# Patient Record
Sex: Male | Born: 1955 | Race: White | Hispanic: No | Smoking: Never smoker
Health system: Southern US, Community
[De-identification: ages and names within clinical notes are randomized; demographics above are authoritative.]

## PROBLEM LIST (undated history)

## (undated) DIAGNOSIS — E785 Hyperlipidemia, unspecified: Secondary | ICD-10-CM

## (undated) DIAGNOSIS — E113599 Type 2 diabetes mellitus with proliferative diabetic retinopathy without macular edema, unspecified eye: Secondary | ICD-10-CM

## (undated) DIAGNOSIS — R531 Weakness: Secondary | ICD-10-CM

## (undated) DIAGNOSIS — C801 Malignant (primary) neoplasm, unspecified: Secondary | ICD-10-CM

## (undated) DIAGNOSIS — R569 Unspecified convulsions: Secondary | ICD-10-CM

## (undated) DIAGNOSIS — I10 Essential (primary) hypertension: Secondary | ICD-10-CM

## (undated) DIAGNOSIS — M1990 Unspecified osteoarthritis, unspecified site: Secondary | ICD-10-CM

## (undated) DIAGNOSIS — C140 Malignant neoplasm of pharynx, unspecified: Secondary | ICD-10-CM

## (undated) DIAGNOSIS — I639 Cerebral infarction, unspecified: Secondary | ICD-10-CM

## (undated) DIAGNOSIS — I4891 Unspecified atrial fibrillation: Secondary | ICD-10-CM

## (undated) HISTORY — DX: Unspecified atrial fibrillation: I48.91

## (undated) HISTORY — DX: Cerebral infarction, unspecified: I63.9

## (undated) HISTORY — DX: Type 2 diabetes mellitus with proliferative diabetic retinopathy without macular edema, unspecified eye: E11.3599

## (undated) HISTORY — DX: Weakness: R53.1

## (undated) HISTORY — PX: COLONOSCOPY: SHX174

## (undated) HISTORY — DX: Unspecified convulsions: R56.9

## (undated) HISTORY — DX: Malignant (primary) neoplasm, unspecified: C80.1

## (undated) HISTORY — DX: Unspecified osteoarthritis, unspecified site: M19.90

## (undated) HISTORY — DX: Malignant neoplasm of pharynx, unspecified: C14.0

## (undated) HISTORY — DX: Hyperlipidemia, unspecified: E78.5

## (undated) HISTORY — DX: Essential (primary) hypertension: I10

## (undated) HISTORY — PX: TONSILLECTOMY: SUR1361

## (undated) HISTORY — PX: OTHER SURGICAL HISTORY: SHX169

---

## 2003-06-23 ENCOUNTER — Emergency Department (HOSPITAL_COMMUNITY): Admission: EM | Admit: 2003-06-23 | Discharge: 2003-06-23 | Payer: Self-pay | Admitting: Emergency Medicine

## 2003-06-25 ENCOUNTER — Emergency Department (HOSPITAL_COMMUNITY): Admission: EM | Admit: 2003-06-25 | Discharge: 2003-06-25 | Payer: Self-pay | Admitting: Emergency Medicine

## 2008-02-26 ENCOUNTER — Emergency Department (HOSPITAL_COMMUNITY): Admission: EM | Admit: 2008-02-26 | Discharge: 2008-02-26 | Payer: Self-pay | Admitting: Emergency Medicine

## 2009-12-09 ENCOUNTER — Inpatient Hospital Stay (HOSPITAL_COMMUNITY): Admission: EM | Admit: 2009-12-09 | Discharge: 2009-12-20 | Payer: Self-pay | Admitting: Emergency Medicine

## 2009-12-09 ENCOUNTER — Emergency Department (HOSPITAL_COMMUNITY): Admission: EM | Admit: 2009-12-09 | Discharge: 2009-12-09 | Payer: Self-pay | Admitting: Family Medicine

## 2009-12-09 ENCOUNTER — Ambulatory Visit: Payer: Self-pay | Admitting: Cardiology

## 2009-12-09 ENCOUNTER — Ambulatory Visit: Payer: Self-pay | Admitting: Internal Medicine

## 2009-12-10 ENCOUNTER — Encounter (INDEPENDENT_AMBULATORY_CARE_PROVIDER_SITE_OTHER): Payer: Self-pay | Admitting: Neurology

## 2009-12-10 ENCOUNTER — Encounter: Payer: Self-pay | Admitting: Internal Medicine

## 2009-12-11 ENCOUNTER — Encounter: Payer: Self-pay | Admitting: Internal Medicine

## 2009-12-12 ENCOUNTER — Encounter (INDEPENDENT_AMBULATORY_CARE_PROVIDER_SITE_OTHER): Payer: Self-pay | Admitting: Neurology

## 2009-12-12 ENCOUNTER — Ambulatory Visit: Payer: Self-pay | Admitting: Surgery

## 2009-12-20 ENCOUNTER — Encounter: Payer: Self-pay | Admitting: Internal Medicine

## 2009-12-20 DIAGNOSIS — E785 Hyperlipidemia, unspecified: Secondary | ICD-10-CM | POA: Insufficient documentation

## 2009-12-20 DIAGNOSIS — I6350 Cerebral infarction due to unspecified occlusion or stenosis of unspecified cerebral artery: Secondary | ICD-10-CM | POA: Insufficient documentation

## 2009-12-20 DIAGNOSIS — I10 Essential (primary) hypertension: Secondary | ICD-10-CM | POA: Insufficient documentation

## 2009-12-20 DIAGNOSIS — I4891 Unspecified atrial fibrillation: Secondary | ICD-10-CM | POA: Insufficient documentation

## 2009-12-20 DIAGNOSIS — E119 Type 2 diabetes mellitus without complications: Secondary | ICD-10-CM | POA: Insufficient documentation

## 2009-12-20 DIAGNOSIS — I5020 Unspecified systolic (congestive) heart failure: Secondary | ICD-10-CM | POA: Insufficient documentation

## 2009-12-22 ENCOUNTER — Telehealth: Payer: Self-pay | Admitting: Internal Medicine

## 2009-12-22 ENCOUNTER — Encounter: Payer: Self-pay | Admitting: Internal Medicine

## 2009-12-22 ENCOUNTER — Ambulatory Visit: Payer: Self-pay | Admitting: Internal Medicine

## 2009-12-22 ENCOUNTER — Encounter: Payer: Self-pay | Admitting: Pharmacist

## 2009-12-22 LAB — CONVERTED CEMR LAB
Blood Glucose, Fingerstick: 145
INR: 2.3

## 2009-12-26 ENCOUNTER — Ambulatory Visit: Payer: Self-pay | Admitting: Internal Medicine

## 2009-12-26 LAB — CONVERTED CEMR LAB: INR: 2.7

## 2009-12-29 ENCOUNTER — Ambulatory Visit: Payer: Self-pay | Admitting: Cardiology

## 2010-01-02 ENCOUNTER — Ambulatory Visit: Payer: Self-pay | Admitting: Internal Medicine

## 2010-01-02 ENCOUNTER — Telehealth: Payer: Self-pay | Admitting: *Deleted

## 2010-01-02 ENCOUNTER — Telehealth: Payer: Self-pay | Admitting: Cardiology

## 2010-01-02 LAB — CONVERTED CEMR LAB: INR: 3.5

## 2010-01-03 ENCOUNTER — Ambulatory Visit: Payer: Self-pay | Admitting: Internal Medicine

## 2010-01-03 LAB — CONVERTED CEMR LAB
Blood Glucose, Fingerstick: 282
Hgb A1c MFr Bld: 11.1 %

## 2010-01-03 LAB — HM DIABETES FOOT EXAM

## 2010-01-04 ENCOUNTER — Telehealth: Payer: Self-pay | Admitting: *Deleted

## 2010-01-04 LAB — CONVERTED CEMR LAB
ALT: 33 units/L (ref 0–53)
AST: 22 units/L (ref 0–37)
Albumin: 4.5 g/dL (ref 3.5–5.2)
Alkaline Phosphatase: 70 units/L (ref 39–117)
BUN: 66 mg/dL — ABNORMAL HIGH (ref 6–23)
CO2: 22 meq/L (ref 19–32)
Calcium: 9.5 mg/dL (ref 8.4–10.5)
Chloride: 98 meq/L (ref 96–112)
Creatinine, Ser: 2.64 mg/dL — ABNORMAL HIGH (ref 0.40–1.50)
Glucose, Bld: 258 mg/dL — ABNORMAL HIGH (ref 70–99)
Potassium: 5.5 meq/L — ABNORMAL HIGH (ref 3.5–5.3)
Sodium: 135 meq/L (ref 135–145)
Total Bilirubin: 0.5 mg/dL (ref 0.3–1.2)
Total Protein: 6.8 g/dL (ref 6.0–8.3)

## 2010-01-05 ENCOUNTER — Ambulatory Visit: Payer: Self-pay | Admitting: Internal Medicine

## 2010-01-05 ENCOUNTER — Ambulatory Visit (HOSPITAL_COMMUNITY): Admission: RE | Admit: 2010-01-05 | Discharge: 2010-01-05 | Payer: Self-pay | Admitting: Internal Medicine

## 2010-01-05 DIAGNOSIS — E875 Hyperkalemia: Secondary | ICD-10-CM | POA: Insufficient documentation

## 2010-01-05 DIAGNOSIS — N179 Acute kidney failure, unspecified: Secondary | ICD-10-CM | POA: Insufficient documentation

## 2010-01-05 DIAGNOSIS — N189 Chronic kidney disease, unspecified: Secondary | ICD-10-CM | POA: Insufficient documentation

## 2010-01-05 LAB — CONVERTED CEMR LAB
BUN: 54 mg/dL — ABNORMAL HIGH (ref 6–23)
CO2: 27 meq/L (ref 19–32)
Calcium: 9.3 mg/dL (ref 8.4–10.5)
Chloride: 100 meq/L (ref 96–112)
Creatinine, Ser: 2.07 mg/dL — ABNORMAL HIGH (ref 0.40–1.50)
Glucose, Bld: 246 mg/dL — ABNORMAL HIGH (ref 70–99)
INR: 1.9
Potassium: 4.5 meq/L (ref 3.5–5.3)
Sodium: 136 meq/L (ref 135–145)

## 2010-01-09 ENCOUNTER — Ambulatory Visit: Payer: Self-pay | Admitting: Internal Medicine

## 2010-01-09 LAB — CONVERTED CEMR LAB
BUN: 32 mg/dL — ABNORMAL HIGH (ref 6–23)
CO2: 26 meq/L (ref 19–32)
Calcium: 9.6 mg/dL (ref 8.4–10.5)
Chloride: 99 meq/L (ref 96–112)
Creatinine, Ser: 1.74 mg/dL — ABNORMAL HIGH (ref 0.40–1.50)
Glucose, Bld: 476 mg/dL — ABNORMAL HIGH (ref 70–99)
INR: 3
Potassium: 5.4 meq/L — ABNORMAL HIGH (ref 3.5–5.3)
Sodium: 133 meq/L — ABNORMAL LOW (ref 135–145)

## 2010-01-16 ENCOUNTER — Ambulatory Visit: Payer: Self-pay | Admitting: Internal Medicine

## 2010-01-16 ENCOUNTER — Encounter: Payer: Self-pay | Admitting: Internal Medicine

## 2010-01-16 LAB — CONVERTED CEMR LAB: INR: 2.1

## 2010-01-17 ENCOUNTER — Encounter: Payer: Self-pay | Admitting: Internal Medicine

## 2010-01-17 LAB — CONVERTED CEMR LAB
BUN: 22 mg/dL (ref 6–23)
CO2: 28 meq/L (ref 19–32)
Calcium: 9.5 mg/dL (ref 8.4–10.5)
Chloride: 98 meq/L (ref 96–112)
Creatinine, Ser: 1.35 mg/dL (ref 0.40–1.50)
Glucose, Bld: 288 mg/dL — ABNORMAL HIGH (ref 70–99)
Potassium: 4.5 meq/L (ref 3.5–5.3)
Sodium: 136 meq/L (ref 135–145)

## 2010-01-20 ENCOUNTER — Telehealth: Payer: Self-pay | Admitting: Internal Medicine

## 2010-01-23 ENCOUNTER — Ambulatory Visit: Payer: Self-pay

## 2010-01-24 ENCOUNTER — Encounter: Payer: Self-pay | Admitting: Internal Medicine

## 2010-01-30 ENCOUNTER — Ambulatory Visit: Payer: Self-pay

## 2010-02-03 ENCOUNTER — Ambulatory Visit: Payer: Self-pay | Admitting: Cardiology

## 2010-02-06 ENCOUNTER — Ambulatory Visit: Payer: Self-pay

## 2010-02-10 ENCOUNTER — Ambulatory Visit: Payer: Self-pay | Admitting: Cardiology

## 2010-02-13 LAB — CONVERTED CEMR LAB
BUN: 30 mg/dL — ABNORMAL HIGH (ref 6–23)
CO2: 29 meq/L (ref 19–32)
Calcium: 9.3 mg/dL (ref 8.4–10.5)
Chloride: 98 meq/L (ref 96–112)
Creatinine, Ser: 1.5 mg/dL (ref 0.4–1.5)
GFR calc non Af Amer: 50.19 mL/min — ABNORMAL LOW (ref >60.00)
Glucose, Bld: 352 mg/dL — ABNORMAL HIGH (ref 70–99)
Potassium: 4.8 meq/L (ref 3.5–5.1)
Sodium: 135 meq/L (ref 135–145)

## 2010-02-19 DIAGNOSIS — I639 Cerebral infarction, unspecified: Secondary | ICD-10-CM

## 2010-02-19 HISTORY — DX: Cerebral infarction, unspecified: I63.9

## 2010-02-22 ENCOUNTER — Encounter: Payer: Self-pay | Admitting: Physician Assistant

## 2010-02-22 ENCOUNTER — Ambulatory Visit
Admission: RE | Admit: 2010-02-22 | Discharge: 2010-02-22 | Payer: Self-pay | Source: Home / Self Care | Attending: Physician Assistant | Admitting: Physician Assistant

## 2010-02-22 ENCOUNTER — Other Ambulatory Visit: Payer: Self-pay | Admitting: Physician Assistant

## 2010-02-22 LAB — BASIC METABOLIC PANEL
BUN: 28 mg/dL — ABNORMAL HIGH (ref 6–23)
CO2: 25 mEq/L (ref 19–32)
Calcium: 9 mg/dL (ref 8.4–10.5)
Chloride: 102 mEq/L (ref 96–112)
Creatinine, Ser: 1.9 mg/dL — ABNORMAL HIGH (ref 0.4–1.5)
GFR: 39.33 mL/min — ABNORMAL LOW (ref >60.00)
Glucose, Bld: 286 mg/dL — ABNORMAL HIGH (ref 70–99)
Potassium: 4.5 mEq/L (ref 3.5–5.1)
Sodium: 134 mEq/L — ABNORMAL LOW (ref 135–145)

## 2010-02-23 ENCOUNTER — Telehealth: Payer: Self-pay | Admitting: Cardiology

## 2010-02-27 ENCOUNTER — Telehealth (INDEPENDENT_AMBULATORY_CARE_PROVIDER_SITE_OTHER): Payer: Self-pay

## 2010-02-28 ENCOUNTER — Other Ambulatory Visit: Payer: Self-pay | Admitting: Cardiology

## 2010-02-28 ENCOUNTER — Encounter (HOSPITAL_COMMUNITY)
Admission: RE | Admit: 2010-02-28 | Discharge: 2010-03-03 | Payer: Self-pay | Source: Home / Self Care | Attending: Cardiology | Admitting: Cardiology

## 2010-02-28 ENCOUNTER — Encounter: Payer: Self-pay | Admitting: Cardiovascular Disease

## 2010-02-28 ENCOUNTER — Ambulatory Visit: Admission: RE | Admit: 2010-02-28 | Discharge: 2010-02-28 | Payer: Self-pay | Source: Home / Self Care

## 2010-02-28 LAB — BASIC METABOLIC PANEL
BUN: 32 mg/dL — ABNORMAL HIGH (ref 6–23)
CO2: 28 mEq/L (ref 19–32)
Calcium: 9.7 mg/dL (ref 8.4–10.5)
Chloride: 97 mEq/L (ref 96–112)
Creatinine, Ser: 1.4 mg/dL (ref 0.4–1.5)
GFR: 57.49 mL/min — ABNORMAL LOW (ref >60.00)
Glucose, Bld: 129 mg/dL — ABNORMAL HIGH (ref 70–99)
Potassium: 4.9 mEq/L (ref 3.5–5.1)
Sodium: 135 mEq/L (ref 135–145)

## 2010-03-02 ENCOUNTER — Encounter (INDEPENDENT_AMBULATORY_CARE_PROVIDER_SITE_OTHER): Payer: Self-pay | Admitting: *Deleted

## 2010-03-11 DIAGNOSIS — I4891 Unspecified atrial fibrillation: Secondary | ICD-10-CM

## 2010-03-11 DIAGNOSIS — I6350 Cerebral infarction due to unspecified occlusion or stenosis of unspecified cerebral artery: Secondary | ICD-10-CM

## 2010-03-11 DIAGNOSIS — Z7901 Long term (current) use of anticoagulants: Secondary | ICD-10-CM | POA: Insufficient documentation

## 2010-03-13 ENCOUNTER — Ambulatory Visit: Admission: RE | Admit: 2010-03-13 | Discharge: 2010-03-13 | Payer: Self-pay | Source: Home / Self Care

## 2010-03-13 LAB — CONVERTED CEMR LAB: INR: 1

## 2010-03-21 NOTE — Assessment & Plan Note (Signed)
Summary: diabetes/dmr  CBG Result 145 Comments this blood sugar is nearly fasting as he skipped lunch MEDICAL NUTRITION THERAPY start time: 3:30PM end time: 4:37 PM Assessment:patient is new to our practice and recently discharged form hospitalization for a stroke. He has had no diabvetes training, but verbalizes some knowledge of how to care for diabetes. Has friend who is a nurse who helped him learn to use his meter and he also thinks is willing to predraw insulin weekly if needed. Patient reports that his mother cooks his meals- mostly baked or broiled foods, lot's of vegetables, he is able to identify some carbs- starches and sweets.   [id number] per patient Medications:required lantus and novolog in hospital, has tolerated metfomrin in past  Blood sugars:reports 140s this morning, 145 at today's visit. unable to find A1C in echart, presented with blood sugar of 307 initially in hospital   24 hour recall: will do at next visit Exercise: will cover at next future meeting Labs:will reveiw when available  Diagnosis:  NB 1.1 - Food & Nutrition related knowledge deifict as related to diabetes diagnosis and need to control blood sugars as evidenced by patient quesions and report of having no prior knowledge  NB 1.4- self monitoring deifict as related to no prior knowledge  as evidenced by patient report.   Intervention:  1- Educated about insulin today per physician request/ patient unable to use vial and syringe due to stroke. can use insulin pen or have someone predraw insuln and he can do injection 2- Educated patient on basics of self monitoring  3- Educated on basics of meal planning 4- Discussed Medicare B coverage of diabetes supplies  Monitoring: brings meter to visits,  Evaluation:A1C, blood pressure, weight  Follow-up:1 week per patient    Complete Medication List: 1)  Aspirin 81 Mg Tbec (Aspirin) .... Take 1 tablet by mouth once a day 2)  Amiodarone Hcl 200 Mg Tabs  (Amiodarone hcl) .Marland Kitchen.. 1 tab by mouth twice daily for 2 weeks, then continue on 1 tab by mouth once daily 3)  Carvedilol 12.5 Mg Tabs (Carvedilol) .... Take 1 tablet by mouth two times a day 4)  Furosemide 40 Mg Tabs (Furosemide) .... Take 1 tablet by mouth once a day 5)  Lisinopril 40 Mg Tabs (Lisinopril) .... Take 1 tablet by mouth two times a day 6)  Pravastatin Sodium 40 Mg Tabs (Pravastatin sodium) .... Take 1 tablet by mouth once a day 7)  Spironolactone 25 Mg Tabs (Spironolactone) .... Take 1 tablet by mouth once a day 8)  Amlodipine Besylate 5 Mg Tabs (Amlodipine besylate) .... Take 1 tablet by mouth once a day 9)  Warfarin Sodium 5 Mg Tabs (Warfarin sodium) .... Take 2 tablets by mouth once daily on 11/1 and 11/2. then take as directed by dr. Elie Confer, after you are evaluated at the coumadin clinic. 10)  Novofine 32g X 6 Mm Misc (Insulin pen needle) .... Use with lantus and novolog flexpen, as directed. 11)  Truetrack Blood Glucose W/device Kit (Blood glucose monitoring suppl) .... Check blood glucose before each meal and hs 12)  Truetrack Test Strp (Glucose blood) .... Check blood glucose before each meal and hs 13)  Truetrack Glucose Control Low Liqd (Blood glucose calibration) .... Use as directed 14)  Metformin Hcl 500 Mg Tabs (Metformin hcl) .... Take 1 tablet by mouth two times a day  Other Orders: MNT/Initial Visit and Intervention, 15 minutes [id number])   Orders Added: 1)  MNT/Initial Visit and Intervention, 15 minutes [  [id number]] 

## 2010-03-21 NOTE — Assessment & Plan Note (Signed)
Summary: COU/CH  Anticoagulant Therapy Managed by: Dorene Grebe. Brian [name]  PharmD [place] Attending: Lynnae January MD, Benjamine Mola Indication 1: Atrial fibrillation Indication 2: Encounter for therapeutic drug monitoring  V58.83 Start date: 12/16/2009  Patient Assessment Reviewed by: Paulla Dolly PharmD  December 26, 2009 Medication review: verified warfarin dosage & schedule,verified previous prescription medications, verified doses & any changes, verified new medications, reviewed OTC medications, reviewed OTC health products-vitamins supplements etc Complications: none Dietary changes: none   Health status changes: none   Lifestyle changes: none   Recent/future hospitalizations: none   Recent/future procedures: none   Recent/future dental: none Patient Assessment Part 2:  Have you MISSED ANY DOSES or CHANGED TABLETS?  No missed Warfarin doses or changed tablets.  Have you had any BRUISING or BLEEDING ( nose or gum bleeds,blood in urine or stool)?  No reported bruising or bleeding in nose, gums, urine, stool.  Have you STARTED or STOPPED any MEDICATIONS, including OTC meds,herbals or supplements?  No other medications or herbal supplements were started or stopped.  Have you CHANGED your DIET, especially green vegetables,or ALCOHOL intake?  No changes in diet or alcohol intake.  Have you had any ILLNESSES or HOSPITALIZATIONS?  No reported illnesses or hospitalizations  Have you had any signs of CLOTTING?(chest discomfort,dizziness,shortness of breath,arms tingling,slurred speech,swelling or redness in leg)    No chest discomfort, dizziness, shortness of breath, tingling in arm, slurred speech, swelling, or redness in leg.     Treatment  Target INR: 2.0-3.0 INR: 2.7  Date: 12/26/2009 INR reflects regimen in: 2.7  New  Tablet strength: : 5mg  Regimen Out:     Sunday: 2 Tablet     Monday: 1 & 1/2 Tablet     Tuesday: 2 Tablet     Wednesday: 1 & 1/2 Tablet     Thursday: 2 Tablet    Friday: 1 & 1/2 Tablet     Saturday: 2 Tablet Total Weekly: 62.5mg /week mg  Next INR Due: 01/02/2010 Adjusted by: Dorene Grebe. Elie Confer III PharmD CACP   Return to anticoagulation clinic:  01/02/2010 Time of next visit: 1500    Allergies: No Known Drug Allergies

## 2010-03-21 NOTE — Assessment & Plan Note (Signed)
Summary: COU/CH  Anticoagulant Therapy Managed by: Dorene Grebe. Brian [name]  PharmD CACP PCP: [place] Attending: Lynnae January MD, [place] Indication 1: Atrial fibrillation Indication 2: Encounter for therapeutic drug monitoring  V58.83 Start date: 12/16/2009  Patient Assessment Reviewed by: Paulla Dolly PharmD  January 02, 2010 Medication review: verified warfarin dosage & schedule,verified previous prescription medications, verified doses & any changes, verified new medications, reviewed OTC medications, reviewed OTC health products-vitamins supplements etc Complications: none Dietary changes: none   Health status changes: none   Lifestyle changes: none   Recent/future hospitalizations: none   Recent/future procedures: none   Recent/future dental: none Patient Assessment Part 2:  Have you MISSED ANY DOSES or CHANGED TABLETS?  No missed Warfarin doses or changed tablets.  Have you had any BRUISING or BLEEDING ( nose or gum bleeds,blood in urine or stool)?  No reported bruising or bleeding in nose, gums, urine, stool.  Have you STARTED or STOPPED any MEDICATIONS, including OTC meds,herbals or supplements?  No other medications or herbal supplements were started or stopped.  Have you CHANGED your DIET, especially green vegetables,or ALCOHOL intake?  No changes in diet or alcohol intake.  Have you had any ILLNESSES or HOSPITALIZATIONS?  No reported illnesses or hospitalizations  Have you had any signs of CLOTTING?(chest discomfort,dizziness,shortness of breath,arms tingling,slurred speech,swelling or redness in leg)    No chest discomfort, dizziness, shortness of breath, tingling in arm, slurred speech, swelling, or redness in leg.     Treatment  Target INR: 2.0-3.0 INR: 3.5  Date: 01/02/2010 Regimen In:  62.5mg /week INR reflects regimen in: 3.5  New  Tablet strength: : 5mg  Regimen Out:     Sunday: 1 & 1/2 Tablet     Monday: 2 Tablet     Tuesday: 1 & 1/2  Tablet     Wednesday: 1 & 1/2 Tablet     Thursday: 2 Tablet      Friday: 1 & 1/2 Tablet     Saturday: 1 & 1/2 Tablet Total Weekly: 57.5mg /week mg  Next INR Due: 01/23/2010 Adjusted by: Dorene Grebe. Elie Confer III PharmD CACP   Return to anticoagulation clinic:  01/23/2010 Time of next visit: [id number]    Allergies: 1)  ! Codeine

## 2010-03-21 NOTE — Assessment & Plan Note (Signed)
Summary: DM TEACHING/CH   Vital Signs:  Patient profile:   55 year old male Height:      70 inches Weight:      197.2 pounds BMI:     28.40  Allergies: No Known Drug Allergies  MEDICAL NUTRITION THERAPY  Assessment: Patient is now taking metformin as orderd, chekcing blood sugar once daily- reports fasting blood sigar todaywas 103mg /dl. Eats heathy and wants to incorporte more fruits. skipped lunch again today. Does not get regular exercise.   [id number] per patient Medications: tolerating metfomrin with brealfast and lunch, suggested taking it with breakfast and dinner Blood sugars:reports 103s this morning 24 hour recall: will do at next visit Exercise: not doing concistently Labs:will reveiw when available  Diagnosis:  NB 1.1 - Food & Nutrition related knowledge deifict improved and needs reinforced  NB 1.4- self monitoring deifict improving, patient needs A1C and to remember to bring meter to all clinic appoitnments   Intervention:  1- Educated about guidelines for exercise and discussed ways to work that in 2-  Used goal setting with patient to assist with self identifying changes he can make to his food choices  3- Educated about standards of care for eye and foot care.  Monitoring: brings meter to visits,  Evaluation:A1C, blood pressure, weight  Follow-up:1 month    Complete Medication List:   Complete Medication List: 1)  Aspirin 81 Mg Tbec (Aspirin) .... Take 1 tablet by mouth once a day 2)  Amiodarone Hcl 200 Mg Tabs (Amiodarone hcl) .Marland Kitchen.. 1 tab by mouth twice daily for 2 weeks, then continue on 1 tab by mouth once daily 3)  Carvedilol 12.5 Mg Tabs (Carvedilol) .... Take 1 tablet by mouth two times a day 4)  Furosemide 40 Mg Tabs (Furosemide) .... Take 1 tablet by mouth once a day 5)  Lisinopril 40 Mg Tabs (Lisinopril) .... Take 1 tablet by mouth two times a day 6)  Pravastatin Sodium 40 Mg Tabs (Pravastatin sodium) .... Take 1 tablet by mouth once a  day 7)  Spironolactone 25 Mg Tabs (Spironolactone) .... Take 1 tablet by mouth once a day 8)  Amlodipine Besylate 5 Mg Tabs (Amlodipine besylate) .... Take 1 tablet by mouth once a day 9)  Warfarin Sodium 5 Mg Tabs (Warfarin sodium) .... Take 2 tablets by mouth once daily on 11/1 and 11/2. then take as directed by dr. Elie Confer, after you are evaluated at the coumadin clinic. 10)  Novofine 32g X 6 Mm Misc (Insulin pen needle) .... Use with lantus and novolog flexpen, as directed. 11)  Truetrack Blood Glucose W/device Kit (Blood glucose monitoring suppl) .... Check blood glucose before each meal and hs 12)  Truetrack Test Strp (Glucose blood) .... Check blood glucose before each meal and hs 13)  Truetrack Glucose Control Low Liqd (Blood glucose calibration) .... Use as directed 14)  Metformin Hcl 500 Mg Tabs (Metformin hcl) .... Take 1 tablet by mouth two times a day  Other Orders: MNT/Reassessment and Intervention, Individual, 15 Minutes [id number])   Orders Added: 1)  MNT/Reassessment and Intervention, Individual, 15 Minutes [phone]

## 2010-03-21 NOTE — Assessment & Plan Note (Signed)
Summary: PT COMING AT 2 PM/DS AND MD APPT  Anticoagulant Therapy Managed by: Dorene Grebe. Ceasar Lund  PharmD CACP PCP: [place] Attending: Verneda Skill MD Indication 1: Atrial fibrillation Indication 2: Encounter for therapeutic drug monitoring  V58.83 Start date: 12/16/2009  Patient Assessment Reviewed by: Paulla Dolly PharmD  January 05, 2010 Medication review: verified warfarin dosage & schedule,verified previous prescription medications, verified doses & any changes, verified new medications, reviewed OTC medications, reviewed OTC health products-vitamins supplements etc Complications: none Dietary changes: none   Health status changes: none   Lifestyle changes: none   Recent/future hospitalizations: none   Recent/future procedures: none   Recent/future dental: none Patient Assessment Part 2:  Have you MISSED ANY DOSES or CHANGED TABLETS?  No missed Warfarin doses or changed tablets.  Have you had any BRUISING or BLEEDING ( nose or gum bleeds,blood in urine or stool)?  No reported bruising or bleeding in nose, gums, urine, stool.  Have you STARTED or STOPPED any MEDICATIONS, including OTC meds,herbals or supplements?  No other medications or herbal supplements were started or stopped.  Have you CHANGED your DIET, especially green vegetables,or ALCOHOL intake?  No changes in diet or alcohol intake.  Have you had any ILLNESSES or HOSPITALIZATIONS?  No reported illnesses or hospitalizations  Have you had any signs of CLOTTING?(chest discomfort,dizziness,shortness of breath,arms tingling,slurred speech,swelling or redness in leg)    No chest discomfort, dizziness, shortness of breath, tingling in arm, slurred speech, swelling, or redness in leg.     Treatment  Target INR: 2.0-3.0 INR: 1.9  Date: 01/05/2010 Regimen In:  57.5mg /week INR reflects regimen in: 1.9  New  Tablet strength: : 5mg  Regimen Out:     Sunday: 1 & 1/2 Tablet     Monday: 2 Tablet     Tuesday: 1 & 1/2 Tablet     Wednesday: 2 Tablet     Thursday: 1 & 1/2 Tablet      Friday: 2 Tablet     Saturday: 1 & 1/2 Tablet Total Weekly: 60.0mg /week mg  Next INR Due: 01/09/2010 Adjusted by: Dorene Grebe. Elie Confer III PharmD CACP   Return to anticoagulation clinic:  01/09/2010 Time of next visit: 1000    Allergies: 1)  ! Codeine

## 2010-03-21 NOTE — Progress Notes (Signed)
  Phone Note Outgoing Call   Call placed by: [name], D. Jenetta Downer Summary of Call: This afternoon, I spoke with Dr. Elie Confer, who indicated that Mr. Sybrant had missed his [facility] Clinic appt at 11:00AM. Therefore, I placed  a call to Mr. Credit, who states he simply forgot about the appointment. I was able to arrange with Dr. Elie Confer (very kindly) an appointment today at 3:00Pm - which Mr. Legge states he will be able to attend.  During our discussion, Mr. Doehring also mentioned that he has been unable to afford his Lantus and Novolog because not covered by his insurance. I called his [place] on [employer], who indicate that he has medicare part A, and therefore, no drug coverage is available. The patient had already left for his Coumadin Clinic appointment by the time I was able to get this information.  I also spoke with Barnabas Harries, who very kindly agreed to fit Mr. Schoeff in for Diabetes Education, education on syringes and needles (with thoughts of likely need for insulin soon), and diabetic diet. This was explained to the patient. Butch Penny will forward message of statin and metformin to patient. Upon follow-up appt on 01/03/10, we can readdress insulin needs.

## 2010-03-21 NOTE — Miscellaneous (Signed)
Summary: Orders Update   Clinical Lists Changes  Medications: Changed medication from CRESTOR 20 MG TABS (ROSUVASTATIN CALCIUM) Take 1 tablet by mouth once a night to PRAVASTATIN SODIUM 40 MG TABS (PRAVASTATIN SODIUM) Take 1 tablet by mouth once a day - Signed Added new medication of METFORMIN HCL 500 MG TABS (METFORMIN HCL) Take 1 tablet by mouth two times a day - Signed Removed medication of NOVOLOG FLEXPEN 100 UNIT/ML SOLN (INSULIN ASPART) Inject 5 units subcutaneously 30 minutes before each meal - Signed Removed medication of LANTUS SOLOSTAR 100 UNIT/ML SOLN (INSULIN GLARGINE) Inject 14 units subcutaneously each night - Signed Rx of PRAVASTATIN SODIUM 40 MG TABS (PRAVASTATIN SODIUM) Take 1 tablet by mouth once a day;  #30 x 5;  Signed;  Entered by: [name] DO;  Authorized by: [name] DO;  Method used: Electronically to [facility] [phone]*, [address] [place], [place]  [address], Ph: [phone], Fax: [phone] Rx of METFORMIN HCL 500 MG TABS (METFORMIN HCL) Take 1 tablet by mouth two times a day;  #30 x 5;  Signed;  Entered by: [name] DO;  Authorized by: [name] DO;  Method used: Electronically to [facility] [phone]*, [address], [place], [place]  [address], Ph: [phone], Fax: [phone]    Prescriptions: METFORMIN HCL 500 MG TABS (METFORMIN HCL) Take 1 tablet by mouth two times a day  #30 x 5   Entered and Authorized by:   [name] DO   Signed by:   [name] DO on 12/22/2009   Method used:   Electronically to        [facility] [phone]* (retail)       [place], [place]  [address]       Ph: [phone]       Fax: [phone]   RxIDHS:[id number] PRAVASTATIN SODIUM 40 MG TABS (PRAVASTATIN SODIUM) Take 1 tablet by mouth once a day  #30 x 5   Entered and Authorized by:   [name] DO   Signed by:   [name] DO on 12/22/2009   Method used:   Electronically to        [facility] [phone]* (retail)       [address]       [place], [place]  [address]       Ph: [phone]       Fax: [phone]   RxID:   [id number]

## 2010-03-21 NOTE — Assessment & Plan Note (Signed)
Summary: Coumadin Clinic  Anticoagulant Therapy Managed by: Dorene Grebe. Ceasar Lund  PharmD [place] Attending: Verneda Skill MD Indication 1: Atrial fibrillation Indication 2: Encounter for therapeutic drug monitoring  V58.83 Start date: 12/16/2009  Patient Assessment Reviewed by: Paulla Dolly PharmD  December 22, 2009 Medication review: verified warfarin dosage & schedule,verified previous prescription medications, verified doses & any changes, verified new medications, reviewed OTC medications, reviewed OTC health products-vitamins supplements etc Complications: none Dietary changes: none   Health status changes: none   Lifestyle changes: none   Recent/future hospitalizations: yes  Details: Yes. Admitted to hospital on 28-Oct-11---See admission note. Recent/future procedures: none   Recent/future dental: none Patient Assessment Part 2:  Have you MISSED ANY DOSES or CHANGED TABLETS?  No missed Warfarin doses or changed tablets.  Have you had any BRUISING or BLEEDING ( nose or gum bleeds,blood in urine or stool)?  No reported bruising or bleeding in nose, gums, urine, stool.  Have you STARTED or STOPPED any MEDICATIONS, including OTC meds,herbals or supplements?  No other medications or herbal supplements were started or stopped.  Have you CHANGED your DIET, especially green vegetables,or ALCOHOL intake?  No changes in diet or alcohol intake.  Have you had any ILLNESSES or HOSPITALIZATIONS?  No reported illnesses or hospitalizations  Have you had any signs of CLOTTING?(chest discomfort,dizziness,shortness of breath,arms tingling,slurred speech,swelling or redness in leg)    No chest discomfort, dizziness, shortness of breath, tingling in arm, slurred speech, swelling, or redness in leg.     Treatment  Target INR: 2.0-3.0 INR: 2.3  Date: 12/22/2009 INR reflects regimen in: 2.3  New  Tablet strength: : 5mg  Regimen Out:     Sunday: 1 & 1/2 Tablet       Friday: 2 Tablet  Saturday: 2 Tablet  Next INR Due: 12/26/2009 Adjusted by: Dorene Grebe. Elie Confer III PharmD CACP   Return to anticoagulation clinic:  12/26/2009 Time of next visit: 1500

## 2010-03-21 NOTE — Discharge Summary (Signed)
Summary: Hospital Discharge Update    Hospital Discharge Update:  Date of Admission: 12/09/2009 Date of Discharge: 12/20/2009  Brief Summary:  Pt is a 55yo male with a PMHx of uncontrolled HTN, DMII who presented to the [facility] ED via EMS from [facility] where he was found to have a blood pressure of 250/150. Patient had presented to urgent care for evaluation of shortness of breath and palpitations that he had experienced for approximately 1 week. Once transported to [facility], he was found to be in Afib with RVR, and the ED started him on a Diltiazem and Nitro drip, which reduced his blood pressure to SBPs of 130. 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 During the rest of the hospital course, efforts were made towards allowing coumadin levels to become therapeutic, and further blood pressure and diabetes management. Patient discharged on 12/20/09 with Close follow-up with Dr. Elie Confer in [facility] and [place].  Labs needed at follow-up: Comprehensive metabolic panel  Problem list changes:  Added new problem of DIABETES MELLITUS, TYPE II, UNCONTROLLED (ICD-250.02) - Signed Added new problem of ATRIAL FIBRILLATION WITH RAPID VENTRICULAR RESPONSE (ICD-427.31) - Signed Added new problem of ESSENTIAL HYPERTENSION, BENIGN (ICD-401.1) - Signed Added new problem of HYPERLIPIDEMIA (ICD-272.4) - Signed Added new problem of CEREBROVASCULAR ACCIDENT (ICD-434.91) - Signed Added new problem of CONGESTIVE HEART FAILURE, SYSTOLIC DYSFUNCTION (A999333) - Signed  Medication list changes:  Added new medication of ASPIRIN 81 MG TBEC (ASPIRIN) Take 1 tablet by mouth once a day - Signed Added new medication of LANTUS SOLOSTAR 100 UNIT/ML SOLN (INSULIN GLARGINE) Inject 14 units subcutaneously each night - Signed Added new medication of AMIODARONE HCL 200 MG TABS (AMIODARONE HCL) 1 tab  by mouth twice daily for 2 weeks, then continue on 1 tab by mouth once daily - Signed Added new medication of CARVEDILOL 12.5 MG TABS (CARVEDILOL) Take 1 tablet by mouth two times a day - Signed Added new medication of FUROSEMIDE 40 MG TABS (FUROSEMIDE) Take 1 tablet by mouth once a day - Signed Added new medication of LISINOPRIL 40 MG TABS (LISINOPRIL) Take 1 tablet by mouth two times a day - Signed Added new medication of CRESTOR 20 MG TABS (ROSUVASTATIN CALCIUM) Take 1 tablet by mouth once a night - Signed Added new medication of SPIRONOLACTONE 25 MG TABS (SPIRONOLACTONE) Take 1 tablet by mouth once a day - Signed Added new medication of AMLODIPINE BESYLATE 5 MG TABS (AMLODIPINE BESYLATE) Take 1 tablet by mouth once a day - Signed Added new medication of WARFARIN SODIUM 5 MG TABS (WARFARIN SODIUM) take 2 tablets by mouth once daily on 11/1 and 11/2. Then take as directed by Dr. Elie Confer, after you are evaluated at the [facility]. - Signed Added new medication of NOVOLOG FLEXPEN 100 UNIT/ML SOLN (INSULIN ASPART) Inject 5 units subcutaneously 30 minutes before each meal - Signed Added new medication of NOVOFINE 32G X 6 MM MISC (INSULIN PEN NEEDLE) Use with Lantus and Novolog Flexpen, as directed. - Signed Added new medication of TRUETRACK BLOOD GLUCOSE W/DEVICE KIT (BLOOD GLUCOSE MONITORING SUPPL) Check blood glucose before each meal and hs - Signed Added new medication of TRUETRACK TEST  STRP (GLUCOSE BLOOD) Check blood glucose before each meal and hs - Signed Added new medication of TRUETRACK GLUCOSE CONTROL LOW LIQD (BLOOD GLUCOSE CALIBRATION) Use as directed - Signed Rx of ASPIRIN 81 MG TBEC (  ASPIRIN) Take 1 tablet by mouth once a day;  #30 x 5;  Signed;  Entered by: [name] DO;  Authorized by: [name] DO;  Method used: Print then Give to Patient Rx of LANTUS SOLOSTAR 100 UNIT/ML SOLN (INSULIN GLARGINE) Inject 14 units subcutaneously each night;  #1 box x 3;  Signed;   Entered by: [name] DO;  Authorized by: [name] DO;  Method used: Print then Give to Patient Rx of AMIODARONE HCL 200 MG TABS (AMIODARONE HCL) 1 tab by mouth twice daily for 2 weeks, then continue on 1 tab by mouth once daily;  #42 x 0;  Signed;  Entered by: [name] DO;  Authorized by: [name] DO;  Method used: Print then Give to Patient Rx of CARVEDILOL 12.5 MG TABS (CARVEDILOL) Take 1 tablet by mouth two times a day;  #60 x 5;  Signed;  Entered by: [name] DO;  Authorized by: [name] DO;  Method used: Print then Give to Patient Rx of LISINOPRIL 40 MG TABS (LISINOPRIL) Take 1 tablet by mouth two times a day;  #60 x 5;  Signed;  Entered by: [name] DO;  Authorized by: [name] DO;  Method used: Print then Give to Patient Rx of SPIRONOLACTONE 25 MG TABS (SPIRONOLACTONE) Take 1 tablet by mouth once a day;  #30 x 5;  Signed;  Entered by: [name] DO;  Authorized by: [name] DO;  Method used: Print then Give to Patient Rx of FUROSEMIDE 40 MG TABS (FUROSEMIDE) Take 1 tablet by mouth once a day;  #30 x 5;  Signed;  Entered by: [name] DO;  Authorized by: [name] DO;  Method used: Print then Give to Patient Rx of CRESTOR 20 MG TABS (ROSUVASTATIN CALCIUM) Take 1 tablet by mouth once a night;  #30 x 5;  Signed;  Entered by: [name] DO;  Authorized by: [name] DO;  Method used: Print then Give to Patient Rx of AMLODIPINE BESYLATE 5 MG TABS (AMLODIPINE BESYLATE) Take 1 tablet by mouth once a day;  #30 x 5;  Signed;  Entered by: [name] DO;  Authorized by: [name] DO;  Method used: Print then Give to Patient Rx of NOVOFINE 32G X 6 MM MISC (INSULIN PEN NEEDLE) Use with Lantus and Novolog Flexpen, as directed.;  #1 box x 11;  Signed;  Entered by: [name] DO;  Authorized by: [name] DO;  Method used: Print then Give to Patient Rx of TRUETRACK BLOOD GLUCOSE W/DEVICE KIT (BLOOD GLUCOSE MONITORING SUPPL) Check blood glucose before each meal and hs;  #1 box x 11;  Signed;  Entered by: [name] DO;  Authorized by: [name] DO;  Method used: Print then Give to Patient Rx of TRUETRACK TEST  STRP (GLUCOSE BLOOD) Check blood glucose before each meal and hs;  #1 box x 11;  Signed;  Entered by: [name] DO;  Authorized by: [name] DO;  Method used: Print then Give to Patient Rx of TRUETRACK GLUCOSE CONTROL LOW LIQD (BLOOD GLUCOSE CALIBRATION) Use as directed;  #1 x 11;  Signed;  Entered by: [name] DO;  Authorized by: [name] DO;  Method used: Print then Give to Patient Rx of NOVOLOG FLEXPEN 100 UNIT/ML SOLN (INSULIN ASPART) Inject 5 units subcutaneously 30 minutes before each meal;  #1 box x 5;  Signed;  Entered by: [name] DO;  Authorized by: [name] DO;  Method used: Print then [employer]  to Patient Rx of WARFARIN SODIUM 5 MG TABS (WARFARIN SODIUM) take 2 tablets by mouth once daily on 11/1 and 11/2. Then take as directed by Dr. Elie Confer, after you are evaluated at the [facility] Clinic.;  #60 x 1;  Signed;  Entered by: [name] DO;  Authorized by: [name] DO;  Method used: Print then Give to Patient  Discharge medications:  ASPIRIN 81 MG TBEC (ASPIRIN) Take 1 tablet by mouth once a day AMIODARONE HCL 200 MG TABS (AMIODARONE HCL) 1 tab by mouth twice daily for 2 weeks, then continue on 1 tab by mouth once daily CARVEDILOL 12.5 MG TABS (CARVEDILOL) Take 1 tablet by mouth two times a day FUROSEMIDE 40 MG TABS (FUROSEMIDE) Take 1 tablet by mouth once a day LISINOPRIL 40 MG TABS (LISINOPRIL) Take 1 tablet by mouth two times a day PRAVASTATIN SODIUM 40 MG TABS (PRAVASTATIN SODIUM) Take 1 tablet by mouth once a day SPIRONOLACTONE 25 MG TABS (SPIRONOLACTONE)  Take 1 tablet by mouth once a day AMLODIPINE BESYLATE 5 MG TABS (AMLODIPINE BESYLATE) Take 1 tablet by mouth once a day WARFARIN SODIUM 5 MG TABS (WARFARIN SODIUM) take 2 tablets by mouth once daily on 11/1 and 11/2. Then take as directed by Dr. Elie Confer, after you are evaluated at the Coumadin Clinic. NOVOFINE 32G X 6 MM MISC (INSULIN PEN NEEDLE) Use with Lantus and Novolog Flexpen, as directed. TRUETRACK BLOOD GLUCOSE W/DEVICE KIT (BLOOD GLUCOSE MONITORING SUPPL) Check blood glucose before each meal and hs TRUETRACK TEST  STRP (GLUCOSE BLOOD) Check blood glucose before each meal and hs TRUETRACK GLUCOSE CONTROL LOW LIQD (BLOOD GLUCOSE CALIBRATION) Use as directed METFORMIN HCL 500 MG TABS (METFORMIN HCL) Take 1 tablet by mouth two times a day  Other patient instructions:  Do not take alcohol when taking warfarin. If you develop severe bleeding, please call the clinic at [phone] or go to ER.  Note: Hospital Discharge Medications & Other Instructions handout was printed, one copy for patient and a second copy to be placed in hospital chart.

## 2010-03-21 NOTE — Assessment & Plan Note (Signed)
Summary: COUMADIN AND MD APPT/DS   Vital Signs:  Patient profile:   55 year old male Height:      70 inches (177.80 cm) Weight:      197.2 pounds (89.64 kg) BMI:     28.40 Temp:     97.0 degrees F (36.11 degrees C) oral Pulse rate:   62 / minute BP sitting:   139 / 83  (left arm) Cuff size:   regular  Vitals Entered By: Mateo Flow Deborra Medina) (January 05, 2010 1:43 PM) CC: f/u abn labs Is Patient Diabetic? Yes Pain Assessment Patient in pain? no      Nutritional Status BMI of 25 - 29 = overweight  Have you ever been in a relationship where you felt threatened, hurt or afraid?No   Does patient need assistance? Functional Status Self care Ambulation Normal   Primary Care Geovani Tootle:  [name] DO  CC:  f/u abn labs.  History of Present Illness: 55 y/o man with PMH significant for HTN, Type 2 DM, A fib with RVR, R parietal stroke (tPA treated) comes  for a f/u visit secondary to his abnormal labs.  His BMET yesterday(11/16)  showed K of 5.5 and Cr of 2.64, which is way high from his d/c labs K- 4.2 and Cr-1.29. He was called  yesterday and adviced not to take his evening dose of lisinopril and metformin.  Today with the clinic visit, he says that he is feeling great . Denies any C/P, SOB, palpitations, N/V/ fever/abdominal pain.   Preventive Screening-Counseling & Management  Alcohol-Tobacco     Smoking Status: never  Current Medications (verified): 1)  Aspirin 81 Mg Tbec (Aspirin) .... Take 1 Tablet By Mouth Once A Day 2)  Amiodarone Hcl 200 Mg Tabs (Amiodarone Hcl) .Marland Kitchen.. 1 Tab By Mouth Twice Daily For 2 Weeks, Then Continue On 1 Tab By Mouth Once Daily 3)  Carvedilol 25 Mg Tabs (Carvedilol) .... One Twice Day 4)  Furosemide 40 Mg Tabs (Furosemide) .... Take 1 Tablet By Mouth Once A Day 5)  Lisinopril 40 Mg Tabs (Lisinopril) .... Take 1 Tablet By Mouth Two Times A Day 6)  Pravastatin Sodium 40 Mg Tabs (Pravastatin Sodium) .... Take 1 Tablet By Mouth  Once A Day 7)  Amlodipine Besylate 10 Mg Tabs (Amlodipine Besylate) .... Take 1 Tab By Mouth Once Daily. 8)  Warfarin Sodium 5 Mg Tabs (Warfarin Sodium) .... Take 2 Tablets By Mouth Once Daily On 11/1 and 11/2. Then Take As Directed By Dr. Elie Confer, After You Are Evaluated At The [facility]madin Clinic. 9)  Humulin N 100 Unit/ml Susp (Insulin Isophane Human) .... Inject 10 Units [place] 30 Min Before Your Evening Meal. 10)  Glucotrol 5 Mg Tabs (Glipizide) .... Take 1 Tablet By Mouth Once Daily in The Morning.  Allergies (verified): 1)  ! Codeine  Review of Systems      See HPI  The patient denies anorexia, fever, chest pain, syncope, dyspnea on exertion, and peripheral edema.    Physical Exam  General:  alert, well-developed, and well-nourished.   Head:  normocephalic and atraumatic.   Eyes:  vision grossly intact, pupils equal, pupils round, and pupils reactive to light.   Mouth:  pharynx pink and moist.   Neck:  supple and full ROM.   Lungs:  normal respiratory effort, no intercostal retractions, no accessory muscle use, normal breath sounds, no dullness, no fremitus, no crackles, and no wheezes.   Heart:  normal rate, regular rhythm, no murmur, no gallop,  no rub, and no JVD.   Abdomen:  soft, non-tender, normal bowel sounds, no distention, no masses, no guarding, and no rigidity.   Msk:  normal ROM, no joint tenderness, no joint swelling, no joint warmth, and no redness over joints.   Pulses:  2+pulses b/l. Extremities:  no yanosis, clubbing or edema. Neurologic:  alert & oriented X3., residual acial droop on his left side, weak grip on the left side as compared to the right.alert & oriented X3, sensation intact to pinprick, gait normal, and DTRs symmetrical and normal.     Impression & Recommendations:  Problem # 1:  HYPERKALEMIA (ICD-276.7) Assessment New Her labs from yesterday showed K -5.5.  This is most likely from meds ie spirolactone and ACE inhibitors. - recheck his BMET today. -  order an EKG - hold spironolactone and ACE inhibitors until next visit. Orders: 12 Lead EKG (12 Lead EKG)  Problem # 2:  RENAL FAILURE, ACUTE (ICD-584.9) Assessment: New His baseline Cr is 1.2-1.3. The acute rise in creatinine is most likey from his meds ie ACE inhibitors, lasix.  Wiill hold his lasix and ACE inhibitors until his next visit.   Problem # 3:  ESSENTIAL HYPERTENSION, BENIGN (ICD-401.1) For his BP, will increase the dose of amlodipine. Will reasses his BP with the next visit. Will restart  ACE inhibitor at a low dose of 10 mg in the setting of low EF of 25-30%. Currently he is on carvedilol and amlodipine for his BP control. The following medications were removed from the medication list:    Spironolactone 25 Mg Tabs (Spironolactone) .Marland Kitchen... Take 1 tablet by mouth once a day His updated medication list for this problem includes:    Carvedilol 25 Mg Tabs (Carvedilol) ..... One twice day    Furosemide 40 Mg Tabs (Furosemide) .Marland Kitchen... Take 1 tablet by mouth once a day    Lisinopril 40 Mg Tabs (Lisinopril) .Marland Kitchen... Take 1 tablet by mouth two times a day    Amlodipine Besylate 10 Mg Tabs (Amlodipine besylate) .Marland Kitchen... Take 1 tab by mouth once daily.  Problem # 4:  CONGESTIVE HEART FAILURE, SYSTOLIC DYSFUNCTION (A999333) He doesn' t have any signs for CHF excab today. Will hold his  lasix, spironolactone, ACE inhibitors under the setting of acute renal failure and hyperkalemia. Will readjust dosage for all his meds with his next visit on Monday. The following medications were removed from the medication list:    Spironolactone 25 Mg Tabs (Spironolactone) .Marland Kitchen... Take 1 tablet by mouth once a day His updated medication list for this problem includes:    Aspirin 81 Mg Tbec (Aspirin) .Marland Kitchen... Take 1 tablet by mouth once a day    Carvedilol 25 Mg Tabs (Carvedilol) ..... One twice day    Furosemide 40 Mg Tabs (Furosemide) .Marland Kitchen... Take 1 tablet by mouth once a day    Lisinopril 40 Mg Tabs (Lisinopril)  .Marland Kitchen... Take 1 tablet by mouth two times a day    Warfarin Sodium 5 Mg Tabs (Warfarin sodium) .Marland Kitchen... Take 2 tablets by mouth once daily on 11/1 and 11/2. then take as directed by dr. Elie Confer, after you are evaluated at the coumadin clinic.  Problem # 5:  DIABETES MELLITUS, TYPE II, UNCONTROLLED (ICD-250.02) In the setting of Cr-2.64, will switch him from metformin to insulin. Will reasess his blood sugars with subsequent visits to make appropriate changes in our regimen. Will start him on 10 units of insulin today. The following medications were removed from the medication list:  Metformin Hcl 500 Mg Tabs (Metformin hcl) .Marland Kitchen... Take 2 tablets by mouth in the morning and 1 in the evening for first week, then take 2 tablets by mouth two times a day His updated medication list for this problem includes:    Aspirin 81 Mg Tbec (Aspirin) .Marland Kitchen... Take 1 tablet by mouth once a day    Lisinopril 40 Mg Tabs (Lisinopril) .Marland Kitchen... Take 1 tablet by mouth two times a day    Humulin N 100 Unit/ml Susp (Insulin isophane human) ..... Inject 10 units [place] 30 min before your evening meal.    Glucotrol 5 Mg Tabs (Glipizide) .Marland Kitchen... Take 1 tablet by mouth once daily in the morning.  Orders: T-Basic Metabolic Panel (99991111)  Problem # 6:  CEREBROVASCULAR ACCIDENT (ICD-434.91) Assessment: Improved For minor residual weakness in his left hand, PT referral was done. His updated medication list for this problem includes:    Aspirin 81 Mg Tbec (Aspirin) .Marland Kitchen... Take 1 tablet by mouth once a day    Warfarin Sodium 5 Mg Tabs (Warfarin sodium) .Marland Kitchen... Take 2 tablets by mouth once daily on 11/1 and 11/2. then take as directed by dr. Elie Confer, after you are evaluated at the coumadin clinic.  Orders: Physical Therapy Referral (PT)  Problem # 7:  ATRIAL FIBRILLATION WITH RAPID VENTRICULAR RESPONSE (ICD-427.31) Assessment: Comment Only Patient was seen by Dr. Elie Confer today for adjustments in the dose of his coumadin as per the interaction  between coumadin and amiodarone. His INR today is 1.29. His updated medication list for this problem includes:    Aspirin 81 Mg Tbec (Aspirin) .Marland Kitchen... Take 1 tablet by mouth once a day    Amiodarone Hcl 200 Mg Tabs (Amiodarone hcl) .Marland Kitchen... 1 tab by mouth twice daily for 2 weeks, then continue on 1 tab by mouth once daily    Carvedilol 25 Mg Tabs (Carvedilol) ..... One twice day    Amlodipine Besylate 10 Mg Tabs (Amlodipine besylate) .Marland Kitchen... Take 1 tab by mouth once daily.    Warfarin Sodium 5 Mg Tabs (Warfarin sodium) .Marland Kitchen... Take 2 tablets by mouth once daily on 11/1 and 11/2. then take as directed by dr. Elie Confer, after you are evaluated at the coumadin clinic.  Complete Medication List: 1)  Aspirin 81 Mg Tbec (Aspirin) .... Take 1 tablet by mouth once a day 2)  Amiodarone Hcl 200 Mg Tabs (Amiodarone hcl) .Marland Kitchen.. 1 tab by mouth twice daily for 2 weeks, then continue on 1 tab by mouth once daily 3)  Carvedilol 25 Mg Tabs (Carvedilol) .... One twice day 4)  Furosemide 40 Mg Tabs (Furosemide) .... Take 1 tablet by mouth once a day 5)  Lisinopril 40 Mg Tabs (Lisinopril) .... Take 1 tablet by mouth two times a day 6)  Pravastatin Sodium 40 Mg Tabs (Pravastatin sodium) .... Take 1 tablet by mouth once a day 7)  Amlodipine Besylate 10 Mg Tabs (Amlodipine besylate) .... Take 1 tab by mouth once daily. 8)  Warfarin Sodium 5 Mg Tabs (Warfarin sodium) .... Take 2 tablets by mouth once daily on 11/1 and 11/2. then take as directed by dr. Elie Confer, after you are evaluated at the coumadin clinic. 9)  Humulin N 100 Unit/ml Susp (Insulin isophane human) .... Inject 10 units [place] 30 min before your evening meal. 10)  Glucotrol 5 Mg Tabs (Glipizide) .... Take 1 tablet by mouth once daily in the morning.  Patient Instructions: 1)  Please schedule an appointment with Dr. [name] on Monday. 2)  Please schedule a  f/u appointment with Dr. Elie Confer on Monday. 3)  Please take your medicines as prescribed. Prescriptions: GLUCOTROL 5 MG  TABS (GLIPIZIDE) Take 1 tablet by mouth once daily in the morning.  #15 x 0   Entered and Authorized by:   Pedro Earls   Signed by:   Pedro Earls on 01/05/2010   Method used:   Print then Give to Patient   RxID:   [id number] GLUCOTROL 5 MG TABS (GLIPIZIDE) Take 1 tablet by mouth once daily in the morning.  #15 x 0   Entered and Authorized by:   Pedro Earls   Signed by:   Pedro Earls on 01/05/2010   Method used:   Print then Give to Patient   RxID:   [id number] HUMULIN N 100 UNIT/ML SUSP (INSULIN ISOPHANE HUMAN) Inject 10 units [place] 30 min before your evening meal.  #1 x 1   Entered and Authorized by:   Pedro Earls   Signed by:   Pedro Earls on 01/05/2010   Method used:   Print then Give to Patient   RxID:   [id number]    Orders Added: 1)  12 Lead EKG [12 Lead EKG] 2)  Physical Therapy Referral [PT] 3)  T-Basic Metabolic Panel 0000000 4)  Est. Patient Level IV [id number]   Process Orders Check Orders Results:     [facility]: Check successful Tests Sent for requisitioning (January 06, 2010 9:21 AM):     01/05/2010: [facility] -- T-Basic Metabolic Panel 0000000 (signed)     Prevention & Chronic Care Immunizations   Influenza vaccine: Historical  (12/11/2009)    Tetanus booster: Not documented    Pneumococcal vaccine: Historical  (12/11/2009)  Colorectal Screening   Hemoccult: Not documented    Colonoscopy: Not documented  Other Screening   PSA: Not documented   Smoking status: never  (01/05/2010)  Diabetes Mellitus   HgbA1C: 11.1  (01/03/2010)    Eye exam: Not documented    Foot exam: yes  (01/03/2010)   Foot exam action/deferral: Do today   High risk foot: Yes  (01/03/2010)   Foot care education: Done  (01/03/2010)    Urine microalbumin/creatinine ratio: Not documented  Lipids   Total Cholesterol: Not documented   LDL: Not documented   LDL Direct: Not documented   HDL: Not  documented   Triglycerides: Not documented    SGOT (AST): 22  (01/03/2010)   SGPT (ALT): 33  (01/03/2010)   Alkaline phosphatase: 70  (01/03/2010)   Total bilirubin: 0.5  (01/03/2010)  Hypertension   Last Blood Pressure: 139 / 83  (01/05/2010)   Serum creatinine: 2.64  (01/03/2010)   Serum potassium 5.5  (01/03/2010)  Self-Management Support :   Personal Goals (by the next clinic visit) :     Personal A1C goal: 7  (01/03/2010)     Personal blood pressure goal: 130/80  (01/03/2010)     Personal LDL goal: 100  (01/03/2010)    Diabetes self-management support: Copy of home glucose meter record, Written self-care plan, Education handout, Pre-printed educational material, Resources for patients handout  (01/03/2010)    Hypertension self-management support: Written self-care plan, Education handout, Resources for patients handout  (01/03/2010)    Lipid self-management support: Written self-care plan, Education handout, Resources for patients handout  (01/03/2010)

## 2010-03-21 NOTE — Progress Notes (Signed)
Summary: appt./gp  Phone Note Outgoing Call   Summary of Call: Dr. [name] wants pt. to see her and Paulla Dolly tomorrow 01/05/10 @ 2:00PM.  Pt. was called;message left on answeering machine. Initial call taken by: Morrison Old RN,  January 04, 2010 3:48 PM  Follow-up for Phone Call        Patient was called again; he received the mesage; he's awared to be here tomorrow to see Dr. [name] and Paulla Dolly. Follow-up by: Morrison Old RN,  January 04, 2010 4:18 PM

## 2010-03-21 NOTE — Progress Notes (Signed)
Summary: phone/gg  Phone Note Call from Patient   Caller: Daughter Summary of Call: Daughter states pt has not taken any Amiodarone since hospital d/c.  He did not have a Rx and no record of pt being on this med at pharmacy. I talked with Dr Mamie Nick and pt was put on this med short term and was to be followed up with cardoilogy. Dr [name] will see pt tomorrow and was informed of above. Initial call taken by: Gevena Cotton RN,  January 02, 2010 5:04 PM

## 2010-03-21 NOTE — Assessment & Plan Note (Signed)
Summary: NEW HFU-PER DR REYNOLDS/CFB TO ASSIGN TO HER   Vital Signs:  Patient profile:   55 year old male Height:      70 inches (177.80 cm) Weight:      192.3 pounds (87.41 kg) BMI:     27.69 Temp:     97.6 degrees F (36.44 degrees C) oral Pulse rate:   64 / minute BP sitting:   131 / 84  (right arm)  Vitals Entered By: Hilda Blades Ditzler RN (January 03, 2010 9:34 AM) Is Patient Diabetic? Yes Did you bring your meter with you today? Yes Pain Assessment Patient in pain? no      Nutritional Status BMI of 25 - 29 = overweight Nutritional Status Detail appetite good CBG Result 282  Have you ever been in a relationship where you felt threatened, hurt or afraid?denies   Does patient need assistance? Functional Status Self care Ambulation Normal Comments New pt to [facility] HFU - needs referral Barnabas Harries.   Diabetic Foot Exam Foot Inspection  Diabetic Foot Care Education Patient educated on appropriate care of diabetic feet.  Pulse Check          Right Foot          Left Foot Posterior Tibial:        normal            normal Dorsalis Pedis:        normal            normal  High Risk Feet? 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  His blood sugar readings were running high for the past 2 days on glucometer, though he has not recorded the readings for all these days since the time he was d/c from hospital. He also reports that yesterday( a day prior to his visit) he had an episode when he became diaphoretic, pale  as if he was  running out of energy. This episode lasted for 30 mins. He denies any asssociated C/P / palpitations/ SOB/ Fever/N/V/D.  His blood sugar reading at that time was 269.  Apparently the patient was suppose to take amiodarone but he was not, but the prescription was called in for that by Dr. Percival Spanish and he will be starting it today.  He follows Dr. Elie Confer in the coumadin clinic. Last INR was 3.2.  Today he says he is fine. He denies any new complaints. He denies any weakness/numbness/speech difficulties.    Depression History:      The patient denies a depressed mood most of the day and a diminished interest in his usual daily activities.         Preventive Screening-Counseling & Management  Alcohol-Tobacco     Smoking Status: never  Current Medications (verified): 1)  Aspirin 81 Mg Tbec (Aspirin) .... Take 1 Tablet By Mouth Once A Day 2)  Amiodarone Hcl 200 Mg Tabs (  Amiodarone Hcl) .Marland Kitchen.. 1 Tab By Mouth Twice Daily For 2 Weeks, Then Continue On 1 Tab By Mouth Once Daily 3)  Carvedilol 25 Mg Tabs (Carvedilol) .... One Twice Day 4)  Furosemide 40 Mg Tabs (Furosemide) .... Take 1 Tablet By Mouth Once A Day 5)  Lisinopril 40 Mg Tabs (Lisinopril) .... Take 1 Tablet By Mouth Two Times A Day 6)  Pravastatin Sodium 40 Mg Tabs (Pravastatin Sodium) .... Take 1 Tablet By Mouth Once A Day 7)  Spironolactone 25 Mg Tabs (Spironolactone) .... Take 1 Tablet By Mouth Once A Day 8)  Amlodipine Besylate 5 Mg Tabs (Amlodipine Besylate) .... Take 1 Tablet By Mouth Once A Day 9)  Warfarin Sodium 5 Mg Tabs (Warfarin Sodium) .... Take 2 Tablets By Mouth Once Daily On 11/1 and 11/2. Then Take As Directed By Dr. Elie Confer, After You Are Evaluated At The Coumadin Clinic. 10)  Metformin Hcl 500 Mg Tabs (Metformin Hcl) .... Take 2 Tablets By Mouth in The Morning and 1 in The Evening For First Week, Then Take 2 Tablets By Mouth Two Times A Day  Allergies: 1)  ! Codeine  Social History: Smoking Status:  never  Review  of Systems      See HPI  The patient denies anorexia, fever, chest pain, syncope, dyspnea on exertion, peripheral edema, and prolonged cough.    Physical Exam  General:  alert, well-developed, and well-nourished.   Head:  normocephalic and atraumatic.   Eyes:  vision grossly intact, pupils equal, and pupils round.   Mouth:  pharynx pink and moist.   Neck:  supple and full ROM.   Lungs:  normal respiratory effort, no intercostal retractions, no accessory muscle use, normal breath sounds, no dullness, no fremitus, no crackles, and no wheezes.   Heart:  normal rate, regular rhythm, no murmur, no gallop, no rub, and no JVD.   Abdomen:  soft, non-tender, normal bowel sounds, no distention, no masses, no guarding, and no rigidity.   Msk:  normal ROM, no joint tenderness, no joint swelling, and no joint warmth.   Pulses:  2 +pulses b/l. Extremities:  no cyanosis, no clubbing, no pedal edema Neurologic:  alert & oriented X3, cranial nerves II-XII intact, motor:strength normal in all extremities, weak grip on the left side as compared to right,and sensation intact to light touch b/l.   Diabetes Management Exam:    Foot Exam (with socks and/or shoes not present):       Sensory-Monofilament:          Left foot: normal          Right foot: normal   Impression & Recommendations:  Problem # 1:  CEREBROVASCULAR ACCIDENT (ICD-434.91) He denies any weakness,numbness, speech difficulties. On exam he just had weak grip on the left side. Will continue him on same regimen. His updated medication list for this problem includes:    Aspirin 81 Mg Tbec (Aspirin) .Marland Kitchen... Take 1 tablet by mouth once a day    Warfarin Sodium 5 Mg Tabs (Warfarin sodium) .Marland Kitchen... Take 2 tablets by mouth once daily on 11/1 and 11/2. then take as directed by dr. Elie Confer, after you are evaluated at the coumadin clinic.  Problem # 2:  ATRIAL FIBRILLATION WITH RAPID VENTRICULAR RESPONSE (ICD-427.31) He was seen by Dr. Percival Spanish on  12/29/09, but apparently he was not taking his amiodarone. He will start taking it from today. Since warfarin and amiodarone interact with each other, he was scheduled an appointment with Dr. Elie Confer on  Thursday for repeat INR check and adjustment in the dose for his coumadin. His updated medication list for this problem includes:    Aspirin 81 Mg Tbec (Aspirin) .Marland Kitchen... Take 1 tablet by mouth once a day    Amiodarone Hcl 200 Mg Tabs (Amiodarone hcl) .Marland Kitchen... 1 tab by mouth twice daily for 2 weeks, then continue on 1 tab by mouth once daily    Carvedilol 25 Mg Tabs (Carvedilol) ..... One twice day    Amlodipine Besylate 5 Mg Tabs (Amlodipine besylate) .Marland Kitchen... Take 1 tablet by mouth once a day    Warfarin Sodium 5 Mg Tabs (Warfarin sodium) .Marland Kitchen... Take 2 tablets by mouth once daily on 11/1 and 11/2. then take as directed by dr. Elie Confer, after you are evaluated at the coumadin clinic.  Problem # 3:  DIABETES MELLITUS, TYPE II, UNCONTROLLED (ICD-250.02) His blood sugar readings were running high on glucometer. They were ranging from 250-300. His A1C today was 11.1. We decided to go up on his metformin. His last creatinine was 1.29. Will add glipizide after 2 weeks if his blood sugars are not adequately controlled with metformin. Will try orall regimen first but if we are not successful , will start him on NPH. Will check his CMP today. His updated medication list for this problem includes:    Aspirin 81 Mg Tbec (Aspirin) .Marland Kitchen... Take 1 tablet by mouth once a day    Lisinopril 40 Mg Tabs (Lisinopril) .Marland Kitchen... Take 1 tablet by mouth two times a day    Metformin Hcl 500 Mg Tabs (Metformin hcl) .Marland Kitchen... Take 2 tablets by mouth in the morning and 1 in the evening for first week, then take 2 tablets by mouth two times a day  Orders: T- Capillary Blood Glucose [id number]) T-Hgb A1C (in-house) [id number]) T-Comprehensive Metabolic Panel ([id number])  Problem # 4:  ESSENTIAL HYPERTENSION, BENIGN (ICD-401.1) His BP was systolics in  [id number], well controlled. Continue the same regimen. His updated medication list for this problem includes:    Carvedilol 25 Mg Tabs (Carvedilol) ..... One twice day    Furosemide 40 Mg Tabs (Furosemide) .Marland Kitchen... Take 1 tablet by mouth once a day    Lisinopril 40 Mg Tabs (Lisinopril) .Marland Kitchen... Take 1 tablet by mouth two times a day    Spironolactone 25 Mg Tabs (Spironolactone) .Marland Kitchen... Take 1 tablet by mouth once a day    Amlodipine Besylate 5 Mg Tabs (Amlodipine besylate) .Marland Kitchen... Take 1 tablet by mouth once a day  Complete Medication List: 1)  Aspirin 81 Mg Tbec (Aspirin) .... Take 1 tablet by mouth once a day 2)  Amiodarone Hcl 200 Mg Tabs (Amiodarone hcl) .Marland Kitchen.. 1 tab by mouth twice daily for 2 weeks, then continue on 1 tab by mouth once daily 3)  Carvedilol 25 Mg Tabs (Carvedilol) .... One twice day 4)  Furosemide 40 Mg Tabs (Furosemide) .... Take 1 tablet by mouth once a day 5)  Lisinopril 40 Mg Tabs (Lisinopril) .... Take 1 tablet by mouth two times a day 6)  Pravastatin Sodium 40 Mg Tabs (Pravastatin sodium) .... Take 1 tablet by mouth once a day 7)  Spironolactone 25 Mg Tabs (Spironolactone) .... Take 1 tablet by mouth once a day 8)  Amlodipine Besylate 5 Mg Tabs (Amlodipine besylate) .... Take 1 tablet by mouth once a day 9)  Warfarin Sodium 5 Mg Tabs (Warfarin sodium) .... Take 2 tablets by mouth once daily on 11/1 and 11/2. then take as directed by dr. Elie Confer, after you are evaluated  at the coumadin clinic. 10)  Metformin Hcl 500 Mg Tabs (Metformin hcl) .... Take 2 tablets by mouth in the morning and 1 in the evening for first week, then take 2 tablets by mouth two times a day  Patient Instructions: 1)  Please schedule a follow-up appointment in 2 weeks. 2)  Please take your medicines as prescribed. 3)  Please f/u with Dr. Elie Confer for your INR - check up on thursday at 2 pm.   Orders Added: 1)  T- Capillary Blood Glucose [[id number]] 2)  T-Hgb A1C (in-house) [[id number]] 3)  T-Comprehensive Metabolic  Panel [id number] 4)  Est. Patient Level IV [id number]   Immunization History:  Influenza Immunization History:    Influenza:  historical (12/11/2009)  Pneumovax Immunization History:    Pneumovax:  historical (12/11/2009)   Immunization History:  Influenza Immunization History:    Influenza:  Historical (12/11/2009)  Pneumovax Immunization History:    Pneumovax:  Historical (12/11/2009) Process Orders Check Orders Results:     [facility]: Check successful Tests Sent for requisitioning (January 04, 2010 11:42 AM):     01/03/2010: [facility] -- T-Comprehensive Metabolic Panel [id number] (signed)     Prevention & Chronic Care Immunizations   Influenza vaccine: Historical  (12/11/2009)    Tetanus booster: Not documented    Pneumococcal vaccine: Historical  (12/11/2009)  Colorectal Screening   Hemoccult: Not documented    Colonoscopy: Not documented  Other Screening   PSA: Not documented   Smoking status: never  (01/03/2010)  Diabetes Mellitus   HgbA1C: 11.1  (01/03/2010)    Eye exam: Not documented    Foot exam: yes  (01/03/2010)   Foot exam action/deferral: Do today   High risk foot: Yes  (01/03/2010)   Foot care education: Done  (01/03/2010)    Urine microalbumin/creatinine ratio: Not documented    Diabetes flowsheet reviewed?: Yes   Progress toward A1C goal: Deteriorated  Lipids   Total Cholesterol: Not documented   LDL: Not documented   LDL Direct: Not documented   HDL: Not documented   Triglycerides: Not documented    SGOT (AST): Not documented   SGPT (ALT): Not documented CMP ordered    Alkaline phosphatase: Not documented   Total bilirubin: Not documented  Hypertension   Last Blood Pressure: 131 / 84  (01/03/2010)   Serum creatinine: Not documented   Serum potassium Not documented CMP ordered   Self-Management Support :   Personal Goals (by the next clinic visit) :     Personal A1C goal: 7   (01/03/2010)     Personal blood pressure goal: 130/80  (01/03/2010)     Personal LDL goal: 100  (01/03/2010)    Patient will work on the following items until the next clinic visit to reach self-care goals:     Medications and monitoring: take my medicines every day, check my blood sugar, check my blood pressure, bring all of my medications to every visit, examine my feet every day  (01/03/2010)     Eating: drink diet soda or water instead of juice or soda, eat more vegetables, use fresh or frozen vegetables, eat foods that are low in salt, eat baked foods instead of fried foods, eat fruit for snacks and desserts, limit or avoid alcohol  (01/03/2010)     Activity: take a 30 minute walk every day  (01/03/2010)    Diabetes self-management support: Copy of home glucose meter record, Written self-care plan, Education handout, Pre-printed educational material, Resources for patients handout  (  01/03/2010)   Diabetes care plan printed   Diabetes education handout printed    Hypertension self-management support: Written self-care plan, Education handout, Resources for patients handout  (01/03/2010)   Hypertension self-care plan printed.   Hypertension education handout printed    Lipid self-management support: Written self-care plan, Education handout, Resources for patients handout  (01/03/2010)   Lipid self-care plan printed.   Lipid education handout printed      Resource handout printed.   Nursing Instructions: Diabetic foot exam today           Diabetic Foot Exam Foot Inspection Is there a history of a foot ulcer?              No Is there a foot ulcer now?              No Can the patient see the bottom of their feet?          Yes Are the shoes appropriate in style and fit?          Yes Is there swelling or an abnormal foot shape?          No Are the toenails long?                No Are the toenails thick?                Yes Are the toenails ingrown?              No Is there  heavy callous build-up?              No Is there a claw toe deformity?                          No Is there elevated skin temperature?            No Is there limited ankle dorsiflexion?            No Is there foot or ankle muscle weakness?            No Do you have pain in calf while walking?           No      Diabetic Foot Care Education :Patient educated on appropriate care of diabetic feet.  Pulse Check          Right Foot          Left Foot Posterior Tibial:        normal            normal Dorsalis Pedis:        normal            normal  High Risk Feet? Yes   10-g (5.07) Semmes-Weinstein Monofilament Test Performed by: Hilda Blades Ditzler RN          Right Foot          Left Foot Visual Inspection     normal          Test Control      normal         normal Site 1         normal         normal Site 2         normal         normal Site 3         normal         normal Site  4         normal         normal Site 5         normal         normal Site 6         normal         normal Site 7         normal         normal Site 8         normal         normal Site 9         normal         normal Site 10         normal         normal  Impression      normal         normal   Laboratory Results   Blood Tests   Date/Time Received: January 03, 2010 9:46 AM  Date/Time Reported: Lenoria Farrier  January 03, 2010 9:46 AM   HGBA1C: 11.1%   (Normal Range: Non-Diabetic - 3-6%   Control Diabetic - 6-8%) CBG Random:: 282mg /dL

## 2010-03-21 NOTE — Assessment & Plan Note (Signed)
Summary: np6/afb rvr chs stroke/medicare   Visit Type:  Follow-up Primary Provider:  [name] DO  CC:  CHF.  History of Present Illness: The patient presents for followup after being hospitalized with heart failure last month. He was found to have a cardiomyopathy with an ejection fraction of about 25% and global hypokinesis. He was in atrial fibrillation on presentation with a rapid ventricular rate. He also was quite hypertensive.  After hospitalization he suffered an embolic CVA treated with TPA. This was a right hemispheric infarct.  past had a nice recovery with some residual right upper arm weakness and fine motor dysfunction. Since discharge she has had no acute complaints. He has had none of the dyspnea that prompted his evaluation. He has had no PND or orthopnea. He has not felt any palpitations, presyncope or syncope. He denies any chest pressure, neck or arm discomfort. He has been taking Coumadin and otherwise compliant with his medications.  Current Medications (verified): 1)  Aspirin 81 Mg Tbec (Aspirin) .... Take 1 Tablet By Mouth Once A Day 2)  Amiodarone Hcl 200 Mg Tabs (Amiodarone Hcl) .Marland Kitchen.. 1 Tab By Mouth Twice Daily For 2 Weeks, Then Continue On 1 Tab By Mouth Once Daily 3)  Carvedilol 12.5 Mg Tabs (Carvedilol) .... Take 1 Tablet By Mouth Two Times A Day 4)  Furosemide 40 Mg Tabs (Furosemide) .... Take 1 Tablet By Mouth Once A Day 5)  Lisinopril 40 Mg Tabs (Lisinopril) .... Take 1 Tablet By Mouth Two Times A Day 6)  Pravastatin Sodium 40 Mg Tabs (Pravastatin Sodium) .... Take 1 Tablet By Mouth Once A Day 7)  Spironolactone 25 Mg Tabs (Spironolactone) .... Take 1 Tablet By Mouth Once A Day 8)  Amlodipine Besylate 5 Mg Tabs (Amlodipine Besylate) .... Take 1 Tablet By Mouth Once A Day 9)  Warfarin Sodium 5 Mg Tabs (Warfarin Sodium) .... Take 2 Tablets By Mouth Once Daily On 11/1 and 11/2. Then Take As Directed By Dr. Elie Confer, After You Are Evaluated At The Coumadin  Clinic. 10)  Metformin Hcl 500 Mg Tabs (Metformin Hcl) .... Take 1 Tablet By Mouth Two Times A Day  Allergies (verified): 1)  ! Codeine  Past History:  Past Medical History: Diabetes Hypertension Atrial fibrillation Cardiomyopathy Cerebrovascular accident   Past Surgical History: Ear surgery as a child.   Review of Systems       As stated in the HPI and negative for all other systems.   Vital Signs:  Patient profile:   55 year old male Height:      70 inches Weight:      188 pounds BMI:     27.07 Pulse rate:   73 / minute Resp:     16 per minute BP sitting:   158 / 98  (right arm)  Vitals Entered By: Levora Angel, CNA (December 29, 2009 2:33 PM)  Physical Exam  General:  Well developed, well nourished, in no acute distress. Head:  normocephalic and atraumatic Eyes:  PERRLA/EOM intact; conjunctiva and lids normal. Mouth:  Poor dentition. Oral mucosa normal. Neck:  Neck supple, no JVD. No masses, thyromegaly or abnormal cervical nodes. Chest Wall:  no deformities or breast masses noted Lungs:  Clear bilaterally to auscultation and percussion. Abdomen:  Bowel sounds positive; abdomen soft and non-tender without masses, organomegaly, or hernias noted. No hepatosplenomegaly. Msk:  Back normal, normal gait. Muscle strength and tone normal. Extremities:  No clubbing or cyanosis. Neurologic:  Mild left upper arm weakness otherwise unremarkable  Skin:  Intact without lesions or rashes. Cervical Nodes:  no significant adenopathy Inguinal Nodes:  no significant adenopathy Psych:  Normal affect.   Detailed Cardiovascular Exam  Neck    Carotids: Carotids full and equal bilaterally without bruits.      Neck Veins: Normal, no JVD.    Heart    Inspection: no deformities or lifts noted.      Palpation: normal PMI with no thrills palpable.      Auscultation: regular rate and rhythm, S1, S2 without murmurs, rubs, gallops, or clicks.    Vascular    Abdominal Aorta: no  palpable masses, pulsations, or audible bruits.      Femoral Pulses: normal femoral pulses bilaterally.      Pedal Pulses: normal pedal pulses bilaterally.      Radial Pulses: normal radial pulses bilaterally.      Peripheral Circulation: no clubbing, cyanosis, or edema noted with normal capillary refill.     EKG  Procedure date:  12/29/2009  Findings:      Sinus rhythm, rate 73, left axis deviation, LVH, long QT, poor anterior R-wave progression  Impression & Recommendations:  Problem # 1:  CONGESTIVE HEART FAILURE, SYSTOLIC DYSFUNCTION (A999333) Today I will increase his carvedilol to 25 mg b.i.d. We will continue other meds as listed.  I will plan to evaluate this further to identify the etiology most likely with an exercise perfusion study after he has recovered further from his stroke.  Problem # 2:  ESSENTIAL HYPERTENSION, BENIGN (ICD-401.1) This will be managed in the context of treating his cardiomyopathy.  Problem # 3:  ATRIAL FIBRILLATION WITH RAPID VENTRICULAR RESPONSE (ICD-427.31) He remains on Coumadin followed at [facility]  Patient Instructions: 1)  Your physician recommends that you schedule a follow-up appointment in: 1 month with Dr Percival Spanish 2)  Your physician has recommended you make the following change in your medication: Increase Carvedilol to 25 mg one twice a day 3)  You have been diagnosed with Congestive Heart Failure or CHF.  CHF is a condition in which a problem with the structure or function of the heart impairs its ability to supply sufficient blood flow to meet the body's needs.  For further information please visit www.cardiosmart.org for detailed information on CHF. 4)  Your physician recommends that you weigh, daily, at the same time every day, and in the same amount of clothing.  Please record your daily weights on the handout provided and bring it to your next appointment. Prescriptions: CARVEDILOL 25 MG TABS (CARVEDILOL) ONE TWICE DAY  #60 x 6    Entered by:   Sim Boast, RN   Authorized by:   Minus Breeding, MD, [facility]   Signed by:   Sim Boast, RN on 12/29/2009   Method used:   Electronically to        [facility] [phone]* (retail)       [address]       [place], [place]  [address]       Ph: [phone]       Fax: [phone]   RxIDID:[id number]  I have reviewed and approved all prescriptions at the time of this visit. Minus Breeding, MD, [facility]  December 29, 2009 3:24 PM

## 2010-03-21 NOTE — Progress Notes (Signed)
Summary: question regarding meds  Phone Note Call from Patient Call back at Home Phone [phone]   Caller: Daughter-brittany freeman [phone] Reason for Call: Talk to Nurse Summary of Call: per pt dtr, has question regarding all medication. Initial call taken by: Neil Crouch,  January 02, 2010 1:10 PM  Follow-up for Phone Call        pt confirm can speak w/daughter concerning meds and care.  Reviewed meds and pt does not have Amiodarone.  While on the phone the pt became diaphoretic and CBG was 267-pt daughter to call PCP for direction.  Dr. Dyke Brackett pt be on Amiodarone? On discharge list and see a note about it but for some reason the pt does not have it. Please advise. f/u on 12/16 with you. Follow-up by: Joan Flores RN,  January 02, 2010 1:33 PM  Additional Follow-up for Phone Call Additional follow up Details #1::        pt daughter has more question on meds Additional Follow-up by: Regan Lemming,  January 02, 2010 1:48 PM    Additional Follow-up for Phone Call Additional follow up Details #2::    answer pt additional questions on meds. will await MD info.  Follow-up by: Joan Flores RN,  January 02, 2010 2:02 PM  Additional Follow-up for Phone Call Additional follow up Details #3:: Details for Additional Follow-up Action Taken: Yes the patient should be on the amiodarone as described in the recent note.   Appended Document: question regarding meds    Prescriptions: AMIODARONE HCL 200 MG TABS (AMIODARONE HCL) 1 tab by mouth twice daily for 2 weeks, then continue on 1 tab by mouth once daily  #42 x 6   Entered by:   Joan Flores RN   Authorized by:   Minus Breeding, MD, [facility]   Signed by:   Joan Flores RN on 01/03/2010   Method used:   Electronically to        [facility] [phone]* (retail)       [address]       [place], [place]  [address]       Ph: [phone]       Fax: [phone]   RxIDMK:[id number]     Appended Document: question regarding meds adv pt daughter that prescription sent for Amiodarone. Joan Flores, RN, BSN

## 2010-03-21 NOTE — Progress Notes (Signed)
Summary: Questions  Phone Note Call from Patient   Caller: Patient Call For: [facility] Summary of Call: Call from pt asking to speak with Dr. [name] about his medications.  Would not give any further details. Sander Nephew RN  January 20, 2010 11:25 AM  Initial call taken by: Sander Nephew RN,  January 20, 2010 11:25 AM  Follow-up for Phone Call        Patient was called and explained about his medications. Follow-up by: Pedro Earls,  January 20, 2010 4:09 PM

## 2010-03-21 NOTE — Assessment & Plan Note (Signed)
Summary: F/U VISIT PER [facility] SEEING Brian [name] ALSO/[facility]   Vital Signs:  Patient profile:   55 year old male Height:      70 inches Weight:      192.2 pounds BMI:     27.68 Temp:     97.8 degrees F oral Pulse rate:   70 / minute BP sitting:   120 / 70  (right arm)  Vitals Entered By: Silverio Decamp NT II (January 09, 2010 9:39 AM) CC: followup visit Is Patient Diabetic? Yes Did you bring your meter with you today? No Pain Assessment Patient in pain? no      Nutritional Status BMI of 25 - 29 = overweight  Have you ever been in a relationship where you felt threatened, hurt or afraid?No   Does patient need assistance? Functional Status Self care Ambulation Normal   Primary Care Provider:  [name] DO  CC:  followup visit.  History of Present Illness: 55 y/o man presents for a f/u for his abnormal labs during the last clinic visit.  He denies any new complaints. Denies C/P, SOB, palpitations, fever, N/V/D.   Preventive Screening-Counseling & Management  Alcohol-Tobacco     Smoking Status: never  Current Medications (verified): 1)  Aspirin 81 Mg Tbec (Aspirin) .... Take 1 Tablet By Mouth Once A Day 2)  Amiodarone Hcl 200 Mg Tabs (Amiodarone Hcl) .Marland Kitchen.. 1 Tab By Mouth Twice Daily For 2 Weeks, Then Continue On 1 Tab By Mouth Once Daily 3)  Carvedilol 25 Mg Tabs (Carvedilol) .... One Twice Day 4)  Furosemide 40 Mg Tabs (Furosemide) .... Take 1 Tablet By Mouth Once A Day 5)  Pravastatin Sodium 40 Mg Tabs (Pravastatin Sodium) .... Take 1 Tablet By Mouth Once A Day 6)  Amlodipine Besylate 10 Mg Tabs (Amlodipine Besylate) .... Take 1 Tab By Mouth Once Daily. 7)  Warfarin Sodium 5 Mg Tabs (Warfarin Sodium) .... Take 2 Tablets By Mouth Once Daily On 11/1 and 11/2. Then Take As Directed By Dr. Elie Confer, After You Are Evaluated At [facility]. 8)  Humulin N 100 Unit/ml Susp (Insulin Isophane Human) .... Inject 10 Units [place] 30 Min Before Your Evening Meal. 9)  Glipizide  10 Mg Tabs (Glipizide) .... Take 1 Tab By Mouth Once Daily in The Morning. 10)  Insulin Syringe 31g X 5/16" 0.3 Ml Misc (Insulin Syringe-Needle U-100) .... Use Once Daily To Inject Insulin  Allergies (verified): 1)  ! Codeine  Review of Systems      See HPI  The patient denies anorexia, fever, hoarseness, chest pain, syncope, dyspnea on exertion, peripheral edema, and prolonged cough.    Physical Exam  General:  alert, well-developed, well-nourished, and well-hydrated.   Head:  normocephalic and atraumatic.  normocephalic and atraumatic.   Eyes:  vision grossly intact, pupils equal, pupils round, and pupils reactive to light.  vision grossly intact, pupils equal, pupils round, and pupils reactive to light.   Mouth:  pharynx pink and moist and fair dentition.  pharynx pink and moist.   Neck:  supple and full ROM.  supple and full ROM.   Lungs:  normal respiratory effort, no intercostal retractions, no accessory muscle use, normal breath sounds, no dullness, no fremitus, no crackles, and no wheezes.  normal respiratory effort, no intercostal retractions, no accessory muscle use, normal breath sounds, no dullness, no fremitus, no crackles, and no wheezes.   Heart:  normal rate, regular rhythm, no murmur, no gallop, no rub, and no JVD.  normal  rate, regular rhythm, no murmur, no gallop, no rub, and no JVD.   Abdomen:  soft, non-tender, normal bowel sounds, no distention, no masses, no guarding, and no rigidity.  soft, non-tender, normal bowel sounds, no distention, no masses, no guarding, and no rigidity.   Msk:  normal ROM, no joint tenderness, no joint swelling, no joint warmth, and no redness over joints.  normal ROM, no joint tenderness, no joint swelling, no joint warmth, and no redness over joints.   Pulses:  2+pulses b/l. Extremities:  no cyanosi, clubbing or edema. Neurologic:  alert & oriented X3. , weak grip on left side as compared to the right, sensation intact to pin prick and light  touch b/l , gait normal, and DTRs symmetrical and normal.     Impression & Recommendations:  Problem # 1:  RENAL FAILURE, ACUTE (ICD-584.9) Assessment Improved His acute renal failure was thought to be related his meds i.e lisinopril and lasix. He was adviced not to take them in his previous visit but he had some confusion and continued his lasix. Will rechek his stat BMET today and will make appropriate changes based on the result.   Based on the results, Cr -1.74 , which has improved from his previous visit Cr-2.07(11/17). Was reinforced not to take his lisinopril (that was stopped in his last visit). After discussing with Dr. [name], we decided to continue his lasix in the setting of CHF with EF of 25-30%. [name] Metabolic Panel (99991111)  Problem # 2:  HYPERKALEMIA (ICD-276.7) Assessment: Deteriorated His K today is 5.4. His lisinopril and spironolactone was stopped with the previous visit. Was reinforced not to take lisinopril and spironolactone. Will recheck his BMET with his next visit.  Problem # 3:  DIABETES MELLITUS, TYPE II, UNCONTROLLED (ICD-250.02) Assessment: Deteriorated His random blood sugar with the BMET panel was 426. He has not been checking his blood sugars at home. Wil lincrease his glipizide from 5 to 10 today,with no change in his insulin dose. Was adviced to check his fasting blood sugar everday and get his glucometer with the next visit. Will make appropriate changes in his regimen, in the next visit based on the readings. He also spoke to our diabetes educator and councellor today. The following medications were removed from the medication list:    Lisinopril 40 Mg Tabs (Lisinopril) .Marland Kitchen... Take 1 tablet by mouth two times a day His updated medication list for this problem includes:    Aspirin 81 Mg Tbec (Aspirin) .Marland Kitchen... Take 1 tablet by mouth once a day    Humulin N 100 Unit/ml Susp (Insulin isophane human) ..... Inject 10 units [place] 30 min before your evening  meal.    Glipizide 10 Mg Tabs (Glipizide) .Marland Kitchen... Take 1 tab by mouth once daily in the morning.  Orders: Ophthalmology Referral (Ophthalmology)  Problem # 4:  Preventive Health Care (ICD-V70.0) Assessment: Comment Only The patient was referred to the study, and was found to have family h/o positive for polyps in the first degree relatives.We have discussed with the patient that he will need screening colonoscopy at some point given his age and positive family h/o for polyps.. 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 fib with RVR,  we would defer it for sometime. We are not comfortable stopping his warfarin at this time.The patient is also taking aspirin (which was started in the setting of low EF of 25-30%) and we need to stop his  aspirin a week before hemoccult testing.  With today's visit we  are not stopping either aspirin or warfarin.Therefore , will not give  hemoccult cards this week but may consider doing it with his next clinic visit.  Problem # 5:  ESSENTIAL HYPERTENSION, BENIGN (ICD-401.1) Assessment: Improved His BP today is 120/70. Well controlled with the current regimen. The following medications were removed from the medication list:    Lisinopril 40 Mg Tabs (Lisinopril) .Marland Kitchen... Take 1 tablet by mouth two times a day His updated medication list for this problem includes:    Carvedilol 25 Mg Tabs (Carvedilol) ..... One twice day    Furosemide 40 Mg Tabs (Furosemide) .Marland Kitchen... Take 1 tablet by mouth once a day    Amlodipine Besylate 10 Mg Tabs (Amlodipine besylate) .Marland Kitchen... Take 1 tab by mouth once daily.  Problem # 6:  CONGESTIVE HEART FAILURE, SYSTOLIC DYSFUNCTION (A999333) Assessment: Unchanged No signs of acute CHF excacerberation, but was continued on lasix and BB  in the setting of low EF. The following medications were removed from the medication list:    Lisinopril 40 Mg Tabs (Lisinopril) .Marland Kitchen... Take 1 tablet by mouth two times a  day His updated medication list for this problem includes:    Aspirin 81 Mg Tbec (Aspirin) .Marland Kitchen... Take 1 tablet by mouth once a day    Carvedilol 25 Mg Tabs (Carvedilol) ..... One twice day    Furosemide 40 Mg Tabs (Furosemide) .Marland Kitchen... Take 1 tablet by mouth once a day    Warfarin Sodium 5 Mg Tabs (Warfarin sodium) .Marland Kitchen... Take 2 tablets by mouth once daily on 11/1 and 11/2. then take as directed by dr. Elie Confer, after you are evaluated at the coumadin clinic.  Problem # 7:  ATRIAL FIBRILLATION WITH RAPID VENTRICULAR RESPONSE (ICD-427.31) Assessment: Comment Only His INR today is 3.0 He follows up with Dr. Elie Confer for his coumadin dose adjustment. His updated medication list for this problem includes:    Aspirin 81 Mg Tbec (Aspirin) .Marland Kitchen... Take 1 tablet by mouth once a day    Amiodarone Hcl 200 Mg Tabs (Amiodarone hcl) .Marland Kitchen... 1 tab by mouth twice daily for 2 weeks, then continue on 1 tab by mouth once daily    Carvedilol 25 Mg Tabs (Carvedilol) ..... One twice day    Amlodipine Besylate 10 Mg Tabs (Amlodipine besylate) .Marland Kitchen... Take 1 tab by mouth once daily.    Warfarin Sodium 5 Mg Tabs (Warfarin sodium) .Marland Kitchen... Take 2 tablets by mouth once daily on 11/1 and 11/2. then take as directed by dr. Elie Confer, after you are evaluated at the coumadin clinic.  Complete Medication List: 1)  Aspirin 81 Mg Tbec (Aspirin) .... Take 1 tablet by mouth once a day 2)  Amiodarone Hcl 200 Mg Tabs (Amiodarone hcl) .Marland Kitchen.. 1 tab by mouth twice daily for 2 weeks, then continue on 1 tab by mouth once daily 3)  Carvedilol 25 Mg Tabs (Carvedilol) .... One twice day 4)  Furosemide 40 Mg Tabs (Furosemide) .... Take 1 tablet by mouth once a day 5)  Pravastatin Sodium 40 Mg Tabs (Pravastatin sodium) .... Take 1 tablet by mouth once a day 6)  Amlodipine Besylate 10 Mg Tabs (Amlodipine besylate) .... Take 1 tab by mouth once daily. 7)  Warfarin Sodium 5 Mg Tabs (Warfarin sodium) .... Take 2 tablets by mouth once daily on 11/1 and 11/2. then take  as directed by dr. Elie Confer, after you are evaluated at the coumadin clinic. 8)  Humulin N 100 Unit/ml Susp (  Insulin isophane human) .... Inject 10 units [place] 30 min before your evening meal. 9)  Glipizide 10 Mg Tabs (Glipizide) .... Take 1 tab by mouth once daily in the morning. 10)  Insulin Syringe 31g X 5/16" 0.3 Ml Misc (Insulin syringe-needle u-100) .... Use once daily to inject insulin  Patient Instructions: 1)  Please  schedule a follow -up appointment in 1 week. 2)  Please take your medicines as prescribed. 3)  Please continue to hold lisinopril, spironolactone. 4)  Please take your glipizide as 10 mg every day in the morning. Prescriptions: GLIPIZIDE 10 MG TABS (GLIPIZIDE) Take 1 tab by mouth once daily in the morning.  #8 x 0   Entered and Authorized by:   Pedro Earls   Signed by:   Pedro Earls on 01/09/2010   Method used:   Print then Give to Patient   RxID:   [id number]    Orders Added: 1)  T-Basic Metabolic Panel 0000000 2)  Est. Patient Level IV [id number] 3)  Ophthalmology Referral [Ophthalmology]    Process Orders Check Orders Results:     [facility]: Check successful Tests Sent for requisitioning (January 14, 2010 10:26 AM):     01/09/2010: [facility] -- T-Basic Metabolic Panel 0000000 (signed)     Prevention & Chronic Care Immunizations   Influenza vaccine: Historical  (12/11/2009)    Tetanus booster: Not documented    Pneumococcal vaccine: Historical  (12/11/2009)  Colorectal Screening   Hemoccult: Not documented    Colonoscopy: Not documented  Other Screening   PSA: Not documented   Smoking status: never  (01/09/2010)  Diabetes Mellitus   HgbA1C: 11.1  (01/03/2010)    Eye exam: Not documented    Foot exam: yes  (01/03/2010)   Foot exam action/deferral: Do today   High risk foot: Yes  (01/03/2010)   Foot care education: Done  (01/03/2010)    Urine microalbumin/creatinine ratio: Not  documented  Lipids   Total Cholesterol: Not documented   LDL: Not documented   LDL Direct: Not documented   HDL: Not documented   Triglycerides: Not documented    SGOT (AST): 22  (01/03/2010)   SGPT (ALT): 33  (01/03/2010)   Alkaline phosphatase: 70  (01/03/2010)   Total bilirubin: 0.5  (01/03/2010)  Hypertension   Last Blood Pressure: 120 / 70  (01/09/2010)   Serum creatinine: 2.07  (01/05/2010)   Serum potassium 4.5  (01/05/2010)  Self-Management Support :   Personal Goals (by the next clinic visit) :     Personal A1C goal: 7  (01/03/2010)     Personal blood pressure goal: 130/80  (01/03/2010)     Personal LDL goal: 100  (01/03/2010)    Diabetes self-management support: Copy of home glucose meter record, Written self-care plan, Education handout, Pre-printed educational material, Resources for patients handout  (01/03/2010)    Hypertension self-management support: Written self-care plan, Education handout, Resources for patients handout  (01/03/2010)    Lipid self-management support: Written self-care plan, Education handout, Resources for patients handout  (01/03/2010)

## 2010-03-21 NOTE — Assessment & Plan Note (Signed)
Summary: COU/CH  Anticoagulant Therapy Managed by: Dorene Grebe. Elie Confer III  PharmD CACP PCP: [place] Attending: Bertha Stakes MD Indication 1: Atrial fibrillation Indication 2: Encounter for therapeutic drug monitoring  V58.83 Start date: 12/16/2009  Patient Assessment Reviewed by: Paulla Dolly PharmD  January 16, 2010 Medication review: verified warfarin dosage & schedule,verified previous prescription medications, verified doses & any changes, verified new medications, reviewed OTC medications, reviewed OTC health products-vitamins supplements etc Complications: none Dietary changes: none   Health status changes: none   Lifestyle changes: none   Recent/future hospitalizations: none   Recent/future procedures: none   Recent/future dental: none Patient Assessment Part 2:  Have you MISSED ANY DOSES or CHANGED TABLETS?  No missed Warfarin doses or changed tablets.  Have you had any BRUISING or BLEEDING ( nose or gum bleeds,blood in urine or stool)?  No reported bruising or bleeding in nose, gums, urine, stool.  Have you STARTED or STOPPED any MEDICATIONS, including OTC meds,herbals or supplements?  No other medications or herbal supplements were started or stopped.  Have you CHANGED your DIET, especially green vegetables,or ALCOHOL intake?  No changes in diet or alcohol intake.  Have you had any ILLNESSES or HOSPITALIZATIONS?  No reported illnesses or hospitalizations  Have you had any signs of CLOTTING?(chest discomfort,dizziness,shortness of breath,arms tingling,slurred speech,swelling or redness in leg)    No chest discomfort, dizziness, shortness of breath, tingling in arm, slurred speech, swelling, or redness in leg.     Treatment  Target INR: 2.0-3.0 INR: 2.1  Date: 01/16/2010 Regimen In:  52.5mg /week INR reflects regimen in: 2.1  New  Tablet strength: : 5mg  Regimen Out:     Sunday: 2 Tablet     Monday: 1 & 1/2 Tablet     Tuesday: 2 Tablet  Wednesday: 1 & 1/2 Tablet     Thursday: 2 Tablet      Friday: 1 & 1/2 Tablet     Saturday: 2 Tablet Total Weekly: 62.5mg /week mg  Next INR Due: 02/06/2010 Adjusted by: Dorene Grebe. Elie Confer III PharmD CACP   Return to anticoagulation clinic:  02/06/2010 Time of next visit: 1545    Allergies: 1)  ! Codeine

## 2010-03-21 NOTE — Assessment & Plan Note (Signed)
Summary: COU/CH  Anticoagulant Therapy Managed by: Dorene Grebe. Elie Confer III  PharmD CACP PCP: [place] Attending: Bertha Stakes MD Indication 1: Atrial fibrillation Indication 2: Encounter for therapeutic drug monitoring  V58.83 Start date: 12/16/2009  Patient Assessment Reviewed by: Paulla Dolly PharmD  January 09, 2010 Medication review: verified warfarin dosage & schedule,verified previous prescription medications, verified doses & any changes, verified new medications, reviewed OTC medications, reviewed OTC health products-vitamins supplements etc Complications: none Dietary changes: none   Health status changes: none   Lifestyle changes: none   Recent/future hospitalizations: none   Recent/future procedures: none   Recent/future dental: none Patient Assessment Part 2:  Have you MISSED ANY DOSES or CHANGED TABLETS?  No missed Warfarin doses or changed tablets.  Have you had any BRUISING or BLEEDING ( nose or gum bleeds,blood in urine or stool)?  No reported bruising or bleeding in nose, gums, urine, stool.  Have you STARTED or STOPPED any MEDICATIONS, including OTC meds,herbals or supplements?  YES. Has commenced AMIODARONE HCI.  Have you CHANGED your DIET, especially green vegetables,or ALCOHOL intake?  No changes in diet or alcohol intake.  Have you had any ILLNESSES or HOSPITALIZATIONS?  No reported illnesses or hospitalizations  Have you had any signs of CLOTTING?(chest discomfort,dizziness,shortness of breath,arms tingling,slurred speech,swelling or redness in leg)    No chest discomfort, dizziness, shortness of breath, tingling in arm, slurred speech, swelling, or redness in leg.     Treatment  Target INR: 2.0-3.0 INR: 3.0  Date: 01/09/2010 Regimen In:  60.0mg /week INR reflects regimen in: 3.0  New  Tablet strength: : 5mg  Regimen Out:     Sunday: 1 & 1/2 Tablet     Monday: 1 & 1/2 Tablet     Tuesday: 1 & 1/2 Tablet     Wednesday: 1 & 1/2  Tablet     Thursday: 1 & 1/2 Tablet      Friday: 1 & 1/2 Tablet     Saturday: 1 & 1/2 Tablet Total Weekly: 52.5mg /week mg  Next INR Due: 01/30/2010 Adjusted by: Dorene Grebe. Elie Confer III PharmD CACP   Return to anticoagulation clinic:  01/30/2010 Time of next visit: 1000    Allergies: 1)  ! Codeine

## 2010-03-21 NOTE — Letter (Signed)
Summary: EXTENDED LOG BOOK   EXTENDED LOG BOOK   Imported By: Garlan Fillers 01/20/2010 15:52:49  _____________________________________________________________________  External Attachment:    Type:   Image     Comment:   External Document

## 2010-03-21 NOTE — Progress Notes (Signed)
Summary: referral for mnt/dsmt/dmr  ---- Converted from flag ---- ---- 12/22/2009 2:33 PM, [name] DO wrote: [name], ok, I spoke with [name] and he will plan to see you at around 3:30PM after he has his coumadin clinic appt. Thank you for taking the time to see him.  So, bad news is that I called [name] and they said he does not have medicare part D. Therefore, no prescription drug coverage. So, that is why his insulin is so expensive. I have not seen him out of the hospital yet, so I will not start a 70/30 at this time, but when he follows up with Dr. [name] on 11/15, I will ask her to address the appropriate cheaper insulin options for him after reviewing his glucometer readings to see how he has been doing.  I sent him in a script for Metformin 500mg  two times a day, and will slowly escalate.  So, I will get him an appt with [name] for evaluation of help with drug coverage. In the meantime I will continue Metformin, and recommend diet control (which I appreciate your input and education of the patient). If you could please already go and teach him about insulin with vial and syringe in preparation of what we may have to do in the future. As well, the diabetic diet, and general education. He is just starting managing his diabetes, so i don't want to overwhelm him at this time with too much information.  I will flag donna tessitore to get in touch with him regarding possible help with drug coverage and what options are available to him.  Thank you for all of your help in the care of this patient, and for seeing him on such short notice. We will keep working on it, one step at a time. He is an extremely nice person.  Thank you!!  Sincerely, [name], D.O. ------------------------------  Phone Note Other Incoming   Summary of Call: need referral for MNT/DSMT Initial call taken by: Barnabas Harries RD,CDE,  December 22, 2009 4:18 PM       Diabetes Self Management Training Referral Patient Name: Brian [name] Date Of Birth: May 18, 1955 MRN: [id number] Current Diagnosis:  CONGESTIVE HEART FAILURE, SYSTOLIC DYSFUNCTION ([id number]) CEREBROVASCULAR ACCIDENT (ICD-434.91) HYPERLIPIDEMIA (ICD-272.4) ESSENTIAL HYPERTENSION, BENIGN (ICD-401.1) ATRIAL FIBRILLATION WITH RAPID VENTRICULAR RESPONSE (ICD-427.31) DIABETES MELLITUS, TYPE II, UNCONTROLLED (ICD-250.02)     Management Training Needs:   Initial Diabetes Self management Training   Insulin Instruction  Initial Medical Nutrition Therapy(3 hours/1st year)  Barriers:  Impaired dextarity  Other  forgetful and recently had stroke so dexterity issues iwth left hand/side

## 2010-03-21 NOTE — Assessment & Plan Note (Signed)
Summary: EST-1 WEEK RECHECK/CH   Vital Signs:  Patient profile:   55 year old male Height:      70 inches Weight:      194.2 pounds BMI:     27.97 Temp:     98.9 degrees F oral Pulse rate:   68 / minute BP sitting:   151 / 91  (right arm)  Vitals Entered By: Silverio Decamp NT II (January 16, 2010 3:54 PM) CC: followup visit/ need refills Is Patient Diabetic? Yes Did you bring your meter with you today? Yes Pain Assessment Patient in pain? no      Nutritional Status BMI of 25 - 29 = overweight  Have you ever been in a relationship where you felt threatened, hurt or afraid?No   Does patient need assistance? Functional Status Self care Ambulation Normal   Primary Care Provider:  [name] DO  CC:  followup visit/ need refills.  History of Present Illness: 55 y/o man with PMH significant for Type2 DM, HTN, A. fib with RVR, CHF(EF -30 -35%), R CVA comes to the clinic for a f/u sec to abnormal labs(high Cr and K).  He denies any new complaints. Denies c/p, SOB, abdominal pain, N/V/D, tingling or numbness.He reports that his blood sugars are running high.  In the setting of hyperkalemia, his lisinopril and spironolactone were held.    Preventive Screening-Counseling & Management  Alcohol-Tobacco     Smoking Status: never  Current Medications (verified): 1)  Aspirin 81 Mg Tbec (Aspirin) .... Take 1 Tablet By Mouth Once A Day 2)  Amiodarone Hcl 200 Mg Tabs (Amiodarone Hcl) .Marland Kitchen.. 1 Tab By Mouth Twice Daily For 2 Weeks, Then Continue On 1 Tab By Mouth Once Daily 3)  Carvedilol 25 Mg Tabs (Carvedilol) .... One Twice Day 4)  Furosemide 40 Mg Tabs (Furosemide) .... Take 1 Tablet By Mouth Once A Day 5)  Pravastatin Sodium 40 Mg Tabs (Pravastatin Sodium) .... Take 1 Tablet By Mouth Once A Day 6)  Amlodipine Besylate 10 Mg Tabs (Amlodipine Besylate) .... Take 1 Tab By Mouth Once Daily. 7)  Warfarin Sodium 5 Mg Tabs (Warfarin Sodium) .... Take 2 Tablets By Mouth Once  Daily On 11/1 and 11/2. Then Take As Directed By Dr. Elie Confer, After You Are Evaluated At [facility]. 8)  Humulin N 100 Unit/ml Susp (Insulin Isophane Human) .... Inject 15 Units Under The Skin Before Your Dinner and 10 Units Before Breakfast 9)  Insulin Syringe 31g X 5/16" 0.3 Ml Misc (Insulin Syringe-Needle U-100) .... Use Once Daily To Inject Insulin  Allergies (verified): 1)  ! Codeine  Review of Systems      See HPI  The patient denies anorexia, fever, weight loss, weight gain, vision loss, decreased hearing, hoarseness, chest pain, syncope, dyspnea on exertion, peripheral edema, prolonged cough, headaches, abdominal pain, melena, and hematochezia.    Physical Exam  General:  alert, well-developed, well-nourished, and well-hydrated.   Head:  normocephalic, atraumatic, no abnormalities observed, and no abnormalities palpated.   Eyes:  vision grossly intact, pupils equal, pupils round, and pupils reactive to light.   Mouth:  pharynx pink and moist.   Neck:  supple and full ROM.   Lungs:  normal respiratory effort, no intercostal retractions, no accessory muscle use, normal breath sounds, no dullness, no fremitus, no crackles, and no wheezes.   Heart:  normal rate, regular rhythm, no murmur, no gallop, no rub, and no JVD.   Abdomen:  soft, non-tender, normal bowel sounds, no  distention, no masses, no guarding, no rigidity, and no rebound tenderness.   Msk:  normal ROM, no joint tenderness, no joint swelling, no joint warmth, and no redness over joints.   Pulses:  2+pulses b/l. Extremities:  no cyanosis, clubbing or edema. Neurologic:  alert & oriented, very mild facial droop on the left side, weak grip on the left side as compared to right but improving since revious visitsalert & oriented X3, sensation intact to light touch, gait normal, and DTRs symmetrical and normal.     Impression & Recommendations:  Problem # 1:  DIABETES MELLITUS, TYPE II, UNCONTROLLED  (ICD-250.02) Assessment Deteriorated His blood sugars are running high. His glucometer readings show FBS  in 300's and 400's. He has difficulty paying for lantus.  Will incease his humulin N from 10 units at night to---- 15 units at night and 10 units in the morning.  Will d/c his glipizide. Will schedule an appointment with Barnabas Harries with his next clinic visit. He was adviced to check his blood sugars  in the morning and in the evening. The following medications were removed from the medication list:    Glipizide 10 Mg Tabs (Glipizide) .Marland Kitchen... Take 1 tab by mouth once daily in the morning. His updated medication list for this problem includes:    Aspirin 81 Mg Tbec (Aspirin) .Marland Kitchen... Take 1 tablet by mouth once a day    Humulin N 100 Unit/ml Susp (Insulin isophane human) ..... Inject 15 units under the skin before your dinner and 10 units before breakfast  Problem # 2:  HYPERKALEMIA (ICD-276.7) Assessment: Comment Only Will continue to hold lisinopril. Wil recheck his BMET today. Orders: T-Basic Metabolic Panel (99991111)  Problem # 3:  ESSENTIAL HYPERTENSION, BENIGN (ICD-401.1) Assessment: Comment Only His BP is slightly high. Will readjust his regimen after reviewing the results of his BMET. His updated medication list for this problem includes:    Carvedilol 25 Mg Tabs (Carvedilol) ..... One twice day    Furosemide 40 Mg Tabs (Furosemide) .Marland Kitchen... Take 1 tablet by mouth once a day    Amlodipine Besylate 10 Mg Tabs (Amlodipine besylate) .Marland Kitchen... Take 1 tab by mouth once daily.  Problem # 4:  CONGESTIVE HEART FAILURE, SYSTOLIC DYSFUNCTION (A999333) Assessment: Comment Only Will continue to hold lisinopril until the lab results are reviewed. If his hyperkalemia is resolved, may consider restarting him on low dose of lisinopril. But if hyperkalemia persist we may consider adding hydralazine for after load reduction. His updated medication list for this problem includes:    Aspirin 81 Mg  Tbec (Aspirin) .Marland Kitchen... Take 1 tablet by mouth once a day    Carvedilol 25 Mg Tabs (Carvedilol) ..... One twice day    Furosemide 40 Mg Tabs (Furosemide) .Marland Kitchen... Take 1 tablet by mouth once a day    Warfarin Sodium 5 Mg Tabs (Warfarin sodium) .Marland Kitchen... Take 2 tablets by mouth once daily on 11/1 and 11/2. then take as directed by dr. Elie Confer, after you are evaluated at the coumadin clinic.  Problem # 5:  ATRIAL FIBRILLATION WITH RAPID VENTRICULAR RESPONSE (ICD-427.31) Assessment: Comment Only He follows up with Dr. Elie Confer for coumadin dose readjustment. He was seen by Dr. Elie Confer today and his INR was 2.1. His updated medication list for this problem includes:    Aspirin 81 Mg Tbec (Aspirin) .Marland Kitchen... Take 1 tablet by mouth once a day    Amiodarone Hcl 200 Mg Tabs (Amiodarone hcl) .Marland Kitchen... 1 tab by mouth twice daily for 2 weeks, then continue  on 1 tab by mouth once daily    Carvedilol 25 Mg Tabs (Carvedilol) ..... One twice day    Amlodipine Besylate 10 Mg Tabs (Amlodipine besylate) .Marland Kitchen... Take 1 tab by mouth once daily.    Warfarin Sodium 5 Mg Tabs (Warfarin sodium) .Marland Kitchen... Take 2 tablets by mouth once daily on 11/1 and 11/2. then take as directed by dr. Elie Confer, after you are evaluated at the coumadin clinic.  Complete Medication List: 1)  Aspirin 81 Mg Tbec (Aspirin) .... Take 1 tablet by mouth once a day 2)  Amiodarone Hcl 200 Mg Tabs (Amiodarone hcl) .Marland Kitchen.. 1 tab by mouth twice daily for 2 weeks, then continue on 1 tab by mouth once daily 3)  Carvedilol 25 Mg Tabs (Carvedilol) .... One twice day 4)  Furosemide 40 Mg Tabs (Furosemide) .... Take 1 tablet by mouth once a day 5)  Pravastatin Sodium 40 Mg Tabs (Pravastatin sodium) .... Take 1 tablet by mouth once a day 6)  Amlodipine Besylate 10 Mg Tabs (Amlodipine besylate) .... Take 1 tab by mouth once daily. 7)  Warfarin Sodium 5 Mg Tabs (Warfarin sodium) .... Take 2 tablets by mouth once daily on 11/1 and 11/2. then take as directed by dr. Elie Confer, after you are  evaluated at the coumadin clinic. 8)  Humulin N 100 Unit/ml Susp (Insulin isophane human) .... Inject 15 units under the skin before your dinner and 10 units before breakfast 9)  Insulin Syringe 31g X 5/16" 0.3 Ml Misc (Insulin syringe-needle u-100) .... Use once daily to inject insulin  Patient Instructions: 1)  Please follow up in 3 weeks when you have an appointment with Dr. Elie Confer. 2)  Please take your medicines as prescribed.   Orders Added: 1)  T-Basic Metabolic Panel 0000000 2)  Est. Patient Level IV [id number]    Prevention & Chronic Care Immunizations   Influenza vaccine: Historical  (12/11/2009)    Tetanus booster: Not documented    Pneumococcal vaccine: Historical  (12/11/2009)  Colorectal Screening   Hemoccult: Not documented    Colonoscopy: Not documented  Other Screening   PSA: Not documented   Smoking status: never  (01/16/2010)  Diabetes Mellitus   HgbA1C: 11.1  (01/03/2010)    Eye exam: Not documented    Foot exam: yes  (01/03/2010)   Foot exam action/deferral: Do today   High risk foot: Yes  (01/03/2010)   Foot care education: Done  (01/03/2010)    Urine microalbumin/creatinine ratio: Not documented  Lipids   Total Cholesterol: Not documented   LDL: Not documented   LDL Direct: Not documented   HDL: Not documented   Triglycerides: Not documented    SGOT (AST): 22  (01/03/2010)   SGPT (ALT): 33  (01/03/2010)   Alkaline phosphatase: 70  (01/03/2010)   Total bilirubin: 0.5  (01/03/2010)  Hypertension   Last Blood Pressure: 151 / 91  (01/16/2010)   Serum creatinine: 1.74  (01/09/2010)   Serum potassium 5.4  (01/09/2010)  Self-Management Support :   Personal Goals (by the next clinic visit) :     Personal A1C goal: 7  (01/03/2010)     Personal blood pressure goal: 130/80  (01/03/2010)     Personal LDL goal: 100  (01/03/2010)    Diabetes self-management support: Copy of home glucose meter record, Written self-care plan, Education  handout, Pre-printed educational material, Resources for patients handout  (01/03/2010)    Hypertension self-management support: Written self-care plan, Education handout, Resources for patients handout  (01/03/2010)  Lipid self-management support: Written self-care plan, Education handout, Resources for patients handout  (01/03/2010)   Process Orders Check Orders Results:     [facility]: Check successful Tests Sent for requisitioning (January 17, 2010 8:53 AM):     01/16/2010: [facility] -- T-Basic Metabolic Panel 0000000 (signed)

## 2010-03-21 NOTE — Letter (Signed)
Summary: MODAL WEEK REPORT /PLASMA  MODAL WEEK REPORT /PLASMA   Imported By: Garlan Fillers 01/16/2010 10:58:10  _____________________________________________________________________  External Attachment:    Type:   Image     Comment:   External Document

## 2010-03-22 ENCOUNTER — Encounter: Payer: Self-pay | Admitting: Cardiology

## 2010-03-23 ENCOUNTER — Ambulatory Visit (HOSPITAL_COMMUNITY): Payer: Medicare PPO | Attending: Cardiology

## 2010-03-23 ENCOUNTER — Ambulatory Visit: Admit: 2010-03-23 | Payer: Self-pay | Admitting: Physician Assistant

## 2010-03-23 ENCOUNTER — Ambulatory Visit (INDEPENDENT_AMBULATORY_CARE_PROVIDER_SITE_OTHER): Payer: Medicare PPO | Admitting: Cardiology

## 2010-03-23 ENCOUNTER — Ambulatory Visit: Admit: 2010-03-23 | Payer: Self-pay

## 2010-03-23 ENCOUNTER — Encounter: Payer: Self-pay | Admitting: Cardiology

## 2010-03-23 DIAGNOSIS — E785 Hyperlipidemia, unspecified: Secondary | ICD-10-CM | POA: Insufficient documentation

## 2010-03-23 DIAGNOSIS — E119 Type 2 diabetes mellitus without complications: Secondary | ICD-10-CM | POA: Insufficient documentation

## 2010-03-23 DIAGNOSIS — I079 Rheumatic tricuspid valve disease, unspecified: Secondary | ICD-10-CM | POA: Insufficient documentation

## 2010-03-23 DIAGNOSIS — I509 Heart failure, unspecified: Secondary | ICD-10-CM

## 2010-03-23 DIAGNOSIS — I059 Rheumatic mitral valve disease, unspecified: Secondary | ICD-10-CM | POA: Insufficient documentation

## 2010-03-23 DIAGNOSIS — E669 Obesity, unspecified: Secondary | ICD-10-CM | POA: Insufficient documentation

## 2010-03-23 DIAGNOSIS — I5022 Chronic systolic (congestive) heart failure: Secondary | ICD-10-CM

## 2010-03-23 DIAGNOSIS — I10 Essential (primary) hypertension: Secondary | ICD-10-CM

## 2010-03-23 NOTE — Assessment & Plan Note (Signed)
Summary: per check out/sf   Visit Type:  Follow-up Primary Cristela Stalder:  [name] DO  CC:  HTN and Cardiomyopathy.  History of Present Illness: The patient presents for followup of difficulty controlling hypertension and cardiomyopathy. Since I last saw him he has actually been feeling well. He has been having no shortness of breath, PND or orthopnea. He has had no chest pressure, neck or arm discomfort. He has felt no palpitations, presyncope or syncope. He says he has been compliant with his medications. He has had no weight gain or edema.  Current Medications (verified): 1)  Aspirin 81 Mg Tbec (Aspirin) .... Take 1 Tablet By Mouth Once A Day 2)  Amiodarone Hcl 200 Mg Tabs (Amiodarone Hcl) .Marland Kitchen.. 1 By Mouth Daily 3)  Carvedilol 25 Mg Tabs (Carvedilol) .... One Twice Day 4)  Furosemide 40 Mg Tabs (Furosemide) .... Take 1 Tablet By Mouth Once A Day- Hold 5)  Amlodipine Besylate 5 Mg Tabs (Amlodipine Besylate) .Marland Kitchen.. 1 By Mouth Daily 6)  Warfarin Sodium 5 Mg Tabs (Warfarin Sodium) .... Take 2 Tablets By Mouth Once Daily On 11/1 and 11/2. Then Take As Directed By Dr. Elie Confer, After You Are Evaluated At [facility]. 7)  Humulin N 100 Unit/ml Susp (Insulin Isophane Human) .... Inject 15 Units Under The Skin Before Your Dinner and 10 Units Before Breakfast 8)  Insulin Syringe 31g X 5/16" 0.3 Ml Misc (Insulin Syringe-Needle U-100) .... Use Once Daily To Inject Insulin 9)  Lisinopril 40 Mg Tabs (Lisinopril) .Marland Kitchen.. 1 By Mouth Two Times A Day  Allergies (verified): 1)  ! Codeine  Past History:  Past Medical History: Reviewed history from 12/29/2009 and no changes required. Diabetes Hypertension Atrial fibrillation Cardiomyopathy Cerebrovascular accident   Past Surgical History: Reviewed history from 12/29/2009 and no changes required. Ear surgery as a child.   Review of Systems       As stated in the HPI and negative for all other systems.   Vital Signs:  Patient  profile:   55 year old male Height:      70 inches Weight:      202 pounds BMI:     29.09 Pulse rate:   69 / minute Resp:     16 per minute BP sitting:   222 / 112  (right arm)  Vitals Entered By: Levora Angel, CNA (February 03, 2010 2:40 PM)  Serial Vital Signs/Assessments:  Time      Position  BP       Pulse  Resp  Temp     By                     194/112                        Levora Angel, CNA                     146/92                         Sim Boast, RN   Physical Exam  General:  Well developed, well nourished, in no acute distress. Head:  normocephalic and atraumatic Eyes:  PERRLA/EOM intact; conjunctiva and lids normal. Mouth:  Poor dentition. Oral mucosa normal. Neck:  Neck supple, no JVD. No masses, thyromegaly or abnormal cervical nodes. Chest Wall:  no deformities or breast masses noted Lungs:  Clear bilaterally to auscultation and percussion. Heart:  S1 and S2 within normal limits, positive S3, no S4, no clicks, no rubs Abdomen:  Bowel sounds positive; abdomen soft and non-tender without masses, organomegaly, or hernias noted. No hepatosplenomegaly. Msk:  Back normal, normal gait. Muscle strength and tone normal. Extremities:  No clubbing or cyanosis. Neurologic:  Alert and oriented x 3. Skin:  Intact without lesions or rashes. Cervical Nodes:  no significant adenopathy Psych:  Normal affect.   Impression & Recommendations:  Problem # 1:  ESSENTIAL HYPERTENSION, BENIGN (ICD-401.1) at surgery. Accelerated and brought it down today I will increase his amlodipine to 10 mg daily.  I will add Aldactone 25 mg daily and check his basic metabolic profile again in one week  Problem # 2:  HYPERKALEMIA (ICD-276.7) This was an acute issue. Watch this very closely starting spironolactone.  Problem # 3:  CONGESTIVE HEART FAILURE, SYSTOLIC DYSFUNCTION (A999333) I will titrate the meds as listed.  He iis euvolemic today.  Problem # 4:  ATRIAL FIBRILLATION  WITH RAPID VENTRICULAR RESPONSE (ICD-427.31) He is maintaining NSR on amiodarone and he will continue the current dose.  Patient Instructions: 1)  Your physician recommends that you schedule a follow-up appointment in: 2 weeks with Richardson Dopp, PA 2)  Your physician has recommended you make the following change in your medication: Increase Amlodipine to 10 mg a day and start Spironolactone 25 mg a day 3)  Your physician recommends that you return for lab work in:  1 week for BMP  401.1  v58.69 Prescriptions: SPIRONOLACTONE 25 MG TABS (SPIRONOLACTONE) once a day  #30 x 11   Entered by:   Sim Boast, RN   Authorized by:   Minus Breeding, MD, [facility]   Signed by:   Sim Boast, RN on 02/03/2010   Method used:   Electronically to        [facility] [phone]* (retail)       [place], [place]  [address]       Ph: [phone]       Fax: [phone]   RxIDXC:[id number] AMLODIPINE BESYLATE 10 MG TABS (AMLODIPINE BESYLATE) one daily  #30 x 11   Entered by:   Sim Boast, RN   Authorized by:   Minus Breeding, MD, [facility]   Signed by:   Sim Boast, RN on 02/03/2010   Method used:   Electronically to        [facility] [phone]* (retail)       [address]       [place], [place]  [address]       Ph: [phone]       Fax: [phone]   RxIDVA:[id number]

## 2010-03-23 NOTE — Assessment & Plan Note (Addendum)
Summary: COU/SB.  Anticoagulant Therapy Managed by: Dorene Grebe. Ceasar Lund  PharmD CACP Referring MD: J. Hochrein,MD PCP: Simms Attending: Lazarus Salines, [name] Indication 1: Atrial fibrillation Indication 2: Encounter for therapeutic drug monitoring  V58.83 Start date: 12/16/2009  Patient Assessment Reviewed by: Paulla Dolly PharmD  March 13, 2010 Medication review: verified warfarin dosage & schedule,verified previous prescription medications, verified doses & any changes, verified new medications, reviewed OTC medications, reviewed OTC health products-vitamins supplements etc Complications: none Dietary changes: none   Health status changes: none   Lifestyle changes: none   Recent/future hospitalizations: none   Recent/future procedures: none   Recent/future dental: none Patient Assessment Part 2:  Have you MISSED ANY DOSES or CHANGED TABLETS?  YES. States he has missed the entire past week OFF of warfarin.  Have you had any BRUISING or BLEEDING ( nose or gum bleeds,blood in urine or stool)?  No reported bruising or bleeding in nose, gums, urine, stool.  Have you STARTED or STOPPED any MEDICATIONS, including OTC meds,herbals or supplements?  No other medications or herbal supplements were started or stopped.  Have you CHANGED your DIET, especially green vegetables,or ALCOHOL intake?  No changes in diet or alcohol intake.  Have you had any ILLNESSES or HOSPITALIZATIONS?  No reported illnesses or hospitalizations  Have you had any signs of CLOTTING?(chest discomfort,dizziness,shortness of breath,arms tingling,slurred speech,swelling or redness in leg)    No chest discomfort, dizziness, shortness of breath, tingling in arm, slurred speech, swelling, or redness in leg.     Treatment  Target INR: 2.0-3.0 INR: 1.0  Date: 03/13/2010 Regimen In:  62.5mg /week INR reflects regimen in: 1.0  New  Tablet strength: : 5mg  Regimen Out:     Sunday: 2 Tablet  Monday: 1 & 1/2 Tablet     Tuesday: 2 Tablet     Wednesday: 1 & 1/2 Tablet     Thursday: 2 Tablet      Friday: 1 & 1/2 Tablet     Saturday: 2 Tablet Total Weekly: 62.5mg/week mg  Next INR Due: 04/03/2010 Adjusted by: Laurianne Floresca B. Ilhan Debenedetto III PharmD CACP   Return to anticoagulation clinic:  04/03/2010 Time of next visit: 1515    Allergies: 1)  ! Codeine Prescriptions: WARFARIN SODIUM 5 MG TABS (WARFARIN SODIUM) take 2 tablets by mouth once daily on 11/1 and 11/2. Then take as directed by Dr. Haseeb Fiallos, after you are evaluated at the [facility].  #60 x 1   Entered by:   Jay Charmine Bockrath PharmD   Authorized by:   [name] Golding  DO   Signed by:   Jay Elsey Holts PharmD on 03/13/2010   Method used:   Electronically to        [facility] [id number]* (retail)       [address], [place]  [address]       Ph: [phone]       Fax: [phone]   RxID:   [id number] WARFARIN SODIUM 5 MG TABS (WARFARIN SODIUM) take 2 tablets by mouth once daily on 11/1 and 11/2. Then take as directed by Dr. Elie Confer, after you are evaluated at the [facility].  #60 x 1   Entered by:   Paulla Dolly PharmD   Authorized by:   Rhea Pink  DO   Signed by:   Paulla Dolly PharmD on 03/13/2010   Method used:   Electronically to        [facility] [phone]* (retail)       [address]       [place], [place]  [address]       Ph: [phone]       Fax: [phone]   RxID:   [id number]

## 2010-03-23 NOTE — Letter (Signed)
Summary: [place]   Imported By: Garlan Fillers 02/21/2010 10:17:38  _____________________________________________________________________  External Attachment:    Type:   Image     Comment:   External Document

## 2010-03-23 NOTE — Assessment & Plan Note (Signed)
Summary: per check out f/u to med change/saf   Primary Provider:  [name] DO  CC:  no cardiac complaints.  History of Present Illness: Primary Cardiologist:  Dr. Minus Breeding  Brian [name] is a 55 year old male with history of dilated cardiomyopathy with an ejection fraction of 25%, persistent atrial fibrillation maintaining sinus rhythm on amiodarone therapy, diabetes, hypertension and prior history of stroke.  He recently followed up with Dr. Percival Spanish.  He had significantly elevated blood pressure and his medications were adjusted.  Amlodipine was increased and spironolactone was added.  Followup basic metabolic panel on December 21 demonstrates a potassium of 4.8, BUN 30, creatinine 1.5.  He returns for followup.  His blood pressure is much better.  He denies chest pain, shortness of breath, syncope, orthopnea, PND or edema.  He denies palpitations.  He describes NYHA class 2 symptoms.  Current Medications (verified): 1)  Aspirin 81 Mg Tbec (Aspirin) .... Take 1 Tablet By Mouth Once A Day 2)  Amiodarone Hcl 200 Mg Tabs (Amiodarone Hcl) .Marland Kitchen.. 1 By Mouth Daily 3)  Carvedilol 25 Mg Tabs (Carvedilol) .... One Twice Day 4)  Furosemide 40 Mg Tabs (Furosemide) .... Take 1 Tablet By Mouth Once A Day- Hold 5)  Amlodipine Besylate 10 Mg Tabs (Amlodipine Besylate) .... One Daily 6)  Warfarin Sodium 5 Mg Tabs (Warfarin Sodium) .... Take 2 Tablets By Mouth Once Daily On 11/1 and 11/2. Then Take As Directed By Dr. Elie Confer, After You Are Evaluated At The [facility]. 7)  Humulin N 100 Unit/ml Susp (Insulin Isophane Human) .... Inject 15 Units Under The Skin Before Your Dinner and 10 Units Before Breakfast 8)  Insulin Syringe 31g X 5/16" 0.3 Ml Misc (Insulin Syringe-Needle U-100) .... Use Once Daily To Inject Insulin 9)  Lisinopril 40 Mg Tabs (Lisinopril) .Marland Kitchen.. 1 By Mouth Two Times A Day 10)  Spironolactone 25 Mg Tabs (Spironolactone) .... Once A Day  Allergies (verified): 1)  !  Codeine  Past History:  Past Medical History: Last updated: 12/29/2009 Diabetes Hypertension Atrial fibrillation Cardiomyopathy Cerebrovascular accident   Review of Systems       As per  the HPI.  All other systems reviewed and negative.   Vital Signs:  Patient profile:   55 year old male Height:      70 inches Weight:      206.50 pounds BMI:     29.74 Pulse rate:   61 / minute BP sitting:   124 / 82  (right arm) Cuff size:   regular  Vitals Entered By: Arnell Sieving, CMA (February 22, 2010 10:23 AM)  Physical Exam  General:  Well nourished, well developed, in no acute distress HEENT: normal Neck: no JVD Cardiac:  normal S1, S2; RRR; no murmur Lungs:  clear to auscultation bilaterally, no wheezing, rhonchi or rales Abd: soft, nontender, no hepatomegaly Ext: no  edema Vascular: no carotid  bruits Skin: warm and dry Neuro:  CNs 2-12 intact, no focal abnormalities noted    EKG  Procedure date:  02/22/2010  Findings:      Normal Sinus Rhythm Heart rate 61 Normal axis T-wave inversions in leads one, aVL, V5 and V6 LVH Poor R-wave progression No significant change when compared to prior tracings.  Impression & Recommendations:  Problem # 1:  ESSENTIAL HYPERTENSION, BENIGN (ICD-401.1) Assessment Improved  Much better controlled. Continue current meds. Check repeat bmet to ensure K+ and renal fxn stable.  Orders: TLB-BMP (Basic Metabolic Panel-BMET) (99991111)  Problem # 2:  CONGESTIVE HEART FAILURE, SYSTOLIC DYSFUNCTION (A999333) He is on a good medical regimen. Discussed with Dr. Percival Spanish. Will arrange Lexiscan Myoview now that his pressure is better to further evaluate his myopathy.  Other Orders: EKG w/ Interpretation (93000) Nuclear Stress Test (Nuc Stress Test)  Patient Instructions: 1)  Your physician recommends that you schedule a follow-up appointment in:  on 2/2/121 @ 10:45 am to see Dr. Percival Spanish 2)  Your physician recommends  that you return for lab work in: Today for a BMET 401.1 3)  Your physician has requested that you have an [place].  For further information please visit [email or web address].  Please follow instruction sheet, as given.

## 2010-03-23 NOTE — Letter (Signed)
Summary: Generic Letter  Press photographer, [place]  [address] [place]   [place], [place] [address]   Phone: [phone]  Fax: [phone]        March 02, 2010 MRN: [id number]    Brian [name] [address] [place], [name]  [address]    Dear [name],   You are scheduled to see Dr. Percival Spanish on 04/02/10.  Richardson Dopp, Pa has scheduled you to have an Echocardiogram at 11:30 and see Dr. Percival Spanish after the test the same day.   If you have any question please call [phone].    incerely,  [name]  This letter has been electronically signed by your physician.

## 2010-03-23 NOTE — Progress Notes (Signed)
Summary: Nuc. Pre-Procedure  Phone Note Outgoing Call Call back at [facility] Phone [phone]   Call placed by: Irven Baltimore, RN,  February 27, 2010 1:51 PM Summary of Call: Reviewed information on My[employer] Information Sheet (see scanned document for further details).  Spoke with patient.     Nuclear Med Background Indications for Stress Test: Evaluation for Ischemia   History: Abnormal EKG, Echo  History Comments: 10/11 Echo:25-30%, mod.MR. Hx. chronic AFIB,CM/CHF.     Nuclear Pre-Procedure Cardiac Risk Factors: CVA, Hypertension, IDDM Type 2, Lipids Height (in): 70

## 2010-03-23 NOTE — Progress Notes (Signed)
Summary: pt rtn call  Phone Note Call from Patient Call back at Home Phone [phone]   Caller: Patient Reason for Call: Talk to Nurse, Talk to Doctor Summary of Call: pt wanted to let you know he got your message and he will follow your instructions Initial call taken by: Shelda Pal,  February 23, 2010 2:50 PM  Follow-up for Phone Call        noted Follow-up by: Sim Boast, RN,  February 23, 2010 3:24 PM

## 2010-03-23 NOTE — Assessment & Plan Note (Signed)
Summary: Cardiology Nuclear Testing  Nuclear Med Background Indications for Stress Test: Evaluation for Ischemia, Post Hospital  Indications Comments: 12/19/09 Post hospital with CVA and tx with TPA  History: Abnormal EKG, Echo  History Comments: 10/11 Echo:25-30%, mod.MR. Hx. chronic AFIB,CM/CHF.  Symptoms: Fatigue, Rapid HR    Nuclear Pre-Procedure Cardiac Risk Factors: CVA, Hypertension, IDDM Type 2, Lipids, Overweight Caffeine/Decaff Intake: None NPO After: 8:30 AM IV 0.9% NS with Angio Cath: 20g     IV Site: R Antecubital IV Started by: Eliezer Lofts, EMT-P Chest Size (in) 42     Height (in): 70 Weight (lb): 217 BMI: 31.25  Nuclear Med Study 1 or 2 day study:  1 day     Stress Test Type:  Carlton Adam Reading MD:  Mertie Moores, MD     Referring MD:  Lenna Sciara. Hochrein,MD Resting Radionuclide:  Technetium 21m Tetrofosmin     Resting Radionuclide Dose:  11 mCi  Stress Radionuclide:  Technetium 50m Tetrofosmin     Stress Radionuclide Dose:  33 mCi   Stress Protocol  Max Systolic BP: Q000111Q mm Hg Lexiscan: 0.4 mg   Stress Test Technologist:  Matilde Haymaker, RN     Nuclear Technologist:  Charlton Amor, CNMT  Rest Procedure  Myocardial perfusion imaging was performed at rest 45 minutes following the intravenous administration of Technetium 35m Tetrofosmin.  Stress Procedure  The patient received IV Lexiscan 0.4 mg over 15-seconds. Patient was not exercised at low level due BP 163/104 and history of recent CVA. Technetium 32m Tetrofosmin injected at 30-seconds.  There were no significant changes with infusion. Patient had no chest pain and no ectopy with stress. Quantitative spect images were obtained after a 45 minute delay.  QPS Raw Data Images:  Normal; no motion artifact; normal heart/lung ratio. Stress Images:  Normal homogeneous uptake in all areas of the myocardium. Rest Images:  Normal homogeneous uptake in all areas of the myocardium. Subtraction (SDS):  No evidence of  ischemia. Transient Ischemic Dilatation:  .97  (Normal <1.22)  Lung/Heart Ratio:  .29  (Normal <0.45)  Quantitative Gated Spect Images QGS EDV:  143 ml QGS ESV:  61 ml QGS EF:  57 % QGS cine images:  Normal LV function.  Findings Normal nuclear study      Overall Impression  BP Response: Normal blood pressure response. Clinical Symptoms: No chest pain ECG Impression: No significant ST segment change suggestive of ischemia. Overall Impression: Normal stress nuclear study. Overall Impression Comments: No evidence of ischemia. Normal LV function.  Appended Document: Cardiology Nuclear Testing no ischemia or scar EF now normal on nuclear scan appears to be a nonischemic CM . Marland Kitchen . ? tachycardia mediated schedule echo to confirm EF copy to Dr. Percival Spanish  Appended Document: Cardiology Nuclear Testing left msg for c/b Joan Flores, RN, BSN    Appended Document: Orders Update adv pt of results and is agreeable to echocardiogram.   Clinical Lists Changes  Orders: Added new Referral order of Echocardiogram (Echo) - Signed      Appended Document: Cardiology Nuclear Testing EF looks better.  No ischemia.

## 2010-03-29 NOTE — Assessment & Plan Note (Signed)
Summary: f47m.cmf/ as per scott weaver/rs from [facility].gd/sp   Vital Signs:  Patient profile:   55 year old male Height:      70 inches Weight:      208 pounds BMI:     29.95 Pulse rate:   70 / minute Resp:     16 per minute BP sitting:   164 / 98  (right arm)  Vitals Entered By: Levora Angel, CNA (March 23, 2010 12:20 PM)  Visit Type:  Follow-up Primary Provider:  Dr. Dennard Schaumann  CC:  Cardiomyopathy and HTN.  History of Present Illness: The patient presents for evaluation of his cardiomyopathy. Since I last saw him he has done very well. He is walking routinely for exercise. He denies any palpitations, presyncope or syncope. He has had no chest discomfort, neck or arm discomfort. He has had no shortness of breath, PND or orthopnea. He has had no weight gain or edema.  Current Medications (verified): 1)  Aspirin 81 Mg Tbec (Aspirin) .... Take 1 Tablet By Mouth Once A Day 2)  Amiodarone Hcl 200 Mg Tabs (Amiodarone Hcl) .Marland Kitchen.. 1 By Mouth Daily 3)  Carvedilol 25 Mg Tabs (Carvedilol) .... One Twice Day 4)  Furosemide 40 Mg Tabs (Furosemide) .... Take 1 Tablet By Mouth Once A Day- Hold 5)  Amlodipine Besylate 10 Mg Tabs (Amlodipine Besylate) .... One Daily 6)  Warfarin Sodium 5 Mg Tabs (Warfarin Sodium) .... Take 2 Tablets By Mouth Once Daily On 11/1 and 11/2. Then Take As Directed By Dr. Elie Confer, After You Are Evaluated At [facility]. 7)  Humulin N 100 Unit/ml Susp (Insulin Isophane Human) .... Inject 15 Units Under The Skin Before Your Dinner and 10 Units Before Breakfast 8)  Insulin Syringe 31g X 5/16" 0.3 Ml Misc (Insulin Syringe-Needle U-100) .... Use Once Daily To Inject Insulin 9)  Lisinopril 40 Mg Tabs (Lisinopril) .Marland Kitchen.. 1 By Mouth Two Times A Day 10)  Spironolactone 25 Mg Tabs (Spironolactone) .... Once A Day  Allergies (verified): 1)  ! Codeine  Past History:  Past Medical History: Reviewed history from 12/29/2009 and no changes  required. Diabetes Hypertension Atrial fibrillation Cardiomyopathy Cerebrovascular accident   Past Surgical History: Reviewed history from 12/29/2009 and no changes required. Ear surgery as a child.   Review of Systems       As stated in the HPI and negative for all other systems.  Physical Exam  General:  Well developed, well nourished, in no acute distress. Head:  normocephalic and atraumatic Neck:  Neck supple, no JVD. No masses, thyromegaly or abnormal cervical nodes. Chest Wall:  no deformities or breast masses noted Lungs:  Clear bilaterally to auscultation and percussion. Abdomen:  Bowel sounds positive; abdomen soft and non-tender without masses, organomegaly, or hernias noted. No hepatosplenomegaly. Msk:  Back normal, normal gait. Muscle strength and tone normal. Extremities:  No clubbing or cyanosis. Neurologic:  Alert and oriented x 3. Skin:  Intact without lesions or rashes. Cervical Nodes:  no significant adenopathy Inguinal Nodes:  no significant adenopathy Psych:  Normal affect.   Detailed Cardiovascular Exam  Neck    Carotids: Carotids full and equal bilaterally without bruits.      Neck Veins: Normal, no JVD.    Heart    Inspection: no deformities or lifts noted.      Palpation: normal PMI with no thrills palpable.      Auscultation: regular rate and rhythm, S1, S2 without murmurs, rubs, gallops, or clicks.    Vascular  Abdominal Aorta: no palpable masses, pulsations, or audible bruits.      Femoral Pulses: normal femoral pulses bilaterally.      Pedal Pulses: 2+pulses b/l.    Radial Pulses: normal radial pulses bilaterally.      Peripheral Circulation: no clubbing, cyanosis, or edema noted with normal capillary refill.     Impression & Recommendations:  Problem # 1:  CONGESTIVE HEART FAILURE, SYSTOLIC DYSFUNCTION ([id number])  He had an echocardiogram today I will followup on this. He has no symptoms at this point and his meds will remain as  listed.  His updated medication list for this problem includes:    Aspirin 81 Mg Tbec (Aspirin) .Marland Kitchen... Take 1 tablet by mouth once a day    Amiodarone Hcl 200 Mg Tabs (Amiodarone hcl) .Marland Kitchen... 1 by mouth daily    Carvedilol 25 Mg Tabs (Carvedilol) ..... One twice day    Furosemide 40 Mg Tabs (Furosemide) .Marland Kitchen... Take 1 tablet by mouth once a day- hold    Amlodipine Besylate 10 Mg Tabs (Amlodipine besylate) ..... One daily    Warfarin Sodium 5 Mg Tabs (Warfarin sodium) .Marland Kitchen... Take 2 tablets by mouth once daily on 11/1 and 11/2. then take as directed by dr. Elie Confer, after you are evaluated at the coumadin clinic.    Lisinopril 40 Mg Tabs (Lisinopril) .Marland Kitchen... 1 by mouth two times a day    Spironolactone 25 Mg Tabs (Spironolactone) ..... Once a day  Problem # 2:  ESSENTIAL HYPERTENSION, BENIGN (ICD-401.1)  His blood pressure is still not at target. However, he says that his primary provider just gave him a new prescription for diuretic to 80 and blood pressure control and he has not yet started this. I will defer this management and we will otherwise titrate meds as necessary pending future blood pressures.  His updated medication list for this problem includes:    Aspirin 81 Mg Tbec (Aspirin) .Marland Kitchen... Take 1 tablet by mouth once a day    Carvedilol 25 Mg Tabs (Carvedilol) ..... One twice day    Furosemide 40 Mg Tabs (Furosemide) .Marland Kitchen... Take 1 tablet by mouth once a day- hold    Amlodipine Besylate 10 Mg Tabs (Amlodipine besylate) ..... One daily    Lisinopril 40 Mg Tabs (Lisinopril) .Marland Kitchen... 1 by mouth two times a day    Spironolactone 25 Mg Tabs (Spironolactone) ..... Once a day  Problem # 3:  ATRIAL FIBRILLATION WITH RAPID VENTRICULAR RESPONSE (ICD-427.31) He seems to be maintaining sinus rhythm. However, with his past CVA and other risk factors he is at very high risk for thromboembolic events. He will continue meds as listed including Coumadin.  Patient Instructions: 1)  Your physician recommends that you  continue on your current medications as directed. Please refer to the Current Medication list given to you today. 2)  Your physician wants you to follow-up in:3 months with dr Percival Spanish  You will receive a reminder letter in the mail two months in advance. If you don't receive a letter, please call our office to schedule the follow-up appointment.

## 2010-03-29 NOTE — Miscellaneous (Signed)
  Clinical Lists Changes  Observations: Added new observation of NUCLEAR NOS: BP Response: Normal blood pressure response. Clinical Symptoms: No chest pain ECG Impression: No significant ST segment change suggestive of ischemia. Overall Impression: Normal stress nuclear study. Overall Impression Comments: No evidence of ischemia. Normal LV function.  (02/28/2010 15:09)      Nuclear Study  Procedure date:  02/28/2010  Findings:      BP Response: Normal blood pressure response. Clinical Symptoms: No chest pain ECG Impression: No significant ST segment change suggestive of ischemia. Overall Impression: Normal stress nuclear study. Overall Impression Comments: No evidence of ischemia. Normal LV function.

## 2010-04-02 ENCOUNTER — Encounter: Payer: Self-pay | Admitting: Internal Medicine

## 2010-04-03 ENCOUNTER — Ambulatory Visit (INDEPENDENT_AMBULATORY_CARE_PROVIDER_SITE_OTHER): Payer: Medicare PPO | Admitting: Pharmacist

## 2010-04-03 DIAGNOSIS — I6350 Cerebral infarction due to unspecified occlusion or stenosis of unspecified cerebral artery: Secondary | ICD-10-CM

## 2010-04-03 DIAGNOSIS — Z7901 Long term (current) use of anticoagulants: Secondary | ICD-10-CM

## 2010-04-03 DIAGNOSIS — I4891 Unspecified atrial fibrillation: Secondary | ICD-10-CM

## 2010-04-03 LAB — PROTIME-INR: INR: 1.4 — AB (ref <=1.1)

## 2010-04-03 NOTE — Patient Instructions (Signed)
Patient instructed to take medications as defined in the Anti-coagulation Track section of this encounter.  Patient instructed to take today's dose.  Patient verbalized understanding of these instructions.    

## 2010-04-03 NOTE — Progress Notes (Signed)
Anti-Coagulation Progress Note  Brian [name] is a 55 y.o. male who is currently on an anti-coagulation regimen.    RECENT RESULTS: Recent results are below, the most recent result is correlated with a dose of 62.5 mg. per week: Lab Results  Component Value Date   INR 1.4* 04/03/2010   INR 1.0 03/13/2010   INR 2.1 01/16/2010    ANTI-COAG DOSE:   Latest dosing instructions   Total Sun Mon Tue Wed Thu Fri Sat   75 10 mg 12.5 mg 10 mg 10 mg 12.5 mg 10 mg 10 mg    (5 mg2) (5 mg2.5) (5 mg2) (5 mg2) (5 mg2.5) (5 mg2) (5 mg2)         ANTICOAG SUMMARY: Anticoagulation Episode Summary              Current INR goal 2.0-3.0 Next INR check 04/24/2010   INR from last check 1.4! (04/03/2010)     Weekly max dose (mg)  Target end date    Indications ATRIAL FIBRILLATION WITH RAPID VENTRICULAR RESPONSE, CEREBROVASCULAR ACCIDENT, Long term current use of anticoagulant   INR check location [facility] Preferred lab    Send INR reminders to [facility]   Comments        Provider Role Specialty Phone number   Larey Dresser  Internal Medicine [phone]        ANTICOAG TODAY: Anticoagulation Summary as of 04/03/2010              INR goal 2.0-3.0     Selected INR 1.4! (04/03/2010) Next INR check 04/24/2010   Weekly max dose (mg)  Target end date    Indications ATRIAL FIBRILLATION WITH RAPID VENTRICULAR RESPONSE, CEREBROVASCULAR ACCIDENT, Long term current use of anticoagulant    Anticoagulation Episode Summary              INR check location [facility] Preferred lab    Send INR reminders to [facility]   Comments        Provider Role Specialty Phone number   Larey Dresser  Internal Medicine [phone]        PATIENT INSTRUCTIONS: Patient Instructions  Patient instructed to take medications as defined in the Anti-coagulation Track section of this encounter.  Patient instructed to take today's dose.  Patient verbalized understanding of these  instructions.        FOLLOW-UP Return in 3 weeks (on 04/24/2010).  Jorene Guest, III Pharm.D., CACP

## 2010-04-24 ENCOUNTER — Ambulatory Visit (INDEPENDENT_AMBULATORY_CARE_PROVIDER_SITE_OTHER): Payer: Medicare PPO | Admitting: Pharmacist

## 2010-04-24 DIAGNOSIS — I6350 Cerebral infarction due to unspecified occlusion or stenosis of unspecified cerebral artery: Secondary | ICD-10-CM

## 2010-04-24 DIAGNOSIS — I4891 Unspecified atrial fibrillation: Secondary | ICD-10-CM

## 2010-04-24 DIAGNOSIS — Z7901 Long term (current) use of anticoagulants: Secondary | ICD-10-CM

## 2010-04-24 LAB — POCT INR: INR: 1.4

## 2010-04-24 NOTE — Patient Instructions (Signed)
Patient instructed to take medications as defined in the Anti-coagulation Track section of this encounter.  Patient instructed to OMIT today's dose. (He has already taken today's dose) Patient verbalized understanding of these instructions.

## 2010-04-24 NOTE — Progress Notes (Signed)
Anti-Coagulation Progress Note  Brian BUSSIE is a 55 y.o. male who is currently on an anti-coagulation regimen.    RECENT RESULTS: Recent results are below, the most recent result is correlated with a dose of 75 mg. per week: Lab Results  Component Value Date   INR 1.4 04/24/2010   INR 1.4* 04/03/2010   INR 1.0 03/13/2010    ANTI-COAG DOSE:   Latest dosing instructions   Total Sun Mon Tue Wed Thu Fri Sat   87.5 12.5 mg 12.5 mg 12.5 mg 12.5 mg 12.5 mg 12.5 mg 12.5 mg    (5 mg2.5) (5 mg2.5) (5 mg2.5) (5 mg2.5) (5 mg2.5) (5 mg2.5) (5 mg2.5)         ANTICOAG SUMMARY: Anticoagulation Episode Summary              Current INR goal 2.0-3.0 Next INR check 05/08/2010   INR from last check 1.4! (04/24/2010)     Weekly max dose (mg)  Target end date Indefinite   Indications ATRIAL FIBRILLATION WITH RAPID VENTRICULAR RESPONSE, CEREBROVASCULAR ACCIDENT, Long term current use of anticoagulant   INR check location [facility] Preferred lab    Send INR reminders to [facility]   Comments        Provider Role Specialty Phone number   Larey Dresser  Internal Medicine [phone]        ANTICOAG TODAY: Anticoagulation Summary as of 04/24/2010              INR goal 2.0-3.0     Selected INR 1.4! (04/24/2010) Next INR check 05/08/2010   Weekly max dose (mg)  Target end date Indefinite   Indications ATRIAL FIBRILLATION WITH RAPID VENTRICULAR RESPONSE, CEREBROVASCULAR ACCIDENT, Long term current use of anticoagulant    Anticoagulation Episode Summary              INR check location [facility] Preferred lab    Send INR reminders to [facility]   Comments        Provider Role Specialty Phone number   Larey Dresser  Internal Medicine [phone]        PATIENT INSTRUCTIONS: Patient Instructions  Patient instructed to take medications as defined in the Anti-coagulation Track section of this encounter.  Patient instructed to OMIT today's dose. (He has already  taken today's dose) Patient verbalized understanding of these instructions.        FOLLOW-UP Return in 2 weeks (on 05/08/2010) for Follow up INR.  Jorene Guest, III Pharm.D., CACP

## 2010-05-02 LAB — GLUCOSE, CAPILLARY: Glucose-Capillary: 178 mg/dL — ABNORMAL HIGH (ref 70–99)

## 2010-05-02 LAB — BASIC METABOLIC PANEL
BUN: 15 mg/dL (ref 6–23)
CO2: 28 mEq/L (ref 19–32)
Calcium: 9.1 mg/dL (ref 8.4–10.5)
Chloride: 101 mEq/L (ref 96–112)
Creatinine, Ser: 1.29 mg/dL (ref 0.4–1.5)
GFR calc Af Amer: >60 mL/min (ref >60)
GFR calc non Af Amer: 58 mL/min — ABNORMAL LOW (ref >60)
Glucose, Bld: 116 mg/dL — ABNORMAL HIGH (ref 70–99)
Potassium: 4.2 mEq/L (ref 3.5–5.1)
Sodium: 137 mEq/L (ref 135–145)

## 2010-05-02 LAB — PROTIME-INR
INR: 2.03 — ABNORMAL HIGH (ref 0.00–1.49)
Prothrombin Time: 23.1 seconds — ABNORMAL HIGH (ref 11.6–15.2)

## 2010-05-03 LAB — CBC
HCT: 43 % (ref 39.0–52.0)
Hemoglobin: 13.7 g/dL (ref 13.0–17.0)
Hemoglobin: 14.3 g/dL (ref 13.0–17.0)
Hemoglobin: 15.2 g/dL (ref 13.0–17.0)
MCH: 29.5 pg (ref 26.0–34.0)
MCHC: 33.2 g/dL (ref 30.0–36.0)
MCHC: 33.3 g/dL (ref 30.0–36.0)
MCHC: 33.4 g/dL (ref 30.0–36.0)
MCHC: 35 g/dL (ref 30.0–36.0)
MCV: 84.1 fL (ref 78.0–100.0)
MCV: 85.7 fL (ref 78.0–100.0)
Platelets: 256 10*3/uL (ref 150–400)
Platelets: 278 10*3/uL (ref 150–400)
Platelets: 291 10*3/uL (ref 150–400)
Platelets: 317 10*3/uL (ref 150–400)
RBC: 4.95 MIL/uL (ref 4.22–5.81)
RBC: 5.01 MIL/uL (ref 4.22–5.81)
RDW: 13.1 % (ref 11.5–15.5)
RDW: 13.1 % (ref 11.5–15.5)
RDW: 13.3 % (ref 11.5–15.5)
RDW: 13.4 % (ref 11.5–15.5)
RDW: 13.4 % (ref 11.5–15.5)
WBC: 10.8 10*3/uL — ABNORMAL HIGH (ref 4.0–10.5)
WBC: 12.9 10*3/uL — ABNORMAL HIGH (ref 4.0–10.5)
WBC: 7.7 10*3/uL (ref 4.0–10.5)
WBC: 9.2 10*3/uL (ref 4.0–10.5)

## 2010-05-03 LAB — HIV ANTIBODY (ROUTINE TESTING W REFLEX): HIV: NONREACTIVE

## 2010-05-03 LAB — GLUCOSE, CAPILLARY
Glucose-Capillary: 119 mg/dL — ABNORMAL HIGH (ref 70–99)
Glucose-Capillary: 145 mg/dL — ABNORMAL HIGH (ref 70–99)
Glucose-Capillary: 145 mg/dL — ABNORMAL HIGH (ref 70–99)
Glucose-Capillary: 148 mg/dL — ABNORMAL HIGH (ref 70–99)
Glucose-Capillary: 154 mg/dL — ABNORMAL HIGH (ref 70–99)
Glucose-Capillary: 155 mg/dL — ABNORMAL HIGH (ref 70–99)
Glucose-Capillary: 157 mg/dL — ABNORMAL HIGH (ref 70–99)
Glucose-Capillary: 163 mg/dL — ABNORMAL HIGH (ref 70–99)
Glucose-Capillary: 187 mg/dL — ABNORMAL HIGH (ref 70–99)
Glucose-Capillary: 194 mg/dL — ABNORMAL HIGH (ref 70–99)
Glucose-Capillary: 196 mg/dL — ABNORMAL HIGH (ref 70–99)
Glucose-Capillary: 199 mg/dL — ABNORMAL HIGH (ref 70–99)
Glucose-Capillary: 200 mg/dL — ABNORMAL HIGH (ref 70–99)
Glucose-Capillary: 215 mg/dL — ABNORMAL HIGH (ref 70–99)
Glucose-Capillary: 222 mg/dL — ABNORMAL HIGH (ref 70–99)
Glucose-Capillary: 226 mg/dL — ABNORMAL HIGH (ref 70–99)
Glucose-Capillary: 241 mg/dL — ABNORMAL HIGH (ref 70–99)
Glucose-Capillary: 245 mg/dL — ABNORMAL HIGH (ref 70–99)
Glucose-Capillary: 247 mg/dL — ABNORMAL HIGH (ref 70–99)
Glucose-Capillary: 248 mg/dL — ABNORMAL HIGH (ref 70–99)
Glucose-Capillary: 250 mg/dL — ABNORMAL HIGH (ref 70–99)
Glucose-Capillary: 281 mg/dL — ABNORMAL HIGH (ref 70–99)
Glucose-Capillary: 298 mg/dL — ABNORMAL HIGH (ref 70–99)
Glucose-Capillary: 373 mg/dL — ABNORMAL HIGH (ref 70–99)

## 2010-05-03 LAB — COMPREHENSIVE METABOLIC PANEL
ALT: 94 U/L — ABNORMAL HIGH (ref 0–53)
AST: 42 U/L — ABNORMAL HIGH (ref 0–37)
AST: 50 U/L — ABNORMAL HIGH (ref 0–37)
AST: 64 U/L — ABNORMAL HIGH (ref 0–37)
Albumin: 3.1 g/dL — ABNORMAL LOW (ref 3.5–5.2)
Albumin: 3.2 g/dL — ABNORMAL LOW (ref 3.5–5.2)
Albumin: 3.8 g/dL (ref 3.5–5.2)
Alkaline Phosphatase: 135 U/L — ABNORMAL HIGH (ref 39–117)
Alkaline Phosphatase: 142 U/L — ABNORMAL HIGH (ref 39–117)
Alkaline Phosphatase: 161 U/L — ABNORMAL HIGH (ref 39–117)
BUN: 16 mg/dL (ref 6–23)
BUN: 18 mg/dL (ref 6–23)
CO2: 26 mEq/L (ref 19–32)
Calcium: 8.7 mg/dL (ref 8.4–10.5)
Calcium: 9.2 mg/dL (ref 8.4–10.5)
Chloride: 98 mEq/L (ref 96–112)
Chloride: 99 mEq/L (ref 96–112)
Creatinine, Ser: 1.06 mg/dL (ref 0.4–1.5)
Creatinine, Ser: 1.13 mg/dL (ref 0.4–1.5)
Creatinine, Ser: 1.33 mg/dL (ref 0.4–1.5)
GFR calc Af Amer: >60 mL/min (ref >60)
GFR calc Af Amer: >60 mL/min (ref >60)
GFR calc Af Amer: >60 mL/min (ref >60)
GFR calc non Af Amer: >60 mL/min (ref >60)
Glucose, Bld: 185 mg/dL — ABNORMAL HIGH (ref 70–99)
Glucose, Bld: 291 mg/dL — ABNORMAL HIGH (ref 70–99)
Potassium: 3.3 mEq/L — ABNORMAL LOW (ref 3.5–5.1)
Potassium: 4.1 mEq/L (ref 3.5–5.1)
Potassium: 4.6 mEq/L (ref 3.5–5.1)
Sodium: 133 mEq/L — ABNORMAL LOW (ref 135–145)
Total Bilirubin: 1 mg/dL (ref 0.3–1.2)
Total Bilirubin: 1.4 mg/dL — ABNORMAL HIGH (ref 0.3–1.2)
Total Protein: 5.7 g/dL — ABNORMAL LOW (ref 6.0–8.3)
Total Protein: 5.8 g/dL — ABNORMAL LOW (ref 6.0–8.3)
Total Protein: 6.7 g/dL (ref 6.0–8.3)

## 2010-05-03 LAB — PROTIME-INR
INR: 1.02 (ref 0.00–1.49)
INR: 1.07 (ref 0.00–1.49)
INR: 1.1 (ref 0.00–1.49)
INR: 1.11 (ref 0.00–1.49)
INR: 1.14 (ref 0.00–1.49)
Prothrombin Time: 13.6 seconds (ref 11.6–15.2)
Prothrombin Time: 14.3 seconds (ref 11.6–15.2)
Prothrombin Time: 14.4 seconds (ref 11.6–15.2)
Prothrombin Time: 14.5 seconds (ref 11.6–15.2)
Prothrombin Time: 14.8 seconds (ref 11.6–15.2)

## 2010-05-03 LAB — BASIC METABOLIC PANEL
BUN: 12 mg/dL (ref 6–23)
BUN: 13 mg/dL (ref 6–23)
BUN: 18 mg/dL (ref 6–23)
CO2: 28 mEq/L (ref 19–32)
CO2: 28 mEq/L (ref 19–32)
CO2: 30 mEq/L (ref 19–32)
CO2: 30 mEq/L (ref 19–32)
Calcium: 8.7 mg/dL (ref 8.4–10.5)
Calcium: 9 mg/dL (ref 8.4–10.5)
Calcium: 9 mg/dL (ref 8.4–10.5)
Calcium: 9.1 mg/dL (ref 8.4–10.5)
Chloride: 101 mEq/L (ref 96–112)
Creatinine, Ser: 1.48 mg/dL (ref 0.4–1.5)
GFR calc non Af Amer: 49 mL/min — ABNORMAL LOW (ref >60)
GFR calc non Af Amer: >60 mL/min (ref >60)
GFR calc non Af Amer: >60 mL/min (ref >60)
Glucose, Bld: 122 mg/dL — ABNORMAL HIGH (ref 70–99)
Glucose, Bld: 149 mg/dL — ABNORMAL HIGH (ref 70–99)
Glucose, Bld: 178 mg/dL — ABNORMAL HIGH (ref 70–99)
Glucose, Bld: 212 mg/dL — ABNORMAL HIGH (ref 70–99)
Glucose, Bld: 216 mg/dL — ABNORMAL HIGH (ref 70–99)
Glucose, Bld: 227 mg/dL — ABNORMAL HIGH (ref 70–99)
Potassium: 3.1 mEq/L — ABNORMAL LOW (ref 3.5–5.1)
Potassium: 4.4 mEq/L (ref 3.5–5.1)
Potassium: 4.7 mEq/L (ref 3.5–5.1)
Sodium: 134 mEq/L — ABNORMAL LOW (ref 135–145)
Sodium: 134 mEq/L — ABNORMAL LOW (ref 135–145)
Sodium: 135 mEq/L (ref 135–145)
Sodium: 136 mEq/L (ref 135–145)
Sodium: 137 mEq/L (ref 135–145)
Sodium: 137 mEq/L (ref 135–145)

## 2010-05-03 LAB — DIFFERENTIAL
Lymphocytes Relative: 17 % (ref 12–46)
Monocytes Absolute: 1 10*3/uL (ref 0.1–1.0)
Monocytes Relative: 9 % (ref 3–12)
Neutro Abs: 8 10*3/uL — ABNORMAL HIGH (ref 1.7–7.7)
Neutrophils Relative %: 74 % (ref 43–77)

## 2010-05-03 LAB — POCT I-STAT, CHEM 8
BUN: 18 mg/dL (ref 6–23)
Calcium, Ion: 1.09 mmol/L — ABNORMAL LOW (ref 1.12–1.32)
Chloride: 98 mEq/L (ref 96–112)
Glucose, Bld: 300 mg/dL — ABNORMAL HIGH (ref 70–99)
HCT: 47 % (ref 39.0–52.0)
Potassium: 3.5 mEq/L (ref 3.5–5.1)

## 2010-05-03 LAB — CARDIAC PANEL(CRET KIN+CKTOT+MB+TROPI)
Relative Index: INVALID (ref 0.0–2.5)
Total CK: 54 U/L (ref 7–232)
Troponin I: 0.14 ng/mL — ABNORMAL HIGH (ref 0.00–0.06)

## 2010-05-03 LAB — HEPATITIS PANEL, ACUTE
HCV Ab: NEGATIVE
Hepatitis B Surface Ag: NEGATIVE

## 2010-05-03 LAB — POCT CARDIAC MARKERS
CKMB, poc: 1.8 ng/mL (ref 1.0–8.0)
Troponin i, poc: <0.05 ng/mL (ref 0.00–0.09)

## 2010-05-03 LAB — RAPID URINE DRUG SCREEN, HOSP PERFORMED
Amphetamines: NOT DETECTED
Benzodiazepines: NOT DETECTED
Cocaine: NOT DETECTED
Tetrahydrocannabinol: POSITIVE — AB

## 2010-05-03 LAB — CK TOTAL AND CKMB (NOT AT ARMC)
CK, MB: 3.7 ng/mL (ref 0.3–4.0)
Total CK: 79 U/L (ref 7–232)

## 2010-05-03 LAB — URINALYSIS, ROUTINE W REFLEX MICROSCOPIC
Bilirubin Urine: NEGATIVE
Glucose, UA: 250 mg/dL — AB
Ketones, ur: NEGATIVE mg/dL
Protein, ur: NEGATIVE mg/dL
pH: 7 (ref 5.0–8.0)

## 2010-05-03 LAB — LIPID PANEL
Cholesterol: 185 mg/dL (ref 0–200)
LDL Cholesterol: 122 mg/dL — ABNORMAL HIGH (ref 0–99)
Triglycerides: 98 mg/dL (ref <150)
VLDL: 20 mg/dL (ref 0–40)

## 2010-05-03 LAB — HEPATIC FUNCTION PANEL
ALT: 157 U/L — ABNORMAL HIGH (ref 0–53)
AST: 77 U/L — ABNORMAL HIGH (ref 0–37)
Albumin: 3.7 g/dL (ref 3.5–5.2)
Bilirubin, Direct: 0.4 mg/dL — ABNORMAL HIGH (ref 0.0–0.3)
Total Protein: 6.4 g/dL (ref 6.0–8.3)

## 2010-05-03 LAB — URINE CULTURE
Colony Count: NO GROWTH
Culture  Setup Time: 201110241242

## 2010-05-03 LAB — BRAIN NATRIURETIC PEPTIDE: Pro B Natriuretic peptide (BNP): 350 pg/mL — ABNORMAL HIGH (ref 0.0–100.0)

## 2010-05-03 LAB — SODIUM, URINE, RANDOM: Sodium, Ur: <10 mEq/L

## 2010-05-03 LAB — TSH: TSH: 1.133 u[IU]/mL (ref 0.350–4.500)

## 2010-05-03 LAB — HEMOGLOBIN A1C: Mean Plasma Glucose: 263 mg/dL — ABNORMAL HIGH (ref <117)

## 2010-05-08 ENCOUNTER — Ambulatory Visit (INDEPENDENT_AMBULATORY_CARE_PROVIDER_SITE_OTHER): Payer: Medicare PPO | Admitting: Pharmacist

## 2010-05-08 DIAGNOSIS — I4891 Unspecified atrial fibrillation: Secondary | ICD-10-CM

## 2010-05-08 DIAGNOSIS — Z7901 Long term (current) use of anticoagulants: Secondary | ICD-10-CM

## 2010-05-08 DIAGNOSIS — I6350 Cerebral infarction due to unspecified occlusion or stenosis of unspecified cerebral artery: Secondary | ICD-10-CM

## 2010-05-08 LAB — POCT INR: INR: 1.8

## 2010-05-08 NOTE — Progress Notes (Signed)
Anti-Coagulation Progress Note  Brian [name] is a 55 y.o. male who is currently on an anti-coagulation regimen.    RECENT RESULTS: Recent results are below, the most recent result is correlated with a dose of 87.5 mg. per week: Lab Results  Component Value Date   INR 1.80 05/08/2010   INR 1.4 04/24/2010   INR 1.4* 04/03/2010    ANTI-COAG DOSE:   Latest dosing instructions   Total Sun Mon Tue Wed Thu Fri Sat   97.5 15 mg 12.5 mg 15 mg 12.5 mg 15 mg 12.5 mg 15 mg    (5 mg3) (5 mg2.5) (5 mg3) (5 mg2.5) (5 mg3) (5 mg2.5) (5 mg3)         ANTICOAG SUMMARY: Anticoagulation Episode Summary              Current INR goal 2.0-3.0 Next INR check 05/22/2010   INR from last check 1.80! (05/08/2010)     Weekly max dose (mg)  Target end date Indefinite   Indications ATRIAL FIBRILLATION WITH RAPID VENTRICULAR RESPONSE, CEREBROVASCULAR ACCIDENT, Long term current use of anticoagulant   INR check location [facility] Preferred lab    Send INR reminders to [facility]   Comments        Provider Role Specialty Phone number   Larey Dresser  Internal Medicine [phone]        ANTICOAG TODAY: Anticoagulation Summary as of 05/08/2010              INR goal 2.0-3.0     Selected INR 1.80! (05/08/2010) Next INR check 05/22/2010   Weekly max dose (mg)  Target end date Indefinite   Indications ATRIAL FIBRILLATION WITH RAPID VENTRICULAR RESPONSE, CEREBROVASCULAR ACCIDENT, Long term current use of anticoagulant    Anticoagulation Episode Summary              INR check location [facility] Preferred lab    Send INR reminders to [facility]   Comments        Provider Role Specialty Phone number   Larey Dresser  Internal Medicine [phone]        PATIENT INSTRUCTIONS: Patient Instructions  Patient instructed to take medications as defined in the Anti-coagulation Track section of this encounter.  Patient instructed to take today's dose.  Patient verbalized  understanding of these instructions.        FOLLOW-UP Return in 2 weeks (on 05/22/2010) for Follow up INR.  Jorene Guest, III Pharm.D., CACP

## 2010-05-08 NOTE — Patient Instructions (Signed)
Patient instructed to take medications as defined in the Anti-coagulation Track section of this encounter.  Patient instructed to take today's dose.  Patient verbalized understanding of these instructions.    

## 2010-05-12 ENCOUNTER — Other Ambulatory Visit: Payer: Self-pay | Admitting: Internal Medicine

## 2010-05-12 NOTE — Telephone Encounter (Signed)
Pt states he only has 2 pills left

## 2010-05-22 ENCOUNTER — Ambulatory Visit: Payer: Medicare PPO

## 2010-06-05 LAB — POCT I-STAT, CHEM 8
BUN: 14 mg/dL (ref 6–23)
Calcium, Ion: 1.13 mmol/L (ref 1.12–1.32)
Chloride: 102 mEq/L (ref 96–112)
Creatinine, Ser: 1 mg/dL (ref 0.4–1.5)
Glucose, Bld: 186 mg/dL — ABNORMAL HIGH (ref 70–99)
HCT: 46 % (ref 39.0–52.0)
Hemoglobin: 15.6 g/dL (ref 13.0–17.0)
Potassium: 3.4 mEq/L — ABNORMAL LOW (ref 3.5–5.1)
Sodium: 140 mEq/L (ref 135–145)
TCO2: 26 mmol/L (ref 0–100)

## 2010-09-20 ENCOUNTER — Emergency Department (HOSPITAL_COMMUNITY): Payer: Medicare PPO

## 2010-09-20 ENCOUNTER — Emergency Department (HOSPITAL_COMMUNITY)
Admission: EM | Admit: 2010-09-20 | Discharge: 2010-09-21 | Disposition: A | Discharge status: Home / Self Care | Payer: Medicare PPO | Attending: Emergency Medicine | Admitting: Emergency Medicine

## 2010-09-20 DIAGNOSIS — S92919A Unspecified fracture of unspecified toe(s), initial encounter for closed fracture: Secondary | ICD-10-CM | POA: Insufficient documentation

## 2010-09-20 DIAGNOSIS — IMO0002 Reserved for concepts with insufficient information to code with codable children: Secondary | ICD-10-CM | POA: Insufficient documentation

## 2010-09-20 DIAGNOSIS — M79609 Pain in unspecified limb: Secondary | ICD-10-CM | POA: Insufficient documentation

## 2010-09-20 DIAGNOSIS — M25569 Pain in unspecified knee: Secondary | ICD-10-CM | POA: Insufficient documentation

## 2010-09-20 DIAGNOSIS — R404 Transient alteration of awareness: Secondary | ICD-10-CM | POA: Insufficient documentation

## 2010-09-20 DIAGNOSIS — E119 Type 2 diabetes mellitus without complications: Secondary | ICD-10-CM | POA: Insufficient documentation

## 2010-09-20 DIAGNOSIS — I10 Essential (primary) hypertension: Secondary | ICD-10-CM | POA: Insufficient documentation

## 2010-09-20 LAB — PROTIME-INR
INR: 1 (ref 0.00–1.49)
Prothrombin Time: 13.4 seconds (ref 11.6–15.2)

## 2010-09-20 LAB — COMPREHENSIVE METABOLIC PANEL
ALT: 28 U/L (ref 0–53)
Albumin: 4.6 g/dL (ref 3.5–5.2)
Alkaline Phosphatase: 73 U/L (ref 39–117)
BUN: 36 mg/dL — ABNORMAL HIGH (ref 6–23)
Potassium: 4.2 mEq/L (ref 3.5–5.1)
Sodium: 134 mEq/L — ABNORMAL LOW (ref 135–145)
Total Protein: 8.2 g/dL (ref 6.0–8.3)

## 2010-09-20 LAB — LACTIC ACID, PLASMA: Lactic Acid, Venous: 2.3 mmol/L — ABNORMAL HIGH (ref 0.5–2.2)

## 2010-09-20 LAB — CBC
Hemoglobin: 13.2 g/dL (ref 13.0–17.0)
RBC: 4.45 MIL/uL (ref 4.22–5.81)
WBC: 17.7 10*3/uL — ABNORMAL HIGH (ref 4.0–10.5)

## 2010-09-20 MED ORDER — IOHEXOL 300 MG/ML  SOLN: 100.0000 mL | Freq: Once | INTRAMUSCULAR | Status: DC | PRN

## 2010-09-20 MED ORDER — IOHEXOL 300 MG/ML  SOLN
100.0000 mL | Freq: Once | INTRAMUSCULAR | Status: AC | PRN
  Administered 2010-09-20: 100 mL via INTRAVENOUS

## 2010-10-30 ENCOUNTER — Other Ambulatory Visit: Payer: Self-pay | Admitting: Cardiology

## 2010-11-27 ENCOUNTER — Other Ambulatory Visit: Payer: Self-pay | Admitting: Internal Medicine

## 2010-11-30 NOTE — Telephone Encounter (Signed)
Patient has not been seen in the clinic for a while. i would not refill his lisinopril at this time.

## 2011-01-16 NOTE — Telephone Encounter (Signed)
Request to pt's PCP.  Sander Nephew, RN 01/16/2011 11:59 AM.

## 2011-02-08 ENCOUNTER — Other Ambulatory Visit: Payer: Self-pay | Admitting: Cardiology

## 2011-02-09 NOTE — Telephone Encounter (Signed)
.   Requested Prescriptions   Pending Prescriptions Disp Refills  . amLODipine (NORVASC) 10 MG tablet [Pharmacy Med Name: AMLODIPINE 10MG      TAB] 30 tablet 2    Sig: TAKE ONE TABLET BY MOUTH EVERY DAY

## 2011-02-25 ENCOUNTER — Other Ambulatory Visit: Payer: Self-pay | Admitting: Cardiology

## 2011-06-04 ENCOUNTER — Other Ambulatory Visit: Payer: Self-pay | Admitting: Cardiology

## 2011-06-11 ENCOUNTER — Other Ambulatory Visit: Payer: Self-pay | Admitting: Family Medicine

## 2011-06-11 DIAGNOSIS — N19 Unspecified kidney failure: Secondary | ICD-10-CM

## 2011-06-13 ENCOUNTER — Inpatient Hospital Stay: Admission: RE | Admit: 2011-06-13 | Payer: Medicare PPO | Source: Ambulatory Visit

## 2011-06-18 ENCOUNTER — Ambulatory Visit
Admission: RE | Admit: 2011-06-18 | Discharge: 2011-06-18 | Disposition: A | Discharge status: Home / Self Care | Payer: Medicare Other | Source: Ambulatory Visit | Attending: Family Medicine | Admitting: Family Medicine

## 2011-06-18 DIAGNOSIS — N19 Unspecified kidney failure: Secondary | ICD-10-CM

## 2011-11-19 ENCOUNTER — Telehealth: Payer: Self-pay | Admitting: Cardiology

## 2011-11-19 NOTE — Telephone Encounter (Signed)
New Problem:    Patient called in retuning a call that came form this office about scheduling a test.  Please call back.

## 2011-11-19 NOTE — Telephone Encounter (Signed)
Follow-up:    Patient returned your call.  Please call back. 

## 2011-11-19 NOTE — Telephone Encounter (Signed)
Left message to call back  

## 2011-11-19 NOTE — Telephone Encounter (Signed)
Unsure of who called pt but he is overdue to be seen.  appt scheduled

## 2011-11-27 ENCOUNTER — Encounter: Payer: Self-pay | Admitting: Cardiology

## 2011-11-29 ENCOUNTER — Other Ambulatory Visit: Payer: Self-pay | Admitting: Cardiology

## 2011-11-29 DIAGNOSIS — I701 Atherosclerosis of renal artery: Secondary | ICD-10-CM

## 2011-12-03 ENCOUNTER — Encounter: Payer: Self-pay | Admitting: Cardiology

## 2011-12-03 ENCOUNTER — Encounter (INDEPENDENT_AMBULATORY_CARE_PROVIDER_SITE_OTHER): Payer: Medicare Other

## 2011-12-03 DIAGNOSIS — I701 Atherosclerosis of renal artery: Secondary | ICD-10-CM

## 2011-12-03 DIAGNOSIS — N189 Chronic kidney disease, unspecified: Secondary | ICD-10-CM

## 2011-12-04 ENCOUNTER — Ambulatory Visit (INDEPENDENT_AMBULATORY_CARE_PROVIDER_SITE_OTHER): Payer: Medicare Other | Admitting: Cardiology

## 2011-12-04 ENCOUNTER — Encounter: Payer: Self-pay | Admitting: Cardiology

## 2011-12-04 VITALS — BP 134/82 | HR 75 | Ht 70.0 in | Wt 216.8 lb

## 2011-12-04 DIAGNOSIS — I5020 Unspecified systolic (congestive) heart failure: Secondary | ICD-10-CM

## 2011-12-04 DIAGNOSIS — Z7901 Long term (current) use of anticoagulants: Secondary | ICD-10-CM

## 2011-12-04 DIAGNOSIS — I4891 Unspecified atrial fibrillation: Secondary | ICD-10-CM

## 2011-12-04 DIAGNOSIS — N179 Acute kidney failure, unspecified: Secondary | ICD-10-CM

## 2011-12-04 DIAGNOSIS — I10 Essential (primary) hypertension: Secondary | ICD-10-CM

## 2011-12-04 NOTE — Patient Instructions (Addendum)
The current medical regimen is effective;  continue present plan and medications.  Follow up in 18 months with Dr Percival Spanish.  You will receive a letter in the mail 2 months before you are due.  Please call us when you receive this letter to schedule your follow up appointment.

## 2011-12-04 NOTE — Progress Notes (Signed)
HPI The patient presents for followup of previous cardiomyopathy. In the past his ejection fraction was 25%. He had moderate mitral regurgitation.  However, his last ejection fraction last year was back to 60% with trivial mitral regurgitation. From a cardiac standpoint he has been doing well. His blood pressure seems to be well controlled.  He has had some trouble with renal insufficiency and briefly had his ACE inhibitor and diuretics stopped. He is following with nephrology who has restarted these and he apparently has stable renal function. He's going to start working out at [employer]. He's gained some weight and his diabetes has not been as easy to control. However, he denies any chest pressure, neck or arm discomfort. He has had no palpitations, presyncope or syncope. He denies any PND or orthopnea.  Allergies  Allergen Reactions  . Codeine     Current Outpatient Prescriptions  Medication Sig Dispense Refill  . amLODipine (NORVASC) 10 MG tablet TAKE ONE TABLET BY MOUTH EVERY DAY(PLEASE CONTACT DR OFFICE TO SCHEDULE APPOINTMENT FOR FUTURE REFILLS)  30 tablet  4  . aspirin 81 MG tablet Take 81 mg by mouth daily.        . carvedilol (COREG) 25 MG tablet TAKE ONE TABLET BY MOUTH TWICE DAILY  30 tablet  12  . hydrochlorothiazide (HYDRODIURIL) 25 MG tablet       . insulin NPH (HUMULIN N) 100 UNIT/ML injection Inject into the skin. Inject 7 units under the skin before your dinner and 7 units before supper.      . Insulin Syringe-Needle U-100 (INSULIN SYRINGE .3CC/31GX5/16") 31G X 5/16" 0.3 ML MISC Use once daily to inject insulin.       Marland Kitchen LEVEMIR FLEXPEN 100 UNIT/ML injection at bedtime. Take 35 mg and 70 mg at hs      . lisinopril (PRINIVIL,ZESTRIL) 40 MG tablet Take 40 mg by mouth daily. Take 20 mg daily      . ONE TOUCH ULTRA TEST test strip       . rosuvastatin (CRESTOR) 10 MG tablet Take 10 mg by mouth daily.      Marland Kitchen warfarin (COUMADIN) 5 MG tablet TAKE AS DIRECTED  90 tablet  2    Past  Medical History  Diagnosis Date  . Diabetes mellitus   . Hypertension   . Atrial fibrillation   . Cardiomyopathy   . Cerebrovascular accident     Past Surgical History  Procedure Date  . Ear surgery as a child     ROS: As stated in the HPI and negative for all other systems.  PHYSICAL EXAM BP 134/82  Pulse 75  Ht 5\' 10"  (1.778 m)  Wt 216 lb 12.8 oz (98.34 kg)  BMI 31.11 kg/m2 GENERAL:  Well appearing HEENT:  Pupils equal round and reactive, fundi not visualized, oral mucosa unremarkable , poor dentition NECK:  No jugular venous distention, waveform within normal limits, carotid upstroke brisk and symmetric, no bruits, no thyromegaly LYMPHATICS:  No cervical, inguinal adenopathy LUNGS:  Clear to auscultation bilaterally BACK:  No CVA tenderness CHEST:  Unremarkable HEART:  PMI not displaced or sustained,S1 and S2 within normal limits, no S3, no S4, no clicks, no rubs, no murmurs ABD:  Flat, positive bowel sounds normal in frequency in pitch, no bruits, no rebound, no guarding, no midline pulsatile mass, no hepatomegaly, no splenomegaly, mildly obese EXT:  2 plus pulses throughout, no edema, no cyanosis no clubbing SKIN:  No rashes no nodules NEURO:  Cranial nerves II through  XII grossly intact, motor grossly intact throughout PSYCH:  Cognitively intact, oriented to person place and time   EKG:  Sinus rhythm, rate 75, axis within normal limits, intervals within normal limits, no acute ST-T wave changes. Nonspecific inferior ST changes. 12/04/2011   ASSESSMENT AND PLAN  CONGESTIVE HEART FAILURE, SYSTOLIC DYSFUNCTION  His ejection fraction last year is back to 60%. There was trivial mitral regurgitation. He can continue the therapies as listed. No further workup is suggested.  HTN His blood pressure is controlled.  He will continue the medications as listed.  ATRIAL FIBRILLATION WITH RAPID VENTRICULAR RESPONSE He seems to be maintaining sinus rhythm. However, with his  past CVA and other risk factors he is at very high risk for thromboembolic events. He will continue meds as listed including Coumadin. We talked about alternative therapies but he has looked into some of these and they have been too expensive.  OVERWEIGHT - The patient understands the need to lose weight with diet and exercise. We have discussed specific strategies for this.  CKD - Her creatinine is now being followed by nephrology. I appreciate the fact that they are maintaining his ACE inhibitor. Certainly if his renal function deteriorates this may need to be discontinued. I will defer to their management.

## 2012-05-23 ENCOUNTER — Encounter: Payer: Self-pay | Admitting: Family Medicine

## 2012-05-23 ENCOUNTER — Ambulatory Visit (INDEPENDENT_AMBULATORY_CARE_PROVIDER_SITE_OTHER): Payer: Medicare Other | Admitting: Family Medicine

## 2012-05-23 VITALS — BP 140/90 | HR 74 | Temp 98.1°F | Resp 16 | Wt 211.0 lb

## 2012-05-23 DIAGNOSIS — IMO0001 Reserved for inherently not codable concepts without codable children: Secondary | ICD-10-CM

## 2012-05-23 DIAGNOSIS — I4891 Unspecified atrial fibrillation: Secondary | ICD-10-CM

## 2012-05-23 LAB — PT WITH INR/FINGERSTICK: PT, fingerstick: 15.9 seconds — ABNORMAL HIGH (ref 10.4–12.5)

## 2012-05-23 MED ORDER — DABIGATRAN ETEXILATE MESYLATE 150 MG PO CAPS: 150.0000 mg | ORAL_CAPSULE | Freq: Two times a day (BID) | ORAL | Status: DC

## 2012-05-23 NOTE — Patient Instructions (Addendum)
Go get pradaxa at [facility].  If affordable, stop warfarin.  If too expensive, increase warfarin to 15 mg on Monday, Wed, Friday, and 10 mg on all other days.  Increase novolog to 15 units with meals and recheck sugars in 1 week.

## 2012-05-23 NOTE — Progress Notes (Signed)
Subjective:     Patient ID: Brian [name], male   DOB: 07-May-1955, 57 y.o.   MRN: [id number]  HPI Patient is here today for followup of his atrial fibrillation. He is adamant that he is taking his Coumadin 10 mg every day except Mondays and Fridays. On Mondays and Fridays he takes 5 mg. His INR today in the clinic is 1.3.  He denies missing any Coumadin doses. His INR has been highly variable in the past. His compliance with medication has also been questionable. He cannot afford xarelto.  With regards to his insulin-dependent diabetes mellitus, he is on Levemir 70 units in the morning 40 units in the evening. He is also taking NovoLog 10 units with lunch and dinner. His fasting blood sugars are 70 to 120s. However his two-hour postprandial sugars are 200 to 230. Past Medical History  Diagnosis Date  . Diabetes mellitus   . Hypertension   . Atrial fibrillation   . Cardiomyopathy   . Cerebrovascular accident    Current Outpatient Prescriptions on File Prior to Visit  Medication Sig Dispense Refill  . amLODipine (NORVASC) 10 MG tablet TAKE ONE TABLET BY MOUTH EVERY DAY(PLEASE CONTACT DR OFFICE TO SCHEDULE APPOINTMENT FOR FUTURE REFILLS)  30 tablet  4  . aspirin 81 MG tablet Take 81 mg by mouth daily.        . carvedilol (COREG) 25 MG tablet TAKE ONE TABLET BY MOUTH TWICE DAILY  30 tablet  12  . insulin NPH (HUMULIN N) 100 UNIT/ML injection Inject into the skin. Inject 10 units under the skin before your dinner and 10 units before supper.      . Insulin Syringe-Needle U-100 (INSULIN SYRINGE .3CC/31GX5/16") 31G X 5/16" 0.3 ML MISC Use once daily to inject insulin.       Marland Kitchen LEVEMIR FLEXPEN 100 UNIT/ML injection at bedtime. Take 45 mg and 70 mg at hs      . lisinopril (PRINIVIL,ZESTRIL) 40 MG tablet Take 40 mg by mouth daily. Take 20 mg daily      . ONE TOUCH ULTRA TEST test strip       . warfarin (COUMADIN) 5 MG tablet TAKE AS DIRECTED  90 tablet  2   No current facility-administered  medications on file prior to visit.     Review of Systems    review of systems is otherwise negative Objective:   Physical Exam  Constitutional: He appears well-developed and well-nourished.  Cardiovascular: Normal rate, normal heart sounds and normal pulses.  An irregularly irregular rhythm present.  Pulmonary/Chest: Effort normal and breath sounds normal. No respiratory distress. He has no wheezes. He has no rales. He exhibits no tenderness.       Assessment:     Atrial fibrillation Insulin-dependent diabetes mellitus uncontrolled    Plan:     We will try switching the patient to Pradaxa 150 mg by mouth twice a day.  Because of this he is to discontinue warfarin. If he cannot afford this we are going to increase his warfarin to 10 mg every day except for Monday Wednesdays and Fridays and on those days he is to take 15 mg a day. If he continues warfarin, check an INR here next week.  Increase NovoLog 15 units with meals and recheck postprandial sugars in one week.

## 2012-05-30 ENCOUNTER — Ambulatory Visit (INDEPENDENT_AMBULATORY_CARE_PROVIDER_SITE_OTHER): Payer: Medicare Other | Admitting: Family Medicine

## 2012-05-30 ENCOUNTER — Ambulatory Visit: Payer: Medicare Other | Admitting: Family Medicine

## 2012-06-23 ENCOUNTER — Ambulatory Visit (INDEPENDENT_AMBULATORY_CARE_PROVIDER_SITE_OTHER): Payer: Medicare Other | Admitting: Family Medicine

## 2012-06-23 ENCOUNTER — Encounter: Payer: Self-pay | Admitting: Family Medicine

## 2012-06-23 VITALS — BP 140/86 | HR 83 | Temp 98.1°F | Resp 18 | Wt 210.0 lb

## 2012-06-23 DIAGNOSIS — I10 Essential (primary) hypertension: Secondary | ICD-10-CM

## 2012-06-23 DIAGNOSIS — I4891 Unspecified atrial fibrillation: Secondary | ICD-10-CM

## 2012-06-23 DIAGNOSIS — Z1211 Encounter for screening for malignant neoplasm of colon: Secondary | ICD-10-CM

## 2012-06-23 DIAGNOSIS — IMO0001 Reserved for inherently not codable concepts without codable children: Secondary | ICD-10-CM

## 2012-06-23 DIAGNOSIS — E785 Hyperlipidemia, unspecified: Secondary | ICD-10-CM

## 2012-06-23 LAB — HEPATIC FUNCTION PANEL
Alkaline Phosphatase: 94 U/L (ref 39–117)
Indirect Bilirubin: 0.3 mg/dL (ref 0.0–0.9)
Total Bilirubin: 0.4 mg/dL (ref 0.3–1.2)
Total Protein: 7.2 g/dL (ref 6.0–8.3)

## 2012-06-23 LAB — BASIC METABOLIC PANEL
BUN: 33 mg/dL — ABNORMAL HIGH (ref 6–23)
Chloride: 100 mEq/L (ref 96–112)
Potassium: 5 mEq/L (ref 3.5–5.3)

## 2012-06-23 LAB — HEMOGLOBIN A1C: Mean Plasma Glucose: 209 mg/dL — ABNORMAL HIGH (ref <117)

## 2012-06-23 NOTE — Progress Notes (Signed)
Subjective:    Patient ID: Brian [name], male    DOB: 1955-11-13, 57 y.o.   MRN: [id number]  HPI Patient is here for followup of his atrial fibrillation. We were finally able to successfully switch him to Pradaxa 150 mg by mouth twice a day. He is no longer taking Coumadin. However he does have a history of chronic kidney disease. I will check a BMP today because we may need to decrease the doses back to the 75 mg by mouth twice a day.  Insulin-dependent diabetes mellitus-he is currently taking Levemir 40 units in the morning 70 units at night. He is taking Humulin 15 units with lunch and 15 units with dinner. He reports fasting blood sugars in the 90s. He reports two-hour postprandial sugars of 110-120. He is due for an A1c..  Currently he is taking amlodipine 10 mg by mouth daily Coreg 25 mg by mouth twice a day and lisinopril 20 mg by mouth daily for hypertension. His blood pressures borderline. However the nephrologist would like his blood pressures higher due to her concern that low blood pressures could be adding to his worsening creatinine.    Patient has adversely quit taking Crestor. He is also due not fasting today therefore I cannot check a fasting lipid panel. He has a history of stroke although this is mainly due to his atrial fibrillation.  Past Medical History  Diagnosis Date  . Diabetes mellitus   . Hypertension   . Atrial fibrillation   . Cardiomyopathy   . Cerebrovascular accident   .med Allergies  Allergen Reactions  . Codeine    History   Social History  . Marital Status: Divorced    Spouse Name: N/A    Number of Children: N/A  . Years of Education: N/A   Occupational History  . Not on file.   Social History Main Topics  . Smoking status: Never Smoker   . Smokeless tobacco: Current User    Types: Chew  . Alcohol Use:   . Drug Use:   . Sexually Active:    Other Topics Concern  . Not on file   Social History Narrative   He has children.         Review of Systems    review of systems is otherwise negative Objective:   Physical Exam  Constitutional: He appears well-developed and well-nourished.  Eyes: Conjunctivae are normal. Pupils are equal, round, and reactive to light.  Neck: No JVD present. No thyromegaly present.  Cardiovascular: Normal rate, regular rhythm, normal heart sounds and intact distal pulses.  Exam reveals no gallop.   No murmur heard. Pulmonary/Chest: Effort normal and breath sounds normal. No respiratory distress. He has no wheezes. He has no rales. He exhibits no tenderness.  Abdominal: Soft. Bowel sounds are normal. He exhibits no distension. There is no tenderness. There is no rebound and no guarding.  Lymphadenopathy:    He has no cervical adenopathy.  Skin: Skin is warm and dry.   patient has 4/4 sensation to 10 mg monofilament bilaterally He has significant onychomycosis.         Assessment & Plan:  1. A-fib Check renal function, and his creatinine is significantly elevated we will need to decrease the dose of Pradaxa to 75 mg twice a day  2. DIABETES MELLITUS, TYPE II, UNCONTROLLED A1c is less than 7. Check hemoglobin [id number] - Basic Metabolic Panel - Hemoglobin A1c - Hepatic Function Panel  3. HYPERLIPIDEMIA 5 the patient return fasting for a  fasting lipid panel. Goal LDL is less than 100.  4. Essential hypertension, benign Blood pressure is borderline, I advised the patient begin regular aerobic exercise to try to address his hypertension - Basic Metabolic Panel  5. Screen for colon cancer He is due for colon cancer screening. - Ambulatory referral to Gastroenterology

## 2012-06-24 ENCOUNTER — Other Ambulatory Visit: Payer: Self-pay | Admitting: Family Medicine

## 2012-06-24 DIAGNOSIS — IMO0001 Reserved for inherently not codable concepts without codable children: Secondary | ICD-10-CM

## 2012-06-24 MED ORDER — DABIGATRAN ETEXILATE MESYLATE 75 MG PO CAPS: 75.0000 mg | ORAL_CAPSULE | Freq: Two times a day (BID) | ORAL | Status: DC

## 2012-06-25 ENCOUNTER — Other Ambulatory Visit: Payer: Self-pay | Admitting: Family Medicine

## 2012-06-25 NOTE — Telephone Encounter (Signed)
Pt already has pradaxa 75 mg ordered

## 2012-07-29 ENCOUNTER — Telehealth: Payer: Self-pay | Admitting: Family Medicine

## 2012-07-29 NOTE — Telephone Encounter (Signed)
Per Dr. Dennard Schaumann can give samples of Lantus and will have to go back on coumadin starting at old dose and checking an INR/PT 1 week after going back on coumadin. Pt states that he has 1/2 bottle of the Pradaxa and will call when he runs out and will come by tomorrow to pick up samples of Lantus.

## 2012-08-25 ENCOUNTER — Encounter: Payer: Self-pay | Admitting: Family Medicine

## 2012-08-26 NOTE — Telephone Encounter (Signed)
This encounter was created in error - please disregard.

## 2012-08-28 ENCOUNTER — Ambulatory Visit: Payer: Medicare Other | Admitting: *Deleted

## 2012-08-28 ENCOUNTER — Other Ambulatory Visit: Payer: Self-pay

## 2012-09-11 ENCOUNTER — Ambulatory Visit: Payer: Medicare Other | Admitting: *Deleted

## 2012-10-07 ENCOUNTER — Encounter: Payer: Self-pay | Admitting: *Deleted

## 2012-10-07 ENCOUNTER — Encounter: Payer: Medicare Other | Attending: Family Medicine | Admitting: *Deleted

## 2012-10-07 VITALS — Ht 70.0 in | Wt 208.6 lb

## 2012-10-07 DIAGNOSIS — Z713 Dietary counseling and surveillance: Secondary | ICD-10-CM | POA: Insufficient documentation

## 2012-10-07 DIAGNOSIS — IMO0001 Reserved for inherently not codable concepts without codable children: Secondary | ICD-10-CM

## 2012-10-07 DIAGNOSIS — E119 Type 2 diabetes mellitus without complications: Secondary | ICD-10-CM | POA: Insufficient documentation

## 2012-10-07 NOTE — Progress Notes (Signed)
Appt start time: 1630 end time:  1730.   Assessment:  Patient was seen on  10/07/2012 for individual diabetes education. History of diabetes for about 5 years. SMBG several times a week, with reported range of 88-250 mg/dl in AM. States he occasionally forgets to take Levemir if he falls asleep at night. Is concerned about cost of insulin going from $45 for 2 vials to now $330 which he cannot afford. Lives alone, so he shops for groceries, eats out usually for breakfast, home for lunch and dinner.  Current HbA1c: 8.9% in 06/2012  MEDICATIONS: see list, diabtes medication includes Levemir BID and Humulin insulin before lunch and dinner   DIETARY INTAKE:  Usual eating pattern includes 3 meals and 0-1 snacks per day.  Everyday foods include good variety of all food groups.  Avoided foods include mayo, sweet tea.    24-hr recall:  B ( AM): 3 eggs (2 are egg whites), hash browns, sausage, plain whole wheat toast and unsweet tea.  Snk ( AM): none  L ( PM): sandwich with cheese and meat, mustard, occasionally chips, unsweet tea or diet soda Snk ( PM): none unless a cookie or hard candy once in a while D ( PM): meat, starch, vegetables, salad, [place] dressing, unsweet tea Snk ( PM): none Beverages: unsweet tea  Usual physical activity: was walking but is getting ready to join [employer] so he can do water aerobics  Estimated energy needs: 1600 calories 180 g carbohydrates 120 g protein 44 g fat  Progress Towards Goal(s):  In progress.   Nutritional Diagnosis:  NB-1.1 Food and nutrition-related knowledge deficit As related to diabetes.  As evidenced by A1c of 8.9%.    Intervention:  Nutrition counseling provided.  Discussed diabetes disease process and treatment options.  Discussed physiology of diabetes and role of obesity on insulin resistance.  Encouraged moderate weight reduction to improve glucose levels.  Discussed role of medications and diet in glucose control  Provided education on  macronutrients on glucose levels.  Provided education on carb counting, importance of regularly scheduled meals/snacks, and meal planning  Discussed effects of physical activity on glucose levels and long-term glucose control.  Recommended 150 minutes of physical activity/week.  Reviewed patient medications.  Discussed role of medication on blood glucose and possible side effects  Discussed blood glucose monitoring and interpretation.  Discussed recommended target ranges and individual ranges.    Described short-term complications: hyper- and hypo-glycemia.  Discussed causes,symptoms, and treatment options.  At next visit plan to continue education with:   Discussed prevention, detection, and treatment of long-term complications.  Discussed the role of prolonged elevated glucose levels on body systems.  Discussed role of stress on blood glucose levels and discussed strategies to manage psychosocial issues.  Discussed recommendations for long-term diabetes self-care.  Established checklist for medical, dental, and emotional self-care.  Handouts given during visit include: Living Well with Diabetes Carb Counting and Food Label handouts Meal Plan Card  Insulin Action handout  Barriers to learning/adherance to lifestyle change: financial limitations and ability to pay for insulin  Diabetes self-care support plan:   [facility] support group  Continued diabetes education  Monitoring/Evaluation:  Dietary intake, exercise, SMBG, and body weight in 4 week(s).

## 2012-10-21 ENCOUNTER — Encounter: Payer: Self-pay | Admitting: Family Medicine

## 2012-10-21 NOTE — Telephone Encounter (Signed)
This encounter was created in error - please disregard.

## 2012-10-27 ENCOUNTER — Telehealth: Payer: Self-pay | Admitting: Family Medicine

## 2012-10-27 NOTE — Telephone Encounter (Signed)
Pt said that he is out of insulin and wants to talk to you. I asked him if he needed a sample and he said he wants to talk to you.

## 2012-10-28 NOTE — Telephone Encounter (Signed)
Pt was in need of Levemir samples.. Put one up front for him and he is aware

## 2012-10-30 ENCOUNTER — Telehealth: Payer: Self-pay | Admitting: Family Medicine

## 2012-10-30 ENCOUNTER — Ambulatory Visit (INDEPENDENT_AMBULATORY_CARE_PROVIDER_SITE_OTHER): Payer: Medicare Other | Admitting: Family Medicine

## 2012-10-30 ENCOUNTER — Encounter: Payer: Self-pay | Admitting: Family Medicine

## 2012-10-30 VITALS — BP 120/82 | HR 80 | Temp 98.3°F | Resp 18 | Wt 210.0 lb

## 2012-10-30 DIAGNOSIS — IMO0001 Reserved for inherently not codable concepts without codable children: Secondary | ICD-10-CM

## 2012-10-30 DIAGNOSIS — E785 Hyperlipidemia, unspecified: Secondary | ICD-10-CM | POA: Insufficient documentation

## 2012-10-30 LAB — COMPLETE METABOLIC PANEL WITH GFR
BUN: 44 mg/dL — ABNORMAL HIGH (ref 6–23)
CO2: 21 mEq/L (ref 19–32)
Calcium: 9.5 mg/dL (ref 8.4–10.5)
Chloride: 103 mEq/L (ref 96–112)
Creat: 2.5 mg/dL — ABNORMAL HIGH (ref 0.50–1.35)
GFR, Est African American: 31 mL/min — ABNORMAL LOW
GFR, Est Non African American: 27 mL/min — ABNORMAL LOW
Glucose, Bld: 323 mg/dL — ABNORMAL HIGH (ref 70–99)

## 2012-10-30 LAB — LIPID PANEL
Cholesterol: 231 mg/dL — ABNORMAL HIGH (ref 0–200)
Triglycerides: 526 mg/dL — ABNORMAL HIGH (ref <150)

## 2012-10-30 NOTE — Telephone Encounter (Signed)
LMTRC

## 2012-10-30 NOTE — Progress Notes (Signed)
Subjective:    Patient ID: Brian [name], male    DOB: 11-21-1955, 57 y.o.   MRN: [id number]  HPI Patient is a very pleasant 57 year old white male who is here today for followup. He has atrial fibrillation with a history of RPR as well a cerebrovascular accident.  He is currently on Pradaxa for stroke prevention. Certainly he is now in the doughnut hole and will no longer be able to afford the med.  He plans on resuming his previous dose of Coumadin after this month on Pradaxa.  He is a history of chronic kidney disease stage IV. He also has a history of diabetes mellitus type 2 which is poorly controlled. He has not been taking his Levemir for the last 2 weeks due to running out of the medication and not being able to afford it. He states at the beginning of next month he would be able to refer her Levemir. We have given him 3 weeks of samples to help him make it to that point. Unfortunately his sugars have been running high and she has not been taking his insulin. He was supposed to be on Humulin R 15 units before supper. He apparently is on NPH 15 units before supper. We will need to adjust this. Past Medical History  Diagnosis Date  . Diabetes mellitus   . Hypertension   . Atrial fibrillation   . Cardiomyopathy   . Cerebrovascular accident   . Hyperlipidemia    Current Outpatient Prescriptions on File Prior to Visit  Medication Sig Dispense Refill  . amLODipine (NORVASC) 10 MG tablet TAKE ONE TABLET BY MOUTH EVERY DAY(PLEASE CONTACT DR OFFICE TO SCHEDULE APPOINTMENT FOR FUTURE REFILLS)  30 tablet  4  . aspirin 81 MG tablet Take 81 mg by mouth daily.        . carvedilol (COREG) 25 MG tablet TAKE ONE TABLET BY MOUTH TWICE DAILY  30 tablet  12  . dabigatran (PRADAXA) 75 MG CAPS Take 1 capsule (75 mg total) by mouth every 12 (twelve) hours.  60 capsule  11  . insulin NPH (HUMULIN N) 100 UNIT/ML injection Inject 15 Units into the skin. Inject 15 units under the skin before your dinner and 15   units before supper.      . Insulin Syringe-Needle U-100 (INSULIN SYRINGE .3CC/31GX5/16") 31G X 5/16" 0.3 ML MISC Use once daily to inject insulin.       . Insulin Syringes, Disposable, U-100 1 ML MISC by Does not apply route. [employer] insulin syringes  Capacity - 3/69ml - gauge - 30 - 41mm      . LEVEMIR FLEXPEN 100 UNIT/ML injection at bedtime. Take 70 units qam and 40 units at hs      . lisinopril (PRINIVIL,ZESTRIL) 40 MG tablet Take 20 mg by mouth daily. Take 20 mg daily      . ONE TOUCH ULTRA TEST test strip        No current facility-administered medications on file prior to visit.   Allergies  Allergen Reactions  . Codeine    History   Social History  . Marital Status: Divorced    Spouse Name: N/A    Number of Children: N/A  . Years of Education: N/A   Occupational History  . Not on file.   Social History Main Topics  . Smoking status: Never Smoker   . Smokeless tobacco: Never Used  . Alcohol Use: No  . Drug Use: Not on file  . Sexual Activity: Not on  file   Other Topics Concern  . Not on file   Social History Narrative   He has children.   Family History  Problem Relation Age of Onset  . Hyperlipidemia Father   . Hypertension Father   . Coronary artery disease Neg Hx     no early CAD.       Review of Systems  All other systems reviewed and are negative.       Objective:   Physical Exam  Vitals reviewed. Constitutional: He is oriented to person, place, and time. He appears well-developed and well-nourished.  Neck: Neck supple. No JVD present. No thyromegaly present.  Cardiovascular: Normal rate, normal heart sounds and intact distal pulses.  Exam reveals no gallop and no friction rub.   No murmur heard. Pulmonary/Chest: Effort normal and breath sounds normal. No respiratory distress. He has no wheezes. He has no rales.  Abdominal: Soft. Bowel sounds are normal. He exhibits no distension. There is no tenderness. There is no rebound and no guarding.   Musculoskeletal: Normal range of motion. He exhibits no edema.  Lymphadenopathy:    He has no cervical adenopathy.  Neurological: He is alert and oriented to person, place, and time. No cranial nerve deficit. He exhibits normal muscle tone. Coordination normal.  Skin: No rash noted.          Assessment & Plan:  1. Type II or unspecified type diabetes mellitus without mention of complication, uncontrolled Check hemoglobin A1c today. Change NPH to Humulin R 15 units before dinner. Continue Levemir as long as financially he can afford the medication.  If not we will switch him to NPH twice a day.  Blood pressure is well controlled. I instructed the patient to resume Coumadin when he runs out of his current medication. Once he is on his previous dose of Coumadin for 2 weeks he is to come back in for an INR check. - COMPLETE METABOLIC PANEL WITH GFR - Lipid panel - Hemoglobin A1c

## 2012-10-30 NOTE — Telephone Encounter (Signed)
Message copied by Alyson Locket on Thu Oct 30, 2012  2:23 PM ------      Message from: Jenna Luo      Created: Thu Oct 30, 2012  9:36 AM       Patient is on Humulin N 15 units before supper (intermediate acting).  This is wrong.  We need to switch him to Humulin R 15 units before supper.  (short acting) ------

## 2012-11-03 MED ORDER — INSULIN REGULAR HUMAN 100 UNIT/ML IJ SOLN: 15.0000 [IU] | Freq: Three times a day (TID) | INTRAMUSCULAR | Status: DC

## 2012-11-03 NOTE — Telephone Encounter (Signed)
Med sent to pharm. And Patient aware of the change

## 2012-11-11 ENCOUNTER — Telehealth: Payer: Self-pay | Admitting: Family Medicine

## 2012-11-11 ENCOUNTER — Ambulatory Visit: Payer: Medicare Other | Admitting: *Deleted

## 2012-11-11 NOTE — Telephone Encounter (Signed)
Pt has questions about his insulin. He would not tell me the question. He only wants to speak to you.

## 2012-11-12 NOTE — Telephone Encounter (Signed)
LMTRC

## 2012-11-13 NOTE — Telephone Encounter (Signed)
Pts questions answered.  

## 2012-11-21 ENCOUNTER — Ambulatory Visit (INDEPENDENT_AMBULATORY_CARE_PROVIDER_SITE_OTHER): Payer: Medicare Other | Admitting: Family Medicine

## 2012-11-21 ENCOUNTER — Encounter: Payer: Self-pay | Admitting: Family Medicine

## 2012-11-21 VITALS — BP 130/96 | HR 80 | Temp 98.4°F | Resp 20 | Ht 69.0 in | Wt 208.0 lb

## 2012-11-21 DIAGNOSIS — IMO0001 Reserved for inherently not codable concepts without codable children: Secondary | ICD-10-CM

## 2012-11-21 DIAGNOSIS — I4891 Unspecified atrial fibrillation: Secondary | ICD-10-CM

## 2012-11-21 LAB — PT WITH INR/FINGERSTICK
INR, fingerstick: 1.1 (ref 0.80–1.20)
PT, fingerstick: 12.8 seconds — ABNORMAL HIGH (ref 10.4–12.5)

## 2012-11-21 NOTE — Progress Notes (Signed)
Subjective:    Patient ID: Brian [name], male    DOB: [birth year], 57 y.o.   MRN: [id number]  HPI 10/30/12 Patient is a very pleasant 57 year old white male who is here today for followup. He has atrial fibrillation with a history of RPR as well a cerebrovascular accident.  He is currently on Pradaxa for stroke prevention. Certainly he is now in the doughnut hole and will no longer be able to afford the med.  He plans on resuming his previous dose of Coumadin after this month on Pradaxa.  He is a history of chronic kidney disease stage IV. He also has a history of diabetes mellitus type 2 which is poorly controlled. He has not been taking his Levemir for the last 2 weeks due to running out of the medication and not being able to afford it. He states at the beginning of next month he would be able to refer her Levemir. We have given him 3 weeks of samples to help him make it to that point. Unfortunately his sugars have been running high and she has not been taking his insulin. He was supposed to be on Humulin R 15 units before supper. He apparently is on NPH 15 units before supper. We will need to adjust this.  At that time, my plan was: 1. Type II or unspecified type diabetes mellitus without mention of complication, uncontrolled Check hemoglobin A1c today. Change NPH to Humulin R 15 units before dinner. Continue Levemir as long as financially he can afford the medication.  If not we will switch him to NPH twice a day.  Blood pressure is well controlled. I instructed the patient to resume Coumadin when he runs out of his current medication. Once he is on his previous dose of Coumadin for 2 weeks he is to come back in for an INR check. - COMPLETE METABOLIC PANEL WITH GFR - Lipid panel - Hemoglobin A1c  Office Visit on 10/30/2012  Component Date Value Range Status  . Sodium 10/30/2012 134* 135 - 145 mEq/L Final  . Potassium 10/30/2012 4.7  3.5 - 5.3 mEq/L Final  . Chloride 10/30/2012 103  96 - 112  mEq/L Final  . CO2 10/30/2012 21  19 - 32 mEq/L Final  . Glucose, Bld 10/30/2012 323* 70 - 99 mg/dL Final  . BUN 10/30/2012 44* 6 - 23 mg/dL Final  . Creat 10/30/2012 2.50* 0.50 - 1.35 mg/dL Final  . Total Bilirubin 10/30/2012 0.4  0.3 - 1.2 mg/dL Final  . Alkaline Phosphatase 10/30/2012 83  39 - 117 U/L Final  . AST 10/30/2012 18  0 - 37 U/L Final  . ALT 10/30/2012 42  0 - 53 U/L Final  . Total Protein 10/30/2012 6.9  6.0 - 8.3 g/dL Final  . Albumin 10/30/2012 4.2  3.5 - 5.2 g/dL Final  . Calcium 10/30/2012 9.5  8.4 - 10.5 mg/dL Final  . GFR, Est African American 10/30/2012 31*  Final  . GFR, Est Non African American 10/30/2012 27*  Final   Comment:                            The estimated GFR is a calculation valid for adults (>=66 years old)                          that uses the CKD-EPI algorithm to adjust for age and sex. It is  not to be used for children, pregnant women, hospitalized patients,                             patients on dialysis, or with rapidly changing kidney function.                          According to the NKDEP, eGFR >89 is normal, 60-89 shows mild                          impairment, 30-59 shows moderate impairment, 15-29 shows severe                          impairment and <15 is ESRD.                             Marland Kitchen Cholesterol 10/30/2012 231* 0 - 200 mg/dL Final   Comment: ATP III Classification:                                < 200        mg/dL        Desirable                               200 - 239     mg/dL        Borderline High                               >= 240        mg/dL        High                             . Triglycerides 10/30/2012 526* <150 mg/dL Final  . HDL 10/30/2012 32* >39 mg/dL Final  . Total CHOL/HDL Ratio 10/30/2012 7.2   Final  . VLDL 10/30/2012 NOT CALC  0 - 40 mg/dL Final   Comment:                            Not calculated due to Triglyceride >400.                          Suggest ordering Direct  LDL (Unit Code: [phone]).  . LDL Cholesterol 10/30/2012   0 - 99 mg/dL Final   Comment:                            Not calculated due to Triglyceride >400.                          Suggest ordering Direct LDL (Unit Code: [phone]).                                                     Total  Cholesterol/HDL Ratio:CHD Risk                                                 Coronary Heart Disease Risk Table                                                                 Men       Women                                   1/2 Average Risk              3.4        3.3                                       Average Risk              5.0        4.4                                    2X Average Risk              9.6        7.1                                    3X Average Risk             23.4       11.0                          Use the calculated Patient Ratio above and the CHD Risk table                           to determine the patient's CHD Risk.                          ATP III Classification (LDL):                                < 100        mg/dL         Optimal                               100 - 129     mg/dL         Near or Above Optimal                               130 - 159  mg/dL         Borderline High                               160 - 189     mg/dL         High                                > 190        mg/dL         Very High                             . Hemoglobin A1C 10/30/2012 11.2* <5.7 % Final   Comment:                                                                                                 According to the ADA Clinical Practice Recommendations for 2011, when                          HbA1c is used as a screening test:                                                       >=6.5%   Diagnostic of Diabetes Mellitus                                     (if abnormal result is confirmed)                                                     5.7-6.4%   Increased risk of developing  Diabetes Mellitus                                                     References:Diagnosis and Classification of Diabetes Mellitus,Diabetes                          [id number] 1):S62-S69 and Standards of Medical Care in                                  Diabetes - 2011,Diabetes [id number] (Suppl 1):S11-S61.                             Marland Kitchen  Mean Plasma Glucose 10/30/2012 275* <117 mg/dL Final   Since the last office visit, we stopped pradaxa due to cost and renal insufficiency.  He is currently on coumadin.  He has been on Coumadin for over a week. He is currently taking 10 mg on Monday Wednesdays and Fridays and 5 mg every other day. His INR today in clinic is 1.1 with a PT of 12.8. He states he may have missed one or 2 doses. He is also currently taking Levemir 70 units at night and 40 units in the morning. He is taking regular insulin 20 units before lunch and dinner. His fasting blood sugars are 100-120. His two-hour postprandial sugars are 195-240.  He denies any hypoglycemia   Past Medical History  Diagnosis Date  . Diabetes mellitus   . Hypertension   . Atrial fibrillation   . Cardiomyopathy   . Cerebrovascular accident   . Hyperlipidemia    Current Outpatient Prescriptions on File Prior to Visit  Medication Sig Dispense Refill  . amLODipine (NORVASC) 10 MG tablet TAKE ONE TABLET BY MOUTH EVERY DAY(PLEASE CONTACT DR OFFICE TO SCHEDULE APPOINTMENT FOR FUTURE REFILLS)  30 tablet  4  . aspirin 81 MG tablet Take 81 mg by mouth daily.        . carvedilol (COREG) 25 MG tablet TAKE ONE TABLET BY MOUTH TWICE DAILY  30 tablet  12  . dabigatran (PRADAXA) 75 MG CAPS Take 1 capsule (75 mg total) by mouth every 12 (twelve) hours.  60 capsule  11  . insulin regular (HUMULIN R) 100 units/mL injection Inject 0.15 mLs (15 Units total) into the skin 3 (three) times daily before meals.  10 mL  5  . Insulin Syringe-Needle U-100 (INSULIN SYRINGE .3CC/31GX5/16") 31G X 5/16" 0.3 ML MISC Use once  daily to inject insulin.       . Insulin Syringes, Disposable, U-100 1 ML MISC by Does not apply route. ReliOn insulin syringes  Capacity - 3/34ml - gauge - 30 - 78mm      . LEVEMIR FLEXPEN 100 UNIT/ML injection at bedtime. Take 70 units qam and 40 units at hs      . lisinopril (PRINIVIL,ZESTRIL) 40 MG tablet Take 20 mg by mouth daily. Take 20 mg daily      . ONE TOUCH ULTRA TEST test strip        No current facility-administered medications on file prior to visit.   Allergies  Allergen Reactions  . Codeine    History   Social History  . Marital Status: Divorced    Spouse Name: N/A    Number of Children: N/A  . Years of Education: N/A   Occupational History  . Not on file.   Social History Main Topics  . Smoking status: Never Smoker   . Smokeless tobacco: Never Used  . Alcohol Use: No  . Drug Use: Not on file  . Sexual Activity: Not on file   Other Topics Concern  . Not on file   Social History Narrative   He has children.   Family History  Problem Relation Age of Onset  . Hyperlipidemia Father   . Hypertension Father   . Coronary artery disease Neg Hx     no early CAD.       Review of Systems  All other systems reviewed and are negative.       Objective:   Physical Exam  Vitals reviewed. Constitutional: He is oriented to person, place, and time. He appears well-developed  and well-nourished.  Neck: Neck supple. No JVD present. No thyromegaly present.  Cardiovascular: Normal rate, normal heart sounds and intact distal pulses.  Exam reveals no gallop and no friction rub.   No murmur heard. Pulmonary/Chest: Effort normal and breath sounds normal. No respiratory distress. He has no wheezes. He has no rales.  Abdominal: Soft. Bowel sounds are normal. He exhibits no distension. There is no tenderness. There is no rebound and no guarding.  Musculoskeletal: Normal range of motion. He exhibits no edema.  Lymphadenopathy:    He has no cervical adenopathy.   Neurological: He is alert and oriented to person, place, and time. No cranial nerve deficit. He exhibits normal muscle tone. Coordination normal.  Skin: No rash noted.          Assessment & Plan:  1. A-fib Increase Coumadin to 10 mg every day except Monday and Friday. On Monday and Friday take 5 mg. Recheck INR in 2 weeks - PT with INR/Fingerstick; Standing - PT with INR/Fingerstick  2. Type II or unspecified type diabetes mellitus without mention of complication, uncontrolled Continue Levemir 70 units at night and 40 units in the morning.  Increase regular insulin to 30 units with meals. Recheck sugars in 2 weeks

## 2012-11-24 ENCOUNTER — Telehealth: Payer: Self-pay | Admitting: *Deleted

## 2012-11-24 MED ORDER — INSULIN DETEMIR 100 UNIT/ML FLEXPEN: PEN_INJECTOR | SUBCUTANEOUS | Status: DC

## 2012-11-24 NOTE — Telephone Encounter (Signed)
Rx Refilled  

## 2012-11-25 ENCOUNTER — Other Ambulatory Visit: Payer: Self-pay | Admitting: Family Medicine

## 2012-11-25 NOTE — Telephone Encounter (Signed)
Medication refilled per protocol. 

## 2012-11-26 ENCOUNTER — Telehealth: Payer: Self-pay | Admitting: Family Medicine

## 2012-11-26 MED ORDER — INSULIN DETEMIR 100 UNIT/ML FLEXPEN: PEN_INJECTOR | SUBCUTANEOUS | Status: DC

## 2012-11-26 NOTE — Telephone Encounter (Signed)
Levemir is no longer using flexpen it is now flextouch. New rx sent and .Patient aware

## 2012-11-26 NOTE — Telephone Encounter (Signed)
Patient needs to have is Rx changed . [facility] is telling him that they are not carrying his Lever mire.

## 2012-12-05 ENCOUNTER — Ambulatory Visit: Payer: Medicare Other | Admitting: Family Medicine

## 2012-12-09 ENCOUNTER — Encounter: Payer: Self-pay | Admitting: Family Medicine

## 2012-12-09 ENCOUNTER — Ambulatory Visit (INDEPENDENT_AMBULATORY_CARE_PROVIDER_SITE_OTHER): Payer: Medicare Other | Admitting: Family Medicine

## 2012-12-09 VITALS — BP 164/86 | HR 80 | Temp 98.0°F | Resp 18

## 2012-12-09 DIAGNOSIS — IMO0001 Reserved for inherently not codable concepts without codable children: Secondary | ICD-10-CM

## 2012-12-09 DIAGNOSIS — Z7901 Long term (current) use of anticoagulants: Secondary | ICD-10-CM

## 2012-12-09 DIAGNOSIS — I4891 Unspecified atrial fibrillation: Secondary | ICD-10-CM

## 2012-12-09 LAB — PT WITH INR/FINGERSTICK
INR, fingerstick: 1.6 — ABNORMAL HIGH (ref 0.80–1.20)
PT, fingerstick: 18.7 seconds — ABNORMAL HIGH (ref 10.4–12.5)

## 2012-12-09 MED ORDER — WARFARIN SODIUM 10 MG PO TABS: ORAL_TABLET | ORAL | Status: DC

## 2012-12-09 NOTE — Progress Notes (Signed)
Subjective:    Patient ID: SUGAR LULGJURAJ, male    DOB: 12/26/1955, 57 y.o.   MRN: [id number]  HPI 10/30/12 Patient is a very pleasant 57 year old white male who is here today for followup. He has atrial fibrillation with a history of RPR as well a cerebrovascular accident.  He is currently on Pradaxa for stroke prevention. He is now in the doughnut hole and will no longer be able to afford the med.  He plans on resuming his previous dose of Coumadin after this month on Pradaxa.  He is a history of chronic kidney disease stage IV. He also has a history of diabetes mellitus type 2 which is poorly controlled. He has not been taking his Levemir for the last 2 weeks due to running out of the medication and not being able to afford it. He states at the beginning of next month he would be able to refer her Levemir. We have given him 3 weeks of samples to help him make it to that point. Unfortunately his sugars have been running high and she has not been taking his insulin. He was supposed to be on Humulin R 15 units before supper. He apparently is on NPH 15 units before supper. We will need to adjust this.  At that time, my plan was: 1. Type II or unspecified type diabetes mellitus without mention of complication, uncontrolled Check hemoglobin A1c today. Change NPH to Humulin R 15 units before dinner. Continue Levemir as long as financially he can afford the medication.  If not we will switch him to NPH twice a day.  Blood pressure is well controlled. I instructed the patient to resume Coumadin when he runs out of his current medication. Once he is on his previous dose of Coumadin for 2 weeks he is to come back in for an INR check. - COMPLETE METABOLIC PANEL WITH GFR - Lipid panel - Hemoglobin A1c  Office Visit on 11/21/2012  Component Date Value Range Status  . PT, fingerstick 11/21/2012 12.8* 10.4 - 12.5 seconds Final  . INR, fingerstick 11/21/2012 1.1  0.80 - 1.20 Final   Comment: The INR is of  principal utility in following patients on stable doses                          of oral anticoagulants.  The therapeutic range is generally 2.0 to                          3.0, but may be 3.0 to 4.0 in patients with mechanical cardiac valves,                          recurrent embolisms and antiphospholipid antibodies (including lupus                          inhibitors).   11/21/12 Since the last office visit, we stopped pradaxa due to cost and renal insufficiency.  He is currently on coumadin.  He has been on Coumadin for over a week. He is currently taking 10 mg on Monday Wednesdays and Fridays and 5 mg every other day. His INR today in clinic is 1.1 with a PT of 12.8. He states he may have missed one or 2 doses. He is also currently taking Levemir 70 units at night and 40 units in the morning. He is  taking regular insulin 20 units before lunch and dinner. His fasting blood sugars are 100-120. His two-hour postprandial sugars are 195-240.  He denies any hypoglycemia.  At that time, my plan was: 1. A-fib Increase Coumadin to 10 mg every day except Monday and Friday. On Monday and Friday take 5 mg. Recheck INR in 2 weeks - PT with INR/Fingerstick; Standing - PT with INR/Fingerstick  2. Type II or unspecified type diabetes mellitus without mention of complication, uncontrolled Continue Levemir 70 units at night and 40 units in the morning.  Increase regular insulin to 30 units with meals. Recheck sugars in 2 weeks  12/09/12 Patient is here today for a recheck. I reviewed his most recent office visit with Dr. Moshe Cipro.  She discontinued the lisinopril due to the frequent acute on chronic renal insufficiency. She replaced with hydralazine for hypertension.  INR today is 1.6.  Fasting blood sugars are 100 1:30. Postprandial sugars are 160-250 but generally less than 200.   Past Medical History  Diagnosis Date  . Diabetes mellitus   . Hypertension   . Atrial fibrillation   . Cardiomyopathy    . Cerebrovascular accident   . Hyperlipidemia    Current Outpatient Prescriptions on File Prior to Visit  Medication Sig Dispense Refill  . amLODipine (NORVASC) 10 MG tablet TAKE ONE TABLET BY MOUTH EVERY DAY(PLEASE CONTACT DR OFFICE TO SCHEDULE APPOINTMENT FOR FUTURE REFILLS)  30 tablet  4  . aspirin 81 MG tablet Take 81 mg by mouth daily.        . carvedilol (COREG) 25 MG tablet TAKE ONE TABLET BY MOUTH TWICE DAILY  30 tablet  12  . dabigatran (PRADAXA) 75 MG CAPS Take 1 capsule (75 mg total) by mouth every 12 (twelve) hours.  60 capsule  11  . Insulin Detemir (LEVEMIR FLEXTOUCH) 100 UNIT/ML SOPN 70 units qam and 40 units qhs  15 pen  5  . insulin regular (HUMULIN R) 100 units/mL injection Inject 0.15 mLs (15 Units total) into the skin 3 (three) times daily before meals.  10 mL  5  . Insulin Syringe-Needle U-100 (INSULIN SYRINGE .3CC/31GX5/16") 31G X 5/16" 0.3 ML MISC Use once daily to inject insulin.       . Insulin Syringes, Disposable, U-100 1 ML MISC by Does not apply route. ReliOn insulin syringes  Capacity - 3/48ml - gauge - 30 - 2mm      . lisinopril (PRINIVIL,ZESTRIL) 20 MG tablet TAKE ONE TABLET BY MOUTH EVERY DAY  30 tablet  5  . lisinopril (PRINIVIL,ZESTRIL) 40 MG tablet Take 20 mg by mouth daily. Take 20 mg daily      . ONE TOUCH ULTRA TEST test strip        No current facility-administered medications on file prior to visit.   Allergies  Allergen Reactions  . Codeine    History   Social History  . Marital Status: Divorced    Spouse Name: N/A    Number of Children: N/A  . Years of Education: N/A   Occupational History  . Not on file.   Social History Main Topics  . Smoking status: Never Smoker   . Smokeless tobacco: Never Used  . Alcohol Use: No  . Drug Use: Not on file  . Sexual Activity: Not on file   Other Topics Concern  . Not on file   Social History Narrative   He has children.   Family History  Problem Relation Age of Onset  . Hyperlipidemia  Father   .  Hypertension Father   . Coronary artery disease Neg Hx     no early CAD.       Review of Systems  All other systems reviewed and are negative.       Objective:   Physical Exam  Vitals reviewed. Constitutional: He is oriented to person, place, and time. He appears well-developed and well-nourished.  Neck: Neck supple. No JVD present. No thyromegaly present.  Cardiovascular: Normal rate, normal heart sounds and intact distal pulses.  Exam reveals no gallop and no friction rub.   No murmur heard. Pulmonary/Chest: Effort normal and breath sounds normal. No respiratory distress. He has no wheezes. He has no rales.  Abdominal: Soft. Bowel sounds are normal. He exhibits no distension. There is no tenderness. There is no rebound and no guarding.  Musculoskeletal: Normal range of motion. He exhibits no edema.  Lymphadenopathy:    He has no cervical adenopathy.  Neurological: He is alert and oriented to person, place, and time. No cranial nerve deficit. He exhibits normal muscle tone. Coordination normal.  Skin: No rash noted.          Assessment & Plan:   1. Long term (current) use of anticoagulants - PT with INR/Fingerstick  2. A-fib Increase Coumadin to 10 mg every day and recheck INR in 2 weeks.  3. Type II or unspecified type diabetes mellitus without mention of complication, uncontrolled James v 70 units in the morning and 40 units at night. Continue Humulin R 30 units with supper. Recheck fasting blood sugar and two-hour postprandial sugars in 2 weeks.

## 2012-12-16 ENCOUNTER — Other Ambulatory Visit: Payer: Self-pay | Admitting: Family Medicine

## 2012-12-16 NOTE — Telephone Encounter (Signed)
Medication refilled per protocol. 

## 2012-12-23 ENCOUNTER — Encounter: Payer: Self-pay | Admitting: Family Medicine

## 2012-12-23 ENCOUNTER — Ambulatory Visit (INDEPENDENT_AMBULATORY_CARE_PROVIDER_SITE_OTHER): Payer: Medicare Other | Admitting: Family Medicine

## 2012-12-23 VITALS — BP 126/72 | HR 74 | Temp 97.8°F | Resp 16 | Wt 208.0 lb

## 2012-12-23 DIAGNOSIS — IMO0001 Reserved for inherently not codable concepts without codable children: Secondary | ICD-10-CM

## 2012-12-23 DIAGNOSIS — I4891 Unspecified atrial fibrillation: Secondary | ICD-10-CM

## 2012-12-23 LAB — PT WITH INR/FINGERSTICK
INR, fingerstick: 1.7 — ABNORMAL HIGH (ref 0.80–1.20)
PT, fingerstick: 20.5 seconds — ABNORMAL HIGH (ref 10.4–12.5)

## 2012-12-23 NOTE — Progress Notes (Signed)
Subjective:    Patient ID: Brian [name], male    DOB: 08-22-1955, 57 y.o.   MRN: [id number]  HPI 10/30/12 Patient is a very pleasant 57 year old white male who is here today for followup. He has atrial fibrillation with a history of RPR as well a cerebrovascular accident.  He is currently on Pradaxa for stroke prevention. He is now in the doughnut hole and will no longer be able to afford the med.  He plans on resuming his previous dose of Coumadin after this month on Pradaxa.  He is a history of chronic kidney disease stage IV. He also has a history of diabetes mellitus type 2 which is poorly controlled. He has not been taking his Levemir for the last 2 weeks due to running out of the medication and not being able to afford it. He states at the beginning of next month he would be able to refer her Levemir. We have given him 3 weeks of samples to help him make it to that point. Unfortunately his sugars have been running high and she has not been taking his insulin. He was supposed to be on Humulin R 15 units before supper. He apparently is on NPH 15 units before supper. We will need to adjust this.  At that time, my plan was: 1. Type II or unspecified type diabetes mellitus without mention of complication, uncontrolled Check hemoglobin A1c today. Change NPH to Humulin R 15 units before dinner. Continue Levemir as long as financially he can afford the medication.  If not we will switch him to NPH twice a day.  Blood pressure is well controlled. I instructed the patient to resume Coumadin when he runs out of his current medication. Once he is on his previous dose of Coumadin for 2 weeks he is to come back in for an INR check. - COMPLETE METABOLIC PANEL WITH GFR - Lipid panel - Hemoglobin A1c  Office Visit on 12/09/2012  Component Date Value Range Status  . PT, fingerstick 12/09/2012 18.7* 10.4 - 12.5 seconds Final  . INR, fingerstick 12/09/2012 1.6* 0.80 - 1.20 Final   Comment: The INR is of  principal utility in following patients on stable doses                          of oral anticoagulants.  The therapeutic range is generally 2.0 to                          3.0, but may be 3.0 to 4.0 in patients with mechanical cardiac valves,                          recurrent embolisms and antiphospholipid antibodies (including lupus                          inhibitors).   11/21/12 Since the last office visit, we stopped pradaxa due to cost and renal insufficiency.  He is currently on coumadin.  He has been on Coumadin for over a week. He is currently taking 10 mg on Monday Wednesdays and Fridays and 5 mg every other day. His INR today in clinic is 1.1 with a PT of 12.8. He states he may have missed one or 2 doses. He is also currently taking Levemir 70 units at night and 40 units in the morning. He is taking  regular insulin 20 units before lunch and dinner. His fasting blood sugars are 100-120. His two-hour postprandial sugars are 195-240.  He denies any hypoglycemia.  At that time, my plan was: 1. A-fib Increase Coumadin to 10 mg every day except Monday and Friday. On Monday and Friday take 5 mg. Recheck INR in 2 weeks - PT with INR/Fingerstick; Standing - PT with INR/Fingerstick  2. Type II or unspecified type diabetes mellitus without mention of complication, uncontrolled Continue Levemir 70 units at night and 40 units in the morning.  Increase regular insulin to 30 units with meals. Recheck sugars in 2 weeks  12/09/12 Patient is here today for a recheck. I reviewed his most recent office visit with Dr. Moshe Cipro.  She discontinued the lisinopril due to the frequent acute on chronic renal insufficiency. She replaced with hydralazine for hypertension.  INR today is 1.6.  Fasting blood sugars are 100-130. Postprandial sugars are 160-250 but generally less than 200.  At that time, my plan was: 1. Long term (current) use of anticoagulants - PT with INR/Fingerstick  2. A-fib Increase  Coumadin to 10 mg every day and recheck INR in 2 weeks.  3. Type II or unspecified type diabetes mellitus without mention of complication, uncontrolled Levemir 70 units in the morning and 40 units at night. Continue Humulin R 30 units with supper. Recheck fasting blood sugar and two-hour postprandial sugars in 2 weeks.  12/23/12 He is here today for follow up.  His INR is 1.7 although he did miss a dose of coumadin this week.  He is currently taking 10 mg poqday.  His fasting sugars are acceptable at 100-150 and 2 hour PPS are acceptable at 85-181.     Past Medical History  Diagnosis Date  . Diabetes mellitus   . Hypertension   . Atrial fibrillation   . Cardiomyopathy   . Cerebrovascular accident   . Hyperlipidemia    Current Outpatient Prescriptions on File Prior to Visit  Medication Sig Dispense Refill  . amLODipine (NORVASC) 10 MG tablet TAKE ONE TABLET BY MOUTH EVERY DAY(PLEASE CONTACT DR OFFICE TO SCHEDULE APPOINTMENT FOR FUTURE REFILLS)  30 tablet  4  . aspirin 81 MG tablet Take 81 mg by mouth daily.        . carvedilol (COREG) 25 MG tablet TAKE ONE TABLET BY MOUTH TWICE DAILY  60 tablet  5  . hydrALAZINE (APRESOLINE) 50 MG tablet Take 1 tablet by mouth 2 (two) times daily.      . Insulin Detemir (LEVEMIR FLEXTOUCH) 100 UNIT/ML SOPN 70 units qam and 40 units qhs  15 pen  5  . insulin regular (HUMULIN R) 100 units/mL injection Inject 0.15 mLs (15 Units total) into the skin 3 (three) times daily before meals.  10 mL  5  . Insulin Syringe-Needle U-100 (INSULIN SYRINGE .3CC/31GX5/16") 31G X 5/16" 0.3 ML MISC Use once daily to inject insulin.       . Insulin Syringes, Disposable, U-100 1 ML MISC by Does not apply route. ReliOn insulin syringes  Capacity - 3/46ml - gauge - 30 - 57mm      . ONE TOUCH ULTRA TEST test strip       . warfarin (COUMADIN) 10 MG tablet 10 MG everyday  30 tablet  5   No current facility-administered medications on file prior to visit.   Allergies  Allergen  Reactions  . Codeine    History   Social History  . Marital Status: Divorced    Spouse  Name: N/A    Number of Children: N/A  . Years of Education: N/A   Occupational History  . Not on file.   Social History Main Topics  . Smoking status: Never Smoker   . Smokeless tobacco: Never Used  . Alcohol Use: No  . Drug Use: Not on file  . Sexual Activity: Not on file   Other Topics Concern  . Not on file   Social History Narrative   He has children.   Family History  Problem Relation Age of Onset  . Hyperlipidemia Father   . Hypertension Father   . Coronary artery disease Neg Hx     no early CAD.       Review of Systems  All other systems reviewed and are negative.       Objective:   Physical Exam  Vitals reviewed. Constitutional: He is oriented to person, place, and time. He appears well-developed and well-nourished.  Neck: Neck supple. No JVD present. No thyromegaly present.  Cardiovascular: Normal rate, normal heart sounds and intact distal pulses.  Exam reveals no gallop and no friction rub.   No murmur heard. Pulmonary/Chest: Effort normal and breath sounds normal. No respiratory distress. He has no wheezes. He has no rales.  Abdominal: Soft. Bowel sounds are normal. He exhibits no distension. There is no tenderness. There is no rebound and no guarding.  Musculoskeletal: Normal range of motion. He exhibits no edema.  Lymphadenopathy:    He has no cervical adenopathy.  Neurological: He is alert and oriented to person, place, and time. No cranial nerve deficit. He exhibits normal muscle tone. Coordination normal.  Skin: No rash noted.          Assessment & Plan:   1. A-fib Change Coumadin to 15 mg on Mondays and Fridays and 10 mg every other day and recheck INR in 4 weeks - PT with INR/Fingerstick  2. Type II or unspecified type diabetes mellitus without mention of complication, uncontrolled Continue Levemir 70 units in the morning and 40 units in the  evening with regular insulin 30 units at suppertime.

## 2013-01-13 ENCOUNTER — Encounter: Payer: Self-pay | Admitting: Family Medicine

## 2013-01-13 NOTE — Telephone Encounter (Signed)
Needing a insulin not a refill but he just needs insulin [phone]

## 2013-01-14 NOTE — Telephone Encounter (Signed)
This encounter was created in error - please disregard.

## 2013-01-15 ENCOUNTER — Encounter (HOSPITAL_COMMUNITY): Payer: Self-pay | Admitting: Radiology

## 2013-01-15 ENCOUNTER — Emergency Department (HOSPITAL_COMMUNITY): Payer: Medicare Other

## 2013-01-15 ENCOUNTER — Inpatient Hospital Stay (HOSPITAL_COMMUNITY)
Admission: EM | Admit: 2013-01-15 | Discharge: 2013-01-18 | DRG: 101 | Disposition: A | Discharge status: Home Health | Payer: Medicare Other | Attending: Internal Medicine | Admitting: Internal Medicine

## 2013-01-15 DIAGNOSIS — I4891 Unspecified atrial fibrillation: Secondary | ICD-10-CM

## 2013-01-15 DIAGNOSIS — R269 Unspecified abnormalities of gait and mobility: Secondary | ICD-10-CM | POA: Diagnosis present

## 2013-01-15 DIAGNOSIS — Y92009 Unspecified place in unspecified non-institutional (private) residence as the place of occurrence of the external cause: Secondary | ICD-10-CM

## 2013-01-15 DIAGNOSIS — N179 Acute kidney failure, unspecified: Secondary | ICD-10-CM

## 2013-01-15 DIAGNOSIS — E1165 Type 2 diabetes mellitus with hyperglycemia: Secondary | ICD-10-CM | POA: Diagnosis present

## 2013-01-15 DIAGNOSIS — I5020 Unspecified systolic (congestive) heart failure: Secondary | ICD-10-CM | POA: Diagnosis present

## 2013-01-15 DIAGNOSIS — E119 Type 2 diabetes mellitus without complications: Secondary | ICD-10-CM | POA: Diagnosis present

## 2013-01-15 DIAGNOSIS — G40909 Epilepsy, unspecified, not intractable, without status epilepticus: Secondary | ICD-10-CM | POA: Insufficient documentation

## 2013-01-15 DIAGNOSIS — G40109 Localization-related (focal) (partial) symptomatic epilepsy and epileptic syndromes with simple partial seizures, not intractable, without status epilepticus: Principal | ICD-10-CM | POA: Diagnosis present

## 2013-01-15 DIAGNOSIS — IMO0001 Reserved for inherently not codable concepts without codable children: Secondary | ICD-10-CM

## 2013-01-15 DIAGNOSIS — E876 Hypokalemia: Secondary | ICD-10-CM | POA: Diagnosis present

## 2013-01-15 DIAGNOSIS — E785 Hyperlipidemia, unspecified: Secondary | ICD-10-CM | POA: Diagnosis present

## 2013-01-15 DIAGNOSIS — I6350 Cerebral infarction due to unspecified occlusion or stenosis of unspecified cerebral artery: Secondary | ICD-10-CM

## 2013-01-15 DIAGNOSIS — I10 Essential (primary) hypertension: Secondary | ICD-10-CM | POA: Diagnosis present

## 2013-01-15 DIAGNOSIS — G40901 Epilepsy, unspecified, not intractable, with status epilepticus: Secondary | ICD-10-CM | POA: Diagnosis present

## 2013-01-15 DIAGNOSIS — W19XXXA Unspecified fall, initial encounter: Secondary | ICD-10-CM | POA: Diagnosis present

## 2013-01-15 DIAGNOSIS — Z7901 Long term (current) use of anticoagulants: Secondary | ICD-10-CM

## 2013-01-15 DIAGNOSIS — IMO0002 Reserved for concepts with insufficient information to code with codable children: Secondary | ICD-10-CM | POA: Diagnosis present

## 2013-01-15 DIAGNOSIS — R29898 Other symptoms and signs involving the musculoskeletal system: Secondary | ICD-10-CM | POA: Diagnosis present

## 2013-01-15 DIAGNOSIS — Z794 Long term (current) use of insulin: Secondary | ICD-10-CM

## 2013-01-15 DIAGNOSIS — Z8673 Personal history of transient ischemic attack (TIA), and cerebral infarction without residual deficits: Secondary | ICD-10-CM

## 2013-01-15 DIAGNOSIS — E875 Hyperkalemia: Secondary | ICD-10-CM

## 2013-01-15 DIAGNOSIS — R569 Unspecified convulsions: Secondary | ICD-10-CM

## 2013-01-15 DIAGNOSIS — I509 Heart failure, unspecified: Secondary | ICD-10-CM | POA: Diagnosis present

## 2013-01-15 LAB — DIFFERENTIAL
Eosinophils Relative: 2 % (ref 0–5)
Monocytes Absolute: 1 10*3/uL (ref 0.1–1.0)
Monocytes Relative: 8 % (ref 3–12)
Neutro Abs: 6.7 10*3/uL (ref 1.7–7.7)
Neutrophils Relative %: 53 % (ref 43–77)

## 2013-01-15 LAB — PHENYTOIN LEVEL, TOTAL: Phenytoin Lvl: 18.7 ug/mL (ref 10.0–20.0)

## 2013-01-15 LAB — PROTIME-INR
INR: 1.43 (ref 0.00–1.49)
Prothrombin Time: 17.1 seconds — ABNORMAL HIGH (ref 11.6–15.2)

## 2013-01-15 LAB — COMPREHENSIVE METABOLIC PANEL
ALT: 37 U/L (ref 0–53)
AST: 30 U/L (ref 0–37)
CO2: 16 mEq/L — ABNORMAL LOW (ref 19–32)
Chloride: 98 mEq/L (ref 96–112)
GFR calc non Af Amer: 37 mL/min — ABNORMAL LOW (ref >90)
Sodium: 135 mEq/L (ref 135–145)
Total Bilirubin: 0.3 mg/dL (ref 0.3–1.2)

## 2013-01-15 LAB — CBC
HCT: 47.5 % (ref 39.0–52.0)
Hemoglobin: 16.5 g/dL (ref 13.0–17.0)
MCH: 29.2 pg (ref 26.0–34.0)
MCHC: 34.7 g/dL (ref 30.0–36.0)
MCV: 83.9 fL (ref 78.0–100.0)
Platelets: 268 10*3/uL (ref 150–400)
RBC: 5.66 MIL/uL (ref 4.22–5.81)
WBC: 12.7 10*3/uL — ABNORMAL HIGH (ref 4.0–10.5)

## 2013-01-15 LAB — POCT I-STAT, CHEM 8
BUN: 25 mg/dL — ABNORMAL HIGH (ref 6–23)
Calcium, Ion: 1.21 mmol/L (ref 1.12–1.23)
Chloride: 107 mEq/L (ref 96–112)
HCT: 49 % (ref 39.0–52.0)
Potassium: 3.4 mEq/L — ABNORMAL LOW (ref 3.5–5.1)
Sodium: 140 mEq/L (ref 135–145)

## 2013-01-15 LAB — GLUCOSE, CAPILLARY: Glucose-Capillary: 97 mg/dL (ref 70–99)

## 2013-01-15 LAB — TROPONIN I: Troponin I: <0.3 ng/mL (ref <0.30)

## 2013-01-15 LAB — POCT I-STAT TROPONIN I: Troponin i, poc: 0.02 ng/mL (ref 0.00–0.08)

## 2013-01-15 MED ORDER — SODIUM CHLORIDE 0.9 % IJ SOLN
3.0000 mL | Freq: Two times a day (BID) | INTRAMUSCULAR | Status: DC
  Administered 2013-01-16 – 2013-01-18 (×5): 3 mL via INTRAVENOUS

## 2013-01-15 MED ORDER — ONDANSETRON HCL 4 MG/2ML IJ SOLN: 4.0000 mg | Freq: Four times a day (QID) | INTRAMUSCULAR | Status: DC | PRN

## 2013-01-15 MED ORDER — AMLODIPINE BESYLATE 10 MG PO TABS
10.0000 mg | ORAL_TABLET | Freq: Every day | ORAL | Status: DC
  Administered 2013-01-16 – 2013-01-18 (×3): 10 mg via ORAL
  Filled 2013-01-15 (×4): qty 1

## 2013-01-15 MED ORDER — LORAZEPAM 2 MG/ML IJ SOLN
INTRAMUSCULAR | Status: AC
  Filled 2013-01-15: qty 1

## 2013-01-15 MED ORDER — PHENYTOIN SODIUM 50 MG/ML IJ SOLN
100.0000 mg | Freq: Three times a day (TID) | INTRAMUSCULAR | Status: DC
  Administered 2013-01-15 – 2013-01-16 (×2): 100 mg via INTRAVENOUS
  Filled 2013-01-15 (×5): qty 2

## 2013-01-15 MED ORDER — INSULIN ASPART 100 UNIT/ML ~~LOC~~ SOLN
0.0000 [IU] | SUBCUTANEOUS | Status: DC
  Administered 2013-01-16: 22:00:00 via SUBCUTANEOUS
  Administered 2013-01-16: 3 [IU] via SUBCUTANEOUS
  Administered 2013-01-17 (×2): 2 [IU] via SUBCUTANEOUS

## 2013-01-15 MED ORDER — CARVEDILOL 25 MG PO TABS
25.0000 mg | ORAL_TABLET | Freq: Two times a day (BID) | ORAL | Status: DC
  Administered 2013-01-16 – 2013-01-18 (×5): 25 mg via ORAL
  Filled 2013-01-15 (×7): qty 1

## 2013-01-15 MED ORDER — LORAZEPAM 2 MG/ML IJ SOLN
2.0000 mg | Freq: Once | INTRAMUSCULAR | Status: AC
  Administered 2013-01-15: 2 mg via INTRAVENOUS

## 2013-01-15 MED ORDER — LORAZEPAM 2 MG/ML IJ SOLN
INTRAMUSCULAR | Status: AC
  Administered 2013-01-15: 2 mg
  Filled 2013-01-15: qty 1

## 2013-01-15 MED ORDER — ONDANSETRON HCL 4 MG PO TABS: 4.0000 mg | ORAL_TABLET | Freq: Four times a day (QID) | ORAL | Status: DC | PRN

## 2013-01-15 MED ORDER — HYDRALAZINE HCL 50 MG PO TABS
50.0000 mg | ORAL_TABLET | Freq: Two times a day (BID) | ORAL | Status: DC
  Administered 2013-01-16 – 2013-01-18 (×5): 50 mg via ORAL
  Filled 2013-01-15 (×7): qty 1

## 2013-01-15 MED ORDER — WARFARIN SODIUM 10 MG PO TABS
10.0000 mg | ORAL_TABLET | Freq: Once | ORAL | Status: AC
  Administered 2013-01-15: 10 mg via ORAL
  Filled 2013-01-15: qty 1

## 2013-01-15 MED ORDER — DEXTROSE-NACL 5-0.9 % IV SOLN
INTRAVENOUS | Status: DC
  Administered 2013-01-15: 22:00:00 via INTRAVENOUS

## 2013-01-15 MED ORDER — LEVETIRACETAM 500 MG/5ML IV SOLN
1500.0000 mg | Freq: Once | INTRAVENOUS | Status: DC
  Filled 2013-01-15: qty 15

## 2013-01-15 MED ORDER — SODIUM CHLORIDE 0.9 % IV SOLN
1800.0000 mg | Freq: Once | INTRAVENOUS | Status: AC
  Administered 2013-01-15: 1800 mg via INTRAVENOUS
  Filled 2013-01-15: qty 36

## 2013-01-15 MED ORDER — LORAZEPAM 2 MG/ML IJ SOLN
2.0000 mg | Freq: Once | INTRAMUSCULAR | Status: AC
  Administered 2013-01-15: 2 mg via INTRAVENOUS
  Filled 2013-01-15: qty 1

## 2013-01-15 MED ORDER — WARFARIN - PHARMACIST DOSING INPATIENT: Freq: Every day | Status: DC

## 2013-01-15 NOTE — ED Provider Notes (Signed)
I saw and evaluated the patient, reviewed the resident's note and I agree with the findings and plan.  EKG Interpretation    Date/Time:  Thursday January 15 2013 18:01:40 EST Ventricular Rate:  99 PR Interval:  183 QRS Duration: 83 QT Interval:  360 QTC Calculation: 462 R Axis:   52 Text Interpretation:  Sinus rhythm Baseline wander in lead(s) V1 V5 No significant change since last tracing Confirmed by Maryan Rued  MD, Lourene Hoston ([id number]) on 01/15/2013 6:19:11 PM            Patient initially presenting as a code stroke but upon arrival exam patient is in status. The seizure broke after multiple rounds of Ativan and Dilantin infusion patient admitted for further care  Blanchie Dessert, MD 01/15/13 2318

## 2013-01-15 NOTE — H&P (Signed)
[facility] History and Physical  Brian [name] [id number] DOB: 1955/05/25    PCP:   Odette Fraction, MD   Chief Complaint: increased leftsided weakness, and seizing.  HPI: Brian [name] is an 57 y.o. male with hx of DM, HTN, recent CVA, hyperlipidemia, and hx of seizure (still drives and on no ACT) presents to the ER as a code stroke as he was having sudden increase left upper extremity weakness, ictus around 5pm, followed by a tonic clonic seizure seen in the ER.  He was given IV ativan, and seen by Neurology Dr Leonel Ramsay as a code stroke.  He had a period when he was able to converse, but when I saw him, he was well sedated.  Dilantin at 20mg  per kg was given as a bolus.  I was only able to get residual history from his daughter.  He was a stoic man according to her, and had a stroke with left upper extremity weakness residual, but highly functional.  She didn't know he even had a seizure, and he still drives.  He lives with his mother.  Hospitalist was asked to admit him for continued care.   Rewiew of Systems: Unable.  Past Medical History  Diagnosis Date  . Diabetes mellitus   . Hypertension   . Atrial fibrillation   . Cardiomyopathy   . Cerebrovascular accident   . Hyperlipidemia     Past Surgical History  Procedure Laterality Date  . Ear surgery as a child      Medications:  HOME MEDS: Prior to Admission medications   Medication Sig Start Date End Date Taking? Authorizing Provider  amLODipine (NORVASC) 10 MG tablet Take 10 mg by mouth daily.   Yes Historical Provider, MD  carvedilol (COREG) 25 MG tablet Take 25 mg by mouth 2 (two) times daily with a meal.   Yes Historical Provider, MD  hydrALAZINE (APRESOLINE) 50 MG tablet Take 50 mg by mouth 2 (two) times daily.  11/28/12  Yes Historical Provider, MD  Insulin Glargine (LANTUS SOLOSTAR) 100 UNIT/ML SOPN Inject into the skin.   Yes Historical Provider, MD  insulin regular (NOVOLIN R,HUMULIN R) 100  units/mL injection Inject into the skin 3 (three) times daily before meals.   Yes Historical Provider, MD  warfarin (COUMADIN) 10 MG tablet Take 10 mg by mouth daily.   Yes Historical Provider, MD  Insulin Syringe-Needle U-100 (INSULIN SYRINGE .3CC/31GX5/16") 31G X 5/16" 0.3 ML MISC Use once daily to inject insulin.     Historical Provider, MD  Insulin Syringes, Disposable, U-100 1 ML MISC by Does not apply route. ReliOn insulin syringes  Capacity - 3/72ml - gauge - 30 - 60mm    Historical Provider, MD  ONE TOUCH ULTRA TEST test strip  11/26/11   Historical Provider, MD     Allergies:  Allergies  Allergen Reactions  . Codeine Other (See Comments)    Unknown reaction    Social History:   reports that he has never smoked. He has never used smokeless tobacco. He reports that he does not drink alcohol. His drug history is not on file.  Family History: Family History  Problem Relation Age of Onset  . Hyperlipidemia Father   . Hypertension Father   . Coronary artery disease Neg Hx     no early CAD.     Physical Exam: Filed Vitals:   01/15/13 2000 01/15/13 2015 01/15/13 2030 01/15/13 2045  BP: 111/71 126/75 115/72 115/69  Pulse: 74 72 72 70  Temp:  TempSrc:      Resp: 19 26 21 30   SpO2: 98% 99% 98% 98%   Blood pressure 115/69, pulse 70, temperature 97.6 F (36.4 C), temperature source Oral, resp. rate 30, SpO2 98.00%.  GEN:  patient lying in the stretcher in no acute distress; well sedated, maintaining spontaneous respiration. HEENT: Mucous membranes pink and anicteric; pinpoint pupils.  no cervical lymphadenopathy nor thyromegaly or carotid bruit; no JVD; There were no stridor.  Breasts:: Not examined CHEST WALL: No tenderness CHEST: Normal respiration, clear to auscultation bilaterally.  HEART: Regular rate and rhythm.  There are no murmur, rub, or gallops.   BACK: No kyphosis or scoliosis; no CVA tenderness ABDOMEN: soft and non-tender; no masses, no organomegaly, normal  abdominal bowel sounds; no pannus; no intertriginous candida. There is no rebound and no distention. Rectal Exam: Not done EXTREMITIES: No bone or joint deformity; age-appropriate arthropathy of the hands and knees; no edema; no ulcerations.   Genitalia: not examined PULSES: 2+ and symmetric SKIN: Normal hydration no rash or ulceration CNS: Unresponsive.  Spontaneously moves both feet infrequently. Labs on Admission:  Basic Metabolic Panel:  Recent Labs Lab 01/15/13 1743 01/15/13 1759  NA 135 140  K 4.0 3.4*  CL 98 107  CO2 16*  --   GLUCOSE 91 92  BUN 24* 25*  CREATININE 1.92* 2.30*  CALCIUM 9.5  --    Liver Function Tests:  Recent Labs Lab 01/15/13 1743  AST 30  ALT 37  ALKPHOS 100  BILITOT 0.3  PROT 8.0  ALBUMIN 4.2   No results found for this basename: LIPASE, AMYLASE,  in the last 168 hours No results found for this basename: AMMONIA,  in the last 168 hours CBC:  Recent Labs Lab 01/15/13 1743 01/15/13 1759  WBC 12.7*  --   NEUTROABS 6.7  --   HGB 16.5 16.7  HCT 47.5 49.0  MCV 83.9  --   PLT 268  --    Cardiac Enzymes:  Recent Labs Lab 01/15/13 1743  TROPONINI <0.30    CBG:  Recent Labs Lab 01/15/13 1804  GLUCAP 97     Radiological Exams on Admission: Ct Head Wo Contrast  01/15/2013   CLINICAL DATA:  Left leg weakness. Seizure activity. Previous strokes.  EXAM: CT HEAD WITHOUT CONTRAST  TECHNIQUE: Contiguous axial images were obtained from the base of the skull through the vertex without intravenous contrast.  COMPARISON:  09/20/2010  FINDINGS: There is no evidence of intracranial hemorrhage, brain edema, or other signs of acute infarction. There is no evidence of intracranial mass lesion or mass effect. No abnormal extraaxial fluid collections are identified.  Old right parietal lobe infarct is again seen. Moderate chronic small vessel disease is unchanged in appearance. Old left basal ganglia lacune and adjacent deep white matter infarct  also unchanged in appearance. Ventricles are stable in size. No skull abnormality identified.  IMPRESSION: No acute intracranial findings.  Stable old infarcts and chronic small vessel disease, as described above.   Electronically Signed   By: Earle Gell M.D.   On: 01/15/2013 18:09   Assessment/Plan Present on Admission:  . CONGESTIVE HEART FAILURE, SYSTOLIC DYSFUNCTION . DIABETES MELLITUS, TYPE II, UNCONTROLLED . Essential hypertension, benign . HYPERLIPIDEMIA Status Epilepticus +++  PLAN:  Willl admit him to SDU for closer monitoring.  Hopefully, he will become awake to be able to take his meds orally, otherwise, will change to IV.   Dilantin was loaded at 20mg kg, will check a level post bolus  and give 300mg  total daily in devided doses.  For his afib, will continue his anticoagulation.  For his DM, will give Dextrose IVF, and follow his BS.  IF he is able to eat, will give carb modified diet.  He will be monitor closely in the SDU. I will obtain EEG, though would not change treatment.  He is a presumped full code.  I told his daughter he must not drive until deemed safe.  Thank you for allowing me to participate in his care.  Other plans as per orders.  Code Status: FULL Haskel Khan, MD. [facility] Pager [phone] 7pm to 7am.  01/15/2013, 9:02 PM

## 2013-01-15 NOTE — ED Notes (Signed)
Pt fell, no trauma at 1700 and had left sided weakness.  Pt arrives with left sided weakness, some left facial droop and looking toward the left.  Pt has some spastic movement of left arm, no movement of left leg on arrival.  Pt seen by EDP and neurologist.  Pt has left leg twitching movements and left gaze on arrival in CT and notifies MD that he had left sided weakness in the past with a seizure.  No IV by EMS.  IV started by RN in CT and ativan given per verbal order for active seizure

## 2013-01-15 NOTE — Consult Note (Signed)
Neurology Consultation Reason for Consult: Seizures Referring Physician: Maryan Rued, W  CC: Seizures  History is obtained from:Patient, EMS  HPI: Brian [name] is a 57 y.o. male with a history of right MCA territory infarct as well as previous seizure causing left sided weakness who presents with sudden onset left sided weakness and jerking starting at 5pm. En route, EMS reported generalized flexing with cessation of speech, though just prior to arrival these movements stopped.   Shortly after arrival, his left leg again began to twitch.   The patient was able to tell me that he had a previous seizure about 2 years ago and had left sided weakness after that seizure. (though family states later they were not aware of this)   LKW: 5 pm  tpa given?: no, presented in status epilepticus.    ROS: A 14 point ROS was performed and is negative except as noted in the HPI.  Past Medical History  Diagnosis Date  . Diabetes mellitus   . Hypertension   . Atrial fibrillation   . Cardiomyopathy   . Cerebrovascular accident   . Hyperlipidemia     Family History: Father - htn, hpl  Social History: Tob: never smoker  Exam: Current vital signs: BP 150/98 Pulse: 90 RR: 15 Vital signs in last 24 hours:    General: in bed,  CV: irregular Mental Status: Patient is awake, alert, oriented to person, place, month, year, and situation Patient is able to give a clear and coherent history. No signs of aphasia  Cranial Nerves: II: Visual Fields are difficult to test due to gaze deviation, but does blink to threat. Pupils are equal, round, and reactive to light.  Discs are difficult to visualize. III,IV, VI: left forced gaze deviation V: Facial sensation is decreased on left.  VII: Facial movement is slightly decreased on left.  VIII: hearing is intact to voice X: Uvula elevates symmetrically XI: Shoulder shrug is symmetric. XII: tongue is midline without atrophy or fasciculations.   Motor: Tone is normal. Bulk is normal. 5/5 strength was present on the right, on the left he has4/5 strength with dyscoordination in the left arm and unable to move left leg.  Sensory: Sensation is decreased throughout left side.  Deep Tendon Reflexes: 2+ and symmetric in the biceps and unable to elicit in the left patella due to seizure Cerebellar: No clear ataxia on right Gait: Not tested due to seizure.    I have reviewed labs in epic and the results pertinent to this consultation are: INR 1.4  I have reviewed the images obtained:CT head - no acute findings, but does have previous right MCA territory infarct  Impression: 57 yo M with self-reported single seizure in the past who presents in partial status epilepticus. He has received ativan 6mg  and is currently being loaded with IV dilantin.   Recommendations: 1) dilantin 20mg /kg x 1 2) if no response would load with keppra 1500mg  IV x 1 3) given preserved consciousness, would be hesitant to pursue general anesthesia for this focal seizure 4) will continue to follow.    [name], MD [facility] [phone]  If 7pm- 7am, please page neurology on call at [phone].

## 2013-01-15 NOTE — ED Provider Notes (Signed)
CSN: [id number]     Arrival date & time 01/15/13  1738 History   First MD Initiated Contact with Patient 01/15/13 1754     Chief Complaint  Patient presents with  . Code Stroke   (Consider location/radiation/quality/duration/timing/severity/associated sxs/prior Treatment) Patient is a 57 y.o. male presenting with extremity weakness.  Extremity Weakness This is a new problem. The current episode started today (5:10pm). The problem occurs constantly. The problem has been unchanged. Nothing aggravates the symptoms. He has tried nothing for the symptoms. The treatment provided no relief.    Chief complaint left weakness  Patient is a 57 year old male past medical history significant for diabetes, atrial fibrillation, hypertension and previous CVA comes in chief complaint left side weakness. Patient was walking out of his house approximately 5:10 PM when he noted he is to onset of sided weakness including left arm and left leg. He fell to the ground onto a grassy area. Did not strike his head did not have any trauma or fall. Left-sided weakness presents to attend him. Worse on left lower extremity. Says been constant and unchanged. Has some associated mental status change per EMS. Patient also with history of seizures.  Past Medical History  Diagnosis Date  . Diabetes mellitus   . Hypertension   . Atrial fibrillation   . Cardiomyopathy   . Cerebrovascular accident   . Hyperlipidemia    Past Surgical History  Procedure Laterality Date  . Ear surgery as a child     Family History  Problem Relation Age of Onset  . Hyperlipidemia Father   . Hypertension Father   . Coronary artery disease Neg Hx     no early CAD.   History  Substance Use Topics  . Smoking status: Never Smoker   . Smokeless tobacco: Never Used  . Alcohol Use: No    Review of Systems  Unable to perform ROS: Mental status change  Musculoskeletal: Positive for extremity weakness.    Allergies  Codeine  Home  Medications   Current Outpatient Rx  Name  Route  Sig  Dispense  Refill  . amLODipine (NORVASC) 10 MG tablet   Oral   Take 10 mg by mouth daily.         . carvedilol (COREG) 25 MG tablet   Oral   Take 25 mg by mouth 2 (two) times daily with a meal.         . hydrALAZINE (APRESOLINE) 50 MG tablet   Oral   Take 50 mg by mouth 2 (two) times daily.          . Insulin Detemir (LEVEMIR FLEXTOUCH) 100 UNIT/ML SOPN   Subcutaneous   Inject 40-70 Units into the skin 2 (two) times daily. 70 units am, 40 units pm         . insulin regular (HUMULIN R) 100 units/mL injection   Subcutaneous   Inject 0.15 mLs (15 Units total) into the skin 3 (three) times daily before meals.   10 mL   5   . lisinopril (PRINIVIL,ZESTRIL) 20 MG tablet   Oral   Take 20 mg by mouth daily.          Marland Kitchen warfarin (COUMADIN) 10 MG tablet   Oral   Take 10 mg by mouth daily.         Marland Kitchen aspirin 81 MG tablet   Oral   Take 81 mg by mouth daily.           . Insulin Syringe-Needle U-100 (INSULIN  SYRINGE .3CC/31GX5/16") 31G X 5/16" 0.3 ML MISC      Use once daily to inject insulin.          . Insulin Syringes, Disposable, U-100 1 ML MISC   Does not apply   by Does not apply route. ReliOn insulin syringes  Capacity - 3/85ml - gauge - 30 - 26mm         . ONE TOUCH ULTRA TEST test strip                BP 107/67  Pulse 79  Temp(Src) 97.6 F (36.4 C) (Oral)  Resp 25  SpO2 98% Physical Exam  Nursing note and vitals reviewed. Constitutional: He is oriented to person, place, and time. He appears well-developed and well-nourished.  HENT:  Head: Normocephalic and atraumatic.  Eyes: EOM are normal. Pupils are equal, round, and reactive to light.  Neck: Normal range of motion.  Cardiovascular: Normal rate, regular rhythm and intact distal pulses.   Pulmonary/Chest: Effort normal and breath sounds normal. No respiratory distress.  Abdominal: Soft. He exhibits no distension. There is no  tenderness.  Musculoskeletal: Normal range of motion.  Neurological: He is alert and oriented to person, place, and time. No cranial nerve deficit. He exhibits abnormal muscle tone (L sided).  Left lower extremity noted weakness. Left upper extremity when struck in the move patient flails arm. Questionable nystagmus.   Skin: Skin is warm and dry. No rash noted.  Psychiatric: He is slowed.    ED Course  Procedures (including critical care time) Labs Review Labs Reviewed  PROTIME-INR - Abnormal; Notable for the following:    Prothrombin Time 17.1 (*)    All other components within normal limits  CBC - Abnormal; Notable for the following:    WBC 12.7 (*)    All other components within normal limits  DIFFERENTIAL - Abnormal; Notable for the following:    Lymphs Abs 4.7 (*)    All other components within normal limits  COMPREHENSIVE METABOLIC PANEL - Abnormal; Notable for the following:    CO2 16 (*)    BUN 24 (*)    Creatinine, Ser 1.92 (*)    GFR calc non Af Amer 37 (*)    GFR calc Af Amer 42 (*)    All other components within normal limits  POCT I-STAT, CHEM 8 - Abnormal; Notable for the following:    Potassium 3.4 (*)    BUN 25 (*)    Creatinine, Ser 2.30 (*)    All other components within normal limits  ETHANOL  APTT  TROPONIN I  GLUCOSE, CAPILLARY  URINE RAPID DRUG SCREEN (HOSP PERFORMED)  URINALYSIS, ROUTINE W REFLEX MICROSCOPIC  POCT I-STAT TROPONIN I   Imaging Review Ct Head Wo Contrast  01/15/2013   CLINICAL DATA:  Left leg weakness. Seizure activity. Previous strokes.  EXAM: CT HEAD WITHOUT CONTRAST  TECHNIQUE: Contiguous axial images were obtained from the base of the skull through the vertex without intravenous contrast.  COMPARISON:  09/20/2010  FINDINGS: There is no evidence of intracranial hemorrhage, brain edema, or other signs of acute infarction. There is no evidence of intracranial mass lesion or mass effect. No abnormal extraaxial fluid collections are  identified.  Old right parietal lobe infarct is again seen. Moderate chronic small vessel disease is unchanged in appearance. Old left basal ganglia lacune and adjacent deep white matter infarct also unchanged in appearance. Ventricles are stable in size. No skull abnormality identified.  IMPRESSION: No acute intracranial findings.  Stable old infarcts and chronic small vessel disease, as described above.   Electronically Signed   By: Earle Gell M.D.   On: 01/15/2013 18:09    EKG Interpretation    Date/Time:  Thursday January 15 2013 18:01:40 EST Ventricular Rate:  99 PR Interval:  183 QRS Duration: 83 QT Interval:  360 QTC Calculation: 462 R Axis:   52 Text Interpretation:  Sinus rhythm Baseline wander in lead(s) V1 V5 No significant change since last tracing Confirmed by Maryan Rued  MD, WHITNEY ([id number]) on 01/15/2013 6:19:11 PM            MDM   1. Seizure    Initial called to ED as code stroke. On arrival patient noted to have left-sided findings. However they seem to be most consistent with a focal seizure. Patient with history of seizures. Neurology was at bedside on arrival. A green likely seizure. Patient was given Ativan. Initial imaging showed no acute infarcts. After thorough examination with patient in partial status. Given multiple doses of Ativan as well as Dilantin. Seizure broke. Patient maintaining her airway bilateral breath sounds throughout. On reexam patient is resting more comfortably mental status improving. Patient received full Dilantin load tolerated well. Discussed with neurology, believe patient appropriate for admission to hospitalist service with neurology following. Medicine was consulted and has seen the patient and agreed to admit. No further acute issues in the emergency department. Patient transferred to floor.   Patient discussed with attending Dr. Maryan Rued.      Robynn Pane, MD 01/15/13 [phone]

## 2013-01-15 NOTE — ED Notes (Signed)
Unable to perform swallow screen at this time as pt is sedated from seizure medications.  Pt repositioned to reduce snoring with minimal relief.  VSS

## 2013-01-15 NOTE — Progress Notes (Signed)
Patient arrived in ED at 1738 via EMS.  No movement of left leg, spastic movement of left arm.  Left gaze preference, eyes able to cross midline. Able to state age and month.  Labs drawn, IV started by RN.  Left leg with seizure activity.  Patient stated he has had a seizure before and had left side weakness associated with it. 2mg  Ativan given IV.  Head CT done.  Seizure activity continued, additional 2mg  Ativan given x 2 upon arrival back to ED.  BP initally 203/100 post ativan BP 120-140s.  Dilantin started.  BP remaining 124/74.  Patient with snoring respiratory status. RN to call if assistance needed.

## 2013-01-15 NOTE — Progress Notes (Signed)
ANTICOAGULATION CONSULT NOTE - Initial Consult  Pharmacy Consult for Warfarin and Phenytoin Indication: atrial fibrillation, History of siezure and siezure on admission  Allergies  Allergen Reactions  . Codeine Other (See Comments)    Unknown reaction    Patient Measurements:     Vital Signs: Temp: 97.6 F (36.4 C) (11/27 1845) Temp src: Oral (11/27 1845) BP: 130/78 mmHg (11/27 2100) Pulse Rate: 71 (11/27 2100)  Labs:  Recent Labs  01/15/13 1743 01/15/13 1759  HGB 16.5 16.7  HCT 47.5 49.0  PLT 268  --   APTT 33  --   LABPROT 17.1*  --   INR 1.43  --   CREATININE 1.92* 2.30*  TROPONINI <0.30  --     The CrCl is unknown because both a height and weight (above a minimum accepted value) are required for this calculation.   Medical History: Past Medical History  Diagnosis Date  . Diabetes mellitus   . Hypertension   . Atrial fibrillation   . Cardiomyopathy   . Cerebrovascular accident   . Hyperlipidemia     Medications:  Prescriptions prior to admission  Medication Sig Dispense Refill  . amLODipine (NORVASC) 10 MG tablet Take 10 mg by mouth daily.      . carvedilol (COREG) 25 MG tablet Take 25 mg by mouth 2 (two) times daily with a meal.      . hydrALAZINE (APRESOLINE) 50 MG tablet Take 50 mg by mouth 2 (two) times daily.       . Insulin Glargine (LANTUS SOLOSTAR) 100 UNIT/ML SOPN Inject into the skin.      Marland Kitchen insulin regular (NOVOLIN R,HUMULIN R) 100 units/mL injection Inject into the skin 3 (three) times daily before meals.      . warfarin (COUMADIN) 10 MG tablet Take 10 mg by mouth daily.      . Insulin Syringe-Needle U-100 (INSULIN SYRINGE .3CC/31GX5/16") 31G X 5/16" 0.3 ML MISC Use once daily to inject insulin.       . Insulin Syringes, Disposable, U-100 1 ML MISC by Does not apply route. ReliOn insulin syringes  Capacity - 3/64ml - gauge - 30 - 33mm      . ONE TOUCH ULTRA TEST test strip         Assessment: 57 yo F admitted 01/15/2013 as a code STROKE  followed by a tonic clonic seizure in ER.  Pharmacy consulted to continue warfarin for afib and dose phenytoin.  PMH: DM, HTN, Afib, CVA, hyperlipidemia  Home warfarin dose 10 mg daily (admission INR 1.43)  Goal of Therapy:  INR 2-3 Monitor platelets by anticoagulation protocol: Yes   Plan:  Warfarin 10 mg PO tonight Daily INR Phenytoin 100 mg IV q8h, follow up transition to PO once passes swallow eval and plan for long term anticonvulsant therapy. Follow up post bolus dose concentration to assure patient adequately loaded   Thank you for allowing pharmacy to be a part of this patients care team.  Rowe Robert Pharm.D., BCPS Clinical Pharmacist 01/15/2013 10:10 PM Pager: [phone] Phone: [phone]

## 2013-01-16 ENCOUNTER — Ambulatory Visit (HOSPITAL_COMMUNITY): Payer: Medicare Other

## 2013-01-16 DIAGNOSIS — N179 Acute kidney failure, unspecified: Secondary | ICD-10-CM

## 2013-01-16 DIAGNOSIS — IMO0001 Reserved for inherently not codable concepts without codable children: Secondary | ICD-10-CM

## 2013-01-16 DIAGNOSIS — I4891 Unspecified atrial fibrillation: Secondary | ICD-10-CM

## 2013-01-16 LAB — CK: Total CK: 83 U/L (ref 7–232)

## 2013-01-16 LAB — BASIC METABOLIC PANEL
BUN: 18 mg/dL (ref 6–23)
CO2: 23 mEq/L (ref 19–32)
Chloride: 101 mEq/L (ref 96–112)
Glucose, Bld: 103 mg/dL — ABNORMAL HIGH (ref 70–99)
Potassium: 3.4 mEq/L — ABNORMAL LOW (ref 3.5–5.1)
Sodium: 134 mEq/L — ABNORMAL LOW (ref 135–145)

## 2013-01-16 LAB — GLUCOSE, CAPILLARY
Glucose-Capillary: 98 mg/dL (ref 70–99)
Glucose-Capillary: 68 mg/dL — ABNORMAL LOW (ref 70–99)
Glucose-Capillary: 119 mg/dL — ABNORMAL HIGH (ref 70–99)

## 2013-01-16 LAB — TROPONIN I
Troponin I: <0.3 ng/mL (ref <0.30)
Troponin I: <0.3 ng/mL (ref <0.30)

## 2013-01-16 LAB — CBC
HCT: 40.8 % (ref 39.0–52.0)
Hemoglobin: 14.6 g/dL (ref 13.0–17.0)
MCH: 30.1 pg (ref 26.0–34.0)
MCHC: 35.8 g/dL (ref 30.0–36.0)
Platelets: 206 10*3/uL (ref 150–400)
RDW: 13.2 % (ref 11.5–15.5)
WBC: 8.4 10*3/uL (ref 4.0–10.5)

## 2013-01-16 LAB — MRSA PCR SCREENING: MRSA by PCR: NEGATIVE

## 2013-01-16 LAB — PROTIME-INR: INR: 1.72 — ABNORMAL HIGH (ref 0.00–1.49)

## 2013-01-16 MED ORDER — LEVETIRACETAM 250 MG PO TABS
250.0000 mg | ORAL_TABLET | Freq: Two times a day (BID) | ORAL | Status: DC
  Administered 2013-01-16 – 2013-01-18 (×5): 250 mg via ORAL
  Filled 2013-01-16 (×6): qty 1

## 2013-01-16 MED ORDER — POTASSIUM CHLORIDE CRYS ER 20 MEQ PO TBCR
40.0000 meq | EXTENDED_RELEASE_TABLET | Freq: Once | ORAL | Status: AC
  Administered 2013-01-16: 40 meq via ORAL
  Filled 2013-01-16: qty 2

## 2013-01-16 MED ORDER — WARFARIN SODIUM 10 MG PO TABS
10.0000 mg | ORAL_TABLET | Freq: Once | ORAL | Status: AC
  Administered 2013-01-16: 10 mg via ORAL
  Filled 2013-01-16: qty 1

## 2013-01-16 NOTE — Progress Notes (Signed)
ANTICOAGULATION CONSULT NOTE - Follow Up Consult  Pharmacy Consult for Coumadin Indication: atrial fibrillation  Allergies  Allergen Reactions  . Codeine Other (See Comments)    Unknown reaction    Patient Measurements: Height: 5\' 10"  (177.8 cm) Weight: 211 lb 3.2 oz (95.8 kg) IBW/kg (Calculated) : 73  Vital Signs: Temp: 97.9 F (36.6 C) (11/28 0742) Temp src: Oral (11/28 0742) BP: 147/89 mmHg (11/28 0742) Pulse Rate: 77 (11/28 0742)  Labs:  Recent Labs  01/15/13 1743 01/15/13 1759 01/15/13 2145 01/16/13 0500 01/16/13 0905  HGB 16.5 16.7  --   --   --   HCT 47.5 49.0  --   --   --   PLT 268  --   --   --   --   APTT 33  --   --   --   --   LABPROT 17.1*  --   --  19.7*  --   INR 1.43  --   --  1.72*  --   CREATININE 1.92* 2.30*  --   --   --   TROPONINI <0.30  --  <0.30 <0.30 <0.30    Estimated Creatinine Clearance: 40.2 ml/min (by C-G formula based on Cr of 2.3).  Assessment: 57yom continues on coumadin for afib. INR remains subtherapeutic but is trending up. Phenytoin changed to keppra. CBC stable. No bleeding reported.  Home dose: 10mg  daily  Goal of Therapy:  INR 2-3 Monitor platelets by anticoagulation protocol: Yes   Plan:  1) Coumadin 10mg  x 1 tonight 2) INR in AM  Deboraha Sprang 01/16/2013,10:27 AM

## 2013-01-16 NOTE — Progress Notes (Signed)
Pt report called to 3W, meds current to time, VSS. IV out, IV team attempted, unable, will send another IV team member.

## 2013-01-16 NOTE — Evaluation (Signed)
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 He has had good recovery of his strength on the left, but still is very unsteady when stood.   Clinical Impression  Pt admitted with the above. Pt currently with functional limitations due to the deficits listed below (see PT Problem List). Pt with mild left sided weakness and left side motor planning deficits.  Pt would benefit from rehab however pt does not recognize any deficits and pt states "I'm fine.  I'm not sure why I'm still in here."  Therefore pt may refuse CIR due to no recognition of deficits.  Pt is currently impulsive with poor safety awareness as well as sustained attention.   Pt currently needs direct cues for task and simple commands.  Pt will benefit from skilled PT to increase their independence and safety with mobility to allow discharge to the venue listed below.       PT Assessment  Patient needs continued PT services    Follow Up Recommendations  CIR;Other (comment) (If pt refuses will need 24 hour assistance with HHPT)    Equipment Recommendations  Rolling walker with 5" wheels    Recommendations for Other Services Rehab consult   Frequency Min 4X/week    Precautions / Restrictions Precautions Precautions: Fall   Pertinent Vitals/Pain No c/o pain      Mobility  Bed Mobility Bed Mobility: Supine to Sit;Sit to Supine Supine to Sit: 4: Min assist;HOB flat Sit to Supine: 4: Min guard;HOB flat Details for Bed Mobility Assistance: Minguard for safety with max cues for step by step instructions for safety.  Transfers Transfers: Sit to Stand;Stand to Sit Sit to Stand: 4: Min assist;From bed Stand to Sit: 3: Mod assist;To  chair/3-in-1 Details for Transfer Assistance: (A) needed for safety; pt attempts to sit without being in appropriate position Ambulation/Gait Ambulation/Gait Assistance: 3: Mod assist;4: Min assist Ambulation Distance (Feet): 15 Feet Assistive device: None Ambulation/Gait Assistance Details: (A) to maintain balance due to staggered gait and several LOB with increase lateral sway.  Gait Pattern: Step-through pattern;Decreased stride length;Shuffle;Wide base of support Gait velocity: decreased Stairs: No    Exercises     PT Diagnosis: Abnormality of gait;Difficulty walking  PT Problem List: Decreased strength;Decreased activity tolerance;Decreased balance;Decreased mobility;Decreased coordination;Decreased cognition;Decreased knowledge of use of DME;Decreased safety awareness PT Treatment Interventions: DME instruction;Gait training;Functional mobility training;Stair training;Therapeutic activities;Therapeutic exercise;Balance training;Neuromuscular re-education;Cognitive remediation;Patient/family education     PT Goals(Current goals can be found in the care plan section) Acute Rehab PT Goals Patient Stated Goal: To get out of here.  There's nothin wrong with me.  PT Goal Formulation: With patient Time For Goal Achievement: 01/23/13 Potential to Achieve Goals: Good  Visit Information  Last PT Received On: 01/16/13 Assistance Needed: +1 History of Present Illness: 57 yo M with previous R MCA stroke and new onset seizures presenting as status epilepticus. He has had good recovery of his strength on the left, but still is very unsteady when stood.        Prior Corinne expects to be discharged to:: Private residence Living Arrangements: Other (Comment) (mother (75 y/o) in good health) Available Help at Discharge: Family Type of Home: House Home Access: Stairs to enter [employer] of  Steps: 3 Entrance Stairs-Rails: Left Home Layout: One  level Home Equipment: Cane - single point Prior Function Level of Independence: Independent Communication Communication: No difficulties Dominant Hand: Right    Cognition  Cognition Arousal/Alertness: Awake/alert Behavior During Therapy: Impulsive Overall Cognitive Status: Impaired/Different from baseline Area of Impairment: Attention;Following commands;Safety/judgement;Awareness;Problem solving Current Attention Level: Sustained Following Commands: Follows one step commands with increased time Safety/Judgement: Decreased awareness of safety Awareness: Intellectual Problem Solving: Difficulty sequencing;Requires verbal cues General Comments: Pt impulsive and has no recognition of deficits.  Pt stated "I can walk and talk I'm not sure why I'm still here."    Extremity/Trunk Assessment Lower Extremity Assessment Lower Extremity Assessment: LLE deficits/detail LLE Deficits / Details: 4/5 gross LLE Coordination: decreased gross motor   Balance Balance Balance Assessed: Yes Static Standing Balance Static Standing - Balance Support: No upper extremity supported Static Standing - Level of Assistance: 3: Mod assist;5: Stand by assistance Static Standing - Comment/# of Minutes: Initial min/mod (A) to maintian balance due to posterior lean and relying LE against bed to maintain balance.    End of Session PT - End of Session Equipment Utilized During Treatment: Gait belt Activity Tolerance: Patient tolerated treatment well Patient left: in bed;with call bell/phone within reach;with bed alarm set Nurse Communication: Mobility status  GP     Brian [name] 01/16/2013, 11:42 AM  Antoine Poche, PT DPT [phone]

## 2013-01-16 NOTE — Progress Notes (Signed)
Rehab Admissions Coordinator Note:  Patient was screened by Retta Diones for appropriateness for an Inpatient Acute Rehab Consult.  At this time, we are recommending [facility] or SNF if patient prefers.  It is very unlikely that I could get authorization from [facility] given current diagnosis.  Call me for questions.  Retta Diones 01/16/2013, 1:28 PM  I can be reached at [phone].

## 2013-01-16 NOTE — Progress Notes (Signed)
Utilization review completed.  

## 2013-01-16 NOTE — Progress Notes (Addendum)
[facility] Progress Note [place] TEAM 1 - Stepdown/ICU TEAM   Brian [name] [id number] DOB: 1956/01/16 DOA: 01/15/2013 PCP: Odette Fraction, MD  Brief narrative: This is a 57 year old male with a past medical history of insulin requiring diabetes mellitus, hypertension, CVA causing some mild left-sided weakness, hyperlipidemia. The patient presented to the ER with sudden left-sided weakness followed by tonic-clonic seizure which was seen in the ER. This resolved after IV Ativan. A neuro consult was requested and the patient was bolused with Dilantin IV. Apparently initial history obtained mentions that the patient has a seizure disorder but patient and daughter who is at bedside today confirmed that this was the first seizure that he's ever had in his life.  Assessment/Plan: Principal Problem:   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 status epilepticus, not intractable -Patient was initially loaded with Dilantin and has been switched to oral Keppra. He will be discharged on this with outpatient followup with neurology. -CT head reveals stable old infarcts and chronic small vessel disease No further studies are recommended by neurology. -Currently no neurological deficits are present  Gait disturbance -As he is unsteady on his feet this morning will request a physical therapy evaluation -Will also check a B12 level  Atrial fibrillation -Currently sinus rhythm - The patient was previously on Pradaxa the however was not able to afford it and therefore was switched over to Coumadin. I will see if he is able to afford Xarelto with the factors that we have in our hospital. Have consult with social services    DIABETES MELLITUS, TYPE II, UNCONTROLLED -Unknown doses of insulin -Will continue sliding scale until insulin dosages can be confirmed by the pharmacy    Essential hypertension, benign -Continue current antihypertensives    CONGESTIVE HEART FAILURE, SYSTOLIC DYSFUNCTION -Currently  stable-will DC IV fluids as he is eating and drinking well  History of CVA-right parietal -Patient states that he had no neurological deficits from this infarct  ARF - mild and resolved  Hypokalemia -replace  Code Status: Full code Family Communication: With daughter at bedside Disposition Plan: Transfer to telemetry and camera room  Consultants: Neurology  Procedures: None  Antibiotics: None  DVT prophylaxis:  Coumadin  HPI/Subjective: Patient alert and states that he is not having any problems and would like to go home. Daughter is present in the room and when questioned about previous seizures she states that she was confused before when she stated that he had a seizure in the past. In actuality he has had only a stroke and never had seizures.   Objective: Blood pressure 128/96, pulse 77, temperature 97.7 F (36.5 C), temperature source Oral, resp. rate 25, height 5\' 10"  (1.778 m), weight 95.8 kg (211 lb 3.2 oz), SpO2 95.00%.  Intake/Output Summary (Last 24 hours) at 01/16/13 1555 Last data filed at 01/16/13 1531  Gross per 24 hour  Intake    440 ml  Output   2775 ml  Net  -2335 ml     Exam: General: No acute respiratory distress Lungs: Clear to auscultation bilaterally without wheezes or crackles Cardiovascular: regular rate and rhythm without murmur gallop or rub normal S1 and S2 Abdomen: Nontender, nondistended, soft, bowel sounds positive, no rebound, no ascites, no appreciable mass Extremities: No significant cyanosis, clubbing, or edema bilateral lower extremities  Data Reviewed: Basic Metabolic Panel:  Recent Labs Lab 01/15/13 1743 01/15/13 1759 01/16/13 1230  NA 135 140 134*  K 4.0 3.4* 3.4*  CL 98 107 101  CO2 16*  --  23  GLUCOSE 91 92 103*  BUN 24* 25* 18  CREATININE 1.92* 2.30* 1.34  CALCIUM 9.5  --  8.5   Liver Function Tests:  Recent Labs Lab 01/15/13 1743  AST 30  ALT 37  ALKPHOS 100  BILITOT 0.3  PROT 8.0  ALBUMIN 4.2    No results found for this basename: LIPASE, AMYLASE,  in the last 168 hours No results found for this basename: AMMONIA,  in the last 168 hours CBC:  Recent Labs Lab 01/15/13 1743 01/15/13 1759 01/16/13 1230  WBC 12.7*  --  8.4  NEUTROABS 6.7  --   --   HGB 16.5 16.7 14.6  HCT 47.5 49.0 40.8  MCV 83.9  --  84.1  PLT 268  --  206   Cardiac Enzymes:  Recent Labs Lab 01/15/13 1743 01/15/13 2145 01/16/13 0500 01/16/13 0905 01/16/13 1230  CKTOTAL  --   --   --   --  83  TROPONINI <0.30 <0.30 <0.30 <0.30  --    BNP (last 3 results) No results found for this basename: PROBNP,  in the last 8760 hours CBG:  Recent Labs Lab 01/15/13 2302 01/16/13 0450 01/16/13 0534 01/16/13 0741 01/16/13 1301  GLUCAP 98 68* 72 65* 119*    Recent Results (from the past 240 hour(s))  MRSA PCR SCREENING     Status: None   Collection Time    01/16/13  2:44 AM      Result Value Range Status   MRSA by PCR NEGATIVE  NEGATIVE Final   Comment:            The GeneXpert MRSA Assay (FDA     approved for NASAL specimens     only), is one component of a     comprehensive MRSA colonization     surveillance program. It is not     intended to diagnose MRSA     infection nor to guide or     monitor treatment for     MRSA infections.     Studies:  Recent x-ray studies have been reviewed in detail by the Attending Physician  Scheduled Meds:  Scheduled Meds: . amLODipine  10 mg Oral Daily  . carvedilol  25 mg Oral BID WC  . hydrALAZINE  50 mg Oral BID  . insulin aspart  0-15 Units Subcutaneous Q4H  . levETIRAcetam  250 mg Oral BID  . sodium chloride  3 mL Intravenous Q12H  . warfarin  10 mg Oral ONCE-1800  . Warfarin - Pharmacist Dosing Inpatient   Does not apply q1800   Continuous Infusions:   Time spent on care of this patient: 68 min   Debbe Odea, MD  [facility] Office  [phone] Pager - Text Page per Shea Evans as per below:  On-Call/Text Page:      [email or web address]       password [email or web address]  If 7PM-7AM, please contact night-coverage [email or web address] Password [email or web address] 01/16/2013, 3:55 PM   LOS: 1 day

## 2013-01-16 NOTE — Progress Notes (Signed)
Subjective: No further seizures  Exam: Filed Vitals:   01/16/13 0742  BP: 147/89  Pulse: 77  Temp: 97.9 F (36.6 C)  Resp:    Gen: In bed, NAD MS: Awake, Alert, interactive and appropriate oriented to month and year. Does not extinguish to tactile or visual stimuli, howereve when repositioning himself in the bed, has difficulty as with someone who has a mild neglect.  [id number], face appears symmetric.  Motor: 5/5 on right, 4+/5 on left with very mild drift Sensory:intact to LT Gait: very unsteady, unable to wlk.   Impression: 57 yo M with previous R MCA stroke and new onset seizures presenting as status epilepticus. He has had good recovery of his strength on the left, but still is very unsteady when stood. I suspect that this is continuing post-ictal state given that he had such a Prolonged seizure, but would be ebneficial to have PT evaluate for gait safety as well.   I loaded him with dilantin in the ER, however with warfarin use I would favor changing to keppra as the difficult pharmacokinetics of dilantin and warfarin co-administration can be problematic.   Recommendations: 1) No need for EEG as I do not think it would change management.  2) Keppra 250mg  BID x 1 week, then 500mg  BID 3) PT eval 4) will continue to follow  [name], MD [facility] [phone]  If 7pm- 7am, please page neurology on call at [phone].

## 2013-01-16 NOTE — Clinical Social Work Psychosocial (Signed)
Clinical Social Work Department BRIEF PSYCHOSOCIAL ASSESSMENT 01/16/2013  Patient:  Brian [name], Brian [name]     Account Number:  [id number]     Admit date:  01/15/2013  Clinical Social Worker:  Lovey Newcomer  Date/Time:  01/16/2013 03:45 PM  Referred by:  Physician  Date Referred:  01/16/2013 Referred for  SNF Placement   Other Referral:   Interview type:  Patient Other interview type:   Patient alert and oriented at time of assessment.    PSYCHOSOCIAL DATA Living Status:  PARENTS Admitted from facility:   Level of care:   Primary support name:  Brian [name] (daughter) [phone] or Brian [name] (mother) [phone] Primary support relationship to patient:  CHILD, ADULT Degree of support available:   Support is good.    CURRENT CONCERNS Current Concerns  Post-Acute Placement   Other Concerns:    SOCIAL WORK ASSESSMENT / PLAN CSW met with patient at bedside to discuss SNF backup plan to CIR. Patient is agreeable to SNF placement if CIR is not an option. Patient would want SNF in [facility]. Patient plans to return home with mother after rehab. Patient also has a daughter who was at bedside.   Assessment/plan status:  Psychosocial Support/Ongoing Assessment of Needs Other assessment/ plan:   Complete FL2, Fax, PASRR   Information/referral to community resources:   CSW contact information and SNF list given.    PATIENT'S/FAMILY'S RESPONSE TO PLAN OF CARE: Patient is agreeable to SNF placement if CIR is not an option. Patient was pleasant and appreciative of CSW contact. CSW awaits bed offers.       Liz Beach, [place], [place], [id number]

## 2013-01-17 ENCOUNTER — Inpatient Hospital Stay (HOSPITAL_COMMUNITY): Payer: Medicare Other

## 2013-01-17 DIAGNOSIS — Z7901 Long term (current) use of anticoagulants: Secondary | ICD-10-CM

## 2013-01-17 LAB — URINALYSIS, ROUTINE W REFLEX MICROSCOPIC
Bilirubin Urine: NEGATIVE
Glucose, UA: 250 mg/dL — AB
Leukocytes, UA: NEGATIVE
Nitrite: NEGATIVE
Protein, ur: NEGATIVE mg/dL
Specific Gravity, Urine: 1.018 (ref 1.005–1.030)
Urobilinogen, UA: 0.2 mg/dL (ref 0.0–1.0)

## 2013-01-17 LAB — RAPID URINE DRUG SCREEN, HOSP PERFORMED
Amphetamines: NOT DETECTED
Barbiturates: NOT DETECTED
Benzodiazepines: NOT DETECTED
Opiates: NOT DETECTED
Tetrahydrocannabinol: POSITIVE — AB

## 2013-01-17 LAB — BASIC METABOLIC PANEL
BUN: 16 mg/dL (ref 6–23)
CO2: 23 mEq/L (ref 19–32)
Calcium: 8.8 mg/dL (ref 8.4–10.5)
Chloride: 104 mEq/L (ref 96–112)
Creatinine, Ser: 1.4 mg/dL — ABNORMAL HIGH (ref 0.50–1.35)
Glucose, Bld: 110 mg/dL — ABNORMAL HIGH (ref 70–99)
Potassium: 3.8 mEq/L (ref 3.5–5.1)
Sodium: 138 mEq/L (ref 135–145)

## 2013-01-17 LAB — GLUCOSE, CAPILLARY
Glucose-Capillary: 108 mg/dL — ABNORMAL HIGH (ref 70–99)
Glucose-Capillary: 126 mg/dL — ABNORMAL HIGH (ref 70–99)
Glucose-Capillary: 140 mg/dL — ABNORMAL HIGH (ref 70–99)
Glucose-Capillary: 106 mg/dL — ABNORMAL HIGH (ref 70–99)
Glucose-Capillary: 161 mg/dL — ABNORMAL HIGH (ref 70–99)

## 2013-01-17 LAB — PROTIME-INR: INR: 2.76 — ABNORMAL HIGH (ref 0.00–1.49)

## 2013-01-17 MED ORDER — WARFARIN SODIUM 5 MG PO TABS
5.0000 mg | ORAL_TABLET | Freq: Once | ORAL | Status: AC
Start: 2013-01-17 — End: 2013-01-17
  Administered 2013-01-17: 5 mg via ORAL
  Filled 2013-01-17: qty 1

## 2013-01-17 MED ORDER — INSULIN ASPART 100 UNIT/ML ~~LOC~~ SOLN
0.0000 [IU] | Freq: Three times a day (TID) | SUBCUTANEOUS | Status: DC
  Administered 2013-01-18 (×2): 2 [IU] via SUBCUTANEOUS

## 2013-01-17 NOTE — Progress Notes (Signed)
ANTICOAGULATION CONSULT NOTE - Follow Up Consult  Pharmacy Consult for Coumadin Indication: atrial fibrillation  Allergies  Allergen Reactions  . Codeine Other (See Comments)    Unknown reaction    Patient Measurements: Height: 5\' 10"  (177.8 cm) Weight: 211 lb 3.2 oz (95.8 kg) IBW/kg (Calculated) : 73  Vital Signs: Temp: 99.1 F (37.3 C) (11/29 0358) Temp src: Oral (11/29 0358) BP: 139/97 mmHg (11/29 0358) Pulse Rate: 75 (11/29 0358)  Labs:  Recent Labs  01/15/13 1743 01/15/13 1759 01/15/13 2145 01/16/13 0500 01/16/13 0905 01/16/13 1230 01/17/13 0625  HGB 16.5 16.7  --   --   --  14.6  --   HCT 47.5 49.0  --   --   --  40.8  --   PLT 268  --   --   --   --  206  --   APTT 33  --   --   --   --   --   --   LABPROT 17.1*  --   --  19.7*  --   --  28.2*  INR 1.43  --   --  1.72*  --   --  2.76*  CREATININE 1.92* 2.30*  --   --   --  1.34 1.40*  CKTOTAL  --   --   --   --   --  83  --   TROPONINI <0.30  --  <0.30 <0.30 <0.30  --   --     Estimated Creatinine Clearance: 66 ml/min (by C-G formula based on Cr of 1.4).  Assessment: 57yom continues on coumadin for afib. INR therapeutic today with large increase.  CBC stable. Renal function improving. No bleeding reported.  Will give 5 mg today since large increase in INR and home dose of 5 mg on Friday given as 10 mg since subtherapeutic.   Home dose: 10mg  daily except 5 mg on Monday and Friday.   Goal of Therapy:  INR 2-3 Monitor platelets by anticoagulation protocol: Yes   Plan:  1) Coumadin 5mg  x 1 tonight 2) INR in AM  Thank you, Vivia Ewing, PharmD Clinical Pharmacist - Resident Pager: [phone] Pharmacy: [phone] 01/17/2013 11:09 AM

## 2013-01-17 NOTE — Progress Notes (Signed)
Physical Therapy Treatment Patient Details Name: Brian [name] MRN: [id number] DOB: 1956-02-01 Today's Date: 01/17/2013 Time: [id number] PT Time Calculation (min): 23 min  PT Assessment / Plan / Recommendation  History of Present Illness 57 yo M with previous R MCA stroke and new onset seizures presenting as status epilepticus. He has had good recovery of his strength on the left, but still is very unsteady when stood.    PT Comments   Pt showing much improvement however continues to have problem solving difficulties as well as impulsive.  Pt need cues for safety and cues to complete task.  Pt denied by inpatient rehab therefore highly recommend HHPT with 24 hour supervision.  Pt's functional mobility has improved however continues to have cognition deficits.    Follow Up Recommendations  Home health PT;Supervision/Assistance - 24 hour (Rehab denied)     Equipment Recommendations  Rolling walker with 5" wheels    Frequency Min 4X/week   Plan Discharge plan needs to be updated    Precautions / Restrictions Precautions Precautions: Fall   Pertinent Vitals/Pain No c/o pain    Mobility  Bed Mobility Bed Mobility: Supine to Sit;Sit to Supine Supine to Sit: 4: Min assist;HOB flat Sit to Supine: 4: Min guard;HOB flat Details for Bed Mobility Assistance: Minguard for safety with max cues for step by step instructions for safety.  Transfers Transfers: Sit to Stand;Stand to Sit Sit to Stand: From bed;5: Supervision Stand to Sit: To chair/3-in-1;5: Supervision Details for Transfer Assistance: Minguard for safety due to impulsive Ambulation/Gait Ambulation/Gait Assistance: 4: Min assist;4: Min guard Ambulation Distance (Feet): 200 Feet Assistive device: None Ambulation/Gait Assistance Details: Occasional min (A) to maintain balance; pt continues to veer to right side during ambulation.   Gait Pattern: Step-through pattern;Decreased stride length;Shuffle;Wide base of support Gait  velocity: decreased General Gait Details: lateral sway Stairs: Yes Stairs Assistance: 4: Min assist Stairs Assistance Details (indicate cue type and reason): Pt having difficulty following commands with stair negotiation and ambulated backwards downstairs.  Stair Management Technique: One rail Right;Step to pattern;Forwards;Backwards Number of Stairs: 3    Exercises     PT Diagnosis:    PT Problem List:   PT Treatment Interventions:     PT Goals (current goals can now be found in the care plan section) Acute Rehab PT Goals Patient Stated Goal: To go home PT Goal Formulation: With patient Time For Goal Achievement: 01/23/13 Potential to Achieve Goals: Good  Visit Information  Assistance Needed: +1 History of Present Illness: 57 yo M with previous R MCA stroke and new onset seizures presenting as status epilepticus. He has had good recovery of his strength on the left, but still is very unsteady when stood.     Subjective Data  Subjective: "I don't remember therapy yesterday." Patient Stated Goal: To go home   Cognition  Cognition Arousal/Alertness: Awake/alert Behavior During Therapy: Impulsive Overall Cognitive Status: Impaired/Different from baseline Area of Impairment: Attention;Following commands;Safety/judgement;Awareness;Problem solving;Orientation Orientation Level: Time (pt reports Feb but recognizes Thanksgiving was thrus) Current Attention Level: Selective Following Commands: Follows multi-step commands with increased time Safety/Judgement: Decreased awareness of safety Awareness: Emergent Problem Solving: Requires verbal cues;Slow processing;Difficulty sequencing General Comments: Pt with improvement on overall safety awareness however continues to be impulsive.  Pt with short term memory deficits.     Balance  Balance Balance Assessed: Yes Static Standing Balance Static Standing - Balance Support: No upper extremity supported Static Standing - Level of  Assistance: 5: Stand by assistance Standardized Balance  Assessment Standardized Balance Assessment: Dynamic Gait Index Dynamic Gait Index Level Surface: Normal Change in Gait Speed: Mild Impairment Gait with Horizontal Head Turns: Mild Impairment Gait with Vertical Head Turns: Mild Impairment Gait and Pivot Turn: Normal Step Over Obstacle: Mild Impairment Step Around Obstacles: Normal Steps: Moderate Impairment Total Score: 18  End of Session PT - End of Session Equipment Utilized During Treatment: Gait belt Activity Tolerance: Patient tolerated treatment well Patient left: in bed;with call bell/phone within reach;with bed alarm set Nurse Communication: Mobility status   GP     Ahkeem Goede 01/17/2013, 12:27 PM  Antoine Poche, [place] DPT [phone]

## 2013-01-17 NOTE — Plan of Care (Signed)
Problem: Phase I Progression Outcomes Goal: OOB as tolerated unless otherwise ordered Outcome: Completed/Met Date Met:  01/17/13 Unsteady gait

## 2013-01-17 NOTE — Progress Notes (Addendum)
[facility] Progress Note [place] TEAM 1 - Stepdown/ICU TEAM   Brian [name] [id number] DOB: 01/13/1956 DOA: 01/15/2013 PCP: Odette Fraction, MD  Brief narrative: This is a 57 year old male with a past medical history of insulin requiring diabetes mellitus, hypertension, CVA causing some mild left-sided weakness, hyperlipidemia. The patient presented to the ER with sudden left-sided weakness followed by tonic-clonic seizure which was seen in the ER. This resolved after IV Ativan. A neuro consult was requested and the patient was bolused with Dilantin IV. Apparently initial history obtained mentions that the patient has a seizure disorder but patient and daughter who is at bedside today confirmed that this was the first seizure that he's ever had in his life.  Assessment/Plan: Principal Problem:   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 status epilepticus, not intractable -Patient was initially loaded with Dilantin and has been switched to oral Keppra. He will be discharged on this with outpatient followup with neurology. -CT head reveals stable old infarcts and chronic small vessel disease   Gait disturbance -continues to be unsteady on his feet - MRI ordered by neuro today - physical therapy evaluation- recommended 24 hr care- friend states he will take pt home with him until more stable -Will also check a B12 level  Atrial fibrillation -Currently sinus rhythm - The patient was previously on Pradaxa but was not able to afford it and therefore was switched over to Coumadin. I will see if he is able to afford Xarelto with the coupons that we have in our hospital. - Have consulted social services    DIABETES MELLITUS, TYPE II, UNCONTROLLED -pharmacy has completed med rec and it appears he is on exteremly high doses of insulin  - interestingly we have had him on a moderate dose sliding scale and sugars have been quite stable -Per his concerned friend, pt does not check his sugars prior to  administering insulin as outpt - will need home health f/u    Essential hypertension, benign -Continue current antihypertensives    CONGESTIVE HEART FAILURE, SYSTOLIC DYSFUNCTION -Currently stable-will DC IV fluids as he is eating and drinking well  History of CVA-right parietal -Patient states that he had no neurological deficits from this infarct  ARF vs CKD 3 - follow  Hypokalemia -replace  Code Status: Full code Family Communication: With mother and close friend at bedside Disposition Plan: Transfer to telemetry and camera room  Consultants: Neurology  Procedures: None  Antibiotics: None  DVT prophylaxis:  Coumadin  HPI/Subjective: Patient alert and sitting in a chair- agrees today that he is having balance trouble which started on Thursday after the seizure. Friend states he will take him home to live with him if balance dose not improve. Friend takes me outside of the room to tell me that he thinks pt takes too much insulin and did not check his sugars on thanksgiving day.  He is concerned that hypoglycemia may have caused the seizure.   Objective: Blood pressure 139/97, pulse 75, temperature 99.1 F (37.3 C), temperature source Oral, resp. rate 18, height 5\' 10"  (1.778 m), weight 95.8 kg (211 lb 3.2 oz), SpO2 92.00%.  Intake/Output Summary (Last 24 hours) at 01/17/13 1429 Last data filed at 01/17/13 0900  Gross per 24 hour  Intake    243 ml  Output   1075 ml  Net   -832 ml     Exam: General: No acute respiratory distress Lungs: Clear to auscultation bilaterally without wheezes or crackles Cardiovascular: regular rate and rhythm without murmur gallop or  rub normal S1 and S2 Abdomen: Nontender, nondistended, soft, bowel sounds positive, no rebound, no ascites, no appreciable mass Extremities: No significant cyanosis, clubbing, or edema bilateral lower extremities  Data Reviewed: Basic Metabolic Panel:  Recent Labs Lab 01/15/13 1743 01/15/13 1759  01/16/13 1230 01/17/13 0625  NA 135 140 134* 138  K 4.0 3.4* 3.4* 3.8  CL 98 107 101 104  CO2 16*  --  23 23  GLUCOSE 91 92 103* 110*  BUN 24* 25* 18 16  CREATININE 1.92* 2.30* 1.34 1.40*  CALCIUM 9.5  --  8.5 8.8   Liver Function Tests:  Recent Labs Lab 01/15/13 1743  AST 30  ALT 37  ALKPHOS 100  BILITOT 0.3  PROT 8.0  ALBUMIN 4.2   No results found for this basename: LIPASE, AMYLASE,  in the last 168 hours No results found for this basename: AMMONIA,  in the last 168 hours CBC:  Recent Labs Lab 01/15/13 1743 01/15/13 1759 01/16/13 1230  WBC 12.7*  --  8.4  NEUTROABS 6.7  --   --   HGB 16.5 16.7 14.6  HCT 47.5 49.0 40.8  MCV 83.9  --  84.1  PLT 268  --  206   Cardiac Enzymes:  Recent Labs Lab 01/15/13 1743 01/15/13 2145 01/16/13 0500 01/16/13 0905 01/16/13 1230  CKTOTAL  --   --   --   --  83  TROPONINI <0.30 <0.30 <0.30 <0.30  --    BNP (last 3 results) No results found for this basename: PROBNP,  in the last 8760 hours CBG:  Recent Labs Lab 01/16/13 2035 01/17/13 0027 01/17/13 0358 01/17/13 0816 01/17/13 1211  GLUCAP 171* 84 108* 126* 140*    Recent Results (from the past 240 hour(s))  MRSA PCR SCREENING     Status: None   Collection Time    01/16/13  2:44 AM      Result Value Range Status   MRSA by PCR NEGATIVE  NEGATIVE Final   Comment:            The GeneXpert MRSA Assay (FDA     approved for NASAL specimens     only), is one component of a     comprehensive MRSA colonization     surveillance program. It is not     intended to diagnose MRSA     infection nor to guide or     monitor treatment for     MRSA infections.     Studies:  Recent x-ray studies have been reviewed in detail by the Attending Physician  Scheduled Meds:  Scheduled Meds: . amLODipine  10 mg Oral Daily  . carvedilol  25 mg Oral BID WC  . hydrALAZINE  50 mg Oral BID  . insulin aspart  0-15 Units Subcutaneous Q4H  . levETIRAcetam  250 mg Oral BID  .  sodium chloride  3 mL Intravenous Q12H  . warfarin  5 mg Oral ONCE-1800  . Warfarin - Pharmacist Dosing Inpatient   Does not apply q1800   Continuous Infusions:   Time spent on care of this patient: 68 min   Debbe Odea, MD  [facility] Office  [phone] Pager - Text Page per Shea Evans as per below:  On-Call/Text Page:      [email or web address]      password [email or web address]  If 7PM-7AM, please contact night-coverage [email or web address] Password [email or web address] 01/17/2013, 2:29 PM   LOS: 2 days

## 2013-01-17 NOTE — Progress Notes (Addendum)
Subjective: No further seizures, but continues to be a little unsteady  Exam: Filed Vitals:   01/17/13 0358  BP: 139/97  Pulse: 75  Temp: 99.1 F (37.3 C)  Resp: 18   Gen: In bed, NAD MS: Awake, Alert, interactive and appropriate oriented to month and year. Does not extinguish to tactile or visual stimuli.  [id number], face appears symmetric.  Motor: 5/5 on right, 4+/5 on left with very mild drift Sensory:intact to LT Gait: contineus to have some unsteadiness.   Impression: 57 yo M with previous R MCA stroke and new onset seizures presenting as status epilepticus. He has had good recovery of his strength on the left, but still is very unsteady when stood. Though conitnued post-ictal state is possible, he was subtheraputic on coumadin on arrival and could have had a small stroke.   I loaded him with dilantin in the ER, however with warfarin use I would favor changing to keppra as the difficult pharmacokinetics of dilantin and warfarin co-administration can be problematic.   Recommendations: 1) MRI brain given persistent unsteadiness.  2) Keppra 250mg  BID x 1 week, then 500mg  BID 3) will continue to follow  [name], MD [facility] [phone]  If 7pm- 7am, please page neurology on call at [phone].

## 2013-01-18 DIAGNOSIS — G40901 Epilepsy, unspecified, not intractable, with status epilepticus: Secondary | ICD-10-CM | POA: Diagnosis present

## 2013-01-18 DIAGNOSIS — I5020 Unspecified systolic (congestive) heart failure: Secondary | ICD-10-CM

## 2013-01-18 DIAGNOSIS — I6350 Cerebral infarction due to unspecified occlusion or stenosis of unspecified cerebral artery: Secondary | ICD-10-CM

## 2013-01-18 LAB — CBC
Hemoglobin: 14.9 g/dL (ref 13.0–17.0)
MCH: 28.7 pg (ref 26.0–34.0)
MCHC: 34.2 g/dL (ref 30.0–36.0)
Platelets: 205 10*3/uL (ref 150–400)
RDW: 13.2 % (ref 11.5–15.5)
WBC: 6.6 10*3/uL (ref 4.0–10.5)

## 2013-01-18 LAB — PROTIME-INR
INR: 2.89 — ABNORMAL HIGH (ref 0.00–1.49)
Prothrombin Time: 29.2 seconds — ABNORMAL HIGH (ref 11.6–15.2)

## 2013-01-18 LAB — VITAMIN B12: Vitamin B-12: 362 pg/mL (ref 211–911)

## 2013-01-18 LAB — GLUCOSE, CAPILLARY
Glucose-Capillary: 125 mg/dL — ABNORMAL HIGH (ref 70–99)
Glucose-Capillary: 135 mg/dL — ABNORMAL HIGH (ref 70–99)

## 2013-01-18 MED ORDER — INSULIN REGULAR HUMAN 100 UNIT/ML IJ SOLN: 30.0000 [IU] | Freq: Every day | INTRAMUSCULAR | Status: DC

## 2013-01-18 MED ORDER — HYDRALAZINE HCL 50 MG PO TABS: 50.0000 mg | ORAL_TABLET | Freq: Three times a day (TID) | ORAL | Status: DC

## 2013-01-18 MED ORDER — INSULIN GLARGINE 100 UNIT/ML SOLOSTAR PEN: 45.0000 [IU] | PEN_INJECTOR | Freq: Two times a day (BID) | SUBCUTANEOUS | Status: DC

## 2013-01-18 MED ORDER — LEVETIRACETAM 250 MG PO TABS: 250.0000 mg | ORAL_TABLET | Freq: Two times a day (BID) | ORAL | Status: DC

## 2013-01-18 MED ORDER — WARFARIN SODIUM 7.5 MG PO TABS
7.5000 mg | ORAL_TABLET | Freq: Once | ORAL | Status: DC
  Filled 2013-01-18: qty 1

## 2013-01-18 NOTE — Progress Notes (Signed)
Subjective: Doing better today.   Exam: Filed Vitals:   01/18/13 0825  BP: 151/92  Pulse: 74  Temp:   Resp:    Gen: In bed, NAD MS: Awake, Alert, oriented PA:[id number], VFF Motor: 5/5 on right, very mild weakness on left.  Sensory:intact to LT   Impression: 57 yo M with previous R MCA stroke and new onset seizures presenting as status epilepticus. He has had good recovery of his strength on the left, but still is very unsteady when stood. Though conitnued post-ictal state is possible, he was subtheraputic on coumadin on arrival and could have had a small stroke.   Recommendations: 1) Continue Keppra 500mg  BID 2) Patient is unable to drive, operate heavy machinery, perform activities at heights or participate in water activities until release by outpatient physician. This was discussed with the patient who expressed understanding.  3) Patient can call to schedule follow up with [place], [place], [name] [address]--[phone] or  [facility] neurology [address] [place], [place] [address] [phone] 4) No further recommendations from neurology at this time. Please call with any further questions.     [name], MD [facility] [phone]  If 7pm- 7am, please page neurology on call at [phone].

## 2013-01-18 NOTE — Progress Notes (Signed)
CSW met with patient today to provide SNF bed offers. Patient has been referred for CIR vs SNF.  He states that he feels he is doing better and has decided to go home with his friend [name] and will be able to assist him as needed. He states that [facility]ona Huh can provide 24 hour care.  He agrees to [facility] if indicated by MD.  Does not have a preference for agency- just requests agency that accepts his insurance. Patient states that he has a cane and denies need for further DME.Marland Kitchen  Discussed with Judson Roch- weekend RNCM. She was already aware of patient and has arranged for [facility] with [facility]. Patient denied any further needs. CSW signing off. Lorie Phenix. [place], [place]

## 2013-01-18 NOTE — Progress Notes (Signed)
ANTICOAGULATION CONSULT NOTE - Follow Up Consult  Pharmacy Consult for Coumadin Indication: atrial fibrillation  Allergies  Allergen Reactions  . Codeine Other (See Comments)    Unknown reaction    Patient Measurements: Height: 5\' 10"  (177.8 cm) Weight: 202 lb 4.8 oz (91.763 kg) IBW/kg (Calculated) : 73  Vital Signs: Temp: 98 F (36.7 C) (11/30 0948) BP: 153/92 mmHg (11/30 0948) Pulse Rate: 71 (11/30 0948)  Labs:  Recent Labs  01/15/13 1743 01/15/13 1759 01/15/13 2145 01/16/13 0500 01/16/13 0905 01/16/13 1230 01/17/13 0625 01/18/13 0516  HGB 16.5 16.7  --   --   --  14.6  --  14.9  HCT 47.5 49.0  --   --   --  40.8  --  43.6  PLT 268  --   --   --   --  206  --  205  APTT 33  --   --   --   --   --   --   --   LABPROT 17.1*  --   --  19.7*  --   --  28.2* 29.2*  INR 1.43  --   --  1.72*  --   --  2.76* 2.89*  CREATININE 1.92* 2.30*  --   --   --  1.34 1.40*  --   CKTOTAL  --   --   --   --   --  83  --   --   TROPONINI <0.30  --  <0.30 <0.30 <0.30  --   --   --     Estimated Creatinine Clearance: 64.7 ml/min (by C-G formula based on Cr of 1.4).  Assessment: 57yom continues on coumadin for afib. INR therapeutic today. CBC stable. No bleeding reported.  Will give 7.5 mg today (normal home dose 10mg , but on the high end of therapeutic range).   Home dose: 10mg  daily except 5 mg on Monday and Friday.   Goal of Therapy:  INR 2-3 Monitor platelets by anticoagulation protocol: Yes   Plan:  1) Coumadin 7.5mg  x 1 tonight 2) INR in AM  Thank you, Vivia Ewing, PharmD Clinical Pharmacist - Resident Pager: [phone] Pharmacy: [phone] 01/18/2013 11:16 AM

## 2013-01-18 NOTE — Discharge Summary (Signed)
Physician Discharge Summary  Patient ID: Brian [name] MRN: [id number] DOB/AGE: Mar 18, 1955 57 y.o.  Admit date: 01/15/2013 Discharge date: 01/18/2013  Primary Care Physician:  Odette Fraction, MD  Final Discharge Diagnoses:   . Status epilepticus  Secondary discharge diagnosis . CONGESTIVE HEART FAILURE, SYSTOLIC DYSFUNCTION . DIABETES MELLITUS, TYPE II, UNCONTROLLED . Essential hypertension, benign . HYPERLIPIDEMIA . Status epilepticus Atrial fibrillation Gait disturbance  Consults: Neurology   Recommendations for Outpatient Follow-up:  1. patient was recommended to call neurology outpatient office number to make an appointment. 2.  Patient is unable to drive, operate heavy machinery, perform activities at heights or participate in water activities until release by outpatient physician or neurology 3 please follow his insulin regimen, patient appears to have some confusion regarding the insulin.   Allergies:   Allergies  Allergen Reactions  . Codeine Other (See Comments)    Unknown reaction     Discharge Medications:   Medication List         amLODipine 10 MG tablet  Commonly known as:  NORVASC  Take 10 mg by mouth daily.     carvedilol 25 MG tablet  Commonly known as:  COREG  Take 25 mg by mouth 2 (two) times daily with a meal.     hydrALAZINE 50 MG tablet  Commonly known as:  APRESOLINE  Take 1 tablet (50 mg total) by mouth 3 (three) times daily.     Insulin Glargine 100 UNIT/ML Sopn  Commonly known as:  LANTUS SOLOSTAR  Inject 45-70 Units into the skin 2 (two) times daily. Inject 70 units daily with breakfast and 40 units daily at bedtime.     insulin regular 100 units/mL injection  Commonly known as:  HUMULIN R  Inject 0.3 mLs (30 Units total) into the skin daily with supper. Inject 30 units at supper time.     INSULIN SYRINGE .3CC/31GX5/16" 31G X 5/16" 0.3 ML Misc  Use once daily to inject insulin.     Insulin Syringes (Disposable) U-100 1  ML Misc  by Does not apply route. ReliOn insulin syringes  Capacity - 3/63ml - gauge - 30 - 67mm     levETIRAcetam 250 MG tablet  Commonly known as:  KEPPRA  Take 1 tablet (250 mg total) by mouth 2 (two) times daily.     ONE TOUCH ULTRA TEST test strip  Generic drug:  glucose blood     warfarin 10 MG tablet  Commonly known as:  COUMADIN  Take 10 mg by mouth daily.         Brief H and P: For complete details please refer to admission H and P, but in brief Brian [name] is an 57 y.o. male with hx of DM, HTN, recent CVA, hyperlipidemia, and hx of seizure (still drives and on no ACT) presents to the ER as a code stroke as he was having sudden increase left upper extremity weakness tics around 5pm, followed by a tonic clonic seizure seen in the ER. He was given IV ativan, and seen by Neurology Dr Leonel Ramsay as a code stroke. He had a period when he was able to converse, however per the admitting doctor he was sedated at the time. Dilantin at 20mg  per kg was given as a bolus. Per daughter, he is a stoic man, had a stroke with left upper extremity weakness residual, but highly functional. He lives with his mother.  Hospital Course:  This is a 57 year old male with a past medical history of insulin requiring  diabetes mellitus, hypertension, CVA causing some mild left-sided weakness, hyperlipidemia. The patient presented to the ER with sudden left-sided weakness followed by tonic-clonic seizure which was seen in the ER. This resolved after IV Ativan. A neuro consult was requested and the patient was bolused with Dilantin IV. Marland Kitchen   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 status epilepticus, not intractable: Patient was initially loaded with Dilantin and has been switched to oral Keppra. He will be discharged on this with outpatient followup with neurology. CT head revealed stable old infarcts and chronic small vessel disease.  MRI of the brain was done which showed no acute intracranial abnormality, advanced but stable  chronic ischemic disease, moderate sized 2011 history right MCA infarct with some associated chronic hemosiderin.   Gait disturbance patient was seen by physical therapy, noticed some gait disturbance however impulsive. He was recommended home health PT, he was also recommended 24-hour supervision. Home health physical therapy, on and followup were arranged at his friend's house as patient was going to stay at his friend's house for some time. MRI was negative for any acute stroke.  Atrial fibrillation  -Currently sinus rhythm  - The patient was previously on Pradaxa but was not able to afford it and therefore was switched over to Coumadin. He will continue Coumadin for now. INR is therapeutic at 2.89.  DIABETES MELLITUS, TYPE II, UNCONTROLLED  There was significant confusion at the time of admission regarding his insulin. I was able to confirm that the patient, and his daughter, Brian [name] and reviewing his primary care physician's notes. He is supposed to be on levemir (or lantus) 70 units in the morning and 40 units at bedtime. Plus he is also on Humulin R 30 units at supper. I have corrected it on the dc paperwork. Patient has an appointment with primary care physician on 01/22/2013, I explained to the patient and his daughter to discuss the insulin regimen at appointment.  Essential hypertension, benign  -Continue current antihypertensives   CONGESTIVE HEART FAILURE, SYSTOLIC DYSFUNCTION currently stable and compensated-  History of CVA-right parietal : May have been the likely cause of the seizures at the time of admission  ARF vs CKD 3 : Currently stable his baseline creatinine is around 1.9-2.3 . Currently 1.4 at the discharge.      Day of Discharge BP 153/92  Pulse 71  Temp(Src) 98 F (36.7 C) (Oral)  Resp 18  Ht 5\' 10"  (1.778 m)  Wt 91.763 kg (202 lb 4.8 oz)  BMI 29.03 kg/m2  SpO2 94%  Physical Exam: General: Alert and awake oriented x3 not in any acute distress. CVS:  S1-S2 clear no murmur rubs or gallops Chest: clear to auscultation bilaterally, no wheezing rales or rhonchi Abdomen: soft nontender, nondistended, normal bowel sounds Extremities: no cyanosis, clubbing or edema noted bilaterally Neuro: Cranial nerves II-XII intact, very mild weakness on the left    The results of significant diagnostics from this hospitalization (including imaging, microbiology, ancillary and laboratory) are listed below for reference.    LAB RESULTS: Basic Metabolic Panel:  Recent Labs Lab 01/16/13 1230 01/17/13 0625  NA 134* 138  K 3.4* 3.8  CL 101 104  CO2 23 23  GLUCOSE 103* 110*  BUN 18 16  CREATININE 1.34 1.40*  CALCIUM 8.5 8.8   Liver Function Tests:  Recent Labs Lab 01/15/13 1743  AST 30  ALT 37  ALKPHOS 100  BILITOT 0.3  PROT 8.0  ALBUMIN 4.2   No results found for this basename: LIPASE, AMYLASE,  in the  last 168 hours No results found for this basename: AMMONIA,  in the last 168 hours CBC:  Recent Labs Lab 01/15/13 1743  01/16/13 1230 01/18/13 0516  WBC 12.7*  --  8.4 6.6  NEUTROABS 6.7  --   --   --   HGB 16.5  < > 14.6 14.9  HCT 47.5  < > 40.8 43.6  MCV 83.9  --  84.1 83.8  PLT 268  --  206 205  < > = values in this interval not displayed. Cardiac Enzymes:  Recent Labs Lab 01/16/13 0500 01/16/13 0905 01/16/13 1230  CKTOTAL  --   --  54  TROPONINI <0.30 <0.30  --    BNP: No components found with this basename: POCBNP,  CBG:  Recent Labs Lab 01/18/13 0726 01/18/13 1117  GLUCAP 125* 135*    Significant Diagnostic Studies:  Ct Head Wo Contrast  01/15/2013   CLINICAL DATA:  Left leg weakness. Seizure activity. Previous strokes.  EXAM: CT HEAD WITHOUT CONTRAST  TECHNIQUE: Contiguous axial images were obtained from the base of the skull through the vertex without intravenous contrast.  COMPARISON:  09/20/2010  FINDINGS: There is no evidence of intracranial hemorrhage, brain edema, or other signs of acute infarction.  There is no evidence of intracranial mass lesion or mass effect. No abnormal extraaxial fluid collections are identified.  Old right parietal lobe infarct is again seen. Moderate chronic small vessel disease is unchanged in appearance. Old left basal ganglia lacune and adjacent deep white matter infarct also unchanged in appearance. Ventricles are stable in size. No skull abnormality identified.  IMPRESSION: No acute intracranial findings.  Stable old infarcts and chronic small vessel disease, as described above.   Electronically Signed   By: Earle Gell M.D.   On: 01/15/2013 18:09      Disposition and Follow-up: Discharge Orders   Future Appointments Provider Department Dept Phone   01/22/2013 10:15 AM [name], MD [facility] [phone]   Future Orders Complete By Expires   Diet Carb Modified  As directed    Increase activity slowly  As directed        DISPOSITION:Home   DIET: Carb modified  ACTIVITY: As tolerated  TESTS THAT NEED FOLLOW-UP BMET, follow renal function and PT/INR  DISCHARGE FOLLOW-UP Follow-up Information   Follow up with [facility]. (home health Physical and occupational therapy and nurse)    Contact information:   [address] [place] [address] [phone]       Follow up with Odette Fraction, MD On 01/22/2013. (at 10:15AM. Please discuss about your insulin regimen. )    Specialty:  Family Medicine   Contact information:   [address] [place] [address] [phone]       Follow up with Bond[place]ant, Donna, DO. Schedule an appointment as soon as possible for a visit in 2 weeks. (please call their office to make appointment )    Specialty:  Neurology   Contact information:   [place] [address] [phone]       Time spent on Discharge: 45 mins  Signed:   RAI,RIPUDEEP M.D. [facility] 01/18/2013, 1:15 PM Pager: [phone]

## 2013-01-18 NOTE — Progress Notes (Signed)
   CARE MANAGEMENT NOTE 01/18/2013  Patient:  Brian [name], Brian [name]   Account Number:  [id number]  Date Initiated:  01/16/2013  Documentation initiated by:  Marvetta Gibbons  Subjective/Objective Assessment:   Pt admitted with AMS, ?stroke, seizures     Action/Plan:   PTA pt lived at home with mother- NCM to follow for d/c needs- PT eval ordered   Anticipated DC Date:  01/18/2013   Anticipated DC Plan:        DC Planning Services  CM consult      [facility] Choice  HOME HEALTH   Choice offered to / List presented to:  C-1 Patient        [place] arranged  HH-1 RN  HH-2 PT  HH-3 OT      Status of service:  Completed, signed off Medicare Important Message given?   (If response is "NO", the following Medicare IM given date fields will be blank) Date Medicare IM given:   Date Additional Medicare IM given:    Discharge Disposition:  [place]  Per UR Regulation:  Reviewed for med. necessity/level of care/duration of stay  If discussed at [place] of Stay Meetings, dates discussed:    Comments:  01/18/13 11:15 MD called to arrange [name] for pt to be discharged today.  CM met with pt in room to offer choice and to verify address and contact numbers.  Pt will be staying with Brian [name] and the address for [facility] services will be [address] [place], [place] [address] and Brian [name] phone is [phone].  Pt chooses [facility] for HHPT/OT/RN (drawing for INR) and to call results to PCP at [phone] on 01/22/13.  Referral faxed to [facility] with address of where pt will receive services.  No other CM needs were communicated.  Mariane Masters, BSN, CM [phone].

## 2013-01-18 NOTE — Care Management Note (Signed)
    Page 1 of 2   01/18/2013     1:38:58 PM   CARE MANAGEMENT NOTE 01/18/2013  Patient:  Brian [name], Brian [name]   Account Number:  [id number]  Date Initiated:  01/16/2013  Documentation initiated by:  Marvetta Gibbons  Subjective/Objective Assessment:   Pt admitted with AMS, ?stroke, seizures     Action/Plan:   PTA pt lived at home with mother- NCM to follow for d/c needs- PT eval ordered   Anticipated DC Date:  01/18/2013   Anticipated DC Plan:        [place]  CM consult      [facility] Choice  HOME HEALTH   Choice offered to / List presented to:  C-1 Patient   DME arranged  Vassie Moselle      DME agency  [place] arranged  HH-1 RN  HH-2 PT  HH-3 OT      Status of service:  Completed, signed off Medicare Important Message given?   (If response is "NO", the following Medicare IM given date fields will be blank) Date Medicare IM given:   Date Additional Medicare IM given:    Discharge Disposition:  [place]  Per UR Regulation:  Reviewed for med. necessity/level of care/duration of stay  If discussed at [place] of Stay Meetings, dates discussed:    Comments:  01/18/13 11:15 CM notes add-on order of rolling walker.  CM called DME to deliver to room prior to discharge.  No other CM needs communicated.  Mariane Masters, BSN, CM [phone].   MD called to arrange [facility] for pt to be discharged today.  CM met with pt in room to offer choice and to verify address and contact numbers.  Pt will be staying with [name] and the address for [facility] services will be [address] [place],  [address] and Simona Huh phone is [phone].  Pt chooses [facility] for HHPT/OT/RN (drawing for INR) and to call results to PCP at [phone] on 01/22/13. Referral faxed to [facility] with address of where pt will receive services.  No other CM needs were communicated.  Mariane Masters, BSN, CM [phone].

## 2013-01-22 ENCOUNTER — Ambulatory Visit (INDEPENDENT_AMBULATORY_CARE_PROVIDER_SITE_OTHER): Payer: Medicare Other | Admitting: Family Medicine

## 2013-01-22 ENCOUNTER — Encounter: Payer: Self-pay | Admitting: Family Medicine

## 2013-01-22 VITALS — BP 132/80 | HR 76 | Temp 97.4°F | Resp 18 | Ht 69.0 in | Wt 212.0 lb

## 2013-01-22 DIAGNOSIS — Z09 Encounter for follow-up examination after completed treatment for conditions other than malignant neoplasm: Secondary | ICD-10-CM

## 2013-01-22 DIAGNOSIS — I4891 Unspecified atrial fibrillation: Secondary | ICD-10-CM

## 2013-01-22 LAB — PT WITH INR/FINGERSTICK: INR, fingerstick: 3 — ABNORMAL HIGH (ref 0.80–1.20)

## 2013-01-22 NOTE — Progress Notes (Signed)
Subjective:    Patient ID: Brian [name], male    DOB: [birth year], 57 y.o.   MRN: [id number]  HPI Patient was admitted to the hospital recently.  I have copied relevant portions of the discharge summary below: Brief H and P:  For complete details please refer to admission H and P, but in brief Brian [name] is an 57 y.o. male with hx of DM, HTN, recent CVA, hyperlipidemia, and hx of seizure (still drives and on no ACT) presents to the ER as a code stroke as he was having sudden increase left upper extremity weakness tics around 5pm, followed by a tonic clonic seizure seen in the ER. He was given IV ativan, and seen by Neurology Dr Leonel Ramsay as a code stroke. He had a period when he was able to converse, however per the admitting doctor he was sedated at the time. Dilantin at 20mg  per kg was given as a bolus. Per daughter, he is a stoic man, had a stroke with left upper extremity weakness residual, but highly functional. He lives with his mother.  Hospital Course:  This is a 57 year old male with a past medical history of insulin requiring diabetes mellitus, hypertension, CVA causing some mild left-sided weakness, hyperlipidemia. The patient presented to the ER with sudden left-sided weakness followed by tonic-clonic seizure which was seen in the ER. This resolved after IV Ativan. A neuro consult was requested and the patient was bolused with Dilantin IV. Marland Kitchen   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 status epilepticus, not intractable: Patient was initially loaded with Dilantin and has been switched to oral Keppra. He will be discharged on this with outpatient followup with neurology. CT head revealed stable old infarcts and chronic small vessel disease. MRI of the brain was done which showed no acute intracranial abnormality, advanced but stable chronic ischemic disease, moderate sized 2011 history right MCA infarct with some associated chronic hemosiderin.  Gait disturbance patient was seen by physical therapy,  noticed some gait disturbance however impulsive. He was recommended home health PT, he was also recommended 24-hour supervision. Home health physical therapy, on and followup were arranged at his friend's house as patient was going to stay at his friend's house for some time. MRI was negative for any acute stroke.  Atrial fibrillation  -Currently sinus rhythm  - The patient was previously on Pradaxa but was not able to afford it and therefore was switched over to Coumadin. He will continue Coumadin for now. INR is therapeutic at 2.89.  DIABETES MELLITUS, TYPE II, UNCONTROLLED  There was significant confusion at the time of admission regarding his insulin. I was able to confirm that the patient, and his daughter, [name] and reviewing his primary care physician's notes. He is supposed to be on levemir (or lantus) 70 units in the morning and 40 units at bedtime. Plus he is also on Humulin R 30 units at supper. I have corrected it on the dc paperwork. Patient has an appointment with primary care physician on 01/22/2013, I explained to the patient and his daughter to discuss the insulin regimen at appointment.  Essential hypertension, benign  -Continue current antihypertensives  CONGESTIVE HEART FAILURE, SYSTOLIC DYSFUNCTION currently stable and compensated-  History of CVA-right parietal : May have been the likely cause of the seizures at the time of admission  ARF vs CKD 3 : Currently stable his baseline creatinine is around 1.9-2.3 . Currently 1.4 at the discharge.  He is here today for follow up.  To my knowledge, the patient  had never had a seizure prior to his recent hospitalization. His INR today in clinic is 3.0 and is therapeutic. Is some confusion about his insulin dose. He is currently supposed to be on Lantus 70 units in the morning and 40 units at night. He is post take regular insulin 30 units with supper. His last INR was 11.2 in September showing poor control. However apparently he is now  moved in with a friend and is eating much more healthfully. He recently had an episode of hypoglycemia to 32. He is eating low carbohydrate meals. He is concerned that he may now have hypoglycemia from much insulin. Past Medical History  Diagnosis Date  . Diabetes mellitus   . Hypertension   . Atrial fibrillation   . Cardiomyopathy   . Cerebrovascular accident   . Hyperlipidemia   . Seizures    Past Surgical History  Procedure Laterality Date  . Ear surgery as a child     Current Outpatient Prescriptions on File Prior to Visit  Medication Sig Dispense Refill  . amLODipine (NORVASC) 10 MG tablet Take 10 mg by mouth daily.      . carvedilol (COREG) 25 MG tablet Take 25 mg by mouth 2 (two) times daily with a meal.      . hydrALAZINE (APRESOLINE) 50 MG tablet Take 1 tablet (50 mg total) by mouth 3 (three) times daily.  90 tablet  3  . Insulin Glargine (LANTUS SOLOSTAR) 100 UNIT/ML SOPN Inject 45-70 Units into the skin 2 (two) times daily. Inject 70 units daily with breakfast and 40 units daily at bedtime.      . insulin regular (HUMULIN R) 100 units/mL injection Inject 0.3 mLs (30 Units total) into the skin daily with supper. Inject 30 units at supper time.  10 mL  12  . Insulin Syringe-Needle U-100 (INSULIN SYRINGE .3CC/31GX5/16") 31G X 5/16" 0.3 ML MISC Use once daily to inject insulin.       . Insulin Syringes, Disposable, U-100 1 ML MISC by Does not apply route. ReliOn insulin syringes  Capacity - 3/53ml - gauge - 30 - 2mm      . levETIRAcetam (KEPPRA) 250 MG tablet Take 1 tablet (250 mg total) by mouth 2 (two) times daily.  60 tablet  4  . ONE TOUCH ULTRA TEST test strip       . warfarin (COUMADIN) 10 MG tablet 10 mg qd except 15mg  on f/m       No current facility-administered medications on file prior to visit.   Allergies  Allergen Reactions  . Codeine Other (See Comments)    Unknown reaction   History   Social History  . Marital Status: Divorced    Spouse Name: N/A     Number of Children: N/A  . Years of Education: N/A   Occupational History  . Not on file.   Social History Main Topics  . Smoking status: Never Smoker   . Smokeless tobacco: Never Used  . Alcohol Use: No  . Drug Use: Not on file  . Sexual Activity: Not on file   Other Topics Concern  . Not on file   Social History Narrative   He has children.      Review of Systems  All other systems reviewed and are negative.       Objective:   Physical Exam  Vitals reviewed. Constitutional: He is oriented to person, place, and time.  Cardiovascular: Normal rate and normal heart sounds.   No murmur heard.  Pulmonary/Chest: Effort normal and breath sounds normal.  Abdominal: Soft. Bowel sounds are normal.  Neurological: He is alert and oriented to person, place, and time. He has normal reflexes. No cranial nerve deficit. He exhibits normal muscle tone. Coordination normal.          Assessment & Plan:  1. A-fib Continue Coumadin at its present dose and recheck INR in 4 weeks. - PT with INR/Fingerstick  2. Hospital discharge follow-up I encouraged the patient to make his followup appoint with the neurologist which he has not done yet. This is the first seizure that he can remember with no. I do not have any past medical history of a seizure before. I believe the patient was confused when he told this to the hospital. However I did recommend the patient not drive for the next 6 months until he has been seizure-free for 6 months. Have also recommended he continue Lantus 70 units in the morning and 40 units at night discontinue regular insulin with meals. I recommended he check his fasting blood sugar every morning and his two-hour postprandial sugars after every meal. I will see the patient back in one week and review his two-hour postprandial sugars with him. If they're greater than 200 add regular insulin at meals. That way we can tailor his insulin dose to his new diet.

## 2013-01-23 ENCOUNTER — Telehealth: Payer: Self-pay | Admitting: Family Medicine

## 2013-01-23 NOTE — Telephone Encounter (Signed)
Brian [name] is calling from [facility] and she has tried several times this morning to get in touch with you  Call back number is [phone]

## 2013-01-23 NOTE — Telephone Encounter (Signed)
LMTRC

## 2013-01-29 ENCOUNTER — Ambulatory Visit (INDEPENDENT_AMBULATORY_CARE_PROVIDER_SITE_OTHER): Payer: Medicare Other | Admitting: Family Medicine

## 2013-01-29 ENCOUNTER — Encounter: Payer: Self-pay | Admitting: Family Medicine

## 2013-01-29 VITALS — BP 140/80 | HR 78 | Temp 97.4°F | Resp 18 | Ht 69.0 in | Wt 214.0 lb

## 2013-01-29 DIAGNOSIS — I10 Essential (primary) hypertension: Secondary | ICD-10-CM

## 2013-01-29 DIAGNOSIS — IMO0001 Reserved for inherently not codable concepts without codable children: Secondary | ICD-10-CM

## 2013-01-29 DIAGNOSIS — Z23 Encounter for immunization: Secondary | ICD-10-CM

## 2013-01-29 MED ORDER — GLUCOSE BLOOD VI STRP: ORAL_STRIP | Status: DC

## 2013-01-29 NOTE — Addendum Note (Signed)
Addended by: Shary Decamp B on: 01/29/2013 09:12 AM   Modules accepted: Orders

## 2013-01-29 NOTE — Progress Notes (Signed)
Subjective:    Patient ID: Brian [name], male    DOB: Jun 09, 1955, 57 y.o.   MRN: [id number]  HPI 01/22/13 Patient was admitted to the hospital recently.  I have copied relevant portions of the discharge summary below: Brief H and P:  For complete details please refer to admission H and P, but in brief Brian [name] is an 57 y.o. male with hx of DM, HTN, recent CVA, hyperlipidemia, and hx of seizure (still drives and on no ACT) presents to the ER as a code stroke as he was having sudden increase left upper extremity weakness tics around 5pm, followed by a tonic clonic seizure seen in the ER. He was given IV ativan, and seen by Neurology Dr Brian [name] as a code stroke. He had a period when he was able to converse, however per the admitting doctor he was sedated at the time. Dilantin at 20mg  per kg was given as a bolus. Per daughter, he is a stoic man, had a stroke with left upper extremity weakness residual, but highly functional. He lives with his mother.  Hospital Course:  This is a 57 year old male with a past medical history of insulin requiring diabetes mellitus, hypertension, CVA causing some mild left-sided weakness, hyperlipidemia. The patient presented to the ER with sudden left-sided weakness followed by tonic-clonic seizure which was seen in the ER. This resolved after IV Ativan. A neuro consult was requested and the patient was bolused with Dilantin IV. Marland Kitchen   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 status epilepticus, not intractable: Patient was initially loaded with Dilantin and has been switched to oral Keppra. He will be discharged on this with outpatient followup with neurology. CT head revealed stable old infarcts and chronic small vessel disease. MRI of the brain was done which showed no acute intracranial abnormality, advanced but stable chronic ischemic disease, moderate sized 2011 history right MCA infarct with some associated chronic hemosiderin.  Gait disturbance patient was seen by physical  therapy, noticed some gait disturbance however impulsive. He was recommended home health PT, he was also recommended 24-hour supervision. Home health physical therapy, on and followup were arranged at his friend's house as patient was going to stay at his friend's house for some time. MRI was negative for any acute stroke.  Atrial fibrillation  -Currently sinus rhythm  - The patient was previously on Pradaxa but was not able to afford it and therefore was switched over to Coumadin. He will continue Coumadin for now. INR is therapeutic at 2.89.  DIABETES MELLITUS, TYPE II, UNCONTROLLED  There was significant confusion at the time of admission regarding his insulin. I was able to confirm that the patient, and his daughter, Brian [name] and reviewing his primary care physician's notes. He is supposed to be on levemir (or lantus) 70 units in the morning and 40 units at bedtime. Plus he is also on Humulin R 30 units at supper. I have corrected it on the dc paperwork. Patient has an appointment with primary care physician on 01/22/2013, I explained to the patient and his daughter to discuss the insulin regimen at appointment.  Essential hypertension, benign  -Continue current antihypertensives  CONGESTIVE HEART FAILURE, SYSTOLIC DYSFUNCTION currently stable and compensated-  History of CVA-right parietal : May have been the likely cause of the seizures at the time of admission  ARF vs CKD 3 : Currently stable his baseline creatinine is around 1.9-2.3 . Currently 1.4 at the discharge.  He is here today for follow up.  To my knowledge, the  patient had never had a seizure prior to his recent hospitalization. His INR today in clinic is 3.0 and is therapeutic. Is some confusion about his insulin dose. He is currently supposed to be on Lantus 70 units in the morning and 40 units at night. He is post take regular insulin 30 units with supper. His last INR was 11.2 in September showing poor control. However apparently he  is now moved in with a friend and is eating much more healthfully. He recently had an episode of hypoglycemia to 32. He is eating low carbohydrate meals. He is concerned that he may now have hypoglycemia from much insulin.  At that time, my plan was: 1. A-fib Continue Coumadin at its present dose and recheck INR in 4 weeks. - PT with INR/Fingerstick  2. Hospital discharge follow-up I encouraged the patient to make his followup appoint with the neurologist which he has not done yet. This is the first seizure that he can remember.. I do not have any past medical history of a seizure before. I believe the patient was confused when he told this to the hospital. However I did recommend the patient not drive for the next 6 months until he has been seizure-free for 6 months. Have also recommended he continue Lantus 70 units in the morning and 40 units at night discontinue regular insulin with meals. I recommended he check his fasting blood sugar every morning and his two-hour postprandial sugars after every meal. I will see the patient back in one week and review his two-hour postprandial sugars with him. If they're greater than 200 add regular insulin at meals. That way we can tailor his insulin dose to his new diet.  01/29/13 Patient is here today for followup.  Blood sugars are actually doing very well. His fasting blood sugars are 70-100. Postprandial sugars are 100 to 1:30. He has had occasional episodes of hypoglycemia in the mornings. He had Pneumovax 23 in 2011 had his flu shot. He is due for Prevnar 13 given his significant past medical history of facial fibrillation, stroke, cardiomyopathy, diabetes, and chronic kidney disease.  He is not due for recheck his INR for 3 weeks.  Past Medical History  Diagnosis Date  . Diabetes mellitus   . Hypertension   . Atrial fibrillation   . Cardiomyopathy   . Cerebrovascular accident   . Hyperlipidemia   . Seizures    Past Surgical History  Procedure  Laterality Date  . Ear surgery as a child     Current Outpatient Prescriptions on File Prior to Visit  Medication Sig Dispense Refill  . amLODipine (NORVASC) 10 MG tablet Take 10 mg by mouth daily.      . carvedilol (COREG) 25 MG tablet Take 25 mg by mouth 2 (two) times daily with a meal.      . hydrALAZINE (APRESOLINE) 50 MG tablet Take 1 tablet (50 mg total) by mouth 3 (three) times daily.  90 tablet  3  . insulin regular (HUMULIN R) 100 units/mL injection Inject 0.3 mLs (30 Units total) into the skin daily with supper. Inject 30 units at supper time.  10 mL  12  . Insulin Syringe-Needle U-100 (INSULIN SYRINGE .3CC/31GX5/16") 31G X 5/16" 0.3 ML MISC Use once daily to inject insulin.       . Insulin Syringes, Disposable, U-100 1 ML MISC by Does not apply route. ReliOn insulin syringes  Capacity - 3/49ml - gauge - 30 - 46mm      . levETIRAcetam (  KEPPRA) 250 MG tablet Take 1 tablet (250 mg total) by mouth 2 (two) times daily.  60 tablet  4  . ONE TOUCH ULTRA TEST test strip       . warfarin (COUMADIN) 10 MG tablet 10 mg qd except 15mg  on f/m       No current facility-administered medications on file prior to visit.   Allergies  Allergen Reactions  . Codeine Other (See Comments)    Unknown reaction   History   Social History  . Marital Status: Divorced    Spouse Name: N/A    Number of Children: N/A  . Years of Education: N/A   Occupational History  . Not on file.   Social History Main Topics  . Smoking status: Never Smoker   . Smokeless tobacco: Never Used  . Alcohol Use: No  . Drug Use: Not on file  . Sexual Activity: Not on file   Other Topics Concern  . Not on file   Social History Narrative   He has children.      Review of Systems  All other systems reviewed and are negative.       Objective:   Physical Exam  Vitals reviewed. Constitutional: He is oriented to person, place, and time.  Cardiovascular: Normal rate and normal heart sounds.   No murmur  heard. Pulmonary/Chest: Effort normal and breath sounds normal.  Abdominal: Soft. Bowel sounds are normal.  Neurological: He is alert and oriented to person, place, and time. He has normal reflexes. No cranial nerve deficit. He exhibits normal muscle tone. Coordination normal.          Assessment & Plan:  1. Type II or unspecified type diabetes mellitus without mention of complication, uncontrolled Blood sugars are excellent. He has been very diligent about checking his blood sugar and recording them. There has not been a single episode of hypoglycemia since discontinuing the regular insulin. Today I will continue 70 units of Lantus in the morning, but I will decrease Lantus in the evening and 30 units hypoglycemia in the morning. Also recommended he take his nighttime insulin with a snack. Also given his significant past medical history I will give the patient Prevnar 13.  2. Essential hypertension, benign Well controlled. Continue same

## 2013-02-11 ENCOUNTER — Encounter: Payer: Self-pay | Admitting: Cardiovascular Disease

## 2013-02-20 ENCOUNTER — Ambulatory Visit (INDEPENDENT_AMBULATORY_CARE_PROVIDER_SITE_OTHER): Payer: Managed Care, Other (non HMO) | Admitting: Family Medicine

## 2013-02-20 ENCOUNTER — Encounter: Payer: Self-pay | Admitting: Family Medicine

## 2013-02-20 VITALS — BP 122/68 | HR 78 | Temp 98.3°F | Resp 18 | Ht 69.0 in | Wt 214.0 lb

## 2013-02-20 DIAGNOSIS — I4891 Unspecified atrial fibrillation: Secondary | ICD-10-CM

## 2013-02-20 LAB — PT WITH INR/FINGERSTICK
INR FINGERSTICK: 3.6 — AB (ref 0.80–1.20)
PT FINGERSTICK: 42.8 s — AB (ref 10.4–12.5)

## 2013-02-20 NOTE — Progress Notes (Signed)
Subjective:    Patient ID: Brian [name], male    DOB: Jun 09, 1955, 58 y.o.   MRN: [id number]  HPI 01/22/13 Patient was admitted to the hospital recently.  I have copied relevant portions of the discharge summary below: Brief H and P:  For complete details please refer to admission H and P, but in brief Brian [name] is an 58 y.o. male with hx of DM, HTN, recent CVA, hyperlipidemia, and hx of seizure (still drives and on no ACT) presents to the ER as a code stroke as he was having sudden increase left upper extremity weakness tics around 5pm, followed by a tonic clonic seizure seen in the ER. He was given IV ativan, and seen by Neurology Dr Brian [name] as a code stroke. He had a period when he was able to converse, however per the admitting doctor he was sedated at the time. Dilantin at 20mg  per kg was given as a bolus. Per daughter, he is a stoic man, had a stroke with left upper extremity weakness residual, but highly functional. He lives with his mother.  Hospital Course:  This is a 58 year old male with a past medical history of insulin requiring diabetes mellitus, hypertension, CVA causing some mild left-sided weakness, hyperlipidemia. The patient presented to the ER with sudden left-sided weakness followed by tonic-clonic seizure which was seen in the ER. This resolved after IV Ativan. A neuro consult was requested and the patient was bolused with Dilantin IV. Marland Kitchen   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 status epilepticus, not intractable: Patient was initially loaded with Dilantin and has been switched to oral Keppra. He will be discharged on this with outpatient followup with neurology. CT head revealed stable old infarcts and chronic small vessel disease. MRI of the brain was done which showed no acute intracranial abnormality, advanced but stable chronic ischemic disease, moderate sized 2011 history right MCA infarct with some associated chronic hemosiderin.  Gait disturbance patient was seen by physical  therapy, noticed some gait disturbance however impulsive. He was recommended home health PT, he was also recommended 24-hour supervision. Home health physical therapy, on and followup were arranged at his friend's house as patient was going to stay at his friend's house for some time. MRI was negative for any acute stroke.  Atrial fibrillation  -Currently sinus rhythm  - The patient was previously on Pradaxa but was not able to afford it and therefore was switched over to Coumadin. He will continue Coumadin for now. INR is therapeutic at 2.89.  DIABETES MELLITUS, TYPE II, UNCONTROLLED  There was significant confusion at the time of admission regarding his insulin. I was able to confirm that the patient, and his daughter, Brian [name] and reviewing his primary care physician's notes. He is supposed to be on levemir (or lantus) 70 units in the morning and 40 units at bedtime. Plus he is also on Humulin R 30 units at supper. I have corrected it on the dc paperwork. Patient has an appointment with primary care physician on 01/22/2013, I explained to the patient and his daughter to discuss the insulin regimen at appointment.  Essential hypertension, benign  -Continue current antihypertensives  CONGESTIVE HEART FAILURE, SYSTOLIC DYSFUNCTION currently stable and compensated-  History of CVA-right parietal : May have been the likely cause of the seizures at the time of admission  ARF vs CKD 3 : Currently stable his baseline creatinine is around 1.9-2.3 . Currently 1.4 at the discharge.  He is here today for follow up.  To my knowledge, the  patient had never had a seizure prior to his recent hospitalization. His INR today in clinic is 3.0 and is therapeutic. Is some confusion about his insulin dose. He is currently supposed to be on Lantus 70 units in the morning and 40 units at night. He is post take regular insulin 30 units with supper. His last INR was 11.2 in September showing poor control. However apparently he  is now moved in with a friend and is eating much more healthfully. He recently had an episode of hypoglycemia to 32. He is eating low carbohydrate meals. He is concerned that he may now have hypoglycemia from much insulin.  At that time, my plan was: 1. A-fib Continue Coumadin at its present dose and recheck INR in 4 weeks. - PT with INR/Fingerstick  2. Hospital discharge follow-up I encouraged the patient to make his followup appoint with the neurologist which he has not done yet. This is the first seizure that he can remember.. I do not have any past medical history of a seizure before. I believe the patient was confused when he told this to the hospital. However I did recommend the patient not drive for the next 6 months until he has been seizure-free for 6 months. Have also recommended he continue Lantus 70 units in the morning and 40 units at night discontinue regular insulin with meals. I recommended he check his fasting blood sugar every morning and his two-hour postprandial sugars after every meal. I will see the patient back in one week and review his two-hour postprandial sugars with him. If they're greater than 200 add regular insulin at meals. That way we can tailor his insulin dose to his new diet.  01/29/13 Patient is here today for followup.  Blood sugars are actually doing very well. His fasting blood sugars are 70-100. Postprandial sugars are 100 to 130. He has had occasional episodes of hypoglycemia in the mornings. He had Pneumovax 23 in 2011 had his flu shot. He is due for Prevnar 13 given his significant past medical history of facial fibrillation, stroke, cardiomyopathy, diabetes, and chronic kidney disease.  He is not due for recheck his INR for 3 weeks.  At that time, my plan was: 1. Type II or unspecified type diabetes mellitus without mention of complication, uncontrolled Blood sugars are excellent. He has been very diligent about checking his blood sugar and recording them.  There has not been a single episode of hypoglycemia since discontinuing the regular insulin. Today I will continue 70 units of Lantus in the morning, but I will decrease Lantus in the evening to 30 units due to hypoglycemia in the morning. Also recommended he take his nighttime insulin with a snack. Also given his significant past medical history I will give the patient Prevnar 13.  2. Essential hypertension, benign Well controlled. Continue same  02/20/13 He is here today for a recheck. He is also to check his INR level for his atrial fibrillation/Coumadin dosing.  He brings in 2 weeks with a fasting blood sugars all of which are in the range of 70-130. He has 2 weeks of two-hour postprandial sugars scattered randomly throughout the day. The majority rate and 102 100. He has an occasional 300. He has no evidence of hypoglycemia. He is doing well. He has no concerns today. I am very pleased with his sugar values.  Unfortunately his INR is 3.6 and supratherapeutic.  Patient is currently taking Coumadin 15 mg on Mondays and Fridays, 10 mg every other day  Past Medical History  Diagnosis Date  . Diabetes mellitus   . Hypertension   . Atrial fibrillation   . Cardiomyopathy   . Cerebrovascular accident   . Hyperlipidemia   . Seizures    Past Surgical History  Procedure Laterality Date  . Ear surgery as a child     Current Outpatient Prescriptions on File Prior to Visit  Medication Sig Dispense Refill  . amLODipine (NORVASC) 10 MG tablet Take 10 mg by mouth daily.      . carvedilol (COREG) 25 MG tablet Take 25 mg by mouth 2 (two) times daily with a meal.      . glucose blood (ONE TOUCH ULTRA TEST) test strip Pt checks BS qid Dx 250.00  200 each  5  . hydrALAZINE (APRESOLINE) 50 MG tablet Take 1 tablet (50 mg total) by mouth 3 (three) times daily.  90 tablet  3  . Insulin Glargine 100 UNIT/ML SOPN Inject 40 Units into the skin 2 (two) times daily. Inject 70 units daily with breakfast and 40 units  daily at bedtime.      . Insulin Syringe-Needle U-100 (INSULIN SYRINGE .3CC/31GX5/16") 31G X 5/16" 0.3 ML MISC Use once daily to inject insulin.       . Insulin Syringes, Disposable, U-100 1 ML MISC by Does not apply route. ReliOn insulin syringes  Capacity - 3/63ml - gauge - 30 - 81mm      . levETIRAcetam (KEPPRA) 250 MG tablet Take 1 tablet (250 mg total) by mouth 2 (two) times daily.  60 tablet  4  . warfarin (COUMADIN) 10 MG tablet 10 mg qd except 15mg  on f/m      . insulin regular (HUMULIN R) 100 units/mL injection Inject 0.3 mLs (30 Units total) into the skin daily with supper. Inject 30 units at supper time.  10 mL  12   No current facility-administered medications on file prior to visit.   Allergies  Allergen Reactions  . Codeine Other (See Comments)    Unknown reaction   History   Social History  . Marital Status: Divorced    Spouse Name: N/A    Number of Children: N/A  . Years of Education: N/A   Occupational History  . Not on file.   Social History Main Topics  . Smoking status: Never Smoker   . Smokeless tobacco: Never Used  . Alcohol Use: No  . Drug Use: Not on file  . Sexual Activity: Not on file   Other Topics Concern  . Not on file   Social History Narrative   He has children.      Review of Systems  All other systems reviewed and are negative.       Objective:   Physical Exam  Vitals reviewed. Constitutional: He is oriented to person, place, and time.  Cardiovascular: Normal rate and normal heart sounds.   No murmur heard. Pulmonary/Chest: Effort normal and breath sounds normal.  Abdominal: Soft. Bowel sounds are normal.  Neurological: He is alert and oriented to person, place, and time. He has normal reflexes. No cranial nerve deficit. He exhibits normal muscle tone. Coordination normal.          Assessment & Plan:  1. A-fib Decrease Coumadin to 15 mg on Fridays only and 10 mg on every other day and recheck in 4 weeks. - PT with  INR/Fingerstick 2.  Diabetes-  continue Lantus 70 units subcutaneous every morning, and 30 units subcutaneous every afternoon. Continued checking fasting blood sugars  and 2 hour postprandial sugars. At the present time his blood sugars are well controlled along with his blood pressure.  No changes in medication at this point. Recheck in one month.

## 2013-02-25 ENCOUNTER — Encounter: Payer: Self-pay | Admitting: Family Medicine

## 2013-02-25 NOTE — Telephone Encounter (Signed)
Mobile # no longer in service - home phone is busy

## 2013-02-25 NOTE — Telephone Encounter (Signed)
Pt is wanting to speak to you about his insulin Call back number is [phone]

## 2013-03-05 NOTE — Telephone Encounter (Signed)
This encounter was created in error - please disregard.

## 2013-03-23 ENCOUNTER — Ambulatory Visit: Payer: Self-pay | Admitting: Family Medicine

## 2013-03-27 ENCOUNTER — Encounter: Payer: Self-pay | Admitting: Family Medicine

## 2013-03-27 ENCOUNTER — Ambulatory Visit (INDEPENDENT_AMBULATORY_CARE_PROVIDER_SITE_OTHER): Payer: Managed Care, Other (non HMO) | Admitting: Family Medicine

## 2013-03-27 VITALS — BP 140/90 | HR 80 | Temp 97.2°F | Resp 18 | Ht 69.0 in | Wt 214.0 lb

## 2013-03-27 DIAGNOSIS — I4891 Unspecified atrial fibrillation: Secondary | ICD-10-CM

## 2013-03-27 DIAGNOSIS — IMO0001 Reserved for inherently not codable concepts without codable children: Secondary | ICD-10-CM

## 2013-03-27 DIAGNOSIS — E1165 Type 2 diabetes mellitus with hyperglycemia: Secondary | ICD-10-CM

## 2013-03-27 LAB — COMPLETE METABOLIC PANEL WITH GFR
ALT: 18 U/L (ref 0–53)
AST: 16 U/L (ref 0–37)
Albumin: 4 g/dL (ref 3.5–5.2)
Alkaline Phosphatase: 63 U/L (ref 39–117)
BUN: 18 mg/dL (ref 6–23)
CALCIUM: 9.2 mg/dL (ref 8.4–10.5)
CHLORIDE: 105 meq/L (ref 96–112)
CO2: 22 mEq/L (ref 19–32)
CREATININE: 1.43 mg/dL — AB (ref 0.50–1.35)
GFR, Est African American: 62 mL/min
GFR, Est Non African American: 53 mL/min — ABNORMAL LOW
Glucose, Bld: 211 mg/dL — ABNORMAL HIGH (ref 70–99)
Potassium: 4.1 mEq/L (ref 3.5–5.3)
Sodium: 138 mEq/L (ref 135–145)
Total Bilirubin: 0.4 mg/dL (ref 0.2–1.2)
Total Protein: 6.7 g/dL (ref 6.0–8.3)

## 2013-03-27 LAB — PT WITH INR/FINGERSTICK
INR, fingerstick: 2.5 — ABNORMAL HIGH (ref 0.80–1.20)
PT, fingerstick: 29.8 seconds — ABNORMAL HIGH (ref 10.4–12.5)

## 2013-03-27 LAB — HEMOGLOBIN A1C
Hgb A1c MFr Bld: 6.9 % — ABNORMAL HIGH (ref <5.7)
Mean Plasma Glucose: 151 mg/dL — ABNORMAL HIGH (ref <117)

## 2013-03-27 NOTE — Progress Notes (Signed)
Subjective:    Patient ID: Brian [name], male    DOB: 27-Oct-1955, 58 y.o.   MRN: [id number]  HPI 01/22/13 Patient was admitted to the hospital recently.  I have copied relevant portions of the discharge summary below: Brief H and P:  For complete details please refer to admission H and P, but in brief Brian [name] is an 58 y.o. male with hx of DM, HTN, recent CVA, hyperlipidemia, and hx of seizure (still drives and on no ACT) presents to the ER as a code stroke as he was having sudden increase left upper extremity weakness tics around 5pm, followed by a tonic clonic seizure seen in the ER. He was given IV ativan, and seen by Neurology Dr Leonel Ramsay as a code stroke. He had a period when he was able to converse, however per the admitting doctor he was sedated at the time. Dilantin at 20mg  per kg was given as a bolus. Per daughter, he is a stoic man, had a stroke with left upper extremity weakness residual, but highly functional. He lives with his mother.  Hospital Course:  This is a 58 year old male with a past medical history of insulin requiring diabetes mellitus, hypertension, CVA causing some mild left-sided weakness, hyperlipidemia. The patient presented to the ER with sudden left-sided weakness followed by tonic-clonic seizure which was seen in the ER. This resolved after IV Ativan. A neuro consult was requested and the patient was bolused with Dilantin IV. Marland Kitchen   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 status epilepticus, not intractable: Patient was initially loaded with Dilantin and has been switched to oral Keppra. He will be discharged on this with outpatient followup with neurology. CT head revealed stable old infarcts and chronic small vessel disease. MRI of the brain was done which showed no acute intracranial abnormality, advanced but stable chronic ischemic disease, moderate sized 2011 history right MCA infarct with some associated chronic hemosiderin.  Gait disturbance patient was seen by physical  therapy, noticed some gait disturbance however impulsive. He was recommended home health PT, he was also recommended 24-hour supervision. Home health physical therapy, on and followup were arranged at his friend's house as patient was going to stay at his friend's house for some time. MRI was negative for any acute stroke.  Atrial fibrillation  -Currently sinus rhythm  - The patient was previously on Pradaxa but was not able to afford it and therefore was switched over to Coumadin. He will continue Coumadin for now. INR is therapeutic at 2.89.  DIABETES MELLITUS, TYPE II, UNCONTROLLED  There was significant confusion at the time of admission regarding his insulin. I was able to confirm that the patient, and his daughter, Brian [name] and reviewing his primary care physician's notes. He is supposed to be on levemir (or lantus) 70 units in the morning and 40 units at bedtime. Plus he is also on Humulin R 30 units at supper. I have corrected it on the dc paperwork. Patient has an appointment with primary care physician on 01/22/2013, I explained to the patient and his daughter to discuss the insulin regimen at appointment.  Essential hypertension, benign  -Continue current antihypertensives  CONGESTIVE HEART FAILURE, SYSTOLIC DYSFUNCTION currently stable and compensated-  History of CVA-right parietal : May have been the likely cause of the seizures at the time of admission  ARF vs CKD 3 : Currently stable his baseline creatinine is around 1.9-2.3 . Currently 1.4 at the discharge.  He is here today for follow up.  To my knowledge, the  patient had never had a seizure prior to his recent hospitalization. His INR today in clinic is 3.0 and is therapeutic. Is some confusion about his insulin dose. He is currently supposed to be on Lantus 70 units in the morning and 40 units at night. He is post take regular insulin 30 units with supper. His last INR was 11.2 in September showing poor control. However apparently he  is now moved in with a friend and is eating much more healthfully. He recently had an episode of hypoglycemia to 32. He is eating low carbohydrate meals. He is concerned that he may now have hypoglycemia from much insulin.  At that time, my plan was: 1. A-fib Continue Coumadin at its present dose and recheck INR in 4 weeks. - PT with INR/Fingerstick  2. Hospital discharge follow-up I encouraged the patient to make his followup appoint with the neurologist which he has not done yet. This is the first seizure that he can remember.. I do not have any past medical history of a seizure before. I believe the patient was confused when he told this to the hospital. However I did recommend the patient not drive for the next 6 months until he has been seizure-free for 6 months. Have also recommended he continue Lantus 70 units in the morning and 40 units at night discontinue regular insulin with meals. I recommended he check his fasting blood sugar every morning and his two-hour postprandial sugars after every meal. I will see the patient back in one week and review his two-hour postprandial sugars with him. If they're greater than 200 add regular insulin at meals. That way we can tailor his insulin dose to his new diet.  01/29/13 Patient is here today for followup.  Blood sugars are actually doing very well. His fasting blood sugars are 70-100. Postprandial sugars are 100 to 130. He has had occasional episodes of hypoglycemia in the mornings. He had Pneumovax 23 in 2011 had his flu shot. He is due for Prevnar 13 given his significant past medical history of facial fibrillation, stroke, cardiomyopathy, diabetes, and chronic kidney disease.  He is not due for recheck his INR for 3 weeks.  At that time, my plan was: 1. Type II or unspecified type diabetes mellitus without mention of complication, uncontrolled Blood sugars are excellent. He has been very diligent about checking his blood sugar and recording them.  There has not been a single episode of hypoglycemia since discontinuing the regular insulin. Today I will continue 70 units of Lantus in the morning, but I will decrease Lantus in the evening to 30 units due to hypoglycemia in the morning. Also recommended he take his nighttime insulin with a snack. Also given his significant past medical history I will give the patient Prevnar 13.  2. Essential hypertension, benign Well controlled. Continue same  02/20/13 He is here today for a recheck. He is also to check his INR level for his atrial fibrillation/Coumadin dosing.  He brings in 2 weeks with a fasting blood sugars all of which are in the range of 70-130. He has 2 weeks of two-hour postprandial sugars scattered randomly throughout the day. The majority rate and 102 100. He has an occasional 300. He has no evidence of hypoglycemia. He is doing well. He has no concerns today. I am very pleased with his sugar values.  Unfortunately his INR is 3.6 and supratherapeutic.  Patient is currently taking Coumadin 15 mg on Mondays and Fridays, 10 mg every other day.  At that time, my plan was: 1. A-fib Decrease Coumadin to 15 mg on Fridays only and 10 mg on every other day and recheck in 4 weeks. - PT with INR/Fingerstick 2.  Diabetes-  continue Lantus 70 units subcutaneous every morning, and 30 units subcutaneous every afternoon. Continued checking fasting blood sugars and 2 hour postprandial sugars. At the present time his blood sugars are well controlled along with his blood pressure.  No changes in medication at this point. Recheck in one month.  03/27/13 Patient is here today for followup. He is still taking Lantus 70 units in the morning 30 units in the evening. His fasting blood sugars range 100 131. His two-hour postprandial sugars however slightly high at 160-180. He is very resistant to adding regular insulin with his meals due to the hypoglycemic episode he had in the fall.  He is also here today to recheck  an INR. He is currently using Coumadin 2 mg every day except Fridays. On Fridays he takes 15 mg. His INR is therapeutic today. Office Visit on 03/27/2013  Component Date Value Range Status  . PT, fingerstick 03/27/2013 29.8* 10.4 - 12.5 seconds Final  . INR, fingerstick 03/27/2013 2.5* 0.80 - 1.20 Final   Comment: The INR is of principal utility in following patients on stable doses                          of oral anticoagulants.  The therapeutic range is generally 2.0 to                          3.0, but may be 3.0 to 4.0 in patients with mechanical cardiac valves,                          recurrent embolisms and antiphospholipid antibodies (including lupus                          inhibitors).     Past Medical History  Diagnosis Date  . Diabetes mellitus   . Hypertension   . Atrial fibrillation   . Cardiomyopathy   . Cerebrovascular accident   . Hyperlipidemia   . Seizures    Past Surgical History  Procedure Laterality Date  . Ear surgery as a child     Current Outpatient Prescriptions on File Prior to Visit  Medication Sig Dispense Refill  . amLODipine (NORVASC) 10 MG tablet Take 10 mg by mouth daily.      . carvedilol (COREG) 25 MG tablet Take 25 mg by mouth 2 (two) times daily with a meal.      . glucose blood (ONE TOUCH ULTRA TEST) test strip Pt checks BS qid Dx 250.00  200 each  5  . hydrALAZINE (APRESOLINE) 50 MG tablet Take 1 tablet (50 mg total) by mouth 3 (three) times daily.  90 tablet  3  . Insulin Glargine 100 UNIT/ML SOPN Inject 40 Units into the skin 2 (two) times daily. Inject 70 units daily with breakfast and 40 units daily at bedtime. (Levemir)      . Insulin Syringe-Needle U-100 (INSULIN SYRINGE .3CC/31GX5/16") 31G X 5/16" 0.3 ML MISC Use once daily to inject insulin.       . Insulin Syringes, Disposable, U-100 1 ML MISC by Does not apply route. ReliOn insulin syringes  Capacity - 3/59ml - gauge - 30 -  32mm      . levETIRAcetam (KEPPRA) 250 MG tablet Take 1  tablet (250 mg total) by mouth 2 (two) times daily.  60 tablet  4  . warfarin (COUMADIN) 10 MG tablet 10 mg qd except 15mg  on f/m       No current facility-administered medications on file prior to visit.   Allergies  Allergen Reactions  . Codeine Other (See Comments)    Unknown reaction   History   Social History  . Marital Status: Divorced    Spouse Name: N/A    Number of Children: N/A  . Years of Education: N/A   Occupational History  . Not on file.   Social History Main Topics  . Smoking status: Never Smoker   . Smokeless tobacco: Never Used  . Alcohol Use: No  . Drug Use: Not on file  . Sexual Activity: Not on file   Other Topics Concern  . Not on file   Social History Narrative   He has children.      Review of Systems  All other systems reviewed and are negative.       Objective:   Physical Exam  Vitals reviewed. Constitutional: He is oriented to person, place, and time.  Cardiovascular: Normal rate and normal heart sounds.   No murmur heard. Pulmonary/Chest: Effort normal and breath sounds normal.  Abdominal: Soft. Bowel sounds are normal.  Neurological: He is alert and oriented to person, place, and time. He has normal reflexes. No cranial nerve deficit. He exhibits normal muscle tone. Coordination normal.          Assessment & Plan:  1. A-fib Continue Coumadin 10 mg every day except Friday. On Friday take 15 mg. Recheck INR in one month the - PT with INR/Fingerstick  2. Type II or unspecified type diabetes mellitus without mention of complication, uncontrolled Patient's last hemoglobin A1c was greater than 11. His reported blood sugars today are excellent. I will check a hemoglobin A1c today to get a true evaluation of how sugars have been running the last 3 months. If he continues to have high post prandial sugars, the patient may benefit from tradjenta. - Hemoglobin A1c - COMPLETE METABOLIC PANEL WITH GFR

## 2013-04-21 ENCOUNTER — Other Ambulatory Visit: Payer: Self-pay | Admitting: Family Medicine

## 2013-04-21 NOTE — Telephone Encounter (Signed)
Refill appropriate and filled per protocol. 

## 2013-04-24 ENCOUNTER — Ambulatory Visit (INDEPENDENT_AMBULATORY_CARE_PROVIDER_SITE_OTHER): Payer: Managed Care, Other (non HMO) | Admitting: Family Medicine

## 2013-04-24 ENCOUNTER — Encounter: Payer: Self-pay | Admitting: Family Medicine

## 2013-04-24 VITALS — BP 120/78 | HR 18 | Temp 97.3°F | Resp 18 | Ht 69.0 in | Wt 217.0 lb

## 2013-04-24 DIAGNOSIS — I4891 Unspecified atrial fibrillation: Secondary | ICD-10-CM

## 2013-04-24 LAB — PT WITH INR/FINGERSTICK
INR FINGERSTICK: 3.1 — AB (ref 0.80–1.20)
PT FINGERSTICK: 37.8 s — AB (ref 10.4–12.5)

## 2013-04-24 MED ORDER — EFINACONAZOLE 10 % EX SOLN: 1.0000 "application " | Freq: Every day | CUTANEOUS | Status: DC

## 2013-04-24 NOTE — Progress Notes (Signed)
Subjective:    Patient ID: Brian [name], male    DOB: Jan 15, 1956, 58 y.o.   MRN: [id number]  HPI Patient is currently on Coumadin 15 mg today, 10 mg every other day. He takes this for atrial fibrillation. His INR today in clinic is 3.1. However it is well controlled and I expect it to be slightly elevated today as today is his high dose.  Otherwise he is doing well. His blood pressures well controlled. His most recent hemoglobin A1c was 6.9 which is outstanding for this patient. His lab work was also normal. He is interested in a topical antifungal for his toenail fungus. Past Medical History  Diagnosis Date  . Diabetes mellitus   . Hypertension   . Atrial fibrillation   . Cardiomyopathy   . Cerebrovascular accident   . Hyperlipidemia   . Seizures    Current Outpatient Prescriptions on File Prior to Visit  Medication Sig Dispense Refill  . amLODipine (NORVASC) 10 MG tablet Take 10 mg by mouth daily.      . carvedilol (COREG) 25 MG tablet Take 25 mg by mouth 2 (two) times daily with a meal.      . glucose blood (ONE TOUCH ULTRA TEST) test strip Pt checks BS qid Dx 250.00  200 each  5  . hydrALAZINE (APRESOLINE) 50 MG tablet Take 1 tablet (50 mg total) by mouth 3 (three) times daily.  90 tablet  3  . Insulin Glargine 100 UNIT/ML SOPN Inject 40 Units into the skin 2 (two) times daily. Inject 70 units daily with breakfast and 40 units daily at bedtime. (Levemir)      . Insulin Syringe-Needle U-100 (INSULIN SYRINGE .3CC/31GX5/16") 31G X 5/16" 0.3 ML MISC Use once daily to inject insulin.       . Insulin Syringes, Disposable, U-100 1 ML MISC by Does not apply route. ReliOn insulin syringes  Capacity - 3/88ml - gauge - 30 - 33mm      . levETIRAcetam (KEPPRA) 250 MG tablet Take 1 tablet (250 mg total) by mouth 2 (two) times daily.  60 tablet  4  . RELION MINI PEN NEEDLES 31G X 6 MM MISC USE AS DIRECTED FOR INJECTING INSULIN  50 each  3  . warfarin (COUMADIN) 10 MG tablet 10 mg qd except 15mg   on friday       No current facility-administered medications on file prior to visit.   Allergies  Allergen Reactions  . Codeine Other (See Comments)    Unknown reaction   History   Social History  . Marital Status: Divorced    Spouse Name: N/A    Number of Children: N/A  . Years of Education: N/A   Occupational History  . Not on file.   Social History Main Topics  . Smoking status: Never Smoker   . Smokeless tobacco: Never Used  . Alcohol Use: No  . Drug Use: Not on file  . Sexual Activity: Not on file   Other Topics Concern  . Not on file   Social History Narrative   He has children.      Review of Systems  All other systems reviewed and are negative.       Objective:   Physical Exam  Vitals reviewed. Cardiovascular: Normal rate, regular rhythm and normal heart sounds.   No murmur heard. Pulmonary/Chest: Effort normal and breath sounds normal. No respiratory distress. He has no wheezes. He has no rales.  Abdominal: Soft. Bowel sounds are normal.  patient has toenail fungus in both big toes        Assessment & Plan:  1. A-fib Continue current dose of Coumadin and recheck INR in one month. I also gave patient a prescription for jublia once daily.  If this is too expensive we could try Lamisil although he would need monthly CMP due to potential liver toxicity. - PT with INR/Fingerstick

## 2013-05-08 ENCOUNTER — Other Ambulatory Visit: Payer: Self-pay | Admitting: Family Medicine

## 2013-05-11 ENCOUNTER — Other Ambulatory Visit: Payer: Self-pay | Admitting: Family Medicine

## 2013-05-11 DIAGNOSIS — I10 Essential (primary) hypertension: Secondary | ICD-10-CM

## 2013-05-11 NOTE — Telephone Encounter (Signed)
Medication refilled per protocol. 

## 2013-05-12 NOTE — Telephone Encounter (Signed)
Medication refilled per protocol. 

## 2013-05-25 ENCOUNTER — Encounter: Payer: Self-pay | Admitting: Family Medicine

## 2013-05-25 ENCOUNTER — Ambulatory Visit (INDEPENDENT_AMBULATORY_CARE_PROVIDER_SITE_OTHER): Payer: Managed Care, Other (non HMO) | Admitting: Family Medicine

## 2013-05-25 VITALS — BP 130/86 | HR 74 | Temp 97.0°F | Resp 18 | Ht 69.0 in | Wt 219.0 lb

## 2013-05-25 DIAGNOSIS — I4891 Unspecified atrial fibrillation: Secondary | ICD-10-CM

## 2013-05-25 LAB — PT WITH INR/FINGERSTICK
INR, fingerstick: 3.9 — ABNORMAL HIGH (ref 0.80–1.20)
PT, fingerstick: 46.3 seconds — ABNORMAL HIGH (ref 10.4–12.5)

## 2013-05-25 MED ORDER — TERBINAFINE HCL 250 MG PO TABS: 250.0000 mg | ORAL_TABLET | Freq: Every day | ORAL | Status: DC

## 2013-05-25 NOTE — Progress Notes (Signed)
Subjective:    Patient ID: Brian [name], male    DOB: September 08, 1955, 58 y.o.   MRN: [id number]  HPI  Patient has atrial fibrillation. He is currently on Coumadin 10 mg every day except Fridays. On Friday he takes 15 mg a day. He may have inadvertently taken an extra 5 mg on Saturday and. Therefore his INR is super therapeutic today at 2.9. He denies any bleeding or bruising. He would like to try Lamisil for onychomycosis. Past Medical History  Diagnosis Date  . Diabetes mellitus   . Hypertension   . Atrial fibrillation   . Cardiomyopathy   . Cerebrovascular accident   . Hyperlipidemia   . Seizures    Current Outpatient Prescriptions on File Prior to Visit  Medication Sig Dispense Refill  . amLODipine (NORVASC) 10 MG tablet TAKE ONE TABLET BY MOUTH ONCE DAILY  30 tablet  0  . carvedilol (COREG) 25 MG tablet Take 25 mg by mouth 2 (two) times daily with a meal.      . Efinaconazole 10 % SOLN Apply 1 application topically daily.  1 Bottle  2  . glucose blood (ONE TOUCH ULTRA TEST) test strip Pt checks BS qid Dx 250.00  200 each  5  . hydrALAZINE (APRESOLINE) 50 MG tablet Take 1 tablet (50 mg total) by mouth 3 (three) times daily.  90 tablet  3  . Insulin Glargine 100 UNIT/ML SOPN Inject 40 Units into the skin 2 (two) times daily. Inject 70 units daily with breakfast and 40 units daily at bedtime. (Levemir)      . Insulin Syringe-Needle U-100 (INSULIN SYRINGE .3CC/31GX5/16") 31G X 5/16" 0.3 ML MISC Use once daily to inject insulin.       . Insulin Syringes, Disposable, U-100 1 ML MISC by Does not apply route. ReliOn insulin syringes  Capacity - 3/53ml - gauge - 30 - 29mm      . LEVEMIR FLEXTOUCH 100 UNIT/ML Pen INJECT 70 UNITS SUBCUTANEOUSLY EVERY DAY IN THE MORNING AND INJECT 40 UNITS EVERY DAY AT BEDTIME  45 mL  2  . levETIRAcetam (KEPPRA) 250 MG tablet Take 1 tablet (250 mg total) by mouth 2 (two) times daily.  60 tablet  4  . RELION MINI PEN NEEDLES 31G X 6 MM MISC USE AS DIRECTED FOR  INJECTING INSULIN  50 each  3  . warfarin (COUMADIN) 10 MG tablet 10 mg qd except 15mg  on friday       No current facility-administered medications on file prior to visit.   Allergies  Allergen Reactions  . Codeine Other (See Comments)    Unknown reaction   History   Social History  . Marital Status: Divorced    Spouse Name: N/A    Number of Children: N/A  . Years of Education: N/A   Occupational History  . Not on file.   Social History Main Topics  . Smoking status: Never Smoker   . Smokeless tobacco: Never Used  . Alcohol Use: No  . Drug Use: Not on file  . Sexual Activity: Not on file   Other Topics Concern  . Not on file   Social History Narrative   He has children.     Review of Systems  All other systems reviewed and are negative.       Objective:   Physical Exam  Vitals reviewed. Cardiovascular: Normal rate and regular rhythm.   Pulmonary/Chest: Effort normal and breath sounds normal.  Assessment & Plan:  1. A-fib I instructed patient not to take Coumadin today. Recheck INR in 2 weeks. He will continue the same dose. I believe it is because he accidentally took Coumadin on Saturday but he is super therapeutic. I also gave patient prescription for Lamisil 250 mg by mouth daily. Gave him 30 tablets with 2 refills. I warned the patient about possible liver injury on the medication. I will check LFTs every monthly takes medication. - PT with INR/Fingerstick

## 2013-06-07 ENCOUNTER — Other Ambulatory Visit: Payer: Self-pay | Admitting: Family Medicine

## 2013-06-08 ENCOUNTER — Ambulatory Visit (INDEPENDENT_AMBULATORY_CARE_PROVIDER_SITE_OTHER): Payer: Managed Care, Other (non HMO) | Admitting: Family Medicine

## 2013-06-08 ENCOUNTER — Encounter: Payer: Self-pay | Admitting: Family Medicine

## 2013-06-08 VITALS — BP 160/92 | HR 80 | Temp 98.0°F | Resp 16 | Ht 69.0 in | Wt 215.0 lb

## 2013-06-08 DIAGNOSIS — I4891 Unspecified atrial fibrillation: Secondary | ICD-10-CM

## 2013-06-08 LAB — PT WITH INR/FINGERSTICK
INR, fingerstick: 1.8 — ABNORMAL HIGH (ref 0.80–1.20)
PT, fingerstick: 21.7 seconds — ABNORMAL HIGH (ref 10.4–12.5)

## 2013-06-08 MED ORDER — WARFARIN SODIUM 10 MG PO TABS: 10.0000 mg | ORAL_TABLET | Freq: Two times a day (BID) | ORAL | Status: DC

## 2013-06-08 MED ORDER — WARFARIN SODIUM 10 MG PO TABS: ORAL_TABLET | ORAL | Status: DC

## 2013-06-08 MED ORDER — INSULIN GLARGINE 100 UNIT/ML SOLOSTAR PEN: 40.0000 [IU] | PEN_INJECTOR | Freq: Two times a day (BID) | SUBCUTANEOUS | Status: DC

## 2013-06-08 NOTE — Progress Notes (Signed)
Subjective:    Patient ID: Brian [name], male    DOB: 05/30/1955, 58 y.o.   MRN: [id number]  HPI  05/25/13 Patient has atrial fibrillation. He is currently on Coumadin 10 mg every day except Fridays. On Friday he takes 15 mg a day. He may have inadvertently taken an extra 5 mg on Saturday and. Therefore his INR is super therapeutic today at 2.9. He denies any bleeding or bruising. He would like to try Lamisil for onychomycosis. The plan at that time: 1. A-fib I instructed patient not to take Coumadin today. Recheck INR in 2 weeks. He will continue the same dose. I believe it is because he accidentally took Coumadin on Saturday but he is super therapeutic. I also gave patient prescription for Lamisil 250 mg by mouth daily. Gave him 30 tablets with 2 refills. I warned the patient about possible liver injury on the medication. I will check LFTs every monthly takes medication. - PT with INR/Fingerstick  06/08/13 Here for follow up.  He missed his last 2 doses of Coumadin. His INR is subtherapeutic today at 1.8. However missing 2 doses would explain that. Otherwise he is doing well without concerns. Past Medical History  Diagnosis Date  . Diabetes mellitus   . Hypertension   . Atrial fibrillation   . Cardiomyopathy   . Cerebrovascular accident   . Hyperlipidemia   . Seizures    Current Outpatient Prescriptions on File Prior to Visit  Medication Sig Dispense Refill  . amLODipine (NORVASC) 10 MG tablet TAKE ONE TABLET BY MOUTH ONCE DAILY  30 tablet  0  . carvedilol (COREG) 25 MG tablet Take 25 mg by mouth 2 (two) times daily with a meal.      . Efinaconazole 10 % SOLN Apply 1 application topically daily.  1 Bottle  2  . glucose blood (ONE TOUCH ULTRA TEST) test strip Pt checks BS qid Dx 250.00  200 each  5  . hydrALAZINE (APRESOLINE) 50 MG tablet Take 1 tablet (50 mg total) by mouth 3 (three) times daily.  90 tablet  3  . Insulin Glargine 100 UNIT/ML SOPN Inject 40 Units into the skin 2  (two) times daily. Inject 70 units daily with breakfast and 40 units daily at bedtime. (Levemir)      . Insulin Syringe-Needle U-100 (INSULIN SYRINGE .3CC/31GX5/16") 31G X 5/16" 0.3 ML MISC Use once daily to inject insulin.       . Insulin Syringes, Disposable, U-100 1 ML MISC by Does not apply route. ReliOn insulin syringes  Capacity - 3/20ml - gauge - 30 - 61mm      . LEVEMIR FLEXTOUCH 100 UNIT/ML Pen INJECT 70 UNITS SUBCUTANEOUSLY EVERY DAY IN THE MORNING AND INJECT 40 UNITS EVERY DAY AT BEDTIME  45 mL  2  . levETIRAcetam (KEPPRA) 250 MG tablet Take 1 tablet (250 mg total) by mouth 2 (two) times daily.  60 tablet  4  . RELION MINI PEN NEEDLES 31G X 6 MM MISC USE AS DIRECTED FOR INJECTING INSULIN  50 each  3  . terbinafine (LAMISIL) 250 MG tablet Take 1 tablet (250 mg total) by mouth daily.  30 tablet  2  . warfarin (COUMADIN) 10 MG tablet 10 mg qd except 15mg  on friday       No current facility-administered medications on file prior to visit.   Allergies  Allergen Reactions  . Codeine Other (See Comments)    Unknown reaction   History   Social History  .  Marital Status: Divorced    Spouse Name: N/A    Number of Children: N/A  . Years of Education: N/A   Occupational History  . Not on file.   Social History Main Topics  . Smoking status: Never Smoker   . Smokeless tobacco: Never Used  . Alcohol Use: No  . Drug Use: Not on file  . Sexual Activity: Not on file   Other Topics Concern  . Not on file   Social History Narrative   He has children.     Review of Systems  All other systems reviewed and are negative.      Objective:   Physical Exam  Vitals reviewed. Cardiovascular: Normal rate and regular rhythm.   Pulmonary/Chest: Effort normal and breath sounds normal.          Assessment & Plan:   1. A-fib Resume his previous dose of Coumadin and recheck INR in 1 month. - PT with INR/Fingerstick

## 2013-06-08 NOTE — Addendum Note (Signed)
Addended by: Shary Decamp B on: 06/08/2013 11:41 AM   Modules accepted: Orders

## 2013-06-19 ENCOUNTER — Other Ambulatory Visit: Payer: Self-pay | Admitting: Family Medicine

## 2013-06-19 DIAGNOSIS — I10 Essential (primary) hypertension: Secondary | ICD-10-CM

## 2013-06-19 NOTE — Telephone Encounter (Signed)
Medication refilled per protocol. 

## 2013-06-29 ENCOUNTER — Other Ambulatory Visit: Payer: Managed Care, Other (non HMO)

## 2013-06-29 DIAGNOSIS — R748 Abnormal levels of other serum enzymes: Secondary | ICD-10-CM

## 2013-06-29 LAB — HEPATIC FUNCTION PANEL
ALT: 20 U/L (ref 0–53)
AST: 16 U/L (ref 0–37)
Albumin: 4.1 g/dL (ref 3.5–5.2)
Alkaline Phosphatase: 62 U/L (ref 39–117)
BILIRUBIN DIRECT: 0.1 mg/dL (ref 0.0–0.3)
BILIRUBIN INDIRECT: 0.3 mg/dL (ref 0.2–1.2)
Total Bilirubin: 0.4 mg/dL (ref 0.2–1.2)
Total Protein: 6.6 g/dL (ref 6.0–8.3)

## 2013-07-07 ENCOUNTER — Ambulatory Visit (INDEPENDENT_AMBULATORY_CARE_PROVIDER_SITE_OTHER): Payer: Managed Care, Other (non HMO) | Admitting: Family Medicine

## 2013-07-07 ENCOUNTER — Encounter: Payer: Self-pay | Admitting: Family Medicine

## 2013-07-07 VITALS — BP 104/68 | HR 68 | Temp 97.2°F | Resp 16 | Ht 69.0 in | Wt 214.0 lb

## 2013-07-07 DIAGNOSIS — I4891 Unspecified atrial fibrillation: Secondary | ICD-10-CM

## 2013-07-07 LAB — PT WITH INR/FINGERSTICK
INR, fingerstick: 3 — ABNORMAL HIGH (ref 0.80–1.20)
PT, fingerstick: 35.5 seconds — ABNORMAL HIGH (ref 10.4–12.5)

## 2013-07-07 NOTE — Progress Notes (Signed)
Subjective:    Patient ID: Brian [name], male    DOB: [birth year], 58 y.o.   MRN: [id number]  HPI  05/25/13 Patient has atrial fibrillation. He is currently on Coumadin 10 mg every day except Fridays. On Friday he takes 15 mg a day. He may have inadvertently taken an extra 5 mg on Saturday and. Therefore his INR is super therapeutic today at 2.9. He denies any bleeding or bruising. He would like to try Lamisil for onychomycosis. The plan at that time: 1. A-fib I instructed patient not to take Coumadin today. Recheck INR in 2 weeks. He will continue the same dose. I believe it is because he accidentally took Coumadin on Saturday but he is super therapeutic. I also gave patient prescription for Lamisil 250 mg by mouth daily. Gave him 30 tablets with 2 refills. I warned the patient about possible liver injury on the medication. I will check LFTs every monthly takes medication. - PT with INR/Fingerstick  06/08/13 Here for follow up.  He missed his last 2 doses of Coumadin. His INR is subtherapeutic today at 1.8. However missing 2 doses would explain that. Otherwise he is doing well without concerns.  At that time, my plan was: 1. A-fib Resume his previous dose of Coumadin and recheck INR in 1 month. - PT with INR/Fingerstick  07/07/13 Patient is here today for recheck of his INR he is currently taking Coumadin 10 mg every day except Friday. On Friday he takes 15 mg. His INR today is therapeutic at 3.0. His blood pressures well controlled at 104/68. His blood sugars ranging between 102 100 on Levemir 70 units in the morning 30 units in the evening. He denies any hypoglycemia. Past Medical History  Diagnosis Date  . Diabetes mellitus   . Hypertension   . Atrial fibrillation   . Cardiomyopathy   . Cerebrovascular accident   . Hyperlipidemia   . Seizures    Current Outpatient Prescriptions on File Prior to Visit  Medication Sig Dispense Refill  . amLODipine (NORVASC) 10 MG tablet TAKE ONE  TABLET BY MOUTH ONCE DAILY  30 tablet  2  . carvedilol (COREG) 25 MG tablet Take 25 mg by mouth 2 (two) times daily with a meal.      . glucose blood (ONE TOUCH ULTRA TEST) test strip Pt checks BS qid Dx 250.00  200 each  5  . hydrALAZINE (APRESOLINE) 50 MG tablet Take 1 tablet (50 mg total) by mouth 3 (three) times daily.  90 tablet  3  . Insulin Syringe-Needle U-100 (INSULIN SYRINGE .3CC/31GX5/16") 31G X 5/16" 0.3 ML MISC Use once daily to inject insulin.       . Insulin Syringes, Disposable, U-100 1 ML MISC by Does not apply route. ReliOn insulin syringes  Capacity - 3/63ml - gauge - 30 - 68mm      . LEVEMIR FLEXTOUCH 100 UNIT/ML Pen INJECT 70 UNITS SUBCUTANEOUSLY EVERY DAY IN THE MORNING AND INJECT 40 UNITS EVERY DAY AT BEDTIME  45 mL  2  . levETIRAcetam (KEPPRA) 250 MG tablet Take 1 tablet (250 mg total) by mouth 2 (two) times daily.  60 tablet  4  . RELION MINI PEN NEEDLES 31G X 6 MM MISC USE AS DIRECTED FOR INJECTING INSULIN  50 each  3  . terbinafine (LAMISIL) 250 MG tablet Take 1 tablet (250 mg total) by mouth daily.  30 tablet  2  . warfarin (COUMADIN) 10 MG tablet 10 mg qd except 15mg  on friday  45 tablet  5   No current facility-administered medications on file prior to visit.   Allergies  Allergen Reactions  . Codeine Other (See Comments)    Unknown reaction   History   Social History  . Marital Status: Divorced    Spouse Name: N/A    Number of Children: N/A  . Years of Education: N/A   Occupational History  . Not on file.   Social History Main Topics  . Smoking status: Never Smoker   . Smokeless tobacco: Never Used  . Alcohol Use: No  . Drug Use: Not on file  . Sexual Activity: Not on file   Other Topics Concern  . Not on file   Social History Narrative   He has children.     Review of Systems  All other systems reviewed and are negative.      Objective:   Physical Exam  Vitals reviewed. Cardiovascular: Normal rate and regular rhythm.     Pulmonary/Chest: Effort normal and breath sounds normal.          Assessment & Plan:  1. A-fib INR is therapeutic. No change in Coumadin dose. Recheck in one month. - PT with INR/Fingerstick

## 2013-07-29 ENCOUNTER — Other Ambulatory Visit: Payer: Self-pay | Admitting: Family Medicine

## 2013-07-29 ENCOUNTER — Other Ambulatory Visit: Payer: Managed Care, Other (non HMO)

## 2013-07-29 DIAGNOSIS — Z79899 Other long term (current) drug therapy: Secondary | ICD-10-CM

## 2013-07-29 LAB — HEPATIC FUNCTION PANEL
ALBUMIN: 4.3 g/dL (ref 3.5–5.2)
ALK PHOS: 69 U/L (ref 39–117)
ALT: 18 U/L (ref 0–53)
AST: 18 U/L (ref 0–37)
BILIRUBIN INDIRECT: 0.4 mg/dL (ref 0.2–1.2)
BILIRUBIN TOTAL: 0.5 mg/dL (ref 0.2–1.2)
Bilirubin, Direct: 0.1 mg/dL (ref 0.0–0.3)
TOTAL PROTEIN: 6.9 g/dL (ref 6.0–8.3)

## 2013-07-29 MED ORDER — HYDRALAZINE HCL 50 MG PO TABS: 50.0000 mg | ORAL_TABLET | Freq: Three times a day (TID) | ORAL | Status: DC

## 2013-07-31 ENCOUNTER — Encounter: Payer: Self-pay | Admitting: Family Medicine

## 2013-07-31 ENCOUNTER — Ambulatory Visit (INDEPENDENT_AMBULATORY_CARE_PROVIDER_SITE_OTHER): Payer: Managed Care, Other (non HMO) | Admitting: Family Medicine

## 2013-07-31 VITALS — BP 118/84 | HR 76 | Temp 97.4°F | Resp 18 | Ht 69.0 in | Wt 213.0 lb

## 2013-07-31 DIAGNOSIS — I4891 Unspecified atrial fibrillation: Secondary | ICD-10-CM

## 2013-07-31 LAB — BASIC METABOLIC PANEL
BUN: 18 mg/dL (ref 6–23)
CALCIUM: 9.1 mg/dL (ref 8.4–10.5)
CO2: 20 meq/L (ref 19–32)
Chloride: 101 mEq/L (ref 96–112)
Creat: 1.36 mg/dL — ABNORMAL HIGH (ref 0.50–1.35)
GLUCOSE: 189 mg/dL — AB (ref 70–99)
POTASSIUM: 3.6 meq/L (ref 3.5–5.3)
Sodium: 136 mEq/L (ref 135–145)

## 2013-07-31 LAB — PT WITH INR/FINGERSTICK
INR FINGERSTICK: 3.6 — AB (ref 0.80–1.20)
PT, fingerstick: 43.8 seconds — ABNORMAL HIGH (ref 10.4–12.5)

## 2013-07-31 NOTE — Progress Notes (Signed)
Subjective:    Patient ID: Brian [name], male    DOB: 1955/10/04, 58 y.o.   MRN: [id number]  HPI  05/25/13 Patient has atrial fibrillation. He is currently on Coumadin 10 mg every day except Fridays. On Friday he takes 15 mg a day. He may have inadvertently taken an extra 5 mg on Saturday and. Therefore his INR is super therapeutic today at 2.9. He denies any bleeding or bruising. He would like to try Lamisil for onychomycosis. The plan at that time: 1. A-fib I instructed patient not to take Coumadin today. Recheck INR in 2 weeks. He will continue the same dose. I believe it is because he accidentally took Coumadin on Saturday but he is super therapeutic. I also gave patient prescription for Lamisil 250 mg by mouth daily. Gave him 30 tablets with 2 refills. I warned the patient about possible liver injury on the medication. I will check LFTs every monthly takes medication. - PT with INR/Fingerstick  06/08/13 Here for follow up.  He missed his last 2 doses of Coumadin. His INR is subtherapeutic today at 1.8. However missing 2 doses would explain that. Otherwise he is doing well without concerns.  At that time, my plan was: 1. A-fib Resume his previous dose of Coumadin and recheck INR in 1 month. - PT with INR/Fingerstick  07/07/13 Patient is here today for recheck of his INR he is currently taking Coumadin 10 mg every day except Friday. On Friday he takes 15 mg. His INR today is therapeutic at 3.0. His blood pressures well controlled at 104/68. His blood sugars ranging between 102 100 on Levemir 70 units in the morning 30 units in the evening. He denies any hypoglycemia.  At that time, my plan was: 1. A-fib INR is therapeutic. No change in Coumadin dose. Recheck in one month. - PT with INR/Fingerstick  07/31/13 Patient is here for a recheck of his INR/PT for his atrial fibrillation.  INR today is 3.6 and supratherapeutic. He is currently taking 10 mg of Coumadin everyday except Fridays. On  Friday he takes 15 mg. He denies any bleeding or bruising. His blood sugars are ranging 03/11/1940 in the morning and 140-160 in the evening. He is current taking 70 units of insulin in the morning and 40 units of insulin in the evening and seems to be working well. He is overdue for hemoglobin A1c Past Medical History  Diagnosis Date  . Diabetes mellitus   . Hypertension   . Atrial fibrillation   . Cardiomyopathy   . Cerebrovascular accident   . Hyperlipidemia   . Seizures    Current Outpatient Prescriptions on File Prior to Visit  Medication Sig Dispense Refill  . amLODipine (NORVASC) 10 MG tablet TAKE ONE TABLET BY MOUTH ONCE DAILY  30 tablet  2  . carvedilol (COREG) 25 MG tablet Take 25 mg by mouth 2 (two) times daily with a meal.      . glucose blood (ONE TOUCH ULTRA TEST) test strip Pt checks BS qid Dx 250.00  200 each  5  . hydrALAZINE (APRESOLINE) 50 MG tablet Take 1 tablet (50 mg total) by mouth 3 (three) times daily.  90 tablet  11  . Insulin Syringe-Needle U-100 (INSULIN SYRINGE .3CC/31GX5/16") 31G X 5/16" 0.3 ML MISC Use once daily to inject insulin.       . Insulin Syringes, Disposable, U-100 1 ML MISC by Does not apply route. ReliOn insulin syringes  Capacity - 3/22ml - gauge - 30 -  39mm      . LEVEMIR FLEXTOUCH 100 UNIT/ML Pen INJECT 70 UNITS SUBCUTANEOUSLY EVERY DAY IN THE MORNING AND INJECT 40 UNITS EVERY DAY AT BEDTIME  45 mL  2  . levETIRAcetam (KEPPRA) 250 MG tablet Take 1 tablet (250 mg total) by mouth 2 (two) times daily.  60 tablet  4  . RELION MINI PEN NEEDLES 31G X 6 MM MISC USE AS DIRECTED FOR INJECTING INSULIN  50 each  3  . terbinafine (LAMISIL) 250 MG tablet Take 1 tablet (250 mg total) by mouth daily.  30 tablet  2  . warfarin (COUMADIN) 10 MG tablet 10 mg qd except 15mg  on friday  45 tablet  5   No current facility-administered medications on file prior to visit.   Allergies  Allergen Reactions  . Codeine Other (See Comments)    Unknown reaction   History    Social History  . Marital Status: Divorced    Spouse Name: N/A    Number of Children: N/A  . Years of Education: N/A   Occupational History  . Not on file.   Social History Main Topics  . Smoking status: Never Smoker   . Smokeless tobacco: Never Used  . Alcohol Use: No  . Drug Use: Not on file  . Sexual Activity: Not on file   Other Topics Concern  . Not on file   Social History Narrative   He has children.     Review of Systems  All other systems reviewed and are negative.      Objective:   Physical Exam  Vitals reviewed. Cardiovascular: Normal rate and regular rhythm.   Pulmonary/Chest: Effort normal and breath sounds normal.          Assessment & Plan:  1. A-fib Change Coumadin to 10 mg every day and recheck INR in 4 weeks.  His next appointment I like to get a CMP, fasting lipid panel, and hemoglobin A1c. - PT with INR/Fingerstick

## 2013-08-04 ENCOUNTER — Encounter: Payer: Self-pay | Admitting: Family Medicine

## 2013-08-21 ENCOUNTER — Other Ambulatory Visit: Payer: Self-pay | Admitting: Family Medicine

## 2013-08-26 ENCOUNTER — Other Ambulatory Visit: Payer: Self-pay | Admitting: Family Medicine

## 2013-08-26 ENCOUNTER — Other Ambulatory Visit: Payer: Managed Care, Other (non HMO)

## 2013-08-26 DIAGNOSIS — E1165 Type 2 diabetes mellitus with hyperglycemia: Principal | ICD-10-CM

## 2013-08-26 DIAGNOSIS — IMO0001 Reserved for inherently not codable concepts without codable children: Secondary | ICD-10-CM

## 2013-08-26 DIAGNOSIS — E785 Hyperlipidemia, unspecified: Secondary | ICD-10-CM

## 2013-08-26 LAB — LIPID PANEL
CHOLESTEROL: 234 mg/dL — AB (ref 0–200)
HDL: 28 mg/dL — AB (ref >39)
TRIGLYCERIDES: 680 mg/dL — AB (ref <150)
Total CHOL/HDL Ratio: 8.4 Ratio

## 2013-08-26 NOTE — Telephone Encounter (Signed)
?   OK to Refill - has been on x 3 months

## 2013-08-27 NOTE — Telephone Encounter (Signed)
[place] for 1 more month.

## 2013-08-29 ENCOUNTER — Other Ambulatory Visit: Payer: Self-pay | Admitting: *Deleted

## 2013-08-29 DIAGNOSIS — E1165 Type 2 diabetes mellitus with hyperglycemia: Principal | ICD-10-CM

## 2013-08-29 DIAGNOSIS — IMO0001 Reserved for inherently not codable concepts without codable children: Secondary | ICD-10-CM

## 2013-08-29 DIAGNOSIS — E785 Hyperlipidemia, unspecified: Secondary | ICD-10-CM

## 2013-08-29 DIAGNOSIS — I10 Essential (primary) hypertension: Secondary | ICD-10-CM

## 2013-08-31 ENCOUNTER — Encounter: Payer: Self-pay | Admitting: Family Medicine

## 2013-08-31 ENCOUNTER — Ambulatory Visit (INDEPENDENT_AMBULATORY_CARE_PROVIDER_SITE_OTHER): Payer: Managed Care, Other (non HMO) | Admitting: Family Medicine

## 2013-08-31 VITALS — BP 98/60 | HR 78 | Temp 97.1°F | Resp 16 | Ht 69.0 in | Wt 220.0 lb

## 2013-08-31 DIAGNOSIS — I4891 Unspecified atrial fibrillation: Secondary | ICD-10-CM

## 2013-08-31 LAB — PT WITH INR/FINGERSTICK
INR, fingerstick: 2.8 — ABNORMAL HIGH (ref 0.80–1.20)
PT, fingerstick: 33.9 seconds — ABNORMAL HIGH (ref 10.4–12.5)

## 2013-08-31 MED ORDER — FENOFIBRATE 160 MG PO TABS: 160.0000 mg | ORAL_TABLET | Freq: Every day | ORAL | Status: DC

## 2013-08-31 NOTE — Progress Notes (Signed)
   Subjective:    Patient ID: Brian [name], male    DOB: Sep 03, 1955, 58 y.o.   MRN: [id number]  HPI Patient's INR is 2.8 and therapeutic. He denies any bleeding or bruising. He does complain of fatigue and his blood pressure is low today at 98/60. He denies any dizziness or syncope. He denies any chest pain or shortness of breath. He also has a history of hypogonadism. We have unsuccessfully treated this time L. fundus. Furthermore his cholesterol is extremely elevated. Therefore I have recommended that the patient discontinue Lamisil and begin fenofibrate 160 mg by mouth daily. Past Medical History  Diagnosis Date  . Diabetes mellitus   . Hypertension   . Atrial fibrillation   . Cardiomyopathy   . Cerebrovascular accident   . Hyperlipidemia   . Seizures    Current Outpatient Prescriptions on File Prior to Visit  Medication Sig Dispense Refill  . amLODipine (NORVASC) 10 MG tablet TAKE ONE TABLET BY MOUTH ONCE DAILY  30 tablet  2  . carvedilol (COREG) 25 MG tablet Take 25 mg by mouth 2 (two) times daily with a meal.      . glucose blood (ONE TOUCH ULTRA TEST) test strip Pt checks BS qid Dx 250.00  200 each  5  . hydrALAZINE (APRESOLINE) 50 MG tablet Take 1 tablet (50 mg total) by mouth 3 (three) times daily.  90 tablet  11  . Insulin Detemir (LEVEMIR FLEXTOUCH) 100 UNIT/ML Pen INJECT 70 UNITS SUBCUTANEOUSLY EVERY DAY IN THE MORNING AND INJECT 40 UNITS EVERY DAY AT BEDTIME      . Insulin Syringe-Needle U-100 (INSULIN SYRINGE .3CC/31GX5/16") 31G X 5/16" 0.3 ML MISC Use once daily to inject insulin.       . Insulin Syringes, Disposable, U-100 1 ML MISC by Does not apply route. ReliOn insulin syringes  Capacity - 3/53ml - gauge - 30 - 39mm      . levETIRAcetam (KEPPRA) 250 MG tablet Take 1 tablet (250 mg total) by mouth 2 (two) times daily.  60 tablet  4  . RELION MINI PEN NEEDLES 31G X 6 MM MISC USE AS DIRECTED FOR INJECTING INSULIN  50 each  3   No current facility-administered medications  on file prior to visit.   Allergies  Allergen Reactions  . Codeine Other (See Comments)    Unknown reaction   History   Social History  . Marital Status: Divorced    Spouse Name: N/A    Number of Children: N/A  . Years of Education: N/A   Occupational History  . Not on file.   Social History Main Topics  . Smoking status: Never Smoker   . Smokeless tobacco: Never Used  . Alcohol Use: No  . Drug Use: Not on file  . Sexual Activity: Not on file   Other Topics Concern  . Not on file   Social History Narrative   He has children.      Review of Systems  All other systems reviewed and are negative.      Objective:   Physical Exam  Cardiovascular: Normal rate and normal heart sounds.  An irregularly irregular rhythm present.  No murmur heard. Pulmonary/Chest: Effort normal and breath sounds normal. No respiratory distress. He has no wheezes. He has no rales.  Abdominal: Soft. Bowel sounds are normal.  Musculoskeletal: He exhibits no edema.          Assessment & Plan:

## 2013-10-02 ENCOUNTER — Ambulatory Visit (INDEPENDENT_AMBULATORY_CARE_PROVIDER_SITE_OTHER): Payer: Managed Care, Other (non HMO) | Admitting: Family Medicine

## 2013-10-02 ENCOUNTER — Encounter: Payer: Self-pay | Admitting: Family Medicine

## 2013-10-02 VITALS — BP 126/74 | HR 64 | Temp 97.6°F | Resp 16 | Ht 69.0 in | Wt 217.0 lb

## 2013-10-02 DIAGNOSIS — I4891 Unspecified atrial fibrillation: Secondary | ICD-10-CM

## 2013-10-02 LAB — PT WITH INR/FINGERSTICK
INR FINGERSTICK: 2 — AB (ref 0.80–1.20)
PT, fingerstick: 23.8 seconds — ABNORMAL HIGH (ref 10.4–12.5)

## 2013-10-02 NOTE — Progress Notes (Signed)
Subjective:    Patient ID: Brian [name], male    DOB: 1955/08/10, 58 y.o.   MRN: [id number]  HPI 08/31/13 Patient's INR is 2.8 and therapeutic. He denies any bleeding or bruising. He does complain of fatigue and his blood pressure is low today at 98/60. He denies any dizziness or syncope. He denies any chest pain or shortness of breath. He also has a history of hypogonadism. We have unsuccessfully treated his toenail fungus. Furthermore his cholesterol is extremely elevated. Therefore I have recommended that the patient discontinue Lamisil and begin fenofibrate 160 mg by mouth daily.  I advised him to continue his current dose of coumadin and recheck here in 1 month.  10/02/13 He is here today for INR check.  INR is therapeutic at 2.0 although the patient has missed her doses of Coumadin over the last 2 weeks. Past Medical History  Diagnosis Date  . Diabetes mellitus   . Hypertension   . Atrial fibrillation   . Cardiomyopathy   . Cerebrovascular accident   . Hyperlipidemia   . Seizures    Current Outpatient Prescriptions on File Prior to Visit  Medication Sig Dispense Refill  . amLODipine (NORVASC) 10 MG tablet TAKE ONE TABLET BY MOUTH ONCE DAILY  30 tablet  2  . aspirin 81 MG tablet Take 81 mg by mouth daily.      . carvedilol (COREG) 25 MG tablet Take 25 mg by mouth 2 (two) times daily with a meal.      . fenofibrate 160 MG tablet Take 1 tablet (160 mg total) by mouth daily.  30 tablet  5  . glucose blood (ONE TOUCH ULTRA TEST) test strip Pt checks BS qid Dx 250.00  200 each  5  . hydrALAZINE (APRESOLINE) 50 MG tablet Take 1 tablet (50 mg total) by mouth 3 (three) times daily.  90 tablet  11  . Insulin Detemir (LEVEMIR FLEXTOUCH) 100 UNIT/ML Pen INJECT 70 UNITS SUBCUTANEOUSLY EVERY DAY IN THE MORNING AND INJECT 40 UNITS EVERY DAY AT BEDTIME      . Insulin Syringe-Needle U-100 (INSULIN SYRINGE .3CC/31GX5/16") 31G X 5/16" 0.3 ML MISC Use once daily to inject insulin.       . Insulin  Syringes, Disposable, U-100 1 ML MISC by Does not apply route. ReliOn insulin syringes  Capacity - 3/71ml - gauge - 30 - 29mm      . levETIRAcetam (KEPPRA) 250 MG tablet Take 1 tablet (250 mg total) by mouth 2 (two) times daily.  60 tablet  4  . RELION MINI PEN NEEDLES 31G X 6 MM MISC USE AS DIRECTED FOR INJECTING INSULIN  50 each  3  . warfarin (COUMADIN) 10 MG tablet 10 mg qd       No current facility-administered medications on file prior to visit.   Allergies  Allergen Reactions  . Codeine Other (See Comments)    Unknown reaction   History   Social History  . Marital Status: Divorced    Spouse Name: N/A    Number of Children: N/A  . Years of Education: N/A   Occupational History  . Not on file.   Social History Main Topics  . Smoking status: Never Smoker   . Smokeless tobacco: Never Used  . Alcohol Use: No  . Drug Use: Not on file  . Sexual Activity: Not on file   Other Topics Concern  . Not on file   Social History Narrative   He has children.  Review of Systems  All other systems reviewed and are negative.      Objective:   Physical Exam  Cardiovascular: Normal rate and normal heart sounds.  An irregularly irregular rhythm present.  No murmur heard. Pulmonary/Chest: Effort normal and breath sounds normal. No respiratory distress. He has no wheezes. He has no rales.  Abdominal: Soft. Bowel sounds are normal.  Musculoskeletal: He exhibits no edema.          Assessment & Plan:  1. A-fib Continue current dose of Coumadin. I recommended the patient be compliant with his medication and not miss doses of Coumadin. Recheck INR in 4 weeks. - PT with INR/Fingerstick

## 2013-10-06 ENCOUNTER — Other Ambulatory Visit: Payer: Self-pay | Admitting: Family Medicine

## 2013-11-02 ENCOUNTER — Ambulatory Visit (INDEPENDENT_AMBULATORY_CARE_PROVIDER_SITE_OTHER): Payer: Managed Care, Other (non HMO) | Admitting: Family Medicine

## 2013-11-02 ENCOUNTER — Encounter: Payer: Self-pay | Admitting: Family Medicine

## 2013-11-02 VITALS — BP 122/68 | HR 68 | Temp 97.9°F | Resp 18 | Ht 69.0 in | Wt 217.0 lb

## 2013-11-02 DIAGNOSIS — I4891 Unspecified atrial fibrillation: Secondary | ICD-10-CM

## 2013-11-02 LAB — PT WITH INR/FINGERSTICK
INR, fingerstick: 2.2 — ABNORMAL HIGH (ref 0.80–1.20)
PT, fingerstick: 26.5 seconds — ABNORMAL HIGH (ref 10.4–12.5)

## 2013-11-02 NOTE — Progress Notes (Signed)
Subjective:    Patient ID: Brian [name], male    DOB: [birth year], 58 y.o.   MRN: [id number]  HPI 08/31/13 Patient's INR is 2.8 and therapeutic. He denies any bleeding or bruising. He does complain of fatigue and his blood pressure is low today at 98/60. He denies any dizziness or syncope. He denies any chest pain or shortness of breath. He also has a history of hypogonadism. We have unsuccessfully treated his toenail fungus. Furthermore his cholesterol is extremely elevated. Therefore I have recommended that the patient discontinue Lamisil and begin fenofibrate 160 mg by mouth daily.  I advised him to continue his current dose of coumadin and recheck here in 1 month.  10/02/13 He is here today for INR check.  INR is therapeutic at 2.0 although the patient has missed 2-3 doses of Coumadin over the last 2 weeks.  At that time, my plan was: 1. A-fib Continue current dose of Coumadin. I recommended the patient be compliant with his medication and not miss doses of Coumadin. Recheck INR in 4 weeks. - PT with INR/Fingerstick  11/02/13 He is here today to recheck his INR.  He denies any bleeding or bruising.  He is taking 10 mg everyday and has not missed any doses.   His INR is therapeutic at 2.2.  His blood sugars range between 101 120. He denies any hypoglycemia. His blood pressure is well controlled at 122/68. He denies any chest pain shortness of breath or dyspnea on exertion.  Patient is due for fasting lab work in October including a hemoglobin A1c and fasting lipid panel.  He is due for his flu shot today. Past Medical History  Diagnosis Date  . Diabetes mellitus   . Hypertension   . Atrial fibrillation   . Cardiomyopathy   . Cerebrovascular accident   . Hyperlipidemia   . Seizures    Current Outpatient Prescriptions on File Prior to Visit  Medication Sig Dispense Refill  . amLODipine (NORVASC) 10 MG tablet TAKE ONE TABLET BY MOUTH ONCE DAILY  30 tablet  11  . aspirin 81 MG tablet  Take 81 mg by mouth daily.      . carvedilol (COREG) 25 MG tablet Take 25 mg by mouth 2 (two) times daily with a meal.      . fenofibrate 160 MG tablet Take 1 tablet (160 mg total) by mouth daily.  30 tablet  5  . glucose blood (ONE TOUCH ULTRA TEST) test strip Pt checks BS qid Dx 250.00  200 each  5  . hydrALAZINE (APRESOLINE) 50 MG tablet Take 1 tablet (50 mg total) by mouth 3 (three) times daily.  90 tablet  11  . Insulin Detemir (LEVEMIR FLEXTOUCH) 100 UNIT/ML Pen INJECT 70 UNITS SUBCUTANEOUSLY EVERY DAY IN THE MORNING AND INJECT 40 UNITS EVERY DAY AT BEDTIME      . Insulin Syringe-Needle U-100 (INSULIN SYRINGE .3CC/31GX5/16") 31G X 5/16" 0.3 ML MISC Use once daily to inject insulin.       . Insulin Syringes, Disposable, U-100 1 ML MISC by Does not apply route. ReliOn insulin syringes  Capacity - 3/28ml - gauge - 30 - 58mm      . levETIRAcetam (KEPPRA) 250 MG tablet Take 1 tablet (250 mg total) by mouth 2 (two) times daily.  60 tablet  4  . RELION MINI PEN NEEDLES 31G X 6 MM MISC USE AS DIRECTED FOR INJECTING INSULIN  50 each  3  . warfarin (COUMADIN) 10 MG tablet 10  mg qd       No current facility-administered medications on file prior to visit.   Allergies  Allergen Reactions  . Codeine Other (See Comments)    Unknown reaction   History   Social History  . Marital Status: Divorced    Spouse Name: N/A    Number of Children: N/A  . Years of Education: N/A   Occupational History  . Not on file.   Social History Main Topics  . Smoking status: Never Smoker   . Smokeless tobacco: Never Used  . Alcohol Use: No  . Drug Use: Not on file  . Sexual Activity: Not on file   Other Topics Concern  . Not on file   Social History Narrative   He has children.      Review of Systems  All other systems reviewed and are negative.      Objective:   Physical Exam  Cardiovascular: Normal rate and normal heart sounds.  An irregularly irregular rhythm present.  No murmur  heard. Pulmonary/Chest: Effort normal and breath sounds normal. No respiratory distress. He has no wheezes. He has no rales.  Abdominal: Soft. Bowel sounds are normal.  Musculoskeletal: He exhibits no edema.          Assessment & Plan:   A-fib - Plan: PT with INR/Fingerstick   Patient's Coumadin is therapeutic.  Continue coumadin 10 mg every day. Recheck in one month. In one month he will be due for fasting lab work to assess his diabetes as well as hyperlipidemia.

## 2013-11-25 ENCOUNTER — Other Ambulatory Visit: Payer: Self-pay | Admitting: Family Medicine

## 2013-11-26 ENCOUNTER — Other Ambulatory Visit: Payer: Self-pay | Admitting: Family Medicine

## 2013-11-26 MED ORDER — INSULIN DETEMIR 100 UNIT/ML FLEXPEN: PEN_INJECTOR | SUBCUTANEOUS | Status: DC

## 2013-11-26 NOTE — Telephone Encounter (Signed)
Med sent to pharm 

## 2013-12-03 ENCOUNTER — Ambulatory Visit: Payer: Managed Care, Other (non HMO) | Admitting: Family Medicine

## 2013-12-10 ENCOUNTER — Encounter: Payer: Self-pay | Admitting: Family Medicine

## 2013-12-10 ENCOUNTER — Ambulatory Visit (INDEPENDENT_AMBULATORY_CARE_PROVIDER_SITE_OTHER): Payer: Managed Care, Other (non HMO) | Admitting: Family Medicine

## 2013-12-10 VITALS — BP 110/70 | HR 80 | Temp 97.6°F | Resp 20 | Ht 69.0 in | Wt 220.0 lb

## 2013-12-10 DIAGNOSIS — E785 Hyperlipidemia, unspecified: Secondary | ICD-10-CM

## 2013-12-10 DIAGNOSIS — E119 Type 2 diabetes mellitus without complications: Secondary | ICD-10-CM

## 2013-12-10 DIAGNOSIS — I4891 Unspecified atrial fibrillation: Secondary | ICD-10-CM

## 2013-12-10 DIAGNOSIS — Z794 Long term (current) use of insulin: Secondary | ICD-10-CM

## 2013-12-10 LAB — PT WITH INR/FINGERSTICK
INR FINGERSTICK: 2.1 — AB (ref 0.80–1.20)
PT FINGERSTICK: 24.6 s — AB (ref 10.4–12.5)

## 2013-12-10 NOTE — Progress Notes (Signed)
Subjective:    Patient ID: Brian [name], male    DOB: 1955-05-11, 58 y.o.   MRN: [id number]  HPI  He is here today to recheck his INR.  He denies any bleeding or bruising.  He is taking 10 mg everyday and he missed Monday's dose.   His INR is therapeutic at 2.1.  His blood sugars range between 120-135. He denies any hypoglycemia. His blood pressure is well controlled at 110/70. He denies any chest pain shortness of breath or dyspnea on exertion.  Patient is due for fasting lab work including a hemoglobin A1c and fasting lipid panel.  He is due for his flu shot today. Past Medical History  Diagnosis Date  . Diabetes mellitus   . Hypertension   . Atrial fibrillation   . Cardiomyopathy   . Cerebrovascular accident   . Hyperlipidemia   . Seizures    Current Outpatient Prescriptions on File Prior to Visit  Medication Sig Dispense Refill  . amLODipine (NORVASC) 10 MG tablet TAKE ONE TABLET BY MOUTH ONCE DAILY  30 tablet  11  . aspirin 81 MG tablet Take 81 mg by mouth daily.      . carvedilol (COREG) 25 MG tablet Take 25 mg by mouth 2 (two) times daily with a meal.      . fenofibrate 160 MG tablet Take 1 tablet (160 mg total) by mouth daily.  30 tablet  5  . glucose blood (ONE TOUCH ULTRA TEST) test strip Pt checks BS qid Dx 250.00  200 each  5  . hydrALAZINE (APRESOLINE) 50 MG tablet Take 1 tablet (50 mg total) by mouth 3 (three) times daily.  90 tablet  11  . Insulin Detemir (LEVEMIR FLEXTOUCH) 100 UNIT/ML Pen INJECT 70 UNITS SUBCUTANEOUSLY EVERY DAY IN THE MORNING AND INJECT 40 UNITS EVERY DAY AT BEDTIME  45 pen  5  . Insulin Syringe-Needle U-100 (INSULIN SYRINGE .3CC/31GX5/16") 31G X 5/16" 0.3 ML MISC Use once daily to inject insulin.       . Insulin Syringes, Disposable, U-100 1 ML MISC by Does not apply route. ReliOn insulin syringes  Capacity - 3/65ml - gauge - 30 - 81mm      . levETIRAcetam (KEPPRA) 250 MG tablet Take 1 tablet (250 mg total) by mouth 2 (two) times daily.  60 tablet   4  . RELION MINI PEN NEEDLES 31G X 6 MM MISC USE AS DIRECTED FOR INJECTING INSULIN  50 each  3  . warfarin (COUMADIN) 10 MG tablet 10 mg qd       No current facility-administered medications on file prior to visit.   Allergies  Allergen Reactions  . Codeine Other (See Comments)    Unknown reaction   History   Social History  . Marital Status: Divorced    Spouse Name: N/A    Number of Children: N/A  . Years of Education: N/A   Occupational History  . Not on file.   Social History Main Topics  . Smoking status: Never Smoker   . Smokeless tobacco: Never Used  . Alcohol Use: No  . Drug Use: Not on file  . Sexual Activity: Not on file   Other Topics Concern  . Not on file   Social History Narrative   He has children.      Review of Systems  All other systems reviewed and are negative.      Objective:   Physical Exam  Cardiovascular: Normal rate and normal heart sounds.  An irregularly irregular rhythm present.  No murmur heard. Pulmonary/Chest: Effort normal and breath sounds normal. No respiratory distress. He has no wheezes. He has no rales.  Abdominal: Soft. Bowel sounds are normal.  Musculoskeletal: He exhibits no edema.          Assessment & Plan:   Atrial fibrillation, unspecified - Plan: PT with INR/Fingerstick, PT with INR/Fingerstick  Diabetes mellitus, type II, insulin dependent - Plan: COMPLETE METABOLIC PANEL WITH GFR, Lipid panel, Hemoglobin A1c  HLD (hyperlipidemia) - Plan: Lipid panel   Patient's Coumadin is therapeutic.  Continue coumadin 10 mg every day. Recheck in one month. I have also asked the patient to return fasting for a fasting lipid panel as well as a hemoglobin A1c. His blood pressure is well controlled

## 2013-12-17 ENCOUNTER — Other Ambulatory Visit: Payer: Managed Care, Other (non HMO)

## 2013-12-17 DIAGNOSIS — E785 Hyperlipidemia, unspecified: Secondary | ICD-10-CM

## 2013-12-17 DIAGNOSIS — IMO0002 Reserved for concepts with insufficient information to code with codable children: Secondary | ICD-10-CM

## 2013-12-17 DIAGNOSIS — I10 Essential (primary) hypertension: Secondary | ICD-10-CM

## 2013-12-17 DIAGNOSIS — E1165 Type 2 diabetes mellitus with hyperglycemia: Secondary | ICD-10-CM

## 2013-12-17 LAB — COMPLETE METABOLIC PANEL WITH GFR
ALBUMIN: 4.1 g/dL (ref 3.5–5.2)
ALT: 24 U/L (ref 0–53)
AST: 18 U/L (ref 0–37)
Alkaline Phosphatase: 61 U/L (ref 39–117)
BUN: 20 mg/dL (ref 6–23)
CALCIUM: 8.9 mg/dL (ref 8.4–10.5)
CO2: 24 meq/L (ref 19–32)
Chloride: 102 mEq/L (ref 96–112)
Creat: 1.6 mg/dL — ABNORMAL HIGH (ref 0.50–1.35)
GFR, EST AFRICAN AMERICAN: 53 mL/min — AB
GFR, Est Non African American: 46 mL/min — ABNORMAL LOW
Glucose, Bld: 154 mg/dL — ABNORMAL HIGH (ref 70–99)
POTASSIUM: 4.2 meq/L (ref 3.5–5.3)
Sodium: 135 mEq/L (ref 135–145)
TOTAL PROTEIN: 6.7 g/dL (ref 6.0–8.3)
Total Bilirubin: 0.5 mg/dL (ref 0.2–1.2)

## 2013-12-17 LAB — CBC WITH DIFFERENTIAL/PLATELET
Basophils Absolute: 0 10*3/uL (ref 0.0–0.1)
Basophils Relative: 0 % (ref 0–1)
EOS ABS: 0.2 10*3/uL (ref 0.0–0.7)
EOS PCT: 2 % (ref 0–5)
HCT: 44.2 % (ref 39.0–52.0)
Hemoglobin: 15.5 g/dL (ref 13.0–17.0)
LYMPHS ABS: 2.3 10*3/uL (ref 0.7–4.0)
Lymphocytes Relative: 29 % (ref 12–46)
MCH: 29.2 pg (ref 26.0–34.0)
MCHC: 35.1 g/dL (ref 30.0–36.0)
MCV: 83.4 fL (ref 78.0–100.0)
MONO ABS: 0.7 10*3/uL (ref 0.1–1.0)
Monocytes Relative: 9 % (ref 3–12)
Neutro Abs: 4.9 10*3/uL (ref 1.7–7.7)
Neutrophils Relative %: 60 % (ref 43–77)
Platelets: 261 10*3/uL (ref 150–400)
RBC: 5.3 MIL/uL (ref 4.22–5.81)
RDW: 14.2 % (ref 11.5–15.5)
WBC: 8.1 10*3/uL (ref 4.0–10.5)

## 2013-12-17 LAB — LIPID PANEL
CHOL/HDL RATIO: 7.3 ratio
Cholesterol: 227 mg/dL — ABNORMAL HIGH (ref 0–200)
HDL: 31 mg/dL — AB (ref >39)
TRIGLYCERIDES: 480 mg/dL — AB (ref <150)

## 2013-12-17 LAB — HEMOGLOBIN A1C
Hgb A1c MFr Bld: 9 % — ABNORMAL HIGH (ref <5.7)
Mean Plasma Glucose: 212 mg/dL — ABNORMAL HIGH (ref <117)

## 2014-01-22 ENCOUNTER — Ambulatory Visit: Payer: Managed Care, Other (non HMO) | Admitting: Family Medicine

## 2014-01-26 ENCOUNTER — Ambulatory Visit (INDEPENDENT_AMBULATORY_CARE_PROVIDER_SITE_OTHER): Payer: Managed Care, Other (non HMO) | Admitting: Family Medicine

## 2014-01-26 ENCOUNTER — Encounter: Payer: Self-pay | Admitting: Family Medicine

## 2014-01-26 VITALS — BP 132/84 | HR 76 | Temp 98.2°F | Resp 18 | Wt 220.0 lb

## 2014-01-26 DIAGNOSIS — Z7901 Long term (current) use of anticoagulants: Secondary | ICD-10-CM

## 2014-01-26 LAB — PT WITH INR/FINGERSTICK
INR, fingerstick: 2.3 — ABNORMAL HIGH (ref 0.80–1.20)
PT, fingerstick: 27.6 seconds — ABNORMAL HIGH (ref 10.4–12.5)

## 2014-01-26 NOTE — Progress Notes (Signed)
Subjective:    Patient ID: Brian [name], male    DOB: September 04, 1955, 58 y.o.   MRN: [id number]  HPI  Patient has history of a true fibrillation and CVA. He is currently on Coumadin 10 mg by mouth daily. His INR is therapeutic at 2.3. He denies any bleeding or bruising. When I last saw the patient October 29, his hemoglobin A1c was elevated at 9.0. The patient admits that he ran out of insulin and stopped taking his insulin for several weeks. Since resuming his insulin, he states his fasting blood sugars ranging 80-120 his two-hour postprandial sugars less than 140. Past Medical History  Diagnosis Date  . Diabetes mellitus   . Hypertension   . Atrial fibrillation   . Cardiomyopathy   . Cerebrovascular accident   . Hyperlipidemia   . Seizures    Past Surgical History  Procedure Laterality Date  . Ear surgery as a child     Current Outpatient Prescriptions on File Prior to Visit  Medication Sig Dispense Refill  . amLODipine (NORVASC) 10 MG tablet TAKE ONE TABLET BY MOUTH ONCE DAILY 30 tablet 11  . aspirin 81 MG tablet Take 81 mg by mouth daily.    . carvedilol (COREG) 25 MG tablet Take 25 mg by mouth 2 (two) times daily with a meal.    . glucose blood (ONE TOUCH ULTRA TEST) test strip Pt checks BS qid Dx 250.00 200 each 5  . hydrALAZINE (APRESOLINE) 50 MG tablet Take 1 tablet (50 mg total) by mouth 3 (three) times daily. 90 tablet 11  . Insulin Detemir (LEVEMIR FLEXTOUCH) 100 UNIT/ML Pen INJECT 70 UNITS SUBCUTANEOUSLY EVERY DAY IN THE MORNING AND INJECT 40 UNITS EVERY DAY AT BEDTIME 45 pen 5  . Insulin Syringe-Needle U-100 (INSULIN SYRINGE .3CC/31GX5/16") 31G X 5/16" 0.3 ML MISC Use once daily to inject insulin.     . Insulin Syringes, Disposable, U-100 1 ML MISC by Does not apply route. ReliOn insulin syringes  Capacity - 3/8ml - gauge - 30 - 53mm    . RELION MINI PEN NEEDLES 31G X 6 MM MISC USE AS DIRECTED FOR INJECTING INSULIN 50 each 3  . warfarin (COUMADIN) 10 MG tablet 10 mg qd     . fenofibrate 160 MG tablet Take 1 tablet (160 mg total) by mouth daily. 30 tablet 5  . levETIRAcetam (KEPPRA) 250 MG tablet Take 1 tablet (250 mg total) by mouth 2 (two) times daily. (Patient not taking: Reported on 01/26/2014) 60 tablet 4   No current facility-administered medications on file prior to visit.   Allergies  Allergen Reactions  . Codeine Other (See Comments)    Unknown reaction   History   Social History  . Marital Status: Divorced    Spouse Name: N/A    Number of Children: N/A  . Years of Education: N/A   Occupational History  . Not on file.   Social History Main Topics  . Smoking status: Never Smoker   . Smokeless tobacco: Never Used  . Alcohol Use: No  . Drug Use: Not on file  . Sexual Activity: Not on file   Other Topics Concern  . Not on file   Social History Narrative   He has children.     Review of Systems  All other systems reviewed and are negative.      Objective:   Physical Exam  Cardiovascular: Normal rate, regular rhythm and normal heart sounds.   Pulmonary/Chest: Effort normal and breath sounds normal.  No respiratory distress. He has no wheezes. He has no rales.  Abdominal: Soft. Bowel sounds are normal. He exhibits no distension. There is no tenderness. There is no rebound.  Musculoskeletal: He exhibits no edema.  Vitals reviewed.         Assessment & Plan:  Long term current use of anticoagulant therapy - Plan: PT with INR/Fingerstick  Patient's blood pressure is excellent. His blood sugars sound like they're well controlled. I would recheck a hemoglobin A1c in February. His INR is therapeutic. I made no changes to his Coumadin dose. Recheck his Coumadin in the middle of January.

## 2014-03-09 ENCOUNTER — Encounter: Payer: Self-pay | Admitting: Family Medicine

## 2014-03-09 ENCOUNTER — Ambulatory Visit (INDEPENDENT_AMBULATORY_CARE_PROVIDER_SITE_OTHER): Payer: Medicare Other | Admitting: Family Medicine

## 2014-03-09 DIAGNOSIS — I4891 Unspecified atrial fibrillation: Secondary | ICD-10-CM

## 2014-03-09 LAB — PT WITH INR/FINGERSTICK
INR, fingerstick: 2.8 — ABNORMAL HIGH (ref 0.80–1.20)
PT, fingerstick: 34.2 seconds — ABNORMAL HIGH (ref 10.4–12.5)

## 2014-03-09 NOTE — Progress Notes (Signed)
Subjective:    Patient ID: Brian [name], male    DOB: [birth year], 59 y.o.   MRN: [id number]  HPI Patient is a 59 year old white male with a history of atrial fibrillation. Currently his heart rate is well controlled at 80 bpm. He is here today to check his INR. His INR is therapeutic at 2.8. He denies any changes in his medications. He denies any recent antibiotic use. He denies any change in his diet. Unfortunately his son-in-law was just killed in a motor vehicle accident and obviously he is going through a tremendous amount of stress. His daughter has recently moved back home. Past Medical History  Diagnosis Date  . Diabetes mellitus   . Hypertension   . Atrial fibrillation   . Cardiomyopathy   . Cerebrovascular accident   . Hyperlipidemia   . Seizures    Past Surgical History  Procedure Laterality Date  . Ear surgery as a child     Current Outpatient Prescriptions on File Prior to Visit  Medication Sig Dispense Refill  . amLODipine (NORVASC) 10 MG tablet TAKE ONE TABLET BY MOUTH ONCE DAILY 30 tablet 11  . aspirin 81 MG tablet Take 81 mg by mouth daily.    . carvedilol (COREG) 25 MG tablet Take 25 mg by mouth 2 (two) times daily with a meal.    . glucose blood (ONE TOUCH ULTRA TEST) test strip Pt checks BS qid Dx 250.00 200 each 5  . hydrALAZINE (APRESOLINE) 50 MG tablet Take 1 tablet (50 mg total) by mouth 3 (three) times daily. 90 tablet 11  . Insulin Detemir (LEVEMIR FLEXTOUCH) 100 UNIT/ML Pen INJECT 70 UNITS SUBCUTANEOUSLY EVERY DAY IN THE MORNING AND INJECT 40 UNITS EVERY DAY AT BEDTIME 45 pen 5  . Insulin Syringe-Needle U-100 (INSULIN SYRINGE .3CC/31GX5/16") 31G X 5/16" 0.3 ML MISC Use once daily to inject insulin.     . Insulin Syringes, Disposable, U-100 1 ML MISC by Does not apply route. ReliOn insulin syringes  Capacity - 3/38ml - gauge - 30 - 82mm    . levETIRAcetam (KEPPRA) 250 MG tablet Take 1 tablet (250 mg total) by mouth 2 (two) times daily. 60 tablet 4  .  RELION MINI PEN NEEDLES 31G X 6 MM MISC USE AS DIRECTED FOR INJECTING INSULIN 50 each 3  . warfarin (COUMADIN) 10 MG tablet 10 mg qd    . fenofibrate 160 MG tablet Take 1 tablet (160 mg total) by mouth daily. (Patient not taking: Reported on 03/09/2014) 30 tablet 5   No current facility-administered medications on file prior to visit.   Allergies  Allergen Reactions  . Codeine Other (See Comments)    Unknown reaction   History   Social History  . Marital Status: Divorced    Spouse Name: N/A    Number of Children: N/A  . Years of Education: N/A   Occupational History  . Not on file.   Social History Main Topics  . Smoking status: Never Smoker   . Smokeless tobacco: Never Used  . Alcohol Use: No  . Drug Use: Not on file  . Sexual Activity: Not on file   Other Topics Concern  . Not on file   Social History Narrative   He has children.      Review of Systems  All other systems reviewed and are negative.      Objective:   Physical Exam  Cardiovascular: Normal rate.  An irregularly irregular rhythm present.  Pulmonary/Chest: Effort normal and breath  sounds normal.  Vitals reviewed.         Assessment & Plan:  Atrial fibrillation, unspecified - Plan: PT with INR/Fingerstick  INR is therapeutic. Continue Coumadin at his current dose.  Recheck INR in 6 weeks.

## 2014-04-09 ENCOUNTER — Other Ambulatory Visit: Payer: Self-pay | Admitting: Family Medicine

## 2014-04-09 NOTE — Telephone Encounter (Signed)
Medication refilled per protocol. 

## 2014-04-20 ENCOUNTER — Ambulatory Visit (INDEPENDENT_AMBULATORY_CARE_PROVIDER_SITE_OTHER): Payer: Medicare Other | Admitting: Family Medicine

## 2014-04-20 ENCOUNTER — Encounter: Payer: Self-pay | Admitting: Family Medicine

## 2014-04-20 VITALS — BP 120/80 | HR 64 | Temp 98.1°F | Resp 18 | Ht 69.0 in | Wt 219.0 lb

## 2014-04-20 DIAGNOSIS — IMO0002 Reserved for concepts with insufficient information to code with codable children: Secondary | ICD-10-CM

## 2014-04-20 DIAGNOSIS — I4891 Unspecified atrial fibrillation: Secondary | ICD-10-CM

## 2014-04-20 DIAGNOSIS — E1165 Type 2 diabetes mellitus with hyperglycemia: Secondary | ICD-10-CM

## 2014-04-20 LAB — COMPLETE METABOLIC PANEL WITH GFR
ALBUMIN: 4.3 g/dL (ref 3.5–5.2)
ALT: 17 U/L (ref 0–53)
AST: 16 U/L (ref 0–37)
Alkaline Phosphatase: 66 U/L (ref 39–117)
BUN: 22 mg/dL (ref 6–23)
CO2: 21 mEq/L (ref 19–32)
Calcium: 9 mg/dL (ref 8.4–10.5)
Chloride: 103 mEq/L (ref 96–112)
Creat: 1.38 mg/dL — ABNORMAL HIGH (ref 0.50–1.35)
GFR, Est African American: 64 mL/min
GFR, Est Non African American: 55 mL/min — ABNORMAL LOW
Glucose, Bld: 154 mg/dL — ABNORMAL HIGH (ref 70–99)
POTASSIUM: 4.1 meq/L (ref 3.5–5.3)
SODIUM: 138 meq/L (ref 135–145)
TOTAL PROTEIN: 6.9 g/dL (ref 6.0–8.3)
Total Bilirubin: 0.6 mg/dL (ref 0.2–1.2)

## 2014-04-20 LAB — LIPID PANEL
Cholesterol: 212 mg/dL — ABNORMAL HIGH (ref 0–200)
HDL: 28 mg/dL — AB (ref >=40)
TRIGLYCERIDES: 430 mg/dL — AB (ref <150)
Total CHOL/HDL Ratio: 7.6 Ratio

## 2014-04-20 LAB — PT WITH INR/FINGERSTICK
INR FINGERSTICK: 2.9 — AB (ref 0.80–1.20)
PT FINGERSTICK: 34.4 s — AB (ref 10.4–12.5)

## 2014-04-20 LAB — HEMOGLOBIN A1C
Hgb A1c MFr Bld: 8.3 % — ABNORMAL HIGH (ref <5.7)
Mean Plasma Glucose: 192 mg/dL — ABNORMAL HIGH (ref <117)

## 2014-04-20 NOTE — Progress Notes (Signed)
Subjective:    Patient ID: Brian [name], male    DOB: Jul 06, 1955, 59 y.o.   MRN: [id number]  HPI  Patient is a 59 year old white male with a history of atrial fibrillation. Currently his heart rate is well controlled at 80 bpm. He is here today to check his INR. His INR is therapeutic at 2.9. He denies any changes in his medications. He denies any recent antibiotic use. He denies any change in his diet.  He is overdue to recheck his hemoglobin A1c. We last checked his A1c in October it was 9.0.   He has changed his diet and has been compliant with his insulin. He would like to recheck this today along with his fasting lipid panel Past Medical History  Diagnosis Date  . Diabetes mellitus   . Hypertension   . Atrial fibrillation   . Cardiomyopathy   . Cerebrovascular accident   . Hyperlipidemia   . Seizures    Past Surgical History  Procedure Laterality Date  . Ear surgery as a child     Current Outpatient Prescriptions on File Prior to Visit  Medication Sig Dispense Refill  . amLODipine (NORVASC) 10 MG tablet TAKE ONE TABLET BY MOUTH ONCE DAILY 30 tablet 11  . aspirin 81 MG tablet Take 81 mg by mouth daily.    . carvedilol (COREG) 25 MG tablet Take 25 mg by mouth 2 (two) times daily with a meal.    . glucose blood (ONE TOUCH ULTRA TEST) test strip Pt checks BS qid Dx 250.00 200 each 5  . hydrALAZINE (APRESOLINE) 50 MG tablet Take 1 tablet (50 mg total) by mouth 3 (three) times daily. 90 tablet 11  . Insulin Detemir (LEVEMIR FLEXTOUCH) 100 UNIT/ML Pen INJECT 70 UNITS SUBCUTANEOUSLY EVERY DAY IN THE MORNING AND INJECT 40 UNITS EVERY DAY AT BEDTIME 45 pen 5  . Insulin Syringe-Needle U-100 (INSULIN SYRINGE .3CC/31GX5/16") 31G X 5/16" 0.3 ML MISC Use once daily to inject insulin.     . Insulin Syringes, Disposable, U-100 1 ML MISC by Does not apply route. ReliOn insulin syringes  Capacity - 3/25ml - gauge - 30 - 40mm    . levETIRAcetam (KEPPRA) 250 MG tablet Take 1 tablet (250 mg total)  by mouth 2 (two) times daily. 60 tablet 4  . RELION MINI PEN NEEDLES 31G X 6 MM MISC USE AS DIRECTED FOR INJECTING INSULIN 50 each 3  . warfarin (COUMADIN) 10 MG tablet TAKE ONE TABLET BY MOUTH ONCE DAILY EXCEPT  15MG   ON  FRIDAY (Patient taking differently: TAKE ONE TABLET BY MOUTH ONCE DAILY) 45 tablet 2  . fenofibrate 160 MG tablet Take 1 tablet (160 mg total) by mouth daily. (Patient not taking: Reported on 03/09/2014) 30 tablet 5   No current facility-administered medications on file prior to visit.   Allergies  Allergen Reactions  . Codeine Other (See Comments)    Unknown reaction   History   Social History  . Marital Status: Divorced    Spouse Name: N/A  . Number of Children: N/A  . Years of Education: N/A   Occupational History  . Not on file.   Social History Main Topics  . Smoking status: Never Smoker   . Smokeless tobacco: Never Used  . Alcohol Use: No  . Drug Use: Not on file  . Sexual Activity: Not on file   Other Topics Concern  . Not on file   Social History Narrative   He has children.  Review of Systems  All other systems reviewed and are negative.      Objective:   Physical Exam  Cardiovascular: Normal rate.  An irregularly irregular rhythm present.  Pulmonary/Chest: Effort normal and breath sounds normal.  Vitals reviewed.         Assessment & Plan:  Atrial fibrillation, unspecified - Plan: PT with INR/Fingerstick  Diabetes mellitus type II, uncontrolled - Plan: Hemoglobin A1c, COMPLETE METABOLIC PANEL WITH GFR, Lipid panel   INR is therapeutic. Continue Coumadin at his current dose.  Recheck INR in 6 weeks.   Blood pressure is excellent. I will check a hemoglobin A1c. Goal hemoglobin A1c is less than 7.0.  Goal LDL is <70

## 2014-06-01 ENCOUNTER — Encounter: Payer: Self-pay | Admitting: Family Medicine

## 2014-06-01 ENCOUNTER — Ambulatory Visit (INDEPENDENT_AMBULATORY_CARE_PROVIDER_SITE_OTHER): Payer: Medicare Other | Admitting: Family Medicine

## 2014-06-01 DIAGNOSIS — I4891 Unspecified atrial fibrillation: Secondary | ICD-10-CM | POA: Diagnosis not present

## 2014-06-01 LAB — PT WITH INR/FINGERSTICK
INR FINGERSTICK: 1.4 — AB (ref 0.80–1.20)
PT, fingerstick: 17.3 seconds — ABNORMAL HIGH (ref 10.4–12.5)

## 2014-06-01 NOTE — Progress Notes (Signed)
Subjective:    Patient ID: Brian [name], male    DOB: 19-May-1955, 59 y.o.   MRN: [id number]  HPI 04/20/14 Patient is a 59 year old white male with a history of atrial fibrillation. Currently his heart rate is well controlled at 80 bpm. He is here today to check his INR. His INR is therapeutic at 2.9. He denies any changes in his medications. He denies any recent antibiotic use. He denies any change in his diet.  He is overdue to recheck his hemoglobin A1c. We last checked his A1c in October it was 9.0.   He has changed his diet and has been compliant with his insulin. He would like to recheck this today along with his fasting lipid panel.  At that time, my plan was:  INR is therapeutic. Continue Coumadin at his current dose.  Recheck INR in 6 weeks.   Blood pressure is excellent. I will check a hemoglobin A1c. Goal hemoglobin A1c is less than 7.0.  Goal LDL is <70  Office Visit on 04/20/2014  Component Date Value Ref Range Status  . PT, fingerstick 04/20/2014 34.4* 10.4 - 12.5 seconds Final  . INR, fingerstick 04/20/2014 2.9* 0.80 - 1.20 Final   Comment: The INR is of principal utility in following patients on stable doses of oral anticoagulants. The therapeutic range is generally 2.0 to 3.0, but may be 3.0 to 4.0 in patients with mechanical cardiac valves, recurrent embolisms and antiphospholipid antibodies (including lupus inhibitors).   INRs >2.9 may be falsely elevated in patients receiving either unfractionated Heparin or Low Molecular Weight Heparin. Follow up testing in a hospital laboratory may be helpful if clinically indicated.    . Hgb A1c MFr Bld 04/20/2014 8.3* <5.7 % Final   Comment:                                                                        According to the ADA Clinical Practice Recommendations for 2011, when HbA1c is used as a screening test:     >=6.5%   Diagnostic of Diabetes Mellitus            (if abnormal result is confirmed)   5.7-6.4%    Increased risk of developing Diabetes Mellitus   References:Diagnosis and Classification of Diabetes Mellitus,Diabetes FVOH,6067,70(HEKBT 1):S62-S69 and Standards of Medical Care in         Diabetes - 2011,Diabetes CYEL,8590,93 (Suppl 1):S11-S61.     . Mean Plasma Glucose 04/20/2014 192* <117 mg/dL Final  . Sodium 04/20/2014 138  135 - 145 mEq/L Final  . Potassium 04/20/2014 4.1  3.5 - 5.3 mEq/L Final  . Chloride 04/20/2014 103  96 - 112 mEq/L Final  . CO2 04/20/2014 21  19 - 32 mEq/L Final  . Glucose, Bld 04/20/2014 154* 70 - 99 mg/dL Final  . BUN 04/20/2014 22  6 - 23 mg/dL Final  . Creat 04/20/2014 1.38* 0.50 - 1.35 mg/dL Final  . Total Bilirubin 04/20/2014 0.6  0.2 - 1.2 mg/dL Final  . Alkaline Phosphatase 04/20/2014 66  39 - 117 U/L Final  . AST 04/20/2014 16  0 - 37 U/L Final  . ALT 04/20/2014 17  0 - 53 U/L Final  . Total Protein 04/20/2014 6.9  6.0 - 8.3 g/dL Final  . Albumin 04/20/2014 4.3  3.5 - 5.2 g/dL Final  . Calcium 04/20/2014 9.0  8.4 - 10.5 mg/dL Final  . GFR, Est African American 04/20/2014 64   Final  . GFR, Est Non African American 04/20/2014 55*  Final   Comment:   The estimated GFR is a calculation valid for adults (>=60 years old) that uses the CKD-EPI algorithm to adjust for age and sex. It is   not to be used for children, pregnant women, hospitalized patients,    patients on dialysis, or with rapidly changing kidney function. According to the NKDEP, eGFR >89 is normal, 60-89 shows mild impairment, 30-59 shows moderate impairment, 15-29 shows severe impairment and <15 is ESRD.     Marland Kitchen Cholesterol 04/20/2014 212* 0 - 200 mg/dL Final   Comment: ATP III Classification:       < 200        mg/dL        Desirable      200 - 239     mg/dL        Borderline High      >= 240        mg/dL        High     . Triglycerides 04/20/2014 430* <150 mg/dL Final  . HDL 04/20/2014 28* >=40 mg/dL Final   ** Please note change in reference range(s). **  . Total CHOL/HDL  Ratio 04/20/2014 7.6   Final  . VLDL 04/20/2014 NOT CALC  0 - 40 mg/dL Final   Comment:   Not calculated due to Triglyceride >400. Suggest ordering Direct LDL (Unit Code: [phone]).   . LDL Cholesterol 04/20/2014 NOT CALC  0 - 99 mg/dL Final   Comment:   Not calculated due to Triglyceride >400. Suggest ordering Direct LDL (Unit Code: [phone]).   Total Cholesterol/HDL Ratio:CHD Risk                        Coronary Heart Disease Risk Table                                        Men       Women          1/2 Average Risk              3.4        3.3              Average Risk              5.0        4.4           2X Average Risk              9.6        7.1           3X Average Risk             23.4       11.0 Use the calculated Patient Ratio above and the CHD Risk table  to determine the patient's CHD Risk. ATP III Classification (LDL):       < 100        mg/dL         Optimal      100 - 129     mg/dL  Near or Above Optimal      130 - 159     mg/dL         Borderline High      160 - 189     mg/dL         High       > 190        mg/dL         Very High      06/01/14 Labs revealed elevated trigs and a less than ideal HgA1c.  Patient had stopped the fenofibrate.  Therefore, we asked him to resume the fenofibrate and recheck a FLP in 3 months.  Unfortunately, he has not checked any sugars yet for me to review.  He is currently on levemir 70 units in the morning and 40 units in the evening. INR is subtherapeutic at 1.4. He denies missing any doses of his Coumadin. He denies any change in his vitamin K intake. He denies any changes in his medications.  Past Medical History  Diagnosis Date  . Diabetes mellitus   . Hypertension   . Atrial fibrillation   . Cardiomyopathy   . Cerebrovascular accident   . Hyperlipidemia   . Seizures    Past Surgical History  Procedure Laterality Date  . Ear surgery as a child     Current Outpatient Prescriptions on File Prior to Visit  Medication Sig  Dispense Refill  . amLODipine (NORVASC) 10 MG tablet TAKE ONE TABLET BY MOUTH ONCE DAILY 30 tablet 11  . aspirin 81 MG tablet Take 81 mg by mouth daily.    . carvedilol (COREG) 25 MG tablet Take 25 mg by mouth 2 (two) times daily with a meal.    . fenofibrate 160 MG tablet Take 1 tablet (160 mg total) by mouth daily. (Patient not taking: Reported on 03/09/2014) 30 tablet 5  . glucose blood (ONE TOUCH ULTRA TEST) test strip Pt checks BS qid Dx 250.00 200 each 5  . hydrALAZINE (APRESOLINE) 50 MG tablet Take 1 tablet (50 mg total) by mouth 3 (three) times daily. 90 tablet 11  . Insulin Detemir (LEVEMIR FLEXTOUCH) 100 UNIT/ML Pen INJECT 70 UNITS SUBCUTANEOUSLY EVERY DAY IN THE MORNING AND INJECT 40 UNITS EVERY DAY AT BEDTIME 45 pen 5  . Insulin Syringe-Needle U-100 (INSULIN SYRINGE .3CC/31GX5/16") 31G X 5/16" 0.3 ML MISC Use once daily to inject insulin.     . Insulin Syringes, Disposable, U-100 1 ML MISC by Does not apply route. ReliOn insulin syringes  Capacity - 3/58m - gauge - 30 - 837m   . levETIRAcetam (KEPPRA) 250 MG tablet Take 1 tablet (250 mg total) by mouth 2 (two) times daily. 60 tablet 4  . RELION MINI PEN NEEDLES 31G X 6 MM MISC USE AS DIRECTED FOR INJECTING INSULIN 50 each 3  . warfarin (COUMADIN) 10 MG tablet TAKE ONE TABLET BY MOUTH ONCE DAILY EXCEPT  15MG  ON  FRIDAY (Patient taking differently: TAKE ONE TABLET BY MOUTH ONCE DAILY) 45 tablet 2   No current facility-administered medications on file prior to visit.   Allergies  Allergen Reactions  . Codeine Other (See Comments)    Unknown reaction   History   Social History  . Marital Status: Divorced    Spouse Name: N/A  . Number of Children: N/A  . Years of Education: N/A   Occupational History  . Not on file.   Social History Main Topics  . Smoking status: Never Smoker   .  Smokeless tobacco: Never Used  . Alcohol Use: No  . Drug Use: Not on file  . Sexual Activity: Not on file   Other Topics Concern  . Not on  file   Social History Narrative   He has children.      Review of Systems  All other systems reviewed and are negative.      Objective:   Physical Exam  Cardiovascular: Normal rate.  An irregularly irregular rhythm present.  Pulmonary/Chest: Effort normal and breath sounds normal.  Vitals reviewed.         Assessment & Plan:  Atrial fibrillation, unspecified - Plan: PT with INR/Fingerstick  change Coumadin to 15 mg on Wednesday and Sunday, 10 mg all other days. Recheck INR in 2 weeks. Also bring in his fasting blood sugars and two-hour postprandial sugars in 2 weeks for me to review.

## 2014-06-15 ENCOUNTER — Encounter: Payer: Self-pay | Admitting: Family Medicine

## 2014-06-15 ENCOUNTER — Ambulatory Visit (INDEPENDENT_AMBULATORY_CARE_PROVIDER_SITE_OTHER): Payer: Medicare Other | Admitting: Family Medicine

## 2014-06-15 DIAGNOSIS — I4891 Unspecified atrial fibrillation: Secondary | ICD-10-CM | POA: Diagnosis not present

## 2014-06-15 LAB — PT WITH INR/FINGERSTICK
INR, fingerstick: 3.4 — ABNORMAL HIGH (ref 0.80–1.20)
PT FINGERSTICK: 41.1 s — AB (ref 10.4–12.5)

## 2014-06-15 NOTE — Progress Notes (Signed)
Subjective:    Patient ID: Brian [name], male    DOB: [birth year], 59 y.o.   MRN: [id number]  HPI Patient has atrial fibrillation. Today he is in normal sinus rhythm. His INR today is supratherapeutic at 3.4. He is currently taking 15 mg on Sundays and Wednesdays and 10 mg on all other days. Past Medical History  Diagnosis Date  . Diabetes mellitus   . Hypertension   . Atrial fibrillation   . Cardiomyopathy   . Cerebrovascular accident   . Hyperlipidemia   . Seizures    Past Surgical History  Procedure Laterality Date  . Ear surgery as a child     Current Outpatient Prescriptions on File Prior to Visit  Medication Sig Dispense Refill  . amLODipine (NORVASC) 10 MG tablet TAKE ONE TABLET BY MOUTH ONCE DAILY 30 tablet 11  . aspirin 81 MG tablet Take 81 mg by mouth daily.    . carvedilol (COREG) 25 MG tablet Take 25 mg by mouth 2 (two) times daily with a meal.    . fenofibrate 160 MG tablet Take 1 tablet (160 mg total) by mouth daily. (Patient not taking: Reported on 03/09/2014) 30 tablet 5  . glucose blood (ONE TOUCH ULTRA TEST) test strip Pt checks BS qid Dx 250.00 200 each 5  . hydrALAZINE (APRESOLINE) 50 MG tablet Take 1 tablet (50 mg total) by mouth 3 (three) times daily. 90 tablet 11  . Insulin Detemir (LEVEMIR FLEXTOUCH) 100 UNIT/ML Pen INJECT 70 UNITS SUBCUTANEOUSLY EVERY DAY IN THE MORNING AND INJECT 40 UNITS EVERY DAY AT BEDTIME 45 pen 5  . Insulin Syringe-Needle U-100 (INSULIN SYRINGE .3CC/31GX5/16") 31G X 5/16" 0.3 ML MISC Use once daily to inject insulin.     . Insulin Syringes, Disposable, U-100 1 ML MISC by Does not apply route. ReliOn insulin syringes  Capacity - 3/24m - gauge - 30 - 871m   . levETIRAcetam (KEPPRA) 250 MG tablet Take 1 tablet (250 mg total) by mouth 2 (two) times daily. 60 tablet 4  . RELION MINI PEN NEEDLES 31G X 6 MM MISC USE AS DIRECTED FOR INJECTING INSULIN 50 each 3  . warfarin (COUMADIN) 10 MG tablet TAKE ONE TABLET BY MOUTH ONCE DAILY EXCEPT   '15MG'$   ON  FRIDAY (Patient taking differently: TAKE ONE TABLET BY MOUTH ONCE DAILY) 45 tablet 2   No current facility-administered medications on file prior to visit.   Allergies  Allergen Reactions  . Codeine Other (See Comments)    Unknown reaction   History   Social History  . Marital Status: Divorced    Spouse Name: N/A  . Number of Children: N/A  . Years of Education: N/A   Occupational History  . Not on file.   Social History Main Topics  . Smoking status: Never Smoker   . Smokeless tobacco: Never Used  . Alcohol Use: No  . Drug Use: Not on file  . Sexual Activity: Not on file   Other Topics Concern  . Not on file   Social History Narrative   He has children.      Review of Systems  All other systems reviewed and are negative.      Objective:   Physical Exam  Cardiovascular: Normal rate, regular rhythm and normal heart sounds.   Pulmonary/Chest: Effort normal and breath sounds normal. No respiratory distress. He has no wheezes. He has no rales. He exhibits no tenderness.  Vitals reviewed.  Assessment & Plan:  Atrial fibrillation, unspecified - Plan: PT with INR/Fingerstick  Decrease Coumadin dose to 10 mg every day except Sunday. On Sunday take 15 mg. Recheck INR in 4 weeks

## 2014-06-25 ENCOUNTER — Telehealth: Payer: Self-pay | Admitting: Family Medicine

## 2014-06-25 MED ORDER — INSULIN PEN NEEDLE 31G X 6 MM MISC: Status: DC

## 2014-06-25 NOTE — Telephone Encounter (Signed)
Patient is calling to get refill on his needles for his insulin pen if possible [facility] [employer]  [phone]

## 2014-06-25 NOTE — Telephone Encounter (Signed)
Pen Needles sent to pharm

## 2014-06-28 ENCOUNTER — Other Ambulatory Visit: Payer: Self-pay | Admitting: Family Medicine

## 2014-06-28 MED ORDER — INSULIN PEN NEEDLE 31G X 6 MM MISC: Status: DC

## 2014-06-29 ENCOUNTER — Other Ambulatory Visit: Payer: Self-pay | Admitting: Family Medicine

## 2014-06-29 MED ORDER — INSULIN PEN NEEDLE 31G X 6 MM MISC: Status: DC

## 2014-07-16 ENCOUNTER — Ambulatory Visit (INDEPENDENT_AMBULATORY_CARE_PROVIDER_SITE_OTHER): Payer: Medicare Other | Admitting: Family Medicine

## 2014-07-16 ENCOUNTER — Encounter: Payer: Self-pay | Admitting: Family Medicine

## 2014-07-16 DIAGNOSIS — I4891 Unspecified atrial fibrillation: Secondary | ICD-10-CM | POA: Diagnosis not present

## 2014-07-16 LAB — PT WITH INR/FINGERSTICK
INR FINGERSTICK: 3.1 — AB (ref 0.80–1.20)
PT, fingerstick: 37.2 seconds — ABNORMAL HIGH (ref 10.4–12.5)

## 2014-07-16 NOTE — Progress Notes (Signed)
Subjective:    Patient ID: Brian [name], male    DOB: [birth year], 59 y.o.   MRN: [id number]  HPI 06/15/14 Patient has atrial fibrillation. Today he is in normal sinus rhythm. His INR today is supratherapeutic at 3.4. He is currently taking 15 mg on Sundays and Wednesdays and 10 mg on all other days.  At that time, my plan was: Decrease Coumadin dose to 10 mg every day except Sunday. On Sunday take 15 mg. Recheck INR in 4 weeks  07/16/14 Patient is here today for follow-up. His INR is still elevated at 3.1. He is currently taking 10 mg of Coumadin every day except Sunday. On Sunday he takes 15 mg. He has not missed any doses of his Coumadin.  Patient's blood pressure is very good.  Fasting blood sugars are under 130. He is not checking 2 hour postprandial sugars regularly. He is due for hemoglobin A1c and fasting lipid panel in June Past Medical History  Diagnosis Date  . Diabetes mellitus   . Hypertension   . Atrial fibrillation   . Cardiomyopathy   . Cerebrovascular accident   . Hyperlipidemia   . Seizures    Past Surgical History  Procedure Laterality Date  . Ear surgery as a child     Current Outpatient Prescriptions on File Prior to Visit  Medication Sig Dispense Refill  . amLODipine (NORVASC) 10 MG tablet TAKE ONE TABLET BY MOUTH ONCE DAILY 30 tablet 11  . aspirin 81 MG tablet Take 81 mg by mouth daily.    . carvedilol (COREG) 25 MG tablet Take 25 mg by mouth 2 (two) times daily with a meal.    . fenofibrate 160 MG tablet Take 1 tablet (160 mg total) by mouth daily. 30 tablet 5  . glucose blood (ONE TOUCH ULTRA TEST) test strip Pt checks BS qid Dx 250.00 200 each 5  . hydrALAZINE (APRESOLINE) 50 MG tablet Take 1 tablet (50 mg total) by mouth 3 (three) times daily. 90 tablet 11  . Insulin Detemir (LEVEMIR FLEXTOUCH) 100 UNIT/ML Pen INJECT 70 UNITS SUBCUTANEOUSLY EVERY DAY IN THE MORNING AND INJECT 40 UNITS EVERY DAY AT BEDTIME 45 pen 5  . Insulin Syringe-Needle U-100  (INSULIN SYRINGE .3CC/31GX5/16") 31G X 5/16" 0.3 ML MISC Use once daily to inject insulin.     . Insulin Syringes, Disposable, U-100 1 ML MISC by Does not apply route. ReliOn insulin syringes  Capacity - 3/64m - gauge - 30 - 835m   . levETIRAcetam (KEPPRA) 250 MG tablet Take 1 tablet (250 mg total) by mouth 2 (two) times daily. 60 tablet 4  . warfarin (COUMADIN) 10 MG tablet TAKE ONE TABLET BY MOUTH ONCE DAILY EXCEPT  '15MG'$   ON  FRIDAY (Patient taking differently: TAKE ONE TABLET BY MOUTH ONCE DAILY) 45 tablet 2   No current facility-administered medications on file prior to visit.   Allergies  Allergen Reactions  . Codeine Other (See Comments)    Unknown reaction   History   Social History  . Marital Status: Divorced    Spouse Name: N/A  . Number of Children: N/A  . Years of Education: N/A   Occupational History  . Not on file.   Social History Main Topics  . Smoking status: Never Smoker   . Smokeless tobacco: Never Used  . Alcohol Use: No  . Drug Use: Not on file  . Sexual Activity: Not on file   Other Topics Concern  . Not on file  Social History Narrative   He has children.      Review of Systems  All other systems reviewed and are negative.      Objective:   Physical Exam  Cardiovascular: Normal rate, regular rhythm and normal heart sounds.   Pulmonary/Chest: Effort normal and breath sounds normal. No respiratory distress. He has no wheezes. He has no rales. He exhibits no tenderness.  Vitals reviewed.         Assessment & Plan:  Atrial fibrillation, unspecified - Plan: PT with INR/Fingerstick  decrease Coumadin to 10 mg every day and recheck INR in one month. I have asked the patient to check fasting blood sugars and two-hour postprandial sugars and bring the values to me in one week. If he is having postprandial hyperglycemia, I would start the patient on Januvia. Recheck a hemoglobin A1c and fasting lipid panel at his next office visit in one  month.

## 2014-08-09 ENCOUNTER — Other Ambulatory Visit: Payer: Self-pay | Admitting: Family Medicine

## 2014-08-24 ENCOUNTER — Encounter: Payer: Self-pay | Admitting: Family Medicine

## 2014-08-24 ENCOUNTER — Ambulatory Visit (INDEPENDENT_AMBULATORY_CARE_PROVIDER_SITE_OTHER): Payer: Medicare Other | Admitting: Family Medicine

## 2014-08-24 VITALS — BP 140/88 | HR 78 | Temp 97.8°F | Resp 18 | Ht 69.0 in | Wt 223.0 lb

## 2014-08-24 DIAGNOSIS — E1165 Type 2 diabetes mellitus with hyperglycemia: Secondary | ICD-10-CM | POA: Diagnosis not present

## 2014-08-24 DIAGNOSIS — I4891 Unspecified atrial fibrillation: Secondary | ICD-10-CM

## 2014-08-24 DIAGNOSIS — IMO0002 Reserved for concepts with insufficient information to code with codable children: Secondary | ICD-10-CM

## 2014-08-24 DIAGNOSIS — Z1211 Encounter for screening for malignant neoplasm of colon: Secondary | ICD-10-CM | POA: Diagnosis not present

## 2014-08-24 DIAGNOSIS — Z125 Encounter for screening for malignant neoplasm of prostate: Secondary | ICD-10-CM | POA: Diagnosis not present

## 2014-08-24 LAB — COMPLETE METABOLIC PANEL WITH GFR
ALT: 19 U/L (ref 0–53)
AST: 15 U/L (ref 0–37)
Albumin: 4.2 g/dL (ref 3.5–5.2)
Alkaline Phosphatase: 64 U/L (ref 39–117)
BUN: 20 mg/dL (ref 6–23)
CO2: 23 mEq/L (ref 19–32)
CREATININE: 1.3 mg/dL (ref 0.50–1.35)
Calcium: 9.1 mg/dL (ref 8.4–10.5)
Chloride: 104 mEq/L (ref 96–112)
GFR, Est African American: 68 mL/min
GFR, Est Non African American: 59 mL/min — ABNORMAL LOW
Glucose, Bld: 142 mg/dL — ABNORMAL HIGH (ref 70–99)
Potassium: 3.9 mEq/L (ref 3.5–5.3)
Sodium: 138 mEq/L (ref 135–145)
TOTAL PROTEIN: 6.8 g/dL (ref 6.0–8.3)
Total Bilirubin: 0.5 mg/dL (ref 0.2–1.2)

## 2014-08-24 LAB — LIPID PANEL
CHOL/HDL RATIO: 6.7 ratio
Cholesterol: 221 mg/dL — ABNORMAL HIGH (ref 0–200)
HDL: 33 mg/dL — ABNORMAL LOW (ref >=40)
LDL Cholesterol: 113 mg/dL — ABNORMAL HIGH (ref 0–99)
TRIGLYCERIDES: 373 mg/dL — AB (ref <150)
VLDL: 75 mg/dL — ABNORMAL HIGH (ref 0–40)

## 2014-08-24 LAB — HEMOGLOBIN A1C
Hgb A1c MFr Bld: 7.2 % — ABNORMAL HIGH (ref <5.7)
MEAN PLASMA GLUCOSE: 160 mg/dL — AB (ref <117)

## 2014-08-24 LAB — PT WITH INR/FINGERSTICK
INR, fingerstick: 2.7 — ABNORMAL HIGH (ref 0.80–1.20)
PT FINGERSTICK: 31.8 s — AB (ref 10.4–12.5)

## 2014-08-24 NOTE — Progress Notes (Signed)
Subjective:    Patient ID: Brian [name], male    DOB: 05-21-1955, 59 y.o.   MRN: [id number]  HPI 06/15/14 Patient has atrial fibrillation. Today he is in normal sinus rhythm. His INR today is supratherapeutic at 3.4. He is currently taking 15 mg on Sundays and Wednesdays and 10 mg on all other days.  At that time, my plan was: Decrease Coumadin dose to 10 mg every day except Sunday. On Sunday take 15 mg. Recheck INR in 4 weeks  07/16/14 Patient is here today for follow-up. His INR is still elevated at 3.1. He is currently taking 10 mg of Coumadin every day except Sunday. On Sunday he takes 15 mg. He has not missed any doses of his Coumadin.  Patient's blood pressure is very good.  Fasting blood sugars are under 130. He is not checking 2 hour postprandial sugars regularly. He is due for hemoglobin A1c and fasting lipid panel in June.  At that time, my plan was:  decrease Coumadin to 10 mg every day and recheck INR in one month. I have asked the patient to check fasting blood sugars and two-hour postprandial sugars and bring the values to me in one week. If he is having postprandial hyperglycemia, I would start the patient on Januvia. Recheck a hemoglobin A1c and fasting lipid panel at his next office visit in one month.  08/24/14 He is here today for recheck.  Patient is overdue for colonoscopy as well as a PSA. I will schedule these today. Patient's immunizations are up-to-date. Fasting blood sugars range 1:30 to 170. Two-hour postprandial sugars range 200-250. He is currently on 70 units of Levemir in the morning and 40 units of Levemir in the evening. His INR today is therapeutic at 2.7. Otherwise he is doing well without any specific complaints. He denies any chest pain shortness of breath or dyspnea on exertion. He denies any myalgias or right upper quadrant pain. He has been taking Crestor to try to reduce his cholesterol. He is also trying to watch his diet and lose weight Past Medical History   Diagnosis Date  . Diabetes mellitus   . Hypertension   . Atrial fibrillation   . Cardiomyopathy   . Cerebrovascular accident   . Hyperlipidemia   . Seizures    Past Surgical History  Procedure Laterality Date  . Ear surgery as a child     Current Outpatient Prescriptions on File Prior to Visit  Medication Sig Dispense Refill  . amLODipine (NORVASC) 10 MG tablet TAKE ONE TABLET BY MOUTH ONCE DAILY 30 tablet 11  . aspirin 81 MG tablet Take 81 mg by mouth daily.    . B-D UF III MINI PEN NEEDLES 31G X 5 MM MISC   3  . carvedilol (COREG) 25 MG tablet Take 25 mg by mouth 2 (two) times daily with a meal.    . fenofibrate 160 MG tablet Take 1 tablet (160 mg total) by mouth daily. 30 tablet 5  . glucose blood (ONE TOUCH ULTRA TEST) test strip Pt checks BS qid Dx 250.00 200 each 5  . hydrALAZINE (APRESOLINE) 50 MG tablet TAKE 1 TABLET BY MOUTH THREE TIMES DAILY 270 tablet 2  . Insulin Detemir (LEVEMIR FLEXTOUCH) 100 UNIT/ML Pen INJECT 70 UNITS SUBCUTANEOUSLY EVERY DAY IN THE MORNING AND INJECT 40 UNITS EVERY DAY AT BEDTIME 45 pen 5  . Insulin Syringe-Needle U-100 (INSULIN SYRINGE .3CC/31GX5/16") 31G X 5/16" 0.3 ML MISC Use once daily to inject insulin.     Marland Kitchen  Insulin Syringes, Disposable, U-100 1 ML MISC by Does not apply route. ReliOn insulin syringes  Capacity - 3/20m - gauge - 30 - 864m   . levETIRAcetam (KEPPRA) 250 MG tablet Take 1 tablet (250 mg total) by mouth 2 (two) times daily. 60 tablet 4  . warfarin (COUMADIN) 10 MG tablet TAKE ONE TABLET BY MOUTH ONCE DAILY EXCEPT  '15MG'$   ON  FRIDAY (Patient taking differently: TAKE ONE TABLET BY MOUTH ONCE DAILY) 45 tablet 2   No current facility-administered medications on file prior to visit.   Allergies  Allergen Reactions  . Codeine Other (See Comments)    Unknown reaction   History   Social History  . Marital Status: Divorced    Spouse Name: N/A  . Number of Children: N/A  . Years of Education: N/A   Occupational History  . Not  on file.   Social History Main Topics  . Smoking status: Never Smoker   . Smokeless tobacco: Never Used  . Alcohol Use: No  . Drug Use: Not on file  . Sexual Activity: Not on file   Other Topics Concern  . Not on file   Social History Narrative   He has children.      Review of Systems  All other systems reviewed and are negative.      Objective:   Physical Exam  Cardiovascular: Normal rate, regular rhythm and normal heart sounds.   Pulmonary/Chest: Effort normal and breath sounds normal. No respiratory distress. He has no wheezes. He has no rales. He exhibits no tenderness.  Abdominal: Soft. Bowel sounds are normal. He exhibits no distension. There is no tenderness. There is no rebound.  Musculoskeletal: He exhibits no edema.  Vitals reviewed.         Assessment & Plan:  Diabetes mellitus type II, uncontrolled - Plan: COMPLETE METABOLIC PANEL WITH GFR, Lipid panel, Hemoglobin A1c, Microalbumin, urine  Atrial fibrillation, unspecified - Plan: PT with INR/Fingerstick  Colon cancer screening - Plan: Ambulatory referral to Gastroenterology  Prostate cancer screening - Plan: PSA  Patient's INR is therapeutic today. We will make no changes in his Coumadin dose. His blood pressure is well controlled today.  I will increase his Levemir to 60 units twice daily. Recheck fasting blood sugars and two-hour postprandial sugars in one week. Recheck Coumadin in one month. I will also check a PSA to screen for prostate cancer and will refer the patient to a gastroenterologist to screen for colon cancer.

## 2014-08-25 LAB — PSA: PSA: 0.99 ng/mL (ref <=4.00)

## 2014-09-06 ENCOUNTER — Other Ambulatory Visit: Payer: Self-pay | Admitting: Family Medicine

## 2014-09-06 MED ORDER — WARFARIN SODIUM 10 MG PO TABS: 10.0000 mg | ORAL_TABLET | Freq: Every day | ORAL | Status: DC

## 2014-09-06 NOTE — Telephone Encounter (Signed)
Medication refilled per protocol. 

## 2014-10-05 ENCOUNTER — Encounter: Payer: Self-pay | Admitting: Family Medicine

## 2014-10-05 ENCOUNTER — Ambulatory Visit (INDEPENDENT_AMBULATORY_CARE_PROVIDER_SITE_OTHER): Payer: Medicare Other | Admitting: Family Medicine

## 2014-10-05 VITALS — BP 140/78 | HR 76 | Temp 98.2°F | Resp 18 | Ht 69.0 in | Wt 221.0 lb

## 2014-10-05 DIAGNOSIS — I4891 Unspecified atrial fibrillation: Secondary | ICD-10-CM | POA: Diagnosis not present

## 2014-10-05 DIAGNOSIS — E785 Hyperlipidemia, unspecified: Secondary | ICD-10-CM

## 2014-10-05 LAB — PT WITH INR/FINGERSTICK
INR, fingerstick: 2.7 — ABNORMAL HIGH (ref 0.80–1.20)
PT, fingerstick: 32.6 seconds — ABNORMAL HIGH (ref 10.4–12.5)

## 2014-10-05 MED ORDER — ATORVASTATIN CALCIUM 20 MG PO TABS: 20.0000 mg | ORAL_TABLET | Freq: Every day | ORAL | Status: DC

## 2014-10-05 NOTE — Progress Notes (Signed)
Subjective:    Patient ID: Brian [name], male    DOB: April 01, 1955, 59 y.o.   MRN: [id number]  HPI 06/15/14 Patient has atrial fibrillation. Today he is in normal sinus rhythm. His INR today is supratherapeutic at 3.4. He is currently taking 15 mg on Sundays and Wednesdays and 10 mg on all other days.  At that time, my plan was: Decrease Coumadin dose to 10 mg every day except Sunday. On Sunday take 15 mg. Recheck INR in 4 weeks  07/16/14 Patient is here today for follow-up. His INR is still elevated at 3.1. He is currently taking 10 mg of Coumadin every day except Sunday. On Sunday he takes 15 mg. He has not missed any doses of his Coumadin.  Patient's blood pressure is very good.  Fasting blood sugars are under 130. He is not checking 2 hour postprandial sugars regularly. He is due for hemoglobin A1c and fasting lipid panel in June.  At that time, my plan was:  decrease Coumadin to 10 mg every day and recheck INR in one month. I have asked the patient to check fasting blood sugars and two-hour postprandial sugars and bring the values to me in one week. If he is having postprandial hyperglycemia, I would start the patient on Januvia. Recheck a hemoglobin A1c and fasting lipid panel at his next office visit in one month.  08/24/14 He is here today for recheck.  Patient is overdue for colonoscopy as well as a PSA. I will schedule these today. Patient's immunizations are up-to-date. Fasting blood sugars range 1:30 to 170. Two-hour postprandial sugars range 200-250. He is currently on 70 units of Levemir in the morning and 40 units of Levemir in the evening. His INR today is therapeutic at 2.7. Otherwise he is doing well without any specific complaints. He denies any chest pain shortness of breath or dyspnea on exertion. He denies any myalgias or right upper quadrant pain. He has been taking Crestor to try to reduce his cholesterol. He is also trying to watch his diet and lose weight.  At that time, my  plan was: Patient's INR is therapeutic today. We will make no changes in his Coumadin dose. His blood pressure is well controlled today.  I will increase his Levemir to 60 units twice daily. Recheck fasting blood sugars and two-hour postprandial sugars in one week. Recheck Coumadin in one month. I will also check a PSA to screen for prostate cancer and will refer the patient to a gastroenterologist to screen for colon cancer.  10/05/14 Patient's hemoglobin A1c was relatively good at 7.2. His creatinine was stable at 1.3. However his cholesterol is significant for an LDL cholesterol greater than 100, triglycerides greater than 250, and HDL cholesterol less than 40. I suggested Lipitor and has yet to start the medication because he is afraid of it. He is here today to check an INR. His INR is therapeutic at 2.7 Past Medical History  Diagnosis Date  . Diabetes mellitus   . Hypertension   . Atrial fibrillation   . Cardiomyopathy   . Cerebrovascular accident   . Hyperlipidemia   . Seizures    Past Surgical History  Procedure Laterality Date  . Ear surgery as a child     Current Outpatient Prescriptions on File Prior to Visit  Medication Sig Dispense Refill  . amLODipine (NORVASC) 10 MG tablet TAKE ONE TABLET BY MOUTH ONCE DAILY 30 tablet 11  . aspirin 81 MG tablet Take 81 mg by  mouth daily.    . B-D UF III MINI PEN NEEDLES 31G X 5 MM MISC   3  . carvedilol (COREG) 25 MG tablet Take 25 mg by mouth 2 (two) times daily with a meal.    . fenofibrate 160 MG tablet Take 1 tablet (160 mg total) by mouth daily. 30 tablet 5  . glucose blood (ONE TOUCH ULTRA TEST) test strip Pt checks BS qid Dx 250.00 200 each 5  . hydrALAZINE (APRESOLINE) 50 MG tablet TAKE 1 TABLET BY MOUTH THREE TIMES DAILY 270 tablet 2  . Insulin Detemir (LEVEMIR FLEXTOUCH) 100 UNIT/ML Pen INJECT 70 UNITS SUBCUTANEOUSLY EVERY DAY IN THE MORNING AND INJECT 40 UNITS EVERY DAY AT BEDTIME 45 pen 5  . Insulin Syringe-Needle U-100 (INSULIN  SYRINGE .3CC/31GX5/16") 31G X 5/16" 0.3 ML MISC Use once daily to inject insulin.     . Insulin Syringes, Disposable, U-100 1 ML MISC by Does not apply route. ReliOn insulin syringes  Capacity - 3/67m - gauge - 30 - 854m   . warfarin (COUMADIN) 10 MG tablet Take 1 tablet (10 mg total) by mouth daily. 30 tablet 2   No current facility-administered medications on file prior to visit.   Allergies  Allergen Reactions  . Codeine Other (See Comments)    Unknown reaction   Social History   Social History  . Marital Status: Divorced    Spouse Name: N/A  . Number of Children: N/A  . Years of Education: N/A   Occupational History  . Not on file.   Social History Main Topics  . Smoking status: Never Smoker   . Smokeless tobacco: Never Used  . Alcohol Use: No  . Drug Use: Not on file  . Sexual Activity: Not on file   Other Topics Concern  . Not on file   Social History Narrative   He has children.      Review of Systems  All other systems reviewed and are negative.      Objective:   Physical Exam  Cardiovascular: Normal rate, regular rhythm and normal heart sounds.   Pulmonary/Chest: Effort normal and breath sounds normal. No respiratory distress. He has no wheezes. He has no rales. He exhibits no tenderness.  Abdominal: Soft. Bowel sounds are normal. He exhibits no distension. There is no tenderness. There is no rebound.  Musculoskeletal: He exhibits no edema.  Vitals reviewed.         Assessment & Plan:  HLD (hyperlipidemia) - Plan: atorvastatin (LIPITOR) 20 MG tablet  Atrial fibrillation, unspecified - Plan: PT with INR/Fingerstick  INR is therapeutic. Make no change in his Coumadin and recheck INR in 6 weeks. I convinced the patient to try Lipitor 20 mg by mouth daily in addition to his fenofibrate. His goal LDL cholesterol is less than 100. His goal HDL cholesterol greater than 40. I encouraged the patient try to begin exercising aerobically ultimately for 30  minutes a day 5 days a week. I would like to see his triglycerides closer to 250. Recheck a fasting lipid panel in 3 months

## 2014-11-11 ENCOUNTER — Other Ambulatory Visit: Payer: Self-pay | Admitting: Family Medicine

## 2014-11-11 MED ORDER — CARVEDILOL 25 MG PO TABS: 25.0000 mg | ORAL_TABLET | Freq: Two times a day (BID) | ORAL | Status: DC

## 2014-11-11 MED ORDER — AMLODIPINE BESYLATE 10 MG PO TABS: 10.0000 mg | ORAL_TABLET | Freq: Every day | ORAL | Status: DC

## 2014-11-11 NOTE — Telephone Encounter (Signed)
Medication called/sent to requested pharmacy  

## 2014-11-16 ENCOUNTER — Encounter: Payer: Self-pay | Admitting: Family Medicine

## 2014-11-16 ENCOUNTER — Ambulatory Visit (INDEPENDENT_AMBULATORY_CARE_PROVIDER_SITE_OTHER): Payer: Medicare Other | Admitting: Family Medicine

## 2014-11-16 VITALS — BP 122/72 | HR 78 | Temp 98.0°F | Resp 16 | Ht 69.0 in | Wt 218.0 lb

## 2014-11-16 DIAGNOSIS — I4891 Unspecified atrial fibrillation: Secondary | ICD-10-CM | POA: Diagnosis not present

## 2014-11-16 DIAGNOSIS — E785 Hyperlipidemia, unspecified: Secondary | ICD-10-CM | POA: Diagnosis not present

## 2014-11-16 LAB — PT WITH INR/FINGERSTICK
INR FINGERSTICK: 3.3 — AB (ref 0.80–1.20)
PT FINGERSTICK: 39.5 s — AB (ref 10.4–12.5)

## 2014-11-16 MED ORDER — ATORVASTATIN CALCIUM 20 MG PO TABS: 20.0000 mg | ORAL_TABLET | Freq: Every day | ORAL | Status: DC

## 2014-11-16 NOTE — Progress Notes (Signed)
Subjective:    Patient ID: Brian [name], male    DOB: May 18, 1955, 59 y.o.   MRN: [id number]  HPI 06/15/14 Patient has atrial fibrillation. Today he is in normal sinus rhythm. His INR today is supratherapeutic at 3.4. He is currently taking 15 mg on Sundays and Wednesdays and 10 mg on all other days.  At that time, my plan was: Decrease Coumadin dose to 10 mg every day except Sunday. On Sunday take 15 mg. Recheck INR in 4 weeks  07/16/14 Patient is here today for follow-up. His INR is still elevated at 3.1. He is currently taking 10 mg of Coumadin every day except Sunday. On Sunday he takes 15 mg. He has not missed any doses of his Coumadin.  Patient's blood pressure is very good.  Fasting blood sugars are under 130. He is not checking 2 hour postprandial sugars regularly. He is due for hemoglobin A1c and fasting lipid panel in June.  At that time, my plan was:  decrease Coumadin to 10 mg every day and recheck INR in one month. I have asked the patient to check fasting blood sugars and two-hour postprandial sugars and bring the values to me in one week. If he is having postprandial hyperglycemia, I would start the patient on Januvia. Recheck a hemoglobin A1c and fasting lipid panel at his next office visit in one month.  08/24/14 He is here today for recheck.  Patient is overdue for colonoscopy as well as a PSA. I will schedule these today. Patient's immunizations are up-to-date. Fasting blood sugars range 1:30 to 170. Two-hour postprandial sugars range 200-250. He is currently on 70 units of Levemir in the morning and 40 units of Levemir in the evening. His INR today is therapeutic at 2.7. Otherwise he is doing well without any specific complaints. He denies any chest pain shortness of breath or dyspnea on exertion. He denies any myalgias or right upper quadrant pain. He has been taking Crestor to try to reduce his cholesterol. He is also trying to watch his diet and lose weight.  At that time, my  plan was: Patient's INR is therapeutic today. We will make no changes in his Coumadin dose. His blood pressure is well controlled today.  I will increase his Levemir to 60 units twice daily. Recheck fasting blood sugars and two-hour postprandial sugars in one week. Recheck Coumadin in one month. I will also check a PSA to screen for prostate cancer and will refer the patient to a gastroenterologist to screen for colon cancer.  10/05/14 Patient's hemoglobin A1c was relatively good at 7.2. His creatinine was stable at 1.3. However his cholesterol is significant for an LDL cholesterol greater than 100, triglycerides greater than 250, and HDL cholesterol less than 40. I suggested Lipitor and has yet to start the medication because he is afraid of it. He is here today to check an INR. His INR is therapeutic at 2.7.  At that time, my plan was:  INR is therapeutic. Make no change in his Coumadin and recheck INR in 6 weeks. I convinced the patient to try Lipitor 20 mg by mouth daily in addition to his fenofibrate. His goal LDL cholesterol is less than 100. His goal HDL cholesterol greater than 40. I encouraged the patient try to begin exercising aerobically ultimately for 30 minutes a day 5 days a week. I would like to see his triglycerides closer to 250. Recheck a fasting lipid panel in 3 months  11/16/14 INR today is  3.3 and supratherapeutic. He is taking 10 mg of Coumadin every day. He is also requesting samples of Viagra Past Medical History  Diagnosis Date  . Diabetes mellitus   . Hypertension   . Atrial fibrillation   . Cardiomyopathy   . Cerebrovascular accident   . Hyperlipidemia   . Seizures    Past Surgical History  Procedure Laterality Date  . Ear surgery as a child     Current Outpatient Prescriptions on File Prior to Visit  Medication Sig Dispense Refill  . amLODipine (NORVASC) 10 MG tablet Take 1 tablet (10 mg total) by mouth daily. 30 tablet 11  . aspirin 81 MG tablet Take 81 mg by  mouth daily.    . B-D UF III MINI PEN NEEDLES 31G X 5 MM MISC   3  . carvedilol (COREG) 25 MG tablet Take 1 tablet (25 mg total) by mouth 2 (two) times daily with a meal. 60 tablet 11  . fenofibrate 160 MG tablet Take 1 tablet (160 mg total) by mouth daily. 30 tablet 5  . glucose blood (ONE TOUCH ULTRA TEST) test strip Pt checks BS qid Dx 250.00 200 each 5  . hydrALAZINE (APRESOLINE) 50 MG tablet TAKE 1 TABLET BY MOUTH THREE TIMES DAILY 270 tablet 2  . Insulin Detemir (LEVEMIR FLEXTOUCH) 100 UNIT/ML Pen INJECT 70 UNITS SUBCUTANEOUSLY EVERY DAY IN THE MORNING AND INJECT 40 UNITS EVERY DAY AT BEDTIME 45 pen 5  . Insulin Syringe-Needle U-100 (INSULIN SYRINGE .3CC/31GX5/16") 31G X 5/16" 0.3 ML MISC Use once daily to inject insulin.     . Insulin Syringes, Disposable, U-100 1 ML MISC by Does not apply route. ReliOn insulin syringes  Capacity - 3/22m - gauge - 30 - 843m   . warfarin (COUMADIN) 10 MG tablet Take 1 tablet (10 mg total) by mouth daily. 30 tablet 2   No current facility-administered medications on file prior to visit.   Allergies  Allergen Reactions  . Codeine Other (See Comments)    Unknown reaction   Social History   Social History  . Marital Status: Divorced    Spouse Name: N/A  . Number of Children: N/A  . Years of Education: N/A   Occupational History  . Not on file.   Social History Main Topics  . Smoking status: Never Smoker   . Smokeless tobacco: Never Used  . Alcohol Use: No  . Drug Use: Not on file  . Sexual Activity: Not on file   Other Topics Concern  . Not on file   Social History Narrative   He has children.      Review of Systems  All other systems reviewed and are negative.      Objective:   Physical Exam  Cardiovascular: Normal rate, regular rhythm and normal heart sounds.   Pulmonary/Chest: Effort normal and breath sounds normal. No respiratory distress. He has no wheezes. He has no rales. He exhibits no tenderness.  Abdominal: Soft.  Bowel sounds are normal. He exhibits no distension. There is no tenderness. There is no rebound.  Musculoskeletal: He exhibits no edema.  Vitals reviewed.         Assessment & Plan:  HLD (hyperlipidemia) - Plan: atorvastatin (LIPITOR) 20 MG tablet  Atrial fibrillation, unspecified - Plan: PT with INR/Fingerstick  hold Coumadin for 24 hours. Then resume present dose of Coumadin. Recheck in 4 weeks. Slightly increase the vitamin K in his diet. I did give the patient samples of 100 mg Viagra tablets with  explanation on how to take it.

## 2014-12-06 ENCOUNTER — Other Ambulatory Visit: Payer: Self-pay | Admitting: Family Medicine

## 2014-12-06 NOTE — Telephone Encounter (Signed)
Refill appropriate and filled per protocol. 

## 2014-12-28 ENCOUNTER — Ambulatory Visit (INDEPENDENT_AMBULATORY_CARE_PROVIDER_SITE_OTHER): Payer: Medicare Other | Admitting: Family Medicine

## 2014-12-28 ENCOUNTER — Other Ambulatory Visit: Payer: Self-pay | Admitting: Family Medicine

## 2014-12-28 ENCOUNTER — Encounter: Payer: Self-pay | Admitting: Family Medicine

## 2014-12-28 VITALS — BP 132/80 | HR 68 | Temp 98.2°F | Resp 20 | Wt 213.0 lb

## 2014-12-28 DIAGNOSIS — I4891 Unspecified atrial fibrillation: Secondary | ICD-10-CM

## 2014-12-28 DIAGNOSIS — Z1211 Encounter for screening for malignant neoplasm of colon: Secondary | ICD-10-CM

## 2014-12-28 LAB — PT WITH INR/FINGERSTICK
INR FINGERSTICK: 3.1 — AB (ref 0.80–1.20)
PT FINGERSTICK: 36.8 s — AB (ref 10.4–12.5)

## 2014-12-28 MED ORDER — INSULIN DETEMIR 100 UNIT/ML FLEXPEN: PEN_INJECTOR | SUBCUTANEOUS | Status: DC

## 2014-12-28 NOTE — Progress Notes (Signed)
Subjective:    Patient ID: Brian [name], male    DOB: [birth year], 59 y.o.   MRN: [id number]  HPI 06/15/14 Patient has atrial fibrillation. Today he is in normal sinus rhythm. His INR today is supratherapeutic at 3.4. He is currently taking 15 mg on Sundays and Wednesdays and 10 mg on all other days.  At that time, my plan was: Decrease Coumadin dose to 10 mg every day except Sunday. On Sunday take 15 mg. Recheck INR in 4 weeks  07/16/14 Patient is here today for follow-up. His INR is still elevated at 3.1. He is currently taking 10 mg of Coumadin every day except Sunday. On Sunday he takes 15 mg. He has not missed any doses of his Coumadin.  Patient's blood pressure is very good.  Fasting blood sugars are under 130. He is not checking 2 hour postprandial sugars regularly. He is due for hemoglobin A1c and fasting lipid panel in June.  At that time, my plan was:  decrease Coumadin to 10 mg every day and recheck INR in one month. I have asked the patient to check fasting blood sugars and two-hour postprandial sugars and bring the values to me in one week. If he is having postprandial hyperglycemia, I would start the patient on Januvia. Recheck a hemoglobin A1c and fasting lipid panel at his next office visit in one month.  08/24/14 He is here today for recheck.  Patient is overdue for colonoscopy as well as a PSA. I will schedule these today. Patient's immunizations are up-to-date. Fasting blood sugars range 1:30 to 170. Two-hour postprandial sugars range 200-250. He is currently on 70 units of Levemir in the morning and 40 units of Levemir in the evening. His INR today is therapeutic at 2.7. Otherwise he is doing well without any specific complaints. He denies any chest pain shortness of breath or dyspnea on exertion. He denies any myalgias or right upper quadrant pain. He has been taking Crestor to try to reduce his cholesterol. He is also trying to watch his diet and lose weight.  At that time, my  plan was: Patient's INR is therapeutic today. We will make no changes in his Coumadin dose. His blood pressure is well controlled today.  I will increase his Levemir to 60 units twice daily. Recheck fasting blood sugars and two-hour postprandial sugars in one week. Recheck Coumadin in one month. I will also check a PSA to screen for prostate cancer and will refer the patient to a gastroenterologist to screen for colon cancer.  10/05/14 Patient's hemoglobin A1c was relatively good at 7.2. His creatinine was stable at 1.3. However his cholesterol is significant for an LDL cholesterol greater than 100, triglycerides greater than 250, and HDL cholesterol less than 40. I suggested Lipitor and has yet to start the medication because he is afraid of it. He is here today to check an INR. His INR is therapeutic at 2.7.  At that time, my plan was:  INR is therapeutic. Make no change in his Coumadin and recheck INR in 6 weeks. I convinced the patient to try Lipitor 20 mg by mouth daily in addition to his fenofibrate. His goal LDL cholesterol is less than 100. His goal HDL cholesterol greater than 40. I encouraged the patient try to begin exercising aerobically ultimately for 30 minutes a day 5 days a week. I would like to see his triglycerides closer to 250. Recheck a fasting lipid panel in 3 months  11/16/14 INR today is  3.3 and supratherapeutic. He is taking 10 mg of Coumadin every day. He is also requesting samples of Viagra.  AT that time, my plan was: hold Coumadin for 24 hours. Then resume present dose of Coumadin. Recheck in 4 weeks. Slightly increase the vitamin K in his diet. I did give the patient samples of 100 mg Viagra tablets with explanation on how to take it.  12/28/14 He is here to recheck his INR.  His INR is supratherapeutic at 3.1. He continues to take 10 mg of Coumadin every day. His blood pressure today is excellent. He is only been on Lipitor for one month and therefore he is not due yet to  recheck a fasting lipid panel. I scheduled patient for colonoscopy back in July. However apparently that referral was canceled. He still needs a colonoscopy  Past Medical History  Diagnosis Date  . Diabetes mellitus   . Hypertension   . Atrial fibrillation   . Cardiomyopathy   . Cerebrovascular accident   . Hyperlipidemia   . Seizures    Past Surgical History  Procedure Laterality Date  . Ear surgery as a child     Current Outpatient Prescriptions on File Prior to Visit  Medication Sig Dispense Refill  . amLODipine (NORVASC) 10 MG tablet Take 1 tablet (10 mg total) by mouth daily. 30 tablet 11  . aspirin 81 MG tablet Take 81 mg by mouth daily.    Marland Kitchen atorvastatin (LIPITOR) 20 MG tablet Take 1 tablet (20 mg total) by mouth daily. 90 tablet 3  . B-D UF III MINI PEN NEEDLES 31G X 5 MM MISC   3  . carvedilol (COREG) 25 MG tablet Take 1 tablet (25 mg total) by mouth 2 (two) times daily with a meal. 60 tablet 11  . fenofibrate 160 MG tablet Take 1 tablet (160 mg total) by mouth daily. 30 tablet 5  . glucose blood (ONE TOUCH ULTRA TEST) test strip Pt checks BS qid Dx 250.00 200 each 5  . hydrALAZINE (APRESOLINE) 50 MG tablet TAKE 1 TABLET BY MOUTH THREE TIMES DAILY 270 tablet 2  . Insulin Detemir (LEVEMIR FLEXTOUCH) 100 UNIT/ML Pen INJECT 70 UNITS SUBCUTANEOUSLY EVERY DAY IN THE MORNING AND INJECT 40 UNITS EVERY DAY AT BEDTIME 45 pen 5  . Insulin Syringe-Needle U-100 (INSULIN SYRINGE .3CC/31GX5/16") 31G X 5/16" 0.3 ML MISC Use once daily to inject insulin.     . Insulin Syringes, Disposable, U-100 1 ML MISC by Does not apply route. ReliOn insulin syringes  Capacity - 3/81m - gauge - 30 - 835m   . warfarin (COUMADIN) 10 MG tablet TAKE 1 TABLET(10 MG) BY MOUTH DAILY 30 tablet 0   No current facility-administered medications on file prior to visit.   Allergies  Allergen Reactions  . Codeine Other (See Comments)    Unknown reaction   Social History   Social History  . Marital Status:  Divorced    Spouse Name: N/A  . Number of Children: N/A  . Years of Education: N/A   Occupational History  . Not on file.   Social History Main Topics  . Smoking status: Never Smoker   . Smokeless tobacco: Never Used  . Alcohol Use: No  . Drug Use: Not on file  . Sexual Activity: Not on file   Other Topics Concern  . Not on file   Social History Narrative   He has children.      Review of Systems  All other systems reviewed and are  negative.      Objective:   Physical Exam  Cardiovascular: Normal rate, regular rhythm and normal heart sounds.   Pulmonary/Chest: Effort normal and breath sounds normal. No respiratory distress. He has no wheezes. He has no rales. He exhibits no tenderness.  Abdominal: Soft. Bowel sounds are normal. He exhibits no distension. There is no tenderness. There is no rebound.  Musculoskeletal: He exhibits no edema.  Vitals reviewed.         Assessment & Plan:  Atrial fibrillation, unspecified - Plan: PT with INR/Fingerstick  Colon cancer screening - Plan: Ambulatory referral to Gastroenterology  Decrease Coumadin to 10 mg every day and 5 mg only on Wednesday. I will schedule the patient again for colonoscopy. I would like him to come in fasting in January so that we can check a fasting lipid panel along with a CMP and hemoglobin A1c. I will refill his insulin at the present time. His blood pressures acceptable.

## 2014-12-31 ENCOUNTER — Encounter: Payer: Self-pay | Admitting: Gastroenterology

## 2015-01-07 ENCOUNTER — Other Ambulatory Visit: Payer: Self-pay | Admitting: Family Medicine

## 2015-01-07 MED ORDER — INSULIN DETEMIR 100 UNIT/ML ~~LOC~~ SOLN: SUBCUTANEOUS | Status: DC

## 2015-01-07 MED ORDER — "INSULIN SYRINGE 31G X 5/16"" 0.3 ML MISC": Status: DC

## 2015-01-07 NOTE — Telephone Encounter (Signed)
Medication refilled per protocol. 

## 2015-01-10 ENCOUNTER — Telehealth: Payer: Self-pay | Admitting: Family Medicine

## 2015-01-10 MED ORDER — "INSULIN SYRINGE 31G X 5/16"" 0.3 ML MISC": Status: DC

## 2015-01-10 NOTE — Telephone Encounter (Signed)
Medication called/sent to requested pharmacy  

## 2015-01-10 NOTE — Telephone Encounter (Signed)
Pt called requesting needles for his insulin syringes.  He uses the [facility] on [address]. [phone]

## 2015-02-08 ENCOUNTER — Ambulatory Visit (INDEPENDENT_AMBULATORY_CARE_PROVIDER_SITE_OTHER): Payer: Medicare Other | Admitting: Family Medicine

## 2015-02-08 ENCOUNTER — Encounter: Payer: Self-pay | Admitting: Family Medicine

## 2015-02-08 VITALS — BP 136/80 | HR 70 | Temp 98.3°F | Resp 18 | Ht 69.0 in | Wt 213.0 lb

## 2015-02-08 DIAGNOSIS — I4891 Unspecified atrial fibrillation: Secondary | ICD-10-CM

## 2015-02-08 DIAGNOSIS — E119 Type 2 diabetes mellitus without complications: Secondary | ICD-10-CM

## 2015-02-08 DIAGNOSIS — Z794 Long term (current) use of insulin: Secondary | ICD-10-CM | POA: Diagnosis not present

## 2015-02-08 LAB — COMPLETE METABOLIC PANEL WITH GFR
ALBUMIN: 4 g/dL (ref 3.6–5.1)
ALK PHOS: 76 U/L (ref 40–115)
ALT: 34 U/L (ref 9–46)
AST: 22 U/L (ref 10–35)
BUN: 18 mg/dL (ref 7–25)
CALCIUM: 8.9 mg/dL (ref 8.6–10.3)
CO2: 24 mmol/L (ref 20–31)
Chloride: 103 mmol/L (ref 98–110)
Creat: 1.43 mg/dL — ABNORMAL HIGH (ref 0.70–1.25)
GFR, EST AFRICAN AMERICAN: 61 mL/min (ref >=60)
GFR, Est Non African American: 52 mL/min — ABNORMAL LOW (ref >=60)
Glucose, Bld: 201 mg/dL — ABNORMAL HIGH (ref 70–99)
POTASSIUM: 4.4 mmol/L (ref 3.5–5.3)
Sodium: 138 mmol/L (ref 135–146)
Total Bilirubin: 0.6 mg/dL (ref 0.2–1.2)
Total Protein: 6.6 g/dL (ref 6.1–8.1)

## 2015-02-08 LAB — LIPID PANEL
Cholesterol: 163 mg/dL (ref 125–200)
HDL: 29 mg/dL — ABNORMAL LOW (ref >=40)
LDL CALC: 81 mg/dL (ref <130)
TRIGLYCERIDES: 267 mg/dL — AB (ref <150)
Total CHOL/HDL Ratio: 5.6 Ratio — ABNORMAL HIGH (ref <=5.0)
VLDL: 53 mg/dL — ABNORMAL HIGH (ref <30)

## 2015-02-08 LAB — PT WITH INR/FINGERSTICK
INR, fingerstick: 1 (ref 0.80–1.20)
PT, fingerstick: 12.4 seconds (ref 10.4–12.5)

## 2015-02-08 NOTE — Progress Notes (Signed)
Subjective:    Patient ID: Brian [name], male    DOB: 1955-10-16, 59 y.o.   MRN: [id number]  HPI 06/15/14 Patient has atrial fibrillation. Today he is in normal sinus rhythm. His INR today is supratherapeutic at 3.4. He is currently taking 15 mg on Sundays and Wednesdays and 10 mg on all other days. At that time, my plan was: Decrease Coumadin dose to 10 mg every day except Sunday. On Sunday take 15 mg. Recheck INR in 4 weeks  07/16/14 Patient is here today for follow-up. His INR is still elevated at 3.1. He is currently taking 10 mg of Coumadin every day except Sunday. On Sunday he takes 15 mg. He has not missed any doses of his Coumadin.  Patient's blood pressure is very good.  Fasting blood sugars are under 130. He is not checking 2 hour postprandial sugars regularly. He is due for hemoglobin A1c and fasting lipid panel in June.  At that time, my plan was:  decrease Coumadin to 10 mg every day and recheck INR in one month. I have asked the patient to check fasting blood sugars and two-hour postprandial sugars and bring the values to me in one week. If he is having postprandial hyperglycemia, I would start the patient on Januvia. Recheck a hemoglobin A1c and fasting lipid panel at his next office visit in one month.  08/24/14 He is here today for recheck.  Patient is overdue for colonoscopy as well as a PSA. I will schedule these today. Patient's immunizations are up-to-date. Fasting blood sugars range 1:30 to 170. Two-hour postprandial sugars range 200-250. He is currently on 70 units of Levemir in the morning and 40 units of Levemir in the evening. His INR today is therapeutic at 2.7. Otherwise he is doing well without any specific complaints. He denies any chest pain shortness of breath or dyspnea on exertion. He denies any myalgias or right upper quadrant pain. He has been taking Crestor to try to reduce his cholesterol. He is also trying to watch his diet and lose weight.  At that time, my  plan was: Patient's INR is therapeutic today. We will make no changes in his Coumadin dose. His blood pressure is well controlled today.  I will increase his Levemir to 60 units twice daily. Recheck fasting blood sugars and two-hour postprandial sugars in one week. Recheck Coumadin in one month. I will also check a PSA to screen for prostate cancer and will refer the patient to a gastroenterologist to screen for colon cancer.  10/05/14 Patient's hemoglobin A1c was relatively good at 7.2. His creatinine was stable at 1.3. However his cholesterol is significant for an LDL cholesterol greater than 100, triglycerides greater than 250, and HDL cholesterol less than 40. I suggested Lipitor and has yet to start the medication because he is afraid of it. He is here today to check an INR. His INR is therapeutic at 2.7.  At that time, my plan was:  INR is therapeutic. Make no change in his Coumadin and recheck INR in 6 weeks. I convinced the patient to try Lipitor 20 mg by mouth daily in addition to his fenofibrate. His goal LDL cholesterol is less than 100. His goal HDL cholesterol greater than 40. I encouraged the patient try to begin exercising aerobically ultimately for 30 minutes a day 5 days a week. I would like to see his triglycerides closer to 250. Recheck a fasting lipid panel in 3 months  11/16/14 INR today is 3.3  and supratherapeutic. He is taking 10 mg of Coumadin every day. He is also requesting samples of Viagra.  AT that time, my plan was: hold Coumadin for 24 hours. Then resume present dose of Coumadin. Recheck in 4 weeks. Slightly increase the vitamin K in his diet. I did give the patient samples of 100 mg Viagra tablets with explanation on how to take it.  12/28/14 He is here to recheck his INR.  His INR is supratherapeutic at 3.1. He continues to take 10 mg of Coumadin every day. His blood pressure today is excellent. He is only been on Lipitor for one month and therefore he is not due yet to  recheck a fasting lipid panel. I scheduled patient for colonoscopy back in July. However apparently that referral was canceled. He still needs a colonoscopy.  At that time, my plan was: Decrease Coumadin to 10 mg every day and 5 mg only on Wednesday. I will schedule the patient again for colonoscopy. I would like him to come in fasting in January so that we can check a fasting lipid panel along with a CMP and hemoglobin A1c. I will refill his insulin at the present time. His blood pressures acceptable.  02/08/15 Patient's INR today is subtherapeutic at 1.0.  He admits that he quit coumadin 3 weeks ago because he felt cold.  I explained that coumadin does not cause that and the necessity to take coumadin to prevent a stroke due to his paroxysmal a fib.  He is overdue for fasting lab work to monitor his diabetes and his cholesterol.  He reports fbs 120-140 and 2 hour post prandial sugars less than 160.  He denies hypoglycemia.  He denies cp, sob, doe.  Overdue for eye exam.  Colonoscopy scheduled for January.    Past Medical History  Diagnosis Date  . Diabetes mellitus   . Hypertension   . Atrial fibrillation ([place])   . Cardiomyopathy   . Cerebrovascular accident ([place])   . Hyperlipidemia   . Seizures [facility])    Past Surgical History  Procedure Laterality Date  . Ear surgery as a child     Current Outpatient Prescriptions on File Prior to Visit  Medication Sig Dispense Refill  . amLODipine (NORVASC) 10 MG tablet Take 1 tablet (10 mg total) by mouth daily. 30 tablet 11  . aspirin 81 MG tablet Take 81 mg by mouth daily.    Marland Kitchen atorvastatin (LIPITOR) 20 MG tablet Take 1 tablet (20 mg total) by mouth daily. 90 tablet 3  . carvedilol (COREG) 25 MG tablet Take 1 tablet (25 mg total) by mouth 2 (two) times daily with a meal. 60 tablet 11  . fenofibrate 160 MG tablet Take 1 tablet (160 mg total) by mouth daily. 30 tablet 5  . glucose blood (ONE TOUCH ULTRA TEST) test strip Pt checks BS qid Dx 250.00 200  each 5  . hydrALAZINE (APRESOLINE) 50 MG tablet TAKE 1 TABLET BY MOUTH THREE TIMES DAILY 270 tablet 2  . insulin detemir (LEVEMIR) 100 UNIT/ML injection 70 units in the AM, 40 units at bedtime 30 mL 11  . Insulin Syringe-Needle U-100 (INSULIN SYRINGE .3CC/31GX5/16") 31G X 5/16" 0.3 ML MISC Use twice daily to inject insulin. 100 each 10  . warfarin (COUMADIN) 10 MG tablet TAKE 1 TABLET(10 MG) BY MOUTH DAILY 30 tablet 0   No current facility-administered medications on file prior to visit.   Allergies  Allergen Reactions  . Codeine Other (See Comments)    Unknown  reaction   Social History   Social History  . Marital Status: Divorced    Spouse Name: N/A  . Number of Children: N/A  . Years of Education: N/A   Occupational History  . Not on file.   Social History Main Topics  . Smoking status: Never Smoker   . Smokeless tobacco: Never Used  . Alcohol Use: No  . Drug Use: Not on file  . Sexual Activity: Not on file   Other Topics Concern  . Not on file   Social History Narrative   He has children.      Review of Systems  All other systems reviewed and are negative.      Objective:   Physical Exam  Cardiovascular: Normal rate, regular rhythm and normal heart sounds.   Pulmonary/Chest: Effort normal and breath sounds normal. No respiratory distress. He has no wheezes. He has no rales. He exhibits no tenderness.  Abdominal: Soft. Bowel sounds are normal. He exhibits no distension. There is no tenderness. There is no rebound.  Musculoskeletal: He exhibits no edema.  Vitals reviewed.         Assessment & Plan:  Controlled type 2 diabetes mellitus without complication, with long-term current use of insulin ([place]) - Plan: COMPLETE METABOLIC PANEL WITH GFR, Hemoglobin A1c, Lipid panel, Microalbumin, urine, Ambulatory referral to Ophthalmology  Atrial fibrillation, unspecified - Plan: PT with INR/Fingerstick  Resume coumadin 10 mg poqday and recheck in 4 weeks.  Check  Hga1c, cmp, flp, and urine microalbumin.  Schedule diabetic eye exam.  Colonoscopy pending.  Flu shot and pneumovax/prevnar are up to date.

## 2015-02-09 ENCOUNTER — Telehealth: Payer: Self-pay | Admitting: *Deleted

## 2015-02-09 LAB — HEMOGLOBIN A1C
HEMOGLOBIN A1C: 8.3 % — AB (ref <5.7)
MEAN PLASMA GLUCOSE: 192 mg/dL — AB (ref <117)

## 2015-02-09 NOTE — Telephone Encounter (Signed)
Pt has appt scheduled at [place] on Jan 10 at 8am with Dr. Zigmund Daniel, lmtrc to pt for appt informatin

## 2015-02-10 ENCOUNTER — Other Ambulatory Visit: Payer: Self-pay | Admitting: Family Medicine

## 2015-02-10 NOTE — Telephone Encounter (Signed)
Refill appropriate and filled per protocol. 

## 2015-02-22 NOTE — Telephone Encounter (Signed)
Pt mom Stanton Kidney aware of appt information and will let pt know of appt

## 2015-02-28 ENCOUNTER — Encounter (INDEPENDENT_AMBULATORY_CARE_PROVIDER_SITE_OTHER): Payer: Medicare Other | Admitting: Ophthalmology

## 2015-02-28 DIAGNOSIS — E113392 Type 2 diabetes mellitus with moderate nonproliferative diabetic retinopathy without macular edema, left eye: Secondary | ICD-10-CM

## 2015-02-28 DIAGNOSIS — E11319 Type 2 diabetes mellitus with unspecified diabetic retinopathy without macular edema: Secondary | ICD-10-CM | POA: Diagnosis not present

## 2015-02-28 DIAGNOSIS — H35033 Hypertensive retinopathy, bilateral: Secondary | ICD-10-CM

## 2015-02-28 DIAGNOSIS — I10 Essential (primary) hypertension: Secondary | ICD-10-CM

## 2015-02-28 DIAGNOSIS — H43813 Vitreous degeneration, bilateral: Secondary | ICD-10-CM | POA: Diagnosis not present

## 2015-02-28 DIAGNOSIS — E113591 Type 2 diabetes mellitus with proliferative diabetic retinopathy without macular edema, right eye: Secondary | ICD-10-CM | POA: Diagnosis not present

## 2015-02-28 DIAGNOSIS — H2513 Age-related nuclear cataract, bilateral: Secondary | ICD-10-CM

## 2015-02-28 LAB — HM DIABETES EYE EXAM

## 2015-03-01 ENCOUNTER — Encounter (INDEPENDENT_AMBULATORY_CARE_PROVIDER_SITE_OTHER): Payer: Medicare Other | Admitting: Ophthalmology

## 2015-03-02 ENCOUNTER — Ambulatory Visit (INDEPENDENT_AMBULATORY_CARE_PROVIDER_SITE_OTHER): Payer: Medicare Other | Admitting: Gastroenterology

## 2015-03-02 ENCOUNTER — Encounter: Payer: Self-pay | Admitting: Gastroenterology

## 2015-03-02 VITALS — BP 170/100 | HR 88 | Ht 69.0 in | Wt 214.4 lb

## 2015-03-02 DIAGNOSIS — Z7901 Long term (current) use of anticoagulants: Secondary | ICD-10-CM | POA: Diagnosis not present

## 2015-03-02 DIAGNOSIS — Z1211 Encounter for screening for malignant neoplasm of colon: Secondary | ICD-10-CM | POA: Diagnosis not present

## 2015-03-02 NOTE — Progress Notes (Signed)
HPI: This is a   very pleasant 60 year old man    who was referred to me by [name], MD  to evaluate  colon cancer screening .    Chief complaint is routine risk for colon cancer, chronic anticoagulation  Never had colon cancer screening.  No GI symptoms  No FH of colon cancer.  He had a stroke 4-5 years and has been on coumadin since then. 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  His primary care physician manages his blood thinner.    Review of systems: Pertinent positive and negative review of systems were noted in the above HPI section. Complete review of systems was performed and was otherwise normal.   Past Medical History  Diagnosis Date  . Diabetes mellitus   . Hypertension   . Atrial fibrillation ([place])   . Cardiomyopathy   . Cerebrovascular accident ([place])   . Hyperlipidemia   . Seizures [facility])     Past Surgical History  Procedure Laterality Date  . Ear surgery as a child      Current Outpatient Prescriptions  Medication Sig Dispense Refill  . amLODipine (NORVASC) 10 MG tablet Take 1 tablet (10 mg total) by mouth daily. 30 tablet 11  . aspirin 81 MG tablet Take 81 mg by mouth daily.    Marland Kitchen atorvastatin (LIPITOR) 20 MG tablet Take 1 tablet (20 mg total) by mouth daily. 90 tablet 3  . carvedilol (COREG) 25 MG tablet Take 1 tablet (25 mg total) by mouth 2 (two) times daily with a meal. 60 tablet 11  . fenofibrate 160 MG tablet Take 1 tablet (160 mg total) by mouth daily. 30 tablet 5  . glucose blood (ONE TOUCH ULTRA TEST) test strip Pt checks BS qid Dx 250.00 200 each 5  . hydrALAZINE (APRESOLINE) 50 MG tablet TAKE 1 TABLET BY MOUTH THREE TIMES DAILY 270 tablet 2  . insulin detemir (LEVEMIR) 100 UNIT/ML injection 70 units in the AM, 40 units at bedtime 30 mL 11  . Insulin Syringe-Needle U-100 (INSULIN SYRINGE .3CC/31GX5/16") 31G X 5/16" 0.3 ML MISC Use twice daily to inject insulin. 100 each 10  . warfarin (COUMADIN) 10 MG tablet TAKE 1  TABLET(10 MG) BY MOUTH DAILY 30 tablet 0  . warfarin (COUMADIN) 10 MG tablet TAKE 1 TABLET(10 MG) BY MOUTH DAILY 30 tablet 0   No current facility-administered medications for this visit.    Allergies as of 03/02/2015 - Review Complete 03/02/2015  Allergen Reaction Noted  . Codeine Other (See Comments)     Family History  Problem Relation Age of Onset  . Hyperlipidemia Father   . Hypertension Father   . Coronary artery disease Neg Hx     no early CAD.    Social History   Social History  . Marital Status: Divorced    Spouse Name: N/A  . Number of Children: 2  . Years of Education: N/A   Occupational History  . Disabled    Social History Main Topics  . Smoking status: Never Smoker   . Smokeless tobacco: Never Used  . Alcohol Use: No  . Drug Use: Not on file  . Sexual Activity: Not on file   Other Topics Concern  . Not on file   Social History Narrative   He has children.     Physical Exam: BP 170/100 mmHg  Pulse 88  Ht '5\' 9"'$  (1.753 m)  Wt 214 lb 6.4 oz (97.251 kg)  BMI 31.65 kg/m2 Constitutional:  generally well-appearing Psychiatric: alert and oriented x3 Eyes: extraocular movements intact Mouth: oral pharynx moist, no lesions Neck: supple no lymphadenopathy Cardiovascular: heart regular rate and rhythm Lungs: clear to auscultation bilaterally Abdomen: soft, nontender, nondistended, no obvious ascites, no peritoneal signs, normal bowel sounds Extremities: no lower extremity edema bilaterally Skin: no lesions on visible extremities   Assessment and plan: 60 y.o. male with  routine risk for colon cancer, chronic blood thinner use for atrial fibrillation and status post stroke  He understands that colonoscopy while on blood thinner will increase the risk for procedural related complication of bleeding. Generally the risk of holding Coumadin for atrial fibrillation is quite low however he has had a stroke in the past and so I think his risk is probably a  bit higher than usual. We will communicate with his primary care physician who manages his blood thinner about the safety of holding the Coumadin for 5 days prior to colonoscopy as is the usual protocol versus holding the Coumadin and starting a bridge with Lovenox-type medicine. He wants to wait until he hears from Dr. Dennard Schaumann before committing to the colonoscopy which I understand.   Owens Loffler, MD [facility] 03/02/2015, 11:21 AM  Cc: [name], MD

## 2015-03-02 NOTE — Patient Instructions (Addendum)
You will be set up for a colonoscopy for colon cancer screening. We will communicate with Dr. Dennard Schaumann about the safety of holding your coumadin for 5 days prior to the colonoscopy (?bridge with lovenox or other since you have had a stroke in the past).

## 2015-03-08 ENCOUNTER — Encounter: Payer: Self-pay | Admitting: Family Medicine

## 2015-03-08 ENCOUNTER — Encounter: Payer: Self-pay | Admitting: *Deleted

## 2015-03-08 ENCOUNTER — Telehealth: Payer: Self-pay

## 2015-03-08 DIAGNOSIS — E113599 Type 2 diabetes mellitus with proliferative diabetic retinopathy without macular edema, unspecified eye: Secondary | ICD-10-CM | POA: Insufficient documentation

## 2015-03-08 NOTE — Telephone Encounter (Signed)
communicate with Dr. Dennard Schaumann about the safety of holding your coumadin for 5 days prior to the colonoscopy (?bridge with lovenox or other since you have had a stroke in the past).

## 2015-03-10 NOTE — Telephone Encounter (Signed)
Letter faxed to Dr Dennard Schaumann to inquire about coumadin

## 2015-03-15 NOTE — Telephone Encounter (Signed)
I would suggest him stopping coumadin at least 5 days prior to procedure. I can cover him with lovenox prior to procedure and stop that 24 hours prior to procedure and resume coumadin after procedure. I can have my nurse contact him with this.    Thanks.    Tom    ----- Message -----     From: Barron Alvine, CMA     Sent: 03/15/2015  2:19 PM      To: [name], MD        Please respond ASAP

## 2015-03-15 NOTE — Telephone Encounter (Signed)
Letter resent to Dr Dennard Schaumann

## 2015-03-16 ENCOUNTER — Telehealth: Payer: Self-pay | Admitting: Family Medicine

## 2015-03-16 NOTE — Telephone Encounter (Signed)
Called and left message with pt's mother to have pt return my call.

## 2015-03-16 NOTE — Telephone Encounter (Signed)
Pt has been scheduled for previsit and colon.  Dr Dennard Schaumann response is below regarding blood thinner.

## 2015-03-16 NOTE — Telephone Encounter (Signed)
Pt called back and informed of below and when he gets his colonoscopy scheduled he will call and let me know so that we can cover him with the Lovenox and show him how to give it to himself.

## 2015-03-16 NOTE — Telephone Encounter (Signed)
-----   Message from [name], MD sent at 03/15/2015  4:15 PM EST ----- Have him hold coumadin at least 5 days before colonoscopy.  Cover him with lovenox 120 mg injections daily while off coumadin and then no lovenox 24 hours prior to colonoscopy.  Resume coumadin after colonoscopy. Please call patient and tell him this

## 2015-03-22 ENCOUNTER — Ambulatory Visit (INDEPENDENT_AMBULATORY_CARE_PROVIDER_SITE_OTHER): Payer: Medicare Other | Admitting: Family Medicine

## 2015-03-22 ENCOUNTER — Encounter: Payer: Self-pay | Admitting: Family Medicine

## 2015-03-22 VITALS — BP 128/78 | HR 76 | Temp 98.0°F | Resp 18 | Wt 209.0 lb

## 2015-03-22 DIAGNOSIS — Z7901 Long term (current) use of anticoagulants: Secondary | ICD-10-CM | POA: Diagnosis not present

## 2015-03-22 LAB — PT WITH INR/FINGERSTICK
INR FINGERSTICK: 2.5 — AB (ref 0.80–1.20)
PT FINGERSTICK: 30.4 s — AB (ref 10.4–12.5)

## 2015-03-22 NOTE — Progress Notes (Signed)
Subjective:    Patient ID: Brian [name], male    DOB: 04/26/1955, 60 y.o.   MRN: [id number]  HPI 06/15/14 Patient has atrial fibrillation. Today he is in normal sinus rhythm. His INR today is supratherapeutic at 3.4. He is currently taking 15 mg on Sundays and Wednesdays and 10 mg on all other days. At that time, my plan was: Decrease Coumadin dose to 10 mg every day except Sunday. On Sunday take 15 mg. Recheck INR in 4 weeks  07/16/14 Patient is here today for follow-up. His INR is still elevated at 3.1. He is currently taking 10 mg of Coumadin every day except Sunday. On Sunday he takes 15 mg. He has not missed any doses of his Coumadin.  Patient's blood pressure is very good.  Fasting blood sugars are under 130. He is not checking 2 hour postprandial sugars regularly. He is due for hemoglobin A1c and fasting lipid panel in June.  At that time, my plan was:  decrease Coumadin to 10 mg every day and recheck INR in one month. I have asked the patient to check fasting blood sugars and two-hour postprandial sugars and bring the values to me in one week. If he is having postprandial hyperglycemia, I would start the patient on Januvia. Recheck a hemoglobin A1c and fasting lipid panel at his next office visit in one month.  08/24/14 He is here today for recheck.  Patient is overdue for colonoscopy as well as a PSA. I will schedule these today. Patient's immunizations are up-to-date. Fasting blood sugars range 1:30 to 170. Two-hour postprandial sugars range 200-250. He is currently on 70 units of Levemir in the morning and 40 units of Levemir in the evening. His INR today is therapeutic at 2.7. Otherwise he is doing well without any specific complaints. He denies any chest pain shortness of breath or dyspnea on exertion. He denies any myalgias or right upper quadrant pain. He has been taking Crestor to try to reduce his cholesterol. He is also trying to watch his diet and lose weight.  At that time, my  plan was: Patient's INR is therapeutic today. We will make no changes in his Coumadin dose. His blood pressure is well controlled today.  I will increase his Levemir to 60 units twice daily. Recheck fasting blood sugars and two-hour postprandial sugars in one week. Recheck Coumadin in one month. I will also check a PSA to screen for prostate cancer and will refer the patient to a gastroenterologist to screen for colon cancer.  10/05/14 Patient's hemoglobin A1c was relatively good at 7.2. His creatinine was stable at 1.3. However his cholesterol is significant for an LDL cholesterol greater than 100, triglycerides greater than 250, and HDL cholesterol less than 40. I suggested Lipitor and has yet to start the medication because he is afraid of it. He is here today to check an INR. His INR is therapeutic at 2.7.  At that time, my plan was:  INR is therapeutic. Make no change in his Coumadin and recheck INR in 6 weeks. I convinced the patient to try Lipitor 20 mg by mouth daily in addition to his fenofibrate. His goal LDL cholesterol is less than 100. His goal HDL cholesterol greater than 40. I encouraged the patient try to begin exercising aerobically ultimately for 30 minutes a day 5 days a week. I would like to see his triglycerides closer to 250. Recheck a fasting lipid panel in 3 months  11/16/14 INR today is 3.3  and supratherapeutic. He is taking 10 mg of Coumadin every day. He is also requesting samples of Viagra.  AT that time, my plan was: hold Coumadin for 24 hours. Then resume present dose of Coumadin. Recheck in 4 weeks. Slightly increase the vitamin K in his diet. I did give the patient samples of 100 mg Viagra tablets with explanation on how to take it.  12/28/14 He is here to recheck his INR.  His INR is supratherapeutic at 3.1. He continues to take 10 mg of Coumadin every day. His blood pressure today is excellent. He is only been on Lipitor for one month and therefore he is not due yet to  recheck a fasting lipid panel. I scheduled patient for colonoscopy back in July. However apparently that referral was canceled. He still needs a colonoscopy.  At that time, my plan was: Decrease Coumadin to 10 mg every day and 5 mg only on Wednesday. I will schedule the patient again for colonoscopy. I would like him to come in fasting in January so that we can check a fasting lipid panel along with a CMP and hemoglobin A1c. I will refill his insulin at the present time. His blood pressures acceptable.  02/08/15 Patient's INR today is subtherapeutic at 1.0.  He admits that he quit coumadin 3 weeks ago because he felt cold.  I explained that coumadin does not cause that and the necessity to take coumadin to prevent a stroke due to his paroxysmal a fib.  He is overdue for fasting lab work to monitor his diabetes and his cholesterol.  He reports fbs 120-140 and 2 hour post prandial sugars less than 160.  He denies hypoglycemia.  He denies cp, sob, doe.  Overdue for eye exam.  Colonoscopy scheduled for January.  At that time, my plan was: Resume coumadin 10 mg poqday and recheck in 4 weeks.  Check Hga1c, cmp, flp, and urine microalbumin.  Schedule diabetic eye exam.  Colonoscopy pending.  Flu shot and pneumovax/prevnar are up to date.    03/22/15  He resumed his Coumadin immediately after I saw him last time. He is here today to recheck his INR. He denies any complaints at this time  Past Medical History  Diagnosis Date  . Diabetes mellitus   . Hypertension   . Atrial fibrillation ([place])   . Cardiomyopathy   . Cerebrovascular accident ([place])   . Hyperlipidemia   . Seizures ([place])   . 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 Zigmund Daniel, laser therapy OD   Past Surgical History  Procedure Laterality Date  . Ear surgery as a child     Current Outpatient Prescriptions on File Prior to Visit  Medication Sig Dispense Refill  . amLODipine (NORVASC) 10 MG tablet Take 1 tablet (10 mg total) by  mouth daily. 30 tablet 11  . aspirin 81 MG tablet Take 81 mg by mouth daily.    Marland Kitchen atorvastatin (LIPITOR) 20 MG tablet Take 1 tablet (20 mg total) by mouth daily. 90 tablet 3  . carvedilol (COREG) 25 MG tablet Take 1 tablet (25 mg total) by mouth 2 (two) times daily with a meal. 60 tablet 11  . glucose blood (ONE TOUCH ULTRA TEST) test strip Pt checks BS qid Dx 250.00 200 each 5  . hydrALAZINE (APRESOLINE) 50 MG tablet TAKE 1 TABLET BY MOUTH THREE TIMES DAILY 270 tablet 2  . insulin detemir (LEVEMIR) 100 UNIT/ML injection 70 units in the AM, 40 units at bedtime  30 mL 11  . Insulin Syringe-Needle U-100 (INSULIN SYRINGE .3CC/31GX5/16") 31G X 5/16" 0.3 ML MISC Use twice daily to inject insulin. 100 each 10  . warfarin (COUMADIN) 10 MG tablet TAKE 1 TABLET(10 MG) BY MOUTH DAILY 30 tablet 0  . fenofibrate 160 MG tablet Take 1 tablet (160 mg total) by mouth daily. (Patient not taking: Reported on 03/22/2015) 30 tablet 5   No current facility-administered medications on file prior to visit.   Allergies  Allergen Reactions  . Codeine Other (See Comments)    Unknown reaction   Social History   Social History  . Marital Status: Divorced    Spouse Name: N/A  . Number of Children: 2  . Years of Education: N/A   Occupational History  . Disabled    Social History Main Topics  . Smoking status: Never Smoker   . Smokeless tobacco: Never Used  . Alcohol Use: No  . Drug Use: Not on file  . Sexual Activity: Not on file   Other Topics Concern  . Not on file   Social History Narrative   He has children.      Review of Systems  All other systems reviewed and are negative.      Objective:   Physical Exam  Cardiovascular: Normal rate, regular rhythm and normal heart sounds.   Pulmonary/Chest: Effort normal and breath sounds normal. No respiratory distress. He has no wheezes. He has no rales. He exhibits no tenderness.  Abdominal: Soft. Bowel sounds are normal. He exhibits no distension.  There is no tenderness. There is no rebound.  Musculoskeletal: He exhibits no edema.  Vitals reviewed.         Assessment & Plan:  Long term current use of anticoagulant therapy - Plan: PT with INR/Fingerstick   INR 2.5 is therapeutic.  Make no changes in coumadin at this time and recheck in 6 weeks.

## 2015-03-23 ENCOUNTER — Other Ambulatory Visit (INDEPENDENT_AMBULATORY_CARE_PROVIDER_SITE_OTHER): Payer: Medicare Other | Admitting: Ophthalmology

## 2015-03-23 DIAGNOSIS — E11311 Type 2 diabetes mellitus with unspecified diabetic retinopathy with macular edema: Secondary | ICD-10-CM | POA: Diagnosis not present

## 2015-03-23 DIAGNOSIS — E113591 Type 2 diabetes mellitus with proliferative diabetic retinopathy without macular edema, right eye: Secondary | ICD-10-CM | POA: Diagnosis not present

## 2015-04-21 ENCOUNTER — Ambulatory Visit (AMBULATORY_SURGERY_CENTER): Payer: Self-pay | Admitting: *Deleted

## 2015-04-21 ENCOUNTER — Telehealth: Payer: Self-pay | Admitting: Family Medicine

## 2015-04-21 VITALS — Ht 70.0 in | Wt 212.0 lb

## 2015-04-21 DIAGNOSIS — Z1211 Encounter for screening for malignant neoplasm of colon: Secondary | ICD-10-CM

## 2015-04-21 MED ORDER — NA SULFATE-K SULFATE-MG SULF 17.5-3.13-1.6 GM/177ML PO SOLN: 1.0000 | Freq: Once | ORAL | Status: DC

## 2015-04-21 MED ORDER — ENOXAPARIN SODIUM 120 MG/0.8ML ~~LOC~~ SOLN: 120.0000 mg | SUBCUTANEOUS | Status: DC

## 2015-04-21 NOTE — Telephone Encounter (Signed)
Med sent to pharm - pt aware to stop coumadin 04/22/15 replace with lovenox injection 04/22/15-05/02/15 then nothing on 05/03/15 then colonoscopy on 05/04/15 then can restart coumadin on 05/05/15.

## 2015-04-21 NOTE — Telephone Encounter (Signed)
Tried to call pt and no answer and mailbox is full unable to leave message

## 2015-04-21 NOTE — Telephone Encounter (Signed)
Patient is calling to speak to you about his upcoming colonoscopy and medication for this [phone]

## 2015-04-21 NOTE — Progress Notes (Signed)
No egg or soy allergy. No anesthesia problems.  No home O2.  No diet meds.  No emmi given, pt refused.

## 2015-04-22 ENCOUNTER — Ambulatory Visit: Payer: Medicare Other | Admitting: Family Medicine

## 2015-04-22 DIAGNOSIS — Z7901 Long term (current) use of anticoagulants: Secondary | ICD-10-CM

## 2015-04-22 NOTE — Progress Notes (Signed)
Patient ID: Brian [name], male   DOB: March 24, 1955, 59 y.o.   MRN: [id number] Pt came today with Lovenox injections ordered by GI.  Pt having Colonoscopy next week and needs to stop Oral Coumadin.  Has four syringes to be given today and the next three days.  Pt shown how to open and prepare syringe.  Shown anatomical location to give injections.  Explained how to prep site and give injection.  Pt was then handed first syringe.  Pt properly prepared syringe and prepped site on abdomin for injection.  He then gave himself the first injection without any difficulty.  He acknowledged understanding of the process and feels confident he can do this for the next few days.

## 2015-04-27 ENCOUNTER — Encounter: Payer: Self-pay | Admitting: Gastroenterology

## 2015-04-27 ENCOUNTER — Ambulatory Visit (AMBULATORY_SURGERY_CENTER): Payer: Medicare Other | Admitting: Gastroenterology

## 2015-04-27 VITALS — BP 141/79 | HR 70 | Temp 98.6°F | Resp 19 | Ht 70.0 in | Wt 212.0 lb

## 2015-04-27 DIAGNOSIS — D125 Benign neoplasm of sigmoid colon: Secondary | ICD-10-CM | POA: Diagnosis not present

## 2015-04-27 DIAGNOSIS — D122 Benign neoplasm of ascending colon: Secondary | ICD-10-CM

## 2015-04-27 DIAGNOSIS — Z1211 Encounter for screening for malignant neoplasm of colon: Secondary | ICD-10-CM

## 2015-04-27 DIAGNOSIS — D124 Benign neoplasm of descending colon: Secondary | ICD-10-CM

## 2015-04-27 LAB — GLUCOSE, CAPILLARY
Glucose-Capillary: 229 mg/dL — ABNORMAL HIGH (ref 65–99)
Glucose-Capillary: 200 mg/dL — ABNORMAL HIGH (ref 65–99)

## 2015-04-27 MED ORDER — SODIUM CHLORIDE 0.9 % IV SOLN: 500.0000 mL | INTRAVENOUS | Status: DC

## 2015-04-27 NOTE — Progress Notes (Signed)
Called to room to assist during endoscopic procedure.  Patient ID and intended procedure confirmed with present staff. Received instructions for my participation in the procedure from the performing physician.  

## 2015-04-27 NOTE — Op Note (Signed)
Patient Name: Brian [name] Procedure Date: 04/27/2015 10:44 AM MRN: [id number] Endoscopist: Milus Banister , MD Age: 60 Referring MD:  Date of Birth: 05/25/1955 Gender: Male Procedure:            Colonoscopy Indications:          Screening for colorectal malignant neoplasm Medicines:            Monitored Anesthesia Care Procedure:            Pre-Anesthesia Assessment:                       - Prior to the procedure, a History and Physical was                        performed, and patient medications and allergies were                        reviewed. The patient's tolerance of previous                        anesthesia was also reviewed. The risks and benefits of                        the procedure and the sedation options and risks were                        discussed with the patient. All questions were                        answered, and informed consent was obtained. Prior                        Anticoagulants: The patient has taken Coumadin                        (warfarin), last dose was 5 days prior to procedure.                        ASA Grade Assessment: III - A patient with severe                        systemic disease. After reviewing the risks and                        benefits, the patient was deemed in satisfactory                        condition to undergo the procedure.                       After obtaining informed consent, the colonoscope was                        passed under direct vision. Throughout the procedure,                        the patient's blood pressure, pulse, and oxygen                        saturations were monitored continuously. The Model  CF-[id number] ([id number]) scope was introduced through the                        anus and advanced to the the cecum, identified by                        appendiceal orifice and ileocecal valve. The quality of                        the bowel preparation was good. The ileocecal valve,                         appendiceal orifice, and rectum were photographed. Scope In: 10:55:25 AM Scope Out: 11:05:22 AM Scope Withdrawal Time: 0 hours 7 minutes 41 seconds  Total Procedure Duration: 0 hours 9 minutes 57 seconds  Findings:      A 15 mm polyp was found in the mid ascending colon. The polyp was       semi-pedunculated. The polyp was removed with a hot snare. Resection and       retrieval were complete.      A 6 mm polyp was found in the sigmoid colon. The polyp was sessile. The       polyp was removed with a cold snare. Resection and retrieval were       complete.      The exam was otherwise without abnormality. Complications:        No immediate complications. Estimated Blood Loss: Estimated blood loss: none. Impression:           - One 15 mm polyp in the mid ascending colon, removed                        with a hot snare. Resected and retrieved.                       - One 6 mm polyp in the sigmoid colon, removed with a                        cold snare. Resected and retrieved.                       - The examination was otherwise normal. Recommendation:       - Patient has a contact number available for                        emergencies. The signs and symptoms of potential                        delayed complications were discussed with the patient.                        Return to normal activities tomorrow. Written discharge                        instructions were provided to the patient.                       - Resume previous diet.                       -  Resume Coumadin (warfarin) at prior dose. Refer to                        Coumadin Clinic for further adjustment of therapy.                       - If the polyp(s) is proven to be 'pre-cancerous' on                        pathology, you will likely need repeat colonoscopy in                        3-5 years. You will not need colon cancer screening by                        any method (including stool testing)  prior to then.                       - Repeat colonoscopy is recommended. The colonoscopy                        date will be determined after pathology results from                        today's exam become available for review. Procedure Code(s):    --- Professional ---                       [phone], Colonoscopy, flexible; with removal of tumor(s),                        polyp(s), or other lesion(s) by snare technique CPT copyright 2016 [employer]. All rights reserved. Milus Banister, MD Milus Banister, MD 04/27/2015 11:13:34 AM Number of Addenda: 0

## 2015-04-27 NOTE — Patient Instructions (Signed)
YOU HAD AN ENDOSCOPIC PROCEDURE TODAY AT [facility]:   Refer to the procedure report that was given to you for any specific questions about what was found during the examination.  If the procedure report does not answer your questions, please call your gastroenterologist to clarify.  If you requested that your care partner not be given the details of your procedure findings, then the procedure report has been included in a sealed envelope for you to review at your convenience later.  YOU SHOULD EXPECT: Some feelings of bloating in the abdomen. Passage of more gas than usual.  Walking can help get rid of the air that was put into your GI tract during the procedure and reduce the bloating. If you had a lower endoscopy (such as a colonoscopy or flexible sigmoidoscopy) you may notice spotting of blood in your stool or on the toilet paper. If you underwent a bowel prep for your procedure, you may not have a normal bowel movement for a few days.  Please Note:  You might notice some irritation and congestion in your nose or some drainage.  This is from the oxygen used during your procedure.  There is no need for concern and it should clear up in a day or so.  SYMPTOMS TO REPORT IMMEDIATELY:   Following lower endoscopy (colonoscopy or flexible sigmoidoscopy):  Excessive amounts of blood in the stool  Significant tenderness or worsening of abdominal pains  Swelling of the abdomen that is new, acute  Fever of 100F or higher   For urgent or emergent issues, a gastroenterologist can be reached at any hour by calling [phone].   DIET: Your first meal following the procedure should be a small meal and then it is ok to progress to your normal diet. Heavy or fried foods are harder to digest and may make you feel nauseous or bloated.  Likewise, meals heavy in dairy and vegetables can increase bloating.  Drink plenty of fluids but you should avoid alcoholic beverages for 24  hours.  ACTIVITY:  You should plan to take it easy for the rest of today and you should NOT DRIVE or use heavy machinery until tomorrow (because of the sedation medicines used during the test).    FOLLOW UP: Our staff will call the number listed on your records the next business day following your procedure to check on you and address any questions or concerns that you may have regarding the information given to you following your procedure. If we do not reach you, we will leave a message.  However, if you are feeling well and you are not experiencing any problems, there is no need to return our call.  We will assume that you have returned to your regular daily activities without incident.  If any biopsies were taken you will be contacted by phone or by letter within the next 1-3 weeks.  Please call us at [phone] if you have not heard about the biopsies in 3 weeks.    SIGNATURES/CONFIDENTIALITY: You and/or your care partner have signed paperwork which will be entered into your electronic medical record.  These signatures attest to the fact that that the information above on your After Visit Summary has been reviewed and is understood.  Full responsibility of the confidentiality of this discharge information lies with you and/or your care-partner.  Polyp handout given Hold NSAIDS for 2 weeks Await pathology results Resume all medications and diet Repeat in 3 years

## 2015-04-27 NOTE — Progress Notes (Signed)
Stable to RR 

## 2015-04-28 ENCOUNTER — Telehealth: Payer: Self-pay | Admitting: *Deleted

## 2015-04-28 NOTE — Telephone Encounter (Signed)
  Follow up Call-  Call back number 04/27/2015  Post procedure Call Back phone  # [phone]  Permission to leave phone message Yes     Patient questions:  Do you have a fever, pain , or abdominal swelling? No. Pain Score  0 *  Have you tolerated food without any problems? Yes.    Have you been able to return to your normal activities? Yes.    Do you have any questions about your discharge instructions: Diet   No. Medications  No. Follow up visit  No.  Do you have questions or concerns about your Care? No.  Actions: * If pain score is 4 or above: No action needed, pain <4.

## 2015-05-03 ENCOUNTER — Ambulatory Visit (INDEPENDENT_AMBULATORY_CARE_PROVIDER_SITE_OTHER): Payer: Medicare Other | Admitting: Family Medicine

## 2015-05-03 ENCOUNTER — Encounter: Payer: Self-pay | Admitting: Family Medicine

## 2015-05-03 DIAGNOSIS — I4891 Unspecified atrial fibrillation: Secondary | ICD-10-CM

## 2015-05-03 LAB — PT WITH INR/FINGERSTICK
INR FINGERSTICK: 1.7 — AB (ref 0.80–1.20)
PT, fingerstick: 20.6 seconds — ABNORMAL HIGH (ref 10.4–12.5)

## 2015-05-03 NOTE — Progress Notes (Signed)
Subjective:    Patient ID: Brian [name], male    DOB: 1955/09/22, 60 y.o.   MRN: [id number]  HPI  02/08/15 Patient's INR today is subtherapeutic at 1.0.  He admits that he quit coumadin 3 weeks ago because he felt cold.  I explained that coumadin does not cause that and the necessity to take coumadin to prevent a stroke due to his paroxysmal a fib.  He is overdue for fasting lab work to monitor his diabetes and his cholesterol.  He reports fbs 120-140 and 2 hour post prandial sugars less than 160.  He denies hypoglycemia.  He denies cp, sob, doe.  Overdue for eye exam.  Colonoscopy scheduled for January.  At that time, my plan was: Resume coumadin 10 mg poqday and recheck in 4 weeks.  Check Hga1c, cmp, flp, and urine microalbumin.  Schedule diabetic eye exam.  Colonoscopy pending.  Flu shot and pneumovax/prevnar are up to date.    03/22/15 He resumed his Coumadin immediately after I saw him last time. He is here today to recheck his INR. He denies any complaints at this time.  At that time, my plan was: INR 2.5 is therapeutic.  Make no changes in coumadin at this time and recheck in 6 weeks.   05/03/15 Last HgA1c was 8.3 in December. Recently had a colonoscopy on 04/27/15.  They did find polyps. However the gastroenterologist believe they were benign. His INR today is subtherapeutic at 1.7 but he is only been back on his Coumadin for 5 days. However it should continue to rise over the next 2-3 days. He is currently on Levemir 70 units in the morning 40 units in the evening. His fasting blood sugars are all less than 130 however he is not checking his two-hour postprandial sugars. He is not quite due for hemoglobin A1c. He will be due for his next office visit  Past Medical History  Diagnosis Date  . Diabetes mellitus   . Hypertension   . Atrial fibrillation ([place])   . Cardiomyopathy   . Hyperlipidemia   . 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 Zigmund Daniel, laser therapy OD  .  Arthritis   . Cerebrovascular accident ([place])     2012  . Left-sided weakness   . Seizures ([place])     pt reports this is from low blood sugar   Past Surgical History  Procedure Laterality Date  . Ear surgery as a child    . Tonsillectomy     Current Outpatient Prescriptions on File Prior to Visit  Medication Sig Dispense Refill  . amLODipine (NORVASC) 10 MG tablet Take 1 tablet (10 mg total) by mouth daily. 30 tablet 11  . atorvastatin (LIPITOR) 20 MG tablet Take 1 tablet (20 mg total) by mouth daily. 90 tablet 3  . carvedilol (COREG) 25 MG tablet Take 1 tablet (25 mg total) by mouth 2 (two) times daily with a meal. 60 tablet 11  . DUREZOL 0.05 % EMUL INT 1 GTT IN OD BID FOR 2 WKS  2  . enoxaparin (LOVENOX) 120 MG/0.8ML injection Inject 0.8 mLs (120 mg total) into the skin daily. (Fir, Sat, Sun, Mon) 4 Syringe 0  . glucose blood (ONE TOUCH ULTRA TEST) test strip Pt checks BS qid Dx 250.00 200 each 5  . hydrALAZINE (APRESOLINE) 50 MG tablet TAKE 1 TABLET BY MOUTH THREE TIMES DAILY 270 tablet 2  . insulin detemir (LEVEMIR) 100 UNIT/ML injection 70 units in the AM,  40 units at bedtime 30 mL 11  . Insulin Syringe-Needle U-100 (INSULIN SYRINGE .3CC/31GX5/16") 31G X 5/16" 0.3 ML MISC Use twice daily to inject insulin. 100 each 10  . warfarin (COUMADIN) 10 MG tablet TAKE 1 TABLET(10 MG) BY MOUTH DAILY 30 tablet 0   No current facility-administered medications on file prior to visit.   Allergies  Allergen Reactions  . Codeine Other (See Comments)    Unknown reaction, severe headach   Social History   Social History  . Marital Status: Divorced    Spouse Name: N/A  . Number of Children: 2  . Years of Education: N/A   Occupational History  . Disabled    Social History Main Topics  . Smoking status: Never Smoker   . Smokeless tobacco: Never Used  . Alcohol Use: 0.0 oz/week    0 Standard drinks or equivalent per week     Comment: seldom  . Drug Use: 7.00 per week     Comment:  marijauna for arthritis  . Sexual Activity: Not on file   Other Topics Concern  . Not on file   Social History Narrative   He has children.      Review of Systems  All other systems reviewed and are negative.      Objective:   Physical Exam  Cardiovascular: Normal rate, regular rhythm and normal heart sounds.   Pulmonary/Chest: Effort normal and breath sounds normal. No respiratory distress. He has no wheezes. He has no rales. He exhibits no tenderness.  Abdominal: Soft. Bowel sounds are normal. He exhibits no distension. There is no tenderness. There is no rebound.  Musculoskeletal: He exhibits no edema.  Vitals reviewed.         Assessment & Plan:  Atrial fibrillation, unspecified - Plan: PT with INR/Fingerstick  I do not believe that the patient has reached the full therapeutic effect of the Coumadin yet. Therefore I recommended no changes and recheck his Coumadin in 4 weeks. I believe that his INR will continue to rise over the next 48 hours. His fasting blood sugars sound excellent but I'm concerned that his two-hour postprandial sugars may be high. Therefore I've asked the patient to check his two-hour postprandial sugars and notify me in one week. He is very hesitant to use rapid insulin with meals. Therefore his option would be Actos, Januvia, or trulicity.  Due to ease of use Januvia would be his best option  Also because of his congestive heart failure.

## 2015-05-06 ENCOUNTER — Encounter: Payer: Self-pay | Admitting: Gastroenterology

## 2015-05-27 ENCOUNTER — Other Ambulatory Visit: Payer: Self-pay | Admitting: Family Medicine

## 2015-05-27 ENCOUNTER — Telehealth: Payer: Self-pay | Admitting: Family Medicine

## 2015-05-27 MED ORDER — GLUCOSE BLOOD VI STRP: ORAL_STRIP | Status: DC

## 2015-05-27 NOTE — Telephone Encounter (Signed)
Strips sent to pharm

## 2015-05-27 NOTE — Telephone Encounter (Signed)
Test strips refilled 90 day + refills

## 2015-05-27 NOTE — Telephone Encounter (Signed)
[facility]  Needs rx for 1 touch ultra strips to go to this pharmacy if possible  [phone]

## 2015-06-06 ENCOUNTER — Encounter: Payer: Self-pay | Admitting: Family Medicine

## 2015-06-06 ENCOUNTER — Ambulatory Visit (INDEPENDENT_AMBULATORY_CARE_PROVIDER_SITE_OTHER): Payer: Medicare Other | Admitting: Family Medicine

## 2015-06-06 VITALS — BP 128/78 | HR 72 | Temp 97.5°F | Resp 18 | Ht 69.0 in | Wt 216.0 lb

## 2015-06-06 DIAGNOSIS — E1165 Type 2 diabetes mellitus with hyperglycemia: Secondary | ICD-10-CM

## 2015-06-06 DIAGNOSIS — Z794 Long term (current) use of insulin: Secondary | ICD-10-CM

## 2015-06-06 DIAGNOSIS — I4891 Unspecified atrial fibrillation: Secondary | ICD-10-CM | POA: Diagnosis not present

## 2015-06-06 LAB — PROTIME-INR
INR: 1.96 — AB (ref <1.50)
PROTHROMBIN TIME: 22.6 s — AB (ref 11.6–15.2)

## 2015-06-06 LAB — HEMOGLOBIN A1C
Hgb A1c MFr Bld: 8.1 % — ABNORMAL HIGH (ref <5.7)
MEAN PLASMA GLUCOSE: 186 mg/dL

## 2015-06-06 NOTE — Progress Notes (Signed)
Subjective:    Patient ID: Brian [name], male    DOB: February 17, 1956, 60 y.o.   MRN: [id number]  HPI  02/08/15 Patient's INR today is subtherapeutic at 1.0.  He admits that he quit coumadin 3 weeks ago because he felt cold.  I explained that coumadin does not cause that and the necessity to take coumadin to prevent a stroke due to his paroxysmal a fib.  He is overdue for fasting lab work to monitor his diabetes and his cholesterol.  He reports fbs 120-140 and 2 hour post prandial sugars less than 160.  He denies hypoglycemia.  He denies cp, sob, doe.  Overdue for eye exam.  Colonoscopy scheduled for January.  At that time, my plan was: Resume coumadin 10 mg poqday and recheck in 4 weeks.  Check Hga1c, cmp, flp, and urine microalbumin.  Schedule diabetic eye exam.  Colonoscopy pending.  Flu shot and pneumovax/prevnar are up to date.    03/22/15 He resumed his Coumadin immediately after I saw him last time. He is here today to recheck his INR. He denies any complaints at this time.  At that time, my plan was: INR 2.5 is therapeutic.  Make no changes in coumadin at this time and recheck in 6 weeks.   05/03/15 Last HgA1c was 8.3 in December. Recently had a colonoscopy on 04/27/15.  They did find polyps. However the gastroenterologist believe they were benign. His INR today is subtherapeutic at 1.7 but he is only been back on his Coumadin for 5 days. However it should continue to rise over the next 2-3 days. He is currently on Levemir 70 units in the morning 40 units in the evening. His fasting blood sugars are all less than 130 however he is not checking his two-hour postprandial sugars. He is not quite due for hemoglobin A1c. He will be due for his next office visit.  AT that time, my plan was: I do not believe that the patient has reached the full therapeutic effect of the Coumadin yet. Therefore I recommended no changes and recheck his Coumadin in 4 weeks. I believe that his INR will continue to rise  over the next 48 hours. His fasting blood sugars sound excellent but I'm concerned that his two-hour postprandial sugars may be high. Therefore I've asked the patient to check his two-hour postprandial sugars and notify me in one week. He is very hesitant to use rapid insulin with meals. Therefore his option would be Actos, Januvia, or trulicity.  Due to ease of use Januvia would be his best option  Also because of his congestive heart failure.  06/06/15 Currently on Levemir 70 units in the morning and 40-50 units in the evening. Fasting blood sugar in the morning is between 160 and 200. Two-hour postprandial sugars in the evening is usually between 150 and 190. There are no episodes of hypoglycemia. He refuses quick acting insulin. He also does not want to take another medication because his next pill option would be name brand medication that would be too expensive. Here today also for an INR check  Past Medical History  Diagnosis Date  . Diabetes mellitus   . Hypertension   . Atrial fibrillation ([place])   . Cardiomyopathy   . Hyperlipidemia   . 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 Zigmund Daniel, laser therapy OD  . Arthritis   . Cerebrovascular accident ([place])     2012  . Left-sided weakness   . Seizures ([place])  pt reports this is from low blood sugar   Past Surgical History  Procedure Laterality Date  . Ear surgery as a child    . Tonsillectomy     Current Outpatient Prescriptions on File Prior to Visit  Medication Sig Dispense Refill  . amLODipine (NORVASC) 10 MG tablet Take 1 tablet (10 mg total) by mouth daily. 30 tablet 11  . atorvastatin (LIPITOR) 20 MG tablet Take 1 tablet (20 mg total) by mouth daily. 90 tablet 3  . carvedilol (COREG) 25 MG tablet Take 1 tablet (25 mg total) by mouth 2 (two) times daily with a meal. 60 tablet 11  . DUREZOL 0.05 % EMUL INT 1 GTT IN OD BID FOR 2 WKS  2  . hydrALAZINE (APRESOLINE) 50 MG tablet TAKE 1 TABLET BY MOUTH THREE TIMES DAILY 270  tablet 2  . insulin detemir (LEVEMIR) 100 UNIT/ML injection 70 units in the AM, 40 units at bedtime 30 mL 11  . Insulin Syringe-Needle U-100 (INSULIN SYRINGE .3CC/31GX5/16") 31G X 5/16" 0.3 ML MISC Use twice daily to inject insulin. 100 each 10  . ONE TOUCH ULTRA TEST test strip CHECK BLOOD SUGAR FOUR TIMES DAILY 400 each 3  . warfarin (COUMADIN) 10 MG tablet TAKE 1 TABLET(10 MG) BY MOUTH DAILY 30 tablet 0   No current facility-administered medications on file prior to visit.   Allergies  Allergen Reactions  . Codeine Other (See Comments)    Unknown reaction, severe headach   Social History   Social History  . Marital Status: Divorced    Spouse Name: N/A  . Number of Children: 2  . Years of Education: N/A   Occupational History  . Disabled    Social History Main Topics  . Smoking status: Never Smoker   . Smokeless tobacco: Never Used  . Alcohol Use: 0.0 oz/week    0 Standard drinks or equivalent per week     Comment: seldom  . Drug Use: 7.00 per week     Comment: marijauna for arthritis  . Sexual Activity: Not on file   Other Topics Concern  . Not on file   Social History Narrative   He has children.      Review of Systems  All other systems reviewed and are negative.      Objective:   Physical Exam  Cardiovascular: Normal rate, regular rhythm and normal heart sounds.   Pulmonary/Chest: Effort normal and breath sounds normal. No respiratory distress. He has no wheezes. He has no rales. He exhibits no tenderness.  Abdominal: Soft. Bowel sounds are normal. He exhibits no distension. There is no tenderness. There is no rebound.  Musculoskeletal: He exhibits no edema.  Vitals reviewed.         Assessment & Plan:  Uncontrolled type 2 diabetes mellitus with hyperglycemia, with long-term current use of insulin (HCC) - Plan: COMPLETE METABOLIC PANEL WITH GFR, Hemoglobin A1c, Lipid panel, Microalbumin, urine  Atrial fibrillation, unspecified - Plan: PT with  INR/Fingerstick  Increase Levemir to 70 units in the morning and 70 units in the evening. Recheck fasting blood sugars and two-hour postprandial sugars in 2 weeks. INR will have to be performed from a venous stick and results will be called to patient.

## 2015-06-07 LAB — COMPLETE METABOLIC PANEL WITH GFR
ALT: 23 U/L (ref 9–46)
AST: 18 U/L (ref 10–35)
Albumin: 4.2 g/dL (ref 3.6–5.1)
Alkaline Phosphatase: 73 U/L (ref 40–115)
BUN: 18 mg/dL (ref 7–25)
CHLORIDE: 103 mmol/L (ref 98–110)
CO2: 20 mmol/L (ref 20–31)
CREATININE: 1.33 mg/dL — AB (ref 0.70–1.25)
Calcium: 9.2 mg/dL (ref 8.6–10.3)
GFR, EST AFRICAN AMERICAN: 66 mL/min (ref >=60)
GFR, Est Non African American: 57 mL/min — ABNORMAL LOW (ref >=60)
GLUCOSE: 213 mg/dL — AB (ref 70–99)
Potassium: 4.1 mmol/L (ref 3.5–5.3)
SODIUM: 140 mmol/L (ref 135–146)
Total Bilirubin: 0.4 mg/dL (ref 0.2–1.2)
Total Protein: 7 g/dL (ref 6.1–8.1)

## 2015-06-07 LAB — LIPID PANEL
CHOL/HDL RATIO: 11.4 ratio — AB (ref <=5.0)
Cholesterol: 206 mg/dL — ABNORMAL HIGH (ref 125–200)
HDL: 18 mg/dL — AB (ref >=40)
Triglycerides: 1239 mg/dL — ABNORMAL HIGH (ref <150)

## 2015-06-07 LAB — MICROALBUMIN, URINE: MICROALB UR: 3.2 mg/dL

## 2015-07-19 ENCOUNTER — Ambulatory Visit (INDEPENDENT_AMBULATORY_CARE_PROVIDER_SITE_OTHER): Payer: Medicare Other | Admitting: Family Medicine

## 2015-07-19 ENCOUNTER — Encounter: Payer: Self-pay | Admitting: Family Medicine

## 2015-07-19 DIAGNOSIS — I4891 Unspecified atrial fibrillation: Secondary | ICD-10-CM | POA: Diagnosis not present

## 2015-07-19 LAB — PT WITH INR/FINGERSTICK
INR FINGERSTICK: 2.7 — AB (ref 0.80–1.20)
PT, fingerstick: 32.4 seconds — ABNORMAL HIGH (ref 10.4–12.5)

## 2015-07-19 NOTE — Progress Notes (Signed)
Subjective:    Patient ID: Brian [name], male    DOB: 1955-03-01, 60 y.o.   MRN: [id number]  HPI  02/08/15 Patient's INR today is subtherapeutic at 1.0.  He admits that he quit coumadin 3 weeks ago because he felt cold.  I explained that coumadin does not cause that and the necessity to take coumadin to prevent a stroke due to his paroxysmal a fib.  He is overdue for fasting lab work to monitor his diabetes and his cholesterol.  He reports fbs 120-140 and 2 hour post prandial sugars less than 160.  He denies hypoglycemia.  He denies cp, sob, doe.  Overdue for eye exam.  Colonoscopy scheduled for January.  At that time, my plan was: Resume coumadin 10 mg poqday and recheck in 4 weeks.  Check Hga1c, cmp, flp, and urine microalbumin.  Schedule diabetic eye exam.  Colonoscopy pending.  Flu shot and pneumovax/prevnar are up to date.    03/22/15 He resumed his Coumadin immediately after I saw him last time. He is here today to recheck his INR. He denies any complaints at this time.  At that time, my plan was: INR 2.5 is therapeutic.  Make no changes in coumadin at this time and recheck in 6 weeks.   05/03/15 Last HgA1c was 8.3 in December. Recently had a colonoscopy on 04/27/15.  They did find polyps. However the gastroenterologist believe they were benign. His INR today is subtherapeutic at 1.7 but he is only been back on his Coumadin for 5 days. However it should continue to rise over the next 2-3 days. He is currently on Levemir 70 units in the morning 40 units in the evening. His fasting blood sugars are all less than 130 however he is not checking his two-hour postprandial sugars. He is not quite due for hemoglobin A1c. He will be due for his next office visit.  AT that time, my plan was: I do not believe that the patient has reached the full therapeutic effect of the Coumadin yet. Therefore I recommended no changes and recheck his Coumadin in 4 weeks. I believe that his INR will continue to rise  over the next 48 hours. His fasting blood sugars sound excellent but I'm concerned that his two-hour postprandial sugars may be high. Therefore I've asked the patient to check his two-hour postprandial sugars and notify me in one week. He is very hesitant to use rapid insulin with meals. Therefore his option would be Actos, Januvia, or trulicity.  Due to ease of use Januvia would be his best option  Also because of his congestive heart failure.  06/06/15 Currently on Levemir 70 units in the morning and 40-50 units in the evening. Fasting blood sugar in the morning is between 160 and 200. Two-hour postprandial sugars in the evening is usually between 150 and 190. There are no episodes of hypoglycemia. He refuses quick acting insulin. He also does not want to take another medication because his next pill option would be name brand medication that would be too expensive. Here today also for an INR check. AT that time, my plan was: Increase Levemir to 70 units in the morning and 70 units in the evening. Recheck fasting blood sugars and two-hour postprandial sugars in 2 weeks. INR will have to be performed from a venous stick and results will be called to patient.    07/19/15 Patient has drastically changed his diet. His triglycerides have risen from the mid 200s to more than 1200.  He is adamant that this was fasting ear however he has changed his diet like to recheck that in August. His hemoglobin A1c was also elevated. He is still on insulin, Levemir, 70 units twice daily. He states his fasting blood sugars are between 90 and 130 and his two-hour postprandial sugars are between 130 and 180. He states that with lifestyle changes his sugars have improved. He is also trying to lose 10 pounds and he wants to recheck his A1c in 3 months. His INR is therapeutic today.  Past Medical History  Diagnosis Date  . Diabetes mellitus   . Hypertension   . Atrial fibrillation ([place])   . Cardiomyopathy   . Hyperlipidemia     . 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 Zigmund Daniel, laser therapy OD  . Arthritis   . Cerebrovascular accident ([place])     2012  . Left-sided weakness   . Seizures ([place])     pt reports this is from low blood sugar   Past Surgical History  Procedure Laterality Date  . Ear surgery as a child    . Tonsillectomy     Current Outpatient Prescriptions on File Prior to Visit  Medication Sig Dispense Refill  . amLODipine (NORVASC) 10 MG tablet Take 1 tablet (10 mg total) by mouth daily. 30 tablet 11  . atorvastatin (LIPITOR) 20 MG tablet Take 1 tablet (20 mg total) by mouth daily. 90 tablet 3  . carvedilol (COREG) 25 MG tablet Take 1 tablet (25 mg total) by mouth 2 (two) times daily with a meal. 60 tablet 11  . DUREZOL 0.05 % EMUL INT 1 GTT IN OD BID FOR 2 WKS  2  . hydrALAZINE (APRESOLINE) 50 MG tablet TAKE 1 TABLET BY MOUTH THREE TIMES DAILY 270 tablet 2  . insulin detemir (LEVEMIR) 100 UNIT/ML injection 70 units in the AM, 40 units at bedtime 30 mL 11  . Insulin Syringe-Needle U-100 (INSULIN SYRINGE .3CC/31GX5/16") 31G X 5/16" 0.3 ML MISC Use twice daily to inject insulin. 100 each 10  . ONE TOUCH ULTRA TEST test strip CHECK BLOOD SUGAR FOUR TIMES DAILY 400 each 3  . warfarin (COUMADIN) 10 MG tablet TAKE 1 TABLET(10 MG) BY MOUTH DAILY 30 tablet 0   No current facility-administered medications on file prior to visit.   Allergies  Allergen Reactions  . Codeine Other (See Comments)    Unknown reaction, severe headach   Social History   Social History  . Marital Status: Divorced    Spouse Name: N/A  . Number of Children: 2  . Years of Education: N/A   Occupational History  . Disabled    Social History Main Topics  . Smoking status: Never Smoker   . Smokeless tobacco: Never Used  . Alcohol Use: 0.0 oz/week    0 Standard drinks or equivalent per week     Comment: seldom  . Drug Use: 7.00 per week     Comment: marijauna for arthritis  . Sexual Activity: Not on file    Other Topics Concern  . Not on file   Social History Narrative   He has children.      Review of Systems  All other systems reviewed and are negative.      Objective:   Physical Exam  Cardiovascular: Normal rate, regular rhythm and normal heart sounds.   Pulmonary/Chest: Effort normal and breath sounds normal. No respiratory distress. He has no wheezes. He has no rales. He exhibits no tenderness.  Abdominal: Soft. Bowel sounds are normal. He exhibits no distension. There is no tenderness. There is no rebound.  Musculoskeletal: He exhibits no edema.  Vitals reviewed.         Assessment & Plan:  Atrial fibrillation, unspecified - Plan: PT with INR/Fingerstick  INR is therapeutic. Do not change his Coumadin dose and recheck in 4-6 weeks. I will recheck a hemoglobin A1c and a fasting lipid panel in August to confirm improvement after his therapeutic lifestyle changes.

## 2015-07-21 ENCOUNTER — Ambulatory Visit (INDEPENDENT_AMBULATORY_CARE_PROVIDER_SITE_OTHER): Payer: Medicare Other | Admitting: Ophthalmology

## 2015-07-21 DIAGNOSIS — E11319 Type 2 diabetes mellitus with unspecified diabetic retinopathy without macular edema: Secondary | ICD-10-CM

## 2015-07-21 DIAGNOSIS — I10 Essential (primary) hypertension: Secondary | ICD-10-CM

## 2015-07-21 DIAGNOSIS — H35033 Hypertensive retinopathy, bilateral: Secondary | ICD-10-CM | POA: Diagnosis not present

## 2015-07-21 DIAGNOSIS — H43813 Vitreous degeneration, bilateral: Secondary | ICD-10-CM

## 2015-07-21 DIAGNOSIS — E113392 Type 2 diabetes mellitus with moderate nonproliferative diabetic retinopathy without macular edema, left eye: Secondary | ICD-10-CM

## 2015-07-21 DIAGNOSIS — E113591 Type 2 diabetes mellitus with proliferative diabetic retinopathy without macular edema, right eye: Secondary | ICD-10-CM

## 2015-07-21 DIAGNOSIS — H2513 Age-related nuclear cataract, bilateral: Secondary | ICD-10-CM

## 2015-07-28 ENCOUNTER — Other Ambulatory Visit: Payer: Self-pay | Admitting: Family Medicine

## 2015-08-30 ENCOUNTER — Ambulatory Visit (INDEPENDENT_AMBULATORY_CARE_PROVIDER_SITE_OTHER): Payer: Medicare Other | Admitting: Family Medicine

## 2015-08-30 ENCOUNTER — Encounter: Payer: Self-pay | Admitting: Family Medicine

## 2015-08-30 DIAGNOSIS — I4891 Unspecified atrial fibrillation: Secondary | ICD-10-CM | POA: Diagnosis not present

## 2015-08-30 LAB — PT WITH INR/FINGERSTICK
INR FINGERSTICK: 2.5 — AB (ref 0.80–1.20)
PT FINGERSTICK: 30.2 s — AB (ref 10.4–12.5)

## 2015-08-30 NOTE — Progress Notes (Signed)
Subjective:    Patient ID: Brian [name], male    DOB: 11-Jun-1955, 60 y.o.   MRN: [id number]  HPI  05/03/15 Last HgA1c was 8.3 in December. Recently had a colonoscopy on 04/27/15.  They did find polyps. However the gastroenterologist believe they were benign. His INR today is subtherapeutic at 1.7 but he is only been back on his Coumadin for 5 days. However it should continue to rise over the next 2-3 days. He is currently on Levemir 70 units in the morning 40 units in the evening. His fasting blood sugars are all less than 130 however he is not checking his two-hour postprandial sugars. He is not quite due for hemoglobin A1c. He will be due for his next office visit.  At that time, my plan was: I do not believe that the patient has reached the full therapeutic effect of the Coumadin yet. Therefore I recommended no changes and recheck his Coumadin in 4 weeks. I believe that his INR will continue to rise over the next 48 hours. His fasting blood sugars sound excellent but I'm concerned that his two-hour postprandial sugars may be high. Therefore I've asked the patient to check his two-hour postprandial sugars and notify me in one week. He is very hesitant to use rapid insulin with meals. Therefore his option would be Actos, Januvia, or trulicity.  Due to ease of use Januvia would be his best option  Also because of his congestive heart failure.  06/06/15 Currently on Levemir 70 units in the morning and 40-50 units in the evening. Fasting blood sugar in the morning is between 160 and 200. Two-hour postprandial sugars in the evening is usually between 150 and 190. There are no episodes of hypoglycemia. He refuses quick acting insulin. He also does not want to take another medication because his next pill option would be name brand medication that would be too expensive. Here today also for an INR check. AT that time, my plan was: Increase Levemir to 70 units in the morning and 70 units in the evening.  Recheck fasting blood sugars and two-hour postprandial sugars in 2 weeks. INR will have to be performed from a venous stick and results will be called to patient.    07/19/15 Patient has drastically changed his diet. His triglycerides have risen from the mid 200s to more than 1200. He is adamant that this was fasting ear however he has changed his diet like to recheck that in August. His hemoglobin A1c was also elevated. He is still on insulin, Levemir, 70 units twice daily. He states his fasting blood sugars are between 90 and 130 and his two-hour postprandial sugars are between 130 and 180. He states that with lifestyle changes his sugars have improved. He is also trying to lose 10 pounds and he wants to recheck his A1c in 3 months. His INR is therapeutic today.  At that time, my plan was: INR is therapeutic. Do not change his Coumadin dose and recheck in 4-6 weeks. I will recheck a hemoglobin A1c and a fasting lipid panel in August to confirm improvement after his therapeutic lifestyle changes.  08/30/15 INR 2.5 on coumadin 10 mg poqday. Past Medical History  Diagnosis Date  . Diabetes mellitus   . Hypertension   . Atrial fibrillation ([place])   . Cardiomyopathy   . Hyperlipidemia   . 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 Zigmund Daniel, laser therapy OD  . Arthritis   . Cerebrovascular accident (  [place])     2012  . Left-sided weakness   . Seizures ([place])     pt reports this is from low blood sugar   Past Surgical History  Procedure Laterality Date  . Ear surgery as a child    . Tonsillectomy     Current Outpatient Prescriptions on File Prior to Visit  Medication Sig Dispense Refill  . amLODipine (NORVASC) 10 MG tablet Take 1 tablet (10 mg total) by mouth daily. 30 tablet 11  . atorvastatin (LIPITOR) 20 MG tablet Take 1 tablet (20 mg total) by mouth daily. 90 tablet 3  . carvedilol (COREG) 25 MG tablet Take 1 tablet (25 mg total) by mouth 2 (two) times daily with a meal. 60 tablet 11   . DUREZOL 0.05 % EMUL INT 1 GTT IN OD BID FOR 2 WKS  2  . hydrALAZINE (APRESOLINE) 50 MG tablet TAKE 1 TABLET BY MOUTH THREE TIMES DAILY. 270 tablet 0  . insulin detemir (LEVEMIR) 100 UNIT/ML injection 70 units in the AM, 40 units at bedtime 30 mL 11  . Insulin Syringe-Needle U-100 (INSULIN SYRINGE .3CC/31GX5/16") 31G X 5/16" 0.3 ML MISC Use twice daily to inject insulin. 100 each 10  . ONE TOUCH ULTRA TEST test strip CHECK BLOOD SUGAR FOUR TIMES DAILY 400 each 3  . warfarin (COUMADIN) 10 MG tablet TAKE 1 TABLET(10 MG) BY MOUTH DAILY 30 tablet 0   No current facility-administered medications on file prior to visit.   Allergies  Allergen Reactions  . Codeine Other (See Comments)    Unknown reaction, severe headach   Social History   Social History  . Marital Status: Divorced    Spouse Name: N/A  . Number of Children: 2  . Years of Education: N/A   Occupational History  . Disabled    Social History Main Topics  . Smoking status: Never Smoker   . Smokeless tobacco: Never Used  . Alcohol Use: 0.0 oz/week    0 Standard drinks or equivalent per week     Comment: seldom  . Drug Use: 7.00 per week     Comment: marijauna for arthritis  . Sexual Activity: Not on file   Other Topics Concern  . Not on file   Social History Narrative   He has children.      Review of Systems  All other systems reviewed and are negative.      Objective:   Physical Exam  Cardiovascular: Normal rate, regular rhythm and normal heart sounds.   Pulmonary/Chest: Effort normal and breath sounds normal. No respiratory distress. He has no wheezes. He has no rales. He exhibits no tenderness.  Abdominal: Soft. Bowel sounds are normal. He exhibits no distension. There is no tenderness. There is no rebound.  Musculoskeletal: He exhibits no edema.  Vitals reviewed.         Assessment & Plan:  Atrial fibrillation, unspecified - Plan: PT with INR/Fingerstick Therapeutic INR.  REcheck in 6  weeks.

## 2015-10-11 ENCOUNTER — Ambulatory Visit (INDEPENDENT_AMBULATORY_CARE_PROVIDER_SITE_OTHER): Payer: Medicare Other | Admitting: Family Medicine

## 2015-10-11 ENCOUNTER — Encounter: Payer: Self-pay | Admitting: Family Medicine

## 2015-10-11 VITALS — BP 146/90 | HR 68 | Temp 97.9°F | Resp 18 | Ht 69.0 in | Wt 209.0 lb

## 2015-10-11 DIAGNOSIS — E119 Type 2 diabetes mellitus without complications: Secondary | ICD-10-CM | POA: Diagnosis not present

## 2015-10-11 DIAGNOSIS — Z794 Long term (current) use of insulin: Secondary | ICD-10-CM

## 2015-10-11 DIAGNOSIS — Z1159 Encounter for screening for other viral diseases: Secondary | ICD-10-CM

## 2015-10-11 DIAGNOSIS — I4891 Unspecified atrial fibrillation: Secondary | ICD-10-CM

## 2015-10-11 LAB — CBC WITH DIFFERENTIAL/PLATELET
BASOS ABS: 0 {cells}/uL (ref 0–200)
Basophils Relative: 0 %
Eosinophils Absolute: 158 cells/uL (ref 15–500)
Eosinophils Relative: 2 %
HEMATOCRIT: 44.3 % (ref 38.5–50.0)
HEMOGLOBIN: 14.8 g/dL (ref 13.0–17.0)
LYMPHS ABS: 1896 {cells}/uL (ref 850–3900)
Lymphocytes Relative: 24 %
MCH: 28.6 pg (ref 27.0–33.0)
MCHC: 33.4 g/dL (ref 32.0–36.0)
MCV: 85.7 fL (ref 80.0–100.0)
MONO ABS: 632 {cells}/uL (ref 200–950)
MPV: 9.5 fL (ref 7.5–12.5)
Monocytes Relative: 8 %
NEUTROS PCT: 66 %
Neutro Abs: 5214 cells/uL (ref 1500–7800)
Platelets: 213 10*3/uL (ref 140–400)
RBC: 5.17 MIL/uL (ref 4.20–5.80)
RDW: 14.3 % (ref 11.0–15.0)
WBC: 7.9 10*3/uL (ref 3.8–10.8)

## 2015-10-11 LAB — LIPID PANEL
Cholesterol: 149 mg/dL (ref 125–200)
HDL: 31 mg/dL — ABNORMAL LOW (ref >=40)
LDL Cholesterol: 73 mg/dL (ref <130)
Total CHOL/HDL Ratio: 4.8 Ratio (ref <=5.0)
Triglycerides: 224 mg/dL — ABNORMAL HIGH (ref <150)
VLDL: 45 mg/dL — ABNORMAL HIGH (ref <30)

## 2015-10-11 LAB — PT WITH INR/FINGERSTICK
INR FINGERSTICK: 3.4 — AB (ref 0.80–1.20)
PT, fingerstick: 40.5 seconds — ABNORMAL HIGH (ref 10.4–12.5)

## 2015-10-11 NOTE — Progress Notes (Signed)
Subjective:    Patient ID: Brian [name], male    DOB: [birth year], 60 y.o.   MRN: [id number]  HPI 05/03/15 Last HgA1c was 8.3 in December. Recently had a colonoscopy on 04/27/15.  They did find polyps. However the gastroenterologist believe they were benign. His INR today is subtherapeutic at 1.7 but he is only been back on his Coumadin for 5 days. However it should continue to rise over the next 2-3 days. He is currently on Levemir 70 units in the morning 40 units in the evening. His fasting blood sugars are all less than 130 however he is not checking his two-hour postprandial sugars. He is not quite due for hemoglobin A1c. He will be due for his next office visit.  At that time, my plan was: I do not believe that the patient has reached the full therapeutic effect of the Coumadin yet. Therefore I recommended no changes and recheck his Coumadin in 4 weeks. I believe that his INR will continue to rise over the next 48 hours. His fasting blood sugars sound excellent but I'm concerned that his two-hour postprandial sugars may be high. Therefore I've asked the patient to check his two-hour postprandial sugars and notify me in one week. He is very hesitant to use rapid insulin with meals. Therefore his option would be Actos, Januvia, or trulicity.  Due to ease of use Januvia would be his best option  Also because of his congestive heart failure.  06/06/15 Currently on Levemir 70 units in the morning and 40-50 units in the evening. Fasting blood sugar in the morning is between 160 and 200. Two-hour postprandial sugars in the evening is usually between 150 and 190. There are no episodes of hypoglycemia. He refuses quick acting insulin. He also does not want to take another medication because his next pill option would be name brand medication that would be too expensive. Here today also for an INR check. AT that time, my plan was: Increase Levemir to 70 units in the morning and 70 units in the evening.  Recheck fasting blood sugars and two-hour postprandial sugars in 2 weeks. INR will have to be performed from a venous stick and results will be called to patient.    07/19/15 Patient has drastically changed his diet. His triglycerides have risen from the mid 200s to more than 1200. He is adamant that this was fasting ear however he has changed his diet like to recheck that in August. His hemoglobin A1c was also elevated. He is still on insulin, Levemir, 70 units twice daily. He states his fasting blood sugars are between 90 and 130 and his two-hour postprandial sugars are between 130 and 180. He states that with lifestyle changes his sugars have improved. He is also trying to lose 10 pounds and he wants to recheck his A1c in 3 months. His INR is therapeutic today.  At that time, my plan was: INR is therapeutic. Do not change his Coumadin dose and recheck in 4-6 weeks. I will recheck a hemoglobin A1c and a fasting lipid panel in August to confirm improvement after his therapeutic lifestyle changes.  08/30/15 INR 2.5 on coumadin 10 mg poqday. At that time, my plan was: Therapeutic INR.  REcheck in 6 weeks  10/11/15 He is here today for follow-up. His INR is supratherapeutic at 3.4. He denies any changes in his medication. He denies taking any antibiotics or other medications that may affect his Coumadin dosage. He denies any bleeding or bruising. He  is currently on Levemir 70 units in the morning, and 40 units in the evening. He reports fasting blood sugars typically between 140 and 160 in the morning. He reports 12 postprandial sugars less than 160 in the afternoon. He refuses rapid acting insulin. He admits that he has not been exercising at all. Diabetic eye exam is up-to-date from January. Diabetic foot exam is performed today and is normal aside from thick discolored toenails. He denies any myalgias or right upper quadrant pain on his statin. Past Medical History:  Diagnosis Date  . Arthritis   .  Atrial fibrillation ([place])   . Cardiomyopathy   . Cerebrovascular accident ([place])    2012  . Diabetes mellitus   . Hyperlipidemia   . Hypertension   . Left-sided weakness   . 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 Zigmund Daniel, laser therapy OD  . Seizures ([place])    pt reports this is from low blood sugar   Past Surgical History:  Procedure Laterality Date  . Ear surgery as a child    . TONSILLECTOMY     Current Outpatient Prescriptions on File Prior to Visit  Medication Sig Dispense Refill  . amLODipine (NORVASC) 10 MG tablet Take 1 tablet (10 mg total) by mouth daily. 30 tablet 11  . atorvastatin (LIPITOR) 20 MG tablet Take 1 tablet (20 mg total) by mouth daily. 90 tablet 3  . carvedilol (COREG) 25 MG tablet Take 1 tablet (25 mg total) by mouth 2 (two) times daily with a meal. 60 tablet 11  . DUREZOL 0.05 % EMUL INT 1 GTT IN OD BID FOR 2 WKS  2  . hydrALAZINE (APRESOLINE) 50 MG tablet TAKE 1 TABLET BY MOUTH THREE TIMES DAILY. 270 tablet 0  . insulin detemir (LEVEMIR) 100 UNIT/ML injection 70 units in the AM, 40 units at bedtime 30 mL 11  . Insulin Syringe-Needle U-100 (INSULIN SYRINGE .3CC/31GX5/16") 31G X 5/16" 0.3 ML MISC Use twice daily to inject insulin. 100 each 10  . ONE TOUCH ULTRA TEST test strip CHECK BLOOD SUGAR FOUR TIMES DAILY 400 each 3  . warfarin (COUMADIN) 10 MG tablet TAKE 1 TABLET(10 MG) BY MOUTH DAILY 30 tablet 0   No current facility-administered medications on file prior to visit.    Allergies  Allergen Reactions  . Codeine Other (See Comments)    Unknown reaction, severe headach   Social History   Social History  . Marital status: Divorced    Spouse name: N/A  . Number of children: 2  . Years of education: N/A   Occupational History  . Disabled    Social History Main Topics  . Smoking status: Never Smoker  . Smokeless tobacco: Never Used  . Alcohol use 0.0 oz/week     Comment: seldom  . Drug use:     Frequency: 7.0 times per week      Comment: marijauna for arthritis  . Sexual activity: Not on file   Other Topics Concern  . Not on file   Social History Narrative   He has children.      Review of Systems  All other systems reviewed and are negative.      Objective:   Physical Exam  Cardiovascular: Normal rate, regular rhythm and normal heart sounds.   Pulmonary/Chest: Effort normal and breath sounds normal. No respiratory distress. He has no wheezes. He has no rales. He exhibits no tenderness.  Abdominal: Soft. Bowel sounds are normal. He exhibits no distension. There  is no tenderness. There is no rebound.  Musculoskeletal: He exhibits no edema.  Vitals reviewed.         Assessment & Plan:  Need for hepatitis C screening test - Plan: Hepatitis C Antibody  Atrial fibrillation, unspecified - Plan: PT with INR/Fingerstick  Diabetes mellitus, type II, insulin dependent ([place]) - Plan: COMPLETE METABOLIC PANEL WITH GFR, Lipid panel, Hemoglobin A1c  Blood pressure today is borderline. I will have the patient check his blood pressure frequently at home. Typically his blood pressures been controlled recently. He will notify me if greater than 140/90. I will check a fasting lipid panel today. Given his history of stroke, his goal LDL cholesterol is less than 70. I will also check a hemoglobin A1c. His goal hemoglobin A1c is less than 7. I have recommended increasing aerobic exercise. INR is supratherapeutic today. I have recommended holding his Coumadin dose this afternoon and resuming his previous Coumadin dose and rechecking in 6 weeks. The previous 2 times we have checked his Coumadin he has been therapeutic and therefore I hesitate to make any changes based on one aberrant reading.

## 2015-10-12 LAB — HEMOGLOBIN A1C
HEMOGLOBIN A1C: 7.2 % — AB (ref <5.7)
MEAN PLASMA GLUCOSE: 160 mg/dL

## 2015-10-12 LAB — HEPATITIS C ANTIBODY: HCV Ab: NEGATIVE

## 2015-10-14 ENCOUNTER — Encounter: Payer: Self-pay | Admitting: Family Medicine

## 2015-11-07 ENCOUNTER — Other Ambulatory Visit: Payer: Self-pay | Admitting: Family Medicine

## 2015-11-22 ENCOUNTER — Ambulatory Visit (INDEPENDENT_AMBULATORY_CARE_PROVIDER_SITE_OTHER): Payer: Medicare Other | Admitting: Family Medicine

## 2015-11-22 ENCOUNTER — Encounter: Payer: Self-pay | Admitting: Family Medicine

## 2015-11-22 VITALS — BP 130/70 | HR 74 | Temp 98.4°F | Resp 18 | Ht 69.0 in | Wt 214.0 lb

## 2015-11-22 DIAGNOSIS — I4891 Unspecified atrial fibrillation: Secondary | ICD-10-CM | POA: Diagnosis not present

## 2015-11-22 DIAGNOSIS — Z23 Encounter for immunization: Secondary | ICD-10-CM

## 2015-11-22 LAB — PT WITH INR/FINGERSTICK
INR, fingerstick: 3.5 — ABNORMAL HIGH (ref 0.80–1.20)
PT, fingerstick: 41.8 seconds — ABNORMAL HIGH (ref 10.4–12.5)

## 2015-11-22 NOTE — Progress Notes (Signed)
Subjective:    Patient ID: Brian [name], male    DOB: Jun 08, 1955, 60 y.o.   MRN: [id number]  Medication Refill    05/03/15 Last HgA1c was 8.3 in December. Recently had a colonoscopy on 04/27/15.  They did find polyps. However the gastroenterologist believe they were benign. His INR today is subtherapeutic at 1.7 but he is only been back on his Coumadin for 5 days. However it should continue to rise over the next 2-3 days. He is currently on Levemir 70 units in the morning 40 units in the evening. His fasting blood sugars are all less than 130 however he is not checking his two-hour postprandial sugars. He is not quite due for hemoglobin A1c. He will be due for his next office visit.  At that time, my plan was: I do not believe that the patient has reached the full therapeutic effect of the Coumadin yet. Therefore I recommended no changes and recheck his Coumadin in 4 weeks. I believe that his INR will continue to rise over the next 48 hours. His fasting blood sugars sound excellent but I'm concerned that his two-hour postprandial sugars may be high. Therefore I've asked the patient to check his two-hour postprandial sugars and notify me in one week. He is very hesitant to use rapid insulin with meals. Therefore his option would be Actos, Januvia, or trulicity.  Due to ease of use Januvia would be his best option  Also because of his congestive heart failure.  06/06/15 Currently on Levemir 70 units in the morning and 40-50 units in the evening. Fasting blood sugar in the morning is between 160 and 200. Two-hour postprandial sugars in the evening is usually between 150 and 190. There are no episodes of hypoglycemia. He refuses quick acting insulin. He also does not want to take another medication because his next pill option would be name brand medication that would be too expensive. Here today also for an INR check. AT that time, my plan was: Increase Levemir to 70 units in the morning and 70 units in  the evening. Recheck fasting blood sugars and two-hour postprandial sugars in 2 weeks. INR will have to be performed from a venous stick and results will be called to patient.    07/19/15 Patient has drastically changed his diet. His triglycerides have risen from the mid 200s to more than 1200. He is adamant that this was fasting ear however he has changed his diet like to recheck that in August. His hemoglobin A1c was also elevated. He is still on insulin, Levemir, 70 units twice daily. He states his fasting blood sugars are between 90 and 130 and his two-hour postprandial sugars are between 130 and 180. He states that with lifestyle changes his sugars have improved. He is also trying to lose 10 pounds and he wants to recheck his A1c in 3 months. His INR is therapeutic today.  At that time, my plan was: INR is therapeutic. Do not change his Coumadin dose and recheck in 4-6 weeks. I will recheck a hemoglobin A1c and a fasting lipid panel in August to confirm improvement after his therapeutic lifestyle changes.  08/30/15 INR 2.5 on coumadin 10 mg poqday. At that time, my plan was: Therapeutic INR.  REcheck in 6 weeks  10/11/15 He is here today for follow-up. His INR is supratherapeutic at 3.4. He denies any changes in his medication. He denies taking any antibiotics or other medications that may affect his Coumadin dosage. He denies any  bleeding or bruising. He is currently on Levemir 70 units in the morning, and 40 units in the evening. He reports fasting blood sugars typically between 140 and 160 in the morning. He reports 12 postprandial sugars less than 160 in the afternoon. He refuses rapid acting insulin. He admits that he has not been exercising at all. Diabetic eye exam is up-to-date from January. Diabetic foot exam is performed today and is normal aside from thick discolored toenails. He denies any myalgias or right upper quadrant pain on his statin.  At that time, my plan was: Blood pressure today  is borderline. I will have the patient check his blood pressure frequently at home. Typically his blood pressures been controlled recently. He will notify me if greater than 140/90. I will check a fasting lipid panel today. Given his history of stroke, his goal LDL cholesterol is less than 70. I will also check a hemoglobin A1c. His goal hemoglobin A1c is less than 7. I have recommended increasing aerobic exercise. INR is supratherapeutic today. I have recommended holding his Coumadin dose this afternoon and resuming his previous Coumadin dose and rechecking in 6 weeks. The previous 2 times we have checked his Coumadin he has been therapeutic and therefore I hesitate to make any changes based on one aberrant reading.  11/22/15 INR is supratherapeutic at 3.5.  He is taking 10 mg poqday. Past Medical History:  Diagnosis Date  . Arthritis   . Atrial fibrillation ([place])   . Cardiomyopathy   . Cerebrovascular accident ([place])    2012  . Diabetes mellitus   . Hyperlipidemia   . Hypertension   . Left-sided weakness   . 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 Zigmund Daniel, laser therapy OD  . Seizures ([place])    pt reports this is from low blood sugar   Past Surgical History:  Procedure Laterality Date  . Ear surgery as a child    . TONSILLECTOMY     Current Outpatient Prescriptions on File Prior to Visit  Medication Sig Dispense Refill  . amLODipine (NORVASC) 10 MG tablet TAKE 1 TABLET(10 MG) BY MOUTH DAILY 30 tablet 3  . atorvastatin (LIPITOR) 20 MG tablet Take 1 tablet (20 mg total) by mouth daily. 90 tablet 3  . carvedilol (COREG) 25 MG tablet Take 1 tablet (25 mg total) by mouth 2 (two) times daily with a meal. 60 tablet 11  . DUREZOL 0.05 % EMUL INT 1 GTT IN OD BID FOR 2 WKS  2  . hydrALAZINE (APRESOLINE) 50 MG tablet TAKE 1 TABLET BY MOUTH THREE TIMES DAILY. 270 tablet 0  . insulin detemir (LEVEMIR) 100 UNIT/ML injection 70 units in the AM, 40 units at bedtime 30 mL 11  . Insulin  Syringe-Needle U-100 (INSULIN SYRINGE .3CC/31GX5/16") 31G X 5/16" 0.3 ML MISC Use twice daily to inject insulin. 100 each 10  . ONE TOUCH ULTRA TEST test strip CHECK BLOOD SUGAR FOUR TIMES DAILY 400 each 3  . warfarin (COUMADIN) 10 MG tablet TAKE 1 TABLET(10 MG) BY MOUTH DAILY 30 tablet 0   No current facility-administered medications on file prior to visit.    Allergies  Allergen Reactions  . Codeine Other (See Comments)    Unknown reaction, severe headach   Social History   Social History  . Marital status: Divorced    Spouse name: N/A  . Number of children: 2  . Years of education: N/A   Occupational History  . Disabled    Social  History Main Topics  . Smoking status: Never Smoker  . Smokeless tobacco: Never Used  . Alcohol use 0.0 oz/week     Comment: seldom  . Drug use:     Frequency: 7.0 times per week     Comment: marijauna for arthritis  . Sexual activity: Not on file   Other Topics Concern  . Not on file   Social History Narrative   He has children.      Review of Systems  All other systems reviewed and are negative.      Objective:   Physical Exam  Cardiovascular: Normal rate, regular rhythm and normal heart sounds.   Pulmonary/Chest: Effort normal and breath sounds normal. No respiratory distress. He has no wheezes. He has no rales. He exhibits no tenderness.  Abdominal: Soft. Bowel sounds are normal. He exhibits no distension. There is no tenderness. There is no rebound.  Musculoskeletal: He exhibits no edema.  Vitals reviewed.         Assessment & Plan:  Atrial fibrillation (HCC)  Coumadin 10 mg poqday except Monday and Friday when he takes 5 mg poqday.  Recheck in 4 weeks.

## 2015-11-22 NOTE — Addendum Note (Signed)
Addended by: Shary Decamp B on: 11/22/2015 10:46 AM   Modules accepted: Orders

## 2015-11-26 ENCOUNTER — Other Ambulatory Visit: Payer: Self-pay | Admitting: Family Medicine

## 2015-12-30 ENCOUNTER — Ambulatory Visit (INDEPENDENT_AMBULATORY_CARE_PROVIDER_SITE_OTHER): Payer: Medicare Other | Admitting: Family Medicine

## 2015-12-30 ENCOUNTER — Encounter: Payer: Self-pay | Admitting: Family Medicine

## 2015-12-30 DIAGNOSIS — I4891 Unspecified atrial fibrillation: Secondary | ICD-10-CM | POA: Diagnosis not present

## 2015-12-30 LAB — PT WITH INR/FINGERSTICK
INR, fingerstick: 1.9 — ABNORMAL HIGH (ref 0.80–1.20)
PT, fingerstick: 23 seconds — ABNORMAL HIGH (ref 10.4–12.5)

## 2015-12-30 NOTE — Progress Notes (Signed)
Subjective:    Patient ID: Brian [name], male    DOB: 02-03-1956, 60 y.o.   MRN: [id number]  Medication Refill    05/03/15 Last HgA1c was 8.3 in December. Recently had a colonoscopy on 04/27/15.  They did find polyps. However the gastroenterologist believe they were benign. His INR today is subtherapeutic at 1.7 but he is only been back on his Coumadin for 5 days. However it should continue to rise over the next 2-3 days. He is currently on Levemir 70 units in the morning 40 units in the evening. His fasting blood sugars are all less than 130 however he is not checking his two-hour postprandial sugars. He is not quite due for hemoglobin A1c. He will be due for his next office visit.  At that time, my plan was: I do not believe that the patient has reached the full therapeutic effect of the Coumadin yet. Therefore I recommended no changes and recheck his Coumadin in 4 weeks. I believe that his INR will continue to rise over the next 48 hours. His fasting blood sugars sound excellent but I'm concerned that his two-hour postprandial sugars may be high. Therefore I've asked the patient to check his two-hour postprandial sugars and notify me in one week. He is very hesitant to use rapid insulin with meals. Therefore his option would be Actos, Januvia, or trulicity.  Due to ease of use Januvia would be his best option  Also because of his congestive heart failure.  06/06/15 Currently on Levemir 70 units in the morning and 40-50 units in the evening. Fasting blood sugar in the morning is between 160 and 200. Two-hour postprandial sugars in the evening is usually between 150 and 190. There are no episodes of hypoglycemia. He refuses quick acting insulin. He also does not want to take another medication because his next pill option would be name brand medication that would be too expensive. Here today also for an INR check. AT that time, my plan was: Increase Levemir to 70 units in the morning and 70 units  in the evening. Recheck fasting blood sugars and two-hour postprandial sugars in 2 weeks. INR will have to be performed from a venous stick and results will be called to patient.    07/19/15 Patient has drastically changed his diet. His triglycerides have risen from the mid 200s to more than 1200. He is adamant that this was fasting ear however he has changed his diet like to recheck that in August. His hemoglobin A1c was also elevated. He is still on insulin, Levemir, 70 units twice daily. He states his fasting blood sugars are between 90 and 130 and his two-hour postprandial sugars are between 130 and 180. He states that with lifestyle changes his sugars have improved. He is also trying to lose 10 pounds and he wants to recheck his A1c in 3 months. His INR is therapeutic today.  At that time, my plan was: INR is therapeutic. Do not change his Coumadin dose and recheck in 4-6 weeks. I will recheck a hemoglobin A1c and a fasting lipid panel in August to confirm improvement after his therapeutic lifestyle changes.  08/30/15 INR 2.5 on coumadin 10 mg poqday. At that time, my plan was: Therapeutic INR.  REcheck in 6 weeks  10/11/15 He is here today for follow-up. His INR is supratherapeutic at 3.4. He denies any changes in his medication. He denies taking any antibiotics or other medications that may affect his Coumadin dosage. He denies  any bleeding or bruising. He is currently on Levemir 70 units in the morning, and 40 units in the evening. He reports fasting blood sugars typically between 140 and 160 in the morning. He reports 12 postprandial sugars less than 160 in the afternoon. He refuses rapid acting insulin. He admits that he has not been exercising at all. Diabetic eye exam is up-to-date from January. Diabetic foot exam is performed today and is normal aside from thick discolored toenails. He denies any myalgias or right upper quadrant pain on his statin.  At that time, my plan was: Blood pressure  today is borderline. I will have the patient check his blood pressure frequently at home. Typically his blood pressures been controlled recently. He will notify me if greater than 140/90. I will check a fasting lipid panel today. Given his history of stroke, his goal LDL cholesterol is less than 70. I will also check a hemoglobin A1c. His goal hemoglobin A1c is less than 7. I have recommended increasing aerobic exercise. INR is supratherapeutic today. I have recommended holding his Coumadin dose this afternoon and resuming his previous Coumadin dose and rechecking in 6 weeks. The previous 2 times we have checked his Coumadin he has been therapeutic and therefore I hesitate to make any changes based on one aberrant reading.  11/22/15 INR is supratherapeutic at 3.5.  He is taking 10 mg poqday.  At that time, my plan was:  Coumadin 10 mg poqday except Monday and Friday when he takes 5 mg poqday.  Recheck in 4 weeks.    12/30/15 Day his INR is borderline at 1.9. However today he also took 5 mg of Coumadin rather than the 10 he takes the majority of the time. Past Medical History:  Diagnosis Date  . Arthritis   . Atrial fibrillation ([place])   . Cardiomyopathy   . Cerebrovascular accident ([place])    2012  . Diabetes mellitus   . Hyperlipidemia   . Hypertension   . Left-sided weakness   . 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 Zigmund Daniel, laser therapy OD  . Seizures ([place])    pt reports this is from low blood sugar   Past Surgical History:  Procedure Laterality Date  . Ear surgery as a child    . TONSILLECTOMY     Current Outpatient Prescriptions on File Prior to Visit  Medication Sig Dispense Refill  . amLODipine (NORVASC) 10 MG tablet TAKE 1 TABLET(10 MG) BY MOUTH DAILY 30 tablet 3  . atorvastatin (LIPITOR) 20 MG tablet Take 1 tablet (20 mg total) by mouth daily. 90 tablet 3  . carvedilol (COREG) 25 MG tablet Take 1 tablet (25 mg total) by mouth 2 (two) times daily with a meal. 60  tablet 11  . DUREZOL 0.05 % EMUL INT 1 GTT IN OD BID FOR 2 WKS  2  . hydrALAZINE (APRESOLINE) 50 MG tablet TAKE 1 TABLET BY MOUTH THREE TIMES DAILY. 270 tablet 3  . insulin detemir (LEVEMIR) 100 UNIT/ML injection 70 units in the AM, 40 units at bedtime 30 mL 11  . Insulin Syringe-Needle U-100 (INSULIN SYRINGE .3CC/31GX5/16") 31G X 5/16" 0.3 ML MISC Use twice daily to inject insulin. 100 each 10  . ONE TOUCH ULTRA TEST test strip CHECK BLOOD SUGAR FOUR TIMES DAILY 400 each 3  . warfarin (COUMADIN) 10 MG tablet TAKE 1 TABLET(10 MG) BY MOUTH DAILY 30 tablet 0   No current facility-administered medications on file prior to visit.  Allergies  Allergen Reactions  . Codeine Other (See Comments)    Unknown reaction, severe headach   Social History   Social History  . Marital status: Divorced    Spouse name: N/A  . Number of children: 2  . Years of education: N/A   Occupational History  . Disabled    Social History Main Topics  . Smoking status: Never Smoker  . Smokeless tobacco: Never Used  . Alcohol use 0.0 oz/week     Comment: seldom  . Drug use:     Frequency: 7.0 times per week     Comment: marijauna for arthritis  . Sexual activity: Not on file   Other Topics Concern  . Not on file   Social History Narrative   He has children.                           Review of Systems  All other systems reviewed and are negative.      Objective:   Physical Exam  Cardiovascular: Normal rate, regular rhythm and normal heart sounds.   Pulmonary/Chest: Effort normal and breath sounds normal. No respiratory distress. He has no wheezes. He has no rales. He exhibits no tenderness.  Abdominal: Soft. Bowel sounds are normal. He exhibits no distension. There is no tenderness. There is no rebound.  Musculoskeletal: He exhibits no edema.  Vitals reviewed.         Assessment & Plan:  Atrial fibrillation ([place]) - Plan: PT with INR/Fingerstick  Continue 10 mg of  Coumadin every day except Monday and Friday. On Mondays and Fridays 5 mg of Coumadin. Recheck INR in 4 weeks. If borderline in 4 weeks, the patient would need to switch to 10 mg every day and 7.5 on Mondays and Fridays. This would necessitate a different prescription

## 2016-01-02 ENCOUNTER — Other Ambulatory Visit: Payer: Self-pay | Admitting: Family Medicine

## 2016-01-23 ENCOUNTER — Ambulatory Visit (INDEPENDENT_AMBULATORY_CARE_PROVIDER_SITE_OTHER): Payer: Medicare Other | Admitting: Ophthalmology

## 2016-01-23 DIAGNOSIS — E113392 Type 2 diabetes mellitus with moderate nonproliferative diabetic retinopathy without macular edema, left eye: Secondary | ICD-10-CM

## 2016-01-23 DIAGNOSIS — I10 Essential (primary) hypertension: Secondary | ICD-10-CM | POA: Diagnosis not present

## 2016-01-23 DIAGNOSIS — E113591 Type 2 diabetes mellitus with proliferative diabetic retinopathy without macular edema, right eye: Secondary | ICD-10-CM

## 2016-01-23 DIAGNOSIS — H43813 Vitreous degeneration, bilateral: Secondary | ICD-10-CM | POA: Diagnosis not present

## 2016-01-23 DIAGNOSIS — H35033 Hypertensive retinopathy, bilateral: Secondary | ICD-10-CM

## 2016-01-23 DIAGNOSIS — E11319 Type 2 diabetes mellitus with unspecified diabetic retinopathy without macular edema: Secondary | ICD-10-CM | POA: Diagnosis not present

## 2016-02-03 ENCOUNTER — Other Ambulatory Visit: Payer: Self-pay | Admitting: Family Medicine

## 2016-02-03 MED ORDER — INSULIN PEN NEEDLE 31G X 5 MM MISC: 5 refills | Status: DC

## 2016-02-10 ENCOUNTER — Encounter: Payer: Self-pay | Admitting: Family Medicine

## 2016-02-10 ENCOUNTER — Ambulatory Visit (INDEPENDENT_AMBULATORY_CARE_PROVIDER_SITE_OTHER): Payer: Medicare Other | Admitting: Family Medicine

## 2016-02-10 VITALS — BP 132/80 | HR 78 | Temp 98.4°F | Resp 18 | Ht 69.0 in | Wt 216.0 lb

## 2016-02-10 DIAGNOSIS — I4891 Unspecified atrial fibrillation: Secondary | ICD-10-CM | POA: Diagnosis not present

## 2016-02-10 DIAGNOSIS — Z794 Long term (current) use of insulin: Secondary | ICD-10-CM

## 2016-02-10 DIAGNOSIS — E119 Type 2 diabetes mellitus without complications: Secondary | ICD-10-CM | POA: Diagnosis not present

## 2016-02-10 LAB — HEMOGLOBIN A1C
HEMOGLOBIN A1C: 7.7 % — AB (ref <5.7)
MEAN PLASMA GLUCOSE: 174 mg/dL

## 2016-02-10 LAB — COMPLETE METABOLIC PANEL WITH GFR
ALBUMIN: 3.9 g/dL (ref 3.6–5.1)
ALK PHOS: 62 U/L (ref 40–115)
ALT: 24 U/L (ref 9–46)
AST: 17 U/L (ref 10–35)
BILIRUBIN TOTAL: 0.6 mg/dL (ref 0.2–1.2)
BUN: 15 mg/dL (ref 7–25)
CALCIUM: 8.8 mg/dL (ref 8.6–10.3)
CO2: 22 mmol/L (ref 20–31)
Chloride: 101 mmol/L (ref 98–110)
Creat: 1.19 mg/dL (ref 0.70–1.25)
GFR, EST AFRICAN AMERICAN: 75 mL/min (ref >=60)
GFR, EST NON AFRICAN AMERICAN: 65 mL/min (ref >=60)
GLUCOSE: 241 mg/dL — AB (ref 70–99)
Potassium: 4.1 mmol/L (ref 3.5–5.3)
SODIUM: 135 mmol/L (ref 135–146)
TOTAL PROTEIN: 6.8 g/dL (ref 6.1–8.1)

## 2016-02-10 LAB — PT WITH INR/FINGERSTICK
INR, fingerstick: 2.9 — ABNORMAL HIGH (ref 0.80–1.20)
PT, fingerstick: 35 seconds — ABNORMAL HIGH (ref 10.4–12.5)

## 2016-02-10 LAB — LIPID PANEL
CHOLESTEROL: 250 mg/dL — AB (ref <200)
HDL: 27 mg/dL — AB (ref >40)
Total CHOL/HDL Ratio: 9.3 Ratio — ABNORMAL HIGH (ref <5.0)
Triglycerides: 445 mg/dL — ABNORMAL HIGH (ref <150)

## 2016-02-10 NOTE — Progress Notes (Signed)
Subjective:    Patient ID: Brian [name], male    DOB: Nov 07, 1955, 60 y.o.   MRN: [id number]  Medication Refill     Today his INR is borderline at 2.9. He denies any bleeding or bruising. He denies any diet changes. He is also due to recheck his hemoglobin A1c. His last hemoglobin A1c was 7.2 in August. His goal hemoglobin A1c is less than 7. He denies any hypoglycemic episodes. He denies any chest pain shortness of breath or dyspnea on exertion. His blood pressure today is well controlled at 132/80. He is also due for fasting lipid panel Past Medical History:  Diagnosis Date  . Arthritis   . Atrial fibrillation ([place])   . Cardiomyopathy   . Cerebrovascular accident ([place])    2012  . Diabetes mellitus   . Hyperlipidemia   . Hypertension   . Left-sided weakness   . 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)    Dr. Zigmund Daniel, laser therapy OD  . Seizures ([place])    pt reports this is from low blood sugar   Past Surgical History:  Procedure Laterality Date  . Ear surgery as a child    . TONSILLECTOMY     Current Outpatient Prescriptions on File Prior to Visit  Medication Sig Dispense Refill  . amLODipine (NORVASC) 10 MG tablet TAKE 1 TABLET(10 MG) BY MOUTH DAILY 30 tablet 3  . atorvastatin (LIPITOR) 20 MG tablet Take 1 tablet (20 mg total) by mouth daily. 90 tablet 3  . carvedilol (COREG) 25 MG tablet Take 1 tablet (25 mg total) by mouth 2 (two) times daily with a meal. 60 tablet 11  . DUREZOL 0.05 % EMUL INT 1 GTT IN OD BID FOR 2 WKS  2  . hydrALAZINE (APRESOLINE) 50 MG tablet TAKE 1 TABLET BY MOUTH THREE TIMES DAILY. 270 tablet 3  . Insulin Pen Needle 31G X 5 MM MISC Use daily with insulin 100 each 5  . Insulin Syringe-Needle U-100 (INSULIN SYRINGE .3CC/31GX5/16") 31G X 5/16" 0.3 ML MISC Use twice daily to inject insulin. 100 each 10  . LEVEMIR FLEXTOUCH 100 UNIT/ML Pen INJECT 70 UNITS UNDER THE SKIN EVERY MORNING AND 40 UNITS AT BEDTIME 135 mL 0  . ONE TOUCH ULTRA TEST test  strip CHECK BLOOD SUGAR FOUR TIMES DAILY 400 each 3  . warfarin (COUMADIN) 10 MG tablet TAKE 1 TABLET(10 MG) BY MOUTH DAILY (Patient taking differently: 10 mg qd except 5 mg F&M) 30 tablet 0   No current facility-administered medications on file prior to visit.    Allergies  Allergen Reactions  . Codeine Other (See Comments)    Unknown reaction, severe headach   Social History   Social History  . Marital status: Divorced    Spouse name: N/A  . Number of children: 2  . Years of education: N/A   Occupational History  . Disabled    Social History Main Topics  . Smoking status: Never Smoker  . Smokeless tobacco: Never Used  . Alcohol use 0.0 oz/week     Comment: seldom  . Drug use:     Frequency: 7.0 times per week     Comment: marijauna for arthritis  . Sexual activity: Not on file   Other Topics Concern  . Not on file   Social History Narrative   He has children.  Review of Systems  All other systems reviewed and are negative.      Objective:   Physical Exam  Cardiovascular: Normal rate, regular rhythm and normal heart sounds.   Pulmonary/Chest: Effort normal and breath sounds normal. No respiratory distress. He has no wheezes. He has no rales. He exhibits no tenderness.  Abdominal: Soft. Bowel sounds are normal. He exhibits no distension. There is no tenderness. There is no rebound.  Musculoskeletal: He exhibits no edema.  Vitals reviewed.         Assessment & Plan:  Controlled type 2 diabetes mellitus without complication, with long-term current use of insulin ([place]) - Plan: COMPLETE METABOLIC PANEL WITH GFR, Lipid panel, Hemoglobin A1c  Atrial fibrillation ([place]) - Plan: PT with INR/Fingerstick  Continue 10 mg of Coumadin every day except Monday and Friday. On Mondays and Fridays 5 mg of Coumadin. Recheck INR in 4 weeks.

## 2016-02-27 ENCOUNTER — Other Ambulatory Visit: Payer: Self-pay | Admitting: Family Medicine

## 2016-02-27 MED ORDER — CARVEDILOL 25 MG PO TABS: 25.0000 mg | ORAL_TABLET | Freq: Two times a day (BID) | ORAL | 3 refills | Status: DC

## 2016-02-27 MED ORDER — ATORVASTATIN CALCIUM 20 MG PO TABS: 20.0000 mg | ORAL_TABLET | Freq: Every day | ORAL | 3 refills | Status: DC

## 2016-03-14 ENCOUNTER — Other Ambulatory Visit: Payer: Self-pay | Admitting: Family Medicine

## 2016-03-14 MED ORDER — WARFARIN SODIUM 10 MG PO TABS: ORAL_TABLET | ORAL | 2 refills | Status: DC

## 2016-03-14 NOTE — Telephone Encounter (Signed)
Needs Warfarin to go to his new [facility] on [address].  Rx sent  Medication refilled per protocol.

## 2016-03-23 ENCOUNTER — Ambulatory Visit: Payer: Medicare HMO | Admitting: Family Medicine

## 2016-04-01 ENCOUNTER — Other Ambulatory Visit: Payer: Self-pay | Admitting: Family Medicine

## 2016-04-02 ENCOUNTER — Encounter: Payer: Self-pay | Admitting: Family Medicine

## 2016-04-02 ENCOUNTER — Ambulatory Visit (INDEPENDENT_AMBULATORY_CARE_PROVIDER_SITE_OTHER): Payer: Medicare HMO | Admitting: Family Medicine

## 2016-04-02 DIAGNOSIS — I4891 Unspecified atrial fibrillation: Secondary | ICD-10-CM

## 2016-04-02 LAB — PT WITH INR/FINGERSTICK
INR, fingerstick: 2.1 — ABNORMAL HIGH (ref 0.80–1.20)
PT, fingerstick: 25.3 seconds — ABNORMAL HIGH (ref 10.4–12.5)

## 2016-04-02 NOTE — Progress Notes (Signed)
Subjective:    Patient ID: Brian [name], male    DOB: 04/18/1955, 61 y.o.   MRN: [id number]  Medication Refill   INR today is therapeutic at 2.1. He denies any bleeding or bruising. He denies any medical issues at the present time Past Medical History:  Diagnosis Date  . Arthritis   . Atrial fibrillation ([place])   . Cardiomyopathy   . Cerebrovascular accident ([place])    2012  . Diabetes mellitus   . Hyperlipidemia   . Hypertension   . Left-sided weakness   . 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 Zigmund Daniel, laser therapy OD  . Seizures ([place])    pt reports this is from low blood sugar   Past Surgical History:  Procedure Laterality Date  . Ear surgery as a child    . TONSILLECTOMY     Current Outpatient Prescriptions on File Prior to Visit  Medication Sig Dispense Refill  . amLODipine (NORVASC) 10 MG tablet TAKE 1 TABLET BY MOUTH EVERY DAY 30 tablet 0  . atorvastatin (LIPITOR) 20 MG tablet Take 1 tablet (20 mg total) by mouth daily. 90 tablet 3  . carvedilol (COREG) 25 MG tablet Take 1 tablet (25 mg total) by mouth 2 (two) times daily with a meal. 180 tablet 3  . DUREZOL 0.05 % EMUL INT 1 GTT IN OD BID FOR 2 WKS  2  . hydrALAZINE (APRESOLINE) 50 MG tablet TAKE 1 TABLET BY MOUTH THREE TIMES DAILY. 270 tablet 3  . Insulin Pen Needle 31G X 5 MM MISC Use daily with insulin 100 each 5  . Insulin Syringe-Needle U-100 (INSULIN SYRINGE .3CC/31GX5/16") 31G X 5/16" 0.3 ML MISC Use twice daily to inject insulin. 100 each 10  . LEVEMIR FLEXTOUCH 100 UNIT/ML Pen INJECT 70 UNITS UNDER THE SKIN EVERY MORNING AND 40 UNITS AT BEDTIME 135 mL 0  . ONE TOUCH ULTRA TEST test strip CHECK BLOOD SUGAR FOUR TIMES DAILY 400 each 3  . warfarin (COUMADIN) 10 MG tablet 10 mg qd except 5 mg F&M 30 tablet 2   No current facility-administered medications on file prior to visit.    Allergies  Allergen Reactions  . Codeine Other (See Comments)    Unknown reaction, severe headach   Social  History   Social History  . Marital status: Divorced    Spouse name: N/A  . Number of children: 2  . Years of education: N/A   Occupational History  . Disabled    Social History Main Topics  . Smoking status: Never Smoker  . Smokeless tobacco: Never Used  . Alcohol use 0.0 oz/week     Comment: seldom  . Drug use: Yes    Frequency: 7.0 times per week     Comment: marijauna for arthritis  . Sexual activity: Not on file   Other Topics Concern  . Not on file   Social History Narrative   He has children.                           Review of Systems  All other systems reviewed and are negative.      Objective:   Physical Exam  Cardiovascular: Normal rate, regular rhythm and normal heart sounds.   Pulmonary/Chest: Effort normal and breath sounds normal. No respiratory distress. He has no wheezes. He has no rales. He exhibits no tenderness.  Abdominal: Soft. Bowel sounds are normal. He exhibits no distension. There  is no tenderness. There is no rebound.  Musculoskeletal: He exhibits no edema.  Vitals reviewed.         Assessment & Plan:  Atrial fibrillation (HCC)  Continue 10 mg of Coumadin every day except Monday and Friday. On Mondays and Fridays 5 mg of Coumadin. Recheck INR in 4 weeks.

## 2016-05-14 ENCOUNTER — Ambulatory Visit (INDEPENDENT_AMBULATORY_CARE_PROVIDER_SITE_OTHER): Payer: Medicare HMO | Admitting: Family Medicine

## 2016-05-14 ENCOUNTER — Encounter: Payer: Self-pay | Admitting: Family Medicine

## 2016-05-14 VITALS — BP 150/88 | HR 72 | Temp 97.8°F | Resp 18 | Ht 69.0 in | Wt 212.0 lb

## 2016-05-14 DIAGNOSIS — Z125 Encounter for screening for malignant neoplasm of prostate: Secondary | ICD-10-CM | POA: Diagnosis not present

## 2016-05-14 DIAGNOSIS — E7800 Pure hypercholesterolemia, unspecified: Secondary | ICD-10-CM

## 2016-05-14 DIAGNOSIS — I4891 Unspecified atrial fibrillation: Secondary | ICD-10-CM | POA: Diagnosis not present

## 2016-05-14 DIAGNOSIS — Z794 Long term (current) use of insulin: Secondary | ICD-10-CM

## 2016-05-14 DIAGNOSIS — E1165 Type 2 diabetes mellitus with hyperglycemia: Secondary | ICD-10-CM | POA: Diagnosis not present

## 2016-05-14 LAB — LIPID PANEL
CHOLESTEROL: 180 mg/dL (ref <200)
HDL: 27 mg/dL — ABNORMAL LOW (ref >40)
LDL CALC: 94 mg/dL (ref <100)
Total CHOL/HDL Ratio: 6.7 Ratio — ABNORMAL HIGH (ref <5.0)
Triglycerides: 297 mg/dL — ABNORMAL HIGH (ref <150)
VLDL: 59 mg/dL — AB (ref <30)

## 2016-05-14 LAB — COMPLETE METABOLIC PANEL WITH GFR
ALT: 26 U/L (ref 9–46)
AST: 18 U/L (ref 10–35)
Albumin: 4.1 g/dL (ref 3.6–5.1)
Alkaline Phosphatase: 76 U/L (ref 40–115)
BILIRUBIN TOTAL: 0.5 mg/dL (ref 0.2–1.2)
BUN: 18 mg/dL (ref 7–25)
CALCIUM: 9.3 mg/dL (ref 8.6–10.3)
CHLORIDE: 103 mmol/L (ref 98–110)
CO2: 22 mmol/L (ref 20–31)
CREATININE: 1.34 mg/dL — AB (ref 0.70–1.25)
GFR, Est African American: 65 mL/min (ref >=60)
GFR, Est Non African American: 56 mL/min — ABNORMAL LOW (ref >=60)
Glucose, Bld: 223 mg/dL — ABNORMAL HIGH (ref 70–99)
Potassium: 4.7 mmol/L (ref 3.5–5.3)
Sodium: 137 mmol/L (ref 135–146)
TOTAL PROTEIN: 6.9 g/dL (ref 6.1–8.1)

## 2016-05-14 LAB — PT WITH INR/FINGERSTICK
INR, fingerstick: 1.8 — ABNORMAL HIGH (ref 0.80–1.20)
PT FINGERSTICK: 21.1 s — AB (ref 10.4–12.5)

## 2016-05-14 LAB — PSA: PSA: 0.7 ng/mL (ref <=4.0)

## 2016-05-14 MED ORDER — METFORMIN HCL 850 MG PO TABS: 850.0000 mg | ORAL_TABLET | Freq: Two times a day (BID) | ORAL | 5 refills | Status: DC

## 2016-05-14 NOTE — Progress Notes (Signed)
Subjective:    Patient ID: Brian [name], male    DOB: 1955/03/08, 61 y.o.   MRN: [id number]  Medication Refill     Takes coumadin 5 mg on Monday and Friday, and 10 mg every other day. Today his INR is 1.8. He denies any bleeding or bruising. He denies any diet changes. He is also due to recheck his hemoglobin A1c. His last hemoglobin A1c was 7.7 in December. His goal hemoglobin A1c is less than 7. He denies any hypoglycemic episodes. However, sugars are 150-220 an dusually 150-170. He denies any chest pain shortness of breath or dyspnea on exertion. His blood pressure today is elevated at 150/88. He is also due for fasting lipid panel.  He denies any myalgias, RUQ pain.  He denies polyuria, polydypsia, or blurry vision.   Past Medical History:  Diagnosis Date  . Arthritis   . Atrial fibrillation ([place])   . Cardiomyopathy   . Cerebrovascular accident ([place])    2012  . Diabetes mellitus   . Hyperlipidemia   . Hypertension   . Left-sided weakness   . 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 Zigmund Daniel, laser therapy OD  . Seizures ([place])    pt reports this is from low blood sugar   Past Surgical History:  Procedure Laterality Date  . Ear surgery as a child    . TONSILLECTOMY     Current Outpatient Prescriptions on File Prior to Visit  Medication Sig Dispense Refill  . amLODipine (NORVASC) 10 MG tablet TAKE 1 TABLET BY MOUTH EVERY DAY 30 tablet 0  . atorvastatin (LIPITOR) 20 MG tablet Take 1 tablet (20 mg total) by mouth daily. 90 tablet 3  . carvedilol (COREG) 25 MG tablet Take 1 tablet (25 mg total) by mouth 2 (two) times daily with a meal. 180 tablet 3  . DUREZOL 0.05 % EMUL INT 1 GTT IN OD BID FOR 2 WKS  2  . hydrALAZINE (APRESOLINE) 50 MG tablet TAKE 1 TABLET BY MOUTH THREE TIMES DAILY. 270 tablet 3  . Insulin Pen Needle 31G X 5 MM MISC Use daily with insulin 100 each 5  . Insulin Syringe-Needle U-100 (INSULIN SYRINGE .3CC/31GX5/16") 31G X 5/16" 0.3 ML MISC Use twice  daily to inject insulin. 100 each 10  . LEVEMIR FLEXTOUCH 100 UNIT/ML Pen INJECT 70 UNITS UNDER THE SKIN EVERY MORNING AND 40 UNITS AT BEDTIME 135 mL 0  . ONE TOUCH ULTRA TEST test strip CHECK BLOOD SUGAR FOUR TIMES DAILY 400 each 3  . warfarin (COUMADIN) 10 MG tablet 10 mg qd except 5 mg F&M 30 tablet 2   No current facility-administered medications on file prior to visit.    Allergies  Allergen Reactions  . Codeine Other (See Comments)    Unknown reaction, severe headach   Social History   Social History  . Marital status: Divorced    Spouse name: N/A  . Number of children: 2  . Years of education: N/A   Occupational History  . Disabled    Social History Main Topics  . Smoking status: Never Smoker  . Smokeless tobacco: Never Used  . Alcohol use 0.0 oz/week     Comment: seldom  . Drug use: Yes    Frequency: 7.0 times per week     Comment: marijauna for arthritis  . Sexual activity: Not on file   Other Topics Concern  . Not on file   Social History Narrative   He has children.  Review of Systems  All other systems reviewed and are negative.      Objective:   Physical Exam  Cardiovascular: Normal rate, regular rhythm and normal heart sounds.   Pulmonary/Chest: Effort normal and breath sounds normal. No respiratory distress. He has no wheezes. He has no rales. He exhibits no tenderness.  Abdominal: Soft. Bowel sounds are normal. He exhibits no distension. There is no tenderness. There is no rebound.  Musculoskeletal: He exhibits no edema.  Vitals reviewed.         Assessment & Plan:  Atrial fibrillation ([place])  Uncontrolled type 2 diabetes mellitus with hyperglycemia, with long-term current use of insulin (HCC)  Pure hypercholesterolemia  Continue 10 mg of Coumadin every day except Monday and Friday. On Mondays and Fridays 5 mg of Coumadin. Recheck INR in 4 weeks.  Check hemoglobin A1c. However sugars sound elevated. I would add metformin 850 mg by  mouth twice a day. His blood pressure today is elevated but he insists that his blood pressure home is 047 to 9:98 systolic over 80. I've asked the patient check his blood pressure frequently at home and notify me if his systolic blood pressure greater than 140. Also check a fasting lipid panel. Goal LDL cholesterol is well below 100. Continue to encourage aerobic exercise

## 2016-05-15 LAB — HEMOGLOBIN A1C
Hgb A1c MFr Bld: 8 % — ABNORMAL HIGH (ref <5.7)
MEAN PLASMA GLUCOSE: 183 mg/dL

## 2016-05-16 ENCOUNTER — Other Ambulatory Visit: Payer: Medicare HMO

## 2016-05-16 DIAGNOSIS — E1165 Type 2 diabetes mellitus with hyperglycemia: Secondary | ICD-10-CM | POA: Diagnosis not present

## 2016-05-16 DIAGNOSIS — Z794 Long term (current) use of insulin: Secondary | ICD-10-CM | POA: Diagnosis not present

## 2016-05-17 LAB — MICROALBUMIN, URINE: MICROALB UR: 2.3 mg/dL

## 2016-05-25 ENCOUNTER — Other Ambulatory Visit: Payer: Self-pay | Admitting: Family Medicine

## 2016-05-25 NOTE — Telephone Encounter (Signed)
Medication refilled per protocol. 

## 2016-06-25 ENCOUNTER — Encounter: Payer: Self-pay | Admitting: Family Medicine

## 2016-06-25 ENCOUNTER — Ambulatory Visit (INDEPENDENT_AMBULATORY_CARE_PROVIDER_SITE_OTHER): Payer: Medicare HMO | Admitting: Family Medicine

## 2016-06-25 VITALS — BP 120/74 | HR 74 | Temp 98.0°F | Resp 18 | Ht 69.0 in | Wt 210.0 lb

## 2016-06-25 DIAGNOSIS — I4891 Unspecified atrial fibrillation: Secondary | ICD-10-CM

## 2016-06-25 DIAGNOSIS — E1165 Type 2 diabetes mellitus with hyperglycemia: Secondary | ICD-10-CM | POA: Diagnosis not present

## 2016-06-25 DIAGNOSIS — Z794 Long term (current) use of insulin: Secondary | ICD-10-CM | POA: Diagnosis not present

## 2016-06-25 LAB — PT WITH INR/FINGERSTICK
INR, fingerstick: 2.7 — ABNORMAL HIGH (ref 0.9–1.1)
PT, fingerstick: 32 seconds — ABNORMAL HIGH (ref 10.5–13.1)

## 2016-06-25 NOTE — Progress Notes (Signed)
Subjective:    Patient ID: Brian [name], male    DOB: 10-20-1955, 61 y.o.   MRN: [id number]  Medication Refill    05/14/16 Takes coumadin 5 mg on Monday and Friday, and 10 mg every other day. Today his INR is 1.8. He denies any bleeding or bruising. He denies any diet changes. He is also due to recheck his hemoglobin A1c. His last hemoglobin A1c was 7.7 in December. His goal hemoglobin A1c is less than 7. He denies any hypoglycemic episodes. However, sugars are 150-220 and usually 150-170. He denies any chest pain shortness of breath or dyspnea on exertion. His blood pressure today is elevated at 150/88. He is also due for fasting lipid panel.  He denies any myalgias, RUQ pain.  He denies polyuria, polydypsia, or blurry vision.  At that time, my plan was: Continue 10 mg of Coumadin every day except Monday and Friday. On Mondays and Fridays 5 mg of Coumadin. Recheck INR in 4 weeks.  Check hemoglobin A1c. However sugars sound elevated. I would add metformin 850 mg by mouth twice a day. His blood pressure today is elevated but he insists that his blood pressure home is 893 to 810 systolic over 80. I've asked the patient check his blood pressure frequently at home and notify me if his systolic blood pressure greater than 140. Also check a fasting lipid panel. Goal LDL cholesterol is well below 100. Continue to encourage aerobic exercise  06/25/16 Recently returned from [place] where he was visiting his son and his new grandson. Did begin metformin prior to leaving. Did experience some diarrhea in [place] but that has improved. He is not checking his fasting or 2 hour postprandial sugars but he denies any hypoglycemic episodes. His blood pressure today is much better than his last visit. His weight is stable and actually down slightly. He is taking Coumadin 10 mg every day and 5 mg on Mondays and Fridays. His INR today is 2.7 and therapeutic. He denies any bleeding or bruising Past Medical History:    Diagnosis Date  . Arthritis   . Atrial fibrillation ([place])   . Cardiomyopathy   . Cerebrovascular accident ([place])    2012  . Diabetes mellitus   . Hyperlipidemia   . Hypertension   . Left-sided weakness   . 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 Zigmund Daniel, laser therapy OD  . Seizures ([place])    pt reports this is from low blood sugar   Past Surgical History:  Procedure Laterality Date  . Ear surgery as a child    . TONSILLECTOMY     Current Outpatient Prescriptions on File Prior to Visit  Medication Sig Dispense Refill  . amLODipine (NORVASC) 10 MG tablet TAKE 1 TABLET BY MOUTH EVERY DAY 90 tablet 1  . atorvastatin (LIPITOR) 20 MG tablet Take 1 tablet (20 mg total) by mouth daily. 90 tablet 3  . carvedilol (COREG) 25 MG tablet Take 1 tablet (25 mg total) by mouth 2 (two) times daily with a meal. 180 tablet 3  . DUREZOL 0.05 % EMUL INT 1 GTT IN OD BID FOR 2 WKS  2  . hydrALAZINE (APRESOLINE) 50 MG tablet TAKE 1 TABLET BY MOUTH THREE TIMES DAILY. 270 tablet 3  . Insulin Pen Needle 31G X 5 MM MISC Use daily with insulin 100 each 5  . Insulin Syringe-Needle U-100 (INSULIN SYRINGE .3CC/31GX5/16") 31G X 5/16" 0.3 ML MISC Use twice daily to inject insulin. 100 each  10  . LEVEMIR FLEXTOUCH 100 UNIT/ML Pen INJECT 70 UNITS UNDER THE SKIN EVERY MORNING AND 40 UNITS AT BEDTIME 135 mL 0  . metFORMIN (GLUCOPHAGE) 850 MG tablet Take 1 tablet (850 mg total) by mouth 2 (two) times daily with a meal. 60 tablet 5  . ONE TOUCH ULTRA TEST test strip CHECK BLOOD SUGAR FOUR TIMES DAILY 400 each 3  . warfarin (COUMADIN) 10 MG tablet 10 mg qd except 5 mg F&M 30 tablet 2   No current facility-administered medications on file prior to visit.    Allergies  Allergen Reactions  . Codeine Other (See Comments)    Unknown reaction, severe headach   Social History   Social History  . Marital status: Divorced    Spouse name: N/A  . Number of children: 2  . Years of education: N/A   Occupational  History  . Disabled    Social History Main Topics  . Smoking status: Never Smoker  . Smokeless tobacco: Never Used  . Alcohol use 0.0 oz/week     Comment: seldom  . Drug use: Yes    Frequency: 7.0 times per week     Comment: marijauna for arthritis  . Sexual activity: Not on file   Other Topics Concern  . Not on file   Social History Narrative   He has children.    Review of Systems  All other systems reviewed and are negative.      Objective:   Physical Exam  Cardiovascular: Normal rate, regular rhythm and normal heart sounds.   Pulmonary/Chest: Effort normal and breath sounds normal. No respiratory distress. He has no wheezes. He has no rales. He exhibits no tenderness.  Abdominal: Soft. Bowel sounds are normal. He exhibits no distension. There is no tenderness. There is no rebound.  Musculoskeletal: He exhibits no edema.  Vitals reviewed.         Assessment & Plan:  Atrial fibrillation, unspecified type ([place]) - Plan: PT with INR/Fingerstick  Uncontrolled type 2 diabetes mellitus with hyperglycemia, with long-term current use of insulin (HCC) - Plan: CBC with Differential/Platelet, COMPLETE METABOLIC PANEL WITH GFR, Lipid panel, Microalbumin, urine, Hemoglobin A1c  Pure hypercholesterolemia - Plan: COMPLETE METABOLIC PANEL WITH GFR, Lipid panel  INR today is therapeutic. He is actually a normal rhythm today. Continue Coumadin at its present dosage. Recheck INR in 6 weeks. Blood pressure today is much better. I have asked the patient to check fasting blood sugars and two-hour postprandial sugars over the next 1-2 weeks and record the values and bring them by for my review. His goal fasting blood sugar is less than 130. His goal two-hour postprandial sugar is under 160. He refuses mealtime insulin

## 2016-06-27 ENCOUNTER — Other Ambulatory Visit: Payer: Self-pay | Admitting: Family Medicine

## 2016-07-18 ENCOUNTER — Other Ambulatory Visit: Payer: Self-pay | Admitting: Family Medicine

## 2016-07-18 MED ORDER — INSULIN DETEMIR 100 UNIT/ML FLEXPEN: PEN_INJECTOR | SUBCUTANEOUS | 2 refills | Status: DC

## 2016-07-18 NOTE — Telephone Encounter (Signed)
[facility] requesting refill on Levemir as he has not gotten from them before - med sent to [facility]

## 2016-07-23 ENCOUNTER — Ambulatory Visit (INDEPENDENT_AMBULATORY_CARE_PROVIDER_SITE_OTHER): Payer: Medicare HMO | Admitting: Ophthalmology

## 2016-07-23 DIAGNOSIS — E11319 Type 2 diabetes mellitus with unspecified diabetic retinopathy without macular edema: Secondary | ICD-10-CM | POA: Diagnosis not present

## 2016-07-23 DIAGNOSIS — H35033 Hypertensive retinopathy, bilateral: Secondary | ICD-10-CM

## 2016-07-23 DIAGNOSIS — H2513 Age-related nuclear cataract, bilateral: Secondary | ICD-10-CM | POA: Diagnosis not present

## 2016-07-23 DIAGNOSIS — E113292 Type 2 diabetes mellitus with mild nonproliferative diabetic retinopathy without macular edema, left eye: Secondary | ICD-10-CM | POA: Diagnosis not present

## 2016-07-23 DIAGNOSIS — H43813 Vitreous degeneration, bilateral: Secondary | ICD-10-CM

## 2016-07-23 DIAGNOSIS — I10 Essential (primary) hypertension: Secondary | ICD-10-CM | POA: Diagnosis not present

## 2016-07-23 DIAGNOSIS — E113591 Type 2 diabetes mellitus with proliferative diabetic retinopathy without macular edema, right eye: Secondary | ICD-10-CM

## 2016-07-23 LAB — HM DIABETES EYE EXAM

## 2016-07-24 ENCOUNTER — Encounter: Payer: Self-pay | Admitting: Family Medicine

## 2016-08-07 ENCOUNTER — Encounter: Payer: Self-pay | Admitting: Family Medicine

## 2016-08-07 ENCOUNTER — Ambulatory Visit (INDEPENDENT_AMBULATORY_CARE_PROVIDER_SITE_OTHER): Payer: Medicare HMO | Admitting: Family Medicine

## 2016-08-07 DIAGNOSIS — I4891 Unspecified atrial fibrillation: Secondary | ICD-10-CM

## 2016-08-07 LAB — PT WITH INR/FINGERSTICK
INR, fingerstick: 1.3 — ABNORMAL HIGH (ref 0.9–1.1)
PT FINGERSTICK: 15.3 s — AB (ref 10.5–13.1)

## 2016-08-07 NOTE — Progress Notes (Signed)
Subjective:    Patient ID: Brian [name], male    DOB: 1956-02-17, 61 y.o.   MRN: [id number]  Medication Refill    05/14/16 Takes coumadin 5 mg on Monday and Friday, and 10 mg every other day. Today his INR is 1.8. He denies any bleeding or bruising. He denies any diet changes. He is also due to recheck his hemoglobin A1c. His last hemoglobin A1c was 7.7 in December. His goal hemoglobin A1c is less than 7. He denies any hypoglycemic episodes. However, sugars are 150-220 and usually 150-170. He denies any chest pain shortness of breath or dyspnea on exertion. His blood pressure today is elevated at 150/88. He is also due for fasting lipid panel.  He denies any myalgias, RUQ pain.  He denies polyuria, polydypsia, or blurry vision.  At that time, my plan was: Continue 10 mg of Coumadin every day except Monday and Friday. On Mondays and Fridays 5 mg of Coumadin. Recheck INR in 4 weeks.  Check hemoglobin A1c. However sugars sound elevated. I would add metformin 850 mg by mouth twice a day. His blood pressure today is elevated but he insists that his blood pressure home is 622 to 633 systolic over 80. I've asked the patient check his blood pressure frequently at home and notify me if his systolic blood pressure greater than 140. Also check a fasting lipid panel. Goal LDL cholesterol is well below 100. Continue to encourage aerobic exercise  06/25/16 Recently returned from [place] where he was visiting his son and his new grandson. Did begin metformin prior to leaving. Did experience some diarrhea in [place] but that has improved. He is not checking his fasting or 2 hour postprandial sugars but he denies any hypoglycemic episodes. His blood pressure today is much better than his last visit. His weight is stable and actually down slightly. He is taking Coumadin 10 mg every day and 5 mg on Mondays and Fridays. His INR today is 2.7 and therapeutic. He denies any bleeding or bruising.  At that time, my plan  was: INR today is therapeutic. He is actually a normal rhythm today. Continue Coumadin at its present dosage. Recheck INR in 6 weeks. Blood pressure today is much better. I have asked the patient to check fasting blood sugars and two-hour postprandial sugars over the next 1-2 weeks and record the values and bring them by for my review. His goal fasting blood sugar is less than 130. His goal two-hour postprandial sugar is under 160. He refuses mealtime insulin   08/07/16 INR today is subtherapeutic at 1.3. Patient is taking 10 mg every day except Monday when he takes 5 mg and Friday when he takes 5 mg. He is adamant that he has not missed any doses of Coumadin. He has lost 12 pounds intentionally through diet and exercise and caloric restriction which I congratulated the patient on. He is doing this try to get his sugars under better control Past Medical History:  Diagnosis Date  . Arthritis   . Atrial fibrillation ([place])   . Cardiomyopathy   . Cerebrovascular accident ([place])    2012  . Diabetes mellitus   . Hyperlipidemia   . Hypertension   . Left-sided weakness   . 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 Zigmund Daniel, laser therapy OD  . Seizures ([place])    pt reports this is from low blood sugar   Past Surgical History:  Procedure Laterality Date  . Ear surgery as a  child    . TONSILLECTOMY     Current Outpatient Prescriptions on File Prior to Visit  Medication Sig Dispense Refill  . amLODipine (NORVASC) 10 MG tablet TAKE 1 TABLET BY MOUTH EVERY DAY 90 tablet 1  . atorvastatin (LIPITOR) 20 MG tablet Take 1 tablet (20 mg total) by mouth daily. 90 tablet 3  . carvedilol (COREG) 25 MG tablet Take 1 tablet (25 mg total) by mouth 2 (two) times daily with a meal. 180 tablet 3  . hydrALAZINE (APRESOLINE) 50 MG tablet TAKE 1 TABLET BY MOUTH THREE TIMES DAILY. 270 tablet 3  . Insulin Detemir (LEVEMIR FLEXTOUCH) 100 UNIT/ML Pen INJECT 70 UNITS UNDER THE SKIN EVERY MORNING AND 40 UNITS AT  BEDTIME 135 mL 2  . Insulin Pen Needle 31G X 5 MM MISC Use daily with insulin 100 each 5  . Insulin Syringe-Needle U-100 (INSULIN SYRINGE .3CC/31GX5/16") 31G X 5/16" 0.3 ML MISC Use twice daily to inject insulin. 100 each 10  . metFORMIN (GLUCOPHAGE) 850 MG tablet Take 1 tablet (850 mg total) by mouth 2 (two) times daily with a meal. 60 tablet 5  . ONE TOUCH ULTRA TEST test strip CHECK BLOOD SUGAR FOUR TIMES DAILY 400 each 3  . warfarin (COUMADIN) 10 MG tablet TAKE 1 TABLET BY MOUTH EVERY DAY EXCEPT 1/2 TAB ON FRIDAYS AND MONDAYS 30 tablet 2   No current facility-administered medications on file prior to visit.    Allergies  Allergen Reactions  . Codeine Other (See Comments)    Unknown reaction, severe headach   Social History   Social History  . Marital status: Divorced    Spouse name: N/A  . Number of children: 2  . Years of education: N/A   Occupational History  . Disabled    Social History Main Topics  . Smoking status: Never Smoker  . Smokeless tobacco: Never Used  . Alcohol use 0.0 oz/week     Comment: seldom  . Drug use: Yes    Frequency: 7.0 times per week     Comment: marijauna for arthritis  . Sexual activity: Not on file   Other Topics Concern  . Not on file   Social History Narrative   He has children.    Review of Systems  All other systems reviewed and are negative.      Objective:   Physical Exam  Cardiovascular: Normal rate, regular rhythm and normal heart sounds.   Pulmonary/Chest: Effort normal and breath sounds normal. No respiratory distress. He has no wheezes. He has no rales. He exhibits no tenderness.  Abdominal: Soft. Bowel sounds are normal. He exhibits no distension. There is no tenderness. There is no rebound.  Musculoskeletal: He exhibits no edema.  Vitals reviewed.         Assessment & Plan:  Atrial fibrillation ([place])  Today's INR was confirmed by the lab with the second draw. He is subtherapeutic. Increase Coumadin to 10 mg  every day and recheck INR in 2 weeks. Previous INR on the exact same dose was 2.7. Therefore I hesitate to make drastic changes until after his next visit

## 2016-08-21 ENCOUNTER — Ambulatory Visit (INDEPENDENT_AMBULATORY_CARE_PROVIDER_SITE_OTHER): Payer: Medicare HMO | Admitting: Family Medicine

## 2016-08-21 ENCOUNTER — Encounter: Payer: Self-pay | Admitting: Family Medicine

## 2016-08-21 DIAGNOSIS — I4891 Unspecified atrial fibrillation: Secondary | ICD-10-CM | POA: Diagnosis not present

## 2016-08-21 LAB — PT WITH INR/FINGERSTICK
INR FINGERSTICK: 2.4 — AB (ref 0.9–1.1)
PT, fingerstick: 28.8 seconds — ABNORMAL HIGH (ref 10.5–13.1)

## 2016-08-21 NOTE — Progress Notes (Signed)
Subjective:    Patient ID: Brian [name], male    DOB: 1955/02/26, 61 y.o.   MRN: [id number]  Medication Refill    05/14/16 Takes coumadin 5 mg on Monday and Friday, and 10 mg every other day. Today his INR is 1.8. He denies any bleeding or bruising. He denies any diet changes. He is also due to recheck his hemoglobin A1c. His last hemoglobin A1c was 7.7 in December. His goal hemoglobin A1c is less than 7. He denies any hypoglycemic episodes. However, sugars are 150-220 and usually 150-170. He denies any chest pain shortness of breath or dyspnea on exertion. His blood pressure today is elevated at 150/88. He is also due for fasting lipid panel.  He denies any myalgias, RUQ pain.  He denies polyuria, polydypsia, or blurry vision.  At that time, my plan was: Continue 10 mg of Coumadin every day except Monday and Friday. On Mondays and Fridays 5 mg of Coumadin. Recheck INR in 4 weeks.  Check hemoglobin A1c. However sugars sound elevated. I would add metformin 850 mg by mouth twice a day. His blood pressure today is elevated but he insists that his blood pressure home is 233 to 007 systolic over 80. I've asked the patient check his blood pressure frequently at home and notify me if his systolic blood pressure greater than 140. Also check a fasting lipid panel. Goal LDL cholesterol is well below 100. Continue to encourage aerobic exercise  06/25/16 Recently returned from [place] where he was visiting his son and his new grandson. Did begin metformin prior to leaving. Did experience some diarrhea in [place] but that has improved. He is not checking his fasting or 2 hour postprandial sugars but he denies any hypoglycemic episodes. His blood pressure today is much better than his last visit. His weight is stable and actually down slightly. He is taking Coumadin 10 mg every day and 5 mg on Mondays and Fridays. His INR today is 2.7 and therapeutic. He denies any bleeding or bruising.  At that time, my plan  was: INR today is therapeutic. He is actually a normal rhythm today. Continue Coumadin at its present dosage. Recheck INR in 6 weeks. Blood pressure today is much better. I have asked the patient to check fasting blood sugars and two-hour postprandial sugars over the next 1-2 weeks and record the values and bring them by for my review. His goal fasting blood sugar is less than 130. His goal two-hour postprandial sugar is under 160. He refuses mealtime insulin   08/07/16 INR today is subtherapeutic at 1.3. Patient is taking 10 mg every day except Monday when he takes 5 mg and Friday when he takes 5 mg. He is adamant that he has not missed any doses of Coumadin. He has lost 12 pounds intentionally through diet and exercise and caloric restriction which I congratulated the patient on. He is doing this try to get his sugars under better control. AT that time, my plan was: Today's INR was confirmed by the lab with the second draw. He is subtherapeutic. Increase Coumadin to 10 mg every day and recheck INR in 2 weeks. Previous INR on the exact same dose was 2.7. Therefore I hesitate to make drastic changes until after his next visit  08/21/16 The patient's INR today is therapeutic at 2.4. He is taking 10 mg every day. He denies any bleeding or bruising. He denies any complications. Past Medical History:  Diagnosis Date  . Arthritis   .  Atrial fibrillation ([place])   . Cardiomyopathy   . Cerebrovascular accident ([place])    2012  . Diabetes mellitus   . Hyperlipidemia   . Hypertension   . Left-sided weakness   . 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 Zigmund Daniel, laser therapy OD  . Seizures ([place])    pt reports this is from low blood sugar   Past Surgical History:  Procedure Laterality Date  . Ear surgery as a child    . TONSILLECTOMY     Current Outpatient Prescriptions on File Prior to Visit  Medication Sig Dispense Refill  . amLODipine (NORVASC) 10 MG tablet TAKE 1 TABLET BY MOUTH EVERY DAY  90 tablet 1  . atorvastatin (LIPITOR) 20 MG tablet Take 1 tablet (20 mg total) by mouth daily. 90 tablet 3  . carvedilol (COREG) 25 MG tablet Take 1 tablet (25 mg total) by mouth 2 (two) times daily with a meal. 180 tablet 3  . hydrALAZINE (APRESOLINE) 50 MG tablet TAKE 1 TABLET BY MOUTH THREE TIMES DAILY. 270 tablet 3  . Insulin Detemir (LEVEMIR FLEXTOUCH) 100 UNIT/ML Pen INJECT 70 UNITS UNDER THE SKIN EVERY MORNING AND 40 UNITS AT BEDTIME 135 mL 2  . Insulin Pen Needle 31G X 5 MM MISC Use daily with insulin 100 each 5  . Insulin Syringe-Needle U-100 (INSULIN SYRINGE .3CC/31GX5/16") 31G X 5/16" 0.3 ML MISC Use twice daily to inject insulin. 100 each 10  . metFORMIN (GLUCOPHAGE) 850 MG tablet Take 1 tablet (850 mg total) by mouth 2 (two) times daily with a meal. 60 tablet 5  . ONE TOUCH ULTRA TEST test strip CHECK BLOOD SUGAR FOUR TIMES DAILY 400 each 3  . warfarin (COUMADIN) 10 MG tablet TAKE 1 TABLET BY MOUTH EVERY DAY EXCEPT 1/2 TAB ON FRIDAYS AND MONDAYS (Patient taking differently: TAKE 1 TABLET BY MOUTH EVERY DAY) 30 tablet 2   No current facility-administered medications on file prior to visit.    Allergies  Allergen Reactions  . Codeine Other (See Comments)    Unknown reaction, severe headach   Social History   Social History  . Marital status: Divorced    Spouse name: N/A  . Number of children: 2  . Years of education: N/A   Occupational History  . Disabled    Social History Main Topics  . Smoking status: Never Smoker  . Smokeless tobacco: Never Used  . Alcohol use 0.0 oz/week     Comment: seldom  . Drug use: Yes    Frequency: 7.0 times per week     Comment: marijauna for arthritis  . Sexual activity: Not on file   Other Topics Concern  . Not on file   Social History Narrative   He has children.    Review of Systems  All other systems reviewed and are negative.      Objective:   Physical Exam  Cardiovascular: Normal rate, regular rhythm and normal heart  sounds.   Pulmonary/Chest: Effort normal and breath sounds normal. No respiratory distress. He has no wheezes. He has no rales. He exhibits no tenderness.  Abdominal: Soft. Bowel sounds are normal. He exhibits no distension. There is no tenderness. There is no rebound.  Musculoskeletal: He exhibits no edema.  Vitals reviewed.         Assessment & Plan:  Atrial fibrillation (HCC)  INR is therapeutic. Recheck INR in 4 weeks. Continue Coumadin 10 mg a day

## 2016-09-25 ENCOUNTER — Ambulatory Visit (INDEPENDENT_AMBULATORY_CARE_PROVIDER_SITE_OTHER): Payer: Medicare HMO | Admitting: Family Medicine

## 2016-09-25 ENCOUNTER — Encounter: Payer: Self-pay | Admitting: Family Medicine

## 2016-09-25 VITALS — BP 118/74 | HR 78 | Temp 98.7°F | Resp 14 | Ht 69.0 in | Wt 203.0 lb

## 2016-09-25 DIAGNOSIS — E119 Type 2 diabetes mellitus without complications: Secondary | ICD-10-CM

## 2016-09-25 DIAGNOSIS — Z794 Long term (current) use of insulin: Secondary | ICD-10-CM

## 2016-09-25 DIAGNOSIS — E782 Mixed hyperlipidemia: Secondary | ICD-10-CM | POA: Diagnosis not present

## 2016-09-25 DIAGNOSIS — I10 Essential (primary) hypertension: Secondary | ICD-10-CM | POA: Diagnosis not present

## 2016-09-25 DIAGNOSIS — I4891 Unspecified atrial fibrillation: Secondary | ICD-10-CM | POA: Diagnosis not present

## 2016-09-25 LAB — LIPID PANEL
CHOL/HDL RATIO: 4.5 ratio (ref <5.0)
CHOLESTEROL: 154 mg/dL (ref <200)
HDL: 34 mg/dL — ABNORMAL LOW (ref >40)
LDL Cholesterol: 83 mg/dL (ref <100)
TRIGLYCERIDES: 183 mg/dL — AB (ref <150)
VLDL: 37 mg/dL — AB (ref <30)

## 2016-09-25 LAB — COMPREHENSIVE METABOLIC PANEL
ALBUMIN: 4.2 g/dL (ref 3.6–5.1)
ALT: 22 U/L (ref 9–46)
AST: 16 U/L (ref 10–35)
Alkaline Phosphatase: 62 U/L (ref 40–115)
BILIRUBIN TOTAL: 0.5 mg/dL (ref 0.2–1.2)
BUN: 20 mg/dL (ref 7–25)
CO2: 21 mmol/L (ref 20–32)
CREATININE: 1.29 mg/dL — AB (ref 0.70–1.25)
Calcium: 9.2 mg/dL (ref 8.6–10.3)
Chloride: 103 mmol/L (ref 98–110)
Glucose, Bld: 163 mg/dL — ABNORMAL HIGH (ref 70–99)
Potassium: 4 mmol/L (ref 3.5–5.3)
SODIUM: 137 mmol/L (ref 135–146)
TOTAL PROTEIN: 6.7 g/dL (ref 6.1–8.1)

## 2016-09-25 LAB — CBC WITH DIFFERENTIAL/PLATELET
BASOS ABS: 0 {cells}/uL (ref 0–200)
Basophils Relative: 0 %
EOS ABS: 142 {cells}/uL (ref 15–500)
Eosinophils Relative: 2 %
HCT: 43.7 % (ref 38.5–50.0)
Hemoglobin: 14.5 g/dL (ref 13.0–17.0)
LYMPHS PCT: 24 %
Lymphs Abs: 1704 cells/uL (ref 850–3900)
MCH: 28.5 pg (ref 27.0–33.0)
MCHC: 33.2 g/dL (ref 32.0–36.0)
MCV: 86 fL (ref 80.0–100.0)
MONOS PCT: 9 %
MPV: 8.9 fL (ref 7.5–12.5)
Monocytes Absolute: 639 cells/uL (ref 200–950)
Neutro Abs: 4615 cells/uL (ref 1500–7800)
Neutrophils Relative %: 65 %
PLATELETS: 213 10*3/uL (ref 140–400)
RBC: 5.08 MIL/uL (ref 4.20–5.80)
RDW: 14.6 % (ref 11.0–15.0)
WBC: 7.1 10*3/uL (ref 3.8–10.8)

## 2016-09-25 LAB — PT WITH INR/FINGERSTICK
INR, fingerstick: 2.2 — ABNORMAL HIGH (ref 0.9–1.1)
PT, fingerstick: 27 seconds — ABNORMAL HIGH (ref 10.5–13.1)

## 2016-09-25 NOTE — Assessment & Plan Note (Signed)
He is improving his weight his goals to lose another 25 pounds he would like to come off of this insulin. Will discuss with his PCP that he does not take the 40 units at bedtime regularly so seems like his regimen is actually just Levemir 70 units in the morning along with metformin. Of note he does follow with a nephrologist there for a few minutes the reason why he is not on an ACE inhibitor.  Goal his A1c less than 7%

## 2016-09-25 NOTE — Assessment & Plan Note (Signed)
Blood pressure is controlled no change in medications.

## 2016-09-25 NOTE — Patient Instructions (Addendum)
F/U 1 month Dr. Dennard Schaumann Coumadin Check

## 2016-09-25 NOTE — Assessment & Plan Note (Signed)
Sinus rhythm INR is at goal at 2.2 today and he Coumadin 10 mg daily follow-up 1 month with PCP

## 2016-09-25 NOTE — Progress Notes (Signed)
   Subjective:    Patient ID: Brian [name], male    DOB: 16-Oct-1955, 61 y.o.   MRN: [id number]  Patient presents for Follow-up (is fasting)  Pt here for coumadin check , currently on Coumadin 10mg  daily. Last INR was 2.4 , no abnormal bleeding noted    DM- last A1C  8%, currently on Metformin 850mg  BID, Levemir 70 units in AM and 40 units at bedtime  ( If needed )  he has been working on dietary changes and weight loss   CBG has been down to 90, highest 140's, no hypoglycemia recdntly, keeps glucose tablets   Hypertriglyceridemia- TG were 297  Which was significant improvement from 1239  1 year ago,   currently on lipitor   HTN in setting of sytolic CHF and A fib history- taking norvasc and Coreg, hydralzaine, not on ACEI       Review Of Systems:  GEN- denies fatigue, fever, weight loss,weakness, recent illness HEENT- denies eye drainage, change in vision, nasal discharge, CVS- denies chest pain, palpitations RESP- denies SOB, cough, wheeze ABD- denies N/V, change in stools, abd pain GU- denies dysuria, hematuria, dribbling, incontinence MSK- denies joint pain, muscle aches, injury Neuro- denies headache, dizziness, syncope, seizure activity       Objective:    BP 118/74   Pulse 78   Temp 98.7 F (37.1 C) (Oral)   Resp 14   Ht 5\' 9"  (1.753 m)   Wt 203 lb (92.1 kg)   SpO2 99%   BMI 29.98 kg/m  GEN- NAD, alert and oriented x3 HEENT- PERRL, EOMI, non injected sclera, pink conjunctiva, MMM, oropharynx clear CVS- RRR, no murmur RESP-CTAB EXT- No edema Pulses- Radial, DP- 2+        Assessment & Plan:      Problem List Items Addressed This Visit    Hyperlipidemia - Primary   Relevant Orders   Lipid panel   Essential hypertension, benign    Blood pressure is controlled no change in medications.      Relevant Orders   CBC with Differential/Platelet   Diabetes mellitus, type II ([place])    He is improving his weight his goals to lose another 25 pounds he  would like to come off of this insulin. Will discuss with his PCP that he does not take the 40 units at bedtime regularly so seems like his regimen is actually just Levemir 70 units in the morning along with metformin. Of note he does follow with a nephrologist there for a few minutes the reason why he is not on an ACE inhibitor.  Goal his A1c less than 7%      Relevant Orders   Comprehensive metabolic panel   Hemoglobin A1c   Lipid panel   CBC with Differential/Platelet   Atrial fibrillation (HCC)    Sinus rhythm INR is at goal at 2.2 today and he Coumadin 10 mg daily follow-up 1 month with PCP         Note: This dictation was prepared with Dragon dictation along with smaller phrase technology. Any transcriptional errors that result from this process are unintentional.

## 2016-09-26 LAB — HEMOGLOBIN A1C
Hgb A1c MFr Bld: 6.6 % — ABNORMAL HIGH (ref <5.7)
MEAN PLASMA GLUCOSE: 143 mg/dL

## 2016-09-27 ENCOUNTER — Encounter: Payer: Self-pay | Admitting: *Deleted

## 2016-10-06 ENCOUNTER — Other Ambulatory Visit: Payer: Self-pay | Admitting: Family Medicine

## 2016-11-06 ENCOUNTER — Ambulatory Visit (INDEPENDENT_AMBULATORY_CARE_PROVIDER_SITE_OTHER): Payer: Medicare HMO | Admitting: Family Medicine

## 2016-11-06 ENCOUNTER — Encounter: Payer: Self-pay | Admitting: Family Medicine

## 2016-11-06 VITALS — BP 150/90 | HR 72 | Temp 98.2°F | Resp 16 | Ht 69.0 in | Wt 200.0 lb

## 2016-11-06 DIAGNOSIS — E119 Type 2 diabetes mellitus without complications: Secondary | ICD-10-CM | POA: Diagnosis not present

## 2016-11-06 DIAGNOSIS — I4891 Unspecified atrial fibrillation: Secondary | ICD-10-CM | POA: Diagnosis not present

## 2016-11-06 DIAGNOSIS — E782 Mixed hyperlipidemia: Secondary | ICD-10-CM | POA: Diagnosis not present

## 2016-11-06 DIAGNOSIS — Z23 Encounter for immunization: Secondary | ICD-10-CM | POA: Diagnosis not present

## 2016-11-06 DIAGNOSIS — I10 Essential (primary) hypertension: Secondary | ICD-10-CM

## 2016-11-06 DIAGNOSIS — Z794 Long term (current) use of insulin: Secondary | ICD-10-CM | POA: Diagnosis not present

## 2016-11-06 LAB — PT WITH INR/FINGERSTICK
INR, fingerstick: 2.4 ratio — ABNORMAL HIGH
PT, fingerstick: 28.5 s — ABNORMAL HIGH (ref 10.5–13.1)

## 2016-11-06 NOTE — Progress Notes (Signed)
Subjective:    Patient ID: Brian [name], male    DOB: 1955/05/09, 61 y.o.   MRN: [id number]  Medication Refill    The patient's INR today is therapeutic at 2.4. He is taking 10 mg every day. He denies any bleeding or bruising. He denies any complications.  At his last office visit, the patient had his hemoglobin A1c tested. His hemoglobin A1c was outstanding at 6.6. I congratulated the patient. He denies any episodes of hypoglycemia.  He states he is checking his blood pressure at home regularly. It is consistently between 130 and 140/80. He is due today for a flu shot. He denies any chest pain shortness of breath or dyspnea on exertion Past Medical History:  Diagnosis Date  . Arthritis   . Atrial fibrillation ([place])   . Cardiomyopathy   . Cerebrovascular accident ([place])    2012  . Diabetes mellitus   . Hyperlipidemia   . Hypertension   . Left-sided weakness   . 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 Zigmund Daniel, laser therapy OD  . Seizures ([place])    pt reports this is from low blood sugar   Past Surgical History:  Procedure Laterality Date  . Ear surgery as a child    . TONSILLECTOMY     Current Outpatient Prescriptions on File Prior to Visit  Medication Sig Dispense Refill  . amLODipine (NORVASC) 10 MG tablet TAKE 1 TABLET BY MOUTH EVERY DAY 90 tablet 1  . atorvastatin (LIPITOR) 20 MG tablet Take 1 tablet (20 mg total) by mouth daily. 90 tablet 3  . carvedilol (COREG) 25 MG tablet Take 1 tablet (25 mg total) by mouth 2 (two) times daily with a meal. 180 tablet 3  . hydrALAZINE (APRESOLINE) 50 MG tablet TAKE 1 TABLET BY MOUTH THREE TIMES DAILY. 270 tablet 3  . Insulin Detemir (LEVEMIR FLEXTOUCH) 100 UNIT/ML Pen INJECT 70 UNITS UNDER THE SKIN EVERY MORNING AND 40 UNITS AT BEDTIME 135 mL 2  . Insulin Pen Needle 31G X 5 MM MISC Use daily with insulin 100 each 5  . Insulin Syringe-Needle U-100 (INSULIN SYRINGE .3CC/31GX5/16") 31G X 5/16" 0.3 ML MISC Use twice daily to  inject insulin. 100 each 10  . metFORMIN (GLUCOPHAGE) 850 MG tablet Take 1 tablet (850 mg total) by mouth 2 (two) times daily with a meal. 60 tablet 5  . ONE TOUCH ULTRA TEST test strip CHECK BLOOD SUGAR FOUR TIMES DAILY 400 each 3  . warfarin (COUMADIN) 10 MG tablet TAKE 1 TABLET BY MOUTH EVERY DAY EXCEPT 1/2 TAB ON FRIDAYS AND MONDAYS (Patient taking differently: TAKE 1 TABLET BY MOUTH EVERY DAY) 30 tablet 11   No current facility-administered medications on file prior to visit.    Allergies  Allergen Reactions  . Codeine Other (See Comments)    Unknown reaction, severe headach   Social History   Social History  . Marital status: Divorced    Spouse name: N/A  . Number of children: 2  . Years of education: N/A   Occupational History  . Disabled    Social History Main Topics  . Smoking status: Never Smoker  . Smokeless tobacco: Never Used  . Alcohol use 0.0 oz/week     Comment: seldom  . Drug use: Yes    Frequency: 7.0 times per week     Comment: marijauna for arthritis  . Sexual activity: Not on file   Other Topics Concern  . Not on file  Social History Narrative   He has children.    Review of Systems  All other systems reviewed and are negative.      Objective:   Physical Exam  Cardiovascular: Normal rate, regular rhythm and normal heart sounds.   Pulmonary/Chest: Effort normal and breath sounds normal. No respiratory distress. He has no wheezes. He has no rales. He exhibits no tenderness.  Abdominal: Soft. Bowel sounds are normal. He exhibits no distension. There is no tenderness. There is no rebound.  Musculoskeletal: He exhibits no edema.  Vitals reviewed.         Assessment & Plan:  Atrial fibrillation, unspecified type ([place]) - Plan: PT with INR/Fingerstick IDDM HTN HLD  INR is therapeutic. Recheck INR in 4 weeks. Continue Coumadin 10 mg a day.  Hemoglobin A1c is outstanding. Continue his current insulin dose. If he continues to lose weight,  we will need to reduce his basal insulin. His LDL cholesterol was less than 183. I believe this is acceptable. Continue to encourage exercise and weight loss. Patient received his flu shot today

## 2016-11-21 ENCOUNTER — Other Ambulatory Visit: Payer: Self-pay

## 2016-11-21 MED ORDER — METFORMIN HCL 850 MG PO TABS: 850.0000 mg | ORAL_TABLET | Freq: Two times a day (BID) | ORAL | 5 refills | Status: DC

## 2016-11-21 NOTE — Telephone Encounter (Signed)
rx filled per protocol  

## 2016-11-23 ENCOUNTER — Other Ambulatory Visit: Payer: Self-pay

## 2016-11-23 MED ORDER — METFORMIN HCL 850 MG PO TABS: 850.0000 mg | ORAL_TABLET | Freq: Two times a day (BID) | ORAL | 5 refills | Status: DC

## 2016-12-05 ENCOUNTER — Other Ambulatory Visit: Payer: Self-pay | Admitting: Family Medicine

## 2016-12-05 NOTE — Telephone Encounter (Signed)
Medication refilled per protocol. 

## 2016-12-18 ENCOUNTER — Ambulatory Visit (INDEPENDENT_AMBULATORY_CARE_PROVIDER_SITE_OTHER): Payer: Medicare HMO | Admitting: Family Medicine

## 2016-12-18 ENCOUNTER — Encounter: Payer: Self-pay | Admitting: Family Medicine

## 2016-12-18 VITALS — BP 150/80 | HR 70 | Temp 97.8°F | Resp 16 | Ht 69.0 in | Wt 204.0 lb

## 2016-12-18 DIAGNOSIS — I4891 Unspecified atrial fibrillation: Secondary | ICD-10-CM

## 2016-12-18 LAB — PT WITH INR/FINGERSTICK
INR, fingerstick: 2.7 ratio — ABNORMAL HIGH
PT, fingerstick: 32.6 s — ABNORMAL HIGH (ref 10.5–13.1)

## 2016-12-18 MED ORDER — KETOCONAZOLE 2 % EX SHAM: 1.0000 "application " | MEDICATED_SHAMPOO | CUTANEOUS | 11 refills | Status: DC

## 2016-12-18 NOTE — Progress Notes (Signed)
Subjective:    Patient ID: Brian [name], male    DOB: 12/18/1955, 61 y.o.   MRN: [id number]  Medication Refill    The patient's INR today is therapeutic at 2.7. He is taking 10 mg every day. He denies any bleeding or bruising. He denies any complications.   Past Medical History:  Diagnosis Date  . Arthritis   . Atrial fibrillation ([place])   . Cardiomyopathy   . Cerebrovascular accident ([place])    2012  . Diabetes mellitus   . Hyperlipidemia   . Hypertension   . Left-sided weakness   . 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 Zigmund Daniel, laser therapy OD  . Seizures ([place])    pt reports this is from low blood sugar   Past Surgical History:  Procedure Laterality Date  . Ear surgery as a child    . TONSILLECTOMY     Current Outpatient Prescriptions on File Prior to Visit  Medication Sig Dispense Refill  . amLODipine (NORVASC) 10 MG tablet TAKE 1 TABLET BY MOUTH EVERY DAY 90 tablet 1  . atorvastatin (LIPITOR) 20 MG tablet Take 1 tablet (20 mg total) by mouth daily. 90 tablet 3  . carvedilol (COREG) 25 MG tablet Take 1 tablet (25 mg total) by mouth 2 (two) times daily with a meal. 180 tablet 3  . hydrALAZINE (APRESOLINE) 50 MG tablet TAKE 1 TABLET BY MOUTH THREE TIMES DAILY. 270 tablet 3  . Insulin Detemir (LEVEMIR FLEXTOUCH) 100 UNIT/ML Pen INJECT 70 UNITS UNDER THE SKIN EVERY MORNING AND 40 UNITS AT BEDTIME 135 mL 2  . Insulin Pen Needle 31G X 5 MM MISC Use daily with insulin 100 each 5  . Insulin Syringe-Needle U-100 (INSULIN SYRINGE .3CC/31GX5/16") 31G X 5/16" 0.3 ML MISC Use twice daily to inject insulin. 100 each 10  . metFORMIN (GLUCOPHAGE) 850 MG tablet Take 1 tablet (850 mg total) by mouth 2 (two) times daily with a meal. 180 tablet 5  . ONE TOUCH ULTRA TEST test strip CHECK BLOOD SUGAR FOUR TIMES DAILY 400 each 3  . warfarin (COUMADIN) 10 MG tablet TAKE 1 TABLET BY MOUTH EVERY DAY EXCEPT 1/2 TAB ON FRIDAYS AND MONDAYS (Patient taking differently: TAKE 1 TABLET BY  MOUTH EVERY DAY) 30 tablet 11   No current facility-administered medications on file prior to visit.    Allergies  Allergen Reactions  . Codeine Other (See Comments)    Unknown reaction, severe headach   Social History   Social History  . Marital status: Divorced    Spouse name: N/A  . Number of children: 2  . Years of education: N/A   Occupational History  . Disabled    Social History Main Topics  . Smoking status: Never Smoker  . Smokeless tobacco: Never Used  . Alcohol use 0.0 oz/week     Comment: seldom  . Drug use: Yes    Frequency: 7.0 times per week     Comment: marijauna for arthritis  . Sexual activity: Not on file   Other Topics Concern  . Not on file   Social History Narrative   He has children.    Review of Systems  All other systems reviewed and are negative.      Objective:   Physical Exam  Cardiovascular: Normal rate, regular rhythm and normal heart sounds.   Pulmonary/Chest: Effort normal and breath sounds normal. No respiratory distress. He has no wheezes. He has no rales. He exhibits no tenderness.  Abdominal: Soft. Bowel sounds are normal. He exhibits no distension. There is no tenderness. There is no rebound.  Musculoskeletal: He exhibits no edema.  Vitals reviewed.         Assessment & Plan:  Atrial fibrillation, unspecified type ([place]) - Plan: PT with INR/Fingerstick, PT with INR/Fingerstick  INR is therapeutic. Recheck INR in 4 weeks. Continue Coumadin 10 mg a day.  Also gave the patient a prescription for Nizoral shampoo twice a week to treat seborrheic dermatitis to treat seborrheic derm on nasolabial folds, eyebrows, and mustache borders.

## 2016-12-27 ENCOUNTER — Other Ambulatory Visit: Payer: Self-pay | Admitting: Family Medicine

## 2016-12-27 MED ORDER — ATORVASTATIN CALCIUM 20 MG PO TABS: 20.0000 mg | ORAL_TABLET | Freq: Every day | ORAL | 3 refills | Status: DC

## 2017-01-01 ENCOUNTER — Other Ambulatory Visit: Payer: Self-pay | Admitting: Family Medicine

## 2017-01-01 MED ORDER — ATORVASTATIN CALCIUM 20 MG PO TABS: 20.0000 mg | ORAL_TABLET | Freq: Every day | ORAL | 3 refills | Status: DC

## 2017-01-28 ENCOUNTER — Ambulatory Visit (INDEPENDENT_AMBULATORY_CARE_PROVIDER_SITE_OTHER): Payer: Medicare HMO | Admitting: Ophthalmology

## 2017-01-29 ENCOUNTER — Ambulatory Visit: Payer: Medicare HMO | Admitting: Family Medicine

## 2017-02-26 ENCOUNTER — Ambulatory Visit: Payer: Medicare HMO | Admitting: Family Medicine

## 2017-03-05 ENCOUNTER — Other Ambulatory Visit: Payer: Self-pay | Admitting: Family Medicine

## 2017-03-08 ENCOUNTER — Ambulatory Visit (INDEPENDENT_AMBULATORY_CARE_PROVIDER_SITE_OTHER): Payer: Medicare HMO | Admitting: Family Medicine

## 2017-03-08 ENCOUNTER — Encounter: Payer: Self-pay | Admitting: Family Medicine

## 2017-03-08 VITALS — BP 130/80 | HR 84 | Temp 98.3°F | Resp 18 | Ht 69.0 in | Wt 191.0 lb

## 2017-03-08 DIAGNOSIS — R634 Abnormal weight loss: Secondary | ICD-10-CM

## 2017-03-08 DIAGNOSIS — D239 Other benign neoplasm of skin, unspecified: Secondary | ICD-10-CM | POA: Diagnosis not present

## 2017-03-08 DIAGNOSIS — I4891 Unspecified atrial fibrillation: Secondary | ICD-10-CM

## 2017-03-08 LAB — PT WITH INR/FINGERSTICK
INR, fingerstick: 2.2 ratio — ABNORMAL HIGH
PT, fingerstick: 26.4 s — ABNORMAL HIGH (ref 10.5–13.1)

## 2017-03-08 NOTE — Progress Notes (Signed)
Subjective:    Patient ID: Brian [name], male    DOB: Mar 01, 1955, 62 y.o.   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 He would like this removed. Papilloma is approximately 12 mm in diameter. It has a pedunculated base. It is fleshy and skin colored.  He is also due to recheck his INR for his anticoagulation for atrial fibrillation. He has been out of the state for the last month visiting his son in [place]. While there, he contracted a viral illness with diarrhea that lasted 2 weeks. He is unintentionally lost 10 pounds since I last saw him.  Patient appears tired today. He also appears slightly disheveled. He states that he feels much better than he did a month ago. However he is also concerned by the weight loss. He denies any fevers or chills. Review of systems is only positive for diarrhea Past Medical History:  Diagnosis Date  . Arthritis   . Atrial fibrillation ([place])   . Cardiomyopathy   . Cerebrovascular accident ([place])    2012  . Diabetes mellitus   . Hyperlipidemia   . Hypertension   . Left-sided weakness   . 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 Zigmund Daniel, laser therapy OD  . Seizures ([place])    pt reports this is from low blood sugar   Past Surgical History:  Procedure Laterality Date  . Ear surgery as a child    . TONSILLECTOMY     Current Outpatient Medications on File Prior to Visit  Medication Sig Dispense Refill  . amLODipine (NORVASC) 10 MG tablet TAKE 1 TABLET BY MOUTH EVERY DAY 90 tablet 1  . atorvastatin (LIPITOR) 20 MG tablet Take 1 tablet (20 mg total) daily by mouth. 90 tablet 3  . carvedilol (COREG) 25 MG tablet TAKE 1 TABLET BY MOUTH TWICE A DAY WITH A MEAL 180 tablet 1  . hydrALAZINE (APRESOLINE) 50 MG tablet TAKE 1 TABLET BY MOUTH THREE TIMES A DAY 270 tablet 1  . Insulin Detemir (LEVEMIR FLEXTOUCH) 100 UNIT/ML Pen INJECT 70 UNITS UNDER THE  SKIN EVERY MORNING AND 40 UNITS AT BEDTIME 135 mL 2  . Insulin Pen Needle 31G X 5 MM MISC Use daily with insulin 100 each 5  . Insulin Syringe-Needle U-100 (INSULIN SYRINGE .3CC/31GX5/16") 31G X 5/16" 0.3 ML MISC Use twice daily to inject insulin. 100 each 10  . ketoconazole (NIZORAL) 2 % shampoo Apply 1 application topically 2 (two) times a week. 120 mL 11  . metFORMIN (GLUCOPHAGE) 850 MG tablet Take 1 tablet (850 mg total) by mouth 2 (two) times daily with a meal. 180 tablet 5  . ONE TOUCH ULTRA TEST test strip CHECK BLOOD SUGAR FOUR TIMES DAILY 400 each 3  . warfarin (COUMADIN) 10 MG tablet TAKE 1 TABLET BY MOUTH EVERY DAY EXCEPT 1/2 TAB ON FRIDAYS AND MONDAYS (Patient taking differently: TAKE 1 TABLET BY MOUTH EVERY DAY) 30 tablet 11   No current facility-administered medications on file prior to visit.    Allergies  Allergen Reactions  . Codeine Other (See Comments)    Unknown reaction, severe headach   Social History   Socioeconomic History  . Marital status: Divorced    Spouse name: Not on file  . Number of children: 2  . Years of education: Not on file  . Highest education level: Not on file  Social Needs  .  Financial resource strain: Not on file  . Food insecurity - worry: Not on file  . Food insecurity - inability: Not on file  . Transportation needs - medical: Not on file  . Transportation needs - non-medical: Not on file  Occupational History  . Occupation: Disabled  Tobacco Use  . Smoking status: Never Smoker  . Smokeless tobacco: Never Used  Substance and Sexual Activity  . Alcohol use: Yes    Alcohol/week: 0.0 oz    Comment: seldom  . Drug use: Yes    Frequency: 7.0 times per week    Comment: marijauna for arthritis  . Sexual activity: Not on file  Other Topics Concern  . Not on file  Social History Narrative   He has children.      Review of Systems  Constitutional: Positive for activity change, appetite change and fatigue.  HENT: Negative.     Respiratory: Negative.   Cardiovascular: Negative.   Gastrointestinal: Positive for diarrhea. Negative for abdominal distention, abdominal pain, anal bleeding, blood in stool and constipation.  Endocrine: Negative.        Objective:   Physical Exam  Cardiovascular: Normal rate, regular rhythm and normal heart sounds.  Pulmonary/Chest: Effort normal and breath sounds normal.  Abdominal: Soft. Bowel sounds are normal. He exhibits no distension. There is no tenderness. There is no rebound and no guarding.  Musculoskeletal:       Legs:         Assessment & Plan:  Atrial fibrillation, unspecified type ([place]) - Plan: PT with INR/Fingerstick  Weight loss, unintentional - Plan: CBC with Differential/Platelet, COMPLETE METABOLIC PANEL WITH GFR, Hemoglobin A1c, TSH, Sedimentation rate  Papilloma  Papilloma was anesthetized with 0.1% lidocaine with epinephrine. Patient was prepped and draped in sterile fashion.  It was excised using a scalpel.  Skin edges were then approximated with 3 simple interrupted 3-0 Ethilon sutures.  Patient tolerated the procedure well with no complications. Recheck in one week to remove sutures. Because of the weight loss, will check a CBC, CMP, TSH, A1c, and sedimentation rate. Recheck his weight in one week. INR today is therapeutic at 2.2. I will make no changes in his Coumadin dose and recheck his Coumadin in 6 weeks

## 2017-03-09 LAB — COMPLETE METABOLIC PANEL WITH GFR
AG RATIO: 1.6 (calc) (ref 1.0–2.5)
ALKALINE PHOSPHATASE (APISO): 57 U/L (ref 40–115)
ALT: 21 U/L (ref 9–46)
AST: 17 U/L (ref 10–35)
Albumin: 4.1 g/dL (ref 3.6–5.1)
BILIRUBIN TOTAL: 0.5 mg/dL (ref 0.2–1.2)
BUN/Creatinine Ratio: 15 (calc) (ref 6–22)
BUN: 21 mg/dL (ref 7–25)
CALCIUM: 9.5 mg/dL (ref 8.6–10.3)
CHLORIDE: 98 mmol/L (ref 98–110)
CO2: 28 mmol/L (ref 20–32)
Creat: 1.39 mg/dL — ABNORMAL HIGH (ref 0.70–1.25)
GFR, EST NON AFRICAN AMERICAN: 54 mL/min/{1.73_m2} — AB (ref >=60)
GFR, Est African American: 62 mL/min/{1.73_m2} (ref >=60)
Globulin: 2.6 g/dL (calc) (ref 1.9–3.7)
Glucose, Bld: 162 mg/dL — ABNORMAL HIGH (ref 65–99)
POTASSIUM: 4.2 mmol/L (ref 3.5–5.3)
Sodium: 138 mmol/L (ref 135–146)
Total Protein: 6.7 g/dL (ref 6.1–8.1)

## 2017-03-09 LAB — CBC WITH DIFFERENTIAL/PLATELET
Basophils Absolute: 30 cells/uL (ref 0–200)
Basophils Relative: 0.4 %
Eosinophils Absolute: 163 cells/uL (ref 15–500)
Eosinophils Relative: 2.2 %
HCT: 41.1 % (ref 38.5–50.0)
Hemoglobin: 13.7 g/dL (ref 13.2–17.1)
Lymphs Abs: 1961 cells/uL (ref 850–3900)
MCH: 28.4 pg (ref 27.0–33.0)
MCHC: 33.3 g/dL (ref 32.0–36.0)
MCV: 85.1 fL (ref 80.0–100.0)
MPV: 9.6 fL (ref 7.5–12.5)
Monocytes Relative: 10.2 %
NEUTROS PCT: 60.7 %
Neutro Abs: 4492 cells/uL (ref 1500–7800)
PLATELETS: 239 10*3/uL (ref 140–400)
RBC: 4.83 10*6/uL (ref 4.20–5.80)
RDW: 13.3 % (ref 11.0–15.0)
TOTAL LYMPHOCYTE: 26.5 %
WBC: 7.4 10*3/uL (ref 3.8–10.8)
WBCMIX: 755 {cells}/uL (ref 200–950)

## 2017-03-09 LAB — HEMOGLOBIN A1C
HEMOGLOBIN A1C: 7.8 %{Hb} — AB (ref <5.7)
MEAN PLASMA GLUCOSE: 177 (calc)
eAG (mmol/L): 9.8 (calc)

## 2017-03-09 LAB — TSH: TSH: 0.97 mIU/L (ref 0.40–4.50)

## 2017-03-09 LAB — SEDIMENTATION RATE: Sed Rate: 34 mm/h — ABNORMAL HIGH (ref 0–20)

## 2017-03-15 ENCOUNTER — Encounter: Payer: Self-pay | Admitting: Family Medicine

## 2017-03-15 ENCOUNTER — Ambulatory Visit (INDEPENDENT_AMBULATORY_CARE_PROVIDER_SITE_OTHER): Payer: Medicare HMO | Admitting: Family Medicine

## 2017-03-15 VITALS — BP 120/76 | HR 78 | Temp 98.0°F | Resp 16 | Ht 69.0 in | Wt 192.0 lb

## 2017-03-15 DIAGNOSIS — Z4802 Encounter for removal of sutures: Secondary | ICD-10-CM

## 2017-03-15 NOTE — Progress Notes (Signed)
Subjective:    Patient ID: Brian [name], male    DOB: [birth year], 62 y.o.   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 He would like this removed. Papilloma is approximately 12 mm in diameter. It has a pedunculated base. It is fleshy and skin colored.  He is also due to recheck his INR for his anticoagulation for atrial fibrillation. He has been out of the state for the last month visiting his son in [place]. While there, he contracted a viral illness with diarrhea that lasted 2 weeks. He is unintentionally lost 10 pounds since I last saw him.  Patient appears tired today. He also appears slightly disheveled. He states that he feels much better than he did a month ago. However he is also concerned by the weight loss. He denies any fevers or chills. Review of systems is only positive for diarrhea.  At that time, my plan was: Papilloma was anesthetized with 0.1% lidocaine with epinephrine. Patient was prepped and draped in sterile fashion.  It was excised using a scalpel.  Skin edges were then approximated with 3 simple interrupted 3-0 Ethilon sutures.  Patient tolerated the procedure well with no complications. Recheck in one week to remove sutures. Because of the weight loss, will check a CBC, CMP, TSH, A1c, and sedimentation rate. Recheck his weight in one week. INR today is therapeutic at 2.2. I will make no changes in his Coumadin dose and recheck his Coumadin in 6 weeks  03/15/17 Here for suture removal. Operative site is well healed without evidence of cellulitis.  All 3 sutures were removed without difficulty. Lab work was significant for a mild elevation in creatinine and a mild elevation in sedimentation rate. However patient feels much better today. His color looks better. His weight has stabilized and in fact he has gained 1 pound since his last visit Past Medical History:    Diagnosis Date  . Arthritis   . Atrial fibrillation ([place])   . Cardiomyopathy   . Cerebrovascular accident ([place])    2012  . Diabetes mellitus   . Hyperlipidemia   . Hypertension   . Left-sided weakness   . 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 Zigmund Daniel, laser therapy OD  . Seizures ([place])    pt reports this is from low blood sugar   Past Surgical History:  Procedure Laterality Date  . Ear surgery as a child    . TONSILLECTOMY     Current Outpatient Medications on File Prior to Visit  Medication Sig Dispense Refill  . amLODipine (NORVASC) 10 MG tablet TAKE 1 TABLET BY MOUTH EVERY DAY 90 tablet 1  . atorvastatin (LIPITOR) 20 MG tablet Take 1 tablet (20 mg total) daily by mouth. 90 tablet 3  . carvedilol (COREG) 25 MG tablet TAKE 1 TABLET BY MOUTH TWICE A DAY WITH A MEAL 180 tablet 1  . hydrALAZINE (APRESOLINE) 50 MG tablet TAKE 1 TABLET BY MOUTH THREE TIMES A DAY 270 tablet 1  . Insulin Detemir (LEVEMIR FLEXTOUCH) 100 UNIT/ML Pen INJECT 70 UNITS UNDER THE SKIN EVERY MORNING AND 40 UNITS AT BEDTIME 135 mL 2  . Insulin Pen Needle 31G X 5 MM MISC Use daily with insulin 100 each 5  . Insulin Syringe-Needle U-100 (INSULIN SYRINGE .3CC/31GX5/16") 31G X 5/16" 0.3 ML MISC Use twice daily to inject insulin. 100 each 10  .  ketoconazole (NIZORAL) 2 % shampoo Apply 1 application topically 2 (two) times a week. 120 mL 11  . metFORMIN (GLUCOPHAGE) 850 MG tablet Take 1 tablet (850 mg total) by mouth 2 (two) times daily with a meal. 180 tablet 5  . ONE TOUCH ULTRA TEST test strip CHECK BLOOD SUGAR FOUR TIMES DAILY 400 each 3  . warfarin (COUMADIN) 10 MG tablet TAKE 1 TABLET BY MOUTH EVERY DAY EXCEPT 1/2 TAB ON FRIDAYS AND MONDAYS (Patient taking differently: TAKE 1 TABLET BY MOUTH EVERY DAY) 30 tablet 11   No current facility-administered medications on file prior to visit.    Allergies  Allergen Reactions  . Codeine Other (See Comments)    Unknown reaction, severe headach   Social  History   Socioeconomic History  . Marital status: Divorced    Spouse name: Not on file  . Number of children: 2  . Years of education: Not on file  . Highest education level: Not on file  Social Needs  . Financial resource strain: Not on file  . Food insecurity - worry: Not on file  . Food insecurity - inability: Not on file  . Transportation needs - medical: Not on file  . Transportation needs - non-medical: Not on file  Occupational History  . Occupation: Disabled  Tobacco Use  . Smoking status: Never Smoker  . Smokeless tobacco: Never Used  Substance and Sexual Activity  . Alcohol use: Yes    Alcohol/week: 0.0 oz    Comment: seldom  . Drug use: Yes    Frequency: 7.0 times per week    Comment: marijauna for arthritis  . Sexual activity: Not on file  Other Topics Concern  . Not on file  Social History Narrative   He has children.      Review of Systems  Constitutional: Positive for activity change, appetite change and fatigue.  HENT: Negative.   Respiratory: Negative.   Cardiovascular: Negative.   Gastrointestinal: Positive for diarrhea. Negative for abdominal distention, abdominal pain, anal bleeding, blood in stool and constipation.  Endocrine: Negative.        Objective:   Physical Exam  Cardiovascular: Normal rate, regular rhythm and normal heart sounds.  Pulmonary/Chest: Effort normal and breath sounds normal.  Abdominal: Soft. Bowel sounds are normal. He exhibits no distension. There is no tenderness. There is no rebound and no guarding.  Musculoskeletal:       Legs:         Assessment & Plan:  Visit for suture removal  Sutures removed without difficulty. I believe that the patient was weak at his last visit due to his severe diarrhea which has since resolved. Clinically now he appears much better and appears to be approaching his baseline. Follow-up in 6 weeks for regular checkup. We did discuss his mild elevation in hemoglobin A1c to 7.8. He  would like to be more fastidious about his diet and recheck in 3 months

## 2017-04-26 ENCOUNTER — Ambulatory Visit (INDEPENDENT_AMBULATORY_CARE_PROVIDER_SITE_OTHER): Payer: Medicare HMO | Admitting: Family Medicine

## 2017-04-26 ENCOUNTER — Encounter: Payer: Self-pay | Admitting: Family Medicine

## 2017-04-26 DIAGNOSIS — I4891 Unspecified atrial fibrillation: Secondary | ICD-10-CM

## 2017-04-26 LAB — PT WITH INR/FINGERSTICK
INR, fingerstick: 1.8 ratio — ABNORMAL HIGH
PT, fingerstick: 21.3 s — ABNORMAL HIGH (ref 10.5–13.1)

## 2017-04-26 MED ORDER — CYCLOBENZAPRINE HCL 10 MG PO TABS: 10.0000 mg | ORAL_TABLET | Freq: Three times a day (TID) | ORAL | 0 refills | Status: DC | PRN

## 2017-04-26 NOTE — Progress Notes (Signed)
Subjective:    Patient ID: Brian [name], male    DOB: [birth year], 62 y.o.   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 He would like this removed. Papilloma is approximately 12 mm in diameter. It has a pedunculated base. It is fleshy and skin colored.  He is also due to recheck his INR for his anticoagulation for atrial fibrillation. He has been out of the state for the last month visiting his son in [place]. While there, he contracted a viral illness with diarrhea that lasted 2 weeks. He is unintentionally lost 10 pounds since I last saw him.  Patient appears tired today. He also appears slightly disheveled. He states that he feels much better than he did a month ago. However he is also concerned by the weight loss. He denies any fevers or chills. Review of systems is only positive for diarrhea.  At that time, my plan was: Papilloma was anesthetized with 0.1% lidocaine with epinephrine. Patient was prepped and draped in sterile fashion.  It was excised using a scalpel.  Skin edges were then approximated with 3 simple interrupted 3-0 Ethilon sutures.  Patient tolerated the procedure well with no complications. Recheck in one week to remove sutures. Because of the weight loss, will check a CBC, CMP, TSH, A1c, and sedimentation rate. Recheck his weight in one week. INR today is therapeutic at 2.2. I will make no changes in his Coumadin dose and recheck his Coumadin in 6 weeks  03/15/17 Here for suture removal. Operative site is well healed without evidence of cellulitis.  All 3 sutures were removed without difficulty. Lab work was significant for a mild elevation in creatinine and a mild elevation in sedimentation rate. However patient feels much better today. His color looks better. His weight has stabilized and in fact he has gained 1 pound since his last visit.  At that time, my plan  was: Sutures removed without difficulty. I believe that the patient was weak at his last visit due to his severe diarrhea which has since resolved. Clinically now he appears much better and appears to be approaching his baseline. Follow-up in 6 weeks for regular checkup. We did discuss his mild elevation in hemoglobin A1c to 7.8. He would like to be more fastidious about his diet and recheck in 3 months   04/26/17 Patient is here today to recheck his INR. He is due to repeat a hemoglobin A1c in late April or early May.  INR today is subtherapeutic at 1.8. Patient takes Coumadin 10 mg every day however he missed a dose 3 days ago. He denies any bleeding or bruising. He did recently strained his lower back. He is having muscle stiffness and muscle soreness in his right flank. It hurts to stand and walk at times. He denies any falls or injuries. He is not sure how he pulled his back Past Medical History:  Diagnosis Date  . Arthritis   . Atrial fibrillation ([place])   . Cardiomyopathy   . Cerebrovascular accident ([place])    2012  . Diabetes mellitus   . Hyperlipidemia   . Hypertension   . Left-sided weakness   . 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 Zigmund Daniel, laser therapy OD  . Seizures ([place])    pt reports this is from low blood sugar   Past Surgical History:  Procedure Laterality Date  .  Ear surgery as a child    . TONSILLECTOMY     Current Outpatient Medications on File Prior to Visit  Medication Sig Dispense Refill  . amLODipine (NORVASC) 10 MG tablet TAKE 1 TABLET BY MOUTH EVERY DAY 90 tablet 1  . atorvastatin (LIPITOR) 20 MG tablet Take 1 tablet (20 mg total) daily by mouth. 90 tablet 3  . carvedilol (COREG) 25 MG tablet TAKE 1 TABLET BY MOUTH TWICE A DAY WITH A MEAL 180 tablet 1  . hydrALAZINE (APRESOLINE) 50 MG tablet TAKE 1 TABLET BY MOUTH THREE TIMES A DAY 270 tablet 1  . Insulin Detemir (LEVEMIR FLEXTOUCH) 100 UNIT/ML Pen INJECT 70 UNITS UNDER THE SKIN EVERY MORNING AND 40  UNITS AT BEDTIME 135 mL 2  . Insulin Pen Needle 31G X 5 MM MISC Use daily with insulin 100 each 5  . Insulin Syringe-Needle U-100 (INSULIN SYRINGE .3CC/31GX5/16") 31G X 5/16" 0.3 ML MISC Use twice daily to inject insulin. 100 each 10  . ketoconazole (NIZORAL) 2 % shampoo Apply 1 application topically 2 (two) times a week. 120 mL 11  . metFORMIN (GLUCOPHAGE) 850 MG tablet Take 1 tablet (850 mg total) by mouth 2 (two) times daily with a meal. 180 tablet 5  . ONE TOUCH ULTRA TEST test strip CHECK BLOOD SUGAR FOUR TIMES DAILY 400 each 3  . warfarin (COUMADIN) 10 MG tablet TAKE 1 TABLET BY MOUTH EVERY DAY EXCEPT 1/2 TAB ON FRIDAYS AND MONDAYS (Patient taking differently: TAKE 1 TABLET BY MOUTH EVERY DAY) 30 tablet 11   No current facility-administered medications on file prior to visit.    Allergies  Allergen Reactions  . Codeine Other (See Comments)    Unknown reaction, severe headach   Social History   Socioeconomic History  . Marital status: Divorced    Spouse name: Not on file  . Number of children: 2  . Years of education: Not on file  . Highest education level: Not on file  Social Needs  . Financial resource strain: Not on file  . Food insecurity - worry: Not on file  . Food insecurity - inability: Not on file  . Transportation needs - medical: Not on file  . Transportation needs - non-medical: Not on file  Occupational History  . Occupation: Disabled  Tobacco Use  . Smoking status: Never Smoker  . Smokeless tobacco: Never Used  Substance and Sexual Activity  . Alcohol use: Yes    Alcohol/week: 0.0 oz    Comment: seldom  . Drug use: Yes    Frequency: 7.0 times per week    Comment: marijauna for arthritis  . Sexual activity: Not on file  Other Topics Concern  . Not on file  Social History Narrative   He has children.      Review of Systems   Otherwise Negative.         Objective:   Physical Exam  Cardiovascular: Normal rate, regular rhythm and normal heart  sounds.  Pulmonary/Chest: Effort normal and breath sounds normal.  Abdominal: Soft. Bowel sounds are normal. He exhibits no distension. There is no tenderness. There is no rebound and no guarding.          Assessment & Plan:  Atrial fibrillation, unspecified type ([place]) - Plan: PT with INR/Fingerstick INR is subtherapeutic. Take an extra half tablet of Coumadin today, 5 mg. Then resume his previous dose of 10 mg a day and recheck INR in 6 weeks. In 6 weeks he will be due for fasting  lab work including his hemoglobin A1c. I will give the patient Flexeril 5 mg to 10 mg every 8 hours as needed for muscle stiffness.

## 2017-05-01 ENCOUNTER — Other Ambulatory Visit: Payer: Self-pay | Admitting: Family Medicine

## 2017-05-01 MED ORDER — INSULIN PEN NEEDLE 31G X 5 MM MISC: 5 refills | Status: DC

## 2017-05-03 ENCOUNTER — Telehealth: Payer: Self-pay | Admitting: Family Medicine

## 2017-05-03 ENCOUNTER — Other Ambulatory Visit: Payer: Self-pay | Admitting: Family Medicine

## 2017-05-03 MED ORDER — HYDROCODONE-ACETAMINOPHEN 5-325 MG PO TABS: 1.0000 | ORAL_TABLET | Freq: Four times a day (QID) | ORAL | 0 refills | Status: DC | PRN

## 2017-05-03 NOTE — Telephone Encounter (Signed)
Patient called [facility] stating that the muscle relaxer is not helping and was wanting to know if you could send in some pain medication. Please advise.

## 2017-05-03 NOTE — Telephone Encounter (Signed)
I sent 20 norco to his pharmacy.

## 2017-05-06 NOTE — Telephone Encounter (Signed)
Pt aware via vm 

## 2017-06-10 ENCOUNTER — Ambulatory Visit (INDEPENDENT_AMBULATORY_CARE_PROVIDER_SITE_OTHER): Payer: Medicare HMO | Admitting: Family Medicine

## 2017-06-10 ENCOUNTER — Other Ambulatory Visit: Payer: Self-pay | Admitting: Family Medicine

## 2017-06-10 ENCOUNTER — Other Ambulatory Visit: Payer: Medicare HMO

## 2017-06-10 ENCOUNTER — Encounter: Payer: Self-pay | Admitting: Family Medicine

## 2017-06-10 VITALS — BP 124/80 | HR 62 | Temp 97.8°F | Resp 16 | Ht 69.0 in | Wt 197.0 lb

## 2017-06-10 DIAGNOSIS — E119 Type 2 diabetes mellitus without complications: Secondary | ICD-10-CM | POA: Diagnosis not present

## 2017-06-10 DIAGNOSIS — Z794 Long term (current) use of insulin: Secondary | ICD-10-CM | POA: Diagnosis not present

## 2017-06-10 DIAGNOSIS — E7800 Pure hypercholesterolemia, unspecified: Secondary | ICD-10-CM

## 2017-06-10 DIAGNOSIS — I4891 Unspecified atrial fibrillation: Secondary | ICD-10-CM

## 2017-06-10 DIAGNOSIS — I10 Essential (primary) hypertension: Secondary | ICD-10-CM

## 2017-06-10 LAB — PT WITH INR/FINGERSTICK
INR, fingerstick: 2.4 ratio — ABNORMAL HIGH
PT, fingerstick: 29.2 s — ABNORMAL HIGH (ref 10.5–13.1)

## 2017-06-10 NOTE — Progress Notes (Signed)
Subjective:    Patient ID: Brian [name], male    DOB: [birth year], 62 y.o.   MRN: [id number]  Medication Refill    The patient's INR today is therapeutic at 2.4. He is taking 10 mg every day. He denies any bleeding or bruising. He denies any complications.  He is also here to discuss management of his diabetes.  He is currently on a combination of metformin in addition to Levemir 70 units in the morning and 40 units in the afternoon.  He has been off his insulin for the last 3 days because he had no money to afford it.  However he states that his sugars have been doing "good" when he is taking his insulin.  He states that his fasting sugars are typically between 101 130 and his 2-hour postprandial sugars are under 200.  He denies any episodes of hypoglycemia.  He denies any polyuria, polydipsia, or blurry vision.  He denies any neuropathy in his feet.  Diabetic foot exam is performed today.  Blood pressure is excellent at 124/80.  He denies any chest pain shortness of breath or dyspnea on exertion. Past Medical History:  Diagnosis Date  . Arthritis   . Atrial fibrillation ([place])   . Cardiomyopathy   . Cerebrovascular accident ([place])    2012  . Diabetes mellitus   . Hyperlipidemia   . Hypertension   . Left-sided weakness   . 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 Zigmund Daniel, laser therapy OD  . Seizures ([place])    pt reports this is from low blood sugar   Past Surgical History:  Procedure Laterality Date  . Ear surgery as a child    . TONSILLECTOMY     Current Outpatient Medications on File Prior to Visit  Medication Sig Dispense Refill  . amLODipine (NORVASC) 10 MG tablet TAKE 1 TABLET BY MOUTH EVERY DAY 90 tablet 1  . atorvastatin (LIPITOR) 20 MG tablet Take 1 tablet (20 mg total) daily by mouth. 90 tablet 3  . carvedilol (COREG) 25 MG tablet TAKE 1 TABLET BY MOUTH TWICE A DAY WITH A MEAL 180 tablet 1  . cyclobenzaprine (FLEXERIL) 10 MG tablet Take 1 tablet (10 mg total)  by mouth 3 (three) times daily as needed for muscle spasms. 30 tablet 0  . hydrALAZINE (APRESOLINE) 50 MG tablet TAKE 1 TABLET BY MOUTH THREE TIMES A DAY 270 tablet 1  . HYDROcodone-acetaminophen (NORCO) 5-325 MG tablet Take 1 tablet by mouth every 6 (six) hours as needed for moderate pain. 20 tablet 0  . Insulin Detemir (LEVEMIR FLEXTOUCH) 100 UNIT/ML Pen INJECT 70 UNITS UNDER THE SKIN EVERY MORNING AND 40 UNITS AT BEDTIME 135 mL 2  . Insulin Pen Needle 31G X 5 MM MISC Use daily with insulin 100 each 5  . Insulin Syringe-Needle U-100 (INSULIN SYRINGE .3CC/31GX5/16") 31G X 5/16" 0.3 ML MISC Use twice daily to inject insulin. 100 each 10  . ketoconazole (NIZORAL) 2 % shampoo Apply 1 application topically 2 (two) times a week. 120 mL 11  . metFORMIN (GLUCOPHAGE) 850 MG tablet Take 1 tablet (850 mg total) by mouth 2 (two) times daily with a meal. 180 tablet 5  . ONE TOUCH ULTRA TEST test strip CHECK BLOOD SUGAR FOUR TIMES DAILY 400 each 3  . warfarin (COUMADIN) 10 MG tablet TAKE 1 TABLET BY MOUTH EVERY DAY EXCEPT 1/2 TAB ON FRIDAYS AND MONDAYS (Patient taking differently: TAKE 1 TABLET BY MOUTH EVERY DAY) 30 tablet  11   No current facility-administered medications on file prior to visit.    Allergies  Allergen Reactions  . Codeine Other (See Comments)    Unknown reaction, severe headach   Social History   Socioeconomic History  . Marital status: Divorced    Spouse name: Not on file  . Number of children: 2  . Years of education: Not on file  . Highest education level: Not on file  Occupational History  . Occupation: Disabled  Social Needs  . Financial resource strain: Not on file  . Food insecurity:    Worry: Not on file    Inability: Not on file  . Transportation needs:    Medical: Not on file    Non-medical: Not on file  Tobacco Use  . Smoking status: Never Smoker  . Smokeless tobacco: Never Used  Substance and Sexual Activity  . Alcohol use: Yes    Alcohol/week: 0.0 oz     Comment: seldom  . Drug use: Yes    Frequency: 7.0 times per week    Comment: marijauna for arthritis  . Sexual activity: Not on file  Lifestyle  . Physical activity:    Days per week: Not on file    Minutes per session: Not on file  . Stress: Not on file  Relationships  . Social connections:    Talks on phone: Not on file    Gets together: Not on file    Attends religious service: Not on file    Active member of club or organization: Not on file    Attends meetings of clubs or organizations: Not on file    Relationship status: Not on file  . Intimate partner violence:    Fear of current or ex partner: Not on file    Emotionally abused: Not on file    Physically abused: Not on file    Forced sexual activity: Not on file  Other Topics Concern  . Not on file  Social History Narrative   He has children.    Review of Systems  All other systems reviewed and are negative.      Objective:   Physical Exam  Cardiovascular: Normal rate, regular rhythm and normal heart sounds.  Pulmonary/Chest: Effort normal and breath sounds normal. No respiratory distress. He has no wheezes. He has no rales. He exhibits no tenderness.  Abdominal: Soft. Bowel sounds are normal. He exhibits no distension. There is no tenderness. There is no rebound.  Musculoskeletal: He exhibits no edema.  Vitals reviewed.         Assessment & Plan:  Type 2 diabetes mellitus without complication, with long-term current use of insulin ([facility]) - Plan: CBC with Differential/Platelet, COMPLETE METABOLIC PANEL WITH GFR, Lipid panel, Microalbumin, urine, Hemoglobin A1c  Atrial fibrillation, unspecified type ([place]) - Plan: PT with INR/Fingerstick  Essential hypertension, benign  Pure hypercholesterolemia  INR is therapeutic. Recheck INR in 4 weeks. Continue Coumadin 10 mg a day.  Patient's blood pressure is excellent.  His heart rate is well controlled.  I will make no changes in his medication.  I will check a  fasting lipid panel.  Goal LDL cholesterol is less than 100.  I will also check a urine microalbumin and hemoglobin A1c.  Goal hemoglobin A1c is less than 7.0.  I gave patient samples of Lantus until he can afford his Levemir.

## 2017-06-11 LAB — CBC WITH DIFFERENTIAL/PLATELET
BASOS PCT: 0.4 %
Basophils Absolute: 33 cells/uL (ref 0–200)
EOS ABS: 100 {cells}/uL (ref 15–500)
Eosinophils Relative: 1.2 %
HCT: 42.1 % (ref 38.5–50.0)
HEMOGLOBIN: 14.4 g/dL (ref 13.2–17.1)
LYMPHS ABS: 1843 {cells}/uL (ref 850–3900)
MCH: 29 pg (ref 27.0–33.0)
MCHC: 34.2 g/dL (ref 32.0–36.0)
MCV: 84.9 fL (ref 80.0–100.0)
MPV: 9.6 fL (ref 7.5–12.5)
Monocytes Relative: 9.5 %
NEUTROS ABS: 5536 {cells}/uL (ref 1500–7800)
Neutrophils Relative %: 66.7 %
Platelets: 229 10*3/uL (ref 140–400)
RBC: 4.96 10*6/uL (ref 4.20–5.80)
RDW: 12.9 % (ref 11.0–15.0)
Total Lymphocyte: 22.2 %
WBC: 8.3 10*3/uL (ref 3.8–10.8)
WBCMIX: 789 {cells}/uL (ref 200–950)

## 2017-06-11 LAB — COMPLETE METABOLIC PANEL WITH GFR
AG RATIO: 2 (calc) (ref 1.0–2.5)
ALBUMIN MSPROF: 4.5 g/dL (ref 3.6–5.1)
ALT: 13 U/L (ref 9–46)
AST: 11 U/L (ref 10–35)
Alkaline phosphatase (APISO): 54 U/L (ref 40–115)
BILIRUBIN TOTAL: 0.5 mg/dL (ref 0.2–1.2)
BUN / CREAT RATIO: 17 (calc) (ref 6–22)
BUN: 21 mg/dL (ref 7–25)
CALCIUM: 9.2 mg/dL (ref 8.6–10.3)
CO2: 26 mmol/L (ref 20–32)
Chloride: 100 mmol/L (ref 98–110)
Creat: 1.26 mg/dL — ABNORMAL HIGH (ref 0.70–1.25)
GFR, EST AFRICAN AMERICAN: 70 mL/min/{1.73_m2} (ref >=60)
GFR, EST NON AFRICAN AMERICAN: 60 mL/min/{1.73_m2} (ref >=60)
Globulin: 2.3 g/dL (calc) (ref 1.9–3.7)
Glucose, Bld: 155 mg/dL — ABNORMAL HIGH (ref 65–99)
POTASSIUM: 4.2 mmol/L (ref 3.5–5.3)
Sodium: 134 mmol/L — ABNORMAL LOW (ref 135–146)
TOTAL PROTEIN: 6.8 g/dL (ref 6.1–8.1)

## 2017-06-11 LAB — MICROALBUMIN, URINE: Microalb, Ur: 1.9 mg/dL

## 2017-06-11 LAB — LIPID PANEL
Cholesterol: 139 mg/dL (ref <200)
HDL: 31 mg/dL — ABNORMAL LOW (ref >40)
LDL CHOLESTEROL (CALC): 81 mg/dL
NON-HDL CHOLESTEROL (CALC): 108 mg/dL (ref <130)
Total CHOL/HDL Ratio: 4.5 (calc) (ref <5.0)
Triglycerides: 161 mg/dL — ABNORMAL HIGH (ref <150)

## 2017-06-11 LAB — HEMOGLOBIN A1C
EAG (MMOL/L): 9.2 (calc)
HEMOGLOBIN A1C: 7.4 %{Hb} — AB (ref <5.7)
MEAN PLASMA GLUCOSE: 166 (calc)

## 2017-07-26 ENCOUNTER — Encounter: Payer: Self-pay | Admitting: Family Medicine

## 2017-07-26 ENCOUNTER — Ambulatory Visit (INDEPENDENT_AMBULATORY_CARE_PROVIDER_SITE_OTHER): Payer: Medicare HMO | Admitting: Family Medicine

## 2017-07-26 VITALS — BP 146/78 | HR 74 | Temp 98.0°F | Resp 16 | Ht 69.0 in | Wt 190.0 lb

## 2017-07-26 DIAGNOSIS — I10 Essential (primary) hypertension: Secondary | ICD-10-CM

## 2017-07-26 DIAGNOSIS — E119 Type 2 diabetes mellitus without complications: Secondary | ICD-10-CM | POA: Diagnosis not present

## 2017-07-26 DIAGNOSIS — Z794 Long term (current) use of insulin: Secondary | ICD-10-CM

## 2017-07-26 DIAGNOSIS — E7800 Pure hypercholesterolemia, unspecified: Secondary | ICD-10-CM

## 2017-07-26 DIAGNOSIS — I4891 Unspecified atrial fibrillation: Secondary | ICD-10-CM | POA: Diagnosis not present

## 2017-07-26 LAB — PT WITH INR/FINGERSTICK
INR, fingerstick: 2.3 ratio — ABNORMAL HIGH
PT, fingerstick: 27.9 s — ABNORMAL HIGH (ref 10.5–13.1)

## 2017-07-26 MED ORDER — LOSARTAN POTASSIUM 25 MG PO TABS: 25.0000 mg | ORAL_TABLET | Freq: Every day | ORAL | 3 refills | Status: DC

## 2017-07-26 NOTE — Progress Notes (Signed)
Subjective:    Patient ID: Brian [name], male    DOB: Aug 12, 1955, 62 y.o.   MRN: [id number]  Medication Refill   06/10/17 The patient's INR today is therapeutic at 2.4. He is taking 10 mg every day. He denies any bleeding or bruising. He denies any complications.  He is also here to discuss management of his diabetes.  He is currently on a combination of metformin in addition to Levemir 70 units in the morning and 40 units in the afternoon.  He has been off his insulin for the last 3 days because he had no money to afford it.  However he states that his sugars have been doing "good" when he is taking his insulin.  He states that his fasting sugars are typically between 101 130 and his 2-hour postprandial sugars are under 200.  He denies any episodes of hypoglycemia.  He denies any polyuria, polydipsia, or blurry vision.  He denies any neuropathy in his feet.  Diabetic foot exam is performed today.  Blood pressure is excellent at 124/80.  He denies any chest pain shortness of breath or dyspnea on exertion.  At that time, my plan was: INR is therapeutic. Recheck INR in 4 weeks. Continue Coumadin 10 mg a day.  Patient's blood pressure is excellent.  His heart rate is well controlled.  I will make no changes in his medication.  I will check a fasting lipid panel.  Goal LDL cholesterol is less than 100.  I will also check a urine microalbumin and hemoglobin A1c.  Goal hemoglobin A1c is less than 7.0.  I gave patient samples of Lantus until he can afford his Levemir.  07/26/17 Patient is here today to recheck his INR.  His most recent lab work obtained at his last visit as shown below: No visits with results within 1 Month(s) from this visit.  Latest known visit with results is:  Office Visit on 06/10/2017  Component Date Value Ref Range Status  . INR, fingerstick 06/10/2017 2.4* ratio Final   Comment: INRs >2.9 may be falsely elevated in patients receiving either unfractionated Heparin or Low  Molecular Weight Heparin. Follow up testing in a hospital laboratory may be helpful if clinically indicated.  . INR results of >5.0 should be verified using the  standard venipuncture procedure. Reference Range                     0.9-1.1 Moderate-intensity Warfarin Therapy 2.0-3.0 Higher-intensity Warfarin Therapy   3.0-4.0  .   . PT, fingerstick 06/10/2017 29.2* 10.5 - 13.1 sec Final   Comment: . Point of care fingerstick Prothrombin Time/INR  results may vary from venous Prothrombin Time/INR  methodologies. Any results exhibiting inconsistency  with the patient's clinical status should be repeated using a venous Prothrombin Time/INR method. .   . WBC 06/10/2017 8.3  3.8 - 10.8 Thousand/uL Final  . RBC 06/10/2017 4.96  4.20 - 5.80 Million/uL Final  . Hemoglobin 06/10/2017 14.4  13.2 - 17.1 g/dL Final  . HCT 06/10/2017 42.1  38.5 - 50.0 % Final  . MCV 06/10/2017 84.9  80.0 - 100.0 fL Final  . MCH 06/10/2017 29.0  27.0 - 33.0 pg Final  . MCHC 06/10/2017 34.2  32.0 - 36.0 g/dL Final  . RDW 06/10/2017 12.9  11.0 - 15.0 % Final  . Platelets 06/10/2017 229  140 - 400 Thousand/uL Final  . MPV 06/10/2017 9.6  7.5 - 12.5 fL Final  . Neutro Abs  06/10/2017 5,536  1,500 - 7,800 cells/uL Final  . Lymphs Abs 06/10/2017 1,843  850 - 3,900 cells/uL Final  . WBC mixed population 06/10/2017 789  200 - 950 cells/uL Final  . Eosinophils Absolute 06/10/2017 100  15 - 500 cells/uL Final  . Basophils Absolute 06/10/2017 33  0 - 200 cells/uL Final  . Neutrophils Relative % 06/10/2017 66.7  % Final  . Total Lymphocyte 06/10/2017 22.2  % Final  . Monocytes Relative 06/10/2017 9.5  % Final  . Eosinophils Relative 06/10/2017 1.2  % Final  . Basophils Relative 06/10/2017 0.4  % Final  . Glucose, Bld 06/10/2017 155* 65 - 99 mg/dL Final   Comment: .            Fasting reference interval . For someone without known diabetes, a glucose value >125 mg/dL indicates that they may have diabetes and this  should be confirmed with a follow-up test. .   . BUN 06/10/2017 21  7 - 25 mg/dL Final  . Creat 06/10/2017 1.26* 0.70 - 1.25 mg/dL Final   Comment: For patients >79 years of age, the reference limit for Creatinine is approximately 13% higher for people identified as African-American. .   . GFR, Est Non African American 06/10/2017 60  > OR = 60 mL/min/1.74m2 Final  . GFR, Est African American 06/10/2017 70  > OR = 60 mL/min/1.34m2 Final  . BUN/Creatinine Ratio 06/10/2017 17  6 - 22 (calc) Final  . Sodium 06/10/2017 134* 135 - 146 mmol/L Final  . Potassium 06/10/2017 4.2  3.5 - 5.3 mmol/L Final  . Chloride 06/10/2017 100  98 - 110 mmol/L Final  . CO2 06/10/2017 26  20 - 32 mmol/L Final  . Calcium 06/10/2017 9.2  8.6 - 10.3 mg/dL Final  . Total Protein 06/10/2017 6.8  6.1 - 8.1 g/dL Final  . Albumin 06/10/2017 4.5  3.6 - 5.1 g/dL Final  . Globulin 06/10/2017 2.3  1.9 - 3.7 g/dL (calc) Final  . AG Ratio 06/10/2017 2.0  1.0 - 2.5 (calc) Final  . Total Bilirubin 06/10/2017 0.5  0.2 - 1.2 mg/dL Final  . Alkaline phosphatase (APISO) 06/10/2017 54  40 - 115 U/L Final  . AST 06/10/2017 11  10 - 35 U/L Final  . ALT 06/10/2017 13  9 - 46 U/L Final  . Cholesterol 06/10/2017 139  <200 mg/dL Final  . HDL 06/10/2017 31* >40 mg/dL Final  . Triglycerides 06/10/2017 161* <150 mg/dL Final  . LDL Cholesterol (Calc) 06/10/2017 81  mg/dL (calc) Final   Comment: Reference range: <100 . Desirable range <100 mg/dL for primary prevention;   <70 mg/dL for patients with CHD or diabetic patients  with > or = 2 CHD risk factors. Marland Kitchen LDL-C is now calculated using the Martin-Hopkins  calculation, which is a validated novel method providing  better accuracy than the Friedewald equation in the  estimation of LDL-C.  Cresenciano Genre et al. Annamaria Helling. 2542;706(23): 2061-2068  (http://education.[facility].com/faq/FAQ164)   . Total CHOL/HDL Ratio 06/10/2017 4.5  <5.0 (calc) Final  . Non-HDL Cholesterol (Calc)  06/10/2017 108  <130 mg/dL (calc) Final   Comment: For patients with diabetes plus 1 major ASCVD risk  factor, treating to a non-HDL-C goal of <100 mg/dL  (LDL-C of <70 mg/dL) is considered a therapeutic  option.   Jacquelyne Balint, Ur 06/10/2017 1.9  mg/dL Final   Comment: Reference Range Not established   . RAM 06/10/2017    Final   Comment: . The ADA defines abnormalities  in albumin excretion as follows: Marland Kitchen Category         Result (mcg/mg creatinine) . Normal                    <30 Microalbuminuria         30-299  Clinical albuminuria   > OR = 300 . The ADA recommends that at least two of three specimens collected within a 3-6 month period be abnormal before considering a patient to be within a diagnostic category.   . Hgb A1c MFr Bld 06/10/2017 7.4* <5.7 % of total Hgb Final   Comment: For someone without known diabetes, a hemoglobin A1c value of 6.5% or greater indicates that they may have  diabetes and this should be confirmed with a follow-up  test. . For someone with known diabetes, a value <7% indicates  that their diabetes is well controlled and a value  greater than or equal to 7% indicates suboptimal  control. A1c targets should be individualized based on  duration of diabetes, age, comorbid conditions, and  other considerations. . Currently, no consensus exists regarding use of hemoglobin A1c for diagnosis of diabetes for children. .   . Mean Plasma Glucose 06/10/2017 166  (calc) Final  . eAG (mmol/L) 06/10/2017 9.2  (calc) Final   For this patient, his hemoglobin A1c was acceptable at 7.4.  I was also very pleased with his LDL cholesterol below 181.  He denies any chest pain shortness of breath or dyspnea on exertion.  He denies any myalgias or right upper quadrant pain.  His blood pressure today is slightly elevated at 146/78.  Patient has chronic kidney disease stage III.  He is seen nephrology.  I reviewed her old note from his nephrologist from 2016 which  mentioned not using ACE inhibitor or angiotensin receptor blocker due to history of acute on chronic kidney disease.  Therefore the patient is on amlodipine and hydralazine for renal protection.  I had a discussion with the patient today about trying an angiotensin receptor blocker at a very low dose to try to prevent worsening of his kidney function as well as for renal protection from the diabetes.  Patient is willing to do this.  His INR today is therapeutic at 2.3 Past Medical History:  Diagnosis Date  . Arthritis   . Atrial fibrillation ([place])   . Cardiomyopathy   . Cerebrovascular accident ([place])    2012  . Diabetes mellitus   . Hyperlipidemia   . Hypertension   . Left-sided weakness   . 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 Zigmund Daniel, laser therapy OD  . Seizures ([place])    pt reports this is from low blood sugar   Past Surgical History:  Procedure Laterality Date  . Ear surgery as a child    . TONSILLECTOMY     Current Outpatient Medications on File Prior to Visit  Medication Sig Dispense Refill  . amLODipine (NORVASC) 10 MG tablet TAKE 1 TABLET BY MOUTH EVERY DAY 90 tablet 1  . atorvastatin (LIPITOR) 20 MG tablet Take 1 tablet (20 mg total) daily by mouth. 90 tablet 3  . carvedilol (COREG) 25 MG tablet TAKE 1 TABLET BY MOUTH TWICE A DAY WITH A MEAL 180 tablet 1  . cyclobenzaprine (FLEXERIL) 10 MG tablet Take 1 tablet (10 mg total) by mouth 3 (three) times daily as needed for muscle spasms. 30 tablet 0  . hydrALAZINE (APRESOLINE) 50 MG tablet TAKE 1 TABLET BY MOUTH  THREE TIMES A DAY 270 tablet 1  . HYDROcodone-acetaminophen (NORCO) 5-325 MG tablet Take 1 tablet by mouth every 6 (six) hours as needed for moderate pain. 20 tablet 0  . Insulin Detemir (LEVEMIR FLEXTOUCH) 100 UNIT/ML Pen INJECT 70 UNITS UNDER THE SKIN EVERY MORNING AND 40 UNITS AT BEDTIME 135 mL 2  . Insulin Pen Needle 31G X 5 MM MISC Use daily with insulin 100 each 5  . Insulin Syringe-Needle U-100 (INSULIN  SYRINGE .3CC/31GX5/16") 31G X 5/16" 0.3 ML MISC Use twice daily to inject insulin. 100 each 10  . ketoconazole (NIZORAL) 2 % shampoo Apply 1 application topically 2 (two) times a week. 120 mL 11  . metFORMIN (GLUCOPHAGE) 850 MG tablet Take 1 tablet (850 mg total) by mouth 2 (two) times daily with a meal. 180 tablet 5  . ONE TOUCH ULTRA TEST test strip CHECK BLOOD SUGAR FOUR TIMES DAILY 400 each 3  . warfarin (COUMADIN) 10 MG tablet TAKE 1 TABLET BY MOUTH EVERY DAY EXCEPT 1/2 TAB ON FRIDAYS AND MONDAYS (Patient taking differently: TAKE 1 TABLET BY MOUTH EVERY DAY) 30 tablet 11   No current facility-administered medications on file prior to visit.    Allergies  Allergen Reactions  . Codeine Other (See Comments)    Unknown reaction, severe headach   Social History   Socioeconomic History  . Marital status: Divorced    Spouse name: Not on file  . Number of children: 2  . Years of education: Not on file  . Highest education level: Not on file  Occupational History  . Occupation: Disabled  Social Needs  . Financial resource strain: Not on file  . Food insecurity:    Worry: Not on file    Inability: Not on file  . Transportation needs:    Medical: Not on file    Non-medical: Not on file  Tobacco Use  . Smoking status: Never Smoker  . Smokeless tobacco: Never Used  Substance and Sexual Activity  . Alcohol use: Yes    Alcohol/week: 0.0 oz    Comment: seldom  . Drug use: Yes    Frequency: 7.0 times per week    Comment: marijauna for arthritis  . Sexual activity: Not on file  Lifestyle  . Physical activity:    Days per week: Not on file    Minutes per session: Not on file  . Stress: Not on file  Relationships  . Social connections:    Talks on phone: Not on file    Gets together: Not on file    Attends religious service: Not on file    Active member of club or organization: Not on file    Attends meetings of clubs or organizations: Not on file    Relationship status: Not on  file  . Intimate partner violence:    Fear of current or ex partner: Not on file    Emotionally abused: Not on file    Physically abused: Not on file    Forced sexual activity: Not on file  Other Topics Concern  . Not on file  Social History Narrative   He has children.    Review of Systems  All other systems reviewed and are negative.      Objective:   Physical Exam  Cardiovascular: Normal rate, regular rhythm and normal heart sounds.  Pulmonary/Chest: Effort normal and breath sounds normal. No respiratory distress. He has no wheezes. He has no rales. He exhibits no tenderness.  Abdominal: Soft. Bowel sounds  are normal. He exhibits no distension. There is no tenderness. There is no rebound.  Musculoskeletal: He exhibits no edema.  Vitals reviewed.         Assessment & Plan:  Atrial fibrillation, unspecified type ([place]) - Plan: PT with INR/Fingerstick  Type 2 diabetes mellitus without complication, with long-term current use of insulin (HCC)  Essential hypertension, benign  Pure hypercholesterolemia  INR is therapeutic.  Given his slightly elevated blood pressure, his history of stage III chronic kidney disease, and his history of diabetes, I would like to try the patient on losartan 25 mg a day and recheck the patient's blood pressure and renal function at his next INR check in 4 to 6 weeks.  Will monitor his renal function and potassium at that time.  Hopefully the patient can tolerate a low-dose angiotensin receptor blocker which I think would be very beneficial in preventing his kidney function from deteriorating further.  I will make no changes in his Coumadin dosage and recheck INR in 4 to 6 weeks

## 2017-08-08 ENCOUNTER — Other Ambulatory Visit: Payer: Self-pay | Admitting: Family Medicine

## 2017-08-16 DIAGNOSIS — I10 Essential (primary) hypertension: Secondary | ICD-10-CM | POA: Diagnosis not present

## 2017-08-16 DIAGNOSIS — Z7901 Long term (current) use of anticoagulants: Secondary | ICD-10-CM | POA: Diagnosis not present

## 2017-08-16 DIAGNOSIS — E1165 Type 2 diabetes mellitus with hyperglycemia: Secondary | ICD-10-CM | POA: Diagnosis not present

## 2017-08-16 DIAGNOSIS — I4891 Unspecified atrial fibrillation: Secondary | ICD-10-CM | POA: Diagnosis not present

## 2017-08-16 DIAGNOSIS — Z803 Family history of malignant neoplasm of breast: Secondary | ICD-10-CM | POA: Diagnosis not present

## 2017-08-16 DIAGNOSIS — R32 Unspecified urinary incontinence: Secondary | ICD-10-CM | POA: Diagnosis not present

## 2017-08-16 DIAGNOSIS — N529 Male erectile dysfunction, unspecified: Secondary | ICD-10-CM | POA: Diagnosis not present

## 2017-08-16 DIAGNOSIS — E785 Hyperlipidemia, unspecified: Secondary | ICD-10-CM | POA: Diagnosis not present

## 2017-08-16 DIAGNOSIS — Z7984 Long term (current) use of oral hypoglycemic drugs: Secondary | ICD-10-CM | POA: Diagnosis not present

## 2017-08-16 DIAGNOSIS — I69334 Monoplegia of upper limb following cerebral infarction affecting left non-dominant side: Secondary | ICD-10-CM | POA: Diagnosis not present

## 2017-09-06 ENCOUNTER — Ambulatory Visit (INDEPENDENT_AMBULATORY_CARE_PROVIDER_SITE_OTHER): Payer: Medicare HMO | Admitting: Family Medicine

## 2017-09-06 ENCOUNTER — Encounter: Payer: Self-pay | Admitting: Family Medicine

## 2017-09-06 VITALS — BP 150/80 | HR 76 | Temp 98.0°F | Wt 192.0 lb

## 2017-09-06 DIAGNOSIS — I10 Essential (primary) hypertension: Secondary | ICD-10-CM | POA: Diagnosis not present

## 2017-09-06 DIAGNOSIS — Z794 Long term (current) use of insulin: Secondary | ICD-10-CM | POA: Diagnosis not present

## 2017-09-06 DIAGNOSIS — I4891 Unspecified atrial fibrillation: Secondary | ICD-10-CM | POA: Diagnosis not present

## 2017-09-06 DIAGNOSIS — E118 Type 2 diabetes mellitus with unspecified complications: Secondary | ICD-10-CM | POA: Diagnosis not present

## 2017-09-06 LAB — PT WITH INR/FINGERSTICK
INR, fingerstick: 2.4 ratio — ABNORMAL HIGH
PT, fingerstick: 29 s — ABNORMAL HIGH (ref 10.5–13.1)

## 2017-09-06 NOTE — Progress Notes (Signed)
Subjective:    Patient ID: Brian [name], male    DOB: 05/18/1955, 62 y.o.   MRN: [id number]  Medication Refill   06/10/17 The patient's INR today is therapeutic at 2.4. He is taking 10 mg every day. He denies any bleeding or bruising. He denies any complications.  He is also here to discuss management of his diabetes.  He is currently on a combination of metformin in addition to Levemir 70 units in the morning and 40 units in the afternoon.  He has been off his insulin for the last 3 days because he had no money to afford it.  However he states that his sugars have been doing "good" when he is taking his insulin.  He states that his fasting sugars are typically between 101 130 and his 2-hour postprandial sugars are under 200.  He denies any episodes of hypoglycemia.  He denies any polyuria, polydipsia, or blurry vision.  He denies any neuropathy in his feet.  Diabetic foot exam is performed today.  Blood pressure is excellent at 124/80.  He denies any chest pain shortness of breath or dyspnea on exertion.  At that time, my plan was: INR is therapeutic. Recheck INR in 4 weeks. Continue Coumadin 10 mg a day.  Patient's blood pressure is excellent.  His heart rate is well controlled.  I will make no changes in his medication.  I will check a fasting lipid panel.  Goal LDL cholesterol is less than 100.  I will also check a urine microalbumin and hemoglobin A1c.  Goal hemoglobin A1c is less than 7.0.  I gave patient samples of Lantus until he can afford his Levemir.  07/26/17 Patient is here today to recheck his INR.  For this patient, his hemoglobin A1c was acceptable at 7.4.  I was also very pleased with his LDL cholesterol below 181.  He denies any chest pain shortness of breath or dyspnea on exertion.  He denies any myalgias or right upper quadrant pain.  His blood pressure today is slightly elevated at 146/78.  Patient has chronic kidney disease stage III.  He is seen nephrology.  I reviewed her  old note from his nephrologist from 2016 which mentioned not using ACE inhibitor or angiotensin receptor blocker due to history of acute on chronic kidney disease.  Therefore the patient is on amlodipine and hydralazine for renal protection.  I had a discussion with the patient today about trying an angiotensin receptor blocker at a very low dose to try to prevent worsening of his kidney function as well as for renal protection from the diabetes.  Patient is willing to do this.  His INR today is therapeutic at 2.3.  At that time, my plan was: INR is therapeutic.  Given his slightly elevated blood pressure, his history of stage III chronic kidney disease, and his history of diabetes, I would like to try the patient on losartan 25 mg a day and recheck the patient's blood pressure and renal function at his next INR check in 4 to 6 weeks.  Will monitor his renal function and potassium at that time.  Hopefully the patient can tolerate a low-dose angiotensin receptor blocker which I think would be very beneficial in preventing his kidney function from deteriorating further.  I will make no changes in his Coumadin dosage and recheck INR in 4 to 6 weeks.  09/06/17 Patient is here today to recheck his Coumadin.  His INR is therapeutic at 2.4.  He denies  any bleeding or bruising.  At his last visit, we started him on losartan 25 mg a day due to elevated blood pressure and for renal protection from diabetes mellitus.  He is tolerating the medication without difficulty.  He states his blood pressure at home is typically in the 130s over 80s.  Here today is elevated at 150/80.  He has been out of his insulin for approximately 1 month.  He is not checking his sugars.  He denies any polyuria, polydipsia, or blurry vision.  He denies any chest pain shortness of breath or dyspnea on exertion. Past Medical History:  Diagnosis Date  . Arthritis   . Atrial fibrillation ([place])   . Cardiomyopathy   . Cerebrovascular accident  ([place])    2012  . Diabetes mellitus   . Hyperlipidemia   . Hypertension   . Left-sided weakness   . 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 Zigmund Daniel, laser therapy OD  . Seizures ([place])    pt reports this is from low blood sugar   Past Surgical History:  Procedure Laterality Date  . Ear surgery as a child    . TONSILLECTOMY     Current Outpatient Medications on File Prior to Visit  Medication Sig Dispense Refill  . amLODipine (NORVASC) 10 MG tablet TAKE 1 TABLET BY MOUTH EVERY DAY 90 tablet 1  . atorvastatin (LIPITOR) 20 MG tablet Take 1 tablet (20 mg total) daily by mouth. 90 tablet 3  . carvedilol (COREG) 25 MG tablet TAKE 1 TABLET BY MOUTH TWICE A DAY WITH A MEAL 180 tablet 1  . cyclobenzaprine (FLEXERIL) 10 MG tablet Take 1 tablet (10 mg total) by mouth 3 (three) times daily as needed for muscle spasms. 30 tablet 0  . hydrALAZINE (APRESOLINE) 50 MG tablet TAKE 1 TABLET BY MOUTH THREE TIMES A DAY 270 tablet 1  . HYDROcodone-acetaminophen (NORCO) 5-325 MG tablet Take 1 tablet by mouth every 6 (six) hours as needed for moderate pain. 20 tablet 0  . Insulin Detemir (LEVEMIR FLEXTOUCH) 100 UNIT/ML Pen INJECT 70 UNITS UNDER THE SKIN EVERY MORNING AND 40 UNITS AT BEDTIME 135 mL 2  . Insulin Pen Needle 31G X 5 MM MISC Use daily with insulin 100 each 5  . Insulin Syringe-Needle U-100 (INSULIN SYRINGE .3CC/31GX5/16") 31G X 5/16" 0.3 ML MISC Use twice daily to inject insulin. 100 each 10  . ketoconazole (NIZORAL) 2 % shampoo Apply 1 application topically 2 (two) times a week. 120 mL 11  . losartan (COZAAR) 25 MG tablet Take 1 tablet (25 mg total) by mouth daily. 90 tablet 3  . metFORMIN (GLUCOPHAGE) 850 MG tablet Take 1 tablet (850 mg total) by mouth 2 (two) times daily with a meal. 180 tablet 5  . ONE TOUCH ULTRA TEST test strip CHECK BLOOD SUGAR FOUR TIMES DAILY 400 each 3  . warfarin (COUMADIN) 10 MG tablet TAKE 1 TABLET BY MOUTH EVERY DAY EXCEPT 1/2 TAB ON FRIDAYS AND MONDAYS 90  tablet 3   No current facility-administered medications on file prior to visit.    Allergies  Allergen Reactions  . Codeine Other (See Comments)    Unknown reaction, severe headach   Social History   Socioeconomic History  . Marital status: Divorced    Spouse name: Not on file  . Number of children: 2  . Years of education: Not on file  . Highest education level: Not on file  Occupational History  . Occupation: Disabled  Social Needs  .  Financial resource strain: Not on file  . Food insecurity:    Worry: Not on file    Inability: Not on file  . Transportation needs:    Medical: Not on file    Non-medical: Not on file  Tobacco Use  . Smoking status: Never Smoker  . Smokeless tobacco: Never Used  Substance and Sexual Activity  . Alcohol use: Yes    Alcohol/week: 0.0 oz    Comment: seldom  . Drug use: Yes    Frequency: 7.0 times per week    Comment: marijauna for arthritis  . Sexual activity: Not on file  Lifestyle  . Physical activity:    Days per week: Not on file    Minutes per session: Not on file  . Stress: Not on file  Relationships  . Social connections:    Talks on phone: Not on file    Gets together: Not on file    Attends religious service: Not on file    Active member of club or organization: Not on file    Attends meetings of clubs or organizations: Not on file    Relationship status: Not on file  . Intimate partner violence:    Fear of current or ex partner: Not on file    Emotionally abused: Not on file    Physically abused: Not on file    Forced sexual activity: Not on file  Other Topics Concern  . Not on file  Social History Narrative   He has children.    Review of Systems  All other systems reviewed and are negative.      Objective:   Physical Exam  Cardiovascular: Normal rate, regular rhythm and normal heart sounds.  Pulmonary/Chest: Effort normal and breath sounds normal. No respiratory distress. He has no wheezes. He has no  rales. He exhibits no tenderness.  Abdominal: Soft. Bowel sounds are normal. He exhibits no distension. There is no tenderness. There is no rebound.  Musculoskeletal: He exhibits no edema.  Vitals reviewed.         Assessment & Plan:  Atrial fibrillation, unspecified type ([place]) - Plan: PT with INR/Fingerstick  Essential hypertension, benign - Plan: BASIC METABOLIC PANEL WITH GFR  Controlled type 2 diabetes mellitus with complication, with long-term current use of insulin (HCC) - Plan: Hemoglobin A1c, CBC with Differential/Platelet, Lipid panel, Microalbumin, urine  Patient's INR is therapeutic.  I will make no changes in his Coumadin dose and I would recheck his INR in 6 weeks.  There is room for improvement in his blood pressure.  I will check a BMP.  If his renal function and potassium are tolerating the losartan recommend increasing losartan to 50 mg a day.  I will also check a hemoglobin A1c as long with a fasting lipid panel.  I suspect that his hemoglobin A1c is going to be elevated greater than 7.4 where it was previously due to the fact he has been out of insulin for 1 month.  I gave the patient all the samples of basaglar that I had.  He can continue to use 70 units a day of this.

## 2017-09-09 LAB — CBC WITH DIFFERENTIAL/PLATELET
BASOS PCT: 0.4 %
Basophils Absolute: 31 cells/uL (ref 0–200)
Eosinophils Absolute: 77 cells/uL (ref 15–500)
Eosinophils Relative: 1 %
HEMATOCRIT: 38.4 % — AB (ref 38.5–50.0)
Hemoglobin: 13.2 g/dL (ref 13.2–17.1)
LYMPHS ABS: 1833 {cells}/uL (ref 850–3900)
MCH: 29.2 pg (ref 27.0–33.0)
MCHC: 34.4 g/dL (ref 32.0–36.0)
MCV: 85 fL (ref 80.0–100.0)
MPV: 9.9 fL (ref 7.5–12.5)
Monocytes Relative: 7.8 %
Neutro Abs: 5159 cells/uL (ref 1500–7800)
Neutrophils Relative %: 67 %
Platelets: 195 10*3/uL (ref 140–400)
RBC: 4.52 10*6/uL (ref 4.20–5.80)
RDW: 13.3 % (ref 11.0–15.0)
Total Lymphocyte: 23.8 %
WBC: 7.7 10*3/uL (ref 3.8–10.8)
WBCMIX: 601 {cells}/uL (ref 200–950)

## 2017-09-09 LAB — HEMOGLOBIN A1C
Hgb A1c MFr Bld: 7.7 % of total Hgb — ABNORMAL HIGH (ref <5.7)
Mean Plasma Glucose: 174 (calc)
eAG (mmol/L): 9.7 (calc)

## 2017-09-09 LAB — MICROALBUMIN, URINE

## 2017-09-09 LAB — BASIC METABOLIC PANEL WITH GFR
BUN / CREAT RATIO: 18 (calc) (ref 6–22)
BUN: 24 mg/dL (ref 7–25)
CO2: 25 mmol/L (ref 20–32)
CREATININE: 1.3 mg/dL — AB (ref 0.70–1.25)
Calcium: 9.1 mg/dL (ref 8.6–10.3)
Chloride: 106 mmol/L (ref 98–110)
GFR, Est African American: 67 mL/min/{1.73_m2} (ref >=60)
GFR, Est Non African American: 58 mL/min/{1.73_m2} — ABNORMAL LOW (ref >=60)
GLUCOSE: 168 mg/dL — AB (ref 65–99)
Potassium: 4.6 mmol/L (ref 3.5–5.3)
Sodium: 138 mmol/L (ref 135–146)

## 2017-09-09 LAB — LIPID PANEL
CHOL/HDL RATIO: 4.5 (calc) (ref <5.0)
CHOLESTEROL: 140 mg/dL (ref <200)
HDL: 31 mg/dL — AB (ref >40)
LDL CHOLESTEROL (CALC): 79 mg/dL
NON-HDL CHOLESTEROL (CALC): 109 mg/dL (ref <130)
Triglycerides: 208 mg/dL — ABNORMAL HIGH (ref <150)

## 2017-09-10 ENCOUNTER — Other Ambulatory Visit: Payer: Self-pay | Admitting: Family Medicine

## 2017-09-10 DIAGNOSIS — I4891 Unspecified atrial fibrillation: Secondary | ICD-10-CM

## 2017-09-10 DIAGNOSIS — Z79899 Other long term (current) drug therapy: Secondary | ICD-10-CM

## 2017-09-10 DIAGNOSIS — E119 Type 2 diabetes mellitus without complications: Secondary | ICD-10-CM

## 2017-09-10 DIAGNOSIS — I10 Essential (primary) hypertension: Secondary | ICD-10-CM

## 2017-09-10 DIAGNOSIS — Z794 Long term (current) use of insulin: Secondary | ICD-10-CM

## 2017-09-10 DIAGNOSIS — E7800 Pure hypercholesterolemia, unspecified: Secondary | ICD-10-CM

## 2017-09-18 ENCOUNTER — Other Ambulatory Visit: Payer: Self-pay | Admitting: Family Medicine

## 2017-09-18 DIAGNOSIS — R69 Illness, unspecified: Secondary | ICD-10-CM | POA: Diagnosis not present

## 2017-09-18 MED ORDER — GLUCOSE BLOOD VI STRP: ORAL_STRIP | 3 refills | Status: DC

## 2017-10-18 ENCOUNTER — Ambulatory Visit: Payer: Medicare HMO | Admitting: Family Medicine

## 2017-10-29 ENCOUNTER — Ambulatory Visit (INDEPENDENT_AMBULATORY_CARE_PROVIDER_SITE_OTHER): Payer: Medicare HMO | Admitting: Family Medicine

## 2017-10-29 ENCOUNTER — Encounter: Payer: Self-pay | Admitting: Family Medicine

## 2017-10-29 VITALS — BP 122/74 | HR 78 | Temp 98.2°F | Resp 16 | Ht 69.0 in | Wt 195.0 lb

## 2017-10-29 DIAGNOSIS — I4891 Unspecified atrial fibrillation: Secondary | ICD-10-CM

## 2017-10-29 DIAGNOSIS — Z23 Encounter for immunization: Secondary | ICD-10-CM | POA: Diagnosis not present

## 2017-10-29 LAB — PT WITH INR/FINGERSTICK
INR, fingerstick: 2.1 ratio — ABNORMAL HIGH
PT, fingerstick: 25.5 s — ABNORMAL HIGH (ref 10.5–13.1)

## 2017-10-29 MED ORDER — KETOCONAZOLE 2 % EX SHAM: 1.0000 "application " | MEDICATED_SHAMPOO | CUTANEOUS | 11 refills | Status: DC

## 2017-10-29 NOTE — Addendum Note (Signed)
Addended by: Shary Decamp B on: 10/29/2017 09:27 AM   Modules accepted: Orders

## 2017-10-29 NOTE — Progress Notes (Signed)
Subjective:    Patient ID: Brian [name], male    DOB: Nov 26, 1955, 62 y.o.   MRN: [id number]   Patient is here today to recheck his Coumadin.  His INR is therapeutic at 2.1.  He denies any bleeding or bruising.  He is currently taking Coumadin 10 mg a day and seems to be tolerating it well without difficulty.  His blood pressure is better controlled on losartan.  He is asking for refill on ketoconazole/Nizoral shampoo which he uses for seborrheic dermatitis in his beard, and his eyebrows, and in his naso-labial folds. Past Medical History:  Diagnosis Date  . Arthritis   . Atrial fibrillation ([place])   . Cardiomyopathy   . Cerebrovascular accident ([place])    2012  . Diabetes mellitus   . Hyperlipidemia   . Hypertension   . Left-sided weakness   . 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 Zigmund Daniel, laser therapy OD  . Seizures ([place])    pt reports this is from low blood sugar   Past Surgical History:  Procedure Laterality Date  . Ear surgery as a child    . TONSILLECTOMY     Current Outpatient Medications on File Prior to Visit  Medication Sig Dispense Refill  . amLODipine (NORVASC) 10 MG tablet TAKE 1 TABLET BY MOUTH EVERY DAY 90 tablet 1  . atorvastatin (LIPITOR) 20 MG tablet Take 1 tablet (20 mg total) daily by mouth. 90 tablet 3  . carvedilol (COREG) 25 MG tablet TAKE 1 TABLET BY MOUTH TWICE A DAY WITH A MEAL 180 tablet 1  . glucose blood (ONE TOUCH ULTRA TEST) test strip CHECK BLOOD SUGAR FOUR TIMES DAILY (e11.9) 400 each 3  . hydrALAZINE (APRESOLINE) 50 MG tablet TAKE 1 TABLET BY MOUTH THREE TIMES A DAY 270 tablet 1  . HYDROcodone-acetaminophen (NORCO) 5-325 MG tablet Take 1 tablet by mouth every 6 (six) hours as needed for moderate pain. 20 tablet 0  . Insulin Detemir (LEVEMIR FLEXTOUCH) 100 UNIT/ML Pen INJECT 70 UNITS UNDER THE SKIN EVERY MORNING AND 40 UNITS AT BEDTIME 135 mL 2  . Insulin Pen Needle 31G X 5 MM MISC Use daily with insulin 100 each 5  . Insulin  Syringe-Needle U-100 (INSULIN SYRINGE .3CC/31GX5/16") 31G X 5/16" 0.3 ML MISC Use twice daily to inject insulin. 100 each 10  . ketoconazole (NIZORAL) 2 % shampoo Apply 1 application topically 2 (two) times a week. 120 mL 11  . losartan (COZAAR) 25 MG tablet Take 1 tablet (25 mg total) by mouth daily. 90 tablet 3  . metFORMIN (GLUCOPHAGE) 850 MG tablet Take 1 tablet (850 mg total) by mouth 2 (two) times daily with a meal. 180 tablet 5  . warfarin (COUMADIN) 10 MG tablet TAKE 1 TABLET BY MOUTH EVERY DAY EXCEPT 1/2 TAB ON FRIDAYS AND MONDAYS (Patient taking differently: Take 10 mg by mouth daily. ) 90 tablet 3  . cyclobenzaprine (FLEXERIL) 10 MG tablet Take 1 tablet (10 mg total) by mouth 3 (three) times daily as needed for muscle spasms. (Patient not taking: Reported on 09/06/2017) 30 tablet 0   No current facility-administered medications on file prior to visit.    Allergies  Allergen Reactions  . Codeine Other (See Comments)    Unknown reaction, severe headach   Social History   Socioeconomic History  . Marital status: Divorced    Spouse name: Not on file  . Number of children: 2  . Years of education: Not on file  .  Highest education level: Not on file  Occupational History  . Occupation: Disabled  Social Needs  . Financial resource strain: Not on file  . Food insecurity:    Worry: Not on file    Inability: Not on file  . Transportation needs:    Medical: Not on file    Non-medical: Not on file  Tobacco Use  . Smoking status: Never Smoker  . Smokeless tobacco: Never Used  Substance and Sexual Activity  . Alcohol use: Yes    Alcohol/week: 0.0 standard drinks    Comment: seldom  . Drug use: Yes    Frequency: 7.0 times per week    Comment: marijauna for arthritis  . Sexual activity: Not on file  Lifestyle  . Physical activity:    Days per week: Not on file    Minutes per session: Not on file  . Stress: Not on file  Relationships  . Social connections:    Talks on  phone: Not on file    Gets together: Not on file    Attends religious service: Not on file    Active member of club or organization: Not on file    Attends meetings of clubs or organizations: Not on file    Relationship status: Not on file  . Intimate partner violence:    Fear of current or ex partner: Not on file    Emotionally abused: Not on file    Physically abused: Not on file    Forced sexual activity: Not on file  Other Topics Concern  . Not on file  Social History Narrative   He has children.    Review of Systems  All other systems reviewed and are negative.      Objective:   Physical Exam  Cardiovascular: Normal rate, regular rhythm and normal heart sounds.  Pulmonary/Chest: Effort normal and breath sounds normal. No respiratory distress. He has no wheezes. He has no rales. He exhibits no tenderness.  Abdominal: Soft. Bowel sounds are normal. He exhibits no distension. There is no tenderness. There is no rebound.  Musculoskeletal: He exhibits no edema.  Vitals reviewed.         Assessment & Plan:  Atrial fibrillation, unspecified type ([place]) - Plan: PT with INR/Fingerstick  Patient's INR is therapeutic.  I will make no changes in his Coumadin dose and I would recheck his INR in 6 weeks.  He received his flu shot today

## 2017-11-01 ENCOUNTER — Other Ambulatory Visit: Payer: Self-pay | Admitting: Family Medicine

## 2017-11-16 ENCOUNTER — Other Ambulatory Visit: Payer: Self-pay | Admitting: Family Medicine

## 2017-11-21 ENCOUNTER — Other Ambulatory Visit: Payer: Self-pay | Admitting: Family Medicine

## 2017-12-10 ENCOUNTER — Encounter: Payer: Self-pay | Admitting: Family Medicine

## 2017-12-10 ENCOUNTER — Ambulatory Visit (INDEPENDENT_AMBULATORY_CARE_PROVIDER_SITE_OTHER): Payer: Medicare HMO | Admitting: Family Medicine

## 2017-12-10 VITALS — BP 140/80 | HR 68 | Temp 98.3°F | Resp 18 | Ht 69.0 in | Wt 200.0 lb

## 2017-12-10 DIAGNOSIS — Z794 Long term (current) use of insulin: Secondary | ICD-10-CM

## 2017-12-10 DIAGNOSIS — E118 Type 2 diabetes mellitus with unspecified complications: Secondary | ICD-10-CM

## 2017-12-10 DIAGNOSIS — I10 Essential (primary) hypertension: Secondary | ICD-10-CM

## 2017-12-10 DIAGNOSIS — I4891 Unspecified atrial fibrillation: Secondary | ICD-10-CM

## 2017-12-10 DIAGNOSIS — E7800 Pure hypercholesterolemia, unspecified: Secondary | ICD-10-CM | POA: Diagnosis not present

## 2017-12-10 LAB — PT WITH INR/FINGERSTICK
INR FINGERSTICK: 2.3 ratio — AB
PT, fingerstick: 27.9 s — ABNORMAL HIGH (ref 10.5–13.1)

## 2017-12-10 NOTE — Progress Notes (Signed)
Subjective:    Patient ID: Brian [name], male    DOB: 30-Mar-1955, 62 y.o.   MRN: [id number]   Patient is here today to recheck his Coumadin.  His INR today is therapeutic.  He denies any bleeding or bruising.  However he is overdue to recheck his diabetes.  His blood pressure today is excellent at 140/80.  He denies any chest pain shortness of breath or dyspnea on exertion.  He denies any polyuria, polydipsia, or blurry vision.  He denies any neuropathy in his feet.  Diabetic foot exam is performed today and is normal.  He is currently taking Levemir 70 units every morning in addition to metformin.  His medicine list list 40 units in the evening of Levemir however he states that he does this seldomly.  He estimates that he takes the additional 40 units less than once a week.  He states that he only does this when he eats terribly throughout the day and his sugars are running high.  He denies any hypoglycemic episodes.  He states for the most part 70 units of Levemir is managing his sugars however he has no visible sugars for me to review.  He denies any blood sugar greater than 200. Past Medical History:  Diagnosis Date  . Arthritis   . Atrial fibrillation ([place])   . Cardiomyopathy   . Cerebrovascular accident ([place])    2012  . Diabetes mellitus   . Hyperlipidemia   . Hypertension   . Left-sided weakness   . 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 Zigmund Daniel, laser therapy OD  . Seizures ([place])    pt reports this is from low blood sugar   Past Surgical History:  Procedure Laterality Date  . Ear surgery as a child    . TONSILLECTOMY     Current Outpatient Medications on File Prior to Visit  Medication Sig Dispense Refill  . amLODipine (NORVASC) 10 MG tablet TAKE 1 TABLET BY MOUTH EVERY DAY 90 tablet 1  . atorvastatin (LIPITOR) 20 MG tablet Take 1 tablet (20 mg total) daily by mouth. 90 tablet 3  . carvedilol (COREG) 25 MG tablet TAKE 1 TABLET BY MOUTH TWICE A DAY WITH A MEAL  180 tablet 3  . glucose blood (ONE TOUCH ULTRA TEST) test strip CHECK BLOOD SUGAR FOUR TIMES DAILY (e11.9) 400 each 3  . hydrALAZINE (APRESOLINE) 50 MG tablet TAKE 1 TABLET BY MOUTH THREE TIMES A DAY 270 tablet 1  . HYDROcodone-acetaminophen (NORCO) 5-325 MG tablet Take 1 tablet by mouth every 6 (six) hours as needed for moderate pain. 20 tablet 0  . Insulin Pen Needle 31G X 5 MM MISC Use daily with insulin 100 each 5  . Insulin Syringe-Needle U-100 (INSULIN SYRINGE .3CC/31GX5/16") 31G X 5/16" 0.3 ML MISC Use twice daily to inject insulin. 100 each 10  . ketoconazole (NIZORAL) 2 % shampoo Apply 1 application topically 2 (two) times a week. 120 mL 11  . LEVEMIR FLEXTOUCH 100 UNIT/ML Pen INJECT 70 UNITS UNDER THE SKIN EVERY MORNING AND 40 UNITS AT BEDTIME 15 mL 3  . losartan (COZAAR) 25 MG tablet Take 1 tablet (25 mg total) by mouth daily. 90 tablet 3  . metFORMIN (GLUCOPHAGE) 850 MG tablet Take 1 tablet (850 mg total) by mouth 2 (two) times daily with a meal. 180 tablet 5  . warfarin (COUMADIN) 10 MG tablet TAKE 1 TABLET BY MOUTH EVERY DAY EXCEPT 1/2 TAB ON FRIDAYS AND MONDAYS (Patient taking  differently: Take 10 mg by mouth daily. ) 90 tablet 3   No current facility-administered medications on file prior to visit.    Allergies  Allergen Reactions  . Codeine Other (See Comments)    Unknown reaction, severe headach   Social History   Socioeconomic History  . Marital status: Divorced    Spouse name: Not on file  . Number of children: 2  . Years of education: Not on file  . Highest education level: Not on file  Occupational History  . Occupation: Disabled  Social Needs  . Financial resource strain: Not on file  . Food insecurity:    Worry: Not on file    Inability: Not on file  . Transportation needs:    Medical: Not on file    Non-medical: Not on file  Tobacco Use  . Smoking status: Never Smoker  . Smokeless tobacco: Never Used  Substance and Sexual Activity  . Alcohol use: Yes      Alcohol/week: 0.0 standard drinks    Comment: seldom  . Drug use: Yes    Frequency: 7.0 times per week    Comment: marijauna for arthritis  . Sexual activity: Not on file  Lifestyle  . Physical activity:    Days per week: Not on file    Minutes per session: Not on file  . Stress: Not on file  Relationships  . Social connections:    Talks on phone: Not on file    Gets together: Not on file    Attends religious service: Not on file    Active member of club or organization: Not on file    Attends meetings of clubs or organizations: Not on file    Relationship status: Not on file  . Intimate partner violence:    Fear of current or ex partner: Not on file    Emotionally abused: Not on file    Physically abused: Not on file    Forced sexual activity: Not on file  Other Topics Concern  . Not on file  Social History Narrative   He has children.    Review of Systems  All other systems reviewed and are negative.      Objective:   Physical Exam  Cardiovascular: Normal rate, regular rhythm and normal heart sounds.  Pulmonary/Chest: Effort normal and breath sounds normal. No respiratory distress. He has no wheezes. He has no rales. He exhibits no tenderness.  Abdominal: Soft. Bowel sounds are normal. He exhibits no distension. There is no tenderness. There is no rebound.  Musculoskeletal: He exhibits no edema.  Vitals reviewed.         Assessment & Plan:  Essential hypertension, benign  Atrial fibrillation, unspecified type ([place]) - Plan: PT with INR/Fingerstick  Controlled type 2 diabetes mellitus with complication, with long-term current use of insulin ([place]) - Plan: CBC with Differential/Platelet, COMPLETE METABOLIC PANEL WITH GFR, Lipid panel, Hemoglobin A1c  Pure hypercholesterolemia  Patient's INR is therapeutic.  I will make no changes in his Coumadin dose and I would recheck his INR in 6 weeks.  His flu shot is up-to-date.  His blood pressure is well controlled  at 140/80.  I will check a fasting lipid panel.  Ideally I like his LDL cholesterol well below 100.  I will also check a hemoglobin A1c.  Ideally I like his hemoglobin A1c less than 7.  I believe the reason the patient is not taking his extra 40 units of Levemir is due to cost.  Quite  often he cannot afford his insulin.  Therefore I believe he is attempting to ration his insulin as much as possible.  I will await the results of his A1c.  If less than 7, I will take the patient at his word that his sugars are under good control on just 70 units.  If greater than 7, we may need to consider switching him to 70/30 in an attempt to make his insulin more for affordable so that he can take it properly

## 2017-12-11 LAB — CBC WITH DIFFERENTIAL/PLATELET
BASOS PCT: 0.5 %
Basophils Absolute: 37 cells/uL (ref 0–200)
EOS ABS: 96 {cells}/uL (ref 15–500)
Eosinophils Relative: 1.3 %
HCT: 40.7 % (ref 38.5–50.0)
HEMOGLOBIN: 13.7 g/dL (ref 13.2–17.1)
Lymphs Abs: 1761 cells/uL (ref 850–3900)
MCH: 29.5 pg (ref 27.0–33.0)
MCHC: 33.7 g/dL (ref 32.0–36.0)
MCV: 87.5 fL (ref 80.0–100.0)
MONOS PCT: 10 %
MPV: 9.7 fL (ref 7.5–12.5)
NEUTROS ABS: 4766 {cells}/uL (ref 1500–7800)
Neutrophils Relative %: 64.4 %
PLATELETS: 208 10*3/uL (ref 140–400)
RBC: 4.65 10*6/uL (ref 4.20–5.80)
RDW: 13 % (ref 11.0–15.0)
TOTAL LYMPHOCYTE: 23.8 %
WBC mixed population: 740 cells/uL (ref 200–950)
WBC: 7.4 10*3/uL (ref 3.8–10.8)

## 2017-12-11 LAB — COMPLETE METABOLIC PANEL WITH GFR
AG RATIO: 2 (calc) (ref 1.0–2.5)
ALBUMIN MSPROF: 4.5 g/dL (ref 3.6–5.1)
ALT: 19 U/L (ref 9–46)
AST: 14 U/L (ref 10–35)
Alkaline phosphatase (APISO): 52 U/L (ref 40–115)
BILIRUBIN TOTAL: 0.5 mg/dL (ref 0.2–1.2)
BUN / CREAT RATIO: 14 (calc) (ref 6–22)
BUN: 19 mg/dL (ref 7–25)
CHLORIDE: 106 mmol/L (ref 98–110)
CO2: 26 mmol/L (ref 20–32)
Calcium: 9.1 mg/dL (ref 8.6–10.3)
Creat: 1.35 mg/dL — ABNORMAL HIGH (ref 0.70–1.25)
GFR, EST AFRICAN AMERICAN: 64 mL/min/{1.73_m2} (ref >=60)
GFR, Est Non African American: 55 mL/min/{1.73_m2} — ABNORMAL LOW (ref >=60)
GLOBULIN: 2.2 g/dL (ref 1.9–3.7)
Glucose, Bld: 85 mg/dL (ref 65–99)
POTASSIUM: 4.5 mmol/L (ref 3.5–5.3)
SODIUM: 140 mmol/L (ref 135–146)
TOTAL PROTEIN: 6.7 g/dL (ref 6.1–8.1)

## 2017-12-11 LAB — LIPID PANEL
CHOL/HDL RATIO: 4.8 (calc) (ref <5.0)
Cholesterol: 159 mg/dL (ref <200)
HDL: 33 mg/dL — ABNORMAL LOW (ref >40)
LDL Cholesterol (Calc): 101 mg/dL (calc) — ABNORMAL HIGH
NON-HDL CHOLESTEROL (CALC): 126 mg/dL (ref <130)
Triglycerides: 149 mg/dL (ref <150)

## 2017-12-11 LAB — HEMOGLOBIN A1C
EAG (MMOL/L): 9.8 (calc)
Hgb A1c MFr Bld: 7.8 % of total Hgb — ABNORMAL HIGH (ref <5.7)
MEAN PLASMA GLUCOSE: 177 (calc)

## 2017-12-18 ENCOUNTER — Other Ambulatory Visit: Payer: Self-pay | Admitting: Family Medicine

## 2017-12-20 ENCOUNTER — Encounter: Payer: Self-pay | Admitting: *Deleted

## 2018-01-06 ENCOUNTER — Other Ambulatory Visit: Payer: Self-pay | Admitting: Family Medicine

## 2018-01-09 ENCOUNTER — Other Ambulatory Visit: Payer: Self-pay | Admitting: Family Medicine

## 2018-01-21 ENCOUNTER — Encounter: Payer: Self-pay | Admitting: Family Medicine

## 2018-01-21 ENCOUNTER — Ambulatory Visit (INDEPENDENT_AMBULATORY_CARE_PROVIDER_SITE_OTHER): Payer: Medicare HMO | Admitting: Family Medicine

## 2018-01-21 ENCOUNTER — Other Ambulatory Visit: Payer: Self-pay

## 2018-01-21 VITALS — BP 150/88 | HR 76 | Temp 98.0°F | Resp 16 | Ht 69.0 in | Wt 203.0 lb

## 2018-01-21 DIAGNOSIS — I4891 Unspecified atrial fibrillation: Secondary | ICD-10-CM

## 2018-01-21 LAB — PT WITH INR/FINGERSTICK
INR, fingerstick: 2.1 ratio — ABNORMAL HIGH
PT FINGERSTICK: 25.2 s — AB (ref 10.5–13.1)

## 2018-01-21 NOTE — Progress Notes (Signed)
Subjective:    Patient ID: Brian [name], male    DOB: [birth year], 62 y.o.   MRN: [id number]   Patient is here today to recheck his Coumadin.  Currently on 10 mg a day.  His INR today is 2.1.  He denies any bleeding or bruising.  On his last lab work, his hemoglobin A1c was 7.8.  He is taking metformin twice a day every day.  His compliance with insulin has been less than 100% primarily due to cost. Past Surgical History:  Procedure Laterality Date  . Ear surgery as a child    . TONSILLECTOMY     Current Outpatient Medications on File Prior to Visit  Medication Sig Dispense Refill  . amLODipine (NORVASC) 10 MG tablet TAKE 1 TABLET BY MOUTH EVERY DAY 90 tablet 1  . atorvastatin (LIPITOR) 20 MG tablet Take 1 tablet (20 mg total) daily by mouth. 90 tablet 3  . atorvastatin (LIPITOR) 20 MG tablet TAKE 1 TABLET (20 MG TOTAL) DAILY BY MOUTH. 90 tablet 3  . carvedilol (COREG) 25 MG tablet TAKE 1 TABLET BY MOUTH TWICE A DAY WITH A MEAL 180 tablet 3  . glucose blood (ONE TOUCH ULTRA TEST) test strip CHECK BLOOD SUGAR FOUR TIMES DAILY (e11.9) 400 each 3  . hydrALAZINE (APRESOLINE) 50 MG tablet TAKE 1 TABLET BY MOUTH THREE TIMES A DAY 270 tablet 1  . HYDROcodone-acetaminophen (NORCO) 5-325 MG tablet Take 1 tablet by mouth every 6 (six) hours as needed for moderate pain. 20 tablet 0  . Insulin Pen Needle 31G X 5 MM MISC Use daily with insulin 100 each 5  . Insulin Syringe-Needle U-100 (INSULIN SYRINGE .3CC/31GX5/16") 31G X 5/16" 0.3 ML MISC Use twice daily to inject insulin. 100 each 10  . ketoconazole (NIZORAL) 2 % shampoo Apply 1 application topically 2 (two) times a week. 120 mL 11  . LEVEMIR FLEXTOUCH 100 UNIT/ML Pen INJECT 70 UNITS UNDER THE SKIN EVERY MORNING AND 40 UNITS AT BEDTIME 15 mL 3  . losartan (COZAAR) 25 MG tablet Take 1 tablet (25 mg total) by mouth daily. 90 tablet 3  . metFORMIN (GLUCOPHAGE) 850 MG tablet TAKE 1 TABLET BY MOUTH TWICE A DAY WITH MEALS 180 tablet 5  . warfarin  (COUMADIN) 10 MG tablet TAKE 1 TABLET BY MOUTH EVERY DAY EXCEPT 1/2 TAB ON FRIDAYS AND MONDAYS (Patient taking differently: Take 10 mg by mouth daily. ) 90 tablet 3   No current facility-administered medications on file prior to visit.    Allergies  Allergen Reactions  . Codeine Other (See Comments)    Unknown reaction, severe headach   Social History   Socioeconomic History  . Marital status: Divorced    Spouse name: Not on file  . Number of children: 2  . Years of education: Not on file  . Highest education level: Not on file  Occupational History  . Occupation: Disabled  Social Needs  . Financial resource strain: Not on file  . Food insecurity:    Worry: Not on file    Inability: Not on file  . Transportation needs:    Medical: Not on file    Non-medical: Not on file  Tobacco Use  . Smoking status: Never Smoker  . Smokeless tobacco: Never Used  Substance and Sexual Activity  . Alcohol use: Yes    Alcohol/week: 0.0 standard drinks    Comment: seldom  . Drug use: Yes    Frequency: 7.0 times per week  Comment: marijauna for arthritis  . Sexual activity: Not on file  Lifestyle  . Physical activity:    Days per week: Not on file    Minutes per session: Not on file  . Stress: Not on file  Relationships  . Social connections:    Talks on phone: Not on file    Gets together: Not on file    Attends religious service: Not on file    Active member of club or organization: Not on file    Attends meetings of clubs or organizations: Not on file    Relationship status: Not on file  . Intimate partner violence:    Fear of current or ex partner: Not on file    Emotionally abused: Not on file    Physically abused: Not on file    Forced sexual activity: Not on file  Other Topics Concern  . Not on file  Social History Narrative   He has children.    Review of Systems  All other systems reviewed and are negative.      Objective:   Physical Exam  Cardiovascular:  Normal rate, regular rhythm and normal heart sounds.  Pulmonary/Chest: Effort normal and breath sounds normal. No respiratory distress. He has no wheezes. He has no rales. He exhibits no tenderness.  Abdominal: Soft. Bowel sounds are normal. He exhibits no distension. There is no tenderness. There is no rebound.  Musculoskeletal: He exhibits no edema.  Vitals reviewed.         Assessment & Plan:  Atrial fibrillation, unspecified type ([place]) - Plan: PT with INR/Fingerstick  Patient's INR is therapeutic.  I will make no changes in his Coumadin dose and I would recheck his INR in 6 weeks.  I had a discussion today with the patient about possibly adding Invokana or Jardiance given her cardiovascular data to what he is taking.  I explained to the patient the both of these medications have indications to reduce all cause death due to cardiovascular problems in the knee recent indication Invokana has received to prevent progression of diabetic kidney disease and reduce mortality.  Patient would like to check on the price first prior to starting any of these medications.

## 2018-02-10 ENCOUNTER — Other Ambulatory Visit: Payer: Self-pay | Admitting: Family Medicine

## 2018-03-07 ENCOUNTER — Ambulatory Visit (INDEPENDENT_AMBULATORY_CARE_PROVIDER_SITE_OTHER): Payer: Medicare HMO | Admitting: Family Medicine

## 2018-03-07 ENCOUNTER — Encounter: Payer: Self-pay | Admitting: Family Medicine

## 2018-03-07 VITALS — BP 170/100 | HR 76 | Temp 98.2°F | Resp 18 | Ht 69.0 in | Wt 203.0 lb

## 2018-03-07 DIAGNOSIS — I4891 Unspecified atrial fibrillation: Secondary | ICD-10-CM | POA: Diagnosis not present

## 2018-03-07 DIAGNOSIS — I10 Essential (primary) hypertension: Secondary | ICD-10-CM | POA: Diagnosis not present

## 2018-03-07 LAB — PT WITH INR/FINGERSTICK
INR FINGERSTICK: 1.6 ratio — AB
PT FINGERSTICK: 19 s — AB (ref 10.5–13.1)

## 2018-03-07 NOTE — Progress Notes (Signed)
Subjective:    Patient ID: Brian [name], male    DOB: 12/07/1955, 62 y.o.   MRN: [id number]   Patient is here today to recheck his Coumadin.  Currently on 10 mg a day.  His INR today is 1.6.  He missed his blood pressure medication yesterday along with his Coumadin.  He has not yet taken his blood pressure medicine today.  His blood pressure is elevated at 170/100 but he denies any chest pain shortness of breath or dyspnea on exertion Past Surgical History:  Procedure Laterality Date  . Ear surgery as a child    . TONSILLECTOMY     Current Outpatient Medications on File Prior to Visit  Medication Sig Dispense Refill  . amLODipine (NORVASC) 10 MG tablet TAKE 1 TABLET BY MOUTH EVERY DAY 90 tablet 1  . atorvastatin (LIPITOR) 20 MG tablet Take 1 tablet (20 mg total) daily by mouth. 90 tablet 3  . carvedilol (COREG) 25 MG tablet TAKE 1 TABLET BY MOUTH TWICE A DAY WITH A MEAL 180 tablet 3  . glucose blood (ONE TOUCH ULTRA TEST) test strip CHECK BLOOD SUGAR FOUR TIMES DAILY (e11.9) 400 each 3  . hydrALAZINE (APRESOLINE) 50 MG tablet TAKE 1 TABLET BY MOUTH THREE TIMES A DAY 270 tablet 1  . HYDROcodone-acetaminophen (NORCO) 5-325 MG tablet Take 1 tablet by mouth every 6 (six) hours as needed for moderate pain. 20 tablet 0  . Insulin Pen Needle 31G X 5 MM MISC Use daily with insulin 100 each 5  . Insulin Syringe-Needle U-100 (INSULIN SYRINGE .3CC/31GX5/16") 31G X 5/16" 0.3 ML MISC Use twice daily to inject insulin. 100 each 10  . ketoconazole (NIZORAL) 2 % shampoo Apply 1 application topically 2 (two) times a week. 120 mL 11  . LEVEMIR FLEXTOUCH 100 UNIT/ML Pen INJECT 70 UNITS UNDER THE SKIN EVERY MORNING AND 40 UNITS AT BEDTIME 30 mL 1  . losartan (COZAAR) 25 MG tablet Take 1 tablet (25 mg total) by mouth daily. 90 tablet 3  . metFORMIN (GLUCOPHAGE) 850 MG tablet TAKE 1 TABLET BY MOUTH TWICE A DAY WITH MEALS 180 tablet 5  . warfarin (COUMADIN) 10 MG tablet TAKE 1 TABLET BY MOUTH EVERY DAY  EXCEPT 1/2 TAB ON FRIDAYS AND MONDAYS (Patient taking differently: Take 10 mg by mouth daily. ) 90 tablet 3   No current facility-administered medications on file prior to visit.    Allergies  Allergen Reactions  . Codeine Other (See Comments)    Unknown reaction, severe headach   Social History   Socioeconomic History  . Marital status: Divorced    Spouse name: Not on file  . Number of children: 2  . Years of education: Not on file  . Highest education level: Not on file  Occupational History  . Occupation: Disabled  Social Needs  . Financial resource strain: Not on file  . Food insecurity:    Worry: Not on file    Inability: Not on file  . Transportation needs:    Medical: Not on file    Non-medical: Not on file  Tobacco Use  . Smoking status: Never Smoker  . Smokeless tobacco: Never Used  Substance and Sexual Activity  . Alcohol use: Yes    Alcohol/week: 0.0 standard drinks    Comment: seldom  . Drug use: Yes    Frequency: 7.0 times per week    Comment: marijauna for arthritis  . Sexual activity: Not on file  Lifestyle  . Physical activity:  Days per week: Not on file    Minutes per session: Not on file  . Stress: Not on file  Relationships  . Social connections:    Talks on phone: Not on file    Gets together: Not on file    Attends religious service: Not on file    Active member of club or organization: Not on file    Attends meetings of clubs or organizations: Not on file    Relationship status: Not on file  . Intimate partner violence:    Fear of current or ex partner: Not on file    Emotionally abused: Not on file    Physically abused: Not on file    Forced sexual activity: Not on file  Other Topics Concern  . Not on file  Social History Narrative   He has children.    Review of Systems  All other systems reviewed and are negative.      Objective:   Physical Exam  Cardiovascular: Normal rate, regular rhythm and normal heart sounds.    Pulmonary/Chest: Effort normal and breath sounds normal. No respiratory distress. He has no wheezes. He has no rales. He exhibits no tenderness.  Abdominal: Soft. Bowel sounds are normal. He exhibits no distension. There is no abdominal tenderness. There is no rebound.  Musculoskeletal:        General: No edema.  Vitals reviewed.         Assessment & Plan:  Essential hypertension, benign  Atrial fibrillation, unspecified type ([place]) - Plan: PT with INR/Fingerstick  INR today is subtherapeutic at 1.6.  However the patient forgot his medicine yesterday including his blood pressure pills which also explains evaded blood pressure this morning.  He also has not taken his blood pressure pills yet this morning.  Of asked the patient to go home and take his blood pressure medicine immediately and then recheck his blood pressure later in the day.  Than 140/90, we may need to adjust his medication dose however I believe this is likely due to the fact he has not taken his medication.  Regarding his Coumadin I want him to take 15 mg today and then resume his previous dose of 10 mg a day and recheck an INR in 4 weeks

## 2018-04-18 ENCOUNTER — Ambulatory Visit (INDEPENDENT_AMBULATORY_CARE_PROVIDER_SITE_OTHER): Payer: Medicare HMO | Admitting: Family Medicine

## 2018-04-18 ENCOUNTER — Encounter: Payer: Self-pay | Admitting: Family Medicine

## 2018-04-18 VITALS — BP 142/74 | HR 72 | Temp 98.0°F | Resp 18 | Ht 69.0 in | Wt 201.0 lb

## 2018-04-18 DIAGNOSIS — Z794 Long term (current) use of insulin: Secondary | ICD-10-CM | POA: Diagnosis not present

## 2018-04-18 DIAGNOSIS — E118 Type 2 diabetes mellitus with unspecified complications: Secondary | ICD-10-CM | POA: Diagnosis not present

## 2018-04-18 DIAGNOSIS — I4891 Unspecified atrial fibrillation: Secondary | ICD-10-CM | POA: Diagnosis not present

## 2018-04-18 LAB — PT WITH INR/FINGERSTICK
INR, fingerstick: 2 ratio — ABNORMAL HIGH
PT, fingerstick: 24.5 s — ABNORMAL HIGH (ref 10.5–13.1)

## 2018-04-18 NOTE — Progress Notes (Signed)
Subjective:    Patient ID: Brian [name], male    DOB: [birth year], 63 y.o.   MRN: [id number]   Patient is here today to recheck his Coumadin.  Currently on 10 mg a day.  His INR today is 2.0.  Patient's INR is therapeutic.  Unfortunately, the patient has been out of medication/insulin for approximately 1 month.  He is unable to afford the co-pay which is over $300.  He has a high deductible plan apparently.  He is originally on Levemir 70 units in the morning 40 units in the evening which would be 110 units total Past Surgical History:  Procedure Laterality Date  . Ear surgery as a child    . TONSILLECTOMY     Current Outpatient Medications on File Prior to Visit  Medication Sig Dispense Refill  . amLODipine (NORVASC) 10 MG tablet TAKE 1 TABLET BY MOUTH EVERY DAY 90 tablet 1  . atorvastatin (LIPITOR) 20 MG tablet Take 1 tablet (20 mg total) daily by mouth. 90 tablet 3  . carvedilol (COREG) 25 MG tablet TAKE 1 TABLET BY MOUTH TWICE A DAY WITH A MEAL 180 tablet 3  . glucose blood (ONE TOUCH ULTRA TEST) test strip CHECK BLOOD SUGAR FOUR TIMES DAILY (e11.9) 400 each 3  . hydrALAZINE (APRESOLINE) 50 MG tablet TAKE 1 TABLET BY MOUTH THREE TIMES A DAY 270 tablet 1  . HYDROcodone-acetaminophen (NORCO) 5-325 MG tablet Take 1 tablet by mouth every 6 (six) hours as needed for moderate pain. 20 tablet 0  . Insulin Pen Needle 31G X 5 MM MISC Use daily with insulin 100 each 5  . Insulin Syringe-Needle U-100 (INSULIN SYRINGE .3CC/31GX5/16") 31G X 5/16" 0.3 ML MISC Use twice daily to inject insulin. 100 each 10  . ketoconazole (NIZORAL) 2 % shampoo Apply 1 application topically 2 (two) times a week. 120 mL 11  . LEVEMIR FLEXTOUCH 100 UNIT/ML Pen INJECT 70 UNITS UNDER THE SKIN EVERY MORNING AND 40 UNITS AT BEDTIME 30 mL 1  . losartan (COZAAR) 25 MG tablet Take 1 tablet (25 mg total) by mouth daily. 90 tablet 3  . metFORMIN (GLUCOPHAGE) 850 MG tablet TAKE 1 TABLET BY MOUTH TWICE A DAY WITH MEALS 180  tablet 5  . warfarin (COUMADIN) 10 MG tablet TAKE 1 TABLET BY MOUTH EVERY DAY EXCEPT 1/2 TAB ON FRIDAYS AND MONDAYS (Patient taking differently: Take 10 mg by mouth daily. ) 90 tablet 3   No current facility-administered medications on file prior to visit.    Allergies  Allergen Reactions  . Codeine Other (See Comments)    Unknown reaction, severe headach   Social History   Socioeconomic History  . Marital status: Divorced    Spouse name: Not on file  . Number of children: 2  . Years of education: Not on file  . Highest education level: Not on file  Occupational History  . Occupation: Disabled  Social Needs  . Financial resource strain: Not on file  . Food insecurity:    Worry: Not on file    Inability: Not on file  . Transportation needs:    Medical: Not on file    Non-medical: Not on file  Tobacco Use  . Smoking status: Never Smoker  . Smokeless tobacco: Never Used  Substance and Sexual Activity  . Alcohol use: Yes    Alcohol/week: 0.0 standard drinks    Comment: seldom  . Drug use: Yes    Frequency: 7.0 times per week    Comment:  marijauna for arthritis  . Sexual activity: Not on file  Lifestyle  . Physical activity:    Days per week: Not on file    Minutes per session: Not on file  . Stress: Not on file  Relationships  . Social connections:    Talks on phone: Not on file    Gets together: Not on file    Attends religious service: Not on file    Active member of club or organization: Not on file    Attends meetings of clubs or organizations: Not on file    Relationship status: Not on file  . Intimate partner violence:    Fear of current or ex partner: Not on file    Emotionally abused: Not on file    Physically abused: Not on file    Forced sexual activity: Not on file  Other Topics Concern  . Not on file  Social History Narrative   He has children.    Review of Systems  All other systems reviewed and are negative.      Objective:   Physical  Exam  Cardiovascular: Normal rate, regular rhythm and normal heart sounds.  Pulmonary/Chest: Effort normal and breath sounds normal. No respiratory distress. He has no wheezes. He has no rales. He exhibits no tenderness.  Abdominal: Soft. Bowel sounds are normal. He exhibits no distension. There is no abdominal tenderness. There is no rebound.  Musculoskeletal:        General: No edema.  Vitals reviewed.         Assessment & Plan:  Controlled type 2 diabetes mellitus with complication, with long-term current use of insulin ([place]) - Plan: Hemoglobin A1c, COMPLETE METABOLIC PANEL WITH GFR, CBC with Differential/Platelet, Lipid panel  Atrial fibrillation, unspecified type ([place]) - Plan: PT with INR/Fingerstick  INR is therapeutic.  Continue Coumadin 10 mg a day and recheck INR in 6 weeks.  I gave the patient samples of Toujeo.  I recommended he use 80 units once a day and monitor his blood sugar and gradually increase to 110 units once a day depending upon how he tolerates the insulin.  Check CBC, CMP, fasting lipid panel, and hemoglobin A1c.  Recommended the patient check with his insurance to see if there is any cheaper versions of insulin while he is on the samples.  Likely can switch the patient to 70/30 insulin which would be much more affordable for him

## 2018-04-19 LAB — CBC WITH DIFFERENTIAL/PLATELET
Absolute Monocytes: 564 cells/uL (ref 200–950)
BASOS ABS: 41 {cells}/uL (ref 0–200)
Basophils Relative: 0.6 %
EOS ABS: 68 {cells}/uL (ref 15–500)
EOS PCT: 1 %
HCT: 43.6 % (ref 38.5–50.0)
Hemoglobin: 14.4 g/dL (ref 13.2–17.1)
Lymphs Abs: 1442 cells/uL (ref 850–3900)
MCH: 28.2 pg (ref 27.0–33.0)
MCHC: 33 g/dL (ref 32.0–36.0)
MCV: 85.3 fL (ref 80.0–100.0)
MPV: 9.8 fL (ref 7.5–12.5)
Monocytes Relative: 8.3 %
Neutro Abs: 4685 cells/uL (ref 1500–7800)
Neutrophils Relative %: 68.9 %
PLATELETS: 218 10*3/uL (ref 140–400)
RBC: 5.11 10*6/uL (ref 4.20–5.80)
RDW: 13.1 % (ref 11.0–15.0)
TOTAL LYMPHOCYTE: 21.2 %
WBC: 6.8 10*3/uL (ref 3.8–10.8)

## 2018-04-19 LAB — COMPLETE METABOLIC PANEL WITH GFR
AG RATIO: 1.7 (calc) (ref 1.0–2.5)
ALBUMIN MSPROF: 4.4 g/dL (ref 3.6–5.1)
ALT: 16 U/L (ref 9–46)
AST: 12 U/L (ref 10–35)
Alkaline phosphatase (APISO): 50 U/L (ref 35–144)
BUN: 20 mg/dL (ref 7–25)
CO2: 22 mmol/L (ref 20–32)
CREATININE: 1.09 mg/dL (ref 0.70–1.25)
Calcium: 9.6 mg/dL (ref 8.6–10.3)
Chloride: 105 mmol/L (ref 98–110)
GFR, EST AFRICAN AMERICAN: 83 mL/min/{1.73_m2} (ref >=60)
GFR, EST NON AFRICAN AMERICAN: 71 mL/min/{1.73_m2} (ref >=60)
GLOBULIN: 2.6 g/dL (ref 1.9–3.7)
Glucose, Bld: 173 mg/dL — ABNORMAL HIGH (ref 65–99)
POTASSIUM: 4.5 mmol/L (ref 3.5–5.3)
SODIUM: 138 mmol/L (ref 135–146)
TOTAL PROTEIN: 7 g/dL (ref 6.1–8.1)
Total Bilirubin: 0.5 mg/dL (ref 0.2–1.2)

## 2018-04-19 LAB — LIPID PANEL
Cholesterol: 157 mg/dL (ref <200)
HDL: 33 mg/dL — ABNORMAL LOW (ref >=40)
LDL Cholesterol (Calc): 92 mg/dL (calc)
Non-HDL Cholesterol (Calc): 124 mg/dL (calc) (ref <130)
TRIGLYCERIDES: 234 mg/dL — AB (ref <150)
Total CHOL/HDL Ratio: 4.8 (calc) (ref <5.0)

## 2018-04-19 LAB — HEMOGLOBIN A1C
Hgb A1c MFr Bld: 8 % of total Hgb — ABNORMAL HIGH (ref <5.7)
MEAN PLASMA GLUCOSE: 183 (calc)
eAG (mmol/L): 10.1 (calc)

## 2018-04-23 ENCOUNTER — Other Ambulatory Visit: Payer: Self-pay | Admitting: Family Medicine

## 2018-04-23 MED ORDER — INSULIN ISOPHANE & REGULAR (HUMAN 70-30)100 UNIT/ML KWIKPEN: PEN_INJECTOR | SUBCUTANEOUS | 3 refills | Status: DC

## 2018-05-08 ENCOUNTER — Encounter: Payer: Self-pay | Admitting: Gastroenterology

## 2018-05-26 ENCOUNTER — Other Ambulatory Visit: Payer: Self-pay

## 2018-05-26 ENCOUNTER — Ambulatory Visit (INDEPENDENT_AMBULATORY_CARE_PROVIDER_SITE_OTHER): Payer: Medicare HMO | Admitting: Family Medicine

## 2018-05-26 ENCOUNTER — Encounter: Payer: Self-pay | Admitting: Family Medicine

## 2018-05-26 VITALS — BP 120/68 | HR 64 | Temp 98.5°F | Resp 16 | Ht 69.0 in | Wt 206.0 lb

## 2018-05-26 DIAGNOSIS — E118 Type 2 diabetes mellitus with unspecified complications: Secondary | ICD-10-CM | POA: Diagnosis not present

## 2018-05-26 DIAGNOSIS — I4891 Unspecified atrial fibrillation: Secondary | ICD-10-CM | POA: Diagnosis not present

## 2018-05-26 DIAGNOSIS — Z794 Long term (current) use of insulin: Secondary | ICD-10-CM | POA: Diagnosis not present

## 2018-05-26 LAB — PT WITH INR/FINGERSTICK
INR, fingerstick: 1.9 ratio — ABNORMAL HIGH
PT, fingerstick: 23 s — ABNORMAL HIGH (ref 10.5–13.1)

## 2018-05-26 NOTE — Progress Notes (Signed)
Subjective:    Patient ID: Brian [name], male    DOB: 06-Feb-1956, 63 y.o.   MRN: [id number]  04/18/18 Patient is here today to recheck his Coumadin.  Currently on 10 mg a day.  His INR today is 2.0.  Patient's INR is therapeutic.  Unfortunately, the patient has been out of medication/insulin for approximately 1 month.  He is unable to afford the co-pay which is over $300.  He has a high deductible plan apparently.  He is originally on Levemir 70 units in the morning 40 units in the evening which would be 110 units total.  At that time, my plan was: INR is therapeutic.  Continue Coumadin 10 mg a day and recheck INR in 6 weeks.  I gave the patient samples of Toujeo.  I recommended he use 80 units once a day and monitor his blood sugar and gradually increase to 110 units once a day depending upon how he tolerates the insulin.  Check CBC, CMP, fasting lipid panel, and hemoglobin A1c.  Recommended the patient check with his insurance to see if there is any cheaper versions of insulin while he is on the samples.  Likely can switch the patient to 70/30 insulin which would be much more affordable for him.  05/26/18 Here for follow up.  INR is 1.9 today.  Has yet to take his coumadin this morning.  Usually takes it between 9-10.  Patient is scheduled to leave to go to [place] to stay with his family for the next 2 months.  He ultimately decided to resume Levemir.  He is afraid to take 70/30 insulin due to the risk of hypoglycemia with rapid acting insulin.  He was able to afford the Levemir when he recently received his check.  He has enough insulin to last at least until he gets back from [place].  He is still taking 70 units in the morning and 40 units in the afternoon.  He states that his sugars are typically between 102 100 without any hypoglycemic episodes. Past Surgical History:  Procedure Laterality Date  . Ear surgery as a child    . TONSILLECTOMY     Current Outpatient Medications on File Prior  to Visit  Medication Sig Dispense Refill  . amLODipine (NORVASC) 10 MG tablet TAKE 1 TABLET BY MOUTH EVERY DAY 90 tablet 1  . atorvastatin (LIPITOR) 20 MG tablet Take 1 tablet (20 mg total) daily by mouth. 90 tablet 3  . carvedilol (COREG) 25 MG tablet TAKE 1 TABLET BY MOUTH TWICE A DAY WITH A MEAL 180 tablet 3  . glucose blood (ONE TOUCH ULTRA TEST) test strip CHECK BLOOD SUGAR FOUR TIMES DAILY (e11.9) 400 each 3  . hydrALAZINE (APRESOLINE) 50 MG tablet TAKE 1 TABLET BY MOUTH THREE TIMES A DAY 270 tablet 1  . HYDROcodone-acetaminophen (NORCO) 5-325 MG tablet Take 1 tablet by mouth every 6 (six) hours as needed for moderate pain. 20 tablet 0  . Insulin Isophane & Regular Human (NOVOLIN 70/30 FLEXPEN) (70-30) 100 UNIT/ML PEN Inject 50 units in the am and 30 units in pm 5 pen 3  . Insulin Pen Needle 31G X 5 MM MISC Use daily with insulin 100 each 5  . Insulin Syringe-Needle U-100 (INSULIN SYRINGE .3CC/31GX5/16") 31G X 5/16" 0.3 ML MISC Use twice daily to inject insulin. 100 each 10  . ketoconazole (NIZORAL) 2 % shampoo Apply 1 application topically 2 (two) times a week. 120 mL 11  . losartan (COZAAR) 25 MG  tablet Take 1 tablet (25 mg total) by mouth daily. 90 tablet 3  . metFORMIN (GLUCOPHAGE) 850 MG tablet TAKE 1 TABLET BY MOUTH TWICE A DAY WITH MEALS 180 tablet 5  . warfarin (COUMADIN) 10 MG tablet TAKE 1 TABLET BY MOUTH EVERY DAY EXCEPT 1/2 TAB ON FRIDAYS AND MONDAYS (Patient taking differently: Take 10 mg by mouth daily. ) 90 tablet 3   No current facility-administered medications on file prior to visit.    Allergies  Allergen Reactions  . Codeine Other (See Comments)    Unknown reaction, severe headach   Social History   Socioeconomic History  . Marital status: Divorced    Spouse name: Not on file  . Number of children: 2  . Years of education: Not on file  . Highest education level: Not on file  Occupational History  . Occupation: Disabled  Social Needs  . Financial resource  strain: Not on file  . Food insecurity:    Worry: Not on file    Inability: Not on file  . Transportation needs:    Medical: Not on file    Non-medical: Not on file  Tobacco Use  . Smoking status: Never Smoker  . Smokeless tobacco: Never Used  Substance and Sexual Activity  . Alcohol use: Yes    Alcohol/week: 0.0 standard drinks    Comment: seldom  . Drug use: Yes    Frequency: 7.0 times per week    Comment: marijauna for arthritis  . Sexual activity: Not on file  Lifestyle  . Physical activity:    Days per week: Not on file    Minutes per session: Not on file  . Stress: Not on file  Relationships  . Social connections:    Talks on phone: Not on file    Gets together: Not on file    Attends religious service: Not on file    Active member of club or organization: Not on file    Attends meetings of clubs or organizations: Not on file    Relationship status: Not on file  . Intimate partner violence:    Fear of current or ex partner: Not on file    Emotionally abused: Not on file    Physically abused: Not on file    Forced sexual activity: Not on file  Other Topics Concern  . Not on file  Social History Narrative   He has children.    Review of Systems  All other systems reviewed and are negative.      Objective:   Physical Exam  Cardiovascular: Normal rate, regular rhythm and normal heart sounds.  Pulmonary/Chest: Effort normal and breath sounds normal. No respiratory distress. He has no wheezes. He has no rales. He exhibits no tenderness.  Abdominal: Soft. Bowel sounds are normal. He exhibits no distension. There is no abdominal tenderness. There is no rebound.  Musculoskeletal:        General: No edema.  Vitals reviewed.         Assessment & Plan:  Atrial fibrillation, unspecified type ([place]) - Plan: PT with INR/Fingerstick, PT with INR/Fingerstick  Controlled type 2 diabetes mellitus with complication, with long-term current use of insulin ([place])  I  have recommended that the patient go take his Coumadin as soon as he gets home.  He is consistently around 2 and therefore I will not change his Coumadin dose.  His blood sugar sound relatively well controlled as long as he stays on his Levemir.  In the past his  A1c has been elevated when he discontinued the Levemir for several months due to cost.  At present, he has enough money to be able to afford the Levemir and does not want to try 70/30.  Therefore we will recheck his Coumadin when he gets back from [place].  Make no changes in his insulin dose at the present time.

## 2018-06-25 ENCOUNTER — Other Ambulatory Visit: Payer: Self-pay | Admitting: Family Medicine

## 2018-07-02 ENCOUNTER — Other Ambulatory Visit: Payer: Self-pay | Admitting: Family Medicine

## 2018-07-23 ENCOUNTER — Other Ambulatory Visit: Payer: Self-pay | Admitting: Family Medicine

## 2018-07-28 ENCOUNTER — Ambulatory Visit (INDEPENDENT_AMBULATORY_CARE_PROVIDER_SITE_OTHER): Payer: Medicare HMO | Admitting: Gastroenterology

## 2018-07-28 ENCOUNTER — Telehealth: Payer: Self-pay

## 2018-07-28 ENCOUNTER — Other Ambulatory Visit: Payer: Self-pay

## 2018-07-28 ENCOUNTER — Encounter: Payer: Self-pay | Admitting: Gastroenterology

## 2018-07-28 VITALS — Ht 69.0 in | Wt 206.0 lb

## 2018-07-28 DIAGNOSIS — Z86010 Personal history of colonic polyps: Secondary | ICD-10-CM

## 2018-07-28 MED ORDER — PEG 3350-KCL-NA BICARB-NACL 420 G PO SOLR: 4000.0000 mL | ORAL | 0 refills | Status: DC

## 2018-07-28 NOTE — Patient Instructions (Addendum)
We will arrange a colonoscopy to be performed at his soonest convenience for personal history of adenomatous colon polyps.  He knows that he will need to stop taking his Coumadin 5 days prior to the colonoscopy date.  We will contact his primary care physician Dr. Dennard Schaumann, to make sure he agrees with that recommendation.  Thank you for entrusting me with your care and choosing [facility].  Dr Ardis Hughs

## 2018-07-28 NOTE — Telephone Encounter (Signed)
[facility] Pre-operative Risk Assessment     Request for surgical clearance:     Endoscopy Procedure  What type of surgery is being performed?     colonoscopy  When is this surgery scheduled?     08/12/18  What type of clearance is required ?   Pharmacy  Are there any medications that need to be held prior to surgery and how long? Coumadin 10 mg  Practice name and name of physician performing surgery?      [facility]  What is your office phone and fax number?      Phone- [phone]  [phone]  Anesthesia type (None, local, MAC, general) ?       MAC

## 2018-07-28 NOTE — Progress Notes (Signed)
Review of pertinent gastrointestinal problems: 1.  Adenomatous colon polyps.  Colonoscopy March 2017 for routine risk screening found two tubular adenomas.  The largest was 15 mm.  These were both completely removed.  He was recommended to have repeat colonoscopy at 3-year interval.   This service was provided via virtual visit. Only audio was used.  The patient was located at home.  I was located in my office.  The patient did consent to this virtual visit and is aware of possible charges through their insurance for this visit.  The patient is an established patient.  My certified medical assistant, Grace Bushy, contributed to this visit by contacting the patient by phone 1 or 2 business days prior to the appointment and also followed up on the recommendations I made after the visit.  Time spent on virtual visit: 21 minutes   HPI: This is a very pleasant 63 year old man whom I last saw the time of a colonoscopy about 3 years ago.  It sounds like he was having some loose stools and even incontinence several months ago but this completely stopped when he stopped taking metformin.  He has had no bleeding.  He has had no serious abdominal pains.  His weight has been overall stable.  Blood work February 2020 shows a normal CBC.  He continues to take Coumadin for atrial fibrillation.  I believe it is prescribed by his primary care physician.  Chief complaint is history of adenomatous polyps, chronic atrial fibrillation and anticoagulation  ROS: complete GI ROS as described in HPI, all other review negative.  Constitutional:  No unintentional weight loss   Past Medical History:  Diagnosis Date  . Arthritis   . Atrial fibrillation ([place])   . Cardiomyopathy   . Cerebrovascular accident ([place])    2012  . Diabetes mellitus   . Hyperlipidemia   . Hypertension   . Left-sided weakness   . 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 Zigmund Daniel, laser therapy OD  . Seizures ([place])    pt  reports this is from low blood sugar    Past Surgical History:  Procedure Laterality Date  . Ear surgery as a child    . TONSILLECTOMY      Current Outpatient Medications  Medication Sig Dispense Refill  . amLODipine (NORVASC) 10 MG tablet TAKE 1 TABLET BY MOUTH EVERY DAY 90 tablet 1  . atorvastatin (LIPITOR) 20 MG tablet Take 1 tablet (20 mg total) daily by mouth. 90 tablet 3  . carvedilol (COREG) 25 MG tablet TAKE 1 TABLET BY MOUTH TWICE A DAY WITH A MEAL 180 tablet 3  . glucose blood (ONE TOUCH ULTRA TEST) test strip CHECK BLOOD SUGAR FOUR TIMES DAILY (e11.9) 400 each 3  . hydrALAZINE (APRESOLINE) 50 MG tablet TAKE 1 TABLET BY MOUTH THREE TIMES A DAY 270 tablet 1  . HYDROcodone-acetaminophen (NORCO) 5-325 MG tablet Take 1 tablet by mouth every 6 (six) hours as needed for moderate pain. 20 tablet 0  . Insulin Isophane & Regular Human (NOVOLIN 70/30 FLEXPEN) (70-30) 100 UNIT/ML PEN Inject 50 units in the am and 30 units in pm 5 pen 3  . Insulin Pen Needle 31G X 5 MM MISC Use daily with insulin 100 each 5  . Insulin Syringe-Needle U-100 (INSULIN SYRINGE .3CC/31GX5/16") 31G X 5/16" 0.3 ML MISC Use twice daily to inject insulin. 100 each 10  . ketoconazole (NIZORAL) 2 % shampoo Apply 1 application topically 2 (two) times a week. 120 mL 11  .  LEVEMIR FLEXTOUCH 100 UNIT/ML Pen INJECT 70 UNITS UNDER THE SKIN EVERY MORNING AND 40 UNITS AT BEDTIME 15 mL 1  . losartan (COZAAR) 25 MG tablet Take 1 tablet (25 mg total) by mouth daily. 90 tablet 3  . metFORMIN (GLUCOPHAGE) 850 MG tablet TAKE 1 TABLET BY MOUTH TWICE A DAY WITH MEALS 180 tablet 5  . warfarin (COUMADIN) 10 MG tablet TAKE 1 TABLET BY MOUTH EVERY DAY EXCEPT 1/2 TAB ON FRIDAYS AND MONDAYS (Patient taking differently: Take 10 mg by mouth daily. ) 90 tablet 3   No current facility-administered medications for this visit.     Allergies as of 07/28/2018 - Review Complete 07/28/2018  Allergen Reaction Noted  . Codeine Other (See Comments)      Family History  Problem Relation Age of Onset  . Hyperlipidemia Father   . Hypertension Father   . Coronary artery disease Neg Hx        no early CAD.  Marland Kitchen Colon cancer Neg Hx     Social History   Socioeconomic History  . Marital status: Divorced    Spouse name: Not on file  . Number of children: 2  . Years of education: Not on file  . Highest education level: Not on file  Occupational History  . Occupation: Disabled  Social Needs  . Financial resource strain: Not on file  . Food insecurity:    Worry: Not on file    Inability: Not on file  . Transportation needs:    Medical: Not on file    Non-medical: Not on file  Tobacco Use  . Smoking status: Never Smoker  . Smokeless tobacco: Never Used  Substance and Sexual Activity  . Alcohol use: Yes    Alcohol/week: 0.0 standard drinks    Comment: seldom  . Drug use: Yes    Frequency: 7.0 times per week    Comment: marijauna for arthritis  . Sexual activity: Not on file  Lifestyle  . Physical activity:    Days per week: Not on file    Minutes per session: Not on file  . Stress: Not on file  Relationships  . Social connections:    Talks on phone: Not on file    Gets together: Not on file    Attends religious service: Not on file    Active member of club or organization: Not on file    Attends meetings of clubs or organizations: Not on file    Relationship status: Not on file  . Intimate partner violence:    Fear of current or ex partner: Not on file    Emotionally abused: Not on file    Physically abused: Not on file    Forced sexual activity: Not on file  Other Topics Concern  . Not on file  Social History Narrative   He has children.     Physical Exam: Unable to perform because this was a "telemed visit" due to current Covid-19 pandemic  Assessment and plan: 63 y.o. male with history of adenomatous colon polyps, chronic atrial fibrillation with need for anticoagulation  We will arrange for a  colonoscopy to be performed at his soonest convenience.  He understands that being on a blood thinner chronically puts him at increased risk for procedure related bleeding and so I recommended that he stop the Coumadin for 5 days prior to the colonoscopy.  We will contact his primary care physician to make sure that he agrees with that recommendation.  I see no reason  for any further blood tests or imaging studies prior to then.  Please see the "Patient Instructions" section for addition details about the plan.  Owens Loffler, MD [facility] 07/28/2018, 8:42 AM

## 2018-08-01 ENCOUNTER — Telehealth: Payer: Self-pay

## 2018-08-01 NOTE — Telephone Encounter (Signed)
Patient unavailable and Mom said she would pass the information on to Wheeler. Informed her that he can hold his coumadin for 5 days prior to the procedure on 08/12/2018.

## 2018-08-11 ENCOUNTER — Telehealth: Payer: Self-pay | Admitting: Gastroenterology

## 2018-08-11 NOTE — Telephone Encounter (Signed)
Covid-19 Screening Questions ° °  °Do you now or have you had a fever in the last 14 days? No ° °Do you have any respiratory symptoms of shortness of breath or cough now or in the last 14 days?  No ° °Do you have any family members or close contacts with diagnosed or suspected Covid-19 in the past 14 days?  No ° °Have you been tested for Covid-19 and found to be positive? No ° °Pt made aware of that care partner may come to the lobby during the procedure but will need to provide their own mask and was asked to bring one if available. ° °  °

## 2018-08-12 ENCOUNTER — Encounter: Payer: Medicare Other | Admitting: Gastroenterology

## 2018-08-12 ENCOUNTER — Ambulatory Visit (AMBULATORY_SURGERY_CENTER): Payer: Medicare HMO | Admitting: Gastroenterology

## 2018-08-12 ENCOUNTER — Other Ambulatory Visit: Payer: Self-pay

## 2018-08-12 ENCOUNTER — Encounter: Payer: Self-pay | Admitting: Gastroenterology

## 2018-08-12 VITALS — BP 132/76 | HR 70 | Temp 98.6°F | Resp 15 | Ht 69.0 in | Wt 206.0 lb

## 2018-08-12 DIAGNOSIS — Z86010 Personal history of colonic polyps: Secondary | ICD-10-CM | POA: Diagnosis not present

## 2018-08-12 DIAGNOSIS — K635 Polyp of colon: Secondary | ICD-10-CM | POA: Diagnosis not present

## 2018-08-12 DIAGNOSIS — D123 Benign neoplasm of transverse colon: Secondary | ICD-10-CM

## 2018-08-12 MED ORDER — SODIUM CHLORIDE 0.9 % IV SOLN: 500.0000 mL | Freq: Once | INTRAVENOUS | Status: DC

## 2018-08-12 NOTE — Progress Notes (Signed)
Called to room to assist during endoscopic procedure.  Patient ID and intended procedure confirmed with present staff. Received instructions for my participation in the procedure from the performing physician.  

## 2018-08-12 NOTE — Op Note (Addendum)
[name] Patient Name: Brian [name] Procedure Date: 08/12/2018 8:23 AM MRN: [id number] Endoscopist: Milus Banister , MD Age: 63 Referring MD:  Date of Birth: [birth year] Gender: Male Account #: [id number] Procedure:                Colonoscopy Indications:              High risk colon cancer surveillance: Personal                            history of colonic polyps; Colonoscopy March 2017                            for routine risk screening found two tubular                            adenomas. The largest was 15 mm. These were both                            completely removed. He was recommended to have                            repeat colonoscopy at 3-year interval Medicines:                Monitored Anesthesia Care Procedure:                Pre-Anesthesia Assessment:                           - Prior to the procedure, a History and Physical                            was performed, and patient medications and                            allergies were reviewed. The patient's tolerance of                            previous anesthesia was also reviewed. The risks                            and benefits of the procedure and the sedation                            options and risks were discussed with the patient.                            All questions were answered, and informed consent                            was obtained. Prior Anticoagulants: The patient has                            taken Coumadin (warfarin), last dose was 5 days  prior to procedure. ASA Grade Assessment: III - A                            patient with severe systemic disease. After                            reviewing the risks and benefits, the patient was                            deemed in satisfactory condition to undergo the                            procedure.                           After obtaining informed consent, the colonoscope                             was passed under direct vision. Throughout the                            procedure, the patient's blood pressure, pulse, and                            oxygen saturations were monitored continuously. The                            Colonoscope was introduced through the anus and                            advanced to the the cecum, identified by                            appendiceal orifice and ileocecal valve. The                            colonoscopy was performed without difficulty. The                            patient tolerated the procedure well. The quality                            of the bowel preparation was good. The ileocecal                            valve, appendiceal orifice, and rectum were                            photographed. Scope In: 8:48:52 AM Scope Out: 9:59:49 AM Scope Withdrawal Time: 1 hour 5 minutes 55 seconds  Total Procedure Duration: 1 hour 10 minutes 57 seconds  Findings:                 A 3 mm polyp was found in the transverse colon. The  polyp was sessile. The polyp was removed with a                            cold snare. Resection and retrieval were complete.                           External and internal hemorrhoids were found. The                            hemorrhoids were small.                           The exam was otherwise without abnormality on                            direct and retroflexion views. Complications:            No immediate complications. Estimated blood loss:                            None. Estimated Blood Loss:     Estimated blood loss: none. Impression:               - One 3 mm polyp in the transverse colon, removed                            with a cold snare. Resected and retrieved.                           - External and internal hemorrhoids.                           - The examination was otherwise normal on direct                            and retroflexion  views. Recommendation:           - Patient has a contact number available for                            emergencies. The signs and symptoms of potential                            delayed complications were discussed with the                            patient. Return to normal activities tomorrow.                            Written discharge instructions were provided to the                            patient.                           - Resume previous diet.                           -  Continue present medications. Ok to resume                            coumadin today.                           You will receive a letter within 2-3 weeks with the                            pathology results and my final recommendations.                           If the polyp(s) is proven to be 'pre-cancerous' on                            pathology, you will need repeat colonoscopy in 5                            years. Milus Banister, MD 08/12/2018 9:01:58 AM This report has been signed electronically.

## 2018-08-12 NOTE — Progress Notes (Signed)
PT taken to PACU. Monitors in place. VSS. Report given to RN. 

## 2018-08-12 NOTE — Patient Instructions (Signed)
Discharge instructions given. Handouts on polyps and hemorrhoids. Resume previous medications. Resume Coumadin today. YOU HAD AN ENDOSCOPIC PROCEDURE TODAY AT [facility]:   Refer to the procedure report that was given to you for any specific questions about what was found during the examination.  If the procedure report does not answer your questions, please call your gastroenterologist to clarify.  If you requested that your care partner not be given the details of your procedure findings, then the procedure report has been included in a sealed envelope for you to review at your convenience later.  YOU SHOULD EXPECT: Some feelings of bloating in the abdomen. Passage of more gas than usual.  Walking can help get rid of the air that was put into your GI tract during the procedure and reduce the bloating. If you had a lower endoscopy (such as a colonoscopy or flexible sigmoidoscopy) you may notice spotting of blood in your stool or on the toilet paper. If you underwent a bowel prep for your procedure, you may not have a normal bowel movement for a few days.  Please Note:  You might notice some irritation and congestion in your nose or some drainage.  This is from the oxygen used during your procedure.  There is no need for concern and it should clear up in a day or so.  SYMPTOMS TO REPORT IMMEDIATELY:   Following lower endoscopy (colonoscopy or flexible sigmoidoscopy):  Excessive amounts of blood in the stool  Significant tenderness or worsening of abdominal pains  Swelling of the abdomen that is new, acute  Fever of 100F or higher   For urgent or emergent issues, a gastroenterologist can be reached at any hour by calling [phone].   DIET:  We do recommend a small meal at first, but then you may proceed to your regular diet.  Drink plenty of fluids but you should avoid alcoholic beverages for 24 hours.  ACTIVITY:  You should plan to take it easy for the rest of today  and you should NOT DRIVE or use heavy machinery until tomorrow (because of the sedation medicines used during the test).    FOLLOW UP: Our staff will call the number listed on your records 48-72 hours following your procedure to check on you and address any questions or concerns that you may have regarding the information given to you following your procedure. If we do not reach you, we will leave a message.  We will attempt to reach you two times.  During this call, we will ask if you have developed any symptoms of COVID 19. If you develop any symptoms (ie: fever, flu-like symptoms, shortness of breath, cough etc.) before then, please call [phone].  If you test positive for Covid 19 in the 2 weeks post procedure, please call and report this information to [name].    If any biopsies were taken you will be contacted by phone or by letter within the next 1-3 weeks.  Please call us at [phone] if you have not heard about the biopsies in 3 weeks.    SIGNATURES/CONFIDENTIALITY: You and/or your care partner have signed paperwork which will be entered into your electronic medical record.  These signatures attest to the fact that that the information above on your After Visit Summary has been reviewed and is understood.  Full responsibility of the confidentiality of this discharge information lies with you and/or your care-partner.

## 2018-08-14 ENCOUNTER — Telehealth: Payer: Self-pay | Admitting: *Deleted

## 2018-08-14 ENCOUNTER — Telehealth: Payer: Self-pay

## 2018-08-14 NOTE — Telephone Encounter (Signed)
Unable to reach pt on callback ,phone would ring x2 then hang up on both attempts

## 2018-08-14 NOTE — Telephone Encounter (Signed)
NO ANSWER, unable to leave message.

## 2018-08-19 ENCOUNTER — Encounter: Payer: Self-pay | Admitting: Gastroenterology

## 2018-08-30 ENCOUNTER — Other Ambulatory Visit: Payer: Self-pay | Admitting: Family Medicine

## 2018-09-13 DIAGNOSIS — H524 Presbyopia: Secondary | ICD-10-CM | POA: Diagnosis not present

## 2018-09-13 DIAGNOSIS — Z0100 Encounter for examination of eyes and vision without abnormal findings: Secondary | ICD-10-CM | POA: Diagnosis not present

## 2018-09-13 DIAGNOSIS — E119 Type 2 diabetes mellitus without complications: Secondary | ICD-10-CM | POA: Diagnosis not present

## 2018-09-13 LAB — HM DIABETES EYE EXAM

## 2018-09-23 ENCOUNTER — Other Ambulatory Visit: Payer: Self-pay

## 2018-09-23 MED ORDER — WARFARIN SODIUM 10 MG PO TABS: 10.0000 mg | ORAL_TABLET | Freq: Every day | ORAL | 0 refills | Status: DC

## 2018-09-24 ENCOUNTER — Other Ambulatory Visit: Payer: Self-pay | Admitting: Family Medicine

## 2018-09-25 ENCOUNTER — Other Ambulatory Visit: Payer: Self-pay

## 2018-09-26 ENCOUNTER — Ambulatory Visit (INDEPENDENT_AMBULATORY_CARE_PROVIDER_SITE_OTHER): Payer: Medicare HMO | Admitting: Family Medicine

## 2018-09-26 ENCOUNTER — Encounter: Payer: Self-pay | Admitting: Family Medicine

## 2018-09-26 VITALS — BP 152/90 | HR 70 | Temp 98.3°F | Resp 16 | Ht 69.0 in | Wt 209.0 lb

## 2018-09-26 DIAGNOSIS — I4891 Unspecified atrial fibrillation: Secondary | ICD-10-CM | POA: Diagnosis not present

## 2018-09-26 DIAGNOSIS — E119 Type 2 diabetes mellitus without complications: Secondary | ICD-10-CM | POA: Diagnosis not present

## 2018-09-26 DIAGNOSIS — E7800 Pure hypercholesterolemia, unspecified: Secondary | ICD-10-CM

## 2018-09-26 DIAGNOSIS — R5382 Chronic fatigue, unspecified: Secondary | ICD-10-CM | POA: Diagnosis not present

## 2018-09-26 DIAGNOSIS — I10 Essential (primary) hypertension: Secondary | ICD-10-CM | POA: Diagnosis not present

## 2018-09-26 DIAGNOSIS — Z794 Long term (current) use of insulin: Secondary | ICD-10-CM

## 2018-09-26 NOTE — Progress Notes (Signed)
Subjective:    Patient ID: Brian [name], male    DOB: 08-22-1955, 63 y.o.   MRN: [id number]  04/18/18 Patient is here today to recheck his Coumadin.  Currently on 10 mg a day.  His INR today is 2.0.  Patient's INR is therapeutic.  Unfortunately, the patient has been out of medication/insulin for approximately 1 month.  He is unable to afford the co-pay which is over $300.  He has a high deductible plan apparently.  He is originally on Levemir 70 units in the morning 40 units in the evening which would be 110 units total.  At that time, my plan was: INR is therapeutic.  Continue Coumadin 10 mg a day and recheck INR in 6 weeks.  I gave the patient samples of Toujeo.  I recommended he use 80 units once a day and monitor his blood sugar and gradually increase to 110 units once a day depending upon how he tolerates the insulin.  Check CBC, CMP, fasting lipid panel, and hemoglobin A1c.  Recommended the patient check with his insurance to see if there is any cheaper versions of insulin while he is on the samples.  Likely can switch the patient to 70/30 insulin which would be much more affordable for him.  05/26/18 Here for follow up.  INR is 1.9 today.  Has yet to take his coumadin this morning.  Usually takes it between 9-10.  Patient is scheduled to leave to go to [place] to stay with his family for the next 2 months.  He ultimately decided to resume Levemir.  He is afraid to take 70/30 insulin due to the risk of hypoglycemia with rapid acting insulin.  He was able to afford the Levemir when he recently received his check.  He has enough insulin to last at least until he gets back from [place].  He is still taking 70 units in the morning and 40 units in the afternoon.  He states that his sugars are typically between 100-200 without any hypoglycemic episodes.  At that time, my plan was: I have recommended that the patient go take his Coumadin as soon as he gets home.  He is consistently around 2 and  therefore I will not change his Coumadin dose.  His blood sugar sound relatively well controlled as long as he stays on his Levemir.  In the past his A1c has been elevated when he discontinued the Levemir for several months due to cost.  At present, he has enough money to be able to afford the Levemir and does not want to try 70/30.  Therefore we will recheck his Coumadin when he gets back from [place].  Make no changes in his insulin dose at the present time.  09/26/18 Patient has not been taking or checking his sugars.  He denies any symptoms of hypoglycemia.  He denies any polyuria, polydipsia, or blurry vision.  He seldom checks his blood sugars.  He is still taking Levemir 70 units in the morning 40 units in the evening.  He denies any neuropathy in his feet.  Diabetic foot exam was performed today and is normal.  He recently had his eyes checked and an updated prescription of his glasses.  He denies any history of diabetic retinopathy.  His blood pressure is elevated today however he is upset because he has been out of his warfarin for several days due to a mixup at the pharmacy.  Therefore there is no reason to check his INR today  as he is only been back on his warfarin for 24 hours.  He is overdue for fasting lab work to monitor his diabetes as well as his cholesterol.  He states that when he checks his blood pressure at home his systolic blood pressures less than 630 and his diastolic blood pressure is less than 90.  He denies any chest pain.  He denies any shortness of breath.  He denies any dyspnea on exertion.  He is currently in sinus rhythm today.  He denies any palpitations or syncope. Past Surgical History:  Procedure Laterality Date  . Ear surgery as a child    . TONSILLECTOMY     Current Outpatient Medications on File Prior to Visit  Medication Sig Dispense Refill  . amLODipine (NORVASC) 10 MG tablet TAKE 1 TABLET BY MOUTH EVERY DAY 90 tablet 1  . atorvastatin (LIPITOR) 20 MG tablet Take  1 tablet (20 mg total) daily by mouth. 90 tablet 3  . carvedilol (COREG) 25 MG tablet TAKE 1 TABLET BY MOUTH TWICE A DAY WITH A MEAL 180 tablet 3  . glucose blood (ONE TOUCH ULTRA TEST) test strip CHECK BLOOD SUGAR FOUR TIMES DAILY (e11.9) 400 each 3  . hydrALAZINE (APRESOLINE) 50 MG tablet TAKE 1 TABLET BY MOUTH THREE TIMES A DAY 270 tablet 1  . Insulin Pen Needle 31G X 5 MM MISC Use daily with insulin 100 each 5  . Insulin Syringe-Needle U-100 (INSULIN SYRINGE .3CC/31GX5/16") 31G X 5/16" 0.3 ML MISC Use twice daily to inject insulin. 100 each 10  . LEVEMIR FLEXTOUCH 100 UNIT/ML Pen INJECT 70 UNITS UNDER THE SKIN EVERY MORNING AND 40 UNITS AT BEDTIME 15 mL 2  . losartan (COZAAR) 25 MG tablet TAKE 1 TABLET BY MOUTH EVERY DAY 90 tablet 3  . metFORMIN (GLUCOPHAGE) 850 MG tablet TAKE 1 TABLET BY MOUTH TWICE A DAY WITH MEALS 180 tablet 5  . warfarin (COUMADIN) 10 MG tablet Take 1 tablet (10 mg total) by mouth daily. 90 tablet 0  . Insulin Isophane & Regular Human (NOVOLIN 70/30 FLEXPEN) (70-30) 100 UNIT/ML PEN Inject 50 units in the am and 30 units in pm (Patient not taking: Reported on 09/26/2018) 5 pen 3   No current facility-administered medications on file prior to visit.    Allergies  Allergen Reactions  . Codeine Other (See Comments)    Unknown reaction, severe headach   Social History   Socioeconomic History  . Marital status: Divorced    Spouse name: Not on file  . Number of children: 2  . Years of education: Not on file  . Highest education level: Not on file  Occupational History  . Occupation: Disabled  Social Needs  . Financial resource strain: Not on file  . Food insecurity    Worry: Not on file    Inability: Not on file  . Transportation needs    Medical: Not on file    Non-medical: Not on file  Tobacco Use  . Smoking status: Smoker, Current Status Unknown  . Smokeless tobacco: Never Used  Substance and Sexual Activity  . Alcohol use: Yes    Alcohol/week: 0.0  standard drinks    Comment: seldom  . Drug use: Yes    Frequency: 7.0 times per week    Comment: marijauna for arthritis  . Sexual activity: Not on file  Lifestyle  . Physical activity    Days per week: Not on file    Minutes per session: Not on file  . Stress:  Not on file  Relationships  . Social Herbalist on phone: Not on file    Gets together: Not on file    Attends religious service: Not on file    Active member of club or organization: Not on file    Attends meetings of clubs or organizations: Not on file    Relationship status: Not on file  . Intimate partner violence    Fear of current or ex partner: Not on file    Emotionally abused: Not on file    Physically abused: Not on file    Forced sexual activity: Not on file  Other Topics Concern  . Not on file  Social History Narrative   He has children.    Review of Systems  All other systems reviewed and are negative.      Objective:   Physical Exam  Cardiovascular: Normal rate, regular rhythm and normal heart sounds.  Pulmonary/Chest: Effort normal and breath sounds normal. No respiratory distress. He has no wheezes. He has no rales. He exhibits no tenderness.  Abdominal: Soft. Bowel sounds are normal. He exhibits no distension. There is no abdominal tenderness. There is no rebound.  Musculoskeletal:        General: No edema.  Vitals reviewed.         Assessment & Plan:  1. Atrial fibrillation, unspecified type ([place]) Resume warfarin and recheck INR in 2 weeks 2. Type 2 diabetes mellitus without complication, with long-term current use of insulin ([place]) Encourage patient to check fasting blood sugar every day.  Goal fasting blood sugar is less than 130.  Also recommended that he check 2-hour postprandial sugars every day.  Goal 2-hour postprandial sugars less than 160.  I will check hemoglobin A1c.  I will be very happy if his hemoglobin A1c is less than 7 however I suspect that is going to be greater  than 8. - Hemoglobin A1c - CBC with Differential/Platelet - COMPLETE METABOLIC PANEL WITH GFR - Lipid panel - Microalbumin, urine  3. Essential hypertension, benign Blood pressure today is elevated however the patient is upset.  Recommended he check his blood pressure daily at home and notify me if the blood pressures greater than 140/90  4. Pure hypercholesterolemia Check fasting lipid panel.  Goal LDL cholesterol is less than 100.  5. Chronic fatigue As we discussed ordering lab work, the patient admits that he is been feeling extremely tired.  He has no energy.  He reports erectile dysfunction.  He reports decreasing muscle mass.  He requests check his testosterone. - Testosterone Total,Free,Bio, Males

## 2018-09-27 LAB — COMPLETE METABOLIC PANEL WITH GFR
AG Ratio: 1.9 (calc) (ref 1.0–2.5)
ALT: 15 U/L (ref 9–46)
AST: 13 U/L (ref 10–35)
Albumin: 4.5 g/dL (ref 3.6–5.1)
Alkaline phosphatase (APISO): 60 U/L (ref 35–144)
BUN/Creatinine Ratio: 14 (calc) (ref 6–22)
BUN: 21 mg/dL (ref 7–25)
CO2: 26 mmol/L (ref 20–32)
Calcium: 9.7 mg/dL (ref 8.6–10.3)
Chloride: 106 mmol/L (ref 98–110)
Creat: 1.54 mg/dL — ABNORMAL HIGH (ref 0.70–1.25)
GFR, Est African American: 54 mL/min/{1.73_m2} — ABNORMAL LOW (ref >=60)
GFR, Est Non African American: 47 mL/min/{1.73_m2} — ABNORMAL LOW (ref >=60)
Globulin: 2.4 g/dL (calc) (ref 1.9–3.7)
Glucose, Bld: 113 mg/dL — ABNORMAL HIGH (ref 65–99)
Potassium: 4.8 mmol/L (ref 3.5–5.3)
Sodium: 141 mmol/L (ref 135–146)
Total Bilirubin: 0.5 mg/dL (ref 0.2–1.2)
Total Protein: 6.9 g/dL (ref 6.1–8.1)

## 2018-09-27 LAB — HEMOGLOBIN A1C
Hgb A1c MFr Bld: 7.8 % of total Hgb — ABNORMAL HIGH (ref <5.7)
Mean Plasma Glucose: 177 (calc)
eAG (mmol/L): 9.8 (calc)

## 2018-09-27 LAB — LIPID PANEL
Cholesterol: 135 mg/dL (ref <200)
HDL: 32 mg/dL — ABNORMAL LOW (ref >=40)
LDL Cholesterol (Calc): 80 mg/dL (calc)
Non-HDL Cholesterol (Calc): 103 mg/dL (calc) (ref <130)
Total CHOL/HDL Ratio: 4.2 (calc) (ref <5.0)
Triglycerides: 129 mg/dL (ref <150)

## 2018-09-27 LAB — CBC WITH DIFFERENTIAL/PLATELET
Absolute Monocytes: 706 cells/uL (ref 200–950)
Basophils Absolute: 33 cells/uL (ref 0–200)
Basophils Relative: 0.4 %
Eosinophils Absolute: 83 cells/uL (ref 15–500)
Eosinophils Relative: 1 %
HCT: 41.8 % (ref 38.5–50.0)
Hemoglobin: 14.2 g/dL (ref 13.2–17.1)
Lymphs Abs: 1818 cells/uL (ref 850–3900)
MCH: 28.5 pg (ref 27.0–33.0)
MCHC: 34 g/dL (ref 32.0–36.0)
MCV: 83.8 fL (ref 80.0–100.0)
MPV: 9.8 fL (ref 7.5–12.5)
Monocytes Relative: 8.5 %
Neutro Abs: 5661 cells/uL (ref 1500–7800)
Neutrophils Relative %: 68.2 %
Platelets: 228 10*3/uL (ref 140–400)
RBC: 4.99 10*6/uL (ref 4.20–5.80)
RDW: 13.7 % (ref 11.0–15.0)
Total Lymphocyte: 21.9 %
WBC: 8.3 10*3/uL (ref 3.8–10.8)

## 2018-09-29 LAB — TESTOSTERONE TOTAL,FREE,BIO, MALES
Albumin: 4.5 g/dL (ref 3.6–5.1)
Sex Hormone Binding: 21 nmol/L — ABNORMAL LOW (ref 22–77)
Testosterone, Bioavailable: 121.3 ng/dL (ref >=110.0)
Testosterone, Free: 59 pg/mL (ref 46.0–224.0)
Testosterone: 327 ng/dL (ref 250–827)

## 2018-10-10 ENCOUNTER — Other Ambulatory Visit: Payer: Self-pay

## 2018-10-10 ENCOUNTER — Ambulatory Visit (INDEPENDENT_AMBULATORY_CARE_PROVIDER_SITE_OTHER): Payer: Medicare HMO | Admitting: Family Medicine

## 2018-10-10 ENCOUNTER — Encounter: Payer: Self-pay | Admitting: Family Medicine

## 2018-10-10 VITALS — BP 162/92 | HR 66 | Temp 98.0°F | Resp 18 | Ht 69.0 in | Wt 213.0 lb

## 2018-10-10 DIAGNOSIS — I4891 Unspecified atrial fibrillation: Secondary | ICD-10-CM

## 2018-10-10 DIAGNOSIS — M7989 Other specified soft tissue disorders: Secondary | ICD-10-CM

## 2018-10-10 LAB — PT WITH INR/FINGERSTICK
INR, fingerstick: 2.1 ratio — ABNORMAL HIGH
PT, fingerstick: 25.6 s — ABNORMAL HIGH (ref 10.5–13.1)

## 2018-10-10 MED ORDER — FUROSEMIDE 20 MG PO TABS: 20.0000 mg | ORAL_TABLET | Freq: Every day | ORAL | 0 refills | Status: DC | PRN

## 2018-10-10 MED ORDER — TADALAFIL 20 MG PO TABS: 10.0000 mg | ORAL_TABLET | ORAL | 11 refills | Status: DC | PRN

## 2018-10-10 NOTE — Addendum Note (Signed)
Addended by: Jenna Luo T on: 10/10/2018 09:18 AM   Modules accepted: Orders

## 2018-10-10 NOTE — Progress Notes (Addendum)
Subjective:    Patient ID: Brian [name], male    DOB: 1955-03-03, 63 y.o.   MRN: [id number]  Patient is here today to recheck his Coumadin.  He is on 10 mg a day for atrial fibrillation.  His INR today is therapeutic.  He denies any bleeding or bruising.  He does have some mild swelling in both legs.  He has trace bipedal edema.  He states his been like that for a week.  He denies any chest pain shortness of breath dyspnea on exertion orthopnea Past Surgical History:  Procedure Laterality Date  . Ear surgery as a child    . TONSILLECTOMY     Current Outpatient Medications on File Prior to Visit  Medication Sig Dispense Refill  . amLODipine (NORVASC) 10 MG tablet TAKE 1 TABLET BY MOUTH EVERY DAY 90 tablet 1  . atorvastatin (LIPITOR) 20 MG tablet Take 1 tablet (20 mg total) daily by mouth. 90 tablet 3  . carvedilol (COREG) 25 MG tablet TAKE 1 TABLET BY MOUTH TWICE A DAY WITH A MEAL 180 tablet 3  . glucose blood (ONE TOUCH ULTRA TEST) test strip CHECK BLOOD SUGAR FOUR TIMES DAILY (e11.9) 400 each 3  . hydrALAZINE (APRESOLINE) 50 MG tablet TAKE 1 TABLET BY MOUTH THREE TIMES A DAY 270 tablet 1  . Insulin Isophane & Regular Human (NOVOLIN 70/30 FLEXPEN) (70-30) 100 UNIT/ML PEN Inject 50 units in the am and 30 units in pm (Patient not taking: Reported on 09/26/2018) 5 pen 3  . Insulin Pen Needle 31G X 5 MM MISC Use daily with insulin 100 each 5  . Insulin Syringe-Needle U-100 (INSULIN SYRINGE .3CC/31GX5/16") 31G X 5/16" 0.3 ML MISC Use twice daily to inject insulin. 100 each 10  . LEVEMIR FLEXTOUCH 100 UNIT/ML Pen INJECT 70 UNITS UNDER THE SKIN EVERY MORNING AND 40 UNITS AT BEDTIME 15 mL 2  . losartan (COZAAR) 25 MG tablet TAKE 1 TABLET BY MOUTH EVERY DAY 90 tablet 3  . metFORMIN (GLUCOPHAGE) 850 MG tablet TAKE 1 TABLET BY MOUTH TWICE A DAY WITH MEALS 180 tablet 5  . warfarin (COUMADIN) 10 MG tablet Take 1 tablet (10 mg total) by mouth daily. 90 tablet 0   No current facility-administered  medications on file prior to visit.    Allergies  Allergen Reactions  . Codeine Other (See Comments)    Unknown reaction, severe headach   Social History   Socioeconomic History  . Marital status: Divorced    Spouse name: Not on file  . Number of children: 2  . Years of education: Not on file  . Highest education level: Not on file  Occupational History  . Occupation: Disabled  Social Needs  . Financial resource strain: Not on file  . Food insecurity    Worry: Not on file    Inability: Not on file  . Transportation needs    Medical: Not on file    Non-medical: Not on file  Tobacco Use  . Smoking status: Smoker, Current Status Unknown  . Smokeless tobacco: Never Used  Substance and Sexual Activity  . Alcohol use: Yes    Alcohol/week: 0.0 standard drinks    Comment: seldom  . Drug use: Yes    Frequency: 7.0 times per week    Comment: marijauna for arthritis  . Sexual activity: Not on file  Lifestyle  . Physical activity    Days per week: Not on file    Minutes per session: Not on file  .  Stress: Not on file  Relationships  . Social Herbalist on phone: Not on file    Gets together: Not on file    Attends religious service: Not on file    Active member of club or organization: Not on file    Attends meetings of clubs or organizations: Not on file    Relationship status: Not on file  . Intimate partner violence    Fear of current or ex partner: Not on file    Emotionally abused: Not on file    Physically abused: Not on file    Forced sexual activity: Not on file  Other Topics Concern  . Not on file  Social History Narrative   He has children.    Review of Systems  All other systems reviewed and are negative.      Objective:   Physical Exam  Cardiovascular: Normal rate, regular rhythm and normal heart sounds.  Pulmonary/Chest: Effort normal and breath sounds normal. No respiratory distress. He has no wheezes. He has no rales. He exhibits no  tenderness.  Abdominal: Soft. Bowel sounds are normal. He exhibits no distension. There is no abdominal tenderness. There is no rebound.  Musculoskeletal:        General: Edema present.  Vitals reviewed.         Assessment & Plan:   1. Atrial fibrillation, unspecified type ([place]) INR is therapeutic.  Recheck INR in 6 weeks.  Continue Coumadin 10 mg a day - PT with INR/Fingerstick  2. Leg swelling I do not believe that this is CHF.  I believe is most likely swelling due to the amlodipine.  Begin Lasix 20 mg p.o. daily as needed leg swelling.  Recheck next week if no better.   Patient's testosterone level was normal.  He would like to try Cialis 10 to 20 mg p.o. daily as needed erectile dysfunction.

## 2018-11-02 ENCOUNTER — Other Ambulatory Visit: Payer: Self-pay | Admitting: Family Medicine

## 2018-11-24 ENCOUNTER — Other Ambulatory Visit: Payer: Self-pay | Admitting: Family Medicine

## 2018-11-24 ENCOUNTER — Ambulatory Visit (INDEPENDENT_AMBULATORY_CARE_PROVIDER_SITE_OTHER): Payer: Medicare HMO | Admitting: Family Medicine

## 2018-11-24 ENCOUNTER — Encounter: Payer: Self-pay | Admitting: Family Medicine

## 2018-11-24 ENCOUNTER — Other Ambulatory Visit: Payer: Self-pay

## 2018-11-24 DIAGNOSIS — I4891 Unspecified atrial fibrillation: Secondary | ICD-10-CM

## 2018-11-24 LAB — PT WITH INR/FINGERSTICK
INR, fingerstick: 2.4 ratio — ABNORMAL HIGH
PT, fingerstick: 29.1 s — ABNORMAL HIGH (ref 10.5–13.1)

## 2018-11-24 MED ORDER — TADALAFIL 20 MG PO TABS: 10.0000 mg | ORAL_TABLET | ORAL | 11 refills | Status: DC | PRN

## 2018-11-24 NOTE — Progress Notes (Signed)
Subjective:    Patient ID: Brian [name], male    DOB: 1955-12-09, 63 y.o.   MRN: [id number] Patient is here today to recheck his INR.  He is on Coumadin for atrial fibrillation.  INR today is therapeutic at 2.4.  He denies any bleeding or bruising or missed doses of Coumadin Past Surgical History:  Procedure Laterality Date  . Ear surgery as a child    . TONSILLECTOMY     Current Outpatient Medications on File Prior to Visit  Medication Sig Dispense Refill  . amLODipine (NORVASC) 10 MG tablet TAKE 1 TABLET BY MOUTH EVERY DAY 90 tablet 1  . atorvastatin (LIPITOR) 20 MG tablet Take 1 tablet (20 mg total) daily by mouth. 90 tablet 3  . carvedilol (COREG) 25 MG tablet TAKE 1 TABLET BY MOUTH TWICE A DAY WITH A MEAL 180 tablet 3  . furosemide (LASIX) 20 MG tablet TAKE 1 TABLET (20 MG TOTAL) BY MOUTH DAILY AS NEEDED FOR FLUID. 30 tablet 0  . glucose blood (ONE TOUCH ULTRA TEST) test strip CHECK BLOOD SUGAR FOUR TIMES DAILY (e11.9) 400 each 3  . hydrALAZINE (APRESOLINE) 50 MG tablet TAKE 1 TABLET BY MOUTH THREE TIMES A DAY 270 tablet 1  . Insulin Pen Needle 31G X 5 MM MISC Use daily with insulin 100 each 5  . Insulin Syringe-Needle U-100 (INSULIN SYRINGE .3CC/31GX5/16") 31G X 5/16" 0.3 ML MISC Use twice daily to inject insulin. 100 each 10  . LEVEMIR FLEXTOUCH 100 UNIT/ML Pen INJECT 70 UNITS UNDER THE SKIN EVERY MORNING AND 40 UNITS AT BEDTIME 15 mL 2  . losartan (COZAAR) 25 MG tablet TAKE 1 TABLET BY MOUTH EVERY DAY 90 tablet 3  . metFORMIN (GLUCOPHAGE) 850 MG tablet TAKE 1 TABLET BY MOUTH TWICE A DAY WITH MEALS 180 tablet 5  . tadalafil (CIALIS) 20 MG tablet Take 0.5-1 tablets (10-20 mg total) by mouth every other day as needed for erectile dysfunction. 5 tablet 11  . warfarin (COUMADIN) 10 MG tablet Take 1 tablet (10 mg total) by mouth daily. 90 tablet 0   No current facility-administered medications on file prior to visit.    Allergies  Allergen Reactions  . Codeine Other (See  Comments)    Unknown reaction, severe headach   Social History   Socioeconomic History  . Marital status: Divorced    Spouse name: Not on file  . Number of children: 2  . Years of education: Not on file  . Highest education level: Not on file  Occupational History  . Occupation: Disabled  Social Needs  . Financial resource strain: Not on file  . Food insecurity    Worry: Not on file    Inability: Not on file  . Transportation needs    Medical: Not on file    Non-medical: Not on file  Tobacco Use  . Smoking status: Smoker, Current Status Unknown  . Smokeless tobacco: Never Used  Substance and Sexual Activity  . Alcohol use: Yes    Alcohol/week: 0.0 standard drinks    Comment: seldom  . Drug use: Yes    Frequency: 7.0 times per week    Comment: marijauna for arthritis  . Sexual activity: Not on file  Lifestyle  . Physical activity    Days per week: Not on file    Minutes per session: Not on file  . Stress: Not on file  Relationships  . Social connections    Talks on phone: Not on file  Gets together: Not on file    Attends religious service: Not on file    Active member of club or organization: Not on file    Attends meetings of clubs or organizations: Not on file    Relationship status: Not on file  . Intimate partner violence    Fear of current or ex partner: Not on file    Emotionally abused: Not on file    Physically abused: Not on file    Forced sexual activity: Not on file  Other Topics Concern  . Not on file  Social History Narrative   He has children.    Review of Systems  All other systems reviewed and are negative.      Objective:   Physical Exam  Cardiovascular: Normal rate, regular rhythm and normal heart sounds.  Pulmonary/Chest: Effort normal and breath sounds normal. No respiratory distress. He has no wheezes. He has no rales. He exhibits no tenderness.  Abdominal: Soft. Bowel sounds are normal. He exhibits no distension. There is no  abdominal tenderness. There is no rebound.  Musculoskeletal:        General: No edema.  Vitals reviewed.         Assessment & Plan:  1. Atrial fibrillation, unspecified type ([place]) INR is therapeutic.  Continue current Coumadin dose and recheck INR in 6 weeks

## 2018-12-06 ENCOUNTER — Other Ambulatory Visit: Payer: Self-pay | Admitting: Family Medicine

## 2018-12-07 ENCOUNTER — Other Ambulatory Visit: Payer: Self-pay | Admitting: Family Medicine

## 2018-12-16 ENCOUNTER — Other Ambulatory Visit: Payer: Self-pay | Admitting: Family Medicine

## 2019-01-05 ENCOUNTER — Encounter: Payer: Self-pay | Admitting: Family Medicine

## 2019-01-05 ENCOUNTER — Other Ambulatory Visit: Payer: Self-pay

## 2019-01-05 ENCOUNTER — Ambulatory Visit (INDEPENDENT_AMBULATORY_CARE_PROVIDER_SITE_OTHER): Payer: Medicare HMO | Admitting: Family Medicine

## 2019-01-05 VITALS — BP 138/82 | HR 66 | Temp 97.8°F | Resp 16 | Ht 69.0 in | Wt 213.0 lb

## 2019-01-05 DIAGNOSIS — Z23 Encounter for immunization: Secondary | ICD-10-CM | POA: Diagnosis not present

## 2019-01-05 DIAGNOSIS — I4891 Unspecified atrial fibrillation: Secondary | ICD-10-CM | POA: Diagnosis not present

## 2019-01-05 LAB — PT WITH INR/FINGERSTICK
INR, fingerstick: 1.9 ratio — ABNORMAL HIGH
PT, fingerstick: 22.7 s — ABNORMAL HIGH (ref 10.5–13.1)

## 2019-01-05 MED ORDER — TADALAFIL 20 MG PO TABS: 10.0000 mg | ORAL_TABLET | ORAL | 11 refills | Status: DC | PRN

## 2019-01-05 NOTE — Addendum Note (Signed)
Addended by: Shary Decamp B on: 01/05/2019 09:57 AM   Modules accepted: Orders

## 2019-01-05 NOTE — Progress Notes (Signed)
Subjective:    Patient ID: Brian [name], male    DOB: [birth year], 63 y.o.   MRN: [id number] Patient is here today to recheck his INR.  He is on Coumadin for atrial fibrillation.  INR today is borderline at 1.9 but he hasn't taken coumadin 10 mg in over 24 hours.  Usually takes 10 mg poqday.  He denies any bleeding or bruising or missed doses of Coumadin Past Surgical History:  Procedure Laterality Date  . Ear surgery as a child    . TONSILLECTOMY     Current Outpatient Medications on File Prior to Visit  Medication Sig Dispense Refill  . amLODipine (NORVASC) 10 MG tablet TAKE 1 TABLET BY MOUTH EVERY DAY 90 tablet 1  . atorvastatin (LIPITOR) 20 MG tablet Take 1 tablet (20 mg total) daily by mouth. 90 tablet 3  . carvedilol (COREG) 25 MG tablet TAKE 1 TABLET BY MOUTH TWICE A DAY WITH A MEAL 180 tablet 3  . furosemide (LASIX) 20 MG tablet TAKE 1 TABLET (20 MG TOTAL) BY MOUTH DAILY AS NEEDED FOR FLUID. 30 tablet 3  . glucose blood (ONE TOUCH ULTRA TEST) test strip CHECK BLOOD SUGAR FOUR TIMES DAILY (e11.9) 400 each 3  . hydrALAZINE (APRESOLINE) 50 MG tablet TAKE 1 TABLET BY MOUTH THREE TIMES A DAY 270 tablet 1  . Insulin Pen Needle 31G X 5 MM MISC Use daily with insulin 100 each 5  . Insulin Syringe-Needle U-100 (INSULIN SYRINGE .3CC/31GX5/16") 31G X 5/16" 0.3 ML MISC Use twice daily to inject insulin. 100 each 10  . LEVEMIR FLEXTOUCH 100 UNIT/ML Pen INJECT 70 UNITS UNDER THE SKIN EVERY MORNING AND 40 UNITS AT BEDTIME 15 mL 2  . losartan (COZAAR) 25 MG tablet TAKE 1 TABLET BY MOUTH EVERY DAY 90 tablet 3  . metFORMIN (GLUCOPHAGE) 850 MG tablet TAKE 1 TABLET BY MOUTH TWICE A DAY WITH MEALS 180 tablet 5  . tadalafil (CIALIS) 20 MG tablet Take 0.5-1 tablets (10-20 mg total) by mouth every other day as needed for erectile dysfunction. 5 tablet 11  . warfarin (COUMADIN) 10 MG tablet TAKE 1 TABLET BY MOUTH EVERY DAY 90 tablet 0   No current facility-administered medications on file prior to  visit.    Allergies  Allergen Reactions  . Codeine Other (See Comments)    Unknown reaction, severe headach   Social History   Socioeconomic History  . Marital status: Divorced    Spouse name: Not on file  . Number of children: 2  . Years of education: Not on file  . Highest education level: Not on file  Occupational History  . Occupation: Disabled  Social Needs  . Financial resource strain: Not on file  . Food insecurity    Worry: Not on file    Inability: Not on file  . Transportation needs    Medical: Not on file    Non-medical: Not on file  Tobacco Use  . Smoking status: Smoker, Current Status Unknown  . Smokeless tobacco: Never Used  Substance and Sexual Activity  . Alcohol use: Yes    Alcohol/week: 0.0 standard drinks    Comment: seldom  . Drug use: Yes    Frequency: 7.0 times per week    Comment: marijauna for arthritis  . Sexual activity: Not on file  Lifestyle  . Physical activity    Days per week: Not on file    Minutes per session: Not on file  . Stress: Not on file  Relationships  .  Social Herbalist on phone: Not on file    Gets together: Not on file    Attends religious service: Not on file    Active member of club or organization: Not on file    Attends meetings of clubs or organizations: Not on file    Relationship status: Not on file  . Intimate partner violence    Fear of current or ex partner: Not on file    Emotionally abused: Not on file    Physically abused: Not on file    Forced sexual activity: Not on file  Other Topics Concern  . Not on file  Social History Narrative   He has children.    Review of Systems  All other systems reviewed and are negative.      Objective:   Physical Exam  Cardiovascular: Normal rate, regular rhythm and normal heart sounds.  Pulmonary/Chest: Effort normal and breath sounds normal. No respiratory distress. He has no wheezes. He has no rales. He exhibits no tenderness.  Abdominal: Soft.  Bowel sounds are normal. He exhibits no distension. There is no abdominal tenderness. There is no rebound.  Musculoskeletal:        General: No edema.  Vitals reviewed.         Assessment & Plan:  1. Atrial fibrillation, unspecified type ([place]) INR is therapeutic.  Continue current Coumadin dose and recheck INR in 6 weeks

## 2019-01-07 ENCOUNTER — Other Ambulatory Visit: Payer: Self-pay | Admitting: Family Medicine

## 2019-01-19 ENCOUNTER — Other Ambulatory Visit: Payer: Self-pay | Admitting: Family Medicine

## 2019-01-20 DIAGNOSIS — S43005A Unspecified dislocation of left shoulder joint, initial encounter: Secondary | ICD-10-CM | POA: Diagnosis not present

## 2019-01-20 DIAGNOSIS — Y999 Unspecified external cause status: Secondary | ICD-10-CM | POA: Diagnosis not present

## 2019-01-20 DIAGNOSIS — S299XXA Unspecified injury of thorax, initial encounter: Secondary | ICD-10-CM | POA: Diagnosis not present

## 2019-01-20 DIAGNOSIS — S43015A Anterior dislocation of left humerus, initial encounter: Secondary | ICD-10-CM | POA: Diagnosis not present

## 2019-01-20 DIAGNOSIS — W010XXA Fall on same level from slipping, tripping and stumbling without subsequent striking against object, initial encounter: Secondary | ICD-10-CM | POA: Diagnosis not present

## 2019-01-21 ENCOUNTER — Telehealth: Payer: Self-pay | Admitting: Family Medicine

## 2019-01-21 DIAGNOSIS — S43004A Unspecified dislocation of right shoulder joint, initial encounter: Secondary | ICD-10-CM

## 2019-01-21 DIAGNOSIS — S43006A Unspecified dislocation of unspecified shoulder joint, initial encounter: Secondary | ICD-10-CM

## 2019-01-21 NOTE — Telephone Encounter (Signed)
Pt called and states that he fell and dislocated his shoulder, went to hospital and they put it back and needs a referral to ortho. Does he need an apt or ok to refer?

## 2019-01-22 NOTE — Telephone Encounter (Signed)
Referral placed.

## 2019-01-22 NOTE — Telephone Encounter (Signed)
Ok to refer, no appointment needed here

## 2019-01-31 ENCOUNTER — Other Ambulatory Visit: Payer: Self-pay | Admitting: Family Medicine

## 2019-02-04 ENCOUNTER — Ambulatory Visit (INDEPENDENT_AMBULATORY_CARE_PROVIDER_SITE_OTHER): Payer: Medicare HMO

## 2019-02-04 ENCOUNTER — Encounter: Payer: Self-pay | Admitting: Orthopaedic Surgery

## 2019-02-04 ENCOUNTER — Other Ambulatory Visit: Payer: Self-pay

## 2019-02-04 ENCOUNTER — Ambulatory Visit (INDEPENDENT_AMBULATORY_CARE_PROVIDER_SITE_OTHER): Payer: Medicare HMO | Admitting: Orthopaedic Surgery

## 2019-02-04 DIAGNOSIS — S4292XA Fracture of left shoulder girdle, part unspecified, initial encounter for closed fracture: Secondary | ICD-10-CM

## 2019-02-04 DIAGNOSIS — M25512 Pain in left shoulder: Secondary | ICD-10-CM | POA: Diagnosis not present

## 2019-02-04 NOTE — Progress Notes (Signed)
Office Visit Note   Patient: Brian [name]           Date of Birth: 1956-02-17           MRN: [id number] Visit Date: 02/04/2019              Requested by: [name], MD [address] [place],   [id number] PCP: [name], MD   Assessment & Plan: Visit Diagnoses:  1. Left shoulder pain, unspecified chronicity   2. Traumatic closed displaced fracture of left shoulder with anterior dislocation, initial encounter     Plan: At this point he can come out of the sling and work on gentle shoulder motion with avoiding overhead activities and reaching behind him.  I would like to see him back in 4 weeks for repeat exam and to determine whether or not we feel he would benefit from outpatient physical therapy for his shoulder.  He agrees with the treatment plan.  All question concerns were answered and addressed.  Follow-Up Instructions: Return in about 4 weeks (around 03/04/2019).   Orders:  Orders Placed This Encounter  Procedures  . XR Shoulder Left   No orders of the defined types were placed in this encounter.     Procedures: No procedures performed   Clinical Data: No additional findings.   Subjective: Chief Complaint  Patient presents with  . Left Shoulder - Injury  The patient is a 63 year old gentleman who about 2 weeks ago tripped over an air hose while working on a car.  He sustained a left shoulder dislocation and went to [facility] hospital.  They were able to reduce his shoulder and placed in a sling.  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  He says he is wearing the sling and working on just some gentle motion of the shoulder but is not been try to reach overhead or behind.  He does report shoulder weakness.  He denies any numbness and tingling and said is not really painful to him.  HPI  Review of Systems Today he denies any chest pain, shortness of breath, fever, chills, nausea,  vomiting  Objective: Vital Signs: There were no vitals taken for this visit.  Physical Exam He is alert and orient x3 and in no acute distress Ortho Exam Examination of the left shoulder shows it is clinically well located.  He does have weakness of the rotator cuff with limited abduction.  From the elbow down he is got gross motor function and good sensation.  His hand is well-perfused. Specialty Comments:  No specialty comments available.  Imaging: XR Shoulder Left  Result Date: 02/04/2019 3 views of the left shoulder show the shoulder is well located and reduced from the previous dislocation.  There is chronic changes around the [facility] joint and between the coracoid and clavicle but the glenohumeral joint is well-maintained.    PMFS History: Patient Active Problem List   Diagnosis Date Noted  . 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 Status epilepticus ([place]) 01/18/2013  . 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 status epilepticus, not intractable ([place]) 01/15/2013  . Hyperlipidemia   . Long term current use of anticoagulant 03/11/2010  . Diabetes mellitus, type II ([place]) 12/20/2009  . Essential hypertension, benign 12/20/2009  . Atrial fibrillation ([place]) 12/20/2009  . CONGESTIVE HEART FAILURE, SYSTOLIC DYSFUNCTION 83/15/1761  . CEREBROVASCULAR ACCIDENT 12/20/2009   Past Medical History:  Diagnosis Date  .  Arthritis   . Atrial fibrillation ([place])   . Cardiomyopathy   . Cerebrovascular accident ([place])    2012  . Diabetes mellitus   . Hyperlipidemia   . Hypertension   . Left-sided weakness   . 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 Zigmund Daniel, laser therapy OD  . Seizures ([place])    pt reports this is from low blood sugar    Family History  Problem Relation Age of Onset  . Hyperlipidemia Father   . Hypertension Father   . Coronary artery disease Neg Hx        no early CAD.  Marland Kitchen Colon cancer Neg Hx   . Stomach cancer Neg Hx   . Rectal cancer Neg Hx   . Esophageal cancer Neg Hx       Past Surgical History:  Procedure Laterality Date  . Ear surgery as a child    . TONSILLECTOMY     Social History   Occupational History  . Occupation: Disabled  Tobacco Use  . Smoking status: Smoker, Current Status Unknown  . Smokeless tobacco: Never Used  Substance and Sexual Activity  . Alcohol use: Yes    Alcohol/week: 0.0 standard drinks    Comment: seldom  . Drug use: Yes    Frequency: 7.0 times per week    Comment: marijauna for arthritis  . Sexual activity: Not on file

## 2019-02-17 ENCOUNTER — Other Ambulatory Visit: Payer: Self-pay

## 2019-02-17 ENCOUNTER — Ambulatory Visit (INDEPENDENT_AMBULATORY_CARE_PROVIDER_SITE_OTHER): Payer: Medicare HMO | Admitting: Family Medicine

## 2019-02-17 VITALS — BP 140/72 | HR 71 | Temp 97.5°F | Resp 18 | Ht 69.0 in | Wt 207.0 lb

## 2019-02-17 DIAGNOSIS — R5382 Chronic fatigue, unspecified: Secondary | ICD-10-CM | POA: Diagnosis not present

## 2019-02-17 DIAGNOSIS — I4891 Unspecified atrial fibrillation: Secondary | ICD-10-CM | POA: Diagnosis not present

## 2019-02-17 DIAGNOSIS — E1122 Type 2 diabetes mellitus with diabetic chronic kidney disease: Secondary | ICD-10-CM | POA: Diagnosis not present

## 2019-02-17 DIAGNOSIS — Z7901 Long term (current) use of anticoagulants: Secondary | ICD-10-CM | POA: Diagnosis not present

## 2019-02-17 LAB — PT WITH INR/FINGERSTICK
INR, fingerstick: 2.1 ratio — ABNORMAL HIGH
PT, fingerstick: 25.2 s — ABNORMAL HIGH (ref 10.5–13.1)

## 2019-02-17 NOTE — Progress Notes (Signed)
Subjective:    Patient ID: Brian [name], male    DOB: 29-Jun-1955, 63 y.o.   MRN: [id number] Patient is here today to recheck his INR.  He is on Coumadin for atrial fibrillation.  INR today is 2.1.  Usually takes 10 mg poqday.  He denies any bleeding or bruising or missed doses of Coumadin.  Since I last saw the patient, he fell at work and dislocated his left shoulder.  He had to go the emergency room where they reduced his shoulder into position.  He is still having significant pain in his left shoulder particular with abduction greater than 90 degrees although he is following up with the orthopedist.  At the present time he is taking nothing for pain.  He is here today to recheck his diabetes as well.  His last A1c was in August.  He is currently on 70 units of insulin in the morning and 40 units of insulin in the evening.  He states that his blood sugar is typically in the 130s when he checks it.  On average his blood sugar remains between 102 100 irregardless of what time of day he checks his blood sugar.  He denies any hypoglycemia.  He denies any neuropathy in his feet.  He denies any chest pain or shortness of breath or dyspnea on exertion.  Blood pressure today is well controlled. Past Surgical History:  Procedure Laterality Date  . Ear surgery as a child    . TONSILLECTOMY     Current Outpatient Medications on File Prior to Visit  Medication Sig Dispense Refill  . amLODipine (NORVASC) 10 MG tablet TAKE 1 TABLET BY MOUTH EVERY DAY 90 tablet 1  . atorvastatin (LIPITOR) 20 MG tablet TAKE 1 TABLET (20 MG TOTAL) DAILY BY MOUTH. 90 tablet 3  . carvedilol (COREG) 25 MG tablet TAKE 1 TABLET BY MOUTH TWICE A DAY WITH A MEAL 180 tablet 3  . furosemide (LASIX) 20 MG tablet TAKE 1 TABLET (20 MG TOTAL) BY MOUTH DAILY AS NEEDED FOR FLUID. 30 tablet 3  . glucose blood (ONE TOUCH ULTRA TEST) test strip CHECK BLOOD SUGAR FOUR TIMES DAILY (e11.9) 400 each 3  . hydrALAZINE (APRESOLINE) 50 MG tablet  TAKE 1 TABLET BY MOUTH THREE TIMES A DAY 270 tablet 1  . Insulin Pen Needle 31G X 5 MM MISC Use daily with insulin 100 each 5  . Insulin Syringe-Needle U-100 (INSULIN SYRINGE .3CC/31GX5/16") 31G X 5/16" 0.3 ML MISC Use twice daily to inject insulin. 100 each 10  . LEVEMIR FLEXTOUCH 100 UNIT/ML Pen INJECT 70 UNITS UNDER THE SKIN EVERY MORNING AND 40 UNITS AT BEDTIME 15 mL 3  . losartan (COZAAR) 25 MG tablet TAKE 1 TABLET BY MOUTH EVERY DAY 90 tablet 3  . metFORMIN (GLUCOPHAGE) 850 MG tablet TAKE 1 TABLET BY MOUTH TWICE A DAY WITH MEALS 180 tablet 5  . tadalafil (CIALIS) 20 MG tablet Take 0.5-1 tablets (10-20 mg total) by mouth every other day as needed for erectile dysfunction. 5 tablet 11  . tadalafil (CIALIS) 20 MG tablet Take 0.5-1 tablets (10-20 mg total) by mouth every other day as needed for erectile dysfunction. 5 tablet 11  . warfarin (COUMADIN) 10 MG tablet TAKE 1 TABLET BY MOUTH EVERY DAY 90 tablet 0   No current facility-administered medications on file prior to visit.   Allergies  Allergen Reactions  . Codeine Other (See Comments)    Unknown reaction, severe headach   Social History  Socioeconomic History  . Marital status: Divorced    Spouse name: Not on file  . Number of children: 2  . Years of education: Not on file  . Highest education level: Not on file  Occupational History  . Occupation: Disabled  Tobacco Use  . Smoking status: Smoker, Current Status Unknown  . Smokeless tobacco: Never Used  Substance and Sexual Activity  . Alcohol use: Yes    Alcohol/week: 0.0 standard drinks    Comment: seldom  . Drug use: Yes    Frequency: 7.0 times per week    Comment: marijauna for arthritis  . Sexual activity: Not on file  Other Topics Concern  . Not on file  Social History Narrative   He has children.   Social Determinants of Health   Financial Resource Strain:   . Difficulty of Paying Living Expenses: Not on file  Food Insecurity:   . Worried About Paediatric nurse in the Last Year: Not on file  . Ran Out of Food in the Last Year: Not on file  Transportation Needs:   . Lack of Transportation (Medical): Not on file  . Lack of Transportation (Non-Medical): Not on file  Physical Activity:   . Days of Exercise per Week: Not on file  . Minutes of Exercise per Session: Not on file  Stress:   . Feeling of Stress : Not on file  Social Connections:   . Frequency of Communication with Friends and Family: Not on file  . Frequency of Social Gatherings with Friends and Family: Not on file  . Attends Religious Services: Not on file  . Active Member of Clubs or Organizations: Not on file  . Attends Archivist Meetings: Not on file  . Marital Status: Not on file  Intimate Partner Violence:   . Fear of Current or Ex-Partner: Not on file  . Emotionally Abused: Not on file  . Physically Abused: Not on file  . Sexually Abused: Not on file    Review of Systems  All other systems reviewed and are negative.      Objective:   Physical Exam  Cardiovascular: Normal rate, regular rhythm and normal heart sounds.  Pulmonary/Chest: Effort normal and breath sounds normal. No respiratory distress. He has no wheezes. He has no rales. He exhibits no tenderness.  Abdominal: Soft. Bowel sounds are normal. He exhibits no distension. There is no abdominal tenderness. There is no rebound.  Musculoskeletal:        General: No edema.  Vitals reviewed.         Assessment & Plan:  Atrial fibrillation, unspecified type ([place]) - Plan: PT with INR/Fingerstick  Controlled type 2 diabetes mellitus with chronic kidney disease, unspecified CKD stage, unspecified whether long term insulin use ([place]) - Plan: Hemoglobin A1c, CBC with Differential, COMPLETE METABOLIC PANEL WITH GFR, Lipid Panel, Microalbumin, urine  I am very happy with his reported blood sugars.  Blood pressure today is well controlled.  Check hemoglobin A1c.  Ideally I like his hemoglobin A1c  to be less than 7.  Also check fasting lipid panel.  Goal LDL cholesterol is less than 100.  Check urine microalbumin.  Recommended smoking cessation. INR is therapeutic.  Continue current Coumadin dose and recheck INR in 6 weeks

## 2019-02-18 LAB — CBC WITH DIFFERENTIAL/PLATELET
Absolute Monocytes: 692 cells/uL (ref 200–950)
Basophils Absolute: 23 cells/uL (ref 0–200)
Basophils Relative: 0.3 %
Eosinophils Absolute: 91 cells/uL (ref 15–500)
Eosinophils Relative: 1.2 %
HCT: 43.2 % (ref 38.5–50.0)
Hemoglobin: 14.4 g/dL (ref 13.2–17.1)
Lymphs Abs: 1680 cells/uL (ref 850–3900)
MCH: 28.5 pg (ref 27.0–33.0)
MCHC: 33.3 g/dL (ref 32.0–36.0)
MCV: 85.5 fL (ref 80.0–100.0)
MPV: 9.5 fL (ref 7.5–12.5)
Monocytes Relative: 9.1 %
Neutro Abs: 5115 cells/uL (ref 1500–7800)
Neutrophils Relative %: 67.3 %
Platelets: 242 10*3/uL (ref 140–400)
RBC: 5.05 10*6/uL (ref 4.20–5.80)
RDW: 13.5 % (ref 11.0–15.0)
Total Lymphocyte: 22.1 %
WBC: 7.6 10*3/uL (ref 3.8–10.8)

## 2019-02-18 LAB — COMPLETE METABOLIC PANEL WITH GFR
AG Ratio: 1.6 (calc) (ref 1.0–2.5)
ALT: 16 U/L (ref 9–46)
AST: 15 U/L (ref 10–35)
Albumin: 4.3 g/dL (ref 3.6–5.1)
Alkaline phosphatase (APISO): 67 U/L (ref 35–144)
BUN/Creatinine Ratio: 13 (calc) (ref 6–22)
BUN: 17 mg/dL (ref 7–25)
CO2: 24 mmol/L (ref 20–32)
Calcium: 9.5 mg/dL (ref 8.6–10.3)
Chloride: 105 mmol/L (ref 98–110)
Creat: 1.27 mg/dL — ABNORMAL HIGH (ref 0.70–1.25)
GFR, Est African American: 68 mL/min/{1.73_m2} (ref >=60)
GFR, Est Non African American: 59 mL/min/{1.73_m2} — ABNORMAL LOW (ref >=60)
Globulin: 2.7 g/dL (calc) (ref 1.9–3.7)
Glucose, Bld: 124 mg/dL — ABNORMAL HIGH (ref 65–99)
Potassium: 4.5 mmol/L (ref 3.5–5.3)
Sodium: 138 mmol/L (ref 135–146)
Total Bilirubin: 0.4 mg/dL (ref 0.2–1.2)
Total Protein: 7 g/dL (ref 6.1–8.1)

## 2019-02-18 LAB — MICROALBUMIN, URINE: Microalb, Ur: 6.7 mg/dL

## 2019-02-18 LAB — LIPID PANEL
Cholesterol: 180 mg/dL (ref <200)
HDL: 31 mg/dL — ABNORMAL LOW (ref >=40)
LDL Cholesterol (Calc): 114 mg/dL (calc) — ABNORMAL HIGH
Non-HDL Cholesterol (Calc): 149 mg/dL (calc) — ABNORMAL HIGH (ref <130)
Total CHOL/HDL Ratio: 5.8 (calc) — ABNORMAL HIGH (ref <5.0)
Triglycerides: 228 mg/dL — ABNORMAL HIGH (ref <150)

## 2019-02-18 LAB — HEMOGLOBIN A1C
Hgb A1c MFr Bld: 8.9 % of total Hgb — ABNORMAL HIGH (ref <5.7)
Mean Plasma Glucose: 209 (calc)
eAG (mmol/L): 11.6 (calc)

## 2019-02-20 DIAGNOSIS — C801 Malignant (primary) neoplasm, unspecified: Secondary | ICD-10-CM

## 2019-02-20 HISTORY — DX: Malignant (primary) neoplasm, unspecified: C80.1

## 2019-03-03 ENCOUNTER — Other Ambulatory Visit: Payer: Self-pay

## 2019-03-03 MED ORDER — DULAGLUTIDE 0.75 MG/0.5ML ~~LOC~~ SOAJ: 0.7500 mg | Freq: Every day | SUBCUTANEOUS | 0 refills | Status: DC

## 2019-03-04 ENCOUNTER — Other Ambulatory Visit: Payer: Self-pay | Admitting: Family Medicine

## 2019-03-04 MED ORDER — TRULICITY 0.75 MG/0.5ML ~~LOC~~ SOAJ: 0.7500 mg | SUBCUTANEOUS | 0 refills | Status: DC

## 2019-03-24 ENCOUNTER — Other Ambulatory Visit: Payer: Self-pay | Admitting: Family Medicine

## 2019-04-06 ENCOUNTER — Other Ambulatory Visit: Payer: Self-pay | Admitting: Family Medicine

## 2019-04-09 ENCOUNTER — Other Ambulatory Visit: Payer: Self-pay | Admitting: Family Medicine

## 2019-04-21 ENCOUNTER — Other Ambulatory Visit: Payer: Self-pay

## 2019-04-21 ENCOUNTER — Ambulatory Visit: Payer: Medicare HMO | Admitting: Family Medicine

## 2019-04-21 ENCOUNTER — Encounter: Payer: Self-pay | Admitting: Family Medicine

## 2019-04-21 ENCOUNTER — Ambulatory Visit (INDEPENDENT_AMBULATORY_CARE_PROVIDER_SITE_OTHER): Payer: Medicare Other | Admitting: Family Medicine

## 2019-04-21 VITALS — BP 160/98 | HR 76 | Temp 97.8°F | Resp 18 | Ht 69.0 in | Wt 206.0 lb

## 2019-04-21 DIAGNOSIS — I4891 Unspecified atrial fibrillation: Secondary | ICD-10-CM | POA: Diagnosis not present

## 2019-04-21 DIAGNOSIS — E119 Type 2 diabetes mellitus without complications: Secondary | ICD-10-CM | POA: Diagnosis not present

## 2019-04-21 DIAGNOSIS — Z794 Long term (current) use of insulin: Secondary | ICD-10-CM

## 2019-04-21 LAB — PT WITH INR/FINGERSTICK
INR, fingerstick: 2 ratio — ABNORMAL HIGH
PT, fingerstick: 23.7 s — ABNORMAL HIGH (ref 10.5–13.1)

## 2019-04-21 MED ORDER — LOSARTAN POTASSIUM 25 MG PO TABS: 25.0000 mg | ORAL_TABLET | Freq: Every day | ORAL | 3 refills | Status: DC

## 2019-04-21 NOTE — Progress Notes (Signed)
Subjective:    Patient ID: Brian [name], male    DOB: 1955/09/11, 64 y.o.   MRN: [id number]  Patient is here today to recheck his INR.  He is on Coumadin for atrial fibrillation.  INR today is 2.0.  Usually takes 10 mg poqday.  He denies any bleeding or bruising or missed doses of Coumadin.  At his last visit, HgA1c was 8.9 and I recommended adding trulicity 1.5 mg sq weekly.  Patient's INR today is therapeutic however his blood pressure is elevated at 160/98.  He admits that he has not been taking his losartan and he has not even taken his other blood pressure medicines today.  He denies any chest pain or shortness of breath or dyspnea on exertion.  At his last visit his hemoglobin A1c was elevated.  However he never started the Trulicity.  He states that it is too expensive however he never even price the medication.  He states that he is going to try to lower his blood sugars by decreasing his consumption of hashbrowns.  I explained to him that with an A1c of 9, I do not think that simply reducing hashbrowns with breakfast would be sufficient.  I believe that we are going to have to do something more aggressive than this.  Patient refuses to check his blood sugar on a daily basis and therefore adding insulin is potentially dangerous. Past Surgical History:  Procedure Laterality Date  . Ear surgery as a child    . TONSILLECTOMY     Current Outpatient Medications on File Prior to Visit  Medication Sig Dispense Refill  . amLODipine (NORVASC) 10 MG tablet TAKE 1 TABLET BY MOUTH EVERY DAY 90 tablet 1  . atorvastatin (LIPITOR) 20 MG tablet TAKE 1 TABLET (20 MG TOTAL) DAILY BY MOUTH. 90 tablet 3  . carvedilol (COREG) 25 MG tablet TAKE 1 TABLET BY MOUTH TWICE A DAY WITH A MEAL 180 tablet 3  . Dulaglutide (TRULICITY) 4.33 IR/5.1OA SOPN Inject 0.75 mg into the skin once a week. 4 pen 0  . furosemide (LASIX) 20 MG tablet TAKE 1 TABLET (20 MG TOTAL) BY MOUTH DAILY AS NEEDED FOR FLUID. 90 tablet 1  .  glucose blood (ONE TOUCH ULTRA TEST) test strip CHECK BLOOD SUGAR FOUR TIMES DAILY (e11.9) 400 each 3  . hydrALAZINE (APRESOLINE) 50 MG tablet TAKE 1 TABLET BY MOUTH THREE TIMES A DAY 270 tablet 1  . Insulin Pen Needle (B-D UF III MINI PEN NEEDLES) 31G X 5 MM MISC USE DAILY WITH INSULIN 100 each 5  . Insulin Syringe-Needle U-100 (INSULIN SYRINGE .3CC/31GX5/16") 31G X 5/16" 0.3 ML MISC Use twice daily to inject insulin. 100 each 10  . LEVEMIR FLEXTOUCH 100 UNIT/ML Pen INJECT 70 UNITS UNDER THE SKIN EVERY MORNING AND 40 UNITS AT BEDTIME 15 mL 1  . losartan (COZAAR) 25 MG tablet TAKE 1 TABLET BY MOUTH EVERY DAY 90 tablet 3  . metFORMIN (GLUCOPHAGE) 850 MG tablet TAKE 1 TABLET BY MOUTH TWICE A DAY WITH MEALS 180 tablet 5  . tadalafil (CIALIS) 20 MG tablet Take 0.5-1 tablets (10-20 mg total) by mouth every other day as needed for erectile dysfunction. 5 tablet 11  . warfarin (COUMADIN) 10 MG tablet TAKE 1 TABLET BY MOUTH EVERY DAY 90 tablet 0   No current facility-administered medications on file prior to visit.   Allergies  Allergen Reactions  . Codeine Other (See Comments)    Unknown reaction, severe headach   Social History  Socioeconomic History  . Marital status: Divorced    Spouse name: Not on file  . Number of children: 2  . Years of education: Not on file  . Highest education level: Not on file  Occupational History  . Occupation: Disabled  Tobacco Use  . Smoking status: Smoker, Current Status Unknown  . Smokeless tobacco: Never Used  Substance and Sexual Activity  . Alcohol use: Yes    Alcohol/week: 0.0 standard drinks    Comment: seldom  . Drug use: Yes    Frequency: 7.0 times per week    Comment: marijauna for arthritis  . Sexual activity: Not on file  Other Topics Concern  . Not on file  Social History Narrative   He has children.   Social Determinants of Health   Financial Resource Strain:   . Difficulty of Paying Living Expenses: Not on file  Food Insecurity:    . Worried About Charity fundraiser in the Last Year: Not on file  . Ran Out of Food in the Last Year: Not on file  Transportation Needs:   . Lack of Transportation (Medical): Not on file  . Lack of Transportation (Non-Medical): Not on file  Physical Activity:   . Days of Exercise per Week: Not on file  . Minutes of Exercise per Session: Not on file  Stress:   . Feeling of Stress : Not on file  Social Connections:   . Frequency of Communication with Friends and Family: Not on file  . Frequency of Social Gatherings with Friends and Family: Not on file  . Attends Religious Services: Not on file  . Active Member of Clubs or Organizations: Not on file  . Attends Archivist Meetings: Not on file  . Marital Status: Not on file  Intimate Partner Violence:   . Fear of Current or Ex-Partner: Not on file  . Emotionally Abused: Not on file  . Physically Abused: Not on file  . Sexually Abused: Not on file    Review of Systems  All other systems reviewed and are negative.      Objective:   Physical Exam  Cardiovascular: Normal rate, regular rhythm and normal heart sounds.  Pulmonary/Chest: Effort normal and breath sounds normal. No respiratory distress. He has no wheezes. He has no rales. He exhibits no tenderness.  Abdominal: Soft. Bowel sounds are normal. He exhibits no distension. There is no abdominal tenderness. There is no rebound.  Musculoskeletal:        General: No edema.  Vitals reviewed.         Assessment & Plan:  Type 2 diabetes mellitus without complication, with long-term current use of insulin (HCC)  Atrial fibrillation, unspecified type ([place]) - Plan: PT with INR/Fingerstick  Blood pressure is not well controlled.  Take his medication as soon as he gets home and resume losartan 25 mg a day and recheck blood pressure in 2 weeks.  INR is therapeutic.  Recheck INR in 6 weeks.  Diabetes is poorly controlled.  I strongly recommended either the Trulicity or  using Actos 30 mg a day as an insulin sensitizer.  We discussed the risk and benefits of both options including weight gain, swelling, nausea, cost.  Patient would like to price the Trulicity first and then he will let me know his decision.

## 2019-05-14 ENCOUNTER — Other Ambulatory Visit: Payer: Self-pay | Admitting: Family Medicine

## 2019-05-24 ENCOUNTER — Other Ambulatory Visit: Payer: Self-pay | Admitting: Family Medicine

## 2019-05-25 ENCOUNTER — Other Ambulatory Visit: Payer: Self-pay | Admitting: Family Medicine

## 2019-06-02 ENCOUNTER — Ambulatory Visit (INDEPENDENT_AMBULATORY_CARE_PROVIDER_SITE_OTHER): Payer: Medicare Other | Admitting: Family Medicine

## 2019-06-02 ENCOUNTER — Other Ambulatory Visit: Payer: Self-pay

## 2019-06-02 ENCOUNTER — Encounter: Payer: Self-pay | Admitting: Family Medicine

## 2019-06-02 VITALS — BP 110/70 | HR 68 | Temp 97.9°F | Resp 18 | Ht 69.0 in | Wt 210.0 lb

## 2019-06-02 DIAGNOSIS — I4891 Unspecified atrial fibrillation: Secondary | ICD-10-CM | POA: Diagnosis not present

## 2019-06-02 DIAGNOSIS — Z794 Long term (current) use of insulin: Secondary | ICD-10-CM

## 2019-06-02 DIAGNOSIS — E119 Type 2 diabetes mellitus without complications: Secondary | ICD-10-CM

## 2019-06-02 LAB — PT WITH INR/FINGERSTICK
INR, fingerstick: 2.4 ratio — ABNORMAL HIGH
PT, fingerstick: 28.9 s — ABNORMAL HIGH (ref 10.5–13.1)

## 2019-06-02 NOTE — Progress Notes (Signed)
Subjective:    Patient ID: Brian [name], male    DOB: [birth year], 64 y.o.   MRN: [id number] 04/21/19 Patient is here today to recheck his INR.  He is on Coumadin for atrial fibrillation.  INR today is 2.0.  Usually takes 10 mg poqday.  He denies any bleeding or bruising or missed doses of Coumadin.  At his last visit, HgA1c was 8.9 and I recommended adding trulicity 1.5 mg sq weekly.  Patient's INR today is therapeutic however his blood pressure is elevated at 160/98.  He admits that he has not been taking his losartan and he has not even taken his other blood pressure medicines today.  He denies any chest pain or shortness of breath or dyspnea on exertion.  At his last visit his hemoglobin A1c was elevated.  However he never started the Trulicity.  He states that it is too expensive however he never even price the medication.  He states that he is going to try to lower his blood sugars by decreasing his consumption of hashbrowns.  I explained to him that with an A1c of 9, I do not think that simply reducing hashbrowns with breakfast would be sufficient.  I believe that we are going to have to do something more aggressive than this.  Patient refuses to check his blood sugar on a daily basis and therefore adding insulin is potentially dangerous.  At that time, my plan was: Blood pressure is not well controlled.  Take his medication as soon as he gets home and resume losartan 25 mg a day and recheck blood pressure in 2 weeks.  INR is therapeutic.  Recheck INR in 6 weeks.  Diabetes is poorly controlled.  I strongly recommended either the Trulicity or using Actos 30 mg a day as an insulin sensitizer.  We discussed the risk and benefits of both options including weight gain, swelling, nausea, cost.  Patient would like to price the Trulicity first and then he will let me know his decision.  06/02/19 Patient presents today to recheck his INR.  His INR is therapeutic at 2.4.  Thankfully his blood pressure is  much improved today at 110/70.  He has not had a Covid vaccination.  I spent 10 minutes today discussing the Covid vaccination with the patient and explaining my rationale for him receiving the vaccine.  I also gave him contact information and explained how to schedule a vaccine.  He is also due for a booster on Pneumovax 23 however I recommended deferring that until after he gets the Covid vaccination.  He has yet to start Trulicity because he did not understand how to use the pen.  My nurse spent 5 minutes with the patient today educating him on proper pin technique and how to administer the medication.  Otherwise he is doing well.  He denies any complaints. Past Surgical History:  Procedure Laterality Date  . Ear surgery as a child    . TONSILLECTOMY     Current Outpatient Medications on File Prior to Visit  Medication Sig Dispense Refill  . amLODipine (NORVASC) 10 MG tablet TAKE 1 TABLET BY MOUTH EVERY DAY 90 tablet 1  . atorvastatin (LIPITOR) 20 MG tablet TAKE 1 TABLET (20 MG TOTAL) DAILY BY MOUTH. 90 tablet 3  . carvedilol (COREG) 25 MG tablet TAKE 1 TABLET BY MOUTH TWICE A DAY WITH A MEAL 180 tablet 3  . furosemide (LASIX) 20 MG tablet TAKE 1 TABLET (20 MG TOTAL) BY MOUTH DAILY  AS NEEDED FOR FLUID. 90 tablet 1  . glucose blood (ONE TOUCH ULTRA TEST) test strip CHECK BLOOD SUGAR FOUR TIMES DAILY (e11.9) 400 each 3  . hydrALAZINE (APRESOLINE) 50 MG tablet TAKE 1 TABLET BY MOUTH THREE TIMES A DAY 270 tablet 1  . Insulin Pen Needle (B-D UF III MINI PEN NEEDLES) 31G X 5 MM MISC USE DAILY WITH INSULIN 100 each 5  . Insulin Syringe-Needle U-100 (INSULIN SYRINGE .3CC/31GX5/16") 31G X 5/16" 0.3 ML MISC Use twice daily to inject insulin. 100 each 10  . LEVEMIR FLEXTOUCH 100 UNIT/ML FlexPen INJECT 70 UNITS UNDER THE SKIN EVERY MORNING AND 40 UNITS AT BEDTIME 15 mL 1  . losartan (COZAAR) 25 MG tablet Take 1 tablet (25 mg total) by mouth daily. 90 tablet 3  . tadalafil (CIALIS) 20 MG tablet Take 0.5-1  tablets (10-20 mg total) by mouth every other day as needed for erectile dysfunction. 5 tablet 11  . TRULICITY 3.15 VV/6.1YW SOPN INJECT 0.75 MG INTO THE SKIN ONCE A WEEK. 4 pen 11  . warfarin (COUMADIN) 10 MG tablet TAKE 1 TABLET BY MOUTH EVERY DAY 90 tablet 0   No current facility-administered medications on file prior to visit.   Allergies  Allergen Reactions  . Codeine Other (See Comments)    Unknown reaction, severe headach   Social History   Socioeconomic History  . Marital status: Divorced    Spouse name: Not on file  . Number of children: 2  . Years of education: Not on file  . Highest education level: Not on file  Occupational History  . Occupation: Disabled  Tobacco Use  . Smoking status: Smoker, Current Status Unknown  . Smokeless tobacco: Never Used  Substance and Sexual Activity  . Alcohol use: Yes    Alcohol/week: 0.0 standard drinks    Comment: seldom  . Drug use: Yes    Frequency: 7.0 times per week    Comment: marijauna for arthritis  . Sexual activity: Not on file  Other Topics Concern  . Not on file  Social History Narrative   He has children.   Social Determinants of Health   Financial Resource Strain:   . Difficulty of Paying Living Expenses:   Food Insecurity:   . Worried About Charity fundraiser in the Last Year:   . Arboriculturist in the Last Year:   Transportation Needs:   . Film/video editor (Medical):   Marland Kitchen Lack of Transportation (Non-Medical):   Physical Activity:   . Days of Exercise per Week:   . Minutes of Exercise per Session:   Stress:   . Feeling of Stress :   Social Connections:   . Frequency of Communication with Friends and Family:   . Frequency of Social Gatherings with Friends and Family:   . Attends Religious Services:   . Active Member of Clubs or Organizations:   . Attends Archivist Meetings:   Marland Kitchen Marital Status:   Intimate Partner Violence:   . Fear of Current or Ex-Partner:   . Emotionally Abused:    Marland Kitchen Physically Abused:   . Sexually Abused:     Review of Systems  All other systems reviewed and are negative.      Objective:   Physical Exam  Cardiovascular: Normal rate, regular rhythm and normal heart sounds.  Pulmonary/Chest: Effort normal and breath sounds normal. No respiratory distress. He has no wheezes. He has no rales. He exhibits no tenderness.  Abdominal: Soft. Bowel sounds  are normal. He exhibits no distension. There is no abdominal tenderness. There is no rebound.  Musculoskeletal:        General: No edema.  Vitals reviewed.         Assessment & Plan:  Type 2 diabetes mellitus without complication, with long-term current use of insulin (HCC)  Atrial fibrillation, unspecified type ([place]) - Plan: PT with INR/Fingerstick  INR is therapeutic.  Recommended the patient start Trulicity and start checking his fasting blood sugars and 2-hour postprandial sugars.  May need to decrease his insulin dose if he starts to see hypoglycemia.  Blood pressure today is well controlled.  Recommended the Covid vaccination.  Recheck INR in 6 weeks.  Educated the patient on how to use the Trulicity pen.

## 2019-07-01 ENCOUNTER — Other Ambulatory Visit: Payer: Self-pay | Admitting: Family Medicine

## 2019-07-01 MED ORDER — WARFARIN SODIUM 10 MG PO TABS: 10.0000 mg | ORAL_TABLET | Freq: Every day | ORAL | 3 refills | Status: DC

## 2019-07-13 ENCOUNTER — Other Ambulatory Visit: Payer: Self-pay | Admitting: Family Medicine

## 2019-07-13 ENCOUNTER — Other Ambulatory Visit: Payer: Self-pay

## 2019-07-13 ENCOUNTER — Telehealth: Payer: Self-pay | Admitting: Family Medicine

## 2019-07-13 MED ORDER — LEVEMIR FLEXTOUCH 100 UNIT/ML ~~LOC~~ SOPN: PEN_INJECTOR | SUBCUTANEOUS | 1 refills | Status: DC

## 2019-07-13 NOTE — Telephone Encounter (Signed)
Patient called requesting a refill on his levemir.  CB# [phone]

## 2019-07-14 ENCOUNTER — Ambulatory Visit: Payer: Medicare Other | Admitting: Family Medicine

## 2019-07-16 MED ORDER — LEVEMIR FLEXTOUCH 100 UNIT/ML ~~LOC~~ SOPN: PEN_INJECTOR | SUBCUTANEOUS | 1 refills | Status: DC

## 2019-07-16 NOTE — Telephone Encounter (Signed)
Prescription sent to pharmacy.

## 2019-07-21 ENCOUNTER — Other Ambulatory Visit: Payer: Self-pay

## 2019-07-21 MED ORDER — AMLODIPINE BESYLATE 10 MG PO TABS: 10.0000 mg | ORAL_TABLET | Freq: Every day | ORAL | 1 refills | Status: DC

## 2019-08-03 ENCOUNTER — Encounter: Payer: Self-pay | Admitting: Family Medicine

## 2019-08-03 ENCOUNTER — Other Ambulatory Visit: Payer: Self-pay

## 2019-08-03 ENCOUNTER — Ambulatory Visit (INDEPENDENT_AMBULATORY_CARE_PROVIDER_SITE_OTHER): Payer: Medicare Other | Admitting: Family Medicine

## 2019-08-03 VITALS — BP 150/88 | HR 75 | Temp 98.4°F | Ht 69.0 in | Wt 207.0 lb

## 2019-08-03 DIAGNOSIS — I4891 Unspecified atrial fibrillation: Secondary | ICD-10-CM | POA: Diagnosis not present

## 2019-08-03 DIAGNOSIS — E119 Type 2 diabetes mellitus without complications: Secondary | ICD-10-CM | POA: Diagnosis not present

## 2019-08-03 DIAGNOSIS — Z794 Long term (current) use of insulin: Secondary | ICD-10-CM

## 2019-08-03 LAB — PT WITH INR/FINGERSTICK
INR, fingerstick: 1.8 ratio — ABNORMAL HIGH
PT, fingerstick: 21.5 s — ABNORMAL HIGH (ref 10.5–13.1)

## 2019-08-03 NOTE — Progress Notes (Signed)
Subjective:    Patient ID: Brian [name], male    DOB: [birth year], 64 y.o.   MRN: [id number] Patient is a very pleasant 64 year old white male here today to recheck his INR.  INR is subtherapeutic at 1.8.  He is taking 10 mg a day of Coumadin and has not missed any doses.  His blood pressure is elevated today at 150/88 however he states that he has not taken his blood pressure medicine yet this morning.  He states that he is checking his blood pressure at home and is consistently 1 66-6 40 systolic.  He is not taking Trulicity and he is overdue to check his A1c.  Diabetic foot exam was performed today and shows normal pulses 2/4 at the dorsalis pedis and posterior tibialis however he has normal sensation to 10 g monofilament but does have long dystrophic toenails which are curling under his toes and growing into the plantar surfaces of the toes.  He states that he is able to cut them at home and the does not require a podiatry consult.  I strongly encouraged him to do this to avoid ulcer formation. Past Surgical History:  Procedure Laterality Date  . Ear surgery as a child    . TONSILLECTOMY     Current Outpatient Medications on File Prior to Visit  Medication Sig Dispense Refill  . amLODipine (NORVASC) 10 MG tablet Take 1 tablet (10 mg total) by mouth daily. 90 tablet 1  . atorvastatin (LIPITOR) 20 MG tablet TAKE 1 TABLET (20 MG TOTAL) DAILY BY MOUTH. 90 tablet 3  . carvedilol (COREG) 25 MG tablet TAKE 1 TABLET BY MOUTH TWICE A DAY WITH A MEAL 180 tablet 3  . glucose blood (ONE TOUCH ULTRA TEST) test strip CHECK BLOOD SUGAR FOUR TIMES DAILY (e11.9) 400 each 3  . hydrALAZINE (APRESOLINE) 50 MG tablet TAKE 1 TABLET BY MOUTH THREE TIMES A DAY 270 tablet 1  . insulin detemir (LEVEMIR FLEXTOUCH) 100 UNIT/ML FlexPen INJECT 70 UNITS UNDER THE SKIN EVERY MORNING AND 40 UNITS AT BEDTIME 15 mL 1  . Insulin Pen Needle (B-D UF III MINI PEN NEEDLES) 31G X 5 MM MISC USE DAILY WITH INSULIN 100 each 5  .  Insulin Syringe-Needle U-100 (INSULIN SYRINGE .3CC/31GX5/16") 31G X 5/16" 0.3 ML MISC Use twice daily to inject insulin. 100 each 10  . losartan (COZAAR) 25 MG tablet Take 1 tablet (25 mg total) by mouth daily. 90 tablet 3  . warfarin (COUMADIN) 10 MG tablet Take 1 tablet (10 mg total) by mouth daily. 90 tablet 3  . furosemide (LASIX) 20 MG tablet TAKE 1 TABLET (20 MG TOTAL) BY MOUTH DAILY AS NEEDED FOR FLUID. (Patient not taking: Reported on 08/03/2019) 90 tablet 1  . tadalafil (CIALIS) 20 MG tablet Take 0.5-1 tablets (10-20 mg total) by mouth every other day as needed for erectile dysfunction. (Patient not taking: Reported on 08/03/2019) 5 tablet 11  . TRULICITY 0.73 XT/0.6YI SOPN INJECT 0.75 MG INTO THE SKIN ONCE A WEEK. (Patient not taking: Reported on 08/03/2019) 4 pen 11   No current facility-administered medications on file prior to visit.   Allergies  Allergen Reactions  . Codeine Other (See Comments)    Unknown reaction, severe headach   Social History   Socioeconomic History  . Marital status: Divorced    Spouse name: Not on file  . Number of children: 2  . Years of education: Not on file  . Highest education level: Not on file  Occupational History  . Occupation: Disabled  Tobacco Use  . Smoking status: Smoker, Current Status Unknown  . Smokeless tobacco: Never Used  Substance and Sexual Activity  . Alcohol use: Yes    Alcohol/week: 0.0 standard drinks    Comment: seldom  . Drug use: Yes    Frequency: 7.0 times per week    Comment: marijauna for arthritis  . Sexual activity: Not on file  Other Topics Concern  . Not on file  Social History Narrative   He has children.   Social Determinants of Health   Financial Resource Strain:   . Difficulty of Paying Living Expenses:   Food Insecurity:   . Worried About Charity fundraiser in the Last Year:   . Arboriculturist in the Last Year:   Transportation Needs:   . Film/video editor (Medical):   Marland Kitchen Lack of  Transportation (Non-Medical):   Physical Activity:   . Days of Exercise per Week:   . Minutes of Exercise per Session:   Stress:   . Feeling of Stress :   Social Connections:   . Frequency of Communication with Friends and Family:   . Frequency of Social Gatherings with Friends and Family:   . Attends Religious Services:   . Active Member of Clubs or Organizations:   . Attends Archivist Meetings:   Marland Kitchen Marital Status:   Intimate Partner Violence:   . Fear of Current or Ex-Partner:   . Emotionally Abused:   Marland Kitchen Physically Abused:   . Sexually Abused:     Review of Systems  All other systems reviewed and are negative.      Objective:   Physical Exam Vitals reviewed.  Cardiovascular:     Rate and Rhythm: Normal rate and regular rhythm.     Heart sounds: Normal heart sounds.  Pulmonary:     Effort: Pulmonary effort is normal. No respiratory distress.     Breath sounds: Normal breath sounds. No wheezing or rales.  Chest:     Chest wall: No tenderness.  Abdominal:     General: Bowel sounds are normal. There is no distension.     Palpations: Abdomen is soft.     Tenderness: There is no abdominal tenderness. There is no rebound.  Musculoskeletal:     Right lower leg: Edema present.     Left lower leg: Edema present.           Assessment & Plan:  Atrial fibrillation, unspecified type ([place]) - Plan: PT with INR/Fingerstick  Type 2 diabetes mellitus without complication, with long-term current use of insulin (HCC) - Plan: Hemoglobin A1c, COMPLETE METABOLIC PANEL WITH GFR  INR is subtherapeutic.  I recommended that he take an extra 5 mg of Coumadin today and then resume 10 mg a day as he has been doing as he is state therapeutic for the last 6 months.  Recheck INR in 6 weeks.  Blood pressure is elevated.  I encouraged the patient to check his blood pressure every day and report the values to me.  If consistently greater than 962 systolic I would increase his losartan  to 50 mg a day.  Check a hemoglobin A1c along with a CMP.  He appears to be retaining fluid so I recommended that he resume his Lasix which he has not been taking 20 mg a day.  I also strongly encouraged the patient to trim his toenails to avoid ulcer formation.  If he is unable to do  this I will be glad to consult podiatrist however the patient states that he can accomplish this at home and declines a podiatry consult.

## 2019-08-04 LAB — COMPLETE METABOLIC PANEL WITH GFR
AG Ratio: 1.5 (calc) (ref 1.0–2.5)
ALT: 12 U/L (ref 9–46)
AST: 10 U/L (ref 10–35)
Albumin: 4.1 g/dL (ref 3.6–5.1)
Alkaline phosphatase (APISO): 71 U/L (ref 35–144)
BUN/Creatinine Ratio: 16 (calc) (ref 6–22)
BUN: 21 mg/dL (ref 7–25)
CO2: 24 mmol/L (ref 20–32)
Calcium: 9.1 mg/dL (ref 8.6–10.3)
Chloride: 105 mmol/L (ref 98–110)
Creat: 1.32 mg/dL — ABNORMAL HIGH (ref 0.70–1.25)
GFR, Est African American: 65 mL/min/{1.73_m2} (ref >=60)
GFR, Est Non African American: 56 mL/min/{1.73_m2} — ABNORMAL LOW (ref >=60)
Globulin: 2.7 g/dL (calc) (ref 1.9–3.7)
Glucose, Bld: 217 mg/dL — ABNORMAL HIGH (ref 65–99)
Potassium: 4.3 mmol/L (ref 3.5–5.3)
Sodium: 138 mmol/L (ref 135–146)
Total Bilirubin: 0.4 mg/dL (ref 0.2–1.2)
Total Protein: 6.8 g/dL (ref 6.1–8.1)

## 2019-08-04 LAB — HEMOGLOBIN A1C
Hgb A1c MFr Bld: 8.1 % of total Hgb — ABNORMAL HIGH (ref <5.7)
Mean Plasma Glucose: 186 (calc)
eAG (mmol/L): 10.3 (calc)

## 2019-09-14 ENCOUNTER — Ambulatory Visit (INDEPENDENT_AMBULATORY_CARE_PROVIDER_SITE_OTHER): Payer: Medicare Other | Admitting: Family Medicine

## 2019-09-14 ENCOUNTER — Encounter: Payer: Self-pay | Admitting: Family Medicine

## 2019-09-14 ENCOUNTER — Other Ambulatory Visit: Payer: Self-pay

## 2019-09-14 VITALS — BP 150/90 | HR 62 | Temp 98.7°F | Ht 69.0 in | Wt 207.0 lb

## 2019-09-14 DIAGNOSIS — I10 Essential (primary) hypertension: Secondary | ICD-10-CM | POA: Diagnosis not present

## 2019-09-14 DIAGNOSIS — E119 Type 2 diabetes mellitus without complications: Secondary | ICD-10-CM | POA: Diagnosis not present

## 2019-09-14 DIAGNOSIS — Z794 Long term (current) use of insulin: Secondary | ICD-10-CM

## 2019-09-14 DIAGNOSIS — I4891 Unspecified atrial fibrillation: Secondary | ICD-10-CM

## 2019-09-14 LAB — PT WITH INR/FINGERSTICK
INR, fingerstick: 2.4 ratio — ABNORMAL HIGH
PT, fingerstick: 29.4 s — ABNORMAL HIGH (ref 10.5–13.1)

## 2019-09-14 MED ORDER — LOSARTAN POTASSIUM 50 MG PO TABS
50.0000 mg | ORAL_TABLET | Freq: Every day | ORAL | 3 refills | Status: DC
Start: 2019-09-14 — End: 2020-09-22

## 2019-09-14 NOTE — Progress Notes (Signed)
Subjective:    Patient ID: Brian [name], male    DOB: [birth year], 64 y.o.   MRN: [id number]   08/03/19 Patient is a very pleasant 64 year old white male here today to recheck his INR.  INR is subtherapeutic at 1.8.  He is taking 10 mg a day of Coumadin and has not missed any doses.  His blood pressure is elevated today at 150/88 however he states that he has not taken his blood pressure medicine yet this morning.  He states that he is checking his blood pressure at home and is consistently 1 57-0 40 systolic.  He is not taking Trulicity and he is overdue to check his A1c.  Diabetic foot exam was performed today and shows normal pulses 2/4 at the dorsalis pedis and posterior tibialis however he has normal sensation to 10 g monofilament but does have long dystrophic toenails which are curling under his toes and growing into the plantar surfaces of the toes.  He states that he is able to cut them at home and the does not require a podiatry consult.  I strongly encouraged him to do this to avoid ulcer formation.  At that time, my plan was: INR is subtherapeutic.  I recommended that he take an extra 5 mg of Coumadin today and then resume 10 mg a day as he has been doing as he is state therapeutic for the last 6 months.  Recheck INR in 6 weeks.  Blood pressure is elevated.  I encouraged the patient to check his blood pressure every day and report the values to me.  If consistently greater than 177 systolic I would increase his losartan to 50 mg a day.  Check a hemoglobin A1c along with a CMP.  He appears to be retaining fluid so I recommended that he resume his Lasix which he has not been taking 20 mg a day.  I also strongly encouraged the patient to trim his toenails to avoid ulcer formation.  If he is unable to do this I will be glad to consult podiatrist however the patient states that he can accomplish this at home and declines a podiatry consult.  09/14/19 A1c was 8.1.  I recommended actos if he could  not afford trulicity.  However, patient is hesitant to take any pills.  Therefore he started taking his Trulicity again.  He is taking 0.75 mg weekly.  He is taking 70 units of Levemir in the morning and 40 units of Levemir in the evening.  He would like to try this for 3 months prior to making any additional changes.  His blood pressure remains elevated at 150/90.  He is taking losartan 25 mg a day. Past Surgical History:  Procedure Laterality Date  . Ear surgery as a child    . TONSILLECTOMY     Current Outpatient Medications on File Prior to Visit  Medication Sig Dispense Refill  . amLODipine (NORVASC) 10 MG tablet Take 1 tablet (10 mg total) by mouth daily. 90 tablet 1  . atorvastatin (LIPITOR) 20 MG tablet TAKE 1 TABLET (20 MG TOTAL) DAILY BY MOUTH. 90 tablet 3  . carvedilol (COREG) 25 MG tablet TAKE 1 TABLET BY MOUTH TWICE A DAY WITH A MEAL 180 tablet 3  . furosemide (LASIX) 20 MG tablet TAKE 1 TABLET (20 MG TOTAL) BY MOUTH DAILY AS NEEDED FOR FLUID. (Patient not taking: Reported on 08/03/2019) 90 tablet 1  . glucose blood (ONE TOUCH ULTRA TEST) test strip CHECK BLOOD SUGAR  FOUR TIMES DAILY (e11.9) 400 each 3  . hydrALAZINE (APRESOLINE) 50 MG tablet TAKE 1 TABLET BY MOUTH THREE TIMES A DAY 270 tablet 1  . insulin detemir (LEVEMIR FLEXTOUCH) 100 UNIT/ML FlexPen INJECT 70 UNITS UNDER THE SKIN EVERY MORNING AND 40 UNITS AT BEDTIME 15 mL 1  . Insulin Pen Needle (B-D UF III MINI PEN NEEDLES) 31G X 5 MM MISC USE DAILY WITH INSULIN 100 each 5  . Insulin Syringe-Needle U-100 (INSULIN SYRINGE .3CC/31GX5/16") 31G X 5/16" 0.3 ML MISC Use twice daily to inject insulin. 100 each 10  . losartan (COZAAR) 25 MG tablet Take 1 tablet (25 mg total) by mouth daily. 90 tablet 3  . tadalafil (CIALIS) 20 MG tablet Take 0.5-1 tablets (10-20 mg total) by mouth every other day as needed for erectile dysfunction. (Patient not taking: Reported on 08/03/2019) 5 tablet 11  . TRULICITY 7.90 WI/0.9BD SOPN INJECT 0.75 MG INTO  THE SKIN ONCE A WEEK. (Patient not taking: Reported on 08/03/2019) 4 pen 11  . warfarin (COUMADIN) 10 MG tablet Take 1 tablet (10 mg total) by mouth daily. 90 tablet 3   No current facility-administered medications on file prior to visit.   Allergies  Allergen Reactions  . Codeine Other (See Comments)    Unknown reaction, severe headach   Social History   Socioeconomic History  . Marital status: Divorced    Spouse name: Not on file  . Number of children: 2  . Years of education: Not on file  . Highest education level: Not on file  Occupational History  . Occupation: Disabled  Tobacco Use  . Smoking status: Smoker, Current Status Unknown  . Smokeless tobacco: Never Used  Substance and Sexual Activity  . Alcohol use: Yes    Alcohol/week: 0.0 standard drinks    Comment: seldom  . Drug use: Yes    Frequency: 7.0 times per week    Comment: marijauna for arthritis  . Sexual activity: Not on file  Other Topics Concern  . Not on file  Social History Narrative   He has children.   Social Determinants of Health   Financial Resource Strain:   . Difficulty of Paying Living Expenses:   Food Insecurity:   . Worried About Charity fundraiser in the Last Year:   . Arboriculturist in the Last Year:   Transportation Needs:   . Film/video editor (Medical):   Marland Kitchen Lack of Transportation (Non-Medical):   Physical Activity:   . Days of Exercise per Week:   . Minutes of Exercise per Session:   Stress:   . Feeling of Stress :   Social Connections:   . Frequency of Communication with Friends and Family:   . Frequency of Social Gatherings with Friends and Family:   . Attends Religious Services:   . Active Member of Clubs or Organizations:   . Attends Archivist Meetings:   Marland Kitchen Marital Status:   Intimate Partner Violence:   . Fear of Current or Ex-Partner:   . Emotionally Abused:   Marland Kitchen Physically Abused:   . Sexually Abused:     Review of Systems  All other systems  reviewed and are negative.      Objective:   Physical Exam Vitals reviewed.  Cardiovascular:     Rate and Rhythm: Normal rate and regular rhythm.     Heart sounds: Normal heart sounds.  Pulmonary:     Effort: Pulmonary effort is normal. No respiratory distress.  Breath sounds: Normal breath sounds. No wheezing or rales.  Chest:     Chest wall: No tenderness.  Abdominal:     General: Bowel sounds are normal. There is no distension.     Palpations: Abdomen is soft.     Tenderness: There is no abdominal tenderness. There is no rebound.  Musculoskeletal:     Right lower leg: No edema.     Left lower leg: No edema.           Assessment & Plan:  Type 2 diabetes mellitus without complication, with long-term current use of insulin (HCC)  Atrial fibrillation, unspecified type ([place]) - Plan: PT with INR/Fingerstick  Essential hypertension, benign  Blood pressure is elevated and therefore of asked the patient to increase losartan to 50 mg a day.  A1c was elevated at his last visit however he is now taking Trulicity weekly as he was prescribed in addition to his insulin.  I would like to recheck his INR in 3 months.  INR today is 2.4.  No changes in coumadin (10 mg poqday)  Recheck INR in 6 weeks.

## 2019-10-08 ENCOUNTER — Other Ambulatory Visit: Payer: Self-pay | Admitting: Family Medicine

## 2019-10-19 ENCOUNTER — Other Ambulatory Visit: Payer: Self-pay

## 2019-10-19 ENCOUNTER — Ambulatory Visit (INDEPENDENT_AMBULATORY_CARE_PROVIDER_SITE_OTHER): Payer: Medicare Other | Admitting: Family Medicine

## 2019-10-19 VITALS — BP 140/70 | HR 72 | Temp 96.2°F | Ht 69.0 in | Wt 207.0 lb

## 2019-10-19 DIAGNOSIS — I10 Essential (primary) hypertension: Secondary | ICD-10-CM

## 2019-10-19 DIAGNOSIS — I4891 Unspecified atrial fibrillation: Secondary | ICD-10-CM | POA: Diagnosis not present

## 2019-10-19 LAB — PT WITH INR/FINGERSTICK
INR, fingerstick: 2 ratio — ABNORMAL HIGH
PT, fingerstick: 23.7 s — ABNORMAL HIGH (ref 10.5–13.1)

## 2019-10-19 NOTE — Progress Notes (Signed)
Subjective:    Patient ID: Brian [name], male    DOB: 01/19/1956, 64 y.o.   MRN: [id number]   Please see his last office visit.  He is due to recheck his INR next month.  However increasing his losartan has helped his blood pressure.  Blood pressure today is well controlled.  He denies any chest pain shortness of breath or dyspnea on exertion.  He is here today primarily to check his INR.  INR is therapeutic at 2.0.  He denies any bleeding or bruising.  However the patient has still not had his Covid vaccination. Past Surgical History:  Procedure Laterality Date  . Ear surgery as a child    . TONSILLECTOMY     Current Outpatient Medications on File Prior to Visit  Medication Sig Dispense Refill  . amLODipine (NORVASC) 10 MG tablet Take 1 tablet (10 mg total) by mouth daily. 90 tablet 1  . atorvastatin (LIPITOR) 20 MG tablet TAKE 1 TABLET (20 MG TOTAL) DAILY BY MOUTH. 90 tablet 3  . carvedilol (COREG) 25 MG tablet TAKE 1 TABLET BY MOUTH TWICE A DAY WITH A MEAL 180 tablet 3  . furosemide (LASIX) 20 MG tablet TAKE 1 TABLET (20 MG TOTAL) BY MOUTH DAILY AS NEEDED FOR FLUID. 90 tablet 1  . glucose blood (ONE TOUCH ULTRA TEST) test strip CHECK BLOOD SUGAR FOUR TIMES DAILY (e11.9) 400 each 3  . hydrALAZINE (APRESOLINE) 50 MG tablet TAKE 1 TABLET BY MOUTH THREE TIMES A DAY 270 tablet 1  . Insulin Pen Needle (B-D UF III MINI PEN NEEDLES) 31G X 5 MM MISC USE DAILY WITH INSULIN 100 each 5  . Insulin Syringe-Needle U-100 (INSULIN SYRINGE .3CC/31GX5/16") 31G X 5/16" 0.3 ML MISC Use twice daily to inject insulin. 100 each 10  . LEVEMIR FLEXTOUCH 100 UNIT/ML FlexPen INJECT 70 UNITS UNDER THE SKIN EVERY MORNING AND 40 UNITS AT BEDTIME 15 mL 1  . losartan (COZAAR) 25 MG tablet Take 1 tablet (25 mg total) by mouth daily. 90 tablet 3  . losartan (COZAAR) 50 MG tablet Take 1 tablet (50 mg total) by mouth daily. 90 tablet 3  . tadalafil (CIALIS) 20 MG tablet Take 0.5-1 tablets (10-20 mg total) by mouth  every other day as needed for erectile dysfunction. 5 tablet 11  . TRULICITY 1.66 AY/3.0ZS SOPN INJECT 0.75 MG INTO THE SKIN ONCE A WEEK. 4 pen 11  . warfarin (COUMADIN) 10 MG tablet Take 1 tablet (10 mg total) by mouth daily. 90 tablet 3   No current facility-administered medications on file prior to visit.   Allergies  Allergen Reactions  . Codeine Other (See Comments)    Unknown reaction, severe headach   Social History   Socioeconomic History  . Marital status: Divorced    Spouse name: Not on file  . Number of children: 2  . Years of education: Not on file  . Highest education level: Not on file  Occupational History  . Occupation: Disabled  Tobacco Use  . Smoking status: Smoker, Current Status Unknown  . Smokeless tobacco: Never Used  Substance and Sexual Activity  . Alcohol use: Yes    Alcohol/week: 0.0 standard drinks    Comment: seldom  . Drug use: Yes    Frequency: 7.0 times per week    Comment: marijauna for arthritis  . Sexual activity: Not on file  Other Topics Concern  . Not on file  Social History Narrative   He has children.   Social  Determinants of Health   Financial Resource Strain:   . Difficulty of Paying Living Expenses: Not on file  Food Insecurity:   . Worried About Charity fundraiser in the Last Year: Not on file  . Ran Out of Food in the Last Year: Not on file  Transportation Needs:   . Lack of Transportation (Medical): Not on file  . Lack of Transportation (Non-Medical): Not on file  Physical Activity:   . Days of Exercise per Week: Not on file  . Minutes of Exercise per Session: Not on file  Stress:   . Feeling of Stress : Not on file  Social Connections:   . Frequency of Communication with Friends and Family: Not on file  . Frequency of Social Gatherings with Friends and Family: Not on file  . Attends Religious Services: Not on file  . Active Member of Clubs or Organizations: Not on file  . Attends Archivist Meetings: Not  on file  . Marital Status: Not on file  Intimate Partner Violence:   . Fear of Current or Ex-Partner: Not on file  . Emotionally Abused: Not on file  . Physically Abused: Not on file  . Sexually Abused: Not on file    Review of Systems  All other systems reviewed and are negative.      Objective:   Physical Exam Vitals reviewed.  Cardiovascular:     Rate and Rhythm: Normal rate and regular rhythm.     Heart sounds: Normal heart sounds.  Pulmonary:     Effort: Pulmonary effort is normal. No respiratory distress.     Breath sounds: Normal breath sounds. No wheezing or rales.  Chest:     Chest wall: No tenderness.  Abdominal:     General: Bowel sounds are normal. There is no distension.     Palpations: Abdomen is soft.     Tenderness: There is no abdominal tenderness. There is no rebound.  Musculoskeletal:     Right lower leg: No edema.     Left lower leg: No edema.           Assessment & Plan:  Atrial fibrillation, unspecified type ([place]) - Plan: PT with INR/Fingerstick  Essential hypertension, benign   INR is therapeutic.  Continue Coumadin 10 mg a day.  Blood pressure on Cozaar 50 mg a day is much better.  Continue this dose.  Recheck INR in 6 weeks and at that time I will check a fasting lipid panel and a hemoglobin A1c.  Spent 15 minutes today with the patient discussing the Covid vaccine and strongly recommended that he receive it as he would be at high risk for complications.  Patient declines the Covid vaccination despite my urging.

## 2019-11-30 ENCOUNTER — Other Ambulatory Visit: Payer: Self-pay

## 2019-11-30 ENCOUNTER — Ambulatory Visit (INDEPENDENT_AMBULATORY_CARE_PROVIDER_SITE_OTHER): Payer: Medicare Other | Admitting: Family Medicine

## 2019-11-30 VITALS — BP 140/90 | HR 79 | Temp 98.1°F | Ht 69.0 in | Wt 209.0 lb

## 2019-11-30 DIAGNOSIS — E782 Mixed hyperlipidemia: Secondary | ICD-10-CM | POA: Diagnosis not present

## 2019-11-30 DIAGNOSIS — I10 Essential (primary) hypertension: Secondary | ICD-10-CM

## 2019-11-30 DIAGNOSIS — I4891 Unspecified atrial fibrillation: Secondary | ICD-10-CM

## 2019-11-30 DIAGNOSIS — E1169 Type 2 diabetes mellitus with other specified complication: Secondary | ICD-10-CM | POA: Diagnosis not present

## 2019-11-30 DIAGNOSIS — E669 Obesity, unspecified: Secondary | ICD-10-CM

## 2019-11-30 DIAGNOSIS — Z794 Long term (current) use of insulin: Secondary | ICD-10-CM | POA: Diagnosis not present

## 2019-11-30 DIAGNOSIS — E119 Type 2 diabetes mellitus without complications: Secondary | ICD-10-CM | POA: Diagnosis not present

## 2019-11-30 LAB — PT WITH INR/FINGERSTICK
INR, fingerstick: 3 ratio — ABNORMAL HIGH
PT, fingerstick: 36.5 s — ABNORMAL HIGH (ref 10.5–13.1)

## 2019-11-30 NOTE — Progress Notes (Signed)
Subjective:    Patient ID: Brian [name], male    DOB: [birth year], 64 y.o.   MRN: [id number]   Patient is a very pleasant 64 year old Caucasian male here today to recheck his INR.  INR is therapeutic at 3.0.  He is currently on Coumadin for atrial fibrillation and stroke prevention.  However he is here today also to discuss management of his diabetes.  In June, his A1c was 8.1.  Due to a dysfunctional glucometer, I have been hesitant to increase his insulin out of the risk of hypoglycemia.  He is currently on Levemir 70 units in the morning and 40 units in the evening.  This is what the patient feels comfortable taking rather than taking 55 units twice a day.  At his last visit I tried to discuss adding Trulicity to better manage his sugars.  However the medication was too expensive and he stopped it as he could not afford it.  He is not checking his blood sugars although he denies any polyuria, polydipsia or blurry vision.  Blood pressure today is acceptable at 140/90.  He denies any chest pain shortness of breath or dyspnea on exertion.  He denies any hypoglycemic episodes.  He is due for a flu shot today but he declines at Past Surgical History:  Procedure Laterality Date  . Ear surgery as a child    . TONSILLECTOMY     Current Outpatient Medications on File Prior to Visit  Medication Sig Dispense Refill  . amLODipine (NORVASC) 10 MG tablet Take 1 tablet (10 mg total) by mouth daily. 90 tablet 1  . atorvastatin (LIPITOR) 20 MG tablet TAKE 1 TABLET (20 MG TOTAL) DAILY BY MOUTH. 90 tablet 3  . carvedilol (COREG) 25 MG tablet TAKE 1 TABLET BY MOUTH TWICE A DAY WITH A MEAL 180 tablet 3  . furosemide (LASIX) 20 MG tablet TAKE 1 TABLET (20 MG TOTAL) BY MOUTH DAILY AS NEEDED FOR FLUID. 90 tablet 1  . glucose blood (ONE TOUCH ULTRA TEST) test strip CHECK BLOOD SUGAR FOUR TIMES DAILY (e11.9) 400 each 3  . hydrALAZINE (APRESOLINE) 50 MG tablet TAKE 1 TABLET BY MOUTH THREE TIMES A DAY 270 tablet 1    . Insulin Pen Needle (B-D UF III MINI PEN NEEDLES) 31G X 5 MM MISC USE DAILY WITH INSULIN 100 each 5  . Insulin Syringe-Needle U-100 (INSULIN SYRINGE .3CC/31GX5/16") 31G X 5/16" 0.3 ML MISC Use twice daily to inject insulin. 100 each 10  . LEVEMIR FLEXTOUCH 100 UNIT/ML FlexPen INJECT 70 UNITS UNDER THE SKIN EVERY MORNING AND 40 UNITS AT BEDTIME 15 mL 1  . losartan (COZAAR) 25 MG tablet Take 1 tablet (25 mg total) by mouth daily. 90 tablet 3  . losartan (COZAAR) 50 MG tablet Take 1 tablet (50 mg total) by mouth daily. 90 tablet 3  . tadalafil (CIALIS) 20 MG tablet Take 0.5-1 tablets (10-20 mg total) by mouth every other day as needed for erectile dysfunction. 5 tablet 11  . TRULICITY 6.21 HY/8.6VH SOPN INJECT 0.75 MG INTO THE SKIN ONCE A WEEK. 4 pen 11  . warfarin (COUMADIN) 10 MG tablet Take 1 tablet (10 mg total) by mouth daily. 90 tablet 3   No current facility-administered medications on file prior to visit.   Allergies  Allergen Reactions  . Codeine Other (See Comments)    Unknown reaction, severe headach   Social History   Socioeconomic History  . Marital status: Divorced    Spouse name: Not on  file  . Number of children: 2  . Years of education: Not on file  . Highest education level: Not on file  Occupational History  . Occupation: Disabled  Tobacco Use  . Smoking status: Smoker, Current Status Unknown  . Smokeless tobacco: Never Used  Substance and Sexual Activity  . Alcohol use: Yes    Alcohol/week: 0.0 standard drinks    Comment: seldom  . Drug use: Yes    Frequency: 7.0 times per week    Comment: marijauna for arthritis  . Sexual activity: Not on file  Other Topics Concern  . Not on file  Social History Narrative   He has children.   Social Determinants of Health   Financial Resource Strain:   . Difficulty of Paying Living Expenses: Not on file  Food Insecurity:   . Worried About Charity fundraiser in the Last Year: Not on file  . Ran Out of Food in the  Last Year: Not on file  Transportation Needs:   . Lack of Transportation (Medical): Not on file  . Lack of Transportation (Non-Medical): Not on file  Physical Activity:   . Days of Exercise per Week: Not on file  . Minutes of Exercise per Session: Not on file  Stress:   . Feeling of Stress : Not on file  Social Connections:   . Frequency of Communication with Friends and Family: Not on file  . Frequency of Social Gatherings with Friends and Family: Not on file  . Attends Religious Services: Not on file  . Active Member of Clubs or Organizations: Not on file  . Attends Archivist Meetings: Not on file  . Marital Status: Not on file  Intimate Partner Violence:   . Fear of Current or Ex-Partner: Not on file  . Emotionally Abused: Not on file  . Physically Abused: Not on file  . Sexually Abused: Not on file    Review of Systems  All other systems reviewed and are negative.      Objective:   Physical Exam Vitals reviewed.  Cardiovascular:     Rate and Rhythm: Normal rate and regular rhythm.     Heart sounds: Normal heart sounds.  Pulmonary:     Effort: Pulmonary effort is normal. No respiratory distress.     Breath sounds: Normal breath sounds. No wheezing or rales.  Chest:     Chest wall: No tenderness.  Abdominal:     General: Bowel sounds are normal. There is no distension.     Palpations: Abdomen is soft.     Tenderness: There is no abdominal tenderness. There is no rebound.  Musculoskeletal:     Right lower leg: No edema.     Left lower leg: No edema.           Assessment & Plan:  Type 2 diabetes mellitus without complication, with long-term current use of insulin ([place]) - Plan: Hemoglobin A1c, CBC with Differential/Platelet, COMPLETE METABOLIC PANEL WITH GFR, Lipid panel  Atrial fibrillation, unspecified type ([place]) - Plan: PT with INR/Fingerstick  Blood pressure is elevated and therefore of asked the patient to increase losartan to 50 mg a day.  A1c  was elevated at his last visit however he is now taking Trulicity weekly as he was prescribed in addition to his insulin.  I would like to recheck his INR in 3 months.  INR today is 2.4.  No changes in coumadin (10 mg poqday)  Recheck INR in 6 weeks.

## 2019-12-01 ENCOUNTER — Other Ambulatory Visit: Payer: Self-pay

## 2019-12-01 DIAGNOSIS — E669 Obesity, unspecified: Secondary | ICD-10-CM

## 2019-12-01 DIAGNOSIS — E1169 Type 2 diabetes mellitus with other specified complication: Secondary | ICD-10-CM

## 2019-12-01 DIAGNOSIS — E119 Type 2 diabetes mellitus without complications: Secondary | ICD-10-CM

## 2019-12-01 LAB — COMPLETE METABOLIC PANEL WITH GFR
AG Ratio: 1.5 (calc) (ref 1.0–2.5)
ALT: 14 U/L (ref 9–46)
AST: 11 U/L (ref 10–35)
Albumin: 3.8 g/dL (ref 3.6–5.1)
Alkaline phosphatase (APISO): 65 U/L (ref 35–144)
BUN: 20 mg/dL (ref 7–25)
CO2: 26 mmol/L (ref 20–32)
Calcium: 8.9 mg/dL (ref 8.6–10.3)
Chloride: 104 mmol/L (ref 98–110)
Creat: 1.23 mg/dL (ref 0.70–1.25)
GFR, Est African American: 70 mL/min/{1.73_m2} (ref >=60)
GFR, Est Non African American: 61 mL/min/{1.73_m2} (ref >=60)
Globulin: 2.6 g/dL (calc) (ref 1.9–3.7)
Glucose, Bld: 196 mg/dL — ABNORMAL HIGH (ref 65–99)
Potassium: 4.3 mmol/L (ref 3.5–5.3)
Sodium: 138 mmol/L (ref 135–146)
Total Bilirubin: 0.5 mg/dL (ref 0.2–1.2)
Total Protein: 6.4 g/dL (ref 6.1–8.1)

## 2019-12-01 LAB — CBC WITH DIFFERENTIAL/PLATELET
Absolute Monocytes: 591 cells/uL (ref 200–950)
Basophils Absolute: 22 cells/uL (ref 0–200)
Basophils Relative: 0.3 %
Eosinophils Absolute: 88 cells/uL (ref 15–500)
Eosinophils Relative: 1.2 %
HCT: 43.4 % (ref 38.5–50.0)
Hemoglobin: 13.9 g/dL (ref 13.2–17.1)
Lymphs Abs: 1139 cells/uL (ref 850–3900)
MCH: 27.3 pg (ref 27.0–33.0)
MCHC: 32 g/dL (ref 32.0–36.0)
MCV: 85.1 fL (ref 80.0–100.0)
MPV: 9.6 fL (ref 7.5–12.5)
Monocytes Relative: 8.1 %
Neutro Abs: 5460 cells/uL (ref 1500–7800)
Neutrophils Relative %: 74.8 %
Platelets: 206 10*3/uL (ref 140–400)
RBC: 5.1 10*6/uL (ref 4.20–5.80)
RDW: 13.8 % (ref 11.0–15.0)
Total Lymphocyte: 15.6 %
WBC: 7.3 10*3/uL (ref 3.8–10.8)

## 2019-12-01 LAB — LIPID PANEL
Cholesterol: 135 mg/dL (ref <200)
HDL: 29 mg/dL — ABNORMAL LOW (ref >=40)
LDL Cholesterol (Calc): 79 mg/dL (calc)
Non-HDL Cholesterol (Calc): 106 mg/dL (calc) (ref <130)
Total CHOL/HDL Ratio: 4.7 (calc) (ref <5.0)
Triglycerides: 170 mg/dL — ABNORMAL HIGH (ref <150)

## 2019-12-01 LAB — HEMOGLOBIN A1C
Hgb A1c MFr Bld: 8 % of total Hgb — ABNORMAL HIGH (ref <5.7)
Mean Plasma Glucose: 183 (calc)
eAG (mmol/L): 10.1 (calc)

## 2019-12-01 MED ORDER — PIOGLITAZONE HCL 15 MG PO TABS: 15.0000 mg | ORAL_TABLET | Freq: Every day | ORAL | 2 refills | Status: DC

## 2020-01-04 ENCOUNTER — Other Ambulatory Visit: Payer: Self-pay | Admitting: Family Medicine

## 2020-01-18 ENCOUNTER — Ambulatory Visit (INDEPENDENT_AMBULATORY_CARE_PROVIDER_SITE_OTHER): Payer: Medicare Other | Admitting: Family Medicine

## 2020-01-18 ENCOUNTER — Other Ambulatory Visit: Payer: Self-pay

## 2020-01-18 VITALS — BP 150/90 | HR 73 | Temp 98.3°F | Ht 69.0 in | Wt 212.0 lb

## 2020-01-18 DIAGNOSIS — I4891 Unspecified atrial fibrillation: Secondary | ICD-10-CM | POA: Diagnosis not present

## 2020-01-18 DIAGNOSIS — R221 Localized swelling, mass and lump, neck: Secondary | ICD-10-CM | POA: Diagnosis not present

## 2020-01-18 LAB — PT WITH INR/FINGERSTICK
INR, fingerstick: 2.2 ratio — ABNORMAL HIGH
PT, fingerstick: 26.3 s — ABNORMAL HIGH (ref 10.5–13.1)

## 2020-01-18 MED ORDER — AMOXICILLIN-POT CLAVULANATE 875-125 MG PO TABS: 1.0000 | ORAL_TABLET | Freq: Two times a day (BID) | ORAL | 0 refills | Status: DC

## 2020-01-18 NOTE — Addendum Note (Signed)
Addended by: Saundra Shelling on: 01/18/2020 11:58 AM   Modules accepted: Orders

## 2020-01-18 NOTE — Progress Notes (Signed)
Subjective:    Patient ID: Brian [name], male    DOB: [birth year], 64 y.o.   MRN: [id number]   Patient presents today with a rapidly enlarging mass on the left side of his face just below the angle of the mandible.  He states that has been growing rapidly over the last week.  He reports that it is tender to palpation.  He has no tenderness to palpation in the left jaw or along the gumline although he does have poor dentition in this area.  He has no pain or tenderness in his left cheekbone.  The mass seems to be in the parotid gland.  At 7 cm x 5 cm and it is firm to the touch    Past Surgical History:  Procedure Laterality Date  . Ear surgery as a child    . TONSILLECTOMY     Current Outpatient Medications on File Prior to Visit  Medication Sig Dispense Refill  . amLODipine (NORVASC) 10 MG tablet Take 1 tablet (10 mg total) by mouth daily. 90 tablet 1  . atorvastatin (LIPITOR) 20 MG tablet TAKE 1 TABLET (20 MG TOTAL) DAILY BY MOUTH. 90 tablet 3  . carvedilol (COREG) 25 MG tablet TAKE 1 TABLET BY MOUTH TWICE A DAY WITH A MEAL 180 tablet 3  . furosemide (LASIX) 20 MG tablet TAKE 1 TABLET (20 MG TOTAL) BY MOUTH DAILY AS NEEDED FOR FLUID. 90 tablet 1  . glucose blood (ONE TOUCH ULTRA TEST) test strip CHECK BLOOD SUGAR FOUR TIMES DAILY (e11.9) 400 each 3  . hydrALAZINE (APRESOLINE) 50 MG tablet TAKE 1 TABLET BY MOUTH THREE TIMES A DAY 270 tablet 1  . Insulin Pen Needle (B-D UF III MINI PEN NEEDLES) 31G X 5 MM MISC USE DAILY WITH INSULIN 100 each 5  . Insulin Syringe-Needle U-100 (INSULIN SYRINGE .3CC/31GX5/16") 31G X 5/16" 0.3 ML MISC Use twice daily to inject insulin. 100 each 10  . LEVEMIR FLEXTOUCH 100 UNIT/ML FlexPen INJECT 70 UNITS UNDER THE SKIN EVERY MORNING AND 40 UNITS AT BEDTIME 15 mL 1  . losartan (COZAAR) 25 MG tablet Take 1 tablet (25 mg total) by mouth daily. 90 tablet 3  . losartan (COZAAR) 50 MG tablet Take 1 tablet (50 mg total) by mouth daily. 90 tablet 3  .  pioglitazone (ACTOS) 15 MG tablet Take 1 tablet (15 mg total) by mouth daily. 90 tablet 2  . tadalafil (CIALIS) 20 MG tablet Take 0.5-1 tablets (10-20 mg total) by mouth every other day as needed for erectile dysfunction. 5 tablet 11  . TRULICITY 5.57 DU/2.0UR SOPN INJECT 0.75 MG INTO THE SKIN ONCE A WEEK. 4 pen 11  . warfarin (COUMADIN) 10 MG tablet Take 1 tablet (10 mg total) by mouth daily. 90 tablet 3   No current facility-administered medications on file prior to visit.   Allergies  Allergen Reactions  . Codeine Other (See Comments)    Unknown reaction, severe headach   Social History   Socioeconomic History  . Marital status: Divorced    Spouse name: Not on file  . Number of children: 2  . Years of education: Not on file  . Highest education level: Not on file  Occupational History  . Occupation: Disabled  Tobacco Use  . Smoking status: Smoker, Current Status Unknown  . Smokeless tobacco: Never Used  Substance and Sexual Activity  . Alcohol use: Yes    Alcohol/week: 0.0 standard drinks    Comment: seldom  . Drug use:  Yes    Frequency: 7.0 times per week    Comment: marijauna for arthritis  . Sexual activity: Not on file  Other Topics Concern  . Not on file  Social History Narrative   He has children.   Social Determinants of Health   Financial Resource Strain:   . Difficulty of Paying Living Expenses: Not on file  Food Insecurity:   . Worried About Charity fundraiser in the Last Year: Not on file  . Ran Out of Food in the Last Year: Not on file  Transportation Needs:   . Lack of Transportation (Medical): Not on file  . Lack of Transportation (Non-Medical): Not on file  Physical Activity:   . Days of Exercise per Week: Not on file  . Minutes of Exercise per Session: Not on file  Stress:   . Feeling of Stress : Not on file  Social Connections:   . Frequency of Communication with Friends and Family: Not on file  . Frequency of Social Gatherings with Friends  and Family: Not on file  . Attends Religious Services: Not on file  . Active Member of Clubs or Organizations: Not on file  . Attends Archivist Meetings: Not on file  . Marital Status: Not on file  Intimate Partner Violence:   . Fear of Current or Ex-Partner: Not on file  . Emotionally Abused: Not on file  . Physically Abused: Not on file  . Sexually Abused: Not on file    Review of Systems  All other systems reviewed and are negative.      Objective:   Physical Exam Vitals reviewed.  HENT:     Head:     Salivary Glands: Left salivary gland is diffusely enlarged and tender.   Cardiovascular:     Rate and Rhythm: Normal rate and regular rhythm.     Heart sounds: Normal heart sounds.  Pulmonary:     Effort: Pulmonary effort is normal. No respiratory distress.     Breath sounds: Normal breath sounds. No wheezing or rales.  Chest:     Chest wall: No tenderness.  Abdominal:     General: Bowel sounds are normal. There is no distension.     Palpations: Abdomen is soft.     Tenderness: There is no abdominal tenderness. There is no rebound.  Musculoskeletal:     Right lower leg: No edema.     Left lower leg: No edema.           Assessment & Plan:  Neck mass - Plan: CT Soft Tissue Neck W Contrast  Differential diagnosis includes parotitis, salivary duct stone, or neoplasm within the parotid gland.  I will treat parotitis with Augmentin 875 mg twice daily for 10 days.  Recheck INR next week.  Is therapeutic today at 2.2.  Recheck swelling next week.  Meantime obtain CT scan of the neck to rule out parotid gland mass.

## 2020-01-27 ENCOUNTER — Other Ambulatory Visit: Payer: Self-pay | Admitting: Family Medicine

## 2020-01-28 ENCOUNTER — Other Ambulatory Visit: Payer: Self-pay | Admitting: Family Medicine

## 2020-01-28 ENCOUNTER — Telehealth: Payer: Self-pay | Admitting: *Deleted

## 2020-01-28 ENCOUNTER — Other Ambulatory Visit: Payer: Self-pay

## 2020-01-28 ENCOUNTER — Ambulatory Visit
Admission: RE | Admit: 2020-01-28 | Discharge: 2020-01-28 | Disposition: A | Discharge status: Home / Self Care | Payer: Medicare HMO | Source: Ambulatory Visit | Attending: Family Medicine | Admitting: Family Medicine

## 2020-01-28 ENCOUNTER — Ambulatory Visit (INDEPENDENT_AMBULATORY_CARE_PROVIDER_SITE_OTHER): Payer: Medicare Other | Admitting: Family Medicine

## 2020-01-28 VITALS — BP 150/100 | HR 78 | Temp 97.5°F | Ht 69.0 in | Wt 211.0 lb

## 2020-01-28 DIAGNOSIS — K118 Other diseases of salivary glands: Secondary | ICD-10-CM | POA: Diagnosis not present

## 2020-01-28 DIAGNOSIS — R221 Localized swelling, mass and lump, neck: Secondary | ICD-10-CM

## 2020-01-28 DIAGNOSIS — C099 Malignant neoplasm of tonsil, unspecified: Secondary | ICD-10-CM | POA: Diagnosis not present

## 2020-01-28 DIAGNOSIS — G9520 Unspecified cord compression: Secondary | ICD-10-CM | POA: Diagnosis not present

## 2020-01-28 DIAGNOSIS — I82C13 Acute embolism and thrombosis of internal jugular vein, bilateral: Secondary | ICD-10-CM | POA: Diagnosis not present

## 2020-01-28 MED ORDER — IOPAMIDOL (ISOVUE-300) INJECTION 61%
75.0000 mL | Freq: Once | INTRAVENOUS | Status: AC | PRN
  Administered 2020-01-28: 75 mL via INTRAVENOUS

## 2020-01-28 NOTE — Progress Notes (Signed)
Subjective:    Patient ID: Brian [name], male    DOB: 06-03-1955, 64 y.o.   MRN: [id number]  01/18/20 Patient presents today with a rapidly enlarging mass on the left side of his face just below the angle of the mandible.  He states that has been growing rapidly over the last week.  He reports that it is tender to palpation.  He has no tenderness to palpation in the left jaw or along the gumline although he does have poor dentition in this area.  He has no pain or tenderness in his left cheekbone.  The mass seems to be in the parotid gland.  At 7 cm x 5 cm and it is firm to the touch   At that time, my plan was: Differential diagnosis includes parotitis, salivary duct stone, or neoplasm within the parotid gland.  I will treat parotitis with Augmentin 875 mg twice daily for 10 days.  Recheck INR next week.  Is therapeutic today at 2.2.  Recheck swelling next week.  Meantime obtain CT scan of the neck to rule out parotid gland mass.  01/28/20 Situation has worsened.  The mass has enlarged over the angle of the left mandible.  It is firm and hard.  It is extending down his neck now.  Also there is now a firm mass developing over the angle of the mandible on the right side extending down the right lateral neck as diagrammed.  It is starting to affect his voice.  Given the sudden onset of the swelling and the bilateral nature, I am now very concerned about lymphadenopathy and obviously lymphoma.  Past Surgical History:  Procedure Laterality Date  . Ear surgery as a child    . TONSILLECTOMY     Current Outpatient Medications on File Prior to Visit  Medication Sig Dispense Refill  . amLODipine (NORVASC) 10 MG tablet Take 1 tablet (10 mg total) by mouth daily. 90 tablet 1  . atorvastatin (LIPITOR) 20 MG tablet TAKE 1 TABLET (20 MG TOTAL) DAILY BY MOUTH. 90 tablet 3  . carvedilol (COREG) 25 MG tablet TAKE 1 TABLET BY MOUTH TWICE A DAY WITH A MEAL 180 tablet 3  . furosemide (LASIX) 20 MG tablet  TAKE 1 TABLET (20 MG TOTAL) BY MOUTH DAILY AS NEEDED FOR FLUID. 90 tablet 1  . glucose blood (ONE TOUCH ULTRA TEST) test strip CHECK BLOOD SUGAR FOUR TIMES DAILY (e11.9) 400 each 3  . hydrALAZINE (APRESOLINE) 50 MG tablet TAKE 1 TABLET BY MOUTH THREE TIMES A DAY 270 tablet 1  . Insulin Pen Needle (B-D UF III MINI PEN NEEDLES) 31G X 5 MM MISC USE DAILY WITH INSULIN 100 each 5  . Insulin Syringe-Needle U-100 (INSULIN SYRINGE .3CC/31GX5/16") 31G X 5/16" 0.3 ML MISC Use twice daily to inject insulin. 100 each 10  . LEVEMIR FLEXTOUCH 100 UNIT/ML FlexPen INJECT 70 UNITS UNDER THE SKIN EVERY MORNING AND 40 UNITS AT BEDTIME 15 mL 1  . losartan (COZAAR) 25 MG tablet Take 1 tablet (25 mg total) by mouth daily. 90 tablet 3  . losartan (COZAAR) 50 MG tablet Take 1 tablet (50 mg total) by mouth daily. 90 tablet 3  . pioglitazone (ACTOS) 15 MG tablet Take 1 tablet (15 mg total) by mouth daily. 90 tablet 2  . tadalafil (CIALIS) 20 MG tablet Take 0.5-1 tablets (10-20 mg total) by mouth every other day as needed for erectile dysfunction. 5 tablet 11  . TRULICITY 3.78 HY/8.5OY SOPN INJECT 0.75 MG  INTO THE SKIN ONCE A WEEK. 4 pen 11  . warfarin (COUMADIN) 10 MG tablet Take 1 tablet (10 mg total) by mouth daily. 90 tablet 3   No current facility-administered medications on file prior to visit.   Allergies  Allergen Reactions  . Codeine Other (See Comments)    Unknown reaction, severe headach   Social History   Socioeconomic History  . Marital status: Divorced    Spouse name: Not on file  . Number of children: 2  . Years of education: Not on file  . Highest education level: Not on file  Occupational History  . Occupation: Disabled  Tobacco Use  . Smoking status: Smoker, Current Status Unknown  . Smokeless tobacco: Never Used  Substance and Sexual Activity  . Alcohol use: Yes    Alcohol/week: 0.0 standard drinks    Comment: seldom  . Drug use: Yes    Frequency: 7.0 times per week    Comment:  marijauna for arthritis  . Sexual activity: Not on file  Other Topics Concern  . Not on file  Social History Narrative   He has children.   Social Determinants of Health   Financial Resource Strain: Not on file  Food Insecurity: Not on file  Transportation Needs: Not on file  Physical Activity: Not on file  Stress: Not on file  Social Connections: Not on file  Intimate Partner Violence: Not on file    Review of Systems  All other systems reviewed and are negative.      Objective:   Physical Exam Vitals reviewed.  HENT:     Head:     Salivary Glands: Left salivary gland is diffusely enlarged and tender.   Neck:   Cardiovascular:     Rate and Rhythm: Normal rate and regular rhythm.     Heart sounds: Normal heart sounds.  Pulmonary:     Effort: Pulmonary effort is normal. No respiratory distress.     Breath sounds: Normal breath sounds. No wheezing or rales.  Chest:     Chest wall: No tenderness.  Abdominal:     General: Bowel sounds are normal. There is no distension.     Palpations: Abdomen is soft.     Tenderness: There is no abdominal tenderness. There is no rebound.  Musculoskeletal:     Cervical back: Muscular tenderness present.     Right lower leg: No edema.     Left lower leg: No edema.  Lymphadenopathy:     Cervical: Cervical adenopathy present.     Right cervical: Superficial cervical adenopathy present.     Left cervical: Superficial cervical adenopathy present.           Assessment & Plan:  Neck mass  Given the gradual worsening in the bilateral nature, I believe this is most likely lymphadenopathy and I am very concerned about lymphoma.  I will expedite the patient CT scan to today at 2:00 and based on the results, likely schedule the patient for a biopsy of the lymph nodes.  If he does have lymphoma, he will need urgent consultation with oncology.  Therefore I had the patient stop his Coumadin today as he will likely need biopsy as soon as  possible.  We may need to even consider vitamin K reversal in order to expedite the biopsy based on the CT scan findings.

## 2020-01-28 NOTE — Telephone Encounter (Signed)
Call placed to [facility] to have CT of neck re-scheduled.   Appointment scheduled for 01/28/2020 @ 2:40pm. Advised that patient should fast x4 hours prior to imaging, but he can have fluids.   Call placed to patient and patient made aware.

## 2020-01-29 ENCOUNTER — Other Ambulatory Visit: Payer: Self-pay | Admitting: Family Medicine

## 2020-01-29 DIAGNOSIS — C109 Malignant neoplasm of oropharynx, unspecified: Secondary | ICD-10-CM

## 2020-02-01 ENCOUNTER — Telehealth: Payer: Self-pay

## 2020-02-01 DIAGNOSIS — D487 Neoplasm of uncertain behavior of other specified sites: Secondary | ICD-10-CM | POA: Diagnosis not present

## 2020-02-01 DIAGNOSIS — C140 Malignant neoplasm of pharynx, unspecified: Secondary | ICD-10-CM | POA: Diagnosis not present

## 2020-02-01 NOTE — Telephone Encounter (Signed)
Call made to [facility], spoke with Beverlee Nims, for Call report on mutual patient, Provider has received report.

## 2020-02-02 ENCOUNTER — Other Ambulatory Visit: Payer: Self-pay | Admitting: Otolaryngology

## 2020-02-04 ENCOUNTER — Other Ambulatory Visit: Payer: Self-pay | Admitting: Otolaryngology

## 2020-02-08 ENCOUNTER — Other Ambulatory Visit (HOSPITAL_COMMUNITY)
Admission: RE | Admit: 2020-02-08 | Discharge: 2020-02-08 | Disposition: A | Discharge status: Home / Self Care | Payer: Medicare Other | Source: Ambulatory Visit | Attending: Otolaryngology | Admitting: Otolaryngology

## 2020-02-08 ENCOUNTER — Other Ambulatory Visit: Payer: Medicare HMO

## 2020-02-08 ENCOUNTER — Encounter (HOSPITAL_COMMUNITY)
Admission: RE | Admit: 2020-02-08 | Discharge: 2020-02-08 | Disposition: A | Discharge status: Home / Self Care | Payer: Medicare Other | Source: Ambulatory Visit | Attending: Otolaryngology | Admitting: Otolaryngology

## 2020-02-08 ENCOUNTER — Encounter (HOSPITAL_COMMUNITY): Payer: Self-pay

## 2020-02-08 ENCOUNTER — Other Ambulatory Visit: Payer: Self-pay

## 2020-02-08 DIAGNOSIS — Z20822 Contact with and (suspected) exposure to covid-19: Secondary | ICD-10-CM | POA: Insufficient documentation

## 2020-02-08 DIAGNOSIS — Z01812 Encounter for preprocedural laboratory examination: Secondary | ICD-10-CM | POA: Insufficient documentation

## 2020-02-08 LAB — CBC
HCT: 39.2 % (ref 39.0–52.0)
Hemoglobin: 12.7 g/dL — ABNORMAL LOW (ref 13.0–17.0)
MCH: 28.1 pg (ref 26.0–34.0)
MCHC: 32.4 g/dL (ref 30.0–36.0)
MCV: 86.7 fL (ref 80.0–100.0)
Platelets: 220 10*3/uL (ref 150–400)
RBC: 4.52 MIL/uL (ref 4.22–5.81)
RDW: 14.2 % (ref 11.5–15.5)
WBC: 6.7 10*3/uL (ref 4.0–10.5)
nRBC: 0 % (ref 0.0–0.2)

## 2020-02-08 LAB — BASIC METABOLIC PANEL
Anion gap: 11 (ref 5–15)
BUN: 19 mg/dL (ref 8–23)
CO2: 24 mmol/L (ref 22–32)
Calcium: 9 mg/dL (ref 8.9–10.3)
Chloride: 101 mmol/L (ref 98–111)
Creatinine, Ser: 1.26 mg/dL — ABNORMAL HIGH (ref 0.61–1.24)
GFR, Estimated: >60 mL/min (ref >60)
Glucose, Bld: 95 mg/dL (ref 70–99)
Potassium: 3.9 mmol/L (ref 3.5–5.1)
Sodium: 136 mmol/L (ref 135–145)

## 2020-02-08 LAB — SARS CORONAVIRUS 2 (TAT 6-24 HRS): SARS Coronavirus 2: NEGATIVE

## 2020-02-08 LAB — PROTIME-INR
INR: 1 (ref 0.8–1.2)
Prothrombin Time: 13.1 seconds (ref 11.4–15.2)

## 2020-02-08 LAB — GLUCOSE, CAPILLARY: Glucose-Capillary: 89 mg/dL (ref 70–99)

## 2020-02-08 NOTE — Progress Notes (Signed)
PCP:  DR. Jenna Luo Cardiologist:  Denies  EKG:  02/08/20 CXR:  N/A ECHO:  Denies Stress Test:  denies Cardiac Cath:  Denies  Fasting Blood Sugar-  70's to 150's Checks Blood Sugar_0-1__ times a day  OSA:  Yes CPAP:  No  ASA:  No Blood Thinners:  Yes, last dose Coumadin approximately 2 weeks ago   Covid test 02/08/20.  Anesthesia Review:  No  Patient denies shortness of breath, fever, cough, and chest pain at PAT appointment.  Patient verbalized understanding of instructions provided today at the PAT appointment.  Patient asked to review instructions at home and day of surgery.

## 2020-02-08 NOTE — Progress Notes (Signed)
[id number] [place], [name] - [address] [name] [address] Phone: [phone] Fax: [phone]      Your procedure is scheduled on 02/10/20.  Report to [facility] Main Entrance "A" at 7:15 A.M., and check in at the Admitting office.  Call this number if you have problems the morning of surgery:  [phone]  Call [phone] if you have any questions prior to your surgery date Monday-Friday 8am-4pm    Remember:  Do not eat or drink after midnight the night before your surgery    Take these medicines the morning of surgery with A SIP OF WATER: atorvastatin (LIPITOR) carvedilol (COREG)  hydrALAZINE (APRESOLINE)   Follow your surgeon's instructions on when to stop Coumadin.  If no instructions were given by your surgeon then you will need to call the office to get those instructions.    As of today, STOP taking any Aspirin (unless otherwise instructed by your surgeon) Aleve, Naproxen, Ibuprofen, Motrin, Advil, Goody's, BC's, all herbal medications, fish oil, and all vitamins.  WHAT DO I DO ABOUT MY DIABETES MEDICATION?   Marland Kitchen Do not take oral diabetes medicines (pills) the morning of surgery.  . THE NIGHT BEFORE SURGERY, take _50% or 20_ units of__Levimer__insulin.       . THE MORNING OF SURGERY, take _50% or 35 units of __Levemir_insulin.  . The day of surgery, do not take other diabetes injectables, including Byetta (exenatide), Bydureon (exenatide ER), Victoza (liraglutide), or Trulicity (dulaglutide).   HOW TO MANAGE YOUR DIABETES BEFORE AND AFTER SURGERY  Why is it important to control my blood sugar before and after surgery? . Improving blood sugar levels before and after surgery helps healing and can limit problems. . A way of improving blood sugar control is eating a healthy diet by: o  Eating less sugar and carbohydrates o  Increasing activity/exercise o  Talking with your doctor about  reaching your blood sugar goals . High blood sugars (greater than 180 mg/dL) can raise your risk of infections and slow your recovery, so you will need to focus on controlling your diabetes during the weeks before surgery. . Make sure that the doctor who takes care of your diabetes knows about your planned surgery including the date and location.  How do I manage my blood sugar before surgery? . Check your blood sugar at least 4 times a day, starting 2 days before surgery, to make sure that the level is not too high or low. . Check your blood sugar the morning of your surgery when you wake up and every 2 hours until you get to the Short Stay unit. o If your blood sugar is less than 70 mg/dL, you will need to treat for low blood sugar: - Do not take insulin. - Treat a low blood sugar (less than 70 mg/dL) with  cup of clear juice (cranberry or apple), 4 glucose tablets, OR glucose gel. - Recheck blood sugar in 15 minutes after treatment (to make sure it is greater than 70 mg/dL). If your blood sugar is not greater than 70 mg/dL on recheck, call [phone] for further instructions. . Report your blood sugar to the short stay nurse when you get to Short Stay.  . If you are admitted to the hospital after surgery: o Your blood sugar will be checked by the staff and you will probably be given insulin after surgery (instead of oral diabetes medicines) to make sure you have good  blood sugar levels. o The goal for blood sugar control after surgery is 80-180 mg/dL.                  Do not wear jewelry, make up, or nail polish            Do not wear lotions, powders, colognes, or deodorant.            Do not shave 48 hours prior to surgery.  Men may shave face and neck.            Do not bring valuables to the hospital.            [facility] is not responsible for any belongings or valuables.  Do NOT Smoke (Tobacco/Vaping) or drink Alcohol 24 hours prior to your procedure If you use a CPAP at night,  you may bring all equipment for your overnight stay.   Contacts, glasses, dentures or bridgework may not be worn into surgery.      For patients admitted to the hospital, discharge time will be determined by your treatment team.   Patients discharged the day of surgery will not be allowed to drive home, and someone needs to stay with them for 24 hours.    Special instructions:   [facility]- Preparing For Surgery  Before surgery, you can play an important role. Because skin is not sterile, your skin needs to be as free of germs as possible. You can reduce the number of germs on your skin by washing with CHG (chlorahexidine gluconate) Soap before surgery.  CHG is an antiseptic cleaner which kills germs and bonds with the skin to continue killing germs even after washing.    Oral Hygiene is also important to reduce your risk of infection.  Remember - BRUSH YOUR TEETH THE MORNING OF SURGERY WITH YOUR REGULAR TOOTHPASTE  Please do not use if you have an allergy to CHG or antibacterial soaps. If your skin becomes reddened/irritated stop using the CHG.  Do not shave (including legs and underarms) for at least 48 hours prior to first CHG shower. It is OK to shave your face.  Please follow these instructions carefully.   1. Shower the NIGHT BEFORE SURGERY and the MORNING OF SURGERY with CHG Soap.   2. If you chose to wash your hair, wash your hair first as usual with your normal shampoo.  3. After you shampoo, rinse your hair and body thoroughly to remove the shampoo.  4. Use CHG as you would any other liquid soap. You can apply CHG directly to the skin and wash gently with a scrungie or a clean washcloth.   5. Apply the CHG Soap to your body ONLY FROM THE NECK DOWN.  Do not use on open wounds or open sores. Avoid contact with your eyes, ears, mouth and genitals (private parts). Wash Face and genitals (private parts)  with your normal soap.   6. Wash thoroughly, paying special attention to  the area where your surgery will be performed.  7. Thoroughly rinse your body with warm water from the neck down.  8. DO NOT shower/wash with your normal soap after using and rinsing off the CHG Soap.  9. Pat yourself dry with a CLEAN TOWEL.  10. Wear CLEAN PAJAMAS to bed the night before surgery  11. Place CLEAN SHEETS on your bed the night of your first shower and DO NOT SLEEP WITH PETS.   Day of Surgery: Wear Clean/Comfortable clothing the morning of  surgery Do not apply any deodorants/lotions.   Remember to brush your teeth WITH YOUR REGULAR TOOTHPASTE.   Please read over the following fact sheets that you were given.

## 2020-02-09 NOTE — Anesthesia Preprocedure Evaluation (Addendum)
Anesthesia Evaluation  Patient identified by MRN, date of birth, ID band Patient awake    Reviewed: Allergy & Precautions, NPO status , Patient's Chart, lab work & pertinent test results  Airway Mallampati: III  TM Distance: >3 FB Neck ROM: Full    Dental no notable dental hx. (+) Poor Dentition, Missing,    Pulmonary neg pulmonary ROS,    Pulmonary exam normal breath sounds clear to auscultation       Cardiovascular hypertension, Pt. on medications Normal cardiovascular examAtrial Fibrillation  Rhythm:Regular Rate:Normal     Neuro/Psych l sided weakness CVA, Residual Symptoms negative psych ROS   GI/Hepatic negative GI ROS, Neg liver ROS,   Endo/Other  diabetes, Well Controlled, Type 2, Oral Hypoglycemic Agents  Renal/GU negative Renal ROSK+ 3.9 Cr 1.26  negative genitourinary   Musculoskeletal  (+) Arthritis ,   Abdominal (+) + obese,   Peds  Hematology negative hematology ROS (+)   Anesthesia Other Findings ALL: codeine  Reproductive/Obstetrics                          Anesthesia Physical Anesthesia Plan  ASA: III  Anesthesia Plan: General   Post-op Pain Management:    Induction: Intravenous  PONV Risk Score and Plan: 3 and Treatment may vary due to age or medical condition, Ondansetron and Midazolam  Airway Management Planned: Video Laryngoscope Planned  Additional Equipment: None  Intra-op Plan:   Post-operative Plan: Extubation in OR  Informed Consent: I have reviewed the patients History and Physical, chart, labs and discussed the procedure including the risks, benefits and alternatives for the proposed anesthesia with the patient or authorized representative who has indicated his/her understanding and acceptance.     Dental advisory given  Plan Discussed with: CRNA and Anesthesiologist  Anesthesia Plan Comments:        Anesthesia Quick Evaluation

## 2020-02-10 ENCOUNTER — Ambulatory Visit (HOSPITAL_COMMUNITY): Payer: Medicare Other | Admitting: Certified Registered Nurse Anesthetist

## 2020-02-10 ENCOUNTER — Other Ambulatory Visit: Payer: Self-pay

## 2020-02-10 ENCOUNTER — Ambulatory Visit (HOSPITAL_COMMUNITY)
Admission: RE | Admit: 2020-02-10 | Discharge: 2020-02-10 | Disposition: A | Discharge status: Home / Self Care | Payer: Medicare Other | Attending: Otolaryngology | Admitting: Otolaryngology

## 2020-02-10 ENCOUNTER — Encounter (HOSPITAL_COMMUNITY)
Admission: RE | Disposition: A | Discharge status: Home / Self Care | Payer: Self-pay | Source: Home / Self Care | Attending: Otolaryngology

## 2020-02-10 ENCOUNTER — Encounter (HOSPITAL_COMMUNITY): Payer: Self-pay | Admitting: Otolaryngology

## 2020-02-10 DIAGNOSIS — E785 Hyperlipidemia, unspecified: Secondary | ICD-10-CM | POA: Diagnosis not present

## 2020-02-10 DIAGNOSIS — J359 Chronic disease of tonsils and adenoids, unspecified: Secondary | ICD-10-CM | POA: Diagnosis present

## 2020-02-10 DIAGNOSIS — C140 Malignant neoplasm of pharynx, unspecified: Secondary | ICD-10-CM | POA: Diagnosis not present

## 2020-02-10 DIAGNOSIS — C109 Malignant neoplasm of oropharynx, unspecified: Secondary | ICD-10-CM | POA: Diagnosis not present

## 2020-02-10 DIAGNOSIS — C099 Malignant neoplasm of tonsil, unspecified: Secondary | ICD-10-CM | POA: Insufficient documentation

## 2020-02-10 DIAGNOSIS — I4891 Unspecified atrial fibrillation: Secondary | ICD-10-CM | POA: Diagnosis not present

## 2020-02-10 HISTORY — PX: DIRECT LARYNGOSCOPY: SHX5326

## 2020-02-10 LAB — GLUCOSE, CAPILLARY
Glucose-Capillary: 78 mg/dL (ref 70–99)
Glucose-Capillary: 56 mg/dL — ABNORMAL LOW (ref 70–99)
Glucose-Capillary: 61 mg/dL — ABNORMAL LOW (ref 70–99)
Glucose-Capillary: 90 mg/dL (ref 70–99)

## 2020-02-10 SURGERY — LARYNGOSCOPY, DIRECT
Anesthesia: General | Site: Throat

## 2020-02-10 MED ORDER — ACETAMINOPHEN 10 MG/ML IV SOLN
INTRAVENOUS | Status: AC
  Filled 2020-02-10: qty 100

## 2020-02-10 MED ORDER — PROPOFOL 10 MG/ML IV BOLUS
INTRAVENOUS | Status: AC
  Filled 2020-02-10: qty 40

## 2020-02-10 MED ORDER — CHLORHEXIDINE GLUCONATE 0.12 % MT SOLN
15.0000 mL | Freq: Once | OROMUCOSAL | Status: AC
  Administered 2020-02-10: 15 mL via OROMUCOSAL
  Filled 2020-02-10: qty 15

## 2020-02-10 MED ORDER — ACETAMINOPHEN 10 MG/ML IV SOLN
1000.0000 mg | Freq: Once | INTRAVENOUS | Status: DC | PRN
  Administered 2020-02-10: 1000 mg via INTRAVENOUS

## 2020-02-10 MED ORDER — 0.9 % SODIUM CHLORIDE (POUR BTL) OPTIME
TOPICAL | Status: DC | PRN
  Administered 2020-02-10: 1000 mL

## 2020-02-10 MED ORDER — ONDANSETRON HCL 4 MG/2ML IJ SOLN: 4.0000 mg | Freq: Once | INTRAMUSCULAR | Status: DC | PRN

## 2020-02-10 MED ORDER — MEPERIDINE HCL 25 MG/ML IJ SOLN: 6.2500 mg | INTRAMUSCULAR | Status: DC | PRN

## 2020-02-10 MED ORDER — AMISULPRIDE (ANTIEMETIC) 5 MG/2ML IV SOLN: 10.0000 mg | Freq: Once | INTRAVENOUS | Status: DC | PRN

## 2020-02-10 MED ORDER — SUCCINYLCHOLINE CHLORIDE 200 MG/10ML IV SOSY
PREFILLED_SYRINGE | INTRAVENOUS | Status: DC | PRN
  Administered 2020-02-10: 100 mg via INTRAVENOUS

## 2020-02-10 MED ORDER — MIDAZOLAM HCL 2 MG/2ML IJ SOLN
INTRAMUSCULAR | Status: AC
  Filled 2020-02-10: qty 2

## 2020-02-10 MED ORDER — EPINEPHRINE HCL (NASAL) 0.1 % NA SOLN
NASAL | Status: AC
  Filled 2020-02-10: qty 30

## 2020-02-10 MED ORDER — FENTANYL CITRATE (PF) 250 MCG/5ML IJ SOLN
INTRAMUSCULAR | Status: DC | PRN
  Administered 2020-02-10: 50 ug via INTRAVENOUS

## 2020-02-10 MED ORDER — MIDAZOLAM HCL 5 MG/5ML IJ SOLN
INTRAMUSCULAR | Status: DC | PRN
  Administered 2020-02-10: 2 mg via INTRAVENOUS

## 2020-02-10 MED ORDER — CEFAZOLIN SODIUM-DEXTROSE 2-4 GM/100ML-% IV SOLN
INTRAVENOUS | Status: AC
  Filled 2020-02-10: qty 100

## 2020-02-10 MED ORDER — LACTATED RINGERS IV SOLN: INTRAVENOUS | Status: DC

## 2020-02-10 MED ORDER — ONDANSETRON HCL 4 MG/2ML IJ SOLN
INTRAMUSCULAR | Status: DC | PRN
  Administered 2020-02-10: 4 mg via INTRAVENOUS

## 2020-02-10 MED ORDER — ORAL CARE MOUTH RINSE: 15.0000 mL | Freq: Once | OROMUCOSAL | Status: AC

## 2020-02-10 MED ORDER — EPINEPHRINE HCL (NASAL) 0.1 % NA SOLN
NASAL | Status: DC | PRN
  Administered 2020-02-10: 30 mL via TOPICAL

## 2020-02-10 MED ORDER — OXYCODONE HCL 5 MG PO TABS: 5.0000 mg | ORAL_TABLET | Freq: Once | ORAL | Status: DC | PRN

## 2020-02-10 MED ORDER — OXYCODONE HCL 5 MG/5ML PO SOLN: 5.0000 mg | Freq: Once | ORAL | Status: DC | PRN

## 2020-02-10 MED ORDER — DEXTROSE 50 % IV SOLN
12.5000 g | INTRAVENOUS | Status: AC
  Administered 2020-02-10: 12.5 g via INTRAVENOUS

## 2020-02-10 MED ORDER — PHENYLEPHRINE HCL (PRESSORS) 10 MG/ML IV SOLN
INTRAVENOUS | Status: DC | PRN
  Administered 2020-02-10: 80 ug via INTRAVENOUS
  Administered 2020-02-10: 120 ug via INTRAVENOUS

## 2020-02-10 MED ORDER — PROPOFOL 10 MG/ML IV BOLUS
INTRAVENOUS | Status: DC | PRN
  Administered 2020-02-10: 160 mg via INTRAVENOUS

## 2020-02-10 MED ORDER — LIDOCAINE 2% (20 MG/ML) 5 ML SYRINGE
INTRAMUSCULAR | Status: DC | PRN
  Administered 2020-02-10: 100 mg via INTRAVENOUS

## 2020-02-10 MED ORDER — DEXTROSE 50 % IV SOLN
INTRAVENOUS | Status: AC
  Filled 2020-02-10: qty 50

## 2020-02-10 MED ORDER — OXYMETAZOLINE HCL 0.05 % NA SOLN
NASAL | Status: AC
  Filled 2020-02-10: qty 30

## 2020-02-10 MED ORDER — FENTANYL CITRATE (PF) 100 MCG/2ML IJ SOLN
25.0000 ug | INTRAMUSCULAR | Status: DC | PRN
Start: 2020-02-10 — End: 2020-02-10

## 2020-02-10 SURGICAL SUPPLY — 18 items
CANISTER SUCT 3000ML PPV (MISCELLANEOUS) ×2 IMPLANT
COVER BACK TABLE 60X90IN (DRAPES) ×2 IMPLANT
COVER WAND RF STERILE (DRAPES) ×2 IMPLANT
DRAPE HALF SHEET 40X57 (DRAPES) ×2 IMPLANT
GAUZE SPONGE 4X4 12PLY STRL LF (GAUZE/BANDAGES/DRESSINGS) ×2 IMPLANT
GLOVE BIO SURGEON STRL SZ7.5 (GLOVE) ×2 IMPLANT
GOWN STRL REUS W/ TWL LRG LVL3 (GOWN DISPOSABLE) ×2 IMPLANT
GOWN STRL REUS W/TWL LRG LVL3 (GOWN DISPOSABLE) ×2
GUARD TEETH (MISCELLANEOUS) ×2 IMPLANT
KIT BASIN OR (CUSTOM PROCEDURE TRAY) ×2 IMPLANT
KIT TURNOVER KIT B (KITS) ×2 IMPLANT
NEEDLE HYPO 25GX1X1/2 BEV (NEEDLE) IMPLANT
NS IRRIG 1000ML POUR BTL (IV SOLUTION) ×2 IMPLANT
PAD ARMBOARD 7.5X6 YLW CONV (MISCELLANEOUS) ×4 IMPLANT
PATTIES SURGICAL .5 X1 (DISPOSABLE) IMPLANT
SOL ANTI FOG 6CC (MISCELLANEOUS) IMPLANT
SOLUTION ANTI FOG 6CC (MISCELLANEOUS)
TUBE CONNECTING 12X1/4 (SUCTIONS) ×2 IMPLANT

## 2020-02-10 NOTE — Transfer of Care (Signed)
Immediate Anesthesia Transfer of Care Note  Patient: Brian [name]  Procedure(s) Performed: DIRECT LARYNGOSCOPY WITH BIOPSY (N/A Throat)  Patient Location: PACU  Anesthesia Type:General  Level of Consciousness: alert , patient cooperative and responds to stimulation  Airway & Oxygen Therapy: Patient Spontanous Breathing and Patient connected to face mask oxygen  Post-op Assessment: Report given to RN and Post -op Vital signs reviewed and stable  Post vital signs: Reviewed and stable  Last Vitals:  Vitals Value Taken Time  BP 152/79 02/10/20 0935  Temp    Pulse 77 02/10/20 0936  Resp 20 02/10/20 0936  SpO2 100 % 02/10/20 0936  Vitals shown include unvalidated device data.  Last Pain:  Vitals:   02/10/20 0729  TempSrc:   PainSc: 0-No pain         Complications: No complications documented.

## 2020-02-10 NOTE — H&P (Signed)
Cc: Bilateral neck masses  HPI: The patient is a 64 year old male who presents today for evaluation of his bilateral neck masses.  According to the patient, he first noted swelling in his left neck approximately 1 month ago.  The size of the mass has gradually increased.  He was seen by his primary care physician, and was noted to have bilateral neck masses.  His subsequent neck CT scan showed bulky bilateral upper jugular chain lymphadenopathy.  Both IJ's were compressed and occluded. There also was an invasion of bilateral sternocleidomastoid muscles.  The neck masses measured 5.1 cm on the left and 6.8 cm on the right.  The CT scan also showed possible soft tissue mass at the right posterior tongue/glossal tonsillar sulcus.  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  The patient currently denies any dysphagia, odynophagia, dyspnea or hoarseness.  He denies the use of cigarettes.  He smokes marijuana regularly.  The patient has a history of recurrent childhood otitis media.  He underwent multiple bilateral ear surgeries.  He denies any other ENT surgery.   The patient's review of systems (constitutional, eyes, ENT, cardiovascular, respiratory, GI, musculoskeletal, skin, neurologic, psychiatric, endocrine, hematologic, allergic) is noted in the ROS questionnaire.  It is reviewed with the patient.  Major events: Tonsillectomy.  Ongoing medical problems: Diabetes, Stroke, hypertension, arthritis.  Family health history: No HTN, DM, CAD, hearing loss or bleeding disorder.  Social history: The patient is divorced. He denies the use of tobacco. He drinks alcohol and smokes marijuana on occasions.    Exam: General: Communicates without difficulty, well nourished, no acute distress. Head: Normocephalic, no evidence injury, no tenderness, facial buttresses intact without stepoff. Face/sinus: No tenderness to palpation and percussion. Facial movement is normal  and symmetric. Eyes: PERRL, EOMI. No scleral icterus, conjunctivae clear. Neuro: CN II exam reveals vision grossly intact.  No nystagmus at any point of gaze. Ears: Auricles well formed without lesions.  Ear canals are intact without mass or lesion.  No erythema or edema is appreciated.  The TMs are intact without fluid. Nose: External evaluation reveals normal support and skin without lesions.  Dorsum is intact.  Anterior rhinoscopy reveals pink mucosa over anterior aspect of inferior turbinates and intact septum.  No purulence noted. Oral:  Oral cavity and oropharynx are intact, symmetric, without erythema or edema.  Mucosa is moist. His right tonsillar tissue is slightly larger than the left. Neck: Bilateral large neck masses. They are fixed and firm to touch. Trachea is midline. Neuro:  CN 2-12 grossly intact. Gait normal.   (NOTE: The laryngoscopy was done previously in my clinic, not at [facility]) Procedure:  Flexible Fiberoptic Laryngoscopy Risks, benefits, and alternatives of flexible endoscopy were explained to the patient.  Specific mention was made of the risk of throat numbness with difficulty swallowing, possible bleeding from the nose and mouth, and pain from the procedure.  The patient gave oral consent to proceed.  The flexible scope was inserted into the right nasal cavity and advanced towards the nasopharynx.  Visualized mucosa over the turbinates and septum were as described above.  The nasopharynx was clear.  Oropharyngeal walls with slightly enlarged right tonsillar tissue.  Hypopharynx was without  lesion or edema.  Larynx was mobile without lesions. Supraglottic structures were free of edema, mass, and asymmetry.  True vocal folds were white without mass or lesion.  Base of tongue was within normal limits.   Assessment  1.  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  2.  The patient's CT scan suggests a possible primary of the right oropharynx.  However, no obvious ulcerative mass is noted on today's physical exam and laryngoscopy evaluation.  His right tonsillar tissue is slightly larger than the left.  3.  The patient's airway is widely patent.  His vocal cords are mobile bilaterally.   Plan  1.  The physical exam and laryngoscopy findings are reviewed with the patient.  The CT results are also discussed.  2.  Based on the above findings, the patient will benefit from undergoing direct laryngoscopy and biopsy of his right oropharyngeal areas.  The risks, benefits, alternatives and details of the procedures are discussed.   3.  The patient would like to proceed with the procedure.

## 2020-02-10 NOTE — Anesthesia Procedure Notes (Signed)
Procedure Name: Intubation Date/Time: 02/10/2020 9:06 AM Performed by: Glynda Jaeger, CRNA Pre-anesthesia Checklist: Patient identified, Patient being monitored, Timeout performed, Emergency Drugs available and Suction available Patient Re-evaluated:Patient Re-evaluated prior to induction Oxygen Delivery Method: Circle System Utilized Preoxygenation: Pre-oxygenation with 100% oxygen Induction Type: IV induction Laryngoscope Size: Glidescope and 4 Grade View: Grade I Tube type: Oral Tube size: 7.5 mm Number of attempts: 1 Airway Equipment and Method: Video-laryngoscopy Placement Confirmation: ETT inserted through vocal cords under direct vision,  positive ETCO2 and breath sounds checked- equal and bilateral Secured at: 21 cm Tube secured with: Tape Dental Injury: Teeth and Oropharynx as per pre-operative assessment  Future Recommendations: Recommend- induction with short-acting agent, and alternative techniques readily available

## 2020-02-10 NOTE — Anesthesia Postprocedure Evaluation (Signed)
Anesthesia Post Note  Patient: Brian [name]  Procedure(s) Performed: DIRECT LARYNGOSCOPY WITH BIOPSY (N/A Throat)     Patient location during evaluation: PACU Anesthesia Type: General Level of consciousness: awake and alert Pain management: pain level controlled Vital Signs Assessment: post-procedure vital signs reviewed and stable Respiratory status: spontaneous breathing, nonlabored ventilation, respiratory function stable and patient connected to nasal cannula oxygen Cardiovascular status: blood pressure returned to baseline and stable Postop Assessment: no apparent nausea or vomiting Anesthetic complications: no   No complications documented.  Last Vitals:  Vitals:   02/10/20 1020 02/10/20 1035  BP: (!) 148/80 138/74  Pulse: 67 72  Resp: 20 20  Temp:  36.8 C  SpO2: 94% 96%    Last Pain:  Vitals:   02/10/20 1035  TempSrc:   PainSc: 0-No pain                 Barnet Glasgow

## 2020-02-10 NOTE — Op Note (Signed)
DATE OF PROCEDURE:  02/10/2020                              OPERATIVE REPORT  SURGEON:  [name], MD  PREOPERATIVE DIAGNOSES: 1. Right tonsillar mass  POSTOPERATIVE DIAGNOSES: 1. Right tonsillar mass  PROCEDURE PERFORMED:  Direct laryngoscopy with biopsy  ANESTHESIA:  General endotracheal tube anesthesia.  COMPLICATIONS:  None.  ESTIMATED BLOOD LOSS:  Minimal.  INDICATION FOR PROCEDURE:  Brian [name] is a 64 y.o. male who noted increasing left neck swelling 1 month ago.  He subsequently underwent a neck CT scan.  The CT showed bulky bilateral upper jugular chain lymphadenopathy.  Both IJ's were compressed and occluded. There also was an invasion of bilateral sternocleidomastoid muscles.  The neck masses measured 5.1 cm on the left and 6.8 cm on the right.  The CT scan also showed possible soft tissue mass at the right posterior tongue/glossal tonsillar sulcus.  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 Based on the above findings, the decision was made for the patient to undergo the above-stated procedure.  Surgical the risks, benefits, alternatives, and details of the procedure were discussed with the patient.  Questions were invited and answered.  Informed consent was obtained.  DESCRIPTION:  The patient was taken to the operating room and placed supine on the operating table.  General endotracheal tube anesthesia was administered by the anesthesiologist.  The patient was positioned and prepped and draped in a standard fashion for direct laryngoscopy.  A Dedo laryngoscope was used for examination.  The laryngoscope was inserted via the oral cavity into the pharynx.  Examination of the oropharynx revealed an ulcerative mass at the inferior portion of the right tonsillar sulcus.  The epiglottis, aryepiglottic folds, and the vocal cords were otherwise normal.  3 large biopsy specimens were obtained using a cup forceps.  The specimens were  sent to the pathology department for permanent histologic identification.  Hemostasis was achieved with pledgets soaked with epinephrine.  The Dedo laryngoscope was withdrawn.  The care of the patient was turned over to the anesthesiologist.  The patient was awakened from anesthesia without difficulty.  The patient was extubated and transferred to the recovery room in good condition.  OPERATIVE FINDINGS:  An ulcerated soft tissue mass was noted on the inferior aspect of the right tonsillar sulcus.  SPECIMEN: Right tonsillar biopsy specimens  FOLLOWUP CARE:  The patient will be discharged home once awake and alert. The patient will follow up in my office in 1 week.  Brian [name] 02/10/2020 9:27 AM

## 2020-02-10 NOTE — Progress Notes (Signed)
Hypoglycemic Event  CBG: 61  Treatment: 25 g of D50  Symptoms:none  Follow-up CBG: Time:1015 CBG Result:54  Possible Reasons for Event: NPO  Comments/MD notified:Dr. Valma Cava  Treatment: 120 mL of cranberry juice  Symptoms: none  Follow-up CBG: Time: 1030  CBG Results: [place]

## 2020-02-10 NOTE — Discharge Instructions (Addendum)
The patient may resume all his previous activities and diet.  He may use Tylenol or ibuprofen as needed for pain.  The patient is scheduled to return to my office in 1 week for follow-up.

## 2020-02-11 ENCOUNTER — Encounter (HOSPITAL_COMMUNITY): Payer: Self-pay | Admitting: Otolaryngology

## 2020-02-11 ENCOUNTER — Other Ambulatory Visit: Payer: Self-pay | Admitting: Family Medicine

## 2020-02-11 DIAGNOSIS — C140 Malignant neoplasm of pharynx, unspecified: Secondary | ICD-10-CM

## 2020-02-15 LAB — SURGICAL PATHOLOGY

## 2020-02-16 ENCOUNTER — Other Ambulatory Visit (HOSPITAL_COMMUNITY): Payer: Self-pay | Admitting: Otolaryngology

## 2020-02-16 DIAGNOSIS — C109 Malignant neoplasm of oropharynx, unspecified: Secondary | ICD-10-CM

## 2020-02-17 DIAGNOSIS — C140 Malignant neoplasm of pharynx, unspecified: Secondary | ICD-10-CM | POA: Diagnosis not present

## 2020-02-19 ENCOUNTER — Other Ambulatory Visit: Payer: Self-pay | Admitting: Family Medicine

## 2020-02-26 ENCOUNTER — Inpatient Hospital Stay: Payer: Medicare Other | Attending: Hematology and Oncology | Admitting: Hematology and Oncology

## 2020-02-26 ENCOUNTER — Inpatient Hospital Stay: Payer: Medicare Other

## 2020-02-26 ENCOUNTER — Ambulatory Visit (HOSPITAL_COMMUNITY)
Admission: RE | Admit: 2020-02-26 | Discharge: 2020-02-26 | Disposition: A | Discharge status: Home / Self Care | Payer: Medicare Other | Source: Ambulatory Visit | Attending: Otolaryngology | Admitting: Otolaryngology

## 2020-02-26 ENCOUNTER — Other Ambulatory Visit: Payer: Self-pay

## 2020-02-26 DIAGNOSIS — N179 Acute kidney failure, unspecified: Secondary | ICD-10-CM | POA: Insufficient documentation

## 2020-02-26 DIAGNOSIS — R918 Other nonspecific abnormal finding of lung field: Secondary | ICD-10-CM | POA: Insufficient documentation

## 2020-02-26 DIAGNOSIS — I6389 Other cerebral infarction: Secondary | ICD-10-CM | POA: Diagnosis not present

## 2020-02-26 DIAGNOSIS — C109 Malignant neoplasm of oropharynx, unspecified: Secondary | ICD-10-CM | POA: Diagnosis not present

## 2020-02-26 DIAGNOSIS — Z5111 Encounter for antineoplastic chemotherapy: Secondary | ICD-10-CM | POA: Insufficient documentation

## 2020-02-26 DIAGNOSIS — G4700 Insomnia, unspecified: Secondary | ICD-10-CM | POA: Insufficient documentation

## 2020-02-26 DIAGNOSIS — I700 Atherosclerosis of aorta: Secondary | ICD-10-CM | POA: Insufficient documentation

## 2020-02-26 DIAGNOSIS — Z23 Encounter for immunization: Secondary | ICD-10-CM | POA: Insufficient documentation

## 2020-02-26 DIAGNOSIS — I10 Essential (primary) hypertension: Secondary | ICD-10-CM | POA: Insufficient documentation

## 2020-02-26 DIAGNOSIS — I2510 Atherosclerotic heart disease of native coronary artery without angina pectoris: Secondary | ICD-10-CM | POA: Diagnosis not present

## 2020-02-26 DIAGNOSIS — M2578 Osteophyte, vertebrae: Secondary | ICD-10-CM | POA: Diagnosis not present

## 2020-02-26 DIAGNOSIS — E119 Type 2 diabetes mellitus without complications: Secondary | ICD-10-CM | POA: Insufficient documentation

## 2020-02-26 DIAGNOSIS — I4891 Unspecified atrial fibrillation: Secondary | ICD-10-CM | POA: Insufficient documentation

## 2020-02-26 LAB — GLUCOSE, CAPILLARY: Glucose-Capillary: 175 mg/dL — ABNORMAL HIGH (ref 70–99)

## 2020-02-26 MED ORDER — FLUDEOXYGLUCOSE F - 18 (FDG) INJECTION
10.4000 | Freq: Once | INTRAVENOUS | Status: AC | PRN
  Administered 2020-02-26: 10.4 via INTRAVENOUS

## 2020-02-29 NOTE — Progress Notes (Addendum)
Radiation Oncology         [phone] ________________________________  Initial Outpatient Consultation  Name: Brian [name] MRN: [id number]  Date: 03/01/2020  DOB: 09-May-1955  DX:AJOINOM, Cammie Mcgee, MD  [name], MD   REFERRING PHYSICIAN: [name], MD  DIAGNOSIS: C01    ICD-10-CM   1. 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 Malignant neoplasm of base of tongue ([place])  C01    Cancer Staging Malignant neoplasm of base of tongue ([place]) Staging form: Pharynx - HPV-Mediated Oropharynx, AJCC 8th Edition - Clinical stage from 03/01/2020: Stage III (cT2, cN3, cM0, p16+) - Signed by [name], MD on 03/02/2020   CHIEF COMPLAINT: Here to discuss management of right oropharyngeal cancer  HISTORY OF PRESENT ILLNESS::Brian [name] is a 65 y.o. male who presented two-week history of facial swelling and upper neck swelling in late November 2021.  The patient did not realize he had these symptoms but it was noted by a friend around Thanksgiving.  He underwent CT scan of the neck, results below.  Subsequently, the patient saw Dr. Benjamine Mola, who performed a direct laryngoscopy with biopsy on the date of 02/10/2020. Final pathology revealed: squamous cell carcinoma.  p16 positive.  Pertinent imaging thus far includes: 1. Soft tissue neck CT scan performed on 01/28/2020 revealing right glossotonsillar malignancy with bulky bilateral nodal disease. Nodal masses sowed definite extracapsular spread. There was also noted to be incidental advanced cervical spine degeneration with cord compression. 2. PET scan performed on 02/26/2020 revealing a hypermetabolic lesion at the right base of the tongue and right tonsil, consistent with oropharyngeal carcinoma. Carcinoma appeared to be confined to the mucosal surface. There were also noted to bulky bilateral hypermetabolic level II metastatic lymph nodes and smaller inferior  hypermetabolic level II metastatic lymph nodes. There was no evidence of metastatic disease in the chest, abdomen, or pelvis. The two small right middle lobe pulmonary nodules present on CT from 2012 were consistent with benign etiology.  Swallowing issues, if any: He denies  Weight Changes:  He denies.     Tobacco history, if any: He smokes a significant amount of marijuana; denies tobacco abuse  ETOH abuse, if any: Denies -he does drink alcohol on occasion  The patient also has a history of significant diabetes.  He is here with his son today who reports that he has come close to entering diabetic comas due to poorly controlled glucose levels   He has not received his COVID vaccination; he reports concern about losing a limb secondary to the vaccination though he does not know of any specific cases where that has occurred.     PREVIOUS RADIATION THERAPY: No  PAST MEDICAL HISTORY:  has a past medical history of Arthritis, Atrial fibrillation ([place]), Cardiomyopathy, Cerebrovascular accident ([place]), Diabetes mellitus, Hyperlipidemia, Hypertension, Left-sided weakness, 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 ([place]), and Seizures ([place]).    PAST SURGICAL HISTORY: Past Surgical History:  Procedure Laterality Date  . DIRECT LARYNGOSCOPY N/A 02/10/2020   Procedure: DIRECT LARYNGOSCOPY WITH BIOPSY;  Surgeon: [name], MD;  Location: [place];  Service: ENT;  Laterality: N/A;  . Ear surgery as a child    . TONSILLECTOMY      FAMILY HISTORY: family history includes Hyperlipidemia in his father; Hypertension in his father.  SOCIAL HISTORY:  reports that he has never smoked. He has never used smokeless tobacco. He reports current alcohol use. He reports current drug use.  Frequency: 7.00 times per week.  ALLERGIES: Codeine  MEDICATIONS:  Current Outpatient Medications  Medication Sig Dispense Refill  . amLODipine (NORVASC) 10 MG tablet Take 1 tablet (10 mg total) by mouth daily. 90 tablet 1  .  atorvastatin (LIPITOR) 20 MG tablet TAKE 1 TABLET (20 MG TOTAL) DAILY BY MOUTH. 90 tablet 3  . carvedilol (COREG) 25 MG tablet TAKE 1 TABLET BY MOUTH TWICE A DAY WITH A MEAL (Patient taking differently: Take 25 mg by mouth 2 (two) times daily with a meal.) 180 tablet 3  . glucose blood (ONE TOUCH ULTRA TEST) test strip CHECK BLOOD SUGAR FOUR TIMES DAILY (e11.9) 400 each 3  . hydrALAZINE (APRESOLINE) 50 MG tablet TAKE 1 TABLET BY MOUTH THREE TIMES A DAY (Patient taking differently: Take 50 mg by mouth 3 (three) times daily.) 270 tablet 1  . Insulin Pen Needle (B-D UF III MINI PEN NEEDLES) 31G X 5 MM MISC USE DAILY WITH INSULIN 100 each 5  . Insulin Syringe-Needle U-100 (INSULIN SYRINGE .3CC/31GX5/16") 31G X 5/16" 0.3 ML MISC Use twice daily to inject insulin. 100 each 10  . LEVEMIR FLEXTOUCH 100 UNIT/ML FlexPen INJECT 70 UNITS UNDER THE SKIN EVERY MORNING AND 40 UNITS AT BEDTIME 15 mL 3  . losartan (COZAAR) 50 MG tablet Take 1 tablet (50 mg total) by mouth daily. 90 tablet 3  . pioglitazone (ACTOS) 15 MG tablet Take 1 tablet (15 mg total) by mouth daily. 90 tablet 2  . warfarin (COUMADIN) 10 MG tablet Take 1 tablet (10 mg total) by mouth daily. 90 tablet 3  . furosemide (LASIX) 20 MG tablet TAKE 1 TABLET (20 MG TOTAL) BY MOUTH DAILY AS NEEDED FOR FLUID. (Patient not taking: No sig reported) 90 tablet 1  . tadalafil (CIALIS) 20 MG tablet Take 0.5-1 tablets (10-20 mg total) by mouth every other day as needed for erectile dysfunction. (Patient not taking: No sig reported) 5 tablet 11  . TRULICITY 0.07 MA/2.6JF SOPN INJECT 0.75 MG INTO THE SKIN ONCE A WEEK. (Patient not taking: No sig reported) 4 pen 11   No current facility-administered medications for this encounter.    REVIEW OF SYSTEMS:  Notable for that above.   PHYSICAL EXAM:  height is 5\' 9"  (1.753 m) and weight is 213 lb 4 oz (96.7 kg). His temporal temperature is 97.3 F (36.3 C) (abnormal). His blood pressure is 203/108 (abnormal) and his  pulse is 85. His respiration is 18 and oxygen saturation is 99%.   General: Alert and oriented, in no acute distress HEENT:  Extraocular movements are intact. Oropharynx is notable for tongue at midline.  No visible lesions in the upper throat.  Patient has a brisk gag reflex which makes exam difficult. Neck: Neck is notable for bulky adenopathy in the bilateral neck, level 2 (nodal masses are 5 to 6 cm bilaterally).  Lymphedema in the upper neck. Heart: Regular in rate and rhythm with no murmurs, rubs, or gallops. Chest: Clear to auscultation bilaterally, with no rhonchi, wheezes, or rales. Abdomen: Soft, nontender, nondistended, with no rigidity or guarding. Extremities: Pitting edema in lower extremities bilaterally  lymphatics: see Neck Exam Skin: No evidence of tumor ulceration over neck Musculoskeletal: Ambulatory Neurologic: Cranial nerves II through XII are grossly intact. No obvious focalities. Speech is fluent. Coordination is intact. Psychiatric: Judgment and insight are intact. Affect is appropriate.   ECOG = 1  0 - Asymptomatic (Fully active, able to carry on all predisease activities without restriction)  1 - Symptomatic  but completely ambulatory (Restricted in physically strenuous activity but ambulatory and able to carry out work of a light or sedentary nature. For example, light housework, office work)  2 - Symptomatic, <50% in bed during the day (Ambulatory and capable of all self care but unable to carry out any work activities. Up and about more than 50% of waking hours)  3 - Symptomatic, >50% in bed, but not bedbound (Capable of only limited self-care, confined to bed or chair 50% or more of waking hours)  4 - Bedbound (Completely disabled. Cannot carry on any self-care. Totally confined to bed or chair)  5 - Death   Eustace Pen MM, Creech RH, Tormey DC, et al. [phone]). "Toxicity and response criteria of the [facility]". [place] Oncol. 5 (6):  649-55   LABORATORY DATA:  Lab Results  Component Value Date   WBC 6.7 02/08/2020   HGB 12.7 (L) 02/08/2020   HCT 39.2 02/08/2020   MCV 86.7 02/08/2020   PLT 220 02/08/2020   CMP     Component Value Date/Time   NA 136 02/08/2020 1430   K 3.9 02/08/2020 1430   CL 101 02/08/2020 1430   CO2 24 02/08/2020 1430   GLUCOSE 95 02/08/2020 1430   BUN 19 02/08/2020 1430   CREATININE 1.26 (H) 02/08/2020 1430   CREATININE 1.23 11/30/2019 0915   CALCIUM 9.0 02/08/2020 1430   PROT 6.4 11/30/2019 0915   ALBUMIN 4.2 09/25/2016 0914   AST 11 11/30/2019 0915   ALT 14 11/30/2019 0915   ALKPHOS 62 09/25/2016 0914   BILITOT 0.5 11/30/2019 0915   GFRNONAA >60 02/08/2020 1430   GFRNONAA 61 11/30/2019 0915   GFRAA 70 11/30/2019 0915      Lab Results  Component Value Date   TSH 0.97 03/08/2017     RADIOGRAPHY: NM PET Image Initial (PI) Skull Base To Thigh  Result Date: 02/26/2020 CLINICAL DATA:  Initial treatment strategy for oropharyngeal carcinoma. EXAM: NUCLEAR MEDICINE PET SKULL BASE TO THIGH TECHNIQUE: 10.4 mCi F-18 FDG was injected intravenously. Full-ring PET imaging was performed from the skull base to thigh after the radiotracer. CT data was obtained and used for attenuation correction and anatomic localization. Fasting blood glucose: 4 170 mg/dl COMPARISON:  neck CT 01/28/2020 CT 09/20/2010 FINDINGS: Mediastinal blood pool activity: SUV max 3.1 Liver activity: SUV max 4.2 NECK: Intense hypermetabolic activity localizes to the RIGHT base of tongue and extends into the RIGHT lingual tonsil. Activity is confined to the mucosal layer. Activity is intense with SUV max equal 10.3 (image 48). Bilateral large bulky intensely hypermetabolic level II lymph nodes (SUV max equal 14). Nodal mass on the RIGHT measures 4.4 cm and on the LEFT 4.8 cm. Inferior to the dominant masses are there several additional small hypermetabolic level II lymph nodes on the LEFT and RIGHT at the level the hyoid. For  example 8 mm LEFT inferior level II node (image 51) with SUV max equal 8.9. No more inferior hypermetabolic nodes are identified. No supraclavicular hypermetabolic nodes. Incidental CT findings: Remote infarction noted in the RIGHT parietal lobe. CHEST: No hypermetabolic mediastinal or hilar nodes. Two small pulmonary nodules in the inferior RIGHT middle lobe measure 3 mm and 4 mm on image 102 and 104 series 4. The small pulmonary nodules do not have radiotracer activity. Additionally nodules are present on CT from 09/20/2010. Incidental CT findings: Coronary artery calcification and aortic atherosclerotic calcification. ABDOMEN/PELVIS: No abnormal hypermetabolic activity within the liver, pancreas, adrenal glands, or  spleen. No hypermetabolic lymph nodes in the abdomen or pelvis. Incidental CT findings: none SKELETON: No focal hypermetabolic activity to suggest skeletal metastasis. Incidental CT findings: Degenerative osteophytosis of the spine. IMPRESSION: 1. Hypermetabolic lesion at the RIGHT base of tongue and RIGHT tonsil consistent with oropharyngeal carcinoma. Carcinoma appears confined to the mucosal surface. 2. Bulky bilateral hypermetabolic level II metastatic lymph nodes. Smaller inferior hypermetabolic level II metastatic lymph nodes. 3. No evidence of metastatic disease in the chest, abdomen, or pelvis. 4. Two small RIGHT middle lobe pulmonary nodules are present on CT from 2012 consistent with benign etiology Electronically Signed   By: Suzy Bouchard M.D.   On: 02/26/2020 10:36      IMPRESSION/PLAN: Right oropharyngeal cancer /base of tongue squamous cell carcinoma, p16 positive, with bulky adenopathy in the bilateral neck  This is a delightful patient with head and neck cancer. I highly recommend radiotherapy over a 7-week regimen for this patient.  We discussed the potential risks, benefits, and side effects of radiotherapy. We talked in detail about acute and late effects. We discussed  that some of the most bothersome acute effects may be mucositis, dysgeusia, salivary changes, skin irritation, hair loss, dehydration, weight loss and fatigue. We talked about late effects which include but are not necessarily limited to dysphagia, hypothyroidism, nerve injury, vascular injury, spinal cord injury, xerostomia, trismus, neck edema, and potential injury to any of the tissues in the head and neck region. No guarantees of treatment were given. A consent form was signed and placed in the patient's medical record. The patient is enthusiastic about proceeding with treatment. I look forward to participating in the patient's care.    Simulation (treatment planning) will take place once he is cleared by dentistry  We also discussed that the treatment of head and neck cancer is a multidisciplinary process to maximize treatment outcomes and quality of life. For this reason the following referrals have been or will be made:   Medical oncology to discuss chemotherapy (due to the advanced nature of his disease his prognosis would improve with concurrent chemotherapy.  He understands that he needs to be evaluated to determine if he can tolerate concurrent chemotherapy given his other medical issues)   Dentistry for dental evaluation, possible extractions in the radiation fields, and /or advice on reducing risk of cavities, osteoradionecrosis, or other oral issues. (I have sent as she estimating isodose curves to dentistry)   Nutritionist for nutrition support during and after treatment.   Speech language pathology for swallowing and/or speech therapy.   Social work for social support.    Physical therapy due to risk of lymphedema in neck and deconditioning.   Baseline labs including TSH.  We talked about the likelihood of needing a feeding tube (PEG tube) if he undergoes concurrent chemotherapy.  He has elevated blood pressure but denies any acute symptoms related to this.  He has a history of  poorly controlled diabetes.  We will contact his primary doctor (Dr. Dennard Schaumann) to see if this can be managed closely while he goes through his oncologic treatments.  We discussed measures to reduce the risk of infection during the COVID-19 pandemic.  I had a lengthy discussion with him about why I highly recommend he get vaccinated.  He had some concerns about the vaccine that are not based in medical data.  I provided reassurance to him and we talked about realistic side effects from the vaccine.  He understands that getting vaccinated is not necessarily going to prevent  him from getting infected with COVID but it would substantially reduce his risk of hospitalization and death.  He understands that he will need to be fully vaccinated to procure these benefits.  He is willing to get vaccinated now.  We will arrange for him to get vaccinated today or tomorrow depending on vaccine availability at the cancer center.  He understands the importance of wearing high-quality masks and reducing exposures to other people to mitigate risk of infection.  On date of service, in total, I spent 65 minutes on this encounter. Patient was seen in person.  __________________________________________   [name], MD  This document serves as a record of services personally performed by [name], MD. It was created on his behalf by Clerance Lav, a trained medical scribe. The creation of this record is based on the scribe's personal observations and the provider's statements to them. This document has been checked and approved by the attending provider.

## 2020-03-01 ENCOUNTER — Ambulatory Visit
Admission: RE | Admit: 2020-03-01 | Discharge: 2020-03-01 | Disposition: A | Discharge status: Home / Self Care | Payer: Medicare Other | Source: Ambulatory Visit | Attending: Radiation Oncology | Admitting: Radiation Oncology

## 2020-03-01 ENCOUNTER — Encounter: Payer: Self-pay | Admitting: Radiation Oncology

## 2020-03-01 ENCOUNTER — Other Ambulatory Visit: Payer: Self-pay

## 2020-03-01 ENCOUNTER — Telehealth: Payer: Self-pay | Admitting: Hematology and Oncology

## 2020-03-01 VITALS — BP 203/108 | HR 85 | Temp 97.3°F | Resp 18 | Ht 69.0 in | Wt 213.2 lb

## 2020-03-01 DIAGNOSIS — I10 Essential (primary) hypertension: Secondary | ICD-10-CM | POA: Diagnosis not present

## 2020-03-01 DIAGNOSIS — Z7901 Long term (current) use of anticoagulants: Secondary | ICD-10-CM | POA: Insufficient documentation

## 2020-03-01 DIAGNOSIS — Z79899 Other long term (current) drug therapy: Secondary | ICD-10-CM | POA: Insufficient documentation

## 2020-03-01 DIAGNOSIS — C01 Malignant neoplasm of base of tongue: Secondary | ICD-10-CM

## 2020-03-01 DIAGNOSIS — E785 Hyperlipidemia, unspecified: Secondary | ICD-10-CM | POA: Diagnosis not present

## 2020-03-01 DIAGNOSIS — I4891 Unspecified atrial fibrillation: Secondary | ICD-10-CM | POA: Insufficient documentation

## 2020-03-01 DIAGNOSIS — C108 Malignant neoplasm of overlapping sites of oropharynx: Secondary | ICD-10-CM | POA: Diagnosis not present

## 2020-03-01 DIAGNOSIS — G40909 Epilepsy, unspecified, not intractable, without status epilepticus: Secondary | ICD-10-CM | POA: Diagnosis not present

## 2020-03-01 DIAGNOSIS — Z8673 Personal history of transient ischemic attack (TIA), and cerebral infarction without residual deficits: Secondary | ICD-10-CM | POA: Insufficient documentation

## 2020-03-01 DIAGNOSIS — Z794 Long term (current) use of insulin: Secondary | ICD-10-CM | POA: Diagnosis not present

## 2020-03-01 DIAGNOSIS — I429 Cardiomyopathy, unspecified: Secondary | ICD-10-CM | POA: Diagnosis not present

## 2020-03-01 DIAGNOSIS — M129 Arthropathy, unspecified: Secondary | ICD-10-CM | POA: Insufficient documentation

## 2020-03-01 DIAGNOSIS — R599 Enlarged lymph nodes, unspecified: Secondary | ICD-10-CM | POA: Diagnosis not present

## 2020-03-01 DIAGNOSIS — E119 Type 2 diabetes mellitus without complications: Secondary | ICD-10-CM | POA: Diagnosis not present

## 2020-03-01 DIAGNOSIS — E11319 Type 2 diabetes mellitus with unspecified diabetic retinopathy without macular edema: Secondary | ICD-10-CM | POA: Diagnosis not present

## 2020-03-01 DIAGNOSIS — E1165 Type 2 diabetes mellitus with hyperglycemia: Secondary | ICD-10-CM | POA: Diagnosis not present

## 2020-03-01 DIAGNOSIS — Z923 Personal history of irradiation: Secondary | ICD-10-CM | POA: Insufficient documentation

## 2020-03-01 NOTE — Telephone Encounter (Signed)
Rescheduled appointments per 1/11 inbasket msg. Spoke to patient who is aware of appointments date and time.

## 2020-03-01 NOTE — Progress Notes (Signed)
Oncology Nurse Navigator Documentation  Met with patient before initial consult with Dr. Isidore Moos. He was accompanied by his son. . Further introduced myself as his/their Navigator, explained my role as a member of the Care Team. . Provided New Patient Information packet: o Contact information for physician, this navigator, other members of the Care Team o Advance Directive information ([place] blue pamphlet with [name] insert); provided [facility] AD booklet at his request,  o Fall Prevention Patient [place] sheet o Symptom Management Clinic information o [facility] map with highlight of [place] o SLP Information sheet . Provided and discussed educational handouts for PEG and PAC. Marland Kitchen Assisted with post-consult appt scheduling. . They verbalized understanding of information provided. . I encouraged them to call with questions/concerns moving forward.  Harlow Asa, RN, BSN, OCN Head & Neck Oncology Nurse [place] at [place] [phone]

## 2020-03-01 NOTE — Progress Notes (Signed)
Oncology Nurse Navigator Documentation  Placed introductory call to new referral patient Brian [name]  Introduced myself as the H&N oncology nurse navigator that works with Dr. Isidore Moos and Dr. Chryl Heck to whom he has been referred by Dr. Benjamine Mola. He confirmed understanding of referral.  Briefly explained my role as his navigator, provided my contact information.   Confirmed understanding of upcoming appts and [place] location, explained arrival and registration process.  I explained the purpose of a dental evaluation prior to starting RT, and he is scheduled to see [name] tomorrow.   I encouraged him to call with questions/concerns as he moves forward with appts and procedures.    He verbalized understanding of information provided, expressed appreciation for my call.   Navigator Initial Assessment . Employment Status: he is working part time . Currently on FMLA / STD: no . Living Situation: He lives with his mother . Support System: mother . PCP: Jenna Luo MD . PCD: no . Financial Concerns:no . Transportation Needs: no . Sensory Deficits:no . Language Barriers/Interpreter Needed:  no . Ambulation Needs: no . DME Used in Home: no . Psychosocial Needs:  no . Concerns/Needs Understanding Cancer:  addressed/answered by navigator to best of ability . Self-Expressed Needs: no   Harlow Asa RN, BSN, OCN Head & Neck Oncology Nurse [place] at [facility] Phone # [phone]  Fax # [phone]

## 2020-03-01 NOTE — Progress Notes (Signed)
Head and Neck Cancer Location of Tumor / Histology: Squamous cell carcinoma of the oropharynx (p16+)  Patient presented with symptoms of: noted increasing left neck swelling ~2 months ago.  He subsequently underwent a neck CT scan: 01/28/2020 IMPRESSION: 1. Findings of right glossotonsillar malignancy with bulky bilateral nodal disease. Nodal masses show definite extracapsular spread. 2. Incidental advanced cervical spine degeneration with cord compression.  PET Scan --02/26/2020 IMPRESSION: 1. Hypermetabolic lesion at the RIGHT base of tongue and RIGHT tonsil consistent with oropharyngeal carcinoma. Carcinoma appears confined to the mucosal surface. 2. Bulky bilateral hypermetabolic level II metastatic lymph nodes. Smaller inferior hypermetabolic level II metastatic lymph nodes. 3. No evidence of metastatic disease in the chest, abdomen, or pelvis. 4. Two small RIGHT middle lobe pulmonary nodules are present on CT from 2012 consistent with benign etiology  Biopsies revealed:  02/09/2021 FINAL MICROSCOPIC DIAGNOSIS:  A. OROPHARYNGEAL, RIGHT, BIOPSY:  -  Squamous cell carcinoma ADDENDUM:  P16 immunohistochemistry is POSITIVE.   Nutrition Status Yes No Comments  Weight changes? []  [x]    Swallowing concerns? []  [x]    PEG? []  [x]     Referrals Yes No Comments  Social Work? [x]  []    Dentistry? [x]  []    Swallowing therapy? [x]  []    Nutrition? [x]  []    Med/Onc? [x]  []  Dr. Arletha Pili Iruku   Safety Issues Yes No Comments  Prior radiation? []  [x]    Pacemaker/ICD? []  [x]    Possible current pregnancy? []  [x]  N/A  Is the patient on methotrexate? []  [x]     Tobacco/Marijuana/Snuff/ETOH use:  He denies the use of tobacco. Does alcohol and smokes marijuana on occasions.  Past/Anticipated interventions by otolaryngology, if any:  02/10/2020 Dr. [name] Direct laryngoscopy with biopsy  Past/Anticipated interventions by medical oncology, if any:  Scheduled to see Dr. Arletha Pili Iruku on  03/04/2019  Current Complaints / other details:   Has not received COVID vaccine

## 2020-03-02 ENCOUNTER — Encounter: Payer: Self-pay | Admitting: General Practice

## 2020-03-02 ENCOUNTER — Ambulatory Visit: Payer: Medicare Other

## 2020-03-02 ENCOUNTER — Ambulatory Visit (HOSPITAL_COMMUNITY): Payer: Self-pay | Admitting: Dentistry

## 2020-03-02 ENCOUNTER — Inpatient Hospital Stay: Payer: Medicare Other

## 2020-03-02 DIAGNOSIS — K0530 Chronic periodontitis, unspecified: Secondary | ICD-10-CM

## 2020-03-02 DIAGNOSIS — Z23 Encounter for immunization: Secondary | ICD-10-CM | POA: Diagnosis not present

## 2020-03-02 DIAGNOSIS — K045 Chronic apical periodontitis: Secondary | ICD-10-CM

## 2020-03-02 DIAGNOSIS — C01 Malignant neoplasm of base of tongue: Secondary | ICD-10-CM | POA: Insufficient documentation

## 2020-03-02 DIAGNOSIS — K06010 Localized gingival recession, unspecified: Secondary | ICD-10-CM

## 2020-03-02 DIAGNOSIS — I4891 Unspecified atrial fibrillation: Secondary | ICD-10-CM | POA: Diagnosis not present

## 2020-03-02 DIAGNOSIS — K036 Deposits [accretions] on teeth: Secondary | ICD-10-CM

## 2020-03-02 DIAGNOSIS — I2510 Atherosclerotic heart disease of native coronary artery without angina pectoris: Secondary | ICD-10-CM | POA: Diagnosis not present

## 2020-03-02 DIAGNOSIS — M264 Malocclusion, unspecified: Secondary | ICD-10-CM

## 2020-03-02 DIAGNOSIS — M2630 Unspecified anomaly of tooth position of fully erupted tooth or teeth: Secondary | ICD-10-CM

## 2020-03-02 DIAGNOSIS — M2632 Excessive spacing of fully erupted teeth: Secondary | ICD-10-CM

## 2020-03-02 DIAGNOSIS — K029 Dental caries, unspecified: Secondary | ICD-10-CM

## 2020-03-02 DIAGNOSIS — Z5111 Encounter for antineoplastic chemotherapy: Secondary | ICD-10-CM | POA: Diagnosis not present

## 2020-03-02 DIAGNOSIS — I10 Essential (primary) hypertension: Secondary | ICD-10-CM | POA: Diagnosis not present

## 2020-03-02 DIAGNOSIS — G4700 Insomnia, unspecified: Secondary | ICD-10-CM | POA: Diagnosis not present

## 2020-03-02 DIAGNOSIS — E119 Type 2 diabetes mellitus without complications: Secondary | ICD-10-CM | POA: Diagnosis not present

## 2020-03-02 DIAGNOSIS — C109 Malignant neoplasm of oropharynx, unspecified: Secondary | ICD-10-CM | POA: Diagnosis not present

## 2020-03-02 DIAGNOSIS — N179 Acute kidney failure, unspecified: Secondary | ICD-10-CM | POA: Diagnosis not present

## 2020-03-02 DIAGNOSIS — M270 Developmental disorders of jaws: Secondary | ICD-10-CM

## 2020-03-02 NOTE — Patient Instructions (Signed)
[facility] Dr. Debe Coder B. Dannisha Eckmann Phone: [phone] Fax: [phone]   It was a pleasure seeing you today!  Please refer to the information below regarding your dental visit with [name], and call us should you have any questions or concerns that may come up after you leave.   Thank you for giving [name] the opportunity to provide care for you.  If there is anything we can do for you, please let us know.      RADIATION THERAPY AND INFORMATION REGARDING YOUR TEETH   [x] Xerostomia (Dry Mouth) Your salivary glands may be in the field of radiation.  Radiation may include all or only part of your salivary glands.  This will cause your saliva to dry up, and you will have a dry mouth.  The dry mouth will be for the rest of your life unless your radiation oncologist tells you otherwise.  Your saliva has many functions:  It wets your tongue for speaking.  It coats your teeth and the inside of your mouth for easier movement.  It helps with chewing and swallowing food.  It helps clean away harmful acid and toxic products made by the germs in your mouth, therefore it helps prevent cavities.  It kills some germs in your mouth and helps to prevent gum disease.  It helps to carry flavor to your taste buds.  Once you have lost your saliva you will be at higher risk for tooth decay and gum disease.    What can be done to help improve your mouth when there's not enough saliva: . Your dentist may give a prescription for Salagen.  It will not bring back all of your saliva but may bring back some of it.  Also, your saliva may be thick and ropy or white and foamy. It will not feel like it use to feel. . You will need to swish with water every time your mouth feels dry.  YOU CANNOT suck on any cough drops, mints, lemon drops, candy, vitamin C or any other products.  You cannot use anything other than water to make your mouth feel less dry.  If you want to drink anything  else, you have to drink it all at once and brush afterwards.  Be sure to discuss the details of your diet habits with your dentist or hygienist.  [x] Radiation caries: . This is decay (cavities) that happens very quickly once your mouth is very dry due to radiation therapy.  Normally, cavities take six months to two years to become a problem.  When you have dry mouth, cavities may take as little as eight weeks to cause you a problem.   . Dental check-ups every two months are necessary as long as you have a dry mouth. Radiation caries typically, but not always, start at your gum line where it is hard to see the cavity.  It is therefore also hard to fill these cavities adequately.  This high rate of cavities happens because your mouth no longer has saliva and therefore the acid made by the germs starts the decay process.  Whenever you eat anything the germs in your mouth change the food into acid.  The acid then burns a small hole in your tooth.  This small hole is the beginning of a cavity.  If this is not treated then it will grow bigger and become a cavity.  The way to avoid this hole getting bigger is to use fluoride every evening as prescribed by  your dentist following your radiation.   NOTE:  You have to make sure that your teeth are very clean before you use the fluoride.  This fluoride in turn will strengthen your teeth and prepare them for another day of fighting acid.  If you develop radiation caries many times, the damage is so large that you will have to have all your teeth removed.  This could be a big problem if some of these teeth are in the field of radiation.  Further details of why this could be a big problem will follow.  (See Osteoradionecrosis).  [x] Dysgeusia (Loss of Taste) This happens to varying degrees once you've had radiation therapy to your jaw region.  Many times taste is not completely lost, but becomes limited.  The loss of taste is mostly due to radiation affecting your taste  buds.  However, if you have no saliva in your mouth to carry the flavor to your taste buds, it would be difficult for your taste buds to taste anything.  That is why using water or a prescription for Salagen prior to meals and during meal times may help with some of the taste.  Keep in mind that taste generally returns very slowly over the course of several months or several years after radiation therapy.  Don't give up hope.  [x] Trismus (Limited Jaw Opening) According to your Radiation Oncologist, your TMJ or jaw joints are going to be partially or fully in the field of radiation.  This means that over time the muscles that help you open and close your mouth may get stiff.  This will potentially result in your not being able to open your mouth wide enough or as wide as you can open it now.    Let me give you an example of how slowly this happens and how unaware people are of it:   A gentlemen that had radiation therapy two years ago came back to me complaining that bananas are just too large for him to be able to fit them in between his teeth.  He was not able to open wide enough to bite into a banana.  This happens slowly and over a period of time.  What we do to try and prevent this:   . Your dentist will probably give you a stack of sticks called a trismus exercise device.  This stack will help remind your muscles and your jaw joints to open up to the same distance every day.  Use these sticks every morning when you wake up, or according to the instructions given by your dentist.    . You must use these sticks for at least one to two years after radiation therapy.  The reason for that is because it happens so slowly and keeps going on for about two years after radiation therapy.  Your hospital dentist will help you monitor your mouth opening and make sure that it's not getting smaller after radiation.  [x] Osteoradionecrosis (ORN) . This is a condition where your jaw bone after radiation therapy  becomes very dry.  It has very little blood supply to keep it alive.  If you develop a cavity that turns into an abscess or an infection, then the jaw bone does not have enough blood supply to help fight the infection.  At this point it is very likely that the infection could cause the death of your jaw bone.  When you have dead bone it has to be removed.  Therefore, you might end up  having to have surgery to remove part of your jaw bone, the part of the jaw bone that has been affected.    Marland Kitchen Healing is also a problem if you are to have surgery (like a tooth extraction) in the areas where the bone has had radiation therapy.  If you have surgery, you need more blood supply to heal which is not available.  When blood supply and oxygen are not available, there is a chance for the bone to die. . Occasionally, ORN happens on its own with no obvious reason, but this is quite rare.  We believe that patients who continue to smoke and/or drink alcohol have a higher chance of having this problem. . Once your jaw bone has had radiation therapy, if there are any remaining teeth in that area, it is not recommended to have them pulled unless your dentist or oral surgeon is aware of your history of radiation and believes it is safe.  . The risks for ORN either from infection or spontaneously occurring (with no reason) are life long.

## 2020-03-02 NOTE — Progress Notes (Signed)
DENTAL VISIT OUTPATIENT CONSULTATION  Service Date:   03/02/2020 Referring Provider:                  [name], MD  Patient Name:   Brian [name] Date of Birth:   1955/06/29 Medical Record Number: [id number]    PLAN & RECOMMENDATIONS   >> The patient has multiple teeth that are grossly decayed with chronic periapical infection and periodontal disease.  >> Recommend extractions of all indicated teeth in the operating room under general anesthesia due to patient's dental phobia and extent of treatment that needs to be completed.  Ideally, we would like to complete extractions prior to radiation to decrease the risk of perioperative and postoperative systemic infection and complications.  >> We can schedule the patient for the operating room pending his port drawings and medical team's recommendations.    >> Recommend the patient establish care at a dental office of his choice for routine dental care including replacement of missing teeth, cleanings and periodic exams. >> Discussed in detail all treatment options with the patient and he is agreeable to the plan.   Thank you for consulting with Hospital Dentistry and for the opportunity to participate in this patient's treatment.  Should you have any questions or concerns, please contact the [facility] at [phone].    Consult Note:  03/02/2020    COVID 19 SCREENING: The patient denies symptoms concerning for COVID-19 infection including fever, chills, cough, or newly developed shortness of breath.   HPI: Brian [name] is a very pleasant 65 y.o. 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  Patient presents today for a dental evaluation as part of their Pre-Radiation Therapy work-up.   Dental History: The patient reports last seeing a dentist a few years ago.  He  says that he has one of two teeth that he knows are broken off and one tooth that is loose.  He states he does not have any problems chewing or eating with his teeth, and he denies wearing any partials or dentures.  He currently denies any dental/oral pain or sensitivity.   Patient able to manage oral secretions.  Patient denies dysphagia, odynophagia, dysphonia, and SOB.  Patient denies fever, rigors and malaise.   CHIEF COMPLAINT: "I have a tooth that is broken off on the bottom that is loose," points to tooth #27.   Patient Active Problem List   Diagnosis Date Noted  . 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 Status epilepticus ([place]) 01/18/2013  . 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 status epilepticus, not intractable ([employer]) 01/15/2013  . Hyperlipidemia   . Long term current use of anticoagulant 03/11/2010  . Diabetes mellitus, type II ([place]) 12/20/2009  . Essential hypertension, benign 12/20/2009  . Atrial fibrillation ([place]) 12/20/2009  . CONGESTIVE HEART FAILURE, SYSTOLIC DYSFUNCTION 28/41/3244  . CEREBROVASCULAR ACCIDENT 12/20/2009   Past Medical History:  Diagnosis Date  . Arthritis   . Atrial fibrillation ([place])   . Cardiomyopathy   . Cerebrovascular accident ([place])    2012  . Diabetes mellitus   . Hyperlipidemia   . Hypertension   . Left-sided weakness   . 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 Zigmund Daniel, laser therapy OD  . Seizures ([place])    pt reports this is from low blood sugar   Past Surgical History:  Procedure Laterality Date  .  DIRECT LARYNGOSCOPY N/A 02/10/2020   Procedure: DIRECT LARYNGOSCOPY WITH BIOPSY;  Surgeon: [name], MD;  Location: [facility];  Service: ENT;  Laterality: N/A;  . Ear surgery as a child    . TONSILLECTOMY     Allergies  Allergen Reactions  . Codeine Other (See Comments)    Unknown reaction, severe headach   Current Outpatient Medications  Medication Sig Dispense Refill  . amLODipine (NORVASC) 10 MG tablet Take 1 tablet (10 mg total) by  mouth daily. 90 tablet 1  . atorvastatin (LIPITOR) 20 MG tablet TAKE 1 TABLET (20 MG TOTAL) DAILY BY MOUTH. 90 tablet 3  . carvedilol (COREG) 25 MG tablet TAKE 1 TABLET BY MOUTH TWICE A DAY WITH A MEAL (Patient taking differently: Take 25 mg by mouth 2 (two) times daily with a meal.) 180 tablet 3  . furosemide (LASIX) 20 MG tablet TAKE 1 TABLET (20 MG TOTAL) BY MOUTH DAILY AS NEEDED FOR FLUID. (Patient not taking: No sig reported) 90 tablet 1  . glucose blood (ONE TOUCH ULTRA TEST) test strip CHECK BLOOD SUGAR FOUR TIMES DAILY (e11.9) 400 each 3  . hydrALAZINE (APRESOLINE) 50 MG tablet TAKE 1 TABLET BY MOUTH THREE TIMES A DAY (Patient taking differently: Take 50 mg by mouth 3 (three) times daily.) 270 tablet 1  . Insulin Pen Needle (B-D UF III MINI PEN NEEDLES) 31G X 5 MM MISC USE DAILY WITH INSULIN 100 each 5  . Insulin Syringe-Needle U-100 (INSULIN SYRINGE .3CC/31GX5/16") 31G X 5/16" 0.3 ML MISC Use twice daily to inject insulin. 100 each 10  . LEVEMIR FLEXTOUCH 100 UNIT/ML FlexPen INJECT 70 UNITS UNDER THE SKIN EVERY MORNING AND 40 UNITS AT BEDTIME 15 mL 3  . losartan (COZAAR) 50 MG tablet Take 1 tablet (50 mg total) by mouth daily. 90 tablet 3  . pioglitazone (ACTOS) 15 MG tablet Take 1 tablet (15 mg total) by mouth daily. 90 tablet 2  . tadalafil (CIALIS) 20 MG tablet Take 0.5-1 tablets (10-20 mg total) by mouth every other day as needed for erectile dysfunction. (Patient not taking: No sig reported) 5 tablet 11  . TRULICITY 8.10 FB/5.1WC SOPN INJECT 0.75 MG INTO THE SKIN ONCE A WEEK. (Patient not taking: No sig reported) 4 pen 11  . warfarin (COUMADIN) 10 MG tablet Take 1 tablet (10 mg total) by mouth daily. 90 tablet 3   No current facility-administered medications for this visit.    LABS: Lab Results  Component Value Date   WBC 6.7 02/08/2020   HGB 12.7 (L) 02/08/2020   HCT 39.2 02/08/2020   MCV 86.7 02/08/2020   PLT 220 02/08/2020      Component Value Date/Time   NA 136  02/08/2020 1430   K 3.9 02/08/2020 1430   CL 101 02/08/2020 1430   CO2 24 02/08/2020 1430   GLUCOSE 95 02/08/2020 1430   BUN 19 02/08/2020 1430   CREATININE 1.26 (H) 02/08/2020 1430   CREATININE 1.23 11/30/2019 0915   CALCIUM 9.0 02/08/2020 1430   GFRNONAA >60 02/08/2020 1430   GFRNONAA 61 11/30/2019 0915   GFRAA 70 11/30/2019 0915   Lab Results  Component Value Date   INR 1.0 02/08/2020   INR 2.2 (H) 01/18/2020   INR 3.0 (H) 11/30/2019   No results found for: PTT  Social History   Socioeconomic History  . Marital status: Divorced    Spouse name: Not on file  . Number of children: 2  . Years of education: Not on file  .  Highest education level: Not on file  Occupational History  . Occupation: Disabled  Tobacco Use  . Smoking status: Never Smoker  . Smokeless tobacco: Never Used  Vaping Use  . Vaping Use: Never used  Substance and Sexual Activity  . Alcohol use: Yes    Alcohol/week: 0.0 standard drinks    Comment: seldom  . Drug use: Yes    Frequency: 7.0 times per week    Comment: marijauna for arthritis  . Sexual activity: Not Currently  Other Topics Concern  . Not on file  Social History Narrative   He has children.   Social Determinants of Health   Financial Resource Strain: Not on file  Food Insecurity: Not on file  Transportation Needs: Not on file  Physical Activity: Not on file  Stress: Not on file  Social Connections: Not on file  Intimate Partner Violence: Not on file   Family History  Problem Relation Age of Onset  . Hyperlipidemia Father   . Hypertension Father   . Coronary artery disease Neg Hx        no early CAD.  Marland Kitchen Colon cancer Neg Hx   . Stomach cancer Neg Hx   . Rectal cancer Neg Hx   . Esophageal cancer Neg Hx     REVIEW OF SYSTEMS: Reviewed with the patient as per HPI. PSYCH: (+) Dental phobia   VITAL SIGNS: BP (!) 212/99 (BP Location: Right Arm)   Pulse 75   Temp 98.4 F (36.9 C)    PHYSICAL EXAMINATION: GENERAL:  Well-developed, comfortable and in no apparent distress. NEUROLOGICAL: Alert and oriented to person, place and time. EXTRAORAL:  Facial symmetry present without any edema or erythema.  No swelling.  TMJ asymptomatic without clicks or crepitations. Bilateral neck enlargement, (+) Lymphadenopathy. Maximum interincisal opening: 45 mm INTRAORAL: Soft tissues appear well-perfused and mucous membranes moist.  FOM and vestibules soft and not raised. Oral cavity without mass or lesion. No signs of acute infection, parulis, sinus tract, edema or erythema evident upon exam. High palatal vault, small mandibular tori on the Left side.  DENTAL EXAMINATION: DENTITION: Overall poor remaining dentition.  Missing teeth, few existing restorations, several retained root tips, generalized decay in all 4 quadrants.  #8 and #10 rotated mesially, #18 & #31 mesially drifted and non-functional.  Large diastema between #8 and #10 d/t rotation of teeth and missing #9.  The patient is maintaining poor oral hygiene. PERIODONTAL: Inflamed, erythematous gingival tissue. Generalized moderate calculus accumulation (sub- and supra-gingival). Moderate plaque accumulation.  #23- #26 Class II mobility. Generalized gingival recession likely due to attachment loss from chronic periodontal disease. DENTAL CARIES/DEFECTIVE RESTORATIONS: Rampant decay. #7, #12, #19, #26 and #27 retained root tips. PROSTHODONTIC: Patient denies wearing partials or dentures. OCCLUSION: Unable to assess molar occlusion. Slightly collapsed VDO. Occlusion shifted primarily to the anterior teeth, unstable.  RADIOGRAPHIC EXAMINATION PAN Full Mouth Series exposed and interpreted: >> Condyles seated bilaterally in fossas.  No evidence of abnormal pathology.  All visualized osseous structures appear WNL. >> Generalized moderate to severe horizontal bone loss consistent with moderate to severe periodontitis.  Radiographic calculus accumulation evident. Missing teeth,  generalized decay in all 4 quadrants, existing restorations. #7, #12, #19, #26 and #27 retained root tips with periapical radiolucencies.  #18, #19 and #31 mesially drifted and non-functional teeth. #8 periapical radiolucency. #8 and #10 deep decay approximating the pulp.     ASSESSMENT 1. SCC of the Oropharynx 2. Pre-Radiation Therapy 3. CHF 4. Atrial fibrillation 5. HTN 6.  Type 2 Diabetes Mellitus 10. Hyperlipidemia 11. Long-term Anticoagulation on Coumadin 12. Missing teeth 13. Caries 14. Chronic apical periodontitis 15. Chronic periodontitis 16. Accretions on teeth 17. Gingival recession 18. Retained dental root 19. Loose teeth 20. Anomalies of tooth position 21. Diastema 22. 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 Postoperative bleeding risk   PLAN/RECOMMENDATIONS  . I discussed the risks, benefits, and complications of various treatment options with the patient in relationship to his medical and dental conditions.  We discussed the risk for developing osteoradionecrosis of the jaw following radiation therapy if infected teeth or teeth that are in poor condition are not extracted before radiation.  We discussed his overall oral health, and how most to all of his teeth are severely infected by gum disease and/or large cavities and multiple extractions (likely full mouth extractions) would likely be the best option for him, along with having complete upper and lower dentures made down the line.  I also explained to the patient the multiple side effects of radiation therapy to the head and neck including dry mouth, limited opening, loss of taste, ORN and radiation caries. . We discussed various treatment options to include no treatment, multiple extractions with alveoloplasty, pre-prosthetic surgery as indicated, periodontal therapy, implant therapy, and replacement of missing teeth as indicated.  . The patient verbalized understanding of all options, and currently  wishes to proceed with extractions of all indicated teeth (what is recommended prior to starting radiation if necessary) in the operating room under general anesthesia.  I explained to him that I will need to speak with Dr. Isidore Moos about his port drawings and when he is planning on moving forward with treatment prior to scheduling him for extractions.  He is on Coumadin, so we will need to hold this prior for 5 days and I will need to speak with his primary care doctor about bridging with Lovenox or any other recommendations as well.  . Discussion of findings with medical team and coordination of future medical and dental care as needed.   The patient tolerated today's visit well.  All questions and concerns were addressed and the patient departed in stable condition.   I spent in excess of 120 minutes during the conduct of this consultation and >50% of this time involved direct face-to-face encounter for counseling and/or coordination of the patient's care.   Northwood Benson [place], DMD

## 2020-03-02 NOTE — Progress Notes (Signed)
   Covid-19 Vaccination Clinic  Name:  Brian [name]    MRN: [id number] DOB: Jun 24, 1955  03/02/2020  Brian [name] was observed post Covid-19 immunization for 15 minutes without incident. He was provided with Vaccine Information Sheet and instruction to access the V-Safe system.   Brian [name] was instructed to call 911 with any severe reactions post vaccine: Marland Kitchen Difficulty breathing  . Swelling of face and throat  . A fast heartbeat  . A bad rash all over body  . Dizziness and weakness

## 2020-03-02 NOTE — Progress Notes (Signed)
[place] Psychosocial Distress Screening Clinical Social Work  Clinical Social Work was referred by distress screening protocol.  The patient scored a 5 on the Psychosocial Distress Thermometer which indicates moderate distress. Clinical Social Worker contacted patient by phone to assess for distress and other psychosocial needs. Called patient at home and mobile numbers, left generic VM on both as VM was not personally identified.  Will try again to reach him tomorrow.    ON[facility] DISTRESS SCREENING 03/01/2020  Screening Type Initial Screening  Distress experienced in past week (1-10) 5  Emotional problem type Depression;Adjusting to illness  Information Concerns Type Lack of info about diagnosis;Lack of info about treatment;Lack of info about complementary therapy choices  Physical Problem type Sexual problems  Physician notified of physical symptoms No  Referral to clinical psychology No  Referral to clinical social work Yes  Referral to dietition Yes  Referral to financial advocate Yes  Referral to support programs No  Referral to palliative care No    Clinical Social Worker follow up needed: Yes.    If yes, follow up plan:  Call again  [name], [name]

## 2020-03-02 NOTE — Progress Notes (Signed)
Dental Form with Estimates of Radiation Dose      Diagnosis:    ICD-10-CM   1. 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

## 2020-03-03 ENCOUNTER — Encounter: Payer: Self-pay | Admitting: Hematology and Oncology

## 2020-03-03 ENCOUNTER — Encounter: Payer: Self-pay | Admitting: General Practice

## 2020-03-03 ENCOUNTER — Other Ambulatory Visit: Payer: Self-pay

## 2020-03-03 ENCOUNTER — Telehealth: Payer: Self-pay | Admitting: *Deleted

## 2020-03-03 ENCOUNTER — Ambulatory Visit (INDEPENDENT_AMBULATORY_CARE_PROVIDER_SITE_OTHER): Payer: Medicare Other | Admitting: Family Medicine

## 2020-03-03 ENCOUNTER — Inpatient Hospital Stay: Payer: Medicare Other

## 2020-03-03 ENCOUNTER — Inpatient Hospital Stay: Payer: Medicare Other | Admitting: Hematology and Oncology

## 2020-03-03 VITALS — BP 184/84 | HR 76 | Temp 97.6°F | Resp 18 | Ht 69.0 in | Wt 214.0 lb

## 2020-03-03 VITALS — BP 168/98 | HR 86 | Temp 98.2°F | Resp 14 | Ht 69.0 in | Wt 212.0 lb

## 2020-03-03 DIAGNOSIS — G4700 Insomnia, unspecified: Secondary | ICD-10-CM | POA: Diagnosis not present

## 2020-03-03 DIAGNOSIS — I10 Essential (primary) hypertension: Secondary | ICD-10-CM | POA: Diagnosis not present

## 2020-03-03 DIAGNOSIS — R5381 Other malaise: Secondary | ICD-10-CM

## 2020-03-03 DIAGNOSIS — C01 Malignant neoplasm of base of tongue: Secondary | ICD-10-CM

## 2020-03-03 DIAGNOSIS — C109 Malignant neoplasm of oropharynx, unspecified: Secondary | ICD-10-CM

## 2020-03-03 DIAGNOSIS — N179 Acute kidney failure, unspecified: Secondary | ICD-10-CM | POA: Diagnosis not present

## 2020-03-03 DIAGNOSIS — I2510 Atherosclerotic heart disease of native coronary artery without angina pectoris: Secondary | ICD-10-CM | POA: Diagnosis not present

## 2020-03-03 DIAGNOSIS — E119 Type 2 diabetes mellitus without complications: Secondary | ICD-10-CM | POA: Diagnosis not present

## 2020-03-03 DIAGNOSIS — R5383 Other fatigue: Secondary | ICD-10-CM

## 2020-03-03 DIAGNOSIS — I4891 Unspecified atrial fibrillation: Secondary | ICD-10-CM | POA: Diagnosis not present

## 2020-03-03 DIAGNOSIS — Z5111 Encounter for antineoplastic chemotherapy: Secondary | ICD-10-CM | POA: Diagnosis not present

## 2020-03-03 DIAGNOSIS — Z23 Encounter for immunization: Secondary | ICD-10-CM | POA: Diagnosis not present

## 2020-03-03 MED ORDER — CHLORTHALIDONE 25 MG PO TABS: 25.0000 mg | ORAL_TABLET | Freq: Every day | ORAL | 1 refills | Status: DC

## 2020-03-03 NOTE — Progress Notes (Signed)
Subjective:    Patient ID: Brian [name], male    DOB: 1955-06-22, 65 y.o.   MRN: [id number]  Since I last saw the patient, he was diagnosed with squamous cell carcinoma.  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  He met with radiation oncologist recently.  At their office his systolic blood pressure was greater than 200.  Therefore I had him come in today urgently to be reassessed.  In his left arm his blood pressure was 190/100.  In his right arm it was 170/98.  He is consistently taking losartan 50 mg a day, amlodipine 10 mg a day, carvedilol 25 mg twice a day, hydralazine 50 mg 3 times a day.  He states that he is not missing any of his medication.  He has not been taking losartan.  He is also not taking Trulicity because it was too expensive. Past Surgical History:  Procedure Laterality Date  . DIRECT LARYNGOSCOPY N/A 02/10/2020   Procedure: DIRECT LARYNGOSCOPY WITH BIOPSY;  Surgeon: [name], MD;  Location: [facility];  Service: ENT;  Laterality: N/A;  . Ear surgery as a child    . TONSILLECTOMY     Current Outpatient Medications on File Prior to Visit  Medication Sig Dispense Refill  . amLODipine (NORVASC) 10 MG tablet Take 1 tablet (10 mg total) by mouth daily. 90 tablet 1  . atorvastatin (LIPITOR) 20 MG tablet TAKE 1 TABLET (20 MG TOTAL) DAILY BY MOUTH. 90 tablet 3  . carvedilol (COREG) 25 MG tablet TAKE 1 TABLET BY MOUTH TWICE A DAY WITH A MEAL (Patient taking differently: Take 25 mg by mouth 2 (two) times daily with a meal.) 180 tablet 3  . glucose blood (ONE TOUCH ULTRA TEST) test strip CHECK BLOOD SUGAR FOUR TIMES DAILY (e11.9) 400 each 3  . hydrALAZINE (APRESOLINE) 50 MG tablet TAKE 1 TABLET BY MOUTH THREE TIMES A DAY (Patient taking differently: Take 50 mg by mouth 3 (three) times daily.) 270 tablet 1  . Insulin Pen Needle (B-D UF III MINI PEN NEEDLES) 31G X 5 MM MISC USE DAILY WITH INSULIN 100 each 5  . Insulin  Syringe-Needle U-100 (INSULIN SYRINGE .3CC/31GX5/16") 31G X 5/16" 0.3 ML MISC Use twice daily to inject insulin. 100 each 10  . LEVEMIR FLEXTOUCH 100 UNIT/ML FlexPen INJECT 70 UNITS UNDER THE SKIN EVERY MORNING AND 40 UNITS AT BEDTIME 15 mL 3  . losartan (COZAAR) 50 MG tablet Take 1 tablet (50 mg total) by mouth daily. 90 tablet 3  . pioglitazone (ACTOS) 15 MG tablet Take 1 tablet (15 mg total) by mouth daily. 90 tablet 2  . warfarin (COUMADIN) 10 MG tablet Take 1 tablet (10 mg total) by mouth daily. 90 tablet 3  . furosemide (LASIX) 20 MG tablet TAKE 1 TABLET (20 MG TOTAL) BY MOUTH DAILY AS NEEDED FOR FLUID. (Patient not taking: Reported on 03/03/2020) 90 tablet 1   No current facility-administered medications on file prior to visit.   Allergies  Allergen Reactions  . Codeine Other (See Comments)    Unknown reaction, severe headach   Social History   Socioeconomic History  . Marital status: Divorced    Spouse name: Not on file  . Number of children: 2  . Years of education: Not on file  . Highest education level: Not on file  Occupational History  . Occupation: Disabled  Tobacco Use  . Smoking status:  Never Smoker  . Smokeless tobacco: Never Used  Vaping Use  . Vaping Use: Never used  Substance and Sexual Activity  . Alcohol use: Yes    Alcohol/week: 0.0 standard drinks    Comment: seldom  . Drug use: Yes    Frequency: 7.0 times per week    Comment: marijauna for arthritis  . Sexual activity: Not Currently  Other Topics Concern  . Not on file  Social History Narrative   He has children.   Social Determinants of Health   Financial Resource Strain: Not on file  Food Insecurity: Not on file  Transportation Needs: Not on file  Physical Activity: Not on file  Stress: Not on file  Social Connections: Not on file  Intimate Partner Violence: Not on file    Review of Systems  All other systems reviewed and are negative.      Objective:   Physical Exam Vitals  reviewed.  HENT:     Head:     Salivary Glands: Left salivary gland is diffusely enlarged and tender.   Neck:   Cardiovascular:     Rate and Rhythm: Normal rate and regular rhythm.     Heart sounds: Normal heart sounds.  Pulmonary:     Effort: Pulmonary effort is normal. No respiratory distress.     Breath sounds: Normal breath sounds. No wheezing or rales.  Chest:     Chest wall: No tenderness.  Abdominal:     General: Bowel sounds are normal. There is no distension.     Palpations: Abdomen is soft.     Tenderness: There is no abdominal tenderness. There is no rebound.  Musculoskeletal:     Cervical back: Muscular tenderness present.     Right lower leg: No edema.     Left lower leg: No edema.  Lymphadenopathy:     Cervical: Cervical adenopathy present.     Right cervical: Superficial cervical adenopathy present.     Left cervical: Superficial cervical adenopathy present.           Assessment & Plan:  Essential hypertension, benign  Blood pressure is dangerously high.  There is no evidence of endorgan damage.  He denies any chest pain shortness of breath or dyspnea on exertion.  He denies any strokelike symptoms.  He denies any oliguria or hematuria.  Our lab person is out sick today with COVID therefore I cannot check renal function or lab work.  I will start the patient on hydrochlorothiazide 25 mg a day and recheck blood pressure next week.  He is overdue to recheck an INR at that visit.  We will need to adjust his diabetic medication based on his sugars as he starts radiation therapy for the cancer in his neck.  I anticipate that his oral intake will decrease and this will likely affect his sugars and may cause him to start to drop.

## 2020-03-03 NOTE — Progress Notes (Signed)
[facility] NOTE  Patient Care Team: Brian Frizzle, [name] as PCP - General (Family Medicine)  CHIEF COMPLAINTS/PURPOSE OF CONSULTATION:  SCC of oropharynx.  ASSESSMENT & PLAN:  No problem-specific Assessment & Plan notes found for this encounter.  No orders of the defined types were placed in this encounter.  This is a 65 year old male patient with past medical history significant for diabetes, hypertension, coronary artery disease, stroke, atrial fibrillation on anticoagulation, obesity referred to medical oncology for evaluation and recommendations regarding new diagnosis of squamous cell carcinoma of oropharynx. 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 I have reviewed the current recommendations for stage III HPV positive oropharyngeal cancer with the patient today which is primarily concurrent chemoradiation. Given his comorbidities, age, I think he is a good candidate for weekly cisplatin along with radiation.  We have discussed about adverse side effects of cisplatin including but not limited to fatigue, nausea, vomiting, cytopenias, increased risk of infections, permanent hearing loss, nephrotoxicity etc. We will conduct a baseline audiology evaluation given he has hearing loss. Anticipated start date for treatment is January 24.  He will return for weekly appointments while on concurrent chemoradiation. We have discussed the intent of treatment which is curative. We have discussed about port placement, sent a message to Dr. Jenna Luo if it is safe to discontinue his anticoagulation for the purpose of port placement.  We also discussed about PEG tube but he would like to wait on it. Chemo education will be scheduled. Thank you for consulting [name] in the care of this patient.  Please not hesitate to contact us with any additional questions or concerns.  I have discussed the small risk of  thrombo embolic events when he is off of anticoagulation for port placement. I will also await his PCP's recommendations.  Brian [name]   HISTORY OF PRESENTING ILLNESS:   Brian [name] is a 65 y.o. male who presented two-week history of facial swelling in late November/early December 2021. Subsequently, the patient saw Dr. Benjamine Mola, who performed a direct laryngoscopy with biopsy on the date of 02/10/2020. Final pathology revealed: squamous cell carcinoma, P16 positive  He had the following imaging performed  1. Soft tissue neck CT scan performed on 01/28/2020 revealing right glossotonsillar malignancy with bulky bilateral nodal disease. Nodal masses sowed definite extracapsular spread. There was also noted to be incidental advanced cervical spine degeneration with cord compression. 2. PET scan performed on 02/26/2020 revealing a hypermetabolic lesion at the right base of the tongue and right tonsil, consistent with oropharyngeal carcinoma. Carcinoma appeared to be confined to the mucosal surface. There were also noted to bulky bilateral hypermetabolic level II metastatic lymph nodes and smaller inferior hypermetabolic level II metastatic lymph nodes. There was no evidence of metastatic disease in the chest, abdomen, or pelvis. The two small right middle lobe pulmonary nodules present on CT from 2012 were consistent with benign etiology.  He is here to discuss role of chemoradiation. He denies any complaints except for face swelling. No difficulty swallowing, pain, difficulty with speech. He has noted some ear pain started recently on the left. He has a fib at baseline and has used anticoagulation, continues on coumadin He had a stroke about 9 yrs ago.  Review of systems otherwise quite unremarkable, he overall feels well although he is worried about the treatment.  REVIEW OF SYSTEMS:   Constitutional: Denies fevers, chills or abnormal night sweats Eyes: Denies  blurriness of vision,  double vision or watery eyes Ears, nose, mouth, throat, and face: Denies mucositis or sore throat.  He is able to swallow everything Respiratory: Denies cough, dyspnea or wheezes Cardiovascular: Denies palpitation, chest discomfort or lower extremity swelling Gastrointestinal:  Denies nausea, heartburn or change in bowel habits Skin: Denies abnormal skin rashes Lymphatics: Complains of masses in the neck  neurological:Denies numbness, tingling or new weaknesses Behavioral/Psych: Mood is stable, no new changes  All other systems were reviewed with the patient and are negative.  MEDICAL HISTORY:  Past Medical History:  Diagnosis Date  . Arthritis   . Atrial fibrillation ([place])   . Cardiomyopathy   . Cerebrovascular accident ([place])    2012  . Diabetes mellitus   . Hyperlipidemia   . Hypertension   . Left-sided weakness   . 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 Zigmund Daniel, laser therapy OD  . Seizures ([place])    pt reports this is from low blood sugar    SURGICAL HISTORY: Past Surgical History:  Procedure Laterality Date  . DIRECT LARYNGOSCOPY N/A 02/10/2020   Procedure: DIRECT LARYNGOSCOPY WITH BIOPSY;  Surgeon: [name], [name];  Location: [facility];  Service: ENT;  Laterality: N/A;  . Ear surgery as a child    . TONSILLECTOMY      SOCIAL HISTORY: Social History   Socioeconomic History  . Marital status: Divorced    Spouse name: Not on file  . Number of children: 2  . Years of education: Not on file  . Highest education level: Not on file  Occupational History  . Occupation: Disabled  Tobacco Use  . Smoking status: Never Smoker  . Smokeless tobacco: Never Used  Vaping Use  . Vaping Use: Never used  Substance and Sexual Activity  . Alcohol use: Yes    Alcohol/week: 0.0 standard drinks    Comment: seldom  . Drug use: Yes    Frequency: 7.0 times per week    Comment: marijauna for arthritis  . Sexual activity: Not Currently  Other Topics Concern  . Not on file   Social History Narrative   He has children.   Social Determinants of Health   Financial Resource Strain: Not on file  Food Insecurity: Not on file  Transportation Needs: Not on file  Physical Activity: Not on file  Stress: Not on file  Social Connections: Not on file  Intimate Partner Violence: Not on file    FAMILY HISTORY: Family History  Problem Relation Age of Onset  . Hyperlipidemia Father   . Hypertension Father   . Coronary artery disease Neg Hx        no early CAD.  Marland Kitchen Colon cancer Neg Hx   . Stomach cancer Neg Hx   . Rectal cancer Neg Hx   . Esophageal cancer Neg Hx     ALLERGIES:  is allergic to codeine.  MEDICATIONS:  Current Outpatient Medications  Medication Sig Dispense Refill  . amLODipine (NORVASC) 10 MG tablet Take 1 tablet (10 mg total) by mouth daily. 90 tablet 1  . atorvastatin (LIPITOR) 20 MG tablet TAKE 1 TABLET (20 MG TOTAL) DAILY BY MOUTH. 90 tablet 3  . carvedilol (COREG) 25 MG tablet TAKE 1 TABLET BY MOUTH TWICE A DAY WITH A MEAL (Patient taking differently: Take 25 mg by mouth 2 (two) times daily with a meal.) 180 tablet 3  . chlorthalidone (HYGROTON) 25 MG tablet Take 1 tablet (25 mg total) by mouth daily. 30 tablet  1  . glucose blood (ONE TOUCH ULTRA TEST) test strip CHECK BLOOD SUGAR FOUR TIMES DAILY (e11.9) 400 each 3  . hydrALAZINE (APRESOLINE) 50 MG tablet TAKE 1 TABLET BY MOUTH THREE TIMES A DAY (Patient taking differently: Take 50 mg by mouth 3 (three) times daily.) 270 tablet 1  . Insulin Pen Needle (B-D UF III MINI PEN NEEDLES) 31G X 5 MM MISC USE DAILY WITH INSULIN 100 each 5  . Insulin Syringe-Needle U-100 (INSULIN SYRINGE .3CC/31GX5/16") 31G X 5/16" 0.3 ML MISC Use twice daily to inject insulin. 100 each 10  . LEVEMIR FLEXTOUCH 100 UNIT/ML FlexPen INJECT 70 UNITS UNDER THE SKIN EVERY MORNING AND 40 UNITS AT BEDTIME 15 mL 3  . losartan (COZAAR) 50 MG tablet Take 1 tablet (50 mg total) by mouth daily. 90 tablet 3  . pioglitazone (ACTOS)  15 MG tablet Take 1 tablet (15 mg total) by mouth daily. 90 tablet 2  . warfarin (COUMADIN) 10 MG tablet Take 1 tablet (10 mg total) by mouth daily. 90 tablet 3  . furosemide (LASIX) 20 MG tablet TAKE 1 TABLET (20 MG TOTAL) BY MOUTH DAILY AS NEEDED FOR FLUID. (Patient not taking: Reported on 03/03/2020) 90 tablet 1   No current facility-administered medications for this visit.     PHYSICAL EXAMINATION: ECOG PERFORMANCE STATUS: 0 - Asymptomatic  Vitals:   03/03/20 1253  BP: (!) 184/84  Pulse: 76  Resp: 18  Temp: 97.6 F (36.4 C)  SpO2: 99%   Filed Weights   03/03/20 1253  Weight: 214 lb (97.1 kg)    GENERAL:alert, no distress and comfortable SKIN: skin color, texture, turgor are normal, no rashes or significant lesions EYES: normal, conjunctiva are pink and non-injected, sclera clear OROPHARYNX: Hard to visualize tonsillar mass, he had a strong gag NECK: supple, thyroid normal size, non-tender, without nodularity LYMPH: Large palpable lymph nodes bilateral cervical region.  No supraclavicular lymphadenopathy LUNGS: clear to auscultation and percussion with normal breathing effort HEART: regular rate & rhythm and no murmurs and no lower extremity edema ABDOMEN:abdomen soft, non-tender and normal bowel sounds Musculoskeletal:no cyanosis of digits and no clubbing  PSYCH: alert & oriented x 3 with fluent speech NEURO: no focal motor/sensory deficits  LABORATORY DATA:  I have reviewed the data as listed Lab Results  Component Value Date   WBC 6.7 02/08/2020   HGB 12.7 (L) 02/08/2020   HCT 39.2 02/08/2020   MCV 86.7 02/08/2020   PLT 220 02/08/2020     Chemistry      Component Value Date/Time   NA 136 02/08/2020 1430   K 3.9 02/08/2020 1430   CL 101 02/08/2020 1430   CO2 24 02/08/2020 1430   BUN 19 02/08/2020 1430   CREATININE 1.26 (H) 02/08/2020 1430   CREATININE 1.23 11/30/2019 0915      Component Value Date/Time   CALCIUM 9.0 02/08/2020 1430   ALKPHOS 62  09/25/2016 0914   AST 11 11/30/2019 0915   ALT 14 11/30/2019 0915   BILITOT 0.5 11/30/2019 0915       RADIOGRAPHIC STUDIES: I have personally reviewed the radiological images as listed and agreed with the findings in the report. NM PET Image Initial (PI) Skull Base To Thigh  Result Date: 02/26/2020 CLINICAL DATA:  Initial treatment strategy for oropharyngeal carcinoma. EXAM: NUCLEAR MEDICINE PET SKULL BASE TO THIGH TECHNIQUE: 10.4 mCi F-18 FDG was injected intravenously. Full-ring PET imaging was performed from the skull base to thigh after the radiotracer. CT data was obtained and  used for attenuation correction and anatomic localization. Fasting blood glucose: 4 170 mg/dl COMPARISON:  neck CT 01/28/2020 CT 09/20/2010 FINDINGS: Mediastinal blood pool activity: SUV max 3.1 Liver activity: SUV max 4.2 NECK: Intense hypermetabolic activity localizes to the RIGHT base of tongue and extends into the RIGHT lingual tonsil. Activity is confined to the mucosal layer. Activity is intense with SUV max equal 10.3 (image 48). Bilateral large bulky intensely hypermetabolic level II lymph nodes (SUV max equal 14). Nodal mass on the RIGHT measures 4.4 cm and on the LEFT 4.8 cm. Inferior to the dominant masses are there several additional small hypermetabolic level II lymph nodes on the LEFT and RIGHT at the level the hyoid. For example 8 mm LEFT inferior level II node (image 51) with SUV max equal 8.9. No more inferior hypermetabolic nodes are identified. No supraclavicular hypermetabolic nodes. Incidental CT findings: Remote infarction noted in the RIGHT parietal lobe. CHEST: No hypermetabolic mediastinal or hilar nodes. Two small pulmonary nodules in the inferior RIGHT middle lobe measure 3 mm and 4 mm on image 102 and 104 series 4. The small pulmonary nodules do not have radiotracer activity. Additionally nodules are present on CT from 09/20/2010. Incidental CT findings: Coronary artery calcification and aortic  atherosclerotic calcification. ABDOMEN/PELVIS: No abnormal hypermetabolic activity within the liver, pancreas, adrenal glands, or spleen. No hypermetabolic lymph nodes in the abdomen or pelvis. Incidental CT findings: none SKELETON: No focal hypermetabolic activity to suggest skeletal metastasis. Incidental CT findings: Degenerative osteophytosis of the spine. IMPRESSION: 1. Hypermetabolic lesion at the RIGHT base of tongue and RIGHT tonsil consistent with oropharyngeal carcinoma. Carcinoma appears confined to the mucosal surface. 2. Bulky bilateral hypermetabolic level II metastatic lymph nodes. Smaller inferior hypermetabolic level II metastatic lymph nodes. 3. No evidence of metastatic disease in the chest, abdomen, or pelvis. 4. Two small RIGHT middle lobe pulmonary nodules are present on CT from 2012 consistent with benign etiology Electronically Signed   By: Suzy Bouchard M.D.   On: 02/26/2020 10:36   I reviewed the image myself.  Noted hypermetabolic lesions at the base of tongue on the right side and right tonsil as well as the bilateral bulky lymphadenopathy.  02/10/2020  SURGICAL PATHOLOGY    Reason for Addendum #1: Immunohistochemistry results   Clinical History: throat cancer (cm)    FINAL MICROSCOPIC DIAGNOSIS:   A. OROPHARYNGEAL, RIGHT, BIOPSY:  - Squamous cell carcinoma  - See comment   COMMENT:   P16 immunohistochemistry is pending and will be reported in an addendum.  Dr. Tresa Moore reviewed the case and agrees with the above diagnosis. Dr.  Benjamine Mola was notified of these results on February 11, 2020.   GROSS DESCRIPTION:   The specimen is received in saline and consists of a 1.6 x 1.5 x 0.3 cm  aggregate of tan-pink soft tissue. The specimen is entirely submitted  in 1 cassette. Craig Staggers 02/10/2020)    Final Diagnosis performed by Thressa Sheller, [name].  Electronically signed  02/11/2020  Technical and / or Professional components performed at [facility]. [facility], [place] [address] [place], [place] [address].  Immunohistochemistry Technical component (if applicable) was performed  at [facility]. [address] [place],  [place], [place] [address].  IMMUNOHISTOCHEMISTRY DISCLAIMER (if applicable):  Some of these immunohistochemical stains may have been developed and the  performance characteristics determine by [facility]. Some  may not have been cleared or approved by the U.S. Food and Drug  Administration. The FDA  has determined that such clearance or approval  is not necessary. This test is used for clinical purposes. It should not  be regarded as investigational or for research. This laboratory is  certified under the [place]  (CLIA-88) as qualified to perform high complexity clinical laboratory  testing. The controls stained appropriately.   ADDENDUM:   P16 immunohistochemistry is POSITIVE.   All questions were answered. The patient knows to call the clinic with any problems, questions or concerns. I spent 60 minutes in the care of this patient including H and P, review of records, counseling and coordination of care.     Brian Pike, [name] 03/03/2020 1:31 PM

## 2020-03-03 NOTE — Progress Notes (Signed)
Oncology Nurse Navigator Documentation  Met with patient during initial consult with Dr. Chryl Heck.  . Further introduced myself as his Navigator, explained my role as a member of the Care Team. . Assisted with post-consult appt scheduling. . I discussed that [name] would not be performing teeth extractions after discussing his situation with Dr. Isidore Moos.  . I informed him of his appointment for CT simulation/radiation planning at 8:45 tomorrow morning. Marland Kitchen He verbalized understanding of information provided. . I encouraged them to call with questions/concerns moving forward.  Harlow Asa, RN, BSN, OCN Head & Neck Oncology Nurse [place] [facility] [place] [phone]

## 2020-03-03 NOTE — Telephone Encounter (Signed)
Appointment scheduled.

## 2020-03-03 NOTE — Progress Notes (Signed)
START ON PATHWAY REGIMEN - Head and Neck     A cycle is every 7 days:     Cisplatin   **Always confirm dose/schedule in your pharmacy ordering system**  Patient Characteristics: Oropharynx, HPV Positive, Preoperative or Nonsurgical Candidate (Clinical Staging), cT0-4, cN1-3 or cT3-4, cN0 Disease Classification: Oropharynx HPV Status: Positive (+) Therapeutic Status: Preoperative or Nonsurgical Candidate (Clinical Staging) AJCC T Category: cT2 AJCC 8 Stage Grouping: III AJCC N Category: cN3 AJCC M Category: cM0 Intent of Therapy: Curative Intent, Discussed with Patient

## 2020-03-03 NOTE — Progress Notes (Signed)
Brian [name]  Initial Assessment   Brian [name] is a 65 y.o. year old male contacted by phone.. Clinical Social [name] was referred by Distress screen and referral for assessment of psychosocial needs.   SDOH (Social Determinants of Health) assessments performed: Yes   Distress Screen completed: Yes ONCBCN DISTRESS SCREENING 03/01/2020  Screening Type Initial Screening  Distress experienced in past week (1-10) 5  Emotional problem type Depression;Adjusting to illness  Information Concerns Type Lack of info about diagnosis;Lack of info about treatment;Lack of info about complementary therapy choices  Physical Problem type Sexual problems  Physician notified of physical symptoms No  Referral to clinical psychology No  Referral to clinical social [name] Yes  Referral to dietition Yes  Referral to financial advocate Yes  Referral to support programs No  Referral to palliative care No      Family/Social Information:  . Housing Arrangement: patient lives with mother, he helps care for her . Family members/support persons in your life? 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  He lives w mother and helps care for her . Transportation concerns: Plans to drive himself as much as possible and has people who will help drive him if he cannot . 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   Income source: retirement funds and part time job . Financial concerns: Yes, due to illness and/or loss of [name] during treatment o Type of concern: Medical bills . Food access concerns: No . Religious or spiritual practice: None . Medication Concerns: Has [employer] Ad[employer] plan, also aware that he can apply for Medicaid w eligibility depending on his income and medical bills . Services Currently in place:  none  Coping/ Adjustment to diagnosis: . Patient understands treatment  plan and what happens next? Newly diagnosed wi oropharynx cancer - friend noticed lump on his jaw/neck in Nov 2021.  "Ive been through 3 scans now and all that stuff."  Starts radiation on 1/24.  "I feel good as long as it clears me up, want to get rid of all this stuff."   . Concerns about diagnosis and/or treatment: "I know its curable, so Ill do exactly what they tell me to do" . Patient reported stressors: Finances, Adjusting to my illness and "money" how will I pay for everything when he may not be able to [name] his part time job . Hopes and priorities: "get rid of this" . Patient enjoys camping, working on hot rod cars, outdoors . Current coping skills/ strengths: Active sense of humor, Capable of independent living, Communication skills, Special hobby/interest and Supportive family/friends    SUMMARY: Current SDOH Barriers:  . none   Interventions: . Discussed common feeling and emotions when being diagnosed with cancer, and the importance of support during treatment . Informed patient of the support team roles and support services at [facility] . Provided CSW contact information and encouraged patient to call with any questions or concerns . Provided patient with information about Patient and [facility], will also see in [facility] if scheduled   Follow Up Plan: Patient will contact CSW with any support or resource needs Patient verbalizes understanding of plan: Yes    [name] , [place], [place] Worker Phone:  [phone]

## 2020-03-03 NOTE — Telephone Encounter (Signed)
-----   Message from Brian Frizzle, MD sent at 03/03/2020  6:43 AM EST ----- Regarding: RE: Elevated BP [name], please ask patient to swing by clinic so we can check his bp and adjust his medications if needed. Tom ----- Message ----- From: Zola Button, RN Sent: 03/02/2020  12:05 PM EST To: [name], MD, Ernst Spell, RN, # Subject: Elevated BP                                    Dr. Dennard Schaumann,   [name] is a new patient here at the [facility]. He came yesterday for his radiation consult and we noticed his BP was significantly elevated. He stated that he's taking all his BP medications as directed, and said he just felt anxious and thought it was because he had walked far away from where he parked in the parking lot. But when I rechecked his BP again after his visit with Dr. Isidore Moos it was still very elevated. He had no other symptoms (headaches, dizziness, shortness of breath) and was very resistant to going to ED because he feels like it's just him being anxious having to come to so many medical appointments (and to be honest the last few times we've sent patients to the ED they've waited over 17 hours and just left without their issues being addressed--so it didn't feel appropriate to subject him to that with Omicron surges and everything else he's dealing with).   He came back today for his first COVID vaccine dose and I asked him if he checked his BP at home. He said he checked it last night and it was "normal" but it was again elevated while he was here in clinic. Just wanted to bring to your attention in case you wanted to bring patient in for a follow-up or adjust any of his BP medications again. Let me know if there is anything our team can do to help. He is seeing Dr. Chryl Heck (medical oncologist) tomorrow afternoon for consultation and lab work  Thank you for your help,  Elie Confer (O: [phone])

## 2020-03-04 ENCOUNTER — Ambulatory Visit
Admission: RE | Admit: 2020-03-04 | Discharge: 2020-03-04 | Disposition: A | Discharge status: Home / Self Care | Payer: Medicare Other | Source: Ambulatory Visit | Attending: Radiation Oncology | Admitting: Radiation Oncology

## 2020-03-04 ENCOUNTER — Encounter: Payer: Self-pay | Admitting: Radiation Oncology

## 2020-03-04 ENCOUNTER — Telehealth: Payer: Self-pay | Admitting: *Deleted

## 2020-03-04 VITALS — BP 196/92 | HR 75 | Temp 97.4°F | Resp 20 | Ht 69.0 in | Wt 212.4 lb

## 2020-03-04 DIAGNOSIS — Z510 Encounter for antineoplastic radiation therapy: Secondary | ICD-10-CM | POA: Diagnosis not present

## 2020-03-04 DIAGNOSIS — C01 Malignant neoplasm of base of tongue: Secondary | ICD-10-CM

## 2020-03-04 DIAGNOSIS — C108 Malignant neoplasm of overlapping sites of oropharynx: Secondary | ICD-10-CM | POA: Diagnosis not present

## 2020-03-04 DIAGNOSIS — Z452 Encounter for adjustment and management of vascular access device: Secondary | ICD-10-CM | POA: Diagnosis not present

## 2020-03-04 MED ORDER — SODIUM CHLORIDE 0.9% FLUSH
10.0000 mL | Freq: Once | INTRAVENOUS | Status: AC
  Administered 2020-03-04: 10 mL via INTRAVENOUS

## 2020-03-04 NOTE — Progress Notes (Addendum)
Oncology Nurse Navigator Documentation  To provide support, encouragement and care continuity, met with [name] during his CT SIM.  He tolerated procedure without difficulty, denied questions/concerns.   I toured him to [facility] 2  treatment area, explained procedures for lobby registration, arrival to Radiation Waiting, and arrival to treatment area.  He voiced understanding.  I have notified him of his hearing test on 03/11/20 at 10:20 at Dr. Deeann Saint office.  I encouraged him to call me prior to 03/14/20 New Start with any questions or concerns.   Harlow Asa RN, BSN, OCN Head & Neck Oncology Nurse [place] at [facility] Phone # [phone]  Fax # [phone]

## 2020-03-04 NOTE — Telephone Encounter (Signed)
----- Message from [name], MD sent at 03/04/2020  8:16 AM EST ----- Regarding: RE: Elevated BP Yes, I would be glad to.    Ronald Londo, please tell the patient: Recommend he check sugars TID before meals.  Continue basal insulin (levemir) and we can cover him with humalog sliding scale for the three sugar readings he gets at meal time.   150-200-2 units, 201-250- 4 units, 251-300-6 units, 301-350-8 units, 351-400 10 units, 401+ 12 units.   I would like to see him in one week with his recorded sugars to make adjustments in levemir and recheck bp on Chlorthalidone. Tom ----- Message ----- From: [name], MD Sent: 03/04/2020   8:08 AM EST To: [name], MD, [name] [name], RN, # Subject: RE: Elevated BP                                Oh reading this message a bit late I guess.  I dont feel l can do a great job managing the diabetes since I haven't been in touch with the diabetes management at least for the past several yrs.  Dr Dennard Schaumann, would you be able to help [name] with this. Appreciate your assistance very much.  ----- Message ----- From: [name], MD Sent: 03/02/2020   3:43 PM EST To: Ernst Spell, RN, [name], MD, # Subject: RE: Elevated BP                                Hi everyone,   Also, while we are discussing this patient, I wanted to mention that his son alluded to his complex diabetes history.  Apparently he has a history of getting close to diabetic comas?  They are wondering if his diabetes will be actively managed by medical oncology or by his PCP.  He meets medical oncology tomorrow.  Something to keep in mind and establish as a team moving forward...  Thanks! [name] MD Radiation oncology  ----- Message ----- From: Zola Button, RN Sent: 03/02/2020  12:05 PM EST To: [name], MD, Ernst Spell, RN, # Subject: Elevated BP                                    Dr. Dennard Schaumann,   Mr. Molina is a new patient  here at the [facility]. He came yesterday for his radiation consult and we noticed his BP was significantly elevated. He stated that he's taking all his BP medications as directed, and said he just felt anxious and thought it was because he had walked far away from where he parked in the parking lot. But when I rechecked his BP again after his visit with Dr. Isidore Moos it was still very elevated. He had no other symptoms (headaches, dizziness, shortness of breath) and was very resistant to going to ED because he feels like it's just him being anxious having to come to so many medical appointments (and to be honest the last few times we've sent patients to the ED they've waited over 17 hours and just left without their issues being addressed--so it didn't feel appropriate to subject him to that with Omicron surges and everything else he's dealing with).   He came back today for his first COVID vaccine dose and  I asked him if he checked his BP at home. He said he checked it last night and it was "normal" but it was again elevated while he was here in clinic. Just wanted to bring to your attention in case you wanted to bring patient in for a follow-up or adjust any of his BP medications again. Let me know if there is anything our team can do to help. He is seeing Dr. Chryl Heck (medical oncologist) tomorrow afternoon for consultation and lab work  Thank you for your help,  Elie Confer (O: [phone])

## 2020-03-04 NOTE — Progress Notes (Signed)
Has armband been applied?  Yes.    Does patient have an allergy to IV contrast dye?: No.   Has patient ever received premedication for IV contrast dye?: No.   Does patient take metformin?: No.  Date of lab work: March 10, 2020 BUN: 19 CR: 1.26  IV site: wrist right, condition patent and no redness  Has IV site been added to flowsheet?  Yes.    Vitals:   03/04/20 0848  BP: (!) 196/92  Pulse: 75  Resp: 20  Temp: (!) 97.4 F (36.3 C)  SpO2: 98%

## 2020-03-04 NOTE — Telephone Encounter (Signed)
Call placed to patient. LMTRC.  

## 2020-03-04 NOTE — Telephone Encounter (Signed)
----- Message from [name], MD sent at 03/04/2020  8:16 AM EST ----- Regarding: RE: Elevated BP Yes, I would be glad to.    Cobin Cadavid, please tell the patient: Recommend he check sugars TID before meals.  Continue basal insulin (levemir) and we can cover him with humalog sliding scale for the three sugar readings he gets at meal time.   150-200-2 units, 201-250- 4 units, 251-300-6 units, 301-350-8 units, 351-400 10 units, 401+ 12 units.   I would like to see him in one week with his recorded sugars to make adjustments in levemir and recheck bp on Chlorthalidone. Tom ----- Message ----- From: [name], MD Sent: 03/04/2020   8:08 AM EST To: [name], MD, [name] [name], RN, # Subject: RE: Elevated BP                                Oh reading this message a bit late I guess.  I dont feel l can do a great job managing the diabetes since I haven't been in touch with the diabetes management at least for the past several yrs.  Dr Dennard Schaumann, would you be able to help [name] with this. Appreciate your assistance very much.  ----- Message ----- From: [name], MD Sent: 03/02/2020   3:43 PM EST To: Ernst Spell, RN, [name], MD, # Subject: RE: Elevated BP                                Hi everyone,   Also, while we are discussing this patient, I wanted to mention that his son alluded to his complex diabetes history.  Apparently he has a history of getting close to diabetic comas?  They are wondering if his diabetes will be actively managed by medical oncology or by his PCP.  He meets medical oncology tomorrow.  Something to keep in mind and establish as a team moving forward...  Thanks! [name] MD Radiation oncology  ----- Message ----- From: Zola Button, RN Sent: 03/02/2020  12:05 PM EST To: [name], MD, Ernst Spell, RN, # Subject: Elevated BP                                    Dr. Dennard Schaumann,   Mr. Teale is a new patient  here at the [facility]. He came yesterday for his radiation consult and we noticed his BP was significantly elevated. He stated that he's taking all his BP medications as directed, and said he just felt anxious and thought it was because he had walked far away from where he parked in the parking lot. But when I rechecked his BP again after his visit with Dr. Isidore Moos it was still very elevated. He had no other symptoms (headaches, dizziness, shortness of breath) and was very resistant to going to ED because he feels like it's just him being anxious having to come to so many medical appointments (and to be honest the last few times we've sent patients to the ED they've waited over 17 hours and just left without their issues being addressed--so it didn't feel appropriate to subject him to that with Omicron surges and everything else he's dealing with).   He came back today for his first COVID vaccine dose and  I asked him if he checked his BP at home. He said he checked it last night and it was "normal" but it was again elevated while he was here in clinic. Just wanted to bring to your attention in case you wanted to bring patient in for a follow-up or adjust any of his BP medications again. Let me know if there is anything our team can do to help. He is seeing Dr. Chryl Heck (medical oncologist) tomorrow afternoon for consultation and lab work  Thank you for your help,  Elie Confer (O: [phone])

## 2020-03-05 ENCOUNTER — Other Ambulatory Visit: Payer: Self-pay | Admitting: Hematology and Oncology

## 2020-03-05 DIAGNOSIS — C01 Malignant neoplasm of base of tongue: Secondary | ICD-10-CM

## 2020-03-05 MED ORDER — PROCHLORPERAZINE MALEATE 10 MG PO TABS: 10.0000 mg | ORAL_TABLET | Freq: Four times a day (QID) | ORAL | 1 refills | Status: DC | PRN

## 2020-03-05 MED ORDER — DEXAMETHASONE 4 MG PO TABS: 8.0000 mg | ORAL_TABLET | Freq: Every day | ORAL | 1 refills | Status: DC

## 2020-03-05 MED ORDER — LIDOCAINE-PRILOCAINE 2.5-2.5 % EX CREA: TOPICAL_CREAM | CUTANEOUS | 3 refills | Status: DC

## 2020-03-05 MED ORDER — LORAZEPAM 0.5 MG PO TABS: 0.5000 mg | ORAL_TABLET | Freq: Four times a day (QID) | ORAL | 0 refills | Status: DC | PRN

## 2020-03-05 MED ORDER — ONDANSETRON HCL 8 MG PO TABS: 8.0000 mg | ORAL_TABLET | Freq: Two times a day (BID) | ORAL | 1 refills | Status: DC | PRN

## 2020-03-07 ENCOUNTER — Inpatient Hospital Stay: Payer: Medicare Other

## 2020-03-07 ENCOUNTER — Other Ambulatory Visit: Payer: Self-pay

## 2020-03-07 ENCOUNTER — Telehealth: Payer: Self-pay | Admitting: Hematology and Oncology

## 2020-03-07 DIAGNOSIS — C01 Malignant neoplasm of base of tongue: Secondary | ICD-10-CM

## 2020-03-07 NOTE — Telephone Encounter (Signed)
Rescheduled appointment time due to weather. Called patient, no answer. Left message with  Updated appointment time.

## 2020-03-07 NOTE — Progress Notes (Signed)
Pharmacist Chemotherapy Monitoring - Initial Assessment    Anticipated start date: 03/14/20  Regimen:  . Are orders appropriate based on the patient's diagnosis, regimen, and cycle? Yes . Does the plan date match the patient's scheduled date? Yes . Is the sequencing of drugs appropriate? Yes . Are the premedications appropriate for the patient's regimen? Yes . Prior Authorization for treatment is: Approved o If applicable, is the correct biosimilar selected based on the patient's insurance? not applicable  Organ Function and Labs: Marland Kitchen Are dose adjustments needed based on the patient's renal function, hepatic function, or hematologic function? No . Are appropriate labs ordered prior to the start of patient's treatment? Yes . Other organ system assessment, if indicated: cisplatin: baseline audiogram . The following baseline labs, if indicated, have been ordered: cisplatin: K, Mg  Dose Assessment: . Are the drug doses appropriate? Yes . Are the following correct: o Drug concentrations Yes o IV fluid compatible with drug Yes o Administration routes Yes o Timing of therapy Yes . If applicable, does the patient have documented access for treatment and/or plans for port-a-cath placement? no . If applicable, have lifetime cumulative doses been properly documented and assessed? not applicable Lifetime Dose Tracking  No doses have been documented on this patient for the following tracked chemicals: Doxorubicin, Epirubicin, Idarubicin, Daunorubicin, Mitoxantrone, Bleomycin, Oxaliplatin, Carboplatin, Liposomal Doxorubicin  o   Toxicity Monitoring/Prevention: . The patient has the following take home antiemetics prescribed: Ondansetron and Prochlorperazine . The patient has the following take home medications prescribed: N/A . Medication allergies and previous infusion related reactions, if applicable, have been reviewed and addressed. Yes . The patient's current medication list has been assessed  for drug-drug interactions with their chemotherapy regimen. no significant drug-drug interactions were identified on review.  Order Review: . Are the treatment plan orders signed? Yes . Is the patient scheduled to see a provider prior to their treatment? Yes  I verify that I have reviewed each item in the above checklist and answered each question accordingly.  Romualdo Bolk [facility] 03/07/2020 12:06 PM

## 2020-03-08 ENCOUNTER — Other Ambulatory Visit: Payer: Self-pay | Admitting: Student

## 2020-03-09 ENCOUNTER — Ambulatory Visit (HOSPITAL_COMMUNITY)
Admission: RE | Admit: 2020-03-09 | Discharge: 2020-03-09 | Disposition: A | Discharge status: Home / Self Care | Payer: Medicare Other | Source: Ambulatory Visit | Attending: Hematology and Oncology | Admitting: Hematology and Oncology

## 2020-03-09 ENCOUNTER — Encounter (HOSPITAL_COMMUNITY): Payer: Self-pay

## 2020-03-09 ENCOUNTER — Other Ambulatory Visit: Payer: Self-pay

## 2020-03-09 DIAGNOSIS — C109 Malignant neoplasm of oropharynx, unspecified: Secondary | ICD-10-CM | POA: Diagnosis not present

## 2020-03-09 DIAGNOSIS — E119 Type 2 diabetes mellitus without complications: Secondary | ICD-10-CM | POA: Diagnosis not present

## 2020-03-09 DIAGNOSIS — I4891 Unspecified atrial fibrillation: Secondary | ICD-10-CM | POA: Diagnosis not present

## 2020-03-09 DIAGNOSIS — I10 Essential (primary) hypertension: Secondary | ICD-10-CM | POA: Diagnosis not present

## 2020-03-09 DIAGNOSIS — Z794 Long term (current) use of insulin: Secondary | ICD-10-CM | POA: Diagnosis not present

## 2020-03-09 DIAGNOSIS — I428 Other cardiomyopathies: Secondary | ICD-10-CM | POA: Insufficient documentation

## 2020-03-09 DIAGNOSIS — C01 Malignant neoplasm of base of tongue: Secondary | ICD-10-CM | POA: Insufficient documentation

## 2020-03-09 DIAGNOSIS — Z452 Encounter for adjustment and management of vascular access device: Secondary | ICD-10-CM | POA: Diagnosis not present

## 2020-03-09 DIAGNOSIS — Z7901 Long term (current) use of anticoagulants: Secondary | ICD-10-CM | POA: Insufficient documentation

## 2020-03-09 DIAGNOSIS — E785 Hyperlipidemia, unspecified: Secondary | ICD-10-CM | POA: Insufficient documentation

## 2020-03-09 DIAGNOSIS — C108 Malignant neoplasm of overlapping sites of oropharynx: Secondary | ICD-10-CM | POA: Diagnosis not present

## 2020-03-09 DIAGNOSIS — Z79899 Other long term (current) drug therapy: Secondary | ICD-10-CM | POA: Insufficient documentation

## 2020-03-09 DIAGNOSIS — Z510 Encounter for antineoplastic radiation therapy: Secondary | ICD-10-CM | POA: Diagnosis not present

## 2020-03-09 HISTORY — PX: IR IMAGING GUIDED PORT INSERTION: IMG5740

## 2020-03-09 LAB — CBC
HCT: 41.7 % (ref 39.0–52.0)
Hemoglobin: 13.7 g/dL (ref 13.0–17.0)
MCH: 28.1 pg (ref 26.0–34.0)
MCHC: 32.9 g/dL (ref 30.0–36.0)
MCV: 85.6 fL (ref 80.0–100.0)
Platelets: 244 10*3/uL (ref 150–400)
RBC: 4.87 MIL/uL (ref 4.22–5.81)
RDW: 14.2 % (ref 11.5–15.5)
WBC: 8.5 10*3/uL (ref 4.0–10.5)
nRBC: 0 % (ref 0.0–0.2)

## 2020-03-09 LAB — PROTIME-INR
INR: 1.1 (ref 0.8–1.2)
Prothrombin Time: 13.5 seconds (ref 11.4–15.2)

## 2020-03-09 LAB — GLUCOSE, CAPILLARY: Glucose-Capillary: 182 mg/dL — ABNORMAL HIGH (ref 70–99)

## 2020-03-09 MED ORDER — HEPARIN SOD (PORK) LOCK FLUSH 100 UNIT/ML IV SOLN
INTRAVENOUS | Status: AC | PRN
  Administered 2020-03-09: 500 [IU] via INTRAVENOUS

## 2020-03-09 MED ORDER — HEPARIN SOD (PORK) LOCK FLUSH 100 UNIT/ML IV SOLN
INTRAVENOUS | Status: AC
  Filled 2020-03-09: qty 5

## 2020-03-09 MED ORDER — FENTANYL CITRATE (PF) 100 MCG/2ML IJ SOLN
INTRAMUSCULAR | Status: AC
  Filled 2020-03-09: qty 2

## 2020-03-09 MED ORDER — LIDOCAINE HCL 1 % IJ SOLN
INTRAMUSCULAR | Status: AC
  Filled 2020-03-09: qty 20

## 2020-03-09 MED ORDER — MIDAZOLAM HCL 2 MG/2ML IJ SOLN
INTRAMUSCULAR | Status: AC | PRN
  Administered 2020-03-09 (×3): 1 mg via INTRAVENOUS

## 2020-03-09 MED ORDER — CEFAZOLIN SODIUM-DEXTROSE 2-4 GM/100ML-% IV SOLN: 2.0000 g | INTRAVENOUS | Status: AC

## 2020-03-09 MED ORDER — LIDOCAINE HCL (PF) 1 % IJ SOLN
INTRAMUSCULAR | Status: AC | PRN
  Administered 2020-03-09 (×2): 10 mL via INTRADERMAL

## 2020-03-09 MED ORDER — MIDAZOLAM HCL 2 MG/2ML IJ SOLN
INTRAMUSCULAR | Status: AC
  Filled 2020-03-09: qty 4

## 2020-03-09 MED ORDER — CEFAZOLIN SODIUM-DEXTROSE 2-4 GM/100ML-% IV SOLN
INTRAVENOUS | Status: AC
  Administered 2020-03-09: 2 g via INTRAVENOUS
  Filled 2020-03-09: qty 100

## 2020-03-09 MED ORDER — SODIUM CHLORIDE 0.9 % IV SOLN: INTRAVENOUS | Status: DC

## 2020-03-09 MED ORDER — FENTANYL CITRATE (PF) 100 MCG/2ML IJ SOLN
INTRAMUSCULAR | Status: AC | PRN
  Administered 2020-03-09 (×2): 50 ug via INTRAVENOUS

## 2020-03-09 MED ORDER — POTASSIUM CHLORIDE IN NACL 40-0.9 MEQ/L-% IV SOLN: INTRAVENOUS | Status: DC

## 2020-03-09 NOTE — Discharge Instructions (Signed)
Please call Interventional Radiology clinic [phone] with any questions or concerns.  You may remove your dressing and shower tomorrow.  DO NOT use EMLA cream for 2 weeks after port placement as this cream will remove surgical glue on your incision.   Implanted Port Insertion, Care After This sheet gives you information about how to care for yourself after your procedure. Your health care provider may also give you more specific instructions. If you have problems or questions, contact your health care provider. What can I expect after the procedure? After the procedure, it is common to have:  Discomfort at the port insertion site.  Bruising on the skin over the port. This should improve over 3-4 days. Follow these instructions at home: [facility] care  After your port is placed, you will get a manufacturer's information card. The card has information about your port. Keep this card with you at all times.  Take care of the port as told by your health care provider. Ask your health care provider if you or a family member can get training for taking care of the port at home. A home health care nurse may also take care of the port.  Make sure to remember what type of port you have. Incision care  Follow instructions from your health care provider about how to take care of your port insertion site. Make sure you: ? Wash your hands with soap and water before and after you change your bandage (dressing). If soap and water are not available, use hand sanitizer. ? Change your dressing as told by your health care provider. ? Leave stitches (sutures), skin glue, or adhesive strips in place. These skin closures may need to stay in place for 2 weeks or longer. If adhesive strip edges start to loosen and curl up, you may trim the loose edges. Do not remove adhesive strips completely unless your health care provider tells you to do that.  Check your port insertion site every day for signs of infection.  Check for: ? Redness, swelling, or pain. ? Fluid or blood. ? Warmth. ? Pus or a bad smell.      Activity  Return to your normal activities as told by your health care provider. Ask your health care provider what activities are safe for you.  Do not lift anything that is heavier than 10 lb (4.5 kg), or the limit that you are told, until your health care provider says that it is safe. General instructions  Take over-the-counter and prescription medicines only as told by your health care provider.  Do not take baths, swim, or use a hot tub until your health care provider approves. Ask your health care provider if you may take showers. You may only be allowed to take sponge baths.  Do not drive for 24 hours if you were given a sedative during your procedure.  Wear a medical alert bracelet in case of an emergency. This will tell any health care providers that you have a port.  Keep all follow-up visits as told by your health care provider. This is important. Contact a health care provider if:  You cannot flush your port with saline as directed, or you cannot draw blood from the port.  You have a fever or chills.  You have redness, swelling, or pain around your port insertion site.  You have fluid or blood coming from your port insertion site.  Your port insertion site feels warm to the touch.  You have pus or a  bad smell coming from the port insertion site. Get help right away if:  You have chest pain or shortness of breath.  You have bleeding from your port that you cannot control. Summary  Take care of the port as told by your health care provider. Keep the manufacturer's information card with you at all times.  Change your dressing as told by your health care provider.  Contact a health care provider if you have a fever or chills or if you have redness, swelling, or pain around your port insertion site.  Keep all follow-up visits as told by your health care  provider. This information is not intended to replace advice given to you by your health care provider. Make sure you discuss any questions you have with your health care provider. Document Revised: 09/03/2017 Document Reviewed: 09/03/2017 Elsevier Patient Education  2021 [place].   Moderate Conscious Sedation, Adult, Care After This sheet gives you information about how to care for yourself after your procedure. Your health care provider may also give you more specific instructions. If you have problems or questions, contact your health care provider. What can I expect after the procedure? After the procedure, it is common to have:  Sleepiness for several hours.  Impaired judgment for several hours.  Difficulty with balance.  Vomiting if you eat too soon. Follow these instructions at home: For the time period you were told by your health care provider:  Rest.  Do not participate in activities where you could fall or become injured.  Do not drive or use machinery.  Do not drink alcohol.  Do not take sleeping pills or medicines that cause drowsiness.  Do not make important decisions or sign legal documents.  Do not take care of children on your own.      Eating and drinking  Follow the diet recommended by your health care provider.  Drink enough fluid to keep your urine pale yellow.  If you vomit: ? Drink water, juice, or soup when you can drink without vomiting. ? Make sure you have little or no nausea before eating solid foods.   General instructions  Take over-the-counter and prescription medicines only as told by your health care provider.  Have a responsible adult stay with you for the time you are told. It is important to have someone help care for you until you are awake and alert.  Do not smoke.  Keep all follow-up visits as told by your health care provider. This is important. Contact a health care provider if:  You are still sleepy or having  trouble with balance after 24 hours.  You feel light-headed.  You keep feeling nauseous or you keep vomiting.  You develop a rash.  You have a fever.  You have redness or swelling around the IV site. Get help right away if:  You have trouble breathing.  You have new-onset confusion at home. Summary  After the procedure, it is common to feel sleepy, have impaired judgment, or feel nauseous if you eat too soon.  Rest after you get home. Know the things you should not do after the procedure.  Follow the diet recommended by your health care provider and drink enough fluid to keep your urine pale yellow.  Get help right away if you have trouble breathing or new-onset confusion at home. This information is not intended to replace advice given to you by your health care provider. Make sure you discuss any questions you have with your health care  provider. Document Revised: 06/05/2019 Document Reviewed: 01/01/2019 Elsevier Patient Education  2021 [employer].

## 2020-03-09 NOTE — Consult Note (Signed)
Chief Complaint: Patient was seen in consultation today for Port-A-Cath placement  Referring Physician(s): Iruku,Praveena  Supervising Physician: Corrie Mckusick  Patient Status: [facility] - Out-pt  History of Present Illness: Brian [name] is a 65 y.o. male with past medical history significant for atrial fibrillation, cardiomyopathy, prior CVA in 2012, diabetes, hyperlipidemia, hypertension, and now with newly diagnosed oropharyngeal cancer.  He presents today for Port-A-Cath placement for chemotherapy.  Past Medical History:  Diagnosis Date  . Arthritis   . Atrial fibrillation ([place])   . Cardiomyopathy   . Cerebrovascular accident ([place])    2012  . Diabetes mellitus   . Hyperlipidemia   . Hypertension   . Left-sided weakness   . 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 Zigmund Daniel, laser therapy OD  . Seizures ([place])    pt reports this is from low blood sugar    Past Surgical History:  Procedure Laterality Date  . DIRECT LARYNGOSCOPY N/A 02/10/2020   Procedure: DIRECT LARYNGOSCOPY WITH BIOPSY;  Surgeon: [name], MD;  Location: [place];  Service: ENT;  Laterality: N/A;  . Ear surgery as a child    . TONSILLECTOMY      Allergies: Codeine  Medications: Prior to Admission medications   Medication Sig Start Date End Date Taking? Authorizing Provider  amLODipine (NORVASC) 10 MG tablet Take 1 tablet (10 mg total) by mouth daily. 07/21/19  Yes [name], MD  carvedilol (COREG) 25 MG tablet TAKE 1 TABLET BY MOUTH TWICE A DAY WITH A MEAL Patient taking differently: Take 25 mg by mouth 2 (two) times daily with a meal. 01/28/20  Yes [name], MD  chlorthalidone (HYGROTON) 25 MG tablet Take 1 tablet (25 mg total) by mouth daily. 03/03/20  Yes [name], MD  glucose blood (ONE TOUCH ULTRA TEST) test strip CHECK BLOOD SUGAR FOUR TIMES DAILY (e11.9) 09/18/17  Yes [name], MD  hydrALAZINE (APRESOLINE) 50 MG tablet TAKE 1 TABLET BY MOUTH THREE TIMES  A DAY Patient taking differently: Take 50 mg by mouth 3 (three) times daily. 05/25/19  Yes [name], MD  Insulin Pen Needle (B-D UF III MINI PEN NEEDLES) 31G X 5 MM MISC USE DAILY WITH INSULIN 04/10/19  Yes [name], MD  Insulin Syringe-Needle U-100 (INSULIN SYRINGE .3CC/31GX5/16") 31G X 5/16" 0.3 ML MISC Use twice daily to inject insulin. 01/10/15  Yes [name], MD  LEVEMIR FLEXTOUCH 100 UNIT/ML FlexPen INJECT 70 UNITS UNDER THE SKIN EVERY MORNING AND 40 UNITS AT BEDTIME 02/22/20  Yes [name], MD  losartan (COZAAR) 50 MG tablet Take 1 tablet (50 mg total) by mouth daily. 09/14/19  Yes [name], MD  pioglitazone (ACTOS) 15 MG tablet Take 1 tablet (15 mg total) by mouth daily. 12/01/19  Yes [name], MD  atorvastatin (LIPITOR) 20 MG tablet TAKE 1 TABLET (20 MG TOTAL) DAILY BY MOUTH. 02/22/20   [name], MD  dexamethasone (DECADRON) 4 MG tablet Take 2 tablets (8 mg total) by mouth daily. Take daily x 3 days starting the day after cisplatin chemotherapy. Take with food. 03/05/20   [name], MD  furosemide (LASIX) 20 MG tablet TAKE 1 TABLET (20 MG TOTAL) BY MOUTH DAILY AS NEEDED FOR FLUID. Patient not taking: Reported on 03/03/2020 03/24/19   [name], MD  lidocaine-prilocaine (EMLA) cream Apply to affected area once 03/05/20   [name], MD  LORazepam (ATIVAN) 0.5 MG tablet Take 1  tablet (0.5 mg total) by mouth every 6 (six) hours as needed (Nausea or vomiting). 03/05/20   [name], MD  ondansetron (ZOFRAN) 8 MG tablet Take 1 tablet (8 mg total) by mouth 2 (two) times daily as needed. Start on the third day after cisplatin chemotherapy. 03/05/20   [name], MD  prochlorperazine (COMPAZINE) 10 MG tablet Take 1 tablet (10 mg total) by mouth every 6 (six) hours as needed (Nausea or vomiting). 03/05/20   [name], MD  warfarin (COUMADIN) 10 MG tablet Take 1 tablet (10 mg total) by mouth daily. 07/01/19   [name], MD     Family History  Problem Relation Age of Onset  . Hyperlipidemia Father   . Hypertension Father   . Coronary artery disease Neg Hx        no early CAD.  Marland Kitchen Colon cancer Neg Hx   . Stomach cancer Neg Hx   . Rectal cancer Neg Hx   . Esophageal cancer Neg Hx     Social History   Socioeconomic History  . Marital status: Divorced    Spouse name: Not on file  . Number of children: 2  . Years of education: Not on file  . Highest education level: Not on file  Occupational History  . Occupation: Disabled  Tobacco Use  . Smoking status: Never Smoker  . Smokeless tobacco: Never Used  Vaping Use  . Vaping Use: Never used  Substance and Sexual Activity  . Alcohol use: Yes    Alcohol/week: 0.0 standard drinks    Comment: seldom  . Drug use: Yes    Frequency: 7.0 times per week    Comment: marijauna for arthritis  . Sexual activity: Not Currently  Other Topics Concern  . Not on file  Social History Narrative   He has children.   Social Determinants of Health   Financial Resource Strain: Low Risk   . Difficulty of Paying Living Expenses: Not very hard  Food Insecurity: No Food Insecurity  . Worried About Charity fundraiser in the Last Year: Never true  . Ran Out of Food in the Last Year: Never true  Transportation Needs: No Transportation Needs  . Lack of Transportation (Medical): No  . Lack of Transportation (Non-Medical): No  Physical Activity: Not on file  Stress: Stress Concern Present  . Feeling of Stress : To some extent  Social Connections: Socially Isolated  . Frequency of Communication with Friends and Family: More than three times a week  . Frequency of Social Gatherings with Friends and Family: More than three times a week  . Attends Religious Services: Never  . Active Member of Clubs or Organizations: No  . Attends Archivist Meetings: Never  . Marital Status: Divorced      Review of Systems currently denies fever, headache, chest  pain, dyspnea, cough, abdominal/back pain, nausea, vomiting, dysphagia or bleeding.  Vital Signs: BP (!) 173/93 (BP Location: Right Arm)   Temp 98.3 F (36.8 C)   Resp 18   Ht 5\' 9"  (1.753 m)   Wt 213 lb (96.6 kg)   BMI 31.45 kg/m   Physical Exam awake, alert.  Chest clear to auscultation bilaterally.  Heart with regular rate and rhythm.  Abdomen soft, positive bowel sounds, nontender.  No significant lower extremity edema.  Bilateral cervical lymphadenopathy.  Imaging: NM PET Image Initial (PI) Skull Base To Thigh  Result Date: 02/26/2020 CLINICAL DATA:  Initial treatment strategy for oropharyngeal carcinoma. EXAM:  NUCLEAR MEDICINE PET SKULL BASE TO THIGH TECHNIQUE: 10.4 mCi F-18 FDG was injected intravenously. Full-ring PET imaging was performed from the skull base to thigh after the radiotracer. CT data was obtained and used for attenuation correction and anatomic localization. Fasting blood glucose: 4 170 mg/dl COMPARISON:  neck CT 01/28/2020 CT 09/20/2010 FINDINGS: Mediastinal blood pool activity: SUV max 3.1 Liver activity: SUV max 4.2 NECK: Intense hypermetabolic activity localizes to the RIGHT base of tongue and extends into the RIGHT lingual tonsil. Activity is confined to the mucosal layer. Activity is intense with SUV max equal 10.3 (image 48). Bilateral large bulky intensely hypermetabolic level II lymph nodes (SUV max equal 14). Nodal mass on the RIGHT measures 4.4 cm and on the LEFT 4.8 cm. Inferior to the dominant masses are there several additional small hypermetabolic level II lymph nodes on the LEFT and RIGHT at the level the hyoid. For example 8 mm LEFT inferior level II node (image 51) with SUV max equal 8.9. No more inferior hypermetabolic nodes are identified. No supraclavicular hypermetabolic nodes. Incidental CT findings: Remote infarction noted in the RIGHT parietal lobe. CHEST: No hypermetabolic mediastinal or hilar nodes. Two small pulmonary nodules in the inferior RIGHT  middle lobe measure 3 mm and 4 mm on image 102 and 104 series 4. The small pulmonary nodules do not have radiotracer activity. Additionally nodules are present on CT from 09/20/2010. Incidental CT findings: Coronary artery calcification and aortic atherosclerotic calcification. ABDOMEN/PELVIS: No abnormal hypermetabolic activity within the liver, pancreas, adrenal glands, or spleen. No hypermetabolic lymph nodes in the abdomen or pelvis. Incidental CT findings: none SKELETON: No focal hypermetabolic activity to suggest skeletal metastasis. Incidental CT findings: Degenerative osteophytosis of the spine. IMPRESSION: 1. Hypermetabolic lesion at the RIGHT base of tongue and RIGHT tonsil consistent with oropharyngeal carcinoma. Carcinoma appears confined to the mucosal surface. 2. Bulky bilateral hypermetabolic level II metastatic lymph nodes. Smaller inferior hypermetabolic level II metastatic lymph nodes. 3. No evidence of metastatic disease in the chest, abdomen, or pelvis. 4. Two small RIGHT middle lobe pulmonary nodules are present on CT from 2012 consistent with benign etiology Electronically Signed   By: Suzy Bouchard M.D.   On: 02/26/2020 10:36    Labs:  CBC: Recent Labs    11/30/19 0915 02/08/20 1430 03/09/20 0808  WBC 7.3 6.7 8.5  HGB 13.9 12.7* 13.7  HCT 43.4 39.2 41.7  PLT 206 220 244    COAGS: Recent Labs    11/30/19 0915 01/18/20 1159 02/08/20 1430 03/09/20 0808  INR 3.0* 2.2* 1.0 1.1    BMP: Recent Labs    08/03/19 0911 11/30/19 0915 02/08/20 1430  NA 138 138 136  K 4.3 4.3 3.9  CL 105 104 101  CO2 24 26 24   GLUCOSE 217* 196* 95  BUN 21 20 19   CALCIUM 9.1 8.9 9.0  CREATININE 1.32* 1.23 1.26*  GFRNONAA 56* 61 >60  GFRAA 65 70  --     LIVER FUNCTION TESTS: Recent Labs    08/03/19 0911 11/30/19 0915  BILITOT 0.4 0.5  AST 10 11  ALT 12 14  PROT 6.8 6.4    TUMOR MARKERS: No results for input(s): AFPTM, CEA, CA199, CHROMGRNA in the last 8760  hours.  Assessment and Plan:  65 y.o. male with past medical history significant for atrial fibrillation, cardiomyopathy, prior CVA in 2012, diabetes, hyperlipidemia, hypertension, and now with newly diagnosed oropharyngeal cancer.  He presents today for Port-A-Cath placement for chemotherapy.Risks and benefits of image guided  port-a-catheter placement was discussed with the patient including, but not limited to bleeding, infection, pneumothorax, or fibrin sheath development and need for additional procedures.  All of the patient's questions were answered, patient is agreeable to proceed. Consent signed and in chart.      Thank you for this interesting consult.  I greatly enjoyed meeting DRELYN PISTILLI and look forward to participating in their care.  A copy of this report was sent to the requesting provider on this date.  Electronically Signed: D. Rowe Robert, PA-C 03/09/2020, 8:48 AM   I spent a total of  25 minutes   in face to face in clinical consultation, greater than 50% of which was counseling/coordinating care for Port-A-Cath placement

## 2020-03-09 NOTE — Telephone Encounter (Signed)
Call placed to patient. LMTRC.  

## 2020-03-09 NOTE — Procedures (Signed)
Interventional Radiology Procedure Note  Procedure: Placement of a right IJ approach single lumen PowerPort.  Tip is positioned at the superior cavoatrial junction and catheter is ready for immediate use.  Complications: None Recommendations:  - Ok to shower tomorrow - Do not submerge for 7 days - Routine line care   Signed,  Ezekial Arns S. Mishael Krysiak, DO   

## 2020-03-10 ENCOUNTER — Other Ambulatory Visit: Payer: Self-pay

## 2020-03-10 ENCOUNTER — Encounter: Payer: Self-pay | Admitting: Family Medicine

## 2020-03-10 ENCOUNTER — Ambulatory Visit (INDEPENDENT_AMBULATORY_CARE_PROVIDER_SITE_OTHER): Payer: Medicare Other | Admitting: Family Medicine

## 2020-03-10 ENCOUNTER — Inpatient Hospital Stay: Payer: Medicare Other | Admitting: Nutrition

## 2020-03-10 ENCOUNTER — Encounter: Payer: Self-pay | Admitting: Hematology and Oncology

## 2020-03-10 VITALS — BP 182/100 | HR 91 | Temp 98.0°F | Wt 206.0 lb

## 2020-03-10 DIAGNOSIS — I10 Essential (primary) hypertension: Secondary | ICD-10-CM

## 2020-03-10 DIAGNOSIS — E669 Obesity, unspecified: Secondary | ICD-10-CM

## 2020-03-10 DIAGNOSIS — E1169 Type 2 diabetes mellitus with other specified complication: Secondary | ICD-10-CM

## 2020-03-10 MED ORDER — ONETOUCH DELICA LANCETS 33G MISC: 1.0000 | Freq: Two times a day (BID) | 11 refills | Status: DC

## 2020-03-10 MED ORDER — GLUCOSE BLOOD VI STRP: ORAL_STRIP | 3 refills | Status: DC

## 2020-03-10 MED ORDER — ONETOUCH VERIO IQ SYSTEM W/DEVICE KIT: 1.0000 | PACK | Freq: Two times a day (BID) | 0 refills | Status: DC

## 2020-03-10 MED ORDER — GLUCOSE BLOOD VI STRP: 1.0000 | ORAL_STRIP | 11 refills | Status: DC | PRN

## 2020-03-10 MED ORDER — CLONIDINE HCL 0.1 MG PO TABS: 0.1000 mg | ORAL_TABLET | Freq: Two times a day (BID) | ORAL | 3 refills | Status: DC

## 2020-03-10 NOTE — Progress Notes (Signed)
Subjective:    Patient ID: Brian [name], male    DOB: 12/06/1955, 65 y.o.   MRN: [id number]  03/03/20 Since I last saw the patient, he was diagnosed with squamous cell carcinoma.  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  He met with radiation oncologist recently.  At their office his systolic blood pressure was greater than 200.  Therefore I had him come in today urgently to be reassessed.  In his left arm his blood pressure was 190/100.  In his right arm it was 170/98.  He is consistently taking losartan 50 mg a day, amlodipine 10 mg a day, carvedilol 25 mg twice a day, hydralazine 50 mg 3 times a day.  He states that he is not missing any of his medication.  He has not been taking losartan.  He is also not taking Trulicity because it was too expensive.  At that time, my plan was: Blood pressure is dangerously high.  There is no evidence of endorgan damage.  He denies any chest pain shortness of breath or dyspnea on exertion.  He denies any strokelike symptoms.  He denies any oliguria or hematuria.  Our lab person is out sick today with COVID therefore I cannot check renal function or lab work.  I will start the patient on hydrochlorothiazide 25 mg a day and recheck blood pressure next week.  He is overdue to recheck an INR at that visit.  We will need to adjust his diabetic medication based on his sugars as he starts radiation therapy for the cancer in his neck.  I anticipate that his oral intake will decrease and this will likely affect his sugars and may cause him to start to drop.  03/10/20 Patient's blood pressure today is still extremely high at 182/100.  I confirmed this myself.  He denies any chest pain shortness of breath or dyspnea on exertion.  He has been taking the chlorthalidone.  He is not checking his blood sugars.  Oncology is concerned about hyperglycemia due to the Decadron if they are going to give him with  chemotherapy.  I am concerned that he is going to experience rapidly fluctuating sugars not only due to the steroids but also due to his intake which may be affected by the radiation to treat his neck as I anticipate that his oral intake will drop. Past Surgical History:  Procedure Laterality Date  . DIRECT LARYNGOSCOPY N/A 02/10/2020   Procedure: DIRECT LARYNGOSCOPY WITH BIOPSY;  Surgeon: [name], MD;  Location: [place];  Service: ENT;  Laterality: N/A;  . Ear surgery as a child    . IR IMAGING GUIDED PORT INSERTION  03/09/2020  . TONSILLECTOMY     Current Outpatient Medications on File Prior to Visit  Medication Sig Dispense Refill  . amLODipine (NORVASC) 10 MG tablet Take 1 tablet (10 mg total) by mouth daily. 90 tablet 1  . atorvastatin (LIPITOR) 20 MG tablet TAKE 1 TABLET (20 MG TOTAL) DAILY BY MOUTH. 90 tablet 3  . carvedilol (COREG) 25 MG tablet TAKE 1 TABLET BY MOUTH TWICE A DAY WITH A MEAL (Patient taking differently: Take 25 mg by mouth 2 (two) times daily with a meal.) 180 tablet 3  . chlorthalidone (HYGROTON) 25 MG tablet Take 1 tablet (25 mg total) by mouth daily. 30 tablet 1  . furosemide (LASIX) 20 MG tablet TAKE 1 TABLET (20 MG TOTAL)  BY MOUTH DAILY AS NEEDED FOR FLUID. 90 tablet 1  . hydrALAZINE (APRESOLINE) 50 MG tablet TAKE 1 TABLET BY MOUTH THREE TIMES A DAY (Patient taking differently: Take 50 mg by mouth 3 (three) times daily.) 270 tablet 1  . Insulin Pen Needle (B-D UF III MINI PEN NEEDLES) 31G X 5 MM MISC USE DAILY WITH INSULIN 100 each 5  . Insulin Syringe-Needle U-100 (INSULIN SYRINGE .3CC/31GX5/16") 31G X 5/16" 0.3 ML MISC Use twice daily to inject insulin. 100 each 10  . LEVEMIR FLEXTOUCH 100 UNIT/ML FlexPen INJECT 70 UNITS UNDER THE SKIN EVERY MORNING AND 40 UNITS AT BEDTIME 15 mL 3  . losartan (COZAAR) 50 MG tablet Take 1 tablet (50 mg total) by mouth daily. 90 tablet 3  . pioglitazone (ACTOS) 15 MG tablet Take 1 tablet (15 mg total) by mouth daily. 90 tablet 2  .  warfarin (COUMADIN) 10 MG tablet Take 1 tablet (10 mg total) by mouth daily. 90 tablet 3  . dexamethasone (DECADRON) 4 MG tablet Take 2 tablets (8 mg total) by mouth daily. Take daily x 3 days starting the day after cisplatin chemotherapy. Take with food. (Patient not taking: Reported on 03/10/2020) 30 tablet 1  . glucose blood (ONE TOUCH ULTRA TEST) test strip CHECK BLOOD SUGAR FOUR TIMES DAILY (e11.9) (Patient not taking: Reported on 03/10/2020) 400 each 3  . lidocaine-prilocaine (EMLA) cream Apply to affected area once (Patient not taking: Reported on 03/10/2020) 30 g 3  . LORazepam (ATIVAN) 0.5 MG tablet Take 1 tablet (0.5 mg total) by mouth every 6 (six) hours as needed (Nausea or vomiting). (Patient not taking: Reported on 03/10/2020) 30 tablet 0  . ondansetron (ZOFRAN) 8 MG tablet Take 1 tablet (8 mg total) by mouth 2 (two) times daily as needed. Start on the third day after cisplatin chemotherapy. (Patient not taking: Reported on 03/10/2020) 30 tablet 1  . prochlorperazine (COMPAZINE) 10 MG tablet Take 1 tablet (10 mg total) by mouth every 6 (six) hours as needed (Nausea or vomiting). (Patient not taking: Reported on 03/10/2020) 30 tablet 1   No current facility-administered medications on file prior to visit.   Allergies  Allergen Reactions  . Codeine Other (See Comments)    Unknown reaction, severe headach   Social History   Socioeconomic History  . Marital status: Divorced    Spouse name: Not on file  . Number of children: 2  . Years of education: Not on file  . Highest education level: Not on file  Occupational History  . Occupation: Disabled  Tobacco Use  . Smoking status: Never Smoker  . Smokeless tobacco: Never Used  Vaping Use  . Vaping Use: Never used  Substance and Sexual Activity  . Alcohol use: Yes    Alcohol/week: 0.0 standard drinks    Comment: seldom  . Drug use: Yes    Frequency: 7.0 times per week    Comment: marijauna for arthritis  . Sexual activity: Not  Currently  Other Topics Concern  . Not on file  Social History Narrative   He has children.   Social Determinants of Health   Financial Resource Strain: Low Risk   . Difficulty of Paying Living Expenses: Not very hard  Food Insecurity: No Food Insecurity  . Worried About Charity fundraiser in the Last Year: Never true  . Ran Out of Food in the Last Year: Never true  Transportation Needs: No Transportation Needs  . Lack of Transportation (Medical): No  . Lack of  Transportation (Non-Medical): No  Physical Activity: Not on file  Stress: Stress Concern Present  . Feeling of Stress : To some extent  Social Connections: Socially Isolated  . Frequency of Communication with Friends and Family: More than three times a week  . Frequency of Social Gatherings with Friends and Family: More than three times a week  . Attends Religious Services: Never  . Active Member of Clubs or Organizations: No  . Attends Archivist Meetings: Never  . Marital Status: Divorced  Human resources officer Violence: Not on file    Review of Systems  All other systems reviewed and are negative.      Objective:   Physical Exam Vitals reviewed.  HENT:     Head:     Salivary Glands: Left salivary gland is diffusely enlarged and tender.   Neck:   Cardiovascular:     Rate and Rhythm: Normal rate and regular rhythm.     Heart sounds: Normal heart sounds.  Pulmonary:     Effort: Pulmonary effort is normal. No respiratory distress.     Breath sounds: Normal breath sounds. No wheezing or rales.  Chest:     Chest wall: No tenderness.  Abdominal:     General: Bowel sounds are normal. There is no distension.     Palpations: Abdomen is soft.     Tenderness: There is no abdominal tenderness. There is no rebound.  Musculoskeletal:     Cervical back: Muscular tenderness present.     Right lower leg: No edema.     Left lower leg: No edema.  Lymphadenopathy:     Cervical: Cervical adenopathy present.      Right cervical: Superficial cervical adenopathy present.     Left cervical: Superficial cervical adenopathy present.           Assessment & Plan:  Essential hypertension, benign  Type 2 diabetes mellitus with obesity (HCC)  Add clonidine 0.1 mg twice daily and recheck blood pressure in 10 days.  He is off his Coumadin right now because he had a port placed yesterday.  He just started his Coumadin back this morning.  He will need to recheck an INR in 10 days.  Continue Levemir at his current dose however we will start the patient on sliding scale regular insulin.  He is to take 2 units of insulin for every 50 points blood sugar is above 150.  For instance between 150 and 200 he will take 2 units, between 201 and 250 he will take 4 units, between 251 and 300 he will take 6 units, etc.  Also established the patient with our clinical pharmacist for diabetic education on how to use the sliding scale and which will be the most affordable rapid acting insulin.

## 2020-03-10 NOTE — Progress Notes (Signed)
65 year old male diagnosed with tongue cancer to receive concurrent chemoradiation therapy.  Past medical history includes diabetes type 2, hypertension, atrial fib, CHF, CVA, epilepsy, hyperlipidemia, and marijuana usage.  Medications include Lipitor, Lasix, insulin, Actos, and Coumadin.  Labs include glucose 182 on January 19.  Height: 5 feet 9 inches. Weight: 206 pounds. Usual body weight: 210-215 pounds per patient. BMI: 30.42.  Noted patient's MD has added sliding scale insulin for improved blood sugar control during treatment. Currently patient does not have difficulty eating. Reports he typically eats eggs with cheese, crisp bacon and hashbrowns or breakfast sandwich with fruit for breakfast.  He consumes a sandwich with some type of potato and sugar-free tea at lunch.  Dinner is either [employer] or a meat and vegetable his wife prepares.  He will snack on chips occasionally. Reports he is knowledgeable about potential need for feeding tube however this is on hold for now.  Nutrition diagnosis:  Predicted suboptimal energy intake related to tongue cancer and associated treatments as evidenced by a condition for which research shows suboptimal energy intake.  Intervention: Educated to consume small, frequent meals and snacks with high-calorie, high-protein foods for weight maintenance. Reviewed diet for better glycemic control. Encouraged patient to sample oral nutrition supplements so he is able to try a variety and stock up as needed. Brief education provided on feeding tube usage. Provided nutrition facts sheets.  Questions were answered.  Teach back method used.  Contact information was given.  Monitoring, evaluation, goals: Patient will tolerate adequate calories and protein for adequate healing and minimal weight loss.  Next visit: Monday, January 24.  **Disclaimer: This note was dictated with voice recognition software. Similar sounding words can inadvertently be  transcribed and this note may contain transcription errors which may not have been corrected upon publication of note.**

## 2020-03-10 NOTE — Progress Notes (Signed)
Met with patient in lobby to introduce myself as Arboriculturist and to offer available resources.  Discussed one-time $1000 Radio broadcast assistant to assist with personal expenses while going through treatment.  Advised what is needed to apply and he will bring at next visit.  He has my card for any additional questions or concerns.

## 2020-03-10 NOTE — Telephone Encounter (Signed)
Multiple calls placed to patient with no answer and no return call.   Message to be closed.  

## 2020-03-11 DIAGNOSIS — H906 Mixed conductive and sensorineural hearing loss, bilateral: Secondary | ICD-10-CM | POA: Diagnosis not present

## 2020-03-11 DIAGNOSIS — C140 Malignant neoplasm of pharynx, unspecified: Secondary | ICD-10-CM | POA: Diagnosis not present

## 2020-03-14 ENCOUNTER — Inpatient Hospital Stay: Payer: Medicare Other

## 2020-03-14 ENCOUNTER — Ambulatory Visit
Admission: RE | Admit: 2020-03-14 | Discharge: 2020-03-14 | Disposition: A | Discharge status: Home / Self Care | Payer: Medicare Other | Source: Ambulatory Visit | Attending: Radiation Oncology | Admitting: Radiation Oncology

## 2020-03-14 ENCOUNTER — Encounter: Payer: Self-pay | Admitting: Hematology and Oncology

## 2020-03-14 ENCOUNTER — Other Ambulatory Visit: Payer: Self-pay

## 2020-03-14 ENCOUNTER — Inpatient Hospital Stay: Payer: Medicare Other | Admitting: Nutrition

## 2020-03-14 ENCOUNTER — Inpatient Hospital Stay (HOSPITAL_BASED_OUTPATIENT_CLINIC_OR_DEPARTMENT_OTHER): Payer: Medicare Other | Admitting: Hematology and Oncology

## 2020-03-14 VITALS — BP 128/65 | HR 81 | Temp 98.1°F | Resp 18 | Ht 69.0 in | Wt 204.0 lb

## 2020-03-14 DIAGNOSIS — N179 Acute kidney failure, unspecified: Secondary | ICD-10-CM | POA: Diagnosis not present

## 2020-03-14 DIAGNOSIS — C109 Malignant neoplasm of oropharynx, unspecified: Secondary | ICD-10-CM | POA: Diagnosis not present

## 2020-03-14 DIAGNOSIS — C108 Malignant neoplasm of overlapping sites of oropharynx: Secondary | ICD-10-CM | POA: Diagnosis not present

## 2020-03-14 DIAGNOSIS — C01 Malignant neoplasm of base of tongue: Secondary | ICD-10-CM | POA: Diagnosis not present

## 2020-03-14 DIAGNOSIS — Z510 Encounter for antineoplastic radiation therapy: Secondary | ICD-10-CM | POA: Diagnosis not present

## 2020-03-14 DIAGNOSIS — Z23 Encounter for immunization: Secondary | ICD-10-CM | POA: Diagnosis not present

## 2020-03-14 DIAGNOSIS — G4700 Insomnia, unspecified: Secondary | ICD-10-CM | POA: Diagnosis not present

## 2020-03-14 DIAGNOSIS — I10 Essential (primary) hypertension: Secondary | ICD-10-CM | POA: Diagnosis not present

## 2020-03-14 DIAGNOSIS — Z452 Encounter for adjustment and management of vascular access device: Secondary | ICD-10-CM | POA: Diagnosis not present

## 2020-03-14 DIAGNOSIS — Z95828 Presence of other vascular implants and grafts: Secondary | ICD-10-CM

## 2020-03-14 DIAGNOSIS — Z5111 Encounter for antineoplastic chemotherapy: Secondary | ICD-10-CM | POA: Diagnosis not present

## 2020-03-14 DIAGNOSIS — I4891 Unspecified atrial fibrillation: Secondary | ICD-10-CM | POA: Diagnosis not present

## 2020-03-14 DIAGNOSIS — I2510 Atherosclerotic heart disease of native coronary artery without angina pectoris: Secondary | ICD-10-CM | POA: Diagnosis not present

## 2020-03-14 DIAGNOSIS — E119 Type 2 diabetes mellitus without complications: Secondary | ICD-10-CM | POA: Diagnosis not present

## 2020-03-14 LAB — CBC WITH DIFFERENTIAL/PLATELET
Abs Immature Granulocytes: 0.03 10*3/uL (ref 0.00–0.07)
Basophils Absolute: 0 10*3/uL (ref 0.0–0.1)
Basophils Relative: 0 %
Eosinophils Absolute: 0.1 10*3/uL (ref 0.0–0.5)
Eosinophils Relative: 1 %
HCT: 37.6 % — ABNORMAL LOW (ref 39.0–52.0)
Hemoglobin: 12.1 g/dL — ABNORMAL LOW (ref 13.0–17.0)
Immature Granulocytes: 0 %
Lymphocytes Relative: 14 %
Lymphs Abs: 1.1 10*3/uL (ref 0.7–4.0)
MCH: 27.4 pg (ref 26.0–34.0)
MCHC: 32.2 g/dL (ref 30.0–36.0)
MCV: 85.3 fL (ref 80.0–100.0)
Monocytes Absolute: 0.6 10*3/uL (ref 0.1–1.0)
Monocytes Relative: 8 %
Neutro Abs: 6.1 10*3/uL (ref 1.7–7.7)
Neutrophils Relative %: 77 %
Platelets: 222 10*3/uL (ref 150–400)
RBC: 4.41 MIL/uL (ref 4.22–5.81)
RDW: 14 % (ref 11.5–15.5)
WBC: 7.9 10*3/uL (ref 4.0–10.5)
nRBC: 0 % (ref 0.0–0.2)

## 2020-03-14 LAB — BASIC METABOLIC PANEL
Anion gap: 11 (ref 5–15)
BUN: 42 mg/dL — ABNORMAL HIGH (ref 8–23)
CO2: 24 mmol/L (ref 22–32)
Calcium: 9.2 mg/dL (ref 8.9–10.3)
Chloride: 101 mmol/L (ref 98–111)
Creatinine, Ser: 1.97 mg/dL — ABNORMAL HIGH (ref 0.61–1.24)
GFR, Estimated: 37 mL/min — ABNORMAL LOW (ref >60)
Glucose, Bld: 223 mg/dL — ABNORMAL HIGH (ref 70–99)
Potassium: 4.4 mmol/L (ref 3.5–5.1)
Sodium: 136 mmol/L (ref 135–145)

## 2020-03-14 LAB — MAGNESIUM: Magnesium: 1.9 mg/dL (ref 1.7–2.4)

## 2020-03-14 MED ORDER — SODIUM CHLORIDE 0.9 % IV SOLN
Freq: Once | INTRAVENOUS | Status: DC
  Filled 2020-03-14: qty 250

## 2020-03-14 MED ORDER — SODIUM CHLORIDE 0.9 % IV SOLN
Freq: Once | INTRAVENOUS | Status: AC
  Filled 2020-03-14: qty 250

## 2020-03-14 MED ORDER — HEPARIN SOD (PORK) LOCK FLUSH 100 UNIT/ML IV SOLN
500.0000 [IU] | Freq: Once | INTRAVENOUS | Status: AC
  Administered 2020-03-14: 500 [IU] via INTRAVENOUS
  Filled 2020-03-14: qty 5

## 2020-03-14 MED ORDER — SODIUM CHLORIDE 0.9% FLUSH
10.0000 mL | Freq: Once | INTRAVENOUS | Status: AC
  Administered 2020-03-14: 10 mL via INTRAVENOUS
  Filled 2020-03-14: qty 10

## 2020-03-14 NOTE — Progress Notes (Signed)
Pharmacist Chemotherapy Monitoring - Initial Assessment    Anticipated start date: 03/21/20  Regimen:  . Are orders appropriate based on the patient's diagnosis, regimen, and cycle? Yes . Does the plan date match the patient's scheduled date? Yes . Is the sequencing of drugs appropriate? Yes . Are the premedications appropriate for the patient's regimen? Yes . Prior Authorization for treatment is: Approved o If applicable, is the correct biosimilar selected based on the patient's insurance? not applicable  Organ Function and Labs: Marland Kitchen Are dose adjustments needed based on the patient's renal function, hepatic function, or hematologic function? No . Are appropriate labs ordered prior to the start of patient's treatment? Yes . Other organ system assessment, if indicated: cisplatin: baseline audiogram . The following baseline labs, if indicated, have been ordered: cisplatin: K, Mg  Dose Assessment: . Are the drug doses appropriate? Yes . Are the following correct: o Drug concentrations Yes o IV fluid compatible with drug Yes o Administration routes Yes o Timing of therapy Yes . If applicable, does the patient have documented access for treatment and/or plans for port-a-cath placement? yes . If applicable, have lifetime cumulative doses been properly documented and assessed? not applicable Lifetime Dose Tracking  No doses have been documented on this patient for the following tracked chemicals: Doxorubicin, Epirubicin, Idarubicin, Daunorubicin, Mitoxantrone, Bleomycin, Oxaliplatin, Carboplatin, Liposomal Doxorubicin  o   Toxicity Monitoring/Prevention: . The patient has the following take home antiemetics prescribed: Ondansetron and Prochlorperazine . The patient has the following take home medications prescribed: N/A . Medication allergies and previous infusion related reactions, if applicable, have been reviewed and addressed. Yes . The patient's current medication list has been assessed  for drug-drug interactions with their chemotherapy regimen. no significant drug-drug interactions were identified on review.  Order Review: . Are the treatment plan orders signed? Yes . Is the patient scheduled to see a provider prior to their treatment? Yes  I verify that I have reviewed each item in the above checklist and answered each question accordingly.   Romualdo Bolk Tracy , [facility] 03/14/2020 3:39 PM

## 2020-03-14 NOTE — Progress Notes (Signed)
[place] NOTE  Patient Care Team: [name], MD as PCP - General (Family Medicine) [name], [name], RN as Oncology Nurse Navigator [name], MD as Consulting Physician (Radiation Oncology) [name], MD as Consulting Physician (Hematology and Oncology) [name], RD as Dietitian (Nutrition) [name], [name], [name] as Speech Language Pathologist (Speech Pathology) [name], [name], PT as Physical Therapist (Physical Therapy) Justice Britain as Social Worker (General Practice)  CHIEF COMPLAINTS/PURPOSE OF CONSULTATION:  SCC of oropharynx.  ASSESSMENT & PLAN:  No problem-specific Assessment & Plan notes found for this encounter.  No orders of the defined types were placed in this encounter.  This is a 65 year old male patient with past medical history significant for diabetes, hypertension, coronary artery disease, stroke, atrial fibrillation on anticoagulation, obesity referred to medical oncology for evaluation and recommendations regarding new diagnosis of squamous cell carcinoma of oropharynx. 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 I have reviewed the current recommendations for stage III HPV positive oropharyngeal cancer with the patient today which is primarily concurrent chemoradiation. Given his comorbidities, age, I think he is a good candidate for weekly cisplatin along with radiation.  We have discussed about adverse side effects of cisplatin including but not limited to fatigue, nausea, vomiting, cytopenias, increased risk of infections, permanent hearing loss, nephrotoxicity etc. Baseline audiology referral done by Dr Benjamine Mola, waiting for report. He understands the risk of permanent hearing loss with cisplatin. No concerning symptoms except referred otalgia,  PE stable from last week. He is due to get cisplatin today but ended up having a  creatinine of 1.9 and BUN of 42 hence we will have to hydrate him, repeat BMP and re assess. Will try to reschedule his chemotherapy based on  If his creatinine doesn't show improvement, will have to change to carboplatin. He is now amenable to PEG placement, again has to hold blood thinners for about 4 days prior to procedure. He understands the risk of blood clots off of blood thinners.   [name] MD   HISTORY OF PRESENTING ILLNESS:   Brian [name] is a 65 y.o. male who presented two-week history of facial swelling in late November/early December 2021. Subsequently, the patient saw Dr. Benjamine Mola, who performed a direct laryngoscopy with biopsy on the date of 02/10/2020. Final pathology revealed: squamous cell carcinoma, P16 positive  He had the following imaging performed  1. Soft tissue neck CT scan performed on 01/28/2020 revealing right glossotonsillar malignancy with bulky bilateral nodal disease. Nodal masses sowed definite extracapsular spread. There was also noted to be incidental advanced cervical spine degeneration with cord compression. 2. PET scan performed on 02/26/2020 revealing a hypermetabolic lesion at the right base of the tongue and right tonsil, consistent with oropharyngeal carcinoma. Carcinoma appeared to be confined to the mucosal surface. There were also noted to bulky bilateral hypermetabolic level II metastatic lymph nodes and smaller inferior hypermetabolic level II metastatic lymph nodes. There was no evidence of metastatic disease in the chest, abdomen, or pelvis. The two small right middle lobe pulmonary nodules present on CT from 2012 were consistent with benign etiology.  He is here to discuss role of chemoradiation. He denies any complaints except for face swelling. No difficulty swallowing, pain, difficulty with speech. He has noted some ear pain started recently on the left. He has a fib at baseline and has used anticoagulation, continues on  coumadin He had a  stroke about 9 yrs ago.    Interim History  He is here for follow up today No new complaints, has some referred otalgia in the left ear. No trouble swallowing or with speech  Review of systems otherwise quite unremarkable, he overall feels well although he is worried about the treatment.  REVIEW OF SYSTEMS:   Constitutional: Denies fevers, chills or abnormal night sweats Eyes: Denies blurriness of vision, double vision or watery eyes Ears, nose, mouth, throat, and face: Denies mucositis or sore throat.  He is able to swallow everything Respiratory: Denies cough, dyspnea or wheezes Cardiovascular: Denies palpitation, chest discomfort or lower extremity swelling Gastrointestinal:  Denies nausea, heartburn or change in bowel habits Skin: Denies abnormal skin rashes Lymphatics: Complains of masses in the neck  neurological:Denies numbness, tingling or new weaknesses Behavioral/Psych: Mood is stable, no new changes  All other systems were reviewed with the patient and are negative.  MEDICAL HISTORY:  Past Medical History:  Diagnosis Date  . Arthritis   . Atrial fibrillation ([place])   . Cardiomyopathy   . Cerebrovascular accident ([place])    2012  . Diabetes mellitus   . Hyperlipidemia   . Hypertension   . Left-sided weakness   . 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 Zigmund Daniel, laser therapy OD  . Seizures ([place])    pt reports this is from low blood sugar    SURGICAL HISTORY: Past Surgical History:  Procedure Laterality Date  . DIRECT LARYNGOSCOPY N/A 02/10/2020   Procedure: DIRECT LARYNGOSCOPY WITH BIOPSY;  Surgeon: [name], MD;  Location: [facility];  Service: ENT;  Laterality: N/A;  . Ear surgery as a child    . IR IMAGING GUIDED PORT INSERTION  03/09/2020  . TONSILLECTOMY      SOCIAL HISTORY: Social History   Socioeconomic History  . Marital status: Divorced    Spouse name: Not on file  . Number of children: 2  . Years of education: Not on  file  . Highest education level: Not on file  Occupational History  . Occupation: Disabled  Tobacco Use  . Smoking status: Never Smoker  . Smokeless tobacco: Never Used  Vaping Use  . Vaping Use: Never used  Substance and Sexual Activity  . Alcohol use: Yes    Alcohol/week: 0.0 standard drinks    Comment: seldom  . Drug use: Yes    Frequency: 7.0 times per week    Comment: marijauna for arthritis  . Sexual activity: Not Currently  Other Topics Concern  . Not on file  Social History Narrative   He has children.   Social Determinants of Health   Financial Resource Strain: Low Risk   . Difficulty of Paying Living Expenses: Not very hard  Food Insecurity: No Food Insecurity  . Worried About Charity fundraiser in the Last Year: Never true  . Ran Out of Food in the Last Year: Never true  Transportation Needs: No Transportation Needs  . Lack of Transportation (Medical): No  . Lack of Transportation (Non-Medical): No  Physical Activity: Not on file  Stress: Stress Concern Present  . Feeling of Stress : To some extent  Social Connections: Socially Isolated  . Frequency of Communication with Friends and Family: More than three times a week  . Frequency of Social Gatherings with Friends and Family: More than three times a week  . Attends Religious Services: Never  . Active Member of Clubs or Organizations: No  . Attends Archivist  Meetings: Never  . Marital Status: Divorced  Human resources officer Violence: Not on file    FAMILY HISTORY: Family History  Problem Relation Age of Onset  . Hyperlipidemia Father   . Hypertension Father   . Coronary artery disease Neg Hx        no early CAD.  Marland Kitchen Colon cancer Neg Hx   . Stomach cancer Neg Hx   . Rectal cancer Neg Hx   . Esophageal cancer Neg Hx     ALLERGIES:  is allergic to codeine.  MEDICATIONS:  Current Outpatient Medications  Medication Sig Dispense Refill  . amLODipine (NORVASC) 10 MG tablet Take 1 tablet (10 mg  total) by mouth daily. 90 tablet 1  . atorvastatin (LIPITOR) 20 MG tablet TAKE 1 TABLET (20 MG TOTAL) DAILY BY MOUTH. 90 tablet 3  . Blood Glucose Monitoring Suppl (ONETOUCH VERIO IQ SYSTEM) w/Device KIT 1 each by Does not apply route 2 (two) times daily. 1 kit 0  . carvedilol (COREG) 25 MG tablet TAKE 1 TABLET BY MOUTH TWICE A DAY WITH A MEAL (Patient taking differently: Take 25 mg by mouth 2 (two) times daily with a meal.) 180 tablet 3  . chlorthalidone (HYGROTON) 25 MG tablet Take 1 tablet (25 mg total) by mouth daily. 30 tablet 1  . cloNIDine (CATAPRES) 0.1 MG tablet Take 1 tablet (0.1 mg total) by mouth 2 (two) times daily. 60 tablet 3  . dexamethasone (DECADRON) 4 MG tablet Take 2 tablets (8 mg total) by mouth daily. Take daily x 3 days starting the day after cisplatin chemotherapy. Take with food. 30 tablet 1  . furosemide (LASIX) 20 MG tablet TAKE 1 TABLET (20 MG TOTAL) BY MOUTH DAILY AS NEEDED FOR FLUID. 90 tablet 1  . glucose blood test strip 1 each by Other route as needed for other. Use as instructed 100 each 11  . hydrALAZINE (APRESOLINE) 50 MG tablet TAKE 1 TABLET BY MOUTH THREE TIMES A DAY (Patient taking differently: Take 50 mg by mouth 3 (three) times daily.) 270 tablet 1  . Insulin Pen Needle (B-D UF III MINI PEN NEEDLES) 31G X 5 MM MISC USE DAILY WITH INSULIN 100 each 5  . Insulin Syringe-Needle U-100 (INSULIN SYRINGE .3CC/31GX5/16") 31G X 5/16" 0.3 ML MISC Use twice daily to inject insulin. 100 each 10  . LEVEMIR FLEXTOUCH 100 UNIT/ML FlexPen INJECT 70 UNITS UNDER THE SKIN EVERY MORNING AND 40 UNITS AT BEDTIME 15 mL 3  . lidocaine-prilocaine (EMLA) cream Apply to affected area once 30 g 3  . LORazepam (ATIVAN) 0.5 MG tablet Take 1 tablet (0.5 mg total) by mouth every 6 (six) hours as needed (Nausea or vomiting). 30 tablet 0  . losartan (COZAAR) 50 MG tablet Take 1 tablet (50 mg total) by mouth daily. 90 tablet 3  . ondansetron (ZOFRAN) 8 MG tablet Take 1 tablet (8 mg total) by  mouth 2 (two) times daily as needed. Start on the third day after cisplatin chemotherapy. 30 tablet 1  . OneTouch Delica Lancets 01U MISC 1 each by Does not apply route 2 (two) times daily. 200 each 11  . pioglitazone (ACTOS) 15 MG tablet Take 1 tablet (15 mg total) by mouth daily. 90 tablet 2  . prochlorperazine (COMPAZINE) 10 MG tablet Take 1 tablet (10 mg total) by mouth every 6 (six) hours as needed (Nausea or vomiting). 30 tablet 1  . warfarin (COUMADIN) 10 MG tablet Take 1 tablet (10 mg total) by mouth daily. 90 tablet 3  No current facility-administered medications for this visit.     PHYSICAL EXAMINATION: ECOG PERFORMANCE STATUS: 0 - Asymptomatic  Vitals:   03/14/20 0908  BP: 128/65  Pulse: 81  Resp: 18  Temp: 98.1 F (36.7 C)  SpO2: 99%   Filed Weights   03/14/20 0908  Weight: 204 lb (92.5 kg)    GENERAL:alert, no distress and comfortable SKIN: skin color, texture, turgor are normal, no rashes or significant lesions EYES: normal, conjunctiva are pink and non-injected, sclera clear OROPHARYNX: Hard to visualize tonsillar mass, he had a strong gag NECK: supple, thyroid normal size, non-tender, without nodularity LYMPH: Large palpable lymph nodes bilateral cervical region.  No supraclavicular lymphadenopathy LUNGS: clear to auscultation and percussion with normal breathing effort HEART: regular rate & rhythm and no murmurs and no lower extremity edema ABDOMEN:abdomen soft, non-tender and normal bowel sounds Musculoskeletal:no cyanosis of digits and no clubbing  PSYCH: alert & oriented x 3 with fluent speech NEURO: no focal motor/sensory deficits  LABORATORY DATA:  I have reviewed the data as listed Lab Results  Component Value Date   WBC 7.9 03/14/2020   HGB 12.1 (L) 03/14/2020   HCT 37.6 (L) 03/14/2020   MCV 85.3 03/14/2020   PLT 222 03/14/2020     Chemistry      Component Value Date/Time   NA 136 02/08/2020 1430   K 3.9 02/08/2020 1430   CL 101  02/08/2020 1430   CO2 24 02/08/2020 1430   BUN 19 02/08/2020 1430   CREATININE 1.26 (H) 02/08/2020 1430   CREATININE 1.23 11/30/2019 0915      Component Value Date/Time   CALCIUM 9.0 02/08/2020 1430   ALKPHOS 62 09/25/2016 0914   AST 11 11/30/2019 0915   ALT 14 11/30/2019 0915   BILITOT 0.5 11/30/2019 0915       RADIOGRAPHIC STUDIES: I have personally reviewed the radiological images as listed and agreed with the findings in the report. NM PET Image Initial (PI) Skull Base To Thigh  Result Date: 02/26/2020 CLINICAL DATA:  Initial treatment strategy for oropharyngeal carcinoma. EXAM: NUCLEAR MEDICINE PET SKULL BASE TO THIGH TECHNIQUE: 10.4 mCi F-18 FDG was injected intravenously. Full-ring PET imaging was performed from the skull base to thigh after the radiotracer. CT data was obtained and used for attenuation correction and anatomic localization. Fasting blood glucose: 4 170 mg/dl COMPARISON:  neck CT 01/28/2020 CT 09/20/2010 FINDINGS: Mediastinal blood pool activity: SUV max 3.1 Liver activity: SUV max 4.2 NECK: Intense hypermetabolic activity localizes to the RIGHT base of tongue and extends into the RIGHT lingual tonsil. Activity is confined to the mucosal layer. Activity is intense with SUV max equal 10.3 (image 48). Bilateral large bulky intensely hypermetabolic level II lymph nodes (SUV max equal 14). Nodal mass on the RIGHT measures 4.4 cm and on the LEFT 4.8 cm. Inferior to the dominant masses are there several additional small hypermetabolic level II lymph nodes on the LEFT and RIGHT at the level the hyoid. For example 8 mm LEFT inferior level II node (image 51) with SUV max equal 8.9. No more inferior hypermetabolic nodes are identified. No supraclavicular hypermetabolic nodes. Incidental CT findings: Remote infarction noted in the RIGHT parietal lobe. CHEST: No hypermetabolic mediastinal or hilar nodes. Two small pulmonary nodules in the inferior RIGHT middle lobe measure 3 mm and 4 mm  on image 102 and 104 series 4. The small pulmonary nodules do not have radiotracer activity. Additionally nodules are present on CT from 09/20/2010. Incidental CT findings: Coronary  artery calcification and aortic atherosclerotic calcification. ABDOMEN/PELVIS: No abnormal hypermetabolic activity within the liver, pancreas, adrenal glands, or spleen. No hypermetabolic lymph nodes in the abdomen or pelvis. Incidental CT findings: none SKELETON: No focal hypermetabolic activity to suggest skeletal metastasis. Incidental CT findings: Degenerative osteophytosis of the spine. IMPRESSION: 1. Hypermetabolic lesion at the RIGHT base of tongue and RIGHT tonsil consistent with oropharyngeal carcinoma. Carcinoma appears confined to the mucosal surface. 2. Bulky bilateral hypermetabolic level II metastatic lymph nodes. Smaller inferior hypermetabolic level II metastatic lymph nodes. 3. No evidence of metastatic disease in the chest, abdomen, or pelvis. 4. Two small RIGHT middle lobe pulmonary nodules are present on CT from 2012 consistent with benign etiology Electronically Signed   By: Suzy Bouchard M.D.   On: 02/26/2020 10:36   IR IMAGING GUIDED PORT INSERTION  Result Date: 03/09/2020 INDICATION: 65 year old male referred for port catheter EXAM: IMAGE GUIDED PORT CATHETER MEDICATIONS: 2 g Ancef; The antibiotic was administered within an appropriate time interval prior to skin puncture. ANESTHESIA/SEDATION: Moderate (conscious) sedation was employed during this procedure. A total of Versed 3.0 mg and Fentanyl 100 mcg was administered intravenously. Moderate Sedation Time: 20 minutes. The patient's level of consciousness and vital signs were monitored continuously by radiology nursing throughout the procedure under my direct supervision. FLUOROSCOPY TIME:  Fluoroscopy Time: 0 minutes 6 seconds (2 mGy). COMPLICATIONS: None PROCEDURE: The procedure, risks, benefits, and alternatives were explained to the patient. Questions  regarding the procedure were encouraged and answered. The patient understands and consents to the procedure. Ultrasound survey was performed with images stored and sent to PACs. The right neck and chest was prepped with chlorhexidine, and draped in the usual sterile fashion using maximum barrier technique (cap and mask, sterile gown, sterile gloves, large sterile sheet, hand hygiene and cutaneous antiseptic). Antibiotic prophylaxis was provided with 2.0g Ancef administered IV one hour prior to skin incision. Local anesthesia was attained by infiltration with 1% lidocaine without epinephrine. Ultrasound demonstrated patency of the right internal jugular vein, and this was documented with an image. Under real-time ultrasound guidance, this vein was accessed with a 21 gauge micropuncture needle and image documentation was performed. A small dermatotomy was made at the access site with an 11 scalpel. A 0.018" wire was advanced into the SVC and used to estimate the length of the internal catheter. The access needle exchanged for a 45F micropuncture vascular sheath. The 0.018" wire was then removed and a 0.035" wire advanced into the IVC. An appropriate location for the subcutaneous reservoir was selected below the clavicle and an incision was made through the skin and underlying soft tissues. The subcutaneous tissues were then dissected using a combination of blunt and sharp surgical technique and a pocket was formed. A single lumen power injectable portacatheter was then tunneled through the subcutaneous tissues from the pocket to the dermatotomy and the port reservoir placed within the subcutaneous pocket. The venous access site was then serially dilated and a peel away vascular sheath placed over the wire. The wire was removed and the port catheter advanced into position under fluoroscopic guidance. The catheter tip is positioned in the cavoatrial junction. This was documented with a spot image. The portacatheter was  then tested and found to flush and aspirate well. The port was flushed with saline followed by 100 units/mL heparinized saline. The pocket was then closed in two layers using first subdermal inverted interrupted absorbable sutures followed by a running subcuticular suture. The epidermis was then sealed with Dermabond. The  dermatotomy at the venous access site was also seal with Dermabond. Patient tolerated the procedure well and remained hemodynamically stable throughout. No complications encountered and no significant blood loss encountered IMPRESSION: Status post right IJ port catheter placement. Catheter ready for use. Signed, Dulcy Fanny. Dellia Nims, RPVI [facility] Electronically Signed   By: Corrie Mckusick D.O.   On: 03/09/2020 12:45   I reviewed the image myself.  Noted hypermetabolic lesions at the base of tongue on the right side and right tonsil as well as the bilateral bulky lymphadenopathy.  02/10/2020  SURGICAL PATHOLOGY    Reason for Addendum #1: Immunohistochemistry results   Clinical History: throat cancer (cm)    FINAL MICROSCOPIC DIAGNOSIS:   A. OROPHARYNGEAL, RIGHT, BIOPSY:  - Squamous cell carcinoma  - See comment   COMMENT:   P16 immunohistochemistry is pending and will be reported in an addendum.  Dr. Tresa Moore reviewed the case and agrees with the above diagnosis. Dr.  Benjamine Mola was notified of these results on February 11, 2020.   GROSS DESCRIPTION:   The specimen is received in saline and consists of a 1.6 x 1.5 x 0.3 cm  aggregate of tan-pink soft tissue. The specimen is entirely submitted  in 1 cassette. Craig Staggers 02/10/2020)    Final Diagnosis performed by Thressa Sheller, MD.  Electronically signed  02/11/2020  Technical and / or Professional components performed at [facility]. [facility], [place] [address] [place], [place] [address].  Immunohistochemistry Technical component (if applicable) was  performed  at [facility] [address] [place],  [place], [place] [address].  IMMUNOHISTOCHEMISTRY DISCLAIMER (if applicable):  Some of these immunohistochemical stains may have been developed and the  performance characteristics determine by [facility]. Some  may not have been cleared or approved by the U.S. Food and Drug  Administration. The FDA has determined that such clearance or approval  is not necessary. This test is used for clinical purposes. It should not  be regarded as investigational or for research. This laboratory is  certified under the [place]  (CLIA-88) as qualified to perform high complexity clinical laboratory  testing. The controls stained appropriately.   ADDENDUM:   P16 immunohistochemistry is POSITIVE.   All questions were answered. The patient knows to call the clinic with any problems, questions or concerns. I spent 30 minutes in the care of this patient including H and P, review of records, counseling, documentation and coordination of care.     [name], MD 03/14/2020 9:12 AM

## 2020-03-14 NOTE — Patient Instructions (Signed)
Rehydration, Adult Rehydration is the replacement of body fluids, salts, and minerals (electrolytes) that are lost during dehydration. Dehydration is when there is not enough water or other fluids in the body. This happens when you lose more fluids than you take in. Common causes of dehydration include:  Not drinking enough fluids. This can occur when you are ill or doing activities that require a lot of energy, especially in hot weather.  Conditions that cause loss of water or other fluids, such as diarrhea, vomiting, sweating, or urinating a lot.  Other illnesses, such as fever or infection.  Certain medicines, such as those that remove excess fluid from the body (diuretics). Symptoms of mild or moderate dehydration may include thirst, dry lips and mouth, and dizziness. Symptoms of severe dehydration may include increased heart rate, confusion, fainting, and not urinating. For severe dehydration, you may need to get fluids through an IV at the hospital. For mild or moderate dehydration, you can usually rehydrate at home by drinking certain fluids as told by your health care provider. What are the risks? Generally, rehydration is safe. However, taking in too much fluid (overhydration) can be a problem. This is rare. Overhydration can cause an electrolyte imbalance, kidney failure, or a decrease in salt (sodium) levels in the body. Supplies needed You will need an oral rehydration solution (ORS) if your health care provider tells you to use one. This is a drink to treat dehydration. It can be found in pharmacies and retail stores. How to rehydrate Fluids Follow instructions from your health care provider for rehydration. The kind of fluid and the amount you should drink depend on your condition. In general, you should choose drinks that you prefer.  If told by your health care provider, drink an ORS. ? Make an ORS by following instructions on the package. ? Start by drinking small amounts,  about  cup (120 mL) every 5-10 minutes. ? Slowly increase how much you drink until you have taken the amount recommended by your health care provider.  Drink enough clear fluids to keep your urine pale yellow. If you were told to drink an ORS, finish it first, then start slowly drinking other clear fluids. Drink fluids such as: ? Water. This includes sparkling water and flavored water. Drinking only water can lead to having too little sodium in your body (hyponatremia). Follow the advice of your health care provider. ? Water from ice chips you suck on. ? Fruit juice with water you add to it (diluted). ? Sports drinks. ? Hot or cold herbal teas. ? Broth-based soups. ? Milk or milk products. Food Follow instructions from your health care provider about what to eat while you rehydrate. Your health care provider may recommend that you slowly begin eating regular foods in small amounts.  Eat foods that contain a healthy balance of electrolytes, such as bananas, oranges, potatoes, tomatoes, and spinach.  Avoid foods that are greasy or contain a lot of sugar. In some cases, you may get nutrition through a feeding tube that is passed through your nose and into your stomach (nasogastric tube, or NG tube). This may be done if you have uncontrolled vomiting or diarrhea.   Beverages to avoid Certain beverages may make dehydration worse. While you rehydrate, avoid drinking alcohol.   How to tell if you are recovering from dehydration You may be recovering from dehydration if:  You are urinating more often than before you started rehydrating.  Your urine is pale yellow.  Your energy level   improves.  You vomit less frequently.  You have diarrhea less frequently.  Your appetite improves or returns to normal.  You feel less dizzy or less light-headed.  Your skin tone and color start to look more normal. Follow these instructions at home:  Take over-the-counter and prescription medicines only  as told by your health care provider.  Do not take sodium tablets. Doing this can lead to having too much sodium in your body (hypernatremia). Contact a health care provider if:  You continue to have symptoms of mild or moderate dehydration, such as: ? Thirst. ? Dry lips. ? Slightly dry mouth. ? Dizziness. ? Dark urine or less urine than normal. ? Muscle cramps.  You continue to vomit or have diarrhea. Get help right away if you:  Have symptoms of dehydration that get worse.  Have a fever.  Have a severe headache.  Have been vomiting and the following happens: ? Your vomiting gets worse or does not go away. ? Your vomit includes blood or green matter (bile). ? You cannot eat or drink without vomiting.  Have problems with urination or bowel movements, such as: ? Diarrhea that gets worse or does not go away. ? Blood in your stool (feces). This may cause stool to look black and tarry. ? Not urinating, or urinating only a small amount of very dark urine, within 6-8 hours.  Have trouble breathing.  Have symptoms that get worse with treatment. These symptoms may represent a serious problem that is an emergency. Do not wait to see if the symptoms will go away. Get medical help right away. Call your local emergency services (911 in the [place] Do not drive yourself to the hospital. Summary  Rehydration is the replacement of body fluids and minerals (electrolytes) that are lost during dehydration.  Follow instructions from your health care provider for rehydration. The kind of fluid and amount you should drink depend on your condition.  Slowly increase how much you drink until you have taken the amount recommended by your health care provider.  Contact your health care provider if you continue to show signs of mild or moderate dehydration. This information is not intended to replace advice given to you by your health care provider. Make sure you discuss any questions you have with  your health care provider. Document Revised: 04/08/2019 Document Reviewed: 02/16/2019 Elsevier Patient Education  2021 [employer]  

## 2020-03-14 NOTE — Progress Notes (Signed)
Per Dr. Chryl Heck: Hold Cisplatin treatment today due to elevated Scr. level of 1.97. Administer 1 L NS fluid over 2 hours and discharge home. Patient will return for repeat BMP this week and possible treatment.  Patient agreeable with treatment plan.

## 2020-03-14 NOTE — Progress Notes (Signed)
Oncology Nurse Navigator Documentation  To provide support, encouragement and care continuity, met with Mr. Stthomas for his initial RT.    I reviewed the 2-step treatment process, answered questions.   Mr. Walkins completed treatment without difficulty, denied questions/concerns.  I reviewed the registration/arrival procedure for subsequent treatments.  I assisted upstairs to be arrived for his appointments in medical oncology today.  I encouraged them to call me with questions/concerns as tmts proceed.   Harlow Asa RN, BSN, OCN Head [facility] Neck Oncology Nurse [facility] Phone # [phone]  Fax # [phone]

## 2020-03-14 NOTE — Progress Notes (Signed)
Nutrition follow-up completed with patient during infusion for tongue cancer. Patient receiving concurrent chemoradiation therapy. Weight decreased and was documented as 204 pounds on January 24 down from 206 pounds on January 20. Patient reports he still is eating within normal limits for him. He denies nutrition impact symptoms. He has not yet tried oral nutrition supplements.  Nutrition diagnosis:  Predicted suboptimal energy intake has evolved into inadequate oral intake related to tongue cancer and associated treatments as evidenced by 6-11 pound loss from usual body weight.  (Weight is trending down)  Estimated nutrition needs: 2200-2500 cal, 120-135 g protein, 2.4 L fluid.  Intervention: Educated patient to begin 1-2 cartons of Ensure Plus or equivalent daily between meals.  I provided a sample which patient is agreeable to consuming. Explained importance of increasing calories to minimize further weight loss. Provided samples and coupons. Noted patient is agreeable to feeding tube placement.  Will monitor placement and continue weekly follow-ups.  Monitoring, evaluation, goals: Patient will tolerate increased calories and protein to minimize further weight loss.  Next visit: Monday, January 31 during infusion.  **Disclaimer: This note was dictated with voice recognition software. Similar sounding words can inadvertently be transcribed and this note may contain transcription errors which may not have been corrected upon publication of note.**

## 2020-03-14 NOTE — Patient Instructions (Signed)
Implanted Port Insertion, Care After This sheet gives you information about how to care for yourself after your procedure. Your health care provider may also give you more specific instructions. If you have problems or questions, contact your health care provider. What can I expect after the procedure? After the procedure, it is common to have:  Discomfort at the port insertion site.  Bruising on the skin over the port. This should improve over 3-4 days. Follow these instructions at home: Port care  After your port is placed, you will get a manufacturer's information card. The card has information about your port. Keep this card with you at all times.  Take care of the port as told by your health care provider. Ask your health care provider if you or a family member can get training for taking care of the port at home. A home health care nurse may also take care of the port.  Make sure to remember what type of port you have. Incision care  Follow instructions from your health care provider about how to take care of your port insertion site. Make sure you: ? Wash your hands with soap and water before and after you change your bandage (dressing). If soap and water are not available, use hand sanitizer. ? Change your dressing as told by your health care provider. ? Leave stitches (sutures), skin glue, or adhesive strips in place. These skin closures may need to stay in place for 2 weeks or longer. If adhesive strip edges start to loosen and curl up, you may trim the loose edges. Do not remove adhesive strips completely unless your health care provider tells you to do that.  Check your port insertion site every day for signs of infection. Check for: ? Redness, swelling, or pain. ? Fluid or blood. ? Warmth. ? Pus or a bad smell.      Activity  Return to your normal activities as told by your health care provider. Ask your health care provider what activities are safe for you.  Do not  lift anything that is heavier than 10 lb (4.5 kg), or the limit that you are told, until your health care provider says that it is safe. General instructions  Take over-the-counter and prescription medicines only as told by your health care provider.  Do not take baths, swim, or use a hot tub until your health care provider approves. Ask your health care provider if you may take showers. You may only be allowed to take sponge baths.  Do not drive for 24 hours if you were given a sedative during your procedure.  Wear a medical alert bracelet in case of an emergency. This will tell any health care providers that you have a port.  Keep all follow-up visits as told by your health care provider. This is important. Contact a health care provider if:  You cannot flush your port with saline as directed, or you cannot draw blood from the port.  You have a fever or chills.  You have redness, swelling, or pain around your port insertion site.  You have fluid or blood coming from your port insertion site.  Your port insertion site feels warm to the touch.  You have pus or a bad smell coming from the port insertion site. Get help right away if:  You have chest pain or shortness of breath.  You have bleeding from your port that you cannot control. Summary  Take care of the port as told by your   health care provider. Keep the manufacturer's information card with you at all times.  Change your dressing as told by your health care provider.  Contact a health care provider if you have a fever or chills or if you have redness, swelling, or pain around your port insertion site.  Keep all follow-up visits as told by your health care provider. This information is not intended to replace advice given to you by your health care provider. Make sure you discuss any questions you have with your health care provider. Document Revised: 09/03/2017 Document Reviewed: 09/03/2017 Elsevier Patient Education   2021 [employer].  

## 2020-03-15 ENCOUNTER — Ambulatory Visit
Admission: RE | Admit: 2020-03-15 | Discharge: 2020-03-15 | Disposition: A | Discharge status: Home / Self Care | Payer: Medicare Other | Source: Ambulatory Visit | Attending: Radiation Oncology | Admitting: Radiation Oncology

## 2020-03-15 ENCOUNTER — Inpatient Hospital Stay: Payer: Medicare Other

## 2020-03-15 ENCOUNTER — Other Ambulatory Visit: Payer: Self-pay

## 2020-03-15 DIAGNOSIS — C108 Malignant neoplasm of overlapping sites of oropharynx: Secondary | ICD-10-CM | POA: Diagnosis not present

## 2020-03-15 DIAGNOSIS — I2510 Atherosclerotic heart disease of native coronary artery without angina pectoris: Secondary | ICD-10-CM | POA: Diagnosis not present

## 2020-03-15 DIAGNOSIS — G4700 Insomnia, unspecified: Secondary | ICD-10-CM | POA: Diagnosis not present

## 2020-03-15 DIAGNOSIS — Z452 Encounter for adjustment and management of vascular access device: Secondary | ICD-10-CM | POA: Diagnosis not present

## 2020-03-15 DIAGNOSIS — C01 Malignant neoplasm of base of tongue: Secondary | ICD-10-CM

## 2020-03-15 DIAGNOSIS — I10 Essential (primary) hypertension: Secondary | ICD-10-CM | POA: Diagnosis not present

## 2020-03-15 DIAGNOSIS — Z23 Encounter for immunization: Secondary | ICD-10-CM | POA: Diagnosis not present

## 2020-03-15 DIAGNOSIS — C109 Malignant neoplasm of oropharynx, unspecified: Secondary | ICD-10-CM | POA: Diagnosis not present

## 2020-03-15 DIAGNOSIS — I4891 Unspecified atrial fibrillation: Secondary | ICD-10-CM | POA: Diagnosis not present

## 2020-03-15 DIAGNOSIS — Z510 Encounter for antineoplastic radiation therapy: Secondary | ICD-10-CM | POA: Diagnosis not present

## 2020-03-15 DIAGNOSIS — E119 Type 2 diabetes mellitus without complications: Secondary | ICD-10-CM | POA: Diagnosis not present

## 2020-03-15 DIAGNOSIS — Z5111 Encounter for antineoplastic chemotherapy: Secondary | ICD-10-CM | POA: Diagnosis not present

## 2020-03-15 DIAGNOSIS — R5381 Other malaise: Secondary | ICD-10-CM

## 2020-03-15 DIAGNOSIS — N179 Acute kidney failure, unspecified: Secondary | ICD-10-CM | POA: Diagnosis not present

## 2020-03-15 LAB — BASIC METABOLIC PANEL - CANCER CENTER ONLY
Anion gap: 9 (ref 5–15)
BUN: 30 mg/dL — ABNORMAL HIGH (ref 8–23)
CO2: 25 mmol/L (ref 22–32)
Calcium: 9.6 mg/dL (ref 8.9–10.3)
Chloride: 101 mmol/L (ref 98–111)
Creatinine: 1.41 mg/dL — ABNORMAL HIGH (ref 0.61–1.24)
GFR, Estimated: 55 mL/min — ABNORMAL LOW (ref >60)
Glucose, Bld: 192 mg/dL — ABNORMAL HIGH (ref 70–99)
Potassium: 4.1 mmol/L (ref 3.5–5.1)
Sodium: 135 mmol/L (ref 135–145)

## 2020-03-15 LAB — TSH: TSH: 1.293 u[IU]/mL (ref 0.320–4.118)

## 2020-03-16 ENCOUNTER — Telehealth: Payer: Self-pay | Admitting: Hematology and Oncology

## 2020-03-16 ENCOUNTER — Other Ambulatory Visit: Payer: Self-pay

## 2020-03-16 ENCOUNTER — Ambulatory Visit
Admission: RE | Admit: 2020-03-16 | Discharge: 2020-03-16 | Disposition: A | Discharge status: Home / Self Care | Payer: Medicare Other | Source: Ambulatory Visit | Attending: Radiation Oncology | Admitting: Radiation Oncology

## 2020-03-16 ENCOUNTER — Other Ambulatory Visit: Payer: Self-pay | Admitting: Hematology and Oncology

## 2020-03-16 DIAGNOSIS — C108 Malignant neoplasm of overlapping sites of oropharynx: Secondary | ICD-10-CM | POA: Diagnosis not present

## 2020-03-16 DIAGNOSIS — C01 Malignant neoplasm of base of tongue: Secondary | ICD-10-CM | POA: Diagnosis not present

## 2020-03-16 DIAGNOSIS — Z510 Encounter for antineoplastic radiation therapy: Secondary | ICD-10-CM | POA: Diagnosis not present

## 2020-03-16 DIAGNOSIS — Z452 Encounter for adjustment and management of vascular access device: Secondary | ICD-10-CM | POA: Diagnosis not present

## 2020-03-16 NOTE — Telephone Encounter (Signed)
Scheduled appointments per 1/25 secure chat and inbasket msg from Dr. Chryl Heck. I spoke to patient who is aware of all upcoming appointments. Will have updated calendar printed for patient at Friday's visit.

## 2020-03-17 ENCOUNTER — Ambulatory Visit
Admission: RE | Admit: 2020-03-17 | Discharge: 2020-03-17 | Disposition: A | Discharge status: Home / Self Care | Payer: Medicare Other | Source: Ambulatory Visit | Attending: Radiation Oncology | Admitting: Radiation Oncology

## 2020-03-17 ENCOUNTER — Inpatient Hospital Stay: Payer: Medicare Other

## 2020-03-17 ENCOUNTER — Other Ambulatory Visit: Payer: Self-pay

## 2020-03-17 DIAGNOSIS — C01 Malignant neoplasm of base of tongue: Secondary | ICD-10-CM

## 2020-03-17 DIAGNOSIS — Z510 Encounter for antineoplastic radiation therapy: Secondary | ICD-10-CM | POA: Diagnosis not present

## 2020-03-17 DIAGNOSIS — Z452 Encounter for adjustment and management of vascular access device: Secondary | ICD-10-CM | POA: Diagnosis not present

## 2020-03-17 DIAGNOSIS — I2510 Atherosclerotic heart disease of native coronary artery without angina pectoris: Secondary | ICD-10-CM | POA: Diagnosis not present

## 2020-03-17 DIAGNOSIS — C109 Malignant neoplasm of oropharynx, unspecified: Secondary | ICD-10-CM | POA: Diagnosis not present

## 2020-03-17 DIAGNOSIS — I10 Essential (primary) hypertension: Secondary | ICD-10-CM | POA: Diagnosis not present

## 2020-03-17 DIAGNOSIS — Z23 Encounter for immunization: Secondary | ICD-10-CM | POA: Diagnosis not present

## 2020-03-17 DIAGNOSIS — I4891 Unspecified atrial fibrillation: Secondary | ICD-10-CM | POA: Diagnosis not present

## 2020-03-17 DIAGNOSIS — N179 Acute kidney failure, unspecified: Secondary | ICD-10-CM | POA: Diagnosis not present

## 2020-03-17 DIAGNOSIS — C108 Malignant neoplasm of overlapping sites of oropharynx: Secondary | ICD-10-CM | POA: Diagnosis not present

## 2020-03-17 DIAGNOSIS — Z5111 Encounter for antineoplastic chemotherapy: Secondary | ICD-10-CM | POA: Diagnosis not present

## 2020-03-17 DIAGNOSIS — G4700 Insomnia, unspecified: Secondary | ICD-10-CM | POA: Diagnosis not present

## 2020-03-17 DIAGNOSIS — E119 Type 2 diabetes mellitus without complications: Secondary | ICD-10-CM | POA: Diagnosis not present

## 2020-03-17 MED ORDER — SODIUM CHLORIDE 0.9 % IV SOLN
INTRAVENOUS | Status: DC
  Filled 2020-03-17 (×2): qty 250

## 2020-03-17 NOTE — Progress Notes (Signed)
Pt discharged in no apparent distress. Pt left ambulatory without assistance. Pt aware of discharge instructions and verbalized understanding and had no further questions.  

## 2020-03-17 NOTE — Patient Instructions (Signed)
Rehydration, Adult Rehydration is the replacement of body fluids, salts, and minerals (electrolytes) that are lost during dehydration. Dehydration is when there is not enough water or other fluids in the body. This happens when you lose more fluids than you take in. Common causes of dehydration include:  Not drinking enough fluids. This can occur when you are ill or doing activities that require a lot of energy, especially in hot weather.  Conditions that cause loss of water or other fluids, such as diarrhea, vomiting, sweating, or urinating a lot.  Other illnesses, such as fever or infection.  Certain medicines, such as those that remove excess fluid from the body (diuretics). Symptoms of mild or moderate dehydration may include thirst, dry lips and mouth, and dizziness. Symptoms of severe dehydration may include increased heart rate, confusion, fainting, and not urinating. For severe dehydration, you may need to get fluids through an IV at the hospital. For mild or moderate dehydration, you can usually rehydrate at home by drinking certain fluids as told by your health care provider. What are the risks? Generally, rehydration is safe. However, taking in too much fluid (overhydration) can be a problem. This is rare. Overhydration can cause an electrolyte imbalance, kidney failure, or a decrease in salt (sodium) levels in the body. Supplies needed You will need an oral rehydration solution (ORS) if your health care provider tells you to use one. This is a drink to treat dehydration. It can be found in pharmacies and retail stores. How to rehydrate Fluids Follow instructions from your health care provider for rehydration. The kind of fluid and the amount you should drink depend on your condition. In general, you should choose drinks that you prefer.  If told by your health care provider, drink an ORS. ? Make an ORS by following instructions on the package. ? Start by drinking small amounts,  about  cup (120 mL) every 5-10 minutes. ? Slowly increase how much you drink until you have taken the amount recommended by your health care provider.  Drink enough clear fluids to keep your urine pale yellow. If you were told to drink an ORS, finish it first, then start slowly drinking other clear fluids. Drink fluids such as: ? Water. This includes sparkling water and flavored water. Drinking only water can lead to having too little sodium in your body (hyponatremia). Follow the advice of your health care provider. ? Water from ice chips you suck on. ? Fruit juice with water you add to it (diluted). ? Sports drinks. ? Hot or cold herbal teas. ? Broth-based soups. ? Milk or milk products. Food Follow instructions from your health care provider about what to eat while you rehydrate. Your health care provider may recommend that you slowly begin eating regular foods in small amounts.  Eat foods that contain a healthy balance of electrolytes, such as bananas, oranges, potatoes, tomatoes, and spinach.  Avoid foods that are greasy or contain a lot of sugar. In some cases, you may get nutrition through a feeding tube that is passed through your nose and into your stomach (nasogastric tube, or NG tube). This may be done if you have uncontrolled vomiting or diarrhea.   Beverages to avoid Certain beverages may make dehydration worse. While you rehydrate, avoid drinking alcohol.   How to tell if you are recovering from dehydration You may be recovering from dehydration if:  You are urinating more often than before you started rehydrating.  Your urine is pale yellow.  Your energy level   improves.  You vomit less frequently.  You have diarrhea less frequently.  Your appetite improves or returns to normal.  You feel less dizzy or less light-headed.  Your skin tone and color start to look more normal. Follow these instructions at home:  Take over-the-counter and prescription medicines only  as told by your health care provider.  Do not take sodium tablets. Doing this can lead to having too much sodium in your body (hypernatremia). Contact a health care provider if:  You continue to have symptoms of mild or moderate dehydration, such as: ? Thirst. ? Dry lips. ? Slightly dry mouth. ? Dizziness. ? Dark urine or less urine than normal. ? Muscle cramps.  You continue to vomit or have diarrhea. Get help right away if you:  Have symptoms of dehydration that get worse.  Have a fever.  Have a severe headache.  Have been vomiting and the following happens: ? Your vomiting gets worse or does not go away. ? Your vomit includes blood or green matter (bile). ? You cannot eat or drink without vomiting.  Have problems with urination or bowel movements, such as: ? Diarrhea that gets worse or does not go away. ? Blood in your stool (feces). This may cause stool to look black and tarry. ? Not urinating, or urinating only a small amount of very dark urine, within 6-8 hours.  Have trouble breathing.  Have symptoms that get worse with treatment. These symptoms may represent a serious problem that is an emergency. Do not wait to see if the symptoms will go away. Get medical help right away. Call your local emergency services (911 in the [place] Do not drive yourself to the hospital. Summary  Rehydration is the replacement of body fluids and minerals (electrolytes) that are lost during dehydration.  Follow instructions from your health care provider for rehydration. The kind of fluid and amount you should drink depend on your condition.  Slowly increase how much you drink until you have taken the amount recommended by your health care provider.  Contact your health care provider if you continue to show signs of mild or moderate dehydration. This information is not intended to replace advice given to you by your health care provider. Make sure you discuss any questions you have with  your health care provider. Document Revised: 04/08/2019 Document Reviewed: 02/16/2019 Elsevier Patient Education  2021 [employer]  

## 2020-03-18 ENCOUNTER — Inpatient Hospital Stay: Payer: Medicare Other

## 2020-03-18 ENCOUNTER — Inpatient Hospital Stay (HOSPITAL_BASED_OUTPATIENT_CLINIC_OR_DEPARTMENT_OTHER): Payer: Medicare Other | Admitting: Hematology and Oncology

## 2020-03-18 ENCOUNTER — Encounter: Payer: Self-pay | Admitting: Hematology and Oncology

## 2020-03-18 ENCOUNTER — Other Ambulatory Visit: Payer: Self-pay

## 2020-03-18 ENCOUNTER — Ambulatory Visit
Admission: RE | Admit: 2020-03-18 | Discharge: 2020-03-18 | Disposition: A | Discharge status: Home / Self Care | Payer: Medicare Other | Source: Ambulatory Visit | Attending: Radiation Oncology | Admitting: Radiation Oncology

## 2020-03-18 VITALS — BP 115/62 | HR 69 | Temp 97.8°F | Resp 18 | Ht 69.0 in | Wt 202.1 lb

## 2020-03-18 DIAGNOSIS — Z452 Encounter for adjustment and management of vascular access device: Secondary | ICD-10-CM | POA: Diagnosis not present

## 2020-03-18 DIAGNOSIS — I10 Essential (primary) hypertension: Secondary | ICD-10-CM | POA: Diagnosis not present

## 2020-03-18 DIAGNOSIS — Z5111 Encounter for antineoplastic chemotherapy: Secondary | ICD-10-CM | POA: Diagnosis not present

## 2020-03-18 DIAGNOSIS — C01 Malignant neoplasm of base of tongue: Secondary | ICD-10-CM | POA: Diagnosis not present

## 2020-03-18 DIAGNOSIS — Z23 Encounter for immunization: Secondary | ICD-10-CM | POA: Diagnosis not present

## 2020-03-18 DIAGNOSIS — C108 Malignant neoplasm of overlapping sites of oropharynx: Secondary | ICD-10-CM | POA: Diagnosis not present

## 2020-03-18 DIAGNOSIS — G4700 Insomnia, unspecified: Secondary | ICD-10-CM | POA: Diagnosis not present

## 2020-03-18 DIAGNOSIS — Z95828 Presence of other vascular implants and grafts: Secondary | ICD-10-CM

## 2020-03-18 DIAGNOSIS — E119 Type 2 diabetes mellitus without complications: Secondary | ICD-10-CM | POA: Diagnosis not present

## 2020-03-18 DIAGNOSIS — N179 Acute kidney failure, unspecified: Secondary | ICD-10-CM | POA: Diagnosis not present

## 2020-03-18 DIAGNOSIS — I2510 Atherosclerotic heart disease of native coronary artery without angina pectoris: Secondary | ICD-10-CM | POA: Diagnosis not present

## 2020-03-18 DIAGNOSIS — I4891 Unspecified atrial fibrillation: Secondary | ICD-10-CM | POA: Diagnosis not present

## 2020-03-18 DIAGNOSIS — Z510 Encounter for antineoplastic radiation therapy: Secondary | ICD-10-CM | POA: Diagnosis not present

## 2020-03-18 DIAGNOSIS — C109 Malignant neoplasm of oropharynx, unspecified: Secondary | ICD-10-CM | POA: Diagnosis not present

## 2020-03-18 LAB — CBC WITH DIFFERENTIAL/PLATELET
Abs Immature Granulocytes: 0.02 10*3/uL (ref 0.00–0.07)
Basophils Absolute: 0 10*3/uL (ref 0.0–0.1)
Basophils Relative: 0 %
Eosinophils Absolute: 0.1 10*3/uL (ref 0.0–0.5)
Eosinophils Relative: 1 %
HCT: 38.9 % — ABNORMAL LOW (ref 39.0–52.0)
Hemoglobin: 12.7 g/dL — ABNORMAL LOW (ref 13.0–17.0)
Immature Granulocytes: 0 %
Lymphocytes Relative: 11 %
Lymphs Abs: 0.8 10*3/uL (ref 0.7–4.0)
MCH: 27.5 pg (ref 26.0–34.0)
MCHC: 32.6 g/dL (ref 30.0–36.0)
MCV: 84.4 fL (ref 80.0–100.0)
Monocytes Absolute: 0.5 10*3/uL (ref 0.1–1.0)
Monocytes Relative: 7 %
Neutro Abs: 5.7 10*3/uL (ref 1.7–7.7)
Neutrophils Relative %: 81 %
Platelets: 183 10*3/uL (ref 150–400)
RBC: 4.61 MIL/uL (ref 4.22–5.81)
RDW: 13.8 % (ref 11.5–15.5)
WBC: 7.1 10*3/uL (ref 4.0–10.5)
nRBC: 0 % (ref 0.0–0.2)

## 2020-03-18 LAB — BASIC METABOLIC PANEL
Anion gap: 10 (ref 5–15)
BUN: 29 mg/dL — ABNORMAL HIGH (ref 8–23)
CO2: 23 mmol/L (ref 22–32)
Calcium: 9 mg/dL (ref 8.9–10.3)
Chloride: 101 mmol/L (ref 98–111)
Creatinine, Ser: 1.44 mg/dL — ABNORMAL HIGH (ref 0.61–1.24)
GFR, Estimated: 54 mL/min — ABNORMAL LOW (ref >60)
Glucose, Bld: 242 mg/dL — ABNORMAL HIGH (ref 70–99)
Potassium: 3.8 mmol/L (ref 3.5–5.1)
Sodium: 134 mmol/L — ABNORMAL LOW (ref 135–145)

## 2020-03-18 LAB — PROTIME-INR
INR: 2.3 — ABNORMAL HIGH (ref 0.8–1.2)
Prothrombin Time: 24.7 seconds — ABNORMAL HIGH (ref 11.4–15.2)

## 2020-03-18 LAB — MAGNESIUM: Magnesium: 1.7 mg/dL (ref 1.7–2.4)

## 2020-03-18 MED ORDER — SODIUM CHLORIDE 0.9 % IV SOLN
Freq: Once | INTRAVENOUS | Status: DC
  Filled 2020-03-18: qty 250

## 2020-03-18 MED ORDER — HEPARIN SOD (PORK) LOCK FLUSH 100 UNIT/ML IV SOLN
500.0000 [IU] | INTRAVENOUS | Status: AC | PRN
  Administered 2020-03-18: 500 [IU]
  Filled 2020-03-18: qty 5

## 2020-03-18 MED ORDER — SODIUM CHLORIDE 0.9% FLUSH
10.0000 mL | INTRAVENOUS | Status: AC | PRN
  Administered 2020-03-18: 10 mL
  Filled 2020-03-18: qty 10

## 2020-03-18 NOTE — Progress Notes (Signed)
[place] NOTE  Patient Care Team: [name], MD as PCP - General (Family Medicine) [name], [name], RN as Oncology Nurse Navigator [name], MD as Consulting Physician (Radiation Oncology) [name], MD as Consulting Physician (Hematology and Oncology) [name], RD as Dietitian (Nutrition) [name], [name], [name] as Speech Language Pathologist (Speech Pathology) [name], [name], PT as Physical Therapist (Physical Therapy) Justice Britain as Social Worker (General Practice)  CHIEF COMPLAINTS/PURPOSE OF CONSULTATION:  SCC of oropharynx.  ASSESSMENT & PLAN:  No problem-specific Assessment & Plan notes found for this encounter.  No orders of the defined types were placed in this encounter.  This is a 65 year old male patient with past medical history significant for diabetes, hypertension, coronary artery disease, stroke, atrial fibrillation on anticoagulation, obesity referred to medical oncology for evaluation and recommendations regarding new diagnosis of squamous cell carcinoma of oropharynx. 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 I have reviewed the current recommendations for stage III HPV positive oropharyngeal cancer with the patient today which is primarily concurrent chemoradiation. Given his comorbidities, age, I think he is a good candidate for weekly cisplatin along with radiation.  We have discussed about adverse side effects of cisplatin including but not limited to fatigue, nausea, vomiting, cytopenias, increased risk of infections, permanent hearing loss, nephrotoxicity etc. Baseline audiology referral done by Dr Benjamine Mola, no severe hearing loss noted. Chemo delayed due to AKI and infusion scheduling. We tried to re arrange it to a sooner date. His creatinine today is still 1.4 with GFR of 54, so will reduce the dose of cisplatin by 25 % to  30 mg.m2. If creatinine and GFR continue to be an issue, will change weekly [place], I dont think he can tolerate a doublet.  2. Insomnia, will give him ambien 5 mg PO QHS  3. AKI, fluids today, encouraged 1.5 to 2 L of water intake a day  Arletha Pili Eufelia Veno MD   HISTORY OF PRESENTING ILLNESS:   Brian [name] is a 65 y.o. male who presented two-week history of facial swelling in late November/early December 2021. Subsequently, the patient saw Dr. Benjamine Mola, who performed a direct laryngoscopy with biopsy on the date of 02/10/2020. Final pathology revealed: squamous cell carcinoma, P16 positive  He had the following imaging performed  1. Soft tissue neck CT scan performed on 01/28/2020 revealing right glossotonsillar malignancy with bulky bilateral nodal disease. Nodal masses sowed definite extracapsular spread. There was also noted to be incidental advanced cervical spine degeneration with cord compression. 2. PET scan performed on 02/26/2020 revealing a hypermetabolic lesion at the right base of the tongue and right tonsil, consistent with oropharyngeal carcinoma. Carcinoma appeared to be confined to the mucosal surface. There were also noted to bulky bilateral hypermetabolic level II metastatic lymph nodes and smaller inferior hypermetabolic level II metastatic lymph nodes. There was no evidence of metastatic disease in the chest, abdomen, or pelvis. The two small right middle lobe pulmonary nodules present on CT from 2012 were consistent with benign etiology.  Started radiation on 03/14/2020 Chemo delayed by a week due to GFR and infusion issues.  Interim History  He is here for follow up today. He complains of insomnia. He thinks the otalgia has resolved. No cough, chest pain, SOB No trouble swallowing or with speech  He feels tired He drinks three bottles of water a week, drinks unsweetened green tea otherwise.  Review of  systems otherwise quite unremarkable, he overall feels well  although he is worried about the treatment.  REVIEW OF SYSTEMS:   Constitutional: Denies fevers, chills or abnormal night sweats Eyes: Denies blurriness of vision, double vision or watery eyes Ears, nose, mouth, throat, and face: Denies mucositis or sore throat.  He is able to swallow everything Respiratory: Denies cough, dyspnea or wheezes Cardiovascular: Denies palpitation, chest discomfort or lower extremity swelling Gastrointestinal:  Denies nausea, heartburn or change in bowel habits Skin: Denies abnormal skin rashes Lymphatics: Complains of masses in the neck  neurological:Denies numbness, tingling or new weaknesses Behavioral/Psych: Mood is stable, no new changes  All other systems were reviewed with the patient and are negative.  MEDICAL HISTORY:  Past Medical History:  Diagnosis Date  . Arthritis   . Atrial fibrillation ([place])   . Cardiomyopathy   . Cerebrovascular accident ([place])    2012  . Diabetes mellitus   . Hyperlipidemia   . Hypertension   . Left-sided weakness   . 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 Zigmund Daniel, laser therapy OD  . Seizures ([place])    pt reports this is from low blood sugar    SURGICAL HISTORY: Past Surgical History:  Procedure Laterality Date  . DIRECT LARYNGOSCOPY N/A 02/10/2020   Procedure: DIRECT LARYNGOSCOPY WITH BIOPSY;  Surgeon: [name], MD;  Location: [place];  Service: ENT;  Laterality: N/A;  . Ear surgery as a child    . IR IMAGING GUIDED PORT INSERTION  03/09/2020  . TONSILLECTOMY      SOCIAL HISTORY: Social History   Socioeconomic History  . Marital status: Divorced    Spouse name: Not on file  . Number of children: 2  . Years of education: Not on file  . Highest education level: Not on file  Occupational History  . Occupation: Disabled  Tobacco Use  . Smoking status: Never Smoker  . Smokeless tobacco: Never Used  Vaping Use  . Vaping Use: Never used  Substance and Sexual Activity  . Alcohol use: Yes     Alcohol/week: 0.0 standard drinks    Comment: seldom  . Drug use: Yes    Frequency: 7.0 times per week    Comment: marijauna for arthritis  . Sexual activity: Not Currently  Other Topics Concern  . Not on file  Social History Narrative   He has children.   Social Determinants of Health   Financial Resource Strain: Low Risk   . Difficulty of Paying Living Expenses: Not very hard  Food Insecurity: No Food Insecurity  . Worried About Charity fundraiser in the Last Year: Never true  . Ran Out of Food in the Last Year: Never true  Transportation Needs: No Transportation Needs  . Lack of Transportation (Medical): No  . Lack of Transportation (Non-Medical): No  Physical Activity: Not on file  Stress: Stress Concern Present  . Feeling of Stress : To some extent  Social Connections: Socially Isolated  . Frequency of Communication with Friends and Family: More than three times a week  . Frequency of Social Gatherings with Friends and Family: More than three times a week  . Attends Religious Services: Never  . Active Member of Clubs or Organizations: No  . Attends Archivist Meetings: Never  . Marital Status: Divorced  Human resources officer Violence: Not on file    FAMILY HISTORY: Family History  Problem Relation Age of Onset  . Hyperlipidemia Father   . Hypertension Father   .  Coronary artery disease Neg Hx        no early CAD.  Marland Kitchen Colon cancer Neg Hx   . Stomach cancer Neg Hx   . Rectal cancer Neg Hx   . Esophageal cancer Neg Hx     ALLERGIES:  is allergic to codeine.  MEDICATIONS:  Current Outpatient Medications  Medication Sig Dispense Refill  . amLODipine (NORVASC) 10 MG tablet Take 1 tablet (10 mg total) by mouth daily. 90 tablet 1  . atorvastatin (LIPITOR) 20 MG tablet TAKE 1 TABLET (20 MG TOTAL) DAILY BY MOUTH. 90 tablet 3  . Blood Glucose Monitoring Suppl (ONETOUCH VERIO IQ SYSTEM) w/Device KIT 1 each by Does not apply route 2 (two) times daily. 1 kit 0  .  carvedilol (COREG) 25 MG tablet TAKE 1 TABLET BY MOUTH TWICE A DAY WITH A MEAL (Patient taking differently: Take 25 mg by mouth 2 (two) times daily with a meal.) 180 tablet 3  . chlorthalidone (HYGROTON) 25 MG tablet Take 1 tablet (25 mg total) by mouth daily. 30 tablet 1  . cloNIDine (CATAPRES) 0.1 MG tablet Take 1 tablet (0.1 mg total) by mouth 2 (two) times daily. 60 tablet 3  . dexamethasone (DECADRON) 4 MG tablet Take 2 tablets (8 mg total) by mouth daily. Take daily x 3 days starting the day after cisplatin chemotherapy. Take with food. 30 tablet 1  . furosemide (LASIX) 20 MG tablet TAKE 1 TABLET (20 MG TOTAL) BY MOUTH DAILY AS NEEDED FOR FLUID. 90 tablet 1  . glucose blood test strip 1 each by Other route as needed for other. Use as instructed 100 each 11  . hydrALAZINE (APRESOLINE) 50 MG tablet TAKE 1 TABLET BY MOUTH THREE TIMES A DAY (Patient taking differently: Take 50 mg by mouth 3 (three) times daily.) 270 tablet 1  . Insulin Pen Needle (B-D UF III MINI PEN NEEDLES) 31G X 5 MM MISC USE DAILY WITH INSULIN 100 each 5  . Insulin Syringe-Needle U-100 (INSULIN SYRINGE .3CC/31GX5/16") 31G X 5/16" 0.3 ML MISC Use twice daily to inject insulin. 100 each 10  . LEVEMIR FLEXTOUCH 100 UNIT/ML FlexPen INJECT 70 UNITS UNDER THE SKIN EVERY MORNING AND 40 UNITS AT BEDTIME 15 mL 3  . lidocaine-prilocaine (EMLA) cream Apply to affected area once 30 g 3  . LORazepam (ATIVAN) 0.5 MG tablet Take 1 tablet (0.5 mg total) by mouth every 6 (six) hours as needed (Nausea or vomiting). 30 tablet 0  . losartan (COZAAR) 50 MG tablet Take 1 tablet (50 mg total) by mouth daily. 90 tablet 3  . ondansetron (ZOFRAN) 8 MG tablet Take 1 tablet (8 mg total) by mouth 2 (two) times daily as needed. Start on the third day after cisplatin chemotherapy. 30 tablet 1  . OneTouch Delica Lancets 87F MISC 1 each by Does not apply route 2 (two) times daily. 200 each 11  . pioglitazone (ACTOS) 15 MG tablet Take 1 tablet (15 mg total) by  mouth daily. 90 tablet 2  . prochlorperazine (COMPAZINE) 10 MG tablet Take 1 tablet (10 mg total) by mouth every 6 (six) hours as needed (Nausea or vomiting). 30 tablet 1  . warfarin (COUMADIN) 10 MG tablet Take 1 tablet (10 mg total) by mouth daily. 90 tablet 3   Current Facility-Administered Medications  Medication Dose Route Frequency Provider Last Rate Last Admin  . 0.9 %  sodium chloride infusion   Intravenous Once [name], MD         PHYSICAL EXAMINATION:  ECOG PERFORMANCE STATUS: 0 - Asymptomatic  Vitals:   03/18/20 0919  BP: 115/62  Pulse: 69  Resp: 18  Temp: 97.8 F (36.6 C)  SpO2: 100%   Filed Weights   03/18/20 0919  Weight: 202 lb 1.6 oz (91.7 kg)    GENERAL:alert, no distress and comfortable SKIN: skin color, texture, turgor are normal, no rashes or significant lesions EYES: normal, conjunctiva are pink and non-injected, sclera clear OROPHARYNX: Hard to visualize tonsillar mass, he had a strong gag NECK: supple, thyroid normal size, non-tender, without nodularity LYMPH: Large palpable lymph nodes bilateral cervical region.  No supraclavicular lymphadenopathy LUNGS: clear to auscultation and percussion with normal breathing effort HEART: regular rate & rhythm and no murmurs and no lower extremity edema ABDOMEN:abdomen soft, non-tender and normal bowel sounds Musculoskeletal:no cyanosis of digits and no clubbing  PSYCH: alert & oriented x 3 with fluent speech NEURO: no focal motor/sensory deficits  LABORATORY DATA:  I have reviewed the data as listed Lab Results  Component Value Date   WBC 7.1 03/18/2020   HGB 12.7 (L) 03/18/2020   HCT 38.9 (L) 03/18/2020   MCV 84.4 03/18/2020   PLT 183 03/18/2020     Chemistry      Component Value Date/Time   NA 134 (L) 03/18/2020 0832   K 3.8 03/18/2020 0832   CL 101 03/18/2020 0832   CO2 23 03/18/2020 0832   BUN 29 (H) 03/18/2020 0832   CREATININE 1.44 (H) 03/18/2020 0832   CREATININE 1.41 (H) 03/15/2020  0822   CREATININE 1.23 11/30/2019 0915      Component Value Date/Time   CALCIUM 9.0 03/18/2020 0832   ALKPHOS 62 09/25/2016 0914   AST 11 11/30/2019 0915   ALT 14 11/30/2019 0915   BILITOT 0.5 11/30/2019 0915       RADIOGRAPHIC STUDIES: I have personally reviewed the radiological images as listed and agreed with the findings in the report. NM PET Image Initial (PI) Skull Base To Thigh  Result Date: 02/26/2020 CLINICAL DATA:  Initial treatment strategy for oropharyngeal carcinoma. EXAM: NUCLEAR MEDICINE PET SKULL BASE TO THIGH TECHNIQUE: 10.4 mCi F-18 FDG was injected intravenously. Full-ring PET imaging was performed from the skull base to thigh after the radiotracer. CT data was obtained and used for attenuation correction and anatomic localization. Fasting blood glucose: 4 170 mg/dl COMPARISON:  neck CT 01/28/2020 CT 09/20/2010 FINDINGS: Mediastinal blood pool activity: SUV max 3.1 Liver activity: SUV max 4.2 NECK: Intense hypermetabolic activity localizes to the RIGHT base of tongue and extends into the RIGHT lingual tonsil. Activity is confined to the mucosal layer. Activity is intense with SUV max equal 10.3 (image 48). Bilateral large bulky intensely hypermetabolic level II lymph nodes (SUV max equal 14). Nodal mass on the RIGHT measures 4.4 cm and on the LEFT 4.8 cm. Inferior to the dominant masses are there several additional small hypermetabolic level II lymph nodes on the LEFT and RIGHT at the level the hyoid. For example 8 mm LEFT inferior level II node (image 51) with SUV max equal 8.9. No more inferior hypermetabolic nodes are identified. No supraclavicular hypermetabolic nodes. Incidental CT findings: Remote infarction noted in the RIGHT parietal lobe. CHEST: No hypermetabolic mediastinal or hilar nodes. Two small pulmonary nodules in the inferior RIGHT middle lobe measure 3 mm and 4 mm on image 102 and 104 series 4. The small pulmonary nodules do not have radiotracer activity.  Additionally nodules are present on CT from 09/20/2010. Incidental CT findings: Coronary artery calcification  and aortic atherosclerotic calcification. ABDOMEN/PELVIS: No abnormal hypermetabolic activity within the liver, pancreas, adrenal glands, or spleen. No hypermetabolic lymph nodes in the abdomen or pelvis. Incidental CT findings: none SKELETON: No focal hypermetabolic activity to suggest skeletal metastasis. Incidental CT findings: Degenerative osteophytosis of the spine. IMPRESSION: 1. Hypermetabolic lesion at the RIGHT base of tongue and RIGHT tonsil consistent with oropharyngeal carcinoma. Carcinoma appears confined to the mucosal surface. 2. Bulky bilateral hypermetabolic level II metastatic lymph nodes. Smaller inferior hypermetabolic level II metastatic lymph nodes. 3. No evidence of metastatic disease in the chest, abdomen, or pelvis. 4. Two small RIGHT middle lobe pulmonary nodules are present on CT from 2012 consistent with benign etiology Electronically Signed   By: Suzy Bouchard M.D.   On: 02/26/2020 10:36   IR IMAGING GUIDED PORT INSERTION  Result Date: 03/09/2020 INDICATION: 65 year old male referred for port catheter EXAM: IMAGE GUIDED PORT CATHETER MEDICATIONS: 2 g Ancef; The antibiotic was administered within an appropriate time interval prior to skin puncture. ANESTHESIA/SEDATION: Moderate (conscious) sedation was employed during this procedure. A total of Versed 3.0 mg and Fentanyl 100 mcg was administered intravenously. Moderate Sedation Time: 20 minutes. The patient's level of consciousness and vital signs were monitored continuously by radiology nursing throughout the procedure under my direct supervision. FLUOROSCOPY TIME:  Fluoroscopy Time: 0 minutes 6 seconds (2 mGy). COMPLICATIONS: None PROCEDURE: The procedure, risks, benefits, and alternatives were explained to the patient. Questions regarding the procedure were encouraged and answered. The patient understands and consents to  the procedure. Ultrasound survey was performed with images stored and sent to PACs. The right neck and chest was prepped with chlorhexidine, and draped in the usual sterile fashion using maximum barrier technique (cap and mask, sterile gown, sterile gloves, large sterile sheet, hand hygiene and cutaneous antiseptic). Antibiotic prophylaxis was provided with 2.0g Ancef administered IV one hour prior to skin incision. Local anesthesia was attained by infiltration with 1% lidocaine without epinephrine. Ultrasound demonstrated patency of the right internal jugular vein, and this was documented with an image. Under real-time ultrasound guidance, this vein was accessed with a 21 gauge micropuncture needle and image documentation was performed. A small dermatotomy was made at the access site with an 11 scalpel. A 0.018" wire was advanced into the SVC and used to estimate the length of the internal catheter. The access needle exchanged for a 60F micropuncture vascular sheath. The 0.018" wire was then removed and a 0.035" wire advanced into the IVC. An appropriate location for the subcutaneous reservoir was selected below the clavicle and an incision was made through the skin and underlying soft tissues. The subcutaneous tissues were then dissected using a combination of blunt and sharp surgical technique and a pocket was formed. A single lumen power injectable portacatheter was then tunneled through the subcutaneous tissues from the pocket to the dermatotomy and the port reservoir placed within the subcutaneous pocket. The venous access site was then serially dilated and a peel away vascular sheath placed over the wire. The wire was removed and the port catheter advanced into position under fluoroscopic guidance. The catheter tip is positioned in the cavoatrial junction. This was documented with a spot image. The portacatheter was then tested and found to flush and aspirate well. The port was flushed with saline followed by  100 units/mL heparinized saline. The pocket was then closed in two layers using first subdermal inverted interrupted absorbable sutures followed by a running subcuticular suture. The epidermis was then sealed with Dermabond. The dermatotomy at  the venous access site was also seal with Dermabond. Patient tolerated the procedure well and remained hemodynamically stable throughout. No complications encountered and no significant blood loss encountered IMPRESSION: Status post right IJ port catheter placement. Catheter ready for use. Signed, Dulcy Fanny. Dellia Nims, RPVI [facility] Radiology Electronically Signed   By: Corrie Mckusick D.O.   On: 03/09/2020 12:45   I reviewed the image myself.  Noted hypermetabolic lesions at the base of tongue on the right side and right tonsil as well as the bilateral bulky lymphadenopathy.  02/10/2020  SURGICAL PATHOLOGY    Reason for Addendum #1: Immunohistochemistry results   Clinical History: throat cancer ([place])    FINAL MICROSCOPIC DIAGNOSIS:   A. OROPHARYNGEAL, RIGHT, BIOPSY:  - Squamous cell carcinoma  - See comment   COMMENT:   P16 immunohistochemistry is pending and will be reported in an addendum.  Dr. Tresa Moore reviewed the case and agrees with the above diagnosis. Dr.  Benjamine Mola was notified of these results on February 11, 2020.   GROSS DESCRIPTION:   The specimen is received in saline and consists of a 1.6 x 1.5 x 0.3 cm  aggregate of tan-pink soft tissue. The specimen is entirely submitted  in 1 cassette. Craig Staggers 02/10/2020)    Final Diagnosis performed by Thressa Sheller, MD.  Electronically signed  02/11/2020  Technical and / or Professional components performed at [facility]. [facility], [place] [address] [place], [place] [address].  Immunohistochemistry Technical component (if applicable) was performed  at [facility]. [address], [place],  [place], [place] [address].   IMMUNOHISTOCHEMISTRY DISCLAIMER (if applicable):  Some of these immunohistochemical stains may have been developed and the  performance characteristics determine by [facility]. Some  may not have been cleared or approved by the U.S. Food and Drug  Administration. The FDA has determined that such clearance or approval  is not necessary. This test is used for clinical purposes. It should not  be regarded as investigational or for research. This laboratory is  certified under the [place]  (CLIA-88) as qualified to perform high complexity clinical laboratory  testing. The controls stained appropriately.   ADDENDUM:   P16 immunohistochemistry is POSITIVE.   Reviewed labs. AKI  All questions were answered. The patient knows to call the clinic with any problems, questions or concerns. I spent 30 minutes in the care of this patient including H and P, review of records, counseling, documentation and coordination of care.     [name], MD 03/18/2020 10:08 AM

## 2020-03-18 NOTE — Patient Instructions (Signed)

## 2020-03-19 ENCOUNTER — Other Ambulatory Visit (HOSPITAL_COMMUNITY)
Admission: RE | Admit: 2020-03-19 | Discharge: 2020-03-19 | Disposition: A | Discharge status: Home / Self Care | Payer: Medicare Other | Source: Ambulatory Visit | Attending: Radiation Oncology | Admitting: Radiation Oncology

## 2020-03-19 DIAGNOSIS — Z20822 Contact with and (suspected) exposure to covid-19: Secondary | ICD-10-CM | POA: Insufficient documentation

## 2020-03-19 DIAGNOSIS — Z01812 Encounter for preprocedural laboratory examination: Secondary | ICD-10-CM | POA: Diagnosis not present

## 2020-03-19 LAB — SARS CORONAVIRUS 2 (TAT 6-24 HRS): SARS Coronavirus 2: NEGATIVE

## 2020-03-21 ENCOUNTER — Other Ambulatory Visit: Payer: Self-pay

## 2020-03-21 ENCOUNTER — Inpatient Hospital Stay: Payer: Medicare Other

## 2020-03-21 ENCOUNTER — Other Ambulatory Visit: Payer: Medicare Other

## 2020-03-21 ENCOUNTER — Inpatient Hospital Stay: Payer: Medicare Other | Admitting: Nutrition

## 2020-03-21 ENCOUNTER — Other Ambulatory Visit: Payer: Self-pay | Admitting: Radiation Oncology

## 2020-03-21 ENCOUNTER — Ambulatory Visit: Payer: Medicare Other | Admitting: Hematology and Oncology

## 2020-03-21 ENCOUNTER — Other Ambulatory Visit: Payer: Self-pay | Admitting: Hematology and Oncology

## 2020-03-21 ENCOUNTER — Ambulatory Visit
Admission: RE | Admit: 2020-03-21 | Discharge: 2020-03-21 | Disposition: A | Discharge status: Home / Self Care | Payer: Medicare Other | Source: Ambulatory Visit | Attending: Radiation Oncology | Admitting: Radiation Oncology

## 2020-03-21 VITALS — BP 150/80 | HR 85 | Temp 98.1°F | Resp 16

## 2020-03-21 DIAGNOSIS — C109 Malignant neoplasm of oropharynx, unspecified: Secondary | ICD-10-CM | POA: Diagnosis not present

## 2020-03-21 DIAGNOSIS — G4700 Insomnia, unspecified: Secondary | ICD-10-CM | POA: Diagnosis not present

## 2020-03-21 DIAGNOSIS — I4891 Unspecified atrial fibrillation: Secondary | ICD-10-CM | POA: Diagnosis not present

## 2020-03-21 DIAGNOSIS — E119 Type 2 diabetes mellitus without complications: Secondary | ICD-10-CM | POA: Diagnosis not present

## 2020-03-21 DIAGNOSIS — C01 Malignant neoplasm of base of tongue: Secondary | ICD-10-CM

## 2020-03-21 DIAGNOSIS — Z510 Encounter for antineoplastic radiation therapy: Secondary | ICD-10-CM | POA: Diagnosis not present

## 2020-03-21 DIAGNOSIS — N179 Acute kidney failure, unspecified: Secondary | ICD-10-CM | POA: Diagnosis not present

## 2020-03-21 DIAGNOSIS — I10 Essential (primary) hypertension: Secondary | ICD-10-CM | POA: Diagnosis not present

## 2020-03-21 DIAGNOSIS — Z5111 Encounter for antineoplastic chemotherapy: Secondary | ICD-10-CM | POA: Diagnosis not present

## 2020-03-21 DIAGNOSIS — C108 Malignant neoplasm of overlapping sites of oropharynx: Secondary | ICD-10-CM | POA: Diagnosis not present

## 2020-03-21 DIAGNOSIS — Z23 Encounter for immunization: Secondary | ICD-10-CM | POA: Diagnosis not present

## 2020-03-21 DIAGNOSIS — I2510 Atherosclerotic heart disease of native coronary artery without angina pectoris: Secondary | ICD-10-CM | POA: Diagnosis not present

## 2020-03-21 DIAGNOSIS — Z452 Encounter for adjustment and management of vascular access device: Secondary | ICD-10-CM | POA: Diagnosis not present

## 2020-03-21 LAB — BASIC METABOLIC PANEL
Anion gap: 11 (ref 5–15)
BUN: 24 mg/dL — ABNORMAL HIGH (ref 8–23)
CO2: 25 mmol/L (ref 22–32)
Calcium: 9.3 mg/dL (ref 8.9–10.3)
Chloride: 97 mmol/L — ABNORMAL LOW (ref 98–111)
Creatinine, Ser: 1.37 mg/dL — ABNORMAL HIGH (ref 0.61–1.24)
GFR, Estimated: 57 mL/min — ABNORMAL LOW (ref >60)
Glucose, Bld: 256 mg/dL — ABNORMAL HIGH (ref 70–99)
Potassium: 4 mmol/L (ref 3.5–5.1)
Sodium: 133 mmol/L — ABNORMAL LOW (ref 135–145)

## 2020-03-21 LAB — CBC WITH DIFFERENTIAL/PLATELET
Abs Immature Granulocytes: 0.03 10*3/uL (ref 0.00–0.07)
Basophils Absolute: 0 10*3/uL (ref 0.0–0.1)
Basophils Relative: 0 %
Eosinophils Absolute: 0.1 10*3/uL (ref 0.0–0.5)
Eosinophils Relative: 1 %
HCT: 36.7 % — ABNORMAL LOW (ref 39.0–52.0)
Hemoglobin: 12.3 g/dL — ABNORMAL LOW (ref 13.0–17.0)
Immature Granulocytes: 0 %
Lymphocytes Relative: 8 %
Lymphs Abs: 0.7 10*3/uL (ref 0.7–4.0)
MCH: 27.6 pg (ref 26.0–34.0)
MCHC: 33.5 g/dL (ref 30.0–36.0)
MCV: 82.3 fL (ref 80.0–100.0)
Monocytes Absolute: 0.7 10*3/uL (ref 0.1–1.0)
Monocytes Relative: 8 %
Neutro Abs: 7.4 10*3/uL (ref 1.7–7.7)
Neutrophils Relative %: 83 %
Platelets: 214 10*3/uL (ref 150–400)
RBC: 4.46 MIL/uL (ref 4.22–5.81)
RDW: 13.5 % (ref 11.5–15.5)
WBC: 8.9 10*3/uL (ref 4.0–10.5)
nRBC: 0 % (ref 0.0–0.2)

## 2020-03-21 LAB — MAGNESIUM: Magnesium: 1.6 mg/dL — ABNORMAL LOW (ref 1.7–2.4)

## 2020-03-21 MED ORDER — SODIUM CHLORIDE 0.9 % IV SOLN
10.0000 mg | Freq: Once | INTRAVENOUS | Status: AC
  Administered 2020-03-21: 10 mg via INTRAVENOUS
  Filled 2020-03-21: qty 1
  Filled 2020-03-21: qty 10

## 2020-03-21 MED ORDER — PALONOSETRON HCL INJECTION 0.25 MG/5ML
0.2500 mg | Freq: Once | INTRAVENOUS | Status: AC
  Administered 2020-03-21: 0.25 mg via INTRAVENOUS

## 2020-03-21 MED ORDER — MAGNESIUM SULFATE 2 GM/50ML IV SOLN
INTRAVENOUS | Status: AC
  Filled 2020-03-21: qty 50

## 2020-03-21 MED ORDER — SODIUM CHLORIDE 0.9 % IV SOLN
Freq: Once | INTRAVENOUS | Status: AC
  Filled 2020-03-21: qty 250

## 2020-03-21 MED ORDER — LIDOCAINE VISCOUS HCL 2 % MT SOLN: OROMUCOSAL | 4 refills | Status: DC

## 2020-03-21 MED ORDER — MAGNESIUM SULFATE 2 GM/50ML IV SOLN
2.0000 g | Freq: Once | INTRAVENOUS | Status: AC
  Administered 2020-03-21: 2 g via INTRAVENOUS

## 2020-03-21 MED ORDER — PALONOSETRON HCL INJECTION 0.25 MG/5ML
INTRAVENOUS | Status: AC
  Filled 2020-03-21: qty 5

## 2020-03-21 MED ORDER — POTASSIUM CHLORIDE IN NACL 20-0.9 MEQ/L-% IV SOLN
Freq: Once | INTRAVENOUS | Status: AC
  Filled 2020-03-21: qty 1000

## 2020-03-21 MED ORDER — ZOLPIDEM TARTRATE 5 MG PO TABS: 5.0000 mg | ORAL_TABLET | Freq: Every evening | ORAL | 0 refills | Status: DC | PRN

## 2020-03-21 MED ORDER — SONAFINE EX EMUL: 1.0000 "application " | Freq: Two times a day (BID) | CUTANEOUS | Status: DC

## 2020-03-21 MED ORDER — SODIUM CHLORIDE 0.9 % IV SOLN
30.0000 mg/m2 | Freq: Once | INTRAVENOUS | Status: AC
  Administered 2020-03-21: 65 mg via INTRAVENOUS
  Filled 2020-03-21: qty 65

## 2020-03-21 MED ORDER — SODIUM CHLORIDE 0.9% FLUSH
10.0000 mL | INTRAVENOUS | Status: DC | PRN
  Administered 2020-03-21: 10 mL
  Filled 2020-03-21: qty 10

## 2020-03-21 MED ORDER — HEPARIN SOD (PORK) LOCK FLUSH 100 UNIT/ML IV SOLN
500.0000 [IU] | Freq: Once | INTRAVENOUS | Status: AC | PRN
  Administered 2020-03-21: 500 [IU]
  Filled 2020-03-21: qty 5

## 2020-03-21 MED ORDER — SODIUM CHLORIDE 0.9 % IV SOLN
150.0000 mg | Freq: Once | INTRAVENOUS | Status: AC
  Administered 2020-03-21: 150 mg via INTRAVENOUS
  Filled 2020-03-21: qty 5
  Filled 2020-03-21: qty 150

## 2020-03-21 NOTE — Progress Notes (Signed)
Brief nutrition follow-up completed with patient during infusion for tongue cancer. Patient receiving education on feeding tube care. He reports his appetite is good and he is eating well. He continues to deny nutrition impact symptoms. Weight is decreased and documented as 202.1 pounds on January 28 down from 204 pounds January 24. Noted labs: Sodium 133, glucose 256, BUN 24, creatinine 1.37. Plan is for feeding tube placement on Wednesday this week.  Nutrition diagnosis: Inadequate oral intake continues.  Estimated nutrition needs: 2200-2500 cal, 120-135 g protein, 2.4 L fluid.  Intervention: Patient encouraged to continue small frequent meals and snacks with adequate calories and protein to minimize further weight loss. Provided 1 complementary case of Ensure plus. Encourage patient to flush feeding tube with water on a daily basis. Work to increase calories and protein but will not start tube feeding yet.  Monitoring, evaluation, goals: Patient will increase calories and protein to minimize further weight loss.  Next visit: Monday, February 7 during infusion.  **Disclaimer: This note was dictated with voice recognition software. Similar sounding words can inadvertently be transcribed and this note may contain transcription errors which may not have been corrected upon publication of note.**

## 2020-03-21 NOTE — Progress Notes (Signed)
Oncology Nurse Navigator Documentation  Met with Mr. Eshbach to provide PEG education prior to 03/23/20 placement.  Provided port educational handout, showed example, provided guidance for post-surgical dsg removal, site care.  . Using  PEG teaching device   and Teach Back, provided education for PEG use and care, including: hand hygiene, gravity bolus administration of daily water flushes and nutritional supplement, fluids and medications; care of tube insertion site including daily dressing change and cleaning; S&S of infection.   . Mr. Spiewak correctly verbalized procedures for and provided correct return demonstration of gravity administration of water, dressing change and site care.  . I provided written instructions for PEG flushing/dressing change in support of verbal instruction.   . I provided/described contents of Start of Care Bolus Feeding Kit (3 60 cc syringes, 2 boxes 4x4 drainage sponges, 1 package mesh briefs, 1 roll paper tape, 1 case Osmolite 1.5).  He voiced understanding he is to start using Osmolite per guidance of Nutrition. Marland Kitchen He understands I will be available for ongoing PEG support. I also provided barium sulfate prep which I obtained from [facility] IR and reviewed instructions.   Harlow Asa RN, BSN, OCN Head & Neck Oncology Nurse [place] at [facility] Phone # [phone]  Fax # [phone]

## 2020-03-21 NOTE — Progress Notes (Signed)
Gave him a prescription for ambien given his insomnia This is unrelated to chemotherapy since he hasnt even started it before insomnia set in. Likely situational, doesn't need to be a long term medication.

## 2020-03-21 NOTE — Progress Notes (Signed)
Pt here for patient teaching.  Pt given Radiation and You booklet, Managing Acute Radiation Side Effects for Head and Neck Cancer handout, skin care instructions, and Sonafine.  Reviewed areas of pertinence such as fatigue, hair loss, mouth changes, skin changes, throat changes, and taste changes . Pt able to give teach back of to pat skin, use unscented/gentle soap, and drink plenty of water,apply Sonafine bid, avoid applying anything to skin within 4 hours of treatment, and to use an electric razor if they must shave. Pt demonstrated understanding of information given and will contact nursing with any questions or concerns.     [email or web address]

## 2020-03-21 NOTE — Patient Instructions (Addendum)
[facility] Discharge Instructions for Patients Receiving Chemotherapy  Today you received the following chemotherapy agents: cisplatin.  To help prevent nausea and vomiting after your treatment, we encourage you to take your nausea medication as directed.   If you develop nausea and vomiting that is not controlled by your nausea medication, call the clinic.   BELOW ARE SYMPTOMS THAT SHOULD BE REPORTED IMMEDIATELY:  *FEVER GREATER THAN 100.5 F  *CHILLS WITH OR WITHOUT FEVER  NAUSEA AND VOMITING THAT IS NOT CONTROLLED WITH YOUR NAUSEA MEDICATION  *UNUSUAL SHORTNESS OF BREATH  *UNUSUAL BRUISING OR BLEEDING  TENDERNESS IN MOUTH AND THROAT WITH OR WITHOUT PRESENCE OF ULCERS  *URINARY PROBLEMS  *BOWEL PROBLEMS  UNUSUAL RASH Items with * indicate a potential emergency and should be followed up as soon as possible.  Feel free to call the clinic should you have any questions or concerns. The clinic phone number is [phone].  Please show the [facility] at check-in to the Emergency Department and triage nurse.  Cisplatin injection What is this medicine? CISPLATIN (SIS pla tin) is a chemotherapy drug. It targets fast dividing cells, like cancer cells, and causes these cells to die. This medicine is used to treat many types of cancer like bladder, ovarian, and testicular cancers. This medicine may be used for other purposes; ask your health care provider or pharmacist if you have questions. COMMON BRAND NAME(S): Platinol, Platinol -AQ What should I tell my health care provider before I take this medicine? They need to know if you have any of these conditions:  eye disease, vision problems  hearing problems  kidney disease  low blood counts, like white cells, platelets, or red blood cells  tingling of the fingers or toes, or other nerve disorder  an unusual or allergic reaction to cisplatin, carboplatin, oxaliplatin, other medicines, foods, dyes, or  preservatives  pregnant or trying to get pregnant  breast-feeding How should I use this medicine? This drug is given as an infusion into a vein. It is administered in a hospital or clinic by a specially trained health care professional. Talk to your pediatrician regarding the use of this medicine in children. Special care may be needed. Overdosage: If you think you have taken too much of this medicine contact a poison control center or emergency room at once. NOTE: This medicine is only for you. Do not share this medicine with others. What if I miss a dose? It is important not to miss a dose. Call your doctor or health care professional if you are unable to keep an appointment. What may interact with this medicine? This medicine may interact with the following medications:  foscarnet  certain antibiotics like amikacin, gentamicin, neomycin, polymyxin B, streptomycin, tobramycin, vancomycin This list may not describe all possible interactions. Give your health care provider a list of all the medicines, herbs, non-prescription drugs, or dietary supplements you use. Also tell them if you smoke, drink alcohol, or use illegal drugs. Some items may interact with your medicine. What should I watch for while using this medicine? Your condition will be monitored carefully while you are receiving this medicine. You will need important blood work done while you are taking this medicine. This drug may make you feel generally unwell. This is not uncommon, as chemotherapy can affect healthy cells as well as cancer cells. Report any side effects. Continue your course of treatment even though you feel ill unless your doctor tells you to stop. This medicine may increase your  risk of getting an infection. Call your healthcare professional for advice if you get a fever, chills, or sore throat, or other symptoms of a cold or flu. Do not treat yourself. Try to avoid being around people who are sick. Avoid taking  medicines that contain aspirin, acetaminophen, ibuprofen, naproxen, or ketoprofen unless instructed by your healthcare professional. These medicines may hide a fever. This medicine may increase your risk to bruise or bleed. Call your doctor or health care professional if you notice any unusual bleeding. Be careful brushing and flossing your teeth or using a toothpick because you may get an infection or bleed more easily. If you have any dental work done, tell your dentist you are receiving this medicine. Do not become pregnant while taking this medicine or for 14 months after stopping it. Women should inform their healthcare professional if they wish to become pregnant or think they might be pregnant. Men should not father a child while taking this medicine and for 11 months after stopping it. There is potential for serious side effects to an unborn child. Talk to your healthcare professional for more information. Do not breast-feed an infant while taking this medicine. This medicine has caused ovarian failure in some women. This medicine may make it more difficult to get pregnant. Talk to your healthcare professional if you are concerned about your fertility. This medicine has caused decreased sperm counts in some men. This may make it more difficult to father a child. Talk to your healthcare professional if you are concerned about your fertility. Drink fluids as directed while you are taking this medicine. This will help protect your kidneys. Call your doctor or health care professional if you get diarrhea. Do not treat yourself. What side effects may I notice from receiving this medicine? Side effects that you should report to your doctor or health care professional as soon as possible:  allergic reactions like skin rash, itching or hives, swelling of the face, lips, or tongue  blurred vision  changes in vision  decreased hearing or ringing of the ears  nausea, vomiting  pain, redness, or  irritation at site where injected  pain, tingling, numbness in the hands or feet  signs and symptoms of bleeding such as bloody or black, tarry stools; red or dark brown urine; spitting up blood or brown material that looks like coffee grounds; red spots on the skin; unusual bruising or bleeding from the eyes, gums, or nose  signs and symptoms of infection like fever; chills; cough; sore throat; pain or trouble passing urine  signs and symptoms of kidney injury like trouble passing urine or change in the amount of urine  signs and symptoms of low red blood cells or anemia such as unusually weak or tired; feeling faint or lightheaded; falls; breathing problems Side effects that usually do not require medical attention (report to your doctor or health care professional if they continue or are bothersome):  loss of appetite  mouth sores  muscle cramps This list may not describe all possible side effects. Call your doctor for medical advice about side effects. You may report side effects to FDA at 1-800-FDA-1088. Where should I keep my medicine? This drug is given in a hospital or clinic and will not be stored at home. NOTE: This sheet is a summary. It may not cover all possible information. If you have questions about this medicine, talk to your doctor, pharmacist, or health care provider.  2021 Elsevier/Gold Standard (2018-01-31 15:59:17)

## 2020-03-22 ENCOUNTER — Other Ambulatory Visit: Payer: Self-pay

## 2020-03-22 ENCOUNTER — Ambulatory Visit
Admission: RE | Admit: 2020-03-22 | Discharge: 2020-03-22 | Disposition: A | Discharge status: Home / Self Care | Payer: Medicare Other | Source: Ambulatory Visit | Attending: Radiation Oncology | Admitting: Radiation Oncology

## 2020-03-22 ENCOUNTER — Ambulatory Visit (INDEPENDENT_AMBULATORY_CARE_PROVIDER_SITE_OTHER): Payer: Medicare Other | Admitting: Family Medicine

## 2020-03-22 ENCOUNTER — Other Ambulatory Visit: Payer: Self-pay | Admitting: Radiology

## 2020-03-22 ENCOUNTER — Encounter: Payer: Self-pay | Admitting: Family Medicine

## 2020-03-22 VITALS — BP 150/80 | HR 76 | Temp 98.1°F | Ht 69.0 in | Wt 206.0 lb

## 2020-03-22 DIAGNOSIS — I4891 Unspecified atrial fibrillation: Secondary | ICD-10-CM | POA: Diagnosis not present

## 2020-03-22 DIAGNOSIS — E1169 Type 2 diabetes mellitus with other specified complication: Secondary | ICD-10-CM | POA: Diagnosis not present

## 2020-03-22 DIAGNOSIS — C01 Malignant neoplasm of base of tongue: Secondary | ICD-10-CM | POA: Insufficient documentation

## 2020-03-22 DIAGNOSIS — I10 Essential (primary) hypertension: Secondary | ICD-10-CM | POA: Diagnosis not present

## 2020-03-22 DIAGNOSIS — E669 Obesity, unspecified: Secondary | ICD-10-CM

## 2020-03-22 NOTE — Progress Notes (Signed)
Subjective:    Patient ID: Brian [name], male    DOB: Sep 14, 1955, 65 y.o.   MRN: [id number]  03/03/20 Since I last saw the patient, he was diagnosed with squamous cell carcinoma.  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  He met with radiation oncologist recently.  At their office his systolic blood pressure was greater than 200.  Therefore I had him come in today urgently to be reassessed.  In his left arm his blood pressure was 190/100.  In his right arm it was 170/98.  He is consistently taking losartan 50 mg a day, amlodipine 10 mg a day, carvedilol 25 mg twice a day, hydralazine 50 mg 3 times a day.  He states that he is not missing any of his medication.  He has not been taking losartan.  He is also not taking Trulicity because it was too expensive.  At that time, my plan was: Blood pressure is dangerously high.  There is no evidence of endorgan damage.  He denies any chest pain shortness of breath or dyspnea on exertion.  He denies any strokelike symptoms.  He denies any oliguria or hematuria.  Our lab person is out sick today with COVID therefore I cannot check renal function or lab work.  I will start the patient on hydrochlorothiazide 25 mg a day and recheck blood pressure next week.  He is overdue to recheck an INR at that visit.  We will need to adjust his diabetic medication based on his sugars as he starts radiation therapy for the cancer in his neck.  I anticipate that his oral intake will decrease and this will likely affect his sugars and may cause him to start to drop.  03/10/20 Patient's blood pressure today is still extremely high at 182/100.  I confirmed this myself.  He denies any chest pain shortness of breath or dyspnea on exertion.  He has been taking the chlorthalidone.  He is not checking his blood sugars.  Oncology is concerned about hyperglycemia due to the Decadron if they are going to give him with  chemotherapy.  I am concerned that he is going to experience rapidly fluctuating sugars not only due to the steroids but also due to his intake which may be affected by the radiation to treat his neck as I anticipate that his oral intake will drop.  At that time, my plan was: Add clonidine 0.1 mg twice daily and recheck blood pressure in 10 days.  He is off his Coumadin right now because he had a port placed yesterday.  He just started his Coumadin back this morning.  He will need to recheck an INR in 10 days.  Continue Levemir at his current dose however we will start the patient on sliding scale regular insulin.  He is to take 2 units of insulin for every 50 points blood sugar is above 150.  For instance between 150 and 200 he will take 2 units, between 201 and 250 he will take 4 units, between 251 and 300 he will take 6 units, etc.  Also established the patient with our clinical pharmacist for diabetic education on how to use the sliding scale and which will be the most affordable rapid acting insulin.  03/22/20 Patient was supposed to recheck his Coumadin today however he is actually been off his Coumadin for the last 4 days because they are placing a  feeding tube tomorrow.  Therefore there is no reason to check his Coumadin today.  Instead we will check his Coumadin in 2 weeks after he is resumed at.  He has been taking the clonidine twice a day.  His blood pressure today is 150/80.  I checked this personally.  He states that it was low at home and low the other day at his doctor's appointment.  Therefore his blood pressure certainly out of the dangerous range.  He has not been checking his sugars yet due to confusion about how to use the meter.  He is still taking his Levemir as prescribed but he has not been using the rapid acting insulin sliding scale Past Surgical History:  Procedure Laterality Date  . DIRECT LARYNGOSCOPY N/A 02/10/2020   Procedure: DIRECT LARYNGOSCOPY WITH BIOPSY;  Surgeon: [name],  MD;  Location: [facility];  Service: ENT;  Laterality: N/A;  . Ear surgery as a child    . IR IMAGING GUIDED PORT INSERTION  03/09/2020  . TONSILLECTOMY     Current Outpatient Medications on File Prior to Visit  Medication Sig Dispense Refill  . amLODipine (NORVASC) 10 MG tablet Take 1 tablet (10 mg total) by mouth daily. 90 tablet 1  . atorvastatin (LIPITOR) 20 MG tablet TAKE 1 TABLET (20 MG TOTAL) DAILY BY MOUTH. 90 tablet 3  . Blood Glucose Monitoring Suppl (ONETOUCH VERIO IQ SYSTEM) w/Device KIT 1 each by Does not apply route 2 (two) times daily. 1 kit 0  . carvedilol (COREG) 25 MG tablet TAKE 1 TABLET BY MOUTH TWICE A DAY WITH A MEAL (Patient taking differently: Take 25 mg by mouth 2 (two) times daily with a meal.) 180 tablet 3  . chlorthalidone (HYGROTON) 25 MG tablet Take 1 tablet (25 mg total) by mouth daily. 30 tablet 1  . cloNIDine (CATAPRES) 0.1 MG tablet Take 1 tablet (0.1 mg total) by mouth 2 (two) times daily. 60 tablet 3  . dexamethasone (DECADRON) 4 MG tablet Take 2 tablets (8 mg total) by mouth daily. Take daily x 3 days starting the day after cisplatin chemotherapy. Take with food. 30 tablet 1  . furosemide (LASIX) 20 MG tablet TAKE 1 TABLET (20 MG TOTAL) BY MOUTH DAILY AS NEEDED FOR FLUID. 90 tablet 1  . glucose blood test strip 1 each by Other route as needed for other. Use as instructed 100 each 11  . hydrALAZINE (APRESOLINE) 50 MG tablet TAKE 1 TABLET BY MOUTH THREE TIMES A DAY (Patient taking differently: Take 50 mg by mouth 3 (three) times daily.) 270 tablet 1  . Insulin Pen Needle (B-D UF III MINI PEN NEEDLES) 31G X 5 MM MISC USE DAILY WITH INSULIN 100 each 5  . Insulin Syringe-Needle U-100 (INSULIN SYRINGE .3CC/31GX5/16") 31G X 5/16" 0.3 ML MISC Use twice daily to inject insulin. 100 each 10  . LEVEMIR FLEXTOUCH 100 UNIT/ML FlexPen INJECT 70 UNITS UNDER THE SKIN EVERY MORNING AND 40 UNITS AT BEDTIME 15 mL 3  . lidocaine (XYLOCAINE) 2 % solution Patient: Mix 1part 2% viscous  lidocaine, 1part H20. Swish & swallow 69m of diluted mixture, 386m before meals and at bedtime, up to QID 200 mL 4  . lidocaine-prilocaine (EMLA) cream Apply to affected area once 30 g 3  . LORazepam (ATIVAN) 0.5 MG tablet Take 1 tablet (0.5 mg total) by mouth every 6 (six) hours as needed (Nausea or vomiting). 30 tablet 0  . losartan (COZAAR) 50 MG tablet Take 1 tablet (50 mg total) by  mouth daily. 90 tablet 3  . ondansetron (ZOFRAN) 8 MG tablet Take 1 tablet (8 mg total) by mouth 2 (two) times daily as needed. Start on the third day after cisplatin chemotherapy. 30 tablet 1  . OneTouch Delica Lancets 74M MISC 1 each by Does not apply route 2 (two) times daily. 200 each 11  . pioglitazone (ACTOS) 15 MG tablet Take 1 tablet (15 mg total) by mouth daily. 90 tablet 2  . prochlorperazine (COMPAZINE) 10 MG tablet Take 1 tablet (10 mg total) by mouth every 6 (six) hours as needed (Nausea or vomiting). 30 tablet 1  . warfarin (COUMADIN) 10 MG tablet Take 1 tablet (10 mg total) by mouth daily. 90 tablet 3  . zolpidem (AMBIEN) 5 MG tablet Take 1 tablet (5 mg total) by mouth at bedtime as needed for sleep. 30 tablet 0   No current facility-administered medications on file prior to visit.   Allergies  Allergen Reactions  . Codeine Other (See Comments)    Unknown reaction, severe headach   Social History   Socioeconomic History  . Marital status: Divorced    Spouse name: Not on file  . Number of children: 2  . Years of education: Not on file  . Highest education level: Not on file  Occupational History  . Occupation: Disabled  Tobacco Use  . Smoking status: Never Smoker  . Smokeless tobacco: Never Used  Vaping Use  . Vaping Use: Never used  Substance and Sexual Activity  . Alcohol use: Yes    Alcohol/week: 0.0 standard drinks    Comment: seldom  . Drug use: Yes    Frequency: 7.0 times per week    Comment: marijauna for arthritis  . Sexual activity: Not Currently  Other Topics Concern   . Not on file  Social History Narrative   He has children.   Social Determinants of Health   Financial Resource Strain: Low Risk   . Difficulty of Paying Living Expenses: Not very hard  Food Insecurity: No Food Insecurity  . Worried About Charity fundraiser in the Last Year: Never true  . Ran Out of Food in the Last Year: Never true  Transportation Needs: No Transportation Needs  . [name] of Transportation (Medical): No  . [name] of Transportation (Non-Medical): No  Physical Activity: Not on file  Stress: Stress Concern Present  . Feeling of Stress : To some extent  Social Connections: Socially Isolated  . Frequency of Communication with Friends and Family: More than three times a week  . Frequency of Social Gatherings with Friends and Family: More than three times a week  . Attends Religious Services: Never  . Active Member of Clubs or Organizations: No  . Attends Archivist Meetings: Never  . Marital Status: Divorced  Human resources officer Violence: Not on file    Review of Systems  All other systems reviewed and are negative.      Objective:   Physical Exam Vitals reviewed.  HENT:     Head:     Salivary Glands: Left salivary gland is diffusely enlarged and tender.   Neck:   Cardiovascular:     Rate and Rhythm: Normal rate and regular rhythm.     Heart sounds: Normal heart sounds.  Pulmonary:     Effort: Pulmonary effort is normal. No respiratory distress.     Breath sounds: Normal breath sounds. No wheezing or rales.  Chest:     Chest wall: No tenderness.  Abdominal:  General: Bowel sounds are normal. There is no distension.     Palpations: Abdomen is soft.     Tenderness: There is no abdominal tenderness. There is no rebound.  Musculoskeletal:     Cervical back: Muscular tenderness present.     Right lower leg: No edema.     Left lower leg: No edema.  Lymphadenopathy:     Cervical: Cervical adenopathy present.     Right cervical: Superficial  cervical adenopathy present.     Left cervical: Superficial cervical adenopathy present.           Assessment & Plan:  Atrial fibrillation, unspecified type ([place]) - Plan: PT with INR/Fingerstick  Essential hypertension, benign  Type 2 diabetes mellitus with obesity ([place])  Blood pressure is better but not at goal.  However it sounds like his blood pressure may be lower at home.  Therefore of asked the patient to check his blood pressure every day and notify me of the results later this week.  We can increase the clonidine to 0.2 twice a day if necessary.  Patient had diabetic education today with my nurse until he felt comfortable using his meter.  He will take the Levemir twice a day as prescribed and he will start using sliding scale insulin correction with rapid insulin based on his blood sugars before meals and bedtime.  Recheck INR in 2 weeks.

## 2020-03-23 ENCOUNTER — Ambulatory Visit: Payer: Medicare Other

## 2020-03-23 ENCOUNTER — Ambulatory Visit
Admission: RE | Admit: 2020-03-23 | Discharge: 2020-03-23 | Disposition: A | Discharge status: Home / Self Care | Payer: Medicare Other | Source: Ambulatory Visit | Attending: Radiation Oncology | Admitting: Radiation Oncology

## 2020-03-23 ENCOUNTER — Encounter (HOSPITAL_COMMUNITY): Payer: Self-pay

## 2020-03-23 ENCOUNTER — Ambulatory Visit (HOSPITAL_COMMUNITY)
Admission: RE | Admit: 2020-03-23 | Discharge: 2020-03-23 | Disposition: A | Discharge status: Home / Self Care | Payer: Medicare Other | Source: Ambulatory Visit | Attending: Hematology and Oncology | Admitting: Hematology and Oncology

## 2020-03-23 DIAGNOSIS — C01 Malignant neoplasm of base of tongue: Secondary | ICD-10-CM | POA: Diagnosis not present

## 2020-03-23 DIAGNOSIS — Z885 Allergy status to narcotic agent status: Secondary | ICD-10-CM | POA: Diagnosis not present

## 2020-03-23 DIAGNOSIS — Z79899 Other long term (current) drug therapy: Secondary | ICD-10-CM | POA: Insufficient documentation

## 2020-03-23 DIAGNOSIS — C109 Malignant neoplasm of oropharynx, unspecified: Secondary | ICD-10-CM | POA: Insufficient documentation

## 2020-03-23 HISTORY — PX: IR GASTROSTOMY TUBE MOD SED: IMG625

## 2020-03-23 LAB — GLUCOSE, CAPILLARY
Glucose-Capillary: 84 mg/dL (ref 70–99)
Glucose-Capillary: 59 mg/dL — ABNORMAL LOW (ref 70–99)
Glucose-Capillary: 120 mg/dL — ABNORMAL HIGH (ref 70–99)
Glucose-Capillary: 83 mg/dL (ref 70–99)
Glucose-Capillary: 121 mg/dL — ABNORMAL HIGH (ref 70–99)
Glucose-Capillary: 103 mg/dL — ABNORMAL HIGH (ref 70–99)
Glucose-Capillary: 134 mg/dL — ABNORMAL HIGH (ref 70–99)

## 2020-03-23 LAB — PROTIME-INR
INR: 1.2 (ref 0.8–1.2)
Prothrombin Time: 15.1 seconds (ref 11.4–15.2)

## 2020-03-23 MED ORDER — CEFAZOLIN SODIUM-DEXTROSE 2-4 GM/100ML-% IV SOLN: 2.0000 g | INTRAVENOUS | Status: AC

## 2020-03-23 MED ORDER — DEXTROSE 50 % IV SOLN
INTRAVENOUS | Status: AC
  Filled 2020-03-23: qty 50

## 2020-03-23 MED ORDER — FENTANYL CITRATE (PF) 100 MCG/2ML IJ SOLN
INTRAMUSCULAR | Status: AC
  Filled 2020-03-23: qty 2

## 2020-03-23 MED ORDER — IOHEXOL 300 MG/ML  SOLN
50.0000 mL | Freq: Once | INTRAMUSCULAR | Status: AC | PRN
  Administered 2020-03-23: 15 mL

## 2020-03-23 MED ORDER — DEXTROSE 50 % IV SOLN
1.0000 | Freq: Once | INTRAVENOUS | Status: AC
  Administered 2020-03-23 (×2): 25 mL via INTRAVENOUS

## 2020-03-23 MED ORDER — LIDOCAINE HCL 1 % IJ SOLN
INTRAMUSCULAR | Status: AC
  Filled 2020-03-23: qty 20

## 2020-03-23 MED ORDER — DEXTROSE 50 % IV SOLN
25.0000 mL | Freq: Once | INTRAVENOUS | Status: AC
  Administered 2020-03-23: 25 mL via INTRAVENOUS
  Filled 2020-03-23: qty 50

## 2020-03-23 MED ORDER — MIDAZOLAM HCL 2 MG/2ML IJ SOLN
INTRAMUSCULAR | Status: AC | PRN
  Administered 2020-03-23: 2 mg via INTRAVENOUS

## 2020-03-23 MED ORDER — CEFAZOLIN SODIUM-DEXTROSE 2-4 GM/100ML-% IV SOLN
INTRAVENOUS | Status: AC
  Administered 2020-03-23: 2 g via INTRAVENOUS
  Filled 2020-03-23: qty 100

## 2020-03-23 MED ORDER — FENTANYL CITRATE (PF) 100 MCG/2ML IJ SOLN
INTRAMUSCULAR | Status: AC | PRN
  Administered 2020-03-23: 50 ug via INTRAVENOUS

## 2020-03-23 MED ORDER — DEXTROSE 5 % IV SOLN: INTRAVENOUS | Status: DC

## 2020-03-23 MED ORDER — MIDAZOLAM HCL 2 MG/2ML IJ SOLN
INTRAMUSCULAR | Status: AC
  Filled 2020-03-23: qty 4

## 2020-03-23 MED ORDER — LIDOCAINE HCL (PF) 1 % IJ SOLN
INTRAMUSCULAR | Status: AC | PRN
  Administered 2020-03-23: 10 mL via INTRADERMAL

## 2020-03-23 MED ORDER — SODIUM CHLORIDE 0.9 % IV SOLN: INTRAVENOUS | Status: DC

## 2020-03-23 NOTE — H&P (Signed)
Referring Physician(s): [name]  Supervising Physician: Ruthann Cancer  Patient Status:  [facility]  Chief Complaint:  "I'm getting a stomach tube"  Subjective: Patient familiar to IR service from Port-A-Cath placement on 03/09/2020. He is a 65 y.o. male with past medical history significant for atrial fibrillation, cardiomyopathy, prior CVA in 2012, diabetes, hyperlipidemia, hypertension, and now with newly diagnosed oropharyngeal cancer.  He presents today for  gastrostomy tube placement while undergoing chemoradiation.  He currently denies fever, headache, chest pain, dyspnea, cough, abdominal/back pain, nausea, vomiting or bleeding.  Past Medical History:  Diagnosis Date  . Arthritis   . Atrial fibrillation ([place])   . Cardiomyopathy   . Cerebrovascular accident ([place])    2012  . Diabetes mellitus   . Hyperlipidemia   . Hypertension   . Left-sided weakness   . 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 Zigmund Daniel, laser therapy OD  . Seizures ([place])    pt reports this is from low blood sugar   Past Surgical History:  Procedure Laterality Date  . DIRECT LARYNGOSCOPY N/A 02/10/2020   Procedure: DIRECT LARYNGOSCOPY WITH BIOPSY;  Surgeon: [name], MD;  Location: [facility];  Service: ENT;  Laterality: N/A;  . Ear surgery as a child    . IR IMAGING GUIDED PORT INSERTION  03/09/2020  . TONSILLECTOMY         Allergies: Codeine  Medications: Prior to Admission medications   Medication Sig Start Date End Date Taking? Authorizing Provider  amLODipine (NORVASC) 10 MG tablet Take 1 tablet (10 mg total) by mouth daily. 07/21/19  Yes [name], MD  carvedilol (COREG) 25 MG tablet TAKE 1 TABLET BY MOUTH TWICE A DAY WITH A MEAL Patient taking differently: Take 25 mg by mouth 2 (two) times daily with a meal. 01/28/20  Yes [name], MD  chlorthalidone (HYGROTON) 25 MG tablet Take 1 tablet (25 mg total) by mouth daily. 03/03/20  Yes [name], MD  cloNIDine  (CATAPRES) 0.1 MG tablet Take 1 tablet (0.1 mg total) by mouth 2 (two) times daily. 03/10/20  Yes [name], MD  dexamethasone (DECADRON) 4 MG tablet Take 2 tablets (8 mg total) by mouth daily. Take daily x 3 days starting the day after cisplatin chemotherapy. Take with food. 03/05/20  Yes Iruku, Arletha Pili, MD  furosemide (LASIX) 20 MG tablet TAKE 1 TABLET (20 MG TOTAL) BY MOUTH DAILY AS NEEDED FOR FLUID. 03/24/19  Yes [name], MD  hydrALAZINE (APRESOLINE) 50 MG tablet TAKE 1 TABLET BY MOUTH THREE TIMES A DAY Patient taking differently: Take 50 mg by mouth 3 (three) times daily. 05/25/19  Yes [name], MD  LEVEMIR FLEXTOUCH 100 UNIT/ML FlexPen INJECT 70 UNITS UNDER THE SKIN EVERY MORNING AND 40 UNITS AT BEDTIME 02/22/20  Yes [name], MD  losartan (COZAAR) 50 MG tablet Take 1 tablet (50 mg total) by mouth daily. 09/14/19  Yes [name], MD  pioglitazone (ACTOS) 15 MG tablet Take 1 tablet (15 mg total) by mouth daily. 12/01/19  Yes [name], MD  zolpidem (AMBIEN) 5 MG tablet Take 1 tablet (5 mg total) by mouth at bedtime as needed for sleep. 03/21/20  Yes Iruku, Arletha Pili, MD  atorvastatin (LIPITOR) 20 MG tablet TAKE 1 TABLET (20 MG TOTAL) DAILY BY MOUTH. 02/22/20   [name], MD  Blood Glucose Monitoring Suppl (ONETOUCH VERIO IQ SYSTEM) w/Device KIT 1 each by Does not apply route 2 (two)  times daily. 03/10/20   [name], MD  glucose blood test strip 1 each by Other route as needed for other. Use as instructed 03/10/20   [name], MD  Insulin Pen Needle (B-D UF III MINI PEN NEEDLES) 31G X 5 MM MISC USE DAILY WITH INSULIN 04/10/19   [name], MD  Insulin Syringe-Needle U-100 (INSULIN SYRINGE .3CC/31GX5/16") 31G X 5/16" 0.3 ML MISC Use twice daily to inject insulin. 01/10/15   [name], MD  lidocaine (XYLOCAINE) 2 % solution Patient: Mix 1part 2% viscous lidocaine, 1part H20. Swish & swallow 12m of diluted mixture, 367m  before meals and at bedtime, up to QID 03/21/20   SqEppie GibsonMD  lidocaine-prilocaine (EMLA) cream Apply to affected area once 03/05/20   IrBenay PikeMD  LORazepam (ATIVAN) 0.5 MG tablet Take 1 tablet (0.5 mg total) by mouth every 6 (six) hours as needed (Nausea or vomiting). 03/05/20   IrBenay PikeMD  ondansetron (ZOFRAN) 8 MG tablet Take 1 tablet (8 mg total) by mouth 2 (two) times daily as needed. Start on the third day after cisplatin chemotherapy. 03/05/20   IrBenay PikeMD  OneTouch Delica Lancets 3367EISC 1 each by Does not apply route 2 (two) times daily. 03/10/20   PiSusy FrizzleMD  prochlorperazine (COMPAZINE) 10 MG tablet Take 1 tablet (10 mg total) by mouth every 6 (six) hours as needed (Nausea or vomiting). 03/05/20   IrBenay PikeMD  warfarin (COUMADIN) 10 MG tablet Take 1 tablet (10 mg total) by mouth daily. 07/01/19   PiSusy FrizzleMD     Vital Signs: BP 135/79   Pulse 61   Temp 97.7 F (36.5 C)   Resp 20   SpO2 99%   Physical Exam awake, alert.  Chest clear to auscultation bilaterally.  Clean, intact right chest wall Port-A-Cath. Heart with regular rate and rhythm.  Abdomen soft, positive bowel sounds, nontender.  No significant lower extremity edema.  Bilateral cervical lymphadenopathy.  Imaging: No results found.  Labs:  CBC: Recent Labs    03/09/20 0808 03/14/20 0900 03/18/20 0832 03/21/20 0806  WBC 8.5 7.9 7.1 8.9  HGB 13.7 12.1* 12.7* 12.3*  HCT 41.7 37.6* 38.9* 36.7*  PLT 244 222 183 214    COAGS: Recent Labs    01/18/20 1159 02/08/20 1430 03/09/20 0808 03/18/20 0832  INR 2.2* 1.0 1.1 2.3*    BMP: Recent Labs    08/03/19 0911 11/30/19 0915 02/08/20 1430 03/14/20 0900 03/15/20 0822 03/18/20 0832 03/21/20 0830  NA 138 138   < > 136 135 134* 133*  K 4.3 4.3   < > 4.4 4.1 3.8 4.0  CL 105 104   < > 101 101 101 97*  CO2 24 26   < > 24 25 23 25   GLUCOSE 217* 196*   < > 223* 192* 242* 256*  BUN 21 20   < > 42* 30*  29* 24*  CALCIUM 9.1 8.9   < > 9.2 9.6 9.0 9.3  CREATININE 1.32* 1.23   < > 1.97* 1.41* 1.44* 1.37*  GFRNONAA 56* 61   < > 37* 55* 54* 57*  GFRAA 65 70  --   --   --   --   --    < > = values in this interval not displayed.    LIVER FUNCTION TESTS: Recent Labs    08/03/19 0911 11/30/19 0915  BILITOT 0.4 0.5  AST 10 11  ALT  12 14  PROT 6.8 6.4    Assessment and Plan: Patient familiar to IR service from Port-A-Cath placement on 03/09/2020. He is a 65 y.o. male with past medical history significant for atrial fibrillation, cardiomyopathy, prior CVA in 2012, diabetes, hyperlipidemia, hypertension, and now with newly diagnosed oropharyngeal cancer.  He presents today for  gastrostomy tube placement while undergoing chemoradiation.Risks and benefits of image guided port-a-catheter placement was discussed with the patient including, but not limited to bleeding, infection, pneumothorax, or fibrin sheath development and need for additional procedures.  All of the patient's questions were answered, patient is agreeable to proceed. Consent signed and in chart.     Electronically Signed: D. Rowe Robert, PA-C 03/23/2020, 10:28 AM   I spent a total of 25 minutes at the the patient's bedside AND on the patient's hospital floor or unit, greater than 50% of which was counseling/coordinating care for gastrostomy tube placement

## 2020-03-23 NOTE — Discharge Instructions (Addendum)
Per Dr Serafina Royals - At 5pm procedure day, May start clear liquids by mouth and use port.   Urgent needs or inability to keep liquids and foods down  - contact  Interventional Radiology on call MD [phone]  Wound - as instructed, starting Thurs 03/24/20 Cleanse site with soap and water, dress with triple-antibiotic ointment, and cover with gauze for 7 days. Do not use scissors near the catheter. Do not submerge in water.   Gastrostomy Tube Home Guide, Adult A gastrostomy tube, or G-tube, is a tube that is inserted through the abdomen into the stomach. The tube is used to give feedings and medicines when a person cannot eat and drink enough on his or her own or take medicines by mouth. How to care for the insertion site Supplies needed:  Saline solution or clean, warm water and soap. Saline solution is made of salt and water.  Cotton swab or gauze.  Pre-cut gauze bandage (dressing) and tape, if needed. Instructions Follow these steps daily to clean the insertion site: 1. Wash your hands with soap and water for at least 20 seconds. 2. Remove the dressing (if there is one) that is between the person's skin and the tube. 3. Check the area where the tube enters the skin. Check daily for problems such as: ? Redness, rash, or irritation. ? Swelling. ? Pus-like drainage. ? Extra skin growth. 4. Moisten the cotton swab or gauze with the saline solution or with a soap-and-water mixture. Gently clean around the insertion site. Remove any drainage or crusted material. ? When the G-tube is first put in, a normal saline solution or water can be used to clean the skin. ? After the skin around the tube has healed, mild soap and water may be used. 5. Apply a dressing (if there should be one) between the person's skin and the tube.   How to flush a G-tube Flush the G-tube regularly to keep it from clogging. Flush it before and after feedings and as often as told by the health care provider. Supplies  needed:  Purified or germ-free (sterile) water, warmed.  Container with lid for boiling water, if needed.  60 cc G-tube syringe. Instructions Before you begin, decide whether to use sterile water or purified drinking water.  Use only sterile water if: ? The person has a weak disease-fighting (immune) system. ? The person has trouble fighting off infections (is immunocompromised). ? You are unsure about the amount of chemical contaminants in purified or drinking water.  Use purified drinking water in all other cases. To purify drinking water by boiling: ? Boil water for at least 1 minute. Keep lid over water while it boils. ? Let water cool to room temperature before using. Follow these steps to flush the G-tube: 1. Wash your hands with soap and water for at least 20 seconds. 2. Bring out (draw up) 30 mL of warm water in a syringe. 3. Connect the syringe to the tube. 4. Slowly and gently push the water into the tube. General tips  If the tube comes out: ? Cover the opening with a clean dressing and tape. ? Get help right away.  If there is skin or scar tissue growing where the tube enters the skin: ? Keep the area clean and dry. ? Secure the tube with tape so that the tube does not move around too much.  If the tube gets clogged: ? Slowly push warm water into the tube with a large syringe. ? Do not force  the fluid into the tube or push an object into the tube. ? Get help right away if you cannot unclog the tube. Follow these instructions at home: Feedings  Give feedings at room temperature.  If feedings are continuous: ? Do not put more than 4 hours' worth of feedings in the feeding bag. ? Stop the feedings when you need to give medicine or flush the tube. Be sure to restart the feedings. ? Make sure the person's head is above his or her stomach (upright position). This will prevent choking and discomfort.  Make sure the person is in the right position during and after  feedings. ? During feedings, have the person in the upright position. ? After a non-continuous feeding (bolus feeding), have the person stay in the upright position for 1 hour.  Cover and place unused feedings in the refrigerator.  Replace feeding bags and syringes as told. Good hygiene  Make sure the person takes good care of his or her mouth and teeth (oral hygiene), such as by brushing his or her teeth.  Keep the area where the tube enters the skin clean and dry. General instructions  Use syringes made only for G-tubes.  Do not pull or put tension on the tube.  Before you remove the tube cap or disconnect a syringe, close the tube by using a clamp (clamping) or bending (kinking) the tube.  Measure the length of the G-tube every day from the insertion site to the end of the tube.  If the person's G-tube has a balloon, check the fluid in the balloon every week. Check the manufacturer's specifications to find the amount of fluid that should be in the balloon.  Remove excess air from the G-tube as told. This is called venting.  Do not push feedings, medicines, or flushes fast. Contact a health care provider if:  The person with the tube has constipation or a fever.  A large amount of fluid or mucus-like liquid is leaking from the tube.  Skin or scar tissue appears to be growing where the tube enters the skin.  The length of tube from the insertion site to the G-tube gets longer. Get help right away if:  The person with the tube has any of these problems: ? Severe pain, tenderness, or bloating in the abdomen. ? Nausea or vomiting. ? Trouble breathing or shortness of breath.  Any of these problems happen in the area where the tube enters the skin: ? Redness, irritation, swelling, or soreness. ? Pus-like discharge. ? A bad smell.  The tube is clogged and cannot be flushed.  The tube comes out. The tube will need to put back in within 4 hours. Summary  A gastrostomy  tube, or G-tube, is a tube that is inserted through the abdomen into the stomach. The tube is used to give feedings and medicines when a person cannot eat and drink enough on his or her own or cannot take medicine by mouth.  Check and clean the insertion site daily as told by the person's health care provider.  Flush the G-tube regularly to keep it from clogging. Flush it before and after feedings and as often as told.  Keep the area where the tube enters the skin clean and dry. This information is not intended to replace advice given to you by your health care provider. Make sure you discuss any questions you have with your health care provider. Document Revised: 06/22/2019 Document Reviewed: 06/25/2019 Elsevier Patient Education  [place].  Moderate Conscious Sedation, Adult, Care After This sheet gives you information about how to care for yourself after your procedure. Your health care provider may also give you more specific instructions. If you have problems or questions, contact your health care provider. What can I expect after the procedure? After the procedure, it is common to have:  Sleepiness for several hours.  Impaired judgment for several hours.  Difficulty with balance.  Vomiting if you eat too soon. Follow these instructions at home: For the time period you were told by your health care provider:  Rest.  Do not participate in activities where you could fall or become injured.  Do not drive or use machinery.  Do not drink alcohol.  Do not take sleeping pills or medicines that cause drowsiness.  Do not make important decisions or sign legal documents.  Do not take care of children on your own.      Eating and drinking  Follow the diet recommended by your health care provider.  Drink enough fluid to keep your urine pale yellow.  If you vomit: ? Drink water, juice, or soup when you can drink without vomiting. ? Make sure you have little or no  nausea before eating solid foods.   General instructions  Take over-the-counter and prescription medicines only as told by your health care provider.  Have a responsible adult stay with you for the time you are told. It is important to have someone help care for you until you are awake and alert.  Do not smoke.  Keep all follow-up visits as told by your health care provider. This is important. Contact a health care provider if:  You are still sleepy or having trouble with balance after 24 hours.  You feel light-headed.  You keep feeling nauseous or you keep vomiting.  You develop a rash.  You have a fever.  You have redness or swelling around the IV site. Get help right away if:  You have trouble breathing.  You have new-onset confusion at home. Summary  After the procedure, it is common to feel sleepy, have impaired judgment, or feel nauseous if you eat too soon.  Rest after you get home. Know the things you should not do after the procedure.  Follow the diet recommended by your health care provider and drink enough fluid to keep your urine pale yellow.  Get help right away if you have trouble breathing or new-onset confusion at home. This information is not intended to replace advice given to you by your health care provider. Make sure you discuss any questions you have with your health care provider. Document Revised: 06/05/2019 Document Reviewed: 01/01/2019 Elsevier Patient Education  2021 [employer].

## 2020-03-23 NOTE — Procedures (Signed)
Interventional Radiology Procedure Note  Procedure: Placement of percutaneous 88F gastrostomy tube.  Complications: None  Recommendations: - NPO except for sips and chips remainder of today and overnight - May advance diet as tolerated and begin using tube tomorrow morning   Ruthann Cancer, MD Pager: [phone]

## 2020-03-23 NOTE — Progress Notes (Signed)
Per Dr Serafina Royals, discharge instructions modified to address Brian [name]'s blood sugar trends due to taking all of am insulin dose. Additional 1/2 amp Dextrose 50% given prior to discharge and Brian [name] and his driver [name] verbalize understanding to check CBG upon arrival to home and frequently thereafter. He states he has glucose tablets at home to take as necessary and understands instructions that he may consume clear liquids PO or per tube at 5pm as tolerated and eat tomorrow.  Brian [name] and Mr Selinda Eon deny further questions prior to discharge and acknowledge on call MD number to use if needed.

## 2020-03-24 ENCOUNTER — Inpatient Hospital Stay (HOSPITAL_BASED_OUTPATIENT_CLINIC_OR_DEPARTMENT_OTHER): Payer: Medicare Other | Admitting: Hematology and Oncology

## 2020-03-24 ENCOUNTER — Inpatient Hospital Stay: Payer: Medicare Other

## 2020-03-24 ENCOUNTER — Ambulatory Visit: Payer: Medicare Other | Attending: Radiation Oncology

## 2020-03-24 ENCOUNTER — Other Ambulatory Visit: Payer: Self-pay

## 2020-03-24 ENCOUNTER — Ambulatory Visit
Admission: RE | Admit: 2020-03-24 | Discharge: 2020-03-24 | Disposition: A | Discharge status: Home / Self Care | Payer: Medicare Other | Source: Ambulatory Visit | Attending: Radiation Oncology | Admitting: Radiation Oncology

## 2020-03-24 ENCOUNTER — Inpatient Hospital Stay: Payer: Medicare Other | Attending: Hematology and Oncology

## 2020-03-24 ENCOUNTER — Ambulatory Visit: Payer: Medicare Other

## 2020-03-24 ENCOUNTER — Encounter: Payer: Self-pay | Admitting: Hematology and Oncology

## 2020-03-24 ENCOUNTER — Ambulatory Visit: Payer: Medicare Other | Admitting: Physical Therapy

## 2020-03-24 ENCOUNTER — Encounter: Payer: Self-pay | Admitting: Physical Therapy

## 2020-03-24 VITALS — BP 131/76 | HR 70 | Temp 97.0°F | Resp 19 | Ht 69.0 in | Wt 204.1 lb

## 2020-03-24 DIAGNOSIS — R1310 Dysphagia, unspecified: Secondary | ICD-10-CM | POA: Diagnosis not present

## 2020-03-24 DIAGNOSIS — N179 Acute kidney failure, unspecified: Secondary | ICD-10-CM | POA: Diagnosis not present

## 2020-03-24 DIAGNOSIS — Z5111 Encounter for antineoplastic chemotherapy: Secondary | ICD-10-CM | POA: Insufficient documentation

## 2020-03-24 DIAGNOSIS — C109 Malignant neoplasm of oropharynx, unspecified: Secondary | ICD-10-CM | POA: Insufficient documentation

## 2020-03-24 DIAGNOSIS — C01 Malignant neoplasm of base of tongue: Secondary | ICD-10-CM | POA: Diagnosis not present

## 2020-03-24 DIAGNOSIS — Z23 Encounter for immunization: Secondary | ICD-10-CM | POA: Diagnosis not present

## 2020-03-24 DIAGNOSIS — R293 Abnormal posture: Secondary | ICD-10-CM | POA: Insufficient documentation

## 2020-03-24 DIAGNOSIS — G4700 Insomnia, unspecified: Secondary | ICD-10-CM | POA: Diagnosis not present

## 2020-03-24 DIAGNOSIS — I4891 Unspecified atrial fibrillation: Secondary | ICD-10-CM | POA: Insufficient documentation

## 2020-03-24 DIAGNOSIS — I2510 Atherosclerotic heart disease of native coronary artery without angina pectoris: Secondary | ICD-10-CM | POA: Diagnosis not present

## 2020-03-24 DIAGNOSIS — I10 Essential (primary) hypertension: Secondary | ICD-10-CM | POA: Diagnosis not present

## 2020-03-24 DIAGNOSIS — E119 Type 2 diabetes mellitus without complications: Secondary | ICD-10-CM | POA: Diagnosis not present

## 2020-03-24 NOTE — Progress Notes (Signed)
Oncology Nurse Navigator Documentation  I met with Brian [name] during [facility] today. I assisted him to change the dressing and flush his PEG tube for the first time. I explained the process again and watched as patient demonstrated the process. I then assisted back upstairs for his Medical Oncology appointments.   Harlow Asa RN, BSN, OCN Head & Neck Oncology Nurse [place] [facility] Phone # [phone]  Fax # [phone]

## 2020-03-24 NOTE — Therapy (Signed)
[place], [name], [address] Phone: [phone]   Fax:  [phone]  Physical Therapy Evaluation  Patient Details  Name: Brian [name] MRN: [id number] Date of Birth: 27-Dec-1955 Referring Provider (PT): Dr. Isidore Moos   Encounter Date: 03/24/2020   PT End of Session - 03/24/20 0835    Visit Number 1    Number of Visits 2    Date for PT Re-Evaluation 05/19/20    PT Start Time 1006    PT Stop Time 1884   pt in bathroom for 10 min   PT Time Calculation (min) 38 min    Activity Tolerance Patient tolerated treatment well    Behavior During Therapy [facility] for tasks assessed/performed           Past Medical History:  Diagnosis Date  . Arthritis   . Atrial fibrillation ([place])   . Cardiomyopathy   . Cerebrovascular accident ([place])    2012  . Diabetes mellitus   . Hyperlipidemia   . Hypertension   . Left-sided weakness   . 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 Zigmund Daniel, laser therapy OD  . Seizures ([place])    pt reports this is from low blood sugar    Past Surgical History:  Procedure Laterality Date  . DIRECT LARYNGOSCOPY N/A 02/10/2020   Procedure: DIRECT LARYNGOSCOPY WITH BIOPSY;  Surgeon: [name], MD;  Location: [place];  Service: ENT;  Laterality: N/A;  . Ear surgery as a child    . IR GASTROSTOMY TUBE MOD SED  03/23/2020  . IR IMAGING GUIDED PORT INSERTION  03/09/2020  . TONSILLECTOMY      There were no vitals filed for this visit.    Subjective Assessment - 03/24/20 0820    Subjective I have been better.    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 No/denies    Pain Score 0-No pain              [facility] PT Assessment - 03/24/20 0001      Assessment   Medical Diagnosis R oropharynx cancer    Referring Provider (PT) Dr. Isidore Moos    Onset Date/Surgical Date 01/28/20    Hand Dominance Right    Prior Therapy none      Precautions   Precautions Other (comment)    Precaution Comments active cancer      Restrictions   Weight Bearing Restrictions No      Balance Screen   Has the patient fallen in the past 6 months No    Has the patient had a decrease in activity level because of a fear of falling?  No    Is the patient reluctant to leave their home because of a fear of falling?  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    Leisure pt does not currently exercise      Cognition   Overall Cognitive Status Within Functional Limits for tasks assessed      Functional Tests   Functional tests Sit to Stand      Sit to Stand   Comments 10 sit to stands in 30 seconds with some fatigue which is below average for his age      Posture/Postural Control   Posture/Postural Control Postural limitations    Postural Limitations Rounded Shoulders;Forward head      ROM / Strength   AROM / PROM / Strength AROM      AROM   Overall AROM  Deficits    Overall AROM Comments L shoulder ROM very limited - pt fell and  dislocated his shoulder, had it put back in place and never had rehab 75% limited, R shoulder [facility]    AROM Assessment Site Cervical    Cervical Flexion WFL    Cervical Extension WFL    Cervical - Right Side Bend WFL    Cervical - Left Side Bend WFL    Cervical - Right Rotation WFL    Cervical - Left Rotation [facility]      Ambulation/Gait   Ambulation/Gait Yes    Ambulation Distance (Feet) 10 Feet    Gait Pattern Decreased arm swing - left;Decreased dorsiflexion - right;Decreased dorsiflexion - left;Poor foot clearance - left;Poor foot clearance - right    Ambulation Surface Level             LYMPHEDEMA/ONCOLOGY QUESTIONNAIRE - 03/24/20 0001      Lymphedema Assessments   Lymphedema Assessments Head and Neck      Head and Neck   4 cm superior to sternal notch around neck 43.5 cm    6 cm superior to sternal notch around neck 44 cm    8 cm superior to sternal notch around neck 46.1 cm    Other 50.3   10 cm up from strenal notch                  Objective measurements completed on examination: See above findings.               PT Education - 03/24/20 0927    Education Details Educated pt about signs and symptoms of lymphedema as well as anatomy and physiology of lymphatic system. Educated pt in importance of staying as active as possible throughout treatment to decrease fatigue as well as head and neck ROM exercises to decrease loss of ROM. Will see pt after completion of radiation to reassess ROM and assess for lymphedema to determine therapy needs at that time.    Person(s) Educated Patient    Methods Explanation;Handout    Comprehension Verbalized understanding               PT Long Term Goals - 03/24/20 0836      PT LONG TERM GOAL #1   Title Pt will return to baseline ROM measurements and not demonstrate any signs or symptoms of lymphedema.    Time 8    Period Weeks    Status New  Target Date 05/19/20              Head and Neck Clinic  Goals - 03/24/20 0836      Patient will be able to verbalize understanding of a home exercise program for cervical range of motion, posture, and walking.    Time 1    Period Days    Status Achieved      Patient will be able to verbalize understanding of proper sitting and standing posture.    Time 1    Period Days    Status Achieved      Patient will be able to verbalize understanding of lymphedema risk and availability of treatment for this condition.    Time 1    Period Days    Status Achieved              Plan - 03/24/20 2706    Clinical Impression Statement Pt presents to head and neck clinic with recently diagnosed cancer of oropharynx. His neck ROM is WFL but his L shoulder ROM is 75% limited. He reports he fell on it about 9 months ago and went to the hospital where they put it back in place but he has had limited ROM since that time. He also reports a previous stroke that affected the L side but says his ROM was [facility] after the stroke. Pt would benefit from PT on his L shoulder and overall strengthening after he completes his radiation and chemo due to number of current appointments. Educated pt about signs and symptoms of lymphedema as well as anatomy and physiology of lymphatic system. Educated pt in importance of staying as active as possible throughout treatment to decrease fatigue as well as head and neck ROM exercises to decrease loss of ROM. Will see pt after completion of radiation to reassess ROM and assess for lymphedema to determine therapy needs at that time.    Personal Factors and Comorbidities Fitness;Comorbidity 3+    Comorbidities diabetes, hx of stroke, hx of fall on L side with dislocation of L shoulder    Examination-Activity Limitations Lift;Carry;Reach Overhead    Examination-Participation Restrictions Occupation;Yard Work;Community Activity    Stability/Clinical Decision Making Evolving/Moderate complexity    Clinical Decision Making  Moderate    Rehab Potential Good    PT Frequency --   eval and 1 f/u visit   PT Duration 8 weeks    PT Treatment/Interventions ADLs/Self Care Home Management;Patient/family education;Therapeutic exercise    PT Next Visit Plan reassess baselines    PT Home Exercise Plan head and neck ROM    Consulted and Agree with Plan of Care Patient           Patient will benefit from skilled therapeutic intervention in order to improve the following deficits and impairments:  Postural dysfunction,Decreased range of motion,Decreased knowledge of precautions  Visit Diagnosis: Abnormal posture  Malignant neoplasm of base of tongue ([place])     Problem List Patient Active Problem List   Diagnosis Date Noted  . Malignant neoplasm of base of tongue ([place]) 03/02/2020  . 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 Status epilepticus ([place]) 01/18/2013  . 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 status epilepticus, not intractable ([place]) 01/15/2013  . Hyperlipidemia   . Long term current use of anticoagulant 03/11/2010  . Diabetes mellitus, type II ([place]) 12/20/2009  . Essential hypertension, benign 12/20/2009  . Atrial fibrillation ([place]) 12/20/2009  . CONGESTIVE HEART FAILURE, SYSTOLIC DYSFUNCTION 23/76/2831  . CEREBROVASCULAR ACCIDENT 12/20/2009    Hilaria Ota  Hollie Salk [facility] 03/24/2020, 10:45 AM  [place], [name], [address] Phone: [phone]   Fax:  [phone]  Name: Brian [name] MRN: [id number] Date of Birth: 1955/03/16  Manus Gunning, PT 03/24/20 10:45 AM

## 2020-03-24 NOTE — Progress Notes (Signed)
[name] NOTE  Patient Care Team: [name], MD as PCP - General (Family Medicine) [name], [name], RN as Oncology Nurse Navigator [name], MD as Consulting Physician (Radiation Oncology) [name], MD as Consulting Physician (Hematology and Oncology) [name], RD as Dietitian (Nutrition) [name], [name], [name] as Speech Language Pathologist (Speech Pathology) [name], [name], PT as Physical Therapist (Physical Therapy) Justice Britain as Social Worker (General Practice)  CHIEF COMPLAINTS/PURPOSE OF CONSULTATION:  SCC of oropharynx.  ASSESSMENT & PLAN:  No problem-specific Assessment & Plan notes found for this encounter.  No orders of the defined types were placed in this encounter.  This is a 65 year old male patient with past medical history significant for diabetes, hypertension, coronary artery disease, stroke, atrial fibrillation on anticoagulation, obesity referred to medical oncology for evaluation and recommendations regarding new diagnosis of squamous cell carcinoma of oropharynx. 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 I have reviewed the current recommendations for stage III HPV positive oropharyngeal cancer with the patient today which is primarily concurrent chemoradiation. Given his comorbidities, age, I think he is a good candidate for weekly cisplatin along with radiation.  We have discussed about adverse side effects of cisplatin including but not limited to fatigue, nausea, vomiting, cytopenias, increased risk of infections, permanent hearing loss, nephrotoxicity etc. Baseline audiology referral done by Dr Benjamine Mola, no severe hearing loss noted. Given his creatinine and GFR, we started off with 30 mg/m2 weekly cisplatin. If creatinine and GFR continue to be an issue, will change weekly [place], I dont think he can tolerate a  doublet. He is doing well after his chemotherapy. No nausea, vomiting, hearing loss, worsening neuropathy.  2. Insomnia, he tried ambien 5 mg QHS, no change, he will try 10 mg QHS and let us know.  He understands that this medication can cause day time drowsiness and will increase risk of falls.  3. AKI, encouraged 1.5 to 2 L of water intake a day, repeat labs on Monday and if labs within normal limits, ok to continue cisplatin.  [name] MD   HISTORY OF PRESENTING ILLNESS:   Brian [name] is a 65 y.o. male who presented two-week history of facial swelling in late November/early December 2021. Subsequently, the patient saw Dr. Benjamine Mola, who performed a direct laryngoscopy with biopsy on the date of 02/10/2020. Final pathology revealed: squamous cell carcinoma, P16 positive  He had the following imaging performed  1. Soft tissue neck CT scan performed on 01/28/2020 revealing right glossotonsillar malignancy with bulky bilateral nodal disease. Nodal masses sowed definite extracapsular spread. There was also noted to be incidental advanced cervical spine degeneration with cord compression.  2. PET scan performed on 02/26/2020 revealing a hypermetabolic lesion at the right base of the tongue and right tonsil, consistent with oropharyngeal carcinoma. Carcinoma appeared to be confined to the mucosal surface. There were also noted to bulky bilateral hypermetabolic level II metastatic lymph nodes and smaller inferior hypermetabolic level II metastatic lymph nodes. There was no evidence of metastatic disease in the chest, abdomen, or pelvis. The two small right middle lobe pulmonary nodules present on CT from 2012 were consistent with benign etiology.  Started radiation on 03/14/2020 Chemo delayed by a week due to GFR and infusion issues. Started chemotherapy on 03/21/2020  Interim History  He is here for follow up today. He complains of insomnia. Ambien 5 mg hasnt helped. He thinks  the  otalgia has resolved. No cough, chest pain, SOB No trouble swallowing or with speech  He started drinking more water lately,  No fevers or chills Constipation, using laxatives No change in urinary habits. Review of systems otherwise quite unremarkable, he overall feels well although he is worried about the treatment.  REVIEW OF SYSTEMS:   Constitutional: Denies fevers, chills or abnormal night sweats Eyes: Denies blurriness of vision, double vision or watery eyes Ears, nose, mouth, throat, and face: Denies mucositis or sore throat.  He is able to swallow everything Respiratory: Denies cough, dyspnea or wheezes Cardiovascular: Denies palpitation, chest discomfort or lower extremity swelling Gastrointestinal:  Denies nausea, heartburn or change in bowel habits Skin: Denies abnormal skin rashes Lymphatics: Complains of masses in the neck  neurological:Denies numbness, tingling or new weaknesses Behavioral/Psych: Mood is stable, no new changes  All other systems were reviewed with the patient and are negative.  MEDICAL HISTORY:  Past Medical History:  Diagnosis Date  . Arthritis   . Atrial fibrillation ([place])   . Cardiomyopathy   . Cerebrovascular accident ([place])    2012  . Diabetes mellitus   . Hyperlipidemia   . Hypertension   . Left-sided weakness   . 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 Zigmund Daniel, laser therapy OD  . Seizures ([place])    pt reports this is from low blood sugar    SURGICAL HISTORY: Past Surgical History:  Procedure Laterality Date  . DIRECT LARYNGOSCOPY N/A 02/10/2020   Procedure: DIRECT LARYNGOSCOPY WITH BIOPSY;  Surgeon: [name], MD;  Location: [facility];  Service: ENT;  Laterality: N/A;  . Ear surgery as a child    . IR GASTROSTOMY TUBE MOD SED  03/23/2020  . IR IMAGING GUIDED PORT INSERTION  03/09/2020  . TONSILLECTOMY      SOCIAL HISTORY: Social History   Socioeconomic History  . Marital status: Divorced    Spouse name: Not on file  .  Number of children: 2  . Years of education: Not on file  . Highest education level: Not on file  Occupational History  . Occupation: Disabled  Tobacco Use  . Smoking status: Never Smoker  . Smokeless tobacco: Never Used  Vaping Use  . Vaping Use: Never used  Substance and Sexual Activity  . Alcohol use: Yes    Alcohol/week: 0.0 standard drinks    Comment: seldom  . Drug use: Yes    Frequency: 7.0 times per week    Comment: marijauna for arthritis, last 03-13-20  . Sexual activity: Not Currently  Other Topics Concern  . Not on file  Social History Narrative   He has children.   Social Determinants of Health   Financial Resource Strain: Low Risk   . Difficulty of Paying Living Expenses: Not very hard  Food Insecurity: No Food Insecurity  . Worried About Charity fundraiser in the Last Year: Never true  . Ran Out of Food in the Last Year: Never true  Transportation Needs: No Transportation Needs  . Lack of Transportation (Medical): No  . Lack of Transportation (Non-Medical): No  Physical Activity: Not on file  Stress: Stress Concern Present  . Feeling of Stress : To some extent  Social Connections: Socially Isolated  . Frequency of Communication with Friends and Family: More than three times a week  . Frequency of Social Gatherings with Friends and Family: More than three times a week  . Attends Religious Services: Never  . Active Member  of Clubs or Organizations: No  . Attends Archivist Meetings: Never  . Marital Status: Divorced  Human resources officer Violence: Not on file    FAMILY HISTORY: Family History  Problem Relation Age of Onset  . Hyperlipidemia Father   . Hypertension Father   . Coronary artery disease Neg Hx        no early CAD.  Marland Kitchen Colon cancer Neg Hx   . Stomach cancer Neg Hx   . Rectal cancer Neg Hx   . Esophageal cancer Neg Hx     ALLERGIES:  is allergic to codeine.  MEDICATIONS:  Current Outpatient Medications  Medication Sig Dispense  Refill  . amLODipine (NORVASC) 10 MG tablet Take 1 tablet (10 mg total) by mouth daily. 90 tablet 1  . atorvastatin (LIPITOR) 20 MG tablet TAKE 1 TABLET (20 MG TOTAL) DAILY BY MOUTH. 90 tablet 3  . Blood Glucose Monitoring Suppl (ONETOUCH VERIO IQ SYSTEM) w/Device KIT 1 each by Does not apply route 2 (two) times daily. 1 kit 0  . carvedilol (COREG) 25 MG tablet TAKE 1 TABLET BY MOUTH TWICE A DAY WITH A MEAL (Patient taking differently: Take 25 mg by mouth 2 (two) times daily with a meal.) 180 tablet 3  . chlorthalidone (HYGROTON) 25 MG tablet Take 1 tablet (25 mg total) by mouth daily. 30 tablet 1  . cloNIDine (CATAPRES) 0.1 MG tablet Take 1 tablet (0.1 mg total) by mouth 2 (two) times daily. 60 tablet 3  . dexamethasone (DECADRON) 4 MG tablet Take 2 tablets (8 mg total) by mouth daily. Take daily x 3 days starting the day after cisplatin chemotherapy. Take with food. 30 tablet 1  . furosemide (LASIX) 20 MG tablet TAKE 1 TABLET (20 MG TOTAL) BY MOUTH DAILY AS NEEDED FOR FLUID. 90 tablet 1  . glucose blood test strip 1 each by Other route as needed for other. Use as instructed 100 each 11  . hydrALAZINE (APRESOLINE) 50 MG tablet TAKE 1 TABLET BY MOUTH THREE TIMES A DAY (Patient taking differently: Take 50 mg by mouth 3 (three) times daily.) 270 tablet 1  . Insulin Pen Needle (B-D UF III MINI PEN NEEDLES) 31G X 5 MM MISC USE DAILY WITH INSULIN 100 each 5  . Insulin Syringe-Needle U-100 (INSULIN SYRINGE .3CC/31GX5/16") 31G X 5/16" 0.3 ML MISC Use twice daily to inject insulin. 100 each 10  . LEVEMIR FLEXTOUCH 100 UNIT/ML FlexPen INJECT 70 UNITS UNDER THE SKIN EVERY MORNING AND 40 UNITS AT BEDTIME 15 mL 3  . lidocaine (XYLOCAINE) 2 % solution Patient: Mix 1part 2% viscous lidocaine, 1part H20. Swish & swallow 5m of diluted mixture, 329m before meals and at bedtime, up to QID 200 mL 4  . lidocaine-prilocaine (EMLA) cream Apply to affected area once 30 g 3  . LORazepam (ATIVAN) 0.5 MG tablet Take 1  tablet (0.5 mg total) by mouth every 6 (six) hours as needed (Nausea or vomiting). 30 tablet 0  . losartan (COZAAR) 50 MG tablet Take 1 tablet (50 mg total) by mouth daily. 90 tablet 3  . ondansetron (ZOFRAN) 8 MG tablet Take 1 tablet (8 mg total) by mouth 2 (two) times daily as needed. Start on the third day after cisplatin chemotherapy. 30 tablet 1  . OneTouch Delica Lancets [id number] 1 each by Does not apply route 2 (two) times daily. 200 each 11  . pioglitazone (ACTOS) 15 MG tablet Take 1 tablet (15 mg total) by mouth daily. 90 tablet 2  .  prochlorperazine (COMPAZINE) 10 MG tablet Take 1 tablet (10 mg total) by mouth every 6 (six) hours as needed (Nausea or vomiting). 30 tablet 1  . warfarin (COUMADIN) 10 MG tablet Take 1 tablet (10 mg total) by mouth daily. 90 tablet 3  . zolpidem (AMBIEN) 5 MG tablet Take 1 tablet (5 mg total) by mouth at bedtime as needed for sleep. 30 tablet 0   No current facility-administered medications for this visit.     PHYSICAL EXAMINATION: ECOG PERFORMANCE STATUS: 0 - Asymptomatic  Vitals:   03/24/20 1200  BP: 131/76  Pulse: 70  Resp: 19  Temp: (!) 97 F (36.1 C)  SpO2: 96%   Filed Weights   03/24/20 1200  Weight: 204 lb 1.6 oz (92.6 kg)    GENERAL:alert, no distress and comfortable SKIN: skin color, texture, turgor are normal, no rashes or significant lesions EYES: normal, conjunctiva are pink and non-injected, sclera clear OROPHARYNX: mucositis,  NECK: supple, thyroid normal size, non-tender, without nodularity. LYMPH: Large palpable lymph nodes bilateral cervical region, less prominent today.  No supraclavicular lymphadenopathy LUNGS: clear to auscultation and percussion with normal breathing effort HEART: regular rate & rhythm and no murmurs and no lower extremity edema ABDOMEN:abdomen soft, non-tender and normal bowel sounds Musculoskeletal:no cyanosis of digits and no clubbing  PSYCH: alert & oriented x 3 with fluent speech NEURO: no  focal motor/sensory deficits  LABORATORY DATA:  I have reviewed the data as listed Lab Results  Component Value Date   WBC 8.9 03/21/2020   HGB 12.3 (L) 03/21/2020   HCT 36.7 (L) 03/21/2020   MCV 82.3 03/21/2020   PLT 214 03/21/2020     Chemistry      Component Value Date/Time   NA 133 (L) 03/21/2020 0830   K 4.0 03/21/2020 0830   CL 97 (L) 03/21/2020 0830   CO2 25 03/21/2020 0830   BUN 24 (H) 03/21/2020 0830   CREATININE 1.37 (H) 03/21/2020 0830   CREATININE 1.41 (H) 03/15/2020 0822   CREATININE 1.23 11/30/2019 0915      Component Value Date/Time   CALCIUM 9.3 03/21/2020 0830   ALKPHOS 62 09/25/2016 0914   AST 11 11/30/2019 0915   ALT 14 11/30/2019 0915   BILITOT 0.5 11/30/2019 0915       RADIOGRAPHIC STUDIES: I have personally reviewed the radiological images as listed and agreed with the findings in the report. IR Gastrostomy Tube  Result Date: 03/23/2020 INDICATION: 65 year old male with oropharyngeal cancer requiring percutaneous enteric access. EXAM: PERC PLACEMENT GASTROSTOMY MEDICATIONS: Ancef 2 gm IV; Antibiotics were administered within 1 hour of the procedure. ANESTHESIA/SEDATION: Versed 2 mg IV; Fentanyl 50 mcg IV Moderate Sedation Time:  15 The patient was continuously monitored during the procedure by the interventional radiology nurse under my direct supervision. CONTRAST:  24m OMNIPAQUE IOHEXOL 300 MG/ML SOLN - administered into the gastric lumen. FLUOROSCOPY TIME:  Fluoroscopy Time: 1 minutes 18 seconds (31 mGy). COMPLICATIONS: None immediate. PROCEDURE: Informed written consent was obtained from the patient after a thorough discussion of the procedural risks, benefits and alternatives. All questions were addressed. Maximal Sterile Barrier Technique was utilized including caps, mask, sterile gowns, sterile gloves, sterile drape, hand hygiene and skin antiseptic. A timeout was performed prior to the initiation of the procedure. The patient was placed on the  procedure table in the supine position. Pre-procedure abdominal film confirmed visualization of the transverse colon. An angled 5-French catheter was passed through the nares into the stomach. The patient was prepped  and draped in usual sterile fashion. The stomach was insufflated with air via the indwelling nasogastric tube. Under fluoroscopy, a puncture site was selected and local analgesia achieved with 1% lidocaine infiltrated subcutaneously. Under fluoroscopic guidance, a gastropexy needle was passed into the stomach and the T-bar suture was released. Entry into the stomach was confirmed with fluoroscopy, aspiration of air, and injection of contrast material. This was repeated with an additional gastropexy suture (for a total of 2 fasteners). At the center of these gastropexy sutures, a dermatotomy was performed. An 18 gauge needle was passed into the stomach at the site of this dermatotomy, and position within the gastric lumen again confirmed under fluoroscopy using aspiration of air and contrast injection. An Amplatz guidewire was passed through this needle and intraluminal placement within the stomach was confirmed by fluoroscopy. The needle was removed. Over the guidewire, the percutaneous tract was dilated using a 10 mm non-compliant balloon. The balloon was deflated, then pushed into the gastric lumen followed in concert by the 20 Fr gastrostomy tube. The retention balloon of the percutaneous gastrostomy tube was inflated with 20 mL of sterile water. The tube was withdrawn until the retention balloon was at the edge of the gastric lumen. The external bumper was brought to the abdominal wall. Contrast was injected through the gastrostomy tube, confirming intraluminal positioning. The patient tolerated the procedure well without any immediate post-procedural complications. IMPRESSION: Technically successful placement of 20 Fr gastrostomy tube. Ruthann Cancer, MD Vascular and Interventional Radiology  [facility] Radiology Electronically Signed   By: Ruthann Cancer MD   On: 03/23/2020 13:19   NM PET Image Initial (PI) Skull Base To Thigh  Result Date: 02/26/2020 CLINICAL DATA:  Initial treatment strategy for oropharyngeal carcinoma. EXAM: NUCLEAR MEDICINE PET SKULL BASE TO THIGH TECHNIQUE: 10.4 mCi F-18 FDG was injected intravenously. Full-ring PET imaging was performed from the skull base to thigh after the radiotracer. CT data was obtained and used for attenuation correction and anatomic localization. Fasting blood glucose: 4 170 mg/dl COMPARISON:  neck CT 01/28/2020 CT 09/20/2010 FINDINGS: Mediastinal blood pool activity: SUV max 3.1 Liver activity: SUV max 4.2 NECK: Intense hypermetabolic activity localizes to the RIGHT base of tongue and extends into the RIGHT lingual tonsil. Activity is confined to the mucosal layer. Activity is intense with SUV max equal 10.3 (image 48). Bilateral large bulky intensely hypermetabolic level II lymph nodes (SUV max equal 14). Nodal mass on the RIGHT measures 4.4 cm and on the LEFT 4.8 cm. Inferior to the dominant masses are there several additional small hypermetabolic level II lymph nodes on the LEFT and RIGHT at the level the hyoid. For example 8 mm LEFT inferior level II node (image 51) with SUV max equal 8.9. No more inferior hypermetabolic nodes are identified. No supraclavicular hypermetabolic nodes. Incidental CT findings: Remote infarction noted in the RIGHT parietal lobe. CHEST: No hypermetabolic mediastinal or hilar nodes. Two small pulmonary nodules in the inferior RIGHT middle lobe measure 3 mm and 4 mm on image 102 and 104 series 4. The small pulmonary nodules do not have radiotracer activity. Additionally nodules are present on CT from 09/20/2010. Incidental CT findings: Coronary artery calcification and aortic atherosclerotic calcification. ABDOMEN/PELVIS: No abnormal hypermetabolic activity within the liver, pancreas, adrenal glands, or spleen.  No hypermetabolic lymph nodes in the abdomen or pelvis. Incidental CT findings: none SKELETON: No focal hypermetabolic activity to suggest skeletal metastasis. Incidental CT findings: Degenerative osteophytosis of the spine. IMPRESSION: 1. Hypermetabolic lesion at the RIGHT base  of tongue and RIGHT tonsil consistent with oropharyngeal carcinoma. Carcinoma appears confined to the mucosal surface. 2. Bulky bilateral hypermetabolic level II metastatic lymph nodes. Smaller inferior hypermetabolic level II metastatic lymph nodes. 3. No evidence of metastatic disease in the chest, abdomen, or pelvis. 4. Two small RIGHT middle lobe pulmonary nodules are present on CT from 2012 consistent with benign etiology Electronically Signed   By: Suzy Bouchard M.D.   On: 02/26/2020 10:36   IR IMAGING GUIDED PORT INSERTION  Result Date: 03/09/2020 INDICATION: 65 year old male referred for port catheter EXAM: IMAGE GUIDED PORT CATHETER MEDICATIONS: 2 g Ancef; The antibiotic was administered within an appropriate time interval prior to skin puncture. ANESTHESIA/SEDATION: Moderate (conscious) sedation was employed during this procedure. A total of Versed 3.0 mg and Fentanyl 100 mcg was administered intravenously. Moderate Sedation Time: 20 minutes. The patient's level of consciousness and vital signs were monitored continuously by radiology nursing throughout the procedure under my direct supervision. FLUOROSCOPY TIME:  Fluoroscopy Time: 0 minutes 6 seconds (2 mGy). COMPLICATIONS: None PROCEDURE: The procedure, risks, benefits, and alternatives were explained to the patient. Questions regarding the procedure were encouraged and answered. The patient understands and consents to the procedure. Ultrasound survey was performed with images stored and sent to PACs. The right neck and chest was prepped with chlorhexidine, and draped in the usual sterile fashion using maximum barrier technique (cap and mask, sterile gown, sterile gloves,  large sterile sheet, hand hygiene and cutaneous antiseptic). Antibiotic prophylaxis was provided with 2.0g Ancef administered IV one hour prior to skin incision. Local anesthesia was attained by infiltration with 1% lidocaine without epinephrine. Ultrasound demonstrated patency of the right internal jugular vein, and this was documented with an image. Under real-time ultrasound guidance, this vein was accessed with a 21 gauge micropuncture needle and image documentation was performed. A small dermatotomy was made at the access site with an 11 scalpel. A 0.018" wire was advanced into the SVC and used to estimate the length of the internal catheter. The access needle exchanged for a 72F micropuncture vascular sheath. The 0.018" wire was then removed and a 0.035" wire advanced into the IVC. An appropriate location for the subcutaneous reservoir was selected below the clavicle and an incision was made through the skin and underlying soft tissues. The subcutaneous tissues were then dissected using a combination of blunt and sharp surgical technique and a pocket was formed. A single lumen power injectable portacatheter was then tunneled through the subcutaneous tissues from the pocket to the dermatotomy and the port reservoir placed within the subcutaneous pocket. The venous access site was then serially dilated and a peel away vascular sheath placed over the wire. The wire was removed and the port catheter advanced into position under fluoroscopic guidance. The catheter tip is positioned in the cavoatrial junction. This was documented with a spot image. The portacatheter was then tested and found to flush and aspirate well. The port was flushed with saline followed by 100 units/mL heparinized saline. The pocket was then closed in two layers using first subdermal inverted interrupted absorbable sutures followed by a running subcuticular suture. The epidermis was then sealed with Dermabond. The dermatotomy at the venous  access site was also seal with Dermabond. Patient tolerated the procedure well and remained hemodynamically stable throughout. No complications encountered and no significant blood loss encountered IMPRESSION: Status post right IJ port catheter placement. Catheter ready for use. Signed, Dulcy Fanny. Dellia Nims, [place] [facility] and Interventional Radiology [facility] Radiology Electronically Signed  By: Corrie Mckusick D.O.   On: 03/09/2020 12:45   I reviewed the image myself.  Noted hypermetabolic lesions at the base of tongue on the right side and right tonsil as well as the bilateral bulky lymphadenopathy.  02/10/2020  SURGICAL PATHOLOGY    Reason for Addendum #1: Immunohistochemistry results   Clinical History: throat cancer (cm)    FINAL MICROSCOPIC DIAGNOSIS:   A. OROPHARYNGEAL, RIGHT, BIOPSY:  - Squamous cell carcinoma  - See comment   COMMENT:   P16 immunohistochemistry is pending and will be reported in an addendum.  Dr. Tresa Moore reviewed the case and agrees with the above diagnosis. Dr.  Benjamine Mola was notified of these results on February 11, 2020.   GROSS DESCRIPTION:   The specimen is received in saline and consists of a 1.6 x 1.5 x 0.3 cm  aggregate of tan-pink soft tissue. The specimen is entirely submitted  in 1 cassette. Craig Staggers 02/10/2020)    Final Diagnosis performed by Thressa Sheller, MD.  Electronically signed  02/11/2020  Technical and / or Professional components performed at [facility]. [facility], [place] [address] [place], [place] [address].  Immunohistochemistry Technical component (if applicable) was performed  at [facility]. [address] [place],  [place], [place] [address].  IMMUNOHISTOCHEMISTRY DISCLAIMER (if applicable):  Some of these immunohistochemical stains may have been developed and the  performance characteristics determine by [facility]. Some  may not have been cleared or approved by the U.S.  Food and Drug  Administration. The FDA has determined that such clearance or approval  is not necessary. This test is used for clinical purposes. It should not  be regarded as investigational or for research. This laboratory is  certified under the [place]  (CLIA-88) as qualified to perform high complexity clinical laboratory  testing. The controls stained appropriately.   ADDENDUM:   P16 immunohistochemistry is POSITIVE.   Reviewed labs. AKI  All questions were answered. The patient knows to call the clinic with any problems, questions or concerns. I spent 30 minutes in the care of this patient including H and P, review of records, counseling, documentation and coordination of care.     [name], MD 03/24/2020 2:33 PM

## 2020-03-24 NOTE — Progress Notes (Signed)
   Covid-19 Vaccination Clinic  Name:  Brian [name]    MRN: [id number] DOB: Sep 18, 1955  03/24/2020  [name] was observed post Covid-19 immunization for 15 minutes without incident. He was provided with Vaccine Information Sheet and instruction to access the V-Safe system.   [name] was instructed to call 911 with any severe reactions post vaccine: Marland Kitchen Difficulty breathing  . Swelling of face and throat  . A fast heartbeat  . A bad rash all over body  . Dizziness and weakness   Immunizations Administered    Name Date Dose VIS Date Route   Pfizer COVID-19 Vaccine 03/24/2020 12:26 PM 0.3 mL 12/09/2019 Intramuscular   Manufacturer: [place]   Lot: [id number]   NDC: [address]-3

## 2020-03-25 ENCOUNTER — Other Ambulatory Visit: Payer: Self-pay | Admitting: Family Medicine

## 2020-03-25 ENCOUNTER — Ambulatory Visit
Admission: RE | Admit: 2020-03-25 | Discharge: 2020-03-25 | Disposition: A | Discharge status: Home / Self Care | Payer: Medicare Other | Source: Ambulatory Visit | Attending: Radiation Oncology | Admitting: Radiation Oncology

## 2020-03-25 DIAGNOSIS — C01 Malignant neoplasm of base of tongue: Secondary | ICD-10-CM | POA: Diagnosis not present

## 2020-03-25 NOTE — Patient Instructions (Signed)
SWALLOWING EXERCISES Do these until 6 months after your last day of radiation, then 2-3 times per week afterwards  1. Effortful Swallows - Press your tongue against the roof of your mouth for 3 seconds, then squeeze the muscles in your neck while you swallow your saliva or a sip of water - Repeat 10-15 times, 2-3 times a day, and use whenever you eat or drink  2. Masako Swallow - swallow with your tongue sticking out - Stick tongue out past your lips and gently bite tongue with your teeth - Swallow, while holding your tongue with your teeth - Repeat 10-15 times, 2-3 times a day *use a wet spoon if your mouth gets dry*  3. Pitch Raise - Repeat "he", once per second in as high of a pitch as you can - Repeat 20 times, 2-3 times a day  4. Mendelsohn Maneuver - "half swallow" exercise - Start to swallow, and keep your Adam's apple up by squeezing hard with the muscles of the throat - Hold the squeeze for 5-7 seconds and then relax - Repeat 10-15 times, 2-3 times a day *use a wet spoon if your mouth gets dry*        5. "Super Swallow"  - Take a breath and hold it  - Bear down (like pushing your bowels)  - Swallow then IMMEDIATELY cough  - Repeat 10 times, 2-3 times a day

## 2020-03-25 NOTE — Therapy (Signed)
[facility] [address] [place], [name], [address] Phone: [phone]   Fax:  [phone]  Speech Language Pathology Evaluation  Patient Details  Name: Brian [name] MRN: [id number] Date of Birth: 1955-09-14 Referring Provider (SLP): [name], MD   Encounter Date: 03/24/2020   End of Session - 03/25/20 0038    Visit Number 1    Number of Visits 7    Date for SLP Re-Evaluation 06/22/20    SLP Start Time 1110    SLP Stop Time  1145    SLP Time Calculation (min) 35 min    Activity Tolerance Patient tolerated treatment well           Past Medical History:  Diagnosis Date  . Arthritis   . Atrial fibrillation ([place])   . Cardiomyopathy   . Cerebrovascular accident ([place])    2012  . Diabetes mellitus   . Hyperlipidemia   . Hypertension   . Left-sided weakness   . 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 Zigmund Daniel, laser therapy OD  . Seizures ([place])    pt reports this is from low blood sugar    Past Surgical History:  Procedure Laterality Date  . DIRECT LARYNGOSCOPY N/A 02/10/2020   Procedure: DIRECT LARYNGOSCOPY WITH BIOPSY;  Surgeon: [name], MD;  Location: [facility];  Service: ENT;  Laterality: N/A;  . Ear surgery as a child    . IR GASTROSTOMY TUBE MOD SED  03/23/2020  . IR IMAGING GUIDED PORT INSERTION  03/09/2020  . TONSILLECTOMY      There were no vitals filed for this visit.   Subjective Assessment - 03/25/20 0027    Subjective Scrambled eggs, bacon, mixed fruit this morning for breakfast.    Currently in Pain? No/denies              SLP Evaluation [facility] - 03/25/20 5176      SLP Visit Information   SLP Received On 03/24/20    Referring Provider (SLP) [name], MD    Medical Diagnosis Rt or pharyngeal SCCA      Subjective   Patient/Family Stated Goal "keep my swallowing"      General Information   HPI Initially facial swelling so presented with two week hx of this.  01-28-20 CT revealed rt glossotonsillar malignancy with bil bulky nodal involvement. 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 02-26-20 PET found lesion at rt base of tonge and rt tonsil. 35 fx to base of tongue and bil neck with weekly cisplatin. PEG placed 03-23-20. Pt with hx of rt CVA "8-9 years ago."      Prior Functional Status   Cognitive/Linguistic Baseline Within functional limits      Cognition   Overall Cognitive Status Within Functional Limits for tasks assessed      Auditory Comprehension   Overall Auditory Comprehension Appears within functional limits for tasks assessed      Oral Motor/Sensory Function   Overall Oral Motor/Sensory Function Impaired at baseline    Lingual ROM Reduced left    Lingual Symmetry Abnormal symmetry left    Lingual Strength Reduced Left    Facial Symmetry Left droop    Overall Oral Motor/Sensory Function CVA "8-9 years ago"      Motor Speech   Overall Motor Speech Impaired    Articulation Impaired    Level of Impairment Phrase    Intelligibility Intelligible           Pt currently tolerates dys III  items ([place] sandwich) and water.  POs: Pt ate and drank these items without overt s/s oral or pharyngeal dysphagia. Thyroid elevation appeared adequate, and swallows appeared timely.   Because data states the risk for dysphagia during and after radiation treatment is high due to undergoing radiation tx, SLP taught pt about the possibility of reduced/limited ability for PO intake during rad tx. SLP encouraged pt to continue swallowing POs as far into rad tx as possible, even ingesting POs and/or completing HEP shortly after administration of pain meds. Among other modifications for days when pt cannot functionally swallow, SLP talked about performing only non-swallowing tasks on the handout/HEP, and then adding swallowing tasks back in when it becomes possible to do so.  SLP educated pt re: changes to swallowing musculature after rad tx, and why adherence to  dysphagia HEP provided today and PO consumption was necessary to inhibit muscular disuse atrophy and to reduce muscle fibrosis following rad tx. Pt demonstrated understanding of these things to SLP.    SLP then developed a HEP for pt and pt was instructed how to perform exercises involving lingual, vocal, and pharyngeal strengthening. SLP performed each exercise and pt return demonstrated each exercise. SLP ensured pt performance was correct prior to moving on to next exercise. Pt was instructed to complete this program 2-3 times a day, 6-7 days/week until 6 months after his last rad tx, then x2 a week after that.                  SLP Education - 03/25/20 0036    Education Details late effects of head/neck radiation on swallowing skills, HEP procedure    Person(s) Educated Patient    Methods Explanation;Demonstration;Verbal cues;Handout    Comprehension Verbalized understanding;Returned demonstration;Verbal cues required;Need further instruction            SLP Short Term Goals - 03/25/20 0041      SLP SHORT TERM GOAL #1   Title Pt will complete HEP with rare min A over two sessions    Time 3    Period --   visits, for all STGs   Status New      SLP SHORT TERM GOAL #2   Title Pt will tell SLP rationale for HEP completion    Time 2    Status New      SLP SHORT TERM GOAL #3   Title Pt will tell SLP how a food journal can hasten/facilitate return to more normalized diet    Time 3    Status New            SLP Long Term Goals - 03/25/20 0042      SLP LONG TERM GOAL #1   Title Pt will complete HEP with modified independence over 3 visits    Time 5    Period --   visits, for all LTGs   Status New      SLP LONG TERM GOAL #2   Title Pt will tell SLP 3 overt s/s aspiration PNA with modified independence in 2 sessions    Time 4    Status New      SLP LONG TERM GOAL #3   Title Pt will tell SLP when to decr frequency of HEP    Time 6    Status New             Plan - 03/25/20 0040    Clinical Impression Statement Pt presents today with WNL/WFL swallowing ability with [place] sandwich  and water .  No overt s/sx aspiration PNA reported or observed today. Data suggests that as pts progress through rad or chemorad therapy that their swallowing ability will decrease. Also, WNL swallowing is threatened by muscle fibrosis that will likely develop after rad/chemorad is completed. Therefore, SLP developed an indivdualized exercise program to mitigate/eliminate pt's muscle fibrosis following and as a result of, pt's rad tx. Skilled ST would be beneficial to the pt in order to regularly assess pt's safety with POs and/or need for instrumental swallow assessment, as well as to assess accurate completion of swallowing HEP.    Speech Therapy Frequency --   once approx every 4 weeks   Duration --   7 total visits   Treatment/Interventions Aspiration precaution training;Pharyngeal strengthening exercises;Diet toleration management by SLP;Trials of upgraded texture/liquids;Cueing hierarchy;SLP instruction and feedback;Patient/family education;Functional tasks;Other (comment)    Potential to Achieve Goals Good    SLP Home Exercise Plan provided today    Consulted and Agree with Plan of Care Patient           Patient will benefit from skilled therapeutic intervention in order to improve the following deficits and impairments:   Dysphagia, unspecified type    Problem List Patient Active Problem List   Diagnosis Date Noted  . Malignant neoplasm of base of tongue ([place]) 03/02/2020  . 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 Status epilepticus ([place]) 01/18/2013  . 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 status epilepticus, not intractable ([place]) 01/15/2013  . Hyperlipidemia   . Long term current use of anticoagulant 03/11/2010  . Diabetes mellitus, type II ([place]) 12/20/2009  . Essential hypertension, benign 12/20/2009  . Atrial fibrillation ([place]) 12/20/2009  . CONGESTIVE HEART FAILURE,  SYSTOLIC DYSFUNCTION 26/83/4196  . CEREBROVASCULAR ACCIDENT 12/20/2009    [facility] ,[place], [place]  03/25/2020, 12:44 AM  [facility] [address] [place], [name], [address] Phone: [phone]   Fax:  [phone]  Name: Brian [name] MRN: [id number] Date of Birth: 07-13-1955

## 2020-03-28 ENCOUNTER — Ambulatory Visit: Payer: Medicare Other | Admitting: Hematology and Oncology

## 2020-03-28 ENCOUNTER — Other Ambulatory Visit: Payer: Medicare Other

## 2020-03-28 ENCOUNTER — Inpatient Hospital Stay: Payer: Medicare Other

## 2020-03-28 ENCOUNTER — Inpatient Hospital Stay: Payer: Medicare Other | Admitting: Nutrition

## 2020-03-28 ENCOUNTER — Ambulatory Visit
Admission: RE | Admit: 2020-03-28 | Discharge: 2020-03-28 | Disposition: A | Discharge status: Home / Self Care | Payer: Medicare Other | Source: Ambulatory Visit | Attending: Radiation Oncology | Admitting: Radiation Oncology

## 2020-03-28 ENCOUNTER — Other Ambulatory Visit: Payer: Self-pay | Admitting: Oncology

## 2020-03-28 ENCOUNTER — Other Ambulatory Visit: Payer: Self-pay

## 2020-03-28 ENCOUNTER — Other Ambulatory Visit: Payer: Self-pay | Admitting: Hematology & Oncology

## 2020-03-28 ENCOUNTER — Other Ambulatory Visit: Payer: Self-pay | Admitting: Hematology and Oncology

## 2020-03-28 DIAGNOSIS — C01 Malignant neoplasm of base of tongue: Secondary | ICD-10-CM

## 2020-03-28 DIAGNOSIS — I10 Essential (primary) hypertension: Secondary | ICD-10-CM | POA: Diagnosis not present

## 2020-03-28 DIAGNOSIS — N179 Acute kidney failure, unspecified: Secondary | ICD-10-CM | POA: Diagnosis not present

## 2020-03-28 DIAGNOSIS — G4700 Insomnia, unspecified: Secondary | ICD-10-CM | POA: Diagnosis not present

## 2020-03-28 DIAGNOSIS — Z23 Encounter for immunization: Secondary | ICD-10-CM | POA: Diagnosis not present

## 2020-03-28 DIAGNOSIS — C109 Malignant neoplasm of oropharynx, unspecified: Secondary | ICD-10-CM | POA: Diagnosis not present

## 2020-03-28 DIAGNOSIS — Z5111 Encounter for antineoplastic chemotherapy: Secondary | ICD-10-CM | POA: Diagnosis not present

## 2020-03-28 DIAGNOSIS — E119 Type 2 diabetes mellitus without complications: Secondary | ICD-10-CM | POA: Diagnosis not present

## 2020-03-28 DIAGNOSIS — I2510 Atherosclerotic heart disease of native coronary artery without angina pectoris: Secondary | ICD-10-CM | POA: Diagnosis not present

## 2020-03-28 DIAGNOSIS — I4891 Unspecified atrial fibrillation: Secondary | ICD-10-CM | POA: Diagnosis not present

## 2020-03-28 LAB — BASIC METABOLIC PANEL
Anion gap: 12 (ref 5–15)
BUN: 59 mg/dL — ABNORMAL HIGH (ref 8–23)
CO2: 26 mmol/L (ref 22–32)
Calcium: 8.2 mg/dL — ABNORMAL LOW (ref 8.9–10.3)
Chloride: 94 mmol/L — ABNORMAL LOW (ref 98–111)
Creatinine, Ser: 2.37 mg/dL — ABNORMAL HIGH (ref 0.61–1.24)
GFR, Estimated: 29 mL/min — ABNORMAL LOW (ref >60)
Glucose, Bld: 259 mg/dL — ABNORMAL HIGH (ref 70–99)
Potassium: 4 mmol/L (ref 3.5–5.1)
Sodium: 132 mmol/L — ABNORMAL LOW (ref 135–145)

## 2020-03-28 LAB — CBC WITH DIFFERENTIAL/PLATELET
Abs Immature Granulocytes: 0.03 10*3/uL (ref 0.00–0.07)
Basophils Absolute: 0 10*3/uL (ref 0.0–0.1)
Basophils Relative: 0 %
Eosinophils Absolute: 0 10*3/uL (ref 0.0–0.5)
Eosinophils Relative: 1 %
HCT: 32.4 % — ABNORMAL LOW (ref 39.0–52.0)
Hemoglobin: 10.8 g/dL — ABNORMAL LOW (ref 13.0–17.0)
Immature Granulocytes: 1 %
Lymphocytes Relative: 8 %
Lymphs Abs: 0.5 10*3/uL — ABNORMAL LOW (ref 0.7–4.0)
MCH: 27.6 pg (ref 26.0–34.0)
MCHC: 33.3 g/dL (ref 30.0–36.0)
MCV: 82.7 fL (ref 80.0–100.0)
Monocytes Absolute: 0.5 10*3/uL (ref 0.1–1.0)
Monocytes Relative: 9 %
Neutro Abs: 5.1 10*3/uL (ref 1.7–7.7)
Neutrophils Relative %: 81 %
Platelets: 158 10*3/uL (ref 150–400)
RBC: 3.92 MIL/uL — ABNORMAL LOW (ref 4.22–5.81)
RDW: 13.6 % (ref 11.5–15.5)
WBC: 6.2 10*3/uL (ref 4.0–10.5)
nRBC: 0 % (ref 0.0–0.2)

## 2020-03-28 LAB — MAGNESIUM: Magnesium: 1.4 mg/dL — ABNORMAL LOW (ref 1.7–2.4)

## 2020-03-28 MED ORDER — MAGNESIUM SULFATE 2 GM/50ML IV SOLN
2.0000 g | Freq: Once | INTRAVENOUS | Status: AC
  Administered 2020-03-28: 2 g via INTRAVENOUS

## 2020-03-28 MED ORDER — SODIUM CHLORIDE 0.9 % IV SOLN
INTRAVENOUS | Status: AC
  Filled 2020-03-28: qty 250

## 2020-03-28 MED ORDER — PALONOSETRON HCL INJECTION 0.25 MG/5ML
INTRAVENOUS | Status: AC
  Filled 2020-03-28: qty 5

## 2020-03-28 MED ORDER — SODIUM CHLORIDE 0.9% FLUSH
10.0000 mL | INTRAVENOUS | Status: DC | PRN
  Administered 2020-03-28: 10 mL
  Filled 2020-03-28: qty 10

## 2020-03-28 MED ORDER — POTASSIUM CHLORIDE IN NACL 20-0.9 MEQ/L-% IV SOLN
Freq: Once | INTRAVENOUS | Status: AC
  Filled 2020-03-28: qty 1000

## 2020-03-28 MED ORDER — HEPARIN SOD (PORK) LOCK FLUSH 100 UNIT/ML IV SOLN
500.0000 [IU] | Freq: Once | INTRAVENOUS | Status: AC | PRN
  Administered 2020-03-28: 500 [IU]
  Filled 2020-03-28: qty 5

## 2020-03-28 MED ORDER — MAGNESIUM SULFATE 2 GM/50ML IV SOLN
INTRAVENOUS | Status: AC
  Filled 2020-03-28: qty 50

## 2020-03-28 MED ORDER — SODIUM CHLORIDE 0.9% IV SOLUTION
Freq: Once | INTRAVENOUS | Status: AC
  Filled 2020-03-28: qty 1000

## 2020-03-28 MED ORDER — SODIUM CHLORIDE 0.9 % IV SOLN
Freq: Once | INTRAVENOUS | Status: DC
  Filled 2020-03-28: qty 250

## 2020-03-28 NOTE — Progress Notes (Signed)
Pt lab results with Creat of 2.37. After conversations with MD on call  Dr. [place] Olp states to hold treatment for today but to still administer 1LNS over 2 hours.

## 2020-03-28 NOTE — Patient Instructions (Addendum)
Treatment held today due to renal function labs.

## 2020-03-28 NOTE — Progress Notes (Signed)
Nutrition follow-up completed during infusion for tongue cancer. Weight improved slightly to 204.1 pounds on February 3. Patient is status post PEG placement.  He has received his education. Patient reports his appetite remains good and he is eating well. He is plenty of Ensure at home but has not started to drink it yet. He denies questions or concerns regarding feeding tube placement. Labs noted: Sodium 132, Glucose 259, BUN 59, creatinine 2.37, magnesium 1.4.  Nutrition diagnosis: Inadequate oral intake is stable.  Estimated nutrition needs: 2200-2500 cal, 120-135 g protein, 2.4 L fluid.  Intervention: Patient educated to continue strategies for adequate calorie and protein intake. Begin Ensure plus by mouth as tolerated. Will begin tube feeding once oral intake decreases and weight declines.  Monitoring, evaluation, goals: Patient will tolerate adequate calories and protein to minimize weight loss.  Next visit: Monday, February 14 during infusion.  **Disclaimer: This note was dictated with voice recognition software. Similar sounding words can inadvertently be transcribed and this note may contain transcription errors which may not have been corrected upon publication of note.**

## 2020-03-29 ENCOUNTER — Ambulatory Visit: Payer: Medicare Other

## 2020-03-29 DIAGNOSIS — E162 Hypoglycemia, unspecified: Secondary | ICD-10-CM | POA: Diagnosis not present

## 2020-03-29 DIAGNOSIS — R001 Bradycardia, unspecified: Secondary | ICD-10-CM | POA: Diagnosis not present

## 2020-03-29 DIAGNOSIS — E161 Other hypoglycemia: Secondary | ICD-10-CM | POA: Diagnosis not present

## 2020-03-29 DIAGNOSIS — R404 Transient alteration of awareness: Secondary | ICD-10-CM | POA: Diagnosis not present

## 2020-03-30 ENCOUNTER — Inpatient Hospital Stay (HOSPITAL_BASED_OUTPATIENT_CLINIC_OR_DEPARTMENT_OTHER): Payer: Medicare Other | Admitting: Hematology and Oncology

## 2020-03-30 ENCOUNTER — Other Ambulatory Visit: Payer: Self-pay | Admitting: Hematology and Oncology

## 2020-03-30 ENCOUNTER — Other Ambulatory Visit: Payer: Self-pay

## 2020-03-30 ENCOUNTER — Telehealth: Payer: Self-pay | Admitting: Hematology and Oncology

## 2020-03-30 ENCOUNTER — Ambulatory Visit
Admission: RE | Admit: 2020-03-30 | Discharge: 2020-03-30 | Disposition: A | Discharge status: Home / Self Care | Payer: Medicare Other | Source: Ambulatory Visit | Attending: Radiation Oncology | Admitting: Radiation Oncology

## 2020-03-30 ENCOUNTER — Encounter: Payer: Self-pay | Admitting: Hematology and Oncology

## 2020-03-30 DIAGNOSIS — I10 Essential (primary) hypertension: Secondary | ICD-10-CM | POA: Diagnosis not present

## 2020-03-30 DIAGNOSIS — C01 Malignant neoplasm of base of tongue: Secondary | ICD-10-CM

## 2020-03-30 DIAGNOSIS — Z23 Encounter for immunization: Secondary | ICD-10-CM | POA: Diagnosis not present

## 2020-03-30 DIAGNOSIS — Z5111 Encounter for antineoplastic chemotherapy: Secondary | ICD-10-CM | POA: Diagnosis not present

## 2020-03-30 DIAGNOSIS — I2510 Atherosclerotic heart disease of native coronary artery without angina pectoris: Secondary | ICD-10-CM | POA: Diagnosis not present

## 2020-03-30 DIAGNOSIS — E119 Type 2 diabetes mellitus without complications: Secondary | ICD-10-CM | POA: Diagnosis not present

## 2020-03-30 DIAGNOSIS — G4700 Insomnia, unspecified: Secondary | ICD-10-CM | POA: Diagnosis not present

## 2020-03-30 DIAGNOSIS — C109 Malignant neoplasm of oropharynx, unspecified: Secondary | ICD-10-CM | POA: Diagnosis not present

## 2020-03-30 DIAGNOSIS — N179 Acute kidney failure, unspecified: Secondary | ICD-10-CM | POA: Diagnosis not present

## 2020-03-30 DIAGNOSIS — I4891 Unspecified atrial fibrillation: Secondary | ICD-10-CM | POA: Diagnosis not present

## 2020-03-30 NOTE — Progress Notes (Signed)
DISCONTINUE ON PATHWAY REGIMEN - Head and Neck     A cycle is every 7 days:     Cisplatin   **Always confirm dose/schedule in your pharmacy ordering system**  REASON: Toxicities / Adverse Event PRIOR TREATMENT: [id number]: Cisplatin 40 mg/m2 q7 Days with Concurrent Radiation TREATMENT RESPONSE: Unable to Evaluate  START ON PATHWAY REGIMEN - Head and Neck     A cycle is every 7 days:     Carboplatin   **Always confirm dose/schedule in your pharmacy ordering system**  Patient Characteristics: Oropharynx, HPV Positive, Preoperative or Nonsurgical Candidate (Clinical Staging), cT0-4, cN1-3 or cT3-4, cN0 Disease Classification: Oropharynx HPV Status: Positive (+) Therapeutic Status: Preoperative or Nonsurgical Candidate (Clinical Staging) AJCC T Category: cT2 AJCC 8 Stage Grouping: III AJCC N Category: cN3 AJCC M Category: cM0 Intent of Therapy: Curative Intent, Discussed with Patient

## 2020-03-30 NOTE — Telephone Encounter (Signed)
Scheduled phone visit per 2/9 secure chat with Dr. Chryl Heck. Attempted to call patient using both numbers provided in the chart. Spoke to patient's mother who said that he was out running errands and she was unsure when he would be phone. I left my contact information for patient to call me back today to confirm appt. I called patient's cell number as well which went to voicemail. I left a message for the patient with appointment date and time as well as instructions for patient to call me back to confirm appointment.

## 2020-03-30 NOTE — Progress Notes (Unsigned)
Given worsening creatinine, changed treatment plan to carboplatin weekly.

## 2020-03-30 NOTE — Progress Notes (Signed)
[place] NOTE  Patient Care Team: [name], MD as PCP - General (Family Medicine) [name], [name], RN as Oncology Nurse Navigator [name], MD as Consulting Physician (Radiation Oncology) [name], MD as Consulting Physician (Hematology and Oncology) [name], RD as Dietitian (Nutrition) [name], [name], [name] as Speech Language Pathologist (Speech Pathology) [name], [name], PT as Physical Therapist (Physical Therapy) Justice Britain as Social Worker (General Practice)  CHIEF COMPLAINTS/PURPOSE OF CONSULTATION:  SCC of oropharynx.  ASSESSMENT & PLAN:  No problem-specific Assessment & Plan notes found for this encounter.  No orders of the defined types were placed in this encounter.  This is a 65 year old male patient with past medical history significant for diabetes, hypertension, coronary artery disease, stroke, atrial fibrillation on anticoagulation, obesity referred to medical oncology for evaluation and recommendations regarding new diagnosis of squamous cell carcinoma of oropharynx. 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 I have reviewed the current recommendations for stage III HPV positive oropharyngeal cancer with the patient today which is primarily concurrent chemoradiation. Given his comorbidities, age, I think he is a good candidate for weekly cisplatin along with radiation.  We have discussed about adverse side effects of cisplatin including but not limited to fatigue, nausea, vomiting, cytopenias, increased risk of infections, permanent hearing loss, nephrotoxicity etc. Baseline audiology referral done by Dr Benjamine Mola, no severe hearing loss noted. Given his creatinine and GFR, we started off with 30 mg/m2 weekly cisplatin. Creatinine and GFR continue to be sub optimal, so we will change chemotherapy to weekly carboplatin. Discussed  adverse effects of carboplatin with the patient including but not limited to fatigue, nausea, vomiting, diarrhea, increased risk of infections and very rarely fatal events. Discussed this with Dr Isidore Moos as well. Informed consent obtained over the phone. Treatment plan updated.  2. Insomnia, he is using 10 mg QHS of ambien. He understands that this medication can cause day time drowsiness and will increase risk of falls.  3. AKI, encouraged hydration. He also received IV hydration 2 days ago Will repeat labs at his next visit.  FU scheduled 2/14. [name] MD   HISTORY OF PRESENTING ILLNESS:   Brian [name] is a 65 y.o. male who presented two-week history of facial swelling in late November/early December 2021. Subsequently, the patient saw Dr. Benjamine Mola, who performed a direct laryngoscopy with biopsy on the date of 02/10/2020. Final pathology revealed: squamous cell carcinoma, P16 positive  He had the following imaging performed  1. Soft tissue neck CT scan performed on 01/28/2020 revealing right glossotonsillar malignancy with bulky bilateral nodal disease. Nodal masses sowed definite extracapsular spread. There was also noted to be incidental advanced cervical spine degeneration with cord compression.  2. PET scan performed on 02/26/2020 revealing a hypermetabolic lesion at the right base of the tongue and right tonsil, consistent with oropharyngeal carcinoma. Carcinoma appeared to be confined to the mucosal surface. There were also noted to bulky bilateral hypermetabolic level II metastatic lymph nodes and smaller inferior hypermetabolic level II metastatic lymph nodes. There was no evidence of metastatic disease in the chest, abdomen, or pelvis. The two small right middle lobe pulmonary nodules present on CT from 2012 were consistent with benign etiology.  Started radiation on 03/14/2020 Chemo delayed by a week due to GFR and infusion issues. Started chemotherapy on 03/21/2020 C2  delayed because of AKI   Interim History  He is here  for telephone FU. He says he is drinking lot of water but the kidney numbers were still up last time. No trouble swallowing or with speech  No fevers or chills Mild diarrhea, doesn't think he needs any medication. No change in urinary habits. Review of systems otherwise quite unremarkable,   REVIEW OF SYSTEMS:   Constitutional: Denies fevers, chills or abnormal night sweats Eyes: Denies blurriness of vision, double vision or watery eyes Ears, nose, mouth, throat, and face: Denies mucositis or sore throat.  He is able to swallow everything Respiratory: Denies cough, dyspnea or wheezes Cardiovascular: Denies palpitation, chest discomfort or lower extremity swelling Gastrointestinal:  Denies nausea, heartburn or change in bowel habits Skin: Denies abnormal skin rashes Lymphatics: Complains of masses in the neck  neurological:Denies numbness, tingling or new weaknesses Behavioral/Psych: Mood is stable, no new changes  All other systems were reviewed with the patient and are negative.  MEDICAL HISTORY:  Past Medical History:  Diagnosis Date  . Arthritis   . Atrial fibrillation ([place])   . Cardiomyopathy   . Cerebrovascular accident ([place])    2012  . Diabetes mellitus   . Hyperlipidemia   . Hypertension   . Left-sided weakness   . 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 Zigmund Daniel, laser therapy OD  . Seizures ([place])    pt reports this is from low blood sugar    SURGICAL HISTORY: Past Surgical History:  Procedure Laterality Date  . DIRECT LARYNGOSCOPY N/A 02/10/2020   Procedure: DIRECT LARYNGOSCOPY WITH BIOPSY;  Surgeon: [name], MD;  Location: [place];  Service: ENT;  Laterality: N/A;  . Ear surgery as a child    . IR GASTROSTOMY TUBE MOD SED  03/23/2020  . IR IMAGING GUIDED PORT INSERTION  03/09/2020  . TONSILLECTOMY      SOCIAL HISTORY: Social History   Socioeconomic History  . Marital status: Divorced     Spouse name: Not on file  . Number of children: 2  . Years of education: Not on file  . Highest education level: Not on file  Occupational History  . Occupation: Disabled  Tobacco Use  . Smoking status: Never Smoker  . Smokeless tobacco: Never Used  Vaping Use  . Vaping Use: Never used  Substance and Sexual Activity  . Alcohol use: Yes    Alcohol/week: 0.0 standard drinks    Comment: seldom  . Drug use: Yes    Frequency: 7.0 times per week    Comment: marijauna for arthritis, last 03-13-20  . Sexual activity: Not Currently  Other Topics Concern  . Not on file  Social History Narrative   He has children.   Social Determinants of Health   Financial Resource Strain: Low Risk   . Difficulty of Paying Living Expenses: Not very hard  Food Insecurity: No Food Insecurity  . Worried About Charity fundraiser in the Last Year: Never true  . Ran Out of Food in the Last Year: Never true  Transportation Needs: No Transportation Needs  . Lack of Transportation (Medical): No  . Lack of Transportation (Non-Medical): No  Physical Activity: Not on file  Stress: Stress Concern Present  . Feeling of Stress : To some extent  Social Connections: Socially Isolated  . Frequency of Communication with Friends and Family: More than three times a week  . Frequency of Social Gatherings with Friends and Family: More than three times a week  . Attends Religious Services: Never  . Active Member of Clubs  or Organizations: No  . Attends Archivist Meetings: Never  . Marital Status: Divorced  Human resources officer Violence: Not on file    FAMILY HISTORY: Family History  Problem Relation Age of Onset  . Hyperlipidemia Father   . Hypertension Father   . Coronary artery disease Neg Hx        no early CAD.  Marland Kitchen Colon cancer Neg Hx   . Stomach cancer Neg Hx   . Rectal cancer Neg Hx   . Esophageal cancer Neg Hx     ALLERGIES:  is allergic to codeine.  MEDICATIONS:  Current Outpatient  Medications  Medication Sig Dispense Refill  . amLODipine (NORVASC) 10 MG tablet Take 1 tablet (10 mg total) by mouth daily. 90 tablet 1  . atorvastatin (LIPITOR) 20 MG tablet TAKE 1 TABLET (20 MG TOTAL) DAILY BY MOUTH. 90 tablet 3  . Blood Glucose Monitoring Suppl (ONETOUCH VERIO IQ SYSTEM) w/Device KIT 1 each by Does not apply route 2 (two) times daily. 1 kit 0  . carvedilol (COREG) 25 MG tablet TAKE 1 TABLET BY MOUTH TWICE A DAY WITH A MEAL (Patient taking differently: Take 25 mg by mouth 2 (two) times daily with a meal.) 180 tablet 3  . chlorthalidone (HYGROTON) 25 MG tablet TAKE 1 TABLET BY MOUTH EVERY DAY 90 tablet 1  . cloNIDine (CATAPRES) 0.1 MG tablet Take 1 tablet (0.1 mg total) by mouth 2 (two) times daily. 60 tablet 3  . dexamethasone (DECADRON) 4 MG tablet Take 2 tablets (8 mg total) by mouth daily. Take daily x 3 days starting the day after cisplatin chemotherapy. Take with food. 30 tablet 1  . furosemide (LASIX) 20 MG tablet TAKE 1 TABLET (20 MG TOTAL) BY MOUTH DAILY AS NEEDED FOR FLUID. 90 tablet 1  . glucose blood test strip 1 each by Other route as needed for other. Use as instructed 100 each 11  . hydrALAZINE (APRESOLINE) 50 MG tablet TAKE 1 TABLET BY MOUTH THREE TIMES A DAY (Patient taking differently: Take 50 mg by mouth 3 (three) times daily.) 270 tablet 1  . Insulin Pen Needle (B-D UF III MINI PEN NEEDLES) 31G X 5 MM MISC USE DAILY WITH INSULIN 100 each 5  . Insulin Syringe-Needle U-100 (INSULIN SYRINGE .3CC/31GX5/16") 31G X 5/16" 0.3 ML MISC Use twice daily to inject insulin. 100 each 10  . LEVEMIR FLEXTOUCH 100 UNIT/ML FlexPen INJECT 70 UNITS UNDER THE SKIN EVERY MORNING AND 40 UNITS AT BEDTIME 15 mL 3  . lidocaine (XYLOCAINE) 2 % solution Patient: Mix 1part 2% viscous lidocaine, 1part H20. Swish & swallow 31m of diluted mixture, 336m before meals and at bedtime, up to QID 200 mL 4  . lidocaine-prilocaine (EMLA) cream Apply to affected area once 30 g 3  . LORazepam  (ATIVAN) 0.5 MG tablet Take 1 tablet (0.5 mg total) by mouth every 6 (six) hours as needed (Nausea or vomiting). 30 tablet 0  . losartan (COZAAR) 50 MG tablet Take 1 tablet (50 mg total) by mouth daily. 90 tablet 3  . ondansetron (ZOFRAN) 8 MG tablet Take 1 tablet (8 mg total) by mouth 2 (two) times daily as needed. Start on the third day after cisplatin chemotherapy. 30 tablet 1  . OneTouch Delica Lancets 3349SISC 1 each by Does not apply route 2 (two) times daily. 200 each 11  . pioglitazone (ACTOS) 15 MG tablet Take 1 tablet (15 mg total) by mouth daily. 90 tablet 2  . prochlorperazine (COMPAZINE) 10 MG  tablet Take 1 tablet (10 mg total) by mouth every 6 (six) hours as needed (Nausea or vomiting). 30 tablet 1  . warfarin (COUMADIN) 10 MG tablet Take 1 tablet (10 mg total) by mouth daily. 90 tablet 3  . zolpidem (AMBIEN) 5 MG tablet Take 1 tablet (5 mg total) by mouth at bedtime as needed for sleep. 30 tablet 0   No current facility-administered medications for this visit.     PHYSICAL EXAMINATION: ECOG PERFORMANCE STATUS: 0 - Asymptomatic  There were no vitals filed for this visit. There were no vitals filed for this visit.  PE not done, telephone visit.  LABORATORY DATA:  I have reviewed the data as listed Lab Results  Component Value Date   WBC 6.2 03/28/2020   HGB 10.8 (L) 03/28/2020   HCT 32.4 (L) 03/28/2020   MCV 82.7 03/28/2020   PLT 158 03/28/2020     Chemistry      Component Value Date/Time   NA 132 (L) 03/28/2020 0930   K 4.0 03/28/2020 0930   CL 94 (L) 03/28/2020 0930   CO2 26 03/28/2020 0930   BUN 59 (H) 03/28/2020 0930   CREATININE 2.37 (H) 03/28/2020 0930   CREATININE 1.41 (H) 03/15/2020 0822   CREATININE 1.23 11/30/2019 0915      Component Value Date/Time   CALCIUM 8.2 (L) 03/28/2020 0930   ALKPHOS 62 09/25/2016 0914   AST 11 11/30/2019 0915   ALT 14 11/30/2019 0915   BILITOT 0.5 11/30/2019 0915       RADIOGRAPHIC STUDIES: I have personally  reviewed the radiological images as listed and agreed with the findings in the report. IR Gastrostomy Tube  Result Date: 03/23/2020 INDICATION: 65 year old male with oropharyngeal cancer requiring percutaneous enteric access. EXAM: PERC PLACEMENT GASTROSTOMY MEDICATIONS: Ancef 2 gm IV; Antibiotics were administered within 1 hour of the procedure. ANESTHESIA/SEDATION: Versed 2 mg IV; Fentanyl 50 mcg IV Moderate Sedation Time:  15 The patient was continuously monitored during the procedure by the interventional radiology nurse under my direct supervision. CONTRAST:  62m OMNIPAQUE IOHEXOL 300 MG/ML SOLN - administered into the gastric lumen. FLUOROSCOPY TIME:  Fluoroscopy Time: 1 minutes 18 seconds (31 mGy). COMPLICATIONS: None immediate. PROCEDURE: Informed written consent was obtained from the patient after a thorough discussion of the procedural risks, benefits and alternatives. All questions were addressed. Maximal Sterile Barrier Technique was utilized including caps, mask, sterile gowns, sterile gloves, sterile drape, hand hygiene and skin antiseptic. A timeout was performed prior to the initiation of the procedure. The patient was placed on the procedure table in the supine position. Pre-procedure abdominal film confirmed visualization of the transverse colon. An angled 5-French catheter was passed through the nares into the stomach. The patient was prepped and draped in usual sterile fashion. The stomach was insufflated with air via the indwelling nasogastric tube. Under fluoroscopy, a puncture site was selected and local analgesia achieved with 1% lidocaine infiltrated subcutaneously. Under fluoroscopic guidance, a gastropexy needle was passed into the stomach and the T-bar suture was released. Entry into the stomach was confirmed with fluoroscopy, aspiration of air, and injection of contrast material. This was repeated with an additional gastropexy suture (for a total of 2 fasteners). At the center of  these gastropexy sutures, a dermatotomy was performed. An 18 gauge needle was passed into the stomach at the site of this dermatotomy, and position within the gastric lumen again confirmed under fluoroscopy using aspiration of air and contrast injection. An Amplatz guidewire was passed  through this needle and intraluminal placement within the stomach was confirmed by fluoroscopy. The needle was removed. Over the guidewire, the percutaneous tract was dilated using a 10 mm non-compliant balloon. The balloon was deflated, then pushed into the gastric lumen followed in concert by the 20 Fr gastrostomy tube. The retention balloon of the percutaneous gastrostomy tube was inflated with 20 mL of sterile water. The tube was withdrawn until the retention balloon was at the edge of the gastric lumen. The external bumper was brought to the abdominal wall. Contrast was injected through the gastrostomy tube, confirming intraluminal positioning. The patient tolerated the procedure well without any immediate post-procedural complications. IMPRESSION: Technically successful placement of 20 Fr gastrostomy tube. Ruthann Cancer, MD [facility] Radiology Electronically Signed   By: Ruthann Cancer MD   On: 03/23/2020 13:19   IR IMAGING GUIDED PORT INSERTION  Result Date: 03/09/2020 INDICATION: 65 year old male referred for port catheter EXAM: IMAGE GUIDED PORT CATHETER MEDICATIONS: 2 g Ancef; The antibiotic was administered within an appropriate time interval prior to skin puncture. ANESTHESIA/SEDATION: Moderate (conscious) sedation was employed during this procedure. A total of Versed 3.0 mg and Fentanyl 100 mcg was administered intravenously. Moderate Sedation Time: 20 minutes. The patient's level of consciousness and vital signs were monitored continuously by radiology nursing throughout the procedure under my direct supervision. FLUOROSCOPY TIME:  Fluoroscopy Time: 0 minutes 6 seconds  (2 mGy). COMPLICATIONS: None PROCEDURE: The procedure, risks, benefits, and alternatives were explained to the patient. Questions regarding the procedure were encouraged and answered. The patient understands and consents to the procedure. Ultrasound survey was performed with images stored and sent to PACs. The right neck and chest was prepped with chlorhexidine, and draped in the usual sterile fashion using maximum barrier technique (cap and mask, sterile gown, sterile gloves, large sterile sheet, hand hygiene and cutaneous antiseptic). Antibiotic prophylaxis was provided with 2.0g Ancef administered IV one hour prior to skin incision. Local anesthesia was attained by infiltration with 1% lidocaine without epinephrine. Ultrasound demonstrated patency of the right internal jugular vein, and this was documented with an image. Under real-time ultrasound guidance, this vein was accessed with a 21 gauge micropuncture needle and image documentation was performed. A small dermatotomy was made at the access site with an 11 scalpel. A 0.018" wire was advanced into the SVC and used to estimate the length of the internal catheter. The access needle exchanged for a 11F micropuncture vascular sheath. The 0.018" wire was then removed and a 0.035" wire advanced into the IVC. An appropriate location for the subcutaneous reservoir was selected below the clavicle and an incision was made through the skin and underlying soft tissues. The subcutaneous tissues were then dissected using a combination of blunt and sharp surgical technique and a pocket was formed. A single lumen power injectable portacatheter was then tunneled through the subcutaneous tissues from the pocket to the dermatotomy and the port reservoir placed within the subcutaneous pocket. The venous access site was then serially dilated and a peel away vascular sheath placed over the wire. The wire was removed and the port catheter advanced into position under fluoroscopic  guidance. The catheter tip is positioned in the cavoatrial junction. This was documented with a spot image. The portacatheter was then tested and found to flush and aspirate well. The port was flushed with saline followed by 100 units/mL heparinized saline. The pocket was then closed in two layers using first subdermal inverted interrupted absorbable sutures followed by a  running subcuticular suture. The epidermis was then sealed with Dermabond. The dermatotomy at the venous access site was also seal with Dermabond. Patient tolerated the procedure well and remained hemodynamically stable throughout. No complications encountered and no significant blood loss encountered IMPRESSION: Status post right IJ port catheter placement. Catheter ready for use. Signed, Dulcy Fanny. Dellia Nims, RPVI [facility] Radiology Electronically Signed   By: Corrie Mckusick D.O.   On: 03/09/2020 12:45   I reviewed the image myself.  Noted hypermetabolic lesions at the base of tongue on the right side and right tonsil as well as the bilateral bulky lymphadenopathy.  02/10/2020  SURGICAL PATHOLOGY    Reason for Addendum #1: Immunohistochemistry results   Clinical History: throat cancer (cm)    FINAL MICROSCOPIC DIAGNOSIS:   A. OROPHARYNGEAL, RIGHT, BIOPSY:  - Squamous cell carcinoma  - See comment   COMMENT:   P16 immunohistochemistry is pending and will be reported in an addendum.  Dr. Tresa Moore reviewed the case and agrees with the above diagnosis. Dr.  Benjamine Mola was notified of these results on February 11, 2020.   GROSS DESCRIPTION:   The specimen is received in saline and consists of a 1.6 x 1.5 x 0.3 cm  aggregate of tan-pink soft tissue. The specimen is entirely submitted  in 1 cassette. Craig Staggers 02/10/2020)    Final Diagnosis performed by Thressa Sheller, MD.  Electronically signed  02/11/2020  Technical and / or Professional components performed at [facility]. [facility], [place] [address] [place], [place] [address].  Immunohistochemistry Technical component (if applicable) was performed  at [facility]. [address] [place],  [place], [place] [address].  IMMUNOHISTOCHEMISTRY DISCLAIMER (if applicable):  Some of these immunohistochemical stains may have been developed and the  performance characteristics determine by [facility]. Some  may not have been cleared or approved by the U.S. Food and Drug  Administration. The FDA has determined that such clearance or approval  is not necessary. This test is used for clinical purposes. It should not  be regarded as investigational or for research. This laboratory is  certified under the [employer]  (CLIA-88) as qualified to perform high complexity clinical laboratory  testing. The controls stained appropriately.   ADDENDUM:   P16 immunohistochemistry is POSITIVE.   Reviewed labs. AKI  All questions were answered. The patient knows to call the clinic with any problems, questions or concerns. I spent 20 minutes in the care of this patient including H and P, review of records, counseling, documentation and coordination of care.     [name], MD 03/30/2020 3:41 PM

## 2020-03-30 NOTE — Progress Notes (Signed)
Oncology Nurse Navigator Documentation  I met with Brian [name] after his radiation treatment today. I accompanied to the radiation nursing area to be seen for his weekly visit with Dr. Isidore Moos. I helped him change his PEG dressing and flushed it without difficulty. I then accompanied him to IR to have a Brian [name] valve placed on the end of his PEG to assist him with clamping the PEG during flushing and future nutritional feeds. He voiced his appreciation and will let me know if he has any further needs as his treatment progresses.   Harlow Asa RN, BSN, OCN Head & Neck Oncology Nurse [place] [facility] Phone # [phone]  Fax # [phone]

## 2020-03-31 ENCOUNTER — Ambulatory Visit
Admission: RE | Admit: 2020-03-31 | Discharge: 2020-03-31 | Disposition: A | Discharge status: Home / Self Care | Payer: Medicare Other | Source: Ambulatory Visit | Attending: Radiation Oncology | Admitting: Radiation Oncology

## 2020-03-31 DIAGNOSIS — C01 Malignant neoplasm of base of tongue: Secondary | ICD-10-CM | POA: Diagnosis not present

## 2020-04-01 ENCOUNTER — Other Ambulatory Visit: Payer: Self-pay

## 2020-04-01 ENCOUNTER — Telehealth: Payer: Self-pay

## 2020-04-01 ENCOUNTER — Ambulatory Visit
Admission: RE | Admit: 2020-04-01 | Discharge: 2020-04-01 | Disposition: A | Discharge status: Home / Self Care | Payer: Medicare Other | Source: Ambulatory Visit | Attending: Radiation Oncology | Admitting: Radiation Oncology

## 2020-04-01 DIAGNOSIS — E1169 Type 2 diabetes mellitus with other specified complication: Secondary | ICD-10-CM

## 2020-04-01 DIAGNOSIS — E669 Obesity, unspecified: Secondary | ICD-10-CM

## 2020-04-01 DIAGNOSIS — C01 Malignant neoplasm of base of tongue: Secondary | ICD-10-CM | POA: Diagnosis not present

## 2020-04-01 MED ORDER — FREESTYLE LIBRE 2 SENSOR MISC: 2 refills | Status: DC

## 2020-04-01 MED ORDER — FREESTYLE LIBRE 2 READER DEVI: 1 refills | Status: DC

## 2020-04-01 NOTE — Telephone Encounter (Signed)
Pt called asking for [facility] sensor. It is very hard for him to chk his sugar due to being 1 handed, hurt left hand. Sent meter to pharmacy

## 2020-04-04 ENCOUNTER — Encounter: Payer: Self-pay | Admitting: Hematology and Oncology

## 2020-04-04 ENCOUNTER — Inpatient Hospital Stay: Payer: Medicare Other | Admitting: Nutrition

## 2020-04-04 ENCOUNTER — Ambulatory Visit
Admission: RE | Admit: 2020-04-04 | Discharge: 2020-04-04 | Disposition: A | Discharge status: Home / Self Care | Payer: Medicare Other | Source: Ambulatory Visit | Attending: Radiation Oncology | Admitting: Radiation Oncology

## 2020-04-04 ENCOUNTER — Inpatient Hospital Stay: Payer: Medicare Other

## 2020-04-04 ENCOUNTER — Other Ambulatory Visit: Payer: Self-pay

## 2020-04-04 ENCOUNTER — Inpatient Hospital Stay: Payer: Medicare Other | Admitting: Hematology and Oncology

## 2020-04-04 VITALS — BP 128/72 | HR 70 | Temp 97.4°F | Resp 20 | Ht 69.0 in | Wt 201.4 lb

## 2020-04-04 DIAGNOSIS — N179 Acute kidney failure, unspecified: Secondary | ICD-10-CM | POA: Diagnosis not present

## 2020-04-04 DIAGNOSIS — C01 Malignant neoplasm of base of tongue: Secondary | ICD-10-CM | POA: Diagnosis not present

## 2020-04-04 DIAGNOSIS — I2510 Atherosclerotic heart disease of native coronary artery without angina pectoris: Secondary | ICD-10-CM | POA: Diagnosis not present

## 2020-04-04 DIAGNOSIS — C109 Malignant neoplasm of oropharynx, unspecified: Secondary | ICD-10-CM | POA: Diagnosis not present

## 2020-04-04 DIAGNOSIS — E119 Type 2 diabetes mellitus without complications: Secondary | ICD-10-CM | POA: Diagnosis not present

## 2020-04-04 DIAGNOSIS — G4700 Insomnia, unspecified: Secondary | ICD-10-CM | POA: Diagnosis not present

## 2020-04-04 DIAGNOSIS — Z5111 Encounter for antineoplastic chemotherapy: Secondary | ICD-10-CM | POA: Diagnosis not present

## 2020-04-04 DIAGNOSIS — I10 Essential (primary) hypertension: Secondary | ICD-10-CM | POA: Diagnosis not present

## 2020-04-04 DIAGNOSIS — I4891 Unspecified atrial fibrillation: Secondary | ICD-10-CM | POA: Diagnosis not present

## 2020-04-04 DIAGNOSIS — Z23 Encounter for immunization: Secondary | ICD-10-CM | POA: Diagnosis not present

## 2020-04-04 LAB — CBC WITH DIFFERENTIAL/PLATELET
Abs Immature Granulocytes: 0.02 10*3/uL (ref 0.00–0.07)
Basophils Absolute: 0 10*3/uL (ref 0.0–0.1)
Basophils Relative: 0 %
Eosinophils Absolute: 0 10*3/uL (ref 0.0–0.5)
Eosinophils Relative: 1 %
HCT: 33.3 % — ABNORMAL LOW (ref 39.0–52.0)
Hemoglobin: 11.3 g/dL — ABNORMAL LOW (ref 13.0–17.0)
Immature Granulocytes: 0 %
Lymphocytes Relative: 8 %
Lymphs Abs: 0.5 10*3/uL — ABNORMAL LOW (ref 0.7–4.0)
MCH: 28 pg (ref 26.0–34.0)
MCHC: 33.9 g/dL (ref 30.0–36.0)
MCV: 82.4 fL (ref 80.0–100.0)
Monocytes Absolute: 0.4 10*3/uL (ref 0.1–1.0)
Monocytes Relative: 8 %
Neutro Abs: 4.9 10*3/uL (ref 1.7–7.7)
Neutrophils Relative %: 83 %
Platelets: 148 10*3/uL — ABNORMAL LOW (ref 150–400)
RBC: 4.04 MIL/uL — ABNORMAL LOW (ref 4.22–5.81)
RDW: 13.7 % (ref 11.5–15.5)
WBC: 5.9 10*3/uL (ref 4.0–10.5)
nRBC: 0 % (ref 0.0–0.2)

## 2020-04-04 LAB — BASIC METABOLIC PANEL
Anion gap: 10 (ref 5–15)
BUN: 49 mg/dL — ABNORMAL HIGH (ref 8–23)
CO2: 24 mmol/L (ref 22–32)
Calcium: 8.7 mg/dL — ABNORMAL LOW (ref 8.9–10.3)
Chloride: 97 mmol/L — ABNORMAL LOW (ref 98–111)
Creatinine, Ser: 2.13 mg/dL — ABNORMAL HIGH (ref 0.61–1.24)
GFR, Estimated: 34 mL/min — ABNORMAL LOW (ref >60)
Glucose, Bld: 182 mg/dL — ABNORMAL HIGH (ref 70–99)
Potassium: 4.4 mmol/L (ref 3.5–5.1)
Sodium: 131 mmol/L — ABNORMAL LOW (ref 135–145)

## 2020-04-04 LAB — CMP (CANCER CENTER ONLY)
ALT: 16 U/L (ref 0–44)
AST: 15 U/L (ref 15–41)
Albumin: 2.9 g/dL — ABNORMAL LOW (ref 3.5–5.0)
Alkaline Phosphatase: 58 U/L (ref 38–126)
Anion gap: 9 (ref 5–15)
BUN: 49 mg/dL — ABNORMAL HIGH (ref 8–23)
CO2: 23 mmol/L (ref 22–32)
Calcium: 8.7 mg/dL — ABNORMAL LOW (ref 8.9–10.3)
Chloride: 97 mmol/L — ABNORMAL LOW (ref 98–111)
Creatinine: 2.18 mg/dL — ABNORMAL HIGH (ref 0.61–1.24)
GFR, Estimated: 33 mL/min — ABNORMAL LOW (ref >60)
Glucose, Bld: 183 mg/dL — ABNORMAL HIGH (ref 70–99)
Potassium: 4.3 mmol/L (ref 3.5–5.1)
Sodium: 129 mmol/L — ABNORMAL LOW (ref 135–145)
Total Bilirubin: 0.4 mg/dL (ref 0.3–1.2)
Total Protein: 6.5 g/dL (ref 6.5–8.1)

## 2020-04-04 LAB — MAGNESIUM: Magnesium: 1.3 mg/dL — ABNORMAL LOW (ref 1.7–2.4)

## 2020-04-04 MED ORDER — SODIUM CHLORIDE 0.9 % IV SOLN
Freq: Once | INTRAVENOUS | Status: AC
  Filled 2020-04-04: qty 250

## 2020-04-04 MED ORDER — SODIUM CHLORIDE 0.9 % IV SOLN
10.0000 mg | Freq: Once | INTRAVENOUS | Status: AC
  Administered 2020-04-04: 10 mg via INTRAVENOUS
  Filled 2020-04-04: qty 10

## 2020-04-04 MED ORDER — SODIUM CHLORIDE 0.9 % IV SOLN
139.4000 mg | Freq: Once | INTRAVENOUS | Status: AC
  Administered 2020-04-04: 140 mg via INTRAVENOUS
  Filled 2020-04-04: qty 14

## 2020-04-04 MED ORDER — HEPARIN SOD (PORK) LOCK FLUSH 100 UNIT/ML IV SOLN
500.0000 [IU] | Freq: Once | INTRAVENOUS | Status: AC | PRN
  Administered 2020-04-04: 500 [IU]
  Filled 2020-04-04: qty 5

## 2020-04-04 MED ORDER — PALONOSETRON HCL INJECTION 0.25 MG/5ML
0.2500 mg | Freq: Once | INTRAVENOUS | Status: AC
  Administered 2020-04-04: 0.25 mg via INTRAVENOUS

## 2020-04-04 MED ORDER — PALONOSETRON HCL INJECTION 0.25 MG/5ML
INTRAVENOUS | Status: AC
  Filled 2020-04-04: qty 5

## 2020-04-04 NOTE — Progress Notes (Signed)
Okay to treat with elevated creat per Dr. Chryl Heck

## 2020-04-04 NOTE — Progress Notes (Signed)
Brian [name]  Patient Care Team: [name], MD as PCP - General (Family Medicine) [name], [name], RN as Oncology Nurse Navigator [name], MD as Consulting Physician (Radiation Oncology) [name], MD as Consulting Physician (Hematology and Oncology) [name], RD as Dietitian (Nutrition) [name], [name], [name] as Speech Language Pathologist (Speech Pathology) [name], [name], PT as Physical Therapist (Physical Therapy) Justice Britain as Social Worker (General Practice)  CHIEF COMPLAINTS/PURPOSE OF CONSULTATION:  SCC of oropharynx.  ASSESSMENT & PLAN:  No problem-specific Assessment & Plan notes found for this encounter.  No orders of the defined types were placed in this encounter.  This is a 65 year old male patient with past medical history significant for diabetes, hypertension, coronary artery disease, stroke, atrial fibrillation on anticoagulation, obesity referred to medical oncology for evaluation and recommendations regarding new diagnosis of squamous cell carcinoma of oropharynx.  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 I have reviewed the current recommendations for stage III HPV positive oropharyngeal cancer with the patient today which is primarily concurrent chemoradiation. Given his comorbidities, age, I think he is a good candidate for weekly cisplatin along with radiation.  We have discussed about adverse side effects of cisplatin including but not limited to fatigue, nausea, vomiting, cytopenias, increased risk of infections, permanent hearing loss, nephrotoxicity etc. Baseline audiology referral done by Dr Benjamine Mola, no severe hearing loss noted. Given his creatinine and GFR, we started off with 30 mg/m2 weekly cisplatin. Creatinine and GFR continue to be sub optimal, so we will change chemotherapy to weekly  carboplatin. Discussed adverse effects of carboplatin with the patient including but not limited to fatigue, nausea, vomiting, diarrhea, increased risk of infections and very rarely fatal events. He will start his carboplatin today.  Reviewed his labs, will try to add IVF and Mag today. Ok to proceed with Carboplatin today, will discuss with pharmacy team about role of CMP instead of BMP in future.  2. Insomnia, he is using 10 mg QHS of ambien. He understands that this medication can cause day time drowsiness and will increase risk of falls. Ok to continue during chemoradiation Likely situational.  3. AKI, encouraged hydration. IVF hopefully today or will schedule for tomorrow.  FU scheduled 2/14.  [name] MD   HISTORY OF PRESENTING ILLNESS:   Brian [name] is a 65 y.o. male who presented two-week history of facial swelling in late November/early December 2021. Subsequently, the patient saw Dr. Benjamine Mola, who performed a direct laryngoscopy with biopsy on the date of 02/10/2020. Final pathology revealed: squamous cell carcinoma, P16 positive  He had the following imaging performed  1. Soft tissue neck CT scan performed on 01/28/2020 revealing right glossotonsillar malignancy with bulky bilateral nodal disease. Nodal masses sowed definite extracapsular spread. There was also noted to be incidental advanced cervical spine degeneration with cord compression.  2. PET scan performed on 02/26/2020 revealing a hypermetabolic lesion at the right base of the tongue and right tonsil, consistent with oropharyngeal carcinoma. Carcinoma appeared to be confined to the mucosal surface. There were also noted to bulky bilateral hypermetabolic level II metastatic lymph nodes and smaller inferior hypermetabolic level II metastatic lymph nodes. There was no evidence of metastatic disease in the chest, abdomen, or pelvis. The two small right middle lobe pulmonary nodules present on CT from 2012 were  consistent with benign etiology.  Started radiation on 03/14/2020 Chemo delayed by a  week due to GFR and infusion issues. Started chemotherapy on 03/21/2020 C2 delayed because of AKI Now switched to weekly carboplatin given ongoing AKI.  Interim History    REVIEW OF SYSTEMS:   Constitutional: Denies fevers, chills or abnormal night sweats Eyes: Denies blurriness of vision, double vision or watery eyes Ears, nose, mouth, throat, and face: Denies mucositis or sore throat.  He is able to swallow everything Respiratory: Denies cough, dyspnea or wheezes Cardiovascular: Denies palpitation, chest discomfort or lower extremity swelling Gastrointestinal:  Denies nausea, heartburn or change in bowel habits Skin: Denies abnormal skin rashes Lymphatics: Complains of masses in the neck  neurological:Denies numbness, tingling or new weaknesses Behavioral/Psych: Mood is stable, no new changes  All other systems were reviewed with the patient and are negative.  MEDICAL HISTORY:  Past Medical History:  Diagnosis Date  . Arthritis   . Atrial fibrillation ([place])   . Cardiomyopathy   . Cerebrovascular accident ([place])    2012  . Diabetes mellitus   . Hyperlipidemia   . Hypertension   . Left-sided weakness   . 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 Zigmund Daniel, laser therapy OD  . Seizures ([place])    pt reports this is from low blood sugar    SURGICAL HISTORY: Past Surgical History:  Procedure Laterality Date  . DIRECT LARYNGOSCOPY N/A 02/10/2020   Procedure: DIRECT LARYNGOSCOPY WITH BIOPSY;  Surgeon: [name], MD;  Location: [place];  Service: ENT;  Laterality: N/A;  . Ear surgery as a child    . IR GASTROSTOMY TUBE MOD SED  03/23/2020  . IR IMAGING GUIDED PORT INSERTION  03/09/2020  . TONSILLECTOMY      SOCIAL HISTORY: Social History   Socioeconomic History  . Marital status: Divorced    Spouse name: Not on file  . Number of children: 2  . Years of education: Not on file  .  Highest education level: Not on file  Occupational History  . Occupation: Disabled  Tobacco Use  . Smoking status: Never Smoker  . Smokeless tobacco: Never Used  Vaping Use  . Vaping Use: Never used  Substance and Sexual Activity  . Alcohol use: Yes    Alcohol/week: 0.0 standard drinks    Comment: seldom  . Drug use: Yes    Frequency: 7.0 times per week    Comment: marijauna for arthritis, last 03-13-20  . Sexual activity: Not Currently  Other Topics Concern  . Not on file  Social History Narrative   He has children.   Social Determinants of Health   Financial Resource Strain: Low Risk   . Difficulty of Paying Living Expenses: Not very hard  Food Insecurity: No Food Insecurity  . Worried About Charity fundraiser in the Last Year: Never true  . Ran Out of Food in the Last Year: Never true  Transportation Needs: No Transportation Needs  . Lack of Transportation (Medical): No  . Lack of Transportation (Non-Medical): No  Physical Activity: Not on file  Stress: Stress Concern Present  . Feeling of Stress : To some extent  Social Connections: Socially Isolated  . Frequency of Communication with Friends and Family: More than three times a week  . Frequency of Social Gatherings with Friends and Family: More than three times a week  . Attends Religious Services: Never  . Active Member of Clubs or Organizations: No  . Attends Archivist Meetings: Never  . Marital Status: Divorced  Human resources officer Violence: Not on  file    FAMILY HISTORY: Family History  Problem Relation Age of Onset  . Hyperlipidemia Father   . Hypertension Father   . Coronary artery disease Neg Hx        no early CAD.  Marland Kitchen Colon cancer Neg Hx   . Stomach cancer Neg Hx   . Rectal cancer Neg Hx   . Esophageal cancer Neg Hx     ALLERGIES:  is allergic to codeine.  MEDICATIONS:  Current Outpatient Medications  Medication Sig Dispense Refill  . amLODipine (NORVASC) 10 MG tablet Take 1 tablet  (10 mg total) by mouth daily. 90 tablet 1  . atorvastatin (LIPITOR) 20 MG tablet TAKE 1 TABLET (20 MG TOTAL) DAILY BY MOUTH. 90 tablet 3  . Blood Glucose Monitoring Suppl (ONETOUCH VERIO IQ SYSTEM) w/Device KIT 1 each by Does not apply route 2 (two) times daily. 1 kit 0  . carvedilol (COREG) 25 MG tablet TAKE 1 TABLET BY MOUTH TWICE A DAY WITH A MEAL (Patient taking differently: Take 25 mg by mouth 2 (two) times daily with a meal.) 180 tablet 3  . chlorthalidone (HYGROTON) 25 MG tablet TAKE 1 TABLET BY MOUTH EVERY DAY 90 tablet 1  . cloNIDine (CATAPRES) 0.1 MG tablet Take 1 tablet (0.1 mg total) by mouth 2 (two) times daily. 60 tablet 3  . Continuous Blood Gluc Receiver (FREESTYLE LIBRE 2 READER) DEVI Use as directed to monitor blood glucose continuously DX E11.65 1 each 1  . Continuous Blood Gluc Sensor (FREESTYLE LIBRE 2 SENSOR) MISC Use as directed to monitor blood glucose continuously DX E11.65 2 each 2  . dexamethasone (DECADRON) 4 MG tablet Take 2 tablets (8 mg total) by mouth daily. Take daily x 3 days starting the day after cisplatin chemotherapy. Take with food. 30 tablet 1  . furosemide (LASIX) 20 MG tablet TAKE 1 TABLET (20 MG TOTAL) BY MOUTH DAILY AS NEEDED FOR FLUID. 90 tablet 1  . glucose blood test strip 1 each by Other route as needed for other. Use as instructed 100 each 11  . hydrALAZINE (APRESOLINE) 50 MG tablet TAKE 1 TABLET BY MOUTH THREE TIMES A DAY (Patient taking differently: Take 50 mg by mouth 3 (three) times daily.) 270 tablet 1  . Insulin Pen Needle (B-D UF III MINI PEN NEEDLES) 31G X 5 MM MISC USE DAILY WITH INSULIN 100 each 5  . Insulin Syringe-Needle U-100 (INSULIN SYRINGE .3CC/31GX5/16") 31G X 5/16" 0.3 ML MISC Use twice daily to inject insulin. 100 each 10  . LEVEMIR FLEXTOUCH 100 UNIT/ML FlexPen INJECT 70 UNITS UNDER THE SKIN EVERY MORNING AND 40 UNITS AT BEDTIME 15 mL 3  . lidocaine (XYLOCAINE) 2 % solution Patient: Mix 1part 2% viscous lidocaine, 1part H20. Swish &  swallow 31m of diluted mixture, 352m before meals and at bedtime, up to QID 200 mL 4  . lidocaine-prilocaine (EMLA) cream Apply to affected area once 30 g 3  . LORazepam (ATIVAN) 0.5 MG tablet Take 1 tablet (0.5 mg total) by mouth every 6 (six) hours as needed (Nausea or vomiting). 30 tablet 0  . losartan (COZAAR) 50 MG tablet Take 1 tablet (50 mg total) by mouth daily. 90 tablet 3  . ondansetron (ZOFRAN) 8 MG tablet Take 1 tablet (8 mg total) by mouth 2 (two) times daily as needed. Start on the third day after cisplatin chemotherapy. 30 tablet 1  . OneTouch Delica Lancets 3370WISC 1 each by Does not apply route 2 (two) times daily. 200 each  11  . pioglitazone (ACTOS) 15 MG tablet Take 1 tablet (15 mg total) by mouth daily. 90 tablet 2  . prochlorperazine (COMPAZINE) 10 MG tablet Take 1 tablet (10 mg total) by mouth every 6 (six) hours as needed (Nausea or vomiting). 30 tablet 1  . warfarin (COUMADIN) 10 MG tablet Take 1 tablet (10 mg total) by mouth daily. 90 tablet 3  . zolpidem (AMBIEN) 5 MG tablet Take 1 tablet (5 mg total) by mouth at bedtime as needed for sleep. 30 tablet 0   No current facility-administered medications for this visit.     PHYSICAL EXAMINATION: ECOG PERFORMANCE STATUS: 0 - Asymptomatic  Vitals:   04/04/20 0846  BP: 128/72  Pulse: 70  Resp: 20  Temp: (!) 97.4 F (36.3 C)  SpO2: 100%   Filed Weights   04/04/20 0846  Weight: 201 lb 6.4 oz (91.4 kg)   Physical Exam Constitutional:      Appearance: Normal appearance.  HENT:     Head: Normocephalic and atraumatic.     Nose: Nose normal.     Mouth/Throat:     Mouth: Mucous membranes are moist.  Cardiovascular:     Rate and Rhythm: Normal rate and regular rhythm.     Pulses: Normal pulses.     Heart sounds: Normal heart sounds.  Pulmonary:     Effort: Pulmonary effort is normal.     Breath sounds: Normal breath sounds.  Abdominal:     General: Abdomen is flat.     Palpations: Abdomen is soft.   Musculoskeletal:        General: No swelling. Normal range of motion.     Cervical back: Normal range of motion and neck supple.  Lymphadenopathy:     Cervical: Cervical adenopathy (Decreasing in size.) present.  Skin:    General: Skin is warm and dry.  Neurological:     General: No focal deficit present.     Mental Status: He is alert and oriented to person, place, and time.  Psychiatric:        Mood and Affect: Mood normal.        Behavior: Behavior normal.      LABORATORY DATA:  I have reviewed the data as listed Lab Results  Component Value Date   WBC 5.9 04/04/2020   HGB 11.3 (L) 04/04/2020   HCT 33.3 (L) 04/04/2020   MCV 82.4 04/04/2020   PLT 148 (L) 04/04/2020     Chemistry      Component Value Date/Time   NA 131 (L) 04/04/2020 0814   K 4.4 04/04/2020 0814   CL 97 (L) 04/04/2020 0814   CO2 24 04/04/2020 0814   BUN 49 (H) 04/04/2020 0814   CREATININE 2.13 (H) 04/04/2020 0814   CREATININE 1.41 (H) 03/15/2020 0822   CREATININE 1.23 11/30/2019 0915      Component Value Date/Time   CALCIUM 8.7 (L) 04/04/2020 0814   ALKPHOS 62 09/25/2016 0914   AST 11 11/30/2019 0915   ALT 14 11/30/2019 0915   BILITOT 0.5 11/30/2019 0915       RADIOGRAPHIC STUDIES: I have personally reviewed the radiological images as listed and agreed with the findings in the report. IR Gastrostomy Tube  Result Date: 03/23/2020 INDICATION: 65 year old male with oropharyngeal cancer requiring percutaneous enteric access. EXAM: PERC PLACEMENT GASTROSTOMY MEDICATIONS: Ancef 2 gm IV; Antibiotics were administered within 1 hour of the procedure. ANESTHESIA/SEDATION: Versed 2 mg IV; Fentanyl 50 mcg IV Moderate Sedation Time:  15 The  patient was continuously monitored during the procedure by the interventional radiology nurse under my direct supervision. CONTRAST:  57m OMNIPAQUE IOHEXOL 300 MG/ML SOLN - administered into the gastric lumen. FLUOROSCOPY TIME:  Fluoroscopy Time: 1 minutes 18 seconds (31  mGy). COMPLICATIONS: None immediate. PROCEDURE: Informed written consent was obtained from the patient after a thorough discussion of the procedural risks, benefits and alternatives. All questions were addressed. Maximal Sterile Barrier Technique was utilized including caps, mask, sterile gowns, sterile gloves, sterile drape, hand hygiene and skin antiseptic. A timeout was performed prior to the initiation of the procedure. The patient was placed on the procedure table in the supine position. Pre-procedure abdominal film confirmed visualization of the transverse colon. An angled 5-French catheter was passed through the nares into the stomach. The patient was prepped and draped in usual sterile fashion. The stomach was insufflated with air via the indwelling nasogastric tube. Under fluoroscopy, a puncture site was selected and local analgesia achieved with 1% lidocaine infiltrated subcutaneously. Under fluoroscopic guidance, a gastropexy needle was passed into the stomach and the T-bar suture was released. Entry into the stomach was confirmed with fluoroscopy, aspiration of air, and injection of contrast material. This was repeated with an additional gastropexy suture (for a total of 2 fasteners). At the center of these gastropexy sutures, a dermatotomy was performed. An 18 gauge needle was passed into the stomach at the site of this dermatotomy, and position within the gastric lumen again confirmed under fluoroscopy using aspiration of air and contrast injection. An Amplatz guidewire was passed through this needle and intraluminal placement within the stomach was confirmed by fluoroscopy. The needle was removed. Over the guidewire, the percutaneous tract was dilated using a 10 mm non-compliant balloon. The balloon was deflated, then pushed into the gastric lumen followed in concert by the 20 Fr gastrostomy tube. The retention balloon of the percutaneous gastrostomy tube was inflated with 20 mL of sterile water. The  tube was withdrawn until the retention balloon was at the edge of the gastric lumen. The external bumper was brought to the abdominal wall. Contrast was injected through the gastrostomy tube, confirming intraluminal positioning. The patient tolerated the procedure well without any immediate post-procedural complications. IMPRESSION: Technically successful placement of 20 Fr gastrostomy tube. DRuthann Cancer MD [facility] Hospital[facility] Electronically Signed   By: DRuthann CancerMD   On: 03/23/2020 13:19   IR IMAGING GUIDED PORT INSERTION  Result Date: 03/09/2020 INDICATION: 65year old male referred for port catheter EXAM: IMAGE GUIDED PORT CATHETER MEDICATIONS: 2 g Ancef; The antibiotic was administered within an appropriate time interval prior to skin puncture. ANESTHESIA/SEDATION: Moderate (conscious) sedation was employed during this procedure. A total of Versed 3.0 mg and Fentanyl 100 mcg was administered intravenously. Moderate Sedation Time: 20 minutes. The patient's level of consciousness and vital signs were monitored continuously by radiology nursing throughout the procedure under my direct supervision. FLUOROSCOPY TIME:  Fluoroscopy Time: 0 minutes 6 seconds (2 mGy). COMPLICATIONS: None PROCEDURE: The procedure, risks, benefits, and alternatives were explained to the patient. Questions regarding the procedure were encouraged and answered. The patient understands and consents to the procedure. Ultrasound survey was performed with images stored and sent to PACs. The right neck and chest was prepped with chlorhexidine, and draped in the usual sterile fashion using maximum barrier technique (cap and mask, sterile gown, sterile gloves, large sterile sheet, hand hygiene and cutaneous antiseptic). Antibiotic prophylaxis was provided with 2.0g Ancef administered IV one hour prior to skin  incision. Local anesthesia was attained by infiltration with 1% lidocaine without  epinephrine. Ultrasound demonstrated patency of the right internal jugular vein, and this was documented with an image. Under real-time ultrasound guidance, this vein was accessed with a 21 gauge micropuncture needle and image documentation was performed. A small dermatotomy was made at the access site with an 11 scalpel. A 0.018" wire was advanced into the SVC and used to estimate the length of the internal catheter. The access needle exchanged for a 80F micropuncture vascular sheath. The 0.018" wire was then removed and a 0.035" wire advanced into the IVC. An appropriate location for the subcutaneous reservoir was selected below the clavicle and an incision was made through the skin and underlying soft tissues. The subcutaneous tissues were then dissected using a combination of blunt and sharp surgical technique and a pocket was formed. A single lumen power injectable portacatheter was then tunneled through the subcutaneous tissues from the pocket to the dermatotomy and the port reservoir placed within the subcutaneous pocket. The venous access site was then serially dilated and a peel away vascular sheath placed over the wire. The wire was removed and the port catheter advanced into position under fluoroscopic guidance. The catheter tip is positioned in the cavoatrial junction. This was documented with a spot image. The portacatheter was then tested and found to flush and aspirate well. The port was flushed with saline followed by 100 units/mL heparinized saline. The pocket was then closed in two layers using first subdermal inverted interrupted absorbable sutures followed by a running subcuticular suture. The epidermis was then sealed with Dermabond. The dermatotomy at the venous access site was also seal with Dermabond. Patient tolerated the procedure well and remained hemodynamically stable throughout. No complications encountered and no significant blood loss encountered IMPRESSION: Status post right IJ port  catheter placement. Catheter ready for use. Signed, Dulcy Fanny. Dellia Nims, RPVI [facility] Radiology Electronically Signed   By: Corrie Mckusick D.O.   On: 03/09/2020 12:45   I reviewed the image myself.  Noted hypermetabolic lesions at the base of tongue on the right side and right tonsil as well as the bilateral bulky lymphadenopathy.  02/10/2020  SURGICAL PATHOLOGY    Reason for Addendum #1: Immunohistochemistry results   Clinical History: throat cancer (cm)    FINAL MICROSCOPIC DIAGNOSIS:   A. OROPHARYNGEAL, RIGHT, BIOPSY:  - Squamous cell carcinoma  - See comment   COMMENT:   P16 immunohistochemistry is pending and will be reported in an addendum.  Dr. Tresa Moore reviewed the case and agrees with the above diagnosis. Dr.  Benjamine Mola was notified of these results on February 11, 2020.   GROSS DESCRIPTION:   The specimen is received in saline and consists of a 1.6 x 1.5 x 0.3 cm  aggregate of tan-pink soft tissue. The specimen is entirely submitted  in 1 cassette. Craig Staggers 02/10/2020)    Final Diagnosis performed by Thressa Sheller, MD.  Electronically signed  02/11/2020  Technical and / or Professional components performed at [facility]. [facility], [place] [address], [place], [place] [address].  Immunohistochemistry Technical component (if applicable) was performed  at [facility]. [address] [place],  [place], [place] [address].  IMMUNOHISTOCHEMISTRY DISCLAIMER (if applicable):  Some of these immunohistochemical stains may have been developed and the  performance characteristics determine by [facility]. Some  may not have been cleared or approved by the U.S. Food and Drug  Administration. The  FDA has determined that such clearance or approval  is not necessary. This test is used for clinical purposes. It should not  be regarded as investigational or for research. This laboratory is   certified under the [place]  (CLIA-88) as qualified to perform high complexity clinical laboratory  testing. The controls stained appropriately.   ADDENDUM:   P16 immunohistochemistry is POSITIVE.   Reviewed labs. AKI and low Mg. Will hydrate him and provide Mag replacement.  All questions were answered. The patient knows to call the clinic with any problems, questions or concerns. I spent 30 minutes in the care of this patient including H and P, review of records, counseling, documentation and coordination of care.     [name], MD 04/04/2020 10:08 AM

## 2020-04-04 NOTE — Progress Notes (Signed)
RN spoke with Andrea-Dr. Rob Hickman nurse, states okay to treat while LFT's result and with Creat 2.13

## 2020-04-04 NOTE — Progress Notes (Signed)
Received verbal order for Mag sulfate from Dr. Chryl Heck. 2 grams IV order placed.

## 2020-04-04 NOTE — Progress Notes (Signed)
Nutrition follow-up completed with patient during infusion for tongue cancer. Weight decreased slightly to 201.4 pounds February 14. Noted labs: Sodium 131, glucose 182, BUN 49, creatinine 2.13, and magnesium 1.3. Patient reports he has been flushing his feeding tube with water and having no difficulty. He still is not drinking Ensure at home.  Reports he has plenty of supplies. States he continues to eat a lot and notices no difference in his ability to chew and swallow.  Estimated nutrition needs: 2200-2500 cal, 120-135 g protein, 2.4 L fluid.  Nutrition diagnosis: Inadequate oral intake stable.  Intervention: Educated patient on the importance of increasing calories and protein to minimize further weight loss. Recommended patient drink 1-2 cartons of Ensure Plus daily between meals. Increase free water flushes via PEG to 8 ounces daily to provide additional hydration. Patient verbalizes understanding and agreement.  Monitoring, evaluation, goals: Patient will tolerate increased calories and protein to minimize weight loss.  Next visit: Monday, February 21 during infusion.  **Disclaimer: This note was dictated with voice recognition software. Similar sounding words can inadvertently be transcribed and this note may contain transcription errors which may not have been corrected upon publication of note.**

## 2020-04-04 NOTE — Progress Notes (Signed)
Order placed for CMP and okay to treat without LFT's for today per Dr. Chryl Heck.

## 2020-04-04 NOTE — Progress Notes (Signed)
Received verbal order for 1L NS which will be added on to infusion appt today and Teryn RN aware.

## 2020-04-04 NOTE — Patient Instructions (Signed)
[facility] Discharge Instructions for Patients Receiving Chemotherapy  Today you received the following chemotherapy agents Carboplatinin  To help prevent nausea and vomiting after your treatment, we encourage you to take your nausea medication as directed.  If you develop nausea and vomiting that is not controlled by your nausea medication, call the clinic.   BELOW ARE SYMPTOMS THAT SHOULD BE REPORTED IMMEDIATELY:  *FEVER GREATER THAN 100.5 F  *CHILLS WITH OR WITHOUT FEVER  NAUSEA AND VOMITING THAT IS NOT CONTROLLED WITH YOUR NAUSEA MEDICATION  *UNUSUAL SHORTNESS OF BREATH  *UNUSUAL BRUISING OR BLEEDING  TENDERNESS IN MOUTH AND THROAT WITH OR WITHOUT PRESENCE OF ULCERS  *URINARY PROBLEMS  *BOWEL PROBLEMS  UNUSUAL RASH Items with * indicate a potential emergency and should be followed up as soon as possible.  Feel free to call the clinic should you have any questions or concerns. The clinic phone number is [phone].  Please show the [facility] at check-in to the Emergency Department and triage nurse.

## 2020-04-05 ENCOUNTER — Ambulatory Visit
Admission: RE | Admit: 2020-04-05 | Discharge: 2020-04-05 | Disposition: A | Discharge status: Home / Self Care | Payer: Medicare Other | Source: Ambulatory Visit | Attending: Radiation Oncology | Admitting: Radiation Oncology

## 2020-04-05 ENCOUNTER — Other Ambulatory Visit: Payer: Self-pay

## 2020-04-05 ENCOUNTER — Inpatient Hospital Stay: Payer: Medicare Other

## 2020-04-05 ENCOUNTER — Ambulatory Visit: Payer: Medicare Other | Admitting: Family Medicine

## 2020-04-05 ENCOUNTER — Telehealth: Payer: Self-pay | Admitting: *Deleted

## 2020-04-05 DIAGNOSIS — I10 Essential (primary) hypertension: Secondary | ICD-10-CM | POA: Diagnosis not present

## 2020-04-05 DIAGNOSIS — C01 Malignant neoplasm of base of tongue: Secondary | ICD-10-CM | POA: Diagnosis not present

## 2020-04-05 DIAGNOSIS — Z23 Encounter for immunization: Secondary | ICD-10-CM | POA: Diagnosis not present

## 2020-04-05 DIAGNOSIS — N179 Acute kidney failure, unspecified: Secondary | ICD-10-CM | POA: Diagnosis not present

## 2020-04-05 DIAGNOSIS — I2510 Atherosclerotic heart disease of native coronary artery without angina pectoris: Secondary | ICD-10-CM | POA: Diagnosis not present

## 2020-04-05 DIAGNOSIS — I4891 Unspecified atrial fibrillation: Secondary | ICD-10-CM | POA: Diagnosis not present

## 2020-04-05 DIAGNOSIS — E119 Type 2 diabetes mellitus without complications: Secondary | ICD-10-CM | POA: Diagnosis not present

## 2020-04-05 DIAGNOSIS — G4700 Insomnia, unspecified: Secondary | ICD-10-CM | POA: Diagnosis not present

## 2020-04-05 DIAGNOSIS — Z5111 Encounter for antineoplastic chemotherapy: Secondary | ICD-10-CM | POA: Diagnosis not present

## 2020-04-05 DIAGNOSIS — C109 Malignant neoplasm of oropharynx, unspecified: Secondary | ICD-10-CM | POA: Diagnosis not present

## 2020-04-05 MED ORDER — SODIUM CHLORIDE 0.9% FLUSH
10.0000 mL | INTRAVENOUS | Status: AC | PRN
  Administered 2020-04-05: 10 mL
  Filled 2020-04-05: qty 10

## 2020-04-05 MED ORDER — SONAFINE EX EMUL
1.0000 "application " | Freq: Two times a day (BID) | CUTANEOUS | Status: DC
  Administered 2020-04-05: 1 via TOPICAL

## 2020-04-05 MED ORDER — HEPARIN SOD (PORK) LOCK FLUSH 100 UNIT/ML IV SOLN
500.0000 [IU] | INTRAVENOUS | Status: AC | PRN
  Administered 2020-04-05: 500 [IU]
  Filled 2020-04-05: qty 5

## 2020-04-05 MED ORDER — MAGNESIUM SULFATE 2 GM/50ML IV SOLN
INTRAVENOUS | Status: AC
  Filled 2020-04-05: qty 50

## 2020-04-05 MED ORDER — MAGNESIUM SULFATE 2 GM/50ML IV SOLN
2.0000 g | Freq: Once | INTRAVENOUS | Status: AC
  Administered 2020-04-05: 2 g via INTRAVENOUS

## 2020-04-05 NOTE — Patient Instructions (Signed)
Magnesium Sulfate injection What is this medicine? MAGNESIUM SULFATE (mag NEE zee um SUL fate) is an electrolyte injection commonly used to treat low magnesium levels in your blood. It is also used to prevent or control seizures in women with preeclampsia or eclampsia. This medicine may be used for other purposes; ask your health care provider or pharmacist if you have questions. What should I tell my health care provider before I take this medicine? They need to know if you have any of these conditions:  heart disease  history of irregular heart beat  kidney disease  an unusual or allergic reaction to magnesium sulfate, medicines, foods, dyes, or preservatives  pregnant or trying to get pregnant  breast-feeding How should I use this medicine? This medicine is for infusion into a vein. It is given by a health care professional in a hospital or clinic setting. Talk to your pediatrician regarding the use of this medicine in children. While this drug may be prescribed for selected conditions, precautions do apply. Overdosage: If you think you have taken too much of this medicine contact a poison control center or emergency room at once. NOTE: This medicine is only for you. Do not share this medicine with others. What if I miss a dose? This does not apply. What may interact with this medicine? This medicine may interact with the following medications:  certain medicines for anxiety or sleep  certain medicines for seizures like phenobarbital  digoxin  medicines that relax muscles for surgery  narcotic medicines for pain This list may not describe all possible interactions. Give your health care provider a list of all the medicines, herbs, non-prescription drugs, or dietary supplements you use. Also tell them if you smoke, drink alcohol, or use illegal drugs. Some items may interact with your medicine. What should I watch for while using this medicine? Your condition will be  monitored carefully while you are receiving this medicine. You may need blood work done while you are receiving this medicine. What side effects may I notice from receiving this medicine? Side effects that you should report to your doctor or health care professional as soon as possible:  allergic reactions like skin rash, itching or hives, swelling of the face, lips, or tongue  facial flushing  muscle weakness  signs and symptoms of low blood pressure like dizziness; feeling faint or lightheaded, falls; unusually weak or tired  signs and symptoms of a dangerous change in heartbeat or heart rhythm like chest pain; dizziness; fast or irregular heartbeat; palpitations; breathing problems  sweating This list may not describe all possible side effects. Call your doctor for medical advice about side effects. You may report side effects to FDA at 1-800-FDA-1088. Where should I keep my medicine? This drug is given in a hospital or clinic and will not be stored at home. NOTE: This sheet is a summary. It may not cover all possible information. If you have questions about this medicine, talk to your doctor, pharmacist, or health care provider.  2021 Elsevier/Gold Standard (2015-08-24 12:31:42)

## 2020-04-05 NOTE — Telephone Encounter (Signed)
CALLED PATIENT TO ASK ABOUT COMING IN TOMORROW (04/06/20)- ARRIVAL TIME- 7:45 AM @ [facility], LVM FOR A RETURN CALL

## 2020-04-06 ENCOUNTER — Ambulatory Visit
Admission: RE | Admit: 2020-04-06 | Discharge: 2020-04-06 | Disposition: A | Discharge status: Home / Self Care | Payer: Medicare Other | Source: Ambulatory Visit | Attending: Radiation Oncology | Admitting: Radiation Oncology

## 2020-04-06 ENCOUNTER — Other Ambulatory Visit: Payer: Self-pay

## 2020-04-06 VITALS — BP 131/73 | HR 72 | Temp 97.0°F | Resp 18 | Wt 205.4 lb

## 2020-04-06 DIAGNOSIS — C01 Malignant neoplasm of base of tongue: Secondary | ICD-10-CM

## 2020-04-06 LAB — BUN & CREATININE (CHCC)
BUN: 48 mg/dL — ABNORMAL HIGH (ref 8–23)
Creatinine: 2.21 mg/dL — ABNORMAL HIGH (ref 0.61–1.24)
GFR, Estimated: 32 mL/min — ABNORMAL LOW (ref >60)

## 2020-04-07 ENCOUNTER — Other Ambulatory Visit: Payer: Self-pay

## 2020-04-07 ENCOUNTER — Inpatient Hospital Stay: Payer: Medicare Other

## 2020-04-07 ENCOUNTER — Ambulatory Visit (INDEPENDENT_AMBULATORY_CARE_PROVIDER_SITE_OTHER): Payer: Medicare Other | Admitting: Family Medicine

## 2020-04-07 ENCOUNTER — Ambulatory Visit
Admission: RE | Admit: 2020-04-07 | Discharge: 2020-04-07 | Disposition: A | Discharge status: Home / Self Care | Payer: Medicare Other | Source: Ambulatory Visit | Attending: Radiation Oncology | Admitting: Radiation Oncology

## 2020-04-07 ENCOUNTER — Inpatient Hospital Stay (HOSPITAL_BASED_OUTPATIENT_CLINIC_OR_DEPARTMENT_OTHER): Payer: Medicare Other | Admitting: Hematology and Oncology

## 2020-04-07 ENCOUNTER — Encounter: Payer: Self-pay | Admitting: Family Medicine

## 2020-04-07 ENCOUNTER — Encounter: Payer: Self-pay | Admitting: Hematology and Oncology

## 2020-04-07 VITALS — BP 141/70 | HR 74 | Temp 99.0°F | Resp 19 | Ht 69.0 in | Wt 204.7 lb

## 2020-04-07 VITALS — BP 140/88 | HR 84 | Temp 98.1°F | Resp 16 | Ht 69.0 in | Wt 206.0 lb

## 2020-04-07 DIAGNOSIS — C109 Malignant neoplasm of oropharynx, unspecified: Secondary | ICD-10-CM | POA: Diagnosis not present

## 2020-04-07 DIAGNOSIS — Z5111 Encounter for antineoplastic chemotherapy: Secondary | ICD-10-CM | POA: Diagnosis not present

## 2020-04-07 DIAGNOSIS — C01 Malignant neoplasm of base of tongue: Secondary | ICD-10-CM

## 2020-04-07 DIAGNOSIS — I10 Essential (primary) hypertension: Secondary | ICD-10-CM | POA: Diagnosis not present

## 2020-04-07 DIAGNOSIS — N179 Acute kidney failure, unspecified: Secondary | ICD-10-CM | POA: Diagnosis not present

## 2020-04-07 DIAGNOSIS — I4891 Unspecified atrial fibrillation: Secondary | ICD-10-CM

## 2020-04-07 DIAGNOSIS — F5109 Other insomnia not due to a substance or known physiological condition: Secondary | ICD-10-CM

## 2020-04-07 DIAGNOSIS — Z23 Encounter for immunization: Secondary | ICD-10-CM | POA: Diagnosis not present

## 2020-04-07 DIAGNOSIS — E669 Obesity, unspecified: Secondary | ICD-10-CM | POA: Diagnosis not present

## 2020-04-07 DIAGNOSIS — G4700 Insomnia, unspecified: Secondary | ICD-10-CM | POA: Diagnosis not present

## 2020-04-07 DIAGNOSIS — I2510 Atherosclerotic heart disease of native coronary artery without angina pectoris: Secondary | ICD-10-CM | POA: Diagnosis not present

## 2020-04-07 DIAGNOSIS — E1169 Type 2 diabetes mellitus with other specified complication: Secondary | ICD-10-CM | POA: Diagnosis not present

## 2020-04-07 DIAGNOSIS — E119 Type 2 diabetes mellitus without complications: Secondary | ICD-10-CM | POA: Diagnosis not present

## 2020-04-07 LAB — PT WITH INR/FINGERSTICK
INR, fingerstick: 3.4 ratio — ABNORMAL HIGH
PT, fingerstick: 41 s — ABNORMAL HIGH (ref 10.5–13.1)

## 2020-04-07 LAB — CBC WITH DIFFERENTIAL/PLATELET
Abs Immature Granulocytes: 0.01 10*3/uL (ref 0.00–0.07)
Basophils Absolute: 0 10*3/uL (ref 0.0–0.1)
Basophils Relative: 0 %
Eosinophils Absolute: 0 10*3/uL (ref 0.0–0.5)
Eosinophils Relative: 1 %
HCT: 29.7 % — ABNORMAL LOW (ref 39.0–52.0)
Hemoglobin: 10.1 g/dL — ABNORMAL LOW (ref 13.0–17.0)
Immature Granulocytes: 0 %
Lymphocytes Relative: 9 %
Lymphs Abs: 0.4 10*3/uL — ABNORMAL LOW (ref 0.7–4.0)
MCH: 27.7 pg (ref 26.0–34.0)
MCHC: 34 g/dL (ref 30.0–36.0)
MCV: 81.6 fL (ref 80.0–100.0)
Monocytes Absolute: 0.4 10*3/uL (ref 0.1–1.0)
Monocytes Relative: 11 %
Neutro Abs: 3.3 10*3/uL (ref 1.7–7.7)
Neutrophils Relative %: 79 %
Platelets: 127 10*3/uL — ABNORMAL LOW (ref 150–400)
RBC: 3.64 MIL/uL — ABNORMAL LOW (ref 4.22–5.81)
RDW: 13.8 % (ref 11.5–15.5)
WBC: 4.2 10*3/uL (ref 4.0–10.5)
nRBC: 0 % (ref 0.0–0.2)

## 2020-04-07 LAB — BASIC METABOLIC PANEL
Anion gap: 6 (ref 5–15)
BUN: 50 mg/dL — ABNORMAL HIGH (ref 8–23)
CO2: 25 mmol/L (ref 22–32)
Calcium: 8.5 mg/dL — ABNORMAL LOW (ref 8.9–10.3)
Chloride: 97 mmol/L — ABNORMAL LOW (ref 98–111)
Creatinine, Ser: 2.13 mg/dL — ABNORMAL HIGH (ref 0.61–1.24)
GFR, Estimated: 34 mL/min — ABNORMAL LOW (ref >60)
Glucose, Bld: 138 mg/dL — ABNORMAL HIGH (ref 70–99)
Potassium: 4.4 mmol/L (ref 3.5–5.1)
Sodium: 128 mmol/L — ABNORMAL LOW (ref 135–145)

## 2020-04-07 LAB — MAGNESIUM: Magnesium: 1.4 mg/dL — ABNORMAL LOW (ref 1.7–2.4)

## 2020-04-07 MED ORDER — MAGNESIUM SULFATE 2 GM/50ML IV SOLN
2.0000 g | Freq: Once | INTRAVENOUS | Status: AC
  Administered 2020-04-07: 2 g via INTRAVENOUS

## 2020-04-07 MED ORDER — MAGNESIUM SULFATE 50 % IJ SOLN
2.0000 g | Freq: Once | INTRAMUSCULAR | Status: DC
Start: 2020-04-07 — End: 2020-04-07

## 2020-04-07 MED ORDER — SODIUM CHLORIDE 0.9 % IV SOLN
Freq: Once | INTRAVENOUS | Status: AC
  Filled 2020-04-07: qty 250

## 2020-04-07 MED ORDER — SODIUM CHLORIDE 0.9% FLUSH
10.0000 mL | Freq: Once | INTRAVENOUS | Status: AC
  Administered 2020-04-07: 10 mL via INTRAVENOUS
  Filled 2020-04-07: qty 10

## 2020-04-07 MED ORDER — HEPARIN SOD (PORK) LOCK FLUSH 100 UNIT/ML IV SOLN
500.0000 [IU] | Freq: Once | INTRAVENOUS | Status: AC
  Administered 2020-04-07: 500 [IU] via INTRAVENOUS
  Filled 2020-04-07: qty 5

## 2020-04-07 MED ORDER — MAGNESIUM SULFATE 2 GM/50ML IV SOLN
INTRAVENOUS | Status: AC
  Filled 2020-04-07: qty 50

## 2020-04-07 NOTE — Progress Notes (Signed)
Subjective:    Patient ID: Brian [name], male    DOB: Sep 14, 1955, 65 y.o.   MRN: [id number]  03/03/20 Since I last saw the patient, he was diagnosed with squamous cell carcinoma.  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  He met with radiation oncologist recently.  At their office his systolic blood pressure was greater than 200.  Therefore I had him come in today urgently to be reassessed.  In his left arm his blood pressure was 190/100.  In his right arm it was 170/98.  He is consistently taking losartan 50 mg a day, amlodipine 10 mg a day, carvedilol 25 mg twice a day, hydralazine 50 mg 3 times a day.  He states that he is not missing any of his medication.  He has not been taking losartan.  He is also not taking Trulicity because it was too expensive.  At that time, my plan was: Blood pressure is dangerously high.  There is no evidence of endorgan damage.  He denies any chest pain shortness of breath or dyspnea on exertion.  He denies any strokelike symptoms.  He denies any oliguria or hematuria.  Our lab person is out sick today with COVID therefore I cannot check renal function or lab work.  I will start the patient on hydrochlorothiazide 25 mg a day and recheck blood pressure next week.  He is overdue to recheck an INR at that visit.  We will need to adjust his diabetic medication based on his sugars as he starts radiation therapy for the cancer in his neck.  I anticipate that his oral intake will decrease and this will likely affect his sugars and may cause him to start to drop.  03/10/20 Patient's blood pressure today is still extremely high at 182/100.  I confirmed this myself.  He denies any chest pain shortness of breath or dyspnea on exertion.  He has been taking the chlorthalidone.  He is not checking his blood sugars.  Oncology is concerned about hyperglycemia due to the Decadron if they are going to give him with  chemotherapy.  I am concerned that he is going to experience rapidly fluctuating sugars not only due to the steroids but also due to his intake which may be affected by the radiation to treat his neck as I anticipate that his oral intake will drop.  At that time, my plan was: Add clonidine 0.1 mg twice daily and recheck blood pressure in 10 days.  He is off his Coumadin right now because he had a port placed yesterday.  He just started his Coumadin back this morning.  He will need to recheck an INR in 10 days.  Continue Levemir at his current dose however we will start the patient on sliding scale regular insulin.  He is to take 2 units of insulin for every 50 points blood sugar is above 150.  For instance between 150 and 200 he will take 2 units, between 201 and 250 he will take 4 units, between 251 and 300 he will take 6 units, etc.  Also established the patient with our clinical pharmacist for diabetic education on how to use the sliding scale and which will be the most affordable rapid acting insulin.  03/22/20 Patient was supposed to recheck his Coumadin today however he is actually been off his Coumadin for the last 4 days because they are placing a  feeding tube tomorrow.  Therefore there is no reason to check his Coumadin today.  Instead we will check his Coumadin in 2 weeks after he is resumed at.  He has been taking the clonidine twice a day.  His blood pressure today is 150/80.  I checked this personally.  He states that it was low at home and low the other day at his doctor's appointment.  Therefore his blood pressure certainly out of the dangerous range.  He has not been checking his sugars yet due to confusion about how to use the meter.  He is still taking his Levemir as prescribed but he has not been using the rapid acting insulin sliding scale.  At that time, my plan was: Blood pressure is better but not at goal.  However it sounds like his blood pressure may be lower at home.  Therefore of asked  the patient to check his blood pressure every day and notify me of the results later this week.  We can increase the clonidine to 0.2 twice a day if necessary.  Patient had diabetic education today with my nurse until he felt comfortable using his meter.  He will take the Levemir twice a day as prescribed and he will start using sliding scale insulin correction with rapid insulin based on his blood sugars before meals and bedtime.  Recheck INR in 2 weeks.  04/07/20 Patient is here today to recheck his Coumadin.  INR is 3.4.  He is taking 10 mg of Coumadin a day.  Recently had a hypoglycemic episode where his blood sugar was in the 40s.  Unfortunately has not been checking his blood sugar.  He states that he is unable to prick his fingers and get blood from his fingers for some reason.  He states that he is fed up with using the fingerstick blood sugars and is not going to do it anymore.  I explained to the patient that it is impossible to manage his sugars if he is not checking his blood sugar and knowing when to hold his insulin if too low.  Furthermore, I am unable to titrate his insulin if I do not know where his sugars are running.  Patient is interested in the freestyle libre to.  I am uncertain if his insurance will cover it however we can potentially petition on his behalf due to the fact that he has chemotherapy-induced hyperglycemia secondary to Decadron that causes his blood sugars to fluctuate wildly and the fact that he is on insulin and checks his sugars more than 4 times a day if done appropriately.  His blood pressures today are better at 140/88.  The adenopathy in his neck is noticeably decreased.  Is quite impressive how much is improved on chemotherapy and with radiation therapy Past Surgical History:  Procedure Laterality Date  . DIRECT LARYNGOSCOPY N/A 02/10/2020   Procedure: DIRECT LARYNGOSCOPY WITH BIOPSY;  Surgeon: [name], MD;  Location: [facility];  Service: ENT;  Laterality: N/A;  . Ear  surgery as a child    . IR GASTROSTOMY TUBE MOD SED  03/23/2020  . IR IMAGING GUIDED PORT INSERTION  03/09/2020  . TONSILLECTOMY     Current Outpatient Medications on File Prior to Visit  Medication Sig Dispense Refill  . amLODipine (NORVASC) 10 MG tablet Take 1 tablet (10 mg total) by mouth daily. 90 tablet 1  . atorvastatin (LIPITOR) 20 MG tablet TAKE 1 TABLET (20 MG TOTAL) DAILY BY MOUTH. 90 tablet 3  .  Blood Glucose Monitoring Suppl (ONETOUCH VERIO IQ SYSTEM) w/Device KIT 1 each by Does not apply route 2 (two) times daily. 1 kit 0  . carvedilol (COREG) 25 MG tablet TAKE 1 TABLET BY MOUTH TWICE A DAY WITH A MEAL (Patient taking differently: Take 25 mg by mouth 2 (two) times daily with a meal.) 180 tablet 3  . chlorthalidone (HYGROTON) 25 MG tablet TAKE 1 TABLET BY MOUTH EVERY DAY 90 tablet 1  . cloNIDine (CATAPRES) 0.1 MG tablet Take 1 tablet (0.1 mg total) by mouth 2 (two) times daily. 60 tablet 3  . Continuous Blood Gluc Receiver (FREESTYLE LIBRE 2 READER) DEVI Use as directed to monitor blood glucose continuously DX E11.65 1 each 1  . Continuous Blood Gluc Sensor (FREESTYLE LIBRE 2 SENSOR) MISC Use as directed to monitor blood glucose continuously DX E11.65 2 each 2  . dexamethasone (DECADRON) 4 MG tablet Take 2 tablets (8 mg total) by mouth daily. Take daily x 3 days starting the day after cisplatin chemotherapy. Take with food. 30 tablet 1  . furosemide (LASIX) 20 MG tablet TAKE 1 TABLET (20 MG TOTAL) BY MOUTH DAILY AS NEEDED FOR FLUID. 90 tablet 1  . glucose blood test strip 1 each by Other route as needed for other. Use as instructed 100 each 11  . hydrALAZINE (APRESOLINE) 50 MG tablet TAKE 1 TABLET BY MOUTH THREE TIMES A DAY (Patient taking differently: Take 50 mg by mouth 3 (three) times daily.) 270 tablet 1  . Insulin Pen Needle (B-D UF III MINI PEN NEEDLES) 31G X 5 MM MISC USE DAILY WITH INSULIN 100 each 5  . Insulin Syringe-Needle U-100 (INSULIN SYRINGE .3CC/31GX5/16") 31G X 5/16" 0.3  ML MISC Use twice daily to inject insulin. 100 each 10  . LEVEMIR FLEXTOUCH 100 UNIT/ML FlexPen INJECT 70 UNITS UNDER THE SKIN EVERY MORNING AND 40 UNITS AT BEDTIME 15 mL 3  . lidocaine (XYLOCAINE) 2 % solution Patient: Mix 1part 2% viscous lidocaine, 1part H20. Swish & swallow 59mL of diluted mixture, 60min before meals and at bedtime, up to QID 200 mL 4  . lidocaine-prilocaine (EMLA) cream Apply to affected area once 30 g 3  . LORazepam (ATIVAN) 0.5 MG tablet Take 1 tablet (0.5 mg total) by mouth every 6 (six) hours as needed (Nausea or vomiting). 30 tablet 0  . losartan (COZAAR) 50 MG tablet Take 1 tablet (50 mg total) by mouth daily. 90 tablet 3  . ondansetron (ZOFRAN) 8 MG tablet Take 1 tablet (8 mg total) by mouth 2 (two) times daily as needed. Start on the third day after cisplatin chemotherapy. 30 tablet 1  . OneTouch Delica Lancets 32D MISC 1 each by Does not apply route 2 (two) times daily. 200 each 11  . pioglitazone (ACTOS) 15 MG tablet Take 1 tablet (15 mg total) by mouth daily. 90 tablet 2  . prochlorperazine (COMPAZINE) 10 MG tablet Take 1 tablet (10 mg total) by mouth every 6 (six) hours as needed (Nausea or vomiting). 30 tablet 1  . warfarin (COUMADIN) 10 MG tablet Take 1 tablet (10 mg total) by mouth daily. 90 tablet 3  . zolpidem (AMBIEN) 5 MG tablet Take 1 tablet (5 mg total) by mouth at bedtime as needed for sleep. 30 tablet 0   No current facility-administered medications on file prior to visit.   Allergies  Allergen Reactions  . Codeine Other (See Comments)    Unknown reaction, severe headach   Social History   Socioeconomic History  .  Marital status: Divorced    Spouse name: Not on file  . Number of children: 2  . Years of education: Not on file  . Highest education level: Not on file  Occupational History  . Occupation: Disabled  Tobacco Use  . Smoking status: Never Smoker  . Smokeless tobacco: Never Used  Vaping Use  . Vaping Use: Never used  Substance and  Sexual Activity  . Alcohol use: Yes    Alcohol/week: 0.0 standard drinks    Comment: seldom  . Drug use: Yes    Frequency: 7.0 times per week    Comment: marijauna for arthritis, last 03-13-20  . Sexual activity: Not Currently  Other Topics Concern  . Not on file  Social History Narrative   He has children.   Social Determinants of Health   Financial Resource Strain: Low Risk   . Difficulty of Paying Living Expenses: Not very hard  Food Insecurity: No Food Insecurity  . Worried About Charity fundraiser in the Last Year: Never true  . Ran Out of Food in the Last Year: Never true  Transportation Needs: No Transportation Needs  . [name] of Transportation (Medical): No  . [name] of Transportation (Non-Medical): No  Physical Activity: Not on file  Stress: Stress Concern Present  . Feeling of Stress : To some extent  Social Connections: Socially Isolated  . Frequency of Communication with Friends and Family: More than three times a week  . Frequency of Social Gatherings with Friends and Family: More than three times a week  . Attends Religious Services: Never  . Active Member of Clubs or Organizations: No  . Attends Archivist Meetings: Never  . Marital Status: Divorced  Human resources officer Violence: Not on file    Review of Systems  All other systems reviewed and are negative.      Objective:   Physical Exam Vitals reviewed.  HENT:     Head:     Salivary Glands: Left salivary gland is diffusely enlarged and tender.   Cardiovascular:     Rate and Rhythm: Normal rate and regular rhythm.     Heart sounds: Normal heart sounds.  Pulmonary:     Effort: Pulmonary effort is normal. No respiratory distress.     Breath sounds: Normal breath sounds. No wheezing or rales.  Chest:     Chest wall: No tenderness.  Abdominal:     General: Bowel sounds are normal. There is no distension.     Palpations: Abdomen is soft.     Tenderness: There is no abdominal tenderness. There  is no rebound.  Musculoskeletal:     Right lower leg: No edema.     Left lower leg: No edema.  Lymphadenopathy:     Cervical: Cervical adenopathy present.     Right cervical: Superficial cervical adenopathy present.     Left cervical: Superficial cervical adenopathy present.           Assessment & Plan:  Type 2 diabetes mellitus with obesity (HCC)  Atrial fibrillation, unspecified type ([facility]) - Plan: PT with INR/Fingerstick  Essential hypertension, benign  Blood pressure is now adequately controlled.  Recommended the patient switch to the Freestyle libre to system and start checking his blood sugar before every meal and bedtime and when symptomatic so that we can assess whether to decrease his basal insulin and also so that he can implement the sliding scale as directed.  It is impossible to control his sugars with the Decadron and the  insulin without him being able to check his blood sugar so I will petition his insurance on his behalf if my prescription is declined at the pharmacy.  INR is supratherapeutic.  Decrease Coumadin to 5 mg on Monday and Friday and 10 mg on all other days and recheck INR in 4 weeks

## 2020-04-07 NOTE — Patient Instructions (Signed)
Rehydration, Adult Rehydration is the replacement of body fluids, salts, and minerals (electrolytes) that are lost during dehydration. Dehydration is when there is not enough water or other fluids in the body. This happens when you lose more fluids than you take in. Common causes of dehydration include:  Not drinking enough fluids. This can occur when you are ill or doing activities that require a lot of energy, especially in hot weather.  Conditions that cause loss of water or other fluids, such as diarrhea, vomiting, sweating, or urinating a lot.  Other illnesses, such as fever or infection.  Certain medicines, such as those that remove excess fluid from the body (diuretics). Symptoms of mild or moderate dehydration may include thirst, dry lips and mouth, and dizziness. Symptoms of severe dehydration may include increased heart rate, confusion, fainting, and not urinating. For severe dehydration, you may need to get fluids through an IV at the hospital. For mild or moderate dehydration, you can usually rehydrate at home by drinking certain fluids as told by your health care provider. What are the risks? Generally, rehydration is safe. However, taking in too much fluid (overhydration) can be a problem. This is rare. Overhydration can cause an electrolyte imbalance, kidney failure, or a decrease in salt (sodium) levels in the body. Supplies needed You will need an oral rehydration solution (ORS) if your health care provider tells you to use one. This is a drink to treat dehydration. It can be found in pharmacies and retail stores. How to rehydrate Fluids Follow instructions from your health care provider for rehydration. The kind of fluid and the amount you should drink depend on your condition. In general, you should choose drinks that you prefer.  If told by your health care provider, drink an ORS. ? Make an ORS by following instructions on the package. ? Start by drinking small amounts,  about  cup (120 mL) every 5-10 minutes. ? Slowly increase how much you drink until you have taken the amount recommended by your health care provider.  Drink enough clear fluids to keep your urine pale yellow. If you were told to drink an ORS, finish it first, then start slowly drinking other clear fluids. Drink fluids such as: ? Water. This includes sparkling water and flavored water. Drinking only water can lead to having too little sodium in your body (hyponatremia). Follow the advice of your health care provider. ? Water from ice chips you suck on. ? Fruit juice with water you add to it (diluted). ? Sports drinks. ? Hot or cold herbal teas. ? Broth-based soups. ? Milk or milk products. Food Follow instructions from your health care provider about what to eat while you rehydrate. Your health care provider may recommend that you slowly begin eating regular foods in small amounts.  Eat foods that contain a healthy balance of electrolytes, such as bananas, oranges, potatoes, tomatoes, and spinach.  Avoid foods that are greasy or contain a lot of sugar. In some cases, you may get nutrition through a feeding tube that is passed through your nose and into your stomach (nasogastric tube, or NG tube). This may be done if you have uncontrolled vomiting or diarrhea.   Beverages to avoid Certain beverages may make dehydration worse. While you rehydrate, avoid drinking alcohol.   How to tell if you are recovering from dehydration You may be recovering from dehydration if:  You are urinating more often than before you started rehydrating.  Your urine is pale yellow.  Your energy level  improves.  You vomit less frequently.  You have diarrhea less frequently.  Your appetite improves or returns to normal.  You feel less dizzy or less light-headed.  Your skin tone and color start to look more normal. Follow these instructions at home:  Take over-the-counter and prescription medicines only  as told by your health care provider.  Do not take sodium tablets. Doing this can lead to having too much sodium in your body (hypernatremia). Contact a health care provider if:  You continue to have symptoms of mild or moderate dehydration, such as: ? Thirst. ? Dry lips. ? Slightly dry mouth. ? Dizziness. ? Dark urine or less urine than normal. ? Muscle cramps.  You continue to vomit or have diarrhea. Get help right away if you:  Have symptoms of dehydration that get worse.  Have a fever.  Have a severe headache.  Have been vomiting and the following happens: ? Your vomiting gets worse or does not go away. ? Your vomit includes blood or green matter (bile). ? You cannot eat or drink without vomiting.  Have problems with urination or bowel movements, such as: ? Diarrhea that gets worse or does not go away. ? Blood in your stool (feces). This may cause stool to look black and tarry. ? Not urinating, or urinating only a small amount of very dark urine, within 6-8 hours.  Have trouble breathing.  Have symptoms that get worse with treatment. These symptoms may represent a serious problem that is an emergency. Do not wait to see if the symptoms will go away. Get medical help right away. Call your local emergency services (911 in the [place] Do not drive yourself to the hospital. Summary  Rehydration is the replacement of body fluids and minerals (electrolytes) that are lost during dehydration.  Follow instructions from your health care provider for rehydration. The kind of fluid and amount you should drink depend on your condition.  Slowly increase how much you drink until you have taken the amount recommended by your health care provider.  Contact your health care provider if you continue to show signs of mild or moderate dehydration. This information is not intended to replace advice given to you by your health care provider. Make sure you discuss any questions you have with  your health care provider. Document Revised: 04/08/2019 Document Reviewed: 02/16/2019 Elsevier Patient Education  2021 [place].   Hypomagnesemia Hypomagnesemia is a condition in which the level of magnesium in the blood is low. Magnesium is a mineral that is found in many foods. It is used in many different processes in the body. Hypomagnesemia can affect every organ in the body. In severe cases, it can cause life-threatening problems. What are the causes? This condition may be caused by:  Not getting enough magnesium in your diet.  Malnutrition.  Problems with absorbing magnesium from the intestines.  Dehydration.  Alcohol abuse.  Vomiting.  Severe or chronic diarrhea.  Some medicines, including medicines that make you urinate more (diuretics).  Certain diseases, such as kidney disease, diabetes, celiac disease, and overactive thyroid. What are the signs or symptoms? Symptoms of this condition include:  Loss of appetite.  Nausea and vomiting.  Involuntary shaking or trembling of a body part (tremor).  Muscle weakness.  Tingling in the arms and legs.  Sudden tightening of muscles (muscle spasms).  Confusion.  Psychiatric issues, such as depression, irritability, or psychosis.  A feeling of fluttering of the heart.  Seizures. These symptoms are more severe  if magnesium levels drop suddenly. How is this diagnosed? This condition may be diagnosed based on:  Your symptoms and medical history.  A physical exam.  Blood and urine tests. How is this treated? Treatment depends on the cause and the severity of the condition. It may be treated with:  A magnesium supplement. This can be taken in pill form. If the condition is severe, magnesium is usually given through an IV.  Changes to your diet. You may be directed to eat foods that have a lot of magnesium, such as green leafy vegetables, peas, beans, and nuts.  Stopping any intake of alcohol.   Follow  these instructions at home:  Make sure that your diet includes foods with magnesium. Foods that have a lot of magnesium in them include: ? Green leafy vegetables, such as spinach and broccoli. ? Beans and peas. ? Nuts and seeds, such as almonds and sunflower seeds. ? Whole grains, such as whole grain bread and fortified cereals.  Take magnesium supplements if your health care provider tells you to do that. Take them as directed.  Take over-the-counter and prescription medicines only as told by your health care provider.  Have your magnesium levels monitored as told by your health care provider.  When you are active, drink fluids that contain electrolytes.  Avoid drinking alcohol.  Keep all follow-up visits as told by your health care provider. This is important.      Contact a health care provider if:  You get worse instead of better.  Your symptoms return. Get help right away if you:  Develop severe muscle weakness.  Have trouble breathing.  Feel that your heart is racing. Summary  Hypomagnesemia is a condition in which the level of magnesium in the blood is low.  Hypomagnesemia can affect every organ in the body.  Treatment may include eating more foods that contain magnesium, taking magnesium supplements, and not drinking alcohol.  Have your magnesium levels monitored as told by your health care provider. This information is not intended to replace advice given to you by your health care provider. Make sure you discuss any questions you have with your health care provider. Document Revised: 07/09/2019 Document Reviewed: 07/09/2019 Elsevier Patient Education  [place].

## 2020-04-07 NOTE — Progress Notes (Signed)
[place] NOTE  Patient Care Team: [name], MD as PCP - General (Family Medicine) [name], [name], RN as Oncology Nurse Navigator [name], MD as Consulting Physician (Radiation Oncology) [name], MD as Consulting Physician (Hematology and Oncology) [name], RD as Dietitian (Nutrition) [name], [name], [name] as Speech Language Pathologist (Speech Pathology) [name], [name], PT as Physical Therapist (Physical Therapy) Justice Britain as Social Worker (General Practice)  CHIEF COMPLAINTS/PURPOSE OF CONSULTATION:  SCC of oropharynx.  ASSESSMENT & PLAN:  No problem-specific Assessment & Plan notes found for this encounter.  Orders Placed This Encounter  Procedures  . CBC with Differential/Platelet    Standing Status:   Standing    Number of Occurrences:   22    Standing Expiration Date:   04/07/2021  . CMP (Cancer Center only)    Standing Status:   Standing    Number of Occurrences:   20    Standing Expiration Date:   04/07/2021   This is a 65 year old male patient with past medical history significant for diabetes, hypertension, coronary artery disease, stroke, atrial fibrillation on anticoagulation, obesity referred to medical oncology for evaluation and recommendations regarding new diagnosis of squamous cell carcinoma of oropharynx.  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 I have reviewed the current recommendations for stage III HPV positive oropharyngeal cancer with the patient today which is primarily concurrent chemoradiation. Given his comorbidities, age, I think he is a good candidate for weekly cisplatin along with radiation.  We have discussed about adverse side effects of cisplatin including but not limited to fatigue, nausea, vomiting, cytopenias, increased risk of infections, permanent hearing loss, nephrotoxicity  etc. Baseline audiology referral done by Dr Benjamine Mola, no severe hearing loss noted. Given his creatinine and GFR, we started off with 30 mg/m2 weekly cisplatin. Creatinine and GFR continue to be sub optimal, so we will change chemotherapy to weekly carboplatin. Discussed adverse effects of carboplatin with the patient including but not limited to fatigue, nausea, vomiting, diarrhea, increased risk of infections and very rarely fatal events. He did very well with his first cycle of carboplatin, 04/04/2020, Clinically responding with decreased lymphadenopathy Ok to proceed with carboplatin on Monday if labs are within parameters. Standing orders in chart.  2. Insomnia, he is using 10 mg QHS of ambien. He understands that this medication can cause day time drowsiness and will increase risk of falls. Ok to continue during chemoradiation Likely situational. Improving  3. AKI, likely induced due to cisplatin Will also refer him to nephrology for any additional recommendations. IVF hopefully today  4. Drug induced hypomagnesemia. Likely from renal wasting of magnesium. Mg replacement today  FU scheduled.  [name] MD   HISTORY OF PRESENTING ILLNESS:   Brian [name] is a 65 y.o. male who presented two-week history of facial swelling in late November/early December 2021. Subsequently, the patient saw Dr. Benjamine Mola, who performed a direct laryngoscopy with biopsy on the date of 02/10/2020. Final pathology revealed: squamous cell carcinoma, P16 positive  He had the following imaging performed  1. Soft tissue neck CT scan performed on 01/28/2020 revealing right glossotonsillar malignancy with bulky bilateral nodal disease. Nodal masses sowed definite extracapsular spread. There was also noted to be incidental advanced cervical spine degeneration with cord compression.  2. PET scan performed on 02/26/2020 revealing a hypermetabolic lesion at the right base of the tongue and right tonsil,  consistent with oropharyngeal carcinoma. Carcinoma appeared to be confined to the mucosal surface. There were also noted to bulky bilateral hypermetabolic level II metastatic lymph nodes and smaller inferior hypermetabolic level II metastatic lymph nodes. There was no evidence of metastatic disease in the chest, abdomen, or pelvis. The two small right middle lobe pulmonary nodules present on CT from 2012 were consistent with benign etiology.  Started radiation on 03/14/2020 Chemo delayed by a week due to GFR and infusion issues. Started chemotherapy on 03/21/2020 C2 delayed because of AKI Now switched to weekly carboplatin given ongoing AKI.  Interim History  He is doing well. He denies any nausea, vomiting, fatigue He has good appetite and eating very well No change in breathing No change in bowel habits or urinary habits No new neurological complaints. Hearing well. No neuropathy No pain, maintaining weight well  REVIEW OF SYSTEMS:   Constitutional: Denies fevers, chills or abnormal night sweats Eyes: Denies blurriness of vision, double vision or watery eyes Ears, nose, mouth, throat, and face: Denies mucositis or sore throat.  He is able to swallow everything Respiratory: Denies cough, dyspnea or wheezes Cardiovascular: Denies palpitation, chest discomfort or lower extremity swelling Gastrointestinal:  Denies nausea, heartburn or change in bowel habits Skin: Denies abnormal skin rashes Lymphatics: Complains of masses in the neck  neurological:Denies numbness, tingling or new weaknesses Behavioral/Psych: Mood is stable, no new changes  All other systems were reviewed with the patient and are negative.  MEDICAL HISTORY:  Past Medical History:  Diagnosis Date  . Arthritis   . Atrial fibrillation ([place])   . Cardiomyopathy   . Cerebrovascular accident ([place])    2012  . Diabetes mellitus   . Hyperlipidemia   . Hypertension   . Left-sided weakness   . 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 Zigmund Daniel, laser therapy OD  . Seizures ([place])    pt reports this is from low blood sugar    SURGICAL HISTORY: Past Surgical History:  Procedure Laterality Date  . DIRECT LARYNGOSCOPY N/A 02/10/2020   Procedure: DIRECT LARYNGOSCOPY WITH BIOPSY;  Surgeon: [name], MD;  Location: [facility];  Service: ENT;  Laterality: N/A;  . Ear surgery as a child    . IR GASTROSTOMY TUBE MOD SED  03/23/2020  . IR IMAGING GUIDED PORT INSERTION  03/09/2020  . TONSILLECTOMY      SOCIAL HISTORY: Social History   Socioeconomic History  . Marital status: Divorced    Spouse name: Not on file  . Number of children: 2  . Years of education: Not on file  . Highest education level: Not on file  Occupational History  . Occupation: Disabled  Tobacco Use  . Smoking status: Never Smoker  . Smokeless tobacco: Never Used  Vaping Use  . Vaping Use: Never used  Substance and Sexual Activity  . Alcohol use: Yes    Alcohol/week: 0.0 standard drinks    Comment: seldom  . Drug use: Yes    Frequency: 7.0 times per week    Comment: marijauna for arthritis, last 03-13-20  . Sexual activity: Not Currently  Other Topics Concern  . Not on file  Social History Narrative   He has children.   Social Determinants of Health   Financial Resource Strain: Low Risk   . Difficulty of Paying Living Expenses: Not very hard  Food Insecurity: No Food Insecurity  . Worried About Charity fundraiser in the Last Year: Never true  . Ran Out of  Food in the Last Year: Never true  Transportation Needs: No Transportation Needs  . Lack of Transportation (Medical): No  . Lack of Transportation (Non-Medical): No  Physical Activity: Not on file  Stress: Stress Concern Present  . Feeling of Stress : To some extent  Social Connections: Socially Isolated  . Frequency of Communication with Friends and Family: More than three times a week  . Frequency of Social Gatherings with Friends and Family: More than three times a  week  . Attends Religious Services: Never  . Active Member of Clubs or Organizations: No  . Attends Archivist Meetings: Never  . Marital Status: Divorced  Human resources officer Violence: Not on file    FAMILY HISTORY: Family History  Problem Relation Age of Onset  . Hyperlipidemia Father   . Hypertension Father   . Coronary artery disease Neg Hx        no early CAD.  Marland Kitchen Colon cancer Neg Hx   . Stomach cancer Neg Hx   . Rectal cancer Neg Hx   . Esophageal cancer Neg Hx     ALLERGIES:  is allergic to codeine.  MEDICATIONS:  Current Outpatient Medications  Medication Sig Dispense Refill  . amLODipine (NORVASC) 10 MG tablet Take 1 tablet (10 mg total) by mouth daily. 90 tablet 1  . atorvastatin (LIPITOR) 20 MG tablet TAKE 1 TABLET (20 MG TOTAL) DAILY BY MOUTH. 90 tablet 3  . Blood Glucose Monitoring Suppl (ONETOUCH VERIO IQ SYSTEM) w/Device KIT 1 each by Does not apply route 2 (two) times daily. 1 kit 0  . carvedilol (COREG) 25 MG tablet TAKE 1 TABLET BY MOUTH TWICE A DAY WITH A MEAL (Patient taking differently: Take 25 mg by mouth 2 (two) times daily with a meal.) 180 tablet 3  . chlorthalidone (HYGROTON) 25 MG tablet TAKE 1 TABLET BY MOUTH EVERY DAY 90 tablet 1  . cloNIDine (CATAPRES) 0.1 MG tablet Take 1 tablet (0.1 mg total) by mouth 2 (two) times daily. 60 tablet 3  . Continuous Blood Gluc Receiver (FREESTYLE LIBRE 2 READER) DEVI Use as directed to monitor blood glucose continuously DX E11.65 1 each 1  . Continuous Blood Gluc Sensor (FREESTYLE LIBRE 2 SENSOR) MISC Use as directed to monitor blood glucose continuously DX E11.65 2 each 2  . dexamethasone (DECADRON) 4 MG tablet Take 2 tablets (8 mg total) by mouth daily. Take daily x 3 days starting the day after cisplatin chemotherapy. Take with food. 30 tablet 1  . furosemide (LASIX) 20 MG tablet TAKE 1 TABLET (20 MG TOTAL) BY MOUTH DAILY AS NEEDED FOR FLUID. 90 tablet 1  . glucose blood test strip 1 each by Other route as  needed for other. Use as instructed 100 each 11  . hydrALAZINE (APRESOLINE) 50 MG tablet TAKE 1 TABLET BY MOUTH THREE TIMES A DAY (Patient taking differently: Take 50 mg by mouth 3 (three) times daily.) 270 tablet 1  . Insulin Pen Needle (B-D UF III MINI PEN NEEDLES) 31G X 5 MM MISC USE DAILY WITH INSULIN 100 each 5  . Insulin Syringe-Needle U-100 (INSULIN SYRINGE .3CC/31GX5/16") 31G X 5/16" 0.3 ML MISC Use twice daily to inject insulin. 100 each 10  . LEVEMIR FLEXTOUCH 100 UNIT/ML FlexPen INJECT 70 UNITS UNDER THE SKIN EVERY MORNING AND 40 UNITS AT BEDTIME 15 mL 3  . lidocaine (XYLOCAINE) 2 % solution Patient: Mix 1part 2% viscous lidocaine, 1part H20. Swish & swallow 32m of diluted mixture, 369m before meals and at bedtime,  up to QID 200 mL 4  . lidocaine-prilocaine (EMLA) cream Apply to affected area once 30 g 3  . LORazepam (ATIVAN) 0.5 MG tablet Take 1 tablet (0.5 mg total) by mouth every 6 (six) hours as needed (Nausea or vomiting). 30 tablet 0  . losartan (COZAAR) 50 MG tablet Take 1 tablet (50 mg total) by mouth daily. 90 tablet 3  . ondansetron (ZOFRAN) 8 MG tablet Take 1 tablet (8 mg total) by mouth 2 (two) times daily as needed. Start on the third day after cisplatin chemotherapy. 30 tablet 1  . OneTouch Delica Lancets 51W MISC 1 each by Does not apply route 2 (two) times daily. 200 each 11  . pioglitazone (ACTOS) 15 MG tablet Take 1 tablet (15 mg total) by mouth daily. 90 tablet 2  . prochlorperazine (COMPAZINE) 10 MG tablet Take 1 tablet (10 mg total) by mouth every 6 (six) hours as needed (Nausea or vomiting). 30 tablet 1  . warfarin (COUMADIN) 10 MG tablet Take 1 tablet (10 mg total) by mouth daily. 90 tablet 3  . zolpidem (AMBIEN) 5 MG tablet Take 1 tablet (5 mg total) by mouth at bedtime as needed for sleep. 30 tablet 0   No current facility-administered medications for this visit.   Facility-Administered Medications Ordered in Other Visits  Medication Dose Route Frequency  Provider Last Rate Last Admin  . heparin lock flush 100 unit/mL  500 Units Intravenous Once Christyne Mccain, MD      . sodium chloride flush (NS) 0.9 % injection 10 mL  10 mL Intravenous Once Evella Kasal, MD         PHYSICAL EXAMINATION: ECOG PERFORMANCE STATUS: 0 - Asymptomatic  Vitals:   04/07/20 0857  BP: (!) 141/70  Pulse: 74  Resp: 19  Temp: 99 F (37.2 C)  SpO2: 99%   Filed Weights   04/07/20 0857  Weight: 204 lb 11.2 oz (92.9 kg)   Physical Exam Constitutional:      Appearance: Normal appearance.  HENT:     Head: Normocephalic and atraumatic.     Nose: Nose normal.     Mouth/Throat:     Mouth: Mucous membranes are moist.  Cardiovascular:     Rate and Rhythm: Normal rate and regular rhythm.     Pulses: Normal pulses.     Heart sounds: Normal heart sounds.  Pulmonary:     Effort: Pulmonary effort is normal.     Breath sounds: Normal breath sounds.  Abdominal:     General: Abdomen is flat.     Palpations: Abdomen is soft.  Musculoskeletal:        General: No swelling. Normal range of motion.     Cervical back: Normal range of motion and neck supple.  Lymphadenopathy:     Cervical: Cervical adenopathy (Decreasing in size.) present.  Skin:    General: Skin is warm and dry.  Neurological:     General: No focal deficit present.     Mental Status: He is alert and oriented to person, place, and time.  Psychiatric:        Mood and Affect: Mood normal.        Behavior: Behavior normal.      LABORATORY DATA:  I have reviewed the data as listed Lab Results  Component Value Date   WBC 4.2 04/07/2020   HGB 10.1 (L) 04/07/2020   HCT 29.7 (L) 04/07/2020   MCV 81.6 04/07/2020   PLT 127 (L) 04/07/2020     Chemistry  Component Value Date/Time   NA 128 (L) 04/07/2020 0843   K 4.4 04/07/2020 0843   CL 97 (L) 04/07/2020 0843   CO2 25 04/07/2020 0843   BUN 50 (H) 04/07/2020 0843   CREATININE 2.13 (H) 04/07/2020 0843   CREATININE 2.21 (H) 04/06/2020  0811   CREATININE 1.23 11/30/2019 0915      Component Value Date/Time   CALCIUM 8.5 (L) 04/07/2020 0843   ALKPHOS 58 04/04/2020 0814   AST 15 04/04/2020 0814   ALT 16 04/04/2020 0814   BILITOT 0.4 04/04/2020 0814       RADIOGRAPHIC STUDIES: I have personally reviewed the radiological images as listed and agreed with the findings in the report. IR Gastrostomy Tube  Result Date: 03/23/2020 INDICATION: 65 year old male with oropharyngeal cancer requiring percutaneous enteric access. EXAM: PERC PLACEMENT GASTROSTOMY MEDICATIONS: Ancef 2 gm IV; Antibiotics were administered within 1 hour of the procedure. ANESTHESIA/SEDATION: Versed 2 mg IV; Fentanyl 50 mcg IV Moderate Sedation Time:  15 The patient was continuously monitored during the procedure by the interventional radiology nurse under my direct supervision. CONTRAST:  107m OMNIPAQUE IOHEXOL 300 MG/ML SOLN - administered into the gastric lumen. FLUOROSCOPY TIME:  Fluoroscopy Time: 1 minutes 18 seconds (31 mGy). COMPLICATIONS: None immediate. PROCEDURE: Informed written consent was obtained from the patient after a thorough discussion of the procedural risks, benefits and alternatives. All questions were addressed. Maximal Sterile Barrier Technique was utilized including caps, mask, sterile gowns, sterile gloves, sterile drape, hand hygiene and skin antiseptic. A timeout was performed prior to the initiation of the procedure. The patient was placed on the procedure table in the supine position. Pre-procedure abdominal film confirmed visualization of the transverse colon. An angled 5-French catheter was passed through the nares into the stomach. The patient was prepped and draped in usual sterile fashion. The stomach was insufflated with air via the indwelling nasogastric tube. Under fluoroscopy, a puncture site was selected and local analgesia achieved with 1% lidocaine infiltrated subcutaneously. Under fluoroscopic guidance, a gastropexy needle was  passed into the stomach and the T-bar suture was released. Entry into the stomach was confirmed with fluoroscopy, aspiration of air, and injection of contrast material. This was repeated with an additional gastropexy suture (for a total of 2 fasteners). At the center of these gastropexy sutures, a dermatotomy was performed. An 18 gauge needle was passed into the stomach at the site of this dermatotomy, and position within the gastric lumen again confirmed under fluoroscopy using aspiration of air and contrast injection. An Amplatz guidewire was passed through this needle and intraluminal placement within the stomach was confirmed by fluoroscopy. The needle was removed. Over the guidewire, the percutaneous tract was dilated using a 10 mm non-compliant balloon. The balloon was deflated, then pushed into the gastric lumen followed in concert by the 20 Fr gastrostomy tube. The retention balloon of the percutaneous gastrostomy tube was inflated with 20 mL of sterile water. The tube was withdrawn until the retention balloon was at the edge of the gastric lumen. The external bumper was brought to the abdominal wall. Contrast was injected through the gastrostomy tube, confirming intraluminal positioning. The patient tolerated the procedure well without any immediate post-procedural complications. IMPRESSION: Technically successful placement of 20 Fr gastrostomy tube. DRuthann Cancer MD [facility] Electronically Signed   By: DRuthann CancerMD   On: 03/23/2020 13:19   IR IMAGING GUIDED PORT INSERTION  Result Date: 03/09/2020 INDICATION: 65year old male referred for port  catheter EXAM: IMAGE GUIDED PORT CATHETER MEDICATIONS: 2 g Ancef; The antibiotic was administered within an appropriate time interval prior to skin puncture. ANESTHESIA/SEDATION: Moderate (conscious) sedation was employed during this procedure. A total of Versed 3.0 mg and Fentanyl 100 mcg was  administered intravenously. Moderate Sedation Time: 20 minutes. The patient's level of consciousness and vital signs were monitored continuously by radiology nursing throughout the procedure under my direct supervision. FLUOROSCOPY TIME:  Fluoroscopy Time: 0 minutes 6 seconds (2 mGy). COMPLICATIONS: None PROCEDURE: The procedure, risks, benefits, and alternatives were explained to the patient. Questions regarding the procedure were encouraged and answered. The patient understands and consents to the procedure. Ultrasound survey was performed with images stored and sent to PACs. The right neck and chest was prepped with chlorhexidine, and draped in the usual sterile fashion using maximum barrier technique (cap and mask, sterile gown, sterile gloves, large sterile sheet, hand hygiene and cutaneous antiseptic). Antibiotic prophylaxis was provided with 2.0g Ancef administered IV one hour prior to skin incision. Local anesthesia was attained by infiltration with 1% lidocaine without epinephrine. Ultrasound demonstrated patency of the right internal jugular vein, and this was documented with an image. Under real-time ultrasound guidance, this vein was accessed with a 21 gauge micropuncture needle and image documentation was performed. A small dermatotomy was made at the access site with an 11 scalpel. A 0.018" wire was advanced into the SVC and used to estimate the length of the internal catheter. The access needle exchanged for a 64F micropuncture vascular sheath. The 0.018" wire was then removed and a 0.035" wire advanced into the IVC. An appropriate location for the subcutaneous reservoir was selected below the clavicle and an incision was made through the skin and underlying soft tissues. The subcutaneous tissues were then dissected using a combination of blunt and sharp surgical technique and a pocket was formed. A single lumen power injectable portacatheter was then tunneled through the subcutaneous tissues from the  pocket to the dermatotomy and the port reservoir placed within the subcutaneous pocket. The venous access site was then serially dilated and a peel away vascular sheath placed over the wire. The wire was removed and the port catheter advanced into position under fluoroscopic guidance. The catheter tip is positioned in the cavoatrial junction. This was documented with a spot image. The portacatheter was then tested and found to flush and aspirate well. The port was flushed with saline followed by 100 units/mL heparinized saline. The pocket was then closed in two layers using first subdermal inverted interrupted absorbable sutures followed by a running subcuticular suture. The epidermis was then sealed with Dermabond. The dermatotomy at the venous access site was also seal with Dermabond. Patient tolerated the procedure well and remained hemodynamically stable throughout. No complications encountered and no significant blood loss encountered IMPRESSION: Status post right IJ port catheter placement. Catheter ready for use. Signed, Dulcy Fanny. Dellia Nims, RPVI [facility] Electronically Signed   By: Corrie Mckusick D.O.   On: 03/09/2020 12:45   I reviewed the image myself.  Noted hypermetabolic lesions at the base of tongue on the right side and right tonsil as well as the bilateral bulky lymphadenopathy.  02/10/2020  SURGICAL PATHOLOGY    Reason for Addendum #1: Immunohistochemistry results   Clinical History: throat cancer (cm)    FINAL MICROSCOPIC DIAGNOSIS:   A. OROPHARYNGEAL, RIGHT, BIOPSY:  - Squamous cell carcinoma  - See comment   COMMENT:   P16 immunohistochemistry is pending and  will be reported in an addendum.  Dr. Tresa Moore reviewed the case and agrees with the above diagnosis. Dr.  Benjamine Mola was notified of these results on February 11, 2020.   GROSS DESCRIPTION:   The specimen is received in saline and consists of a 1.6 x 1.5 x  0.3 cm  aggregate of tan-pink soft tissue. The specimen is entirely submitted  in 1 cassette. Craig Staggers 02/10/2020)    Final Diagnosis performed by Thressa Sheller, MD.  Electronically signed  02/11/2020  Technical and / or Professional components performed at [facility], [place] [address], [place], [place] [address].  Immunohistochemistry Technical component (if applicable) was performed  at [facility]. [address] [place],  [place],  [address].  IMMUNOHISTOCHEMISTRY DISCLAIMER (if applicable):  Some of these immunohistochemical stains may have been developed and the  performance characteristics determine by [facility]. Some  may not have been cleared or approved by the U.S. Food and Drug  Administration. The FDA has determined that such clearance or approval  is not necessary. This test is used for clinical purposes. It should not  be regarded as investigational or for research. This laboratory is  certified under the [place]  (CLIA-88) as qualified to perform high complexity clinical laboratory  testing. The controls stained appropriately.   ADDENDUM:   P16 immunohistochemistry is POSITIVE.   Reviewed labs. AKI and low Mg. Will hydrate him and provide Mag replacement.  All questions were answered. The patient knows to call the clinic with any problems, questions or concerns.[name], MD 04/07/2020 12:44 PM

## 2020-04-08 ENCOUNTER — Other Ambulatory Visit: Payer: Self-pay

## 2020-04-08 ENCOUNTER — Ambulatory Visit
Admission: RE | Admit: 2020-04-08 | Discharge: 2020-04-08 | Disposition: A | Discharge status: Home / Self Care | Payer: Medicare Other | Source: Ambulatory Visit | Attending: Radiation Oncology | Admitting: Radiation Oncology

## 2020-04-08 DIAGNOSIS — C01 Malignant neoplasm of base of tongue: Secondary | ICD-10-CM | POA: Diagnosis not present

## 2020-04-11 ENCOUNTER — Ambulatory Visit
Admission: RE | Admit: 2020-04-11 | Discharge: 2020-04-11 | Disposition: A | Discharge status: Home / Self Care | Payer: Medicare Other | Source: Ambulatory Visit | Attending: Radiation Oncology | Admitting: Radiation Oncology

## 2020-04-11 ENCOUNTER — Inpatient Hospital Stay: Payer: Medicare Other

## 2020-04-11 ENCOUNTER — Other Ambulatory Visit: Payer: Self-pay | Admitting: *Deleted

## 2020-04-11 ENCOUNTER — Other Ambulatory Visit: Payer: Medicare Other

## 2020-04-11 ENCOUNTER — Inpatient Hospital Stay: Payer: Medicare Other | Admitting: Nutrition

## 2020-04-11 ENCOUNTER — Other Ambulatory Visit: Payer: Self-pay

## 2020-04-11 ENCOUNTER — Ambulatory Visit: Payer: Medicare Other | Admitting: Medical

## 2020-04-11 ENCOUNTER — Ambulatory Visit: Payer: Medicare Other | Admitting: Hematology and Oncology

## 2020-04-11 VITALS — BP 132/77 | HR 73 | Temp 97.7°F | Resp 18 | Wt 206.0 lb

## 2020-04-11 DIAGNOSIS — G4700 Insomnia, unspecified: Secondary | ICD-10-CM | POA: Diagnosis not present

## 2020-04-11 DIAGNOSIS — C01 Malignant neoplasm of base of tongue: Secondary | ICD-10-CM | POA: Diagnosis not present

## 2020-04-11 DIAGNOSIS — E1169 Type 2 diabetes mellitus with other specified complication: Secondary | ICD-10-CM

## 2020-04-11 DIAGNOSIS — I10 Essential (primary) hypertension: Secondary | ICD-10-CM | POA: Diagnosis not present

## 2020-04-11 DIAGNOSIS — I2510 Atherosclerotic heart disease of native coronary artery without angina pectoris: Secondary | ICD-10-CM | POA: Diagnosis not present

## 2020-04-11 DIAGNOSIS — N179 Acute kidney failure, unspecified: Secondary | ICD-10-CM | POA: Diagnosis not present

## 2020-04-11 DIAGNOSIS — C109 Malignant neoplasm of oropharynx, unspecified: Secondary | ICD-10-CM | POA: Diagnosis not present

## 2020-04-11 DIAGNOSIS — E119 Type 2 diabetes mellitus without complications: Secondary | ICD-10-CM | POA: Diagnosis not present

## 2020-04-11 DIAGNOSIS — I4891 Unspecified atrial fibrillation: Secondary | ICD-10-CM | POA: Diagnosis not present

## 2020-04-11 DIAGNOSIS — Z23 Encounter for immunization: Secondary | ICD-10-CM | POA: Diagnosis not present

## 2020-04-11 DIAGNOSIS — E669 Obesity, unspecified: Secondary | ICD-10-CM

## 2020-04-11 DIAGNOSIS — Z5111 Encounter for antineoplastic chemotherapy: Secondary | ICD-10-CM | POA: Diagnosis not present

## 2020-04-11 MED ORDER — PALONOSETRON HCL INJECTION 0.25 MG/5ML
INTRAVENOUS | Status: AC
  Filled 2020-04-11: qty 5

## 2020-04-11 MED ORDER — FREESTYLE LIBRE 2 SENSOR MISC: 11 refills | Status: DC

## 2020-04-11 MED ORDER — FREESTYLE LIBRE 2 READER DEVI: 1 refills | Status: DC

## 2020-04-11 MED ORDER — SODIUM CHLORIDE 0.9 % IV SOLN
139.4000 mg | Freq: Once | INTRAVENOUS | Status: AC
  Administered 2020-04-11: 140 mg via INTRAVENOUS
  Filled 2020-04-11: qty 14

## 2020-04-11 MED ORDER — PALONOSETRON HCL INJECTION 0.25 MG/5ML
0.2500 mg | Freq: Once | INTRAVENOUS | Status: AC
  Administered 2020-04-11: 0.25 mg via INTRAVENOUS

## 2020-04-11 MED ORDER — SODIUM CHLORIDE 0.9% FLUSH
10.0000 mL | INTRAVENOUS | Status: DC | PRN
  Administered 2020-04-11: 10 mL
  Filled 2020-04-11: qty 10

## 2020-04-11 MED ORDER — SODIUM CHLORIDE 0.9 % IV SOLN
10.0000 mg | Freq: Once | INTRAVENOUS | Status: AC
  Administered 2020-04-11: 10 mg via INTRAVENOUS
  Filled 2020-04-11: qty 10

## 2020-04-11 MED ORDER — HEPARIN SOD (PORK) LOCK FLUSH 100 UNIT/ML IV SOLN
500.0000 [IU] | Freq: Once | INTRAVENOUS | Status: AC | PRN
  Administered 2020-04-11: 500 [IU]
  Filled 2020-04-11: qty 5

## 2020-04-11 MED ORDER — SODIUM CHLORIDE 0.9 % IV SOLN
Freq: Once | INTRAVENOUS | Status: AC
  Filled 2020-04-11: qty 250

## 2020-04-11 NOTE — Patient Instructions (Signed)
[facility] Discharge Instructions for Patients Receiving Chemotherapy  Today you received the following chemotherapy agents Carboplatinin  To help prevent nausea and vomiting after your treatment, we encourage you to take your nausea medication as directed.  If you develop nausea and vomiting that is not controlled by your nausea medication, call the clinic.   BELOW ARE SYMPTOMS THAT SHOULD BE REPORTED IMMEDIATELY:  *FEVER GREATER THAN 100.5 F  *CHILLS WITH OR WITHOUT FEVER  NAUSEA AND VOMITING THAT IS NOT CONTROLLED WITH YOUR NAUSEA MEDICATION  *UNUSUAL SHORTNESS OF BREATH  *UNUSUAL BRUISING OR BLEEDING  TENDERNESS IN MOUTH AND THROAT WITH OR WITHOUT PRESENCE OF ULCERS  *URINARY PROBLEMS  *BOWEL PROBLEMS  UNUSUAL RASH Items with * indicate a potential emergency and should be followed up as soon as possible.  Feel free to call the clinic should you have any questions or concerns. The clinic phone number is [phone].  Please show the [place] [facility] at check-in to the Emergency Department and triage nurse.

## 2020-04-11 NOTE — Progress Notes (Signed)
Nutrition follow-up completed with patient during infusion.  Patient is receiving concurrent chemoradiation therapy for tongue cancer. Patient continues to report that he is eating well.  He has tried Ensure but does not "love it". Denies nausea, vomiting, constipation, and diarrhea.  He is using an additional 8 ounces of free water daily via PEG.  He denies problems with his feeding tube at this time.  Weight improved and documented as 206 pounds today increased from 201.4 pounds February 14.  Estimated nutrition needs: 2200-2500 cal, 120-135 g protein, 2.4 L fluid.  Nutrition diagnosis: Inadequate oral intake stable.  Intervention: Educated patient to continue strategies for consuming adequate calories and protein. Continue 8 ounces of additional free water via PEG. Provided suggestions on recipes for Ensure.  Recommended patient consume 1 daily.  Monitoring, evaluation, goals: Patient will tolerate adequate calories and protein to minimize weight loss.  Next visit: Monday, February 28 during infusion.  **Disclaimer: This note was dictated with voice recognition software. Similar sounding words can inadvertently be transcribed and this note may contain transcription errors which may not have been corrected upon publication of note.**

## 2020-04-12 ENCOUNTER — Ambulatory Visit
Admission: RE | Admit: 2020-04-12 | Discharge: 2020-04-12 | Disposition: A | Discharge status: Home / Self Care | Payer: Medicare Other | Source: Ambulatory Visit | Attending: Radiation Oncology | Admitting: Radiation Oncology

## 2020-04-12 ENCOUNTER — Other Ambulatory Visit: Payer: Self-pay

## 2020-04-12 DIAGNOSIS — C01 Malignant neoplasm of base of tongue: Secondary | ICD-10-CM | POA: Diagnosis not present

## 2020-04-13 ENCOUNTER — Ambulatory Visit: Payer: Medicare Other

## 2020-04-14 ENCOUNTER — Ambulatory Visit
Admission: RE | Admit: 2020-04-14 | Discharge: 2020-04-14 | Disposition: A | Discharge status: Home / Self Care | Payer: Medicare Other | Source: Ambulatory Visit | Attending: Radiation Oncology | Admitting: Radiation Oncology

## 2020-04-14 DIAGNOSIS — C01 Malignant neoplasm of base of tongue: Secondary | ICD-10-CM | POA: Diagnosis not present

## 2020-04-15 ENCOUNTER — Ambulatory Visit
Admission: RE | Admit: 2020-04-15 | Discharge: 2020-04-15 | Disposition: A | Discharge status: Home / Self Care | Payer: Medicare Other | Source: Ambulatory Visit | Attending: Radiation Oncology | Admitting: Radiation Oncology

## 2020-04-15 ENCOUNTER — Other Ambulatory Visit: Payer: Self-pay

## 2020-04-15 DIAGNOSIS — C01 Malignant neoplasm of base of tongue: Secondary | ICD-10-CM | POA: Diagnosis not present

## 2020-04-18 ENCOUNTER — Ambulatory Visit
Admission: RE | Admit: 2020-04-18 | Discharge: 2020-04-18 | Disposition: A | Discharge status: Home / Self Care | Payer: Medicare Other | Source: Ambulatory Visit | Attending: Radiation Oncology | Admitting: Radiation Oncology

## 2020-04-18 ENCOUNTER — Inpatient Hospital Stay: Payer: Medicare Other

## 2020-04-18 ENCOUNTER — Encounter: Payer: Self-pay | Admitting: Hematology and Oncology

## 2020-04-18 ENCOUNTER — Other Ambulatory Visit: Payer: Self-pay

## 2020-04-18 ENCOUNTER — Inpatient Hospital Stay: Payer: Medicare Other | Admitting: Nutrition

## 2020-04-18 ENCOUNTER — Inpatient Hospital Stay: Payer: Medicare Other | Admitting: Hematology and Oncology

## 2020-04-18 VITALS — BP 120/67 | HR 69 | Temp 97.5°F | Resp 19 | Ht 69.0 in | Wt 202.5 lb

## 2020-04-18 DIAGNOSIS — F5109 Other insomnia not due to a substance or known physiological condition: Secondary | ICD-10-CM

## 2020-04-18 DIAGNOSIS — Z23 Encounter for immunization: Secondary | ICD-10-CM | POA: Diagnosis not present

## 2020-04-18 DIAGNOSIS — I4891 Unspecified atrial fibrillation: Secondary | ICD-10-CM | POA: Diagnosis not present

## 2020-04-18 DIAGNOSIS — C109 Malignant neoplasm of oropharynx, unspecified: Secondary | ICD-10-CM | POA: Diagnosis not present

## 2020-04-18 DIAGNOSIS — Z95828 Presence of other vascular implants and grafts: Secondary | ICD-10-CM

## 2020-04-18 DIAGNOSIS — N179 Acute kidney failure, unspecified: Secondary | ICD-10-CM

## 2020-04-18 DIAGNOSIS — C01 Malignant neoplasm of base of tongue: Secondary | ICD-10-CM

## 2020-04-18 DIAGNOSIS — Z5111 Encounter for antineoplastic chemotherapy: Secondary | ICD-10-CM | POA: Diagnosis not present

## 2020-04-18 DIAGNOSIS — I2510 Atherosclerotic heart disease of native coronary artery without angina pectoris: Secondary | ICD-10-CM | POA: Diagnosis not present

## 2020-04-18 DIAGNOSIS — G4700 Insomnia, unspecified: Secondary | ICD-10-CM | POA: Diagnosis not present

## 2020-04-18 DIAGNOSIS — E119 Type 2 diabetes mellitus without complications: Secondary | ICD-10-CM | POA: Diagnosis not present

## 2020-04-18 DIAGNOSIS — I10 Essential (primary) hypertension: Secondary | ICD-10-CM | POA: Diagnosis not present

## 2020-04-18 LAB — CBC WITH DIFFERENTIAL/PLATELET
Abs Immature Granulocytes: 0 10*3/uL (ref 0.00–0.07)
Basophils Absolute: 0 10*3/uL (ref 0.0–0.1)
Basophils Relative: 0 %
Eosinophils Absolute: 0 10*3/uL (ref 0.0–0.5)
Eosinophils Relative: 0 %
HCT: 30 % — ABNORMAL LOW (ref 39.0–52.0)
Hemoglobin: 9.9 g/dL — ABNORMAL LOW (ref 13.0–17.0)
Immature Granulocytes: 0 %
Lymphocytes Relative: 12 %
Lymphs Abs: 0.4 10*3/uL — ABNORMAL LOW (ref 0.7–4.0)
MCH: 27.7 pg (ref 26.0–34.0)
MCHC: 33 g/dL (ref 30.0–36.0)
MCV: 83.8 fL (ref 80.0–100.0)
Monocytes Absolute: 0.4 10*3/uL (ref 0.1–1.0)
Monocytes Relative: 11 %
Neutro Abs: 2.5 10*3/uL (ref 1.7–7.7)
Neutrophils Relative %: 77 %
Platelets: 120 10*3/uL — ABNORMAL LOW (ref 150–400)
RBC: 3.58 MIL/uL — ABNORMAL LOW (ref 4.22–5.81)
RDW: 14 % (ref 11.5–15.5)
WBC: 3.3 10*3/uL — ABNORMAL LOW (ref 4.0–10.5)
nRBC: 0 % (ref 0.0–0.2)

## 2020-04-18 LAB — MAGNESIUM: Magnesium: 1.5 mg/dL — ABNORMAL LOW (ref 1.7–2.4)

## 2020-04-18 LAB — CMP (CANCER CENTER ONLY)
ALT: 17 U/L (ref 0–44)
AST: 16 U/L (ref 15–41)
Albumin: 3 g/dL — ABNORMAL LOW (ref 3.5–5.0)
Alkaline Phosphatase: 59 U/L (ref 38–126)
Anion gap: 9 (ref 5–15)
BUN: 43 mg/dL — ABNORMAL HIGH (ref 8–23)
CO2: 24 mmol/L (ref 22–32)
Calcium: 8.5 mg/dL — ABNORMAL LOW (ref 8.9–10.3)
Chloride: 96 mmol/L — ABNORMAL LOW (ref 98–111)
Creatinine: 1.95 mg/dL — ABNORMAL HIGH (ref 0.61–1.24)
GFR, Estimated: 37 mL/min — ABNORMAL LOW (ref >60)
Glucose, Bld: 186 mg/dL — ABNORMAL HIGH (ref 70–99)
Potassium: 4.3 mmol/L (ref 3.5–5.1)
Sodium: 129 mmol/L — ABNORMAL LOW (ref 135–145)
Total Bilirubin: 0.4 mg/dL (ref 0.3–1.2)
Total Protein: 6.4 g/dL — ABNORMAL LOW (ref 6.5–8.1)

## 2020-04-18 MED ORDER — PALONOSETRON HCL INJECTION 0.25 MG/5ML
INTRAVENOUS | Status: AC
  Filled 2020-04-18: qty 5

## 2020-04-18 MED ORDER — SODIUM CHLORIDE 0.9 % IV SOLN
Freq: Once | INTRAVENOUS | Status: AC
  Filled 2020-04-18: qty 250

## 2020-04-18 MED ORDER — SODIUM CHLORIDE 0.9 % IV SOLN
140.0000 mg | Freq: Once | INTRAVENOUS | Status: AC
  Administered 2020-04-18: 140 mg via INTRAVENOUS
  Filled 2020-04-18: qty 14

## 2020-04-18 MED ORDER — SODIUM CHLORIDE 0.9% FLUSH
10.0000 mL | INTRAVENOUS | Status: DC | PRN
  Administered 2020-04-18: 10 mL via INTRAVENOUS
  Filled 2020-04-18: qty 10

## 2020-04-18 MED ORDER — PALONOSETRON HCL INJECTION 0.25 MG/5ML
0.2500 mg | Freq: Once | INTRAVENOUS | Status: AC
  Administered 2020-04-18: 0.25 mg via INTRAVENOUS

## 2020-04-18 MED ORDER — MAGNESIUM SULFATE 2 GM/50ML IV SOLN
2.0000 g | Freq: Once | INTRAVENOUS | Status: AC
  Administered 2020-04-18: 2 g via INTRAVENOUS

## 2020-04-18 MED ORDER — MAGNESIUM SULFATE 2 GM/50ML IV SOLN
INTRAVENOUS | Status: AC
  Filled 2020-04-18: qty 50

## 2020-04-18 MED ORDER — ZOLPIDEM TARTRATE 5 MG PO TABS: 5.0000 mg | ORAL_TABLET | Freq: Every evening | ORAL | 0 refills | Status: DC | PRN

## 2020-04-18 MED ORDER — HEPARIN SOD (PORK) LOCK FLUSH 100 UNIT/ML IV SOLN
500.0000 [IU] | Freq: Once | INTRAVENOUS | Status: AC | PRN
  Administered 2020-04-18: 500 [IU]
  Filled 2020-04-18: qty 5

## 2020-04-18 MED ORDER — SODIUM CHLORIDE 0.9% FLUSH
10.0000 mL | INTRAVENOUS | Status: DC | PRN
  Administered 2020-04-18: 10 mL
  Filled 2020-04-18: qty 10

## 2020-04-18 MED ORDER — SODIUM CHLORIDE 0.9 % IV SOLN
10.0000 mg | Freq: Once | INTRAVENOUS | Status: AC
  Administered 2020-04-18: 10 mg via INTRAVENOUS
  Filled 2020-04-18: qty 10

## 2020-04-18 NOTE — Progress Notes (Signed)
Per Dr. Chryl Heck, okay for patient to proceed with treatment today with creatine 1.95.

## 2020-04-18 NOTE — Progress Notes (Signed)
Mg = 1.5.  V.O. for Mg Sulf 2 g IV over 1 hour today.  Kennith Center, Pharm.D., CPP 04/18/2020@11 :04 AM

## 2020-04-18 NOTE — Progress Notes (Signed)
[place] NOTE  Patient Care Team: [name], MD as PCP - General (Family Medicine) [name], [name], RN as Oncology Nurse Navigator [name], MD as Consulting Physician (Radiation Oncology) [name], MD as Consulting Physician (Hematology and Oncology) [name], RD as Dietitian (Nutrition) [name], [name], [name] as Speech Language Pathologist (Speech Pathology) [name], [name], PT as Physical Therapist (Physical Therapy) Justice Britain as Social Worker (General Practice)  CHIEF COMPLAINTS/PURPOSE OF CONSULTATION:  SCC of oropharynx.  ASSESSMENT & PLAN:  No problem-specific Assessment & Plan notes found for this encounter.  No orders of the defined types were placed in this encounter.  This is a 65 year old male patient with past medical history significant for diabetes, hypertension, coronary artery disease, stroke, atrial fibrillation on anticoagulation, obesity referred to medical oncology for evaluation and recommendations regarding new diagnosis of squamous cell carcinoma of oropharynx.  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 I have reviewed the current recommendations for stage III HPV positive oropharyngeal cancer with the patient today which is primarily concurrent chemoradiation. Given his comorbidities, age, I think he is a good candidate for weekly cisplatin along with radiation.  We have discussed about adverse side effects of cisplatin including but not limited to fatigue, nausea, vomiting, cytopenias, increased risk of infections, permanent hearing loss, nephrotoxicity etc. Baseline audiology referral done by Dr Benjamine Mola, no severe hearing loss noted. Given his creatinine and GFR, we started off with 30 mg/m2 weekly cisplatin. Creatinine and GFR continue to be sub optimal, so we will change chemotherapy to weekly  carboplatin. Discussed adverse effects of carboplatin with the patient including but not limited to fatigue, nausea, vomiting, diarrhea, increased risk of infections and very rarely fatal events. He did very well with his first cycle of carboplatin, 04/04/2020, He is now post 2 weekly cycles of carboplatin. Clinically responding with decreased lymphadenopathy Ok to proceed with carboplatin today, AKI improving. No other concerns  2. Insomnia, he is using 10 mg QHS of ambien. He understands that this medication can cause day time drowsiness and will increase risk of falls. Ok to continue during chemoradiation Likely situational.  Refilled today  3. AKI, likely induced due to cisplatin with some baseline impairment. Improving. He has a nephrologist Dr Louie Boston at [facility] I have encouraged him to make an appointment with her to monitor CKD.  4. Drug induced hypomagnesemia. Likely from renal wasting of magnesium. Mg replacement today  FU scheduled.  [name] MD   HISTORY OF PRESENTING ILLNESS:   Brian [name] is a 65 y.o. male who presented two-week history of facial swelling in late November/early December 2021. Subsequently, the patient saw Dr. Benjamine Mola, who performed a direct laryngoscopy with biopsy on the date of 02/10/2020. Final pathology revealed: squamous cell carcinoma, P16 positive  He had the following imaging performed  1. Soft tissue neck CT scan performed on 01/28/2020 revealing right glossotonsillar malignancy with bulky bilateral nodal disease. Nodal masses sowed definite extracapsular spread. There was also noted to be incidental advanced cervical spine degeneration with cord compression.  2. PET scan performed on 02/26/2020 revealing a hypermetabolic lesion at the right base of the tongue and right tonsil, consistent with oropharyngeal carcinoma. Carcinoma appeared to be confined to the mucosal surface. There were also noted to bulky  bilateral hypermetabolic level II metastatic lymph nodes and smaller inferior hypermetabolic level II metastatic lymph nodes. There was no  evidence of metastatic disease in the chest, abdomen, or pelvis. The two small right middle lobe pulmonary nodules present on CT from 2012 were consistent with benign etiology.  Started radiation on 03/14/2020 Chemo delayed by a week due to GFR and infusion issues. Started chemotherapy on 03/21/2020 C2 delayed because of AKI Now switched to weekly carboplatin given ongoing AKI C1 of weekly carboplatin on 04/04/2020 C2 of weekly carboplatin on 04/11/2020 C3 of weekly carboplatin on 04/18/2020  Interim History  He is doing well. He denies any nausea, vomiting He has good appetite and eating very well although food doesn't taste good No change in breathing No change in bowel habits or urinary habits No new neurological complaints. Hearing well. No neuropathy No pain, maintaining weight well Still having trouble sleeping  REVIEW OF SYSTEMS:   Constitutional: Denies fevers, chills or abnormal night sweats Eyes: Denies blurriness of vision, double vision or watery eyes Ears, nose, mouth, throat, and face: Denies mucositis or sore throat.  He is able to swallow everything Respiratory: Denies cough, dyspnea or wheezes Cardiovascular: Denies palpitation, chest discomfort or lower extremity swelling Gastrointestinal:  Denies nausea, heartburn or change in bowel habits Skin: Denies abnormal skin rashes Lymphatics: Complains of masses in the neck  neurological:Denies numbness, tingling or new weaknesses Behavioral/Psych: Mood is stable, no new changes  All other systems were reviewed with the patient and are negative.  MEDICAL HISTORY:  Past Medical History:  Diagnosis Date  . Arthritis   . Atrial fibrillation ([place])   . Cardiomyopathy   . Cerebrovascular accident ([place])    2012  . Diabetes mellitus   . Hyperlipidemia   . Hypertension   . Left-sided  weakness   . 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)    Dr. Zigmund Daniel, laser therapy OD  . Seizures ([place])    pt reports this is from low blood sugar    SURGICAL HISTORY: Past Surgical History:  Procedure Laterality Date  . DIRECT LARYNGOSCOPY N/A 02/10/2020   Procedure: DIRECT LARYNGOSCOPY WITH BIOPSY;  Surgeon: [name], MD;  Location: [facility];  Service: ENT;  Laterality: N/A;  . Ear surgery as a child    . IR GASTROSTOMY TUBE MOD SED  03/23/2020  . IR IMAGING GUIDED PORT INSERTION  03/09/2020  . TONSILLECTOMY      SOCIAL HISTORY: Social History   Socioeconomic History  . Marital status: Divorced    Spouse name: Not on file  . Number of children: 2  . Years of education: Not on file  . Highest education level: Not on file  Occupational History  . Occupation: Disabled  Tobacco Use  . Smoking status: Never Smoker  . Smokeless tobacco: Never Used  Vaping Use  . Vaping Use: Never used  Substance and Sexual Activity  . Alcohol use: Yes    Alcohol/week: 0.0 standard drinks    Comment: seldom  . Drug use: Yes    Frequency: 7.0 times per week    Comment: marijauna for arthritis, last 03-13-20  . Sexual activity: Not Currently  Other Topics Concern  . Not on file  Social History Narrative   He has children.   Social Determinants of Health   Financial Resource Strain: Low Risk   . Difficulty of Paying Living Expenses: Not very hard  Food Insecurity: No Food Insecurity  . Worried About Charity fundraiser in the Last Year: Never true  . Ran Out of Food in the Last Year: Never true  Transportation Needs: No Transportation  Needs  . Lack of Transportation (Medical): No  . Lack of Transportation (Non-Medical): No  Physical Activity: Not on file  Stress: Stress Concern Present  . Feeling of Stress : To some extent  Social Connections: Socially Isolated  . Frequency of Communication with Friends and Family: More than three times a week  . Frequency of Social Gatherings with  Friends and Family: More than three times a week  . Attends Religious Services: Never  . Active Member of Clubs or Organizations: No  . Attends Archivist Meetings: Never  . Marital Status: Divorced  Human resources officer Violence: Not on file    FAMILY HISTORY: Family History  Problem Relation Age of Onset  . Hyperlipidemia Father   . Hypertension Father   . Coronary artery disease Neg Hx        no early CAD.  Marland Kitchen Colon cancer Neg Hx   . Stomach cancer Neg Hx   . Rectal cancer Neg Hx   . Esophageal cancer Neg Hx     ALLERGIES:  is allergic to codeine.  MEDICATIONS:  Current Outpatient Medications  Medication Sig Dispense Refill  . amLODipine (NORVASC) 10 MG tablet Take 1 tablet (10 mg total) by mouth daily. 90 tablet 1  . atorvastatin (LIPITOR) 20 MG tablet TAKE 1 TABLET (20 MG TOTAL) DAILY BY MOUTH. 90 tablet 3  . Blood Glucose Monitoring Suppl (ONETOUCH VERIO IQ SYSTEM) w/Device KIT 1 each by Does not apply route 2 (two) times daily. 1 kit 0  . carvedilol (COREG) 25 MG tablet TAKE 1 TABLET BY MOUTH TWICE A DAY WITH A MEAL (Patient taking differently: Take 25 mg by mouth 2 (two) times daily with a meal.) 180 tablet 3  . chlorthalidone (HYGROTON) 25 MG tablet TAKE 1 TABLET BY MOUTH EVERY DAY 90 tablet 1  . cloNIDine (CATAPRES) 0.1 MG tablet Take 1 tablet (0.1 mg total) by mouth 2 (two) times daily. 60 tablet 3  . Continuous Blood Gluc Receiver (FREESTYLE LIBRE 2 READER) DEVI Use as directed to monitor blood glucose continuously. DX E11.65 1 each 1  . Continuous Blood Gluc Sensor (FREESTYLE LIBRE 2 SENSOR) MISC Use as directed to monitor blood glucose continuously. Change sensor Q 14 days. DX E11.65 2 each 11  . dexamethasone (DECADRON) 4 MG tablet Take 2 tablets (8 mg total) by mouth daily. Take daily x 3 days starting the day after cisplatin chemotherapy. Take with food. 30 tablet 1  . furosemide (LASIX) 20 MG tablet TAKE 1 TABLET (20 MG TOTAL) BY MOUTH DAILY AS NEEDED FOR  FLUID. 90 tablet 1  . glucose blood test strip 1 each by Other route as needed for other. Use as instructed 100 each 11  . hydrALAZINE (APRESOLINE) 50 MG tablet TAKE 1 TABLET BY MOUTH THREE TIMES A DAY (Patient taking differently: Take 50 mg by mouth 3 (three) times daily.) 270 tablet 1  . Insulin Pen Needle (B-D UF III MINI PEN NEEDLES) 31G X 5 MM MISC USE DAILY WITH INSULIN 100 each 5  . Insulin Syringe-Needle U-100 (INSULIN SYRINGE .3CC/31GX5/16") 31G X 5/16" 0.3 ML MISC Use twice daily to inject insulin. 100 each 10  . LEVEMIR FLEXTOUCH 100 UNIT/ML FlexPen INJECT 70 UNITS UNDER THE SKIN EVERY MORNING AND 40 UNITS AT BEDTIME 15 mL 3  . lidocaine (XYLOCAINE) 2 % solution Patient: Mix 1part 2% viscous lidocaine, 1part H20. Swish & swallow 68m of diluted mixture, 321m before meals and at bedtime, up to QID 200 mL 4  .  lidocaine-prilocaine (EMLA) cream Apply to affected area once 30 g 3  . LORazepam (ATIVAN) 0.5 MG tablet Take 1 tablet (0.5 mg total) by mouth every 6 (six) hours as needed (Nausea or vomiting). 30 tablet 0  . losartan (COZAAR) 50 MG tablet Take 1 tablet (50 mg total) by mouth daily. 90 tablet 3  . ondansetron (ZOFRAN) 8 MG tablet Take 1 tablet (8 mg total) by mouth 2 (two) times daily as needed. Start on the third day after cisplatin chemotherapy. 30 tablet 1  . OneTouch Delica Lancets 38S MISC 1 each by Does not apply route 2 (two) times daily. 200 each 11  . pioglitazone (ACTOS) 15 MG tablet Take 1 tablet (15 mg total) by mouth daily. 90 tablet 2  . prochlorperazine (COMPAZINE) 10 MG tablet Take 1 tablet (10 mg total) by mouth every 6 (six) hours as needed (Nausea or vomiting). 30 tablet 1  . warfarin (COUMADIN) 10 MG tablet Take 1 tablet (10 mg total) by mouth daily. 90 tablet 3  . zolpidem (AMBIEN) 5 MG tablet Take 1 tablet (5 mg total) by mouth at bedtime as needed for sleep. 30 tablet 0   No current facility-administered medications for this visit.   Facility-Administered  Medications Ordered in Other Visits  Medication Dose Route Frequency Provider Last Rate Last Admin  . sodium chloride flush (NS) 0.9 % injection 10 mL  10 mL Intravenous PRN [name], MD   10 mL at 04/18/20 0910     PHYSICAL EXAMINATION: ECOG PERFORMANCE STATUS: 0 - Asymptomatic  There were no vitals filed for this visit. There were no vitals filed for this visit. Physical Exam Constitutional:      Appearance: Normal appearance.  HENT:     Head: Normocephalic and atraumatic.     Nose: Nose normal.     Mouth/Throat:     Mouth: Mucous membranes are moist.  Cardiovascular:     Rate and Rhythm: Normal rate and regular rhythm.     Pulses: Normal pulses.     Heart sounds: Normal heart sounds.  Pulmonary:     Effort: Pulmonary effort is normal.     Breath sounds: Normal breath sounds.  Abdominal:     General: Abdomen is flat.     Palpations: Abdomen is soft.  Musculoskeletal:        General: No swelling. Normal range of motion.     Cervical back: Normal range of motion and neck supple.  Lymphadenopathy:     Cervical: Cervical adenopathy (not palpable today. No mucositis noted) present.  Skin:    General: Skin is warm and dry.  Neurological:     General: No focal deficit present.     Mental Status: He is alert and oriented to person, place, and time.  Psychiatric:        Mood and Affect: Mood normal.        Behavior: Behavior normal.      LABORATORY DATA:  I have reviewed the data as listed Lab Results  Component Value Date   WBC 4.2 04/07/2020   HGB 10.1 (L) 04/07/2020   HCT 29.7 (L) 04/07/2020   MCV 81.6 04/07/2020   PLT 127 (L) 04/07/2020     Chemistry      Component Value Date/Time   NA 128 (L) 04/07/2020 0843   K 4.4 04/07/2020 0843   CL 97 (L) 04/07/2020 0843   CO2 25 04/07/2020 0843   BUN 50 (H) 04/07/2020 0843   CREATININE 2.13 (H) 04/07/2020  5397   CREATININE 2.21 (H) 04/06/2020 0811   CREATININE 1.23 11/30/2019 0915      Component Value  Date/Time   CALCIUM 8.5 (L) 04/07/2020 0843   ALKPHOS 58 04/04/2020 0814   AST 15 04/04/2020 0814   ALT 16 04/04/2020 0814   BILITOT 0.4 04/04/2020 0814       RADIOGRAPHIC STUDIES: I have personally reviewed the radiological images as listed and agreed with the findings in the report. IR Gastrostomy Tube  Result Date: 03/23/2020 INDICATION: 65 year old male with oropharyngeal cancer requiring percutaneous enteric access. EXAM: PERC PLACEMENT GASTROSTOMY MEDICATIONS: Ancef 2 gm IV; Antibiotics were administered within 1 hour of the procedure. ANESTHESIA/SEDATION: Versed 2 mg IV; Fentanyl 50 mcg IV Moderate Sedation Time:  15 The patient was continuously monitored during the procedure by the interventional radiology nurse under my direct supervision. CONTRAST:  16m OMNIPAQUE IOHEXOL 300 MG/ML SOLN - administered into the gastric lumen. FLUOROSCOPY TIME:  Fluoroscopy Time: 1 minutes 18 seconds (31 mGy). COMPLICATIONS: None immediate. PROCEDURE: Informed written consent was obtained from the patient after a thorough discussion of the procedural risks, benefits and alternatives. All questions were addressed. Maximal Sterile Barrier Technique was utilized including caps, mask, sterile gowns, sterile gloves, sterile drape, hand hygiene and skin antiseptic. A timeout was performed prior to the initiation of the procedure. The patient was placed on the procedure table in the supine position. Pre-procedure abdominal film confirmed visualization of the transverse colon. An angled 5-French catheter was passed through the nares into the stomach. The patient was prepped and draped in usual sterile fashion. The stomach was insufflated with air via the indwelling nasogastric tube. Under fluoroscopy, a puncture site was selected and local analgesia achieved with 1% lidocaine infiltrated subcutaneously. Under fluoroscopic guidance, a gastropexy needle was passed into the stomach and the T-bar suture was released. Entry  into the stomach was confirmed with fluoroscopy, aspiration of air, and injection of contrast material. This was repeated with an additional gastropexy suture (for a total of 2 fasteners). At the center of these gastropexy sutures, a dermatotomy was performed. An 18 gauge needle was passed into the stomach at the site of this dermatotomy, and position within the gastric lumen again confirmed under fluoroscopy using aspiration of air and contrast injection. An Amplatz guidewire was passed through this needle and intraluminal placement within the stomach was confirmed by fluoroscopy. The needle was removed. Over the guidewire, the percutaneous tract was dilated using a 10 mm non-compliant balloon. The balloon was deflated, then pushed into the gastric lumen followed in concert by the 20 Fr gastrostomy tube. The retention balloon of the percutaneous gastrostomy tube was inflated with 20 mL of sterile water. The tube was withdrawn until the retention balloon was at the edge of the gastric lumen. The external bumper was brought to the abdominal wall. Contrast was injected through the gastrostomy tube, confirming intraluminal positioning. The patient tolerated the procedure well without any immediate post-procedural complications. IMPRESSION: Technically successful placement of 20 Fr gastrostomy tube. DRuthann Cancer MD [facility] Plaza[facility]iology Electronically Signed   By: DRuthann CancerMD   On: 03/23/2020 13:19   I reviewed the image myself.  Noted hypermetabolic lesions at the base of tongue on the right side and right tonsil as well as the bilateral bulky lymphadenopathy.  02/10/2020  SURGICAL PATHOLOGY    Reason for Addendum #1: Immunohistochemistry results   Clinical History: throat cancer (cm)    FINAL MICROSCOPIC DIAGNOSIS:  A. OROPHARYNGEAL, RIGHT, BIOPSY:  - Squamous cell carcinoma  - See comment   COMMENT:   P16 immunohistochemistry is pending  and will be reported in an addendum.  Dr. Tresa Moore reviewed the case and agrees with the above diagnosis. Dr.  Benjamine Mola was notified of these results on February 11, 2020.   GROSS DESCRIPTION:   The specimen is received in saline and consists of a 1.6 x 1.5 x 0.3 cm  aggregate of tan-pink soft tissue. The specimen is entirely submitted  in 1 cassette. Craig Staggers 02/10/2020)    Final Diagnosis performed by Thressa Sheller, MD.  Electronically signed  02/11/2020  Technical and / or Professional components performed at [facility]. [facility], [place] [address] [place], [place] [address].  Immunohistochemistry Technical component (if applicable) was performed  at [facility]. [address] [place],  [place],  [address].  IMMUNOHISTOCHEMISTRY DISCLAIMER (if applicable):  Some of these immunohistochemical stains may have been developed and the  performance characteristics determine by [facility]. Some  may not have been cleared or approved by the U.S. Food and Drug  Administration. The FDA has determined that such clearance or approval  is not necessary. This test is used for clinical purposes. It should not  be regarded as investigational or for research. This laboratory is  certified under the [place]  (CLIA-88) as qualified to perform high complexity clinical laboratory  testing. The controls stained appropriately.   ADDENDUM:   P16 immunohistochemistry is POSITIVE.   Reviewed labs. AKI and low Mg. Will provide Mag replacement. Encouraged oral hydration Will proceed with chemo today.  All questions were answered. The patient knows to call the clinic with any problems, questions or concerns.[name], MD 04/18/2020 9:11 AM

## 2020-04-18 NOTE — Patient Instructions (Signed)

## 2020-04-18 NOTE — Progress Notes (Signed)
Nutrition follow-up completed with patient during infusion for tongue cancer.  Patient is receiving concurrent chemoradiation therapy. Patient continues to report heat is eating well and drinking well.   Weight documented as 202.8 pounds today, relatively stable. Denies nausea, vomiting, constipation, and diarrhea. Only side effect mentioned is decreased taste of certain foods.  This has not affected his oral intake. He denies problems with his feeding tube and continues to flush daily with water.  Estimated nutrition needs: 2200-2500 cal, 120-135 g protein, 2.4 L fluid.  Nutrition diagnosis: Inadequate oral intake is stable.  Intervention: Educated to continue same strategies for increasing calories and protein. Continue 8 ounces of additional free water via PEG. Encourage patient to add Ensure by mouth or Osmolite 1.5 to PEG if he notices decreased oral intake between now and next visit.  Next visit: Monday, March 7 during infusion.  **Disclaimer: This note was dictated with voice recognition software. Similar sounding words can inadvertently be transcribed and this note may contain transcription errors which may not have been corrected upon publication of note.**

## 2020-04-18 NOTE — Patient Instructions (Signed)
[facility] Discharge Instructions for Patients Receiving Chemotherapy  Today you received the following chemotherapy agents Carboplatin  To help prevent nausea and vomiting after your treatment, we encourage you to take your nausea medication as directed   If you develop nausea and vomiting that is not controlled by your nausea medication, call the clinic.   BELOW ARE SYMPTOMS THAT SHOULD BE REPORTED IMMEDIATELY:  *FEVER GREATER THAN 100.5 F  *CHILLS WITH OR WITHOUT FEVER  NAUSEA AND VOMITING THAT IS NOT CONTROLLED WITH YOUR NAUSEA MEDICATION  *UNUSUAL SHORTNESS OF BREATH  *UNUSUAL BRUISING OR BLEEDING  TENDERNESS IN MOUTH AND THROAT WITH OR WITHOUT PRESENCE OF ULCERS  *URINARY PROBLEMS  *BOWEL PROBLEMS  UNUSUAL RASH Items with * indicate a potential emergency and should be followed up as soon as possible.  Feel free to call the clinic should you have any questions or concerns. The clinic phone number is [phone].  Please show the CHEMO ALERT CARD at check-in to the Emergency Department and triage nurse.   

## 2020-04-19 ENCOUNTER — Other Ambulatory Visit: Payer: Self-pay

## 2020-04-19 ENCOUNTER — Ambulatory Visit
Admission: RE | Admit: 2020-04-19 | Discharge: 2020-04-19 | Disposition: A | Discharge status: Home / Self Care | Payer: Medicare Other | Source: Ambulatory Visit | Attending: Radiation Oncology | Admitting: Radiation Oncology

## 2020-04-19 DIAGNOSIS — C01 Malignant neoplasm of base of tongue: Secondary | ICD-10-CM | POA: Insufficient documentation

## 2020-04-20 ENCOUNTER — Other Ambulatory Visit: Payer: Self-pay | Admitting: Radiation Oncology

## 2020-04-20 ENCOUNTER — Ambulatory Visit
Admission: RE | Admit: 2020-04-20 | Discharge: 2020-04-20 | Disposition: A | Discharge status: Home / Self Care | Payer: Medicare Other | Source: Ambulatory Visit | Attending: Radiation Oncology | Admitting: Radiation Oncology

## 2020-04-20 DIAGNOSIS — C108 Malignant neoplasm of overlapping sites of oropharynx: Secondary | ICD-10-CM

## 2020-04-20 DIAGNOSIS — C01 Malignant neoplasm of base of tongue: Secondary | ICD-10-CM

## 2020-04-20 MED ORDER — TEMAZEPAM 22.5 MG PO CAPS: 22.5000 mg | ORAL_CAPSULE | Freq: Every evening | ORAL | 0 refills | Status: DC | PRN

## 2020-04-20 NOTE — Progress Notes (Addendum)
Pt presents today with bad insomnia and Ambien isn't working. Tried Ativan 1mg  QHS which is not helping either.  Temazepam Rx given but I worry insurance will not cover it.  If insurance won't cover it he will try 2mg  Ativan (0.5 mg tabs x 4 tabs) QHS. He already has these tablets for nausea PRN.   I will handle refills for Ativan from now on.   Patient informed of risks of escalating medications for insomnia and wants to proceed as he cannot function without sleep.  Med/onc informed.  -----------------------------------  [name], MD

## 2020-04-21 ENCOUNTER — Ambulatory Visit: Payer: Medicare Other | Attending: Radiation Oncology

## 2020-04-21 ENCOUNTER — Ambulatory Visit
Admission: RE | Admit: 2020-04-21 | Discharge: 2020-04-21 | Disposition: A | Discharge status: Home / Self Care | Payer: Medicare Other | Source: Ambulatory Visit | Attending: Radiation Oncology | Admitting: Radiation Oncology

## 2020-04-21 DIAGNOSIS — R1310 Dysphagia, unspecified: Secondary | ICD-10-CM | POA: Insufficient documentation

## 2020-04-21 DIAGNOSIS — C01 Malignant neoplasm of base of tongue: Secondary | ICD-10-CM | POA: Diagnosis not present

## 2020-04-21 NOTE — Therapy (Signed)
[facility] [address] [place], [name], [address] Phone: [phone]   Fax:  [phone]  Speech Language Pathology Treatment  Patient Details  Name: Brian [name] MRN: [id number] Date of Birth: 03-26-1955 Referring Provider (SLP): [name], MD   Encounter Date: 04/21/2020   End of Session - 04/21/20 1600    Visit Number 2    Number of Visits 7    Date for SLP Re-Evaluation 06/22/20    SLP Start Time 0850    SLP Stop Time  0920    SLP Time Calculation (min) 30 min    Activity Tolerance Patient tolerated treatment well           Past Medical History:  Diagnosis Date  . Arthritis   . Atrial fibrillation ([place])   . Cardiomyopathy   . Cerebrovascular accident ([place])    2012  . Diabetes mellitus   . Hyperlipidemia   . Hypertension   . Left-sided weakness   . 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 Zigmund Daniel, laser therapy OD  . Seizures ([place])    pt reports this is from low blood sugar    Past Surgical History:  Procedure Laterality Date  . DIRECT LARYNGOSCOPY N/A 02/10/2020   Procedure: DIRECT LARYNGOSCOPY WITH BIOPSY;  Surgeon: [name], MD;  Location: [facility];  Service: ENT;  Laterality: N/A;  . Ear surgery as a child    . IR GASTROSTOMY TUBE MOD SED  03/23/2020  . IR IMAGING GUIDED PORT INSERTION  03/09/2020  . TONSILLECTOMY      There were no vitals filed for this visit.   Subjective Assessment - 04/21/20 0856    Subjective Scrambled eggs, sauaage with juice and coffee for breakfast.    Currently in Pain? No/denies                 ADULT SLP TREATMENT - 04/21/20 0857      General Information   Behavior/Cognition Alert;Cooperative;Pleasant mood      Treatment Provided   Treatment provided Dysphagia      Dysphagia Treatment   Treatment Methods Skilled observation;Therapeutic exercise;Patient/caregiver education    Patient observed directly with PO's Yes    Type of PO's  observed Dysphagia 3 (soft);Thin liquids    Oral Phase Signs & Symptoms Other (comment)   none noted today   Pharyngeal Phase Signs & Symptoms --   none noted today   Other treatment/comments Pt still 100% PO. He req'd mod-max A usually with HEP today although Dakin told SLP he has been completing HEP indpendently. SLP reiterated the full scope of HEP with frequency as prescribed. Pt was independent with HEP by session end. He req'd total A for rationale for HEP; was independent with this after min SLP ?ing cues. Pt educated pt that may be hard to complete all reps/exercises due to unsuccessful swallowing - so focus on the exercises that DON'T require swallowing if necessary, until he feels he can swallow with more success -then add back in.      Dysphagia Recommendations   Diet recommendations --   as tolerated   Medication Administration --   as tolerated     Progression Toward Goals   Progression toward goals Not progressing toward goals (comment)   ? adherence to HEP           SLP Education - 04/21/20 1559    Education Details late effects head/neck radiaiton on swallowing, do non-swallowing exercises if necessary  until easier to do exercises that require swallowing, HEP procedure    Person(s) Educated Patient    Methods Explanation;Demonstration;Handout;Verbal cues    Comprehension Verbalized understanding;Returned demonstration;Verbal cues required;Need further instruction            SLP Short Term Goals - 04/21/20 1147      SLP SHORT TERM GOAL #1   Title Pt will complete HEP with rare min A over two sessions    Time 2    Period --   visits, for all STGs   Status On-going      SLP SHORT TERM GOAL #2   Title Pt will tell SLP rationale for HEP completion    Time 1    Status On-going      SLP SHORT TERM GOAL #3   Title Pt will tell SLP how a food journal can hasten/facilitate return to more normalized diet    Time 2    Status On-going            SLP Long Term Goals  - 04/21/20 1146      SLP LONG TERM GOAL #1   Title Pt will complete HEP with modified independence over 3 visits    Time 4    Period --   visits, for all LTGs   Status On-going      SLP LONG TERM GOAL #2   Title Pt will tell SLP 3 overt s/s aspiration PNA with modified independence in 2 sessions    Time 3    Status On-going      SLP LONG TERM GOAL #3   Title Pt will tell SLP when to decr frequency of HEP    Time 5    Status On-going            Plan - 04/21/20 1601    Clinical Impression Statement Alwin presents today with WNL/WFL swallowing ability with [place] sandwich and water .  No overt s/sx oral or pharyngeal difficulty, or of aspiration PNA reported or observed today. Data suggests that as pts progress through rad or chemorad therapy that their swallowing ability will decrease. Also, WNL swallowing is threatened by muscle fibrosis that will likely develop after rad/chemorad is completed. See "other comments" for more details of today's session. SLP reviewed his individualized exercise program. ST is still necessary for addressing need for objective swallow assessment, as well as to assess accurate completion of swallowing HEP.    Speech Therapy Frequency --   once approx every 4 weeks   Duration --   7 total visits   Treatment/Interventions Aspiration precaution training;Pharyngeal strengthening exercises;Diet toleration management by SLP;Trials of upgraded texture/liquids;Cueing hierarchy;SLP instruction and feedback;Patient/family education;Functional tasks;Other (comment)    Potential to Achieve Goals Good    SLP Home Exercise Plan provided today    Consulted and Agree with Plan of Care Patient           Patient will benefit from skilled therapeutic intervention in order to improve the following deficits and impairments:   Dysphagia, unspecified type    Problem List Patient Active Problem List   Diagnosis Date Noted  . Malignant neoplasm of base of tongue ([place])  03/02/2020  . 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 Status epilepticus ([place]) 01/18/2013  . 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 status epilepticus, not intractable ([place]) 01/15/2013  . Hyperlipidemia   . Long term current use of anticoagulant 03/11/2010  . Diabetes mellitus, type II ([place]) 12/20/2009  . Essential hypertension, benign 12/20/2009  . Atrial  fibrillation ([place]) 12/20/2009  . CONGESTIVE HEART FAILURE, SYSTOLIC DYSFUNCTION 17/98/1025  . CEREBROVASCULAR ACCIDENT 12/20/2009    [facility] ,[place], [place]  04/21/2020, 4:03 PM  [facility] [address] [place], [name], [address] Phone: [phone]   Fax:  [phone]   Name: ADLER CHARTRAND MRN: [id number] Date of Birth: [birth year]

## 2020-04-22 ENCOUNTER — Other Ambulatory Visit: Payer: Self-pay

## 2020-04-22 ENCOUNTER — Ambulatory Visit
Admission: RE | Admit: 2020-04-22 | Discharge: 2020-04-22 | Disposition: A | Discharge status: Home / Self Care | Payer: Medicare Other | Source: Ambulatory Visit | Attending: Radiation Oncology | Admitting: Radiation Oncology

## 2020-04-22 DIAGNOSIS — C01 Malignant neoplasm of base of tongue: Secondary | ICD-10-CM | POA: Diagnosis not present

## 2020-04-25 ENCOUNTER — Inpatient Hospital Stay: Payer: Medicare Other

## 2020-04-25 ENCOUNTER — Other Ambulatory Visit: Payer: Self-pay

## 2020-04-25 ENCOUNTER — Ambulatory Visit
Admission: RE | Admit: 2020-04-25 | Discharge: 2020-04-25 | Disposition: A | Discharge status: Home / Self Care | Payer: Medicare Other | Source: Ambulatory Visit | Attending: Radiation Oncology | Admitting: Radiation Oncology

## 2020-04-25 ENCOUNTER — Inpatient Hospital Stay: Payer: Medicare Other | Admitting: Hematology and Oncology

## 2020-04-25 ENCOUNTER — Inpatient Hospital Stay: Payer: Medicare Other | Attending: Hematology and Oncology

## 2020-04-25 ENCOUNTER — Encounter: Payer: Self-pay | Admitting: Hematology and Oncology

## 2020-04-25 ENCOUNTER — Inpatient Hospital Stay: Payer: Medicare Other | Admitting: Nutrition

## 2020-04-25 VITALS — BP 121/68 | HR 74 | Temp 99.2°F | Resp 19 | Ht 69.0 in | Wt 205.8 lb

## 2020-04-25 DIAGNOSIS — C109 Malignant neoplasm of oropharynx, unspecified: Secondary | ICD-10-CM | POA: Insufficient documentation

## 2020-04-25 DIAGNOSIS — Z5111 Encounter for antineoplastic chemotherapy: Secondary | ICD-10-CM | POA: Insufficient documentation

## 2020-04-25 DIAGNOSIS — G4700 Insomnia, unspecified: Secondary | ICD-10-CM | POA: Diagnosis not present

## 2020-04-25 DIAGNOSIS — C01 Malignant neoplasm of base of tongue: Secondary | ICD-10-CM | POA: Diagnosis not present

## 2020-04-25 DIAGNOSIS — I4891 Unspecified atrial fibrillation: Secondary | ICD-10-CM | POA: Diagnosis not present

## 2020-04-25 DIAGNOSIS — R197 Diarrhea, unspecified: Secondary | ICD-10-CM | POA: Insufficient documentation

## 2020-04-25 DIAGNOSIS — I129 Hypertensive chronic kidney disease with stage 1 through stage 4 chronic kidney disease, or unspecified chronic kidney disease: Secondary | ICD-10-CM | POA: Diagnosis not present

## 2020-04-25 DIAGNOSIS — E1122 Type 2 diabetes mellitus with diabetic chronic kidney disease: Secondary | ICD-10-CM | POA: Insufficient documentation

## 2020-04-25 DIAGNOSIS — N189 Chronic kidney disease, unspecified: Secondary | ICD-10-CM | POA: Diagnosis not present

## 2020-04-25 DIAGNOSIS — Z452 Encounter for adjustment and management of vascular access device: Secondary | ICD-10-CM | POA: Insufficient documentation

## 2020-04-25 LAB — CBC WITH DIFFERENTIAL/PLATELET
Abs Immature Granulocytes: 0.01 10*3/uL (ref 0.00–0.07)
Basophils Absolute: 0 10*3/uL (ref 0.0–0.1)
Basophils Relative: 0 %
Eosinophils Absolute: 0 10*3/uL (ref 0.0–0.5)
Eosinophils Relative: 0 %
HCT: 27 % — ABNORMAL LOW (ref 39.0–52.0)
Hemoglobin: 9 g/dL — ABNORMAL LOW (ref 13.0–17.0)
Immature Granulocytes: 0 %
Lymphocytes Relative: 14 %
Lymphs Abs: 0.5 10*3/uL — ABNORMAL LOW (ref 0.7–4.0)
MCH: 28 pg (ref 26.0–34.0)
MCHC: 33.3 g/dL (ref 30.0–36.0)
MCV: 84.1 fL (ref 80.0–100.0)
Monocytes Absolute: 0.4 10*3/uL (ref 0.1–1.0)
Monocytes Relative: 13 %
Neutro Abs: 2.3 10*3/uL (ref 1.7–7.7)
Neutrophils Relative %: 73 %
Platelets: 99 10*3/uL — ABNORMAL LOW (ref 150–400)
RBC: 3.21 MIL/uL — ABNORMAL LOW (ref 4.22–5.81)
RDW: 14.5 % (ref 11.5–15.5)
WBC: 3.2 10*3/uL — ABNORMAL LOW (ref 4.0–10.5)
nRBC: 0 % (ref 0.0–0.2)

## 2020-04-25 LAB — CMP (CANCER CENTER ONLY)
ALT: 17 U/L (ref 0–44)
AST: 13 U/L — ABNORMAL LOW (ref 15–41)
Albumin: 2.9 g/dL — ABNORMAL LOW (ref 3.5–5.0)
Alkaline Phosphatase: 48 U/L (ref 38–126)
Anion gap: 8 (ref 5–15)
BUN: 46 mg/dL — ABNORMAL HIGH (ref 8–23)
CO2: 26 mmol/L (ref 22–32)
Calcium: 8.5 mg/dL — ABNORMAL LOW (ref 8.9–10.3)
Chloride: 98 mmol/L (ref 98–111)
Creatinine: 2.03 mg/dL — ABNORMAL HIGH (ref 0.61–1.24)
GFR, Estimated: 35 mL/min — ABNORMAL LOW
Glucose, Bld: 198 mg/dL — ABNORMAL HIGH (ref 70–99)
Potassium: 4.3 mmol/L (ref 3.5–5.1)
Sodium: 132 mmol/L — ABNORMAL LOW (ref 135–145)
Total Bilirubin: 0.5 mg/dL (ref 0.3–1.2)
Total Protein: 6.2 g/dL — ABNORMAL LOW (ref 6.5–8.1)

## 2020-04-25 LAB — MAGNESIUM: Magnesium: 1.9 mg/dL (ref 1.7–2.4)

## 2020-04-25 MED ORDER — SODIUM CHLORIDE 0.9 % IV SOLN
143.8000 mg | Freq: Once | INTRAVENOUS | Status: AC
  Administered 2020-04-25: 140 mg via INTRAVENOUS
  Filled 2020-04-25: qty 14

## 2020-04-25 MED ORDER — SODIUM CHLORIDE 0.9 % IV SOLN
Freq: Once | INTRAVENOUS | Status: AC
  Filled 2020-04-25: qty 250

## 2020-04-25 MED ORDER — PALONOSETRON HCL INJECTION 0.25 MG/5ML
0.2500 mg | Freq: Once | INTRAVENOUS | Status: AC
  Administered 2020-04-25: 0.25 mg via INTRAVENOUS

## 2020-04-25 MED ORDER — SODIUM CHLORIDE 0.9 % IV SOLN
10.0000 mg | Freq: Once | INTRAVENOUS | Status: AC
  Administered 2020-04-25: 10 mg via INTRAVENOUS
  Filled 2020-04-25: qty 10

## 2020-04-25 NOTE — Progress Notes (Signed)
Nutrition follow-up completed with patient during infusion for tongue cancer.  Patient is receiving concurrent chemoradiation therapy. Patient reports he has lost most taste for food.  He continues to eat well. Weight improved and documented as 205.8 pounds, increased from 202.8 pounds last week. He denies nausea, vomiting, constipation, and diarrhea. Noted labs: Magnesium 1.9, sodium 132, glucose 198, BUN 46, and creatinine 2.03. Continues to flush feeding tube daily with 8 ounces of water.  He has not needed to use his feeding tube for nutrition support.  Estimated nutrition needs: 2200-2500 cal, 120-135 g protein, 2.4 L fluid.  Intervention: Continue strategies for adequate calorie and protein intake to minimize weight loss. Continue free water by mouth and via tube. Oral nutrition supplements/tube feeding if needed to promote weight maintenance.  Monitoring, evaluation, goals: Patient will tolerate adequate calorie and protein intake for weight maintenance.  Next visit: March 14 during infusion.  **Disclaimer: This note was dictated with voice recognition software. Similar sounding words can inadvertently be transcribed and this note may contain transcription errors which may not have been corrected upon publication of note.**

## 2020-04-25 NOTE — Progress Notes (Signed)
[facility] CONSULT NOTE  Patient Care Team: Pickard, Warren T, MD as PCP - General (Family Medicine) [name], Jennifer L, RN as Oncology Nurse Navigator Squire, Sarah, MD as Consulting Physician (Radiation Oncology) Iruku, Praveena, MD as Consulting Physician (Hematology and Oncology) Neff, Barbara L, RD as Dietitian (Nutrition) [name], Carl B, [name] as Speech Language Pathologist (Speech Pathology) Breedlove Blue, Blaire L, PT as Physical Therapist (Physical Therapy) Cunningham, Anne C, [name] as Social Worker (General Practice)  CHIEF COMPLAINTS/PURPOSE OF CONSULTATION:  SCC of oropharynx.  ASSESSMENT & PLAN:  No problem-specific Assessment & Plan notes found for this encounter.  No orders of the defined types were placed in this encounter.  This is a 65-year-old male patient with past medical history significant for diabetes, hypertension, coronary artery disease, stroke, atrial fibrillation on anticoagulation, obesity referred to medical oncology for evaluation and recommendations regarding new diagnosis of squamous cell carcinoma of oropharynx.  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 I have reviewed the current recommendations for stage III HPV positive oropharyngeal cancer with the patient today which is primarily concurrent chemoradiation. Given his comorbidities, age, we considered weekly cisplatin but given his GFR, switched to weekly carboplatin after one cycle of weekly cisplatin at 30 mg/m2. Discussed adverse effects of carboplatin with the patient including but not limited to fatigue, nausea, vomiting, diarrhea, increased risk of infections and very rarely fatal events. He is now post 3 weekly cycles of carboplatin. Clinically responding with decreased lymphadenopathy Ok to proceed with carboplatin today,  Mild borderline thrombocytopenia, Kidney function is stable overall.  2. Insomnia,  he is using 10 mg QHS of ambien. He is not taking restoril I advised that he shouldn't be taking more than 10 of ambien QHS and he cannot take both restoril and ambien Wrote it down for him to make it easier to remember.  3. CKD, pretty much at baseline. He has a nephrologist Dr Goldborough at [facility] I have encouraged him to make an appointment with her to monitor CKD.  4. Drug induced hypomagnesemia. Resolved.  FU scheduled.  Praveena Iruku MD   HISTORY OF PRESENTING ILLNESS:   Brian [name] is a 65 y.o. male who presented two-week history of facial swelling in late November/early December 2021. Subsequently, the patient saw Dr. Teoh, who performed a direct laryngoscopy with biopsy on the date of 02/10/2020. Final pathology revealed: squamous cell carcinoma, P16 positive  He had the following imaging performed  1. Soft tissue neck CT scan performed on 01/28/2020 revealing right glossotonsillar malignancy with bulky bilateral nodal disease. Nodal masses sowed definite extracapsular spread. There was also noted to be incidental advanced cervical spine degeneration with cord compression.  2. PET scan performed on 02/26/2020 revealing a hypermetabolic lesion at the right base of the tongue and right tonsil, consistent with oropharyngeal carcinoma. Carcinoma appeared to be confined to the mucosal surface. There were also noted to bulky bilateral hypermetabolic level II metastatic lymph nodes and smaller inferior hypermetabolic level II metastatic lymph nodes. There was no evidence of metastatic disease in the chest, abdomen, or pelvis. The two small right middle lobe pulmonary nodules present on CT from 2012 were consistent with benign etiology.  Started radiation on 03/14/2020 Chemo delayed by a week due to GFR and infusion issues. Started chemotherapy on 03/21/2020 C2 delayed because of AKI Now switched to weekly carboplatin given ongoing AKI C1 of weekly  carboplatin on 04/04/2020 C2 of weekly carboplatin   on 04/11/2020 C3 of weekly carboplatin on 04/18/2020 C4 due today  Interim History  He is doing well. He denies any nausea, vomiting Weight is stable, nothing tastes good but eating like a horse. No change in breathing No change in bowel habits or urinary habits No new neurological complaints. Hearing well. No neuropathy No pain, maintaining weight well Using ambien for sleep.  REVIEW OF SYSTEMS:   Constitutional: Denies fevers, chills or abnormal night sweats Eyes: Denies blurriness of vision, double vision or watery eyes Ears, nose, mouth, throat, and face: Denies mucositis or sore throat.  He is able to swallow everything Respiratory: Denies cough, dyspnea or wheezes Cardiovascular: Denies palpitation, chest discomfort or lower extremity swelling Gastrointestinal:  Denies nausea, heartburn or change in bowel habits Skin: Denies abnormal skin rashes Lymphatics: Complains of masses in the neck  neurological:Denies numbness, tingling or new weaknesses Behavioral/Psych: Mood is stable, no new changes  All other systems were reviewed with the patient and are negative.  MEDICAL HISTORY:  Past Medical History:  Diagnosis Date  . Arthritis   . Atrial fibrillation ([facility])   . Cardiomyopathy   . Cerebrovascular accident ([facility])    2012  . Diabetes mellitus   . Hyperlipidemia   . Hypertension   . Left-sided weakness   . 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 Matthews, laser therapy OD  . Seizures ([facility])    pt reports this is from low blood sugar    SURGICAL HISTORY: Past Surgical History:  Procedure Laterality Date  . DIRECT LARYNGOSCOPY N/A 02/10/2020   Procedure: DIRECT LARYNGOSCOPY WITH BIOPSY;  Surgeon: Teoh, Su, MD;  Location: [facility];  Service: ENT;  Laterality: N/A;  . Ear surgery as a child    . IR GASTROSTOMY TUBE MOD SED  03/23/2020  . IR IMAGING GUIDED PORT INSERTION  03/09/2020  . TONSILLECTOMY      SOCIAL  HISTORY: Social History   Socioeconomic History  . Marital status: Divorced    Spouse name: Not on file  . Number of children: 2  . Years of education: Not on file  . Highest education level: Not on file  Occupational History  . Occupation: Disabled  Tobacco Use  . Smoking status: Never Smoker  . Smokeless tobacco: Never Used  Vaping Use  . Vaping Use: Never used  Substance and Sexual Activity  . Alcohol use: Yes    Alcohol/week: 0.0 standard drinks    Comment: seldom  . Drug use: Yes    Frequency: 7.0 times per week    Comment: marijauna for arthritis, last 03-13-20  . Sexual activity: Not Currently  Other Topics Concern  . Not on file  Social History Narrative   He has children.   Social Determinants of Health   Financial Resource Strain: Low Risk   . Difficulty of Paying Living Expenses: Not very hard  Food Insecurity: No Food Insecurity  . Worried About Running Out of Food in the Last Year: Never true  . Ran Out of Food in the Last Year: Never true  Transportation Needs: No Transportation Needs  . Lack of Transportation (Medical): No  . Lack of Transportation (Non-Medical): No  Physical Activity: Not on file  Stress: Stress Concern Present  . Feeling of Stress : To some extent  Social Connections: Socially Isolated  . Frequency of Communication with Friends and Family: More than three times a week  . Frequency of Social Gatherings with Friends and Family: More than three times a   week  . Attends Religious Services: Never  . Active Member of Clubs or Organizations: No  . Attends Archivist Meetings: Never  . Marital Status: Divorced  Human resources officer Violence: Not on file    FAMILY HISTORY: Family History  Problem Relation Age of Onset  . Hyperlipidemia Father   . Hypertension Father   . Coronary artery disease Neg Hx        no early CAD.  Marland Kitchen Colon cancer Neg Hx   . Stomach cancer Neg Hx   . Rectal cancer Neg Hx   . Esophageal cancer Neg Hx      ALLERGIES:  is allergic to codeine.  MEDICATIONS:  Current Outpatient Medications  Medication Sig Dispense Refill  . amLODipine (NORVASC) 10 MG tablet Take 1 tablet (10 mg total) by mouth daily. 90 tablet 1  . atorvastatin (LIPITOR) 20 MG tablet TAKE 1 TABLET (20 MG TOTAL) DAILY BY MOUTH. 90 tablet 3  . Blood Glucose Monitoring Suppl (ONETOUCH VERIO IQ SYSTEM) w/Device KIT 1 each by Does not apply route 2 (two) times daily. 1 kit 0  . carvedilol (COREG) 25 MG tablet TAKE 1 TABLET BY MOUTH TWICE A DAY WITH A MEAL (Patient taking differently: Take 25 mg by mouth 2 (two) times daily with a meal.) 180 tablet 3  . chlorthalidone (HYGROTON) 25 MG tablet TAKE 1 TABLET BY MOUTH EVERY DAY 90 tablet 1  . cloNIDine (CATAPRES) 0.1 MG tablet Take 1 tablet (0.1 mg total) by mouth 2 (two) times daily. 60 tablet 3  . Continuous Blood Gluc Receiver (FREESTYLE LIBRE 2 READER) DEVI Use as directed to monitor blood glucose continuously. DX E11.65 1 each 1  . Continuous Blood Gluc Sensor (FREESTYLE LIBRE 2 SENSOR) MISC Use as directed to monitor blood glucose continuously. Change sensor Q 14 days. DX E11.65 2 each 11  . furosemide (LASIX) 20 MG tablet TAKE 1 TABLET (20 MG TOTAL) BY MOUTH DAILY AS NEEDED FOR FLUID. 90 tablet 1  . glucose blood test strip 1 each by Other route as needed for other. Use as instructed 100 each 11  . hydrALAZINE (APRESOLINE) 50 MG tablet TAKE 1 TABLET BY MOUTH THREE TIMES A DAY (Patient taking differently: Take 50 mg by mouth 3 (three) times daily.) 270 tablet 1  . Insulin Pen Needle (B-D UF III MINI PEN NEEDLES) 31G X 5 MM MISC USE DAILY WITH INSULIN 100 each 5  . Insulin Syringe-Needle U-100 (INSULIN SYRINGE .3CC/31GX5/16") 31G X 5/16" 0.3 ML MISC Use twice daily to inject insulin. 100 each 10  . LEVEMIR FLEXTOUCH 100 UNIT/ML FlexPen INJECT 70 UNITS UNDER THE SKIN EVERY MORNING AND 40 UNITS AT BEDTIME 15 mL 3  . lidocaine (XYLOCAINE) 2 % solution Patient: Mix 1part 2% viscous  lidocaine, 1part H20. Swish & swallow 38mL of diluted mixture, 65min before meals and at bedtime, up to QID 200 mL 4  . lidocaine-prilocaine (EMLA) cream Apply to affected area once 30 g 3  . LORazepam (ATIVAN) 0.5 MG tablet Take 1 tablet (0.5 mg total) by mouth every 6 (six) hours as needed (Nausea or vomiting). 30 tablet 0  . losartan (COZAAR) 50 MG tablet Take 1 tablet (50 mg total) by mouth daily. 90 tablet 3  . magnesium oxide (MAG-OX) 400 MG tablet Take 400 mg by mouth daily.    . ondansetron (ZOFRAN) 8 MG tablet Take 1 tablet (8 mg total) by mouth 2 (two) times daily as needed. Start on the third day after cisplatin chemotherapy.  30 tablet 1  . OneTouch Delica Lancets 97W MISC 1 each by Does not apply route 2 (two) times daily. 200 each 11  . pioglitazone (ACTOS) 15 MG tablet Take 1 tablet (15 mg total) by mouth daily. 90 tablet 2  . prochlorperazine (COMPAZINE) 10 MG tablet Take 1 tablet (10 mg total) by mouth every 6 (six) hours as needed (Nausea or vomiting). 30 tablet 1  . temazepam (RESTORIL) 22.5 MG capsule Take 1 capsule (22.5 mg total) by mouth at bedtime as needed for sleep. Do not combine with Ambien. 30 capsule 0  . warfarin (COUMADIN) 10 MG tablet Take 1 tablet (10 mg total) by mouth daily. 90 tablet 3  . zolpidem (AMBIEN) 5 MG tablet Take 1 tablet (5 mg total) by mouth at bedtime as needed for sleep. 30 tablet 0   No current facility-administered medications for this visit.     PHYSICAL EXAMINATION: ECOG PERFORMANCE STATUS: 0 - Asymptomatic  There were no vitals filed for this visit. There were no vitals filed for this visit. Physical Exam Constitutional:      Appearance: Normal appearance.  HENT:     Head: Normocephalic and atraumatic.     Nose: Nose normal.     Mouth/Throat:     Mouth: Mucous membranes are moist.  Cardiovascular:     Rate and Rhythm: Normal rate and regular rhythm.     Pulses: Normal pulses.     Heart sounds: Normal heart sounds.  Pulmonary:      Effort: Pulmonary effort is normal.     Breath sounds: Normal breath sounds.  Abdominal:     General: Abdomen is flat.     Palpations: Abdomen is soft.  Musculoskeletal:        General: No swelling. Normal range of motion.     Cervical back: Normal range of motion and neck supple.  Lymphadenopathy:     Cervical: Cervical adenopathy (not palpable today. No mucositis noted) present.  Skin:    General: Skin is warm and dry.  Neurological:     General: No focal deficit present.     Mental Status: He is alert and oriented to person, place, and time.  Psychiatric:        Mood and Affect: Mood normal.        Behavior: Behavior normal.      LABORATORY DATA:  I have reviewed the data as listed Lab Results  Component Value Date   WBC 3.3 (L) 04/18/2020   HGB 9.9 (L) 04/18/2020   HCT 30.0 (L) 04/18/2020   MCV 83.8 04/18/2020   PLT 120 (L) 04/18/2020     Chemistry      Component Value Date/Time   NA 129 (L) 04/18/2020 0910   K 4.3 04/18/2020 0910   CL 96 (L) 04/18/2020 0910   CO2 24 04/18/2020 0910   BUN 43 (H) 04/18/2020 0910   CREATININE 1.95 (H) 04/18/2020 0910   CREATININE 1.23 11/30/2019 0915      Component Value Date/Time   CALCIUM 8.5 (L) 04/18/2020 0910   ALKPHOS 59 04/18/2020 0910   AST 16 04/18/2020 0910   ALT 17 04/18/2020 0910   BILITOT 0.4 04/18/2020 0910       RADIOGRAPHIC STUDIES: I have personally reviewed the radiological images as listed and agreed with the findings in the report. No results found. I reviewed the image myself.  Noted hypermetabolic lesions at the base of tongue on the right side and right tonsil as well as the bilateral  bulky lymphadenopathy.  02/10/2020  SURGICAL PATHOLOGY    Reason for Addendum #1: Immunohistochemistry results   Clinical History: throat cancer (cm)    FINAL MICROSCOPIC DIAGNOSIS:   A. OROPHARYNGEAL, RIGHT, BIOPSY:  - Squamous cell carcinoma  - See comment   COMMENT:   P16 immunohistochemistry is  pending and will be reported in an addendum.  Dr. Tresa Moore reviewed the case and agrees with the above diagnosis. Dr.  Benjamine Mola was notified of these results on February 11, 2020.   GROSS DESCRIPTION:   The specimen is received in saline and consists of a 1.6 x 1.5 x 0.3 cm  aggregate of tan-pink soft tissue. The specimen is entirely submitted  in 1 cassette. Craig Staggers 02/10/2020)    Final Diagnosis performed by Thressa Sheller, MD.  Electronically signed  02/11/2020  Technical and / or Professional components performed at [facility]. [facility], [place] [address], [place], [place] [address].  Immunohistochemistry Technical component (if applicable) was performed  at [facility]. [address] [place],  [place], [place] [address].  IMMUNOHISTOCHEMISTRY DISCLAIMER (if applicable):  Some of these immunohistochemical stains may have been developed and the  performance characteristics determine by [facility]. Some  may not have been cleared or approved by the U.S. Food and Drug  Administration. The FDA has determined that such clearance or approval  is not necessary. This test is used for clinical purposes. It should not  be regarded as investigational or for research. This laboratory is  certified under the [place]  (CLIA-88) as qualified to perform high complexity clinical laboratory  testing. The controls stained appropriately.   ADDENDUM:   P16 immunohistochemistry is POSITIVE.   Reviewed labs. AKI and low Mg. Will provide Mag replacement. Encouraged oral hydration Will proceed with chemo today.  All questions were answered. The patient knows to call the clinic with any problems, questions or concerns.. I spent 30 minutes in the care of this patient including H and P, review of records, counseling and coordination of care.    [name], MD 04/25/2020 8:38 AM

## 2020-04-25 NOTE — Progress Notes (Signed)
Ok to treat today with current labs per Dr Chryl Heck  Pt discharged in no apparent distress. Pt left ambulatory without assistance.  Pt aware of discharge instructions and verbalized understanding and had no further questions.

## 2020-04-26 ENCOUNTER — Ambulatory Visit
Admission: RE | Admit: 2020-04-26 | Discharge: 2020-04-26 | Disposition: A | Discharge status: Home / Self Care | Payer: Medicare Other | Source: Ambulatory Visit | Attending: Radiation Oncology | Admitting: Radiation Oncology

## 2020-04-26 ENCOUNTER — Other Ambulatory Visit: Payer: Self-pay

## 2020-04-26 DIAGNOSIS — C01 Malignant neoplasm of base of tongue: Secondary | ICD-10-CM | POA: Diagnosis not present

## 2020-04-27 ENCOUNTER — Other Ambulatory Visit: Payer: Self-pay

## 2020-04-27 ENCOUNTER — Ambulatory Visit
Admission: RE | Admit: 2020-04-27 | Discharge: 2020-04-27 | Disposition: A | Discharge status: Home / Self Care | Payer: Medicare Other | Source: Ambulatory Visit | Attending: Radiation Oncology | Admitting: Radiation Oncology

## 2020-04-27 DIAGNOSIS — C01 Malignant neoplasm of base of tongue: Secondary | ICD-10-CM | POA: Diagnosis not present

## 2020-04-28 ENCOUNTER — Other Ambulatory Visit: Payer: Self-pay

## 2020-04-28 ENCOUNTER — Ambulatory Visit
Admission: RE | Admit: 2020-04-28 | Discharge: 2020-04-28 | Disposition: A | Discharge status: Home / Self Care | Payer: Medicare Other | Source: Ambulatory Visit | Attending: Radiation Oncology | Admitting: Radiation Oncology

## 2020-04-28 DIAGNOSIS — C01 Malignant neoplasm of base of tongue: Secondary | ICD-10-CM | POA: Diagnosis not present

## 2020-04-29 ENCOUNTER — Other Ambulatory Visit: Payer: Self-pay

## 2020-04-29 ENCOUNTER — Ambulatory Visit: Payer: Medicare Other

## 2020-04-29 ENCOUNTER — Ambulatory Visit
Admission: RE | Admit: 2020-04-29 | Discharge: 2020-04-29 | Disposition: A | Discharge status: Home / Self Care | Payer: Medicare Other | Source: Ambulatory Visit | Attending: Radiation Oncology | Admitting: Radiation Oncology

## 2020-04-29 DIAGNOSIS — C01 Malignant neoplasm of base of tongue: Secondary | ICD-10-CM | POA: Diagnosis not present

## 2020-05-02 ENCOUNTER — Emergency Department (HOSPITAL_COMMUNITY): Payer: Medicare Other

## 2020-05-02 ENCOUNTER — Encounter: Payer: Self-pay | Admitting: Nutrition

## 2020-05-02 ENCOUNTER — Inpatient Hospital Stay: Payer: Medicare Other | Admitting: Nutrition

## 2020-05-02 ENCOUNTER — Ambulatory Visit: Payer: Medicare Other

## 2020-05-02 ENCOUNTER — Inpatient Hospital Stay: Payer: Medicare Other

## 2020-05-02 ENCOUNTER — Encounter (HOSPITAL_COMMUNITY): Payer: Self-pay

## 2020-05-02 ENCOUNTER — Emergency Department (HOSPITAL_COMMUNITY)
Admission: EM | Admit: 2020-05-02 | Discharge: 2020-05-02 | Disposition: A | Discharge status: Home / Self Care | Payer: Medicare Other | Attending: Emergency Medicine | Admitting: Emergency Medicine

## 2020-05-02 ENCOUNTER — Inpatient Hospital Stay: Payer: Medicare Other | Admitting: Hematology and Oncology

## 2020-05-02 DIAGNOSIS — Z79899 Other long term (current) drug therapy: Secondary | ICD-10-CM | POA: Diagnosis not present

## 2020-05-02 DIAGNOSIS — Z7901 Long term (current) use of anticoagulants: Secondary | ICD-10-CM | POA: Diagnosis not present

## 2020-05-02 DIAGNOSIS — R531 Weakness: Secondary | ICD-10-CM | POA: Diagnosis not present

## 2020-05-02 DIAGNOSIS — Z794 Long term (current) use of insulin: Secondary | ICD-10-CM | POA: Diagnosis not present

## 2020-05-02 DIAGNOSIS — C329 Malignant neoplasm of larynx, unspecified: Secondary | ICD-10-CM | POA: Insufficient documentation

## 2020-05-02 DIAGNOSIS — Z9221 Personal history of antineoplastic chemotherapy: Secondary | ICD-10-CM | POA: Diagnosis not present

## 2020-05-02 DIAGNOSIS — I959 Hypotension, unspecified: Secondary | ICD-10-CM | POA: Diagnosis not present

## 2020-05-02 DIAGNOSIS — A419 Sepsis, unspecified organism: Secondary | ICD-10-CM | POA: Diagnosis not present

## 2020-05-02 DIAGNOSIS — E860 Dehydration: Secondary | ICD-10-CM | POA: Diagnosis not present

## 2020-05-02 DIAGNOSIS — R404 Transient alteration of awareness: Secondary | ICD-10-CM | POA: Diagnosis not present

## 2020-05-02 DIAGNOSIS — R6889 Other general symptoms and signs: Secondary | ICD-10-CM | POA: Diagnosis not present

## 2020-05-02 DIAGNOSIS — E119 Type 2 diabetes mellitus without complications: Secondary | ICD-10-CM | POA: Insufficient documentation

## 2020-05-02 DIAGNOSIS — I10 Essential (primary) hypertension: Secondary | ICD-10-CM | POA: Insufficient documentation

## 2020-05-02 DIAGNOSIS — I4891 Unspecified atrial fibrillation: Secondary | ICD-10-CM | POA: Insufficient documentation

## 2020-05-02 DIAGNOSIS — Z743 Need for continuous supervision: Secondary | ICD-10-CM | POA: Diagnosis not present

## 2020-05-02 DIAGNOSIS — R0689 Other abnormalities of breathing: Secondary | ICD-10-CM | POA: Diagnosis not present

## 2020-05-02 LAB — CBC WITH DIFFERENTIAL/PLATELET
Abs Immature Granulocytes: 0.01 K/uL (ref 0.00–0.07)
Basophils Absolute: 0 K/uL (ref 0.0–0.1)
Basophils Relative: 0 %
Eosinophils Absolute: 0 K/uL (ref 0.0–0.5)
Eosinophils Relative: 1 %
HCT: 27.5 % — ABNORMAL LOW (ref 39.0–52.0)
Hemoglobin: 9 g/dL — ABNORMAL LOW (ref 13.0–17.0)
Immature Granulocytes: 1 %
Lymphocytes Relative: 14 %
Lymphs Abs: 0.3 K/uL — ABNORMAL LOW (ref 0.7–4.0)
MCH: 28.1 pg (ref 26.0–34.0)
MCHC: 32.7 g/dL (ref 30.0–36.0)
MCV: 85.9 fL (ref 80.0–100.0)
Monocytes Absolute: 0.3 K/uL (ref 0.1–1.0)
Monocytes Relative: 11 %
Neutro Abs: 1.6 K/uL — ABNORMAL LOW (ref 1.7–7.7)
Neutrophils Relative %: 73 %
Platelets: 92 K/uL — ABNORMAL LOW (ref 150–400)
RBC: 3.2 MIL/uL — ABNORMAL LOW (ref 4.22–5.81)
RDW: 15 % (ref 11.5–15.5)
WBC: 2.2 K/uL — ABNORMAL LOW (ref 4.0–10.5)
nRBC: 0 % (ref 0.0–0.2)

## 2020-05-02 LAB — COMPREHENSIVE METABOLIC PANEL
ALT: 16 U/L (ref 0–44)
AST: 18 U/L (ref 15–41)
Albumin: 3 g/dL — ABNORMAL LOW (ref 3.5–5.0)
Alkaline Phosphatase: 43 U/L (ref 38–126)
Anion gap: 14 (ref 5–15)
BUN: 53 mg/dL — ABNORMAL HIGH (ref 8–23)
CO2: 20 mmol/L — ABNORMAL LOW (ref 22–32)
Calcium: 8.4 mg/dL — ABNORMAL LOW (ref 8.9–10.3)
Chloride: 100 mmol/L (ref 98–111)
Creatinine, Ser: 1.9 mg/dL — ABNORMAL HIGH (ref 0.61–1.24)
GFR, Estimated: 38 mL/min — ABNORMAL LOW (ref >60)
Glucose, Bld: 150 mg/dL — ABNORMAL HIGH (ref 70–99)
Potassium: 4.3 mmol/L (ref 3.5–5.1)
Sodium: 134 mmol/L — ABNORMAL LOW (ref 135–145)
Total Bilirubin: 0.8 mg/dL (ref 0.3–1.2)
Total Protein: 6.1 g/dL — ABNORMAL LOW (ref 6.5–8.1)

## 2020-05-02 LAB — PROTIME-INR
INR: 2.7 — ABNORMAL HIGH (ref 0.8–1.2)
Prothrombin Time: 27.8 seconds — ABNORMAL HIGH (ref 11.4–15.2)

## 2020-05-02 LAB — APTT: aPTT: 34 s (ref 24–36)

## 2020-05-02 LAB — LACTIC ACID, PLASMA: Lactic Acid, Venous: 1.2 mmol/L (ref 0.5–1.9)

## 2020-05-02 MED ORDER — SODIUM CHLORIDE 0.9 % IV BOLUS (SEPSIS)
2000.0000 mL | Freq: Once | INTRAVENOUS | Status: AC
  Administered 2020-05-02: 2000 mL via INTRAVENOUS

## 2020-05-02 NOTE — Progress Notes (Deleted)
[name] CONSULT NOTE  Patient Care Team: Pickard, Warren T, MD as PCP - General (Family Medicine) [name], Jennifer L, RN as Oncology Nurse Navigator Squire, Sarah, MD as Consulting Physician (Radiation Oncology) Iruku, Praveena, MD as Consulting Physician (Hematology and Oncology) Neff, Barbara L, RD as Dietitian (Nutrition) [name], Carl B, [name] as Speech Language Pathologist (Speech Pathology) Breedlove Blue, Blaire L, PT as Physical Therapist (Physical Therapy) Cunningham, Anne C, [name] as Social Worker (General Practice)  CHIEF COMPLAINTS/PURPOSE OF CONSULTATION:  SCC of oropharynx.  ASSESSMENT & PLAN:  No problem-specific Assessment & Plan notes found for this encounter.  No orders of the defined types were placed in this encounter.  This is a 64-year-old male patient with past medical history significant for diabetes, hypertension, coronary artery disease, stroke, atrial fibrillation on anticoagulation, obesity referred to medical oncology for evaluation and recommendations regarding new diagnosis of squamous cell carcinoma of oropharynx.  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 I have reviewed the current recommendations for stage III HPV positive oropharyngeal cancer with the patient today which is primarily concurrent chemoradiation. Given his comorbidities, age, we considered weekly cisplatin but given his GFR, switched to weekly carboplatin after one cycle of weekly cisplatin at 30 mg/m2. Discussed adverse effects of carboplatin with the patient including but not limited to fatigue, nausea, vomiting, diarrhea, increased risk of infections and very rarely fatal events. He is now post 3 weekly cycles of carboplatin. Clinically responding with decreased lymphadenopathy Ok to proceed with carboplatin today,  Mild borderline thrombocytopenia, Kidney function is stable overall.  2. Insomnia,  he is using 10 mg QHS of ambien. He is not taking restoril I advised that he shouldn't be taking more than 10 of ambien QHS and he cannot take both restoril and ambien Wrote it down for him to make it easier to remember.  3. CKD, pretty much at baseline. He has a nephrologist Dr Goldborough at [facility] I have encouraged him to make an appointment with her to monitor CKD.  4. Drug induced hypomagnesemia. Resolved.  FU scheduled.  Praveena Iruku MD   HISTORY OF PRESENTING ILLNESS:   Brian [name] is a 64 y.o. male who presented two-week history of facial swelling in late November/early December 2021. Subsequently, the patient saw Dr. Teoh, who performed a direct laryngoscopy with biopsy on the date of 02/10/2020. Final pathology revealed: squamous cell carcinoma, P16 positive  He had the following imaging performed  1. Soft tissue neck CT scan performed on 01/28/2020 revealing right glossotonsillar malignancy with bulky bilateral nodal disease. Nodal masses sowed definite extracapsular spread. There was also noted to be incidental advanced cervical spine degeneration with cord compression.  2. PET scan performed on 02/26/2020 revealing a hypermetabolic lesion at the right base of the tongue and right tonsil, consistent with oropharyngeal carcinoma. Carcinoma appeared to be confined to the mucosal surface. There were also noted to bulky bilateral hypermetabolic level II metastatic lymph nodes and smaller inferior hypermetabolic level II metastatic lymph nodes. There was no evidence of metastatic disease in the chest, abdomen, or pelvis. The two small right middle lobe pulmonary nodules present on CT from 2012 were consistent with benign etiology.  Started radiation on 03/14/2020 Chemo delayed by a week due to GFR and infusion issues. Started chemotherapy on 03/21/2020 C2 delayed because of AKI Now switched to weekly carboplatin given ongoing AKI C1 of weekly  carboplatin on 04/04/2020 C2 of weekly carboplatin   on 04/11/2020 C3 of weekly carboplatin on 04/18/2020 C4 due today  Interim History  He is doing well. He denies any nausea, vomiting Weight is stable, nothing tastes good but eating like a horse. No change in breathing No change in bowel habits or urinary habits No new neurological complaints. Hearing well. No neuropathy No pain, maintaining weight well Using ambien for sleep.  REVIEW OF SYSTEMS:   Constitutional: Denies fevers, chills or abnormal night sweats Eyes: Denies blurriness of vision, double vision or watery eyes Ears, nose, mouth, throat, and face: Denies mucositis or sore throat.  He is able to swallow everything Respiratory: Denies cough, dyspnea or wheezes Cardiovascular: Denies palpitation, chest discomfort or lower extremity swelling Gastrointestinal:  Denies nausea, heartburn or change in bowel habits Skin: Denies abnormal skin rashes Lymphatics: Complains of masses in the neck  neurological:Denies numbness, tingling or new weaknesses Behavioral/Psych: Mood is stable, no new changes  All other systems were reviewed with the patient and are negative.  MEDICAL HISTORY:  Past Medical History:  Diagnosis Date  . Arthritis   . Atrial fibrillation ([place])   . Cardiomyopathy   . Cerebrovascular accident ([place])    2012  . Diabetes mellitus   . Hyperlipidemia   . Hypertension   . Left-sided weakness   . 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 Zigmund Daniel, laser therapy OD  . Seizures ([place])    pt reports this is from low blood sugar    SURGICAL HISTORY: Past Surgical History:  Procedure Laterality Date  . DIRECT LARYNGOSCOPY N/A 02/10/2020   Procedure: DIRECT LARYNGOSCOPY WITH BIOPSY;  Surgeon: [name], MD;  Location: [facility];  Service: ENT;  Laterality: N/A;  . Ear surgery as a child    . IR GASTROSTOMY TUBE MOD SED  03/23/2020  . IR IMAGING GUIDED PORT INSERTION  03/09/2020  . TONSILLECTOMY      SOCIAL  HISTORY: Social History   Socioeconomic History  . Marital status: Divorced    Spouse name: Not on file  . Number of children: 2  . Years of education: Not on file  . Highest education level: Not on file  Occupational History  . Occupation: Disabled  Tobacco Use  . Smoking status: Never Smoker  . Smokeless tobacco: Never Used  Vaping Use  . Vaping Use: Never used  Substance and Sexual Activity  . Alcohol use: Yes    Alcohol/week: 0.0 standard drinks    Comment: seldom  . Drug use: Yes    Frequency: 7.0 times per week    Comment: marijauna for arthritis, last 03-13-20  . Sexual activity: Not Currently  Other Topics Concern  . Not on file  Social History Narrative   He has children.   Social Determinants of Health   Financial Resource Strain: Low Risk   . Difficulty of Paying Living Expenses: Not very hard  Food Insecurity: No Food Insecurity  . Worried About Charity fundraiser in the Last Year: Never true  . Ran Out of Food in the Last Year: Never true  Transportation Needs: No Transportation Needs  . Lack of Transportation (Medical): No  . Lack of Transportation (Non-Medical): No  Physical Activity: Not on file  Stress: Stress Concern Present  . Feeling of Stress : To some extent  Social Connections: Socially Isolated  . Frequency of Communication with Friends and Family: More than three times a week  . Frequency of Social Gatherings with Friends and Family: More than three times a  week  . Attends Religious Services: Never  . Active Member of Clubs or Organizations: No  . Attends Club or Organization Meetings: Never  . Marital Status: Divorced  Intimate Partner Violence: Not on file    FAMILY HISTORY: Family History  Problem Relation Age of Onset  . Hyperlipidemia Father   . Hypertension Father   . Coronary artery disease Neg Hx        no early CAD.  . Colon cancer Neg Hx   . Stomach cancer Neg Hx   . Rectal cancer Neg Hx   . Esophageal cancer Neg Hx      ALLERGIES:  is allergic to codeine.  MEDICATIONS:  Current Outpatient Medications  Medication Sig Dispense Refill  . amLODipine (NORVASC) 10 MG tablet Take 1 tablet (10 mg total) by mouth daily. 90 tablet 1  . atorvastatin (LIPITOR) 20 MG tablet TAKE 1 TABLET (20 MG TOTAL) DAILY BY MOUTH. 90 tablet 3  . Blood Glucose Monitoring Suppl (ONETOUCH VERIO IQ SYSTEM) w/Device KIT 1 each by Does not apply route 2 (two) times daily. 1 kit 0  . carvedilol (COREG) 25 MG tablet TAKE 1 TABLET BY MOUTH TWICE A DAY WITH A MEAL (Patient taking differently: Take 25 mg by mouth 2 (two) times daily with a meal.) 180 tablet 3  . chlorthalidone (HYGROTON) 25 MG tablet TAKE 1 TABLET BY MOUTH EVERY DAY 90 tablet 1  . cloNIDine (CATAPRES) 0.1 MG tablet Take 1 tablet (0.1 mg total) by mouth 2 (two) times daily. 60 tablet 3  . Continuous Blood Gluc Receiver (FREESTYLE LIBRE 2 READER) DEVI Use as directed to monitor blood glucose continuously. DX E11.65 1 each 1  . Continuous Blood Gluc Sensor (FREESTYLE LIBRE 2 SENSOR) MISC Use as directed to monitor blood glucose continuously. Change sensor Q 14 days. DX E11.65 2 each 11  . furosemide (LASIX) 20 MG tablet TAKE 1 TABLET (20 MG TOTAL) BY MOUTH DAILY AS NEEDED FOR FLUID. 90 tablet 1  . glucose blood test strip 1 each by Other route as needed for other. Use as instructed 100 each 11  . hydrALAZINE (APRESOLINE) 50 MG tablet TAKE 1 TABLET BY MOUTH THREE TIMES A DAY (Patient taking differently: Take 50 mg by mouth 3 (three) times daily.) 270 tablet 1  . Insulin Pen Needle (B-D UF III MINI PEN NEEDLES) 31G X 5 MM MISC USE DAILY WITH INSULIN 100 each 5  . Insulin Syringe-Needle U-100 (INSULIN SYRINGE .3CC/31GX5/16") 31G X 5/16" 0.3 ML MISC Use twice daily to inject insulin. 100 each 10  . LEVEMIR FLEXTOUCH 100 UNIT/ML FlexPen INJECT 70 UNITS UNDER THE SKIN EVERY MORNING AND 40 UNITS AT BEDTIME 15 mL 3  . lidocaine (XYLOCAINE) 2 % solution Patient: Mix 1part 2% viscous  lidocaine, 1part H20. Swish & swallow 10mL of diluted mixture, 30min before meals and at bedtime, up to QID (Patient not taking: Reported on 04/25/2020) 200 mL 4  . lidocaine-prilocaine (EMLA) cream Apply to affected area once (Patient not taking: Reported on 04/25/2020) 30 g 3  . LORazepam (ATIVAN) 0.5 MG tablet Take 1 tablet (0.5 mg total) by mouth every 6 (six) hours as needed (Nausea or vomiting). 30 tablet 0  . losartan (COZAAR) 50 MG tablet Take 1 tablet (50 mg total) by mouth daily. 90 tablet 3  . magnesium oxide (MAG-OX) 400 MG tablet Take 400 mg by mouth daily.    . ondansetron (ZOFRAN) 8 MG tablet Take 1 tablet (8 mg total) by mouth 2 (two)   times daily as needed. Start on the third day after cisplatin chemotherapy. (Patient not taking: Reported on 04/25/2020) 30 tablet 1  . OneTouch Delica Lancets 33G MISC 1 each by Does not apply route 2 (two) times daily. 200 each 11  . pioglitazone (ACTOS) 15 MG tablet Take 1 tablet (15 mg total) by mouth daily. 90 tablet 2  . prochlorperazine (COMPAZINE) 10 MG tablet Take 1 tablet (10 mg total) by mouth every 6 (six) hours as needed (Nausea or vomiting). (Patient not taking: Reported on 04/25/2020) 30 tablet 1  . temazepam (RESTORIL) 22.5 MG capsule Take 1 capsule (22.5 mg total) by mouth at bedtime as needed for sleep. Do not combine with Ambien. 30 capsule 0  . warfarin (COUMADIN) 10 MG tablet Take 1 tablet (10 mg total) by mouth daily. 90 tablet 3  . zolpidem (AMBIEN) 5 MG tablet Take 1 tablet (5 mg total) by mouth at bedtime as needed for sleep. 30 tablet 0   No current facility-administered medications for this visit.     PHYSICAL EXAMINATION: ECOG PERFORMANCE STATUS: 0 - Asymptomatic  There were no vitals filed for this visit. There were no vitals filed for this visit. Physical Exam Constitutional:      Appearance: Normal appearance.  HENT:     Head: Normocephalic and atraumatic.     Nose: Nose normal.     Mouth/Throat:     Mouth: Mucous  membranes are moist.  Cardiovascular:     Rate and Rhythm: Normal rate and regular rhythm.     Pulses: Normal pulses.     Heart sounds: Normal heart sounds.  Pulmonary:     Effort: Pulmonary effort is normal.     Breath sounds: Normal breath sounds.  Abdominal:     General: Abdomen is flat.     Palpations: Abdomen is soft.  Musculoskeletal:        General: No swelling. Normal range of motion.     Cervical back: Normal range of motion and neck supple.  Lymphadenopathy:     Cervical: Cervical adenopathy (not palpable today. No mucositis noted) present.  Skin:    General: Skin is warm and dry.  Neurological:     General: No focal deficit present.     Mental Status: He is alert and oriented to person, place, and time.  Psychiatric:        Mood and Affect: Mood normal.        Behavior: Behavior normal.      LABORATORY DATA:  I have reviewed the data as listed Lab Results  Component Value Date   WBC 3.2 (L) 04/25/2020   HGB 9.0 (L) 04/25/2020   HCT 27.0 (L) 04/25/2020   MCV 84.1 04/25/2020   PLT 99 (L) 04/25/2020     Chemistry      Component Value Date/Time   NA 132 (L) 04/25/2020 0850   K 4.3 04/25/2020 0850   CL 98 04/25/2020 0850   CO2 26 04/25/2020 0850   BUN 46 (H) 04/25/2020 0850   CREATININE 2.03 (H) 04/25/2020 0850   CREATININE 1.23 11/30/2019 0915      Component Value Date/Time   CALCIUM 8.5 (L) 04/25/2020 0850   ALKPHOS 48 04/25/2020 0850   AST 13 (L) 04/25/2020 0850   ALT 17 04/25/2020 0850   BILITOT 0.5 04/25/2020 0850       RADIOGRAPHIC STUDIES: I have personally reviewed the radiological images as listed and agreed with the findings in the report. No results found. I reviewed   the image myself.  Noted hypermetabolic lesions at the base of tongue on the right side and right tonsil as well as the bilateral bulky lymphadenopathy.  02/10/2020  SURGICAL PATHOLOGY    Reason for Addendum #1: Immunohistochemistry results   Clinical History:  throat cancer (cm)    FINAL MICROSCOPIC DIAGNOSIS:   A. OROPHARYNGEAL, RIGHT, BIOPSY:  - Squamous cell carcinoma  - See comment   COMMENT:   P16 immunohistochemistry is pending and will be reported in an addendum.  Dr. Manny reviewed the case and agrees with the above diagnosis. Dr.  Teoh was notified of these results on February 11, 2020.   GROSS DESCRIPTION:   The specimen is received in saline and consists of a 1.6 x 1.5 x 0.3 cm  aggregate of tan-pink soft tissue. The specimen is entirely submitted  in 1 cassette. (KL 02/10/2020)    Final Diagnosis performed by Dawn Butler, MD.  Electronically signed  02/11/2020  Technical and / or Professional components performed at [facility]. [facility], [address], [place], [place] [address].  Immunohistochemistry Technical component (if applicable) was performed  at [facility]. [address], [address],  [place], [place] [address].  IMMUNOHISTOCHEMISTRY DISCLAIMER (if applicable):  Some of these immunohistochemical stains may have been developed and the  performance characteristics determine by [facility]. Some  may not have been cleared or approved by the U.S. Food and Drug  Administration. The FDA has determined that such clearance or approval  is not necessary. This test is used for clinical purposes. It should not  be regarded as investigational or for research. This laboratory is  certified under the Clinical Laboratory Improvement Amendments of 1988  (CLIA-88) as qualified to perform high complexity clinical laboratory  testing. The controls stained appropriately.   ADDENDUM:   P16 immunohistochemistry is POSITIVE.   Reviewed labs. AKI and low Mg. Will provide Mag replacement. Encouraged oral hydration Will proceed with chemo today.  All questions were answered. The patient knows to call the clinic with any problems, questions or concerns.. I spent 30 minutes in the  care of this patient including H and P, review of records, counseling and coordination of care.    Praveena Iruku, MD 05/02/2020 8:07 AM 

## 2020-05-02 NOTE — Progress Notes (Signed)
Patient was in the ED so did not attend nutrition follow up. Will reschedule as needed.

## 2020-05-02 NOTE — Progress Notes (Signed)
TOC CM/CSW received a call from pts RN.  RN notified CSW that pt cld find his own ride and left hospital once he was dc'd.  Brian [name], MSW, [name]-A Pronouns:  She, Her, Hers                  Brian [name] ED Transitions of CareClinical Social Worker [email or web address] [phone]

## 2020-05-02 NOTE — ED Triage Notes (Signed)
Pt coming from a restaurant,Staff noted that he was pale and did not look well. Felt like he was going to pass out. Hypotensive,Today is a radiation tx day here at [facility].

## 2020-05-02 NOTE — Discharge Instructions (Addendum)
Drink plenty of fluids.  Return if any problems.  Your oncologist will contact you to set up a new time for your treatment.  If you do not hear from them by Wednesday you should call them yourself

## 2020-05-02 NOTE — ED Provider Notes (Signed)
[facility] DEPT Provider Note   CSN: [id number] Arrival date & time: 05/02/20  0753     History Chief Complaint  Patient presents with  . Hypotension    Brian [name] is a 65 y.o. male.  Patient is getting chemo and radiation therapy for throat cancer.  He was feeling weak in a restaurant and felt like he was going to pass out.  When paramedics arrived he was mildly hypotensive.  The history is provided by the patient. No language interpreter was used.  Weakness Severity:  Moderate Onset quality:  Sudden Timing:  Constant Progression:  Waxing and waning Chronicity:  New Context: not alcohol use   Relieved by:  Nothing Worsened by:  Nothing Ineffective treatments:  None tried Associated symptoms: no abdominal pain, no chest pain, no cough, no diarrhea, no frequency, no headaches and no seizures        Past Medical History:  Diagnosis Date  . Arthritis   . Atrial fibrillation ([place])   . Cardiomyopathy   . Cerebrovascular accident ([place])    2012  . Diabetes mellitus   . Hyperlipidemia   . Hypertension   . Left-sided weakness   . 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 Zigmund Daniel, laser therapy OD  . Seizures ([place])    pt reports this is from low blood sugar    Patient Active Problem List   Diagnosis Date Noted  . Malignant neoplasm of base of tongue ([place]) 03/02/2020  . 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 Status epilepticus ([place]) 01/18/2013  . 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 status epilepticus, not intractable ([place]) 01/15/2013  . Hyperlipidemia   . Long term current use of anticoagulant 03/11/2010  . Diabetes mellitus, type II ([place]) 12/20/2009  . Essential hypertension, benign 12/20/2009  . Atrial fibrillation ([place]) 12/20/2009  . CONGESTIVE HEART FAILURE, SYSTOLIC DYSFUNCTION 50/10/3816  . CEREBROVASCULAR ACCIDENT 12/20/2009    Past Surgical History:  Procedure Laterality Date  . DIRECT LARYNGOSCOPY N/A 02/10/2020    Procedure: DIRECT LARYNGOSCOPY WITH BIOPSY;  Surgeon: [name], MD;  Location: [facility];  Service: ENT;  Laterality: N/A;  . Ear surgery as a child    . IR GASTROSTOMY TUBE MOD SED  03/23/2020  . IR IMAGING GUIDED PORT INSERTION  03/09/2020  . TONSILLECTOMY         Family History  Problem Relation Age of Onset  . Hyperlipidemia Father   . Hypertension Father   . Coronary artery disease Neg Hx        no early CAD.  Marland Kitchen Colon cancer Neg Hx   . Stomach cancer Neg Hx   . Rectal cancer Neg Hx   . Esophageal cancer Neg Hx     Social History   Tobacco Use  . Smoking status: Never Smoker  . Smokeless tobacco: Never Used  Vaping Use  . Vaping Use: Never used  Substance Use Topics  . Alcohol use: Yes    Alcohol/week: 0.0 standard drinks    Comment: seldom  . Drug use: Yes    Frequency: 7.0 times per week    Comment: marijauna for arthritis, last 03-13-20    Home Medications Prior to Admission medications   Medication Sig Start Date End Date Taking? Authorizing Provider  amLODipine (NORVASC) 10 MG tablet Take 1 tablet (10 mg total) by mouth daily. 07/21/19   [name], MD  atorvastatin (LIPITOR) 20 MG tablet TAKE 1 TABLET (20 MG TOTAL) DAILY BY MOUTH. 02/22/20  [name], MD  Blood Glucose Monitoring Suppl (ONETOUCH VERIO IQ SYSTEM) w/Device KIT 1 each by Does not apply route 2 (two) times daily. 03/10/20   [name], MD  carvedilol (COREG) 25 MG tablet TAKE 1 TABLET BY MOUTH TWICE A DAY WITH A MEAL Patient taking differently: Take 25 mg by mouth 2 (two) times daily with a meal. 01/28/20   [name], MD  chlorthalidone (HYGROTON) 25 MG tablet TAKE 1 TABLET BY MOUTH EVERY DAY 03/25/20   [name], MD  cloNIDine (CATAPRES) 0.1 MG tablet Take 1 tablet (0.1 mg total) by mouth 2 (two) times daily. 03/10/20   [name], MD  Continuous Blood Gluc Receiver (FREESTYLE LIBRE 2 READER) DEVI Use as directed to monitor blood glucose continuously. DX E11.65  04/11/20   [name], MD  Continuous Blood Gluc Sensor (FREESTYLE LIBRE 2 SENSOR) MISC Use as directed to monitor blood glucose continuously. Change sensor Q 14 days. DX E11.65 04/11/20   [name], MD  furosemide (LASIX) 20 MG tablet TAKE 1 TABLET (20 MG TOTAL) BY MOUTH DAILY AS NEEDED FOR FLUID. 03/24/19   [name], MD  glucose blood test strip 1 each by Other route as needed for other. Use as instructed 03/10/20   [name], MD  hydrALAZINE (APRESOLINE) 50 MG tablet TAKE 1 TABLET BY MOUTH THREE TIMES A DAY Patient taking differently: Take 50 mg by mouth 3 (three) times daily. 05/25/19   [name], MD  Insulin Pen Needle (B-D UF III MINI PEN NEEDLES) 31G X 5 MM MISC USE DAILY WITH INSULIN 04/10/19   [name], MD  Insulin Syringe-Needle U-100 (INSULIN SYRINGE .3CC/31GX5/16") 31G X 5/16" 0.3 ML MISC Use twice daily to inject insulin. 01/10/15   [name], MD  LEVEMIR FLEXTOUCH 100 UNIT/ML FlexPen INJECT 70 UNITS UNDER THE SKIN EVERY MORNING AND 40 UNITS AT BEDTIME 02/22/20   [name], MD  lidocaine (XYLOCAINE) 2 % solution Patient: Mix 1part 2% viscous lidocaine, 1part H20. Swish & swallow 19m of diluted mixture, 329m before meals and at bedtime, up to QID Patient not taking: Reported on 04/25/2020 03/21/20   SqEppie GibsonMD  lidocaine-prilocaine (EMLA) cream Apply to affected area once Patient not taking: Reported on 04/25/2020 03/05/20   IrBenay PikeMD  LORazepam (ATIVAN) 0.5 MG tablet Take 1 tablet (0.5 mg total) by mouth every 6 (six) hours as needed (Nausea or vomiting). 03/05/20   IrBenay PikeMD  losartan (COZAAR) 50 MG tablet Take 1 tablet (50 mg total) by mouth daily. 09/14/19   PiSusy FrizzleMD  magnesium oxide (MAG-OX) 400 MG tablet Take 400 mg by mouth daily.    [provider]  ondansetron (ZOFRAN) 8 MG tablet Take 1 tablet (8 mg total) by mouth 2 (two) times daily as needed. Start on the third day after  cisplatin chemotherapy. Patient not taking: Reported on 04/25/2020 03/05/20   IrBenay PikeMD  OneTouch Delica Lancets 3324OISC 1 each by Does not apply route 2 (two) times daily. 03/10/20   PiSusy FrizzleMD  pioglitazone (ACTOS) 15 MG tablet Take 1 tablet (15 mg total) by mouth daily. 12/01/19   PiSusy FrizzleMD  prochlorperazine (COMPAZINE) 10 MG tablet Take 1 tablet (10 mg total) by mouth every 6 (six) hours as needed (Nausea or vomiting). Patient not taking: Reported on 04/25/2020 03/05/20   IrBenay PikeMD  temazepam (RESTORIL) 22.5 MG capsule  Take 1 capsule (22.5 mg total) by mouth at bedtime as needed for sleep. Do not combine with Ambien. 04/20/20   [name], MD  warfarin (COUMADIN) 10 MG tablet Take 1 tablet (10 mg total) by mouth daily. 07/01/19   [name], MD  zolpidem (AMBIEN) 5 MG tablet Take 1 tablet (5 mg total) by mouth at bedtime as needed for sleep. 04/18/20   [name], MD    Allergies    Codeine  Review of Systems   Review of Systems  Constitutional: Negative for appetite change and fatigue.  HENT: Negative for congestion, ear discharge and sinus pressure.   Eyes: Negative for discharge.  Respiratory: Negative for cough.   Cardiovascular: Negative for chest pain.  Gastrointestinal: Negative for abdominal pain and diarrhea.  Genitourinary: Negative for frequency and hematuria.  Musculoskeletal: Negative for back pain.  Skin: Negative for rash.  Neurological: Positive for weakness. Negative for seizures and headaches.  Psychiatric/Behavioral: Negative for hallucinations.    Physical Exam Updated Vital Signs BP 109/61   Pulse 63   Temp 97.6 F (36.4 C) (Oral)   Resp 12   SpO2 98%   Physical Exam Vitals and nursing note reviewed.  Constitutional:      Appearance: He is well-developed.  HENT:     Head: Normocephalic.     Nose: Nose normal.  Eyes:     General: No scleral icterus.    Conjunctiva/sclera: Conjunctivae normal.   Neck:     Thyroid: No thyromegaly.  Cardiovascular:     Rate and Rhythm: Normal rate and regular rhythm.     Heart sounds: No murmur heard. No friction rub. No gallop.   Pulmonary:     Breath sounds: No stridor. No wheezing or rales.  Chest:     Chest wall: No tenderness.  Abdominal:     General: There is no distension.     Tenderness: There is no abdominal tenderness. There is no rebound.  Musculoskeletal:        General: Normal range of motion.     Cervical back: Neck supple.  Lymphadenopathy:     Cervical: No cervical adenopathy.  Skin:    Findings: No erythema or rash.  Neurological:     Mental Status: He is alert and oriented to person, place, and time.     Motor: No abnormal muscle tone.     Coordination: Coordination normal.  Psychiatric:        Behavior: Behavior normal.     ED Results / Procedures / Treatments   Labs (all labs ordered are listed, but only abnormal results are displayed) Labs Reviewed  COMPREHENSIVE METABOLIC PANEL - Abnormal; Notable for the following components:      Result Value   Sodium 134 (*)    CO2 20 (*)    Glucose, Bld 150 (*)    BUN 53 (*)    Creatinine, Ser 1.90 (*)    Calcium 8.4 (*)    Total Protein 6.1 (*)    Albumin 3.0 (*)    GFR, Estimated 38 (*)    All other components within normal limits  CBC WITH DIFFERENTIAL/PLATELET - Abnormal; Notable for the following components:   WBC 2.2 (*)    RBC 3.20 (*)    Hemoglobin 9.0 (*)    HCT 27.5 (*)    Platelets 92 (*)    Neutro Abs 1.6 (*)    Lymphs Abs 0.3 (*)    All other components within normal limits  PROTIME-INR -  Abnormal; Notable for the following components:   Prothrombin Time 27.8 (*)    INR 2.7 (*)    All other components within normal limits  CULTURE, BLOOD (SINGLE)  URINE CULTURE  LACTIC ACID, PLASMA  APTT  LACTIC ACID, PLASMA  URINALYSIS, ROUTINE W REFLEX MICROSCOPIC    EKG None  Radiology DG Chest Port 1 View  Result Date: 05/02/2020 CLINICAL DATA:   Sepsis. EXAM: PORTABLE CHEST 1 VIEW COMPARISON:  January 20, 2019. FINDINGS: Stable cardiomediastinal silhouette. No pneumothorax or pleural effusion is noted. Interval placement of right internal jugular Port-A-Cath with distal tip in expected position of the SVC. Both lungs are clear. The visualized skeletal structures are unremarkable. IMPRESSION: No active disease. Electronically Signed   By: Marijo Conception M.D.   On: 05/02/2020 08:56    Procedures Procedures   Medications Ordered in ED Medications  sodium chloride 0.9 % bolus 2,000 mL (2,000 mLs Intravenous New Bag/Given 05/02/20 9201)    ED Course  I have reviewed the triage vital signs and the nursing notes.  Pertinent labs & imaging results that were available during my care of the patient were reviewed by me and considered in my medical decision making (see chart for details).    MDM Rules/Calculators/A&P                          Patient with hypotension and dehydration.  Patient responded to normal saline bolus of 2 L.  I spoke with oncology and they stated that they will get in touch with him soon to set up his last chemotherapy which he was supposed to get today.  Patient was notified of this Final Clinical Impression(s) / ED Diagnoses Final diagnoses:  Dehydration    Rx / DC Orders ED Discharge Orders    None       Milton Ferguson, MD 05/02/20 1054

## 2020-05-03 ENCOUNTER — Ambulatory Visit
Admission: RE | Admit: 2020-05-03 | Discharge: 2020-05-03 | Disposition: A | Discharge status: Home / Self Care | Payer: Medicare Other | Source: Ambulatory Visit | Attending: Radiation Oncology | Admitting: Radiation Oncology

## 2020-05-03 ENCOUNTER — Telehealth (HOSPITAL_BASED_OUTPATIENT_CLINIC_OR_DEPARTMENT_OTHER): Payer: Self-pay | Admitting: Emergency Medicine

## 2020-05-03 ENCOUNTER — Ambulatory Visit: Payer: Medicare Other

## 2020-05-03 DIAGNOSIS — C01 Malignant neoplasm of base of tongue: Secondary | ICD-10-CM | POA: Diagnosis not present

## 2020-05-03 LAB — BLOOD CULTURE ID PANEL (REFLEXED) - BCID2

## 2020-05-03 NOTE — Telephone Encounter (Signed)
Positive blood culture results received from lab. Consulted with Dr. Florina Ou. Results are contaminated specimen and pt has scheduled follow up with oncology. No further action needed at this time per Dr. Florina Ou.

## 2020-05-04 ENCOUNTER — Other Ambulatory Visit: Payer: Self-pay | Admitting: Hematology and Oncology

## 2020-05-04 ENCOUNTER — Ambulatory Visit
Admission: RE | Admit: 2020-05-04 | Discharge: 2020-05-04 | Disposition: A | Discharge status: Home / Self Care | Payer: Medicare Other | Source: Ambulatory Visit | Attending: Radiation Oncology | Admitting: Radiation Oncology

## 2020-05-04 ENCOUNTER — Encounter: Payer: Self-pay | Admitting: Radiation Oncology

## 2020-05-04 DIAGNOSIS — C01 Malignant neoplasm of base of tongue: Secondary | ICD-10-CM | POA: Diagnosis not present

## 2020-05-04 NOTE — Progress Notes (Signed)
Oncology Nurse Navigator Documentation  Met with Brian [name] before his final RT to offer support and to celebrate end of radiation treatment.   Provided verbal/written post-RT guidance:  Importance of keeping all follow-up appts, especially those with Nutrition and SLP.  Importance of protecting treatment area from sun.  Continuation of Sonafine application 2-3 times daily, application of antibiotic ointment to areas of raw skin; when supply of Sonafine exhausted transition to OTC lotion with vitamin E. Provided/reviewed Epic calendar of upcoming appts. Explained my role as navigator will continue for several more months, encouraged him to call me with needs/concerns.    Harlow Asa RN, BSN, OCN Head & Neck Oncology Nurse [place] at [facility] Phone # [phone]  Fax # [phone]

## 2020-05-05 ENCOUNTER — Ambulatory Visit: Payer: Medicare Other | Admitting: Family Medicine

## 2020-05-05 ENCOUNTER — Other Ambulatory Visit: Payer: Self-pay | Admitting: Family Medicine

## 2020-05-05 LAB — CULTURE, BLOOD (SINGLE)

## 2020-05-06 ENCOUNTER — Telehealth: Payer: Self-pay

## 2020-05-06 NOTE — Progress Notes (Signed)
Oncology Nurse Navigator Documentation  I called and left a voice mail with Brian [name] just now to remind him of his appointments at [facility] on 3/21 starting at 8:15 for labs, port flush, and then Dr. Chryl Heck. I provided him with my direct contact number if he had any questions to contact me.   Harlow Asa RN, BSN, OCN Head & Neck Oncology Nurse [place] at [facility] Phone # [phone]  Fax # [phone]

## 2020-05-06 NOTE — Telephone Encounter (Signed)
+   BC report which  [place] reviewed and no F?U needed per [facility]

## 2020-05-09 ENCOUNTER — Telehealth: Payer: Self-pay

## 2020-05-09 ENCOUNTER — Other Ambulatory Visit: Payer: Self-pay

## 2020-05-09 ENCOUNTER — Encounter: Payer: Self-pay | Admitting: Hematology and Oncology

## 2020-05-09 ENCOUNTER — Inpatient Hospital Stay (HOSPITAL_BASED_OUTPATIENT_CLINIC_OR_DEPARTMENT_OTHER): Payer: Medicare Other | Admitting: Hematology and Oncology

## 2020-05-09 ENCOUNTER — Ambulatory Visit: Payer: Medicare Other | Admitting: Family Medicine

## 2020-05-09 ENCOUNTER — Inpatient Hospital Stay: Payer: Medicare Other

## 2020-05-09 ENCOUNTER — Inpatient Hospital Stay: Payer: Medicare Other | Admitting: Nutrition

## 2020-05-09 VITALS — BP 105/68 | HR 66 | Temp 97.7°F | Resp 15 | Ht 69.0 in | Wt 196.8 lb

## 2020-05-09 DIAGNOSIS — G4701 Insomnia due to medical condition: Secondary | ICD-10-CM

## 2020-05-09 DIAGNOSIS — E1122 Type 2 diabetes mellitus with diabetic chronic kidney disease: Secondary | ICD-10-CM | POA: Diagnosis not present

## 2020-05-09 DIAGNOSIS — F19982 Other psychoactive substance use, unspecified with psychoactive substance-induced sleep disorder: Secondary | ICD-10-CM | POA: Diagnosis not present

## 2020-05-09 DIAGNOSIS — C109 Malignant neoplasm of oropharynx, unspecified: Secondary | ICD-10-CM | POA: Diagnosis not present

## 2020-05-09 DIAGNOSIS — R197 Diarrhea, unspecified: Secondary | ICD-10-CM | POA: Diagnosis not present

## 2020-05-09 DIAGNOSIS — C01 Malignant neoplasm of base of tongue: Secondary | ICD-10-CM

## 2020-05-09 DIAGNOSIS — T451X5A Adverse effect of antineoplastic and immunosuppressive drugs, initial encounter: Secondary | ICD-10-CM | POA: Insufficient documentation

## 2020-05-09 DIAGNOSIS — Z95828 Presence of other vascular implants and grafts: Secondary | ICD-10-CM

## 2020-05-09 DIAGNOSIS — K521 Toxic gastroenteritis and colitis: Secondary | ICD-10-CM

## 2020-05-09 DIAGNOSIS — N189 Chronic kidney disease, unspecified: Secondary | ICD-10-CM | POA: Diagnosis not present

## 2020-05-09 DIAGNOSIS — Z5111 Encounter for antineoplastic chemotherapy: Secondary | ICD-10-CM | POA: Diagnosis not present

## 2020-05-09 DIAGNOSIS — G4700 Insomnia, unspecified: Secondary | ICD-10-CM | POA: Insufficient documentation

## 2020-05-09 DIAGNOSIS — Z452 Encounter for adjustment and management of vascular access device: Secondary | ICD-10-CM | POA: Diagnosis not present

## 2020-05-09 DIAGNOSIS — I129 Hypertensive chronic kidney disease with stage 1 through stage 4 chronic kidney disease, or unspecified chronic kidney disease: Secondary | ICD-10-CM | POA: Diagnosis not present

## 2020-05-09 DIAGNOSIS — N183 Chronic kidney disease, stage 3 unspecified: Secondary | ICD-10-CM | POA: Diagnosis not present

## 2020-05-09 DIAGNOSIS — I4891 Unspecified atrial fibrillation: Secondary | ICD-10-CM | POA: Diagnosis not present

## 2020-05-09 LAB — CBC WITH DIFFERENTIAL/PLATELET
Abs Immature Granulocytes: 0.01 10*3/uL (ref 0.00–0.07)
Basophils Absolute: 0 10*3/uL (ref 0.0–0.1)
Basophils Relative: 0 %
Eosinophils Absolute: 0 10*3/uL (ref 0.0–0.5)
Eosinophils Relative: 1 %
HCT: 24.9 % — ABNORMAL LOW (ref 39.0–52.0)
Hemoglobin: 8.4 g/dL — ABNORMAL LOW (ref 13.0–17.0)
Immature Granulocytes: 1 %
Lymphocytes Relative: 18 %
Lymphs Abs: 0.4 10*3/uL — ABNORMAL LOW (ref 0.7–4.0)
MCH: 28.7 pg (ref 26.0–34.0)
MCHC: 33.7 g/dL (ref 30.0–36.0)
MCV: 85 fL (ref 80.0–100.0)
Monocytes Absolute: 0.3 10*3/uL (ref 0.1–1.0)
Monocytes Relative: 12 %
Neutro Abs: 1.4 10*3/uL — ABNORMAL LOW (ref 1.7–7.7)
Neutrophils Relative %: 68 %
Platelets: 83 10*3/uL — ABNORMAL LOW (ref 150–400)
RBC: 2.93 MIL/uL — ABNORMAL LOW (ref 4.22–5.81)
RDW: 16 % — ABNORMAL HIGH (ref 11.5–15.5)
WBC: 2.1 10*3/uL — ABNORMAL LOW (ref 4.0–10.5)
nRBC: 0 % (ref 0.0–0.2)

## 2020-05-09 LAB — CMP (CANCER CENTER ONLY)
ALT: 14 U/L (ref 0–44)
AST: 10 U/L — ABNORMAL LOW (ref 15–41)
Albumin: 2.8 g/dL — ABNORMAL LOW (ref 3.5–5.0)
Alkaline Phosphatase: 51 U/L (ref 38–126)
Anion gap: 8 (ref 5–15)
BUN: 59 mg/dL — ABNORMAL HIGH (ref 8–23)
CO2: 24 mmol/L (ref 22–32)
Calcium: 8.7 mg/dL — ABNORMAL LOW (ref 8.9–10.3)
Chloride: 96 mmol/L — ABNORMAL LOW (ref 98–111)
Creatinine: 2.33 mg/dL — ABNORMAL HIGH (ref 0.61–1.24)
GFR, Estimated: 30 mL/min — ABNORMAL LOW (ref >60)
Glucose, Bld: 335 mg/dL — ABNORMAL HIGH (ref 70–99)
Potassium: 4.3 mmol/L (ref 3.5–5.1)
Sodium: 128 mmol/L — ABNORMAL LOW (ref 135–145)
Total Bilirubin: 0.4 mg/dL (ref 0.3–1.2)
Total Protein: 6.2 g/dL — ABNORMAL LOW (ref 6.5–8.1)

## 2020-05-09 LAB — MAGNESIUM: Magnesium: 1.6 mg/dL — ABNORMAL LOW (ref 1.7–2.4)

## 2020-05-09 MED ORDER — HEPARIN SOD (PORK) LOCK FLUSH 100 UNIT/ML IV SOLN
500.0000 [IU] | Freq: Once | INTRAVENOUS | Status: AC
  Administered 2020-05-09: 500 [IU] via INTRAVENOUS
  Filled 2020-05-09: qty 5

## 2020-05-09 MED ORDER — SODIUM CHLORIDE 0.9% FLUSH
10.0000 mL | INTRAVENOUS | Status: DC | PRN
  Administered 2020-05-09: 10 mL via INTRAVENOUS
  Filled 2020-05-09: qty 10

## 2020-05-09 MED ORDER — MAGNESIUM SULFATE 2 GM/50ML IV SOLN: 2.0000 g | Freq: Once | INTRAVENOUS | Status: DC

## 2020-05-09 MED ORDER — SODIUM CHLORIDE 0.9 % IV SOLN
Freq: Once | INTRAVENOUS | Status: DC
  Filled 2020-05-09: qty 250

## 2020-05-09 NOTE — Telephone Encounter (Signed)
Patient informed of his CMP and magnesium levels from today's labs. Shared Dr. Rob Hickman recommendations for patient to receive IVF and magnesium. Patient agreeable to receiving treatment. Scheduling message sent and patient to be notified with new appointment.

## 2020-05-09 NOTE — Progress Notes (Signed)
Per Dr. Chryl Heck - patient will not be receiving treatment today. Patient's port deaccessed and patient assisted to valet via wheelchair. Patient stable upon discharge with no complaints.

## 2020-05-09 NOTE — Assessment & Plan Note (Addendum)
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 He initially received one weekly cycle of cisplatin, but given GFR issues, transitioned to weekly carboplatin. He now has received 4 weekly cycles of carboplatin, tolerated it well overall. He missed last week's chemo because of hypotension and ED visit. Today his labs still show neutropenia and thrombocytopenia, so we will omit chemo I recommended that he continue to FU with [name].

## 2020-05-09 NOTE — Patient Instructions (Signed)

## 2020-05-09 NOTE — Telephone Encounter (Signed)
Patient notified of scheduled infusion appointment for tomorrow, 3/22 at 8:30 for IVF and magnesium. Patient verbalized an understanding and had no additional questions or concerns.

## 2020-05-09 NOTE — Assessment & Plan Note (Signed)
Grade 1, once or twice a day He doesn't feel that he needs to take imodium for diarrhea. He was again encouraged to call us if it gets worse. He will continue oral hydration in the interim

## 2020-05-09 NOTE — Telephone Encounter (Signed)
-----   Message from [name], MD sent at 05/09/2020 11:28 AM EDT ----- Brian [name]  His CMP and mag were not back when he left Can you call him and tell him that I recommend IVF and IV magnesium  1 L NS over an hr 2 gms of Mag  If he agrees, feel free to place the orders and send a message to scheduling to schedule him.  Thanks,

## 2020-05-09 NOTE — Progress Notes (Signed)
[place] NOTE  Patient Care Team: [name], MD as PCP - General (Family Medicine) [name], [name], RN as Oncology Nurse Navigator [name], MD as Consulting Physician (Radiation Oncology) [name], MD as Consulting Physician (Hematology and Oncology) [name], RD as Dietitian (Nutrition) [name], [name], [name] as Speech Language Pathologist (Speech Pathology) [name], [name], PT as Physical Therapist (Physical Therapy) Justice Britain as Social Worker (General Practice)  CHIEF COMPLAINTS/PURPOSE OF CONSULTATION:  SCC of oropharynx.  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 He initially received one weekly cycle of cisplatin, but given GFR issues, transitioned to weekly carboplatin. He now has received 4 weekly cycles of carboplatin, tolerated it well overall. He missed last week's chemo because of hypotension and ED visit. Today his labs still show neutropenia and thrombocytopenia, so we will omit chemo I recommended that he continue to FU with [name].  Insomnia he is using 10 mg QHS of ambien. He is not taking restoril Overall he is able to sleep decently  CKD (chronic kidney disease) Again, BUN and creatinine worse. Encouraged hydration. Offered IV fluids but patient didn't want them since he is able to drink and eat ok. He was recommended to FU with Dr Moshe Cipro and he acknowledged  Chemotherapy induced diarrhea Grade 1, once or twice a day He doesn't feel that he needs to take imodium for diarrhea. He was again encouraged to call us if it gets worse. He will continue oral hydration in the  interim  No orders of the defined types were placed in this encounter.   [name] MD   HISTORY OF PRESENTING ILLNESS:   Brian [name] is a 65 y.o. male who presented two-week history of facial swelling in late November/early December 2021. Subsequently, the patient saw Dr. Benjamine Mola, who performed a direct laryngoscopy with biopsy on the date of 02/10/2020. Final pathology revealed: squamous cell carcinoma, P16 positive  He had the following imaging performed  1. Soft tissue neck CT scan performed on 01/28/2020 revealing right glossotonsillar malignancy with bulky bilateral nodal disease. Nodal masses sowed definite extracapsular spread. There was also noted to be incidental advanced cervical spine degeneration with cord compression.  2. PET scan performed on 02/26/2020 revealing a hypermetabolic lesion at the right base of the tongue and right tonsil, consistent with oropharyngeal carcinoma. Carcinoma appeared to be confined to the mucosal surface. There were also noted to bulky bilateral hypermetabolic level II metastatic lymph nodes and smaller inferior hypermetabolic level II metastatic lymph nodes. There was no evidence of metastatic disease in the chest, abdomen, or pelvis. The two small right middle lobe pulmonary nodules present on CT from 2012 were consistent with benign etiology.  Started radiation on 03/14/2020 Chemo delayed by a week due to GFR and infusion issues. Started chemotherapy on 03/21/2020 C2 delayed because of AKI Now switched to weekly carboplatin given ongoing AKI C1 of weekly carboplatin on 04/04/2020 C2 of weekly carboplatin on 04/11/2020 C3 of weekly carboplatin on 04/18/2020 C4 due today  Interim History  He is here for follow-up for his anticipated week 5 of carboplatin.  He is very tired, and wheelchair.  He still does not admit to any pain.  He  is able to eat everything.  He has lost about 10 pounds since his last visit but refuses to use the G-tube.  I  have encouraged him to try some energy drinks at least twice a day in addition to his meals.  He admits to drinking a lot of water.  He has had some diarrhea about once or twice a day, does not feel like he needs to take any Imodium. No fevers or chills.  No changes in breathing.  He is still able to drive and take himself for treatment. No change in urinary habits. Rest of the pertinent 10 point ROS reviewed and negative.  REVIEW OF SYSTEMS:   Constitutional: Denies fevers, chills or abnormal night sweats Eyes: Denies blurriness of vision, double vision or watery eyes Ears, nose, mouth, throat, and face: Denies mucositis or sore throat.  He is able to swallow everything Respiratory: Denies cough, dyspnea or wheezes Cardiovascular: Denies palpitation, chest discomfort or lower extremity swelling Gastrointestinal:  Denies nausea, heartburn or change in bowel habits Skin: Denies abnormal skin rashes Lymphatics: Complains of masses in the neck  neurological:Denies numbness, tingling or new weaknesses Behavioral/Psych: Mood is stable, no new changes  All other systems were reviewed with the patient and are negative.  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 Zigmund Daniel, laser therapy OD   Seizures ([place])    pt reports this is from low blood sugar    SURGICAL HISTORY: Past Surgical History:  Procedure Laterality Date   DIRECT LARYNGOSCOPY N/A 02/10/2020   Procedure: DIRECT LARYNGOSCOPY WITH BIOPSY;  Surgeon: [name], MD;  Location: [facility];  Service: ENT;  Laterality: N/A;   Ear surgery as a child     IR GASTROSTOMY TUBE MOD SED  03/23/2020   IR IMAGING GUIDED PORT INSERTION  03/09/2020   TONSILLECTOMY      SOCIAL  HISTORY: Social History   Socioeconomic History   Marital status: Divorced    Spouse name: Not on file   Number of children: 2   Years of education: Not on file   Highest education level: Not on file  Occupational History   Occupation: Disabled  Tobacco Use   Smoking status: Never Smoker   Smokeless tobacco: Never Used  Scientific laboratory technician Use: Never used  Substance and Sexual Activity   Alcohol use: Yes    Alcohol/week: 0.0 standard drinks    Comment: seldom   Drug use: Yes    Frequency: 7.0 times per week    Comment: marijauna for arthritis, last 03-13-20   Sexual activity: Not Currently  Other Topics Concern   Not on file  Social History Narrative   He has children.   Social Determinants of Health   Financial Resource Strain: Low Risk    Difficulty of Paying Living Expenses: Not very hard  Food Insecurity: No Food Insecurity   Worried About Charity fundraiser in the Last Year: Never true   Ran Out of Food in the Last Year: Never true  Transportation Needs: No Transportation Needs   Lack of Transportation (Medical): No   Lack of Transportation (Non-Medical): No  Physical Activity: Not on file  Stress: Stress  Concern Present   Feeling of Stress : To some extent  Social Connections: Socially Isolated   Frequency of Communication with Friends and Family: More than three times a week   Frequency of Social Gatherings with Friends and Family: More than three times a week   Attends Religious Services: Never   Marine scientist or Organizations: No   Attends Music therapist: Never   Marital Status: Divorced  Human resources officer Violence: Not on file    FAMILY HISTORY: Family History  Problem Relation Age of Onset   Hyperlipidemia Father    Hypertension Father    Coronary artery disease Neg Hx        no early CAD.   Colon cancer Neg Hx    Stomach cancer Neg Hx    Rectal cancer Neg Hx    Esophageal cancer Neg Hx      ALLERGIES:  is allergic to codeine.  MEDICATIONS:  Current Outpatient Medications  Medication Sig Dispense Refill   amLODipine (NORVASC) 10 MG tablet Take 1 tablet (10 mg total) by mouth daily. 90 tablet 1   atorvastatin (LIPITOR) 20 MG tablet TAKE 1 TABLET (20 MG TOTAL) DAILY BY MOUTH. 90 tablet 3   Blood Glucose Monitoring Suppl (ONETOUCH VERIO IQ SYSTEM) w/Device KIT 1 each by Does not apply route 2 (two) times daily. 1 kit 0   carvedilol (COREG) 25 MG tablet TAKE 1 TABLET BY MOUTH TWICE A DAY WITH A MEAL (Patient taking differently: Take 25 mg by mouth 2 (two) times daily with a meal.) 180 tablet 3   chlorthalidone (HYGROTON) 25 MG tablet TAKE 1 TABLET BY MOUTH EVERY DAY 90 tablet 1   cloNIDine (CATAPRES) 0.1 MG tablet Take 1 tablet (0.1 mg total) by mouth 2 (two) times daily. 60 tablet 3   Continuous Blood Gluc Receiver (FREESTYLE LIBRE 2 READER) DEVI Use as directed to monitor blood glucose continuously. DX E11.65 1 each 1   Continuous Blood Gluc Sensor (FREESTYLE LIBRE 2 SENSOR) MISC Use as directed to monitor blood glucose continuously. Change sensor Q 14 days. DX E11.65 2 each 11   furosemide (LASIX) 20 MG tablet TAKE 1 TABLET (20 MG TOTAL) BY MOUTH DAILY AS NEEDED FOR FLUID. 90 tablet 1   glucose blood test strip 1 each by Other route as needed for other. Use as instructed 100 each 11   hydrALAZINE (APRESOLINE) 50 MG tablet TAKE 1 TABLET BY MOUTH THREE TIMES A DAY (Patient taking differently: Take 50 mg by mouth 3 (three) times daily.) 270 tablet 1   Insulin Pen Needle (B-D UF III MINI PEN NEEDLES) 31G X 5 MM MISC USE DAILY WITH INSULIN 100 each 5   Insulin Syringe-Needle U-100 (INSULIN SYRINGE .3CC/31GX5/16") 31G X 5/16" 0.3 ML MISC Use twice daily to inject insulin. 100 each 10   LEVEMIR FLEXTOUCH 100 UNIT/ML FlexPen INJECT 70 UNITS UNDER THE SKIN EVERY MORNING AND 40 UNITS AT BEDTIME 15 mL 3   lidocaine (XYLOCAINE) 2 % solution Patient: Mix 1part 2% viscous  lidocaine, 1part H20. Swish & swallow 6m of diluted mixture, 385m before meals and at bedtime, up to QID (Patient not taking: Reported on 04/25/2020) 200 mL 4   lidocaine-prilocaine (EMLA) cream Apply to affected area once (Patient not taking: Reported on 04/25/2020) 30 g 3   LORazepam (ATIVAN) 0.5 MG tablet Take 1 tablet (0.5 mg total) by mouth every 6 (six) hours as needed (Nausea or vomiting). 30 tablet 0   losartan (COZAAR) 50 MG  tablet Take 1 tablet (50 mg total) by mouth daily. 90 tablet 3   magnesium oxide (MAG-OX) 400 MG tablet Take 400 mg by mouth daily.     ondansetron (ZOFRAN) 8 MG tablet Take 1 tablet (8 mg total) by mouth 2 (two) times daily as needed. Start on the third day after cisplatin chemotherapy. (Patient not taking: Reported on 04/25/2020) 30 tablet 1   OneTouch Delica Lancets 45O MISC 1 each by Does not apply route 2 (two) times daily. 200 each 11   pioglitazone (ACTOS) 15 MG tablet Take 1 tablet (15 mg total) by mouth daily. 90 tablet 2   prochlorperazine (COMPAZINE) 10 MG tablet Take 1 tablet (10 mg total) by mouth every 6 (six) hours as needed (Nausea or vomiting). (Patient not taking: Reported on 04/25/2020) 30 tablet 1   temazepam (RESTORIL) 22.5 MG capsule Take 1 capsule (22.5 mg total) by mouth at bedtime as needed for sleep. Do not combine with Ambien. 30 capsule 0   warfarin (COUMADIN) 10 MG tablet Take 1 tablet (10 mg total) by mouth daily. 90 tablet 3   zolpidem (AMBIEN) 5 MG tablet Take 1 tablet (5 mg total) by mouth at bedtime as needed for sleep. 30 tablet 0   Current Facility-Administered Medications  Medication Dose Route Frequency Provider Last Rate Last Admin   sodium chloride flush (NS) 0.9 % injection 10 mL  10 mL Intravenous PRN Tracy Kinner, Arletha Pili, MD   10 mL at 05/09/20 1005     PHYSICAL EXAMINATION: ECOG PERFORMANCE STATUS: 0 - Asymptomatic  Vitals:   05/09/20 0929  BP: 105/68  Pulse: 66  Resp: 15  Temp: 97.7 F (36.5 C)  SpO2: 99%    Filed Weights   05/09/20 0929  Weight: 196 lb 12.8 oz (89.3 kg)   Physical Exam Constitutional:      Appearance: Normal appearance.  HENT:     Head: Normocephalic and atraumatic.     Nose: Nose normal.     Mouth/Throat:     Mouth: Mucous membranes are moist.  Cardiovascular:     Rate and Rhythm: Normal rate and regular rhythm.     Pulses: Normal pulses.     Heart sounds: Normal heart sounds.  Pulmonary:     Effort: Pulmonary effort is normal.     Breath sounds: Normal breath sounds.  Abdominal:     General: Abdomen is flat.     Palpations: Abdomen is soft.  Musculoskeletal:        General: No swelling. Normal range of motion.     Cervical back: Normal range of motion and neck supple.  Lymphadenopathy:     Cervical: Cervical adenopathy (not palpable today. No mucositis noted) present.  Skin:    General: Skin is warm and dry.  Neurological:     General: No focal deficit present.     Mental Status: He is alert and oriented to person, place, and time.  Psychiatric:        Mood and Affect: Mood normal.        Behavior: Behavior normal.      LABORATORY DATA:  I have reviewed the data as listed Lab Results  Component Value Date   WBC 2.1 (L) 05/09/2020   HGB 8.4 (L) 05/09/2020   HCT 24.9 (L) 05/09/2020   MCV 85.0 05/09/2020   PLT 83 (L) 05/09/2020     Chemistry      Component Value Date/Time   NA 128 (L) 05/09/2020 0914   K 4.3 05/09/2020  0914   CL 96 (L) 05/09/2020 0914   CO2 24 05/09/2020 0914   BUN 59 (H) 05/09/2020 0914   CREATININE 2.33 (H) 05/09/2020 0914   CREATININE 1.23 11/30/2019 0915      Component Value Date/Time   CALCIUM 8.7 (L) 05/09/2020 0914   ALKPHOS 51 05/09/2020 0914   AST 10 (L) 05/09/2020 0914   ALT 14 05/09/2020 0914   BILITOT 0.4 05/09/2020 0914       RADIOGRAPHIC STUDIES: I have personally reviewed the radiological images as listed and agreed with the findings in the report. DG Chest Port 1 View  Result Date:  05/02/2020 CLINICAL DATA:  Sepsis. EXAM: PORTABLE CHEST 1 VIEW COMPARISON:  January 20, 2019. FINDINGS: Stable cardiomediastinal silhouette. No pneumothorax or pleural effusion is noted. Interval placement of right internal jugular Port-A-Cath with distal tip in expected position of the SVC. Both lungs are clear. The visualized skeletal structures are unremarkable. IMPRESSION: No active disease. Electronically Signed   By: Marijo Conception M.D.   On: 05/02/2020 08:56   I reviewed the image myself.  Noted hypermetabolic lesions at the base of tongue on the right side and right tonsil as well as the bilateral bulky lymphadenopathy.  02/10/2020  SURGICAL PATHOLOGY    Reason for Addendum #1: Immunohistochemistry results   Clinical History: throat cancer ([place])    FINAL MICROSCOPIC DIAGNOSIS:   A. OROPHARYNGEAL, RIGHT, BIOPSY:  - Squamous cell carcinoma  - See comment   COMMENT:   P16 immunohistochemistry is pending and will be reported in an addendum.  Dr. Tresa Moore reviewed the case and agrees with the above diagnosis. Dr.  Benjamine Mola was notified of these results on February 11, 2020.   GROSS DESCRIPTION:   The specimen is received in saline and consists of a 1.6 x 1.5 x 0.3 cm  aggregate of tan-pink soft tissue. The specimen is entirely submitted  in 1 cassette. Craig Staggers 02/10/2020)    Final Diagnosis performed by Thressa Sheller, MD.  Electronically signed  02/11/2020  Technical and / or Professional components performed at [facility]. [facility], [place] [address] [place], [place] [address].  Immunohistochemistry Technical component (if applicable) was performed  at [facility]. [address], [place],  [place], [place] [address].  IMMUNOHISTOCHEMISTRY DISCLAIMER (if applicable):  Some of these immunohistochemical stains may have been developed and the  performance characteristics determine by [facility]. Some  may not have been cleared or  approved by the U.S. Food and Drug  Administration. The FDA has determined that such clearance or approval  is not necessary. This test is used for clinical purposes. It should not  be regarded as investigational or for research. This laboratory is  certified under the [place]  (CLIA-88) as qualified to perform high complexity clinical laboratory  testing. The controls stained appropriately.   ADDENDUM:   P16 immunohistochemistry is POSITIVE.   Reviewed labs. AKI and low Mg. Will provide Mag replacement. Encouraged oral hydration Will proceed with chemo today.  All questions were answered. The patient knows to call the clinic with any problems, questions or concerns.. I spent 30 minutes in the care of this patient including H and P, review of records, counseling and coordination of care.    [name], MD 05/09/2020 10:15 AM

## 2020-05-09 NOTE — Assessment & Plan Note (Addendum)
Again, BUN and creatinine worse. Encouraged hydration. Offered IV fluids but patient didn't want them since he is able to drink and eat ok. He was recommended to FU with Dr Moshe Cipro and he acknowledged

## 2020-05-09 NOTE — Assessment & Plan Note (Addendum)
he is using 10 mg QHS of ambien. He is not taking restoril Overall he is able to sleep decently

## 2020-05-10 ENCOUNTER — Inpatient Hospital Stay: Payer: Medicare Other

## 2020-05-10 ENCOUNTER — Telehealth: Payer: Self-pay | Admitting: Dietician

## 2020-05-10 DIAGNOSIS — N189 Chronic kidney disease, unspecified: Secondary | ICD-10-CM | POA: Diagnosis not present

## 2020-05-10 DIAGNOSIS — Z5111 Encounter for antineoplastic chemotherapy: Secondary | ICD-10-CM | POA: Diagnosis not present

## 2020-05-10 DIAGNOSIS — R197 Diarrhea, unspecified: Secondary | ICD-10-CM | POA: Diagnosis not present

## 2020-05-10 DIAGNOSIS — C109 Malignant neoplasm of oropharynx, unspecified: Secondary | ICD-10-CM | POA: Diagnosis not present

## 2020-05-10 DIAGNOSIS — I4891 Unspecified atrial fibrillation: Secondary | ICD-10-CM | POA: Diagnosis not present

## 2020-05-10 DIAGNOSIS — I129 Hypertensive chronic kidney disease with stage 1 through stage 4 chronic kidney disease, or unspecified chronic kidney disease: Secondary | ICD-10-CM | POA: Diagnosis not present

## 2020-05-10 DIAGNOSIS — G4700 Insomnia, unspecified: Secondary | ICD-10-CM | POA: Diagnosis not present

## 2020-05-10 DIAGNOSIS — E1122 Type 2 diabetes mellitus with diabetic chronic kidney disease: Secondary | ICD-10-CM | POA: Diagnosis not present

## 2020-05-10 DIAGNOSIS — C01 Malignant neoplasm of base of tongue: Secondary | ICD-10-CM

## 2020-05-10 DIAGNOSIS — Z452 Encounter for adjustment and management of vascular access device: Secondary | ICD-10-CM | POA: Diagnosis not present

## 2020-05-10 MED ORDER — MAGNESIUM SULFATE 2 GM/50ML IV SOLN
INTRAVENOUS | Status: AC
  Filled 2020-05-10: qty 50

## 2020-05-10 MED ORDER — HEPARIN SOD (PORK) LOCK FLUSH 100 UNIT/ML IV SOLN
500.0000 [IU] | Freq: Once | INTRAVENOUS | Status: AC
  Administered 2020-05-10: 500 [IU] via INTRAVENOUS
  Filled 2020-05-10: qty 5

## 2020-05-10 MED ORDER — SODIUM CHLORIDE 0.9% FLUSH
10.0000 mL | Freq: Once | INTRAVENOUS | Status: AC
  Administered 2020-05-10: 10 mL via INTRAVENOUS
  Filled 2020-05-10: qty 10

## 2020-05-10 MED ORDER — SODIUM CHLORIDE 0.9 % IV SOLN
Freq: Once | INTRAVENOUS | Status: AC
  Filled 2020-05-10: qty 250

## 2020-05-10 MED ORDER — MAGNESIUM SULFATE 2 GM/50ML IV SOLN
2.0000 g | Freq: Once | INTRAVENOUS | Status: AC
  Administered 2020-05-10: 2 g via INTRAVENOUS

## 2020-05-10 NOTE — Telephone Encounter (Signed)
Nutrition    Patient scheduled for nutrition follow-up on during infusion for tongue cancer. He is receiving concurrent chemoradiation therapy and completed radiation therapy on 3/15. Patient did not receive treatment on 3/21, unable to complete follow-up as scheduled.   Attempted to contact patient via telephone for follow-up this afternoon. Message requesting call back with contact information left on voicemail.

## 2020-05-10 NOTE — Patient Instructions (Signed)
Rehydration, Adult Rehydration is the replacement of body fluids, salts, and minerals (electrolytes) that are lost during dehydration. Dehydration is when there is not enough water or other fluids in the body. This happens when you lose more fluids than you take in. Common causes of dehydration include:  Not drinking enough fluids. This can occur when you are ill or doing activities that require a lot of energy, especially in hot weather.  Conditions that cause loss of water or other fluids, such as diarrhea, vomiting, sweating, or urinating a lot.  Other illnesses, such as fever or infection.  Certain medicines, such as those that remove excess fluid from the body (diuretics). Symptoms of mild or moderate dehydration may include thirst, dry lips and mouth, and dizziness. Symptoms of severe dehydration may include increased heart rate, confusion, fainting, and not urinating. For severe dehydration, you may need to get fluids through an IV at the hospital. For mild or moderate dehydration, you can usually rehydrate at home by drinking certain fluids as told by your health care provider. What are the risks? Generally, rehydration is safe. However, taking in too much fluid (overhydration) can be a problem. This is rare. Overhydration can cause an electrolyte imbalance, kidney failure, or a decrease in salt (sodium) levels in the body. Supplies needed You will need an oral rehydration solution (ORS) if your health care provider tells you to use one. This is a drink to treat dehydration. It can be found in pharmacies and retail stores. How to rehydrate Fluids Follow instructions from your health care provider for rehydration. The kind of fluid and the amount you should drink depend on your condition. In general, you should choose drinks that you prefer.  If told by your health care provider, drink an ORS. ? Make an ORS by following instructions on the package. ? Start by drinking small amounts,  about  cup (120 mL) every 5-10 minutes. ? Slowly increase how much you drink until you have taken the amount recommended by your health care provider.  Drink enough clear fluids to keep your urine pale yellow. If you were told to drink an ORS, finish it first, then start slowly drinking other clear fluids. Drink fluids such as: ? Water. This includes sparkling water and flavored water. Drinking only water can lead to having too little sodium in your body (hyponatremia). Follow the advice of your health care provider. ? Water from ice chips you suck on. ? Fruit juice with water you add to it (diluted). ? Sports drinks. ? Hot or cold herbal teas. ? Broth-based soups. ? Milk or milk products. Food Follow instructions from your health care provider about what to eat while you rehydrate. Your health care provider may recommend that you slowly begin eating regular foods in small amounts.  Eat foods that contain a healthy balance of electrolytes, such as bananas, oranges, potatoes, tomatoes, and spinach.  Avoid foods that are greasy or contain a lot of sugar. In some cases, you may get nutrition through a feeding tube that is passed through your nose and into your stomach (nasogastric tube, or NG tube). This may be done if you have uncontrolled vomiting or diarrhea.   Beverages to avoid Certain beverages may make dehydration worse. While you rehydrate, avoid drinking alcohol.   How to tell if you are recovering from dehydration You may be recovering from dehydration if:  You are urinating more often than before you started rehydrating.  Your urine is pale yellow.  Your energy level   improves.  You vomit less frequently.  You have diarrhea less frequently.  Your appetite improves or returns to normal.  You feel less dizzy or less light-headed.  Your skin tone and color start to look more normal. Follow these instructions at home:  Take over-the-counter and prescription medicines only  as told by your health care provider.  Do not take sodium tablets. Doing this can lead to having too much sodium in your body (hypernatremia). Contact a health care provider if:  You continue to have symptoms of mild or moderate dehydration, such as: ? Thirst. ? Dry lips. ? Slightly dry mouth. ? Dizziness. ? Dark urine or less urine than normal. ? Muscle cramps.  You continue to vomit or have diarrhea. Get help right away if you:  Have symptoms of dehydration that get worse.  Have a fever.  Have a severe headache.  Have been vomiting and the following happens: ? Your vomiting gets worse or does not go away. ? Your vomit includes blood or green matter (bile). ? You cannot eat or drink without vomiting.  Have problems with urination or bowel movements, such as: ? Diarrhea that gets worse or does not go away. ? Blood in your stool (feces). This may cause stool to look black and tarry. ? Not urinating, or urinating only a small amount of very dark urine, within 6-8 hours.  Have trouble breathing.  Have symptoms that get worse with treatment. These symptoms may represent a serious problem that is an emergency. Do not wait to see if the symptoms will go away. Get medical help right away. Call your local emergency services (911 in the [place] Do not drive yourself to the hospital. Summary  Rehydration is the replacement of body fluids and minerals (electrolytes) that are lost during dehydration.  Follow instructions from your health care provider for rehydration. The kind of fluid and amount you should drink depend on your condition.  Slowly increase how much you drink until you have taken the amount recommended by your health care provider.  Contact your health care provider if you continue to show signs of mild or moderate dehydration. This information is not intended to replace advice given to you by your health care provider. Make sure you discuss any questions you have with  your health care provider. Document Revised: 04/08/2019 Document Reviewed: 02/16/2019 Elsevier Patient Education  2021 [employer]  

## 2020-05-11 ENCOUNTER — Telehealth: Payer: Self-pay | Admitting: Hematology and Oncology

## 2020-05-11 NOTE — Telephone Encounter (Signed)
Scheduled per 03/21 los, patient has been called and voicemail was left. 

## 2020-05-12 ENCOUNTER — Ambulatory Visit: Payer: Medicare Other | Admitting: Physical Therapy

## 2020-05-13 ENCOUNTER — Other Ambulatory Visit: Payer: Self-pay

## 2020-05-13 ENCOUNTER — Ambulatory Visit (INDEPENDENT_AMBULATORY_CARE_PROVIDER_SITE_OTHER): Payer: Medicare Other | Admitting: Family Medicine

## 2020-05-13 DIAGNOSIS — I4891 Unspecified atrial fibrillation: Secondary | ICD-10-CM | POA: Diagnosis not present

## 2020-05-13 LAB — PT WITH INR/FINGERSTICK
INR, fingerstick: 3.8 ratio — ABNORMAL HIGH
PT, fingerstick: 45.6 s — ABNORMAL HIGH (ref 10.5–13.1)

## 2020-05-13 MED ORDER — WARFARIN SODIUM 10 MG PO TABS: 10.0000 mg | ORAL_TABLET | Freq: Every day | ORAL | 3 refills | Status: DC

## 2020-05-13 NOTE — Progress Notes (Signed)
Subjective:    Patient ID: Brian [name], male    DOB: [birth year], 65 y.o.   MRN: [id number]  Patient seems very pessimistic and sad today.  He states that he is tired of seeing doctors.  He states he is sick of seeing [name].  He had to go to the hospital for hypotension recently.  I believe that due to weight loss, his blood pressures been dropping.  His INR today is supratherapeutic at 3.8.  He is currently taking Coumadin 10 mg every day except Monday and Fridays when he takes 5 mg.  He denies any bleeding or bruising.  I directly asked him if he feels depressed.  We discussed some options to treat anxiety and depression however he states he does not want any more medication.  He is sick of taking medication.  He is very tearful on exam today.  He also seems very frustrated. Past Surgical History:  Procedure Laterality Date  . DIRECT LARYNGOSCOPY N/A 02/10/2020   Procedure: DIRECT LARYNGOSCOPY WITH BIOPSY;  Surgeon: [name], MD;  Location: [facility];  Service: ENT;  Laterality: N/A;  . Ear surgery as a child    . IR GASTROSTOMY TUBE MOD SED  03/23/2020  . IR IMAGING GUIDED PORT INSERTION  03/09/2020  . TONSILLECTOMY     Current Outpatient Medications on File Prior to Visit  Medication Sig Dispense Refill  . amLODipine (NORVASC) 10 MG tablet Take 1 tablet (10 mg total) by mouth daily. 90 tablet 1  . atorvastatin (LIPITOR) 20 MG tablet TAKE 1 TABLET (20 MG TOTAL) DAILY BY MOUTH. 90 tablet 3  . Blood Glucose Monitoring Suppl (ONETOUCH VERIO IQ SYSTEM) w/Device KIT 1 each by Does not apply route 2 (two) times daily. 1 kit 0  . carvedilol (COREG) 25 MG tablet TAKE 1 TABLET BY MOUTH TWICE A DAY WITH A MEAL (Patient taking differently: Take 25 mg by mouth 2 (two) times daily with a meal.) 180 tablet 3  . chlorthalidone (HYGROTON) 25 MG tablet TAKE 1 TABLET BY MOUTH EVERY DAY 90 tablet 1  . cloNIDine (CATAPRES) 0.1 MG tablet Take 1 tablet (0.1 mg total) by mouth 2 (two) times daily. 60 tablet 3  .  Continuous Blood Gluc Receiver (FREESTYLE LIBRE 2 READER) DEVI Use as directed to monitor blood glucose continuously. DX E11.65 1 each 1  . Continuous Blood Gluc Sensor (FREESTYLE LIBRE 2 SENSOR) MISC Use as directed to monitor blood glucose continuously. Change sensor Q 14 days. DX E11.65 2 each 11  . furosemide (LASIX) 20 MG tablet TAKE 1 TABLET (20 MG TOTAL) BY MOUTH DAILY AS NEEDED FOR FLUID. 90 tablet 1  . glucose blood test strip 1 each by Other route as needed for other. Use as instructed 100 each 11  . hydrALAZINE (APRESOLINE) 50 MG tablet TAKE 1 TABLET BY MOUTH THREE TIMES A DAY 270 tablet 1  . Insulin Pen Needle (B-D UF III MINI PEN NEEDLES) 31G X 5 MM MISC USE DAILY WITH INSULIN 100 each 5  . Insulin Syringe-Needle U-100 (INSULIN SYRINGE .3CC/31GX5/16") 31G X 5/16" 0.3 ML MISC Use twice daily to inject insulin. 100 each 10  . LEVEMIR FLEXTOUCH 100 UNIT/ML FlexPen INJECT 70 UNITS UNDER THE SKIN EVERY MORNING AND 40 UNITS AT BEDTIME 15 mL 3  . lidocaine (XYLOCAINE) 2 % solution Patient: Mix 1part 2% viscous lidocaine, 1part H20. Swish & swallow 34m of diluted mixture, 388m before meals and at bedtime, up to QID (Patient not taking: Reported  on 04/25/2020) 200 mL 4  . lidocaine-prilocaine (EMLA) cream Apply to affected area once (Patient not taking: Reported on 04/25/2020) 30 g 3  . LORazepam (ATIVAN) 0.5 MG tablet Take 1 tablet (0.5 mg total) by mouth every 6 (six) hours as needed (Nausea or vomiting). 30 tablet 0  . losartan (COZAAR) 50 MG tablet Take 1 tablet (50 mg total) by mouth daily. 90 tablet 3  . magnesium oxide (MAG-OX) 400 MG tablet Take 400 mg by mouth daily.    . ondansetron (ZOFRAN) 8 MG tablet Take 1 tablet (8 mg total) by mouth 2 (two) times daily as needed. Start on the third day after cisplatin chemotherapy. (Patient not taking: Reported on 04/25/2020) 30 tablet 1  . OneTouch Delica Lancets 16X MISC 1 each by Does not apply route 2 (two) times daily. 200 each 11  . pioglitazone  (ACTOS) 15 MG tablet Take 1 tablet (15 mg total) by mouth daily. 90 tablet 2  . prochlorperazine (COMPAZINE) 10 MG tablet Take 1 tablet (10 mg total) by mouth every 6 (six) hours as needed (Nausea or vomiting). (Patient not taking: Reported on 04/25/2020) 30 tablet 1  . temazepam (RESTORIL) 22.5 MG capsule Take 1 capsule (22.5 mg total) by mouth at bedtime as needed for sleep. Do not combine with Ambien. 30 capsule 0  . zolpidem (AMBIEN) 5 MG tablet Take 1 tablet (5 mg total) by mouth at bedtime as needed for sleep. 30 tablet 0   No current facility-administered medications on file prior to visit.   Allergies  Allergen Reactions  . Codeine Other (See Comments)    Unknown reaction, severe headach   Social History   Socioeconomic History  . Marital status: Divorced    Spouse name: Not on file  . Number of children: 2  . Years of education: Not on file  . Highest education level: Not on file  Occupational History  . Occupation: Disabled  Tobacco Use  . Smoking status: Never Smoker  . Smokeless tobacco: Never Used  Vaping Use  . Vaping Use: Never used  Substance and Sexual Activity  . Alcohol use: Yes    Alcohol/week: 0.0 standard drinks    Comment: seldom  . Drug use: Yes    Frequency: 7.0 times per week    Comment: marijauna for arthritis, last 03-13-20  . Sexual activity: Not Currently  Other Topics Concern  . Not on file  Social History Narrative   He has children.   Social Determinants of Health   Financial Resource Strain: Low Risk   . Difficulty of Paying Living Expenses: Not very hard  Food Insecurity: No Food Insecurity  . Worried About Charity fundraiser in the Last Year: Never true  . Ran Out of Food in the Last Year: Never true  Transportation Needs: No Transportation Needs  . Lack of Transportation (Medical): No  . Lack of Transportation (Non-Medical): No  Physical Activity: Not on file  Stress: Stress Concern Present  . Feeling of Stress : To some extent   Social Connections: Socially Isolated  . Frequency of Communication with Friends and Family: More than three times a week  . Frequency of Social Gatherings with Friends and Family: More than three times a week  . Attends Religious Services: Never  . Active Member of Clubs or Organizations: No  . Attends Archivist Meetings: Never  . Marital Status: Divorced  Human resources officer Violence: Not on file    Review of Systems  All other  systems reviewed and are negative.      Objective:   Physical Exam Vitals reviewed.  HENT:     Head:     Salivary Glands: Left salivary gland is diffusely enlarged and tender.   Cardiovascular:     Rate and Rhythm: Normal rate and regular rhythm.     Heart sounds: Normal heart sounds.  Pulmonary:     Effort: Pulmonary effort is normal. No respiratory distress.     Breath sounds: Normal breath sounds. No wheezing or rales.  Chest:     Chest wall: No tenderness.  Abdominal:     General: Bowel sounds are normal. There is no distension.     Palpations: Abdomen is soft.     Tenderness: There is no abdominal tenderness. There is no rebound.  Musculoskeletal:     Right lower leg: No edema.     Left lower leg: No edema.  Lymphadenopathy:     Cervical: Cervical adenopathy present.     Right cervical: Superficial cervical adenopathy present.     Left cervical: Superficial cervical adenopathy present.           Assessment & Plan:  Atrial fibrillation, unspecified type ([place]) - Plan: PT with INR/Fingerstick  INR supratherapeutic.  Hold Coumadin.  Restart Coumadin tomorrow.  Take 5 mg on Monday, Wednesday, Friday, Saturday.  Take 10 mg on Tuesday, Thursday, Sunday.  Recheck INR in 4 weeks.  Recommend he check his blood pressure every day and hold clonidine if blood pressures less than 130/80.  I believe that based on his hydration and oral intake he may need to periodically hold his blood pressure medication however today its normal and  slightly elevated

## 2020-05-20 ENCOUNTER — Ambulatory Visit
Admission: RE | Admit: 2020-05-20 | Discharge: 2020-05-20 | Disposition: A | Discharge status: Home / Self Care | Payer: Medicare Other | Source: Ambulatory Visit | Attending: Radiation Oncology | Admitting: Radiation Oncology

## 2020-05-20 ENCOUNTER — Other Ambulatory Visit: Payer: Self-pay

## 2020-05-20 VITALS — BP 181/98 | HR 94 | Temp 97.0°F | Resp 18 | Ht 69.0 in | Wt 198.2 lb

## 2020-05-20 DIAGNOSIS — Z79899 Other long term (current) drug therapy: Secondary | ICD-10-CM | POA: Insufficient documentation

## 2020-05-20 DIAGNOSIS — C108 Malignant neoplasm of overlapping sites of oropharynx: Secondary | ICD-10-CM | POA: Diagnosis not present

## 2020-05-20 DIAGNOSIS — R682 Dry mouth, unspecified: Secondary | ICD-10-CM | POA: Insufficient documentation

## 2020-05-20 DIAGNOSIS — Z9221 Personal history of antineoplastic chemotherapy: Secondary | ICD-10-CM | POA: Insufficient documentation

## 2020-05-20 DIAGNOSIS — Z7901 Long term (current) use of anticoagulants: Secondary | ICD-10-CM | POA: Insufficient documentation

## 2020-05-20 DIAGNOSIS — C01 Malignant neoplasm of base of tongue: Secondary | ICD-10-CM

## 2020-05-20 DIAGNOSIS — Z923 Personal history of irradiation: Secondary | ICD-10-CM | POA: Insufficient documentation

## 2020-05-20 DIAGNOSIS — M542 Cervicalgia: Secondary | ICD-10-CM | POA: Diagnosis not present

## 2020-05-20 NOTE — Progress Notes (Signed)
Oncology Nurse Navigator Documentation  I met with Mr. Lamson before his follow up appointment with Dr. Isidore Moos today. He reports doing very well after completing treatment for head and neck cancer. He is aware of his appointments next week with Garald Balding SLP and Dr. Chryl Heck. He knows to call me if he has any needs or questions.   Harlow Asa RN, BSN, OCN Head & Neck Oncology Nurse [place] at [facility] Phone # [phone]  Fax # [phone]

## 2020-05-20 NOTE — Progress Notes (Signed)
[name] presents today for a 2-week follow up after completing radiation to his base of tongue on 05/04/2020  Pain issues, if any: Reports mild tenderness behind his left ear/neck, but states it's tolerable Using a feeding tube?: No--didn't use throughout treatment and is ready to have removed Weight changes, if any:  Wt Readings from Last 3 Encounters:  05/20/20 198 lb 4 oz (89.9 kg)  05/13/20 197 lb (89.4 kg)  05/09/20 196 lb 12.8 oz (89.3 kg)   Swallowing issues, if any: Patient denies. Reports a healthy appetite, and feels he can eat a wide variety of food/beverages  Smoking or chewing tobacco? None Using fluoride trays daily? N/A Last ENT visit was on: Not since diagnosis Other notable issues, if any: BP elevated this morning, but patient admits he forgot to take his BP medication before coming (has another F/U with his PCP in May for ongoing BP monitoring/management). F/U with Dr. Chryl Heck on 05/26/2020. Reports mild improvement in energy and stamina. Denies any ear or jaw pain, or difficulty opening his mouth fully. Denies any swelling or signs of lymphedema under his jaw/neck. Reports occasional dry mouth/thick saliva (but feels it is improving since completing treatment). Skin in treatment field appears intact with hyperpigmentation, and a few patches of dryness/peeling. Patient continues to use Sonafine lotion as directed.   Vitals:   05/20/20 0957  BP: (!) 181/98  Pulse: 94  Resp: 18  Temp: (!) 97 F (36.1 C)  SpO2: 98%

## 2020-05-25 ENCOUNTER — Encounter: Payer: Self-pay | Admitting: Radiation Oncology

## 2020-05-25 ENCOUNTER — Ambulatory Visit: Payer: Medicare Other

## 2020-05-25 NOTE — Progress Notes (Signed)
Radiation Oncology         [phone] ________________________________  Name: Brian [name] MRN: [id number]  Date: 05/20/2020  DOB: 05-27-1955  Follow-Up Visit Note  CC: [name], MD  [name], MD  Diagnosis and Prior Radiotherapy:       ICD-10-CM   1. Malignant neoplasm of base of tongue ([place])  C01    Cancer Staging Malignant neoplasm of base of tongue ([place]) Staging form: Pharynx - HPV-Mediated Oropharynx, AJCC 8th Edition - Clinical stage from 03/01/2020: Stage III (cT2, cN3, cM0, p16+) - Signed by [name], MD on 03/02/2020 Stage prefix: Initial diagnosis   CHIEF COMPLAINT:  Here for follow-up and surveillance of throat cancer  Narrative:  The patient returns today for routine follow-up.   [name] presents today for a 2-week follow up after completing radiation to his base of tongue on 05/04/2020  Pain issues, if any: Reports mild tenderness behind his left ear/neck, but states it's tolerable Using a feeding tube?: No--didn't use throughout treatment and is ready to have removed Weight changes, if any:  Wt Readings from Last 3 Encounters:  05/20/20 198 lb 4 oz (89.9 kg)  05/13/20 197 lb (89.4 kg)  05/09/20 196 lb 12.8 oz (89.3 kg)   Swallowing issues, if any: Patient denies. Reports a healthy appetite, and feels he can eat a wide variety of food/beverages  Smoking or chewing tobacco? None Using fluoride trays daily? N/A Last ENT visit was on: Not since diagnosis Other notable issues, if any: BP elevated this morning, but patient admits he forgot to take his BP medication before coming (has another F/U with his PCP in May for ongoing BP monitoring/management). F/U with Dr. Chryl Heck on 05/26/2020. Reports mild improvement in energy and stamina. Denies any ear or jaw pain, or difficulty opening his mouth fully. Denies any swelling or signs of lymphedema under his jaw/neck. Reports occasional dry mouth/thick saliva (but feels it is improving since completing  treatment). Skin in treatment field appears intact with hyperpigmentation, and a few patches of dryness/peeling. Patient continues to use Sonafine lotion as directed.   Vitals:   05/20/20 0957  BP: (!) 181/98  Pulse: 94  Resp: 18  Temp: (!) 97 F (36.1 C)  SpO2: 98%                       ALLERGIES:  is allergic to codeine.  Meds: Current Outpatient Medications  Medication Sig Dispense Refill  . amLODipine (NORVASC) 10 MG tablet Take 1 tablet (10 mg total) by mouth daily. 90 tablet 1  . atorvastatin (LIPITOR) 20 MG tablet TAKE 1 TABLET (20 MG TOTAL) DAILY BY MOUTH. 90 tablet 3  . Blood Glucose Monitoring Suppl (ONETOUCH VERIO IQ SYSTEM) w/Device KIT 1 each by Does not apply route 2 (two) times daily. 1 kit 0  . carvedilol (COREG) 25 MG tablet TAKE 1 TABLET BY MOUTH TWICE A DAY WITH A MEAL (Patient taking differently: Take 25 mg by mouth 2 (two) times daily with a meal.) 180 tablet 3  . chlorthalidone (HYGROTON) 25 MG tablet TAKE 1 TABLET BY MOUTH EVERY DAY 90 tablet 1  . cloNIDine (CATAPRES) 0.1 MG tablet Take 1 tablet (0.1 mg total) by mouth 2 (two) times daily. 60 tablet 3  . Continuous Blood Gluc Receiver (FREESTYLE LIBRE 2 READER) DEVI Use as directed to monitor blood glucose continuously. DX E11.65 1 each 1  . Continuous Blood Gluc Sensor (FREESTYLE LIBRE 2 SENSOR) MISC Use  as directed to monitor blood glucose continuously. Change sensor Q 14 days. DX E11.65 2 each 11  . furosemide (LASIX) 20 MG tablet TAKE 1 TABLET (20 MG TOTAL) BY MOUTH DAILY AS NEEDED FOR FLUID. 90 tablet 1  . glucose blood test strip 1 each by Other route as needed for other. Use as instructed 100 each 11  . hydrALAZINE (APRESOLINE) 50 MG tablet TAKE 1 TABLET BY MOUTH THREE TIMES A DAY 270 tablet 1  . Insulin Pen Needle (B-D UF III MINI PEN NEEDLES) 31G X 5 MM MISC USE DAILY WITH INSULIN 100 each 5  . Insulin Syringe-Needle U-100 (INSULIN SYRINGE .3CC/31GX5/16") 31G X 5/16" 0.3 ML MISC Use twice daily to inject  insulin. 100 each 10  . LEVEMIR FLEXTOUCH 100 UNIT/ML FlexPen INJECT 70 UNITS UNDER THE SKIN EVERY MORNING AND 40 UNITS AT BEDTIME 15 mL 3  . lidocaine (XYLOCAINE) 2 % solution Patient: Mix 1part 2% viscous lidocaine, 1part H20. Swish & swallow 25m of diluted mixture, 31m before meals and at bedtime, up to QID (Patient not taking: Reported on 04/25/2020) 200 mL 4  . lidocaine-prilocaine (EMLA) cream Apply to affected area once (Patient not taking: Reported on 04/25/2020) 30 g 3  . LORazepam (ATIVAN) 0.5 MG tablet Take 1 tablet (0.5 mg total) by mouth every 6 (six) hours as needed (Nausea or vomiting). 30 tablet 0  . losartan (COZAAR) 50 MG tablet Take 1 tablet (50 mg total) by mouth daily. 90 tablet 3  . magnesium oxide (MAG-OX) 400 MG tablet Take 400 mg by mouth daily.    . ondansetron (ZOFRAN) 8 MG tablet Take 1 tablet (8 mg total) by mouth 2 (two) times daily as needed. Start on the third day after cisplatin chemotherapy. (Patient not taking: Reported on 04/25/2020) 30 tablet 1  . OneTouch Delica Lancets 3327NISC 1 each by Does not apply route 2 (two) times daily. 200 each 11  . pioglitazone (ACTOS) 15 MG tablet Take 1 tablet (15 mg total) by mouth daily. 90 tablet 2  . prochlorperazine (COMPAZINE) 10 MG tablet Take 1 tablet (10 mg total) by mouth every 6 (six) hours as needed (Nausea or vomiting). (Patient not taking: Reported on 04/25/2020) 30 tablet 1  . temazepam (RESTORIL) 22.5 MG capsule Take 1 capsule (22.5 mg total) by mouth at bedtime as needed for sleep. Do not combine with Ambien. 30 capsule 0  . warfarin (COUMADIN) 10 MG tablet Take 1 tablet (10 mg total) by mouth daily. 90 tablet 3  . zolpidem (AMBIEN) 5 MG tablet Take 1 tablet (5 mg total) by mouth at bedtime as needed for sleep. 30 tablet 0   No current facility-administered medications for this encounter.    Physical Findings: The patient is in no acute distress. Patient is alert and oriented. Wt Readings from Last 3 Encounters:   05/20/20 198 lb 4 oz (89.9 kg)  05/13/20 197 lb (89.4 kg)  05/09/20 196 lb 12.8 oz (89.3 kg)    height is 5' 9"  (1.753 m) and weight is 198 lb 4 oz (89.9 kg). His temporal temperature is 97 F (36.1 C) (abnormal). His blood pressure is 181/98 (abnormal) and his pulse is 94. His respiration is 18 and oxygen saturation is 98%. .  General: Alert and oriented, in no acute distress HEENT: Head is normocephalic. Extraocular movements are intact. Oropharynx is notable for no thrush, healing mucosa Neck: Neck is notable for dry skin with resolving dry desquamation.  Fullness in the bilateral level 2  regions of the neck with marked improvement compared to bulky adenopathy pretreatment Skin: Skin in treatment fields shows resolving dry desquamation and hyperpigmentation Psychiatric: Judgment and insight are intact. Affect is appropriate.   Lab Findings: Lab Results  Component Value Date   WBC 2.1 (L) 05/09/2020   HGB 8.4 (L) 05/09/2020   HCT 24.9 (L) 05/09/2020   MCV 85.0 05/09/2020   PLT 83 (L) 05/09/2020    Lab Results  Component Value Date   TSH 1.293 03/15/2020    Radiographic Findings: DG Chest Port 1 View  Result Date: 05/02/2020 CLINICAL DATA:  Sepsis. EXAM: PORTABLE CHEST 1 VIEW COMPARISON:  January 20, 2019. FINDINGS: Stable cardiomediastinal silhouette. No pneumothorax or pleural effusion is noted. Interval placement of right internal jugular Port-A-Cath with distal tip in expected position of the SVC. Both lungs are clear. The visualized skeletal structures are unremarkable. IMPRESSION: No active disease. Electronically Signed   By: Marijo Conception M.D.   On: 05/02/2020 08:56    Impression/Plan:    1) Head and Neck Cancer Status: Healing well from radiation therapy and chemotherapy.  Advised to moisturize skin with topical sonafine or other body lotions twice a day for 2 more months  2) Nutritional Status:  Wt Readings from Last 3 Encounters:  05/20/20 198 lb 4 oz (89.9 kg)   05/13/20 197 lb (89.4 kg)  05/09/20 196 lb 12.8 oz (89.3 kg)    PEG tube: He has done remarkably well without needing to use his PEG tube.  We will order for this to be removed in 2 weeks and he will let us know if he decompensates and starts to need it beforehand.  3)   Swallowing: Excellent function, continue speech-language pathology exercises  4) Dental: Encouraged to continue regular followup with dentistry, and dental hygiene including fluoride products   5) Thyroid function: Check annually Lab Results  Component Value Date   TSH 1.293 03/15/2020    6) Other:  Follow-up  with restaging PET scan in 3 months. The patient was encouraged to call with any issues or questions before then.  Follow-up in the interim with medical oncology as scheduled.  On date of service, in total, I spent 25 minutes on this encounter. Patient was seen in person. _____________________________________   [name], MD

## 2020-05-26 ENCOUNTER — Inpatient Hospital Stay: Payer: Medicare Other | Attending: Hematology and Oncology | Admitting: Hematology and Oncology

## 2020-05-26 ENCOUNTER — Encounter: Payer: Self-pay | Admitting: Hematology and Oncology

## 2020-05-26 ENCOUNTER — Other Ambulatory Visit: Payer: Self-pay

## 2020-05-26 ENCOUNTER — Ambulatory Visit: Payer: Medicare Other

## 2020-05-26 VITALS — BP 153/85 | HR 79 | Temp 98.6°F | Resp 18 | Wt 203.5 lb

## 2020-05-26 DIAGNOSIS — Z794 Long term (current) use of insulin: Secondary | ICD-10-CM | POA: Diagnosis not present

## 2020-05-26 DIAGNOSIS — R634 Abnormal weight loss: Secondary | ICD-10-CM | POA: Insufficient documentation

## 2020-05-26 DIAGNOSIS — G4701 Insomnia due to medical condition: Secondary | ICD-10-CM

## 2020-05-26 DIAGNOSIS — E785 Hyperlipidemia, unspecified: Secondary | ICD-10-CM | POA: Insufficient documentation

## 2020-05-26 DIAGNOSIS — N179 Acute kidney failure, unspecified: Secondary | ICD-10-CM | POA: Diagnosis not present

## 2020-05-26 DIAGNOSIS — E119 Type 2 diabetes mellitus without complications: Secondary | ICD-10-CM | POA: Insufficient documentation

## 2020-05-26 DIAGNOSIS — C01 Malignant neoplasm of base of tongue: Secondary | ICD-10-CM

## 2020-05-26 DIAGNOSIS — Z8673 Personal history of transient ischemic attack (TIA), and cerebral infarction without residual deficits: Secondary | ICD-10-CM | POA: Insufficient documentation

## 2020-05-26 DIAGNOSIS — I4891 Unspecified atrial fibrillation: Secondary | ICD-10-CM | POA: Diagnosis not present

## 2020-05-26 DIAGNOSIS — C109 Malignant neoplasm of oropharynx, unspecified: Secondary | ICD-10-CM | POA: Diagnosis not present

## 2020-05-26 DIAGNOSIS — G4700 Insomnia, unspecified: Secondary | ICD-10-CM | POA: Diagnosis not present

## 2020-05-26 DIAGNOSIS — Z79899 Other long term (current) drug therapy: Secondary | ICD-10-CM | POA: Insufficient documentation

## 2020-05-26 DIAGNOSIS — I429 Cardiomyopathy, unspecified: Secondary | ICD-10-CM | POA: Diagnosis not present

## 2020-05-26 DIAGNOSIS — Z931 Gastrostomy status: Secondary | ICD-10-CM | POA: Diagnosis not present

## 2020-05-26 DIAGNOSIS — I10 Essential (primary) hypertension: Secondary | ICD-10-CM | POA: Insufficient documentation

## 2020-05-26 DIAGNOSIS — Z7901 Long term (current) use of anticoagulants: Secondary | ICD-10-CM | POA: Insufficient documentation

## 2020-05-26 DIAGNOSIS — I2510 Atherosclerotic heart disease of native coronary artery without angina pectoris: Secondary | ICD-10-CM | POA: Insufficient documentation

## 2020-05-26 DIAGNOSIS — Z9221 Personal history of antineoplastic chemotherapy: Secondary | ICD-10-CM | POA: Diagnosis not present

## 2020-05-26 NOTE — Assessment & Plan Note (Signed)
Weight loss, improving. He gained about 5 lbs since last visit. Eating very well, doesn't want the G tube. Removal order placed.

## 2020-05-26 NOTE — Assessment & Plan Note (Signed)
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 He initially received one weekly cycle of cisplatin, but given GFR issues, transitioned to weekly carboplatin. He now has received 4 weekly cycles of carboplatin, tolerated it well overall. Last cycle was omitted because of cytopenias and severe fatigue. He is now here for FU after completion of chemo. He is doing remarkably well, feeling better. No residual side effects with chemo. PE today no palpable LN, doing better overall. I recommended FU in one month.

## 2020-05-26 NOTE — Assessment & Plan Note (Signed)
Resolved, no issues currently

## 2020-05-26 NOTE — Progress Notes (Signed)
[facility] NOTE  Patient Care Team: [name], MD as PCP - General (Family Medicine) [name], [name], RN as Oncology Nurse Navigator [name], MD as Consulting Physician (Radiation Oncology) [name], MD as Consulting Physician (Hematology and Oncology) [name], RD as Dietitian (Nutrition) [name], [name], [name] as Speech Language Pathologist (Speech Pathology) [name], [name], PT as Physical Therapist (Physical Therapy) [name], [name] as Social Worker (General Practice) [name], MD as Consulting Physician (Otolaryngology)  CHIEF COMPLAINTS/PURPOSE OF CONSULTATION:   SCC of oropharynx.  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 He initially received one weekly cycle of cisplatin, but given GFR issues, transitioned to weekly carboplatin. He now has received 4 weekly cycles of carboplatin, tolerated it well overall. Last cycle was omitted because of cytopenias and severe fatigue. He is now here for FU after completion of chemo. He is doing remarkably well, feeling better. No residual side effects with chemo. PE today no palpable LN, doing better overall. I recommended FU in one month.  Insomnia Resolved, no issues currently  Weight loss, unintentional Weight loss, improving. He gained about 5 lbs since last visit. Eating very well, doesn't want the G tube. Removal order placed.  Orders Placed This Encounter  Procedures  . IR GASTROSTOMY TUBE REMOVAL    Standing Status:   Future    Standing Expiration Date:   05/26/2021    Order Specific Question:   Reason for Exam (SYMPTOM  OR DIAGNOSIS  REQUIRED)    Answer:   Completed concurrent chemo radiation, if we can get the G tube removed.    Order Specific Question:   Preferred Imaging Location?    Answer:   [place] Sonny Anthes MD   HISTORY OF PRESENTING ILLNESS:   Brian [name] is a 65 y.o. male who presented two-week history of facial swelling in late November/early December 2021. Subsequently, the patient saw Dr. Benjamine Mola, who performed a direct laryngoscopy with biopsy on the date of 02/10/2020. Final pathology revealed: squamous cell carcinoma, P16 positive  He had the following imaging performed  1. Soft tissue neck CT scan performed on 01/28/2020 revealing right glossotonsillar malignancy with bulky bilateral nodal disease. Nodal masses sowed definite extracapsular spread. There was also noted to be incidental advanced cervical spine degeneration with cord compression.  2. PET scan performed on 02/26/2020 revealing a hypermetabolic lesion at the right base of the tongue and right tonsil, consistent with oropharyngeal carcinoma. Carcinoma appeared to be confined to the mucosal surface. There were also noted to bulky bilateral hypermetabolic level II metastatic lymph nodes and smaller inferior hypermetabolic level II metastatic lymph nodes. There was no evidence of metastatic disease in the chest, abdomen, or pelvis. The two small right middle lobe pulmonary nodules present on CT from 2012 were consistent with benign etiology.  Started radiation on 03/14/2020 Chemo delayed by a week due to GFR and infusion issues. Started chemotherapy on 03/21/2020 C2 delayed because of AKI Now switched to weekly carboplatin given ongoing AKI C1 of weekly carboplatin on 04/04/2020 C2 of weekly carboplatin on 04/11/2020 C3 of weekly carboplatin on 04/18/2020 C4 Weekly carboplatin on 04/25/2020 Last planned cycle omitted.  Interim History  He is here for FU after completion of chemoradiation. He is doing well. No complaints  today. He is able to eat better. He gained 5 lbs approximately since his last visit No hearing changes, neuropathy. No change in urination. He is not using the G tube, bowel habits are normal. No cough, chest pain or chest pressure. Rest of the pertinent 10 point ROS reviewed and negative.  REVIEW OF SYSTEMS:    Constitutional: Denies fevers, chills or abnormal night sweats Eyes: Denies blurriness of vision, double vision or watery eyes Ears, nose, mouth, throat, and face: Denies mucositis or sore throat.  He is able to swallow everything Respiratory: Denies cough, dyspnea or wheezes Cardiovascular: Denies palpitation, chest discomfort or lower extremity swelling Gastrointestinal:  Denies nausea, heartburn or change in bowel habits Skin: Denies abnormal skin rashes neurological:Denies numbness, tingling or new weaknesses Behavioral/Psych: Mood is stable, no new changes  All other systems were reviewed with the patient and are negative.  MEDICAL HISTORY:  Past Medical History:  Diagnosis Date  . Arthritis   . Atrial fibrillation ([place])   . Cardiomyopathy   . Cerebrovascular accident ([place])    2012  . Diabetes mellitus   . Hyperlipidemia   . Hypertension   . Left-sided weakness   . 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 Zigmund Daniel, laser therapy OD  . Seizures ([place])    pt reports this is from low blood sugar    SURGICAL HISTORY: Past Surgical History:  Procedure Laterality Date  . DIRECT LARYNGOSCOPY N/A 02/10/2020   Procedure: DIRECT LARYNGOSCOPY WITH BIOPSY;  Surgeon: [name], MD;  Location: [facility];  Service: ENT;  Laterality: N/A;  . Ear surgery as a child    . IR GASTROSTOMY TUBE MOD SED  03/23/2020  . IR IMAGING GUIDED PORT INSERTION  03/09/2020  . TONSILLECTOMY      SOCIAL HISTORY: Social History   Socioeconomic History  . Marital status: Divorced    Spouse name: Not on file  . Number of children: 2  . Years of education: Not on file  . Highest education  level: Not on file  Occupational History  . Occupation: Disabled  Tobacco Use  . Smoking status: Never Smoker  . Smokeless tobacco: Never Used  Vaping Use  . Vaping Use: Never used  Substance and Sexual Activity  . Alcohol use: Yes    Alcohol/week: 0.0 standard drinks    Comment: seldom  . Drug use: Yes    Frequency: 7.0 times per week    Comment: marijauna for arthritis, last 03-13-20  . Sexual activity: Not Currently  Other Topics Concern  . Not on file  Social History Narrative   He has children.   Social Determinants of Health   Financial Resource Strain: Low Risk   . Difficulty of Paying Living Expenses: Not very hard  Food Insecurity: No Food Insecurity  . Worried About Charity fundraiser in the Last Year: Never true  . Ran Out of Food in the Last Year: Never true  Transportation Needs: No Transportation Needs  . Lack of Transportation (Medical): No  . Lack of Transportation (Non-Medical): No  Physical Activity: Not on file  Stress: Stress Concern Present  . Feeling of Stress : To some extent  Social Connections: Socially Isolated  . Frequency of Communication with Friends and Family: More than three times a week  . Frequency of Social Gatherings with Friends and Family: More than three times a week  .  Attends Religious Services: Never  . Active Member of Clubs or Organizations: No  . Attends Archivist Meetings: Never  . Marital Status: Divorced  Human resources officer Violence: Not on file    FAMILY HISTORY: Family History  Problem Relation Age of Onset  . Hyperlipidemia Father   . Hypertension Father   . Coronary artery disease Neg Hx        no early CAD.  Marland Kitchen Colon cancer Neg Hx   . Stomach cancer Neg Hx   . Rectal cancer Neg Hx   . Esophageal cancer Neg Hx     ALLERGIES:  is allergic to codeine.  MEDICATIONS:  Current Outpatient Medications  Medication Sig Dispense Refill  . amLODipine (NORVASC) 10 MG tablet Take 1 tablet (10 mg total) by  mouth daily. 90 tablet 1  . atorvastatin (LIPITOR) 20 MG tablet TAKE 1 TABLET (20 MG TOTAL) DAILY BY MOUTH. 90 tablet 3  . Blood Glucose Monitoring Suppl (ONETOUCH VERIO IQ SYSTEM) w/Device KIT 1 each by Does not apply route 2 (two) times daily. 1 kit 0  . carvedilol (COREG) 25 MG tablet TAKE 1 TABLET BY MOUTH TWICE A DAY WITH A MEAL (Patient taking differently: Take 25 mg by mouth 2 (two) times daily with a meal.) 180 tablet 3  . chlorthalidone (HYGROTON) 25 MG tablet TAKE 1 TABLET BY MOUTH EVERY DAY 90 tablet 1  . cloNIDine (CATAPRES) 0.1 MG tablet Take 1 tablet (0.1 mg total) by mouth 2 (two) times daily. 60 tablet 3  . Continuous Blood Gluc Receiver (FREESTYLE LIBRE 2 READER) DEVI Use as directed to monitor blood glucose continuously. DX E11.65 1 each 1  . Continuous Blood Gluc Sensor (FREESTYLE LIBRE 2 SENSOR) MISC Use as directed to monitor blood glucose continuously. Change sensor Q 14 days. DX E11.65 2 each 11  . furosemide (LASIX) 20 MG tablet TAKE 1 TABLET (20 MG TOTAL) BY MOUTH DAILY AS NEEDED FOR FLUID. 90 tablet 1  . glucose blood test strip 1 each by Other route as needed for other. Use as instructed 100 each 11  . hydrALAZINE (APRESOLINE) 50 MG tablet TAKE 1 TABLET BY MOUTH THREE TIMES A DAY 270 tablet 1  . Insulin Pen Needle (B-D UF III MINI PEN NEEDLES) 31G X 5 MM MISC USE DAILY WITH INSULIN 100 each 5  . Insulin Syringe-Needle U-100 (INSULIN SYRINGE .3CC/31GX5/16") 31G X 5/16" 0.3 ML MISC Use twice daily to inject insulin. 100 each 10  . LEVEMIR FLEXTOUCH 100 UNIT/ML FlexPen INJECT 70 UNITS UNDER THE SKIN EVERY MORNING AND 40 UNITS AT BEDTIME 15 mL 3  . lidocaine (XYLOCAINE) 2 % solution Patient: Mix 1part 2% viscous lidocaine, 1part H20. Swish & swallow 28m of diluted mixture, 345m before meals and at bedtime, up to QID (Patient not taking: Reported on 04/25/2020) 200 mL 4  . lidocaine-prilocaine (EMLA) cream Apply to affected area once (Patient not taking: Reported on 04/25/2020) 30  g 3  . LORazepam (ATIVAN) 0.5 MG tablet Take 1 tablet (0.5 mg total) by mouth every 6 (six) hours as needed (Nausea or vomiting). 30 tablet 0  . losartan (COZAAR) 50 MG tablet Take 1 tablet (50 mg total) by mouth daily. 90 tablet 3  . magnesium oxide (MAG-OX) 400 MG tablet Take 400 mg by mouth daily.    . ondansetron (ZOFRAN) 8 MG tablet Take 1 tablet (8 mg total) by mouth 2 (two) times daily as needed. Start on the third day after cisplatin chemotherapy. (Patient not taking:  Reported on 04/25/2020) 30 tablet 1  . OneTouch Delica Lancets 32I MISC 1 each by Does not apply route 2 (two) times daily. 200 each 11  . pioglitazone (ACTOS) 15 MG tablet Take 1 tablet (15 mg total) by mouth daily. 90 tablet 2  . prochlorperazine (COMPAZINE) 10 MG tablet Take 1 tablet (10 mg total) by mouth every 6 (six) hours as needed (Nausea or vomiting). (Patient not taking: Reported on 04/25/2020) 30 tablet 1  . temazepam (RESTORIL) 22.5 MG capsule Take 1 capsule (22.5 mg total) by mouth at bedtime as needed for sleep. Do not combine with Ambien. 30 capsule 0  . warfarin (COUMADIN) 10 MG tablet Take 1 tablet (10 mg total) by mouth daily. 90 tablet 3  . zolpidem (AMBIEN) 5 MG tablet Take 1 tablet (5 mg total) by mouth at bedtime as needed for sleep. 30 tablet 0   No current facility-administered medications for this visit.     PHYSICAL EXAMINATION: ECOG PERFORMANCE STATUS: 0 - Asymptomatic  Vitals:   05/26/20 1335  BP: (!) 153/85  Pulse: 79  Resp: 18  Temp: 98.6 F (37 C)  SpO2: 100%   Filed Weights   05/26/20 1335  Weight: 203 lb 8 oz (92.3 kg)   Physical Exam Constitutional:      Appearance: Normal appearance.  HENT:     Head: Normocephalic and atraumatic.     Nose: Nose normal.     Mouth/Throat:     Mouth: Mucous membranes are moist.  Cardiovascular:     Rate and Rhythm: Normal rate and regular rhythm.     Pulses: Normal pulses.     Heart sounds: Normal heart sounds.  Pulmonary:     Effort:  Pulmonary effort is normal.     Breath sounds: Normal breath sounds.  Abdominal:     General: Abdomen is flat.     Palpations: Abdomen is soft.  Musculoskeletal:        General: No swelling. Normal range of motion.     Cervical back: Normal range of motion and neck supple.  Lymphadenopathy:     Cervical: Cervical adenopathy (not palpable today. No mucositis noted) present.  Skin:    General: Skin is warm and dry.  Neurological:     General: No focal deficit present.     Mental Status: He is alert and oriented to person, place, and time.  Psychiatric:        Mood and Affect: Mood normal.        Behavior: Behavior normal.      LABORATORY DATA:  I have reviewed the data as listed Lab Results  Component Value Date   WBC 2.1 (L) 05/09/2020   HGB 8.4 (L) 05/09/2020   HCT 24.9 (L) 05/09/2020   MCV 85.0 05/09/2020   PLT 83 (L) 05/09/2020     Chemistry      Component Value Date/Time   NA 128 (L) 05/09/2020 0914   K 4.3 05/09/2020 0914   CL 96 (L) 05/09/2020 0914   CO2 24 05/09/2020 0914   BUN 59 (H) 05/09/2020 0914   CREATININE 2.33 (H) 05/09/2020 0914   CREATININE 1.23 11/30/2019 0915      Component Value Date/Time   CALCIUM 8.7 (L) 05/09/2020 0914   ALKPHOS 51 05/09/2020 0914   AST 10 (L) 05/09/2020 0914   ALT 14 05/09/2020 0914   BILITOT 0.4 05/09/2020 0914       RADIOGRAPHIC STUDIES: I have personally reviewed the radiological images as listed and  agreed with the findings in the report. DG Chest Port 1 View  Result Date: 05/02/2020 CLINICAL DATA:  Sepsis. EXAM: PORTABLE CHEST 1 VIEW COMPARISON:  January 20, 2019. FINDINGS: Stable cardiomediastinal silhouette. No pneumothorax or pleural effusion is noted. Interval placement of right internal jugular Port-A-Cath with distal tip in expected position of the SVC. Both lungs are clear. The visualized skeletal structures are unremarkable. IMPRESSION: No active disease. Electronically Signed   By: Marijo Conception M.D.    On: 05/02/2020 08:56   I reviewed the image myself.  Noted hypermetabolic lesions at the base of tongue on the right side and right tonsil as well as the bilateral bulky lymphadenopathy.  02/10/2020  SURGICAL PATHOLOGY    Reason for Addendum #1: Immunohistochemistry results   Clinical History: throat cancer ([place])    FINAL MICROSCOPIC DIAGNOSIS:   A. OROPHARYNGEAL, RIGHT, BIOPSY:  - Squamous cell carcinoma  - See comment   COMMENT:   P16 immunohistochemistry is pending and will be reported in an addendum.  Dr. Tresa Moore reviewed the case and agrees with the above diagnosis. Dr.  Benjamine Mola was notified of these results on February 11, 2020.   GROSS DESCRIPTION:   The specimen is received in saline and consists of a 1.6 x 1.5 x 0.3 cm  aggregate of tan-pink soft tissue. The specimen is entirely submitted  in 1 cassette. Craig Staggers 02/10/2020)    Final Diagnosis performed by Thressa Sheller, MD.  Electronically signed  02/11/2020  Technical and / or Professional components performed at [facility]. [facility], [place] [address] [place], [place] [address].  Immunohistochemistry Technical component (if applicable) was performed  at [facility]. [address], [place],  [place], [place] [address].  IMMUNOHISTOCHEMISTRY DISCLAIMER (if applicable):  Some of these immunohistochemical stains may have been developed and the  performance characteristics determine by [facility]. Some  may not have been cleared or approved by the U.S. Food and Drug  Administration. The FDA has determined that such clearance or approval  is not necessary. This test is used for clinical purposes. It should not  be regarded as investigational or for research. This laboratory is  certified under the [place]  (CLIA-88) as qualified to perform high complexity clinical laboratory  testing. The controls stained appropriately.    ADDENDUM:   P16 immunohistochemistry is POSITIVE.   Reviewed labs. AKI and low Mg. Will provide Mag replacement. Encouraged oral hydration Will proceed with chemo today.  All questions were answered. The patient knows to call the clinic with any problems, questions or concerns.. I spent 30 minutes in the care of this patient including H and P, review of records, counseling and coordination of care.    [name], MD 05/26/2020 2:42 PM

## 2020-05-27 ENCOUNTER — Ambulatory Visit: Payer: Medicare Other | Attending: Radiation Oncology

## 2020-05-27 DIAGNOSIS — R1310 Dysphagia, unspecified: Secondary | ICD-10-CM | POA: Diagnosis not present

## 2020-05-27 NOTE — Therapy (Signed)
[facility] [address] [place], [name], [address] Phone: [phone]   Fax:  [phone]  Speech Language Pathology Treatment  Patient Details  Name: Brian [name] MRN: [id number] Date of Birth: Apr 19, 1955 Referring Provider (SLP): [name], MD   Encounter Date: 05/27/2020   End of Session - 05/27/20 1501    Visit Number 3    Number of Visits 7    Date for SLP Re-Evaluation 06/22/20    SLP Start Time 1150    SLP Stop Time  3382    SLP Time Calculation (min) 30 min    Activity Tolerance Patient tolerated treatment well           Past Medical History:  Diagnosis Date  . Arthritis   . Atrial fibrillation ([place])   . Cardiomyopathy   . Cerebrovascular accident ([place])    2012  . Diabetes mellitus   . Hyperlipidemia   . Hypertension   . Left-sided weakness   . 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)    Dr. Zigmund Daniel, laser therapy OD  . Seizures ([place])    pt reports this is from low blood sugar    Past Surgical History:  Procedure Laterality Date  . DIRECT LARYNGOSCOPY N/A 02/10/2020   Procedure: DIRECT LARYNGOSCOPY WITH BIOPSY;  Surgeon: [name], MD;  Location: [facility];  Service: ENT;  Laterality: N/A;  . Ear surgery as a child    . IR GASTROSTOMY TUBE MOD SED  03/23/2020  . IR IMAGING GUIDED PORT INSERTION  03/09/2020  . TONSILLECTOMY      There were no vitals filed for this visit.   Subjective Assessment - 05/27/20 1155    Subjective "My taste buds are shot."    Currently in Pain? No/denies                 ADULT SLP TREATMENT - 05/27/20 1156      General Information   Behavior/Cognition Alert;Cooperative;Pleasant mood      Treatment Provided   Treatment provided Dysphagia      Dysphagia Treatment   Temperature Spikes Noted No    Treatment Methods Skilled observation;Therapeutic exercise;Patient/caregiver education    Patient observed directly with PO's Yes    Type of PO's  observed Regular;Thin liquids    Oral Phase Signs & Symptoms Other (comment)   no overt s/sx   Pharyngeal Phase Signs & Symptoms Other (comment)   no overt s/sx   Other treatment/comments Pt had sausage egg and cheese sandwich this morning for breakfast. Pt did not know how to complete HEP without SLP mod cues. SLP reiterated to pt that BID HEP completion was necessary for best chance at maintaining swallowing at [facility].SLP provided max A for rationale for HEP as pt stated he was swallowing WNL currently.      Assessment / Recommendations / Plan   Plan Continue with current plan of care      Dysphagia Recommendations   Diet recommendations --   as tolerated   Medication Administration --   as tolerated     Progression Toward Goals   Progression toward goals Not progressing toward goals (comment)   suboptimal/no completion of HEP           SLP Education - 05/27/20 1500    Education Details late effects head/neck radiation on swallow function, HEP procedure    Person(s) Educated Patient    Methods Explanation;Demonstration;Verbal cues;Handout    Comprehension Verbalized understanding;Returned demonstration;Verbal cues required;Need further instruction  SLP Short Term Goals - 05/27/20 1514      SLP SHORT TERM GOAL #1   Title Pt will complete HEP with rare min A over two sessions    Time 1    Period --   visits, for all STGs   Status On-going      SLP SHORT TERM GOAL #2   Title Pt will tell SLP rationale for HEP completion    Status Not Met      SLP SHORT TERM GOAL #3   Title Pt will tell SLP how a food journal can hasten/facilitate return to more normalized diet    Status Deferred            SLP Long Term Goals - 05/27/20 1515      SLP LONG TERM GOAL #1   Title Pt will complete HEP with rare min A over 3 visits    Time 3    Period --   visits, for all LTGs   Status Revised      SLP LONG TERM GOAL #2   Title Pt will tell SLP 3 overt s/s aspiration PNA with  modified [place] in 2 sessions    Time 2    Status On-going      SLP LONG TERM GOAL #3   Title Pt will tell SLP when to decr frequency of HEP with modified independence    Time 4    Status Revised            Plan - 05/27/20 1502    Clinical Impression Statement Shyloh presents today with WNL/WFL swallowing ability with peanut better crackersand water .  No overt s/sx oral or pharyngeal difficulty, or of aspiration PNA reported or observed today. Data suggests that as pts progress through rad or chemorad therapy that their swallowing ability will decrease. Also, WNL swallowing is threatened by muscle fibrosis that will likely develop after rad/chemorad is completed. See "other comments" for more details of today's session. SLP reviewed his individualized exercise program. ST is still necessary for addressing need for objective swallow assessment, as well as to assess accurate completion of swallowing HEP. If pt continues to demonstrate limited committment to completing HEP and remains safe with POs, he may be d/c'd next session or next 2 sessions.    Speech Therapy Frequency --   once approx every 4 weeks   Duration --   7 total visits   Treatment/Interventions Aspiration precaution training;Pharyngeal strengthening exercises;Diet toleration management by SLP;Trials of upgraded texture/liquids;Cueing hierarchy;SLP instruction and feedback;Patient/family education;Functional tasks;Other (comment)    Potential to Achieve Goals Good    SLP Home Exercise Plan provided today    Consulted and Agree with Plan of Care Patient           Patient will benefit from skilled therapeutic intervention in order to improve the following deficits and impairments:   Dysphagia, unspecified type    Problem List Patient Active Problem List   Diagnosis Date Noted  . Weight loss, unintentional 05/26/2020  . Insomnia 05/09/2020  . CKD (chronic kidney disease) 05/09/2020  . Chemotherapy induced diarrhea  05/09/2020  . Malignant neoplasm of base of tongue ([place]) 03/02/2020  . 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 Status epilepticus ([place]) 01/18/2013  . 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 status epilepticus, not intractable ([place]) 01/15/2013  . Hyperlipidemia   . Long term current use of anticoagulant 03/11/2010  . Diabetes mellitus, type II ([place]) 12/20/2009  . Essential hypertension, benign 12/20/2009  . Atrial fibrillation (  [place]) 12/20/2009  . CONGESTIVE HEART FAILURE, SYSTOLIC DYSFUNCTION 16/11/9602  . CEREBROVASCULAR ACCIDENT 12/20/2009    [facility] ,[place], [place]  05/27/2020, 3:16 PM  [facility] [address] [place], [name], [address] Phone: [phone]   Fax:  [phone]   Name: KINNIE KAUPP MRN: [id number] Date of Birth: 26-May-1955

## 2020-05-31 ENCOUNTER — Telehealth: Payer: Self-pay | Admitting: Family Medicine

## 2020-05-31 NOTE — Progress Notes (Signed)
  Chronic Care Management   Outreach Note  05/31/2020 Name: Brian [name] MRN: [id number] DOB: 1955/12/31  Referred by: [name], MD Reason for referral : No chief complaint on file.   An unsuccessful telephone outreach was attempted today. The patient was referred to the pharmacist for assistance with care management and care coordination.   Follow Up Plan:   Carley Perdue [employer] Scheduler

## 2020-06-01 ENCOUNTER — Other Ambulatory Visit: Payer: Self-pay

## 2020-06-01 ENCOUNTER — Ambulatory Visit (HOSPITAL_COMMUNITY)
Admission: RE | Admit: 2020-06-01 | Discharge: 2020-06-01 | Disposition: A | Discharge status: Home / Self Care | Payer: Medicare Other | Source: Ambulatory Visit | Attending: Hematology and Oncology | Admitting: Hematology and Oncology

## 2020-06-01 DIAGNOSIS — Z431 Encounter for attention to gastrostomy: Secondary | ICD-10-CM | POA: Insufficient documentation

## 2020-06-01 DIAGNOSIS — C01 Malignant neoplasm of base of tongue: Secondary | ICD-10-CM

## 2020-06-01 DIAGNOSIS — C109 Malignant neoplasm of oropharynx, unspecified: Secondary | ICD-10-CM | POA: Insufficient documentation

## 2020-06-01 HISTORY — PX: IR GASTROSTOMY TUBE REMOVAL: IMG5492

## 2020-06-01 MED ORDER — LIDOCAINE VISCOUS HCL 2 % MT SOLN
OROMUCOSAL | Status: AC
  Administered 2020-06-01: 4 mL
  Filled 2020-06-01: qty 15

## 2020-06-01 NOTE — Procedures (Signed)
Patient's 58 French balloon retention gastrostomy tube was removed in its entirety without immediate complications.  Gauze dressing applied over site.  Site care instructions given to patient. EBL none.

## 2020-06-05 DIAGNOSIS — Z743 Need for continuous supervision: Secondary | ICD-10-CM | POA: Diagnosis not present

## 2020-06-05 DIAGNOSIS — E161 Other hypoglycemia: Secondary | ICD-10-CM | POA: Diagnosis not present

## 2020-06-05 DIAGNOSIS — R404 Transient alteration of awareness: Secondary | ICD-10-CM | POA: Diagnosis not present

## 2020-06-05 DIAGNOSIS — E162 Hypoglycemia, unspecified: Secondary | ICD-10-CM | POA: Diagnosis not present

## 2020-06-05 DIAGNOSIS — R4020 Unspecified coma: Secondary | ICD-10-CM | POA: Diagnosis not present

## 2020-06-13 ENCOUNTER — Telehealth: Payer: Self-pay | Admitting: Family Medicine

## 2020-06-13 NOTE — Progress Notes (Signed)
  Chronic Care Management   Note  06/13/2020 Name: Brian [name] MRN: [id number] DOB: August 31, 1955  Brian [name] is a 65 y.o. year old male who is a primary care patient of [name], MD. I reached out to Brian [name] by phone today in response to a referral sent by Mr. Samnang Shugars Storti's PCP, [name], MD.   Mr. Morss was given information about Chronic Care Management services today including:  1. CCM service includes personalized support from designated clinical staff supervised by his physician, including individualized plan of care and coordination with other care providers 2. 24/7 contact phone numbers for assistance for urgent and routine care needs. 3. Service will only be billed when office clinical staff spend 20 minutes or more in a month to coordinate care. 4. Only one practitioner may furnish and bill the service in a calendar month. 5. The patient may stop CCM services at any time (effective at the end of the month) by phone call to the office staff.   Patient agreed to services and verbal consent obtained.   Follow up plan:   Carley Perdue [employer] Scheduler

## 2020-06-21 LAB — HM DIABETES EYE EXAM

## 2020-06-23 ENCOUNTER — Telehealth: Payer: Self-pay

## 2020-06-23 ENCOUNTER — Inpatient Hospital Stay: Payer: Medicare Other | Attending: Hematology and Oncology | Admitting: Hematology and Oncology

## 2020-06-23 ENCOUNTER — Other Ambulatory Visit: Payer: Self-pay

## 2020-06-23 DIAGNOSIS — C109 Malignant neoplasm of oropharynx, unspecified: Secondary | ICD-10-CM | POA: Insufficient documentation

## 2020-06-23 DIAGNOSIS — C01 Malignant neoplasm of base of tongue: Secondary | ICD-10-CM

## 2020-06-23 NOTE — Progress Notes (Signed)
                                                                                                                                                            Patient Name: Brian [name] MRN: [id number] DOB: 1955/03/16 Referring Physician: Benjamine Mola SUI (Profile Not Attached) Date of Service: 05/04/2020 [facility], [place]                                                        End Of Treatment Note  Diagnoses: C01-Malignant neoplasm of base of tongue C10.8-Malignant neoplasm of overlapping sites of oropharynx  Cancer Staging: Cancer Staging Malignant neoplasm of base of tongue ([place]) Staging form: Pharynx - HPV-Mediated Oropharynx, AJCC 8th Edition - Clinical stage from 03/01/2020: Stage III (cT2, cN3, cM0, p16+) - Signed by [name], MD on 03/02/2020 Stage prefix: Initial diagnosis   Intent: Curative  Radiation Treatment Dates: 03/14/2020 through 05/04/2020 Site Technique Total Dose (Gy) Dose per Fx (Gy) Completed Fx Beam Energies  Neck: HN_BOT IMRT 70/70 2 35/35 6X   Narrative: The patient tolerated radiation therapy relatively well.   At the end of his course, he was seen in the ED with hypotension - looked presyncopal in a restaurant and was sent to ED. Felt better after IV fluids and discharged home.  Plan: The patient will follow-up with radiation oncology in 2-3wks . -----------------------------------  [name], MD

## 2020-06-23 NOTE — Progress Notes (Signed)
[facility] [facility] NOTE  Patient Care Team: [name], MD as PCP - General (Family Medicine) [name], [name], RN as Oncology Nurse Navigator [name], MD as Consulting Physician (Radiation Oncology) [name], MD as Consulting Physician (Hematology and Oncology) [name], RD as Dietitian (Nutrition) [name], [name], [name] as Speech Language Pathologist (Speech Pathology) [name], [name], PT as Physical Therapist (Physical Therapy) [name], [name] as Social Worker (General Practice) [name], MD as Consulting Physician (Otolaryngology) [name], [facility] as Pharmacist (Pharmacist)  CHIEF COMPLAINTS/PURPOSE OF CONSULTATION:   SCC of oropharynx.  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 He initially received one weekly cycle of cisplatin, but given GFR issues, transitioned to weekly carboplatin. He completed 4 weekly cycles of carboplatin. Last cycle was omitted because of cytopenias and severe fatigue. He is now here for FU after completion of chemo. He is doing remarkably well, feeling better. No residual side effects with chemo. PE today no palpable LN, doing better overall. I recommended FU in July after PET scan. We will get the port removed if PET scan is clear.    No orders of the defined types were placed in this encounter.   [name] MD   HISTORY OF PRESENTING ILLNESS:   Oncology History Overview Note  Brian [name] is a 65 y.o. male who presented two-week history of facial swelling in late November/early December 2021. Subsequently, the patient saw Dr. Benjamine Mola, who performed a direct  laryngoscopy with biopsy on the date of 02/10/2020. Final pathology revealed: squamous cell carcinoma, P16 positive   He had the following imaging performed  1. Soft tissue neck CT scan performed on 01/28/2020 revealing right glossotonsillar malignancy with bulky bilateral nodal disease. Nodal masses sowed definite extracapsular spread. There was also noted to be incidental advanced cervical spine degeneration with cord compression.  2. PET scan performed on 02/26/2020 revealing a hypermetabolic lesion at the right base of the tongue and right tonsil, consistent with oropharyngeal carcinoma. Carcinoma appeared to be confined to the mucosal surface. There were also noted to bulky bilateral hypermetabolic level II metastatic lymph nodes and smaller inferior hypermetabolic level II metastatic lymph nodes. There was no evidence of metastatic disease in the chest, abdomen, or pelvis. The two small right middle lobe pulmonary nodules present on CT from 2012 were consistent with benign etiology.  Started radiation on 03/14/2020 Chemo delayed by a week due to GFR and infusion issues. Started chemotherapy on 03/21/2020 C2 delayed because of AKI Now switched to weekly carboplatin given ongoing AKI C1 of weekly carboplatin on 04/04/2020 C2 of weekly carboplatin on 04/11/2020 C3 of weekly carboplatin on 04/18/2020 C4 Weekly carboplatin on 04/25/2020 Last planned cycle omitted.   Malignant neoplasm of base of tongue ([place])  03/01/2020 Cancer Staging   Staging form: Pharynx - HPV-Mediated Oropharynx, AJCC 8th Edition - Clinical stage from 03/01/2020: Stage III (cT2, cN3, cM0, p16+) - Signed by [name], MD on 03/02/2020   03/02/2020 Initial Diagnosis   Malignant neoplasm of base of tongue (Brian [name])   03/21/2020 - 03/28/2020 Chemotherapy         04/04/2020 -  Chemotherapy    Patient is on Treatment Plan: HEAD/NECK CARBOPLATIN Q7D  Interim History  He is here for FU, doing great. No complaints. He is  eating well, gaining weight. No pain or sore throat. No neuropathy, hearing changes reported. Energy is better No change in bowel or urinary habits. Rest of the pertinent 10 point ROS reviewed and negative.  MEDICAL HISTORY:  Past Medical History:  Diagnosis Date  . Arthritis   . Atrial fibrillation ([place])   . Cardiomyopathy   . Cerebrovascular accident ([place])    2012  . Diabetes mellitus   . Hyperlipidemia   . Hypertension   . Left-sided weakness   . 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 Zigmund Daniel, laser therapy OD  . Seizures ([place])    pt reports this is from low blood sugar    SURGICAL HISTORY: Past Surgical History:  Procedure Laterality Date  . DIRECT LARYNGOSCOPY N/A 02/10/2020   Procedure: DIRECT LARYNGOSCOPY WITH BIOPSY;  Surgeon: [name], MD;  Location: [facility];  Service: ENT;  Laterality: N/A;  . Ear surgery as a child    . IR GASTROSTOMY TUBE MOD SED  03/23/2020  . IR GASTROSTOMY TUBE REMOVAL  06/01/2020  . IR IMAGING GUIDED PORT INSERTION  03/09/2020  . TONSILLECTOMY      SOCIAL HISTORY: Social History   Socioeconomic History  . Marital status: Divorced    Spouse name: Not on file  . Number of children: 2  . Years of education: Not on file  . Highest education level: Not on file  Occupational History  . Occupation: Disabled  Tobacco Use  . Smoking status: Never Smoker  . Smokeless tobacco: Never Used  Vaping Use  . Vaping Use: Never used  Substance and Sexual Activity  . Alcohol use: Yes    Alcohol/week: 0.0 standard drinks    Comment: seldom  . Drug use: Yes    Frequency: 7.0 times per week    Comment: marijauna for arthritis, last 03-13-20  . Sexual activity: Not Currently  Other Topics Concern  . Not on file  Social History Narrative   He has children.   Social Determinants of Health   Financial Resource Strain: Low Risk   . Difficulty of Paying Living Expenses: Not very hard  Food Insecurity: No Food Insecurity  . Worried About  Charity fundraiser in the Last Year: Never true  . Ran Out of Food in the Last Year: Never true  Transportation Needs: No Transportation Needs  . Lack of Transportation (Medical): No  . Lack of Transportation (Non-Medical): No  Physical Activity: Not on file  Stress: Stress Concern Present  . Feeling of Stress : To some extent  Social Connections: Socially Isolated  . Frequency of Communication with Friends and Family: More than three times a week  . Frequency of Social Gatherings with Friends and Family: More than three times a week  . Attends Religious Services: Never  . Active Member of Clubs or Organizations: No  . Attends Archivist Meetings: Never  . Marital Status: Divorced  Human resources officer Violence: Not on file    FAMILY HISTORY: Family History  Problem Relation Age of Onset  . Hyperlipidemia Father   . Hypertension Father   . Coronary artery disease Neg Hx        no early CAD.  Marland Kitchen Colon cancer Neg Hx   . Stomach cancer Neg Hx   . Rectal cancer Neg Hx   . Esophageal cancer Neg Hx     ALLERGIES:  is allergic to  codeine.  MEDICATIONS:  Current Outpatient Medications  Medication Sig Dispense Refill  . amLODipine (NORVASC) 10 MG tablet Take 1 tablet (10 mg total) by mouth daily. 90 tablet 1  . atorvastatin (LIPITOR) 20 MG tablet TAKE 1 TABLET (20 MG TOTAL) DAILY BY MOUTH. 90 tablet 3  . Blood Glucose Monitoring Suppl (ONETOUCH VERIO IQ SYSTEM) w/Device KIT 1 each by Does not apply route 2 (two) times daily. 1 kit 0  . carvedilol (COREG) 25 MG tablet TAKE 1 TABLET BY MOUTH TWICE A DAY WITH A MEAL (Patient taking differently: Take 25 mg by mouth 2 (two) times daily with a meal.) 180 tablet 3  . chlorthalidone (HYGROTON) 25 MG tablet TAKE 1 TABLET BY MOUTH EVERY DAY 90 tablet 1  . cloNIDine (CATAPRES) 0.1 MG tablet Take 1 tablet (0.1 mg total) by mouth 2 (two) times daily. 60 tablet 3  . Continuous Blood Gluc Receiver (FREESTYLE LIBRE 2 READER) DEVI Use as  directed to monitor blood glucose continuously. DX E11.65 1 each 1  . Continuous Blood Gluc Sensor (FREESTYLE LIBRE 2 SENSOR) MISC Use as directed to monitor blood glucose continuously. Change sensor Q 14 days. DX E11.65 2 each 11  . furosemide (LASIX) 20 MG tablet TAKE 1 TABLET (20 MG TOTAL) BY MOUTH DAILY AS NEEDED FOR FLUID. 90 tablet 1  . glucose blood test strip 1 each by Other route as needed for other. Use as instructed 100 each 11  . hydrALAZINE (APRESOLINE) 50 MG tablet TAKE 1 TABLET BY MOUTH THREE TIMES A DAY 270 tablet 1  . Insulin Pen Needle (B-D UF III MINI PEN NEEDLES) 31G X 5 MM MISC USE DAILY WITH INSULIN 100 each 5  . Insulin Syringe-Needle U-100 (INSULIN SYRINGE .3CC/31GX5/16") 31G X 5/16" 0.3 ML MISC Use twice daily to inject insulin. 100 each 10  . LEVEMIR FLEXTOUCH 100 UNIT/ML FlexPen INJECT 70 UNITS UNDER THE SKIN EVERY MORNING AND 40 UNITS AT BEDTIME 15 mL 3  . LORazepam (ATIVAN) 0.5 MG tablet Take 1 tablet (0.5 mg total) by mouth every 6 (six) hours as needed (Nausea or vomiting). 30 tablet 0  . losartan (COZAAR) 50 MG tablet Take 1 tablet (50 mg total) by mouth daily. 90 tablet 3  . magnesium oxide (MAG-OX) 400 MG tablet Take 400 mg by mouth daily.    Glory Rosebush Delica Lancets 11B MISC 1 each by Does not apply route 2 (two) times daily. 200 each 11  . pioglitazone (ACTOS) 15 MG tablet Take 1 tablet (15 mg total) by mouth daily. 90 tablet 2  . warfarin (COUMADIN) 10 MG tablet Take 1 tablet (10 mg total) by mouth daily. 90 tablet 3   No current facility-administered medications for this visit.     PHYSICAL EXAMINATION: ECOG PERFORMANCE STATUS: 0 - Asymptomatic  Vitals:   06/23/20 1300  BP: (!) 156/100  Pulse: 80  Resp: 20  Temp: 98.6 F (37 C)  SpO2: 100%   Filed Weights   06/23/20 1300  Weight: 206 lb 12.8 oz (93.8 kg)     Physical Exam Constitutional:      Appearance: Normal appearance.  HENT:     Head: Normocephalic and atraumatic.     Nose: Nose  normal.     Mouth/Throat:     Mouth: Mucous membranes are moist.  Cardiovascular:     Rate and Rhythm: Normal rate and regular rhythm.     Pulses: Normal pulses.     Heart sounds: Normal heart sounds.  Pulmonary:  Effort: Pulmonary effort is normal.     Breath sounds: Normal breath sounds.  Abdominal:     General: Abdomen is flat.     Palpations: Abdomen is soft.  Musculoskeletal:        General: No swelling. Normal range of motion.     Cervical back: Normal range of motion and neck supple.  Lymphadenopathy:     Cervical: Cervical adenopathy (not palpable today. No mucositis noted) present.  Skin:    General: Skin is warm and dry.  Neurological:     General: No focal deficit present.     Mental Status: He is alert and oriented to person, place, and time.  Psychiatric:        Mood and Affect: Mood normal.        Behavior: Behavior normal.      LABORATORY DATA:  I have reviewed the data as listed Lab Results  Component Value Date   WBC 2.1 (L) 05/09/2020   HGB 8.4 (L) 05/09/2020   HCT 24.9 (L) 05/09/2020   MCV 85.0 05/09/2020   PLT 83 (L) 05/09/2020     Chemistry      Component Value Date/Time   NA 128 (L) 05/09/2020 0914   K 4.3 05/09/2020 0914   CL 96 (L) 05/09/2020 0914   CO2 24 05/09/2020 0914   BUN 59 (H) 05/09/2020 0914   CREATININE 2.33 (H) 05/09/2020 0914   CREATININE 1.23 11/30/2019 0915      Component Value Date/Time   CALCIUM 8.7 (L) 05/09/2020 0914   ALKPHOS 51 05/09/2020 0914   AST 10 (L) 05/09/2020 0914   ALT 14 05/09/2020 0914   BILITOT 0.4 05/09/2020 0914       RADIOGRAPHIC STUDIES: I have personally reviewed the radiological images as listed and agreed with the findings in the report. IR GASTROSTOMY TUBE REMOVAL  Result Date: 06/01/2020 INDICATION: Patient with history of oropharyngeal cancer and placement of gastrostomy tube on 03/23/2020. He is currently eating a regular diet and request now received for gastrostomy tube removal.  EXAM: GASTROSTOMY TUBE REMOVAL MEDICATIONS: None ANESTHESIA/SEDATION: None CONTRAST:  None FLUOROSCOPY TIME:  None COMPLICATIONS: None immediate. PROCEDURE: Informed consent was obtained from the patient after a thorough discussion of the procedural risks, benefits and alternatives. All questions were addressed. A timeout was performed prior to the initiation of the procedure. The gastrostomy tube balloon was deflated and tube was removed in its entirety without immediate complications. Gauze dressing applied over site. Site care instructions given to patient. IMPRESSION: Successful removal of balloon retention gastrostomy tube as discussed above. Read by: Rowe Robert, PA-C Electronically Signed   By: Miachel Roux M.D.   On: 06/01/2020 14:47    All questions were answered. The patient knows to call the clinic with any problems, questions or concerns.. I spent 20 minutes in the care of this patient including H and P, review of records, counseling and coordination of care.    [name], MD 06/23/2020 1:16 PM

## 2020-06-23 NOTE — Telephone Encounter (Signed)
Left message for patient regarding today's appointment with Dr. Chryl Heck at 12:20. Patient provided with [place] number to call to re-schedule appointment, if needed. Dr. Chryl Heck made aware.

## 2020-06-23 NOTE — Assessment & Plan Note (Signed)
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 He initially received one weekly cycle of cisplatin, but given GFR issues, transitioned to weekly carboplatin. He completed 4 weekly cycles of carboplatin. Last cycle was omitted because of cytopenias and severe fatigue. He is now here for FU after completion of chemo. He is doing remarkably well, feeling better. No residual side effects with chemo. PE today no palpable LN, doing better overall. I recommended FU in July after PET scan. We will get the port removed if PET scan is clear.

## 2020-06-24 ENCOUNTER — Other Ambulatory Visit: Payer: Self-pay

## 2020-06-24 ENCOUNTER — Ambulatory Visit (INDEPENDENT_AMBULATORY_CARE_PROVIDER_SITE_OTHER): Payer: Medicare Other | Admitting: Family Medicine

## 2020-06-24 ENCOUNTER — Encounter: Payer: Self-pay | Admitting: Family Medicine

## 2020-06-24 VITALS — BP 152/92 | HR 82 | Temp 98.2°F | Resp 18 | Ht 69.0 in | Wt 206.0 lb

## 2020-06-24 DIAGNOSIS — E1169 Type 2 diabetes mellitus with other specified complication: Secondary | ICD-10-CM

## 2020-06-24 DIAGNOSIS — E669 Obesity, unspecified: Secondary | ICD-10-CM

## 2020-06-24 DIAGNOSIS — I4891 Unspecified atrial fibrillation: Secondary | ICD-10-CM

## 2020-06-24 LAB — PT WITH INR/FINGERSTICK
INR, fingerstick: 2.8 ratio — ABNORMAL HIGH
PT, fingerstick: 33.3 s — ABNORMAL HIGH (ref 10.5–13.1)

## 2020-06-24 NOTE — Progress Notes (Signed)
Subjective:    Patient ID: Brian [name], male    DOB: 07/10/1955, 65 y.o.   MRN: [id number]  05/13/20 Patient seems very pessimistic and sad today.  He states that he is tired of seeing doctors.  He states he is sick of seeing [name].  He had to go to the hospital for hypotension recently.  I believe that due to weight loss, his blood pressures have been dropping.  His INR today is supratherapeutic at 3.8.  He is currently taking Coumadin 10 mg every day except Monday and Fridays when he takes 5 mg.  He denies any bleeding or bruising.  I directly asked him if he feels depressed.  We discussed some options to treat anxiety and depression however he states he does not want any more medication.  He is sick of taking medication.  He is very tearful on exam today.  He also seems very frustrated.  At that time, my plan was: INR supratherapeutic.  Hold Coumadin.  Restart Coumadin tomorrow.  Take 5 mg on Monday, Wednesday, Friday, Saturday.  Take 10 mg on Tuesday, Thursday, Sunday.  Recheck INR in 4 weeks.  Recommend he check his blood pressure every day and hold clonidine if blood pressures less than 130/80.  I believe that based on his hydration and oral intake he may need to periodically hold his blood pressure medication however today its normal and slightly elevated  06/24/20 Taking 10 mg everyday for the last 3 weeks.  He decided to go back to his previous dose.  Despite changing his Coumadin dose, his INR today is excellent at 2.8.  He seems to be in much better spirits this time.  He got a good report from his cancer doctor yesterday.  He has completed his chemo and there is no palpable lymphadenopathy in his neck.  He is scheduled for a PET scan in July with plans of possibly removing his port if there is no activity seen on the PET scan.  This obviously has his spirits Brian [name].  His blood pressure is high this afternoon and seems to have gone up since he has gained some weight and is eating better.   However he states at home that his systolic blood pressure is in the 1 30-1 40 range with sounds well-controlled.  He is due to recheck his A1c today Wt Readings from Last 3 Encounters:  06/24/20 206 lb (93.4 kg)  06/23/20 206 lb 12.8 oz (93.8 kg)  05/26/20 203 lb 8 oz (92.3 kg)    Past Surgical History:  Procedure Laterality Date  . DIRECT LARYNGOSCOPY N/A 02/10/2020   Procedure: DIRECT LARYNGOSCOPY WITH BIOPSY;  Surgeon: [name], MD;  Location: [place];  Service: ENT;  Laterality: N/A;  . Ear surgery as a child    . IR GASTROSTOMY TUBE MOD SED  03/23/2020  . IR GASTROSTOMY TUBE REMOVAL  06/01/2020  . IR IMAGING GUIDED PORT INSERTION  03/09/2020  . TONSILLECTOMY     Current Outpatient Medications on File Prior to Visit  Medication Sig Dispense Refill  . amLODipine (NORVASC) 10 MG tablet Take 1 tablet (10 mg total) by mouth daily. 90 tablet 1  . atorvastatin (LIPITOR) 20 MG tablet TAKE 1 TABLET (20 MG TOTAL) DAILY BY MOUTH. 90 tablet 3  . Blood Glucose Monitoring Suppl (ONETOUCH VERIO IQ SYSTEM) w/Device KIT 1 each by Does not apply route 2 (two) times daily. 1 kit 0  . carvedilol (COREG) 25 MG tablet TAKE 1 TABLET  BY MOUTH TWICE A DAY WITH A MEAL (Patient taking differently: Take 25 mg by mouth 2 (two) times daily with a meal.) 180 tablet 3  . chlorthalidone (HYGROTON) 25 MG tablet TAKE 1 TABLET BY MOUTH EVERY DAY 90 tablet 1  . cloNIDine (CATAPRES) 0.1 MG tablet Take 1 tablet (0.1 mg total) by mouth 2 (two) times daily. 60 tablet 3  . Continuous Blood Gluc Receiver (FREESTYLE LIBRE 2 READER) DEVI Use as directed to monitor blood glucose continuously. DX E11.65 1 each 1  . Continuous Blood Gluc Sensor (FREESTYLE LIBRE 2 SENSOR) MISC Use as directed to monitor blood glucose continuously. Change sensor Q 14 days. DX E11.65 2 each 11  . furosemide (LASIX) 20 MG tablet TAKE 1 TABLET (20 MG TOTAL) BY MOUTH DAILY AS NEEDED FOR FLUID. 90 tablet 1  . glucose blood test strip 1 each by Other route as  needed for other. Use as instructed 100 each 11  . hydrALAZINE (APRESOLINE) 50 MG tablet TAKE 1 TABLET BY MOUTH THREE TIMES A DAY 270 tablet 1  . Insulin Pen Needle (B-D UF III MINI PEN NEEDLES) 31G X 5 MM MISC USE DAILY WITH INSULIN 100 each 5  . Insulin Syringe-Needle U-100 (INSULIN SYRINGE .3CC/31GX5/16") 31G X 5/16" 0.3 ML MISC Use twice daily to inject insulin. 100 each 10  . LEVEMIR FLEXTOUCH 100 UNIT/ML FlexPen INJECT 70 UNITS UNDER THE SKIN EVERY MORNING AND 40 UNITS AT BEDTIME 15 mL 3  . LORazepam (ATIVAN) 0.5 MG tablet Take 1 tablet (0.5 mg total) by mouth every 6 (six) hours as needed (Nausea or vomiting). 30 tablet 0  . losartan (COZAAR) 50 MG tablet Take 1 tablet (50 mg total) by mouth daily. 90 tablet 3  . magnesium oxide (MAG-OX) 400 MG tablet Take 400 mg by mouth daily.    Glory Rosebush Delica Lancets 54M MISC 1 each by Does not apply route 2 (two) times daily. 200 each 11  . pioglitazone (ACTOS) 15 MG tablet Take 1 tablet (15 mg total) by mouth daily. 90 tablet 2  . warfarin (COUMADIN) 10 MG tablet Take 1 tablet (10 mg total) by mouth daily. 90 tablet 3   No current facility-administered medications on file prior to visit.   Allergies  Allergen Reactions  . Codeine Other (See Comments)    Unknown reaction, severe headach   Social History   Socioeconomic History  . Marital status: Divorced    Spouse name: Not on file  . Number of children: 2  . Years of education: Not on file  . Highest education level: Not on file  Occupational History  . Occupation: Disabled  Tobacco Use  . Smoking status: Never Smoker  . Smokeless tobacco: Never Used  Vaping Use  . Vaping Use: Never used  Substance and Sexual Activity  . Alcohol use: Yes    Alcohol/week: 0.0 standard drinks    Comment: seldom  . Drug use: Yes    Frequency: 7.0 times per week    Comment: marijauna for arthritis, last 03-13-20  . Sexual activity: Not Currently  Other Topics Concern  . Not on file  Social  History Narrative   He has children.   Social Determinants of Health   Financial Resource Strain: Low Risk   . Difficulty of Paying Living Expenses: Not very hard  Food Insecurity: No Food Insecurity  . Worried About Charity fundraiser in the Last Year: Never true  . Ran Out of Food in the Last Year: Never  true  Transportation Needs: No Transportation Needs  . Lack of Transportation (Medical): No  . Lack of Transportation (Non-Medical): No  Physical Activity: Not on file  Stress: Stress Concern Present  . Feeling of Stress : To some extent  Social Connections: Socially Isolated  . Frequency of Communication with Friends and Family: More than three times a week  . Frequency of Social Gatherings with Friends and Family: More than three times a week  . Attends Religious Services: Never  . Active Member of Clubs or Organizations: No  . Attends Archivist Meetings: Never  . Marital Status: Divorced  Human resources officer Violence: Not on file    Review of Systems  All other systems reviewed and are negative.      Objective:   Physical Exam Vitals reviewed.  Cardiovascular:     Rate and Rhythm: Normal rate and regular rhythm.     Heart sounds: Normal heart sounds.  Pulmonary:     Effort: Pulmonary effort is normal. No respiratory distress.     Breath sounds: Normal breath sounds. No wheezing or rales.  Chest:     Chest wall: No tenderness.  Abdominal:     General: Bowel sounds are normal. There is no distension.     Palpations: Abdomen is soft.     Tenderness: There is no abdominal tenderness. There is no rebound.  Musculoskeletal:     Right lower leg: No edema.     Left lower leg: No edema.           Assessment & Plan:   Type 2 diabetes mellitus with obesity ([place]) - Plan: Hemoglobin A1c  Atrial fibrillation, unspecified type ([place]) - Plan: PT with INR/Fingerstick  I am very happy for the patient regarding his cancer prognosis.  I wished him luck on his  PET scan in July.  His INR today is therapeutic so I would recheck this in 6 weeks.  Continue 10 mg of Coumadin every day.  I will get his A1c today.  His blood pressure here is elevated however his home blood pressure sound well controlled.  I like to see his A1c below 7 however the patient is hesitant to add any additional medicine at this time.

## 2020-06-25 LAB — HEMOGLOBIN A1C
Hgb A1c MFr Bld: 7.9 % of total Hgb — ABNORMAL HIGH (ref <5.7)
Mean Plasma Glucose: 180 mg/dL
eAG (mmol/L): 10 mmol/L

## 2020-06-29 NOTE — Progress Notes (Signed)
Right eye not assessable due to possible Miotic and media opacity

## 2020-07-04 ENCOUNTER — Ambulatory Visit: Payer: Medicare Other | Attending: Radiation Oncology | Admitting: Speech Pathology

## 2020-07-08 ENCOUNTER — Other Ambulatory Visit (HOSPITAL_COMMUNITY): Payer: Medicare Other | Admitting: Dentistry

## 2020-07-28 ENCOUNTER — Telehealth: Payer: Self-pay | Admitting: Pharmacist

## 2020-07-28 NOTE — Progress Notes (Addendum)
Chronic Care Management Pharmacy Assistant   Name: Brian [name]  MRN: [id number] DOB: 1955/09/12  Brian [name] is an 65 y.o. year old male who presents for his initial CCM visit with the clinical pharmacist.  Reason for Encounter: Chart Prep   Conditions to be addressed/monitored: HTN, Afib, CHF, Type 2 DM, CKD, Insomnia, HLD  Primary concerns for visit include: HTN, Type 2 DM.  Recent office visits:  06/24/20 Dr. Dennard Schaumann For Type 2 DM. No medication changes.  05/13/20 Dr. Dennard Schaumann For Atrial Fibrillation. Per note: Hold Coumadin.  Restart Coumadin tomorrow.  Take 5 mg on Monday, Wednesday, Friday, Saturday.  Take 10 mg on Tuesday, Thursday, Sunday.  Recheck INR in 4 weeks. 04/07/20 Dr. Dennard Schaumann For Type 2 DM. Per note:Decrease Coumadin to 5 mg on Monday and Friday and 10 mg on all other days and recheck INR in 4 weeks 03/22/20 Dr. Dennard Schaumann For Atrial Fibrillation. Per note: We can increase the clonidine to 0.2 twice a day if necessary. 03/10/20 Dr. Dennard Schaumann For HTN STARTED Clonidine 0.1 mg 2 times daily.  03/03/20 Dr. Dennard Schaumann For HTN STARTED Chlorthalidone 25 mg daily.   Recent consult visits:  06/23/20 Hematology and Oncology [name], MD. For malignant neoplasm of base of tongue. STOPPED/COMPLETED Lidocaine, Ondansetron, Prochlorperazine, Temazepam,Zolpidem. 05/26/20 Hematology and Oncology [name], MD. For malignant neoplasm of base of tongue. No medication changes. 05/09/20 Hematology and Oncology [name], MD.  For follow-up. No medication changes.  04/25/20 Hematology and Oncology [name], MD. For malignant neoplasm of base of tongue. No medication changes. 04/18/20 Hematology and Oncology [name], MD. For malignant neoplasm of base of tongue. STOPPED Dexamethasone. 04/07/20 Hematology and Oncology [name], MD. For malignant neoplasm of base of tongue. No medication changes. 04/04/20 Hematology and Oncology [name], MD. For malignant  neoplasm of base of tongue. No medication changes.  03/30/20 Hematology and Oncology [name], MD. For malignant neoplasm of base of tongue. No medication changes.  03/24/20 Hematology and Oncology [name], MD. For malignant neoplasm of base of tongue. No medication changes.  03/18/20 Hematology and Oncology [name], MD. For malignant neoplasm of base of tongue. No medication changes.  03/14/20 Hematology and Oncology [name], MD. For malignant neoplasm of base of tongue. No medication changes.  03/11/20 Otolaryngology Teoh,Su. No information given.  03/03/20 Hematology and Oncology [name], MD. For malignant neoplasm of base of tongue. No medication changes.  112/22 Dentistry Owsley, Bunk Foss, DMD. No medication changes.  02/17/20 Otolaryngology Teoh,Su. No information given.  02/01/20 Otolaryngology Teoh,Su. No information given.   Hospital visits: 05/02/20 [facility] (4 Hours) Milton Ferguson, MD. For dehydration. EKG preformed, labs drawn. No medication changes. 02/10/20 [facility] (4 Hours) [name], MD. For laryngoscopy with biopsy.  Medication History:  Atorvastatin 20 mg 90 DS 06/27/20 Pioglitazone 15 mg 90 DS 06/27/20 Losartan 50 mg 90 DS 06/27/20  Medications: Outpatient Encounter Medications as of 07/28/2020  Medication Sig Note   amLODipine (NORVASC) 10 MG tablet Take 1 tablet (10 mg total) by mouth daily.    atorvastatin (LIPITOR) 20 MG tablet TAKE 1 TABLET (20 MG TOTAL) DAILY BY MOUTH.    Blood Glucose Monitoring Suppl (ONETOUCH VERIO IQ SYSTEM) w/Device KIT 1 each by Does not apply route 2 (two) times daily.    carvedilol (COREG) 25 MG tablet TAKE 1 TABLET BY MOUTH TWICE A DAY WITH A MEAL (Patient taking differently: Take 25 mg by mouth 2 (two)  times daily with a meal.)    chlorthalidone (HYGROTON) 25 MG tablet TAKE 1 TABLET BY MOUTH EVERY DAY    cloNIDine (CATAPRES) 0.1 MG tablet Take 1 tablet (0.1  mg total) by mouth 2 (two) times daily.    Continuous Blood Gluc Receiver (FREESTYLE LIBRE 2 READER) DEVI Use as directed to monitor blood glucose continuously. DX E11.65    Continuous Blood Gluc Sensor (FREESTYLE LIBRE 2 SENSOR) MISC Use as directed to monitor blood glucose continuously. Change sensor Q 14 days. DX E11.65    glucose blood test strip 1 each by Other route as needed for other. Use as instructed    hydrALAZINE (APRESOLINE) 50 MG tablet TAKE 1 TABLET BY MOUTH THREE TIMES A DAY    Insulin Pen Needle (B-D UF III MINI PEN NEEDLES) 31G X 5 MM MISC USE DAILY WITH INSULIN    Insulin Syringe-Needle U-100 (INSULIN SYRINGE .3CC/31GX5/16") 31G X 5/16" 0.3 ML MISC Use twice daily to inject insulin.    LEVEMIR FLEXTOUCH 100 UNIT/ML FlexPen INJECT 70 UNITS UNDER THE SKIN EVERY MORNING AND 40 UNITS AT BEDTIME 03/23/2020: Updated medication info after initial assessment, Pt states he took am insulin today 03/23/20 however did not take last night's dose   LORazepam (ATIVAN) 0.5 MG tablet Take 1 tablet (0.5 mg total) by mouth every 6 (six) hours as needed (Nausea or vomiting). 03/09/2020: Will begin with chemo   losartan (COZAAR) 50 MG tablet Take 1 tablet (50 mg total) by mouth daily.    magnesium oxide (MAG-OX) 400 MG tablet Take 400 mg by mouth daily.    OneTouch Delica Lancets 97L MISC 1 each by Does not apply route 2 (two) times daily.    pioglitazone (ACTOS) 15 MG tablet Take 1 tablet (15 mg total) by mouth daily.    warfarin (COUMADIN) 10 MG tablet Take 1 tablet (10 mg total) by mouth daily.    No facility-administered encounter medications on file as of 07/28/2020.    Have you seen any other providers since your last visit? Patient stated no.  Any changes in your medications or health? Patient stated no.  Any side effects from any medications? Patient stated no.  Do you have an symptoms or problems not managed by your medications? Patient stated no.  Any concerns about your health right now?  Patient stated no.  Has your provider asked that you check blood pressure, blood sugar, or follow special diet at home? Patient stated no.  Do you get any type of exercise on a regular basis? Patient stated he walks some and has a part time job.  Can you think of a goal you would like to reach for your health? Patient stated he would like to get his catheter ther removed.   Do you have any problems getting your medications? Patient stated no.   Is there anything that you would like to discuss during the appointment? Patient stated no.  Please bring medications and supplements to appointment, patient reminded of his face to face appointment on 08/01/20 at 9 am.  [place], [name] Pharmacist Assistant [phone]

## 2020-08-01 ENCOUNTER — Other Ambulatory Visit: Payer: Self-pay

## 2020-08-01 ENCOUNTER — Ambulatory Visit (INDEPENDENT_AMBULATORY_CARE_PROVIDER_SITE_OTHER): Payer: Medicare Other | Admitting: Pharmacist

## 2020-08-01 DIAGNOSIS — I5020 Unspecified systolic (congestive) heart failure: Secondary | ICD-10-CM

## 2020-08-01 DIAGNOSIS — E782 Mixed hyperlipidemia: Secondary | ICD-10-CM

## 2020-08-01 DIAGNOSIS — I4891 Unspecified atrial fibrillation: Secondary | ICD-10-CM

## 2020-08-01 DIAGNOSIS — I10 Essential (primary) hypertension: Secondary | ICD-10-CM | POA: Diagnosis not present

## 2020-08-01 DIAGNOSIS — E1169 Type 2 diabetes mellitus with other specified complication: Secondary | ICD-10-CM | POA: Diagnosis not present

## 2020-08-01 DIAGNOSIS — E669 Obesity, unspecified: Secondary | ICD-10-CM

## 2020-08-01 NOTE — Progress Notes (Signed)
Chronic Care Management Pharmacy Note  08/01/2020 Name:  Brian [name] MRN:  [id number] DOB:  04-18-1955  Summary: Initial visit with PharmD.  States new DM regimen is just Lantus 40 units qam. No longer taking Actos or Levemir - unclear when change was made, states he was "crashing."  No more episodes since medication change.  Not checking glucose at home.  Has appointment with cancer doc Thursday for MRI as he has noticeable swelling in the neck region.  Recommendations/Changes made from today's visit: No med changes, would benefit from monitoring glucose - he is hesitant to start any new meds.  Focus on diet.  Plan: F/u 4 months   Subjective: Brian [name] is an 65 y.o. year old male who is a primary patient of Pickard, Cammie Mcgee, MD.  The CCM team was consulted for assistance with disease management and care coordination needs.    Engaged with patient face to face for initial visit in response to provider referral for pharmacy case management and/or care coordination services.   Consent to Services:  The patient was given the following information about Chronic Care Management services today, agreed to services, and gave verbal consent: 1. CCM service includes personalized support from designated clinical staff supervised by the primary care provider, including individualized plan of care and coordination with other care providers 2. 24/7 contact phone numbers for assistance for urgent and routine care needs. 3. Service will only be billed when office clinical staff spend 20 minutes or more in a month to coordinate care. 4. Only one practitioner may furnish and bill the service in a calendar month. 5.The patient may stop CCM services at any time (effective at the end of the month) by phone call to the office staff. 6. The patient will be responsible for cost sharing (co-pay) of up to 20% of the service fee (after annual deductible is met). Patient agreed to services and consent  obtained.  Patient Care Team: [name], MD as PCP - General (Family Medicine) [name], [name], RN as Oncology Nurse Navigator [name], MD as Consulting Physician (Radiation Oncology) [name], MD as Consulting Physician (Hematology and Oncology) [name], RD as Dietitian (Nutrition) [name], [name], [name] as Speech Language Pathologist (Speech Pathology) [name], [name], PT as Physical Therapist (Physical Therapy) Justice Britain as Social Worker (General Practice) [name], MD as Consulting Physician (Otolaryngology) [name], [facility] as Pharmacist (Pharmacist)  Recent office visits:  06/24/20 Dr. Dennard Schaumann For Type 2 DM. No medication changes. 05/13/20 Dr. Dennard Schaumann For Atrial Fibrillation. Per note: Hold Coumadin.  Restart Coumadin tomorrow.  Take 5 mg on Monday, Wednesday, Friday, Saturday.  Take 10 mg on Tuesday, Thursday, Sunday.  Recheck INR in 4 weeks. 04/07/20 Dr. Dennard Schaumann For Type 2 DM. Per note:Decrease Coumadin to 5 mg on Monday and Friday and 10 mg on all other days and recheck INR in 4 weeks 03/22/20 Dr. Dennard Schaumann For Atrial Fibrillation. Per note: We can increase the clonidine to 0.2 twice a day if necessary. 03/10/20 Dr. Dennard Schaumann For HTN STARTED Clonidine 0.1 mg 2 times daily. 03/03/20 Dr. Dennard Schaumann For HTN STARTED Chlorthalidone 25 mg daily.   Recent consult visits:  06/23/20 Hematology and Oncology [name], MD. For malignant neoplasm of base of tongue. STOPPED/COMPLETED Lidocaine, Ondansetron, Prochlorperazine, Temazepam,Zolpidem. 05/26/20 Hematology and Oncology [name], MD. For malignant neoplasm of base of tongue. No medication changes. 05/09/20 Hematology and Oncology [name], MD.  For follow-up. No medication  changes. 04/25/20 Hematology and Oncology [name], MD. For malignant neoplasm of base of tongue. No medication changes. 04/18/20 Hematology and Oncology [name], MD. For malignant neoplasm  of base of tongue. STOPPED Dexamethasone. 04/07/20 Hematology and Oncology [name], MD. For malignant neoplasm of base of tongue. No medication changes. 04/04/20 Hematology and Oncology [name], MD. For malignant neoplasm of base of tongue. No medication changes. 03/30/20 Hematology and Oncology [name], MD. For malignant neoplasm of base of tongue. No medication changes. 03/24/20 Hematology and Oncology [name], MD. For malignant neoplasm of base of tongue. No medication changes. 03/18/20 Hematology and Oncology [name], MD. For malignant neoplasm of base of tongue. No medication changes. 03/14/20 Hematology and Oncology [name], MD. For malignant neoplasm of base of tongue. No medication changes. 03/11/20 Otolaryngology Teoh,Su. No information given. 03/03/20 Hematology and Oncology [name], MD. For malignant neoplasm of base of tongue. No medication changes. 112/22 Dentistry Owsley, Morrill, DMD. No medication changes. 02/17/20 Otolaryngology Teoh,Su. No information given. 02/01/20 Otolaryngology Teoh,Su. No information given.    Hospital visits: 05/02/20 [facility] (4 Hours) Milton Ferguson, MD. For dehydration. EKG preformed, labs drawn. No medication changes. 02/10/20 [facility] [address] (4 Hours) [name], MD. For laryngoscopy with biopsy.   Medication History: Atorvastatin 20 mg 90 DS 06/27/20 Pioglitazone 15 mg 90 DS 06/27/20 Losartan 50 mg 90 DS 06/27/20   Objective:  Lab Results  Component Value Date   CREATININE 2.33 (H) 05/09/2020   BUN 59 (H) 05/09/2020   GFR 57.49 (L) 02/28/2010   GFRNONAA 30 (L) 05/09/2020   GFRAA 70 11/30/2019   NA 128 (L) 05/09/2020   K 4.3 05/09/2020   CALCIUM 8.7 (L) 05/09/2020   CO2 24 05/09/2020   GLUCOSE 335 (H) 05/09/2020    Lab Results  Component Value Date/Time   HGBA1C 7.9 (H) 06/24/2020 09:13 AM   HGBA1C 8.0 (H) 11/30/2019 09:15 AM   GFR 57.49 (L)  02/28/2010 12:27 PM   GFR 39.33 (L) 02/22/2010 10:56 AM   MICROALBUR 6.7 02/17/2019 09:15 AM   MICROALBUR CANCELED 09/06/2017 09:04 AM    Last diabetic Eye exam:  Lab Results  Component Value Date/Time   HMDIABEYEEXA No Retinopathy 06/21/2020 12:00 AM    Last diabetic Foot exam:  Lab Results  Component Value Date/Time   HMDIABFOOTEX yes 01/03/2010 12:00 AM     Lab Results  Component Value Date   CHOL 135 11/30/2019   HDL 29 (L) 11/30/2019   LDLCALC 79 11/30/2019   TRIG 170 (H) 11/30/2019   CHOLHDL 4.7 11/30/2019    Hepatic Function Latest Ref Rng & Units 05/09/2020 05/02/2020 04/25/2020  Total Protein 6.5 - 8.1 g/dL 6.2(L) 6.1(L) 6.2(L)  Albumin 3.5 - 5.0 g/dL 2.8(L) 3.0(L) 2.9(L)  AST 15 - 41 U/L 10(L) 18 13(L)  ALT 0 - 44 U/L _0 Alk Phosphatase 38 - 126 U/L 51 43 48  Total Bilirubin 0.3 - 1.2 mg/dL 0.4 0.8 0.5  Bilirubin, Direct 0.0 - 0.3 mg/dL - - -    Lab Results  Component Value Date/Time   TSH 1.293 03/15/2020 08:23 AM   TSH 0.97 03/08/2017 04:39 PM   TSH 1.133 12/09/2009 08:02 PM    CBC Latest Ref Rng & Units 05/09/2020 05/02/2020 04/25/2020  WBC 4.0 - 10.5 K/uL 2.1(L) 2.2(L) 3.2(L)  Hemoglobin 13.0 - 17.0 g/dL 8.4(L) 9.0(L) 9.0(L)  Hematocrit 39.0 - 52.0 % 24.9(L) 27.5(L) 27.0(L)  Platelets 150 - 400 K/uL  83(L) 92(L) 99(L)    No results found for: VD25OH  Clinical ASCVD: No  The ASCVD Risk score Mikey Bussing DC Jr., et al., 2013) failed to calculate for the following reasons:   The patient has a prior MI or stroke diagnosis    Depression screen [facility] 2/9 04/21/2019 04/26/2017 03/15/2017  Decreased Interest 0 0 0  Down, Depressed, Hopeless 0 0 0  PHQ - 2 Score 0 0 0  Altered sleeping - - -  Tired, decreased energy - - -  Change in appetite - - -  Feeling bad or failure about yourself  - - -  Trouble concentrating - - -  Moving slowly or fidgety/restless - - -  Suicidal thoughts - - -  PHQ-9 Score - - -  Difficult doing work/chores - - -  Some recent data  might be hidden      Social History   Tobacco Use  Smoking Status Never  Smokeless Tobacco Never   BP Readings from Last 3 Encounters:  06/24/20 (!) 152/92  06/23/20 (!) 156/100  05/26/20 (!) 153/85   Pulse Readings from Last 3 Encounters:  06/24/20 82  06/23/20 80  05/26/20 79   Wt Readings from Last 3 Encounters:  06/24/20 206 lb (93.4 kg)  06/23/20 206 lb 12.8 oz (93.8 kg)  05/26/20 203 lb 8 oz (92.3 kg)   BMI Readings from Last 3 Encounters:  06/24/20 30.42 kg/m  06/23/20 30.54 kg/m  05/26/20 30.05 kg/m    Assessment/Interventions: Review of patient past medical history, allergies, medications, health status, including review of consultants reports, laboratory and other test data, was performed as part of comprehensive evaluation and provision of chronic care management services.   SDOH:  (Social Determinants of Health) assessments and interventions performed: Yes  Financial Resource Strain: Low Risk    Difficulty of Paying Living Expenses: Not very hard    SDOH Screenings   Alcohol Screen: Not on file  Depression (PHQ2-9): Not on file  Financial Resource Strain: Low Risk    Difficulty of Paying Living Expenses: Not very hard  Food Insecurity: No Food Insecurity   Worried About Charity fundraiser in the Last Year: Never true   Ran Out of Food in the Last Year: Never true  Housing: Low Risk    Last Housing Risk Score: 0  Physical Activity: Not on file  Social Connections: Socially Isolated   Frequency of Communication with Friends and Family: More than three times a week   Frequency of Social Gatherings with Friends and Family: More than three times a week   Attends Religious Services: Never   Marine scientist or Organizations: No   Attends Archivist Meetings: Never   Marital Status: Divorced  Stress: Stress Concern Present   Feeling of Stress : To some extent  Tobacco Use: Low Risk    Smoking Tobacco Use: Never   Smokeless Tobacco  Use: Never  Transportation Needs: No Transportation Needs   Lack of Transportation (Medical): No   Lack of Transportation (Non-Medical): No    CCM Care Plan  Allergies  Allergen Reactions   Codeine Other (See Comments)    Unknown reaction, severe headach    Medications Reviewed Today     Reviewed by Six, Eden Lathe, LPN (Licensed Practical Nurse) on 06/24/20 at [place]  Med List Status: <None>   Medication Order Taking? Sig Documenting Provider Last Dose Status Informant  amLODipine (NORVASC) 10 MG tablet [id number] Yes Take 1 tablet (10 mg  total) by mouth daily. [name], MD Taking Active Self  atorvastatin (LIPITOR) 20 MG tablet [id number] Yes TAKE 1 TABLET (20 MG TOTAL) DAILY BY MOUTH. [name], MD Taking Active   Blood Glucose Monitoring Suppl [facility] VERIO IQ SYSTEM) w/Device KIT [id number] Yes 1 each by Does not apply route 2 (two) times daily. [name], MD Taking Active   carvedilol (COREG) 25 MG tablet [id number] Yes TAKE 1 TABLET BY MOUTH TWICE A DAY WITH A MEAL  Patient taking differently: Take 25 mg by mouth 2 (two) times daily with a meal.   [name], MD Taking Active   chlorthalidone (HYGROTON) 25 MG tablet [id number] Yes TAKE 1 TABLET BY MOUTH EVERY DAY [name], MD Taking Active   cloNIDine (CATAPRES) 0.1 MG tablet [id number] Yes Take 1 tablet (0.1 mg total) by mouth 2 (two) times daily. [name], MD Taking Active   Continuous Blood Gluc Receiver (FREESTYLE LIBRE 2 READER) [place]VI [id number] Yes Use as directed to monitor blood glucose continuously. DX E11.65 [name], MD Taking Active   Continuous Blood Gluc Sensor (FREESTYLE LIBRE 2 SENSOR) [place] [id number] Yes Use as directed to monitor blood glucose continuously. Change sensor Q 14 days. DX E11.65 [name], MD Taking Active   furosemide (LASIX) 20 MG tablet [id number] Yes TAKE 1 TABLET (20 MG TOTAL) BY MOUTH DAILY AS NEEDED FOR FLUID. [name], MD  Taking Active   glucose blood test strip [id number] Yes 1 each by Other route as needed for other. Use as instructed [name], MD Taking Active   hydrALAZINE (APRESOLINE) 50 MG tablet [id number] Yes TAKE 1 TABLET BY MOUTH THREE TIMES A DAY [name], MD Taking Active   Insulin Pen Needle (B-D UF III MINI PEN NEEDLES) 31G X 5 MM MISC [id number] Yes USE DAILY WITH INSULIN [name], MD Taking Active Self  Insulin Syringe-Needle U-100 (INSULIN SYRINGE .3CC/31GX5/16") 31G X 5/16" 0.3 ML MISC [id number] Yes Use twice daily to inject insulin. [name], MD Taking Active Self  LEVEMIR FLEXTOUCH 100 UNIT/ML FlexPen [id number] Yes INJECT 70 UNITS UNDER THE SKIN EVERY MORNING AND 40 UNITS AT BEDTIME [name], MD Taking Active            Med Note Landry Mellow, Okey Regal Mar 23, 2020 11:32 AM) Updated medication info after initial assessment, Pt states he took am insulin today 03/23/20 however did not take last night's dose  LORazepam (ATIVAN) 0.5 MG tablet [id number] Yes Take 1 tablet (0.5 mg total) by mouth every 6 (six) hours as needed (Nausea or vomiting). [name], MD Taking Active            Med Note Tawanna Solo Mar 09, 2020  8:07 AM) Will begin with chemo  losartan (COZAAR) 50 MG tablet [id number] Yes Take 1 tablet (50 mg total) by mouth daily. [name], MD Taking Active Self  magnesium oxide (MAG-OX) 400 MG tablet [id number] Yes Take 400 mg by mouth daily. [provider] Taking Active   OneTouch Delica Lancets 63W MISC [id number] Yes 1 each by Does not apply route 2 (two) times daily. [name], MD Taking Active   pioglitazone (ACTOS) 15 MG tablet [id number] Yes Take 1 tablet (15 mg total) by mouth daily. [name], MD Taking Active Self  warfarin (COUMADIN) 10 MG tablet [id number] Yes Take 1 tablet (10 mg total) by mouth daily.  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 ([place])    Status epilepticus ([place]) 01/18/2013   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 status epilepticus, not intractable ([place]) 01/15/2013   Hyperlipidemia    Long term current use of anticoagulant 03/11/2010   Diabetes mellitus, type II ([place]) 12/20/2009   Essential hypertension, benign 12/20/2009   Atrial fibrillation ([place]) 12/20/2009   CONGESTIVE HEART FAILURE, SYSTOLIC DYSFUNCTION 76/28/3151   CEREBROVASCULAR ACCIDENT 12/20/2009    Immunization History  Administered Date(s) Administered   Fluad Quad(high Dose 65+) 01/05/2019   Influenza Whole 12/11/2009   Influenza,inj,Quad PF,6+ Mos 11/22/2015, 11/06/2016, 10/29/2017   PFIZER(Purple Top)SARS-COV-2 Vaccination 03/02/2020, 03/24/2020   Pneumococcal Conjugate-13 01/29/2013   Pneumococcal Polysaccharide-23 12/11/2009    Conditions to be addressed/monitored:  HTN, Afib, Systolic HF, Type II DM w/ CKD, HLD   There are no care plans that you recently modified to display for this patient.    Medication Assistance: None required.  Patient affirms current coverage meets needs.  Compliance/Adherence/Medication fill history: Care Gaps: Needs foot exam  Star-Rating Drugs: Atorvastatin 20 mg 90 DS 06/27/20 Pioglitazone 15 mg 90 DS 06/27/20 Losartan 50 mg 90 DS 06/27/20  Patient's preferred pharmacy is:  [id number] [place] [place] [id number]: [phone]: [phone] Uses pill box? No - takes out of vials Pt endorses 100% compliance  We discussed: Benefits of medication synchronization, packaging and delivery as well as enhanced pharmacist oversight with [employer]. Patient decided to: Continue current  medication management strategy  Care Plan and Follow Up Patient Decision:  Patient agrees to Care Plan and Follow-up.  Plan: The care management team will reach out to the patient again over the next 120 days.  CBeverly Milch PharmD Clinical Pharmacist [place]([phone]

## 2020-08-01 NOTE — Patient Instructions (Addendum)
Visit Information   Goals Addressed             This Visit's Progress    Monitor and Manage My Blood Sugar-Diabetes Type 2       Timeframe:  Long-Range Goal Priority:  High Start Date:  08/01/20                           Expected End Date: 01/31/21                      Follow Up Date 11/18/20    - check blood sugar if I feel it is too high or too low - enter blood sugar readings and medication or insulin into daily log - take the blood sugar log to all doctor visits    Why is this important?   Checking your blood sugar at home helps to keep it from getting very high or very low.  Writing the results in a diary or log helps the doctor know how to care for you.  Your blood sugar log should have the time, date and the results.  Also, write down the amount of insulin or other medicine that you take.  Other information, like what you ate, exercise done and how you were feeling, will also be helpful.     Notes:         Patient Care Plan: General Pharmacy (Adult)     Problem Identified: HTN, Afib, Systolic HF, Type II DM w/ CKD, HLD   Priority: High  Onset Date: 08/01/2020     Long-Range Goal: Patient-Specific Goal   Start Date: 08/01/2020  Expected End Date: 01/31/2021  This Visit's Progress: On track  Priority: High  Note:   Current Barriers:  Unable to achieve control of glucose   Pharmacist Clinical Goal(s):  Patient will achieve adherence to monitoring guidelines and medication adherence to achieve therapeutic efficacy achieve control of glucose as evidenced by A1c adhere to prescribed medication regimen as evidenced by fill dates contact provider office for questions/concerns as evidenced notation of same in electronic health record through collaboration with PharmD and provider.   Interventions: 1:1 collaboration with [name], MD regarding development and update of comprehensive plan of care as evidenced by provider attestation and  co-signature Inter-disciplinary care team collaboration (see longitudinal plan of care) Comprehensive medication review performed; medication list updated in electronic medical record  Hypertension (BP goal <130/80) -Controlled -Current treatment: Amlodipine 10mg  daily Carvedilol 25mg  BID Chlorthalidone 25mg  daily Clonidine 0.1mg  twice daily Hydralazine 50mg  TID Losartan 50mg  daily -Medications previously tried: spironolactone  -Current home readings: 128/80 - no logs present he states this was the last time he checked -Current dietary habits: "watches what he eats", mainly drinks water and Gatorade Zero -Current exercise habits: minimal, does walk occasionall7 -Denies hypotensive/hypertensive symptoms -Educated on BP goals and benefits of medications for prevention of heart attack, stroke and kidney damage; Exercise goal of 150 minutes per week; Importance of home blood pressure monitoring; Symptoms of hypotension and importance of maintaining adequate hydration; -Counseled to monitor BP at home a few times per week, document, and provide log at future appointments -Recommended to continue current medication  Hyperlipidemia: (LDL goal < 70) -Controlled -Current treatment: Atorvastatin 20mg  daily -Medications previously tried: none noted  -Current dietary patterns: see above -Current exercise habits: see above -Educated on Cholesterol goals;  Benefits of statin for ASCVD risk reduction; Importance of limiting foods high in  cholesterol; -Goal < 70 for LDL, no changes to medication at this time, patient has fear his cancer has returned -Recommended to continue current medication  Diabetes (A1c goal <7%) -Not ideally controlled -Current medications: Levemir 100u/ml 40 units am Pioglitazone 15mg  daily - no longer taking -Medications previously tried: pioglitazone  -Current home glucose readings fasting glucose: not checking post prandial glucose: not checking -Denies  hypoglycemic/hyperglycemic symptoms -Current meal patterns: see above, does eat mixed hash browns once or twice weekly, beverages are mainly water and zero sugar gatorade -Current exercise: minimal -Educated on A1c and blood sugar goals; Benefits of routine self-monitoring of blood sugar; Continuous glucose monitoring; -Counseled to check feet daily and get yearly eye exams -Reports he is no longer taking Actos and has decreased his insulin dose to 40 units qam. -Currently not checking blood sugar, unsure of level of control.  He wants to try a CGM, however I fear insurance will not cover it with only one injection of insulin daily. -Counseled on diet and exercise extensively Recommended to continue current medication Counseled on making sure he eats regular meals to avoid hypoglycemia, checking glucose if symptomatic.  Recommend recheck A1c in August and then determining next steps from there considering his prognosis with cancer.  Atrial Fibrillation (Goal: prevent stroke and major bleeding) -Controlled -Current treatment: Rate control: Carvedilol 25mg  BID Anticoagulation: Warfarin 10mg  -Medications previously tried: none noted -Home BP and HR readings: 128/80  -Counseled on increased risk of stroke due to Afib and benefits of anticoagulation for stroke prevention; importance of adherence to anticoagulant exactly as prescribed; bleeding risk associated with warfarin and importance of self-monitoring for signs/symptoms of bleeding; avoidance of NSAIDs due to increased bleeding risk with anticoagulants; -Recommended to continue current medication  Heart Failure (Goal: manage symptoms and prevent exacerbations) -Controlled  -HF type: Systolic  -Current treatment: Carvedilol 25mg  BID Chlorthalidone 25mg  daily -Medications previously tried: none noted  -Current home BP/HR readings: 128/80 -Current dietary habits: see above -Current exercise habits: minimal, occasional  walking -Educated on Benefits of medications for managing symptoms and prolonging life Importance of weighing daily; if you gain more than 3 pounds in one day or 5 pounds in one week, contact providers Importance of blood pressure control -Recommended to continue current medication  Patient Goals/Self-Care Activities Patient will:  - take medications as prescribed focus on medication adherence by pill count check glucose if symptomatic, document, and provide at future appointments check blood pressure a few times per week, document, and provide at future appointments  Follow Up Plan: The care management team will reach out to the patient again over the next 120 days.      Mr. Bezek was given information about Chronic Care Management services today including:  CCM service includes personalized support from designated clinical staff supervised by his physician, including individualized plan of care and coordination with other care providers 24/7 contact phone numbers for assistance for urgent and routine care needs. Standard insurance, coinsurance, copays and deductibles apply for chronic care management only during months in which we provide at least 20 minutes of these services. Most insurances cover these services at 100%, however patients may be responsible for any copay, coinsurance and/or deductible if applicable. This service may help you avoid the need for more expensive face-to-face services. Only one practitioner may furnish and bill the service in a calendar month. The patient may stop CCM services at any time (effective at the end of the month) by phone call to the office staff.  Patient  agreed to services and verbal consent obtained.   The patient verbalized understanding of instructions, educational materials, and care plan provided today and agreed to receive a mailed copy of patient instructions, educational materials, and care plan.  Telephone follow up appointment with  pharmacy team member scheduled for:4 months  [name], [facility]

## 2020-08-08 ENCOUNTER — Other Ambulatory Visit: Payer: Self-pay

## 2020-08-08 ENCOUNTER — Ambulatory Visit (INDEPENDENT_AMBULATORY_CARE_PROVIDER_SITE_OTHER): Payer: Medicare Other | Admitting: Family Medicine

## 2020-08-08 ENCOUNTER — Encounter: Payer: Self-pay | Admitting: Family Medicine

## 2020-08-08 DIAGNOSIS — I4891 Unspecified atrial fibrillation: Secondary | ICD-10-CM

## 2020-08-08 LAB — PT WITH INR/FINGERSTICK
INR, fingerstick: 2.2 ratio — ABNORMAL HIGH
PT, fingerstick: 25.8 s — ABNORMAL HIGH (ref 10.5–13.1)

## 2020-08-08 NOTE — Progress Notes (Signed)
Subjective:    Patient ID: Brian [name], male    DOB: [birth year], 65 y.o.   MRN: [id number]   06/24/20 Taking 10 mg everyday for the last 3 weeks.  He decided to go back to his previous dose.  Despite changing his Coumadin dose, his INR today is excellent at 2.8.  He seems to be in much better spirits this time.  He got a good report from his cancer doctor yesterday.  He has completed his chemo and there is no palpable lymphadenopathy in his neck.  He is scheduled for a PET scan in July with plans of possibly removing his port if there is no activity seen on the PET scan.  This obviously has his spirits Brian [name].  His blood pressure is high this afternoon and seems to have gone up since he has gained some weight and is eating better.  However he states at home that his systolic blood pressure is in the 1 30-1 40 range with sounds well-controlled.  He is due to recheck his A1c today  08/08/20 HgA1c was 7.9.  I recommended adding farxiga for the cardiac benefits but he declined. Here to recheck coumadin.  He is currently taking Coumadin 10 mg a day.  INR is therapeutic today.  Unfortunately his blood pressure is high at 188/84.  However the patient is extremely nervous.  Since I last saw him, there has been drastic enlargement and a mass underneath his chin.  It is affecting his voice.  The pitch and strength of his voice is diminished.  Obviously there is pressure on his vocal cords from this mass.  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  He is completed chemo and radiation and has a PET scan scheduled for this Thursday and a follow-up appointment with his oncologist this Thursday to discuss the results.  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 Zigmund Daniel, laser therapy OD   Seizures ([place])    pt reports this is from low blood sugar    Past Surgical History:  Procedure Laterality Date   DIRECT LARYNGOSCOPY N/A 02/10/2020   Procedure: DIRECT LARYNGOSCOPY WITH BIOPSY;  Surgeon: [name], MD;  Location: [facility];  Service: ENT;  Laterality: N/A;   Ear surgery as a child     IR GASTROSTOMY TUBE MOD SED  03/23/2020   IR GASTROSTOMY TUBE REMOVAL  06/01/2020   IR IMAGING GUIDED PORT INSERTION  03/09/2020   TONSILLECTOMY     Current Outpatient Medications on File Prior to Visit  Medication Sig Dispense Refill   amLODipine (NORVASC) 10 MG tablet Take 1 tablet (10 mg total) by mouth daily. 90 tablet 1   atorvastatin (LIPITOR) 20 MG tablet TAKE 1 TABLET (20 MG TOTAL) DAILY BY MOUTH. 90 tablet 3   Blood Glucose Monitoring Suppl (ONETOUCH VERIO IQ SYSTEM) w/Device KIT 1 each by Does not apply route 2 (two) times daily. 1 kit 0   carvedilol (COREG) 25 MG tablet TAKE 1 TABLET BY MOUTH TWICE A DAY WITH A MEAL (Patient taking differently: Take 25 mg by mouth 2 (two) times  daily with a meal.) 180 tablet 3   chlorthalidone (HYGROTON) 25 MG tablet TAKE 1 TABLET BY MOUTH EVERY DAY 90 tablet 1   cloNIDine (CATAPRES) 0.1 MG tablet Take 1 tablet (0.1 mg total) by mouth 2 (two) times daily. 60 tablet 3   Continuous Blood Gluc Receiver (FREESTYLE LIBRE 2 READER) DEVI Use as directed to monitor blood glucose continuously. DX E11.65 1 each 1   Continuous Blood Gluc Sensor (FREESTYLE LIBRE 2 SENSOR) MISC Use as directed to monitor blood glucose continuously. Change sensor Q 14 days. DX E11.65 2 each 11   glucose blood test strip 1 each by Other route as needed for other. Use as instructed 100 each 11   hydrALAZINE (APRESOLINE) 50 MG tablet TAKE 1 TABLET BY MOUTH THREE TIMES A DAY 270 tablet 1   Insulin Pen Needle (B-D UF III MINI PEN NEEDLES) 31G X 5 MM MISC USE DAILY WITH INSULIN 100 each 5    Insulin Syringe-Needle U-100 (INSULIN SYRINGE .3CC/31GX5/16") 31G X 5/16" 0.3 ML MISC Use twice daily to inject insulin. 100 each 10   LEVEMIR FLEXTOUCH 100 UNIT/ML FlexPen INJECT 70 UNITS UNDER THE SKIN EVERY MORNING AND 40 UNITS AT BEDTIME 15 mL 3   LORazepam (ATIVAN) 0.5 MG tablet Take 1 tablet (0.5 mg total) by mouth every 6 (six) hours as needed (Nausea or vomiting). 30 tablet 0   losartan (COZAAR) 50 MG tablet Take 1 tablet (50 mg total) by mouth daily. 90 tablet 3   magnesium oxide (MAG-OX) 400 MG tablet Take 400 mg by mouth daily.     OneTouch Delica Lancets 21J MISC 1 each by Does not apply route 2 (two) times daily. 200 each 11   pioglitazone (ACTOS) 15 MG tablet Take 1 tablet (15 mg total) by mouth daily. (Patient not taking: Reported on 08/01/2020) 90 tablet 2   warfarin (COUMADIN) 10 MG tablet Take 1 tablet (10 mg total) by mouth daily. 90 tablet 3   No current facility-administered medications on file prior to visit.   Allergies  Allergen Reactions   Codeine Other (See Comments)    Unknown reaction, severe headach   Social History   Socioeconomic History   Marital status: Divorced    Spouse name: Not on file   Number of children: 2   Years of education: Not on file   Highest education level: Not on file  Occupational History   Occupation: Disabled  Tobacco Use   Smoking status: Never   Smokeless tobacco: Never  Vaping Use   Vaping Use: Never used  Substance and Sexual Activity   Alcohol use: Yes    Alcohol/week: 0.0 standard drinks    Comment: seldom   Drug use: Yes    Frequency: 7.0 times per week    Comment: marijauna for arthritis, last 03-13-20   Sexual activity: Not Currently  Other Topics Concern   Not on file  Social History Narrative   He has children.   Social Determinants of Health   Financial Resource Strain: Low Risk    Difficulty of Paying Living Expenses: Not very hard  Food Insecurity: No Food Insecurity   Worried About Charity fundraiser  in the Last Year: Never true   Ran Out of Food in the Last Year: Never true  Transportation Needs: No Transportation Needs   Lack of Transportation (Medical): No   Lack of Transportation (Non-Medical): No  Physical Activity: Not on file  Stress: Stress Concern Present   Feeling of Stress :  To some extent  Social Connections: Socially Isolated   Frequency of Communication with Friends and Family: More than three times a week   Frequency of Social Gatherings with Friends and Family: More than three times a week   Attends Religious Services: Never   Marine scientist or Organizations: No   Attends Archivist Meetings: Never   Marital Status: Divorced  Human resources officer Violence: Not on file    Review of Systems  All other systems reviewed and are negative.     Objective:   Physical Exam Vitals reviewed.  Cardiovascular:     Rate and Rhythm: Normal rate and regular rhythm.     Heart sounds: Normal heart sounds.  Pulmonary:     Effort: Pulmonary effort is normal. No respiratory distress.     Breath sounds: Normal breath sounds. No wheezing or rales.  Chest:     Chest wall: No tenderness.  Abdominal:     General: Bowel sounds are normal. There is no distension.     Palpations: Abdomen is soft.     Tenderness: There is no abdominal tenderness. There is no rebound.  Musculoskeletal:     Right lower leg: No edema.     Left lower leg: No edema.          Assessment & Plan:   Atrial fibrillation, unspecified type ([place]) - Plan: PT with INR/Fingerstick Unfortunately, there appears to be recurrence of his cancer.  There is a large mass in the anterior portion of his neck just below his chin which is affecting his vocal cords.  He has an appointment in 2 days to see his oncologist along with having a PET scan.  Patient states he does not want to have radiation any longer.  He states that he wants a surgery just to cut it out.  I tried to explain to him that surgery is  not going to be beneficial if there is spread to other areas.  At that point chemoradiation would be his best options.  Therefore he will await the results of the PET scan and the discussion with his oncologist on Thursday.  I certainly believe that this is part of the reason his blood pressure is as high as it is today.  Of asked him to increase losartan from 50 mg to 100 mg a day and then recheck via telephone on Thursday to see how he is doing.

## 2020-08-10 ENCOUNTER — Other Ambulatory Visit: Payer: Self-pay | Admitting: Hematology and Oncology

## 2020-08-11 ENCOUNTER — Ambulatory Visit (HOSPITAL_COMMUNITY): Payer: Medicare Other

## 2020-08-11 ENCOUNTER — Other Ambulatory Visit: Payer: Self-pay

## 2020-08-17 NOTE — Progress Notes (Signed)
Oncology Nurse Navigator Documentation   I spoke with Brian [name] today. I've reminded him of his upcoming appointments next week with Dr. Chryl Heck on 7/5 at 9:40 am and his PET scan at 12:00 on 7/7 with an arrival time of 11:30. He verbalized understanding of these appointments. He knows to call me if he needs me for anything.  Harlow Asa RN, BSN, OCN Head & Neck Oncology Nurse [place] at [facility] Phone # [phone]  Fax # [phone]

## 2020-08-18 ENCOUNTER — Telehealth: Payer: Self-pay | Admitting: Pharmacist

## 2020-08-18 NOTE — Progress Notes (Addendum)
    Chronic Care Management Pharmacy Assistant   Name: Brian [name]  MRN: [id number] DOB: Jun 29, 1955  Reason for Encounter: Adherence Review  Reviewed the patients chart for any medical/health and/or medication changes there were some changes to his medications.  Office Visit: 08/08/20 Dr. Dennard Schaumann For Atrial Fibrillation. Per note: asked him to increase losartan from 50 mg to 100 mg a day and then recheck via telephone on Thursday to see how he is doing.  Follow-Up:Pharmacist Review  Charlann Lange, [place] Pharmacist Assistant [phone]

## 2020-08-23 ENCOUNTER — Ambulatory Visit: Payer: Medicare Other | Admitting: Radiation Oncology

## 2020-08-23 ENCOUNTER — Other Ambulatory Visit: Payer: Self-pay

## 2020-08-23 ENCOUNTER — Inpatient Hospital Stay: Payer: Medicare Other | Attending: Hematology and Oncology | Admitting: Hematology and Oncology

## 2020-08-23 ENCOUNTER — Encounter: Payer: Self-pay | Admitting: Hematology and Oncology

## 2020-08-23 DIAGNOSIS — R490 Dysphonia: Secondary | ICD-10-CM | POA: Insufficient documentation

## 2020-08-23 DIAGNOSIS — C01 Malignant neoplasm of base of tongue: Secondary | ICD-10-CM

## 2020-08-23 DIAGNOSIS — R221 Localized swelling, mass and lump, neck: Secondary | ICD-10-CM | POA: Insufficient documentation

## 2020-08-23 DIAGNOSIS — Z85810 Personal history of malignant neoplasm of tongue: Secondary | ICD-10-CM | POA: Insufficient documentation

## 2020-08-23 NOTE — Assessment & Plan Note (Signed)
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 He initially received one weekly cycle of cisplatin, but given GFR issues, transitioned to weekly carboplatin. He completed 4 weekly cycles of carboplatin. Last cycle was omitted because of cytopenias and severe fatigue. He completed chemo in March 2022 About 3 weeks ago, he noticed swelling of the left neck and some new onset hoarseness of voice. PE, no palpable mass, but swelling of the neck which is asymmetrical noted He has PET scheduled in 2 days Will review images, if recurrence, will consider chemoimmunotherapy If lymphedema causing swelling, he will benefit from ST and massages.

## 2020-08-23 NOTE — Progress Notes (Signed)
[place] NOTE  Patient Care Team: [name], MD as PCP - General (Family Medicine) [name], [name], RN as Oncology Nurse Navigator [name], MD as Consulting Physician (Radiation Oncology) [name], MD as Consulting Physician (Hematology and Oncology) [name], RD as Dietitian (Nutrition) [name], [name], [name] as Speech Language Pathologist (Speech Pathology) [name], [name], PT as Physical Therapist (Physical Therapy) [name], [name] as Social Worker (General Practice) [name], MD as Consulting Physician (Otolaryngology) [name], [facility] as Pharmacist (Pharmacist)  CHIEF COMPLAINTS/PURPOSE OF CONSULTATION:   SCC of oropharynx.  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 He initially received one weekly cycle of cisplatin, but given GFR issues, transitioned to weekly carboplatin. He completed 4 weekly cycles of carboplatin. Last cycle was omitted because of cytopenias and severe fatigue. He completed chemo in March 2022 About 3 weeks ago, he noticed swelling of the left neck and some new onset hoarseness of voice. PE, no palpable mass, but swelling of the neck which is asymmetrical noted He has PET scheduled in 2 days Will review images, if recurrence, will consider chemoimmunotherapy If lymphedema causing swelling, he will benefit from ST and massages.     No orders of the defined types were placed in this encounter.   [name] MD   HISTORY OF PRESENTING ILLNESS:   Oncology History Overview Note  Brian [name] is a 65 y.o. male who presented two-week history of facial  swelling in late November/early December 2021. Subsequently, the patient saw Dr. Benjamine Mola, who performed a direct laryngoscopy with biopsy on the date of 02/10/2020. Final pathology revealed: squamous cell carcinoma, P16 positive   He had the following imaging performed  1. Soft tissue neck CT scan performed on 01/28/2020 revealing right glossotonsillar malignancy with bulky bilateral nodal disease. Nodal masses sowed definite extracapsular spread. There was also noted to be incidental advanced cervical spine degeneration with cord compression.  2. PET scan performed on 02/26/2020 revealing a hypermetabolic lesion at the right base of the tongue and right tonsil, consistent with oropharyngeal carcinoma. Carcinoma appeared to be confined to the mucosal surface. There were also noted to bulky bilateral hypermetabolic level II metastatic lymph nodes and smaller inferior hypermetabolic level II metastatic lymph nodes. There was no evidence of metastatic disease in the chest, abdomen, or pelvis. The two small right middle lobe pulmonary nodules present on CT from 2012 were consistent with benign etiology.  Started radiation on 03/14/2020 Chemo delayed by a week due to GFR and infusion issues. Started chemotherapy on 03/21/2020 C2 delayed because of AKI Now switched to weekly carboplatin given ongoing AKI C1 of weekly carboplatin on 04/04/2020 C2 of weekly carboplatin on 04/11/2020 C3 of weekly carboplatin on 04/18/2020 C4 Weekly carboplatin on 04/25/2020 Last planned cycle omitted.   Malignant neoplasm of base of tongue ([name])  03/01/2020 Cancer Staging   Staging form: Pharynx - HPV-Mediated Oropharynx, AJCC 8th Edition - Clinical stage from 03/01/2020: Stage III (cT2, cN3, cM0, p16+) - Signed by [name], MD on 03/02/2020    03/02/2020 Initial Diagnosis   Malignant neoplasm of base of tongue ([place])    03/21/2020 - 03/28/2020 Chemotherapy  04/04/2020 -  Chemotherapy   Completed chemotherapy on  04/25/2020  Patient is on Treatment Plan: HEAD/NECK CARBOPLATIN Q7D        Interim History  He complains of a mass in the left neck noted about 3 weeks ago along with some hoarseness Skin over the neck is very itchy No pain. No residual side effects from chemo No change in breathing, bowel or urinary habits Rest of the pertinent 10 point ROS reviewed and neg.  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 Zigmund Daniel, laser therapy OD   Seizures ([place])    pt reports this is from low blood sugar    SURGICAL HISTORY: Past Surgical History:  Procedure Laterality Date   DIRECT LARYNGOSCOPY N/A 02/10/2020   Procedure: DIRECT LARYNGOSCOPY WITH BIOPSY;  Surgeon: [name], MD;  Location: [facility];  Service: ENT;  Laterality: N/A;   Ear surgery as a child     IR GASTROSTOMY TUBE MOD SED  03/23/2020   IR GASTROSTOMY TUBE REMOVAL  06/01/2020   IR IMAGING GUIDED PORT INSERTION  03/09/2020   TONSILLECTOMY      SOCIAL HISTORY: Social History   Socioeconomic History   Marital status: Divorced    Spouse name: Not on file   Number of children: 2   Years of education: Not on file   Highest education level: Not on file  Occupational History   Occupation: Disabled  Tobacco Use   Smoking status: Never   Smokeless tobacco: Never  Vaping Use   Vaping Use: Never used  Substance and Sexual Activity   Alcohol use: Yes    Alcohol/week: 0.0 standard drinks    Comment: seldom   Drug use: Yes    Frequency: 7.0 times per week    Comment: marijauna for arthritis, last 03-13-20   Sexual activity: Not Currently  Other Topics Concern   Not on file  Social History Narrative   He has children.   Social  Determinants of Health   Financial Resource Strain: Low Risk    Difficulty of Paying Living Expenses: Not very hard  Food Insecurity: No Food Insecurity   Worried About Charity fundraiser in the Last Year: Never true   Ran Out of Food in the Last Year: Never true  Transportation Needs: No Transportation Needs   Lack of Transportation (Medical): No   Lack of Transportation (Non-Medical): No  Physical Activity: Not on file  Stress: Stress Concern Present   Feeling of Stress : To some extent  Social Connections: Socially Isolated   Frequency of Communication with Friends and Family: More than three times a week   Frequency of Social Gatherings with Friends and Family: More than three times a week   Attends Religious Services: Never   Marine scientist or Organizations: No   Attends Music therapist: Never   Marital Status: Divorced  Human resources officer Violence: Not on file    FAMILY HISTORY: Family History  Problem Relation Age of Onset   Hyperlipidemia Father    Hypertension Father    Coronary artery disease Neg Hx  no early CAD.   Colon cancer Neg Hx    Stomach cancer Neg Hx    Rectal cancer Neg Hx    Esophageal cancer Neg Hx     ALLERGIES:  is allergic to codeine.  MEDICATIONS:  Current Outpatient Medications  Medication Sig Dispense Refill   amLODipine (NORVASC) 10 MG tablet Take 1 tablet (10 mg total) by mouth daily. 90 tablet 1   atorvastatin (LIPITOR) 20 MG tablet TAKE 1 TABLET (20 MG TOTAL) DAILY BY MOUTH. 90 tablet 3   Blood Glucose Monitoring Suppl (ONETOUCH VERIO IQ SYSTEM) w/Device KIT 1 each by Does not apply route 2 (two) times daily. 1 kit 0   carvedilol (COREG) 25 MG tablet TAKE 1 TABLET BY MOUTH TWICE A DAY WITH A MEAL (Patient taking differently: Take 25 mg by mouth 2 (two) times daily with a meal.) 180 tablet 3   chlorthalidone (HYGROTON) 25 MG tablet TAKE 1 TABLET BY MOUTH EVERY DAY 90 tablet 1   cloNIDine (CATAPRES) 0.1 MG tablet  Take 1 tablet (0.1 mg total) by mouth 2 (two) times daily. 60 tablet 3   Continuous Blood Gluc Receiver (FREESTYLE LIBRE 2 READER) DEVI Use as directed to monitor blood glucose continuously. DX E11.65 1 each 1   Continuous Blood Gluc Sensor (FREESTYLE LIBRE 2 SENSOR) MISC Use as directed to monitor blood glucose continuously. Change sensor Q 14 days. DX E11.65 2 each 11   glucose blood test strip 1 each by Other route as needed for other. Use as instructed 100 each 11   hydrALAZINE (APRESOLINE) 50 MG tablet TAKE 1 TABLET BY MOUTH THREE TIMES A DAY 270 tablet 1   Insulin Pen Needle (B-D UF III MINI PEN NEEDLES) 31G X 5 MM MISC USE DAILY WITH INSULIN 100 each 5   Insulin Syringe-Needle U-100 (INSULIN SYRINGE .3CC/31GX5/16") 31G X 5/16" 0.3 ML MISC Use twice daily to inject insulin. 100 each 10   LEVEMIR FLEXTOUCH 100 UNIT/ML FlexPen INJECT 70 UNITS UNDER THE SKIN EVERY MORNING AND 40 UNITS AT BEDTIME 15 mL 3   LORazepam (ATIVAN) 0.5 MG tablet Take 1 tablet (0.5 mg total) by mouth every 6 (six) hours as needed (Nausea or vomiting). 30 tablet 0   losartan (COZAAR) 50 MG tablet Take 1 tablet (50 mg total) by mouth daily. (Patient taking differently: Take 100 mg by mouth daily.) 90 tablet 3   magnesium oxide (MAG-OX) 400 MG tablet Take 400 mg by mouth daily.     OneTouch Delica Lancets 14E MISC 1 each by Does not apply route 2 (two) times daily. 200 each 11   pioglitazone (ACTOS) 15 MG tablet Take 1 tablet (15 mg total) by mouth daily. 90 tablet 2   warfarin (COUMADIN) 10 MG tablet Take 1 tablet (10 mg total) by mouth daily. 90 tablet 3   No current facility-administered medications for this visit.     PHYSICAL EXAMINATION: ECOG PERFORMANCE STATUS: 0 - Asymptomatic  Vitals:   08/23/20 1019  BP: 126/76  Pulse: 71  Resp: 17  Temp: 97.6 F (36.4 C)  SpO2: 100%    Filed Weights   08/23/20 1019  Weight: 206 lb (93.4 kg)      Physical Exam Constitutional:      Appearance: Normal  appearance.  HENT:     Head: Normocephalic and atraumatic.     Nose: Nose normal.     Mouth/Throat:     Mouth: Mucous membranes are moist.  Cardiovascular:     Rate and Rhythm:  Normal rate and regular rhythm.     Pulses: Normal pulses.     Heart sounds: Normal heart sounds.  Pulmonary:     Effort: Pulmonary effort is normal.     Breath sounds: Normal breath sounds.  Abdominal:     General: Abdomen is flat.     Palpations: Abdomen is soft.  Musculoskeletal:        General: No swelling. Normal range of motion.     Cervical back: Normal range of motion and neck supple.  Lymphadenopathy:     Cervical: Cervical adenopathy (neck swelling noted, asymmetrical, left neck swollen greater than right. no palpable LN, some swelling with out a definite mass appreciated) present.  Skin:    General: Skin is warm and dry.  Neurological:     General: No focal deficit present.     Mental Status: He is alert and oriented to person, place, and time.  Psychiatric:        Mood and Affect: Mood normal.        Behavior: Behavior normal.     LABORATORY DATA:  I have reviewed the data as listed Lab Results  Component Value Date   WBC 2.1 (L) 05/09/2020   HGB 8.4 (L) 05/09/2020   HCT 24.9 (L) 05/09/2020   MCV 85.0 05/09/2020   PLT 83 (L) 05/09/2020     Chemistry      Component Value Date/Time   NA 128 (L) 05/09/2020 0914   K 4.3 05/09/2020 0914   CL 96 (L) 05/09/2020 0914   CO2 24 05/09/2020 0914   BUN 59 (H) 05/09/2020 0914   CREATININE 2.33 (H) 05/09/2020 0914   CREATININE 1.23 11/30/2019 0915      Component Value Date/Time   CALCIUM 8.7 (L) 05/09/2020 0914   ALKPHOS 51 05/09/2020 0914   AST 10 (L) 05/09/2020 0914   ALT 14 05/09/2020 0914   BILITOT 0.4 05/09/2020 0914       RADIOGRAPHIC STUDIES: I have personally reviewed the radiological images as listed and agreed with the findings in the report. No results found.   All questions were answered. The patient knows to call  the clinic with any problems, questions or concerns.[name], MD 08/23/2020 10:52 AM

## 2020-08-25 ENCOUNTER — Other Ambulatory Visit: Payer: Self-pay

## 2020-08-25 ENCOUNTER — Encounter (HOSPITAL_COMMUNITY)
Admission: RE | Admit: 2020-08-25 | Discharge: 2020-08-25 | Disposition: A | Discharge status: Home / Self Care | Payer: Medicare Other | Source: Ambulatory Visit | Attending: Radiation Oncology | Admitting: Radiation Oncology

## 2020-08-25 DIAGNOSIS — I2510 Atherosclerotic heart disease of native coronary artery without angina pectoris: Secondary | ICD-10-CM | POA: Insufficient documentation

## 2020-08-25 DIAGNOSIS — M47812 Spondylosis without myelopathy or radiculopathy, cervical region: Secondary | ICD-10-CM | POA: Insufficient documentation

## 2020-08-25 DIAGNOSIS — C01 Malignant neoplasm of base of tongue: Secondary | ICD-10-CM | POA: Insufficient documentation

## 2020-08-25 DIAGNOSIS — R918 Other nonspecific abnormal finding of lung field: Secondary | ICD-10-CM | POA: Insufficient documentation

## 2020-08-25 DIAGNOSIS — I517 Cardiomegaly: Secondary | ICD-10-CM | POA: Diagnosis not present

## 2020-08-25 LAB — GLUCOSE, CAPILLARY: Glucose-Capillary: 211 mg/dL — ABNORMAL HIGH (ref 70–99)

## 2020-08-25 MED ORDER — FLUDEOXYGLUCOSE F - 18 (FDG) INJECTION
10.3000 | Freq: Once | INTRAVENOUS | Status: AC
  Administered 2020-08-25: 10.25 via INTRAVENOUS

## 2020-09-01 ENCOUNTER — Ambulatory Visit: Payer: Medicare Other | Attending: Radiation Oncology | Admitting: Physical Therapy

## 2020-09-01 ENCOUNTER — Other Ambulatory Visit: Payer: Self-pay

## 2020-09-01 DIAGNOSIS — R293 Abnormal posture: Secondary | ICD-10-CM | POA: Diagnosis not present

## 2020-09-01 DIAGNOSIS — I890 Lymphedema, not elsewhere classified: Secondary | ICD-10-CM | POA: Diagnosis not present

## 2020-09-01 DIAGNOSIS — L599 Disorder of the skin and subcutaneous tissue related to radiation, unspecified: Secondary | ICD-10-CM | POA: Diagnosis not present

## 2020-09-01 DIAGNOSIS — C01 Malignant neoplasm of base of tongue: Secondary | ICD-10-CM | POA: Insufficient documentation

## 2020-09-01 DIAGNOSIS — R29810 Facial weakness: Secondary | ICD-10-CM

## 2020-09-01 NOTE — Therapy (Signed)
[place], [name], [address] Phone: [phone]   Fax:  [phone]  Physical Therapy Evaluation  Patient Details  Name: Brian [name] MRN: [id number] Date of Birth: [birth year] Referring Provider (PT): Dr. Chryl Heck   Encounter Date: 09/01/2020   PT End of Session - 09/01/20 1823     Visit Number 1    Number of Visits 9    Date for PT Re-Evaluation 10/03/20    PT Start Time 1400    PT Stop Time 9163    PT Time Calculation (min) 45 min    Activity Tolerance Patient tolerated treatment well    Behavior During Therapy [facility] for tasks assessed/performed             Past Medical History:  Diagnosis Date   Arthritis    Atrial fibrillation ([place])    Cancer ([place])    SCC base of tongue 2021   Cardiomyopathy    Cerebrovascular accident [facility])    2012   Diabetes mellitus    Hyperlipidemia    Hypertension    Left-sided weakness    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 Zigmund Daniel, laser therapy OD   Seizures ([place])    pt reports this is from low blood sugar    Past Surgical History:  Procedure Laterality Date   DIRECT LARYNGOSCOPY N/A 02/10/2020   Procedure: DIRECT LARYNGOSCOPY WITH BIOPSY;  Surgeon: [name], MD;  Location: [place];  Service: ENT;  Laterality: N/A;   Ear surgery as a child     IR GASTROSTOMY TUBE MOD SED  03/23/2020   IR GASTROSTOMY TUBE REMOVAL  06/01/2020   IR IMAGING GUIDED PORT INSERTION  03/09/2020   TONSILLECTOMY      There were no vitals filed for this visit.    Subjective Assessment - 09/01/20 1403     Subjective "I can not remember this stuff.  I'm all fogged up"   Pt states the swelling started coming back about 3-4  weeks ago.  He said it pretty much stays the same. He feelsl like its laying across his vocal cords and messing with his voice. He feels like he has swelling on the inside of his thoat too.    Pertinent History SCC of R oropharynx, p16+, Stage III,  01/28/20  CT R glossotonsillar malignancy with bulky bilateral node disease, also noted advanced cervical spine degeneration with cord compression, 02/26/20- PET hypermetabolic lesion at teh right base of tongue and righ ttonsil consistent with oropharyngeal carcinoma, bulky bilateral hypermetabolic level 2 metastatic lymph nodes no evidence of metastatic disease in chest, abdomen or pelvis will receive 35 fractions of radiation to his base of tongue and bilateral neck with weekly cisplatin, started radiation 1/24 and chemo on 1/31; PEG placed 2/2/222,   He completed chemo in March 2022 and  completed radiation to his base of tongue on 05/04/2020    Pt had a feeding tube but that is has been removed He is not having trouble with swalling earing. Past history  a fib, major stroke about 10 yrs ago, diabetes.    Patient Stated Goals to get rid of the swelling    Currently in Pain? No/denies                [facility] PT Assessment - 09/01/20 0001       Assessment   Medical Diagnosis R oropharynx cancer    Referring Provider (PT) Dr. Chryl Heck    Onset  Date/Surgical Date 01/28/20    Hand Dominance Right      Home Environment   Living Environment Private residence      Prior Function   Level of Independence Independent    Leisure pt does not do structured exercise but is able to walk around and do what he wants to do, he feels like he is still a little weak      Cognition   Overall Cognitive Status Within Functional Limits for tasks assessed      Observation/Other Assessments   Observations pt with visible swelling in anterior neck.  He has darkened skin on both sides of his neck from the radiation.    Skin Integrity he does not open area      Posture/Postural Control   Posture/Postural Control No significant limitations    Postural Limitations Rounded Shoulders;Forward head      ROM / Strength   AROM / PROM / Strength AROM      AROM   Overall AROM  Deficits    Overall AROM Comments pt  states he fell and dislocated his left shoulder about a year ago and is still having problems with it He has about 50% AROM with shoulder shrugging    AROM Assessment Site Shoulder;Cervical    Cervical Flexion WFL    Cervical Extension WFL    Cervical - Right Side Bend WFL    Cervical - Left Side Bend WFL    Cervical - Right Rotation WFL    Cervical - Left Rotation WFL      Palpation   Palpation comment firm lymphostatic fibrosis at anterior neck with some mild pitting with gentle pressure               LYMPHEDEMA/ONCOLOGY QUESTIONNAIRE - 09/01/20 0001       Type   Cancer Type SCC of propharynx      Treatment   Past Chemotherapy Treatment Yes    Past Radiation Treatment Yes      What other symptoms do you have   Are you Having Heaviness or Tightness No    Are you having Pain No    Are you having pitting edema Yes      Head and Neck   4 cm superior to sternal notch around neck 46 cm    6 cm superior to sternal notch around neck 46.5 cm    8 cm superior to sternal notch around neck 51 cm    Other 51.5   10 cm above sternal notch   Other all meaurements taken with tape at back of neck around C&                        Objective measurements completed on examination: See above findings.       [facility] Adult PT Treatment/Exercise - 09/01/20 0001       Self-Care   Self-Care Other Self-Care Comments    Other Self-Care Comments  pt was introduced to MLD and received brief MLD to chest and anterior neck. Gave pt a brochure about the Head and Neck Flexitouch but he is not sure he wants to get that. Gave pt a chip pack to wear at anterior neck for 1-2 hours a day.  Told pt that Medicare would likely not cover a compression garment but there were reasonalby priced options on [employer] and he was interested in finding out more about that  PT Education - 09/01/20 1822     Education Details wear chip pack 1-2 hours and check to see if it  helped to soften the swelling    Person(s) Educated Patient    Methods Explanation;Handout    Comprehension Verbalized understanding                 PT Long Term Goals - 09/01/20 1829       PT LONG TERM GOAL #1   Title Pt will return to baseline ROM measurements and not demonstrate any signs or symptoms of lymphedema.    Time 4    Period Weeks    Status New      PT LONG TERM GOAL #2   Title Pt will be independent in self care for management of lymphedema    Time 4    Period Weeks    Status New                    Plan - 09/01/20 1823     Clinical Impression Statement Pt presents with recent onset of anterior neck lymphedema after completing chemo and radiation for oropharynx cancer in March 2022.  He will benefit from PT for MLD and use of compression and instruction in self care. His insurance will likely not cover for compression garments, but may cover for a Flexitouch. He is not sure if he wants to do that so demographics were not sent.    Personal Factors and Comorbidities Fitness;Comorbidity 3+    Comorbidities diabetes, hx of stroke, hx of fall on L side with dislocation of L shoulder, chemo and radiation to head and neck    Examination-Activity Limitations Lift;Carry;Reach Overhead    Examination-Participation Restrictions Occupation;Yard Work;Community Activity    Stability/Clinical Decision Making Stable/Uncomplicated    Clinical Decision Making Low    Rehab Potential Good    PT Frequency 2x / week    PT Duration 4 weeks    PT Treatment/Interventions ADLs/Self Care Home Management;Patient/family education;Therapeutic exercise;Orthotic Fit/Training;Compression bandaging;Manual lymph drainage;Manual techniques;Scar mobilization;Taping;Passive range of motion    PT Next Visit Plan begin MLD. see if chip pack worked and give pt information about [employer] or other garment as indicated. Teach self MLD and faciliate Flexitouch if pt wants to use it.  consider kinesiotaping, encourage ROM and walking    Consulted and Agree with Plan of Care Patient             Patient will benefit from skilled therapeutic intervention in order to improve the following deficits and impairments:  Postural dysfunction, Decreased range of motion, Decreased knowledge of precautions, Increased fascial restricitons, Increased edema, Decreased skin integrity  Visit Diagnosis: Disorder of the skin and subcutaneous tissue related to radiation, unspecified - Plan: PT plan of care cert/re-cert  Lymphedema, not elsewhere classified - Plan: PT plan of care cert/re-cert  Abnormal posture - Plan: PT plan of care cert/re-cert  Facial weakness - Plan: PT plan of care cert/re-cert     Problem List Patient Active Problem List   Diagnosis Date Noted   Weight loss, unintentional 05/26/2020   Insomnia 05/09/2020   CKD (chronic kidney disease) 05/09/2020   Chemotherapy induced diarrhea 05/09/2020   Malignant neoplasm of base of tongue ([place]) 03/02/2020   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 Owens Shark PT   [place] Levo 09/01/2020, 6:32 PM  [place], [name], [address] Phone: [phone]   Fax:  [phone]  Name: Brian [name] MRN: [id number] Date of Birth: [birth year]

## 2020-09-06 ENCOUNTER — Ambulatory Visit: Payer: Medicare Other | Admitting: Rehabilitation

## 2020-09-07 ENCOUNTER — Other Ambulatory Visit: Payer: Self-pay

## 2020-09-07 ENCOUNTER — Other Ambulatory Visit: Payer: Self-pay | Admitting: Hematology and Oncology

## 2020-09-07 ENCOUNTER — Telehealth: Payer: Self-pay | Admitting: Pharmacist

## 2020-09-07 ENCOUNTER — Telehealth: Payer: Self-pay | Admitting: *Deleted

## 2020-09-07 DIAGNOSIS — C01 Malignant neoplasm of base of tongue: Secondary | ICD-10-CM

## 2020-09-07 NOTE — Progress Notes (Addendum)
Chronic Care Management Pharmacy Assistant   Name: Brian [name]  MRN: [id number] DOB: 07/14/1955  Reason for Encounter: Disease State For HTN.    Conditions to be addressed/monitored: HTN, Afib, Systolic HF, Type II DM w/ CKD, HLD  Recent office visits:  None since 08/18/20  Recent consult visits:  08/23/20 Oncology [name], MD. For malignant neoplasm of base of tongue. No medication changes.  Hospital visits:  None since 08/18/20  Medications: Outpatient Encounter Medications as of 09/07/2020  Medication Sig Note   amLODipine (NORVASC) 10 MG tablet Take 1 tablet (10 mg total) by mouth daily.    atorvastatin (LIPITOR) 20 MG tablet TAKE 1 TABLET (20 MG TOTAL) DAILY BY MOUTH.    Blood Glucose Monitoring Suppl (ONETOUCH VERIO IQ SYSTEM) w/Device KIT 1 each by Does not apply route 2 (two) times daily.    carvedilol (COREG) 25 MG tablet TAKE 1 TABLET BY MOUTH TWICE A DAY WITH A MEAL (Patient taking differently: Take 25 mg by mouth 2 (two) times daily with a meal.)    chlorthalidone (HYGROTON) 25 MG tablet TAKE 1 TABLET BY MOUTH EVERY DAY    cloNIDine (CATAPRES) 0.1 MG tablet Take 1 tablet (0.1 mg total) by mouth 2 (two) times daily.    Continuous Blood Gluc Receiver (FREESTYLE LIBRE 2 READER) DEVI Use as directed to monitor blood glucose continuously. DX E11.65    Continuous Blood Gluc Sensor (FREESTYLE LIBRE 2 SENSOR) MISC Use as directed to monitor blood glucose continuously. Change sensor Q 14 days. DX E11.65    glucose blood test strip 1 each by Other route as needed for other. Use as instructed    hydrALAZINE (APRESOLINE) 50 MG tablet TAKE 1 TABLET BY MOUTH THREE TIMES A DAY    Insulin Pen Needle (B-D UF III MINI PEN NEEDLES) 31G X 5 MM MISC USE DAILY WITH INSULIN    Insulin Syringe-Needle U-100 (INSULIN SYRINGE .3CC/31GX5/16") 31G X 5/16" 0.3 ML MISC Use twice daily to inject insulin.    LEVEMIR FLEXTOUCH 100 UNIT/ML FlexPen INJECT 70 UNITS UNDER THE SKIN EVERY MORNING  AND 40 UNITS AT BEDTIME 08/01/2020: Only taking 40 units qam   LORazepam (ATIVAN) 0.5 MG tablet Take 1 tablet (0.5 mg total) by mouth every 6 (six) hours as needed (Nausea or vomiting). 03/09/2020: Will begin with chemo   losartan (COZAAR) 50 MG tablet Take 1 tablet (50 mg total) by mouth daily. (Patient taking differently: Take 100 mg by mouth daily.)    magnesium oxide (MAG-OX) 400 MG tablet Take 400 mg by mouth daily.    OneTouch Delica Lancets 61Y MISC 1 each by Does not apply route 2 (two) times daily.    pioglitazone (ACTOS) 15 MG tablet Take 1 tablet (15 mg total) by mouth daily.    warfarin (COUMADIN) 10 MG tablet Take 1 tablet (10 mg total) by mouth daily.    No facility-administered encounter medications on file as of 09/07/2020.   Reviewed chart prior to disease state call. Spoke with patient regarding BP  Recent Office Vitals: BP Readings from Last 3 Encounters:  08/23/20 126/76  08/08/20 (!) 188/84  06/24/20 (!) 152/92   Pulse Readings from Last 3 Encounters:  08/23/20 71  08/08/20 84  06/24/20 82    Wt Readings from Last 3 Encounters:  08/23/20 206 lb (93.4 kg)  08/08/20 210 lb (95.3 kg)  06/24/20 206 lb (93.4 kg)     Kidney Function Lab Results  Component Value Date/Time   CREATININE 2.33 (  H) 05/09/2020 09:14 AM   CREATININE 1.90 (H) 05/02/2020 08:40 AM   CREATININE 2.03 (H) 04/25/2020 08:50 AM   CREATININE 1.23 11/30/2019 09:15 AM   CREATININE 1.32 (H) 08/03/2019 09:11 AM   GFR 57.49 (L) 02/28/2010 12:27 PM   GFRNONAA 30 (L) 05/09/2020 09:14 AM   GFRNONAA 61 11/30/2019 09:15 AM   GFRAA 70 11/30/2019 09:15 AM    BMP Latest Ref Rng & Units 05/09/2020 05/02/2020 04/25/2020  Glucose 70 - 99 mg/dL 335(H) 150(H) 198(H)  BUN 8 - 23 mg/dL 59(H) 53(H) 46(H)  Creatinine 0.61 - 1.24 mg/dL 2.33(H) 1.90(H) 2.03(H)  BUN/Creat Ratio 6 - 22 (calc) - - -  Sodium 135 - 145 mmol/L 128(L) 134(L) 132(L)  Potassium 3.5 - 5.1 mmol/L 4.3 4.3 4.3  Chloride 98 - 111 mmol/L 96(L) 100  98  CO2 22 - 32 mmol/L 24 20(L) 26  Calcium 8.9 - 10.3 mg/dL 8.7(L) 8.4(L) 8.5(L)    Current antihypertensive regimen:  Amlodipine 37m daily Carvedilol 210mBID Chlorthalidone 2537maily Clonidine 0.1mg64mice daily Hydralazine 50mg26m Losartan 50mg 59my  How often are you checking your Blood Pressure?  Patient stated he is not checking his blood pressure at all. He stated he has a blood pressure machine and knows how to use the machine but just doesn't use it.  Current home BP readings:  Patient does not check his blood pressure but I did advised the patient to at least check his blood pressure once a week.   What recent interventions/DTPs have been made by any provider to improve Blood Pressure control since last CPP Visit: None.  Any recent hospitalizations or ED visits since last visit with CPP? Patient stated no.  What diet changes have been made to improve Blood Pressure Control?  Patient stated he appetite is fair. Patient stated he does eat some healthy meals.   What exercise is being done to improve your Blood Pressure Control?  Patient stated he is not currently exercising or doing much activity.   Adherence Review: Is the patient currently on ACE/ARB medication? Losartan 50 mg   Does the patient have >5 day gap between last estimated fill dates?   Star Rating Drugs: Atorvastatin 20 mg 90 DS 06/27/20, Pioglitazone 15 mg 90 DS 06/27/20, Losartan 50 mg 90 DS 06/27/20.  Follow-Up:Pharmacist Review  VeroniCharlann LangeClinical Pharmacist Assistant [phone] spent in review, coordination, and documentation.  Reviewed by: ChristBeverly MilchmD Clinical Pharmacist [phone]

## 2020-09-07 NOTE — Telephone Encounter (Signed)
CALLED PATIENT'S SISTER- DIANE TONEY TO INFORM OF LAB FOR 09-09-20 @ 9:30 AM FOR HER BROTHER, LVM FOR A RETURN CALL

## 2020-09-08 ENCOUNTER — Ambulatory Visit: Payer: Medicare Other | Admitting: Physical Therapy

## 2020-09-08 ENCOUNTER — Other Ambulatory Visit: Payer: Self-pay

## 2020-09-08 ENCOUNTER — Encounter: Payer: Self-pay | Admitting: Physical Therapy

## 2020-09-08 DIAGNOSIS — I890 Lymphedema, not elsewhere classified: Secondary | ICD-10-CM | POA: Diagnosis not present

## 2020-09-08 DIAGNOSIS — L599 Disorder of the skin and subcutaneous tissue related to radiation, unspecified: Secondary | ICD-10-CM

## 2020-09-08 DIAGNOSIS — C01 Malignant neoplasm of base of tongue: Secondary | ICD-10-CM

## 2020-09-08 DIAGNOSIS — R29810 Facial weakness: Secondary | ICD-10-CM | POA: Diagnosis not present

## 2020-09-08 DIAGNOSIS — R293 Abnormal posture: Secondary | ICD-10-CM

## 2020-09-08 NOTE — Therapy (Signed)
[place], [name], [address] Phone: [phone]   Fax:  [phone]  Physical Therapy Treatment  Patient Details  Name: Brian [name] MRN: [id number] Date of Birth: Nov 08, 1955 Referring Provider (PT): Dr. Chryl Heck   Encounter Date: 09/08/2020   PT End of Session - 09/08/20 1055     Visit Number 2    Number of Visits 9    Date for PT Re-Evaluation 10/03/20    PT Start Time 1007    PT Stop Time 1050    PT Time Calculation (min) 43 min    Activity Tolerance Patient tolerated treatment well    Behavior During Therapy [facility] for tasks assessed/performed             Past Medical History:  Diagnosis Date   Arthritis    Atrial fibrillation ([place])    Cancer ([place])    SCC base of tongue 2021   Cardiomyopathy    Cerebrovascular accident [facility])    2012   Diabetes mellitus    Hyperlipidemia    Hypertension    Left-sided weakness    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 Zigmund Daniel, laser therapy OD   Seizures ([place])    pt reports this is from low blood sugar    Past Surgical History:  Procedure Laterality Date   DIRECT LARYNGOSCOPY N/A 02/10/2020   Procedure: DIRECT LARYNGOSCOPY WITH BIOPSY;  Surgeon: [name], MD;  Location: [place];  Service: ENT;  Laterality: N/A;   Ear surgery as a child     IR GASTROSTOMY TUBE MOD SED  03/23/2020   IR GASTROSTOMY TUBE REMOVAL  06/01/2020   IR IMAGING GUIDED PORT INSERTION  03/09/2020   TONSILLECTOMY      There were no vitals filed for this visit.   Subjective Assessment - 09/08/20 1009     Subjective I have not been wearing the chip pack at all but I will start wearing it today.    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  CT R glossotonsillar malignancy with bulky bilateral node disease, also noted advanced cervical spine degeneration with cord compression, 02/26/20- PET hypermetabolic lesion at teh right base of tongue and righ  ttonsil consistent with oropharyngeal carcinoma, bulky bilateral hypermetabolic level 2 metastatic lymph nodes no evidence of metastatic disease in chest, abdomen or pelvis will receive 35 fractions of radiation to his base of tongue and bilateral neck with weekly cisplatin, started radiation 1/24 and chemo on 1/31; PEG placed 2/2/222,   He completed chemo in March 2022 and  completed radiation to his base of tongue on 05/04/2020    Pt had a feeding tube but that is has been removed He is not having trouble with swalling earing. Past history  a fib, major stroke about 10 yrs ago, diabetes.    Patient Stated Goals to get rid of the swelling    Currently in Pain? No/denies    Pain Score 0-No pain                               [facility] Adult PT Treatment/Exercise - 09/08/20 0001       Manual Therapy   Manual Therapy Manual Lymphatic Drainage (MLD)    Manual Lymphatic Drainage (MLD) seated in front of mirror - instructed pt throughout and issued handout: short neck, 5 diaphramatic breaths (pt had difficulty with this), bilateral axillary nodes,  bilateral pectoral nodes, anterior chest, short neck, posterior, lateral and anterior neck then retracing all steps. Attempted having pt return demonstrate and he required max verbal and tactile cueing for direction of skin stretch                    PT Education - 09/08/20 1055     Education Details need for using chip pack    Person(s) Educated Patient    Methods Explanation    Comprehension Verbalized understanding                 PT Long Term Goals - 09/01/20 1829       PT LONG TERM GOAL #1   Title Pt will return to baseline ROM measurements and not demonstrate any signs or symptoms of lymphedema.    Time 4    Period Weeks    Status New      PT LONG TERM GOAL #2   Title Pt will be independent in self care for management of lymphedema    Time 4    Period Weeks    Status New                    Plan - 09/08/20 1056     Clinical Impression Statement Began instructing pt in self MLD technique using Norton anterior approach. Educated pt in correct seqeunce and skin stretch as well as anatomy and physiology of the lymphatic system. Pt reports he has not been wearing his chip pack so educated pt in the importance of compression for management of head and neck lymphedema. Issued handout to pt and educated him to begin to practice at home. Pt had difficulty demonstrating correct skin stretch technique in clinic as well as diaphragmatic breathing. Will continue to instruct pt at next appointment.    PT Frequency 2x / week    PT Duration 4 weeks    PT Treatment/Interventions ADLs/Self Care Home Management;Patient/family education;Therapeutic exercise;Orthotic Fit/Training;Compression bandaging;Manual lymph drainage;Manual techniques;Scar mobilization;Taping;Passive range of motion    PT Next Visit Plan continue MLD. see if chip pack worked and give pt information about [employer] or other garment as indicated ([employer] would be best - see if this is too expensive for pt). Teach self MLD and faciliate Flexitouch if pt wants to use it. consider kinesiotaping, encourage ROM and walking    PT Home Exercise Plan head and neck ROM, self MLD    Consulted and Agree with Plan of Care Patient             Patient will benefit from skilled therapeutic intervention in order to improve the following deficits and impairments:  Postural dysfunction, Decreased range of motion, Decreased knowledge of precautions, Increased fascial restricitons, Increased edema, Decreased skin integrity  Visit Diagnosis: Lymphedema, not elsewhere classified  Disorder of the skin and subcutaneous tissue related to radiation, unspecified  Abnormal posture  Malignant neoplasm of base of tongue ([place])     Problem List Patient Active Problem List   Diagnosis Date Noted   Weight loss, unintentional 05/26/2020   Insomnia  05/09/2020   CKD (chronic kidney disease) 05/09/2020   Chemotherapy induced diarrhea 05/09/2020   Malignant neoplasm of base of tongue ([place]) 03/02/2020   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 [facility])    Status epilepticus ([place]) 01/18/2013   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 status epilepticus, not intractable ([place]) 01/15/2013   Hyperlipidemia    Long term current use of anticoagulant 03/11/2010   Diabetes mellitus, type II ([place])  12/20/2009   Essential hypertension, benign 12/20/2009   Atrial fibrillation ([place]) 59/13/6859   Systolic heart failure ([place]) 12/20/2009   CEREBROVASCULAR ACCIDENT 12/20/2009    Allyson Sabal [facility] 09/08/2020, 10:59 AM  [place], [name], [address] Phone: [phone]   Fax:  [phone]  Name: Brian [name] MRN: [id number] Date of Birth: 11/25/1955   Manus Gunning, PT 09/08/20 10:59 AM

## 2020-09-09 ENCOUNTER — Ambulatory Visit: Payer: Medicare Other | Attending: Radiation Oncology | Admitting: Radiation Oncology

## 2020-09-09 ENCOUNTER — Telehealth: Payer: Self-pay | Admitting: *Deleted

## 2020-09-09 ENCOUNTER — Ambulatory Visit: Payer: Medicare Other

## 2020-09-09 DIAGNOSIS — Z79899 Other long term (current) drug therapy: Secondary | ICD-10-CM | POA: Insufficient documentation

## 2020-09-09 DIAGNOSIS — C108 Malignant neoplasm of overlapping sites of oropharynx: Secondary | ICD-10-CM | POA: Insufficient documentation

## 2020-09-09 NOTE — Telephone Encounter (Signed)
CALLED PATIENT 'S SISTER- DIANE TONEY DUE TO HER BROTHER MISSING HIS LAB AND FU FOR 09-09-20, LVM TO GET PATIENT RESCHEDULED

## 2020-09-13 ENCOUNTER — Telehealth: Payer: Self-pay | Admitting: *Deleted

## 2020-09-13 NOTE — Telephone Encounter (Signed)
RETURNED PATIENTS'S PHONE CALL, SPOKE WITH PATIENT

## 2020-09-14 ENCOUNTER — Other Ambulatory Visit: Payer: Self-pay

## 2020-09-14 ENCOUNTER — Ambulatory Visit: Payer: Medicare Other | Admitting: Rehabilitation

## 2020-09-14 ENCOUNTER — Encounter: Payer: Self-pay | Admitting: Rehabilitation

## 2020-09-14 DIAGNOSIS — L599 Disorder of the skin and subcutaneous tissue related to radiation, unspecified: Secondary | ICD-10-CM

## 2020-09-14 DIAGNOSIS — C01 Malignant neoplasm of base of tongue: Secondary | ICD-10-CM | POA: Diagnosis not present

## 2020-09-14 DIAGNOSIS — I890 Lymphedema, not elsewhere classified: Secondary | ICD-10-CM | POA: Diagnosis not present

## 2020-09-14 DIAGNOSIS — R293 Abnormal posture: Secondary | ICD-10-CM | POA: Diagnosis not present

## 2020-09-14 DIAGNOSIS — R29810 Facial weakness: Secondary | ICD-10-CM | POA: Diagnosis not present

## 2020-09-14 NOTE — Therapy (Signed)
[place], [name], [address] Phone: [phone]   Fax:  [phone]  Physical Therapy Treatment  Patient Details  Name: Brian [name] MRN: [id number] Date of Birth: 12-Dec-1955 Referring Provider (PT): Dr. Chryl Heck   Encounter Date: 09/14/2020   PT End of Session - 09/14/20 0946     Visit Number 3    Number of Visits 9    Date for PT Re-Evaluation 10/03/20    PT Start Time 0900    PT Stop Time 0945    PT Time Calculation (min) 45 min    Activity Tolerance Patient tolerated treatment well    Behavior During Therapy [facility] for tasks assessed/performed             Past Medical History:  Diagnosis Date   Arthritis    Atrial fibrillation ([place])    Cancer ([place])    SCC base of tongue 2021   Cardiomyopathy    Cerebrovascular accident [facility])    2012   Diabetes mellitus    Hyperlipidemia    Hypertension    Left-sided weakness    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 Zigmund Daniel, laser therapy OD   Seizures ([place])    pt reports this is from low blood sugar    Past Surgical History:  Procedure Laterality Date   DIRECT LARYNGOSCOPY N/A 02/10/2020   Procedure: DIRECT LARYNGOSCOPY WITH BIOPSY;  Surgeon: [name], MD;  Location: [place];  Service: ENT;  Laterality: N/A;   Ear surgery as a child     IR GASTROSTOMY TUBE MOD SED  03/23/2020   IR GASTROSTOMY TUBE REMOVAL  06/01/2020   IR IMAGING GUIDED PORT INSERTION  03/09/2020   TONSILLECTOMY      There were no vitals filed for this visit.   Subjective Assessment - 09/14/20 0858     Subjective nothing new.  I tried the chip pack and it seems to work. The massage is hard with the left hand.  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  CT R glossotonsillar malignancy with bulky bilateral node disease, also noted advanced cervical spine degeneration with cord compression, 02/26/20- PET hypermetabolic  lesion at teh right base of tongue and righ ttonsil consistent with oropharyngeal carcinoma, bulky bilateral hypermetabolic level 2 metastatic lymph nodes no evidence of metastatic disease in chest, abdomen or pelvis will receive 35 fractions of radiation to his base of tongue and bilateral neck with weekly cisplatin, started radiation 1/24 and chemo on 1/31; PEG placed 2/2/222,   He completed chemo in March 2022 and  completed radiation to his base of tongue on 05/04/2020    Pt had a feeding tube but that is has been removed He is not having trouble with swalling earing. Past history  a fib, major stroke about 10 yrs ago, diabetes.    Currently in Pain? No/denies                               [facility] Adult PT Treatment/Exercise - 09/14/20 0001       Manual Therapy   Manual Therapy Edema management    Manual therapy comments showed pt jovipak, marena, and swell spot and pt though swell spot would be the best for him so he was given ordering information    Manual Lymphatic Drainage (MLD) seated in front of mirror -  shortened instruction due to pt having a hard time at home due to decreased mobility in the left UE due to CVA.  shortened version on PT desk.  instructed pt throughout and issued new handout: clavicular nodes,  5 diaphramatic breaths, bilateral axillary nodes, bilateral pectoral nodes, and then milking the neck then retracing all steps. Attempted having pt return demonstrate and he required max verbal and tactile cueing for direction of skin stretch. Then PT performed the full sequence in supine HOB elevated                         PT Long Term Goals - 09/01/20 1829       PT LONG TERM GOAL #1   Title Pt will return to baseline ROM measurements and not demonstrate any signs or symptoms of lymphedema.    Time 4    Period Weeks    Status New      PT LONG TERM GOAL #2   Title Pt will be independent in self care for management of lymphedema    Time 4     Period Weeks    Status New                   Plan - 09/14/20 0946     Clinical Impression Statement pt voiced that these appts are hard as he loses alot of work time so we discussed how ind with self MLD and use of new garment will be when he can request DC.  Continued self MLD and PT MLD today with modification of sequence as pt was having a hard time with movements due to CVA and Lt UE weakness.  Pt will orer his own garment.    PT Frequency 2x / week    PT Duration 4 weeks    PT Treatment/Interventions ADLs/Self Care Home Management;Patient/family education;Therapeutic exercise;Orthotic Fit/Training;Compression bandaging;Manual lymph drainage;Manual techniques;Scar mobilization;Taping;Passive range of motion    PT Next Visit Plan continue self MLD education and by PT, order garment?, consider kinesiotaping, encourage ROM and walking    Consulted and Agree with Plan of Care Patient             Patient will benefit from skilled therapeutic intervention in order to improve the following deficits and impairments:  Postural dysfunction, Decreased range of motion, Decreased knowledge of precautions, Increased fascial restricitons, Increased edema, Decreased skin integrity  Visit Diagnosis: Lymphedema, not elsewhere classified  Disorder of the skin and subcutaneous tissue related to radiation, unspecified  Abnormal posture  Malignant neoplasm of base of tongue ([place])     Problem List Patient Active Problem List   Diagnosis Date Noted   Weight loss, unintentional 05/26/2020   Insomnia 05/09/2020   CKD (chronic kidney disease) 05/09/2020   Chemotherapy induced diarrhea 05/09/2020   Malignant neoplasm of base of tongue ([place]) 03/02/2020   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 ([place])    Status epilepticus ([place]) 01/18/2013   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 status epilepticus, not intractable ([place]) 01/15/2013   Hyperlipidemia    Long term current use of anticoagulant 03/11/2010    Diabetes mellitus, type II ([place]) 12/20/2009   Essential hypertension, benign 12/20/2009   Atrial fibrillation ([place]) 32/20/2542   Systolic heart failure ([place]) 12/20/2009   CEREBROVASCULAR ACCIDENT 12/20/2009    Stark Bray 09/14/2020, 9:49 AM  [place], [name], [address] Phone: [phone]   Fax:  [phone]  Name: ARVAL BRANDSTETTER MRN: [id number]  Date of Birth: Jan 28, 1956

## 2020-09-15 ENCOUNTER — Ambulatory Visit: Payer: Medicare Other | Admitting: Rehabilitation

## 2020-09-16 ENCOUNTER — Other Ambulatory Visit: Payer: Self-pay

## 2020-09-16 ENCOUNTER — Ambulatory Visit
Admission: RE | Admit: 2020-09-16 | Discharge: 2020-09-16 | Disposition: A | Discharge status: Home / Self Care | Payer: Medicare Other | Source: Ambulatory Visit | Attending: Radiation Oncology | Admitting: Radiation Oncology

## 2020-09-16 VITALS — BP 135/73 | HR 81 | Temp 97.9°F | Resp 24 | Ht 69.0 in | Wt 191.2 lb

## 2020-09-16 DIAGNOSIS — I890 Lymphedema, not elsewhere classified: Secondary | ICD-10-CM | POA: Diagnosis not present

## 2020-09-16 DIAGNOSIS — R918 Other nonspecific abnormal finding of lung field: Secondary | ICD-10-CM | POA: Insufficient documentation

## 2020-09-16 DIAGNOSIS — C01 Malignant neoplasm of base of tongue: Secondary | ICD-10-CM

## 2020-09-16 DIAGNOSIS — I2510 Atherosclerotic heart disease of native coronary artery without angina pectoris: Secondary | ICD-10-CM | POA: Insufficient documentation

## 2020-09-16 DIAGNOSIS — Z85819 Personal history of malignant neoplasm of unspecified site of lip, oral cavity, and pharynx: Secondary | ICD-10-CM | POA: Insufficient documentation

## 2020-09-16 DIAGNOSIS — K5730 Diverticulosis of large intestine without perforation or abscess without bleeding: Secondary | ICD-10-CM | POA: Diagnosis not present

## 2020-09-16 DIAGNOSIS — M47812 Spondylosis without myelopathy or radiculopathy, cervical region: Secondary | ICD-10-CM | POA: Diagnosis not present

## 2020-09-16 DIAGNOSIS — Z79899 Other long term (current) drug therapy: Secondary | ICD-10-CM | POA: Diagnosis not present

## 2020-09-16 DIAGNOSIS — I517 Cardiomegaly: Secondary | ICD-10-CM | POA: Diagnosis not present

## 2020-09-16 DIAGNOSIS — Z7901 Long term (current) use of anticoagulants: Secondary | ICD-10-CM | POA: Diagnosis not present

## 2020-09-16 DIAGNOSIS — C108 Malignant neoplasm of overlapping sites of oropharynx: Secondary | ICD-10-CM | POA: Diagnosis not present

## 2020-09-16 DIAGNOSIS — Z08 Encounter for follow-up examination after completed treatment for malignant neoplasm: Secondary | ICD-10-CM | POA: Diagnosis not present

## 2020-09-16 LAB — CMP (CANCER CENTER ONLY)
ALT: 13 U/L (ref 0–44)
AST: 13 U/L — ABNORMAL LOW (ref 15–41)
Albumin: 3.6 g/dL (ref 3.5–5.0)
Alkaline Phosphatase: 60 U/L (ref 38–126)
Anion gap: 11 (ref 5–15)
BUN: 53 mg/dL — ABNORMAL HIGH (ref 8–23)
CO2: 26 mmol/L (ref 22–32)
Calcium: 10.1 mg/dL (ref 8.9–10.3)
Chloride: 96 mmol/L — ABNORMAL LOW (ref 98–111)
Creatinine: 2.81 mg/dL — ABNORMAL HIGH (ref 0.61–1.24)
GFR, Estimated: 24 mL/min — ABNORMAL LOW (ref >60)
Glucose, Bld: 239 mg/dL — ABNORMAL HIGH (ref 70–99)
Potassium: 4.7 mmol/L (ref 3.5–5.1)
Sodium: 133 mmol/L — ABNORMAL LOW (ref 135–145)
Total Bilirubin: 0.4 mg/dL (ref 0.3–1.2)
Total Protein: 7.1 g/dL (ref 6.5–8.1)

## 2020-09-16 LAB — CBC WITH DIFFERENTIAL (CANCER CENTER ONLY)
Abs Immature Granulocytes: 0.02 10*3/uL (ref 0.00–0.07)
Basophils Absolute: 0 10*3/uL (ref 0.0–0.1)
Basophils Relative: 0 %
Eosinophils Absolute: 0 10*3/uL (ref 0.0–0.5)
Eosinophils Relative: 1 %
HCT: 34.1 % — ABNORMAL LOW (ref 39.0–52.0)
Hemoglobin: 11.9 g/dL — ABNORMAL LOW (ref 13.0–17.0)
Immature Granulocytes: 0 %
Lymphocytes Relative: 15 %
Lymphs Abs: 0.8 10*3/uL (ref 0.7–4.0)
MCH: 30.8 pg (ref 26.0–34.0)
MCHC: 34.9 g/dL (ref 30.0–36.0)
MCV: 88.3 fL (ref 80.0–100.0)
Monocytes Absolute: 0.7 10*3/uL (ref 0.1–1.0)
Monocytes Relative: 12 %
Neutro Abs: 3.8 10*3/uL (ref 1.7–7.7)
Neutrophils Relative %: 72 %
Platelet Count: 161 10*3/uL (ref 150–400)
RBC: 3.86 MIL/uL — ABNORMAL LOW (ref 4.22–5.81)
RDW: 11.9 % (ref 11.5–15.5)
WBC Count: 5.3 10*3/uL (ref 4.0–10.5)
nRBC: 0 % (ref 0.0–0.2)

## 2020-09-16 NOTE — Progress Notes (Signed)
Brian [name] presents today for a 2-week follow up after completing radiation to his base of tongue on 05/04/2020, and to review PET scan results from 08/26/2020  Pain issues, if any: Patient denies Using a feeding tube?: N/A--removed 06/01/2020 Weight changes, if any:  Wt Readings from Last 3 Encounters:  09/16/20 191 lb 3.2 oz (86.7 kg)  08/23/20 206 lb (93.4 kg)  08/08/20 210 lb (95.3 kg)   Swallowing issues, if any: Patient denies. Reports he can eat/drink a wide variety.  Smoking or chewing tobacco? None Using fluoride trays daily? N/A Last ENT visit was on: Not since diagnosis Other notable issues, if any: Continues deal with thick saliva (especially at night/first thing in the morning). Denies any ear or jaw pain, or difficulty opening his mouth fully. Going to PT to help with neck lymphedema management. Continues to deal with fatigue and tiring easily throughout the day. Overall reports he feels well, and is generally pleased with his progress thus far.   Vitals:   09/16/20 1459  BP: 135/73  Pulse: 81  Resp: (!) 24  Temp: 97.9 F (36.6 C)  SpO2: 99%

## 2020-09-16 NOTE — Progress Notes (Addendum)
Radiation Oncology         [phone] ________________________________  Name: Brian [name] MRN: [id number]  Date: 09/16/2020  DOB: [birth year]  Follow-Up Visit Note  CC: [name], MD  [name], MD  Diagnosis and Prior Radiotherapy:       ICD-10-CM   1. Malignant neoplasm of base of tongue ([place])  C01       Cancer Staging Malignant neoplasm of base of tongue ([place]) Staging form: Pharynx - HPV-Mediated Oropharynx, AJCC 8th Edition - Clinical stage from 03/01/2020: Stage III (cT2, cN3, cM0, p16+) - Signed by [name], MD on 03/02/2020 Stage prefix: Initial diagnosis   CHIEF COMPLAINT:  Here for follow-up and surveillance of throat cancer  Narrative:  Brian [name] presents today for a follow up after completing radiation to his base of tongue on 05/04/2020, and to review PET scan results from 08/26/2020  Pain issues, if any: Patient denies Using a feeding tube?: N/A--removed 06/01/2020 Weight changes, if any:  Wt Readings from Last 3 Encounters:  09/16/20 191 lb 3.2 oz (86.7 kg)  08/23/20 206 lb (93.4 kg)  08/08/20 210 lb (95.3 kg)   Swallowing issues, if any: Patient denies. Reports he can eat/drink a wide variety.  Smoking or chewing tobacco? None Using fluoride trays daily? N/A Last ENT visit was on: Not since diagnosis Other notable issues, if any: Continues deal with thick saliva (especially at night/first thing in the morning). Denies any ear or jaw pain, or difficulty opening his mouth fully. Going to PT to help with neck lymphedema management. Continues to deal with fatigue and tiring easily throughout the day. Overall reports he feels well, and is generally pleased with his progress thus far.   Vitals:   09/16/20 1459  BP: 135/73  Pulse: 81  Resp: (!) 24  Temp: 97.9 F (36.6 C)  SpO2: 99%     ALLERGIES:  is allergic to codeine.  Meds: Current Outpatient Medications  Medication Sig Dispense Refill   amLODipine (NORVASC) 10 MG  tablet Take 1 tablet (10 mg total) by mouth daily. 90 tablet 1   atorvastatin (LIPITOR) 20 MG tablet TAKE 1 TABLET (20 MG TOTAL) DAILY BY MOUTH. 90 tablet 3   Blood Glucose Monitoring Suppl (ONETOUCH VERIO IQ SYSTEM) w/Device KIT 1 each by Does not apply route 2 (two) times daily. 1 kit 0   carvedilol (COREG) 25 MG tablet TAKE 1 TABLET BY MOUTH TWICE A DAY WITH A MEAL (Patient taking differently: Take 25 mg by mouth 2 (two) times daily with a meal.) 180 tablet 3   chlorthalidone (HYGROTON) 25 MG tablet TAKE 1 TABLET BY MOUTH EVERY DAY 90 tablet 1   cloNIDine (CATAPRES) 0.1 MG tablet Take 1 tablet (0.1 mg total) by mouth 2 (two) times daily. 60 tablet 3   Continuous Blood Gluc Receiver (FREESTYLE LIBRE 2 READER) DEVI Use as directed to monitor blood glucose continuously. DX E11.65 1 each 1   Continuous Blood Gluc Sensor (FREESTYLE LIBRE 2 SENSOR) MISC Use as directed to monitor blood glucose continuously. Change sensor Q 14 days. DX E11.65 2 each 11   glucose blood test strip 1 each by Other route as needed for other. Use as instructed 100 each 11   hydrALAZINE (APRESOLINE) 50 MG tablet TAKE 1 TABLET BY MOUTH THREE TIMES A DAY 270 tablet 1   Insulin Pen Needle (B-D UF III MINI PEN NEEDLES) 31G X 5 MM MISC USE DAILY WITH INSULIN 100 each 5   Insulin  Syringe-Needle U-100 (INSULIN SYRINGE .3CC/31GX5/16") 31G X 5/16" 0.3 ML MISC Use twice daily to inject insulin. 100 each 10   LEVEMIR FLEXTOUCH 100 UNIT/ML FlexPen INJECT 70 UNITS UNDER THE SKIN EVERY MORNING AND 40 UNITS AT BEDTIME 15 mL 3   LORazepam (ATIVAN) 0.5 MG tablet Take 1 tablet (0.5 mg total) by mouth every 6 (six) hours as needed (Nausea or vomiting). 30 tablet 0   losartan (COZAAR) 50 MG tablet Take 1 tablet (50 mg total) by mouth daily. (Patient taking differently: Take 100 mg by mouth daily.) 90 tablet 3   magnesium oxide (MAG-OX) 400 MG tablet Take 400 mg by mouth daily.     OneTouch Delica Lancets 10O MISC 1 each by Does not apply route 2  (two) times daily. 200 each 11   pioglitazone (ACTOS) 15 MG tablet Take 1 tablet (15 mg total) by mouth daily. 90 tablet 2   warfarin (COUMADIN) 10 MG tablet Take 1 tablet (10 mg total) by mouth daily. 90 tablet 3   No current facility-administered medications for this encounter.    Physical Findings: The patient is in no acute distress. Patient is alert and oriented. Wt Readings from Last 3 Encounters:  09/16/20 191 lb 3.2 oz (86.7 kg)  08/23/20 206 lb (93.4 kg)  08/08/20 210 lb (95.3 kg)    height is _0  (1.753 m) and weight is 191 lb 3.2 oz (86.7 kg). His temperature is 97.9 F (36.6 C). His blood pressure is 135/73 and his pulse is 81. His respiration is 24 (abnormal) and oxygen saturation is 99%. .  General: Alert and oriented, in no acute distress HEENT: Head is normocephalic. Extraocular movements are intact. Oropharynx is notable for no thrush, healing mucosa, no tumor Neck: diffuse lymphedema, no obvious adenopathy to deep palpation. Skin: neck skin intact Psychiatric: Judgment and insight are intact. Affect is appropriate. Heart RRR Chest CTAB   Lab Findings: Lab Results  Component Value Date   WBC 5.3 09/16/2020   HGB 11.9 (L) 09/16/2020   HCT 34.1 (L) 09/16/2020   MCV 88.3 09/16/2020   PLT 161 09/16/2020    Lab Results  Component Value Date   TSH 1.293 03/15/2020    Radiographic Findings: NM PET Image Restag (PS) Skull Base To Thigh  Result Date: 08/26/2020 CLINICAL DATA:  Subsequent treatment strategy for tongue base cancer. EXAM: NUCLEAR MEDICINE PET SKULL BASE TO THIGH TECHNIQUE: 10.3 mCi F-18 FDG was injected intravenously. Full-ring PET imaging was performed from the skull base to thigh after the radiotracer. CT data was obtained and used for attenuation correction and anatomic localization. Fasting blood glucose: 211 mg/dl COMPARISON:  Multiple exams, including 02/26/2020 FINDINGS: Mediastinal blood pool activity: SUV max 3.6 Liver activity: SUV max NA  NECK: Interval resolution of the hypermetabolic activity associated with the right tongue base, maximum SUV in this vicinity 4.2 as compared to the contralateral side 4.0. The previous mass had a maximum SUV of 10.3. Marked reduction in the bilateral level II and level III adenopathy in the neck. Residual somewhat bandlike density in the right level II region measures 1.0 cm in thickness (formerly 4.0 cm) with maximum SUV 3.2 (formerly 13.7). Bandlike density along the left level 2 region measures 1.1 cm in thickness on image 45 series 4 (formerly 4.8 cm) with maximum SUV 4.0 (formerly 16.2). Hazy subcutaneous stranding in the chin and anterior neck region probably therapy related. Right posterior MCA distribution hypo activity corresponding to region of prior stroke/encephalomalacia. Questionable prior left occipitoparietal  stroke versus confluent chronic ischemic microvascular white matter disease. Incidental CT findings: Chronic right mastoid effusion. CHEST: No significant abnormal hypermetabolic activity in this region. Incidental CT findings: Coronary, aortic arch, and branch vessel atherosclerotic vascular disease. Right Port-A-Cath tip: Lower SVC. Mild cardiomegaly. Small right middle lobe pulmonary nodules measuring up to 0.4 cm in diameter on image 44 series 8, stable. ABDOMEN/PELVIS: Background liver activity is about 4.6, there is some speckled accentuated activity posteriorly in the lateral segment left hepatic lobe with maximum SUV 5.6, and also in the caudate lobe with maximum SUV 5.3. No CT correlate; no discrete lesion in these areas on prior PET-CT. I suspect that these areas are probably artifact but I would suggest surveillance. Scattered accentuated activity in bowel is most likely physiologic. There is some focal cecal activity with maximum SUV 9.3, more striking than on the 02/26/2020 exam, but probably physiologic. Incidental CT findings: Aortoiliac atherosclerotic vascular disease. Mild  sigmoid colon diverticulosis. SKELETON: No significant abnormal hypermetabolic activity in this region. Incidental CT findings: Calcific tendinopathy of the left infraspinatus tendon. Grade 1 degenerative retrolisthesis at L2-3 with degenerative endplate sclerosis and loss of disc height at this level. Cervical spondylosis is also noted. IMPRESSION: 1. Interval resolution of the abnormal tongue base activity. Marked reduction in size and activity of the bilateral internal jugular adenopathy. 2. Two small new foci of speckled accentuated activity in the liver are probably artifact but merit attention on follow up. If the patient has abnormal liver enzymes or if otherwise clinically warranted, this could be further worked up with short-term MRI abdomen with and without contrast. 3. Similarly, focal accentuated metabolic activity in the cecum is new and probably physiologic. If the patient's colon cancer screening history is not up-to-date than appropriate screening such as colonoscopy might be considered. 4. Other imaging findings of potential clinical significance: Aortic Atherosclerosis (ICD10-I70.0). Chronic right mastoid effusion. Coronary atherosclerosis. Mild cardiomegaly. Stable small right middle lobe pulmonary nodules. Lumbar and cervical spondylosis. Electronically Signed   By: Van Clines M.D.   On: 08/26/2020 09:14     Impression/Plan:    1) Head and Neck Cancer Status: in remission; favor PET findings to be scar tissue.  Anderson Malta RN, navigator, will navigate a CT neck, chest and liver in 24mowith f/u to see me soon after.  She will also refer back to gastroenterology for review of cecum findings on PET and consider another colonoscopy.     She will refer back to Dr OLady Saucierfor post-tx check up.  2) Nutritional Status:  tolerating oral intake, PEG removed Wt Readings from Last 3 Encounters:  09/16/20 191 lb 3.2 oz (86.7 kg)  08/23/20 206 lb (93.4 kg)  08/08/20 210 lb (95.3 kg)      3)   Swallowing: Excellent function, continue speech-language pathology exercises  4) Dental: Encouraged to continue regular followup with dentistry, and dental hygiene including fluoride products  (see #1)  5) Thyroid function: Check annually in med onc Lab Results  Component Value Date   TSH 1.293 03/15/2020    6) Other: Continue PT visits for neck edema. f/u in 3 mo w CT imaging as above, sooner PRN.  On date of service, in total, I spent 25 minutes on this encounter. Patient was seen in person. _____________________________________   SEppie Gibson MD

## 2020-09-18 ENCOUNTER — Encounter: Payer: Self-pay | Admitting: Radiation Oncology

## 2020-09-19 ENCOUNTER — Ambulatory Visit: Payer: Medicare Other | Admitting: Family Medicine

## 2020-09-19 ENCOUNTER — Other Ambulatory Visit: Payer: Self-pay

## 2020-09-19 DIAGNOSIS — C01 Malignant neoplasm of base of tongue: Secondary | ICD-10-CM

## 2020-09-19 LAB — TSH: TSH: 1.583 u[IU]/mL (ref 0.320–4.118)

## 2020-09-20 ENCOUNTER — Telehealth: Payer: Self-pay

## 2020-09-20 ENCOUNTER — Other Ambulatory Visit: Payer: Self-pay | Admitting: Hematology and Oncology

## 2020-09-20 ENCOUNTER — Ambulatory Visit: Payer: Medicare Other | Attending: Radiation Oncology | Admitting: Rehabilitation

## 2020-09-20 DIAGNOSIS — R293 Abnormal posture: Secondary | ICD-10-CM | POA: Insufficient documentation

## 2020-09-20 DIAGNOSIS — C01 Malignant neoplasm of base of tongue: Secondary | ICD-10-CM | POA: Insufficient documentation

## 2020-09-20 DIAGNOSIS — I890 Lymphedema, not elsewhere classified: Secondary | ICD-10-CM | POA: Insufficient documentation

## 2020-09-20 DIAGNOSIS — L599 Disorder of the skin and subcutaneous tissue related to radiation, unspecified: Secondary | ICD-10-CM | POA: Insufficient documentation

## 2020-09-20 DIAGNOSIS — N1832 Chronic kidney disease, stage 3b: Secondary | ICD-10-CM

## 2020-09-20 NOTE — Progress Notes (Signed)
Oncology Nurse Navigator Documentation   At the request of Dr. Isidore Moos I have scheduled Brian [name] with [facility] on 10/18/20 at 2:00 to discuss the findings on his recent PET scan. I informed Brian [name] of the appointment date/time/location. He knows to call me if he has any further needs.   Harlow Asa RN, BSN, OCN Head & Neck Oncology Nurse [place] at [facility] Phone # [phone]  Fax # [phone]

## 2020-09-20 NOTE — Telephone Encounter (Signed)
Referral faxed to [facility] with receipt of confirmation.

## 2020-09-20 NOTE — Telephone Encounter (Signed)
-----   Message from [name], MD sent at 09/20/2020  1:50 PM EDT ----- Regarding: RE: Nephrology referral [facility] sure, will place a referral  Myriam Jacobson, can u fax it to them.  Thanks, ----- Message ----- From: Ernst Spell, RN Sent: 09/20/2020  12:08 PM EDT To: [name], MD Subject: Nephrology referral                            Dr. Chryl Heck- You had asked me to see if Mr. Kohles had a Nephrologist that he has seen in the past. I called him and he told me that he had seen Dr. Corliss Parish at [facility]. I called them and they told me that he hasn't been there since 2016 so they would need a new referral to have him seen.  Thanks, [employer]

## 2020-09-22 ENCOUNTER — Other Ambulatory Visit: Payer: Self-pay | Admitting: Family Medicine

## 2020-09-22 ENCOUNTER — Other Ambulatory Visit: Payer: Self-pay

## 2020-09-22 ENCOUNTER — Ambulatory Visit: Payer: Medicare Other

## 2020-09-22 DIAGNOSIS — E1169 Type 2 diabetes mellitus with other specified complication: Secondary | ICD-10-CM

## 2020-09-22 DIAGNOSIS — I890 Lymphedema, not elsewhere classified: Secondary | ICD-10-CM

## 2020-09-22 DIAGNOSIS — Z794 Long term (current) use of insulin: Secondary | ICD-10-CM

## 2020-09-22 DIAGNOSIS — L599 Disorder of the skin and subcutaneous tissue related to radiation, unspecified: Secondary | ICD-10-CM

## 2020-09-22 DIAGNOSIS — R293 Abnormal posture: Secondary | ICD-10-CM | POA: Diagnosis not present

## 2020-09-22 DIAGNOSIS — E119 Type 2 diabetes mellitus without complications: Secondary | ICD-10-CM

## 2020-09-22 DIAGNOSIS — C01 Malignant neoplasm of base of tongue: Secondary | ICD-10-CM

## 2020-09-22 DIAGNOSIS — E669 Obesity, unspecified: Secondary | ICD-10-CM

## 2020-09-22 NOTE — Therapy (Signed)
[place], [name], [address] Phone: [phone]   Fax:  [phone]  Physical Therapy Treatment  Patient Details  Name: Brian [name] MRN: [id number] Date of Birth: [birth year] Referring Provider (PT): Dr. Chryl Heck   Encounter Date: 09/22/2020   PT End of Session - 09/22/20 0953     Visit Number 4    Number of Visits 9    Date for PT Re-Evaluation 10/03/20    PT Start Time 0909    PT Stop Time 0953    PT Time Calculation (min) 44 min    Activity Tolerance Patient tolerated treatment well    Behavior During Therapy [facility] for tasks assessed/performed             Past Medical History:  Diagnosis Date   Arthritis    Atrial fibrillation ([place])    Cancer ([place])    SCC base of tongue 2021   Cardiomyopathy    Cerebrovascular accident [facility])    2012   Diabetes mellitus    Hyperlipidemia    Hypertension    Left-sided weakness    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 Zigmund Daniel, laser therapy OD   Seizures ([place])    pt reports this is from low blood sugar    Past Surgical History:  Procedure Laterality Date   DIRECT LARYNGOSCOPY N/A 02/10/2020   Procedure: DIRECT LARYNGOSCOPY WITH BIOPSY;  Surgeon: [name], MD;  Location: [place];  Service: ENT;  Laterality: N/A;   Ear surgery as a child     IR GASTROSTOMY TUBE MOD SED  03/23/2020   IR GASTROSTOMY TUBE REMOVAL  06/01/2020   IR IMAGING GUIDED PORT INSERTION  03/09/2020   TONSILLECTOMY      There were no vitals filed for this visit.   Subjective Assessment - 09/22/20 0910     Subjective I am feeling better and I think the swelling is going down. Would like you to order the swell spot today. Wearing the chip pack a little every day. Less than an hour.    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  CT R glossotonsillar malignancy with bulky bilateral node disease, also noted advanced cervical spine degeneration with cord  compression, 02/26/20- PET hypermetabolic lesion at teh right base of tongue and righ ttonsil consistent with oropharyngeal carcinoma, bulky bilateral hypermetabolic level 2 metastatic lymph nodes no evidence of metastatic disease in chest, abdomen or pelvis will receive 35 fractions of radiation to his base of tongue and bilateral neck with weekly cisplatin, started radiation 1/24 and chemo on 1/31; PEG placed 2/2/222,   He completed chemo in March 2022 and  completed radiation to his base of tongue on 05/04/2020    Pt had a feeding tube but that is has been removed He is not having trouble with swalling earing. Past history  a fib, major stroke about 10 yrs ago, diabetes.    Patient Stated Goals to get rid of the swelling    Currently in Pain? No/denies    Pain Score 0-No pain                               [facility] Adult PT Treatment/Exercise - 09/22/20 0001       Manual Therapy   Manual therapy comments ordered swell spot for VO#[id number] (order #)    Manual Lymphatic Drainage (MLD) seated in front of mirror -  shortened instruction due to pt having a hard time at home due to decreased mobility in the left UE due to CVA.  shortened version on PT desk.  instructed pt throughout and issued new handout: clavicular nodes,  5 diaphramatic breaths, bilateral axillary nodes, bilateral pectoral nodes, and then milking the neck then retracing all steps. Attempted having pt return demonstrate and he required max verbal and tactile cueing for direction of skin stretch. Then PT performed the full sequence in supine HOB elevated                         PT Long Term Goals - 09/01/20 1829       PT LONG TERM GOAL #1   Title Pt will return to baseline ROM measurements and not demonstrate any signs or symptoms of lymphedema.    Time 4    Period Weeks    Status New      PT LONG TERM GOAL #2   Title Pt will be independent in self care for management of lymphedema    Time 4     Period Weeks    Status New                   Plan - 09/22/20 0954     Clinical Impression Statement REviewed self MLD shortened version performed by previous PT with pt sitting in front of mirror.  He required multiple verbal, visual and tactile cues to perform correctly, but did improve with practice.  Therapist completed MLD with pt in supine using longer version.  Pt requested that PT help him order his garment so this was done on [employer] site:order # [id number] with free shipping in 4-8 days. Pt encouraged to wear chip pack for longer periods of time to decrease swelling and to practice MLD daily.    Personal Factors and Comorbidities Fitness;Comorbidity 3+    Comorbidities diabetes, hx of stroke, hx of fall on L side with dislocation of L shoulder, chemo and radiation to head and neck    Examination-Activity Limitations Lift;Carry;Reach Overhead    Examination-Participation Restrictions Occupation;Yard Work;Community Activity    Stability/Clinical Decision Making Stable/Uncomplicated    Rehab Potential Good    PT Frequency 2x / week    PT Duration 4 weeks    PT Treatment/Interventions ADLs/Self Care Home Management;Patient/family education;Therapeutic exercise;Orthotic Fit/Training;Compression bandaging;Manual lymph drainage;Manual techniques;Scar mobilization;Taping;Passive range of motion    PT Next Visit Plan continue self MLD education and by PT, order garment?, consider kinesiotaping, encourage ROM and walking    PT Home Exercise Plan head and neck ROM, self MLD    Recommended Other Services ordered garment for pt today(09/22/20    Consulted and Agree with Plan of Care Patient             Patient will benefit from skilled therapeutic intervention in order to improve the following deficits and impairments:  Postural dysfunction, Decreased range of motion, Decreased knowledge of precautions, Increased fascial restricitons, Increased edema, Decreased skin  integrity  Visit Diagnosis: Lymphedema, not elsewhere classified  Disorder of the skin and subcutaneous tissue related to radiation, unspecified  Abnormal posture  Malignant neoplasm of base of tongue ([place])     Problem List Patient Active Problem List   Diagnosis Date Noted   Weight loss, unintentional 05/26/2020   Insomnia 05/09/2020   CKD (chronic kidney disease) 05/09/2020   Chemotherapy induced diarrhea 05/09/2020   Malignant neoplasm of base of tongue ([place]) 03/02/2020  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 [facility])    Status epilepticus ([place]) 01/18/2013   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 status epilepticus, not intractable ([place]) 01/15/2013   Hyperlipidemia    Long term current use of anticoagulant 03/11/2010   Diabetes mellitus, type II ([place]) 12/20/2009   Essential hypertension, benign 12/20/2009   Atrial fibrillation ([place]) 48/18/5909   Systolic heart failure ([place]) 12/20/2009   CEREBROVASCULAR ACCIDENT 12/20/2009    Elsie Ra Nikoleta Dady 09/22/2020, 10:03 AM  [place], [name], [address] Phone: [phone]   Fax:  [phone]  Name: Brian [name] MRN: [id number] Date of Birth: August 11, 1955 Cheral Almas, PT 09/22/20 10:04 AM

## 2020-09-27 ENCOUNTER — Encounter: Payer: Self-pay | Admitting: Physical Therapy

## 2020-09-27 ENCOUNTER — Ambulatory Visit: Payer: Medicare Other | Admitting: Physical Therapy

## 2020-09-27 ENCOUNTER — Other Ambulatory Visit: Payer: Self-pay

## 2020-09-27 DIAGNOSIS — C01 Malignant neoplasm of base of tongue: Secondary | ICD-10-CM | POA: Diagnosis not present

## 2020-09-27 DIAGNOSIS — L599 Disorder of the skin and subcutaneous tissue related to radiation, unspecified: Secondary | ICD-10-CM

## 2020-09-27 DIAGNOSIS — R293 Abnormal posture: Secondary | ICD-10-CM

## 2020-09-27 DIAGNOSIS — I890 Lymphedema, not elsewhere classified: Secondary | ICD-10-CM

## 2020-09-27 NOTE — Therapy (Signed)
[place], [name], [address] Phone: [phone]   Fax:  [phone]  Physical Therapy Treatment  Patient Details  Name: Brian [name] MRN: [id number] Date of Birth: [birth year] Referring Provider (PT): Dr. Chryl Heck   Encounter Date: 09/27/2020   PT End of Session - 09/27/20 1000     Visit Number 5    Number of Visits 9    Date for PT Re-Evaluation 10/03/20    PT Start Time 0914    PT Stop Time 0953    PT Time Calculation (min) 39 min    Activity Tolerance Patient tolerated treatment well    Behavior During Therapy [name] for tasks assessed/performed             Past Medical History:  Diagnosis Date   Arthritis    Atrial fibrillation ([place])    Cancer ([place])    SCC base of tongue 2021   Cardiomyopathy    Cerebrovascular accident 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 Zigmund Daniel, laser therapy OD   Seizures ([place])    pt reports this is from low blood sugar    Past Surgical History:  Procedure Laterality Date   DIRECT LARYNGOSCOPY N/A 02/10/2020   Procedure: DIRECT LARYNGOSCOPY WITH BIOPSY;  Surgeon: [name], MD;  Location: [place];  Service: ENT;  Laterality: N/A;   Ear surgery as a child     IR GASTROSTOMY TUBE MOD SED  03/23/2020   IR GASTROSTOMY TUBE REMOVAL  06/01/2020   IR IMAGING GUIDED PORT INSERTION  03/09/2020   TONSILLECTOMY      There were no vitals filed for this visit.   Subjective Assessment - 09/27/20 0915     Subjective The massage technique is going well. I have been looking for my garment but it has  not been delivered yet.    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  CT R glossotonsillar malignancy with bulky bilateral node disease, also noted advanced cervical spine degeneration with cord compression, 02/26/20- PET hypermetabolic lesion at teh right  base of tongue and righ ttonsil consistent with oropharyngeal carcinoma, bulky bilateral hypermetabolic level 2 metastatic lymph nodes no evidence of metastatic disease in chest, abdomen or pelvis will receive 35 fractions of radiation to his base of tongue and bilateral neck with weekly cisplatin, started radiation 1/24 and chemo on 1/31; PEG placed 2/2/222,   He completed chemo in March 2022 and  completed radiation to his base of tongue on 05/04/2020    Pt had a feeding tube but that is has been removed He is not having trouble with swalling earing. Past history  a fib, major stroke about 10 yrs ago, diabetes.    Patient Stated Goals to get rid of the swelling    Currently in Pain? No/denies    Pain Score 0-No pain                               [facility] Adult PT Treatment/Exercise - 09/27/20 0001       Manual Therapy   Manual Lymphatic Drainage (MLD) seated in chair - shortened instruction due to pt having a hard time at home due to decreased mobility in the left UE due to CVA.  shortened version on  PT desk.  instructed pt throughout and issued new handout: clavicular nodes,  5 diaphramatic breaths, bilateral axillary nodes, bilateral pectoral nodes, and then milking the neck then retracing all steps. Had pt return demonstrate and he required mod- max verbal and tactile cueing for direction of skin stretch. Then PT performed the full sequence in supine HOB elevated                         PT Long Term Goals - 09/01/20 1829       PT LONG TERM GOAL #1   Title Pt will return to baseline ROM measurements and not demonstrate any signs or symptoms of lymphedema.    Time 4    Period Weeks    Status New      PT LONG TERM GOAL #2   Title Pt will be independent in self care for management of lymphedema    Time 4    Period Weeks    Status New                   Plan - 09/27/20 7902     Clinical Impression Statement Continued reviewing self MLD seated  in chair using shortened MLD sequence since pt has difficulty with use of L hand due to previous stroke. Pt required mod to max verbal cues today for correct sequence and skin stretch but overall he is demonstrating increasing independence. He is still awaiting his compression garment which will also allow him to be more independent in self management.    PT Frequency 2x / week    PT Duration 4 weeks    PT Treatment/Interventions ADLs/Self Care Home Management;Patient/family education;Therapeutic exercise;Orthotic Fit/Training;Compression bandaging;Manual lymph drainage;Manual techniques;Scar mobilization;Taping;Passive range of motion    PT Next Visit Plan continue self MLD education did pt receive garment? consider kinesiotaping, encourage ROM and walking    PT Home Exercise Plan head and neck ROM, self MLD    Consulted and Agree with Plan of Care Patient             Patient will benefit from skilled therapeutic intervention in order to improve the following deficits and impairments:  Postural dysfunction, Decreased range of motion, Decreased knowledge of precautions, Increased fascial restricitons, Increased edema, Decreased skin integrity  Visit Diagnosis: Lymphedema, not elsewhere classified  Disorder of the skin and subcutaneous tissue related to radiation, unspecified  Abnormal posture  Malignant neoplasm of base of tongue ([place])     Problem List Patient Active Problem List   Diagnosis Date Noted   Weight loss, unintentional 05/26/2020   Insomnia 05/09/2020   CKD (chronic kidney disease) 05/09/2020   Chemotherapy induced diarrhea 05/09/2020   Malignant neoplasm of base of tongue ([place]) 03/02/2020   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 [facility])    Status epilepticus ([place]) 01/18/2013   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 status epilepticus, not intractable ([place]) 01/15/2013   Hyperlipidemia    Long term current use of anticoagulant 03/11/2010   Diabetes mellitus, type II ([place]) 12/20/2009    Essential hypertension, benign 12/20/2009   Atrial fibrillation ([place]) 40/97/3532   Systolic heart failure ([place]) 12/20/2009   CEREBROVASCULAR ACCIDENT 12/20/2009    Allyson Sabal [facility] 09/27/2020, 10:02 AM  [place], [name], [address] Phone: [phone]   Fax:  [phone]  Name: Brian [name] MRN: [id number] Date of Birth: 08/01/1955   Manus Gunning, PT 09/27/20 10:02 AM

## 2020-09-28 ENCOUNTER — Telehealth: Payer: Self-pay | Admitting: Pharmacist

## 2020-09-28 ENCOUNTER — Other Ambulatory Visit: Payer: Self-pay

## 2020-09-28 DIAGNOSIS — C01 Malignant neoplasm of base of tongue: Secondary | ICD-10-CM

## 2020-09-28 NOTE — Progress Notes (Addendum)
Chronic Care Management Pharmacy Assistant   Name: CHRISTEN BEDOYA  MRN: [id number] DOB: February 09, 1956  Reason for Encounter: Disease State For DM.    Conditions to be addressed/monitored: HTN, Afib, Systolic HF, Type II DM w/ CKD, HLD  Recent office visits:  None since 09/07/20  Recent consult visits:  None since 09/07/20  Hospital visits:  None since 09/07/20  Medications: Outpatient Encounter Medications as of 09/28/2020  Medication Sig Note   amLODipine (NORVASC) 10 MG tablet Take 1 tablet (10 mg total) by mouth daily.    atorvastatin (LIPITOR) 20 MG tablet TAKE 1 TABLET (20 MG TOTAL) DAILY BY MOUTH.    Blood Glucose Monitoring Suppl (ONETOUCH VERIO IQ SYSTEM) w/Device KIT 1 each by Does not apply route 2 (two) times daily.    carvedilol (COREG) 25 MG tablet TAKE 1 TABLET BY MOUTH TWICE A DAY WITH A MEAL (Patient taking differently: Take 25 mg by mouth 2 (two) times daily with a meal.)    chlorthalidone (HYGROTON) 25 MG tablet TAKE 1 TABLET BY MOUTH EVERY DAY    cloNIDine (CATAPRES) 0.1 MG tablet Take 1 tablet (0.1 mg total) by mouth 2 (two) times daily.    Continuous Blood Gluc Receiver (FREESTYLE LIBRE 2 READER) DEVI Use as directed to monitor blood glucose continuously. DX E11.65    Continuous Blood Gluc Sensor (FREESTYLE LIBRE 2 SENSOR) MISC Use as directed to monitor blood glucose continuously. Change sensor Q 14 days. DX E11.65    glucose blood test strip 1 each by Other route as needed for other. Use as instructed    hydrALAZINE (APRESOLINE) 50 MG tablet TAKE 1 TABLET BY MOUTH THREE TIMES A DAY    Insulin Pen Needle (B-D UF III MINI PEN NEEDLES) 31G X 5 MM MISC USE DAILY WITH INSULIN    Insulin Syringe-Needle U-100 (INSULIN SYRINGE .3CC/31GX5/16") 31G X 5/16" 0.3 ML MISC Use twice daily to inject insulin.    LEVEMIR FLEXTOUCH 100 UNIT/ML FlexPen INJECT 70 UNITS UNDER THE SKIN EVERY MORNING AND 40 UNITS AT BEDTIME 08/01/2020: Only taking 40 units qam   LORazepam (ATIVAN) 0.5  MG tablet Take 1 tablet (0.5 mg total) by mouth every 6 (six) hours as needed (Nausea or vomiting). 03/09/2020: Will begin with chemo   losartan (COZAAR) 100 MG tablet Take 1 tablet (100 mg total) by mouth daily.    magnesium oxide (MAG-OX) 400 MG tablet Take 400 mg by mouth daily.    OneTouch Delica Lancets 17E MISC 1 each by Does not apply route 2 (two) times daily.    pioglitazone (ACTOS) 15 MG tablet TAKE 1 TABLET BY MOUTH EVERY DAY    warfarin (COUMADIN) 10 MG tablet Take 1 tablet (10 mg total) by mouth daily.    No facility-administered encounter medications on file as of 09/28/2020.    Recent Relevant Labs: Lab Results  Component Value Date/Time   HGBA1C 7.9 (H) 06/24/2020 09:13 AM   HGBA1C 8.0 (H) 11/30/2019 09:15 AM   MICROALBUR 6.7 02/17/2019 09:15 AM   MICROALBUR CANCELED 09/06/2017 09:04 AM    Kidney Function Lab Results  Component Value Date/Time   CREATININE 2.81 (H) 09/16/2020 03:40 PM   CREATININE 2.33 (H) 05/09/2020 09:14 AM   CREATININE 1.23 11/30/2019 09:15 AM   CREATININE 1.32 (H) 08/03/2019 09:11 AM   GFR 57.49 (L) 02/28/2010 12:27 PM   GFRNONAA 24 (L) 09/16/2020 03:40 PM   GFRNONAA 61 11/30/2019 09:15 AM   GFRAA 70 11/30/2019 09:15 AM    Current  antihyperglycemic regimen:  Levemir 100u/ml 40 units am  What recent interventions/DTPs have been made to improve glycemic control:  None.   Have there been any recent hospitalizations or ED visits since last visit with CPP?  Patient stated no.  Patient denies hypoglycemic symptoms, including None  Patient denies hyperglycemic symptoms, including none  How often are you checking your blood sugar? Patient stated he does not check his blood sugar regularly. Stated he does not know how to use the machine and also does not know where the machine is.   What are your blood sugars ranging?  Patient stated he does not check his blood sugar regularly. Stated he does not know how to use the machine and also does not  know where the machine is.   During the week, how often does your blood glucose drop below 70? N/A.  Are you checking your feet daily/regularly?  Patient was made aware the importance on checking his feet regularly.   Adherence Review: Is the patient currently on a STATIN medication? Atorvastatin 20 mg   Is the patient currently on ACE/ARB medication? Losartan 100 mg  Does the patient have >5 day gap between last estimated fill dates? Per misc rpts, no.   Star Rating Drugs: Losartan 100 mg 90 DS 06/27/20, Pioglitazone 15 mg 90 DS 06/27/20, Atorvastatin 20 mg 90 DS 5/9/222.   Follow-Up:Pharmacist Review  Charlann Lange, RMA Clinical Pharmacist Assistant [phone]  10 minutes spent in review, coordination, and documentation.  Reviewed by: Beverly Milch, PharmD Clinical Pharmacist [phone]

## 2020-09-29 ENCOUNTER — Other Ambulatory Visit: Payer: Self-pay

## 2020-09-29 ENCOUNTER — Ambulatory Visit: Payer: Medicare Other | Admitting: Physical Therapy

## 2020-09-29 ENCOUNTER — Encounter: Payer: Self-pay | Admitting: Physical Therapy

## 2020-09-29 DIAGNOSIS — C01 Malignant neoplasm of base of tongue: Secondary | ICD-10-CM | POA: Diagnosis not present

## 2020-09-29 DIAGNOSIS — L599 Disorder of the skin and subcutaneous tissue related to radiation, unspecified: Secondary | ICD-10-CM

## 2020-09-29 DIAGNOSIS — I890 Lymphedema, not elsewhere classified: Secondary | ICD-10-CM | POA: Diagnosis not present

## 2020-09-29 DIAGNOSIS — R293 Abnormal posture: Secondary | ICD-10-CM

## 2020-09-29 NOTE — Therapy (Signed)
Brian [name], [name], [address] Phone: [phone]   Fax:  [phone]  Physical Therapy Treatment  Patient Details  Name: Brian [name] MRN: [id number] Date of Birth: [birth year] Referring Provider (PT): Dr. Chryl Heck   Encounter Date: 09/29/2020   PT End of Session - 09/29/20 1003     Visit Number 6    Number of Visits 9    Date for PT Re-Evaluation 10/03/20    PT Start Time 0908    PT Stop Time 0958    PT Time Calculation (min) 50 min    Activity Tolerance Patient tolerated treatment well    Behavior During Therapy [name] for tasks assessed/performed             Past Medical History:  Diagnosis Date   Arthritis    Atrial fibrillation ([place])    Cancer ([place])    SCC base of tongue 2021   Cardiomyopathy    Cerebrovascular accident [name])    2012   Diabetes mellitus    Hyperlipidemia    Hypertension    Left-sided weakness    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 Zigmund Daniel, laser therapy OD   Seizures ([place])    pt reports this is from low blood sugar    Past Surgical History:  Procedure Laterality Date   DIRECT LARYNGOSCOPY N/A 02/10/2020   Procedure: DIRECT LARYNGOSCOPY WITH BIOPSY;  Surgeon: [name], MD;  Location: [facility];  Service: ENT;  Laterality: N/A;   Ear surgery as a child     IR GASTROSTOMY TUBE MOD SED  03/23/2020   IR GASTROSTOMY TUBE REMOVAL  06/01/2020   IR IMAGING GUIDED PORT INSERTION  03/09/2020   TONSILLECTOMY      There were no vitals filed for this visit.   Subjective Assessment - 09/29/20 1002     Subjective I got my garment but I can't get the thing on.    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  CT R glossotonsillar malignancy with bulky bilateral node disease, also noted advanced cervical spine degeneration with cord compression, 02/26/20- PET hypermetabolic lesion at teh right base of tongue and righ ttonsil consistent with  oropharyngeal carcinoma, bulky bilateral hypermetabolic level 2 metastatic lymph nodes no evidence of metastatic disease in chest, abdomen or pelvis will receive 35 fractions of radiation to his base of tongue and bilateral neck with weekly cisplatin, started radiation 1/24 and chemo on 1/31; PEG placed 2/2/222,   He completed chemo in Brian 2022 and  completed radiation to his base of tongue on 05/04/2020    Pt had a feeding tube but that is has been removed He is not having trouble with swalling earing. Past history  a fib, major stroke about 10 yrs ago, diabetes.    Patient Stated Goals to get rid of the swelling    Currently in Pain? No/denies    Pain Score 0-No pain                               [facility] Adult PT Treatment/Exercise - 09/29/20 0001       Manual Therapy   Manual Therapy Edema management    Edema Management educated pt in correct donning/doffing of new solaris head and neck garment - see assessment for more details  PT Education - 09/29/20 1018     Education Details how to don/doff solaris head and neck garment    Person(s) Educated Patient    Methods Explanation;Verbal cues;Tactile cues;Demonstration    Comprehension Verbalized understanding;Returned demonstration;Verbal cues required;Tactile cues required;Need further instruction                 PT Long Term Goals - 09/01/20 1829       PT LONG TERM GOAL #1   Title Pt will return to baseline ROM measurements and not demonstrate any signs or symptoms of lymphedema.    Time 4    Period Weeks    Status New      PT LONG TERM GOAL #2   Title Pt will be independent in self care for management of lymphedema    Time 4    Period Weeks    Status New                   Plan - 09/29/20 1013     Clinical Impression Statement Spent entire session instructing pt in how to don solaris head and neck garment. Pt had a previous stroke which affected his L UE and  hand. He was unable to don garment despite multiple attempts and trying different ways to do it. Pt unable to hold his hand over his head to anchor garment while pulling the velcro across with his right hand. Eventually decided to cut the velcro strap that goes around the head so it was just one velcro strap with the chip pack. Sized the velcro strap to the pt and had pt practice donning and doffing by pulling velcro over his head. Marked the area when the velcro should stick with a piece of white velcro just in case the velcro becomes loose. Pt able to don about 50% of the time. Other times he donned the garment inside out with the chip pack facing out. Educated pt on correct way to don. Pt to practice prior to next session.    PT Frequency 2x / week    PT Duration 4 weeks    PT Treatment/Interventions ADLs/Self Care Home Management;Patient/family education;Therapeutic exercise;Orthotic Fit/Training;Compression bandaging;Manual lymph drainage;Manual techniques;Scar mobilization;Taping;Passive range of motion    PT Next Visit Plan continue self MLD education, is he able to don garment independently    PT Home Exercise Plan head and neck ROM, self MLD    Consulted and Agree with Plan of Care Patient             Patient will benefit from skilled therapeutic intervention in order to improve the following deficits and impairments:  Postural dysfunction, Decreased range of motion, Decreased knowledge of precautions, Increased fascial restricitons, Increased edema, Decreased skin integrity  Visit Diagnosis: Lymphedema, not elsewhere classified  Disorder of the skin and subcutaneous tissue related to radiation, unspecified  Abnormal posture  Malignant neoplasm of base of tongue ([place])     Problem List Patient Active Problem List   Diagnosis Date Noted   Weight loss, unintentional 05/26/2020   Insomnia 05/09/2020   CKD (chronic kidney disease) 05/09/2020   Chemotherapy induced diarrhea  05/09/2020   Malignant neoplasm of base of tongue ([place]) 03/02/2020   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 [facility])    Status epilepticus ([place]) 01/18/2013   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 status epilepticus, not intractable ([place]) 01/15/2013   Hyperlipidemia    Long term current use of anticoagulant 03/11/2010   Diabetes mellitus, type II ([place]) 12/20/2009   Essential hypertension, benign  12/20/2009   Atrial fibrillation ([place]) 29/57/4734   Systolic heart failure ([place]) 12/20/2009   CEREBROVASCULAR ACCIDENT 12/20/2009    Allyson Sabal [facility] 09/29/2020, 10:20 AM  [place], [name], [address] Phone: [phone]   Fax:  [phone]  Name: BLAKELEY MARGRAF MRN: [id number] Date of Birth: [birth year]  Manus Gunning, PT 09/29/20 10:20 AM

## 2020-10-04 ENCOUNTER — Telehealth: Payer: Self-pay | Admitting: Hematology and Oncology

## 2020-10-04 NOTE — Telephone Encounter (Signed)
R/s appts per 8/16 sch msg. Called pt, no answer and vm was full. Mailed updated calendar to pt.

## 2020-10-06 ENCOUNTER — Telehealth (HOSPITAL_COMMUNITY): Payer: Self-pay

## 2020-10-18 ENCOUNTER — Ambulatory Visit: Payer: Medicare Other | Admitting: Nurse Practitioner

## 2020-10-22 ENCOUNTER — Other Ambulatory Visit: Payer: Self-pay | Admitting: Family Medicine

## 2020-12-05 ENCOUNTER — Telehealth: Payer: Self-pay

## 2020-12-06 ENCOUNTER — Telehealth: Payer: Self-pay | Admitting: Pharmacist

## 2020-12-06 NOTE — Progress Notes (Signed)
Chronic Care Management Pharmacy Assistant   Name: Brian [name]  MRN: [id number] DOB: Jul 11, 1955  Reason for Encounter: Disease State For HTN and DM.   Conditions to be addressed/monitored: HTN, Afib, Systolic HF, Type II DM w/ CKD, HLD  Recent office visits:  None since 09/28/20  Recent consult visits:  None since 09/28/20  Hospital visits:  None since 09/28/20  Medications: Outpatient Encounter Medications as of 12/06/2020  Medication Sig Note   amLODipine (NORVASC) 10 MG tablet Take 1 tablet (10 mg total) by mouth daily.    atorvastatin (LIPITOR) 20 MG tablet TAKE 1 TABLET (20 MG TOTAL) DAILY BY MOUTH.    Blood Glucose Monitoring Suppl (ONETOUCH VERIO IQ SYSTEM) w/Device KIT 1 each by Does not apply route 2 (two) times daily.    carvedilol (COREG) 25 MG tablet TAKE 1 TABLET BY MOUTH TWICE A DAY WITH A MEAL (Patient taking differently: Take 25 mg by mouth 2 (two) times daily with a meal.)    chlorthalidone (HYGROTON) 25 MG tablet TAKE 1 TABLET BY MOUTH EVERY DAY    cloNIDine (CATAPRES) 0.1 MG tablet Take 1 tablet (0.1 mg total) by mouth 2 (two) times daily.    Continuous Blood Gluc Receiver (FREESTYLE LIBRE 2 READER) DEVI Use as directed to monitor blood glucose continuously. DX E11.65    Continuous Blood Gluc Sensor (FREESTYLE LIBRE 2 SENSOR) MISC Use as directed to monitor blood glucose continuously. Change sensor Q 14 days. DX E11.65    glucose blood test strip 1 each by Other route as needed for other. Use as instructed    hydrALAZINE (APRESOLINE) 50 MG tablet TAKE 1 TABLET BY MOUTH THREE TIMES A DAY    Insulin Pen Needle (B-D UF III MINI PEN NEEDLES) 31G X 5 MM MISC USE DAILY WITH INSULIN    Insulin Syringe-Needle U-100 (INSULIN SYRINGE .3CC/31GX5/16") 31G X 5/16" 0.3 ML MISC Use twice daily to inject insulin.    LEVEMIR FLEXTOUCH 100 UNIT/ML FlexPen INJECT 70 UNITS UNDER THE SKIN EVERY MORNING AND 40 UNITS AT BEDTIME 08/01/2020: Only taking 40 units qam   LORazepam  (ATIVAN) 0.5 MG tablet Take 1 tablet (0.5 mg total) by mouth every 6 (six) hours as needed (Nausea or vomiting). 03/09/2020: Will begin with chemo   losartan (COZAAR) 100 MG tablet Take 1 tablet (100 mg total) by mouth daily.    magnesium oxide (MAG-OX) 400 MG tablet Take 400 mg by mouth daily.    OneTouch Delica Lancets 28J MISC 1 each by Does not apply route 2 (two) times daily.    pioglitazone (ACTOS) 15 MG tablet TAKE 1 TABLET BY MOUTH EVERY DAY    warfarin (COUMADIN) 10 MG tablet Take 1 tablet (10 mg total) by mouth daily.    No facility-administered encounter medications on file as of 12/06/2020.   Reviewed chart prior to disease state call. Spoke with patient regarding BP  Recent Office Vitals: BP Readings from Last 3 Encounters:  09/16/20 135/73  08/23/20 126/76  08/08/20 (!) 188/84   Pulse Readings from Last 3 Encounters:  09/16/20 81  08/23/20 71  08/08/20 84    Wt Readings from Last 3 Encounters:  09/16/20 191 lb 3.2 oz (86.7 kg)  08/23/20 206 lb (93.4 kg)  08/08/20 210 lb (95.3 kg)     Kidney Function Lab Results  Component Value Date/Time   CREATININE 2.81 (H) 09/16/2020 03:40 PM   CREATININE 2.33 (H) 05/09/2020 09:14 AM   CREATININE 1.23 11/30/2019 09:15 AM  CREATININE 1.32 (H) 08/03/2019 09:11 AM   GFR 57.49 (L) 02/28/2010 12:27 PM   GFRNONAA 24 (L) 09/16/2020 03:40 PM   GFRNONAA 61 11/30/2019 09:15 AM   GFRAA 70 11/30/2019 09:15 AM    BMP Latest Ref Rng & Units 09/16/2020 05/09/2020 05/02/2020  Glucose 70 - 99 mg/dL 239(H) 335(H) 150(H)  BUN 8 - 23 mg/dL 53(H) 59(H) 53(H)  Creatinine 0.61 - 1.24 mg/dL 2.81(H) 2.33(H) 1.90(H)  BUN/Creat Ratio 6 - 22 (calc) - - -  Sodium 135 - 145 mmol/L 133(L) 128(L) 134(L)  Potassium 3.5 - 5.1 mmol/L 4.7 4.3 4.3  Chloride 98 - 111 mmol/L 96(L) 96(L) 100  CO2 22 - 32 mmol/L 26 24 20(L)  Calcium 8.9 - 10.3 mg/dL 10.1 8.7(L) 8.4(L)    Current antihypertensive regimen:  Amlodipine 59m daily Carvedilol 216m BID Chlorthalidone 2563maily Clonidine 0.1mg16mice daily Hydralazine 50mg26m Losartan 50mg 91my  What recent interventions/DTPs have been made by any provider to improve Blood Pressure control since last CPP Visit: None.  Any recent hospitalizations or ED visits since last visit with CPP? Patients chart does not show any hospitalizations or ER visits.   Adherence Review: Is the patient currently on ACE/ARB medication?  Losartan 100 mg  Does the patient have >5 day gap between last estimated fill dates? Per misc rpts, no.   Recent Relevant Labs: Lab Results  Component Value Date/Time   HGBA1C 7.9 (H) 06/24/2020 09:13 AM   HGBA1C 8.0 (H) 11/30/2019 09:15 AM   MICROALBUR 6.7 02/17/2019 09:15 AM   MICROALBUR CANCELED 09/06/2017 09:04 AM    Kidney Function Lab Results  Component Value Date/Time   CREATININE 2.81 (H) 09/16/2020 03:40 PM   CREATININE 2.33 (H) 05/09/2020 09:14 AM   CREATININE 1.23 11/30/2019 09:15 AM   CREATININE 1.32 (H) 08/03/2019 09:11 AM   GFR 57.49 (L) 02/28/2010 12:27 PM   GFRNONAA 24 (L) 09/16/2020 03:40 PM   GFRNONAA 61 11/30/2019 09:15 AM   GFRAA 70 11/30/2019 09:15 AM    Current antihyperglycemic regimen:  Levemir 100u/ml 40 units am  What recent interventions/DTPs have been made to improve glycemic control:  None.  Have there been any recent hospitalizations or ED visits since last visit with CPP? Patients chart does not show any hospitalizations or ER visits.   Adherence Review: Is the patient currently on a STATIN medication? Atorvastatin 20 mg  Is the patient currently on ACE/ARB medication? Losartan 100 mg  Does the patient have >5 day gap between last estimated fill dates? Per misc rpts, no.   Care Gaps:Patient is due for his foot exam, and he needs a updated labs.  Star Rating Drugs:Losartan 100 mg 10/20/20 90 DS, Pioglitazone 15 mg 10/20/20 90 DS, Atorvastatin 20 mg 10/20/20 90 DS.  Third unsuccessful telephone outreach was attempted  today. The patient was referred to the pharmacist for assistance with care management and care coordination.   Follow-Up:Pharmacist Review  VeroniCharlann LangeClPalmer Heightsacist Assistant [phone]

## 2020-12-15 ENCOUNTER — Telehealth: Payer: Self-pay | Admitting: Pharmacist

## 2020-12-15 NOTE — Progress Notes (Signed)
    Chronic Care Management Pharmacy Assistant   Name: [place] [name]  MRN: [id number] DOB: 1955/03/28  Reason for Encounter: Renewal PAP  Medications: Outpatient Encounter Medications as of 12/15/2020  Medication Sig Note   amLODipine (NORVASC) 10 MG tablet Take 1 tablet (10 mg total) by mouth daily.    atorvastatin (LIPITOR) 20 MG tablet TAKE 1 TABLET (20 MG TOTAL) DAILY BY MOUTH.    Blood Glucose Monitoring Suppl (ONETOUCH VERIO IQ SYSTEM) w/Device KIT 1 each by Does not apply route 2 (two) times daily.    carvedilol (COREG) 25 MG tablet TAKE 1 TABLET BY MOUTH TWICE A DAY WITH A MEAL (Patient taking differently: Take 25 mg by mouth 2 (two) times daily with a meal.)    chlorthalidone (HYGROTON) 25 MG tablet TAKE 1 TABLET BY MOUTH EVERY DAY    cloNIDine (CATAPRES) 0.1 MG tablet Take 1 tablet (0.1 mg total) by mouth 2 (two) times daily.    Continuous Blood Gluc Receiver (FREESTYLE LIBRE 2 READER) DEVI Use as directed to monitor blood glucose continuously. DX E11.65    Continuous Blood Gluc Sensor (FREESTYLE LIBRE 2 SENSOR) MISC Use as directed to monitor blood glucose continuously. Change sensor Q 14 days. DX E11.65    glucose blood test strip 1 each by Other route as needed for other. Use as instructed    hydrALAZINE (APRESOLINE) 50 MG tablet TAKE 1 TABLET BY MOUTH THREE TIMES A DAY    Insulin Pen Needle (B-D UF III MINI PEN NEEDLES) 31G X 5 MM MISC USE DAILY WITH INSULIN    Insulin Syringe-Needle U-100 (INSULIN SYRINGE .3CC/31GX5/16") 31G X 5/16" 0.3 ML MISC Use twice daily to inject insulin.    LEVEMIR FLEXTOUCH 100 UNIT/ML FlexPen INJECT 70 UNITS UNDER THE SKIN EVERY MORNING AND 40 UNITS AT BEDTIME 08/01/2020: Only taking 40 units qam   LORazepam (ATIVAN) 0.5 MG tablet Take 1 tablet (0.5 mg total) by mouth every 6 (six) hours as needed (Nausea or vomiting). 03/09/2020: Will begin with chemo   losartan (COZAAR) 100 MG tablet Take 1 tablet (100 mg total) by mouth daily.    magnesium oxide  (MAG-OX) 400 MG tablet Take 400 mg by mouth daily.    OneTouch Delica Lancets 50P MISC 1 each by Does not apply route 2 (two) times daily.    pioglitazone (ACTOS) 15 MG tablet TAKE 1 TABLET BY MOUTH EVERY DAY    warfarin (COUMADIN) 10 MG tablet Take 1 tablet (10 mg total) by mouth daily.    No facility-administered encounter medications on file as of 12/15/2020.   New patient assistance application form filled out to [employer] for Humalog. Waiting for patient and provider to complete and sign documentation. Called patient to inquire if they wanted the application mailed to them or if they wanted to come into the office. Patient is required to sign application and to bring/have proof of income. I was unable to reach the patient but I mailed to their residence address [place] [address].   Follow-Up:Pharmacist Review  Charlann Lange, [place] Pharmacist Assistant [phone]

## 2020-12-16 ENCOUNTER — Telehealth: Payer: Self-pay | Admitting: *Deleted

## 2020-12-16 NOTE — Telephone Encounter (Signed)
CALLED PATIENT TO INFORM OF PET SCAN FOR 12-20-20- ARRIVAL TIME- 6:30 AM @ [facility], PT. TO BE NPO- 6 HRS. PRIOR TO TEST, PATIENT TO HOLD SHORT ACTING INSULIN - 2 HRS. PRIOR TO TEST AND IF HE IS TAKING LONG-ACTING INSULIN HE SHOULD HOLD HIS INSULIN AFTER MIDNIGHT THE DAY BEFORE, PATIENT TO FU WITH DR. Isidore Moos FOR RESULTS ON 12-21-20 @ 11:30 AM DR.  LVM FOR A RETURN CALL

## 2020-12-20 ENCOUNTER — Encounter (HOSPITAL_COMMUNITY): Admission: RE | Admit: 2020-12-20 | Payer: Medicare Other | Source: Ambulatory Visit

## 2020-12-20 ENCOUNTER — Ambulatory Visit: Payer: Self-pay | Admitting: Radiation Oncology

## 2020-12-21 ENCOUNTER — Ambulatory Visit: Payer: Medicare Other | Admitting: Radiation Oncology

## 2020-12-22 ENCOUNTER — Telehealth: Payer: Self-pay | Admitting: *Deleted

## 2020-12-22 NOTE — Telephone Encounter (Signed)
CALLED PATIENT TO INFORM OF PET SCAN FOR 12-29-20 AND FU APPT. ON 01-04-21 WITH PROVIDER, LVM FOR A RETURN CALL

## 2020-12-29 ENCOUNTER — Ambulatory Visit (HOSPITAL_COMMUNITY): Admission: RE | Admit: 2020-12-29 | Payer: Medicare Other | Source: Ambulatory Visit

## 2020-12-29 ENCOUNTER — Emergency Department (HOSPITAL_COMMUNITY): Payer: Medicare Other

## 2020-12-29 ENCOUNTER — Other Ambulatory Visit: Payer: Self-pay

## 2020-12-29 ENCOUNTER — Encounter (HOSPITAL_COMMUNITY): Payer: Self-pay

## 2020-12-29 ENCOUNTER — Emergency Department (HOSPITAL_COMMUNITY)
Admission: EM | Admit: 2020-12-29 | Discharge: 2020-12-30 | Disposition: A | Discharge status: Home / Self Care | Payer: Medicare Other | Attending: Emergency Medicine | Admitting: Emergency Medicine

## 2020-12-29 DIAGNOSIS — R918 Other nonspecific abnormal finding of lung field: Secondary | ICD-10-CM | POA: Diagnosis not present

## 2020-12-29 DIAGNOSIS — R739 Hyperglycemia, unspecified: Secondary | ICD-10-CM | POA: Diagnosis not present

## 2020-12-29 DIAGNOSIS — E11319 Type 2 diabetes mellitus with unspecified diabetic retinopathy without macular edema: Secondary | ICD-10-CM | POA: Diagnosis not present

## 2020-12-29 DIAGNOSIS — Z79899 Other long term (current) drug therapy: Secondary | ICD-10-CM | POA: Diagnosis not present

## 2020-12-29 DIAGNOSIS — S299XXA Unspecified injury of thorax, initial encounter: Secondary | ICD-10-CM | POA: Diagnosis present

## 2020-12-29 DIAGNOSIS — I4891 Unspecified atrial fibrillation: Secondary | ICD-10-CM | POA: Insufficient documentation

## 2020-12-29 DIAGNOSIS — Z7984 Long term (current) use of oral hypoglycemic drugs: Secondary | ICD-10-CM | POA: Diagnosis not present

## 2020-12-29 DIAGNOSIS — Z85810 Personal history of malignant neoplasm of tongue: Secondary | ICD-10-CM | POA: Insufficient documentation

## 2020-12-29 DIAGNOSIS — S2222XA Fracture of body of sternum, initial encounter for closed fracture: Secondary | ICD-10-CM

## 2020-12-29 DIAGNOSIS — R0789 Other chest pain: Secondary | ICD-10-CM | POA: Diagnosis not present

## 2020-12-29 DIAGNOSIS — Z794 Long term (current) use of insulin: Secondary | ICD-10-CM | POA: Diagnosis not present

## 2020-12-29 DIAGNOSIS — D631 Anemia in chronic kidney disease: Secondary | ICD-10-CM | POA: Diagnosis not present

## 2020-12-29 DIAGNOSIS — Y92410 Unspecified street and highway as the place of occurrence of the external cause: Secondary | ICD-10-CM | POA: Diagnosis not present

## 2020-12-29 DIAGNOSIS — E1122 Type 2 diabetes mellitus with diabetic chronic kidney disease: Secondary | ICD-10-CM | POA: Diagnosis not present

## 2020-12-29 DIAGNOSIS — R079 Chest pain, unspecified: Secondary | ICD-10-CM | POA: Diagnosis not present

## 2020-12-29 DIAGNOSIS — Z7901 Long term (current) use of anticoagulants: Secondary | ICD-10-CM | POA: Insufficient documentation

## 2020-12-29 DIAGNOSIS — R6889 Other general symptoms and signs: Secondary | ICD-10-CM | POA: Diagnosis not present

## 2020-12-29 DIAGNOSIS — R911 Solitary pulmonary nodule: Secondary | ICD-10-CM | POA: Diagnosis not present

## 2020-12-29 DIAGNOSIS — Z743 Need for continuous supervision: Secondary | ICD-10-CM | POA: Diagnosis not present

## 2020-12-29 DIAGNOSIS — I129 Hypertensive chronic kidney disease with stage 1 through stage 4 chronic kidney disease, or unspecified chronic kidney disease: Secondary | ICD-10-CM | POA: Insufficient documentation

## 2020-12-29 DIAGNOSIS — I700 Atherosclerosis of aorta: Secondary | ICD-10-CM | POA: Diagnosis not present

## 2020-12-29 DIAGNOSIS — N189 Chronic kidney disease, unspecified: Secondary | ICD-10-CM | POA: Diagnosis not present

## 2020-12-29 LAB — BASIC METABOLIC PANEL
Anion gap: 7 (ref 5–15)
BUN: 41 mg/dL — ABNORMAL HIGH (ref 8–23)
CO2: 25 mmol/L (ref 22–32)
Calcium: 8.6 mg/dL — ABNORMAL LOW (ref 8.9–10.3)
Chloride: 102 mmol/L (ref 98–111)
Creatinine, Ser: 3 mg/dL — ABNORMAL HIGH (ref 0.61–1.24)
GFR, Estimated: 22 mL/min — ABNORMAL LOW (ref >60)
Glucose, Bld: 237 mg/dL — ABNORMAL HIGH (ref 70–99)
Potassium: 4.6 mmol/L (ref 3.5–5.1)
Sodium: 134 mmol/L — ABNORMAL LOW (ref 135–145)

## 2020-12-29 LAB — CBC
HCT: 24.7 % — ABNORMAL LOW (ref 39.0–52.0)
Hemoglobin: 8.1 g/dL — ABNORMAL LOW (ref 13.0–17.0)
MCH: 30.5 pg (ref 26.0–34.0)
MCHC: 32.8 g/dL (ref 30.0–36.0)
MCV: 92.9 fL (ref 80.0–100.0)
Platelets: 142 10*3/uL — ABNORMAL LOW (ref 150–400)
RBC: 2.66 MIL/uL — ABNORMAL LOW (ref 4.22–5.81)
RDW: 14.5 % (ref 11.5–15.5)
WBC: 4.4 10*3/uL (ref 4.0–10.5)
nRBC: 0 % (ref 0.0–0.2)

## 2020-12-29 LAB — CBG MONITORING, ED: Glucose-Capillary: 178 mg/dL — ABNORMAL HIGH (ref 70–99)

## 2020-12-29 LAB — PROTIME-INR
INR: 2.5 — ABNORMAL HIGH (ref 0.8–1.2)
Prothrombin Time: 26.7 seconds — ABNORMAL HIGH (ref 11.4–15.2)
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
## 2020-12-30 ENCOUNTER — Emergency Department (HOSPITAL_COMMUNITY): Payer: Medicare Other

## 2020-12-30 DIAGNOSIS — R918 Other nonspecific abnormal finding of lung field: Secondary | ICD-10-CM | POA: Diagnosis not present

## 2020-12-30 DIAGNOSIS — R911 Solitary pulmonary nodule: Secondary | ICD-10-CM | POA: Diagnosis not present

## 2020-12-30 DIAGNOSIS — I700 Atherosclerosis of aorta: Secondary | ICD-10-CM | POA: Diagnosis not present

## 2020-12-30 MED ORDER — HYDROCODONE-ACETAMINOPHEN 5-325 MG PO TABS: 1.0000 | ORAL_TABLET | Freq: Four times a day (QID) | ORAL | 0 refills | Status: DC | PRN
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
## 2020-12-30 NOTE — ED Notes (Signed)
Patient able to walk, strong steady gait.

## 2020-12-30 NOTE — ED Notes (Signed)
Urinal at bedside. RN asked patient to provide sample.

## 2020-12-30 NOTE — ED Notes (Signed)
Attempted x2 to call patients son, no answer.

## 2020-12-30 NOTE — ED Notes (Signed)
If pt again of urine sample pt state he will let staff know when he able to go

## 2021-01-04 ENCOUNTER — Ambulatory Visit: Payer: Medicare Other | Admitting: Radiation Oncology

## 2021-01-04 ENCOUNTER — Other Ambulatory Visit: Payer: Self-pay

## 2021-01-05 NOTE — Progress Notes (Signed)
Oncology Nurse Navigator Documentation   Mr. Kollmann presented yesterday for an appointment with Dr. Isidore Moos with his son Marjory Lies. They were not aware that it had been cancelled because Mr. Holleran had not had his PET scan done and was coming to receive results. I made Mr. Zaremba aware that I had attempted multiple times to contact him and he explained that he had lost his phone. While he was here he was rescheduled for his PET on 11/29 at 12:00 and rescheduled to see Dr. Isidore Moos for results on 12/7 at 11:20 (this date/time was his son's preference so that he can be present. At his son's request I have added his phone number in Epic contacts and requested that he be called for all future appointments.   In the past I have been asked by Dr. Isidore Moos to get Mr. Bufford to see a GI MD to follow up on past PET scan results. That appointment was made and then not attended by Mr. Franssen in August, 2022. I have rescheduled him to see a provider at [facility] on 01/23/21. He has also been referred to Nephrology in the past by Dr. Chryl Heck. I called [facility] and they requested that the patient or son call them to schedule an appointment due to several no-shows for previous appointments.   I contacted Mr. Linse son Marjory Lies today and left a voice mail message with all the above information and my direct contact number if he had any further questions or concerns.   Harlow Asa RN, BSN, OCN Head & Neck Oncology Nurse [place] at [facility] Phone # [phone]  Fax # [phone]

## 2021-01-17 ENCOUNTER — Encounter (HOSPITAL_COMMUNITY)
Admission: RE | Admit: 2021-01-17 | Discharge: 2021-01-17 | Disposition: A | Discharge status: Home / Self Care | Payer: Medicare Other | Source: Ambulatory Visit | Attending: Radiation Oncology | Admitting: Radiation Oncology

## 2021-01-17 DIAGNOSIS — C024 Malignant neoplasm of lingual tonsil: Secondary | ICD-10-CM | POA: Diagnosis not present

## 2021-01-17 DIAGNOSIS — C01 Malignant neoplasm of base of tongue: Secondary | ICD-10-CM | POA: Insufficient documentation

## 2021-01-17 DIAGNOSIS — C760 Malignant neoplasm of head, face and neck: Secondary | ICD-10-CM | POA: Diagnosis not present

## 2021-01-17 DIAGNOSIS — R918 Other nonspecific abnormal finding of lung field: Secondary | ICD-10-CM | POA: Diagnosis not present

## 2021-01-17 LAB — GLUCOSE, CAPILLARY: Glucose-Capillary: 185 mg/dL — ABNORMAL HIGH (ref 70–99)

## 2021-01-17 MED ORDER — FLUDEOXYGLUCOSE F - 18 (FDG) INJECTION
10.0000 | Freq: Once | INTRAVENOUS | Status: AC | PRN
  Administered 2021-01-17: 9.68 via INTRAVENOUS

## 2021-01-23 ENCOUNTER — Telehealth: Payer: Self-pay | Admitting: Pharmacist

## 2021-01-23 ENCOUNTER — Other Ambulatory Visit: Payer: Medicare Other

## 2021-01-23 ENCOUNTER — Other Ambulatory Visit (INDEPENDENT_AMBULATORY_CARE_PROVIDER_SITE_OTHER): Payer: Medicare Other

## 2021-01-23 ENCOUNTER — Ambulatory Visit (INDEPENDENT_AMBULATORY_CARE_PROVIDER_SITE_OTHER): Payer: Medicare Other | Admitting: Nurse Practitioner

## 2021-01-23 ENCOUNTER — Encounter: Payer: Self-pay | Admitting: Nurse Practitioner

## 2021-01-23 ENCOUNTER — Ambulatory Visit: Payer: Medicare Other | Admitting: Hematology and Oncology

## 2021-01-23 VITALS — BP 120/70 | HR 88 | Ht 69.0 in | Wt 181.0 lb

## 2021-01-23 DIAGNOSIS — D631 Anemia in chronic kidney disease: Secondary | ICD-10-CM

## 2021-01-23 DIAGNOSIS — N189 Chronic kidney disease, unspecified: Secondary | ICD-10-CM

## 2021-01-23 LAB — CBC
HCT: 27.5 % — ABNORMAL LOW (ref 39.0–52.0)
Hemoglobin: 9.3 g/dL — ABNORMAL LOW (ref 13.0–17.0)
MCHC: 33.8 g/dL (ref 30.0–36.0)
MCV: 92.5 fl (ref 78.0–100.0)
Platelets: 186 10*3/uL (ref 150.0–400.0)
RBC: 2.97 Mil/uL — ABNORMAL LOW (ref 4.22–5.81)
RDW: 15.1 % (ref 11.5–15.5)
WBC: 5.4 10*3/uL (ref 4.0–10.5)

## 2021-01-23 LAB — IRON,TIBC AND FERRITIN PANEL
%SAT: 23 % (calc) (ref 20–48)
Ferritin: 168 ng/mL (ref 24–380)
Iron: 67 ug/dL (ref 50–180)
TIBC: 293 mcg/dL (calc) (ref 250–425)

## 2021-01-23 LAB — VITAMIN B12: Vitamin B-12: 421 pg/mL (ref 211–911)
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
## 2021-01-24 NOTE — Progress Notes (Signed)
I agree with the above note, plan 

## 2021-01-25 ENCOUNTER — Other Ambulatory Visit: Payer: Self-pay

## 2021-01-25 ENCOUNTER — Ambulatory Visit
Admission: RE | Admit: 2021-01-25 | Discharge: 2021-01-25 | Disposition: A | Discharge status: Home / Self Care | Payer: Medicare Other | Source: Ambulatory Visit | Attending: Radiation Oncology | Admitting: Radiation Oncology

## 2021-01-25 ENCOUNTER — Encounter: Payer: Self-pay | Admitting: Radiation Oncology

## 2021-01-25 VITALS — BP 102/67 | HR 69 | Temp 97.2°F | Resp 20 | Ht 69.0 in | Wt 178.4 lb

## 2021-01-25 DIAGNOSIS — Z85810 Personal history of malignant neoplasm of tongue: Secondary | ICD-10-CM | POA: Diagnosis not present

## 2021-01-25 DIAGNOSIS — C01 Malignant neoplasm of base of tongue: Secondary | ICD-10-CM

## 2021-01-25 DIAGNOSIS — Z79899 Other long term (current) drug therapy: Secondary | ICD-10-CM | POA: Insufficient documentation

## 2021-01-25 DIAGNOSIS — Z923 Personal history of irradiation: Secondary | ICD-10-CM | POA: Insufficient documentation

## 2021-01-25 DIAGNOSIS — Z794 Long term (current) use of insulin: Secondary | ICD-10-CM | POA: Diagnosis not present

## 2021-01-25 DIAGNOSIS — Z08 Encounter for follow-up examination after completed treatment for malignant neoplasm: Secondary | ICD-10-CM | POA: Diagnosis not present

## 2021-01-25 DIAGNOSIS — M8580 Other specified disorders of bone density and structure, unspecified site: Secondary | ICD-10-CM | POA: Diagnosis not present
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
## 2021-01-26 ENCOUNTER — Encounter: Payer: Self-pay | Admitting: Hematology and Oncology

## 2021-01-31 ENCOUNTER — Other Ambulatory Visit: Payer: Self-pay

## 2021-01-31 ENCOUNTER — Ambulatory Visit (INDEPENDENT_AMBULATORY_CARE_PROVIDER_SITE_OTHER): Payer: Medicare Other | Admitting: Family Medicine

## 2021-01-31 VITALS — BP 138/82 | HR 77 | Ht 69.0 in | Wt 184.0 lb

## 2021-01-31 DIAGNOSIS — R55 Syncope and collapse: Secondary | ICD-10-CM

## 2021-01-31 DIAGNOSIS — I10 Essential (primary) hypertension: Secondary | ICD-10-CM | POA: Diagnosis not present

## 2021-01-31 DIAGNOSIS — I4891 Unspecified atrial fibrillation: Secondary | ICD-10-CM

## 2021-01-31 DIAGNOSIS — Z23 Encounter for immunization: Secondary | ICD-10-CM | POA: Diagnosis not present

## 2021-01-31 DIAGNOSIS — E669 Obesity, unspecified: Secondary | ICD-10-CM

## 2021-01-31 DIAGNOSIS — E1169 Type 2 diabetes mellitus with other specified complication: Secondary | ICD-10-CM

## 2021-01-31 DIAGNOSIS — N1832 Chronic kidney disease, stage 3b: Secondary | ICD-10-CM
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
## 2021-02-01 ENCOUNTER — Encounter: Payer: Self-pay | Admitting: Hematology and Oncology

## 2021-02-01 LAB — COMPLETE METABOLIC PANEL WITH GFR
AG Ratio: 1.6 (calc) (ref 1.0–2.5)
ALT: 12 U/L (ref 9–46)
AST: 11 U/L (ref 10–35)
Albumin: 3.7 g/dL (ref 3.6–5.1)
Alkaline phosphatase (APISO): 66 U/L (ref 35–144)
BUN/Creatinine Ratio: 26 (calc) — ABNORMAL HIGH (ref 6–22)
BUN: 62 mg/dL — ABNORMAL HIGH (ref 7–25)
CO2: 24 mmol/L (ref 20–32)
Calcium: 9.1 mg/dL (ref 8.6–10.3)
Chloride: 104 mmol/L (ref 98–110)
Creat: 2.35 mg/dL — ABNORMAL HIGH (ref 0.70–1.35)
Globulin: 2.3 g/dL (calc) (ref 1.9–3.7)
Glucose, Bld: 166 mg/dL — ABNORMAL HIGH (ref 65–99)
Potassium: 5.3 mmol/L (ref 3.5–5.3)
Sodium: 136 mmol/L (ref 135–146)
Total Bilirubin: 0.3 mg/dL (ref 0.2–1.2)
Total Protein: 6 g/dL — ABNORMAL LOW (ref 6.1–8.1)
eGFR: 30 mL/min/{1.73_m2} — ABNORMAL LOW (ref >=60)

## 2021-02-01 LAB — HEMOGLOBIN A1C
Hgb A1c MFr Bld: 7.7 % of total Hgb — ABNORMAL HIGH (ref <5.7)
Mean Plasma Glucose: 174 mg/dL
eAG (mmol/L): 9.7 mmol/L

## 2021-02-01 LAB — CBC WITH DIFFERENTIAL/PLATELET
Absolute Monocytes: 480 cells/uL (ref 200–950)
Basophils Absolute: 10 cells/uL (ref 0–200)
Basophils Relative: 0.2 %
Eosinophils Absolute: 98 cells/uL (ref 15–500)
Eosinophils Relative: 2 %
HCT: 26.3 % — ABNORMAL LOW (ref 38.5–50.0)
Hemoglobin: 8.6 g/dL — ABNORMAL LOW (ref 13.2–17.1)
Lymphs Abs: 760 cells/uL — ABNORMAL LOW (ref 850–3900)
MCH: 31 pg (ref 27.0–33.0)
MCHC: 32.7 g/dL (ref 32.0–36.0)
MCV: 94.9 fL (ref 80.0–100.0)
MPV: 9.3 fL (ref 7.5–12.5)
Monocytes Relative: 9.8 %
Neutro Abs: 3553 cells/uL (ref 1500–7800)
Neutrophils Relative %: 72.5 %
Platelets: 204 10*3/uL (ref 140–400)
RBC: 2.77 10*6/uL — ABNORMAL LOW (ref 4.20–5.80)
RDW: 13.2 % (ref 11.0–15.0)
Total Lymphocyte: 15.5 %
WBC: 4.9 10*3/uL (ref 3.8–10.8)

## 2021-02-02 ENCOUNTER — Encounter: Payer: Self-pay | Admitting: Cardiology

## 2021-02-02 ENCOUNTER — Encounter: Payer: Self-pay | Admitting: *Deleted

## 2021-02-02 ENCOUNTER — Ambulatory Visit (INDEPENDENT_AMBULATORY_CARE_PROVIDER_SITE_OTHER): Payer: Medicare Other | Admitting: Cardiology

## 2021-02-02 ENCOUNTER — Other Ambulatory Visit: Payer: Self-pay

## 2021-02-02 ENCOUNTER — Telehealth: Payer: Self-pay

## 2021-02-02 VITALS — BP 113/76 | HR 90 | Ht 69.0 in | Wt 182.2 lb

## 2021-02-02 DIAGNOSIS — I420 Dilated cardiomyopathy: Secondary | ICD-10-CM | POA: Insufficient documentation

## 2021-02-02 DIAGNOSIS — R55 Syncope and collapse: Secondary | ICD-10-CM | POA: Diagnosis not present

## 2021-02-02 DIAGNOSIS — I4891 Unspecified atrial fibrillation: Secondary | ICD-10-CM

## 2021-02-02 MED ORDER — TRULICITY 0.75 MG/0.5ML ~~LOC~~ SOAJ: 0.7500 mg | SUBCUTANEOUS | 0 refills | Status: DC

## 2021-02-02 NOTE — Progress Notes (Signed)
Patient ID: Brian [name], male   DOB: 1955/09/10, 65 y.o.   MRN: [id number] Patient enrolled for [employer] to ship a 30 day cardiac event monitor to his home.   Letter with instructions mailed to patient.
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
## 2021-02-02 NOTE — Patient Instructions (Signed)
Medication Instructions:  Your Physician recommend you continue on your current medication as directed.    *If you need a refill on your cardiac medications before your next appointment, please call your pharmacy*  Testing/Procedures: Your physician has requested that you have an echocardiogram. Echocardiography is a painless test that uses sound waves to create images of your heart. It provides your doctor with information about the size and shape of your heart and how well your hearts chambers and valves are working. This procedure takes approximately one hour. There are no restrictions for this procedure.  Your physician has requested that you have a carotid duplex. This test is an ultrasound of the carotid arteries in your neck. It looks at blood flow through these arteries that supply the brain with blood. Allow one hour for this exam. There are no restrictions or special instructions.  Your physician has recommended that you wear an event monitor for 30 days. Event monitors are medical devices that record the hearts electrical activity. Doctors most often [name] these monitors to diagnose arrhythmias. Arrhythmias are problems with the speed or rhythm of the heartbeat. The monitor is a small, portable device. You can wear one while you do your normal daily activities. This is usually used to diagnose what is causing palpitations/syncope (passing out).   Follow-Up: At [facility], you and your health needs are our priority.  As part of our continuing mission to provide you with exceptional heart care, we have created designated Provider Care Teams.  These Care Teams include your primary Cardiologist (physician) and Advanced Practice Providers (APPs -  Physician Assistants and Nurse Practitioners) who all work together to provide you with the care you need, when you need it.  We recommend signing up for the patient portal called "MyChart".  Sign up information is provided on this After Visit  Summary.  MyChart is used to connect with patients for Virtual Visits (Telemedicine).  Patients are able to view lab/test results, encounter notes, upcoming appointments, etc.  Non-urgent messages can be sent to your provider as well.   To learn more about what you can do with MyChart, go to [email or web address].    Your next appointment:   6 week(s)  The format for your next appointment:   In Person  Provider:   Minus Breeding, MD

## 2021-02-02 NOTE — Telephone Encounter (Signed)
Informed son of results and recommendations.
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
## 2021-02-02 NOTE — Telephone Encounter (Signed)
-----   Message from [name], MD sent at 02/02/2021  7:02 AM EST ----- Notify patient and his signed diabetes test is better than I anticipated.  Recommend starting Trulicity 4.10 mg subcu weekly.  May cause mild nausea which will subside.  In 1 month if he is tolerating the injections I will increase to 1.5 mg subcu weekly.  This should be sufficient to lower his sugars back to a normal level.  His kidney function remained stable however he is anemic likely related to his cancer treatment.  He is due to repeat an INR to monitor his Coumadin in 1 month.  Recommend rechecking lab work to monitor his sugars in 3 months to ensure that the Trulicity is working.

## 2021-02-07 ENCOUNTER — Other Ambulatory Visit: Payer: Self-pay

## 2021-02-07 ENCOUNTER — Ambulatory Visit (HOSPITAL_COMMUNITY)
Admission: RE | Admit: 2021-02-07 | Discharge: 2021-02-07 | Disposition: A | Discharge status: Home / Self Care | Payer: Medicare Other | Source: Ambulatory Visit | Attending: Cardiovascular Disease | Admitting: Cardiovascular Disease

## 2021-02-07 DIAGNOSIS — I4891 Unspecified atrial fibrillation: Secondary | ICD-10-CM | POA: Insufficient documentation

## 2021-02-07 DIAGNOSIS — I420 Dilated cardiomyopathy: Secondary | ICD-10-CM | POA: Insufficient documentation

## 2021-02-07 DIAGNOSIS — R55 Syncope and collapse: Secondary | ICD-10-CM | POA: Diagnosis not present

## 2021-02-08 ENCOUNTER — Ambulatory Visit (HOSPITAL_COMMUNITY)
Admission: RE | Admit: 2021-02-08 | Discharge: 2021-02-08 | Disposition: A | Discharge status: Home / Self Care | Payer: Medicare Other | Source: Ambulatory Visit | Attending: Radiation Oncology | Admitting: Radiation Oncology

## 2021-02-08 DIAGNOSIS — C01 Malignant neoplasm of base of tongue: Secondary | ICD-10-CM | POA: Insufficient documentation

## 2021-02-08 DIAGNOSIS — Z452 Encounter for adjustment and management of vascular access device: Secondary | ICD-10-CM | POA: Insufficient documentation

## 2021-02-08 HISTORY — PX: IR REMOVAL TUN ACCESS W/ PORT W/O FL MOD SED: IMG2290

## 2021-02-08 MED ORDER — LIDOCAINE-EPINEPHRINE (PF) 2 %-1:200000 IJ SOLN
INTRAMUSCULAR | Status: AC
  Filled 2021-02-08: qty 20

## 2021-02-08 NOTE — Procedures (Signed)
°  Procedure: R chest port catheter removal     EBL:   minimal Complications:  none immediate  See full dictation in [facility].  Dillard Cannon MD Main # [phone] Pager  [phone] Mobile [phone]

## 2021-02-09 ENCOUNTER — Encounter: Payer: Self-pay | Admitting: *Deleted

## 2021-02-14 DIAGNOSIS — E1122 Type 2 diabetes mellitus with diabetic chronic kidney disease: Secondary | ICD-10-CM | POA: Diagnosis not present

## 2021-02-14 DIAGNOSIS — C109 Malignant neoplasm of oropharynx, unspecified: Secondary | ICD-10-CM | POA: Diagnosis not present

## 2021-02-14 DIAGNOSIS — N390 Urinary tract infection, site not specified: Secondary | ICD-10-CM | POA: Diagnosis not present

## 2021-02-14 DIAGNOSIS — N184 Chronic kidney disease, stage 4 (severe): Secondary | ICD-10-CM | POA: Diagnosis not present

## 2021-02-14 DIAGNOSIS — R8281 Pyuria: Secondary | ICD-10-CM | POA: Diagnosis not present

## 2021-02-14 DIAGNOSIS — R0609 Other forms of dyspnea: Secondary | ICD-10-CM | POA: Diagnosis not present

## 2021-02-14 DIAGNOSIS — D509 Iron deficiency anemia, unspecified: Secondary | ICD-10-CM | POA: Diagnosis not present

## 2021-02-14 DIAGNOSIS — R1110 Vomiting, unspecified: Secondary | ICD-10-CM | POA: Diagnosis not present

## 2021-02-14 DIAGNOSIS — I639 Cerebral infarction, unspecified: Secondary | ICD-10-CM | POA: Diagnosis not present

## 2021-02-14 DIAGNOSIS — I129 Hypertensive chronic kidney disease with stage 1 through stage 4 chronic kidney disease, or unspecified chronic kidney disease: Secondary | ICD-10-CM | POA: Diagnosis not present

## 2021-02-14 DIAGNOSIS — N1832 Chronic kidney disease, stage 3b: Secondary | ICD-10-CM | POA: Diagnosis not present

## 2021-02-14 DIAGNOSIS — I429 Cardiomyopathy, unspecified: Secondary | ICD-10-CM | POA: Diagnosis not present

## 2021-02-14 DIAGNOSIS — I4891 Unspecified atrial fibrillation: Secondary | ICD-10-CM | POA: Diagnosis not present

## 2021-02-15 ENCOUNTER — Encounter: Payer: Self-pay | Admitting: Cardiology

## 2021-02-15 ENCOUNTER — Ambulatory Visit (INDEPENDENT_AMBULATORY_CARE_PROVIDER_SITE_OTHER): Payer: Medicare Other

## 2021-02-15 DIAGNOSIS — R55 Syncope and collapse: Secondary | ICD-10-CM

## 2021-02-15 DIAGNOSIS — I420 Dilated cardiomyopathy: Secondary | ICD-10-CM

## 2021-02-15 DIAGNOSIS — I4891 Unspecified atrial fibrillation: Secondary | ICD-10-CM | POA: Diagnosis not present

## 2021-02-22 ENCOUNTER — Telehealth: Payer: Self-pay

## 2021-02-22 MED ORDER — CARVEDILOL 25 MG PO TABS: 25.0000 mg | ORAL_TABLET | Freq: Two times a day (BID) | ORAL | 3 refills | Status: DC

## 2021-02-22 NOTE — Telephone Encounter (Signed)
Spoke to patient we received a monitor strip from yesterday 02/21/21 at 6:46 pm revealed afib with rvr rate 150.Patient was unaware he was watching tv.Stated he is not taking Carvedilol.Stated kidney Dr.stopped Carvedilol last week.Spoke to [place] she advised to restart Carvedilol 25 mg twice a day.Advised to keep appointment echo appointment and follow up appointment with Dr.Hochrein as planned.

## 2021-02-25 ENCOUNTER — Other Ambulatory Visit: Payer: Self-pay | Admitting: Family Medicine

## 2021-02-28 ENCOUNTER — Other Ambulatory Visit: Payer: Self-pay

## 2021-02-28 ENCOUNTER — Ambulatory Visit (INDEPENDENT_AMBULATORY_CARE_PROVIDER_SITE_OTHER): Payer: Medicare Other | Admitting: Family Medicine

## 2021-02-28 ENCOUNTER — Encounter: Payer: Self-pay | Admitting: Family Medicine

## 2021-02-28 VITALS — BP 122/68 | HR 76 | Temp 97.6°F | Resp 18 | Ht 69.0 in | Wt 182.0 lb

## 2021-02-28 DIAGNOSIS — Z79899 Other long term (current) drug therapy: Secondary | ICD-10-CM | POA: Diagnosis not present

## 2021-02-28 DIAGNOSIS — I4891 Unspecified atrial fibrillation: Secondary | ICD-10-CM

## 2021-02-28 DIAGNOSIS — D649 Anemia, unspecified: Secondary | ICD-10-CM | POA: Diagnosis not present

## 2021-02-28 DIAGNOSIS — N1832 Chronic kidney disease, stage 3b: Secondary | ICD-10-CM

## 2021-02-28 DIAGNOSIS — I10 Essential (primary) hypertension: Secondary | ICD-10-CM | POA: Diagnosis not present

## 2021-02-28 LAB — BASIC METABOLIC PANEL WITH GFR
BUN/Creatinine Ratio: 23 (calc) — ABNORMAL HIGH (ref 6–22)
BUN: 52 mg/dL — ABNORMAL HIGH (ref 7–25)
CO2: 24 mmol/L (ref 20–32)
Calcium: 8.6 mg/dL (ref 8.6–10.3)
Chloride: 98 mmol/L (ref 98–110)
Creat: 2.27 mg/dL — ABNORMAL HIGH (ref 0.70–1.35)
Glucose, Bld: 198 mg/dL — ABNORMAL HIGH (ref 65–99)
Potassium: 4.4 mmol/L (ref 3.5–5.3)
Sodium: 135 mmol/L (ref 135–146)
eGFR: 31 mL/min/{1.73_m2} — ABNORMAL LOW (ref >=60)

## 2021-02-28 LAB — PT WITH INR/FINGERSTICK
INR, fingerstick: >8 ratio — ABNORMAL HIGH
PT, fingerstick: >96 s — ABNORMAL HIGH (ref 10.5–13.1)

## 2021-02-28 LAB — CBC WITH DIFFERENTIAL/PLATELET
Absolute Monocytes: 442 cells/uL (ref 200–950)
Basophils Absolute: 28 cells/uL (ref 0–200)
Basophils Relative: 0.4 %
Eosinophils Absolute: 62 cells/uL (ref 15–500)
Eosinophils Relative: 0.9 %
HCT: 25.1 % — ABNORMAL LOW (ref 38.5–50.0)
Hemoglobin: 8.3 g/dL — ABNORMAL LOW (ref 13.2–17.1)
Lymphs Abs: 531 cells/uL — ABNORMAL LOW (ref 850–3900)
MCH: 30.7 pg (ref 27.0–33.0)
MCHC: 33.1 g/dL (ref 32.0–36.0)
MCV: 93 fL (ref 80.0–100.0)
MPV: 8.6 fL (ref 7.5–12.5)
Monocytes Relative: 6.4 %
Neutro Abs: 5837 cells/uL (ref 1500–7800)
Neutrophils Relative %: 84.6 %
Platelets: 347 10*3/uL (ref 140–400)
RBC: 2.7 10*6/uL — ABNORMAL LOW (ref 4.20–5.80)
RDW: 13.5 % (ref 11.0–15.0)
Total Lymphocyte: 7.7 %
WBC: 6.9 10*3/uL (ref 3.8–10.8)

## 2021-02-28 LAB — IRON: Iron: 40 ug/dL — ABNORMAL LOW (ref 50–180)

## 2021-02-28 LAB — VITAMIN B12: Vitamin B-12: 658 pg/mL (ref 200–1100)

## 2021-02-28 MED ORDER — TRULICITY 0.75 MG/0.5ML ~~LOC~~ SOAJ: 0.7500 mg | SUBCUTANEOUS | 3 refills | Status: DC
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
## 2021-03-01 ENCOUNTER — Telehealth: Payer: Self-pay

## 2021-03-01 NOTE — Telephone Encounter (Signed)
Received a critical event fax from [facility]. Showed the fax to Dr. Harl Bowie (DOD) no order given at this time.

## 2021-03-02 ENCOUNTER — Telehealth: Payer: Self-pay

## 2021-03-02 NOTE — Telephone Encounter (Signed)
Patient son aware of results and recommendations.

## 2021-03-02 NOTE — Telephone Encounter (Signed)
-----   Message from [name], MD sent at 03/02/2021  6:52 AM EST ----- Hemoglobin is stable and has not dropped or change significantly.  However his iron level is very low.  Begin ferrous sulfate 325 mg twice daily and recheck CBC in 1 month.  Also recommend checking stool for blood to see if he is losing blood.

## 2021-03-06 ENCOUNTER — Other Ambulatory Visit: Payer: Self-pay

## 2021-03-06 ENCOUNTER — Other Ambulatory Visit: Payer: Medicare Other

## 2021-03-06 DIAGNOSIS — I4891 Unspecified atrial fibrillation: Secondary | ICD-10-CM

## 2021-03-06 DIAGNOSIS — R55 Syncope and collapse: Secondary | ICD-10-CM

## 2021-03-06 DIAGNOSIS — I420 Dilated cardiomyopathy: Secondary | ICD-10-CM

## 2021-03-06 LAB — PT WITH INR/FINGERSTICK
INR, fingerstick: 1.3 ratio — ABNORMAL HIGH
PT, fingerstick: 15.2 s — ABNORMAL HIGH (ref 10.5–13.1)

## 2021-03-07 ENCOUNTER — Ambulatory Visit (HOSPITAL_COMMUNITY): Payer: Medicare Other | Attending: Cardiology

## 2021-03-07 DIAGNOSIS — I4891 Unspecified atrial fibrillation: Secondary | ICD-10-CM | POA: Diagnosis not present

## 2021-03-07 DIAGNOSIS — I420 Dilated cardiomyopathy: Secondary | ICD-10-CM | POA: Diagnosis not present

## 2021-03-07 DIAGNOSIS — R55 Syncope and collapse: Secondary | ICD-10-CM | POA: Diagnosis not present

## 2021-03-07 LAB — ECHOCARDIOGRAM COMPLETE
Area-P 1/2: 3.87 cm2
S' Lateral: 2.4 cm

## 2021-03-10 ENCOUNTER — Other Ambulatory Visit: Payer: Self-pay

## 2021-03-10 DIAGNOSIS — I4891 Unspecified atrial fibrillation: Secondary | ICD-10-CM

## 2021-03-13 ENCOUNTER — Other Ambulatory Visit: Payer: Self-pay

## 2021-03-13 ENCOUNTER — Other Ambulatory Visit: Payer: Medicare Other

## 2021-03-13 DIAGNOSIS — I4891 Unspecified atrial fibrillation: Secondary | ICD-10-CM | POA: Diagnosis not present

## 2021-03-13 DIAGNOSIS — N1832 Chronic kidney disease, stage 3b: Secondary | ICD-10-CM | POA: Insufficient documentation

## 2021-03-13 LAB — PT WITH INR/FINGERSTICK
INR, fingerstick: 1.5 ratio — ABNORMAL HIGH
PT, fingerstick: 18.5 s — ABNORMAL HIGH (ref 10.5–13.1)

## 2021-03-13 NOTE — Progress Notes (Signed)
Cardiology Office Note   Date:  03/14/2021   ID:  Brian [name], DOB Jul 05, 1955, MRN [id number]  PCP:  [name], MD  Cardiologist:   Minus Breeding, MD Referring:  [name], MD  Chief Complaint  Patient presents with   Loss of Consciousness       History of Present Illness: Brian [name] is a 66 y.o. male who is referred for evaluation of syncope.  He has a history of atrial fibrillation and cardiomyopathy.  I see an ejection fraction from 2011 of 25 to 30%.  He had a CVA treated with tPA at that time.   He had difficult to control hypertension.  I saw him last in 2013.  At that time he was said to have an ejection fraction of 60% although I do not see this result.  He did have a stress perfusion study in 2012.    He had had two syncopal spells prior to the last visit.  An echo was unremarkable.  He has had no more episodes.  He thinks in retrospect he might just fall asleep.  He is actually felt pretty well.  Of note his carotids that I did also after the last visit were unremarkable.  He is breathing fine.  He is having no new PND or orthopnea.  He has no palpitations, presyncope or syncope.  He has some chronic lower extremity swelling.   Past Medical History:  Diagnosis Date   Arthritis    Atrial fibrillation ([place])    Cancer ([place])    SCC base of tongue 2021   Cardiomyopathy    Cerebrovascular accident [facility])    2012   Diabetes mellitus    Hyperlipidemia    Hypertension    Left-sided weakness    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 ([place])    Dr. Zigmund Daniel, laser therapy OD   Seizures ([place])    pt reports this is from low blood sugar    Past Surgical History:  Procedure Laterality Date   COLONOSCOPY     DIRECT LARYNGOSCOPY N/A 02/10/2020   Procedure: DIRECT LARYNGOSCOPY WITH BIOPSY;  Surgeon: [name], MD;  Location: [place];  Service: ENT;  Laterality: N/A;   Ear surgery as a child     IR GASTROSTOMY TUBE MOD SED  03/23/2020   IR GASTROSTOMY  TUBE REMOVAL  06/01/2020   IR IMAGING GUIDED PORT INSERTION  03/09/2020   IR REMOVAL TUN ACCESS W/ PORT W/O FL MOD SED  02/08/2021   TONSILLECTOMY       Current Outpatient Medications  Medication Sig Dispense Refill   atorvastatin (LIPITOR) 20 MG tablet TAKE 1 TABLET (20 MG TOTAL) DAILY BY MOUTH. 90 tablet 3   Blood Glucose Monitoring Suppl (ONETOUCH VERIO IQ SYSTEM) w/Device KIT 1 each by Does not apply route 2 (two) times daily. 1 kit 0   carvedilol (COREG) 25 MG tablet Take 1 tablet (25 mg total) by mouth 2 (two) times daily. 180 tablet 3   Continuous Blood Gluc Receiver (FREESTYLE LIBRE 2 READER) DEVI Use as directed to monitor blood glucose continuously. DX E11.65 1 each 1   Continuous Blood Gluc Sensor (FREESTYLE LIBRE 2 SENSOR) MISC Use as directed to monitor blood glucose continuously. Change sensor Q 14 days. DX E11.65 2 each 11   Dulaglutide (TRULICITY) 5.80 DX/8.3JA SOPN Inject 0.75 mg into the skin once a week. 2 mL 3   glucose blood test strip 1 each by Other route as needed for  other. Use as instructed 100 each 11   hydrALAZINE (APRESOLINE) 50 MG tablet TAKE 1 TABLET BY MOUTH THREE TIMES A DAY 270 tablet 1   Insulin Pen Needle (B-D UF III MINI PEN NEEDLES) 31G X 5 MM MISC USE DAILY WITH INSULIN 100 each 5   Insulin Syringe-Needle U-100 (INSULIN SYRINGE .3CC/31GX5/16") 31G X 5/16" 0.3 ML MISC Use twice daily to inject insulin. 100 each 10   losartan (COZAAR) 100 MG tablet Take 1 tablet (100 mg total) by mouth daily. 90 tablet 1   magnesium oxide (MAG-OX) 400 MG tablet Take 400 mg by mouth daily.     OneTouch Delica Lancets 91M MISC 1 each by Does not apply route 2 (two) times daily. 200 each 11   pioglitazone (ACTOS) 15 MG tablet TAKE 1 TABLET BY MOUTH EVERY DAY 90 tablet 2   warfarin (COUMADIN) 10 MG tablet Take 1 tablet (10 mg total) by mouth daily. 90 tablet 3   No current facility-administered medications for this visit.    Allergies:   Codeine and Metformin and related     ROS:  Please see the history of present illness.   Otherwise, review of systems are positive for none.   All other systems are reviewed and negative.    PHYSICAL EXAM: VS:  BP 124/78 (BP Location: Left Arm, Patient Position: Sitting, Cuff Size: Normal)    Pulse 74    Ht 5' 9"  (1.753 m)    Wt 189 lb (85.7 kg)    BMI 27.91 kg/m  , BMI Body mass index is 27.91 kg/m. GENERAL:  Well appearing NECK:  No jugular venous distention, waveform within normal limits, carotid upstroke brisk and symmetric, no bruits, no thyromegaly, significant neck swelling related to previous radiation LUNGS:  Clear to auscultation bilaterally CHEST:  Unremarkable HEART:  PMI not displaced or sustained,S1 and S2 within normal limits, no S3, no S4, no clicks, no rubs, no murmurs ABD:  Flat, positive bowel sounds normal in frequency in pitch, no bruits, no rebound, no guarding, no midline pulsatile mass, no hepatomegaly, no splenomegaly EXT:  2 plus pulses throughout, moderate bilateral lower extremity edema, no cyanosis no clubbing  EKG:  EKG is not ordered today.    Recent Labs: 05/09/2020: Magnesium 1.6 09/16/2020: TSH 1.583 01/31/2021: ALT 12 02/28/2021: BUN 52; Creat 2.27; Hemoglobin 8.3; Platelets 347; Potassium 4.4; Sodium 135    Lipid Panel    Component Value Date/Time   CHOL 135 11/30/2019 0915   TRIG 170 (H) 11/30/2019 0915   HDL 29 (L) 11/30/2019 0915   CHOLHDL 4.7 11/30/2019 0915   VLDL 37 (H) 09/25/2016 0914   LDLCALC 79 11/30/2019 0915      Wt Readings from Last 3 Encounters:  03/14/21 189 lb (85.7 kg)  02/28/21 182 lb (82.6 kg)  02/02/21 182 lb 3.2 oz (82.6 kg)      Other studies Reviewed: Additional studies/ records that were reviewed today include: Echo, Doppler Review of the above records demonstrates:  Please see elsewhere in the note.     ASSESSMENT AND PLAN:  Syncope:   Its not clear that this was a frank syncopal episode.  He still does not drive until he has been through  6 months.  Not had any other episodes.  He is having trouble with his monitor and if we can get it working I will just look at the 2 weeks of data that we do have.  I do not have these reports yet.  There is  no other clear etiology.  No other work-up at this point.   CKD 3B: His creatinine was 2.27 which is down from previous.   Atrial fibrillation:   I will look at the monitor as above.  Has had no symptomatic paroxysms with this.  Cardiomyopathy:   EF was well preserved.  This is likely related to compliance with his medications.  He has had a previously reduced ejection fraction.  No change in therapy.    Current medicines are reviewed at length with the patient today.  The patient does not have concerns regarding medicines.  The following changes have been made: None  Labs/ tests ordered today include: None  No orders of the defined types were placed in this encounter.     Disposition:   FU with 6 months.      Signed, Minus Breeding, MD  03/14/2021 7:16 PM    [place]

## 2021-03-14 ENCOUNTER — Encounter: Payer: Self-pay | Admitting: Cardiology

## 2021-03-14 ENCOUNTER — Ambulatory Visit: Payer: Medicare Other | Admitting: Cardiology

## 2021-03-14 VITALS — BP 124/78 | HR 74 | Ht 69.0 in | Wt 189.0 lb

## 2021-03-14 DIAGNOSIS — N184 Chronic kidney disease, stage 4 (severe): Secondary | ICD-10-CM | POA: Diagnosis not present

## 2021-03-14 DIAGNOSIS — R55 Syncope and collapse: Secondary | ICD-10-CM | POA: Diagnosis not present

## 2021-03-14 DIAGNOSIS — N1832 Chronic kidney disease, stage 3b: Secondary | ICD-10-CM

## 2021-03-14 DIAGNOSIS — I480 Paroxysmal atrial fibrillation: Secondary | ICD-10-CM | POA: Diagnosis not present

## 2021-03-14 NOTE — Patient Instructions (Signed)
Medication Instructions:  No changes *If you need a refill on your cardiac medications before your next appointment, please call your pharmacy*   Lab Work: None ordered If you have labs (blood work) drawn today and your tests are completely normal, you will receive your results only by: [place] (if you have MyChart) OR A paper copy in the mail If you have any lab test that is abnormal or we need to change your treatment, we will call you to review the results.   Testing/Procedures: None ordered   Follow-Up: At [facility], you and your health needs are our priority.  As part of our continuing mission to provide you with exceptional heart care, we have created designated Provider Care Teams.  These Care Teams include your primary Cardiologist (physician) and Advanced Practice Providers (APPs -  Physician Assistants and Nurse Practitioners) who all work together to provide you with the care you need, when you need it.  We recommend signing up for the patient portal called "MyChart".  Sign up information is provided on this After Visit Summary.  MyChart is used to connect with patients for Virtual Visits (Telemedicine).  Patients are able to view lab/test results, encounter notes, upcoming appointments, etc.  Non-urgent messages can be sent to your provider as well.   To learn more about what you can do with MyChart, go to [email or web address].    Your next appointment:   6 month(s)  The format for your next appointment:   In Person  Provider:   APP APP

## 2021-03-17 ENCOUNTER — Encounter: Payer: Self-pay | Admitting: *Deleted

## 2021-03-20 ENCOUNTER — Encounter: Payer: Self-pay | Admitting: Hematology and Oncology

## 2021-03-20 ENCOUNTER — Inpatient Hospital Stay: Payer: Medicare Other | Attending: Hematology and Oncology

## 2021-03-20 ENCOUNTER — Other Ambulatory Visit: Payer: Self-pay | Admitting: Cardiology

## 2021-03-20 ENCOUNTER — Inpatient Hospital Stay: Payer: Medicare Other | Admitting: Hematology and Oncology

## 2021-03-20 ENCOUNTER — Other Ambulatory Visit: Payer: Self-pay

## 2021-03-20 VITALS — BP 156/84 | HR 77 | Temp 97.7°F | Resp 16 | Wt 192.6 lb

## 2021-03-20 DIAGNOSIS — Z9221 Personal history of antineoplastic chemotherapy: Secondary | ICD-10-CM | POA: Insufficient documentation

## 2021-03-20 DIAGNOSIS — C01 Malignant neoplasm of base of tongue: Secondary | ICD-10-CM

## 2021-03-20 DIAGNOSIS — R634 Abnormal weight loss: Secondary | ICD-10-CM | POA: Diagnosis not present

## 2021-03-20 DIAGNOSIS — D631 Anemia in chronic kidney disease: Secondary | ICD-10-CM | POA: Diagnosis not present

## 2021-03-20 DIAGNOSIS — R55 Syncope and collapse: Secondary | ICD-10-CM

## 2021-03-20 DIAGNOSIS — I420 Dilated cardiomyopathy: Secondary | ICD-10-CM

## 2021-03-20 DIAGNOSIS — C109 Malignant neoplasm of oropharynx, unspecified: Secondary | ICD-10-CM | POA: Insufficient documentation

## 2021-03-20 DIAGNOSIS — N189 Chronic kidney disease, unspecified: Secondary | ICD-10-CM | POA: Insufficient documentation

## 2021-03-20 DIAGNOSIS — I4891 Unspecified atrial fibrillation: Secondary | ICD-10-CM

## 2021-03-20 DIAGNOSIS — D649 Anemia, unspecified: Secondary | ICD-10-CM | POA: Diagnosis not present

## 2021-03-20 LAB — CBC WITH DIFFERENTIAL/PLATELET
Abs Immature Granulocytes: 0.02 10*3/uL (ref 0.00–0.07)
Basophils Absolute: 0 10*3/uL (ref 0.0–0.1)
Basophils Relative: 0 %
Eosinophils Absolute: 0.1 10*3/uL (ref 0.0–0.5)
Eosinophils Relative: 1 %
HCT: 27.8 % — ABNORMAL LOW (ref 39.0–52.0)
Hemoglobin: 8.8 g/dL — ABNORMAL LOW (ref 13.0–17.0)
Immature Granulocytes: 0 %
Lymphocytes Relative: 16 %
Lymphs Abs: 0.8 10*3/uL (ref 0.7–4.0)
MCH: 29.2 pg (ref 26.0–34.0)
MCHC: 31.7 g/dL (ref 30.0–36.0)
MCV: 92.4 fL (ref 80.0–100.0)
Monocytes Absolute: 0.5 10*3/uL (ref 0.1–1.0)
Monocytes Relative: 10 %
Neutro Abs: 3.8 10*3/uL (ref 1.7–7.7)
Neutrophils Relative %: 73 %
Platelets: 187 10*3/uL (ref 150–400)
RBC: 3.01 MIL/uL — ABNORMAL LOW (ref 4.22–5.81)
RDW: 13.9 % (ref 11.5–15.5)
WBC: 5.3 10*3/uL (ref 4.0–10.5)
nRBC: 0 % (ref 0.0–0.2)

## 2021-03-20 LAB — CMP (CANCER CENTER ONLY)
ALT: 13 U/L (ref 0–44)
AST: 14 U/L — ABNORMAL LOW (ref 15–41)
Albumin: 3.8 g/dL (ref 3.5–5.0)
Alkaline Phosphatase: 54 U/L (ref 38–126)
Anion gap: 8 (ref 5–15)
BUN: 41 mg/dL — ABNORMAL HIGH (ref 8–23)
CO2: 30 mmol/L (ref 22–32)
Calcium: 9.5 mg/dL (ref 8.9–10.3)
Chloride: 100 mmol/L (ref 98–111)
Creatinine: 2.1 mg/dL — ABNORMAL HIGH (ref 0.61–1.24)
GFR, Estimated: 34 mL/min — ABNORMAL LOW (ref >60)
Glucose, Bld: 169 mg/dL — ABNORMAL HIGH (ref 70–99)
Potassium: 4.8 mmol/L (ref 3.5–5.1)
Sodium: 138 mmol/L (ref 135–145)
Total Bilirubin: 0.3 mg/dL (ref 0.3–1.2)
Total Protein: 6.7 g/dL (ref 6.5–8.1)

## 2021-03-20 NOTE — Assessment & Plan Note (Signed)
He gained a weight of 10 lbs since his last visit He is not able to taste everything but he is able to swallow well and his appetite is great.  His baseline weight before initiation of treatment was around 200 pounds.

## 2021-03-20 NOTE — Assessment & Plan Note (Signed)
Hemoglobin ranges between 8 to 9 g/dL.  No evidence of iron deficiency or B12 deficiency on labs from December 2022.  Most likely this is related to his chronic kidney disease.  He should continue to be followed by his PCP.  Can be considered for Retacrit injections.

## 2021-03-20 NOTE — Assessment & Plan Note (Signed)
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 He initially received one weekly cycle of cisplatin, but given GFR issues, transitioned to weekly carboplatin. He completed 4 weekly cycles of carboplatin. Last cycle was omitted because of cytopenias and severe fatigue. He completed chemo in March 2022 End of treatment PET with no evidence of hypermetabolic activity.  There is a small left lower lobe lung nodule which was indeterminate and repeat imaging was recommended.  He has a new CT chest ordered to be scheduled in April.  He cannot quite remember the last ENT appointment exactly however this was around 3 months ago and he denies any concerns. He will return to clinic in 6 months.  Last TSH in July 2022 was normal.

## 2021-03-20 NOTE — Progress Notes (Signed)
Brian [name] NOTE  Patient Care Team: [name], MD as PCP - General (Family Medicine) Minus Breeding, MD as PCP - Cardiology (Cardiology) [name], [name], RN as Oncology Nurse Navigator [name], MD as Consulting Physician (Radiation Oncology) [name], MD as Consulting Physician (Hematology and Oncology) [name], RD as Dietitian (Nutrition) [name], [name], [name] as Speech Language Pathologist (Speech Pathology) [name], [name], PT as Physical Therapist (Physical Therapy) [name], [name] (Inactive) as Social Worker (General Practice) [name], MD as Consulting Physician (Otolaryngology) [name], [facility] as Pharmacist (Pharmacist) Minus Breeding, MD as Consulting Physician (Cardiology)  CHIEF COMPLAINTS/PURPOSE OF CONSULTATION:   SCC of oropharynx.  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 He initially received one weekly cycle of cisplatin, but given GFR issues, transitioned to weekly carboplatin. He completed 4 weekly cycles of carboplatin. Last cycle was omitted because of cytopenias and severe fatigue. He completed chemo in March 2022 End of treatment PET with no evidence of hypermetabolic activity.  There is a small left lower lobe lung nodule which was indeterminate and repeat imaging was recommended.  He has a new CT chest ordered to be scheduled in April.  He cannot quite remember the last ENT appointment exactly however this was around 3 months ago and he denies any concerns. He will return to clinic in 6 months.  Last TSH in July 2022 was normal.    Normocytic normochromic  anemia Hemoglobin ranges between 8 to 9 g/dL.  No evidence of iron deficiency or B12 deficiency on labs from December 2022.  Most likely this is related to his chronic kidney disease.  He should continue to be followed by his PCP.  Can be considered for Retacrit injections.  Weight loss, unintentional He gained a weight of 10 lbs since his last visit He is not [name] to taste everything but he is [name] to swallow well and his appetite is great.  His baseline weight before initiation of treatment was around 200 pounds.  Orders Placed This Encounter  Procedures   TSH    Standing Status:   Standing    Number of Occurrences:   22    Standing Expiration Date:   03/20/2022   Recommend follow-up with PCP and nephrology for chronic issues. Recommend follow-up with ENT for surveillance of squamous cell carcinoma of oropharynx. Return to clinic with [name] as to see Mendel Ryder in head and neck survivorship clinic in 6 months. Standing orders for TSH placed, recommend at least annual evaluation of TSH along with CBC and CMP.  [name] MD   HISTORY OF PRESENTING ILLNESS:   Oncology History Overview Note  Brian [name] is a 66 y.o. male who presented two-week history of facial swelling in late November/early December 2021. Subsequently, the patient saw Dr. Benjamine Mola, who performed a direct laryngoscopy with biopsy on the date of 02/10/2020. Final pathology revealed: squamous cell carcinoma, P16 positive   He had the following imaging performed  1. Soft tissue neck CT scan performed on 01/28/2020 revealing right glossotonsillar malignancy with bulky bilateral nodal disease. Nodal masses sowed definite extracapsular spread. There was also noted to be incidental advanced cervical spine degeneration with cord compression.  2. PET  scan performed on 02/26/2020 revealing a hypermetabolic lesion at the right base of the tongue and right tonsil, consistent with oropharyngeal carcinoma. Carcinoma appeared to be  confined to the mucosal surface. There were also noted to bulky bilateral hypermetabolic level II metastatic lymph nodes and smaller inferior hypermetabolic level II metastatic lymph nodes. There was no evidence of metastatic disease in the chest, abdomen, or pelvis. The two small right middle lobe pulmonary nodules present on CT from 2012 were consistent with benign etiology.  Started radiation on 03/14/2020 Chemo delayed by a week due to GFR and infusion issues. Started chemotherapy on 03/21/2020 C2 delayed because of AKI Now switched to weekly carboplatin given ongoing AKI C1 of weekly carboplatin on 04/04/2020 C2 of weekly carboplatin on 04/11/2020 C3 of weekly carboplatin on 04/18/2020 C4 Weekly carboplatin on 04/25/2020 Last planned cycle omitted.   Malignant neoplasm of base of tongue ([place])  03/01/2020 Cancer Staging   Staging form: Pharynx - HPV-Mediated Oropharynx, AJCC 8th Edition - Clinical stage from 03/01/2020: Stage III (cT2, cN3, cM0, p16+) - Signed by [name], MD on 03/02/2020    03/02/2020 Initial Diagnosis   Malignant neoplasm of base of tongue ([place])   03/21/2020 - 03/28/2020 Chemotherapy          04/04/2020 -  Chemotherapy   Completed chemotherapy on 04/25/2020  Patient is on Treatment Plan: HEAD/NECK CARBOPLATIN Q7D        Interim History  He is here for a follow up. He still deals with dysguesia, but appetite is great. He has gained 10 lbs since last visit. He says hearing is not good, cant tell if it was there prior to treatment. He denies any neuropathy. No cough, chest pain, SOB NO change in bowel or urinary habits Lymphedema of neck, doing massages. Rest of the pertinent 10 point ROS reviewed and neg.  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 Zigmund Daniel, laser therapy OD   Seizures ([place])    pt reports this is from low blood sugar    SURGICAL HISTORY: Past Surgical History:  Procedure Laterality Date   COLONOSCOPY     DIRECT LARYNGOSCOPY N/A 02/10/2020   Procedure: DIRECT LARYNGOSCOPY WITH BIOPSY;  Surgeon: [name], MD;  Location: [place];  Service: ENT;  Laterality: N/A;   Ear surgery as a child     IR GASTROSTOMY TUBE MOD SED  03/23/2020   IR GASTROSTOMY TUBE REMOVAL  06/01/2020   IR IMAGING GUIDED PORT INSERTION  03/09/2020   IR REMOVAL TUN ACCESS W/ PORT W/O FL MOD SED  02/08/2021   TONSILLECTOMY      SOCIAL HISTORY: Social History   Socioeconomic History   Marital status: Divorced    Spouse name: Not on file   Number of children: 2   Years of education: Not on file   Highest education level: Not on file  Occupational History   Occupation: Disabled  Tobacco Use   Smoking status: Never   Smokeless tobacco: Never  Vaping Use   Vaping Use: Never used  Substance and Sexual Activity   Alcohol use: Yes    Alcohol/week:  0.0 standard drinks    Comment: seldom   Drug use: Yes    Frequency: 7.0 times per week    Comment: marijauna for arthritis, last 03-13-20   Sexual activity: Not Currently  Other Topics Concern   Not on file  Social History Narrative   Two children.  5 grandchildren.     Social Determinants of Health   Financial Resource Strain: Not on file  Food Insecurity: Not on file  Transportation Needs: Not on file  Physical Activity: Not on file  Stress: Not on file  Social Connections: Not on file  Intimate Partner Violence: Not on file    FAMILY HISTORY: Family History  Problem Relation Age of Onset   Colon cancer Mother    Hyperlipidemia Father    Hypertension Father    Breast cancer Sister    Coronary artery disease Neg Hx        no early CAD.   Stomach cancer Neg Hx    Rectal cancer Neg Hx     Esophageal cancer Neg Hx     ALLERGIES:  is allergic to codeine and metformin and related.  MEDICATIONS:  Current Outpatient Medications  Medication Sig Dispense Refill   atorvastatin (LIPITOR) 20 MG tablet TAKE 1 TABLET (20 MG TOTAL) DAILY BY MOUTH. 90 tablet 3   Blood Glucose Monitoring Suppl (ONETOUCH VERIO IQ SYSTEM) w/Device KIT 1 each by Does not apply route 2 (two) times daily. 1 kit 0   carvedilol (COREG) 25 MG tablet Take 1 tablet (25 mg total) by mouth 2 (two) times daily. 180 tablet 3   Continuous Blood Gluc Receiver (FREESTYLE LIBRE 2 READER) DEVI Use as directed to monitor blood glucose continuously. DX E11.65 1 each 1   Continuous Blood Gluc Sensor (FREESTYLE LIBRE 2 SENSOR) MISC Use as directed to monitor blood glucose continuously. Change sensor Q 14 days. DX E11.65 2 each 11   Dulaglutide (TRULICITY) 0.07 HQ/1.9XJ SOPN Inject 0.75 mg into the skin once a week. 2 mL 3   glucose blood test strip 1 each by Other route as needed for other. Use as instructed 100 each 11   hydrALAZINE (APRESOLINE) 50 MG tablet TAKE 1 TABLET BY MOUTH THREE TIMES A DAY 270 tablet 1   Insulin Pen Needle (B-D UF III MINI PEN NEEDLES) 31G X 5 MM MISC USE DAILY WITH INSULIN 100 each 5   Insulin Syringe-Needle U-100 (INSULIN SYRINGE .3CC/31GX5/16") 31G X 5/16" 0.3 ML MISC Use twice daily to inject insulin. 100 each 10   losartan (COZAAR) 100 MG tablet Take 1 tablet (100 mg total) by mouth daily. 90 tablet 1   magnesium oxide (MAG-OX) 400 MG tablet Take 400 mg by mouth daily.     OneTouch Delica Lancets 88T MISC 1 each by Does not apply route 2 (two) times daily. 200 each 11   pioglitazone (ACTOS) 15 MG tablet TAKE 1 TABLET BY MOUTH EVERY DAY 90 tablet 2   warfarin (COUMADIN) 10 MG tablet Take 1 tablet (10 mg total) by mouth daily. 90 tablet 3   No current facility-administered medications for this visit.     PHYSICAL EXAMINATION: ECOG PERFORMANCE STATUS: 0 - Asymptomatic  Vitals:   03/20/21 0928   BP: (!) 156/84  Pulse: 77  Resp: 16  Temp: 97.7 F (36.5 C)  SpO2: 100%    Filed Weights   03/20/21 0928  Weight: 192 lb 9.6 oz (87.4 kg)      Physical Exam Constitutional:  Appearance: Normal appearance.  HENT:     Head: Normocephalic and atraumatic.     Nose: Nose normal.     Mouth/Throat:     Mouth: Mucous membranes are moist.  Cardiovascular:     Rate and Rhythm: Normal rate and regular rhythm.     Pulses: Normal pulses.     Heart sounds: Normal heart sounds.  Pulmonary:     Effort: Pulmonary effort is normal.     Breath sounds: Normal breath sounds.  Abdominal:     General: Abdomen is flat.     Palpations: Abdomen is soft.  Musculoskeletal:        General: No swelling. Normal range of motion.     Cervical back: Normal range of motion and neck supple.  Lymphadenopathy:     Cervical: No cervical adenopathy (lymphedema noted. No palpable adenopathy).  Skin:    General: Skin is warm and dry.  Neurological:     General: No focal deficit present.     Mental Status: He is alert and oriented to person, place, and time.  Psychiatric:        Mood and Affect: Mood normal.        Behavior: Behavior normal.     LABORATORY DATA:  I have reviewed the data as listed Lab Results  Component Value Date   WBC 5.3 03/20/2021   HGB 8.8 (L) 03/20/2021   HCT 27.8 (L) 03/20/2021   MCV 92.4 03/20/2021   PLT 187 03/20/2021     Chemistry      Component Value Date/Time   NA 138 03/20/2021 0909   K 4.8 03/20/2021 0909   CL 100 03/20/2021 0909   CO2 30 03/20/2021 0909   BUN 41 (H) 03/20/2021 0909   CREATININE 2.10 (H) 03/20/2021 0909   CREATININE 2.27 (H) 02/28/2021 0831      Component Value Date/Time   CALCIUM 9.5 03/20/2021 0909   ALKPHOS 54 03/20/2021 0909   AST 14 (L) 03/20/2021 0909   ALT 13 03/20/2021 0909   BILITOT 0.3 03/20/2021 0909       RADIOGRAPHIC STUDIES: I have personally reviewed the radiological images as listed and agreed with the findings  in the report. ECHOCARDIOGRAM COMPLETE  Result Date: 03/07/2021    ECHOCARDIOGRAM REPORT   Patient Name:   Brian [name] Date of Exam: 03/07/2021 Medical Rec #:  [id number]         Height:       69.0 in Accession #:    [id number]        Weight:       182.0 lb Date of Birth:  [birth year]          BSA:          1.985 m Patient Age:    69 years          BP:           102/65 mmHg Patient Gender: M                 HR:           64 bpm. Exam Location:  [facility] Procedure: 2D Echo, 3D Echo, Cardiac Doppler and Color Doppler Indications:    I42.9 Cardiomyopathy-unspecified  History:        Patient has prior history of Echocardiogram examinations, most                 recent 12/12/2009. Arrythmias:Atrial Fibrillation,  Signs/Symptoms:Syncope; Risk Factors:Diabetes and Hypertension.  Sonographer:    Marygrace Drought RCS Referring Phys: [place]  1. Left ventricular ejection fraction, by estimation, is 55 to 60%. The left ventricle has normal function. The left ventricle has no regional wall motion abnormalities. There is mild concentric left ventricular hypertrophy. Left ventricular diastolic parameters are consistent with Grade II diastolic dysfunction (pseudonormalization). Elevated left atrial pressure.  2. Right ventricular systolic function is normal. The right ventricular size is normal. Tricuspid regurgitation signal is inadequate for assessing PA pressure.  3. Left atrial size was mildly dilated.  4. The mitral valve is normal in structure. Trivial mitral valve regurgitation.  5. The aortic valve is tricuspid. There is mild calcification of the aortic valve. There is mild thickening of the aortic valve. Aortic valve regurgitation is not visualized. Aortic valve sclerosis/calcification is present, without any evidence of aortic stenosis.  6. The inferior vena cava is normal in size with greater than 50% respiratory variability, suggesting right atrial pressure of 3 mmHg.  Comparison(s): Prior images unable to be directly viewed, comparison made by report only. FINDINGS  Left Ventricle: Left ventricular ejection fraction, by estimation, is 55 to 60%. The left ventricle has normal function. The left ventricle has no regional wall motion abnormalities. 3D left ventricular ejection fraction analysis performed but not reported based on interpreter judgement due to suboptimal tracking. The left ventricular internal cavity size was normal in size. There is mild concentric left ventricular hypertrophy. Left ventricular diastolic parameters are consistent with Grade II diastolic dysfunction (pseudonormalization). Elevated left atrial pressure. Right Ventricle: The right ventricular size is normal. No increase in right ventricular wall thickness. Right ventricular systolic function is normal. Tricuspid regurgitation signal is inadequate for assessing PA pressure. Left Atrium: Left atrial size was mildly dilated. Right Atrium: Right atrial size was normal in size. Pericardium: There is no evidence of pericardial effusion. Mitral Valve: The mitral valve is normal in structure. Trivial mitral valve regurgitation. Tricuspid Valve: The tricuspid valve is normal in structure. Tricuspid valve regurgitation is not demonstrated. Aortic Valve: The aortic valve is tricuspid. There is mild calcification of the aortic valve. There is mild thickening of the aortic valve. Aortic valve regurgitation is not visualized. Aortic valve sclerosis/calcification is present, without any evidence of aortic stenosis. Pulmonic Valve: The pulmonic valve was grossly normal. Pulmonic valve regurgitation is not visualized. Aorta: The aortic root and ascending aorta are structurally normal, with no evidence of dilitation. Venous: The inferior vena cava is normal in size with greater than 50% respiratory variability, suggesting right atrial pressure of 3 mmHg. IAS/Shunts: No atrial level shunt detected by color flow Doppler.   LEFT VENTRICLE PLAX 2D LVIDd:         3.90 cm   Diastology LVIDs:         2.40 cm   LV e' medial:    4.00 cm/s LV PW:         1.30 cm   LV E/e' medial:  21.1 LV IVS:        1.30 cm   LV e' lateral:   6.53 cm/s LVOT diam:     2.00 cm   LV E/e' lateral: 12.9 LV SV:         73 LV SV Index:   37 LVOT Area:     3.14 cm                           3D  Volume EF:                          3D EF:        52 %                          LV EDV:       84 ml                          LV ESV:       41 ml                          LV SV:        44 ml RIGHT VENTRICLE RV Basal diam:  3.00 cm RV S prime:     12.30 cm/s TAPSE (M-mode): 2.0 cm LEFT ATRIUM             Index        RIGHT ATRIUM           Index LA diam:        4.30 cm 2.17 cm/m   RA Area:     15.30 cm LA Vol (A2C):   50.1 ml 25.24 ml/m  RA Volume:   40.50 ml  20.40 ml/m LA Vol (A4C):   44.9 ml 22.62 ml/m LA Biplane Vol: 50.2 ml 25.29 ml/m  AORTIC VALVE LVOT Vmax:   97.40 cm/s LVOT Vmean:  77.900 cm/s LVOT VTI:    0.231 m  AORTA Ao Root diam: 3.20 cm Ao Asc diam:  3.80 cm MITRAL VALVE MV Area (PHT):             SHUNTS MV Decel Time:             Systemic VTI:  0.23 m MV E velocity: 84.40 cm/s  Systemic Diam: 2.00 cm MV A velocity: 81.80 cm/s MV E/A ratio:  1.03 Mihai Croitoru MD Electronically signed by Sanda Klein MD Signature Date/Time: 03/07/2021/3:12:28 PM    Final      All questions were answered. The patient knows to call the clinic with any problems, questions or concerns.. I spent 20 minutes in the care of this patient including H and P, review of records, counseling and coordination of care.    [name], MD 03/20/2021 9:53 AM

## 2021-03-23 ENCOUNTER — Telehealth: Payer: Self-pay | Admitting: Pharmacist
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
## 2021-03-27 ENCOUNTER — Encounter: Payer: Self-pay | Admitting: Family Medicine

## 2021-03-27 ENCOUNTER — Ambulatory Visit (INDEPENDENT_AMBULATORY_CARE_PROVIDER_SITE_OTHER): Payer: Medicare Other | Admitting: Family Medicine

## 2021-03-27 ENCOUNTER — Other Ambulatory Visit: Payer: Self-pay

## 2021-03-27 DIAGNOSIS — I4891 Unspecified atrial fibrillation: Secondary | ICD-10-CM | POA: Diagnosis not present

## 2021-03-27 LAB — PT WITH INR/FINGERSTICK
INR, fingerstick: 1.5 ratio — ABNORMAL HIGH
PT, fingerstick: 18.2 s — ABNORMAL HIGH (ref 10.5–13.1)

## 2021-03-27 NOTE — Progress Notes (Signed)
Subjective:    Patient ID: Brian [name], male    DOB: 11-Mar-1955, 66 y.o.   MRN: [id number]  Patient is a very pleasant 66 year old Caucasian gentleman who presents today to recheck his INR.  He is supposed to be on 5 mg of Coumadin every day and taking 10 mg on Monday Wednesday and Friday.  However he did not change his dose after his last Coumadin check.  He is fairly certain that he is taking 5 mg of Coumadin every day.  His INR is subtherapeutic today at 1.5.  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 Zigmund Daniel, laser therapy OD   Seizures ([place])    pt reports this is from low blood sugar    Past Surgical History:  Procedure Laterality Date   COLONOSCOPY     DIRECT LARYNGOSCOPY N/A 02/10/2020   Procedure: DIRECT LARYNGOSCOPY WITH BIOPSY;  Surgeon: [name], MD;  Location: [place];  Service: ENT;  Laterality: N/A;   Ear surgery as a child     IR GASTROSTOMY TUBE MOD SED  03/23/2020   IR GASTROSTOMY TUBE REMOVAL  06/01/2020   IR IMAGING GUIDED PORT INSERTION  03/09/2020   IR REMOVAL TUN ACCESS W/ PORT W/O FL MOD SED  02/08/2021   TONSILLECTOMY     Current Outpatient Medications on File Prior to Visit  Medication Sig Dispense Refill   atorvastatin (LIPITOR) 20 MG tablet TAKE 1 TABLET (20 MG TOTAL) DAILY BY MOUTH. 90 tablet 3   Blood Glucose Monitoring Suppl (ONETOUCH VERIO IQ SYSTEM) w/Device KIT 1 each by Does not apply route 2 (two) times daily. 1 kit 0   carvedilol (COREG) 25 MG tablet Take 1 tablet (25 mg total) by mouth 2 (two) times daily. 180 tablet 3   Continuous Blood Gluc Receiver (FREESTYLE LIBRE 2 READER) DEVI Use as directed to monitor blood glucose continuously. DX E11.65 1 each 1    Continuous Blood Gluc Sensor (FREESTYLE LIBRE 2 SENSOR) MISC Use as directed to monitor blood glucose continuously. Change sensor Q 14 days. DX E11.65 2 each 11   Dulaglutide (TRULICITY) 6.96 EX/5.2WU SOPN Inject 0.75 mg into the skin once a week. 2 mL 3   glucose blood test strip 1 each by Other route as needed for other. Use as instructed 100 each 11   hydrALAZINE (APRESOLINE) 50 MG tablet TAKE 1 TABLET BY MOUTH THREE TIMES A DAY 270 tablet 1   Insulin Pen Needle (B-D UF III MINI PEN NEEDLES) 31G X 5 MM MISC USE DAILY WITH INSULIN 100 each 5   Insulin Syringe-Needle U-100 (INSULIN SYRINGE .3CC/31GX5/16") 31G X 5/16" 0.3 ML MISC Use twice daily to inject insulin. 100 each 10   losartan (COZAAR) 100 MG tablet Take 1 tablet (100 mg total) by mouth daily. 90 tablet 1   magnesium oxide (MAG-OX) 400 MG tablet Take 400 mg by mouth daily.     OneTouch Delica Lancets 13K MISC 1 each by Does not apply route 2 (two) times daily. 200 each 11   pioglitazone (ACTOS) 15 MG tablet TAKE 1 TABLET BY MOUTH EVERY DAY 90 tablet 2  warfarin (COUMADIN) 10 MG tablet Take 1 tablet (10 mg total) by mouth daily. 90 tablet 3   No current facility-administered medications on file prior to visit.     Allergies  Allergen Reactions   Codeine Other (See Comments)    Unknown reaction, severe headach   Metformin And Related Diarrhea   Social History   Socioeconomic History   Marital status: Divorced    Spouse name: Not on file   Number of children: 2   Years of education: Not on file   Highest education level: Not on file  Occupational History   Occupation: Disabled  Tobacco Use   Smoking status: Never   Smokeless tobacco: Never  Vaping Use   Vaping Use: Never used  Substance and Sexual Activity   Alcohol use: Yes    Alcohol/week: 0.0 standard drinks    Comment: seldom   Drug use: Yes    Frequency: 7.0 times per week    Comment: marijauna for arthritis, last 03-13-20   Sexual activity: Not Currently   Other Topics Concern   Not on file  Social History Narrative   Two children.  5 grandchildren.     Social Determinants of Health   Financial Resource Strain: Not on file  Food Insecurity: Not on file  Transportation Needs: Not on file  Physical Activity: Not on file  Stress: Not on file  Social Connections: Not on file  Intimate Partner Violence: Not on file    Review of Systems  All other systems reviewed and are negative.     Objective:   Physical Exam Vitals reviewed.  Cardiovascular:     Rate and Rhythm: Normal rate and regular rhythm.     Heart sounds: Normal heart sounds.  Pulmonary:     Effort: Pulmonary effort is normal. No respiratory distress.     Breath sounds: Normal breath sounds. No wheezing or rales.  Chest:     Chest wall: No tenderness.  Abdominal:     General: Bowel sounds are normal. There is no distension.     Palpations: Abdomen is soft.     Tenderness: There is no abdominal tenderness. There is no rebound.  Musculoskeletal:     Right lower leg: No edema.     Left lower leg: No edema.       Assessment & Plan:  Atrial fibrillation, unspecified type ([place]) - Plan: PT with INR/Fingerstick INR is subtherapeutic.  Increase Coumadin to 5 mg on Tuesday, Thursday, Saturday, Sunday and 10 mg on Monday, Wednesday, Friday and recheck INR in 2 weeks.

## 2021-03-29 ENCOUNTER — Encounter: Payer: Self-pay | Admitting: *Deleted

## 2021-03-30 ENCOUNTER — Other Ambulatory Visit: Payer: Self-pay

## 2021-03-30 ENCOUNTER — Ambulatory Visit (INDEPENDENT_AMBULATORY_CARE_PROVIDER_SITE_OTHER): Payer: Medicare Other

## 2021-03-30 VITALS — Ht 69.0 in | Wt 192.0 lb

## 2021-03-30 DIAGNOSIS — Z0000 Encounter for general adult medical examination without abnormal findings: Secondary | ICD-10-CM

## 2021-03-30 NOTE — Progress Notes (Signed)
Subjective:   Brian [name] is a 66 y.o. male who presents for an Initial Medicare Annual Wellness Visit. Virtual Visit via Telephone Note  I connected with  Brian [name] on 03/30/21 at  9:00 AM EST by telephone and verified that I am speaking with the correct person using two identifiers.  Location: Patient: HOME  Provider: [facility] Persons participating in the virtual visit: patient/Nurse Health Advisor   I discussed the limitations, risks, security and privacy concerns of performing an evaluation and management service by telephone and the availability of in person appointments. The patient expressed understanding and agreed to proceed.  Interactive audio and video telecommunications were attempted between this nurse and patient, however failed, due to patient having technical difficulties OR patient did not have access to video capability.  We continued and completed visit with audio only.  Some vital signs may be absent or patient reported.   Chriss Driver, LPN  Review of Systems     Cardiac Risk Factors include: advanced age (>78mn, >>41women);diabetes mellitus;hypertension;dyslipidemia;male gender  PT AT HOME. NURSE AT [facility]. PHONE VISIT.    Objective:    Today's Vitals   03/30/21 0900  Weight: 192 lb (87.1 kg)  Height: 5' 9"  (1.753 m)   Body mass index is 28.35 kg/m.  Advanced Directives 03/30/2021 01/25/2021 12/29/2020 09/16/2020 05/20/2020 04/25/2020 04/18/2020  Does Patient Have a Medical Advance Directive? No No No No No No No  Would patient like information on creating a medical advance directive? No - Patient declined Yes (MAU/Ambulatory/Procedural Areas - Information given) No - Patient declined No - Patient declined No - Patient declined No - Patient declined No - Patient declined  Pre-existing out of facility DNR order (yellow form or pink MOST form) - - - - - - -    Current Medications (verified) Outpatient Encounter Medications as of 03/30/2021   Medication Sig   atorvastatin (LIPITOR) 20 MG tablet TAKE 1 TABLET (20 MG TOTAL) DAILY BY MOUTH.   Blood Glucose Monitoring Suppl (ONETOUCH VERIO IQ SYSTEM) w/Device KIT 1 each by Does not apply route 2 (two) times daily.   carvedilol (COREG) 25 MG tablet Take 1 tablet (25 mg total) by mouth 2 (two) times daily.   Continuous Blood Gluc Receiver (FREESTYLE LIBRE 2 READER) DEVI Use as directed to monitor blood glucose continuously. DX E11.65   Continuous Blood Gluc Sensor (FREESTYLE LIBRE 2 SENSOR) MISC Use as directed to monitor blood glucose continuously. Change sensor Q 14 days. DX E11.65   Dulaglutide (TRULICITY) 01.61MWR/6.0AVSOPN Inject 0.75 mg into the skin once a week.   glucose blood test strip 1 each by Other route as needed for other. Use as instructed   hydrALAZINE (APRESOLINE) 50 MG tablet TAKE 1 TABLET BY MOUTH THREE TIMES A DAY   Insulin Pen Needle (B-D UF III MINI PEN NEEDLES) 31G X 5 MM MISC USE DAILY WITH INSULIN   Insulin Syringe-Needle U-100 (INSULIN SYRINGE .3CC/31GX5/16") 31G X 5/16" 0.3 ML MISC Use twice daily to inject insulin.   losartan (COZAAR) 100 MG tablet Take 1 tablet (100 mg total) by mouth daily.   magnesium oxide (MAG-OX) 400 MG tablet Take 400 mg by mouth daily.   OneTouch Delica Lancets 340JMISC 1 each by Does not apply route 2 (two) times daily.   pioglitazone (ACTOS) 15 MG tablet TAKE 1 TABLET BY MOUTH EVERY DAY   warfarin (COUMADIN) 10 MG tablet Take 1 tablet (10 mg total) by mouth daily.  No facility-administered encounter medications on file as of 03/30/2021.    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)    Dr.  Zigmund Daniel, laser therapy OD   Seizures ([place])    pt reports this is from low blood sugar   Past Surgical History:  Procedure Laterality Date   COLONOSCOPY     DIRECT LARYNGOSCOPY N/A 02/10/2020   Procedure: DIRECT LARYNGOSCOPY WITH BIOPSY;  Surgeon: [name], MD;  Location: [facility];  Service: ENT;  Laterality: N/A;   Ear surgery as a child     IR GASTROSTOMY TUBE MOD SED  03/23/2020   IR GASTROSTOMY TUBE REMOVAL  06/01/2020   IR IMAGING GUIDED PORT INSERTION  03/09/2020   IR REMOVAL TUN ACCESS W/ PORT W/O FL MOD SED  02/08/2021   TONSILLECTOMY     Family History  Problem Relation Age of Onset   Colon cancer Mother    Hyperlipidemia Father    Hypertension Father    Breast cancer Sister    Coronary artery disease Neg Hx        no early CAD.   Stomach cancer Neg Hx    Rectal cancer Neg Hx    Esophageal cancer Neg Hx    Social History   Socioeconomic History   Marital status: Divorced    Spouse name: Not on file   Number of children: 2   Years of education: Not on file   Highest education level: Not on file  Occupational History   Occupation: Disabled  Tobacco Use   Smoking status: Never   Smokeless tobacco: Never  Vaping Use   Vaping Use: Never used  Substance and Sexual Activity   Alcohol use: Yes    Alcohol/week: 0.0 standard drinks    Comment: seldom   Drug use: Yes    Frequency: 7.0 times per week    Comment: marijauna for arthritis, last 03-13-20   Sexual activity: Not Currently  Other Topics Concern   Not on file  Social History Narrative   Two children.  5 grandchildren.     Social Determinants of Health   Financial Resource Strain: Low Risk    Difficulty of Paying Living Expenses: Not hard at all  Food Insecurity: No Food Insecurity   Worried About Charity fundraiser in the Last Year: Never true   [place] in the Last Year: Never true  Transportation Needs: No Transportation Needs   Lack of Transportation (Medical): No   Lack of Transportation  (Non-Medical): No  Physical Activity: Sufficiently Active   Days of Exercise per Week: 5 days   Minutes of Exercise per Session: 30 min  Stress: No Stress Concern Present   Feeling of Stress : Not at all  Social Connections: Socially Isolated   Frequency of Communication with Friends and Family: More than three times a week   Frequency of Social Gatherings with Friends and Family: More than three times a week   Attends Religious Services: Never   Marine scientist or Organizations: No   Attends Archivist Meetings: Never  Marital Status: Divorced    Tobacco Counseling Counseling given: Not Answered   Clinical Intake:  Pre-visit preparation completed: Yes  Pain : No/denies pain     BMI - recorded: 28.35 Nutritional Status: BMI 25 -29 Overweight Nutritional Risks: None Diabetes: Yes  How often do you need to have someone help you when you read instructions, pamphlets, or other written materials from your doctor or pharmacy?: 1 - Never  Diabetic?Nutrition Risk Assessment:  Has the patient had any N/V/D within the last 2 months?  No  Does the patient have any non-healing wounds?  No  Has the patient had any unintentional weight loss or weight gain?  No   Diabetes:  Is the patient diabetic?  Yes  If diabetic, was a CBG obtained today?  No  Did the patient bring in their glucometer from home?  No  Phone visit. How often do you monitor your CBG's? Checks A1c at office.   Financial Strains and Diabetes Management:  Are you having any financial strains with the device, your supplies or your medication? Yes .  Does the patient want to be seen by Chronic Care Management for management of their diabetes?  No  Would the patient like to be referred to a Nutritionist or for Diabetic Management?  No   Diabetic Exams:  Diabetic Eye Exam: Completed 2021. Overdue for diabetic eye exam. Pt has been advised about the importance in completing this exam.  Diabetic  Foot Exam: Completed 02/17/2019. Pt has been advised about the importance in completing this exam.  Interpreter Needed?: No  Information entered by :: mj Sanaa Zilberman, lpn   Activities of Daily Living In your present state of health, do you have any difficulty performing the following activities: 03/30/2021  Hearing? Y  Vision? N  Difficulty concentrating or making decisions? N  Walking or climbing stairs? N  Dressing or bathing? N  Doing errands, shopping? N  Preparing Food and eating ? N  Using the Toilet? N  In the past six months, have you accidently leaked urine? N  Do you have problems with loss of bowel control? N  Managing your Medications? N  Managing your Finances? N  Housekeeping or managing your Housekeeping? N  Some recent data might be hidden    Patient Care Team: [name], MD as PCP - General (Family Medicine) Minus Breeding, MD as PCP - Cardiology (Cardiology) [name], [name], RN as Oncology Nurse Navigator [name], MD as Consulting Physician (Radiation Oncology) [name], MD as Consulting Physician (Hematology and Oncology) [name], RD as Dietitian (Nutrition) Valentino Saxon [name], [name] as Speech Language Pathologist (Speech Pathology) [name], [name], PT as Physical Therapist (Physical Therapy) [name], [name] (Inactive) as Social Worker (General Practice) [name], MD as Consulting Physician (Otolaryngology) [name], [facility] as Pharmacist (Pharmacist) Minus Breeding, MD as Consulting Physician (Cardiology)  Indicate any recent Medical Services you may have received from other than [facility] providers in the past year (date may be approximate).     Assessment:   This is a routine wellness examination for Brian [name].  Hearing/Vision screen Hearing Screening - Comments:: Some hearing issues.  Vision Screening - Comments:: No glasses. 2021. [place]  Dietary issues and exercise activities  discussed: Current Exercise Habits: Home exercise routine, Type of exercise: walking, Time (Minutes): 30, Frequency (Times/Week): 5, Weekly Exercise (Minutes/Week): 150, Intensity: Mild, Exercise limited by: cardiac condition(s)   Goals Addressed  This Visit's Progress    Have 3 meals a day       Eat a healthy diet and watch carb intake.       Depression Screen PHQ 2/9 Scores 03/30/2021 04/21/2019 04/26/2017 03/15/2017 11/06/2016 09/25/2016 05/14/2016  PHQ - 2 Score 0 0 0 0 0 0 0  PHQ- 9 Score - - - - 1 2 0  Exception Documentation - - - - Patient refusal - -    Fall Risk Fall Risk  03/30/2021 03/22/2020 04/21/2019 03/15/2017 11/06/2016  Falls in the past year? 1 0 0 No No  Number falls in past yr: 1 0 - - -  Injury with Fall? 0 0 - - -  Risk for fall due to : Impaired balance/gait - - - -  Follow up Falls prevention discussed - Falls evaluation completed - -    FALL RISK PREVENTION PERTAINING TO THE HOME:  Any stairs in or around the home? No  If so, are there any without handrails? No  Home free of loose throw rugs in walkways, pet beds, electrical cords, etc? Yes  Adequate lighting in your home to reduce risk of falls? Yes   ASSISTIVE DEVICES UTILIZED TO PREVENT FALLS:  Life alert? No  Use of a cane, walker or w/c? No  Grab bars in the bathroom? No  Shower chair or bench in shower? Yes  Elevated toilet seat or a handicapped toilet? Yes   TIMED UP AND GO:  Was the test performed? No .  Phone visit.  Cognitive Function:     6CIT Screen 03/30/2021  What Year? 0 points  What month? 3 points  What time? 0 points  Count back from 20 0 points  Months in reverse 4 points  Repeat phrase 2 points  Total Score 9    Immunizations Immunization History  Administered Date(s) Administered   Fluad Quad(high Dose 65+) 01/05/2019   Influenza Whole 12/11/2009   Influenza,inj,Quad PF,6+ Mos 11/22/2015, 11/06/2016, 10/29/2017, 01/31/2021   PFIZER(Purple Top)SARS-COV-2  Vaccination 03/02/2020, 03/24/2020   Pneumococcal Conjugate-13 01/29/2013   Pneumococcal Polysaccharide-23 12/11/2009    TDAP status: Due, Education has been provided regarding the importance of this vaccine. Advised may receive this vaccine at local pharmacy or Health Dept. Aware to provide a copy of the vaccination record if obtained from local pharmacy or Health Dept. Verbalized acceptance and understanding.  Flu Vaccine status: Up to date  Pneumococcal vaccine status: Up to date  Covid-19 vaccine status: Completed vaccines  Qualifies for Shingles Vaccine? Yes   Zostavax completed Yes   Shingrix Completed?: No.    Education has been provided regarding the importance of this vaccine. Patient has been advised to call insurance company to determine out of pocket expense if they have not yet received this vaccine. Advised may also receive vaccine at local pharmacy or Health Dept. Verbalized acceptance and understanding.  Screening Tests Health Maintenance  Topic Date Due   TETANUS/TDAP  Never done   Zoster Vaccines- Shingrix (1 of 2) Never done   Pneumonia Vaccine 37+ Years old (3) 07/24/2018   FOOT EXAM  02/17/2020   COVID-19 Vaccine (3 - Pfizer risk series) 04/21/2020   LIPID PANEL  11/29/2020   HEMOGLOBIN A1C  05/01/2021   OPHTHALMOLOGY EXAM  06/21/2021   COLONOSCOPY (Pts 45-73yr Insurance coverage will need to be confirmed)  08/12/2023   INFLUENZA VACCINE  Completed   Hepatitis C Screening  Completed   HPV VACCINES  Aged Out    Health Maintenance  Health Maintenance Due  Topic Date Due   TETANUS/TDAP  Never done   Zoster Vaccines- Shingrix (1 of 2) Never done   Pneumonia Vaccine 60+ Years old (3) 07/24/2018   FOOT EXAM  02/17/2020   COVID-19 Vaccine (3 - Pfizer risk series) 04/21/2020   LIPID PANEL  11/29/2020    Colorectal cancer screening: Type of screening: Colonoscopy. Completed 08/12/2018. Repeat every 5 years  Lung Cancer Screening: (Low Dose CT Chest  recommended if Age 8-80 years, 30 pack-year currently smoking OR have quit w/in 15years.) does not qualify.    Additional Screening:  Hepatitis C Screening: does qualify; Completed 10/11/2015  Vision Screening: Recommended annual ophthalmology exams for early detection of glaucoma and other disorders of the eye. Is the patient up to date with their annual eye exam?  No  Who is the provider or what is the name of the office in which the patient attends annual eye exams? [facility] If pt is not established with a provider, would they like to be referred to a provider to establish care? No .   Dental Screening: Recommended annual dental exams for proper oral hygiene  Community Resource Referral / Chronic Care Management: CRR required this visit?  No   CCM required this visit?  No      Plan:     I have personally reviewed and noted the following in the patients chart:   Medical and social history Use of alcohol, tobacco or illicit drugs  Current medications and supplements including opioid prescriptions. Patient is not currently taking opioid prescriptions. Functional ability and status Nutritional status Physical activity Advanced directives List of other physicians Hospitalizations, surgeries, and ER visits in previous 12 months Vitals Screenings to include cognitive, depression, and falls Referrals and appointments  In addition, I have reviewed and discussed with patient certain preventive protocols, quality metrics, and best practice recommendations. A written personalized care plan for preventive services as well as general preventive health recommendations were provided to patient.     Chriss Driver, LPN   02/24/1094   Nurse Notes: Discussed Optometry appointment, patient states he will make appointment. Currently undergoing cancer treatments for throat cancer. Colonoscopy due 07/2021, pt advised and verbalized understanding. 6CIT score of 9.

## 2021-03-30 NOTE — Patient Instructions (Signed)
Brian [name] , Thank you for taking time to come for your Medicare Wellness Visit. I appreciate your ongoing commitment to your health goals. Please review the following plan we discussed and let me know if I can assist you in the future.   Screening recommendations/referrals: Colonoscopy: Done 08/12/2018 Repeat in 5 years  Recommended yearly ophthalmology/optometry visit for glaucoma screening and checkup Recommended yearly dental visit for hygiene and checkup  Vaccinations: Influenza vaccine: Done 01/31/2021 Repeat annually  Pneumococcal vaccine: Done 01/29/2013 and 12/11/2009. Tdap vaccine: Due Repeat in 10 years  Shingles vaccine: Discussed.    Covid-19: Done 03/02/2020 and 03/24/2020.  Advanced directives: Advance directive discussed with you today. Even though you declined this today, please call our office should you change your mind, and we can give you the proper paperwork for you to fill out.   Conditions/risks identified: Aim for 30 minutes of exercise or brisk walking each day, drink 6-8 glasses of water and eat lots of fruits and vegetables. KEEP UP THE GOOD WORK!!  Next appointment: Follow up in one year for your annual wellness visit. 2024.  Preventive Care 22 Years and Older, Male  Preventive care refers to lifestyle choices and visits with your health care provider that can promote health and wellness. What does preventive care include? A yearly physical exam. This is also called an annual well check. Dental exams once or twice a year. Routine eye exams. Ask your health care provider how often you should have your eyes checked. Personal lifestyle choices, including: Daily care of your teeth and gums. Regular physical activity. Eating a healthy diet. Avoiding tobacco and drug use. Limiting alcohol use. Practicing safe sex. Taking low doses of aspirin every day. Taking vitamin and mineral supplements as recommended by your health care provider. What happens  during an annual well check? The services and screenings done by your health care provider during your annual well check will depend on your age, overall health, lifestyle risk factors, and family history of disease. Counseling  Your health care provider may ask you questions about your: Alcohol use. Tobacco use. Drug use. Emotional well-being. Home and relationship well-being. Sexual activity. Eating habits. History of falls. Memory and ability to understand (cognition). Work and work Statistician. Screening  You may have the following tests or measurements: Height, weight, and BMI. Blood pressure. Lipid and cholesterol levels. These may be checked every 5 years, or more frequently if you are over 72 years old. Skin check. Lung cancer screening. You may have this screening every year starting at age 53 if you have a 30-pack-year history of smoking and currently smoke or have quit within the past 15 years. Fecal occult blood test (FOBT) of the stool. You may have this test every year starting at age 63. Flexible sigmoidoscopy or colonoscopy. You may have a sigmoidoscopy every 5 years or a colonoscopy every 10 years starting at age 90. Prostate cancer screening. Recommendations will vary depending on your family history and other risks. Hepatitis C blood test. Hepatitis B blood test. Sexually transmitted disease (STD) testing. Diabetes screening. This is done by checking your blood sugar (glucose) after you have not eaten for a while (fasting). You may have this done every 1-3 years. Abdominal aortic aneurysm (AAA) screening. You may need this if you are a current or former smoker. Osteoporosis. You may be screened starting at age 21 if you are at high risk. Talk with your health care provider about your test results, treatment options, and if necessary, the  need for more tests. Vaccines  Your health care provider may recommend certain vaccines, such as: Influenza vaccine. This is  recommended every year. Tetanus, diphtheria, and acellular pertussis (Tdap, Td) vaccine. You may need a Td booster every 10 years. Zoster vaccine. You may need this after age 86. Pneumococcal 13-valent conjugate (PCV13) vaccine. One dose is recommended after age 39. Pneumococcal polysaccharide (PPSV23) vaccine. One dose is recommended after age 90. Talk to your health care provider about which screenings and vaccines you need and how often you need them. This information is not intended to replace advice given to you by your health care provider. Make sure you discuss any questions you have with your health care provider. Document Released: 03/04/2015 Document Revised: 10/26/2015 Document Reviewed: 12/07/2014 Elsevier Interactive Patient Education  2017 [place] Prevention in the Home Falls can cause injuries. They can happen to people of all ages. There are many things you can do to make your home safe and to help prevent falls. What can I do on the outside of my home? Regularly fix the edges of walkways and driveways and fix any cracks. Remove anything that might make you trip as you walk through a door, such as a raised step or threshold. Trim any bushes or trees on the path to your home. Use bright outdoor lighting. Clear any walking paths of anything that might make someone trip, such as rocks or tools. Regularly check to see if handrails are loose or broken. Make sure that both sides of any steps have handrails. Any raised decks and porches should have guardrails on the edges. Have any leaves, snow, or ice cleared regularly. Use sand or salt on walking paths during winter. Clean up any spills in your garage right away. This includes oil or grease spills. What can I do in the bathroom? Use night lights. Install grab bars by the toilet and in the tub and shower. Do not use towel bars as grab bars. Use non-skid mats or decals in the tub or shower. If you need to sit down in  the shower, use a plastic, non-slip stool. Keep the floor dry. Clean up any water that spills on the floor as soon as it happens. Remove soap buildup in the tub or shower regularly. Attach bath mats securely with double-sided non-slip rug tape. Do not have throw rugs and other things on the floor that can make you trip. What can I do in the bedroom? Use night lights. Make sure that you have a light by your bed that is easy to reach. Do not use any sheets or blankets that are too big for your bed. They should not hang down onto the floor. Have a firm chair that has side arms. You can use this for support while you get dressed. Do not have throw rugs and other things on the floor that can make you trip. What can I do in the kitchen? Clean up any spills right away. Avoid walking on wet floors. Keep items that you use a lot in easy-to-reach places. If you need to reach something above you, use a strong step stool that has a grab bar. Keep electrical cords out of the way. Do not use floor polish or wax that makes floors slippery. If you must use wax, use non-skid floor wax. Do not have throw rugs and other things on the floor that can make you trip. What can I do with my stairs? Do not leave any items on the  stairs. Make sure that there are handrails on both sides of the stairs and use them. Fix handrails that are broken or loose. Make sure that handrails are as long as the stairways. Check any carpeting to make sure that it is firmly attached to the stairs. Fix any carpet that is loose or worn. Avoid having throw rugs at the top or bottom of the stairs. If you do have throw rugs, attach them to the floor with carpet tape. Make sure that you have a light switch at the top of the stairs and the bottom of the stairs. If you do not have them, ask someone to add them for you. What else can I do to help prevent falls? Wear shoes that: Do not have high heels. Have rubber bottoms. Are comfortable  and fit you well. Are closed at the toe. Do not wear sandals. If you use a stepladder: Make sure that it is fully opened. Do not climb a closed stepladder. Make sure that both sides of the stepladder are locked into place. Ask someone to hold it for you, if possible. Clearly mark and make sure that you can see: Any grab bars or handrails. First and last steps. Where the edge of each step is. Use tools that help you move around (mobility aids) if they are needed. These include: Canes. Walkers. Scooters. Crutches. Turn on the lights when you go into a dark area. Replace any light bulbs as soon as they burn out. Set up your furniture so you have a clear path. Avoid moving your furniture around. If any of your floors are uneven, fix them. If there are any pets around you, be aware of where they are. Review your medicines with your doctor. Some medicines can make you feel dizzy. This can increase your chance of falling. Ask your doctor what other things that you can do to help prevent falls. This information is not intended to replace advice given to you by your health care provider. Make sure you discuss any questions you have with your health care provider. Document Released: 12/02/2008 Document Revised: 07/14/2015 Document Reviewed: 03/12/2014 Elsevier Interactive Patient Education  2017 [employer].

## 2021-04-10 ENCOUNTER — Other Ambulatory Visit: Payer: Self-pay

## 2021-04-10 ENCOUNTER — Ambulatory Visit (INDEPENDENT_AMBULATORY_CARE_PROVIDER_SITE_OTHER): Payer: Medicare Other | Admitting: Family Medicine

## 2021-04-10 VITALS — BP 138/82 | HR 75 | Temp 97.0°F | Resp 18 | Ht 69.0 in | Wt 187.0 lb

## 2021-04-10 DIAGNOSIS — I4891 Unspecified atrial fibrillation: Secondary | ICD-10-CM

## 2021-04-10 DIAGNOSIS — R0989 Other specified symptoms and signs involving the circulatory and respiratory systems: Secondary | ICD-10-CM | POA: Diagnosis not present

## 2021-04-10 LAB — PT WITH INR/FINGERSTICK
INR, fingerstick: 2.8 ratio — ABNORMAL HIGH
PT, fingerstick: 33 s — ABNORMAL HIGH (ref 10.5–13.1)

## 2021-04-10 NOTE — Progress Notes (Signed)
Subjective:    Patient ID: Brian [name], male    DOB: [birth year], 66 y.o.   MRN: [id number]  03/27/21 Patient is a very pleasant 66 year old Caucasian gentleman who presents today to recheck his INR.  He is supposed to be on 5 mg of Coumadin every day and taking 10 mg on Monday Wednesday and Friday.  However he did not change his dose after his last Coumadin check.  He is fairly certain that he is taking 5 mg of Coumadin every day.  His INR is subtherapeutic today at 1.5.  At that time, my plan was:  INR is subtherapeutic.  Increase Coumadin to 5 mg on Tuesday, Thursday, Saturday, Sunday and 10 mg on Monday, Wednesday, Friday and recheck INR in 2 weeks.  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  (This was stable).  INR today is therapeutic at 2.8.  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  He underwent radiation therapy.  PET scan in November 2022, showed no recurrence of head neck cancer.  The patient states however he is having a difficult time with food feeling like it is getting stuck in his upper throat.  He points to the area around the level of the cricoid cartilage.  He denies any trouble with food sticking in his chest in his esophagus.  He reports difficulty swallowing in his upper throat.  He has not seen his ENT doctor in quite some time for direct laryngoscopy.  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 Zigmund Daniel, laser therapy OD   Seizures ([place])    pt reports this is from low blood sugar    Past Surgical History:  Procedure Laterality Date   COLONOSCOPY     DIRECT  LARYNGOSCOPY N/A 02/10/2020   Procedure: DIRECT LARYNGOSCOPY WITH BIOPSY;  Surgeon: [name], MD;  Location: [facility];  Service: ENT;  Laterality: N/A;   Ear surgery as a child     IR GASTROSTOMY TUBE MOD SED  03/23/2020   IR GASTROSTOMY TUBE REMOVAL  06/01/2020   IR IMAGING GUIDED PORT INSERTION  03/09/2020   IR REMOVAL TUN ACCESS W/ PORT W/O FL MOD SED  02/08/2021   TONSILLECTOMY     Current Outpatient Medications on File Prior to Visit  Medication Sig Dispense Refill   atorvastatin (LIPITOR) 20 MG tablet TAKE 1 TABLET (20 MG TOTAL) DAILY BY MOUTH. 90 tablet 3   Blood Glucose Monitoring Suppl (ONETOUCH VERIO IQ SYSTEM) w/Device KIT 1 each by Does not apply route 2 (two) times daily. 1 kit 0   carvedilol (COREG) 25 MG tablet Take 1 tablet (25 mg total) by mouth 2 (two) times daily. 180 tablet 3   Continuous Blood Gluc Receiver (FREESTYLE LIBRE 2 READER) DEVI Use as directed to monitor blood glucose continuously. DX E11.65 1 each 1   Continuous Blood Gluc Sensor (FREESTYLE LIBRE 2 SENSOR) MISC Use as directed to monitor blood glucose continuously. Change sensor Q  14 days. DX E11.65 2 each 11   Dulaglutide (TRULICITY) 5.95 GL/8.7FI SOPN Inject 0.75 mg into the skin once a week. 2 mL 3   glucose blood test strip 1 each by Other route as needed for other. Use as instructed 100 each 11   hydrALAZINE (APRESOLINE) 50 MG tablet TAKE 1 TABLET BY MOUTH THREE TIMES A DAY 270 tablet 1   Insulin Pen Needle (B-D UF III MINI PEN NEEDLES) 31G X 5 MM MISC USE DAILY WITH INSULIN 100 each 5   Insulin Syringe-Needle U-100 (INSULIN SYRINGE .3CC/31GX5/16") 31G X 5/16" 0.3 ML MISC Use twice daily to inject insulin. 100 each 10   losartan (COZAAR) 100 MG tablet Take 1 tablet (100 mg total) by mouth daily. 90 tablet 1   magnesium oxide (MAG-OX) 400 MG tablet Take 400 mg by mouth daily.     OneTouch Delica Lancets 43P MISC 1 each by Does not apply route 2 (two) times daily. 200 each 11   pioglitazone (ACTOS) 15 MG  tablet TAKE 1 TABLET BY MOUTH EVERY DAY 90 tablet 2   warfarin (COUMADIN) 10 MG tablet Take 1 tablet (10 mg total) by mouth daily. 90 tablet 3   No current facility-administered medications on file prior to visit.     Allergies  Allergen Reactions   Codeine Other (See Comments)    Unknown reaction, severe headach   Metformin And Related Diarrhea   Social History   Socioeconomic History   Marital status: Divorced    Spouse name: Not on file   Number of children: 2   Years of education: Not on file   Highest education level: Not on file  Occupational History   Occupation: Disabled  Tobacco Use   Smoking status: Never   Smokeless tobacco: Never  Vaping Use   Vaping Use: Never used  Substance and Sexual Activity   Alcohol use: Yes    Alcohol/week: 0.0 standard drinks    Comment: seldom   Drug use: Yes    Frequency: 7.0 times per week    Comment: marijauna for arthritis, last 03-13-20   Sexual activity: Not Currently  Other Topics Concern   Not on file  Social History Narrative   Two children.  5 grandchildren.     Social Determinants of Health   Financial Resource Strain: Low Risk    Difficulty of Paying Living Expenses: Not hard at all  Food Insecurity: No Food Insecurity   Worried About Charity fundraiser in the Last Year: Never true   [place] in the Last Year: Never true  Transportation Needs: No Transportation Needs   Lack of Transportation (Medical): No   Lack of Transportation (Non-Medical): No  Physical Activity: Sufficiently Active   Days of Exercise per Week: 5 days   Minutes of Exercise per Session: 30 min  Stress: No Stress Concern Present   Feeling of Stress : Not at all  Social Connections: Socially Isolated   Frequency of Communication with Friends and Family: More than three times a week   Frequency of Social Gatherings with Friends and Family: More than three times a week   Attends Religious Services: Never   Marine scientist or  Organizations: No   Attends Archivist Meetings: Never   Marital Status: Divorced  Human resources officer Violence: Not At Risk   Fear of Current or Ex-Partner: No   Emotionally Abused: No   Physically Abused: No   Sexually Abused: No  Review of Systems  All other systems reviewed and are negative.     Objective:   Physical Exam Vitals reviewed.  Cardiovascular:     Rate and Rhythm: Normal rate and regular rhythm.     Heart sounds: Normal heart sounds.  Pulmonary:     Effort: Pulmonary effort is normal. No respiratory distress.     Breath sounds: Normal breath sounds. No wheezing or rales.  Chest:     Chest wall: No tenderness.  Abdominal:     General: Bowel sounds are normal. There is no distension.     Palpations: Abdomen is soft.     Tenderness: There is no abdominal tenderness. There is no rebound.  Musculoskeletal:     Right lower leg: No edema.     Left lower leg: No edema.       Assessment & Plan:  Atrial fibrillation, unspecified type ([place]) - Plan: PT with INR/Fingerstick  Globus sensation - Plan: Ambulatory referral to ENT Patient's INR is therapeutic.  Make no changes in his Coumadin dose and recheck in 6 weeks.  Given his history of squamous cell carcinoma and the globus sensation and dysphagia in his upper throat [place] area, I recommend an ENT consultation for direct laryngoscopy to evaluate for any scar tissue secondary to radiation and also to rule out any potential recurrence

## 2021-05-01 ENCOUNTER — Ambulatory Visit: Payer: Medicare Other | Admitting: Family Medicine

## 2021-05-01 DIAGNOSIS — Z85819 Personal history of malignant neoplasm of unspecified site of lip, oral cavity, and pharynx: Secondary | ICD-10-CM | POA: Diagnosis not present

## 2021-05-01 DIAGNOSIS — H903 Sensorineural hearing loss, bilateral: Secondary | ICD-10-CM | POA: Diagnosis not present

## 2021-05-13 ENCOUNTER — Other Ambulatory Visit: Payer: Self-pay | Admitting: Family Medicine

## 2021-05-15 DIAGNOSIS — E1122 Type 2 diabetes mellitus with diabetic chronic kidney disease: Secondary | ICD-10-CM | POA: Diagnosis not present

## 2021-05-15 DIAGNOSIS — N1832 Chronic kidney disease, stage 3b: Secondary | ICD-10-CM | POA: Diagnosis not present

## 2021-05-15 DIAGNOSIS — I4891 Unspecified atrial fibrillation: Secondary | ICD-10-CM | POA: Diagnosis not present

## 2021-05-15 DIAGNOSIS — I639 Cerebral infarction, unspecified: Secondary | ICD-10-CM | POA: Diagnosis not present

## 2021-05-15 DIAGNOSIS — I129 Hypertensive chronic kidney disease with stage 1 through stage 4 chronic kidney disease, or unspecified chronic kidney disease: Secondary | ICD-10-CM | POA: Diagnosis not present

## 2021-05-15 DIAGNOSIS — N189 Chronic kidney disease, unspecified: Secondary | ICD-10-CM | POA: Diagnosis not present

## 2021-05-15 DIAGNOSIS — I429 Cardiomyopathy, unspecified: Secondary | ICD-10-CM | POA: Diagnosis not present

## 2021-05-15 DIAGNOSIS — D509 Iron deficiency anemia, unspecified: Secondary | ICD-10-CM | POA: Diagnosis not present

## 2021-05-15 DIAGNOSIS — C109 Malignant neoplasm of oropharynx, unspecified: Secondary | ICD-10-CM | POA: Diagnosis not present

## 2021-05-20 DIAGNOSIS — C3430 Malignant neoplasm of lower lobe, unspecified bronchus or lung: Secondary | ICD-10-CM

## 2021-05-20 HISTORY — DX: Malignant neoplasm of lower lobe, unspecified bronchus or lung: C34.30

## 2021-05-22 ENCOUNTER — Ambulatory Visit: Payer: Medicare Other | Admitting: Family Medicine

## 2021-05-25 ENCOUNTER — Encounter: Payer: Self-pay | Admitting: Hematology and Oncology

## 2021-05-29 ENCOUNTER — Ambulatory Visit (HOSPITAL_COMMUNITY)
Admission: RE | Admit: 2021-05-29 | Discharge: 2021-05-29 | Disposition: A | Discharge status: Home / Self Care | Payer: Medicare HMO | Source: Ambulatory Visit | Attending: Radiation Oncology | Admitting: Radiation Oncology

## 2021-05-29 DIAGNOSIS — C01 Malignant neoplasm of base of tongue: Secondary | ICD-10-CM | POA: Insufficient documentation

## 2021-05-29 DIAGNOSIS — R918 Other nonspecific abnormal finding of lung field: Secondary | ICD-10-CM | POA: Diagnosis not present

## 2021-05-30 ENCOUNTER — Encounter: Payer: Self-pay | Admitting: Hematology and Oncology

## 2021-05-30 ENCOUNTER — Ambulatory Visit
Admission: RE | Admit: 2021-05-30 | Discharge: 2021-05-30 | Disposition: A | Discharge status: Home / Self Care | Payer: Medicare HMO | Source: Ambulatory Visit | Attending: Radiation Oncology | Admitting: Radiation Oncology

## 2021-05-30 ENCOUNTER — Other Ambulatory Visit: Payer: Self-pay

## 2021-05-30 VITALS — BP 129/75 | HR 75 | Temp 96.2°F | Resp 20 | Ht 69.0 in | Wt 179.2 lb

## 2021-05-30 DIAGNOSIS — C01 Malignant neoplasm of base of tongue: Secondary | ICD-10-CM | POA: Insufficient documentation

## 2021-05-30 DIAGNOSIS — Z510 Encounter for antineoplastic radiation therapy: Secondary | ICD-10-CM | POA: Insufficient documentation

## 2021-05-30 NOTE — Progress Notes (Signed)
Oncology Nurse Navigator Documentation  ? ?I met with Mr. Bena briefly before his follow up appointment with Dr. Isidore Moos today. He reports to me that he is doing well and denies any questions or concerns right now. He knows to call me if he needs anything.  ? ?Harlow Asa RN, BSN, OCN ?Head & Neck Oncology Nurse Navigator ?[place] [facility] ?Phone # [phone]  ?Fax # [phone]  ?

## 2021-05-30 NOTE — Progress Notes (Signed)
Mr. Molinelli presents today for a 2-week follow up after completing radiation to his base of tongue on 05/04/2020, and to review CT scan results from 05/29/2021 ? ?Pain issues, if any: Patient denies any mouth or throat pain, or ear/jaw pain ?Using a feeding tube?: N/A ?Weight changes, if any:  ?Wt Readings from Last 3 Encounters:  ?05/30/21 179 lb 4 oz (81.3 kg)  ?04/10/21 187 lb (84.8 kg)  ?03/30/21 192 lb (87.1 kg)  ? ?Swallowing issues, if any: States he occasionally struggles when eating cereal or meat that is "stringy", but otherwise denies any concerns and reports a good appetite.  ?Smoking or chewing tobacco? None ?Using fluoride trays daily? N/A--denies any mouth ulcers or dental concerns.  ?Last ENT visit was on: Reports he saw Dr. [name] about 2 weeks ago and states he performed a laryngoscopy  ?Other notable issues, if any: Denies any stiffness to his neck or difficulty turning his head side-to-side. Reports fatigue has improved greatly (though he admits he does get winded easily with minimal activity). Scheduled for H&N survivorship July 2023 ? ? ? ? ?

## 2021-05-31 ENCOUNTER — Encounter: Payer: Self-pay | Admitting: Hematology and Oncology

## 2021-06-01 ENCOUNTER — Other Ambulatory Visit: Payer: Self-pay

## 2021-06-01 ENCOUNTER — Other Ambulatory Visit: Payer: Self-pay | Admitting: Radiation Oncology

## 2021-06-01 ENCOUNTER — Encounter: Payer: Self-pay | Admitting: Radiation Oncology

## 2021-06-01 ENCOUNTER — Ambulatory Visit (INDEPENDENT_AMBULATORY_CARE_PROVIDER_SITE_OTHER): Payer: Medicare HMO | Admitting: Family Medicine

## 2021-06-01 VITALS — BP 130/90 | HR 62 | Temp 96.7°F | Ht 69.0 in | Wt 181.6 lb

## 2021-06-01 DIAGNOSIS — E1169 Type 2 diabetes mellitus with other specified complication: Secondary | ICD-10-CM

## 2021-06-01 DIAGNOSIS — I10 Essential (primary) hypertension: Secondary | ICD-10-CM | POA: Diagnosis not present

## 2021-06-01 DIAGNOSIS — R911 Solitary pulmonary nodule: Secondary | ICD-10-CM

## 2021-06-01 DIAGNOSIS — E669 Obesity, unspecified: Secondary | ICD-10-CM | POA: Diagnosis not present

## 2021-06-01 DIAGNOSIS — I4891 Unspecified atrial fibrillation: Secondary | ICD-10-CM | POA: Diagnosis not present

## 2021-06-01 DIAGNOSIS — C01 Malignant neoplasm of base of tongue: Secondary | ICD-10-CM

## 2021-06-01 DIAGNOSIS — E119 Type 2 diabetes mellitus without complications: Secondary | ICD-10-CM

## 2021-06-01 DIAGNOSIS — N1832 Chronic kidney disease, stage 3b: Secondary | ICD-10-CM | POA: Diagnosis not present

## 2021-06-01 LAB — PT WITH INR/FINGERSTICK
INR, fingerstick: 1.8 ratio — ABNORMAL HIGH
PT, fingerstick: 21.8 s — ABNORMAL HIGH (ref 10.5–13.1)
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
## 2021-06-01 NOTE — Progress Notes (Signed)
?Radiation Oncology         [phone] ?________________________________ ? ?Name: Brian [name] MRN: [id number]  ?Date: 05/30/2021  DOB: [birth year] ? ?Follow-Up Visit Note ? ?CC: [name], MD  [name], MD ? ?Diagnosis and Prior Radiotherapy:     ?  ICD-10-CM   ?1. Malignant neoplasm of base of tongue (HCC)  C01   ?  ? ? ? Cancer Staging  ?Malignant neoplasm of base of tongue ([place]) ?Staging form: Pharynx - HPV-Mediated Oropharynx, AJCC 8th Edition ?- Clinical stage from 03/01/2020: Stage III (cT2, cN3, cM0, p16+) - Signed by [name], MD on 03/02/2020 ?Stage prefix: Initial diagnosis ? ?Radiation Treatment Dates: 03/14/2020 through 05/04/2020 ?Site Technique Total Dose (Gy) Dose per Fx (Gy) Completed Fx Beam Energies  ?Neck: HN_BOT IMRT 70/70 2 35/35 6X  ? ? ?CHIEF COMPLAINT:  Here for follow-up and surveillance of throat cancer ? ?Narrative:   Brian [name] presents today for a 2-week follow up after completing radiation to his base of tongue on 05/04/2020, and to review CT scan results from 05/29/2021 ? ?Pain issues, if any: Patient denies any mouth or throat pain, or ear/jaw pain ?Using a feeding tube?: N/A ?Weight changes, if any:  ?Wt Readings from Last 3 Encounters:  ?05/30/21 179 lb 4 oz (81.3 kg)  ?04/10/21 187 lb (84.8 kg)  ?03/30/21 192 lb (87.1 kg)  ? ?Swallowing issues, if any: States he occasionally struggles when eating cereal or meat that is "stringy", but otherwise denies any concerns and reports a good appetite.  ?Smoking or chewing tobacco? None ?Using fluoride trays daily? N/A--denies any mouth ulcers or dental concerns.  ?Last ENT visit was on: Reports he saw Dr. [name] about 2 weeks ago and states he performed a laryngoscopy  ?Other notable issues, if any: Denies any stiffness to his neck or difficulty turning his head side-to-side. Reports fatigue has improved greatly (though he admits he does get winded easily with minimal activity). Scheduled for H&N survivorship July  2023 ? ? ?ALLERGIES:  is allergic to codeine and metformin and related. ? ?Meds: ?Current Outpatient Medications  ?Medication Sig Dispense Refill  ? atorvastatin (LIPITOR) 20 MG tablet TAKE 1 TABLET BY MOUTH EVERY DAY 90 tablet 3  ? Blood Glucose Monitoring Suppl (ONETOUCH VERIO IQ SYSTEM) w/Device KIT 1 each by Does not apply route 2 (two) times daily. 1 kit 0  ? carvedilol (COREG) 25 MG tablet Take 1 tablet (25 mg total) by mouth 2 (two) times daily. 180 tablet 3  ? Continuous Blood Gluc Receiver (FREESTYLE LIBRE 2 READER) DEVI Use as directed to monitor blood glucose continuously. DX E11.65 1 each 1  ? Continuous Blood Gluc Sensor (FREESTYLE LIBRE 2 SENSOR) MISC Use as directed to monitor blood glucose continuously. Change sensor Q 14 days. DX E11.65 2 each 11  ? Dulaglutide (TRULICITY) 3.55 DD/2.2GU SOPN Inject 0.75 mg into the skin once a week. 2 mL 3  ? glucose blood test strip 1 each by Other route as needed for other. Use as instructed 100 each 11  ? hydrALAZINE (APRESOLINE) 50 MG tablet TAKE 1 TABLET BY MOUTH THREE TIMES A DAY 270 tablet 1  ? Insulin Pen Needle (B-D UF III MINI PEN NEEDLES) 31G X 5 MM MISC USE DAILY WITH INSULIN 100 each 5  ? Insulin Syringe-Needle U-100 (INSULIN SYRINGE .3CC/31GX5/16") 31G X 5/16" 0.3 ML MISC Use twice daily to inject insulin. 100 each 10  ? losartan (COZAAR) 100 MG tablet Take 1  tablet (100 mg total) by mouth daily. 90 tablet 1  ? magnesium oxide (MAG-OX) 400 MG tablet Take 400 mg by mouth daily.    ? OneTouch Delica Lancets 96V MISC 1 each by Does not apply route 2 (two) times daily. 200 each 11  ? pioglitazone (ACTOS) 15 MG tablet TAKE 1 TABLET BY MOUTH EVERY DAY 90 tablet 2  ? warfarin (COUMADIN) 10 MG tablet Take 1 tablet (10 mg total) by mouth daily. 90 tablet 3  ? ?No current facility-administered medications for this encounter.  ? ? ?Physical Findings: ?The patient is in no acute distress. Patient is alert and oriented. ?Wt Readings from Last 3 Encounters:   ?05/30/21 179 lb 4 oz (81.3 kg)  ?04/10/21 187 lb (84.8 kg)  ?03/30/21 192 lb (87.1 kg)  ? ? height is _0  (1.753 m) and weight is 179 lb 4 oz (81.3 kg). His temporal temperature is 96.2 ?F (35.7 ?C) (abnormal). His blood pressure is 129/75 and his pulse is 75. His respiration is 20 and oxygen saturation is 100%. Marland Kitchen  ?General: Alert and oriented, in no acute distress ?HEENT: Head is normocephalic. Extraocular movements are intact. Oropharynx is notable for no thrush,  no tumor.  He is hard of hearing.    ?Neck: anterior lymphedema, no obvious adenopathy to deep palpation. ?Skin: neck skin intact and satisfactorily healed ?Psychiatric: Pleasant to speak with. Affect is appropriate. ?Heart RRR ?Chest CTAB   ? ? ?Lab Findings: ?Lab Results  ?Component Value Date  ? WBC 5.3 03/20/2021  ? HGB 8.8 (L) 03/20/2021  ? HCT 27.8 (L) 03/20/2021  ? MCV 92.4 03/20/2021  ? PLT 187 03/20/2021  ? ? ?Lab Results  ?Component Value Date  ? TSH 1.583 09/16/2020  ? ? ?Radiographic Findings: ?CT Chest Wo Contrast ? ?Result Date: 05/29/2021 ?CLINICAL DATA:  History of oropharyngeal carcinoma. Follow-up lung nodule EXAM: CT CHEST WITHOUT CONTRAST TECHNIQUE: Multidetector CT imaging of the chest was performed following the standard protocol without IV contrast. RADIATION DOSE REDUCTION: This exam was performed according to the departmental dose-optimization program which includes automated exposure control, adjustment of the mA and/or kV according to patient size and/or use of iterative reconstruction technique. COMPARISON:  PET-CT 01/17/2021, chest CT 12/30/2020 FINDINGS: Cardiovascular: Heart size is normal. No pericardial effusion identified. Extensive coronary artery calcifications. Main pulmonary artery is normal caliber. Ascending thoracic aorta is dilated measuring 4.1 cm in diameter. Tortuous thoracic aorta with severe atherosclerotic plaques. Mediastinum/Nodes: No bulky axillary, hilar or mediastinal lymphadenopathy identified.  Lungs/Pleura: Left apical pleural thickening with improved adjacent subpleural ground-glass densities since previous study. Stable chronic pulmonary nodules in the right middle lobe measuring 4 mm and 5 mm in size. Interval enlargement of the nodule in the left lower lobe now measuring 12 x 11 mm and abutting the pleura, compared to 4 mm previously. No new nodules identified. No pleural effusion or pneumothorax. No endobronchial lesion identified. Upper Abdomen: No acute abnormality. Musculoskeletal: Healing fracture deformity of the body of the sternum. Degenerative changes in the thoracic spine. No suspicious bony lesions identified. IMPRESSION: 1. Interval enlargement of a left lower lobe pulmonary nodule now measuring 12 x 11 mm, highly concerning for metastasis/neoplasm. Consider PET-CT and/or tissue sampling. 2. Dilated ascending aorta measuring 4.1 cm diameter. Recommend annual imaging followup by CTA or MRA. This recommendation follows 2010 ACCF/AHA/AATS/ACR/ASA/SCA/SCAI/SIR/STS/SVM Guidelines for the Diagnosis and Management of Patients with Thoracic Aortic Disease. Circulation. 2010; 121: E938-B017. Aortic aneurysm NOS (ICD10-I71.9) 3. Coronary artery disease and atherosclerotic disease.  Aortic Atherosclerosis (ICD10-I70.0). Electronically Signed   By: Ofilia Neas M.D.   On: 05/29/2021 11:26   ? ? ?Impression/Plan:   ? ?1) Head and Neck Cancer Status: We discussed his CT scan and I personally reviewed his imaging with him. 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  We discussed option of biopsy vs  PET scan for further workup. He wishes to avoid invasive measures and will proceed with PET.  We will also tentatively schedule his for SBRT simulation at followup following PET.  He is not enthusiastic about surgery and would like to pursue radiation to this lesion if it remains suspicious on imaging.  PFTs ordered as well for baseline lung function. ? ?2) Nutritional  Status:  tolerating oral intake, PEG removed ?Wt Readings from Last 3 Encounters:  ?05/30/21 179 lb 4 oz (81.3 kg)  ?04/10/21 187 lb (84.8 kg)  ?03/30/21 192 lb (87.1 kg)  ? ?  ?3)   Swallowing: Excellent function

## 2021-06-02 ENCOUNTER — Encounter: Payer: Self-pay | Admitting: Hematology and Oncology

## 2021-06-02 LAB — COMPLETE METABOLIC PANEL WITH GFR
AG Ratio: 1.8 (calc) (ref 1.0–2.5)
ALT: 10 U/L (ref 9–46)
AST: 12 U/L (ref 10–35)
Albumin: 3.8 g/dL (ref 3.6–5.1)
Alkaline phosphatase (APISO): 50 U/L (ref 35–144)
BUN/Creatinine Ratio: 16 (calc) (ref 6–22)
BUN: 34 mg/dL — ABNORMAL HIGH (ref 7–25)
CO2: 27 mmol/L (ref 20–32)
Calcium: 9.1 mg/dL (ref 8.6–10.3)
Chloride: 104 mmol/L (ref 98–110)
Creat: 2.19 mg/dL — ABNORMAL HIGH (ref 0.70–1.35)
Globulin: 2.1 g/dL (calc) (ref 1.9–3.7)
Glucose, Bld: 151 mg/dL — ABNORMAL HIGH (ref 65–99)
Potassium: 5.1 mmol/L (ref 3.5–5.3)
Sodium: 137 mmol/L (ref 135–146)
Total Bilirubin: 0.2 mg/dL (ref 0.2–1.2)
Total Protein: 5.9 g/dL — ABNORMAL LOW (ref 6.1–8.1)
eGFR: 32 mL/min/{1.73_m2} — ABNORMAL LOW (ref >=60)

## 2021-06-02 LAB — CBC WITH DIFFERENTIAL/PLATELET
Absolute Monocytes: 475 cells/uL (ref 200–950)
Basophils Absolute: 21 cells/uL (ref 0–200)
Basophils Relative: 0.5 %
Eosinophils Absolute: 71 cells/uL (ref 15–500)
Eosinophils Relative: 1.7 %
HCT: 31.8 % — ABNORMAL LOW (ref 38.5–50.0)
Hemoglobin: 10 g/dL — ABNORMAL LOW (ref 13.2–17.1)
Lymphs Abs: 882 cells/uL (ref 850–3900)
MCH: 27.1 pg (ref 27.0–33.0)
MCHC: 31.4 g/dL — ABNORMAL LOW (ref 32.0–36.0)
MCV: 86.2 fL (ref 80.0–100.0)
MPV: 9.3 fL (ref 7.5–12.5)
Monocytes Relative: 11.3 %
Neutro Abs: 2751 cells/uL (ref 1500–7800)
Neutrophils Relative %: 65.5 %
Platelets: 211 10*3/uL (ref 140–400)
RBC: 3.69 10*6/uL — ABNORMAL LOW (ref 4.20–5.80)
RDW: 14.5 % (ref 11.0–15.0)
Total Lymphocyte: 21 %
WBC: 4.2 10*3/uL (ref 3.8–10.8)

## 2021-06-02 LAB — HEMOGLOBIN A1C
Hgb A1c MFr Bld: 7.5 % of total Hgb — ABNORMAL HIGH (ref <5.7)
Mean Plasma Glucose: 169 mg/dL
eAG (mmol/L): 9.3 mmol/L

## 2021-06-07 ENCOUNTER — Ambulatory Visit (HOSPITAL_COMMUNITY)
Admission: RE | Admit: 2021-06-07 | Discharge: 2021-06-07 | Disposition: A | Discharge status: Home / Self Care | Payer: Medicare HMO | Source: Ambulatory Visit | Attending: Radiation Oncology | Admitting: Radiation Oncology

## 2021-06-07 DIAGNOSIS — R911 Solitary pulmonary nodule: Secondary | ICD-10-CM | POA: Insufficient documentation

## 2021-06-07 DIAGNOSIS — R0600 Dyspnea, unspecified: Secondary | ICD-10-CM | POA: Insufficient documentation

## 2021-06-07 DIAGNOSIS — C01 Malignant neoplasm of base of tongue: Secondary | ICD-10-CM | POA: Insufficient documentation

## 2021-06-07 LAB — PULMONARY FUNCTION TEST
DL/VA % pred: 75 %
DL/VA: 3.11 ml/min/mmHg/L
DLCO cor % pred: 71 %
DLCO cor: 18.26 ml/min/mmHg
DLCO unc % pred: 59 %
DLCO unc: 15.35 ml/min/mmHg
FEF 25-75 Post: 4.47 L/sec
FEF 25-75 Pre: 3.6 L/sec
FEF2575-%Change-Post: 24 %
FEF2575-%Pred-Post: 176 %
FEF2575-%Pred-Pre: 142 %
FEV1-%Change-Post: 2 %
FEV1-%Pred-Post: 103 %
FEV1-%Pred-Pre: 100 %
FEV1-Post: 3.34 L
FEV1-Pre: 3.27 L
FEV1FVC-%Change-Post: 0 %
FEV1FVC-%Pred-Pre: 111 %
FEV6-%Change-Post: 1 %
FEV6-%Pred-Post: 96 %
FEV6-%Pred-Pre: 95 %
FEV6-Post: 4 L
FEV6-Pre: 3.95 L
FEV6FVC-%Change-Post: 0 %
FEV6FVC-%Pred-Post: 105 %
FEV6FVC-%Pred-Pre: 105 %
FVC-%Change-Post: 1 %
FVC-%Pred-Post: 92 %
FVC-%Pred-Pre: 90 %
FVC-Post: 4.03 L
FVC-Pre: 3.95 L
Post FEV1/FVC ratio: 83 %
Post FEV6/FVC ratio: 99 %
Pre FEV1/FVC ratio: 83 %
Pre FEV6/FVC Ratio: 100 %
RV % pred: 73 %
RV: 1.71 L
TLC % pred: 89 %
TLC: 6.09 L

## 2021-06-07 MED ORDER — ALBUTEROL SULFATE (2.5 MG/3ML) 0.083% IN NEBU
2.5000 mg | INHALATION_SOLUTION | Freq: Once | RESPIRATORY_TRACT | Status: AC
  Administered 2021-06-07: 2.5 mg via RESPIRATORY_TRACT

## 2021-06-08 ENCOUNTER — Ambulatory Visit (HOSPITAL_COMMUNITY)
Admission: RE | Admit: 2021-06-08 | Discharge: 2021-06-08 | Disposition: A | Discharge status: Home / Self Care | Payer: Medicare HMO | Source: Ambulatory Visit | Attending: Radiation Oncology | Admitting: Radiation Oncology

## 2021-06-08 ENCOUNTER — Encounter: Payer: Self-pay | Admitting: Physical Therapy

## 2021-06-08 DIAGNOSIS — R911 Solitary pulmonary nodule: Secondary | ICD-10-CM | POA: Diagnosis not present

## 2021-06-08 DIAGNOSIS — R0600 Dyspnea, unspecified: Secondary | ICD-10-CM | POA: Diagnosis not present

## 2021-06-08 DIAGNOSIS — C01 Malignant neoplasm of base of tongue: Secondary | ICD-10-CM | POA: Diagnosis not present

## 2021-06-08 LAB — GLUCOSE, CAPILLARY: Glucose-Capillary: 140 mg/dL — ABNORMAL HIGH (ref 70–99)

## 2021-06-08 MED ORDER — FLUDEOXYGLUCOSE F - 18 (FDG) INJECTION
9.4500 | Freq: Once | INTRAVENOUS | Status: AC
  Administered 2021-06-08: 9.45 via INTRAVENOUS

## 2021-06-12 ENCOUNTER — Other Ambulatory Visit: Payer: Self-pay

## 2021-06-12 ENCOUNTER — Ambulatory Visit
Admission: RE | Admit: 2021-06-12 | Discharge: 2021-06-12 | Disposition: A | Discharge status: Home / Self Care | Payer: Medicare HMO | Source: Ambulatory Visit | Attending: Radiation Oncology | Admitting: Radiation Oncology

## 2021-06-12 DIAGNOSIS — C3432 Malignant neoplasm of lower lobe, left bronchus or lung: Secondary | ICD-10-CM

## 2021-06-12 DIAGNOSIS — C01 Malignant neoplasm of base of tongue: Secondary | ICD-10-CM | POA: Diagnosis not present

## 2021-06-12 DIAGNOSIS — Z510 Encounter for antineoplastic radiation therapy: Secondary | ICD-10-CM | POA: Diagnosis not present

## 2021-06-12 NOTE — Progress Notes (Signed)
[name] presents today for follow-up before his CT simulation appointment and to review PET scan from 06/08/2021 ? ?Denies any mouth or throat pain, but does report report difficulty swallowing solid foods (like stringy meats); states they often feel like they get caught in his throat and he has to cough them back up. Denies worsening shortness of breath or a productive cough. Denies any mouth sores or sensitive spots.  ? ?Wt Readings from Last 3 Encounters:  ?06/12/21 184 lb 8 oz (83.7 kg)  ?06/01/21 181 lb 9.6 oz (82.4 kg)  ?05/30/21 179 lb 4 oz (81.3 kg)  ? ? ?

## 2021-06-13 ENCOUNTER — Encounter: Payer: Self-pay | Admitting: Radiation Oncology

## 2021-06-13 DIAGNOSIS — C3432 Malignant neoplasm of lower lobe, left bronchus or lung: Secondary | ICD-10-CM | POA: Insufficient documentation

## 2021-06-13 NOTE — Progress Notes (Addendum)
?Radiation Oncology         [phone] ?________________________________ ? ?Name: Brian [name] MRN: [id number]  ?Date: 06/12/2021  DOB: [birth year] ? ?Follow-Up Visit Note ? ?CC: Brian Frizzle, MD  Brian Frizzle, MD ? ?Diagnosis and Prior Radiotherapy:     ?  ICD-10-CM   ?1. 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  ? ? ? Cancer Staging  ?Malignant neoplasm of base of tongue ([place]) ?Staging form: Pharynx - HPV-Mediated Oropharynx, AJCC 8th Edition ?- Clinical stage from 03/01/2020: Stage III (cT2, cN3, cM0, p16+) - Signed by [name], MD on 03/02/2020 ?Stage prefix: Initial diagnosis ? ?Radiation Treatment Dates: 03/14/2020 through 05/04/2020 ?Site Technique Total Dose (Gy) Dose per Fx (Gy) Completed Fx Beam Energies  ?Neck: HN_BOT IMRT 70/70 2 35/35 6X  ? ? ?CHIEF COMPLAINT:  Here for follow-up and surveillance of throat cancer ? ?Narrative:  Brian [name] presents today for follow-up before his CT simulation appointment and to review PET scan from 06/08/2021 ? ?Denies any mouth or throat pain, but does report report difficulty swallowing solid foods (like stringy meats); states they often feel like they get caught in his throat and he has to cough them back up. Denies worsening shortness of breath or a productive cough. Denies any mouth sores or sensitive spots.  Recently completed laryngoscopy with Dr. Benjamine Mola less than a month ago ? ?Wt Readings from Last 3 Encounters:  ?06/12/21 184 lb 8 oz (83.7 kg)  ?06/01/21 181 lb 9.6 oz (82.4 kg)  ?05/30/21 179 lb 4 oz (81.3 kg)  ? ? ? ?ALLERGIES:  is allergic to codeine and metformin and related. ? ?Meds: ?Current Outpatient Medications  ?Medication Sig Dispense Refill  ? atorvastatin (LIPITOR) 20 MG tablet TAKE 1 TABLET BY MOUTH EVERY DAY 90 tablet 3  ? Blood Glucose Monitoring Suppl (ONETOUCH VERIO IQ SYSTEM) w/Device KIT 1 each by Does not apply route 2 (two) times daily. 1 kit 0  ? carvedilol (COREG) 25 MG tablet Take 1 tablet (25 mg  total) by mouth 2 (two) times daily. 180 tablet 3  ? Continuous Blood Gluc Receiver (FREESTYLE LIBRE 2 READER) DEVI Use as directed to monitor blood glucose continuously. DX E11.65 1 each 1  ? Continuous Blood Gluc Sensor (FREESTYLE LIBRE 2 SENSOR) MISC Use as directed to monitor blood glucose continuously. Change sensor Q 14 days. DX E11.65 2 each 11  ? Dulaglutide (TRULICITY) 2.29 NL/8.9QJ SOPN Inject 0.75 mg into the skin once a week. 2 mL 3  ? glucose blood test strip 1 each by Other route as needed for other. Use as instructed 100 each 11  ? hydrALAZINE (APRESOLINE) 50 MG tablet TAKE 1 TABLET BY MOUTH THREE TIMES A DAY 270 tablet 1  ? Insulin Pen Needle (B-D UF III MINI PEN NEEDLES) 31G X 5 MM MISC USE DAILY WITH INSULIN 100 each 5  ? Insulin Syringe-Needle U-100 (INSULIN SYRINGE .3CC/31GX5/16") 31G X 5/16" 0.3 ML MISC Use twice daily to inject insulin. 100 each 10  ? losartan (COZAAR) 100 MG tablet Take 1 tablet (100 mg total) by mouth daily. 90 tablet 1  ? magnesium oxide (MAG-OX) 400 MG tablet Take 400 mg by mouth daily.    ? OneTouch Delica Lancets 19E MISC 1 each by Does not apply route 2 (two) times daily. 200 each 11  ? pioglitazone (ACTOS) 15 MG tablet TAKE 1 TABLET BY MOUTH EVERY DAY 90 tablet 2  ? warfarin (COUMADIN)  10 MG tablet Take 1 tablet (10 mg total) by mouth daily. 90 tablet 3  ? ?No current facility-administered medications for this encounter.  ? ? ?Physical Findings: ?The patient is in no acute distress. Patient is alert and oriented. ?Wt Readings from Last 3 Encounters:  ?06/12/21 184 lb 8 oz (83.7 kg)  ?06/01/21 181 lb 9.6 oz (82.4 kg)  ?05/30/21 179 lb 4 oz (81.3 kg)  ? ? height is 5' 9"  (1.753 m) and weight is 184 lb 8 oz (83.7 kg). His temporal temperature is 96.8 ?F (36 ?C) (abnormal). His blood pressure is 117/61 and his pulse is 72. His respiration is 20 and oxygen saturation is 100%. Marland Kitchen  ?General: Alert and oriented, in no acute distress ?HEENT: Head is normocephalic. Extraocular  movements are intact. Oropharynx is notable for no thrush,  no tumor.  He is hard of hearing.   Tongue is midline with no palpable masses ?Neck: anterior lymphedema, no obvious adenopathy to deep palpation. ?Skin: neck skin intact and satisfactorily healed ?Psychiatric: Pleasant to speak with. Affect is appropriate. ?Heart RRR ?Chest CTAB   ? ? ?Lab Findings: ?Lab Results  ?Component Value Date  ? WBC 4.2 06/01/2021  ? HGB 10.0 (L) 06/01/2021  ? HCT 31.8 (L) 06/01/2021  ? MCV 86.2 06/01/2021  ? PLT 211 06/01/2021  ? ? ?Lab Results  ?Component Value Date  ? TSH 1.583 09/16/2020  ? ? ?Radiographic Findings: ?CT Chest Wo Contrast ? ?Result Date: 05/29/2021 ?CLINICAL DATA:  History of oropharyngeal carcinoma. Follow-up lung nodule EXAM: CT CHEST WITHOUT CONTRAST TECHNIQUE: Multidetector CT imaging of the chest was performed following the standard protocol without IV contrast. RADIATION DOSE REDUCTION: This exam was performed according to the departmental dose-optimization program which includes automated exposure control, adjustment of the mA and/or kV according to patient size and/or use of iterative reconstruction technique. COMPARISON:  PET-CT 01/17/2021, chest CT 12/30/2020 FINDINGS: Cardiovascular: Heart size is normal. No pericardial effusion identified. Extensive coronary artery calcifications. Main pulmonary artery is normal caliber. Ascending thoracic aorta is dilated measuring 4.1 cm in diameter. Tortuous thoracic aorta with severe atherosclerotic plaques. Mediastinum/Nodes: No bulky axillary, hilar or mediastinal lymphadenopathy identified. Lungs/Pleura: Left apical pleural thickening with improved adjacent subpleural ground-glass densities since previous study. Stable chronic pulmonary nodules in the right middle lobe measuring 4 mm and 5 mm in size. Interval enlargement of the nodule in the left lower lobe now measuring 12 x 11 mm and abutting the pleura, compared to 4 mm previously. No new nodules  identified. No pleural effusion or pneumothorax. No endobronchial lesion identified. Upper Abdomen: No acute abnormality. Musculoskeletal: Healing fracture deformity of the body of the sternum. Degenerative changes in the thoracic spine. No suspicious bony lesions identified. IMPRESSION: 1. Interval enlargement of a left lower lobe pulmonary nodule now measuring 12 x 11 mm, highly concerning for metastasis/neoplasm. Consider PET-CT and/or tissue sampling. 2. Dilated ascending aorta measuring 4.1 cm diameter. Recommend annual imaging followup by CTA or MRA. This recommendation follows 2010 ACCF/AHA/AATS/ACR/ASA/SCA/SCAI/SIR/STS/SVM Guidelines for the Diagnosis and Management of Patients with Thoracic Aortic Disease. Circulation. 2010; 121: X448-J856. Aortic aneurysm NOS (ICD10-I71.9) 3. Coronary artery disease and atherosclerotic disease. Aortic Atherosclerosis (ICD10-I70.0). Electronically Signed   By: Ofilia Neas M.D.   On: 05/29/2021 11:26  ? ?NM PET Image Restag (PS) Skull Base To Thigh ? ?Result Date: 06/09/2021 ?CLINICAL DATA:  Initial treatment strategy for pulmonary nodule. History of squamous cell carcinoma tongue. Status post radiation treatment January 2022. EXAM: NUCLEAR MEDICINE PET SKULL  BASE TO THIGH TECHNIQUE: 9.4 mCi F-18 FDG was injected intravenously. Full-ring PET imaging was performed from the skull base to thigh after the radiotracer. CT data was obtained and used for attenuation correction and anatomic localization. Fasting blood glucose: 140 mg/dl COMPARISON:  CT 05/29/2021, PET-CT 01/17/2021 FINDINGS: Mediastinal blood pool activity: SUV max 3.0 Liver activity: SUV max 3.7 NECK: No hypermetabolic lymph nodes in the neck. There is asymmetric activity at the RIGHT base of tongue with SUV max equal 8.5 (image 32). There is misregistration between the PET data set and CT data set due to patient motion. Incidental CT findings: none CHEST: The nodule of concern in the LEFT lower lobe measures  11 mm (image 100/4) and has associated metabolic activity with SUV max equal 3.9. No hypermetabolic mediastinal nodes. Two small nodules in the RIGHT middle lobe (image 97/4) are unchanged and too small to characteri

## 2021-06-16 DIAGNOSIS — Z510 Encounter for antineoplastic radiation therapy: Secondary | ICD-10-CM | POA: Diagnosis not present

## 2021-06-16 DIAGNOSIS — C3432 Malignant neoplasm of lower lobe, left bronchus or lung: Secondary | ICD-10-CM | POA: Diagnosis not present

## 2021-06-16 DIAGNOSIS — C01 Malignant neoplasm of base of tongue: Secondary | ICD-10-CM | POA: Diagnosis not present

## 2021-06-19 ENCOUNTER — Other Ambulatory Visit: Payer: Self-pay

## 2021-06-19 DIAGNOSIS — C3432 Malignant neoplasm of lower lobe, left bronchus or lung: Secondary | ICD-10-CM | POA: Diagnosis not present

## 2021-06-19 MED ORDER — PIOGLITAZONE HCL 30 MG PO TABS: 30.0000 mg | ORAL_TABLET | Freq: Every day | ORAL | 1 refills | Status: DC

## 2021-06-21 ENCOUNTER — Ambulatory Visit: Payer: Medicare HMO

## 2021-06-21 ENCOUNTER — Ambulatory Visit
Admission: RE | Admit: 2021-06-21 | Discharge: 2021-06-21 | Disposition: A | Discharge status: Home / Self Care | Payer: Medicare HMO | Source: Ambulatory Visit | Attending: Radiation Oncology | Admitting: Radiation Oncology

## 2021-06-21 ENCOUNTER — Other Ambulatory Visit: Payer: Self-pay

## 2021-06-21 DIAGNOSIS — C3432 Malignant neoplasm of lower lobe, left bronchus or lung: Secondary | ICD-10-CM | POA: Insufficient documentation

## 2021-06-21 DIAGNOSIS — Z510 Encounter for antineoplastic radiation therapy: Secondary | ICD-10-CM | POA: Diagnosis not present

## 2021-06-21 LAB — RAD ONC ARIA SESSION SUMMARY
Course Elapsed Days: 0
Plan Fractions Treated to Date: 1
Plan Prescribed Dose Per Fraction: 12 Gy
Plan Total Fractions Prescribed: 5
Plan Total Prescribed Dose: 60 Gy
Reference Point Dosage Given to Date: 12 Gy
Reference Point Session Dosage Given: 12 Gy
Session Number: 1

## 2021-06-23 ENCOUNTER — Ambulatory Visit
Admission: RE | Admit: 2021-06-23 | Discharge: 2021-06-23 | Disposition: A | Discharge status: Home / Self Care | Payer: Medicare HMO | Source: Ambulatory Visit | Attending: Radiation Oncology | Admitting: Radiation Oncology

## 2021-06-23 ENCOUNTER — Other Ambulatory Visit: Payer: Self-pay

## 2021-06-23 ENCOUNTER — Ambulatory Visit: Payer: Medicare HMO

## 2021-06-23 DIAGNOSIS — Z510 Encounter for antineoplastic radiation therapy: Secondary | ICD-10-CM | POA: Diagnosis not present

## 2021-06-23 DIAGNOSIS — C3432 Malignant neoplasm of lower lobe, left bronchus or lung: Secondary | ICD-10-CM | POA: Diagnosis not present

## 2021-06-23 LAB — RAD ONC ARIA SESSION SUMMARY
Course Elapsed Days: 2
Plan Fractions Treated to Date: 2
Plan Prescribed Dose Per Fraction: 12 Gy
Plan Total Fractions Prescribed: 5
Plan Total Prescribed Dose: 60 Gy
Reference Point Dosage Given to Date: 24 Gy
Reference Point Session Dosage Given: 12 Gy
Session Number: 2

## 2021-06-25 ENCOUNTER — Other Ambulatory Visit: Payer: Self-pay | Admitting: Family Medicine

## 2021-06-26 ENCOUNTER — Other Ambulatory Visit: Payer: Self-pay | Admitting: Family Medicine

## 2021-06-26 ENCOUNTER — Other Ambulatory Visit: Payer: Self-pay

## 2021-06-26 ENCOUNTER — Telehealth: Payer: Self-pay

## 2021-06-26 ENCOUNTER — Ambulatory Visit: Payer: Medicare HMO

## 2021-06-26 ENCOUNTER — Ambulatory Visit
Admission: RE | Admit: 2021-06-26 | Discharge: 2021-06-26 | Disposition: A | Discharge status: Home / Self Care | Payer: Medicare HMO | Source: Ambulatory Visit | Attending: Radiation Oncology | Admitting: Radiation Oncology

## 2021-06-26 DIAGNOSIS — Z510 Encounter for antineoplastic radiation therapy: Secondary | ICD-10-CM | POA: Diagnosis not present

## 2021-06-26 DIAGNOSIS — C3432 Malignant neoplasm of lower lobe, left bronchus or lung: Secondary | ICD-10-CM | POA: Diagnosis not present

## 2021-06-26 LAB — RAD ONC ARIA SESSION SUMMARY
Course Elapsed Days: 5
Plan Fractions Treated to Date: 3
Plan Prescribed Dose Per Fraction: 12 Gy
Plan Total Fractions Prescribed: 5
Plan Total Prescribed Dose: 60 Gy
Reference Point Dosage Given to Date: 36 Gy
Reference Point Session Dosage Given: 12 Gy
Session Number: 3

## 2021-06-26 NOTE — Telephone Encounter (Signed)
Requested medications are due for refill today.  yes ? ?Requested medications are on the active medications list.  yes ? ?Last refill. 05/13/2020 #90 3 refills ? ?Future visit scheduled.   yes ? ?Notes to clinic.  Medication refill is not delegated. ? ? ? ?Requested Prescriptions  ?Pending Prescriptions Disp Refills  ? warfarin (COUMADIN) 10 MG tablet [Pharmacy Med Name: WARFARIN SODIUM 10 MG TABLET] 90 tablet 3  ?  Sig: TAKE 1 TABLET BY MOUTH EVERY DAY  ?  ? Hematology:  Anticoagulants - warfarin Failed - 06/25/2021  9:32 AM  ?  ?  Failed - Manual Review: If patient's warfarin is managed by Anti-Coag team, route request to them. If not, route request to the provider.  ?  ?  Failed - INR in normal range and within 30 days  ?  INR, fingerstick  ?Date Value Ref Range Status  ?06/01/2021 1.8 (H) ratio Final  ?  Comment:  ?  INRs >2.9 may be falsely elevated in patients receiving ?either unfractionated Heparin or Low Molecular Weight ?Heparin. Follow up testing in a hospital laboratory ?may be helpful if clinically indicated.  ?. ?INR results of > or = 5.0 should be verified using ?the standard venipuncture procedure. ?Reference Range                     0.9-1.1 ?Moderate-intensity Warfarin Therapy 2.0-3.0 ?Higher-intensity Warfarin Therapy   3.0-4.0  ?. ?  ?  ?  ?  ?  Failed - HCT in normal range and within 360 days  ?  HCT  ?Date Value Ref Range Status  ?06/01/2021 31.8 (L) 38.5 - 50.0 % Final  ?  ?  ?  ?  Passed - Patient is not pregnant  ?  ?  Passed - Valid encounter within last 3 months  ?  Recent Outpatient Visits   ? ?      ? 3 weeks ago Atrial fibrillation, unspecified type ([place])  ? [facility] Pickard, Cammie Mcgee, MD  ? 2 months ago Atrial fibrillation, unspecified type [facility])  ? [facility] Pickard, Cammie Mcgee, MD  ? 3 months ago Atrial fibrillation, unspecified type [facility])  ? [facility] Pickard, Cammie Mcgee, MD  ? 3 months ago Atrial fibrillation, unspecified type  [facility])  ? [facility] Pickard, Cammie Mcgee, MD  ? 4 months ago Type 2 diabetes mellitus with obesity ([place])  ? [facility] Pickard, Cammie Mcgee, MD  ? ?  ?  ?Future Appointments   ? ?        ? In 2 weeks Pickard, Cammie Mcgee, MD [place]  ? ?  ? ? ?  ?  ?  ?  ?

## 2021-06-26 NOTE — Telephone Encounter (Signed)
Patient LVM stating [employer] was supposed to fax [name] a form regarding a Tier Exception for Trulicity.  ? ?At this time no form has been received. ? ?Called [employer] to inquire. They had our fax number incorrect. This has been updated. Tier Exception form completed verbally with agent. Ref# [id number]. Decision within 72 hours, will be faxed to office. ? ?[facility] to advise patient.  ?

## 2021-06-26 NOTE — Telephone Encounter (Signed)
Received fax from [facility] on [address]. Requesting a refill on losartan (COZAAR) 100 MG tablet [[id number]]  ?  Order Details ?Dose: 100 mg Route: Oral Frequency: Daily  ?Dispense Quantity: 90 tablet Refills: 1   ?     ?Sig: Take 1 tablet (100 mg total) by mouth daily.  ?     ?Start Date: 09/22/20 End Date: --  ?Written Date: 09/22/20 Expiration Date: 09/22/21  ?Original Order:  losartan (COZAAR) 50 MG tablet [[id number]]  ?Providers ? ?Ordering and Authorizing Provider:   ?[name], MD  ?[address] [place], [place] [address]  ?Phone:  [phone]   Fax:  [phone]  ?DEA #:  [id number]   NPI:  [id number]   ?   ?Ordering User:  Six, Eden Lathe, LPN   ?   ? ?Pharmacy ? ?[id number] [place], [name] - [address]  ?[address], [place] [address]   ? ?

## 2021-06-27 MED ORDER — LOSARTAN POTASSIUM 100 MG PO TABS: 100.0000 mg | ORAL_TABLET | Freq: Every day | ORAL | 0 refills | Status: DC

## 2021-06-27 NOTE — Telephone Encounter (Signed)
Requested Prescriptions  ?Pending Prescriptions Disp Refills  ?? losartan (COZAAR) 100 MG tablet 90 tablet 1  ?  Sig: Take 1 tablet (100 mg total) by mouth daily.  ?  ? Cardiovascular:  Angiotensin Receptor Blockers Failed - 06/26/2021  4:40 PM  ?  ?  Failed - Cr in normal range and within 180 days  ?  Creat  ?Date Value Ref Range Status  ?06/01/2021 2.19 (H) 0.70 - 1.35 mg/dL Final  ? ?Creatinine, Urine  ?Date Value Ref Range Status  ?12/12/2009 226.8 mg/dL Final  ?   ?  ?  Passed - K in normal range and within 180 days  ?  Potassium  ?Date Value Ref Range Status  ?06/01/2021 5.1 3.5 - 5.3 mmol/L Final  ?   ?  ?  Passed - Patient is not pregnant  ?  ?  Passed - Last BP in normal range  ?  BP Readings from Last 1 Encounters:  ?06/12/21 117/61  ?   ?  ?  Passed - Valid encounter within last 6 months  ?  Recent Outpatient Visits   ?      ? 3 weeks ago Atrial fibrillation, unspecified type ([place])  ? [facility] Pickard, Cammie Mcgee, MD  ? 2 months ago Atrial fibrillation, unspecified type [facility])  ? [facility] Pickard, Cammie Mcgee, MD  ? 3 months ago Atrial fibrillation, unspecified type [facility])  ? [facility] Pickard, Cammie Mcgee, MD  ? 3 months ago Atrial fibrillation, unspecified type [facility])  ? [facility] Pickard, Cammie Mcgee, MD  ? 4 months ago Type 2 diabetes mellitus with obesity ([place])  ? [facility] Pickard, Cammie Mcgee, MD  ?  ?  ?Future Appointments   ?        ? In 2 weeks Pickard, Cammie Mcgee, MD [place], [facility]  ?  ? ?  ?  ?  ? ?

## 2021-06-28 ENCOUNTER — Ambulatory Visit: Admission: RE | Admit: 2021-06-28 | Payer: Medicare HMO | Source: Ambulatory Visit | Admitting: Radiation Oncology

## 2021-06-28 ENCOUNTER — Ambulatory Visit: Payer: Medicare HMO

## 2021-06-28 ENCOUNTER — Other Ambulatory Visit: Payer: Self-pay

## 2021-06-28 ENCOUNTER — Ambulatory Visit
Admission: RE | Admit: 2021-06-28 | Discharge: 2021-06-28 | Disposition: A | Discharge status: Home / Self Care | Payer: Medicare HMO | Source: Ambulatory Visit | Attending: Radiation Oncology | Admitting: Radiation Oncology

## 2021-06-28 NOTE — Telephone Encounter (Signed)
Per [employer], Tier Exception was DENIED. ? ?Per [employer] patient has a $7400 medication coverage gap (deductible) and has only met $754.57 to date. This means he will pay 25% of all medications until this amount has been met, then he will pay 5% of medications.  ? ?Spoke with patient's son, Marjory Lies (DPR), and he advised this was not his understanding of the patient's plan. I advised he contact [employer] or his insurance agent for assistance. He states he will call them today. Advised him to let us know if he needs further assistance.  ? ? ?

## 2021-06-30 ENCOUNTER — Ambulatory Visit: Payer: Medicare HMO

## 2021-06-30 ENCOUNTER — Ambulatory Visit: Payer: Medicare HMO | Admitting: Radiation Oncology

## 2021-06-30 DIAGNOSIS — C3432 Malignant neoplasm of lower lobe, left bronchus or lung: Secondary | ICD-10-CM | POA: Diagnosis not present

## 2021-06-30 DIAGNOSIS — Z510 Encounter for antineoplastic radiation therapy: Secondary | ICD-10-CM | POA: Diagnosis not present

## 2021-07-03 ENCOUNTER — Ambulatory Visit: Payer: Medicare HMO | Admitting: Radiation Oncology

## 2021-07-03 DIAGNOSIS — C3432 Malignant neoplasm of lower lobe, left bronchus or lung: Secondary | ICD-10-CM | POA: Diagnosis not present

## 2021-07-03 DIAGNOSIS — Z510 Encounter for antineoplastic radiation therapy: Secondary | ICD-10-CM | POA: Diagnosis not present

## 2021-07-04 ENCOUNTER — Ambulatory Visit
Admission: RE | Admit: 2021-07-04 | Discharge: 2021-07-04 | Disposition: A | Discharge status: Home / Self Care | Payer: Medicare HMO | Source: Ambulatory Visit | Attending: Radiation Oncology | Admitting: Radiation Oncology

## 2021-07-04 ENCOUNTER — Ambulatory Visit: Payer: Medicare HMO | Admitting: Radiation Oncology

## 2021-07-04 ENCOUNTER — Other Ambulatory Visit: Payer: Self-pay

## 2021-07-04 DIAGNOSIS — Z510 Encounter for antineoplastic radiation therapy: Secondary | ICD-10-CM | POA: Diagnosis not present

## 2021-07-04 DIAGNOSIS — C3432 Malignant neoplasm of lower lobe, left bronchus or lung: Secondary | ICD-10-CM | POA: Diagnosis not present

## 2021-07-04 LAB — RAD ONC ARIA SESSION SUMMARY
Course Elapsed Days: 13
Plan Fractions Treated to Date: 1
Plan Prescribed Dose Per Fraction: 12 Gy
Plan Total Fractions Prescribed: 2
Plan Total Prescribed Dose: 24 Gy
Reference Point Dosage Given to Date: 48 Gy
Reference Point Session Dosage Given: 12 Gy
Session Number: 4

## 2021-07-05 ENCOUNTER — Ambulatory Visit: Payer: Medicare HMO | Admitting: Radiation Oncology

## 2021-07-06 ENCOUNTER — Other Ambulatory Visit: Payer: Self-pay

## 2021-07-06 ENCOUNTER — Ambulatory Visit
Admission: RE | Admit: 2021-07-06 | Discharge: 2021-07-06 | Disposition: A | Discharge status: Home / Self Care | Payer: Medicare HMO | Source: Ambulatory Visit | Attending: Radiation Oncology | Admitting: Radiation Oncology

## 2021-07-06 DIAGNOSIS — C3432 Malignant neoplasm of lower lobe, left bronchus or lung: Secondary | ICD-10-CM | POA: Diagnosis not present

## 2021-07-06 DIAGNOSIS — Z510 Encounter for antineoplastic radiation therapy: Secondary | ICD-10-CM | POA: Diagnosis not present

## 2021-07-06 LAB — RAD ONC ARIA SESSION SUMMARY
Course Elapsed Days: 15
Plan Fractions Treated to Date: 2
Plan Prescribed Dose Per Fraction: 12 Gy
Plan Total Fractions Prescribed: 2
Plan Total Prescribed Dose: 24 Gy
Reference Point Dosage Given to Date: 60 Gy
Reference Point Session Dosage Given: 12 Gy
Session Number: 5

## 2021-07-06 IMAGING — PT NM PET TUM IMG INITIAL (PI) SKULL BASE T - THIGH
1 of 7 series · 1 of 25 positions shown · non-contrast
Comparison: neck CT 01/28/2020 CT 09/20/2010

CLINICAL DATA: Initial treatment strategy for oropharyngeal
carcinoma.

EXAM:
NUCLEAR MEDICINE PET SKULL BASE TO THIGH
TECHNIQUE: 10.4 mCi F-18 FDG was injected intravenously. Full-ring PET imaging
was performed from the skull base to thigh after the radiotracer. CT
data was obtained and used for attenuation correction and anatomic
localization.
Fasting blood glucose: 4 170 mg/dl

[Series 4: ct hn_sk_th 5.0 bf37 · axial · 5.0mm · 0.98mm/px · 1 of 252 slices shown]
[im 189/252  brain]
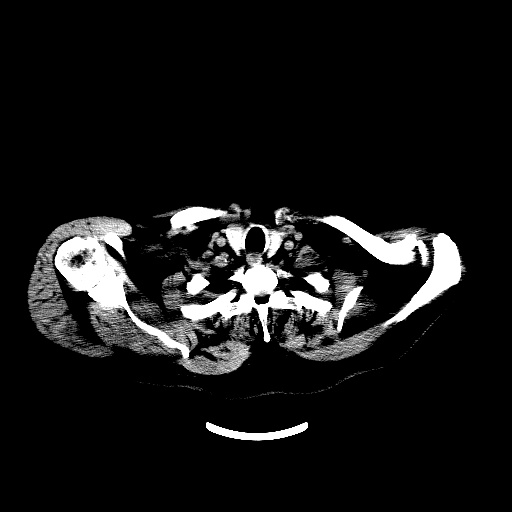

[1 of 25 positions shown; findings below may reference images not displayed]

FINDINGS: Mediastinal blood pool activity: SUV max

Liver activity: SUV max

NECK: Intense hypermetabolic activity localizes to the RIGHT base of
tongue and extends into the RIGHT lingual tonsil. Activity is
confined to the mucosal layer. Activity is intense with SUV max
equal 10.3 (image 48).

Bilateral large bulky intensely hypermetabolic level II lymph nodes
(SUV max equal 14). Nodal mass on the RIGHT measures 4.4 cm and on
the LEFT 4.8 cm.

Inferior to the dominant masses are there several additional small
hypermetabolic level II lymph nodes on the LEFT and RIGHT at the
level the hyoid. For example 8 mm LEFT inferior level II node (image
51) with SUV max equal 8.9.

No more inferior hypermetabolic nodes are identified. No
supraclavicular hypermetabolic nodes.

Incidental CT findings: Remote infarction noted in the RIGHT
parietal lobe.

CHEST: No hypermetabolic mediastinal or hilar nodes.

Two small pulmonary nodules in the inferior RIGHT middle lobe
measure 3 mm and 4 mm on image 102 and 104 series 4. The small
pulmonary nodules do not have radiotracer activity. Additionally
nodules are present on CT from 09/20/2010.

Incidental CT findings: Coronary artery calcification and aortic
atherosclerotic calcification.

ABDOMEN/PELVIS: No abnormal hypermetabolic activity within the
liver, pancreas, adrenal glands, or spleen. No hypermetabolic lymph
nodes in the abdomen or pelvis.

Incidental CT findings: none

SKELETON: No focal hypermetabolic activity to suggest skeletal
metastasis.

Incidental CT findings: Degenerative osteophytosis of the spine.
IMPRESSION: 1. Hypermetabolic lesion at the RIGHT base of tongue and RIGHT
tonsil consistent with oropharyngeal carcinoma. Carcinoma appears
confined to the mucosal surface.
2. Bulky bilateral hypermetabolic level II metastatic lymph nodes.
Smaller inferior hypermetabolic level II metastatic lymph nodes.
3. No evidence of metastatic disease in the chest, abdomen, or
pelvis.
4. Two small RIGHT middle lobe pulmonary nodules are present on CT
from 7357 consistent with benign etiology

## 2021-07-11 ENCOUNTER — Telehealth: Payer: Self-pay | Admitting: Pharmacist

## 2021-07-11 NOTE — Progress Notes (Signed)
Chronic Care Management Pharmacy Assistant   Name: Brian [name]  MRN: [id number] DOB: Feb 22, 1955   Reason for Encounter: Disease State - Hypertension Call      Recent office visits:  06/01/21 Jenna Luo MD - Family Medicine - [facility] - Labs were ordered. Follow up as scheduled.   04/10/21 Jenna Luo, MD - Family Medicine - [place] - Labs were ordered. Referral to ENT was placed. No medication changes were noted. Follow up as scheduled.   03/30/21 Annual Medicare Wellness Completed  03/27/21 Jenna Luo, MD - Family Medicine - [place] - Labs were ordered. Increase Coumadin to 5 mg on Tuesday, Thursday, Saturday, Sunday and 10 mg on Monday, Wednesday, Friday and recheck INR in 2 weeks.  Recent consult visits:  None noted.   Hospital visits:  None in previous 6 months  Medications: Outpatient Encounter Medications as of 07/11/2021  Medication Sig   atorvastatin (LIPITOR) 20 MG tablet TAKE 1 TABLET BY MOUTH EVERY DAY   Blood Glucose Monitoring Suppl (ONETOUCH VERIO IQ SYSTEM) w/Device KIT 1 each by Does not apply route 2 (two) times daily.   carvedilol (COREG) 25 MG tablet Take 1 tablet (25 mg total) by mouth 2 (two) times daily.   Continuous Blood Gluc Receiver (FREESTYLE LIBRE 2 READER) DEVI Use as directed to monitor blood glucose continuously. DX E11.65   Continuous Blood Gluc Sensor (FREESTYLE LIBRE 2 SENSOR) MISC Use as directed to monitor blood glucose continuously. Change sensor Q 14 days. DX E11.65   Dulaglutide (TRULICITY) 2.20 UR/4.2HC SOPN Inject 0.75 mg into the skin once a week.   glucose blood test strip 1 each by Other route as needed for other. Use as instructed   hydrALAZINE (APRESOLINE) 50 MG tablet TAKE 1 TABLET BY MOUTH THREE TIMES A DAY   Insulin Pen Needle (B-D UF III MINI PEN NEEDLES) 31G X 5 MM MISC USE DAILY WITH INSULIN   Insulin Syringe-Needle U-100 (INSULIN SYRINGE .3CC/31GX5/16") 31G X 5/16" 0.3 ML MISC Use twice daily to inject insulin.   losartan  (COZAAR) 100 MG tablet Take 1 tablet (100 mg total) by mouth daily.   magnesium oxide (MAG-OX) 400 MG tablet Take 400 mg by mouth daily.   OneTouch Delica Lancets 62B MISC 1 each by Does not apply route 2 (two) times daily.   pioglitazone (ACTOS) 30 MG tablet Take 1 tablet (30 mg total) by mouth daily.   warfarin (COUMADIN) 10 MG tablet TAKE 1 TABLET BY MOUTH EVERY DAY   No facility-administered encounter medications on file as of 07/11/2021.   Current antihypertensive regimen:  Amlodipine 57m daily Carvedilol 243mBID Chlorthalidone 2519maily Clonidine 0.1mg52mice daily Hydralazine 50mg28m Losartan 50mg 37my  How often are you checking your Blood Pressure?     Current home BP readings:    What recent interventions/DTPs have been made by any provider to improve Blood Pressure control since last CPP Visit:     Any recent hospitalizations or ED visits since last visit with CPP?  Patient has not had any hospitalizations or ED visits since last visit with CPP.    What diet changes have been made to improve Blood Pressure Control?     What exercise is being done to improve your Blood Pressure Control?      Adherence Review: Is the patient currently on ACE/ARB medication? Yes Does the patient have >5 day gap between last estimated fill dates? No   Care Gaps   AWV: done 03/30/21 Colonoscopy: done 08/12/18  DM Eye Exam: due 06/21/21 DM Foot Exam: due 02/17/20 Microalbumin: done 02/17/19 HbgAIC: done 06/01/21 (7.5) DEXA:  N/A Mammogram: N/A   Star Rating Drugs: Atorvastatin (LIPITOR) 20 MG tablet - last filled 06/08/21 90 days  Dulaglutide (TRULICITY) 8.13 GI/7.1LL SOPN - last filled 05/27/21 28 days  Losartan (COZAAR) 100 MG tablet - last filled 03/30/21 90 days  Pioglitazone (ACTOS) 15 MG tablet - last filled 06/19/21 90 days     Future Appointments  Date Time Provider [place]  07/13/2021  9:00 AM [name], MD [facility]  08/09/2021 11:00 AM  [name], MD [facility] None  09/18/2021 11:15 AM Gardenia Phlegm, NP [facility] None  03/20/2022  9:30 AM [name], MD [facility] None  04/05/2022  9:00 AM [facility]-NURSE HEALTH ADVISOR [facility]   Multiple attempts were made to contact patient. Attempts were unsuccessful. / ls,CMA   Jobe Gibbon, [place] Pharmacist Assistant  [phone]

## 2021-07-12 ENCOUNTER — Other Ambulatory Visit: Payer: Self-pay | Admitting: Family Medicine

## 2021-07-12 DIAGNOSIS — E119 Type 2 diabetes mellitus without complications: Secondary | ICD-10-CM

## 2021-07-12 DIAGNOSIS — E669 Obesity, unspecified: Secondary | ICD-10-CM

## 2021-07-13 ENCOUNTER — Ambulatory Visit (INDEPENDENT_AMBULATORY_CARE_PROVIDER_SITE_OTHER): Payer: Medicare HMO | Admitting: Family Medicine

## 2021-07-13 VITALS — BP 130/84 | HR 75 | Temp 98.0°F | Ht 69.0 in | Wt 189.0 lb

## 2021-07-13 DIAGNOSIS — I4891 Unspecified atrial fibrillation: Secondary | ICD-10-CM | POA: Diagnosis not present

## 2021-07-13 LAB — PT WITH INR/FINGERSTICK
INR, fingerstick: 2.4 ratio — ABNORMAL HIGH
PT, fingerstick: 28.3 s — ABNORMAL HIGH (ref 10.5–13.1)

## 2021-07-13 NOTE — Progress Notes (Signed)
Subjective:    Patient ID: Brian [name], male    DOB: 07/02/1955, 66 y.o.   MRN: [id number]  Here today to recheck his INR.  INR is therapeutic at 2.4.  He is currently taking Coumadin 10 mg on Monday, Wednesday, Friday, Saturday and 5 mg on Tuesday, Thursday, Sunday.   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 Zigmund Daniel, laser therapy OD   Seizures ([place])    pt reports this is from low blood sugar    Past Surgical History:  Procedure Laterality Date   COLONOSCOPY     DIRECT LARYNGOSCOPY N/A 02/10/2020   Procedure: DIRECT LARYNGOSCOPY WITH BIOPSY;  Surgeon: [name], MD;  Location: [place];  Service: ENT;  Laterality: N/A;   Ear surgery as a child     IR GASTROSTOMY TUBE MOD SED  03/23/2020   IR GASTROSTOMY TUBE REMOVAL  06/01/2020   IR IMAGING GUIDED PORT INSERTION  03/09/2020   IR REMOVAL TUN ACCESS W/ PORT W/O FL MOD SED  02/08/2021   TONSILLECTOMY     Current Outpatient Medications on File Prior to Visit  Medication Sig Dispense Refill   atorvastatin (LIPITOR) 20 MG tablet TAKE 1 TABLET BY MOUTH EVERY DAY 90 tablet 3   Blood Glucose Monitoring Suppl (ONETOUCH VERIO IQ SYSTEM) w/Device KIT 1 each by Does not apply route 2 (two) times daily. 1 kit 0   Continuous Blood Gluc Receiver (FREESTYLE LIBRE 2 READER) DEVI Use as directed to monitor blood glucose continuously. DX E11.65 1 each 1   Continuous Blood Gluc Sensor (FREESTYLE LIBRE 2 SENSOR) MISC Use as directed to monitor blood glucose continuously. Change sensor Q 14 days. DX E11.65 2 each 11   Dulaglutide (TRULICITY) 2.09 OB/0.9GG SOPN Inject 0.75 mg into the skin once a week. 2 mL 3   glucose blood test strip 1 each by Other route as needed for  other. Use as instructed 100 each 11   hydrALAZINE (APRESOLINE) 50 MG tablet TAKE 1 TABLET BY MOUTH THREE TIMES A DAY 270 tablet 1   Insulin Pen Needle (B-D UF III MINI PEN NEEDLES) 31G X 5 MM MISC USE DAILY WITH INSULIN 100 each 5   Insulin Syringe-Needle U-100 (INSULIN SYRINGE .3CC/31GX5/16") 31G X 5/16" 0.3 ML MISC Use twice daily to inject insulin. 100 each 10   losartan (COZAAR) 100 MG tablet Take 1 tablet (100 mg total) by mouth daily. 90 tablet 0   magnesium oxide (MAG-OX) 400 MG tablet Take 400 mg by mouth daily.     OneTouch Delica Lancets 83M MISC 1 each by Does not apply route 2 (two) times daily. 200 each 11   pioglitazone (ACTOS) 30 MG tablet Take 1 tablet (30 mg total) by mouth daily. 90 tablet 1   warfarin (COUMADIN) 10 MG tablet TAKE 1 TABLET BY MOUTH EVERY DAY 90 tablet 3   carvedilol (COREG) 25 MG tablet Take 1 tablet (25 mg total) by mouth 2 (two) times daily. 180 tablet 3   No current facility-administered medications on file prior to visit.     Allergies  Allergen Reactions   Codeine Other (See  Comments)    Unknown reaction, severe headach   Metformin And Related Diarrhea   Social History   Socioeconomic History   Marital status: Divorced    Spouse name: Not on file   Number of children: 2   Years of education: Not on file   Highest education level: Not on file  Occupational History   Occupation: Disabled  Tobacco Use   Smoking status: Never   Smokeless tobacco: Never  Vaping Use   Vaping Use: Never used  Substance and Sexual Activity   Alcohol use: Yes    Alcohol/week: 0.0 standard drinks    Comment: seldom   Drug use: Yes    Frequency: 7.0 times per week    Comment: marijauna for arthritis, last 03-13-20   Sexual activity: Not Currently  Other Topics Concern   Not on file  Social History Narrative   Two children.  5 grandchildren.     Social Determinants of Health   Financial Resource Strain: Low Risk    Difficulty of Paying Living Expenses:  Not hard at all  Food Insecurity: No Food Insecurity   Worried About Charity fundraiser in the Last Year: Never true   [place] in the Last Year: Never true  Transportation Needs: No Transportation Needs   Lack of Transportation (Medical): No   Lack of Transportation (Non-Medical): No  Physical Activity: Sufficiently Active   Days of Exercise per Week: 5 days   Minutes of Exercise per Session: 30 min  Stress: No Stress Concern Present   Feeling of Stress : Not at all  Social Connections: Socially Isolated   Frequency of Communication with Friends and Family: More than three times a week   Frequency of Social Gatherings with Friends and Family: More than three times a week   Attends Religious Services: Never   Marine scientist or Organizations: No   Attends Archivist Meetings: Never   Marital Status: Divorced  Human resources officer Violence: Not At Risk   Fear of Current or Ex-Partner: No   Emotionally Abused: No   Physically Abused: No   Sexually Abused: No    Review of Systems  All other systems reviewed and are negative.     Objective:   Physical Exam Vitals reviewed.  Cardiovascular:     Rate and Rhythm: Normal rate and regular rhythm.     Heart sounds: Normal heart sounds.  Pulmonary:     Effort: Pulmonary effort is normal. No respiratory distress.     Breath sounds: Normal breath sounds. No wheezing or rales.  Chest:     Chest wall: No tenderness.  Abdominal:     General: Bowel sounds are normal. There is no distension.     Palpations: Abdomen is soft.     Tenderness: There is no abdominal tenderness. There is no rebound.  Musculoskeletal:     Right lower leg: No edema.     Left lower leg: No edema.       Assessment & Plan:  Atrial fibrillation, unspecified type ([place]) - Plan: PT with INR/Fingerstick INR is therapeutic.  Recheck INR in 8 weeks.  Recheck A1c in August.  Continue Coumadin 10 mg on Monday, Wednesday, Friday, Saturday and 5 mg  on Tuesday, Thursday, Sunday.

## 2021-07-14 NOTE — Telephone Encounter (Signed)
Requested Prescriptions  Pending Prescriptions Disp Refills  . hydrALAZINE (APRESOLINE) 50 MG tablet [Pharmacy Med Name: HYDRALAZINE 50 MG TABLET] 270 tablet 1    Sig: TAKE 1 TABLET BY MOUTH THREE TIMES A DAY     Cardiovascular:  Vasodilators Failed - 07/12/2021  5:27 PM      Failed - HCT in normal range and within 360 days    HCT  Date Value Ref Range Status  06/01/2021 31.8 (L) 38.5 - 50.0 % Final         Failed - HGB in normal range and within 360 days    Hemoglobin  Date Value Ref Range Status  06/01/2021 10.0 (L) 13.2 - 17.1 g/dL Final  09/16/2020 11.9 (L) 13.0 - 17.0 g/dL Final         Failed - RBC in normal range and within 360 days    RBC  Date Value Ref Range Status  06/01/2021 3.69 (L) 4.20 - 5.80 Million/uL Final         Failed - ANA Screen, Ifa, Serum in normal range and within 360 days    No results found for: ANA, ANATITER, LABANTI       Passed - WBC in normal range and within 360 days    WBC  Date Value Ref Range Status  06/01/2021 4.2 3.8 - 10.8 Thousand/uL Final         Passed - PLT in normal range and within 360 days    Platelets  Date Value Ref Range Status  06/01/2021 211 140 - 400 Thousand/uL Final   Platelet Count  Date Value Ref Range Status  09/16/2020 161 150 - 400 K/uL Final         Passed - Last BP in normal range    BP Readings from Last 1 Encounters:  07/13/21 130/84         Passed - Valid encounter within last 12 months    Recent Outpatient Visits          Yesterday Atrial fibrillation, unspecified type ([place])   [place] [name], MD   1 month ago Atrial fibrillation, unspecified type ([place])   [place] [name], MD   3 months ago Atrial fibrillation, unspecified type ([place])   [place] Pickard, Cammie Mcgee, MD   3 months ago Atrial fibrillation, unspecified type ([place])   [place] Pickard, Cammie Mcgee, MD   4 months ago Atrial fibrillation,  unspecified type ([place])   [facility] Pickard, Cammie Mcgee, MD             . pioglitazone (ACTOS) 15 MG tablet [Pharmacy Med Name: PIOGLITAZONE HCL 15 MG TABLET] 90 tablet     Sig: TAKE 1 TABLET BY MOUTH EVERY DAY     Endocrinology:  Diabetes - Glitazones - pioglitazone Passed - 07/12/2021  5:27 PM      Passed - HBA1C is between 0 and 7.9 and within 180 days    Hgb A1c MFr Bld  Date Value Ref Range Status  06/01/2021 7.5 (H) <5.7 % of total Hgb Final    Comment:    For someone without known diabetes, a hemoglobin A1c value of 6.5% or greater indicates that they may have  diabetes and this should be confirmed with a follow-up  test. . For someone with known diabetes, a value <7% indicates  that their diabetes is well controlled and a value  greater than or equal  to 7% indicates suboptimal  control. A1c targets should be individualized based on  duration of diabetes, age, comorbid conditions, and  other considerations. . Currently, no consensus exists regarding use of hemoglobin A1c for diagnosis of diabetes for children. Renella Cunas - Valid encounter within last 6 months    Recent Outpatient Visits          Yesterday Atrial fibrillation, unspecified type ([place])   [place] Pickard, Cammie Mcgee, MD   1 month ago Atrial fibrillation, unspecified type [facility])   [place] [name], MD   3 months ago Atrial fibrillation, unspecified type [facility])   [place] [name], MD   3 months ago Atrial fibrillation, unspecified type [facility])   [place] [name], MD   4 months ago Atrial fibrillation, unspecified type [facility])   [facility] Pickard, Cammie Mcgee, MD

## 2021-08-01 ENCOUNTER — Telehealth: Payer: Self-pay | Admitting: Pharmacist

## 2021-08-01 NOTE — Progress Notes (Signed)
Chronic Care Management Pharmacy Assistant   Name: Brian [name]  MRN: [id number] DOB: 04-Jun-1955   Reason for Encounter: Disease State - Hypertension Call    Recent office visits:  07/13/21 Jenna Luo MD - Family Medicine - Afib - INR ordered. Follow up as scheduled.   Recent consult visits:  None noted.   Hospital visits:  None in previous 6 months  Medications: Outpatient Encounter Medications as of 08/01/2021  Medication Sig   atorvastatin (LIPITOR) 20 MG tablet TAKE 1 TABLET BY MOUTH EVERY DAY   Blood Glucose Monitoring Suppl (ONETOUCH VERIO IQ SYSTEM) w/Device KIT 1 each by Does not apply route 2 (two) times daily.   carvedilol (COREG) 25 MG tablet Take 1 tablet (25 mg total) by mouth 2 (two) times daily.   Continuous Blood Gluc Receiver (FREESTYLE LIBRE 2 READER) DEVI Use as directed to monitor blood glucose continuously. DX E11.65   Continuous Blood Gluc Sensor (FREESTYLE LIBRE 2 SENSOR) MISC Use as directed to monitor blood glucose continuously. Change sensor Q 14 days. DX E11.65   Dulaglutide (TRULICITY) 0.38 BF/3.8VA SOPN Inject 0.75 mg into the skin once a week.   glucose blood test strip 1 each by Other route as needed for other. Use as instructed   hydrALAZINE (APRESOLINE) 50 MG tablet TAKE 1 TABLET BY MOUTH THREE TIMES A DAY   Insulin Pen Needle (B-D UF III MINI PEN NEEDLES) 31G X 5 MM MISC USE DAILY WITH INSULIN   Insulin Syringe-Needle U-100 (INSULIN SYRINGE .3CC/31GX5/16") 31G X 5/16" 0.3 ML MISC Use twice daily to inject insulin.   losartan (COZAAR) 100 MG tablet Take 1 tablet (100 mg total) by mouth daily.   magnesium oxide (MAG-OX) 400 MG tablet Take 400 mg by mouth daily.   OneTouch Delica Lancets 91B MISC 1 each by Does not apply route 2 (two) times daily.   pioglitazone (ACTOS) 30 MG tablet Take 1 tablet (30 mg total) by mouth daily.   warfarin (COUMADIN) 10 MG tablet TAKE 1 TABLET BY MOUTH EVERY DAY   No facility-administered encounter  medications on file as of 08/01/2021.    Current antihypertensive regimen:  Amlodipine 82m daily Carvedilol 260mBID Chlorthalidone 2531maily Clonidine 0.1mg67mice daily Hydralazine 50mg59m Losartan 50mg 51my  How often are you checking your Blood Pressure?     Current home BP readings:    What recent interventions/DTPs have been made by any provider to improve Blood Pressure control since last CPP Visit:     Any recent hospitalizations or ED visits since last visit with CPP?  Patient has not had any hospitalizations or ED visits since last visit with CPP.    What diet changes have been made to improve Blood Pressure Control?     What exercise is being done to improve your Blood Pressure Control?      Adherence Review: Is the patient currently on ACE/ARB medication? Yes Does the patient have >5 day gap between last estimated fill dates? No   Care Gaps   AWV: done 03/30/21 Colonoscopy: done 08/12/18 DM Eye Exam: due 06/21/21 DM Foot Exam: due 02/17/20 Microalbumin: done 02/17/19 HbgAIC: done 06/01/21 (7.5) DEXA:  N/A Mammogram: N/A   Star Rating Drugs: Atorvastatin (LIPITOR) 20 MG tablet - last filled 06/08/21 90 days  Dulaglutide (TRULICITY) 0.75 M1.665MA/0.0KH- last filled 07/16/21 28 days  Losartan (COZAAR) 100 MG tablet - last filled 07/12/21 90 days  Pioglitazone (ACTOS) 15 MG tablet - last filled 06/19/21 90 days  Future Appointments  Date Time Provider [place]  08/09/2021 11:00 AM [name], MD [facility] None  08/24/2021 10:15 AM [name], MD [facility]  09/18/2021 11:15 AM [place] Phlegm, NP [facility] None  03/20/2022  9:30 AM [name], MD [facility] None  04/05/2022  9:00 AM [facility]-NURSE HEALTH ADVISOR [facility]   Multiple attempts were made to contact patient. Attempts were unsuccessful. / ls,CMA   Jobe Gibbon, [place] Pharmacist Assistant  [phone]

## 2021-08-09 ENCOUNTER — Encounter: Payer: Self-pay | Admitting: Radiation Oncology

## 2021-08-09 ENCOUNTER — Ambulatory Visit
Admission: RE | Admit: 2021-08-09 | Discharge: 2021-08-09 | Disposition: A | Discharge status: Home / Self Care | Payer: Medicare HMO | Source: Ambulatory Visit | Attending: Radiation Oncology | Admitting: Radiation Oncology

## 2021-08-09 VITALS — BP 127/92 | HR 120 | Temp 97.6°F | Wt 197.0 lb

## 2021-08-09 DIAGNOSIS — C01 Malignant neoplasm of base of tongue: Secondary | ICD-10-CM | POA: Insufficient documentation

## 2021-08-09 DIAGNOSIS — C3432 Malignant neoplasm of lower lobe, left bronchus or lung: Secondary | ICD-10-CM | POA: Insufficient documentation

## 2021-08-09 NOTE — Progress Notes (Signed)
  Radiation Oncology         [phone]) [phone] ________________________________  Name: Brian [name] MRN: [id number]  Date: 07/06/2021  DOB: 1955/11/22  End of Treatment Note  Diagnosis:    Left lower lung mass suspicious for primary cancer versus oligometastatic disease from head and neck cancer  Indication for treatment: Curative       Radiation treatment dates:   06/21/2021 through 07/06/2021  Site/dose:   Left lower lung mass, 60 Gray in 5 fractions  Beams/energy:   VMAT SBRT/6FFF photons  Narrative: The patient tolerated radiation treatment relatively well.     Plan: The patient has completed radiation treatment. The patient will return to radiation oncology clinic for routine followup in one month. I advised them to call or return sooner if they have any questions or concerns related to their recovery or treatment.  -----------------------------------  [name], MD

## 2021-08-09 NOTE — Progress Notes (Addendum)
Radiation Oncology         [phone] ________________________________  Name: Brian [name] MRN: [id number]  Date: 08/09/2021  DOB: 1956/02/14  Follow-Up Visit Note  CC: Brian Frizzle, MD  Brian Frizzle, MD  Diagnosis and Prior Radiotherapy:       ICD-10-CM   1. 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 Malignant neoplasm of base of tongue ([place])  C01        Cancer Staging  Malignant neoplasm of base of tongue ([place]) Staging form: Pharynx - HPV-Mediated Oropharynx, AJCC 8th Edition - Clinical stage from 03/01/2020: Stage III (cT2, cN3, cM0, p16+) - Signed by Brian Gibson, MD on 03/02/2020 Stage prefix: Initial diagnosis  Radiation Treatment Dates: 03/14/2020 through 05/04/2020 Site Technique Total Dose (Gy) Dose per Fx (Gy) Completed Fx Beam Energies  Neck: HN_BOT IMRT 70/70 2 35/35 6X   From 06/21/2021 to 07/06/2021 he received 60 Gray in 5 fractions, stereotactic body radiation therapy, to left lower lobe lung mass  CHIEF COMPLAINT:  Here for follow-up and surveillance of throat cancer Brian [name] presents today for follow-up after completing SBRT to his left lung on 07/06/2021 (also completed and radiation to his base of tongue on 05/04/2020)   Pain: Patient denies Respiratory concerns: Reports an occasional productive cough (clear sputum). Reports shortness of breath with significant activity (has audible wheezing today at rest) Appetite: Reports a healthy appetite, but continues to struggle with stringy foods (ex. Brian [name] fries, certain vegetables, and stringy meats) Wt Readings from Last 3 Encounters:  08/09/21 197 lb (89.4 kg)  07/13/21 189 lb (85.7 kg)  06/12/21 184 lb 8 oz (83.7 kg)   Fatigue: Patient denies MedOnc F/U: Scheduled for H&N survivorship on 09/18/2021 and to see his medical oncologist Dr. Chryl [name] in 02/2022 Other issues of note: Reports dry/flaky skin to his neck despite using lotion. Noticeable lymphedema to neck/under chin.  Scheduled for F/U with his PCP Dr. Dennard [name] 08/29/21 Narrative:      ALLERGIES:  is allergic to codeine and metformin and related.  Meds: Current Outpatient Medications  Medication Sig Dispense Refill   atorvastatin (LIPITOR) 20 MG tablet TAKE 1 TABLET BY MOUTH EVERY DAY 90 tablet 3   Blood Glucose Monitoring Suppl (ONETOUCH VERIO IQ SYSTEM) w/Device KIT 1 each by Does not apply route 2 (two) times daily. 1 kit 0   carvedilol (COREG) 25 MG tablet Take 1 tablet (25 mg total) by mouth 2 (two) times daily. 180 tablet 3   Continuous Blood Gluc Receiver (FREESTYLE LIBRE 2 READER) DEVI Use as directed to monitor blood glucose continuously. DX E11.65 1 each 1   Continuous Blood Gluc Sensor (FREESTYLE LIBRE 2 SENSOR) MISC Use as directed to monitor blood glucose continuously. Change sensor Q 14 days. DX E11.65 2 each 11   Dulaglutide (TRULICITY) 9.35 TS/1.7BL SOPN Inject 0.75 mg into the skin once a week. 2 mL 3   glucose blood test strip 1 each by Other route as needed for other. Use as instructed 100 each 11   hydrALAZINE (APRESOLINE) 50 MG tablet TAKE 1 TABLET BY MOUTH THREE TIMES A DAY 270 tablet 1   Insulin Pen Needle (B-D UF III MINI PEN NEEDLES) 31G X 5 MM MISC USE DAILY WITH INSULIN 100 each 5   Insulin Syringe-Needle U-100 (INSULIN SYRINGE .3CC/31GX5/16") 31G X 5/16" 0.3 ML MISC Use twice daily to inject insulin. 100 each 10   losartan (COZAAR) 100 MG tablet Take  1 tablet (100 mg total) by mouth daily. 90 tablet 0   magnesium oxide (MAG-OX) 400 MG tablet Take 400 mg by mouth daily.     OneTouch Delica Lancets 59Y MISC 1 each by Does not apply route 2 (two) times daily. 200 each 11   pioglitazone (ACTOS) 30 MG tablet Take 1 tablet (30 mg total) by mouth daily. 90 tablet 1   warfarin (COUMADIN) 10 MG tablet TAKE 1 TABLET BY MOUTH EVERY DAY 90 tablet 3   No current facility-administered medications for this encounter.    Physical Findings: The patient is in no acute distress. Patient is alert  and oriented. Wt Readings from Last 3 Encounters:  08/09/21 197 lb (89.4 kg)  07/13/21 189 lb (85.7 kg)  06/12/21 184 lb 8 oz (83.7 kg)    weight is 197 lb (89.4 kg). His temperature is 97.6 F (36.4 C). His blood pressure is 127/92 (abnormal) and his pulse is 120 (abnormal). His oxygen saturation is 100%. .  General: Alert and oriented, in no acute distress HEENT: Head is normocephalic. Extraocular movements are intact. Oropharynx is notable for no thrush,  no tumor.  He is hard of hearing.    Neck: Significant anterior lymphedema, no obvious adenopathy to deep palpation. Skin: neck skin intact and satisfactorily healed Psychiatric: Pleasant to speak with. Affect is appropriate. Heart irregularly irregular rhythm with tachycardic rate Chest CTAB   Extremities: Pitting edema in the ankles bilaterally.  He has edema in the left forearm wrist and hand with contracture of his third through fifth digits  Lab Findings: Lab Results  Component Value Date   WBC 4.2 06/01/2021   HGB 10.0 (L) 06/01/2021   HCT 31.8 (L) 06/01/2021   MCV 86.2 06/01/2021   PLT 211 06/01/2021    Lab Results  Component Value Date   TSH 1.583 09/16/2020    Radiographic Findings: No results found.   Impression/Plan:    1) Head and Neck Cancer Status: Symptomatically he is doing well after SBRT to the left lower lung mass.  However he does have some other issues at this time.  He has swelling in his left upper extremity which is new and we will order a Doppler ultrasound to be done as soon as it is feasible for the patient.  He states that he probably cannot get this done until Friday due to other circumstances.    Also the patient is tachycardic with known atrial fibrillation.  He has not seen his cardiologist in several months and I do not see any upcoming appointment so I contacted his cardiologist as well as his PCP about this issue.  The patient knows to follow-up with either of them to optimize his  medications and treatments for his cardio vascular health.  Regarding surveillance of his head and neck cancer we will have him return to me in 2 months with restaging CT of neck and chest without contrast at that time  2) Nutritional Status:  tolerating oral intake, PEG removed Wt Readings from Last 3 Encounters:  08/09/21 197 lb (89.4 kg)  07/13/21 189 lb (85.7 kg)  06/12/21 184 lb 8 oz (83.7 kg)     3)   Swallowing:  continue speech-language pathology exercises  4) Dental: Encouraged to continue regular followup with dentistry, and dental hygiene including fluoride products.   (Patient is not compliant with this.  He seems overwhelmed when I talk to him about this today so it should be revisited in survivorship clinic)  5)  Thyroid function: Check annually in med onc Lab Results  Component Value Date   TSH 1.583 09/16/2020    6) Other: Continue PT at home for neck edema.(He states he is not compliant with this.  His lymphedema is substantial.  I offered to refer him back to physical therapy.  He states he has lost his compression device.  He adamantly declines this at this time)     On date of service, in total, I spent 35 minutes on this encounter. Patient was seen in person. _____________________________________   Brian Gibson, MD

## 2021-08-09 NOTE — Progress Notes (Signed)
[name] presents today for follow-up after completing SBRT to his left lung on 07/06/2021 (also completed and radiation to his base of tongue on 05/04/2020)   Pain: Patient denies Respiratory concerns: Reports an occasional productive cough (clear sputum). Reports shortness of breath with significant activity (has audible wheezing today at rest) Appetite: Reports a healthy appetite, but continues to struggle with stringy foods (ex. [place] fries, certain vegetables, and stringy meats) Wt Readings from Last 3 Encounters:  08/09/21 197 lb (89.4 kg)  07/13/21 189 lb (85.7 kg)  06/12/21 184 lb 8 oz (83.7 kg)   Fatigue: Patient denies MedOnc F/U: Scheduled for H&N survivorship on 09/18/2021 and to see his medical oncologist Dr. Chryl Heck in 02/2022 Other issues of note: Reports dry/flaky skin to his neck despite using lotion. Noticeable lymphedema to neck/under chin. Scheduled for F/U with his PCP Dr. Dennard Schaumann 08/29/21

## 2021-08-10 ENCOUNTER — Encounter: Payer: Self-pay | Admitting: *Deleted

## 2021-08-10 ENCOUNTER — Other Ambulatory Visit: Payer: Self-pay

## 2021-08-10 DIAGNOSIS — C01 Malignant neoplasm of base of tongue: Secondary | ICD-10-CM

## 2021-08-10 DIAGNOSIS — R600 Localized edema: Secondary | ICD-10-CM | POA: Diagnosis not present

## 2021-08-10 DIAGNOSIS — I129 Hypertensive chronic kidney disease with stage 1 through stage 4 chronic kidney disease, or unspecified chronic kidney disease: Secondary | ICD-10-CM | POA: Diagnosis not present

## 2021-08-10 DIAGNOSIS — I4891 Unspecified atrial fibrillation: Secondary | ICD-10-CM | POA: Diagnosis not present

## 2021-08-10 DIAGNOSIS — I639 Cerebral infarction, unspecified: Secondary | ICD-10-CM | POA: Diagnosis not present

## 2021-08-10 DIAGNOSIS — I2510 Atherosclerotic heart disease of native coronary artery without angina pectoris: Secondary | ICD-10-CM | POA: Diagnosis not present

## 2021-08-10 DIAGNOSIS — E785 Hyperlipidemia, unspecified: Secondary | ICD-10-CM | POA: Diagnosis not present

## 2021-08-10 DIAGNOSIS — R911 Solitary pulmonary nodule: Secondary | ICD-10-CM

## 2021-08-10 DIAGNOSIS — C3432 Malignant neoplasm of lower lobe, left bronchus or lung: Secondary | ICD-10-CM

## 2021-08-10 DIAGNOSIS — N1832 Chronic kidney disease, stage 3b: Secondary | ICD-10-CM | POA: Diagnosis not present

## 2021-08-10 DIAGNOSIS — D509 Iron deficiency anemia, unspecified: Secondary | ICD-10-CM | POA: Diagnosis not present

## 2021-08-10 DIAGNOSIS — E1122 Type 2 diabetes mellitus with diabetic chronic kidney disease: Secondary | ICD-10-CM | POA: Diagnosis not present

## 2021-08-11 ENCOUNTER — Ambulatory Visit: Payer: Medicare HMO | Admitting: Family Medicine

## 2021-08-11 ENCOUNTER — Telehealth: Payer: Self-pay

## 2021-08-11 ENCOUNTER — Ambulatory Visit (HOSPITAL_COMMUNITY)
Admission: RE | Admit: 2021-08-11 | Discharge: 2021-08-11 | Disposition: A | Discharge status: Home / Self Care | Payer: Medicare HMO | Source: Ambulatory Visit | Attending: Radiation Oncology | Admitting: Radiation Oncology

## 2021-08-11 DIAGNOSIS — C3432 Malignant neoplasm of lower lobe, left bronchus or lung: Secondary | ICD-10-CM | POA: Diagnosis not present

## 2021-08-14 ENCOUNTER — Ambulatory Visit (INDEPENDENT_AMBULATORY_CARE_PROVIDER_SITE_OTHER): Payer: Medicare HMO | Admitting: Family Medicine

## 2021-08-14 VITALS — BP 130/92 | HR 81 | Temp 97.9°F | Ht 69.0 in | Wt 205.0 lb

## 2021-08-14 DIAGNOSIS — I4821 Permanent atrial fibrillation: Secondary | ICD-10-CM | POA: Diagnosis not present

## 2021-08-14 LAB — PT WITH INR/FINGERSTICK
INR, fingerstick: 4 ratio — ABNORMAL HIGH
PT, fingerstick: 48.4 s — ABNORMAL HIGH (ref 10.5–13.1)

## 2021-08-14 MED ORDER — DILTIAZEM HCL ER COATED BEADS 120 MG PO CP24: 120.0000 mg | ORAL_CAPSULE | Freq: Every day | ORAL | 1 refills | Status: DC

## 2021-08-15 ENCOUNTER — Other Ambulatory Visit: Payer: Self-pay

## 2021-08-17 ENCOUNTER — Encounter: Payer: Self-pay | Admitting: Cardiology

## 2021-08-17 ENCOUNTER — Ambulatory Visit: Payer: Medicare HMO | Admitting: Cardiology

## 2021-08-17 VITALS — BP 140/93 | HR 71 | Ht 69.0 in | Wt 200.6 lb

## 2021-08-17 DIAGNOSIS — Z79899 Other long term (current) drug therapy: Secondary | ICD-10-CM

## 2021-08-17 DIAGNOSIS — I4891 Unspecified atrial fibrillation: Secondary | ICD-10-CM | POA: Diagnosis not present

## 2021-08-17 MED ORDER — EMPAGLIFLOZIN 25 MG PO TABS: 25.0000 mg | ORAL_TABLET | Freq: Every day | ORAL | 3 refills | Status: DC

## 2021-08-17 MED ORDER — FUROSEMIDE 40 MG PO TABS
40.0000 mg | ORAL_TABLET | Freq: Every day | ORAL | 0 refills | Status: DC
Start: 2021-08-17 — End: 2021-09-14

## 2021-08-17 NOTE — Progress Notes (Signed)
Cardiology Office Note   Date:  08/17/2021   ID:  Brian [name], DOB 03/03/1955, MRN [id number]  PCP:  [name], MD  Cardiologist:   Minus Breeding, MD Referring:  [name], MD  Chief Complaint  Patient presents with   Atrial Fibrillation       History of Present Illness: Brian [name] is a 66 y.o. male who is referred for evaluation of syncope.  He has a history of atrial fibrillation and cardiomyopathy.  I see an ejection fraction from 2011 of 25 to 30%.  He had a CVA treated with tPA at that time.   He had difficult to control hypertension.  I saw him last in 2013.  At that time he was said to have an ejection fraction of 60% although I do not see this result.  He did have a stress perfusion study in 2012.    He was added to the schedule today because of atrial fibrillation demonstrating a rapid rate.  He saw [name], MD the other day and had Cardizem 120 mg added to his regimen.  He is on warfarin.    He presents today follow-up.  He does not feel his atrial fibrillation.  He does not feel any palpitations, presyncope or syncope.  He has had some increased lower extremity swelling.  He is also had some increased upper extremity swelling.  He is not having any new PND or orthopnea.  He is not having any new chest pressure, neck or arm discomfort.  I did review the nephrology notes and he was under the impression that they had reduced his Coreg by half dose in the evening to 20 5 in the morning and 12 and half in the evening.  However, when I read the notes the intention was to increase his carvedilol to 37.5 mg twice daily hoping to use less hydralazine in the future for reflex tachycardia.  Currently was tachycardic then.  Today his heart rate is okay.  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 Zigmund Daniel, laser therapy OD   Seizures ([place])    pt reports this is from low blood sugar    Past Surgical History:  Procedure Laterality Date   COLONOSCOPY     DIRECT LARYNGOSCOPY N/A 02/10/2020   Procedure: DIRECT LARYNGOSCOPY WITH BIOPSY;  Surgeon: [name], MD;  Location: [facility] OR;  Service: ENT;  Laterality: N/A;   Ear surgery as a child     IR GASTROSTOMY TUBE MOD SED  03/23/2020   IR GASTROSTOMY TUBE REMOVAL  06/01/2020   IR IMAGING GUIDED PORT INSERTION  03/09/2020   IR REMOVAL TUN ACCESS W/ PORT W/O FL MOD SED  02/08/2021   TONSILLECTOMY       Current Outpatient Medications  Medication Sig Dispense Refill   atorvastatin (LIPITOR) 20 MG tablet TAKE 1 TABLET BY MOUTH EVERY DAY 90 tablet 3   Blood Glucose Monitoring Suppl (ONETOUCH VERIO IQ SYSTEM) w/Device KIT 1 each by Does not apply route 2 (two) times daily. 1 kit 0   carvedilol (COREG) 25 MG tablet Take 1 tablet (25  mg total) by mouth 2 (two) times daily. 180 tablet 3   Continuous Blood Gluc Receiver (FREESTYLE LIBRE 2 READER) DEVI Use as directed to monitor blood glucose continuously. DX E11.65 1 each 1   Continuous Blood Gluc Sensor (FREESTYLE LIBRE 2 SENSOR) MISC Use as directed to monitor blood glucose continuously. Change sensor Q 14 days. DX E11.65 2 each 11   diltiazem (CARDIZEM CD) 120 MG 24 hr capsule Take 1 capsule (120 mg total) by mouth daily. 30 capsule 1   Dulaglutide (TRULICITY) 0.35 DH/7.4BU SOPN Inject 0.75 mg into the skin once a week. 2 mL 3   empagliflozin (JARDIANCE) 25 MG TABS tablet Take 1 tablet (25 mg total) by mouth daily. 90 tablet 3   ferrous sulfate 325 (65 FE) MG EC tablet Take 325 mg by mouth 3 (three) times daily with meals.     furosemide (LASIX) 40 MG tablet Take 1 tablet (40 mg total) by mouth daily. 3 tablet 0   glucose blood test strip 1 each by Other route as needed for  other. Use as instructed 100 each 11   hydrALAZINE (APRESOLINE) 50 MG tablet TAKE 1 TABLET BY MOUTH THREE TIMES A DAY 270 tablet 1   Insulin Pen Needle (B-D UF III MINI PEN NEEDLES) 31G X 5 MM MISC USE DAILY WITH INSULIN 100 each 5   Insulin Syringe-Needle U-100 (INSULIN SYRINGE .3CC/31GX5/16") 31G X 5/16" 0.3 ML MISC Use twice daily to inject insulin. 100 each 10   losartan (COZAAR) 100 MG tablet Take 1 tablet (100 mg total) by mouth daily. 90 tablet 0   OneTouch Delica Lancets 38G MISC 1 each by Does not apply route 2 (two) times daily. 200 each 11   warfarin (COUMADIN) 10 MG tablet TAKE 1 TABLET BY MOUTH EVERY DAY 90 tablet 3   No current facility-administered medications for this visit.    Allergies:   Codeine and Metformin and related    ROS:  Please see the history of present illness.   Otherwise, review of systems are positive for none.   All other systems are reviewed and negative.    PHYSICAL EXAM: VS:  BP (!) 140/93   Pulse 71   Ht 5' 9"  (1.753 m)   Wt 200 lb 9.6 oz (91 kg)   SpO2 99%   BMI 29.62 kg/m  , BMI Body mass index is 29.62 kg/m. GENERAL:  Well appearing NECK: Unable to assess jugular venous distention secondary to his chronic neck swelling related to radiation, waveform within normal limits, carotid upstroke brisk and symmetric, no bruits, no thyromegaly LUNGS:  Clear to auscultation bilaterally CHEST:  Unremarkable HEART:  PMI not displaced or sustained,S1 and S2 within normal limits, no S3, no clicks, no rubs, no murmurs, irregular  ABD:  Flat, positive bowel sounds normal in frequency in pitch, no bruits, no rebound, no guarding, no midline pulsatile mass, no hepatomegaly, no splenomegaly EXT:  2 plus pulses throughout, severe bilateral lower extremity edema, no cyanosis no clubbing  EKG:  EKG is  ordered today. Atrial fibrillation, rate 64, left axis deviation, low voltage in limb leads, poor anterior R wave progression, possible old anteroseptal  infarct   Recent Labs:  09/16/2020: TSH 1.583 06/01/2021: ALT 10; BUN 34; Creat 2.19; Hemoglobin 10.0; Platelets 211; Potassium 5.1; Sodium 137    Lipid Panel    Component Value Date/Time   CHOL 135 11/30/2019 0915   TRIG 170 (H) 11/30/2019 0915   HDL 29 (L) 11/30/2019 0915  CHOLHDL 4.7 11/30/2019 0915   VLDL 37 (H) 09/25/2016 0914   LDLCALC 79 11/30/2019 0915      Wt Readings from Last 3 Encounters:  08/17/21 200 lb 9.6 oz (91 kg)  08/14/21 205 lb (93 kg)  08/09/21 197 lb (89.4 kg)      Other studies Reviewed: Additional studies/ records that were reviewed today include:   Nephrology office records, primary care office records Review of the above records demonstrates:  Please see elsewhere in the note.     ASSESSMENT AND PLAN:  Atrial fibrillation: He is in atrial fibrillation but with controlled rate on the Cardizem.  He is actually been taking a slightly lower dose of the carvedilol but I asked him to go back to 25 mg twice daily.  He will continue with anticoagulation.  Syncope: He has had no further syncope.  No change in therapy or further evaluation.  CKD 3B: His creatinine was 1.77 recently which was down from 2.27.   He does have significant lower extremity swelling which will be addressed as below with careful follow-up of his labs.  EDEMA: The patient has increased lower extremity swelling.  He has upper extremity swelling but had recent vascular studies with venous duplex demonstrating no clot.  He had an EF of 55 to 60% on echo in January.  I am going to give him just 3 days of 40 mg Lasix and check a BMET mid next week.  I am also going to make other med changes as below.  DM:  I am going to stop his Actos and start Jardiance 25 mg mg daily.  (This is included on his medication plan.)  This will be for his presumed acute diastolic dysfunction.  I know because it is a consideration we will give him samples and get patient assistance.  I communicated with [name], MD    Current medicines are reviewed at length with the patient today.  The patient does not have concerns regarding medicines.  The following changes have been made: As above  Labs/ tests ordered today include:   Orders Placed This Encounter  Procedures   Basic Metabolic Panel (BMET)      Disposition:   FU with me or APP in one month thanks  .      Signed, Minus Breeding, MD  08/17/2021 2:40 PM    [facility]

## 2021-08-17 NOTE — Patient Instructions (Signed)
Medication Instructions:  Your physician has recommended you make the following change in your medication:  STOP: Pioglitazone (Actos) START: Jardiance 25 mg once daily 3 days: Lasix 40 mg once daily   *If you need a refill on your cardiac medications before your next appointment, please call your pharmacy*   Lab Work: Your physician recommends that you return for lab work in:  NEXT WEEK: BMET If you have labs (blood work) drawn today and your tests are completely normal, you will receive your results only by: [employer] (if you have MyChart) OR A paper copy in the mail If you have any lab test that is abnormal or we need to change your treatment, we will call you to review the results.   Testing/Procedures: None   Follow-Up: At [facility], you and your health needs are our priority.  As part of our continuing mission to provide you with exceptional heart care, we have created designated Provider Care Teams.  These Care Teams include your primary Cardiologist (physician) and Advanced Practice Providers (APPs -  Physician Assistants and Nurse Practitioners) who all work together to provide you with the care you need, when you need it.  We recommend signing up for the patient portal called "MyChart".  Sign up information is provided on this After Visit Summary.  MyChart is used to connect with patients for Virtual Visits (Telemedicine).  Patients are able to view lab/test results, encounter notes, upcoming appointments, etc.  Non-urgent messages can be sent to your provider as well.   To learn more about what you can do with MyChart, go to [email or web address].    Your next appointment:   1 month(s)  The format for your next appointment:   In Person  Provider:   Minus Breeding, MD  or APP   Other Instructions   Important Information About Sugar

## 2021-08-24 ENCOUNTER — Ambulatory Visit: Payer: Self-pay | Admitting: Family Medicine

## 2021-08-24 NOTE — Addendum Note (Signed)
Addended by: Deanna Artis A on: 08/24/2021 07:52 AM   Modules accepted: Orders

## 2021-08-29 ENCOUNTER — Ambulatory Visit: Payer: Medicare HMO | Admitting: Family Medicine

## 2021-08-31 ENCOUNTER — Telehealth: Payer: Self-pay | Admitting: Pharmacist

## 2021-08-31 NOTE — Progress Notes (Signed)
Chronic Care Management Pharmacy Assistant   Name: Brian [name]  MRN: [id number] DOB: 12-30-1955   Reason for Encounter: Disease State - Hypertension Call     Recent office visits:  08/14/21 Jenna Luo, MD - Afib - Family Medicine - INR done. diltiazem (CARDIZEM CD) 120 MG 24 hr capsule prescribed. Add diltiazem 120 mg a day to his carvedilol to try to facilitate better rate control.  Hold Coumadin tomorrow.  Then decrease total weekly amount by reducing his Coumadin to 5 mg on Monday Wednesday Friday and Saturday and 10 mg on Tuesday, Thursday, and Sunday.  Recheck INR in 4 weeks  Recent consult visits:  None noted.   Hospital visits:  None in previous 6 months  Medications: Outpatient Encounter Medications as of 08/31/2021  Medication Sig   atorvastatin (LIPITOR) 20 MG tablet TAKE 1 TABLET BY MOUTH EVERY DAY   Blood Glucose Monitoring Suppl (ONETOUCH VERIO IQ SYSTEM) w/Device KIT 1 each by Does not apply route 2 (two) times daily.   carvedilol (COREG) 25 MG tablet Take 1 tablet (25 mg total) by mouth 2 (two) times daily.   Continuous Blood Gluc Receiver (FREESTYLE LIBRE 2 READER) DEVI Use as directed to monitor blood glucose continuously. DX E11.65   Continuous Blood Gluc Sensor (FREESTYLE LIBRE 2 SENSOR) MISC Use as directed to monitor blood glucose continuously. Change sensor Q 14 days. DX E11.65   diltiazem (CARDIZEM CD) 120 MG 24 hr capsule Take 1 capsule (120 mg total) by mouth daily.   Dulaglutide (TRULICITY) 5.63 JS/9.7WY SOPN Inject 0.75 mg into the skin once a week.   empagliflozin (JARDIANCE) 25 MG TABS tablet Take 1 tablet (25 mg total) by mouth daily.   ferrous sulfate 325 (65 FE) MG EC tablet Take 325 mg by mouth 3 (three) times daily with meals.   furosemide (LASIX) 40 MG tablet Take 1 tablet (40 mg total) by mouth daily.   glucose blood test strip 1 each by Other route as needed for other. Use as instructed   hydrALAZINE (APRESOLINE) 50 MG tablet TAKE 1  TABLET BY MOUTH THREE TIMES A DAY   Insulin Pen Needle (B-D UF III MINI PEN NEEDLES) 31G X 5 MM MISC USE DAILY WITH INSULIN   Insulin Syringe-Needle U-100 (INSULIN SYRINGE .3CC/31GX5/16") 31G X 5/16" 0.3 ML MISC Use twice daily to inject insulin.   losartan (COZAAR) 100 MG tablet Take 1 tablet (100 mg total) by mouth daily.   OneTouch Delica Lancets 63Z MISC 1 each by Does not apply route 2 (two) times daily.   warfarin (COUMADIN) 10 MG tablet TAKE 1 TABLET BY MOUTH EVERY DAY   No facility-administered encounter medications on file as of 08/31/2021.    Current antihypertensive regimen:  Amlodipine 55m daily Carvedilol 274mBID Chlorthalidone 2590maily Clonidine 0.1mg58mice daily Hydralazine 50mg25m Losartan 50mg 33my diltiazem (CARDIZEM CD) 120 MG 24 hr capsule  How often are you checking your Blood Pressure?  Patient reported checking blood pressures weekly   Current home BP readings: 130/92 (08/14/21)   What recent interventions/DTPs have been made by any provider to improve Blood Pressure control since last CPP Visit:  Patient added Diltiazem 120 mg daily on 08/14/21   Any recent hospitalizations or ED visits since last visit with CPP?  Patient has not had any hospitalizations or ED visits since last visit with CPP.    What diet changes have been made to improve Blood Pressure Control?  Patient reported beng careful with his  salt intake.    What exercise is being done to improve your Blood Pressure Control?  Patient reported he tries to remain active.     Adherence Review: Is the patient currently on ACE/ARB medication? Yes Does the patient have >5 day gap between last estimated fill dates? No   Care Gaps   AWV: done 03/30/21 Colonoscopy: done 08/12/18 DM Eye Exam: due 06/21/21 DM Foot Exam: due 02/17/20 Microalbumin: done 02/17/19 HbgAIC: done 06/01/21 (7.5) DEXA:  N/A Mammogram: N/A   Star Rating Drugs: Atorvastatin (LIPITOR) 20 MG tablet - last filled  06/08/21 90 days  Dulaglutide (TRULICITY) 5.92 TW/4.4QK SOPN - last filled 08/14/21 28 days  Losartan (COZAAR) 100 MG tablet - last filled 07/12/21 90 days  Pioglitazone (ACTOS) 15 MG tablet - last filled 06/19/21 90 days     Future Appointments  Date Time Provider [place]  09/11/2021 11:00 AM [name], MD [facility]  09/14/2021 11:20 AM Warren Lacy, PA-C [facility]  09/18/2021 11:15 AM Delice Bison, Charlestine Massed, NP [facility] None  10/04/2021  3:50 PM [name], MD [facility] None  03/20/2022  9:30 AM [name], MD [facility] None  04/05/2022  9:00 AM BSFM-NURSE HEALTH ADVISOR [facility] [place], [facility] Clinical Pharmacist Assistant  [phone]

## 2021-09-01 ENCOUNTER — Encounter: Payer: Self-pay | Admitting: Hematology and Oncology

## 2021-09-01 DIAGNOSIS — Z79899 Other long term (current) drug therapy: Secondary | ICD-10-CM | POA: Diagnosis not present

## 2021-09-01 DIAGNOSIS — I4891 Unspecified atrial fibrillation: Secondary | ICD-10-CM | POA: Diagnosis not present

## 2021-09-01 LAB — BASIC METABOLIC PANEL
BUN/Creatinine Ratio: 10 (ref 10–24)
BUN: 17 mg/dL (ref 8–27)
CO2: 25 mmol/L (ref 20–29)
Calcium: 8.9 mg/dL (ref 8.6–10.2)
Chloride: 104 mmol/L (ref 96–106)
Creatinine, Ser: 1.73 mg/dL — ABNORMAL HIGH (ref 0.76–1.27)
Glucose: 119 mg/dL — ABNORMAL HIGH (ref 70–99)
Potassium: 4 mmol/L (ref 3.5–5.2)
Sodium: 139 mmol/L (ref 134–144)
eGFR: 42 mL/min/{1.73_m2} — ABNORMAL LOW (ref >59)

## 2021-09-11 ENCOUNTER — Other Ambulatory Visit: Payer: Self-pay

## 2021-09-11 ENCOUNTER — Ambulatory Visit (INDEPENDENT_AMBULATORY_CARE_PROVIDER_SITE_OTHER): Payer: Medicare HMO | Admitting: Family Medicine

## 2021-09-11 ENCOUNTER — Ambulatory Visit: Payer: Medicare HMO | Admitting: Family Medicine

## 2021-09-11 VITALS — BP 152/88 | HR 140 | Temp 98.0°F | Ht 69.0 in | Wt 183.4 lb

## 2021-09-11 DIAGNOSIS — I4891 Unspecified atrial fibrillation: Secondary | ICD-10-CM | POA: Diagnosis not present

## 2021-09-11 MED ORDER — EMPAGLIFLOZIN 25 MG PO TABS: 25.0000 mg | ORAL_TABLET | Freq: Every day | ORAL | 3 refills | Status: DC

## 2021-09-11 MED ORDER — WARFARIN SODIUM 10 MG PO TABS: 10.0000 mg | ORAL_TABLET | Freq: Every day | ORAL | 3 refills | Status: DC

## 2021-09-11 NOTE — Addendum Note (Signed)
Addended by: Randal Buba K on: 09/11/2021 11:39 AM   Modules accepted: Orders

## 2021-09-11 NOTE — Progress Notes (Signed)
Subjective:    Patient ID: Brian [name], male    DOB: Mar 24, 1955, 66 y.o.   MRN: [id number]  HPI 08/14/21 Heart rate was over 120 at his oncologist last week.  Here today his heart rate is averaging around 100 bpm.  Brian [name] denies any shortness of breath or chest pain however Brian [name] does have pitting edema in both legs.  His lungs are clear.  Brian [name] denies any syncope or near syncope.  INR today is elevated at 4.  Brian [name] is currently taking 5 mg of Coumadin on Tuesdays, Thursdays, and Sundays and 10 mg of Coumadin on Monday, Wednesday, Friday, and Saturday.  At that time, my plan was:  Add diltiazem 120 mg a day to his carvedilol to try to facilitate better rate control.  Hold Coumadin tomorrow.  Then decrease total weekly amount by reducing his Coumadin to 5 mg on Monday Wednesday Friday and Saturday and 10 mg on Tuesday, Thursday, and Sunday.  Recheck INR in 4 weeks  09/11/21 Patient is here today for follow-up.  His INR today is 1.4 with a PT of 16.8.  However Brian [name] states that Brian [name] has been out of his Coumadin for the last 3 to 4 days and has not been taking it.  I asked him why Brian [name] has not gotten it refilled and Brian [name] states that Brian [name] did not have transportation to the pharmacy.  Brian [name] has a refill at the pharmacy and I sent another refill today however, Brian [name] has not had a way to get to the pharmacy apparently.  His heart rate is extremely fast at 140 bpm.  It is irregularly irregular.  Brian [name] states that Brian [name] took his carvedilol and his diltiazem yesterday.  However Brian [name] has not taken his medication yet this morning.  Recently saw cardiology who switched his Trulicity to [facility] for cardiovascular benefit.  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 Zigmund Daniel, laser therapy OD   Seizures ([place])    pt reports this is from low blood sugar   Past Surgical History:  Procedure Laterality Date   COLONOSCOPY     DIRECT LARYNGOSCOPY N/A 02/10/2020   Procedure: DIRECT LARYNGOSCOPY WITH BIOPSY;  Surgeon: [name], MD;  Location: [facility] OR;  Service: ENT;  Laterality: N/A;   Ear surgery as a child     IR GASTROSTOMY TUBE MOD SED  03/23/2020   IR GASTROSTOMY TUBE REMOVAL  06/01/2020   IR IMAGING GUIDED PORT INSERTION  03/09/2020   IR REMOVAL TUN ACCESS W/ PORT W/O FL MOD SED  02/08/2021   TONSILLECTOMY     Social History   Socioeconomic History   Marital status: Divorced    Spouse name: Not on file   Number of children: 2   Years of education: Not on file   Highest education level: Not on file  Occupational History   Occupation: Disabled  Tobacco Use   Smoking status: Never   Smokeless tobacco: Never  Vaping Use   Vaping Use: Never used  Substance and Sexual Activity   Alcohol use: Yes    Alcohol/week:  0.0 standard drinks of alcohol    Comment: seldom   Drug use: Yes    Frequency: 7.0 times per week    Comment: marijauna for arthritis, last 03-13-20   Sexual activity: Not Currently  Other Topics Concern   Not on file  Social History Narrative   Two children.  5 grandchildren.     Social Determinants of Health   Financial Resource Strain: Low Risk  (03/30/2021)   Overall Financial Resource Strain (CARDIA)    Difficulty of Paying Living Expenses: Not hard at all  Food Insecurity: No Food Insecurity (03/30/2021)   Hunger Vital Sign    Worried About Running Out of Food in the Last Year: Never true    Ran Out of Food in the Last Year: Never true  Transportation Needs: No Transportation Needs (03/30/2021)   PRAPARE - Hydrologist (Medical): No    Lack of Transportation (Non-Medical): No  Physical Activity: Sufficiently Active (03/30/2021)   Exercise Vital Sign     Days of Exercise per Week: 5 days    Minutes of Exercise per Session: 30 min  Stress: No Stress Concern Present (03/30/2021)   [place]    Feeling of Stress : Not at all  Social Connections: Socially Isolated (03/30/2021)   Social Connection and Isolation Panel [NHANES]    Frequency of Communication with Friends and Family: More than three times a week    Frequency of Social Gatherings with Friends and Family: More than three times a week    Attends Religious Services: Never    Marine scientist or Organizations: No    Attends Archivist Meetings: Never    Marital Status: Divorced  Human resources officer Violence: Not At Risk (03/30/2021)   Humiliation, Afraid, Rape, and Kick questionnaire    Fear of Current or Ex-Partner: No    Emotionally Abused: No    Physically Abused: No    Sexually Abused: No   Current Outpatient Medications on File Prior to Visit  Medication Sig Dispense Refill   atorvastatin (LIPITOR) 20 MG tablet TAKE 1 TABLET BY MOUTH EVERY DAY 90 tablet 3   Blood Glucose Monitoring Suppl (ONETOUCH VERIO IQ SYSTEM) w/Device KIT 1 each by Does not apply route 2 (two) times daily. 1 kit 0   Continuous Blood Gluc Receiver (FREESTYLE LIBRE 2 READER) DEVI Use as directed to monitor blood glucose continuously. DX E11.65 1 each 1   Continuous Blood Gluc Sensor (FREESTYLE LIBRE 2 SENSOR) MISC Use as directed to monitor blood glucose continuously. Change sensor Q 14 days. DX E11.65 2 each 11   diltiazem (CARDIZEM CD) 120 MG 24 hr capsule Take 1 capsule (120 mg total) by mouth daily. 30 capsule 1   ferrous sulfate 325 (65 FE) MG EC tablet Take 325 mg by mouth 3 (three) times daily with meals.     furosemide (LASIX) 40 MG tablet Take 1 tablet (40 mg total) by mouth daily. 3 tablet 0   glucose blood test strip 1 each by Other route as needed for other. Use as instructed 100 each 11   hydrALAZINE (APRESOLINE) 50 MG  tablet TAKE 1 TABLET BY MOUTH THREE TIMES A DAY 270 tablet 1   Insulin Pen Needle (B-D UF III MINI PEN NEEDLES) 31G X 5 MM MISC USE DAILY WITH INSULIN 100 each 5   Insulin Syringe-Needle U-100 (INSULIN SYRINGE .3CC/31GX5/16") 31G X 5/16" 0.3 ML MISC Use twice daily  to inject insulin. 100 each 10   losartan (COZAAR) 100 MG tablet Take 1 tablet (100 mg total) by mouth daily. 90 tablet 0   OneTouch Delica Lancets 93Y MISC 1 each by Does not apply route 2 (two) times daily. 200 each 11   carvedilol (COREG) 25 MG tablet Take 1 tablet (25 mg total) by mouth 2 (two) times daily. 180 tablet 3   No current facility-administered medications on file prior to visit.   Allergies  Allergen Reactions   Codeine Other (See Comments)    Unknown reaction, severe headach   Metformin And Related Diarrhea     Review of Systems  All other systems reviewed and are negative.      Objective:   Physical Exam Vitals reviewed.  Constitutional:      Appearance: Normal appearance. Brian [name] is normal weight.  Cardiovascular:     Rate and Rhythm: Tachycardia present. Rhythm irregular.     Heart sounds: Normal heart sounds.  Pulmonary:     Effort: Pulmonary effort is normal.     Breath sounds: Normal breath sounds. No rales.  Musculoskeletal:     Right lower leg: Edema present.     Left lower leg: Edema present.  Neurological:     Mental Status: Brian [name] is alert.           Assessment & Plan:  Atrial fibrillation, unspecified type ([place]) - Plan: Protime-INR, CANCELED: INR Patient's INR is subtherapeutic however Brian [name] has been out of Coumadin for 3 days.  Therefore I will ask him to go home immediately and start taking his Coumadin.  I sent a new refill to his pharmacy while the patient is here.  Brian [name] is also tachycardic today.  After sitting here, his heart rate was in the 120 range consistently.  I asked him to go home and take 2 diltiazem, 240 mg, and call me tomorrow with an update on his heart rate.  My plan will be  to increase the Cardizem to 240 mg daily if his heart rate is better controlled.  If Brian [name] develops shortness of breath or chest pain Brian [name] is instructed to go to the hospital immediately.  Also question if the patient is consistently taking his medication appropriately

## 2021-09-12 ENCOUNTER — Telehealth: Payer: Self-pay

## 2021-09-12 ENCOUNTER — Encounter: Payer: Self-pay | Admitting: *Deleted

## 2021-09-12 NOTE — Telephone Encounter (Signed)
LM for pt to returncall

## 2021-09-12 NOTE — Telephone Encounter (Signed)
Pt called stated that per your request per OV 09/11/21  HR-[id number] O2-96%

## 2021-09-13 ENCOUNTER — Other Ambulatory Visit: Payer: Self-pay

## 2021-09-13 MED ORDER — DILTIAZEM HCL ER COATED BEADS 240 MG PO CP24: 240.0000 mg | ORAL_CAPSULE | Freq: Every day | ORAL | 0 refills | Status: DC

## 2021-09-13 NOTE — Telephone Encounter (Signed)
Spoke with pt's son, Brennin, re pt's heart rate.  Per Dr.Pickard, he increase your cardizem to 240 mg daily and we will recheck your heart rate on Friday.    Pt's son want it to know if pt can just double up on his current dose (120mg ) by taking 2 caps daily =240mg . Advice pt that should be fine.   Also advice pt that the new Rx will be sent to pt's pharmacy   Any questions please contact our office.

## 2021-09-13 NOTE — Progress Notes (Unsigned)
Cardiology Office Note:    Date:  09/14/2021   ID:  Brian [name], DOB 02-Jul-1955, MRN [id number]  PCP:  [name], MD [place] Cardiologist: Minus Breeding, MD   Reason for visit: 1 month follow-up  History of Present Illness:    Brian [name] is a 66 y.o. male with a hx of atrial fibrillation, cardiomyopathy with normalized EF, CVA, hypertension.  He last saw Dr. Percival Spanish August 17, 2021 with A-fib with RVR.  Patient's PCP had added Cardizem.  Patient denied palpitations but noted increased LE swelling.  Patient was found to have incorrectly decreased his Coreg when his nephrologist recommended increasing it to 37.5 mg twice a day.    Today, patient states he feels well.  He states that 3 days of Lasix improved his lower extremity edema and his weight decreased.  He states he is doing okay on Jardiance but does worry that after his samples run out, he will have a high co-pay.  He went to Dr. Samella Parr office 3 days ago for an INR.  While he was there his heart rate was in the 120s.  His Cardizem was increased from 120 mg daily to 240 mg daily.  Patient denies palpitations, chest pain, shortness of breath, lightheadedness, PND and orthopnea.  He estimates his dry weight around 180 pounds.    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 Zigmund Daniel, laser therapy OD   Seizures ([place])    pt reports this is from low blood sugar    Past Surgical History:  Procedure Laterality Date   COLONOSCOPY     DIRECT LARYNGOSCOPY N/A 02/10/2020   Procedure: DIRECT LARYNGOSCOPY WITH BIOPSY;  Surgeon: [name], MD;  Location: [facility];  Service: ENT;  Laterality: N/A;   Ear surgery as a child     IR  GASTROSTOMY TUBE MOD SED  03/23/2020   IR GASTROSTOMY TUBE REMOVAL  06/01/2020   IR IMAGING GUIDED PORT INSERTION  03/09/2020   IR REMOVAL TUN ACCESS W/ PORT W/O FL MOD SED  02/08/2021   TONSILLECTOMY      Current Medications: Current Meds  Medication Sig   atorvastatin (LIPITOR) 20 MG tablet TAKE 1 TABLET BY MOUTH EVERY DAY   Blood Glucose Monitoring Suppl (ONETOUCH VERIO IQ SYSTEM) w/Device KIT 1 each by Does not apply route 2 (two) times daily.   carvedilol (COREG) 25 MG tablet Take 1 tablet (25 mg total) by mouth 2 (two) times daily. (Patient taking differently: Take 25 mg by mouth 2 (two) times daily. Take 1.5 Tablets Twice Daily.)   Continuous Blood Gluc Receiver (FREESTYLE LIBRE 2 READER) DEVI Use as directed to monitor blood glucose continuously. DX E11.65   Continuous Blood Gluc Sensor (FREESTYLE LIBRE 2 SENSOR) MISC Use as directed to monitor blood glucose continuously. Change sensor Q 14 days. DX E11.65   diltiazem (CARTIA XT) 240 MG 24 hr capsule Take 1 capsule (240 mg total) by mouth daily.   empagliflozin (JARDIANCE) 25 MG TABS tablet Take 1 tablet (25 mg total) by mouth daily.   ferrous sulfate 325 (65 FE) MG EC tablet Take 325 mg  by mouth 3 (three) times daily with meals.   glucose blood test strip 1 each by Other route as needed for other. Use as instructed   hydrALAZINE (APRESOLINE) 50 MG tablet TAKE 1 TABLET BY MOUTH THREE TIMES A DAY   Insulin Pen Needle (B-D UF III MINI PEN NEEDLES) 31G X 5 MM MISC USE DAILY WITH INSULIN   Insulin Syringe-Needle U-100 (INSULIN SYRINGE .3CC/31GX5/16") 31G X 5/16" 0.3 ML MISC Use twice daily to inject insulin.   losartan (COZAAR) 100 MG tablet Take 1 tablet (100 mg total) by mouth daily.   OneTouch Delica Lancets 99H MISC 1 each by Does not apply route 2 (two) times daily.   warfarin (COUMADIN) 10 MG tablet Take 1 tablet (10 mg total) by mouth daily.   [DISCONTINUED] furosemide (LASIX) 40 MG tablet Take 1 tablet (40 mg total) by mouth  daily. (Patient taking differently: Take 40 mg by mouth as needed for edema or fluid.)     Allergies:   Codeine and Metformin and related   Social History   Socioeconomic History   Marital status: Divorced    Spouse name: Not on file   Number of children: 2   Years of education: Not on file   Highest education level: Not on file  Occupational History   Occupation: Disabled  Tobacco Use   Smoking status: Never   Smokeless tobacco: Never  Vaping Use   Vaping Use: Never used  Substance and Sexual Activity   Alcohol use: Yes    Alcohol/week: 0.0 standard drinks of alcohol    Comment: seldom   Drug use: Yes    Frequency: 7.0 times per week    Comment: marijauna for arthritis, last 03-13-20   Sexual activity: Not Currently  Other Topics Concern   Not on file  Social History Narrative   Two children.  5 grandchildren.     Social Determinants of Health   Financial Resource Strain: Low Risk  (03/30/2021)   Overall Financial Resource Strain (CARDIA)    Difficulty of Paying Living Expenses: Not hard at all  Food Insecurity: No Food Insecurity (03/30/2021)   Hunger Vital Sign    Worried About Running Out of Food in the Last Year: Never true    Ran Out of Food in the Last Year: Never true  Transportation Needs: No Transportation Needs (03/30/2021)   PRAPARE - Hydrologist (Medical): No    Lack of Transportation (Non-Medical): No  Physical Activity: Sufficiently Active (03/30/2021)   Exercise Vital Sign    Days of Exercise per Week: 5 days    Minutes of Exercise per Session: 30 min  Stress: No Stress Concern Present (03/30/2021)   [place]    Feeling of Stress : Not at all  Social Connections: Socially Isolated (03/30/2021)   Social Connection and Isolation Panel [NHANES]    Frequency of Communication with Friends and Family: More than three times a week    Frequency of Social Gatherings with  Friends and Family: More than three times a week    Attends Religious Services: Never    Marine scientist or Organizations: No    Attends Music therapist: Never    Marital Status: Divorced     Family History: The patient's family history includes Breast cancer in his sister; Colon cancer in his mother; Hyperlipidemia in his father; Hypertension in his father. There is no history of Coronary artery disease,  Stomach cancer, Rectal cancer, or Esophageal cancer.  ROS:   Please see the history of present illness.     EKGs/Labs/Other Studies Reviewed:    Recent Labs: 09/16/2020: TSH 1.583 06/01/2021: ALT 10; Hemoglobin 10.0; Platelets 211 09/01/2021: BUN 17; Creatinine, Ser 1.73; Potassium 4.0; Sodium 139   Recent Lipid Panel Lab Results  Component Value Date/Time   CHOL 135 11/30/2019 09:15 AM   TRIG 170 (H) 11/30/2019 09:15 AM   HDL 29 (L) 11/30/2019 09:15 AM   LDLCALC 79 11/30/2019 09:15 AM    Physical Exam:    VS:  BP (!) 150/92   Pulse 98   Ht 5' 9"  (1.753 m)   Wt 186 lb 3.2 oz (84.5 kg)   SpO2 96%   BMI 27.50 kg/m    No data found.  HYPERTENSION CONTROL Vitals:   09/14/21 1131 09/14/21 1133  BP: (!) 158/96 (!) 150/92    The patient's blood pressure is elevated above target today.  The following intervention was performed to address the patient's elevated BP:  - Blood pressure will be monitored at home to determine if medication changes need to be made.   Will monitor blood pressure with new increase of Cardizem.  Wt Readings from Last 3 Encounters:  09/14/21 186 lb 3.2 oz (84.5 kg)  09/11/21 183 lb 6.4 oz (83.2 kg)  08/17/21 200 lb 9.6 oz (91 kg)    GEN: Well nourished, well developed in no acute distress, looks older than stated age 48: Normal NECK: No JVD; No carotid bruits CARDIAC: Irregular irregular, no murmurs, rubs, gallops RESPIRATORY:  Clear to auscultation without rales, wheezing or rhonchi  ABDOMEN: Soft, non-tender,  non-distended MUSCULOSKELETAL: 1-2+ bil edema to knees SKIN: Warm and dry NEUROLOGIC:  Alert and oriented PSYCHIATRIC:  Normal affect    ASSESSMENT AND PLAN   Atrial fibrillation, rate controlled today -Continue Cardizem 240 mg and carvedilol 37.5 mg twice daily.  Goal heart rate less than 110.  Can titrate Cardizem to 367m if needed in the future. -Continue anticoagulation for stroke prevention with Coumadin  Diastolic dysfunction Lower extremity edema -2D echo January 2023 with EF 55 to 60%, mild LVH, grade 2 diastolic dysfunction, normal RV, mildly dilated LA -Prescribed 3 days of Lasix 40 mg at June appointment. -He has residual 1-2+ lower extremity edema.  Recommend salt restriction, leg elevation, increase mobility and compression stockings. -Given prescription for Lasix 40 mg as needed for worsening leg swelling.  We have to be cautious with his CKD.  HTN, BP elevated today -States home BP typically 130s over 80s. -Repeat blood pressure in the office 150/92.  Cardizem increased 3 days ago. -Asked to bring blood pressure and heart rate log at follow-up in 3 months.  Patient states Dr. PDennard Schaumannis managing his blood pressure.  He is also on hydralazine 50 mg 3 times daily and losartan 100 mg daily.  Diabetes mellitus -Dr. HPercival Spanishstopped patient's Actos and started Jardiance 25 mg daily in June -discussed with Dr. PDennard Schaumann -Gave patient assistance paperwork for Jardiance.  Dispo: Follow-up in 3 months to monitor rate control.  Medication Adjustments/Labs and Tests Ordered: Current medicines are reviewed at length with the patient today.  Concerns regarding medicines are outlined above.  No orders of the defined types were placed in this encounter.  Meds ordered this encounter  Medications   furosemide (LASIX) 40 MG tablet    Sig: Take 1 tablet (40 mg total) by mouth as needed for fluid or edema.  Dispense:  30 tablet    Refill:  3    Patient Instructions  Medication  Instructions:  No Changes *If you need a refill on your cardiac medications before your next appointment, please call your pharmacy*   Lab Work: No Labs If you have labs (blood work) drawn today and your tests are completely normal, you will receive your results only by: [place] (if you have MyChart) OR A paper copy in the mail If you have any lab test that is abnormal or we need to change your treatment, we will call you to review the results.   Testing/Procedures: No Testing   Follow-Up: At [facility], you and your health needs are our priority.  As part of our continuing mission to provide you with exceptional heart care, we have created designated Provider Care Teams.  These Care Teams include your primary Cardiologist (physician) and Advanced Practice Providers (APPs -  Physician Assistants and Nurse Practitioners) who all work together to provide you with the care you need, when you need it.  We recommend signing up for the patient portal called "MyChart".  Sign up information is provided on this After Visit Summary.  MyChart is used to connect with patients for Virtual Visits (Telemedicine).  Patients are able to view lab/test results, encounter notes, upcoming appointments, etc.  Non-urgent messages can be sent to your provider as well.   To learn more about what you can do with MyChart, go to [email or web address].    Your next appointment:   3 month(s)  The format for your next appointment:   In Person  Provider:   Minus Breeding, MD       Important Information About Sugar         Signed, Warren Lacy, PA-C  09/14/2021 12:19 PM    [facility]

## 2021-09-13 NOTE — Progress Notes (Signed)
09/12/21 per Dr. Dennard Schaumann increase dose to 240mg  daily due high HR

## 2021-09-14 ENCOUNTER — Ambulatory Visit: Payer: Medicare HMO | Admitting: Physician Assistant

## 2021-09-14 ENCOUNTER — Encounter: Payer: Self-pay | Admitting: Physician Assistant

## 2021-09-14 VITALS — BP 150/92 | HR 98 | Ht 69.0 in | Wt 186.2 lb

## 2021-09-14 DIAGNOSIS — R600 Localized edema: Secondary | ICD-10-CM | POA: Diagnosis not present

## 2021-09-14 DIAGNOSIS — I5189 Other ill-defined heart diseases: Secondary | ICD-10-CM

## 2021-09-14 DIAGNOSIS — I4891 Unspecified atrial fibrillation: Secondary | ICD-10-CM

## 2021-09-14 DIAGNOSIS — I10 Essential (primary) hypertension: Secondary | ICD-10-CM | POA: Diagnosis not present

## 2021-09-14 LAB — PT WITH INR/FINGERSTICK
INR, fingerstick: 1.4 ratio — ABNORMAL HIGH
PT, fingerstick: 16.8 s — ABNORMAL HIGH (ref 10.5–13.1)

## 2021-09-14 MED ORDER — FUROSEMIDE 40 MG PO TABS: 40.0000 mg | ORAL_TABLET | ORAL | 3 refills | Status: DC | PRN

## 2021-09-14 NOTE — Patient Instructions (Signed)
Medication Instructions:  No Changes *If you need a refill on your cardiac medications before your next appointment, please call your pharmacy*   Lab Work: No Labs If you have labs (blood work) drawn today and your tests are completely normal, you will receive your results only by: [place] (if you have MyChart) OR A paper copy in the mail If you have any lab test that is abnormal or we need to change your treatment, we will call you to review the results.   Testing/Procedures: No Testing   Follow-Up: At [facility], you and your health needs are our priority.  As part of our continuing mission to provide you with exceptional heart care, we have created designated Provider Care Teams.  These Care Teams include your primary Cardiologist (physician) and Advanced Practice Providers (APPs -  Physician Assistants and Nurse Practitioners) who all work together to provide you with the care you need, when you need it.  We recommend signing up for the patient portal called "MyChart".  Sign up information is provided on this After Visit Summary.  MyChart is used to connect with patients for Virtual Visits (Telemedicine).  Patients are able to view lab/test results, encounter notes, upcoming appointments, etc.  Non-urgent messages can be sent to your provider as well.   To learn more about what you can do with MyChart, go to [email or web address].    Your next appointment:   3 month(s)  The format for your next appointment:   In Person  Provider:   Minus Breeding, MD       Important Information About Sugar

## 2021-09-14 NOTE — Telephone Encounter (Signed)
Spoke w/son appt made 10/22/21 at 915recheck coumadine/re hr per Dr. Dennard Schaumann

## 2021-09-18 ENCOUNTER — Telehealth: Payer: Self-pay | Admitting: *Deleted

## 2021-09-18 ENCOUNTER — Inpatient Hospital Stay: Payer: Medicare HMO | Attending: Adult Health | Admitting: Adult Health

## 2021-09-18 ENCOUNTER — Other Ambulatory Visit: Payer: Self-pay

## 2021-09-18 VITALS — BP 169/101 | HR 75 | Temp 97.5°F | Resp 18 | Ht 69.0 in | Wt 184.6 lb

## 2021-09-18 DIAGNOSIS — Z85810 Personal history of malignant neoplasm of tongue: Secondary | ICD-10-CM | POA: Insufficient documentation

## 2021-09-18 DIAGNOSIS — R1310 Dysphagia, unspecified: Secondary | ICD-10-CM | POA: Diagnosis not present

## 2021-09-18 DIAGNOSIS — C01 Malignant neoplasm of base of tongue: Secondary | ICD-10-CM

## 2021-09-18 DIAGNOSIS — Z923 Personal history of irradiation: Secondary | ICD-10-CM | POA: Diagnosis not present

## 2021-09-18 DIAGNOSIS — Z79899 Other long term (current) drug therapy: Secondary | ICD-10-CM | POA: Diagnosis not present

## 2021-09-18 DIAGNOSIS — Z9221 Personal history of antineoplastic chemotherapy: Secondary | ICD-10-CM | POA: Insufficient documentation

## 2021-09-18 NOTE — Progress Notes (Signed)
CLINIC:  Survivorship  REASON FOR VISIT:  Routine follow-up for history of head & neck cancer.  BRIEF ONCOLOGIC HISTORY:  Oncology History Overview Note  Brian [name] is a 66 y.o. male who presented two-week history of facial swelling in late November/early December 2021. Subsequently, the patient saw Dr. Benjamine Mola, who performed a direct laryngoscopy with biopsy on the date of 02/10/2020. Final pathology revealed: squamous cell carcinoma, P16 positive   He had the following imaging performed  1. Soft tissue neck CT scan performed on 01/28/2020 revealing right glossotonsillar malignancy with bulky bilateral nodal disease. Nodal masses sowed definite extracapsular spread. There was also noted to be incidental advanced cervical spine degeneration with cord compression.  2. PET scan performed on 02/26/2020 revealing a hypermetabolic lesion at the right base of the tongue and right tonsil, consistent with oropharyngeal carcinoma. Carcinoma appeared to be confined to the mucosal surface. There were also noted to bulky bilateral hypermetabolic level II metastatic lymph nodes and smaller inferior hypermetabolic level II metastatic lymph nodes. There was no evidence of metastatic disease in the chest, abdomen, or pelvis. The two small right middle lobe pulmonary nodules present on CT from 2012 were consistent with benign etiology.  Started radiation on 03/14/2020 Chemo delayed by a week due to GFR and infusion issues. Started chemotherapy on 03/21/2020 C2 delayed because of AKI Now switched to weekly carboplatin given ongoing AKI C1 of weekly carboplatin on 04/04/2020 C2 of weekly carboplatin on 04/11/2020 C3 of weekly carboplatin on 04/18/2020 C4 Weekly carboplatin on 04/25/2020 Last planned cycle omitted.   Malignant neoplasm of base of tongue ([place])  03/01/2020 Cancer Staging   Staging form: Pharynx - HPV-Mediated Oropharynx, AJCC 8th Edition - Clinical stage from 03/01/2020: Stage III (cT2, cN3,  cM0, p16+) - Signed by [name], MD on 03/02/2020   03/02/2020 Initial Diagnosis   Malignant neoplasm of base of tongue ([place])   03/21/2020 - 03/28/2020 Chemotherapy         04/04/2020 -  Chemotherapy   Completed chemotherapy on 04/25/2020  Patient is on Treatment Plan: HEAD/NECK CARBOPLATIN Q7D         INTERVAL HISTORY:  Brian [name] tells me that he is feeling irritable today because he is tired of appointments.  He works night shift and it is the middle of the day and he did have some curse words that were expressed prior to my arrival in the exam room.  He tells me that he is feeling moderately well.  He does have difficulty swallowing certain foods such as chicken, beef.  He says that bread is not as bad.  When he does experience dysphagia he will just stop eating and put his food in the refrigerator.  Brian [name] tells me that he has follow-up with Brian [name] our SLP soon.  His weight has remained stable over the past several weeks.  He tells me that his diet is well-balanced.  Brian [name] does have some swelling in his neck that he experienced he tells me that he cannot locate his lymphedema compression garment.  He is seen in his ear nose and throat specialist every 3 to 6 months.  He is not following up with the dentist regularly.  He is due CT neck and chest later this week to evaluate for recurrence followed by follow-up with Brian [name] that day.     ADDITIONAL REVIEW OF SYSTEMS:  Review of Systems  Constitutional:  Positive for fatigue. Negative for appetite change, chills, fever and unexpected weight change.  HENT:  Positive for trouble swallowing. Negative for hearing loss and lump/mass.   Eyes:  Negative for eye problems and icterus.  Respiratory:  Negative for chest tightness, cough and shortness of breath.   Cardiovascular:  Negative for chest pain, leg swelling and palpitations.  Gastrointestinal:  Negative for abdominal distention, abdominal pain, constipation, diarrhea, nausea and vomiting.   Endocrine: Negative for hot flashes.  Genitourinary:  Negative for difficulty urinating.   Musculoskeletal:  Negative for arthralgias.  Skin:  Negative for itching and rash.  Neurological:  Negative for dizziness, extremity weakness, headaches and numbness.  Hematological:  Negative for adenopathy. Does not bruise/bleed easily.  Psychiatric/Behavioral:  Negative for depression. The patient is not nervous/anxious.        CURRENT MEDICATIONS:  Current Outpatient Medications on File Prior to Visit  Medication Sig Dispense Refill   atorvastatin (LIPITOR) 20 MG tablet TAKE 1 TABLET BY MOUTH EVERY DAY 90 tablet 3   diltiazem (CARTIA XT) 240 MG 24 hr capsule Take 1 capsule (240 mg total) by mouth daily. 30 capsule 0   empagliflozin (JARDIANCE) 25 MG TABS tablet Take 1 tablet (25 mg total) by mouth daily. 90 tablet 3   ferrous sulfate 325 (65 FE) MG EC tablet Take 325 mg by mouth 3 (three) times daily with meals.     hydrALAZINE (APRESOLINE) 50 MG tablet TAKE 1 TABLET BY MOUTH THREE TIMES A DAY 270 tablet 1   losartan (COZAAR) 100 MG tablet Take 1 tablet (100 mg total) by mouth daily. 90 tablet 0   warfarin (COUMADIN) 10 MG tablet Take 1 tablet (10 mg total) by mouth daily. (Patient taking differently: Take 10 mg by mouth daily. Pt takes 5 mg four times a week and 10 mg 3 times a week) 90 tablet 3   Blood Glucose Monitoring Suppl (ONETOUCH VERIO IQ SYSTEM) w/Device KIT 1 each by Does not apply route 2 (two) times daily. (Patient not taking: Reported on 09/18/2021) 1 kit 0   carvedilol (COREG) 25 MG tablet Take 1 tablet (25 mg total) by mouth 2 (two) times daily. (Patient taking differently: Take 25 mg by mouth 2 (two) times daily. Take 1.5 Tablets Twice Daily.) 180 tablet 3   Continuous Blood Gluc Sensor (FREESTYLE LIBRE 2 SENSOR) MISC Use as directed to monitor blood glucose continuously. Change sensor Q 14 days. DX E11.65 (Patient not taking: Reported on 09/18/2021) 2 each 11   furosemide (LASIX)  40 MG tablet Take 1 tablet (40 mg total) by mouth as needed for fluid or edema. (Patient not taking: Reported on 09/18/2021) 30 tablet 3   glucose blood test strip 1 each by Other route as needed for other. Use as instructed (Patient not taking: Reported on 09/18/2021) 100 each 11   No current facility-administered medications on file prior to visit.    ALLERGIES:  Allergies  Allergen Reactions   Codeine Other (See Comments)    Unknown reaction, severe headach   Metformin And Related Diarrhea     PHYSICAL EXAM:  Vitals:   09/18/21 1129  BP: (!) 169/101  Pulse: 75  Resp: 18  Temp: (!) 97.5 F (36.4 C)  SpO2: 100%   Filed Weights   09/18/21 1129  Weight: 184 lb 9.6 oz (83.7 kg)     General: Well-nourished, well-appearing male in no acute distress.  Unaccompanied today.  HEENT: Head is atraumatic and normocephalic.  Pupils equal and reactive to light. Conjunctivae clear without exudate.  Sclerae anicteric. Oral mucosa is pink and moist without lesions.  Tongue pink, moist, and midline. Oropharynx is pink and moist, without lesions. Lymph: No preauricular, postauricular, cervical, supraclavicular, or infraclavicular lymphadenopathy noted on palpation.   Neck: No palpable masses. Skin on neck is swollen, + lymphedema noted.  Cardiovascular: Normal rate and rhythm. Respiratory: Clear to auscultation bilaterally. Chest expansion symmetric without accessory muscle use; breathing non-labored.  GI: Abdomen soft and round. Non-tender, non-distended. Bowel sounds normoactive.  GU: Deferred.   Neuro: No focal deficits. Steady gait.   Psych: Normal mood and affect for situation. Extremities: No edema.  Skin: Warm and dry.    LABORATORY DATA:  None at this visit.  DIAGNOSTIC IMAGING:  None at this visit.    ASSESSMENT & PLAN:  Brian [name] is a pleasant 66 y.o. male with history of squamous cell at the base of his tongue diagnosed in January 2022 status post concurrent  chemoradiation. Patient presents to survivorship clinic today for routine follow-up after finishing treatment.   1. 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     2. Nutritional status: Brian [name] reports that he is currently able to consume adequate nutrition by mouth/via tube.  Weight is stable   3. At risk for dysphagia: Brian [name] does express some dysphagia and is going to follow-up with Brian [name] about this soon.  I recommended that he keep this appointment.  4.  At risk for neck lymphedema: His physical exam is consistent with lymphedema in his neck from his radiation therapy.  I will reach out to PT to see how we can order a replacement garment for his neck.   5.  At risk for hypothyroidism: The thyroid gland is often affected after treatment for head & neck cancer.  I reviewed this with Brian [name] and he was unhappy about this possibility.  I will reach out to Dr. Dennard Schaumann to see if he can check the patient's TSH with his annual lab check.  6 Health maintenance and wellness promotion: Cancer patients who consume a diet rich in fruits and vegetables have better overall health and decreased risk of cancer recurrence. Brian [name] was encouraged to consume 5-7 servings of fruits and vegetables per day, as tolerated. Brian [name] was also encouraged to engage in moderate to vigorous exercise for 30 minutes per day most days of the week.   7. Support services/counseling: It is not uncommon for this period of the patient's cancer care trajectory to be one of many emotions and stressors.  We discussed an opportunity for him to participate in the next session of Head & Neck FYNN ("Finding Your New Normal") support group series, designed for patients after they have completed treatment.   Brian [name] was encouraged to take advantage of our many other support services programs, support groups, and/or counseling in  coping with his new life as a cancer survivor after completing anti-cancer treatment. The patient was offered support today through active listening and expressive supportive counseling.     Dispo:  -CT neck and chest 10/02/2021 -f/u with Brian [name] 10/04/2021  Total encounter time: 40 minutes in face-to-face visit time, survivorship care plan creation, coordination with the care team, reviewed with the patient and patient education, and documentation of the encounter.  Wilber Bihari, NP 09/20/21 1:53 PM Medical Oncology and Hematology [facility] [place],  [address] Tel. [phone]    Fax. [phone]  *Total Encounter Time as defined by the Centers for Medicare  and Medicaid Services includes, in addition to the face-to-face time of a patient visit (documented in the note above) non-face-to-face time: obtaining and reviewing outside history, ordering and reviewing medications, tests or procedures, care coordination (communications with other health care professionals or caregivers) and documentation in the medical record.

## 2021-09-18 NOTE — Telephone Encounter (Signed)
CALLED PATIENT TO INFORM OF CT FOR 10-02-21- ARRIVAL TIME- 12:30 PM @ [facility], PATIENT TO HAVE WATER ONLY- 4 HRS. PRIOR TO TEST, PATIENT TO RECEIVE RESULTS FROM DR. SQUIRE ON 10-04-21 @ 3:50 PM, SPOKE WITH PATIENT'S SON- Brian [name] AND HE IS AWARE OF THESE APPTS. AND THE INSTRUCTIONS

## 2021-09-20 ENCOUNTER — Encounter: Payer: Self-pay | Admitting: Hematology and Oncology

## 2021-09-20 ENCOUNTER — Encounter: Payer: Self-pay | Admitting: Adult Health

## 2021-09-21 ENCOUNTER — Ambulatory Visit (INDEPENDENT_AMBULATORY_CARE_PROVIDER_SITE_OTHER): Payer: Medicare HMO | Admitting: Family Medicine

## 2021-09-21 VITALS — BP 150/102 | HR 71 | Temp 97.9°F | Ht 69.0 in | Wt 184.0 lb

## 2021-09-21 DIAGNOSIS — I480 Paroxysmal atrial fibrillation: Secondary | ICD-10-CM | POA: Diagnosis not present

## 2021-09-21 LAB — PT WITH INR/FINGERSTICK
INR, fingerstick: 2.8 ratio — ABNORMAL HIGH
PT, fingerstick: 34.2 s — ABNORMAL HIGH (ref 10.5–13.1)

## 2021-09-21 MED ORDER — DILTIAZEM HCL ER 240 MG PO TB24: 240.0000 mg | ORAL_TABLET | Freq: Every day | ORAL | 3 refills | Status: DC

## 2021-09-21 NOTE — Progress Notes (Signed)
Subjective:    Patient ID: Brian [name], male    DOB: 1955-10-13, 66 y.o.   MRN: [id number]  HPI 08/14/21 Heart rate was over 120 at his oncologist last week.  Here today his heart rate is averaging around 100 bpm.  He denies any shortness of breath or chest pain however he does have pitting edema in both legs.  His lungs are clear.  He denies any syncope or near syncope.  INR today is elevated at 4.  He is currently taking 5 mg of Coumadin on Tuesdays, Thursdays, and Sundays and 10 mg of Coumadin on Monday, Wednesday, Friday, and Saturday.  At that time, my plan was:  Add diltiazem 120 mg a day to his carvedilol to try to facilitate better rate control.  Hold Coumadin tomorrow.  Then decrease total weekly amount by reducing his Coumadin to 5 mg on Monday Wednesday Friday and Saturday and 10 mg on Tuesday, Thursday, and Sunday.  Recheck INR in 4 weeks  09/11/21 Patient is here today for follow-up.  His INR today is 1.4 with a PT of 16.8.  However he states that he has been out of his Coumadin for the last 3 to 4 days and has not been taking it.  I asked him why he has not gotten it refilled and he states that he did not have transportation to the pharmacy.  He has a refill at the pharmacy and I sent another refill today however, he has not had a way to get to the pharmacy apparently.  His heart rate is extremely fast at 140 bpm.  It is irregularly irregular.  He states that he took his carvedilol and his diltiazem yesterday.  However he has not taken his medication yet this morning.  Recently saw cardiology who switched his Trulicity to [facility] for cardiovascular benefit.  He has not picked up the new prescription of Jardiance.  At that time, my plan was:  Patient's INR is subtherapeutic however he has been out of Coumadin for 3 days.  Therefore I will ask him to go home immediately and start taking his Coumadin.  I sent a new refill to his pharmacy while the patient is here.  He is also  tachycardic today.  After sitting here, his heart rate was in the 120 range consistently.  I asked him to go home and take 2 diltiazem, 240 mg, and call me tomorrow with an update on his heart rate.  My plan will be to increase the Cardizem to 240 mg daily if his heart rate is better controlled.  If he develops shortness of breath or chest pain he is instructed to go to the hospital immediately.  Also question if the patient is consistently taking his medication appropriately  09/21/21 Patient has resumed his Coumadin.  He is taking 5 mg on Monday, Wednesday, Friday, and Saturday and 10 mg on Tuesday, Thursday, and Sunday.  His INR is back to a therapeutic range of 2.8.  We also increased his diltiazem to 240 mg.  His heart rate today is much better controlled.  Today his heart rate, although irregular, is ranging between 80 and 90 bpm.  He denies any chest pain or shortness of breath or lightheadedness.  He saw his cancer specialist yesterday who is requested a TSH.  I will be happy to get that today. 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 Zigmund Daniel, laser therapy OD   Seizures ([place])    pt reports this is from low blood sugar   Past Surgical History:  Procedure Laterality Date   COLONOSCOPY     DIRECT LARYNGOSCOPY N/A 02/10/2020   Procedure: DIRECT LARYNGOSCOPY WITH BIOPSY;  Surgeon: [name], MD;  Location: [place] OR;  Service: ENT;  Laterality: N/A;   Ear surgery as a child     IR GASTROSTOMY TUBE MOD SED  03/23/2020   IR GASTROSTOMY TUBE REMOVAL  06/01/2020   IR IMAGING GUIDED PORT INSERTION  03/09/2020   IR REMOVAL TUN ACCESS W/ PORT W/O FL MOD SED  02/08/2021   TONSILLECTOMY     Social History   Socioeconomic  History   Marital status: Divorced    Spouse name: Not on file   Number of children: 2   Years of education: Not on file   Highest education level: Not on file  Occupational History   Occupation: Disabled  Tobacco Use   Smoking status: Never   Smokeless tobacco: Never  Vaping Use   Vaping Use: Never used  Substance and Sexual Activity   Alcohol use: Yes    Alcohol/week: 0.0 standard drinks of alcohol    Comment: seldom   Drug use: Yes    Frequency: 7.0 times per week    Comment: marijauna for arthritis, last 03-13-20   Sexual activity: Not Currently  Other Topics Concern   Not on file  Social History Narrative   Two children.  5 grandchildren.     Social Determinants of Health   Financial Resource Strain: Low Risk  (03/30/2021)   Overall Financial Resource Strain (CARDIA)    Difficulty of Paying Living Expenses: Not hard at all  Food Insecurity: No Food Insecurity (03/30/2021)   Hunger Vital Sign    Worried About Running Out of Food in the Last Year: Never true    Ran Out of Food in the Last Year: Never true  Transportation Needs: No Transportation Needs (03/30/2021)   PRAPARE - Hydrologist (Medical): No    Lack of Transportation (Non-Medical): No  Physical Activity: Sufficiently Active (03/30/2021)   Exercise Vital Sign    Days of Exercise per Week: 5 days    Minutes of Exercise per Session: 30 min  Stress: No Stress Concern Present (03/30/2021)   [place]    Feeling of Stress : Not at all  Social Connections: Socially Isolated (03/30/2021)   Social Connection and Isolation Panel [NHANES]    Frequency of Communication with Friends and Family: More than three times a week    Frequency of Social Gatherings with Friends and Family: More than three times a week    Attends Religious Services: Never    Marine scientist or Organizations: No    Attends Archivist Meetings:  Never    Marital Status: Divorced  Human resources officer Violence: Not At Risk (03/30/2021)   Humiliation, Afraid, Rape, and Kick questionnaire    Fear of Current or Ex-Partner: No    Emotionally Abused: No    Physically Abused: No    Sexually Abused: No   Current Outpatient Medications on File  Prior to Visit  Medication Sig Dispense Refill   atorvastatin (LIPITOR) 20 MG tablet TAKE 1 TABLET BY MOUTH EVERY DAY 90 tablet 3   diltiazem (CARTIA XT) 240 MG 24 hr capsule Take 1 capsule (240 mg total) by mouth daily. 30 capsule 0   empagliflozin (JARDIANCE) 25 MG TABS tablet Take 1 tablet (25 mg total) by mouth daily. 90 tablet 3   ferrous sulfate 325 (65 FE) MG EC tablet Take 325 mg by mouth 3 (three) times daily with meals.     hydrALAZINE (APRESOLINE) 50 MG tablet TAKE 1 TABLET BY MOUTH THREE TIMES A DAY 270 tablet 1   losartan (COZAAR) 100 MG tablet Take 1 tablet (100 mg total) by mouth daily. 90 tablet 0   warfarin (COUMADIN) 10 MG tablet Take 1 tablet (10 mg total) by mouth daily. (Patient taking differently: Take 10 mg by mouth daily. Pt takes 5 mg four times a week and 10 mg 3 times a week) 90 tablet 3   Blood Glucose Monitoring Suppl (ONETOUCH VERIO IQ SYSTEM) w/Device KIT 1 each by Does not apply route 2 (two) times daily. (Patient not taking: Reported on 09/18/2021) 1 kit 0   carvedilol (COREG) 25 MG tablet Take 1 tablet (25 mg total) by mouth 2 (two) times daily. (Patient taking differently: Take 25 mg by mouth 2 (two) times daily. Take 1.5 Tablets Twice Daily.) 180 tablet 3   Continuous Blood Gluc Sensor (FREESTYLE LIBRE 2 SENSOR) MISC Use as directed to monitor blood glucose continuously. Change sensor Q 14 days. DX E11.65 (Patient not taking: Reported on 09/18/2021) 2 each 11   furosemide (LASIX) 40 MG tablet Take 1 tablet (40 mg total) by mouth as needed for fluid or edema. (Patient not taking: Reported on 09/18/2021) 30 tablet 3   glucose blood test strip 1 each by Other route as needed for  other. Use as instructed (Patient not taking: Reported on 09/18/2021) 100 each 11   No current facility-administered medications on file prior to visit.   Allergies  Allergen Reactions   Codeine Other (See Comments)    Unknown reaction, severe headach   Metformin And Related Diarrhea     Review of Systems  All other systems reviewed and are negative.      Objective:   Physical Exam Vitals reviewed.  Constitutional:      Appearance: Normal appearance. He is normal weight.  Cardiovascular:     Rate and Rhythm: Normal rate. Rhythm irregular.     Heart sounds: Normal heart sounds.  Pulmonary:     Effort: Pulmonary effort is normal.     Breath sounds: Normal breath sounds. No rales.  Musculoskeletal:     Right lower leg: Edema present.     Left lower leg: Edema present.  Neurological:     Mental Status: He is alert.           Assessment & Plan:  Paroxysmal atrial fibrillation ([place]) - Plan: TSH Heart rate is much better today.  Continue diltiazem extended release 240 mg daily.  INR is therapeutic today.  Continue Coumadin 5 mg on Monday, Wednesday, Friday, and Saturday and 10 mg on Tuesday, Thursday, Sunday.  Plan to recheck INR in 4 weeks.  Check TSH as requested.

## 2021-09-22 LAB — TSH: TSH: 2.94 mIU/L (ref 0.40–4.50)

## 2021-10-02 ENCOUNTER — Encounter (HOSPITAL_COMMUNITY): Payer: Self-pay

## 2021-10-02 ENCOUNTER — Ambulatory Visit (HOSPITAL_COMMUNITY)
Admission: RE | Admit: 2021-10-02 | Discharge: 2021-10-02 | Disposition: A | Discharge status: Home / Self Care | Payer: Medicare HMO | Source: Ambulatory Visit | Attending: Radiation Oncology | Admitting: Radiation Oncology

## 2021-10-02 DIAGNOSIS — Z85810 Personal history of malignant neoplasm of tongue: Secondary | ICD-10-CM | POA: Diagnosis not present

## 2021-10-02 DIAGNOSIS — C01 Malignant neoplasm of base of tongue: Secondary | ICD-10-CM | POA: Diagnosis not present

## 2021-10-02 DIAGNOSIS — J384 Edema of larynx: Secondary | ICD-10-CM | POA: Diagnosis not present

## 2021-10-02 DIAGNOSIS — K056 Periodontal disease, unspecified: Secondary | ICD-10-CM | POA: Diagnosis not present

## 2021-10-02 DIAGNOSIS — C3432 Malignant neoplasm of lower lobe, left bronchus or lung: Secondary | ICD-10-CM | POA: Insufficient documentation

## 2021-10-02 DIAGNOSIS — J90 Pleural effusion, not elsewhere classified: Secondary | ICD-10-CM | POA: Diagnosis not present

## 2021-10-02 DIAGNOSIS — C3490 Malignant neoplasm of unspecified part of unspecified bronchus or lung: Secondary | ICD-10-CM | POA: Diagnosis not present

## 2021-10-02 DIAGNOSIS — J392 Other diseases of pharynx: Secondary | ICD-10-CM | POA: Diagnosis not present

## 2021-10-03 ENCOUNTER — Other Ambulatory Visit: Payer: Self-pay | Admitting: Family Medicine

## 2021-10-03 NOTE — Telephone Encounter (Signed)
rx dose was increased Requested Prescriptions  Pending Prescriptions Disp Refills  . diltiazem (CARDIZEM CD) 120 MG 24 hr capsule [Pharmacy Med Name: DILTIAZEM 24H ER(CD) 120 MG CP] 30 capsule 1    Sig: TAKE 1 CAPSULE BY MOUTH EVERY DAY     Cardiovascular: Calcium Channel Blockers 3 Failed - 10/03/2021  2:31 PM      Failed - Cr in normal range and within 360 days    Creat  Date Value Ref Range Status  06/01/2021 2.19 (H) 0.70 - 1.35 mg/dL Final   Creatinine, Ser  Date Value Ref Range Status  09/01/2021 1.73 (H) 0.76 - 1.27 mg/dL Final   Creatinine, Urine  Date Value Ref Range Status  12/12/2009 226.8 mg/dL Final         Failed - Last BP in normal range    BP Readings from Last 1 Encounters:  09/21/21 (!) 150/102         Passed - ALT in normal range and within 360 days    ALT  Date Value Ref Range Status  06/01/2021 10 9 - 46 U/L Final  03/20/2021 13 0 - 44 U/L Final         Passed - AST in normal range and within 360 days    AST  Date Value Ref Range Status  06/01/2021 12 10 - 35 U/L Final  03/20/2021 14 (L) 15 - 41 U/L Final         Passed - Last Heart Rate in normal range    Pulse Readings from Last 1 Encounters:  09/21/21 71         Passed - Valid encounter within last 6 months    Recent Outpatient Visits          2 months ago Atrial fibrillation, unspecified type ([place])   [place] [name], MD   4 months ago Atrial fibrillation, unspecified type ([place])   [place] [name], MD   5 months ago Atrial fibrillation, unspecified type [facility])   [place] [name], MD   6 months ago Atrial fibrillation, unspecified type [facility])   [place] [name], MD   7 months ago Atrial fibrillation, unspecified type [facility])   [place] Pickard, Cammie Mcgee, MD      Future Appointments            In 1 month Pickard, Cammie Mcgee, MD [place], [place]   In 1 month Minus Breeding, MD [facility] [place], [facility]

## 2021-10-04 ENCOUNTER — Ambulatory Visit
Admission: RE | Admit: 2021-10-04 | Discharge: 2021-10-04 | Disposition: A | Discharge status: Home / Self Care | Payer: Medicare HMO | Source: Ambulatory Visit | Attending: Radiation Oncology | Admitting: Radiation Oncology

## 2021-10-04 ENCOUNTER — Encounter: Payer: Self-pay | Admitting: Radiation Oncology

## 2021-10-04 ENCOUNTER — Other Ambulatory Visit: Payer: Self-pay

## 2021-10-04 VITALS — BP 145/84 | HR 90 | Temp 97.3°F | Resp 18 | Ht 69.0 in | Wt 176.2 lb

## 2021-10-04 DIAGNOSIS — C3432 Malignant neoplasm of lower lobe, left bronchus or lung: Secondary | ICD-10-CM | POA: Diagnosis not present

## 2021-10-04 DIAGNOSIS — C01 Malignant neoplasm of base of tongue: Secondary | ICD-10-CM | POA: Insufficient documentation

## 2021-10-04 NOTE — Progress Notes (Signed)
Radiation Oncology         [phone] ________________________________  Name: Brian [name] MRN: [id number]  Date: 10/04/2021  DOB: 1955-10-15  Follow-Up Visit Note  CC: [name], MD  [name], MD  Diagnosis and Prior Radiotherapy:       ICD-10-CM   1. Malignant neoplasm of base of tongue ([place])  C01 DG OP Swallowing Func-Medicare/Speech Path    Ambulatory Referral to Speech Therapy    CT Soft Tissue Neck Wo Contrast    2. Primary cancer of left lower lobe of lung (HCC)  C34.32 CT Soft Tissue Neck Wo Contrast    CT Chest Wo Contrast       Cancer Staging  Malignant neoplasm of base of tongue (HCC) Staging form: Pharynx - HPV-Mediated Oropharynx, AJCC 8th Edition - Clinical stage from 03/01/2020: Stage III (cT2, cN3, cM0, p16+) - Signed by [name], MD on 03/02/2020 Stage prefix: Initial diagnosis  Radiation Treatment Dates: 03/14/2020 through 05/04/2020 Site Technique Total Dose (Gy) Dose per Fx (Gy) Completed Fx Beam Energies  Neck: HN_BOT IMRT 70/70 2 35/35 6X   From 06/21/2021 to 07/06/2021 he received 60 Gray in 5 fractions, stereotactic body radiation therapy, to left lower lobe lung mass  CHIEF COMPLAINT: Swallowing issues  Narrative:    [name] presents today for follow-up after completing SBRT to his left lung on 07/06/2021, and radiation to his base of tongue on 05/04/2020. And to review CT scan results from 10/02/2021  Pain issues, if any: Patient denies Using a feeding tube?: N/A Weight changes, if any: Reports a healthy appetite Wt Readings from Last 3 Encounters:  10/04/21 176 lb 4 oz (79.9 kg)  09/21/21 184 lb (83.5 kg)  09/18/21 184 lb 9.6 oz (83.7 kg)   Respiratory concerns: Denies any new issues or concerns Swallowing issues, if any: Yes--struggles with meat and fried foods. Denies any issues with thin liquids Smoking or chewing tobacco? None Using fluoride trays daily? N/A--denies any new mouth sores or oral concerns Last ENT  visit was on: Saw Dr. [name] in March, and is scheduled to see him again in September Other notable issues, if any: Nothing else of note. Reports he feels relatively good and is doing well    ALLERGIES:  is allergic to codeine and metformin and related.  Meds: Current Outpatient Medications  Medication Sig Dispense Refill   atorvastatin (LIPITOR) 20 MG tablet TAKE 1 TABLET BY MOUTH EVERY DAY 90 tablet 3   Blood Glucose Monitoring Suppl (ONETOUCH VERIO IQ SYSTEM) w/Device KIT 1 each by Does not apply route 2 (two) times daily. (Patient not taking: Reported on 09/18/2021) 1 kit 0   carvedilol (COREG) 25 MG tablet Take 1 tablet (25 mg total) by mouth 2 (two) times daily. (Patient taking differently: Take 25 mg by mouth 2 (two) times daily. Take 1.5 Tablets Twice Daily.) 180 tablet 3   Continuous Blood Gluc Sensor (FREESTYLE LIBRE 2 SENSOR) MISC Use as directed to monitor blood glucose continuously. Change sensor Q 14 days. DX E11.65 (Patient not taking: Reported on 09/18/2021) 2 each 11   diltiazem (CARDIZEM LA) 240 MG 24 hr tablet Take 1 tablet (240 mg total) by mouth daily. 90 tablet 3   diltiazem (CARTIA XT) 240 MG 24 hr capsule Take 1 capsule (240 mg total) by mouth daily. 30 capsule 0   empagliflozin (JARDIANCE) 25 MG TABS tablet Take 1 tablet (25 mg total) by mouth daily. 90 tablet 3   ferrous sulfate  325 (65 FE) MG EC tablet Take 325 mg by mouth 3 (three) times daily with meals.     furosemide (LASIX) 40 MG tablet Take 1 tablet (40 mg total) by mouth as needed for fluid or edema. (Patient not taking: Reported on 09/18/2021) 30 tablet 3   glucose blood test strip 1 each by Other route as needed for other. Use as instructed (Patient not taking: Reported on 09/18/2021) 100 each 11   hydrALAZINE (APRESOLINE) 50 MG tablet TAKE 1 TABLET BY MOUTH THREE TIMES A DAY 270 tablet 1   losartan (COZAAR) 100 MG tablet Take 1 tablet (100 mg total) by mouth daily. 90 tablet 0   warfarin (COUMADIN) 10 MG tablet  Take 1 tablet (10 mg total) by mouth daily. (Patient taking differently: Take 10 mg by mouth daily. Pt takes 5 mg four times a week and 10 mg 3 times a week) 90 tablet 3   No current facility-administered medications for this encounter.    Physical Findings: The patient is in no acute distress. Patient is alert and oriented. Wt Readings from Last 3 Encounters:  10/04/21 176 lb 4 oz (79.9 kg)  09/21/21 184 lb (83.5 kg)  09/18/21 184 lb 9.6 oz (83.7 kg)    height is 5' 9"  (1.753 m) and weight is 176 lb 4 oz (79.9 kg). His temporal temperature is 97.3 F (36.3 C) (abnormal). His blood pressure is 145/84 (abnormal) and his pulse is 90. His respiration is 18 and oxygen saturation is 99%. .  General: Alert and oriented, in no acute distress HEENT: Head is normocephalic. Extraocular movements are intact. Oropharynx is notable for no thrush,  no tumor.    Neck: Significant anterior lymphedema, no obvious adenopathy to deep palpation. Skin: neck skin intact and satisfactorily healed Psychiatric: Pleasant to speak with. Affect is appropriate. Chest CTAB   Extremities: Pitting edema in the ankles bilaterally.  He has edema in the left forearm wrist and hand with contracture of his third through fifth digits  Lab Findings: Lab Results  Component Value Date   WBC 4.2 06/01/2021   HGB 10.0 (L) 06/01/2021   HCT 31.8 (L) 06/01/2021   MCV 86.2 06/01/2021   PLT 211 06/01/2021    Lab Results  Component Value Date   TSH 2.94 09/21/2021    Radiographic Findings: CT Soft Tissue Neck Wo Contrast  Result Date: 10/03/2021 CLINICAL DATA:  Malignant neoplasm of the base of the tongue EXAM: CT NECK WITHOUT CONTRAST TECHNIQUE: Multidetector CT imaging of the neck was performed following the standard protocol without intravenous contrast. RADIATION DOSE REDUCTION: This exam was performed according to the departmental dose-optimization program which includes automated exposure control, adjustment of the mA  and/or kV according to patient size and/or use of iterative reconstruction technique. COMPARISON:  PET scan 06/08/2021.  Neck CT 01/28/2020 FINDINGS: Pharynx and larynx: No definite residual or recurrent tumor at the right base of the tongue or tonsillar region by noncontrast CT. Mild mucosal and submucosal edematous change in the oropharynx, hypopharynx and laryngeal region consistent with post treatment edema. Salivary glands: No parotid or submandibular lesion. Thyroid: Normal Lymph nodes: There is irregular scarring in the region of the bilateral previously seen level 2 and level 3 lymphadenopathy. There is no evidence of increasing density or any new identifiable lesion. Vascular: Calcification at the carotid bifurcations right more than left. Limited intracranial: Widespread white matter low-density presumed due to chronic small vessel disease. Old right parietal infarction. Visualized orbits: Normal Mastoids and visualized  paranasal sinuses: Paranasal sinuses are clear. Left mastoid region is clear. There is opacification of the middle ear and attic on the right which could possibly be due to cholesteatoma. This is progressive since December of 2021. Skeleton: Advanced chronic cervical spondylosis without evidence of acute or destructive lesion. Dental and periodontal disease is noted. Upper chest: Lung apices show minimal scarring. Other: None IMPRESSION: No evidence of residual or recurrent tumor at the right tongue base/tonsil region. Post treatment edema throughout the region. Chronic appearing scarring at the site of previous massive lymphadenopathy at both level 2 and level 3 regions. This appears similar compared to the CT from April 2023. One could not rule out early recurrent disease in those regions but that is not particularly suggested, and follow-up would be warranted. Electronically Signed   By: Nelson Chimes M.D.   On: 10/03/2021 13:47   CT Chest Wo Contrast  Result Date: 10/03/2021 CLINICAL  DATA:  Non-small-cell lung cancer. Restaging. History of head neck cancer. EXAM: CT CHEST WITHOUT CONTRAST TECHNIQUE: Multidetector CT imaging of the chest was performed following the standard protocol without IV contrast. RADIATION DOSE REDUCTION: This exam was performed according to the departmental dose-optimization program which includes automated exposure control, adjustment of the mA and/or kV according to patient size and/or use of iterative reconstruction technique. COMPARISON:  PET-CT 06/08/2021.  Chest CT 05/29/2021. FINDINGS: Cardiovascular: The heart size is normal. No substantial pericardial effusion. Coronary artery calcification is evident. Ascending thoracic aorta measures 4.1 cm diameter. Mild atherosclerotic calcification is noted in the wall of the thoracic aorta. Mediastinum/Nodes: No mediastinal lymphadenopathy. No evidence for gross hilar lymphadenopathy although assessment is limited by the lack of intravenous contrast on the current study. The esophagus has normal imaging features. There is no axillary lymphadenopathy. Lungs/Pleura: 5 mm right middle lobe nodule 106/5 is stable in the interval. 4 mm right middle lobe nodule on 109/5 is also unchanged since prior. Subpleural scarring in the anterior left lung apex is probably related to radiation therapy. 12 mm left lower lobe pulmonary nodule seen previously has nearly resolved in the interval with only a subtle 5 mm subpleural nodule visible dislocation today (image 119/series 5). Atelectasis or scarring noted in the posterior lingula with subtle ground-glass attenuation in the peripheral left lower lobe around the pulmonary nodule potentially related to radiation therapy. There is a tiny left pleural effusion. Upper Abdomen: Unremarkable. Musculoskeletal: No worrisome lytic or sclerotic osseous abnormality. IMPRESSION: 1. 12 mm left lower lobe pulmonary nodule seen previously has nearly resolved in the interval with only a subtle 5 mm  subpleural nodule remaining today. 2. Stable right middle lobe pulmonary nodules. 3. Tiny left pleural effusion. 4. 4.1 cm ascending thoracic aortic aneurysm. Recommend annual imaging followup by CTA or MRA. This recommendation follows 2010 ACCF/AHA/AATS/ACR/ASA/SCA/SCAI/SIR/STS/SVM Guidelines for the Diagnosis and Management of Patients with Thoracic Aortic Disease. Circulation. 2010; 121: J570-V779. Aortic aneurysm NOS (ICD10-I71.9) 5. Aortic Atherosclerosis (ICD10-I70.0). Electronically Signed   By: Misty Stanley M.D.   On: 10/03/2021 09:29     Impression/Plan:    1) Head and Neck Cancer Status: I reviewed his imaging with him and his son.  He has had an excellent response to SBRT to his lung.  No evidence of recurrence in his neck.  He is in remission  2) Nutritional Status:  tolerating oral intake, PEG removed, but does report new swallowing issues with solids and some weight loss as noted -see #3 Wt Readings from Last 3 Encounters:  10/04/21 176 lb  4 oz (79.9 kg)  09/21/21 184 lb (83.5 kg)  09/18/21 184 lb 9.6 oz (83.7 kg)     3)   Swallowing:   We will refer him back to speech-language pathology and order an MBSS due to new dysphagia  4) Dental: Encouraged to continue regular followup with dentistry, and dental hygiene including fluoride products.      5) Thyroid function: Check annually in med onc Lab Results  Component Value Date   TSH 2.94 09/21/2021    6) Other: Continue PT at home for neck edema as tolerated. (His lymphedema is substantial.  But at this time he prefers not to resume appointments with physical therapy)    7) follow-up with me in 4 months with repeat imaging of neck and chest  On date of service, in total, I spent 30 minutes on this encounter. Patient was seen in person. _____________________________________   [name], MD

## 2021-10-04 NOTE — Progress Notes (Signed)
Brian [name] presents today for follow-up after completing SBRT to his left lung on 07/06/2021, and radiation to his base of tongue on 05/04/2020. And to review CT scan results from 10/02/2021  Pain issues, if any: Patient denies Using a feeding tube?: N/A Weight changes, if any: Reports a healthy appetite Wt Readings from Last 3 Encounters:  10/04/21 176 lb 4 oz (79.9 kg)  09/21/21 184 lb (83.5 kg)  09/18/21 184 lb 9.6 oz (83.7 kg)   Respiratory concerns: Denies any new issues or concerns Swallowing issues, if any: Yes--struggles with meat and fried foods. Denies any issues with thin liquids Smoking or chewing tobacco? None Using fluoride trays daily? N/A--denies any new mouth sores or oral concerns Last ENT visit was on: Saw Dr. [name] in March, and is scheduled to see him again in September Other notable issues, if any: Nothing else of note. Reports he feels relatively good and is doing well

## 2021-10-06 ENCOUNTER — Other Ambulatory Visit: Payer: Self-pay

## 2021-10-06 ENCOUNTER — Other Ambulatory Visit: Payer: Self-pay | Admitting: Family Medicine

## 2021-10-06 DIAGNOSIS — C01 Malignant neoplasm of base of tongue: Secondary | ICD-10-CM

## 2021-10-10 ENCOUNTER — Telehealth (HOSPITAL_COMMUNITY): Payer: Self-pay

## 2021-10-10 NOTE — Telephone Encounter (Signed)
Attempted to contact patient to schedule Modified Barium Swallow - left voicemail. 

## 2021-10-13 ENCOUNTER — Other Ambulatory Visit: Payer: Self-pay | Admitting: Family Medicine

## 2021-10-13 NOTE — Telephone Encounter (Signed)
Requested Prescriptions  Pending Prescriptions Disp Refills  . diltiazem (CARDIZEM CD) 240 MG 24 hr capsule [Pharmacy Med Name: DILTIAZEM 24H ER(CD) 240 MG CP] 30 capsule 0    Sig: TAKE 1 CAPSULE BY MOUTH EVERY DAY     Cardiovascular: Calcium Channel Blockers 3 Failed - 10/13/2021  9:33 AM      Failed - Cr in normal range and within 360 days    Creat  Date Value Ref Range Status  06/01/2021 2.19 (H) 0.70 - 1.35 mg/dL Final   Creatinine, Ser  Date Value Ref Range Status  09/01/2021 1.73 (H) 0.76 - 1.27 mg/dL Final   Creatinine, Urine  Date Value Ref Range Status  12/12/2009 226.8 mg/dL Final         Failed - Last BP in normal range    BP Readings from Last 1 Encounters:  10/04/21 (!) 145/84         Passed - ALT in normal range and within 360 days    ALT  Date Value Ref Range Status  06/01/2021 10 9 - 46 U/L Final  03/20/2021 13 0 - 44 U/L Final         Passed - AST in normal range and within 360 days    AST  Date Value Ref Range Status  06/01/2021 12 10 - 35 U/L Final  03/20/2021 14 (L) 15 - 41 U/L Final         Passed - Last Heart Rate in normal range    Pulse Readings from Last 1 Encounters:  10/04/21 90         Passed - Valid encounter within last 6 months    Recent Outpatient Visits          3 months ago Atrial fibrillation, unspecified type ([place])   [place] [name], MD   4 months ago Atrial fibrillation, unspecified type ([place])   [place] [name], MD   6 months ago Atrial fibrillation, unspecified type [facility])   [place] [name], MD   6 months ago Atrial fibrillation, unspecified type [facility])   [place] [name], MD   7 months ago Atrial fibrillation, unspecified type [facility])   [place] Pickard, Cammie Mcgee, MD      Future Appointments            In 2 weeks Pickard, Cammie Mcgee, MD [place], [place]   In 1 month  Minus Breeding, MD [facility] [place]

## 2021-10-26 ENCOUNTER — Ambulatory Visit (HOSPITAL_COMMUNITY)
Admission: RE | Admit: 2021-10-26 | Discharge: 2021-10-26 | Disposition: A | Discharge status: Home / Self Care | Payer: Medicare HMO | Source: Ambulatory Visit | Attending: Family Medicine | Admitting: Family Medicine

## 2021-10-26 ENCOUNTER — Encounter: Payer: Self-pay | Admitting: Family Medicine

## 2021-10-26 DIAGNOSIS — R1313 Dysphagia, pharyngeal phase: Secondary | ICD-10-CM | POA: Diagnosis not present

## 2021-10-26 DIAGNOSIS — Z85810 Personal history of malignant neoplasm of tongue: Secondary | ICD-10-CM | POA: Insufficient documentation

## 2021-10-26 DIAGNOSIS — C01 Malignant neoplasm of base of tongue: Secondary | ICD-10-CM

## 2021-10-26 DIAGNOSIS — I129 Hypertensive chronic kidney disease with stage 1 through stage 4 chronic kidney disease, or unspecified chronic kidney disease: Secondary | ICD-10-CM | POA: Insufficient documentation

## 2021-10-26 DIAGNOSIS — Z923 Personal history of irradiation: Secondary | ICD-10-CM | POA: Insufficient documentation

## 2021-10-26 DIAGNOSIS — E1122 Type 2 diabetes mellitus with diabetic chronic kidney disease: Secondary | ICD-10-CM | POA: Diagnosis not present

## 2021-10-26 DIAGNOSIS — R1310 Dysphagia, unspecified: Secondary | ICD-10-CM | POA: Diagnosis not present

## 2021-10-26 NOTE — Progress Notes (Signed)
Modified Barium Swallow Progress Note  Patient Details  Name: Brian [name] MRN: [id number] Date of Birth: August 21, 1955  Today's Date: 10/26/2021  Modified Barium Swallow completed.  Full report located under Chart Review in the Imaging Section.  Brief recommendations include the following:  Clinical Impression  Pt has a mild-moderate pharyngeal dysphagia suspected to be related to post-radiation changes with what appears to be edematous tissue, particularly noted at the epiglottis and arytenoids. He has reduced base of tongue retraction, hyolaryngeal excursion, epiglottic inversion, and laryngeal vestibule closure. Because he does not achieve full closure, all liquids and even purees are penetrated in small amounts, but can be cleared well with a cued throat clear. Thin liquid penetrates reach the level of the true vocal folds and do fall below, with aspiration that is sensed as evidenced by a spontaneous throat clear (PAS 7). This throat clear does not adequately move aspirates, but when he is cued to clear his throat harder, he ejects them from his airway completely. Other compensatory strategies, such as a supraglottic swallow or chin tuck, do not increase airway protection. Pt has increased residue in his valleculae with thicker liquids and foods, with residue most significant when given a bite of a cereal bar. This stickier texture left severe residue in his valleculae (almost the entire bolus), but he still maintained good control of it by bringing it back up into his mouth and trying to re-swallow it. He ultimately used a liquid wash to clear most of this residue. This happened to be the only time during the study that pt had any subjective c/o residue, and it was the most significant. Education was provided to pt with daughter also present. Given that penetration is likely with thicker liquids (and aspiration possible across a meal), there is increased residue, and pt has remained stable from a  pulmonary standpoint, recommend continuing with thin liquids in the setting of thorough oral care and implementation of a cough or more effortful throat clear post-swallow. Could consider making foods a little softer (mechanical soft) to facilitate pharyngeal clearance and subjective symptoms. Given that pt is reporting an acute change in swallowing, recommend f/u with OP SLP.   Swallow Evaluation Recommendations       SLP Diet Recommendations: Dysphagia 3 (Mech soft) solids;Thin liquid   Liquid Administration via: Cup;Straw   Medication Administration: Whole meds with puree   Supervision: Patient able to self feed   Compensations: Slow rate;Small sips/bites;Clear throat after each swallow;Hard cough after swallow   Postural Changes: Seated upright at 90 degrees   Oral Care Recommendations: Oral care BID        Osie Bond., M.A. [place] Office [phone]  Secure chat preferred  10/26/2021,1:44 PM

## 2021-11-02 ENCOUNTER — Ambulatory Visit: Payer: Medicare HMO | Admitting: Family Medicine

## 2021-11-03 ENCOUNTER — Other Ambulatory Visit: Payer: Self-pay

## 2021-11-03 ENCOUNTER — Emergency Department (HOSPITAL_COMMUNITY): Payer: Medicare HMO

## 2021-11-03 ENCOUNTER — Inpatient Hospital Stay (HOSPITAL_COMMUNITY)
Admission: EM | Admit: 2021-11-03 | Discharge: 2021-11-09 | DRG: 308 | Disposition: A | Discharge status: Home Health | Payer: Medicare HMO | Attending: Internal Medicine | Admitting: Internal Medicine

## 2021-11-03 ENCOUNTER — Encounter (HOSPITAL_COMMUNITY): Payer: Self-pay

## 2021-11-03 DIAGNOSIS — S41111A Laceration without foreign body of right upper arm, initial encounter: Secondary | ICD-10-CM | POA: Diagnosis present

## 2021-11-03 DIAGNOSIS — E876 Hypokalemia: Secondary | ICD-10-CM | POA: Diagnosis not present

## 2021-11-03 DIAGNOSIS — I69354 Hemiplegia and hemiparesis following cerebral infarction affecting left non-dominant side: Secondary | ICD-10-CM

## 2021-11-03 DIAGNOSIS — T461X6A Underdosing of calcium-channel blockers, initial encounter: Secondary | ICD-10-CM | POA: Diagnosis present

## 2021-11-03 DIAGNOSIS — W19XXXD Unspecified fall, subsequent encounter: Secondary | ICD-10-CM

## 2021-11-03 DIAGNOSIS — J189 Pneumonia, unspecified organism: Secondary | ICD-10-CM

## 2021-11-03 DIAGNOSIS — Z7984 Long term (current) use of oral hypoglycemic drugs: Secondary | ICD-10-CM

## 2021-11-03 DIAGNOSIS — Z800 Family history of malignant neoplasm of digestive organs: Secondary | ICD-10-CM | POA: Diagnosis not present

## 2021-11-03 DIAGNOSIS — W19XXXA Unspecified fall, initial encounter: Secondary | ICD-10-CM | POA: Diagnosis not present

## 2021-11-03 DIAGNOSIS — N179 Acute kidney failure, unspecified: Secondary | ICD-10-CM | POA: Diagnosis present

## 2021-11-03 DIAGNOSIS — Z20822 Contact with and (suspected) exposure to covid-19: Secondary | ICD-10-CM | POA: Diagnosis not present

## 2021-11-03 DIAGNOSIS — S0990XA Unspecified injury of head, initial encounter: Secondary | ICD-10-CM | POA: Diagnosis not present

## 2021-11-03 DIAGNOSIS — R58 Hemorrhage, not elsewhere classified: Secondary | ICD-10-CM | POA: Diagnosis not present

## 2021-11-03 DIAGNOSIS — R1310 Dysphagia, unspecified: Secondary | ICD-10-CM | POA: Diagnosis not present

## 2021-11-03 DIAGNOSIS — I5189 Other ill-defined heart diseases: Secondary | ICD-10-CM | POA: Diagnosis not present

## 2021-11-03 DIAGNOSIS — I499 Cardiac arrhythmia, unspecified: Secondary | ICD-10-CM | POA: Diagnosis not present

## 2021-11-03 DIAGNOSIS — R112 Nausea with vomiting, unspecified: Secondary | ICD-10-CM | POA: Diagnosis not present

## 2021-11-03 DIAGNOSIS — Z803 Family history of malignant neoplasm of breast: Secondary | ICD-10-CM | POA: Diagnosis not present

## 2021-11-03 DIAGNOSIS — R197 Diarrhea, unspecified: Secondary | ICD-10-CM | POA: Diagnosis not present

## 2021-11-03 DIAGNOSIS — N1832 Chronic kidney disease, stage 3b: Secondary | ICD-10-CM | POA: Diagnosis present

## 2021-11-03 DIAGNOSIS — E785 Hyperlipidemia, unspecified: Secondary | ICD-10-CM | POA: Diagnosis present

## 2021-11-03 DIAGNOSIS — I6930 Unspecified sequelae of cerebral infarction: Secondary | ICD-10-CM

## 2021-11-03 DIAGNOSIS — Z9221 Personal history of antineoplastic chemotherapy: Secondary | ICD-10-CM

## 2021-11-03 DIAGNOSIS — W06XXXA Fall from bed, initial encounter: Secondary | ICD-10-CM | POA: Diagnosis present

## 2021-11-03 DIAGNOSIS — B9620 Unspecified Escherichia coli [E. coli] as the cause of diseases classified elsewhere: Secondary | ICD-10-CM | POA: Diagnosis present

## 2021-11-03 DIAGNOSIS — I10 Essential (primary) hypertension: Secondary | ICD-10-CM | POA: Diagnosis present

## 2021-11-03 DIAGNOSIS — E1169 Type 2 diabetes mellitus with other specified complication: Secondary | ICD-10-CM

## 2021-11-03 DIAGNOSIS — R791 Abnormal coagulation profile: Secondary | ICD-10-CM | POA: Diagnosis present

## 2021-11-03 DIAGNOSIS — R000 Tachycardia, unspecified: Secondary | ICD-10-CM | POA: Diagnosis not present

## 2021-11-03 DIAGNOSIS — Z9181 History of falling: Secondary | ICD-10-CM

## 2021-11-03 DIAGNOSIS — E1122 Type 2 diabetes mellitus with diabetic chronic kidney disease: Secondary | ICD-10-CM | POA: Diagnosis present

## 2021-11-03 DIAGNOSIS — E113599 Type 2 diabetes mellitus with proliferative diabetic retinopathy without macular edema, unspecified eye: Secondary | ICD-10-CM | POA: Diagnosis present

## 2021-11-03 DIAGNOSIS — I4821 Permanent atrial fibrillation: Principal | ICD-10-CM | POA: Diagnosis present

## 2021-11-03 DIAGNOSIS — Z7901 Long term (current) use of anticoagulants: Secondary | ICD-10-CM

## 2021-11-03 DIAGNOSIS — R7881 Bacteremia: Secondary | ICD-10-CM | POA: Diagnosis present

## 2021-11-03 DIAGNOSIS — R296 Repeated falls: Secondary | ICD-10-CM | POA: Diagnosis present

## 2021-11-03 DIAGNOSIS — J690 Pneumonitis due to inhalation of food and vomit: Secondary | ICD-10-CM | POA: Diagnosis not present

## 2021-11-03 DIAGNOSIS — I129 Hypertensive chronic kidney disease with stage 1 through stage 4 chronic kidney disease, or unspecified chronic kidney disease: Secondary | ICD-10-CM | POA: Diagnosis present

## 2021-11-03 DIAGNOSIS — C01 Malignant neoplasm of base of tongue: Secondary | ICD-10-CM | POA: Diagnosis not present

## 2021-11-03 DIAGNOSIS — Z923 Personal history of irradiation: Secondary | ICD-10-CM | POA: Diagnosis not present

## 2021-11-03 DIAGNOSIS — Z91128 Patient's intentional underdosing of medication regimen for other reason: Secondary | ICD-10-CM

## 2021-11-03 DIAGNOSIS — Z85810 Personal history of malignant neoplasm of tongue: Secondary | ICD-10-CM

## 2021-11-03 DIAGNOSIS — I429 Cardiomyopathy, unspecified: Secondary | ICD-10-CM | POA: Diagnosis present

## 2021-11-03 DIAGNOSIS — E119 Type 2 diabetes mellitus without complications: Secondary | ICD-10-CM

## 2021-11-03 DIAGNOSIS — D61818 Other pancytopenia: Secondary | ICD-10-CM | POA: Diagnosis present

## 2021-11-03 DIAGNOSIS — I4891 Unspecified atrial fibrillation: Principal | ICD-10-CM

## 2021-11-03 DIAGNOSIS — Y92009 Unspecified place in unspecified non-institutional (private) residence as the place of occurrence of the external cause: Secondary | ICD-10-CM

## 2021-11-03 DIAGNOSIS — I480 Paroxysmal atrial fibrillation: Secondary | ICD-10-CM | POA: Diagnosis not present

## 2021-11-03 DIAGNOSIS — Z79899 Other long term (current) drug therapy: Secondary | ICD-10-CM | POA: Diagnosis not present

## 2021-11-03 DIAGNOSIS — R531 Weakness: Secondary | ICD-10-CM | POA: Diagnosis not present

## 2021-11-03 DIAGNOSIS — G9389 Other specified disorders of brain: Secondary | ICD-10-CM | POA: Diagnosis not present

## 2021-11-03 DIAGNOSIS — N189 Chronic kidney disease, unspecified: Secondary | ICD-10-CM | POA: Diagnosis not present

## 2021-11-03 DIAGNOSIS — I639 Cerebral infarction, unspecified: Secondary | ICD-10-CM | POA: Diagnosis not present

## 2021-11-03 DIAGNOSIS — R55 Syncope and collapse: Secondary | ICD-10-CM | POA: Diagnosis not present

## 2021-11-03 DIAGNOSIS — Y92003 Bedroom of unspecified non-institutional (private) residence as the place of occurrence of the external cause: Secondary | ICD-10-CM | POA: Diagnosis not present

## 2021-11-03 DIAGNOSIS — J3489 Other specified disorders of nose and nasal sinuses: Secondary | ICD-10-CM | POA: Diagnosis not present

## 2021-11-03 DIAGNOSIS — R059 Cough, unspecified: Secondary | ICD-10-CM | POA: Diagnosis not present

## 2021-11-03 DIAGNOSIS — Z8249 Family history of ischemic heart disease and other diseases of the circulatory system: Secondary | ICD-10-CM

## 2021-11-03 LAB — CBC WITH DIFFERENTIAL/PLATELET
Abs Immature Granulocytes: 0.07 10*3/uL (ref 0.00–0.07)
Basophils Absolute: 0 10*3/uL (ref 0.0–0.1)
Basophils Relative: 0 %
Eosinophils Absolute: 0 10*3/uL (ref 0.0–0.5)
Eosinophils Relative: 0 %
HCT: 35.8 % — ABNORMAL LOW (ref 39.0–52.0)
Hemoglobin: 11.3 g/dL — ABNORMAL LOW (ref 13.0–17.0)
Immature Granulocytes: 1 %
Lymphocytes Relative: 5 %
Lymphs Abs: 0.4 10*3/uL — ABNORMAL LOW (ref 0.7–4.0)
MCH: 26.5 pg (ref 26.0–34.0)
MCHC: 31.6 g/dL (ref 30.0–36.0)
MCV: 83.8 fL (ref 80.0–100.0)
Monocytes Absolute: 0.7 10*3/uL (ref 0.1–1.0)
Monocytes Relative: 9 %
Neutro Abs: 6.8 10*3/uL (ref 1.7–7.7)
Neutrophils Relative %: 85 %
Platelets: 147 10*3/uL — ABNORMAL LOW (ref 150–400)
RBC: 4.27 MIL/uL (ref 4.22–5.81)
RDW: 15.2 % (ref 11.5–15.5)
WBC: 7.9 10*3/uL (ref 4.0–10.5)
nRBC: 0 % (ref 0.0–0.2)

## 2021-11-03 LAB — I-STAT CHEM 8, ED
BUN: 23 mg/dL (ref 8–23)
Calcium, Ion: 1.06 mmol/L — ABNORMAL LOW (ref 1.15–1.40)
Chloride: 97 mmol/L — ABNORMAL LOW (ref 98–111)
Creatinine, Ser: 2 mg/dL — ABNORMAL HIGH (ref 0.61–1.24)
Glucose, Bld: 148 mg/dL — ABNORMAL HIGH (ref 70–99)
HCT: 37 % — ABNORMAL LOW (ref 39.0–52.0)
Hemoglobin: 12.6 g/dL — ABNORMAL LOW (ref 13.0–17.0)
Potassium: 2.6 mmol/L — CL (ref 3.5–5.1)
Sodium: 135 mmol/L (ref 135–145)
TCO2: 23 mmol/L (ref 22–32)

## 2021-11-03 LAB — CBG MONITORING, ED: Glucose-Capillary: 137 mg/dL — ABNORMAL HIGH (ref 70–99)

## 2021-11-03 LAB — PROTIME-INR
INR: 4 — ABNORMAL HIGH (ref 0.8–1.2)
Prothrombin Time: 38.9 seconds — ABNORMAL HIGH (ref 11.4–15.2)

## 2021-11-03 LAB — COMPREHENSIVE METABOLIC PANEL
ALT: 12 U/L (ref 0–44)
AST: 16 U/L (ref 15–41)
Albumin: 3.3 g/dL — ABNORMAL LOW (ref 3.5–5.0)
Alkaline Phosphatase: 65 U/L (ref 38–126)
Anion gap: 18 — ABNORMAL HIGH (ref 5–15)
BUN: 26 mg/dL — ABNORMAL HIGH (ref 8–23)
CO2: 23 mmol/L (ref 22–32)
Calcium: 9.1 mg/dL (ref 8.9–10.3)
Chloride: 95 mmol/L — ABNORMAL LOW (ref 98–111)
Creatinine, Ser: 2.26 mg/dL — ABNORMAL HIGH (ref 0.61–1.24)
GFR, Estimated: 31 mL/min — ABNORMAL LOW (ref >60)
Glucose, Bld: 150 mg/dL — ABNORMAL HIGH (ref 70–99)
Potassium: 2.6 mmol/L — CL (ref 3.5–5.1)
Sodium: 136 mmol/L (ref 135–145)
Total Bilirubin: 0.8 mg/dL (ref 0.3–1.2)
Total Protein: 6.5 g/dL (ref 6.5–8.1)

## 2021-11-03 LAB — GLUCOSE, CAPILLARY
Glucose-Capillary: 177 mg/dL — ABNORMAL HIGH (ref 70–99)
Glucose-Capillary: 232 mg/dL — ABNORMAL HIGH (ref 70–99)

## 2021-11-03 LAB — HEMOGLOBIN A1C
Hgb A1c MFr Bld: 6.6 % — ABNORMAL HIGH (ref 4.8–5.6)
Mean Plasma Glucose: 142.72 mg/dL

## 2021-11-03 LAB — CK: Total CK: 104 U/L (ref 49–397)

## 2021-11-03 LAB — MAGNESIUM: Magnesium: 1.7 mg/dL (ref 1.7–2.4)

## 2021-11-03 LAB — PROCALCITONIN: Procalcitonin: 0.86 ng/mL

## 2021-11-03 LAB — HIV ANTIBODY (ROUTINE TESTING W REFLEX): HIV Screen 4th Generation wRfx: NONREACTIVE

## 2021-11-03 LAB — RESP PANEL BY RT-PCR (FLU A&B, COVID) ARPGX2
Influenza A by PCR: NEGATIVE
Influenza B by PCR: NEGATIVE
SARS Coronavirus 2 by RT PCR: NEGATIVE

## 2021-11-03 LAB — TROPONIN I (HIGH SENSITIVITY): Troponin I (High Sensitivity): 58 ng/L — ABNORMAL HIGH (ref <18)

## 2021-11-03 LAB — MRSA NEXT GEN BY PCR, NASAL: MRSA by PCR Next Gen: NOT DETECTED

## 2021-11-03 MED ORDER — SODIUM CHLORIDE 0.9 % IV SOLN
500.0000 mg | INTRAVENOUS | Status: DC
  Administered 2021-11-03 – 2021-11-07 (×5): 500 mg via INTRAVENOUS
  Filled 2021-11-03 (×6): qty 5

## 2021-11-03 MED ORDER — SODIUM CHLORIDE 0.9 % IV SOLN
1.0000 g | Freq: Once | INTRAVENOUS | Status: AC
  Administered 2021-11-03: 1 g via INTRAVENOUS
  Filled 2021-11-03: qty 10

## 2021-11-03 MED ORDER — DILTIAZEM HCL 60 MG PO TABS
120.0000 mg | ORAL_TABLET | Freq: Two times a day (BID) | ORAL | Status: AC
  Administered 2021-11-03: 120 mg via ORAL

## 2021-11-03 MED ORDER — POTASSIUM CHLORIDE 10 MEQ/100ML IV SOLN
10.0000 meq | INTRAVENOUS | Status: AC
  Administered 2021-11-03 (×2): 10 meq via INTRAVENOUS
  Filled 2021-11-03 (×2): qty 100

## 2021-11-03 MED ORDER — DILTIAZEM HCL 25 MG/5ML IV SOLN
10.0000 mg | Freq: Once | INTRAVENOUS | Status: AC
Start: 2021-11-03 — End: 2021-11-03
  Administered 2021-11-03: 10 mg via INTRAVENOUS
  Filled 2021-11-03: qty 5

## 2021-11-03 MED ORDER — MAGNESIUM SULFATE 2 GM/50ML IV SOLN
2.0000 g | Freq: Once | INTRAVENOUS | Status: AC
  Administered 2021-11-03: 2 g via INTRAVENOUS
  Filled 2021-11-03: qty 50

## 2021-11-03 MED ORDER — POTASSIUM CHLORIDE CRYS ER 20 MEQ PO TBCR
60.0000 meq | EXTENDED_RELEASE_TABLET | ORAL | Status: AC
  Administered 2021-11-03: 60 meq via ORAL
  Filled 2021-11-03: qty 3

## 2021-11-03 MED ORDER — CARVEDILOL 25 MG PO TABS
25.0000 mg | ORAL_TABLET | Freq: Two times a day (BID) | ORAL | Status: DC
  Administered 2021-11-03 – 2021-11-04 (×3): 25 mg via ORAL
  Filled 2021-11-03 (×2): qty 1
  Filled 2021-11-03: qty 2

## 2021-11-03 MED ORDER — SODIUM CHLORIDE 0.9 % IV SOLN
2.0000 g | INTRAVENOUS | Status: DC
  Administered 2021-11-04 – 2021-11-09 (×6): 2 g via INTRAVENOUS
  Filled 2021-11-03 (×6): qty 20

## 2021-11-03 MED ORDER — GUAIFENESIN ER 600 MG PO TB12
600.0000 mg | ORAL_TABLET | Freq: Two times a day (BID) | ORAL | Status: DC
  Administered 2021-11-03 – 2021-11-07 (×10): 600 mg via ORAL
  Filled 2021-11-03 (×11): qty 1

## 2021-11-03 MED ORDER — SODIUM CHLORIDE 0.9% FLUSH
3.0000 mL | Freq: Two times a day (BID) | INTRAVENOUS | Status: DC
  Administered 2021-11-03 – 2021-11-08 (×11): 3 mL via INTRAVENOUS

## 2021-11-03 MED ORDER — DILTIAZEM HCL 60 MG PO TABS
120.0000 mg | ORAL_TABLET | Freq: Once | ORAL | Status: AC
  Administered 2021-11-03: 120 mg via ORAL
  Filled 2021-11-03 (×2): qty 2

## 2021-11-03 MED ORDER — ACETAMINOPHEN 650 MG RE SUPP: 650.0000 mg | Freq: Four times a day (QID) | RECTAL | Status: DC | PRN

## 2021-11-03 MED ORDER — ATORVASTATIN CALCIUM 10 MG PO TABS
20.0000 mg | ORAL_TABLET | Freq: Every day | ORAL | Status: DC
  Administered 2021-11-03 – 2021-11-09 (×7): 20 mg via ORAL
  Filled 2021-11-03 (×7): qty 2

## 2021-11-03 MED ORDER — INSULIN ASPART 100 UNIT/ML IJ SOLN
0.0000 [IU] | Freq: Three times a day (TID) | INTRAMUSCULAR | Status: DC
  Administered 2021-11-03: 1 [IU] via SUBCUTANEOUS
  Administered 2021-11-03: 2 [IU] via SUBCUTANEOUS
  Administered 2021-11-04 (×2): 1 [IU] via SUBCUTANEOUS
  Administered 2021-11-04: 5 [IU] via SUBCUTANEOUS
  Administered 2021-11-05: 3 [IU] via SUBCUTANEOUS
  Administered 2021-11-05 (×2): 2 [IU] via SUBCUTANEOUS
  Administered 2021-11-06: 1 [IU] via SUBCUTANEOUS
  Administered 2021-11-06 – 2021-11-07 (×3): 2 [IU] via SUBCUTANEOUS
  Administered 2021-11-07 (×2): 1 [IU] via SUBCUTANEOUS
  Administered 2021-11-08 (×2): 2 [IU] via SUBCUTANEOUS
  Administered 2021-11-08: 1 [IU] via SUBCUTANEOUS
  Administered 2021-11-09: 2 [IU] via SUBCUTANEOUS

## 2021-11-03 MED ORDER — SODIUM CHLORIDE 0.9 % IV BOLUS
1000.0000 mL | Freq: Once | INTRAVENOUS | Status: AC
  Administered 2021-11-03: 1000 mL via INTRAVENOUS

## 2021-11-03 MED ORDER — ALBUTEROL SULFATE (2.5 MG/3ML) 0.083% IN NEBU: 2.5000 mg | INHALATION_SOLUTION | Freq: Four times a day (QID) | RESPIRATORY_TRACT | Status: DC | PRN

## 2021-11-03 MED ORDER — WARFARIN - PHARMACIST DOSING INPATIENT: Freq: Every day | Status: DC

## 2021-11-03 MED ORDER — DILTIAZEM HCL 60 MG PO TABS
120.0000 mg | ORAL_TABLET | Freq: Two times a day (BID) | ORAL | Status: DC
  Filled 2021-11-03: qty 2

## 2021-11-03 MED ORDER — INSULIN ASPART 100 UNIT/ML IJ SOLN
0.0000 [IU] | Freq: Every day | INTRAMUSCULAR | Status: DC
  Administered 2021-11-03: 2 [IU] via SUBCUTANEOUS

## 2021-11-03 MED ORDER — DILTIAZEM HCL 25 MG/5ML IV SOLN
10.0000 mg | Freq: Once | INTRAVENOUS | Status: AC
  Administered 2021-11-03: 10 mg via INTRAVENOUS
  Filled 2021-11-03: qty 5

## 2021-11-03 MED ORDER — POTASSIUM CHLORIDE CRYS ER 20 MEQ PO TBCR
40.0000 meq | EXTENDED_RELEASE_TABLET | Freq: Once | ORAL | Status: AC
  Administered 2021-11-03: 40 meq via ORAL
  Filled 2021-11-03: qty 2

## 2021-11-03 MED ORDER — ACETAMINOPHEN 325 MG PO TABS: 650.0000 mg | ORAL_TABLET | Freq: Four times a day (QID) | ORAL | Status: DC | PRN

## 2021-11-03 MED ORDER — DILTIAZEM HCL ER COATED BEADS 240 MG PO CP24
240.0000 mg | ORAL_CAPSULE | Freq: Every day | ORAL | Status: DC
  Administered 2021-11-04: 240 mg via ORAL
  Filled 2021-11-03: qty 1

## 2021-11-03 NOTE — Progress Notes (Signed)
RN paged Dr. Tamala Julian about pt heart in 110's. Pt heart has been as high as 150's. Per verbal RN to give 2200 dose of cardizem now if heart rate not decrease in hour to hour and half give 2200 dose of Coreg.

## 2021-11-03 NOTE — H&P (Addendum)
History and Physical    Patient: Brian [name] EGB:[id number] DOB: 21-Apr-1955 DOA: 11/03/2021 DOS: the patient was seen and examined on 11/03/2021 PCP: [name], MD  Patient coming from: Home via EMS  Chief Complaint:  Chief Complaint  Patient presents with   Emesis   Weakness   a-fib RVR   HPI: Brian [name] is a 66 y.o. 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  Patient states of the last 3 days he has diarrhea with large watery stools.  Symptoms initially started patient had nausea and 2 episodes of vomiting, but he states those symptoms have since resolved.  Emesis was reported to be nonbloody in appearance.  Denied any associated abdominal pain.  He has had a cough with some congestion.  Denies having any significant fevers, chest pain, or shortness of breath.  He feels globally weak and has had at least 2 falls at home.  Once occurred yesterday afternoon where he was on the floor for approximately 4 hours before being able to get up, and this morning at around 1 AM he fell out of bed again.  Normally patient ambulates without use of assistance.  He reports that he has been taking all of his medicines except for the diltiazem, but is not able to tell me why he only did not take diltazem as he states he still has a few pills left.   Upon admission into the emergency department patient was noted to be afebrile in atrial fibrillation with heart rates elevated into the 160s, respirations 19-24, and all other vital signs relatively maintained.  Labs significant for hemoglobin 11.3, potassium 2.6, BUN 26, creatinine 2.26, anion gap 18, and INR 4.  Chest x-ray noted patchy airspace opacity of the left lower lung.  Patient has been given 1 L  normal saline IV fluids, Cardizem 10 mg IV followed with Cardizem 120 mg p.o., and potassium chloride 20 mEq IV followed by 40 mEq p.o.  Review of Systems: As mentioned in the history of present illness. All other systems reviewed and are negative. 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 Zigmund Daniel, laser therapy OD   Seizures ([place])    pt reports this is from low blood sugar   Past Surgical History:  Procedure Laterality Date   COLONOSCOPY     DIRECT LARYNGOSCOPY N/A 02/10/2020   Procedure: DIRECT LARYNGOSCOPY WITH BIOPSY;  Surgeon: [name], MD;  Location: [place];  Service: ENT;  Laterality: N/A;   Ear surgery as a child     IR GASTROSTOMY TUBE MOD SED  03/23/2020   IR GASTROSTOMY TUBE REMOVAL  06/01/2020   IR IMAGING GUIDED PORT INSERTION  03/09/2020   IR REMOVAL TUN ACCESS W/ PORT W/O FL MOD SED  02/08/2021   TONSILLECTOMY     Social History:  reports that he has never smoked. He has never used smokeless tobacco. He reports current alcohol use. He reports  current drug use. Frequency: 7.00 times per week.  Allergies  Allergen Reactions   Codeine Other (See Comments)    Unknown reaction, severe headach   Metformin And Related Diarrhea    Family History  Problem Relation Age of Onset   Colon cancer Mother    Hyperlipidemia Father    Hypertension Father    Breast cancer Sister    Coronary artery disease Neg Hx        no early CAD.   Stomach cancer Neg Hx    Rectal cancer Neg Hx    Esophageal cancer Neg Hx     Prior to Admission medications   Medication Sig Start Date End Date Taking? Authorizing Provider  atorvastatin (LIPITOR) 20 MG tablet TAKE 1 TABLET BY MOUTH EVERY DAY 05/15/21   [name], MD  Blood Glucose Monitoring Suppl (ONETOUCH VERIO IQ SYSTEM) w/Device KIT 1 each by Does not apply route 2 (two) times daily. Patient not taking: Reported on 09/18/2021 03/10/20   [name], MD  carvedilol (COREG) 25 MG tablet Take 1 tablet (25 mg total) by mouth 2 (two) times daily. Patient taking differently: Take 25 mg by mouth 2 (two) times daily. Take 1.5 Tablets Twice Daily. 02/22/21 09/14/21  Minus Breeding, MD  Continuous Blood Gluc Sensor (FREESTYLE LIBRE 2 SENSOR) MISC Use as directed to monitor blood glucose continuously. Change sensor Q 14 days. DX E11.65 Patient not taking: Reported on 09/18/2021 04/11/20   [name], MD  diltiazem (CARDIZEM LA) 240 MG 24 hr tablet Take 1 tablet (240 mg total) by mouth daily. 09/21/21   [name], MD  diltiazem (CARTIA XT) 240 MG 24 hr capsule Take 1 capsule (240 mg total) by mouth daily. 09/13/21   [name], MD  empagliflozin (JARDIANCE) 25 MG TABS tablet Take 1 tablet (25 mg total) by mouth daily. 09/11/21   [name], MD  ferrous sulfate 325 (65 FE) MG EC tablet Take 325 mg by mouth 3 (three) times daily with meals.    [provider]  furosemide (LASIX) 40 MG tablet Take 1 tablet (40 mg total) by mouth as needed for fluid or edema. Patient not taking: Reported on 09/18/2021 09/14/21   Caron Presume K, PA-C  glucose blood test strip 1 each by Other route as needed for other. Use as instructed Patient not taking: Reported on 09/18/2021 03/10/20   [name], MD  hydrALAZINE (APRESOLINE) 50 MG tablet TAKE 1 TABLET BY MOUTH THREE TIMES A DAY 07/14/21   [name], MD  losartan (COZAAR) 100 MG tablet TAKE 1 TABLET BY MOUTH EVERY DAY 10/06/21   [name], MD  warfarin (COUMADIN) 10 MG tablet Take 1 tablet (10 mg total) by mouth daily. Patient taking differently: Take 10 mg by mouth daily. Pt takes 5 mg four times a week and 10 mg 3 times a week 09/11/21   [name], MD    Physical  Exam: Vitals:   11/03/21 0812 11/03/21 0933 11/03/21 0945 11/03/21 1000  BP: 99/73 (!) 138/96 (!) 123/96   Pulse: (!) 161 95  (!) 113  Resp: (!) 24 (!) _0 Temp: 98.2 F (36.8 C) 98.3 F (36.8 C)    TempSrc: Oral Oral    SpO2: 98% 98% 96% 98%  Weight:      Height:       Exam  Constitutional: Elderly male who appears to be in no acute distress able to  follow commands. Eyes: PERRL, lids and conjunctivae normal ENMT: Mucous membranes are dry with poor dentition. Neck: normal, supple Respiratory: Normal respiratory effort without significant wheezes or rhonchi appreciated.  Intermittent productive sounding cough appreciated Cardiovascular: Irregular irregular and tachycardic. No extremity edema.   Abdomen: no tenderness, no masses palpated.   Bowel sounds positive.  Musculoskeletal: Present.  Contracture noted of the right upper extremity. Skin: Abrasion to the lower right elbow. Neurologic: CN 2-12 grossly intact.  Decreased strength in the right upper extremity.  Strength appears to be 5/5 in all other extremities. Psychiatric: Normal judgment and insight. Alert and oriented x 3. Normal mood.   Data Reviewed:  EKG significant for atrial fibrillation with RVR in the 164 bpm.  Reviewed labs, imaging, and pertinent records as noted above in HPI.  Assessment and Plan:  Atrial fibrillation with RVR supratherapeutic INR On admission patient noted to be in A-fib with RVR with heart rates into the 160s.  INR supratherapeutic at 4.  Patient had initially been given Cardizem dose of 120 mg x 1 dose and Cardizem 10 mg IV. -Coumadin per pharmacy -Goal to maintain potassium at least 4 and magnesium at least 2.  Replacing electrolytes as needed -resume Coreg and Cardizem p.o. -Continue to monitor and consider need of placing patient on a Cardizem drip if heart rates do not appear to be under better control  Hypokalemia Acute.  Initial potassium 2.6.  Patient had been given a total of  60 mEq of potassium chloride in the ED. thought secondary to patient's reports of nausea, vomiting, and diarrhea. -Given additional 60 mEq of potassium chloride p.o. -Continue to monitor and replace as needed  Suspected aspiration pneumonia dysphagia Patient admitted to having a intermittent cough and complaints of getting choked up with eating. Noted to have a left lower lobe opacity on chest x-ray.  Dysphagia thought possibly related to prior radiation treatments for cancer and/or CVA affecting left side.  Patient had been started on Rocephin and azithromycin.  Patient just had modified barium swallow with recommendations of a dysphagia 3 diet with thin liquids and meds with pure on 9/7. -Aspiration precautions -Elevation head of bed -Incentive spirometry and flutter valve -Check procalcitonin -Dysphagia 3 diet -Continue Rocephin and azithromycin -mucinex  Falls at home, initial encounter Patient noted least having 2 falls while at home which she relates to generalized weakness.  At baseline patient does not use any assistive device to get around although has prior history of stroke.  CT scan of the head did not note any acute abnormalities. -PT/OT to evaluate and treat  Acute kidney injury superimposed on chronic kidney disease stage IIIb On admission creatinine elevated up to 2.26 with BUN 26.  Baseline creatinine previously noted to be 1.73 back in July.  Suspect secondary to dehydration and hypoperfusion in setting of rapid A-fib.  Patient has been given 1 L of IV fluids.  Question possibility of rhabdomyolysis given his recent falls and reports of him laying on the floor for our. -Check urinalysis -Check CK -Avoid nephrotoxic agents at this time -Recheck kidney function in a.m.  Nausea, vomiting, and diarrhea Patient reports symptoms have since resolved.  Denied any recent antibiotics being given or any abdominal pain.  Possibly secondary to a viral gastroenteritis. -Monitor  intake and output  Essential hypertension Home blood pressure regimen includes Coreg 25 mg twice daily, Cardizem long-acting 240 mg daily, hydralazine 50 mg 3 times daily, and losartan 100 mg daily. -Continue Coreg and Cardizem -Consider resuming  hydralazine and losartan if vitals remain stable  Diastolic dysfunction Chronic.  Last EF noted to be 55-60% with grade 2 diastolic dysfunction by echocardiogram from 03/07/2021.  Patient does not appear grossly fluid overloaded at this time. -Strict I&Os and daily weights hypokalemia hypokalemia  Diabetes mellitus type 2, Last hemoglobin A1c 7.3 in 05/2021.  Home medication regimen includes Actos 30 mg daily and Jardiance 25 mg daily. -Hypoglycemic protocols -Hold home glucose regimen -Add on hemoglobin A1c -CBGs before every meal with sensitive SSI -Adjust regimen as needed  History of CVA with residual deficit Patient with prior history of stroke with residual left upper extremity weakness.  History of squamous cell carcinoma of base of the tongue Prior treatment with 7 weeks of radiation and chemotherapy.  Pancytopenia Acute.  On admission hemoglobin 11.3 with platelet count 147.  Platelet count previously been within normal limits and hemoglobin is higher than prior check. -Continue to monitor   DVT prophylaxis: Coumadin Advance Care Planning:   Code Status: Full Code    Consults: None  Family Communication: Sister updated at bedside  Severity of Illness: The appropriate patient status for this patient is INPATIENT. Inpatient status is judged to be reasonable and necessary in order to provide the required intensity of service to ensure the patient's safety. The patient's presenting symptoms, physical exam findings, and initial radiographic and laboratory data in the context of their chronic comorbidities is felt to place them at high risk for further clinical deterioration. Furthermore, it is not anticipated that the patient will be  medically stable for discharge from the hospital within 2 midnights of admission.   * I certify that at the point of admission it is my clinical judgment that the patient will require inpatient hospital care spanning beyond 2 midnights from the point of admission due to high intensity of service, high risk for further deterioration and high frequency of surveillance required.*  Author: Norval Morton, MD 11/03/2021 10:12 AM  For on call review [email or web address].

## 2021-11-03 NOTE — TOC Progression Note (Signed)
Transition of Care [facility]) - Progression Note    Patient Details  Name: Brian [name] MRN: [id number] Date of Birth: April 14, 1955  Transition of Care [facility]) CM/SW [place], RN Phone Number:[phone]  11/03/2021, 4:23 PM  Clinical Narrative:    [facility] acknowledges general consult for SNF/Home Health / DME Needs. TOC will continue to follow for disposition needs.         Expected Discharge Plan and Services                                                 Social Determinants of Health (SDOH) Interventions    Readmission Risk Interventions     No data to display

## 2021-11-03 NOTE — ED Triage Notes (Signed)
Pt BIB [facility] from home c/o N/V/D x3 days. Pt denies any abdominal pain. When EMS attached the pt to the monitor he was found to be in a-fib RVR with rates between 80-180. Pt does have a hx of a-fib per EMS.

## 2021-11-03 NOTE — ED Provider Notes (Signed)
[facility] EMERGENCY DEPARTMENT Provider Note   CSN: [id number] Arrival date & time: 11/03/21  0802     History  Chief Complaint  Patient presents with   Emesis   Weakness   a-fib RVR    Brian [name] is a 66 y.o. male.  Patient is a 66 year old male with a history of atrial fibrillation on Coumadin, prior squamous cell carcinoma of the tongue, diabetes, hypertension, hyperlipidemia, prior CVA with left-sided weakness and seizures who presents with diarrhea.  He states for the last 3 days he has had a large amount of watery diarrhea.  He initially had some nausea and vomiting but has not had that recently.  No associated abdominal pain.  He has had a little bit of cough and chest congestion.  No shortness of breath or chest discomfort.  No known fevers.  He says he is feeling generally weak all over.  He has had a couple falls related to this weakness.  He has a skin tear on his right arm from a fall.  He denies any other tenderness.  He does state he hit his head at 1 point but says it was very minor.  He does not report any neck or back pain.  No other injuries from the falls.  Of note he is on Coumadin for his A-fib.  He is noted to have a rapid heart rate.  He says he has not taken his diltiazem for the last 3 to 4 days.  He says he has been taking his other medications including his Coumadin.  He says he feels like he may be out of his diltiazem.       Home Medications Prior to Admission medications   Medication Sig Start Date End Date Taking? Authorizing Provider  atorvastatin (LIPITOR) 20 MG tablet TAKE 1 TABLET BY MOUTH EVERY DAY 05/15/21   [name], MD  Blood Glucose Monitoring Suppl (ONETOUCH VERIO IQ SYSTEM) w/Device KIT 1 each by Does not apply route 2 (two) times daily. Patient not taking: Reported on 09/18/2021 03/10/20   [name], MD  carvedilol (COREG) 25 MG tablet Take 1 tablet (25 mg total) by mouth 2 (two) times daily. Patient  taking differently: Take 25 mg by mouth 2 (two) times daily. Take 1.5 Tablets Twice Daily. 02/22/21 09/14/21  Minus Breeding, MD  Continuous Blood Gluc Sensor (FREESTYLE LIBRE 2 SENSOR) MISC Use as directed to monitor blood glucose continuously. Change sensor Q 14 days. DX E11.65 Patient not taking: Reported on 09/18/2021 04/11/20   [name], MD  diltiazem (CARDIZEM LA) 240 MG 24 hr tablet Take 1 tablet (240 mg total) by mouth daily. 09/21/21   [name], MD  diltiazem (CARTIA XT) 240 MG 24 hr capsule Take 1 capsule (240 mg total) by mouth daily. 09/13/21   [name], MD  empagliflozin (JARDIANCE) 25 MG TABS tablet Take 1 tablet (25 mg total) by mouth daily. 09/11/21   [name], MD  ferrous sulfate 325 (65 FE) MG EC tablet Take 325 mg by mouth 3 (three) times daily with meals.    [provider]  furosemide (LASIX) 40 MG tablet Take 1 tablet (40 mg total) by mouth as needed for fluid or edema. Patient not taking: Reported on 09/18/2021 09/14/21   Caron Presume K, PA-C  glucose blood test strip 1 each by Other route as needed for other. Use as instructed Patient not taking: Reported on 09/18/2021 03/10/20   Pickard,  Cammie Mcgee, MD  hydrALAZINE (APRESOLINE) 50 MG tablet TAKE 1 TABLET BY MOUTH THREE TIMES A DAY 07/14/21   [name], MD  losartan (COZAAR) 100 MG tablet TAKE 1 TABLET BY MOUTH EVERY DAY 10/06/21   [name], MD  pioglitazone (ACTOS) 30 MG tablet Take 30 mg by mouth daily. 09/16/21   [provider]  warfarin (COUMADIN) 10 MG tablet Take 1 tablet (10 mg total) by mouth daily. Patient taking differently: Take 10 mg by mouth daily. Pt takes 5 mg four times a week and 10 mg 3 times a week 09/11/21   [name], MD      Allergies    Codeine and Metformin and related    Review of Systems   Review of Systems  Constitutional:  Positive for fatigue. Negative for chills, diaphoresis and fever.  HENT:  Negative for congestion,  rhinorrhea and sneezing.   Eyes: Negative.   Respiratory:  Positive for cough. Negative for chest tightness and shortness of breath.   Cardiovascular:  Negative for chest pain and leg swelling.  Gastrointestinal:  Positive for diarrhea. Negative for abdominal pain, blood in stool, nausea and vomiting.  Genitourinary:  Negative for difficulty urinating, flank pain, frequency and hematuria.  Musculoskeletal:  Negative for arthralgias and back pain.  Skin:  Negative for rash.  Neurological:  Positive for weakness (Generalized). Negative for dizziness, speech difficulty, numbness and headaches.    Physical Exam Updated Vital Signs BP (!) 123/96   Pulse (!) 155   Temp 98.3 F (36.8 C) (Oral)   Resp (!) 23   Ht 5' 9"  (1.753 m)   Wt 85.7 kg   SpO2 98%   BMI 27.91 kg/m  Physical Exam Constitutional:      Appearance: He is well-developed.  HENT:     Head: Normocephalic and atraumatic.  Eyes:     Pupils: Pupils are equal, round, and reactive to light.  Cardiovascular:     Rate and Rhythm: Tachycardia present. Rhythm irregular.     Heart sounds: Normal heart sounds.  Pulmonary:     Effort: Pulmonary effort is normal. No respiratory distress.     Breath sounds: Normal breath sounds. No wheezing or rales.  Chest:     Chest wall: No tenderness.  Abdominal:     General: Bowel sounds are normal.     Palpations: Abdomen is soft.     Tenderness: There is no abdominal tenderness. There is no guarding or rebound.  Musculoskeletal:        General: Normal range of motion.     Cervical back: Normal range of motion and neck supple.     Comments: Small skin tear overlying his right elbow.  There is no underlying bony tenderness.  No other pain on palpation or range of motion of the extremities.  Lymphadenopathy:     Cervical: No cervical adenopathy.  Skin:    General: Skin is warm and dry.     Findings: No rash.  Neurological:     General: No focal deficit present.     Mental Status: He  is alert and oriented to person, place, and time.     Comments: He has some weakness in his left arm from a prior stroke.  He has good strength in his other extremities.  He says his left weakness is baseline and unchanged.     ED Results / Procedures / Treatments   Labs (all labs ordered are listed, but only abnormal results are  displayed) Labs Reviewed  COMPREHENSIVE METABOLIC PANEL - Abnormal; Notable for the following components:      Result Value   Potassium 2.6 (*)    Chloride 95 (*)    Glucose, Bld 150 (*)    BUN 26 (*)    Creatinine, Ser 2.26 (*)    Albumin 3.3 (*)    GFR, Estimated 31 (*)    Anion gap 18 (*)    All other components within normal limits  CBC WITH DIFFERENTIAL/PLATELET - Abnormal; Notable for the following components:   Hemoglobin 11.3 (*)    HCT 35.8 (*)    Platelets 147 (*)    Lymphs Abs 0.4 (*)    All other components within normal limits  PROTIME-INR - Abnormal; Notable for the following components:   Prothrombin Time 38.9 (*)    INR 4.0 (*)    All other components within normal limits  I-STAT CHEM 8, ED - Abnormal; Notable for the following components:   Potassium 2.6 (*)    Chloride 97 (*)    Creatinine, Ser 2.00 (*)    Glucose, Bld 148 (*)    Calcium, Ion 1.06 (*)    Hemoglobin 12.6 (*)    HCT 37.0 (*)    All other components within normal limits  RESP PANEL BY RT-PCR (FLU A&B, COVID) ARPGX2  MAGNESIUM  HIV ANTIBODY (ROUTINE TESTING W REFLEX)  PROCALCITONIN    EKG EKG Interpretation  Date/Time:  Friday November 03 2021 08:11:20 EDT Ventricular Rate:  164 PR Interval:    QRS Duration: 99 QT Interval:  318 QTC Calculation: 526 R Axis:   -67 Text Interpretation: Atrial fibrillation with rapid V-rate Inferior infarct, old Anterior infarct, old Lateral leads are also involved Confirmed by Malvin Johns [phone]) on 11/03/2021 8:37:40 AM  Radiology CT Head Wo Contrast  Result Date: 11/03/2021 CLINICAL DATA:  Head trauma, minor (Age  >= 65y) EXAM: CT HEAD WITHOUT CONTRAST TECHNIQUE: Contiguous axial images were obtained from the base of the skull through the vertex without intravenous contrast. RADIATION DOSE REDUCTION: This exam was performed according to the departmental dose-optimization program which includes automated exposure control, adjustment of the mA and/or kV according to patient size and/or use of iterative reconstruction technique. COMPARISON:  Head CT 01/15/2013 FINDINGS: Brain: No evidence of acute intracranial hemorrhage or abnormal extra-axial collection.There is encephalomalacia in the right parietal into a lesser degree the left parietal lobe consistent with chronic infarct. Chronic left caudate infarct.Mild ex vacuo dilation of the lateral ventricles.Confluent white matter hypoattenuation compatible with chronic small-vessel ischemic disease. Vascular: Vascular calcifications.  No hyperdense vessel. Skull: Negative for skull fracture. Sinuses/Orbits: Mild paranasal sinus mucosal thickening. Right mastoid effusion. Other: None. IMPRESSION: No acute intracranial abnormality. Chronic parietal lobe infarcts, right greater than left. Chronic left caudate infarct. Advanced chronic small vessel ischemic disease. Electronically Signed   By: Maurine Simmering M.D.   On: 11/03/2021 09:24   DG Chest 2 View  Result Date: 11/03/2021 CLINICAL DATA:  cough EXAM: CHEST - 2 VIEW COMPARISON:  May 02, 2020, October 02, 2021 FINDINGS: The cardiomediastinal silhouette is unchanged in contour. No pleural effusion. No pneumothorax. LEFT lower lobe airspace opacity, nonspecific. Visualized abdomen is unremarkable. Minimal degenerative changes of the thoracic spine. IMPRESSION: LEFT lower lobe patchy airspace opacity. Differential considerations include infection, aspiration or atelectasis. Electronically Signed   By: Valentino Saxon M.D.   On: 11/03/2021 09:14    Procedures Procedures    Medications Ordered in ED Medications  potassium  chloride  10 mEq in 100 mL IVPB (10 mEq Intravenous New Bag/Given 11/03/21 1013)  diltiazem (CARDIZEM) tablet 120 mg (has no administration in time range)  diltiazem (CARDIZEM) injection 10 mg (has no administration in time range)  sodium chloride flush (NS) 0.9 % injection 3 mL (has no administration in time range)  acetaminophen (TYLENOL) tablet 650 mg (has no administration in time range)    Or  acetaminophen (TYLENOL) suppository 650 mg (has no administration in time range)  albuterol (PROVENTIL) (2.5 MG/3ML) 0.083% nebulizer solution 2.5 mg (has no administration in time range)  cefTRIAXone (ROCEPHIN) 1 g in sodium chloride 0.9 % 100 mL IVPB (has no administration in time range)  azithromycin (ZITHROMAX) 500 mg in sodium chloride 0.9 % 250 mL IVPB (has no administration in time range)  sodium chloride 0.9 % bolus 1,000 mL (0 mLs Intravenous Stopped 11/03/21 1025)  diltiazem (CARDIZEM) injection 10 mg (10 mg Intravenous Given 11/03/21 0931)  potassium chloride SA (KLOR-CON M) CR tablet 40 mEq (40 mEq Oral Given 11/03/21 3536)    ED Course/ Medical Decision Making/ A&P                           Medical Decision Making Amount and/or Complexity of Data Reviewed Labs: ordered. Radiology: ordered.  Risk Prescription drug management. Decision regarding hospitalization.   Patient is a 66 year old male who presents with diarrhea for the last few days.  He has not taken any recent antibiotics.  He does not have associate abdominal pain.  He is tachycardic on arrival with a rhythm of A-fib with RVR.  He has not taken his Cardizem in the last 3 days.  I feel this is a combination of dehydration and not taking his rate control medications.  He initially was given some IV Cardizem which helped control his rate.  He was given IV fluids.  His heart rate dropped in the low 100s but then bounced back up.  He was given another IV dose and an oral dose of his Cardizem.  His labs reveal a marked hypokalemia  with a potassium of 2.6.  He was started on potassium replacement.  His magnesium level is normal.  Chest x-ray which was interpreted by me and confirmed by the radiologist shows patchy infiltrate in the left lower lobe.  He was started on IV antibiotics.  Head CT shows no acute intracranial hemorrhage.  COVID test is pending.  I spoke with Dr. Tamala Julian he will admit the patient for further treatment.  CRITICAL CARE Performed by: Malvin Johns Total critical care time: 70 minutes Critical care time was exclusive of separately billable procedures and treating other patients. Critical care was necessary to treat or prevent imminent or life-threatening deterioration. Critical care was time spent personally by me on the following activities: development of treatment plan with patient and/or surrogate as well as nursing, discussions with consultants, evaluation of patient's response to treatment, examination of patient, obtaining history from patient or surrogate, ordering and performing treatments and interventions, ordering and review of laboratory studies, ordering and review of radiographic studies, pulse oximetry and re-evaluation of patient's condition.   Final Clinical Impression(s) / ED Diagnoses Final diagnoses:  Atrial fibrillation with RVR (HCC)  Diarrhea, unspecified type  Hypokalemia  Community acquired pneumonia of left lower lobe of lung    Rx / DC Orders ED Discharge Orders     None         Malvin Johns, MD 11/03/21 1035

## 2021-11-03 NOTE — Progress Notes (Signed)
ANTICOAGULATION CONSULT NOTE - Initial Consult  Pharmacy Consult for warfarin Indication: atrial fibrillation  Allergies  Allergen Reactions   Codeine Other (See Comments)    Unknown reaction, severe headach   Metformin And Related Diarrhea    Patient Measurements: Height: 5\' 9"  (175.3 cm) Weight: 85.7 kg (189 lb) IBW/kg (Calculated) : 70.7  Vital Signs: Temp: 98.3 F (36.8 C) (09/15 0933) Temp Source: Oral (09/15 0933) BP: 123/96 (09/15 0945) Pulse Rate: 155 (09/15 1025)  Labs: Recent Labs    11/03/21 0821 11/03/21 0849  HGB 11.3* 12.6*  HCT 35.8* 37.0*  PLT 147*  --   LABPROT 38.9*  --   INR 4.0*  --   CREATININE 2.26* 2.00*    Estimated Creatinine Clearance: 38.4 mL/min (A) (by C-G formula based on SCr of 2 mg/dL (H)).   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 Zigmund Daniel, laser therapy OD   Seizures ([place])    pt reports this is from low blood sugar   Assessment: 67 yom presented to the ED with weakness. He is on chronic warfarin for hx afib. INR is supratherapeutic today. Hgb and platelets are slightly low but no bleeding noted.   Goal of Therapy:  INR 2-3 Monitor platelets by anticoagulation protocol: Yes   Plan:  No warfarin today Daily INR  Dannika Hilgeman, Rande Lawman 11/03/2021,11:05 AM

## 2021-11-04 DIAGNOSIS — J690 Pneumonitis due to inhalation of food and vomit: Secondary | ICD-10-CM | POA: Diagnosis not present

## 2021-11-04 DIAGNOSIS — I480 Paroxysmal atrial fibrillation: Secondary | ICD-10-CM

## 2021-11-04 DIAGNOSIS — I5189 Other ill-defined heart diseases: Secondary | ICD-10-CM | POA: Diagnosis not present

## 2021-11-04 DIAGNOSIS — N179 Acute kidney failure, unspecified: Secondary | ICD-10-CM | POA: Diagnosis not present

## 2021-11-04 LAB — BLOOD CULTURE ID PANEL (REFLEXED) - BCID2

## 2021-11-04 LAB — CBC
HCT: 29 % — ABNORMAL LOW (ref 39.0–52.0)
Hemoglobin: 9.8 g/dL — ABNORMAL LOW (ref 13.0–17.0)
MCH: 27.5 pg (ref 26.0–34.0)
MCHC: 33.8 g/dL (ref 30.0–36.0)
MCV: 81.2 fL (ref 80.0–100.0)
Platelets: 106 10*3/uL — ABNORMAL LOW (ref 150–400)
RBC: 3.57 MIL/uL — ABNORMAL LOW (ref 4.22–5.81)
RDW: 15.2 % (ref 11.5–15.5)
WBC: 5.6 10*3/uL (ref 4.0–10.5)
nRBC: 0 % (ref 0.0–0.2)

## 2021-11-04 LAB — BASIC METABOLIC PANEL
Anion gap: 14 (ref 5–15)
BUN: 29 mg/dL — ABNORMAL HIGH (ref 8–23)
CO2: 24 mmol/L (ref 22–32)
Calcium: 8.7 mg/dL — ABNORMAL LOW (ref 8.9–10.3)
Chloride: 97 mmol/L — ABNORMAL LOW (ref 98–111)
Creatinine, Ser: 1.89 mg/dL — ABNORMAL HIGH (ref 0.61–1.24)
GFR, Estimated: 38 mL/min — ABNORMAL LOW (ref >60)
Glucose, Bld: 140 mg/dL — ABNORMAL HIGH (ref 70–99)
Potassium: 3.2 mmol/L — ABNORMAL LOW (ref 3.5–5.1)
Sodium: 135 mmol/L (ref 135–145)

## 2021-11-04 LAB — GLUCOSE, CAPILLARY
Glucose-Capillary: 148 mg/dL — ABNORMAL HIGH (ref 70–99)
Glucose-Capillary: 141 mg/dL — ABNORMAL HIGH (ref 70–99)
Glucose-Capillary: 255 mg/dL — ABNORMAL HIGH (ref 70–99)
Glucose-Capillary: 106 mg/dL — ABNORMAL HIGH (ref 70–99)

## 2021-11-04 LAB — PROTIME-INR
INR: 6.3 (ref 0.8–1.2)
Prothrombin Time: 55.4 seconds — ABNORMAL HIGH (ref 11.4–15.2)

## 2021-11-04 LAB — MAGNESIUM: Magnesium: 2.2 mg/dL (ref 1.7–2.4)

## 2021-11-04 MED ORDER — POTASSIUM CHLORIDE CRYS ER 20 MEQ PO TBCR
40.0000 meq | EXTENDED_RELEASE_TABLET | Freq: Once | ORAL | Status: AC
  Administered 2021-11-04: 40 meq via ORAL
  Filled 2021-11-04: qty 2

## 2021-11-04 MED ORDER — DILTIAZEM HCL-DEXTROSE 125-5 MG/125ML-% IV SOLN (PREMIX)
5.0000 mg/h | INTRAVENOUS | Status: DC
  Administered 2021-11-04: 5 mg/h via INTRAVENOUS
  Administered 2021-11-05: 10 mg/h via INTRAVENOUS
  Administered 2021-11-05: 12.5 mg/h via INTRAVENOUS
  Administered 2021-11-06 – 2021-11-07 (×4): 10 mg/h via INTRAVENOUS
  Filled 2021-11-04 (×10): qty 125

## 2021-11-04 NOTE — Progress Notes (Signed)
ANTICOAGULATION CONSULT NOTE - Follow up [place] for warfarin Indication: atrial fibrillation  Allergies  Allergen Reactions   Codeine Other (See Comments)    Unknown reaction, severe headach   Metformin And Related Diarrhea    Patient Measurements: Height: 5\' 9"  (175.3 cm) Weight: 74.2 kg (163 lb 9.3 oz) IBW/kg (Calculated) : 70.7  Vital Signs: Temp: 98.1 F (36.7 C) (09/16 1103) Temp Source: Oral (09/16 1103) BP: 118/60 (09/16 1103) Pulse Rate: 112 (09/16 1303)  Labs: Recent Labs    11/03/21 0821 11/03/21 0849 11/03/21 1300 11/04/21 0439  HGB 11.3* 12.6*  --  9.8*  HCT 35.8* 37.0*  --  29.0*  PLT 147*  --   --  106*  LABPROT 38.9*  --   --  55.4*  INR 4.0*  --   --  6.3*  CREATININE 2.26* 2.00*  --  1.89*  CKTOTAL  --   --  104  --   TROPONINIHS  --   --  58*  --      Estimated Creatinine Clearance: 37.4 mL/min (A) (by C-G formula based on SCr of 1.89 mg/dL (H)).   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 Zigmund Daniel, laser therapy OD   Seizures ([place])    pt reports this is from low blood sugar   Assessment: 58 yom presented to the ED with weakness.  Afib RVR improved on diltiazem drip EF 50% He is on chronic warfarin for hx afib. INR is supratherapeutic today up to 6. Hgb and platelets are slightly low but no bleeding noted.   Goal of Therapy:  INR 2-3 Monitor platelets by anticoagulation protocol: Yes   Plan:  No warfarin today Daily INR    Bonnita Nasuti Pharm.D. CPP, BCPS Clinical Pharmacist [phone] 11/04/2021 3:51 PM

## 2021-11-04 NOTE — Progress Notes (Signed)
PHARMACY - PHYSICIAN COMMUNICATION CRITICAL VALUE ALERT - BLOOD CULTURE IDENTIFICATION (BCID)  Brian [name] is an 66 y.o. male who presented to [facility] on 11/03/2021 with a chief complaint of complaints of diarrhea and weakness  Assessment:  Escherichia coli  Name of physician (or Provider) Contacted: Hal Hope  Current antibiotics: ceftriaxone   Changes to prescribed antibiotics recommended:  Patient is on recommended antibiotics - No changes needed  Results for orders placed or performed during the hospital encounter of 11/03/21  Blood Culture ID Panel (Reflexed) (Collected: 11/03/2021 10:42 AM)  Result Value Ref Range   Enterococcus faecalis NOT DETECTED NOT DETECTED   Enterococcus Faecium NOT DETECTED NOT DETECTED   Listeria monocytogenes NOT DETECTED NOT DETECTED   Staphylococcus species NOT DETECTED NOT DETECTED   Staphylococcus aureus (BCID) NOT DETECTED NOT DETECTED   Staphylococcus epidermidis NOT DETECTED NOT DETECTED   Staphylococcus lugdunensis NOT DETECTED NOT DETECTED   Streptococcus species NOT DETECTED NOT DETECTED   Streptococcus agalactiae NOT DETECTED NOT DETECTED   Streptococcus pneumoniae NOT DETECTED NOT DETECTED   Streptococcus pyogenes NOT DETECTED NOT DETECTED   A.calcoaceticus-baumannii NOT DETECTED NOT DETECTED   Bacteroides fragilis NOT DETECTED NOT DETECTED   Enterobacterales DETECTED (A) NOT DETECTED   Enterobacter cloacae complex NOT DETECTED NOT DETECTED   Escherichia coli DETECTED (A) NOT DETECTED   Klebsiella aerogenes NOT DETECTED NOT DETECTED   Klebsiella oxytoca NOT DETECTED NOT DETECTED   Klebsiella pneumoniae NOT DETECTED NOT DETECTED   Proteus species NOT DETECTED NOT DETECTED   Salmonella species NOT DETECTED NOT DETECTED   Serratia marcescens NOT DETECTED NOT DETECTED   Haemophilus influenzae NOT DETECTED NOT DETECTED   Neisseria meningitidis NOT DETECTED NOT DETECTED   Pseudomonas aeruginosa NOT DETECTED NOT DETECTED    Stenotrophomonas maltophilia NOT DETECTED NOT DETECTED   Candida albicans NOT DETECTED NOT DETECTED   Candida auris NOT DETECTED NOT DETECTED   Candida glabrata NOT DETECTED NOT DETECTED   Candida krusei NOT DETECTED NOT DETECTED   Candida parapsilosis NOT DETECTED NOT DETECTED   Candida tropicalis NOT DETECTED NOT DETECTED   Cryptococcus neoformans/gattii NOT DETECTED NOT DETECTED   CTX-M ESBL NOT DETECTED NOT DETECTED   Carbapenem resistance IMP NOT DETECTED NOT DETECTED   Carbapenem resistance KPC NOT DETECTED NOT DETECTED   Carbapenem resistance NDM NOT DETECTED NOT DETECTED   Carbapenem resist OXA 48 LIKE NOT DETECTED NOT DETECTED   Carbapenem resistance VIM NOT DETECTED NOT DETECTED    Brian [name] 11/04/2021  4:14 AM

## 2021-11-04 NOTE — Plan of Care (Signed)
  Problem: Education: Goal: Ability to describe self-care measures that may prevent or decrease complications (Diabetes Survival Skills Education) will improve Outcome: Progressing Goal: Individualized Educational Video(s) Outcome: Progressing   

## 2021-11-04 NOTE — Evaluation (Signed)
Physical Therapy Evaluation Patient Details Name: Brian [name] MRN: [id number] DOB: May 27, 1955 Today's Date: 11/04/2021  History of Present Illness  66 yo male admitted 9/15 with weakness, diarrhea and falls in Afib. PMhx: CVA, HTN, HLD, CKD, DM, tongue CA  Clinical Impression  Pt pleasant and reports living at home with mom and doing minimal things around the house to assist her (pt reports she is currently in the hospital as well). Pt with limited hemiplegia from prior CVA with history of 3 recent falls and unable to get off the floor without assist. Pt with decreased strength, endurance and functional mobility who will benefit from acute therapy to maximize mobility, safety and function.   HR 102-130 98% on RA     Recommendations for follow up therapy are one component of a multi-disciplinary discharge planning process, led by the attending physician.  Recommendations may be updated based on patient status, additional functional criteria and insurance authorization.  Follow Up Recommendations Home health PT      Assistance Recommended at Discharge PRN  Patient can return home with the following  Assistance with cooking/housework;A little help with bathing/dressing/bathroom;A little help with walking and/or transfers    Equipment Recommendations Cane  Recommendations for Other Services       Functional Status Assessment Patient has had a recent decline in their functional status and demonstrates the ability to make significant improvements in function in a reasonable and predictable amount of time.     Precautions / Restrictions Precautions Precautions: Fall      Mobility  Bed Mobility Overal bed mobility: Modified Independent             General bed mobility comments: HOB 20 degrees    Transfers Overall transfer level: Needs assistance   Transfers: Sit to/from Stand Sit to Stand: Min guard           General transfer comment: guarding for safety to  stand from bed    Ambulation/Gait Ambulation/Gait assistance: Min assist Gait Distance (Feet): 180 Feet Assistive device: None Gait Pattern/deviations: Step-through pattern, Decreased stride length, Decreased stance time - left   Gait velocity interpretation: <1.8 ft/sec, indicate of risk for recurrent falls   General Gait Details: pt with slightly hemiparetic gait with decreased arm swing left and decreased stance left. 2 partial LOB with tactile cues to recover. Limited by fatigue with max HR 130 during gait  Stairs Stairs:  (pt denied attempting this session)          Wheelchair Mobility    Modified Rankin (Stroke Patients Only)       Balance Overall balance assessment: Needs assistance, History of Falls   Sitting balance-Leahy Scale: Fair Sitting balance - Comments: pt unable to don/doff socks EOB     Standing balance-Leahy Scale: Good Standing balance comment: static standing with guarding and gait                             Pertinent Vitals/Pain Pain Assessment Pain Assessment: No/denies pain    Home Living Family/patient expects to be discharged to:: Private residence Living Arrangements: Parent Available Help at Discharge: Family;Available PRN/intermittently Type of Home: House Home Access: Stairs to enter Entrance Stairs-Rails: Left Entrance Stairs-Number of Steps: 3   Home Layout: One level Home Equipment: None      Prior Function Prior Level of Function : Independent/Modified Independent             Mobility Comments: pt reports  he drives and cares for himself without AD       Hand Dominance        Extremity/Trunk Assessment   Upper Extremity Assessment Upper Extremity Assessment: LUE deficits/detail LUE Deficits / Details: baseline weakness and decreased ROM from prior CVA    Lower Extremity Assessment Lower Extremity Assessment: LLE deficits/detail LLE Deficits / Details: decreased strength, baseline due to CVA     Cervical / Trunk Assessment Cervical / Trunk Assessment: Normal  Communication   Communication: HOH  Cognition Arousal/Alertness: Awake/alert Behavior During Therapy: WFL for tasks assessed/performed Overall Cognitive Status: Impaired/Different from baseline Area of Impairment: Safety/judgement                         Safety/Judgement: Decreased awareness of deficits              General Comments      Exercises     Assessment/Plan    PT Assessment Patient needs continued PT services  PT Problem List Decreased mobility;Decreased activity tolerance;Decreased balance;Decreased knowledge of use of DME       PT Treatment Interventions Gait training;Balance training;Stair training;Functional mobility training;Therapeutic activities;Patient/family education;Neuromuscular re-education;DME instruction;Therapeutic exercise    PT Goals (Current goals can be found in the Care Plan section)  Acute Rehab PT Goals Patient Stated Goal: return home PT Goal Formulation: With patient Time For Goal Achievement: 11/18/21 Potential to Achieve Goals: Fair    Frequency Min 3X/week     Co-evaluation               AM-PAC PT "6 Clicks" Mobility  Outcome Measure Help needed turning from your back to your side while in a flat bed without using bedrails?: A Little Help needed moving from lying on your back to sitting on the side of a flat bed without using bedrails?: A Little Help needed moving to and from a bed to a chair (including a wheelchair)?: A Little Help needed standing up from a chair using your arms (e.g., wheelchair or bedside chair)?: A Little Help needed to walk in hospital room?: A Little Help needed climbing 3-5 steps with a railing? : A Lot 6 Click Score: 17    End of Session   Activity Tolerance: Patient tolerated treatment well Patient left: in chair;with call bell/phone within reach Nurse Communication: Mobility status PT Visit Diagnosis: Other  abnormalities of gait and mobility (R26.89);Difficulty in walking, not elsewhere classified (R26.2);History of falling (Z91.81)    Time: 5361-4431 PT Time Calculation (min) (ACUTE ONLY): 20 min   Charges:   PT Evaluation $PT Eval Moderate Complexity: 1 Mod          Viera Okonski P, PT Acute Rehabilitation Services Office: [phone]   Sandy Salaam Sravya Grissom 11/04/2021, 1:08 PM

## 2021-11-04 NOTE — Progress Notes (Signed)
OT Cancellation Note  Patient Details Name: TYNELL WINCHELL MRN: [id number] DOB: 10/25/1955   Cancelled Treatment:    Reason Eval/Treat Not Completed: Other (comment).  Patient wanting to sleep a little longer, OT advised I would stop back later.    Zoei Amison D Loys Shugars 11/04/2021, 9:12 AM 11/04/2021  RP, OTR/L  Acute Rehabilitation Services  Office:  [phone]

## 2021-11-04 NOTE — Progress Notes (Signed)
PT Cancellation Note  Patient Details Name: Brian [name] MRN: [id number] DOB: 22-Jan-1956   Cancelled Treatment:    Reason Eval/Treat Not Completed: Patient not medically ready (pt with HR 110-140 at rest, RN starting cardizem)   [name] 11/04/2021, 9:48 AM [place] Office: [phone]

## 2021-11-04 NOTE — Evaluation (Signed)
Occupational Therapy Evaluation Patient Details Name: Brian [name] MRN: [id number] DOB: 09-28-1955 Today's Date: 11/04/2021   History of Present Illness 66 yo male admitted 9/15 with weakness, diarrhea and falls in Afib. PMhx: CVA, HTN, HLD, CKD, DM, tongue CA.   Clinical Impression   Patient admitted for the diagnosis above.  PTA he live at home with his mother.  Patient admits to some mild unsteadiness, but no significant OT needs exist in the acute care setting.  Encouraged patient to walk with staff in the halls.  Dicussed possible left cone splint, advised to talk with his PCP to arrange.        Recommendations for follow up therapy are one component of a multi-disciplinary discharge planning process, led by the attending physician.  Recommendations may be updated based on patient status, additional functional criteria and insurance authorization.   Follow Up Recommendations  No OT follow up    Assistance Recommended at Discharge None  Patient can return home with the following      Functional Status Assessment  Patient has not had a recent decline in their functional status  Equipment Recommendations  None recommended by OT    Recommendations for Other Services       Precautions / Restrictions Precautions Precautions: None Restrictions Weight Bearing Restrictions: No      Mobility Bed Mobility Overal bed mobility: Modified Independent                  Transfers Overall transfer level: Needs assistance   Transfers: Sit to/from Stand, Bed to chair/wheelchair/BSC Sit to Stand: Supervision     Step pivot transfers: Supervision            Balance Overall balance assessment: Mild deficits observed, not formally tested                                         ADL either performed or assessed with clinical judgement   ADL Overall ADL's : At baseline                                             Vision Patient  Visual Report: No change from baseline       Perception Perception Perception: Not tested   Praxis Praxis Praxis: Not tested    Pertinent Vitals/Pain Pain Assessment Pain Assessment: No/denies pain     Hand Dominance Right   Extremity/Trunk Assessment Upper Extremity Assessment Upper Extremity Assessment: LUE deficits/detail LUE Deficits / Details: baseline weakness and decreased ROM from prior CVA, 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 LUE Sensation: WNL LUE Coordination: decreased fine motor   Lower Extremity Assessment Lower Extremity Assessment: Defer to PT evaluation LLE Deficits / Details: decreased strength, baseline due to CVA   Cervical / Trunk Assessment Cervical / Trunk Assessment: Normal   Communication Communication Communication: HOH   Cognition Arousal/Alertness: Awake/alert Behavior During Therapy: WFL for tasks assessed/performed Overall Cognitive Status: Within Functional Limits for tasks assessed                                       General Comments   VSS    Exercises     Shoulder Instructions  Home Living Family/patient expects to be discharged to:: Private residence Living Arrangements: Parent Available Help at Discharge: Family;Available PRN/intermittently Type of Home: House Home Access: Stairs to enter [employer] of Steps: 3 Entrance Stairs-Rails: Left Home Layout: One level     Bathroom Shower/Tub: Occupational psychologist: Standard Bathroom Accessibility: Yes How Accessible: Accessible via walker Home Equipment: None          Prior Functioning/Environment Prior Level of Function : Independent/Modified Independent             Mobility Comments: pt reports he drives and cares for himself without AD ADLs Comments: No assist at baseline        OT Problem List: Impaired balance (sitting and/or standing);Decreased safety awareness      OT Treatment/Interventions:      OT  Goals(Current goals can be found in the care plan section) Acute Rehab OT Goals Patient Stated Goal: Return home OT Goal Formulation: With patient Time For Goal Achievement: 11/06/21 Potential to Achieve Goals: Good  OT Frequency:      Co-evaluation              AM-PAC OT "6 Clicks" Daily Activity     Outcome Measure Help from another person eating meals?: None Help from another person taking care of personal grooming?: None Help from another person toileting, which includes using toliet, bedpan, or urinal?: A Little Help from another person bathing (including washing, rinsing, drying)?: A Little Help from another person to put on and taking off regular upper body clothing?: None Help from another person to put on and taking off regular lower body clothing?: A Little 6 Click Score: 21   End of Session    Activity Tolerance: Patient tolerated treatment well Patient left: in bed;with call bell/phone within reach  OT Visit Diagnosis: Unsteadiness on feet (R26.81)                Time: 1340-1400 OT Time Calculation (min): 20 min Charges:  OT General Charges $OT Visit: 1 Visit OT Evaluation $OT Eval Moderate Complexity: 1 Mod  11/04/2021  RP, OTR/L  Acute Rehabilitation Services  Office:  [phone]   Metta Clines 11/04/2021, 2:11 PM

## 2021-11-04 NOTE — Progress Notes (Signed)
PROGRESS NOTE  Brian [name] LYY:[id number] DOB: 1955/06/06 DOA: 11/03/2021 PCP: [name], MD  HPI/Recap of past 24 hours: Brian [name] is a 66 y.o. 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  He feels globally weak and has had at least 2 falls at home. Upon admission into the emergency department patient was noted to be afebrile in atrial fibrillation with heart rates elevated into the 160s. Chest x-ray noted patchy airspace opacity of the left lower lung.  Patient admitted for further management.    Today, patient denies any new complaints.  Noted to still be in A-fib with RVR.  Cardizem drip started.    Assessment/Plan: Principal Problem:   Atrial fibrillation (HCC) Active Problems:   Hypokalemia   Aspiration pneumonia ([place])   Dysphagia   Fall at home, initial encounter   Acute kidney injury superimposed on chronic kidney disease (HCC)   Nausea, vomiting, and diarrhea   Essential hypertension, benign   Diastolic dysfunction   Diabetes mellitus, type II ([place])   History of CVA with residual deficit   Malignant neoplasm of base of tongue (HCC)   Pancytopenia (HCC)   Atrial fibrillation with RVR supratherapeutic INR Rate uncontrolled Started on Cardizem drip, held p.o. Cardizem and Coreg due to soft BP Coumadin per pharmacy, currently held-supratherapeutic Telemetry  ?Possible E. coli bacteremia 1 set of blood culture growing E. Coli Unsure of etiology Plan to repeat BC x2 Continue ceftriaxone for now   Hypokalemia Replace as needed   Suspected aspiration pneumonia dysphagia Possible CAP Currently afebrile, with no leukocytosis Intermittent cough and complaints of getting  choked up with eating Procalcitonin 0.86, will trend CXR with left lower lobe opacity on chest x-ray Modified barium swallow with recommendations of a dysphagia 3 diet with thin liquids and meds with pure on 9/7 Aspiration precautions Continue Rocephin and azithromycin   Falls at home, initial encounter CT scan of the head did not note any acute abnormalities. PT/OT to evaluate and treat   Acute kidney injury superimposed on chronic kidney disease stage IIIb On admission creatinine elevated up to 2.26 with BUN 26 Baseline creatinine previously noted to be 1.73 back in July Avoid nephrotoxic agents at this time Daily BMP   Essential hypertension BP soft Hold home Coreg 25 mg twice daily, Cardizem long-acting 240 mg daily, hydralazine 50 mg 3 times daily, and losartan 100 mg daily for now Continue Cardizem drip   Chronic diastolic dysfunction Last EF noted to be 55-60% with grade 2 diastolic dysfunction by echocardiogram from 03/07/2021 Strict I&Os and daily weights   Diabetes mellitus type 2 Last hemoglobin A1c 6.6 SSI, Accu-Cheks, hypoglycemic protocol Hold home medication Actos 30 mg daily and Jardiance 25 mg daily  History of CVA with residual deficit Patient with prior history of stroke with residual left upper extremity weakness.   History of squamous cell carcinoma of base of the tongue Prior treatment with 7 weeks of radiation and chemotherapy.   Pancytopenia History of malignancy with past chemotherapy Monitor closely    Estimated body mass index is 24.16 kg/m as calculated from the following:   Height as of this encounter: 5\' 9"  (1.753 m).   Weight as of this encounter: 74.2 kg.     Code  Status: Full  Family Communication: None at bedside  Disposition Plan: Status is: Inpatient Remains inpatient appropriate because: Level of care      Consultants: None  Procedures: None  Antimicrobials: Ceftriaxone Azithromycin  DVT prophylaxis: Warfarin,  held-supratherapeutic   Objective: Vitals:   11/04/21 0638 11/04/21 0720 11/04/21 1103 11/04/21 1303  BP:  (!) 117/93 118/60   Pulse:  98 (!) 118 (!) 112  Resp:  (!) 25 20   Temp:  98.3 F (36.8 C) 98.1 F (36.7 C)   TempSrc:  Oral Oral   SpO2:  94% 97%   Weight: 74.2 kg     Height:        Intake/Output Summary (Last 24 hours) at 11/04/2021 1438 Last data filed at 11/03/2021 1445 Gross per 24 hour  Intake 100 ml  Output --  Net 100 ml   Filed Weights   11/03/21 0809 11/04/21 0638  Weight: 85.7 kg 74.2 kg    Exam: General: NAD, chronically ill-appearing, appears older than stated age Cardiovascular: S1, S2 present Respiratory: CTAB Abdomen: Soft, nontender, nondistended, bowel sounds present Musculoskeletal: No bilateral pedal edema noted Skin: Normal Psychiatry: Normal mood     Data Reviewed: CBC: Recent Labs  Lab 11/03/21 0821 11/03/21 0849 11/04/21 0439  WBC 7.9  --  5.6  NEUTROABS 6.8  --   --   HGB 11.3* 12.6* 9.8*  HCT 35.8* 37.0* 29.0*  MCV 83.8  --  81.2  PLT 147*  --  161*   Basic Metabolic Panel: Recent Labs  Lab 11/03/21 0821 11/03/21 0849 11/04/21 0439  NA 136 135 135  K 2.6* 2.6* 3.2*  CL 95* 97* 97*  CO2 23  --  24  GLUCOSE 150* 148* 140*  BUN 26* 23 29*  CREATININE 2.26* 2.00* 1.89*  CALCIUM 9.1  --  8.7*  MG 1.7  --   --    GFR: Estimated Creatinine Clearance: 37.4 mL/min (A) (by C-G formula based on SCr of 1.89 mg/dL (H)). Liver Function Tests: Recent Labs  Lab 11/03/21 0821  AST 16  ALT 12  ALKPHOS 65  BILITOT 0.8  PROT 6.5  ALBUMIN 3.3*   No results for input(s): "LIPASE", "AMYLASE" in the last 168 hours. No results for input(s): "AMMONIA" in the last 168 hours. Coagulation Profile: Recent Labs  Lab 11/03/21 0821 11/04/21 0439  INR 4.0* 6.3*   Cardiac Enzymes: Recent Labs  Lab 11/03/21 1300  CKTOTAL 104   BNP (last 3 results) No results for input(s): "PROBNP" in the last 8760 hours. HbA1C: Recent  Labs    11/03/21 0821  HGBA1C 6.6*   CBG: Recent Labs  Lab 11/03/21 1235 11/03/21 1845 11/03/21 2122 11/04/21 0638 11/04/21 1104  GLUCAP 137* 177* 232* 148* 141*   Lipid Profile: No results for input(s): "CHOL", "HDL", "LDLCALC", "TRIG", "CHOLHDL", "LDLDIRECT" in the last 72 hours. Thyroid Function Tests: No results for input(s): "TSH", "T4TOTAL", "FREET4", "T3FREE", "THYROIDAB" in the last 72 hours. Anemia Panel: No results for input(s): "VITAMINB12", "FOLATE", "FERRITIN", "TIBC", "IRON", "RETICCTPCT" in the last 72 hours. Urine analysis:    Component Value Date/Time   COLORURINE YELLOW 01/17/2013 0623   APPEARANCEUR CLEAR 01/17/2013 0623   LABSPEC 1.018 01/17/2013 0623   PHURINE 5.0 01/17/2013 0623   GLUCOSEU 250 (A) 01/17/2013 0623   HGBUR NEGATIVE 01/17/2013 0623   BILIRUBINUR NEGATIVE 01/17/2013 0623   KETONESUR NEGATIVE 01/17/2013 0623   PROTEINUR NEGATIVE 01/17/2013 0623   UROBILINOGEN 0.2 01/17/2013 0623   NITRITE NEGATIVE 01/17/2013 0960  LEUKOCYTESUR NEGATIVE 01/17/2013 1941   Sepsis Labs: @LABRCNTIP (procalcitonin:4,lacticidven:4)  ) Recent Results (from the past 240 hour(s))  Resp Panel by RT-PCR (Flu A&B, Covid) Anterior Nasal Swab     Status: None   Collection Time: 11/03/21  8:29 AM   Specimen: Anterior Nasal Swab  Result Value Ref Range Status   SARS Coronavirus 2 by RT PCR NEGATIVE NEGATIVE Final    Comment: (NOTE) SARS-CoV-2 target nucleic acids are NOT DETECTED.  The SARS-CoV-2 RNA is generally detectable in upper respiratory specimens during the acute phase of infection. The lowest concentration of SARS-CoV-2 viral copies this assay can detect is 138 copies/mL. A negative result does not preclude SARS-Cov-2 infection and should not be used as the sole basis for treatment or other patient management decisions. A negative result may occur with  improper specimen collection/handling, submission of specimen other than nasopharyngeal swab,  presence of viral mutation(s) within the areas targeted by this assay, and inadequate number of viral copies(<138 copies/mL). A negative result must be combined with clinical observations, patient history, and epidemiological information. The expected result is Negative.  Fact Sheet for Patients:  EntrepreneurPulse.com.au  Fact Sheet for Healthcare Providers:  IncredibleEmployment.be  This test is no t yet approved or cleared by the [place] FDA and  has been authorized for detection and/or diagnosis of SARS-CoV-2 by FDA under an Emergency Use Authorization (EUA). This EUA will remain  in effect (meaning this test can be used) for the duration of the COVID-19 declaration under Section 564(b)(1) of the Act, 21 U.S.C.section 360bbb-3(b)(1), unless the authorization is terminated  or revoked sooner.       Influenza A by PCR NEGATIVE NEGATIVE Final   Influenza B by PCR NEGATIVE NEGATIVE Final    Comment: (NOTE) The Xpert Xpress SARS-CoV-2/FLU/RSV plus assay is intended as an aid in the diagnosis of influenza from Nasopharyngeal swab specimens and should not be used as a sole basis for treatment. Nasal washings and aspirates are unacceptable for Xpert Xpress SARS-CoV-2/FLU/RSV testing.  Fact Sheet for Patients: EntrepreneurPulse.com.au  Fact Sheet for Healthcare Providers: IncredibleEmployment.be  This test is not yet approved or cleared by the [place] FDA and has been authorized for detection and/or diagnosis of SARS-CoV-2 by FDA under an Emergency Use Authorization (EUA). This EUA will remain in effect (meaning this test can be used) for the duration of the COVID-19 declaration under Section 564(b)(1) of the Act, 21 U.S.C. section 360bbb-3(b)(1), unless the authorization is terminated or revoked.  Performed at [facility], [place] [address] [place], [place] [address]   Culture, blood  (single) w Reflex to ID Panel     Status: None (Preliminary result)   Collection Time: 11/03/21 10:42 AM   Specimen: BLOOD RIGHT HAND  Result Value Ref Range Status   Specimen Description BLOOD RIGHT HAND  Final   Special Requests   Final    BOTTLES DRAWN AEROBIC AND ANAEROBIC Blood Culture adequate volume   Culture  Setup Time   Final    GRAM NEGATIVE RODS ANAEROBIC BOTTLE ONLY Organism ID to follow CRITICAL RESULT CALLED TO, READ BACK BY AND VERIFIED WITH: T RUDISILL,[email or web address]@[id number]  11/04/21 [place]    Culture   Final    NO GROWTH < 24 HOURS Performed at [facility], [place] [address] [place], [place] [address]    Report Status PENDING  Incomplete  Blood Culture ID Panel (Reflexed)     Status: Abnormal   Collection Time: 11/03/21 10:42 AM  Result Value Ref  Range Status   Enterococcus faecalis NOT DETECTED NOT DETECTED Final   Enterococcus Faecium NOT DETECTED NOT DETECTED Final   Listeria monocytogenes NOT DETECTED NOT DETECTED Final   Staphylococcus species NOT DETECTED NOT DETECTED Final   Staphylococcus aureus (BCID) NOT DETECTED NOT DETECTED Final   Staphylococcus epidermidis NOT DETECTED NOT DETECTED Final   Staphylococcus lugdunensis NOT DETECTED NOT DETECTED Final   Streptococcus species NOT DETECTED NOT DETECTED Final   Streptococcus agalactiae NOT DETECTED NOT DETECTED Final   Streptococcus pneumoniae NOT DETECTED NOT DETECTED Final   Streptococcus pyogenes NOT DETECTED NOT DETECTED Final   A.calcoaceticus-baumannii NOT DETECTED NOT DETECTED Final   Bacteroides fragilis NOT DETECTED NOT DETECTED Final   Enterobacterales DETECTED (A) NOT DETECTED Final    Comment: Enterobacterales represent a large order of gram negative bacteria, not a single organism. CRITICAL RESULT CALLED TO, READ BACK BY AND VERIFIED WITH: T RUDISILL,PHARMD@[id number]  11/04/21 [place]    Enterobacter cloacae complex NOT DETECTED NOT DETECTED Final   Escherichia coli DETECTED (A) NOT DETECTED Final    Comment:  CRITICAL RESULT CALLED TO, READ BACK BY AND VERIFIED WITH: T RUDISILL,PHARMD@[email or web address]  11/04/21 [place]    Klebsiella aerogenes NOT DETECTED NOT DETECTED Final   Klebsiella oxytoca NOT DETECTED NOT DETECTED Final   Klebsiella pneumoniae NOT DETECTED NOT DETECTED Final   Proteus species NOT DETECTED NOT DETECTED Final   Salmonella species NOT DETECTED NOT DETECTED Final   Serratia marcescens NOT DETECTED NOT DETECTED Final   Haemophilus influenzae NOT DETECTED NOT DETECTED Final   Neisseria meningitidis NOT DETECTED NOT DETECTED Final   Pseudomonas aeruginosa NOT DETECTED NOT DETECTED Final   Stenotrophomonas maltophilia NOT DETECTED NOT DETECTED Final   Candida albicans NOT DETECTED NOT DETECTED Final   Candida auris NOT DETECTED NOT DETECTED Final   Candida glabrata NOT DETECTED NOT DETECTED Final   Candida krusei NOT DETECTED NOT DETECTED Final   Candida parapsilosis NOT DETECTED NOT DETECTED Final   Candida tropicalis NOT DETECTED NOT DETECTED Final   Cryptococcus neoformans/gattii NOT DETECTED NOT DETECTED Final   CTX-M ESBL NOT DETECTED NOT DETECTED Final   Carbapenem resistance IMP NOT DETECTED NOT DETECTED Final   Carbapenem resistance KPC NOT DETECTED NOT DETECTED Final   Carbapenem resistance NDM NOT DETECTED NOT DETECTED Final   Carbapenem resist OXA 48 LIKE NOT DETECTED NOT DETECTED Final   Carbapenem resistance VIM NOT DETECTED NOT DETECTED Final    Comment: Performed at [facility], [address], [place], [place] [address]  MRSA Next Gen by PCR, Nasal     Status: None   Collection Time: 11/03/21  3:28 PM   Specimen: Nasal Mucosa; Nasal Swab  Result Value Ref Range Status   MRSA by PCR Next Gen NOT DETECTED NOT DETECTED Final    Comment: (NOTE) The GeneXpert MRSA Assay (FDA approved for NASAL specimens only), is one component of a comprehensive MRSA colonization surveillance program. It is not intended to diagnose MRSA infection nor to guide or monitor treatment for  MRSA infections. Test performance is not FDA approved in patients less than 20 years old. Performed at [facility], [place] [address] [place], [place] [address]       Studies: No results found.  Scheduled Meds:  atorvastatin  20 mg Oral Daily   guaiFENesin  600 mg Oral BID   insulin aspart  0-5 Units Subcutaneous QHS   insulin aspart  0-9 Units Subcutaneous TID WC   sodium chloride flush  3 mL Intravenous  Q12H   Warfarin - Pharmacist Dosing Inpatient   Does not apply q1600    Continuous Infusions:  azithromycin 500 mg (11/04/21 1034)   cefTRIAXone (ROCEPHIN)  IV 2 g (11/04/21 0939)   diltiazem (CARDIZEM) infusion 5 mg/hr (11/04/21 1109)     LOS: 1 day     Alma Friendly, MD [facility]  If 7PM-7AM, please contact night-coverage [email or web address] 11/04/2021, 2:39 PM

## 2021-11-05 DIAGNOSIS — I5189 Other ill-defined heart diseases: Secondary | ICD-10-CM | POA: Diagnosis not present

## 2021-11-05 DIAGNOSIS — N179 Acute kidney failure, unspecified: Secondary | ICD-10-CM | POA: Diagnosis not present

## 2021-11-05 DIAGNOSIS — J690 Pneumonitis due to inhalation of food and vomit: Secondary | ICD-10-CM | POA: Diagnosis not present

## 2021-11-05 DIAGNOSIS — I480 Paroxysmal atrial fibrillation: Secondary | ICD-10-CM | POA: Diagnosis not present

## 2021-11-05 LAB — CBC WITH DIFFERENTIAL/PLATELET
Abs Immature Granulocytes: 0.01 10*3/uL (ref 0.00–0.07)
Basophils Absolute: 0 10*3/uL (ref 0.0–0.1)
Basophils Relative: 0 %
Eosinophils Absolute: 0 10*3/uL (ref 0.0–0.5)
Eosinophils Relative: 0 %
HCT: 31 % — ABNORMAL LOW (ref 39.0–52.0)
Hemoglobin: 10.1 g/dL — ABNORMAL LOW (ref 13.0–17.0)
Immature Granulocytes: 0 %
Lymphocytes Relative: 5 %
Lymphs Abs: 0.2 10*3/uL — ABNORMAL LOW (ref 0.7–4.0)
MCH: 26.6 pg (ref 26.0–34.0)
MCHC: 32.6 g/dL (ref 30.0–36.0)
MCV: 81.8 fL (ref 80.0–100.0)
Monocytes Absolute: 0.6 10*3/uL (ref 0.1–1.0)
Monocytes Relative: 12 %
Neutro Abs: 3.8 10*3/uL (ref 1.7–7.7)
Neutrophils Relative %: 83 %
Platelets: 131 10*3/uL — ABNORMAL LOW (ref 150–400)
RBC: 3.79 MIL/uL — ABNORMAL LOW (ref 4.22–5.81)
RDW: 15.1 % (ref 11.5–15.5)
WBC: 4.7 10*3/uL (ref 4.0–10.5)
nRBC: 0 % (ref 0.0–0.2)

## 2021-11-05 LAB — BASIC METABOLIC PANEL
Anion gap: 8 (ref 5–15)
BUN: 22 mg/dL (ref 8–23)
CO2: 25 mmol/L (ref 22–32)
Calcium: 8.1 mg/dL — ABNORMAL LOW (ref 8.9–10.3)
Chloride: 99 mmol/L (ref 98–111)
Creatinine, Ser: 1.66 mg/dL — ABNORMAL HIGH (ref 0.61–1.24)
GFR, Estimated: 45 mL/min — ABNORMAL LOW (ref >60)
Glucose, Bld: 153 mg/dL — ABNORMAL HIGH (ref 70–99)
Potassium: 3.2 mmol/L — ABNORMAL LOW (ref 3.5–5.1)
Sodium: 132 mmol/L — ABNORMAL LOW (ref 135–145)

## 2021-11-05 LAB — PROTIME-INR
INR: 4.5 (ref 0.8–1.2)
Prothrombin Time: 42.6 seconds — ABNORMAL HIGH (ref 11.4–15.2)

## 2021-11-05 LAB — GLUCOSE, CAPILLARY
Glucose-Capillary: 220 mg/dL — ABNORMAL HIGH (ref 70–99)
Glucose-Capillary: 178 mg/dL — ABNORMAL HIGH (ref 70–99)
Glucose-Capillary: 174 mg/dL — ABNORMAL HIGH (ref 70–99)

## 2021-11-05 LAB — PROCALCITONIN: Procalcitonin: 0.33 ng/mL

## 2021-11-05 MED ORDER — CARVEDILOL 25 MG PO TABS
25.0000 mg | ORAL_TABLET | Freq: Two times a day (BID) | ORAL | Status: DC
  Administered 2021-11-05 – 2021-11-09 (×8): 25 mg via ORAL
  Filled 2021-11-05 (×8): qty 1

## 2021-11-05 NOTE — Progress Notes (Signed)
PROGRESS NOTE  Brian [name] GQQ:[id number] DOB: [birth year] DOA: 11/03/2021 PCP: [name], MD  HPI/Recap of past 24 hours: Brian [name] is a 66 y.o. 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  He feels globally weak and has had at least 2 falls at home. Upon admission into the emergency department patient was noted to be afebrile in atrial fibrillation with heart rates elevated into the 160s. Chest x-ray noted patchy airspace opacity of the left lower lung.  Patient admitted for further management.    Today, patient denies any new complaints, still with RVR A-fib.  Reports mother is also admitted here in the hospital.     Assessment/Plan: Principal Problem:   Atrial fibrillation ([place]) Active Problems:   Hypokalemia   Aspiration pneumonia ([place])   Dysphagia   Fall at home, initial encounter   Acute kidney injury superimposed on chronic kidney disease (HCC)   Nausea, vomiting, and diarrhea   Essential hypertension, benign   Diastolic dysfunction   Diabetes mellitus, type II ([place])   History of CVA with residual deficit   Malignant neoplasm of base of tongue (HCC)   Pancytopenia (HCC)   Atrial fibrillation with RVR supratherapeutic INR Rate uncontrolled Started on Cardizem drip, held p.o. Cardizem, restart Coreg Coumadin per pharmacy, currently held-supratherapeutic Telemetry  ?Possible E. coli bacteremia 1 set of blood culture growing E. Coli, repeat pending Unsure of etiology Continue ceftriaxone for now   Hypokalemia Replace as needed   Suspected aspiration pneumonia dysphagia Possible CAP Currently afebrile, with no leukocytosis Intermittent cough and complaints of  getting choked up with eating Procalcitonin 0.86, will trend CXR with left lower lobe opacity on chest x-ray Modified barium swallow with recommendations of a dysphagia 3 diet with thin liquids and meds with pure on 9/7 Aspiration precautions Continue Rocephin and azithromycin   Falls at home, initial encounter CT scan of the head did not note any acute abnormalities. PT/OT to evaluate and treat   Acute kidney injury superimposed on chronic kidney disease stage IIIb On admission creatinine elevated up to 2.26 with BUN 26 Baseline creatinine previously noted to be 1.73 back in July Avoid nephrotoxic agents at this time Daily BMP   Essential hypertension BP stable Restart home Coreg 25 mg twice daily, hold Cardizem long-acting 240 mg daily, hydralazine 50 mg 3 times daily, and losartan 100 mg daily for now Continue Cardizem drip   Chronic diastolic dysfunction Last EF noted to be 55-60% with grade 2 diastolic dysfunction by echocardiogram from 03/07/2021 Strict I&Os and daily weights   Diabetes mellitus type 2 Last hemoglobin A1c 6.6 SSI, Accu-Cheks, hypoglycemic protocol Hold home medication Actos 30 mg daily and Jardiance 25 mg daily  History of CVA with residual deficit Patient with prior history of stroke with residual left upper extremity weakness.   History of squamous cell carcinoma of base of the tongue Prior treatment with 7 weeks of radiation and chemotherapy.   Pancytopenia History of malignancy with past chemotherapy Monitor closely    Estimated body mass index is 24.47 kg/m as calculated from the following:   Height as of this encounter: 5\' 9"  (1.753 m).   Weight as of this encounter: 75.2 kg.     Code Status: Full  Family  Communication: None at bedside  Disposition Plan: Status is: Inpatient Remains inpatient appropriate because: Level of care      Consultants: None  Procedures: None  Antimicrobials: Ceftriaxone Azithromycin  DVT  prophylaxis: Warfarin, held-supratherapeutic   Objective: Vitals:   11/05/21 0400 11/05/21 0708 11/05/21 1109 11/05/21 1515  BP:  (!) 119/91 (!) 126/96 137/87  Pulse:  99 98 93  Resp:  19 18 13   Temp:  97.6 F (36.4 C) 97.6 F (36.4 C) 97.6 F (36.4 C)  TempSrc:  Oral Oral Oral  SpO2:  97% 93% 95%  Weight: 75.2 kg     Height:        Intake/Output Summary (Last 24 hours) at 11/05/2021 1818 Last data filed at 11/05/2021 1525 Gross per 24 hour  Intake 1298.18 ml  Output 1000 ml  Net 298.18 ml   Filed Weights   11/03/21 0809 11/04/21 0638 11/05/21 0400  Weight: 85.7 kg 74.2 kg 75.2 kg    Exam: General: NAD, chronically ill-appearing, appears older than stated age Cardiovascular: S1, S2 present Respiratory: CTAB Abdomen: Soft, nontender, nondistended, bowel sounds present Musculoskeletal: No bilateral pedal edema noted Skin: Normal Psychiatry: Normal mood     Data Reviewed: CBC: Recent Labs  Lab 11/03/21 0821 11/03/21 0849 11/04/21 0439 11/05/21 0813  WBC 7.9  --  5.6 4.7  NEUTROABS 6.8  --   --  3.8  HGB 11.3* 12.6* 9.8* 10.1*  HCT 35.8* 37.0* 29.0* 31.0*  MCV 83.8  --  81.2 81.8  PLT 147*  --  106* 267*   Basic Metabolic Panel: Recent Labs  Lab 11/03/21 0821 11/03/21 0849 11/04/21 0439 11/04/21 0750 11/05/21 0813  NA 136 135 135  --  132*  K 2.6* 2.6* 3.2*  --  3.2*  CL 95* 97* 97*  --  99  CO2 23  --  24  --  25  GLUCOSE 150* 148* 140*  --  153*  BUN 26* 23 29*  --  22  CREATININE 2.26* 2.00* 1.89*  --  1.66*  CALCIUM 9.1  --  8.7*  --  8.1*  MG 1.7  --   --  2.2  --    GFR: Estimated Creatinine Clearance: 42.6 mL/min (A) (by C-G formula based on SCr of 1.66 mg/dL (H)). Liver Function Tests: Recent Labs  Lab 11/03/21 0821  AST 16  ALT 12  ALKPHOS 65  BILITOT 0.8  PROT 6.5  ALBUMIN 3.3*   No results for input(s): "LIPASE", "AMYLASE" in the last 168 hours. No results for input(s): "AMMONIA" in the last 168 hours. Coagulation  Profile: Recent Labs  Lab 11/03/21 0821 11/04/21 0439 11/05/21 0813  INR 4.0* 6.3* 4.5*   Cardiac Enzymes: Recent Labs  Lab 11/03/21 1300  CKTOTAL 104   BNP (last 3 results) No results for input(s): "PROBNP" in the last 8760 hours. HbA1C: Recent Labs    11/03/21 0821  HGBA1C 6.6*   CBG: Recent Labs  Lab 11/04/21 1551 11/04/21 2227 11/05/21 0447 11/05/21 1111 11/05/21 1517  GLUCAP 255* 106* 220* 178* 174*   Lipid Profile: No results for input(s): "CHOL", "HDL", "LDLCALC", "TRIG", "CHOLHDL", "LDLDIRECT" in the last 72 hours. Thyroid Function Tests: No results for input(s): "TSH", "T4TOTAL", "FREET4", "T3FREE", "THYROIDAB" in the last 72 hours. Anemia Panel: No results for input(s): "VITAMINB12", "FOLATE", "FERRITIN", "TIBC", "IRON", "RETICCTPCT" in the last 72 hours. Urine analysis:    Component Value Date/Time   COLORURINE YELLOW 01/17/2013 0623   APPEARANCEUR CLEAR 01/17/2013 1245  LABSPEC 1.018 01/17/2013 0623   PHURINE 5.0 01/17/2013 0623   GLUCOSEU 250 (A) 01/17/2013 0623   HGBUR NEGATIVE 01/17/2013 0623   BILIRUBINUR NEGATIVE 01/17/2013 0623   KETONESUR NEGATIVE 01/17/2013 0623   PROTEINUR NEGATIVE 01/17/2013 0623   UROBILINOGEN 0.2 01/17/2013 0623   NITRITE NEGATIVE 01/17/2013 0623   LEUKOCYTESUR NEGATIVE 01/17/2013 0623   Sepsis Labs: @LABRCNTIP (procalcitonin:4,lacticidven:4)  ) Recent Results (from the past 240 hour(s))  Resp Panel by RT-PCR (Flu A&B, Covid) Anterior Nasal Swab     Status: None   Collection Time: 11/03/21  8:29 AM   Specimen: Anterior Nasal Swab  Result Value Ref Range Status   SARS Coronavirus 2 by RT PCR NEGATIVE NEGATIVE Final    Comment: (NOTE) SARS-CoV-2 target nucleic acids are NOT DETECTED.  The SARS-CoV-2 RNA is generally detectable in upper respiratory specimens during the acute phase of infection. The lowest concentration of SARS-CoV-2 viral copies this assay can detect is 138 copies/mL. A negative result does  not preclude SARS-Cov-2 infection and should not be used as the sole basis for treatment or other patient management decisions. A negative result may occur with  improper specimen collection/handling, submission of specimen other than nasopharyngeal swab, presence of viral mutation(s) within the areas targeted by this assay, and inadequate number of viral copies(<138 copies/mL). A negative result must be combined with clinical observations, patient history, and epidemiological information. The expected result is Negative.  Fact Sheet for Patients:  EntrepreneurPulse.com.au  Fact Sheet for Healthcare Providers:  IncredibleEmployment.be  This test is no t yet approved or cleared by the [place] FDA and  has been authorized for detection and/or diagnosis of SARS-CoV-2 by FDA under an Emergency Use Authorization (EUA). This EUA will remain  in effect (meaning this test can be used) for the duration of the COVID-19 declaration under Section 564(b)(1) of the Act, 21 U.S.C.section 360bbb-3(b)(1), unless the authorization is terminated  or revoked sooner.       Influenza A by PCR NEGATIVE NEGATIVE Final   Influenza B by PCR NEGATIVE NEGATIVE Final    Comment: (NOTE) The Xpert Xpress SARS-CoV-2/FLU/RSV plus assay is intended as an aid in the diagnosis of influenza from Nasopharyngeal swab specimens and should not be used as a sole basis for treatment. Nasal washings and aspirates are unacceptable for Xpert Xpress SARS-CoV-2/FLU/RSV testing.  Fact Sheet for Patients: EntrepreneurPulse.com.au  Fact Sheet for Healthcare Providers: IncredibleEmployment.be  This test is not yet approved or cleared by the [place] FDA and has been authorized for detection and/or diagnosis of SARS-CoV-2 by FDA under an Emergency Use Authorization (EUA). This EUA will remain in effect (meaning this test can be used) for the  duration of the COVID-19 declaration under Section 564(b)(1) of the Act, 21 U.S.C. section 360bbb-3(b)(1), unless the authorization is terminated or revoked.  Performed at [facility], [place] [address], [place], [place] [address]   Culture, blood (single) w Reflex to ID Panel     Status: Abnormal (Preliminary result)   Collection Time: 11/03/21 10:42 AM   Specimen: BLOOD RIGHT HAND  Result Value Ref Range Status   Specimen Description BLOOD RIGHT HAND  Final   Special Requests   Final    BOTTLES DRAWN AEROBIC AND ANAEROBIC Blood Culture adequate volume   Culture  Setup Time   Final    GRAM NEGATIVE RODS ANAEROBIC BOTTLE ONLY Organism ID to follow CRITICAL RESULT CALLED TO, READ BACK BY AND VERIFIED WITH: T RUDISILL,[id number]  11/04/21 [place] Performed at [facility], [place] [address], [place], [place] [address]    Culture ESCHERICHIA COLI (A)  Final   Report Status PENDING  Incomplete  Blood Culture ID Panel (Reflexed)     Status: Abnormal   Collection Time: 11/03/21 10:42 AM  Result Value Ref Range Status   Enterococcus faecalis NOT DETECTED NOT DETECTED Final   Enterococcus Faecium NOT DETECTED NOT DETECTED Final   Listeria monocytogenes NOT DETECTED NOT DETECTED Final   Staphylococcus species NOT DETECTED NOT DETECTED Final   Staphylococcus aureus (BCID) NOT DETECTED NOT DETECTED Final   Staphylococcus epidermidis NOT DETECTED NOT DETECTED Final   Staphylococcus lugdunensis NOT DETECTED NOT DETECTED Final   Streptococcus species NOT DETECTED NOT DETECTED Final   Streptococcus agalactiae NOT DETECTED NOT DETECTED Final   Streptococcus pneumoniae NOT DETECTED NOT DETECTED Final   Streptococcus pyogenes NOT DETECTED NOT DETECTED Final   A.calcoaceticus-baumannii NOT DETECTED NOT DETECTED Final   Bacteroides fragilis NOT DETECTED NOT DETECTED Final   Enterobacterales DETECTED (A) NOT DETECTED Final    Comment: Enterobacterales represent a large order of gram negative  bacteria, not a single organism. CRITICAL RESULT CALLED TO, READ BACK BY AND VERIFIED WITH: T RUDISILL,[id number]  11/04/21 [place]    Enterobacter cloacae complex NOT DETECTED NOT DETECTED Final   Escherichia coli DETECTED (A) NOT DETECTED Final    Comment: CRITICAL RESULT CALLED TO, READ BACK BY AND VERIFIED WITH: T RUDISILL,[email or web address]  11/04/21 [place]    Klebsiella aerogenes NOT DETECTED NOT DETECTED Final   Klebsiella oxytoca NOT DETECTED NOT DETECTED Final   Klebsiella pneumoniae NOT DETECTED NOT DETECTED Final   Proteus species NOT DETECTED NOT DETECTED Final   Salmonella species NOT DETECTED NOT DETECTED Final   Serratia marcescens NOT DETECTED NOT DETECTED Final   Haemophilus influenzae NOT DETECTED NOT DETECTED Final   Neisseria meningitidis NOT DETECTED NOT DETECTED Final   Pseudomonas aeruginosa NOT DETECTED NOT DETECTED Final   Stenotrophomonas maltophilia NOT DETECTED NOT DETECTED Final   Candida albicans NOT DETECTED NOT DETECTED Final   Candida auris NOT DETECTED NOT DETECTED Final   Candida glabrata NOT DETECTED NOT DETECTED Final   Candida krusei NOT DETECTED NOT DETECTED Final   Candida parapsilosis NOT DETECTED NOT DETECTED Final   Candida tropicalis NOT DETECTED NOT DETECTED Final   Cryptococcus neoformans/gattii NOT DETECTED NOT DETECTED Final   CTX-M ESBL NOT DETECTED NOT DETECTED Final   Carbapenem resistance IMP NOT DETECTED NOT DETECTED Final   Carbapenem resistance KPC NOT DETECTED NOT DETECTED Final   Carbapenem resistance NDM NOT DETECTED NOT DETECTED Final   Carbapenem resist OXA 48 LIKE NOT DETECTED NOT DETECTED Final   Carbapenem resistance VIM NOT DETECTED NOT DETECTED Final    Comment: Performed at [facility], [address], [place], [place] [address]  MRSA Next Gen by PCR, Nasal     Status: None   Collection Time: 11/03/21  3:28 PM   Specimen: Nasal Mucosa; Nasal Swab  Result Value Ref Range Status   MRSA by PCR Next Gen NOT DETECTED NOT DETECTED  Final    Comment: (NOTE) The GeneXpert MRSA Assay (FDA approved for NASAL specimens only), is one component of a comprehensive MRSA colonization surveillance program. It is not intended to diagnose MRSA infection nor to guide or monitor treatment for MRSA infections. Test performance is not FDA approved in patients less than 17 years old. Performed at [facility], [place] [address], [place], [place] [address]       Studies: No  results found.  Scheduled Meds:  atorvastatin  20 mg Oral Daily   guaiFENesin  600 mg Oral BID   insulin aspart  0-5 Units Subcutaneous QHS   insulin aspart  0-9 Units Subcutaneous TID WC   sodium chloride flush  3 mL Intravenous Q12H   Warfarin - Pharmacist Dosing Inpatient   Does not apply q1600    Continuous Infusions:  azithromycin 500 mg (11/05/21 1144)   cefTRIAXone (ROCEPHIN)  IV 2 g (11/05/21 0913)   diltiazem (CARDIZEM) infusion 10 mg/hr (11/05/21 1708)     LOS: 2 days     Alma Friendly, MD [facility]  If 7PM-7AM, please contact night-coverage [email or web address] 11/05/2021, 6:18 PM

## 2021-11-05 NOTE — Plan of Care (Signed)

## 2021-11-05 NOTE — Progress Notes (Signed)
ANTICOAGULATION CONSULT NOTE - Follow up [place] for warfarin Indication: atrial fibrillation  Allergies  Allergen Reactions   Codeine Other (See Comments)    Unknown reaction, severe headach   Metformin And Related Diarrhea    Patient Measurements: Height: 5\' 9"  (175.3 cm) Weight: 75.2 kg (165 lb 11.2 oz) IBW/kg (Calculated) : 70.7  Vital Signs: Temp: 97.6 F (36.4 C) (09/17 1109) Temp Source: Oral (09/17 1109) BP: 126/96 (09/17 1109) Pulse Rate: 98 (09/17 1109)  Labs: Recent Labs    11/03/21 0821 11/03/21 0849 11/03/21 1300 11/04/21 0439 11/05/21 0813  HGB 11.3* 12.6*  --  9.8* 10.1*  HCT 35.8* 37.0*  --  29.0* 31.0*  PLT 147*  --   --  106* 131*  LABPROT 38.9*  --   --  55.4* 42.6*  INR 4.0*  --   --  6.3* 4.5*  CREATININE 2.26* 2.00*  --  1.89* 1.66*  CKTOTAL  --   --  104  --   --   TROPONINIHS  --   --  58*  --   --      Estimated Creatinine Clearance: 42.6 mL/min (A) (by C-G formula based on SCr of 1.66 mg/dL (H)).   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 Zigmund Daniel, laser therapy OD   Seizures ([place])    pt reports this is from low blood sugar   Assessment: 57 yom presented to the ED with weakness.  Afib RVR improved on diltiazem drip EF 50% He is on chronic warfarin for hx afib. INR is supratherapeutic 6>4.5 Hgb and platelets are slightly low but no bleeding noted.   Goal of Therapy:  INR 2-3 Monitor platelets by anticoagulation protocol: Yes   Plan:  No warfarin today Daily INR    Bonnita Nasuti Pharm.D. CPP, BCPS Clinical Pharmacist [phone] 11/05/2021 2:20 PM

## 2021-11-06 DIAGNOSIS — I480 Paroxysmal atrial fibrillation: Secondary | ICD-10-CM | POA: Diagnosis not present

## 2021-11-06 DIAGNOSIS — N179 Acute kidney failure, unspecified: Secondary | ICD-10-CM | POA: Diagnosis not present

## 2021-11-06 DIAGNOSIS — J690 Pneumonitis due to inhalation of food and vomit: Secondary | ICD-10-CM | POA: Diagnosis not present

## 2021-11-06 DIAGNOSIS — I5189 Other ill-defined heart diseases: Secondary | ICD-10-CM | POA: Diagnosis not present

## 2021-11-06 LAB — PROCALCITONIN: Procalcitonin: 0.16 ng/mL

## 2021-11-06 LAB — CBC WITH DIFFERENTIAL/PLATELET
Abs Immature Granulocytes: 0.01 10*3/uL (ref 0.00–0.07)
Basophils Absolute: 0 10*3/uL (ref 0.0–0.1)
Basophils Relative: 0 %
Eosinophils Absolute: 0 10*3/uL (ref 0.0–0.5)
Eosinophils Relative: 0 %
HCT: 30.3 % — ABNORMAL LOW (ref 39.0–52.0)
Hemoglobin: 10 g/dL — ABNORMAL LOW (ref 13.0–17.0)
Immature Granulocytes: 0 %
Lymphocytes Relative: 8 %
Lymphs Abs: 0.3 10*3/uL — ABNORMAL LOW (ref 0.7–4.0)
MCH: 26.6 pg (ref 26.0–34.0)
MCHC: 33 g/dL (ref 30.0–36.0)
MCV: 80.6 fL (ref 80.0–100.0)
Monocytes Absolute: 0.4 10*3/uL (ref 0.1–1.0)
Monocytes Relative: 11 %
Neutro Abs: 3 10*3/uL (ref 1.7–7.7)
Neutrophils Relative %: 81 %
Platelets: 144 10*3/uL — ABNORMAL LOW (ref 150–400)
RBC: 3.76 MIL/uL — ABNORMAL LOW (ref 4.22–5.81)
RDW: 15 % (ref 11.5–15.5)
WBC: 3.7 10*3/uL — ABNORMAL LOW (ref 4.0–10.5)
nRBC: 0 % (ref 0.0–0.2)

## 2021-11-06 LAB — BASIC METABOLIC PANEL
Anion gap: 12 (ref 5–15)
BUN: 17 mg/dL (ref 8–23)
CO2: 26 mmol/L (ref 22–32)
Calcium: 8.3 mg/dL — ABNORMAL LOW (ref 8.9–10.3)
Chloride: 96 mmol/L — ABNORMAL LOW (ref 98–111)
Creatinine, Ser: 1.68 mg/dL — ABNORMAL HIGH (ref 0.61–1.24)
GFR, Estimated: 44 mL/min — ABNORMAL LOW (ref >60)
Glucose, Bld: 147 mg/dL — ABNORMAL HIGH (ref 70–99)
Potassium: 3.3 mmol/L — ABNORMAL LOW (ref 3.5–5.1)
Sodium: 134 mmol/L — ABNORMAL LOW (ref 135–145)

## 2021-11-06 LAB — GLUCOSE, CAPILLARY
Glucose-Capillary: 128 mg/dL — ABNORMAL HIGH (ref 70–99)
Glucose-Capillary: 158 mg/dL — ABNORMAL HIGH (ref 70–99)
Glucose-Capillary: 142 mg/dL — ABNORMAL HIGH (ref 70–99)
Glucose-Capillary: 151 mg/dL — ABNORMAL HIGH (ref 70–99)
Glucose-Capillary: 167 mg/dL — ABNORMAL HIGH (ref 70–99)

## 2021-11-06 LAB — CULTURE, BLOOD (SINGLE): Special Requests: ADEQUATE

## 2021-11-06 LAB — PROTIME-INR
INR: 3 — ABNORMAL HIGH (ref 0.8–1.2)
Prothrombin Time: 30.9 seconds — ABNORMAL HIGH (ref 11.4–15.2)

## 2021-11-06 MED ORDER — POTASSIUM CHLORIDE CRYS ER 20 MEQ PO TBCR
40.0000 meq | EXTENDED_RELEASE_TABLET | Freq: Three times a day (TID) | ORAL | Status: AC
  Administered 2021-11-06 – 2021-11-07 (×3): 40 meq via ORAL
  Filled 2021-11-06 (×3): qty 2

## 2021-11-06 MED ORDER — WARFARIN SODIUM 5 MG PO TABS
5.0000 mg | ORAL_TABLET | Freq: Once | ORAL | Status: AC
  Administered 2021-11-06: 5 mg via ORAL
  Filled 2021-11-06: qty 1

## 2021-11-06 NOTE — Progress Notes (Signed)
PROGRESS NOTE  Brian [name] DJM:[id number] DOB: 12-Feb-1956 DOA: 11/03/2021 PCP: [name], MD  HPI/Recap of past 24 hours: Brian [name] is a 66 y.o. 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  He feels globally weak and has had at least 2 falls at home. Upon admission into the emergency department patient was noted to be afebrile in atrial fibrillation with heart rates elevated into the 160s. Chest x-ray noted patchy airspace opacity of the left lower lung.  Patient admitted for further management.    Today, patient denies any new complaints.  Heart rate still fluctuating despite being on Cardizem drip.    Assessment/Plan: Principal Problem:   Atrial fibrillation (HCC) Active Problems:   Hypokalemia   Aspiration pneumonia ([place])   Dysphagia   Fall at home, initial encounter   Acute kidney injury superimposed on chronic kidney disease (HCC)   Nausea, vomiting, and diarrhea   Essential hypertension, benign   Diastolic dysfunction   Diabetes mellitus, type II ([place])   History of CVA with residual deficit   Malignant neoplasm of base of tongue (HCC)   Pancytopenia (HCC)   Atrial fibrillation with RVR supratherapeutic INR Rate uncontrolled Started on Cardizem drip, held p.o. Cardizem, restarted Coreg Coumadin per pharmacy, restart Telemetry  ?Possible E. coli bacteremia 1 set of blood culture growing E. Coli, repeat pending Unsure of etiology Continue ceftriaxone for now   Hypokalemia Replace as needed   Suspected aspiration pneumonia dysphagia Possible CAP Currently afebrile, with no leukocytosis Intermittent cough and complaints of getting choked up with eating Procalcitonin  0.86, will trend CXR with left lower lobe opacity on chest x-ray Modified barium swallow with recommendations of a dysphagia 3 diet with thin liquids and meds with pure on 9/7 Aspiration precautions Continue Rocephin and azithromycin   Falls at home, initial encounter CT scan of the head did not note any acute abnormalities. PT/OT to evaluate and treat   Acute kidney injury superimposed on chronic kidney disease stage IIIb On admission creatinine elevated up to 2.26 with BUN 26 Baseline creatinine previously noted to be 1.73 back in July Avoid nephrotoxic agents at this time Daily BMP   Essential hypertension BP stable Restart home Coreg 25 mg twice daily, hold Cardizem long-acting 240 mg daily, hydralazine 50 mg 3 times daily, and losartan 100 mg daily for now Continue Cardizem drip   Chronic diastolic dysfunction Last EF noted to be 55-60% with grade 2 diastolic dysfunction by echocardiogram from 03/07/2021 Strict I&Os and daily weights   Diabetes mellitus type 2 Last hemoglobin A1c 6.6 SSI, Accu-Cheks, hypoglycemic protocol Hold home medication Actos 30 mg daily and Jardiance 25 mg daily  History of CVA with residual deficit Patient with prior history of stroke with residual left upper extremity weakness.   History of squamous cell carcinoma of base of the tongue Prior treatment with 7 weeks of radiation and chemotherapy.   Pancytopenia History of malignancy with past chemotherapy Monitor closely    Estimated body mass index is 24.6 kg/m as calculated from the following:   Height as of this encounter: 5\' 9"  (1.753 m).   Weight as of this encounter: 75.6 kg.     Code Status: Full  Family Communication: None at bedside  Disposition  Plan: Status is: Inpatient Remains inpatient appropriate because: Level of care      Consultants: None  Procedures: None  Antimicrobials: Ceftriaxone Azithromycin  DVT prophylaxis: Warfarin   Objective: Vitals:    11/05/21 2320 11/06/21 0200 11/06/21 0353 11/06/21 1254  BP: 122/84  121/83   Pulse: (!) 109  (!) 125   Resp: 18  14 17   Temp: 97.8 F (36.6 C)  97.7 F (36.5 C) 97.7 F (36.5 C)  TempSrc: Oral  Tympanic Oral  SpO2: 93%  94%   Weight:  75.6 kg    Height:        Intake/Output Summary (Last 24 hours) at 11/06/2021 1610 Last data filed at 11/06/2021 0600 Gross per 24 hour  Intake 516.87 ml  Output 1750 ml  Net -1233.13 ml   Filed Weights   11/04/21 0638 11/05/21 0400 11/06/21 0200  Weight: 74.2 kg 75.2 kg 75.6 kg    Exam: General: NAD, chronically ill-appearing, appears older than stated age Cardiovascular: S1, S2 present Respiratory: CTAB Abdomen: Soft, nontender, nondistended, bowel sounds present Musculoskeletal: No bilateral pedal edema noted Skin: Normal Psychiatry: Normal mood     Data Reviewed: CBC: Recent Labs  Lab 11/03/21 0821 11/03/21 0849 11/04/21 0439 11/05/21 0813 11/06/21 0724  WBC 7.9  --  5.6 4.7 3.7*  NEUTROABS 6.8  --   --  3.8 3.0  HGB 11.3* 12.6* 9.8* 10.1* 10.0*  HCT 35.8* 37.0* 29.0* 31.0* 30.3*  MCV 83.8  --  81.2 81.8 80.6  PLT 147*  --  106* 131* 563*   Basic Metabolic Panel: Recent Labs  Lab 11/03/21 0821 11/03/21 0849 11/04/21 0439 11/04/21 0750 11/05/21 0813 11/06/21 0724  NA 136 135 135  --  132* 134*  K 2.6* 2.6* 3.2*  --  3.2* 3.3*  CL 95* 97* 97*  --  99 96*  CO2 23  --  24  --  25 26  GLUCOSE 150* 148* 140*  --  153* 147*  BUN 26* 23 29*  --  22 17  CREATININE 2.26* 2.00* 1.89*  --  1.66* 1.68*  CALCIUM 9.1  --  8.7*  --  8.1* 8.3*  MG 1.7  --   --  2.2  --   --    GFR: Estimated Creatinine Clearance: 42.1 mL/min (A) (by C-G formula based on SCr of 1.68 mg/dL (H)). Liver Function Tests: Recent Labs  Lab 11/03/21 0821  AST 16  ALT 12  ALKPHOS 65  BILITOT 0.8  PROT 6.5  ALBUMIN 3.3*   No results for input(s): "LIPASE", "AMYLASE" in the last 168 hours. No results for input(s): "AMMONIA" in the last 168  hours. Coagulation Profile: Recent Labs  Lab 11/03/21 0821 11/04/21 0439 11/05/21 0813 11/06/21 0724  INR 4.0* 6.3* 4.5* 3.0*   Cardiac Enzymes: Recent Labs  Lab 11/03/21 1300  CKTOTAL 104   BNP (last 3 results) No results for input(s): "PROBNP" in the last 8760 hours. HbA1C: No results for input(s): "HGBA1C" in the last 72 hours.  CBG: Recent Labs  Lab 11/05/21 1111 11/05/21 1517 11/05/21 2132 11/06/21 0609 11/06/21 1125  GLUCAP 178* 174* 128* 158* 142*   Lipid Profile: No results for input(s): "CHOL", "HDL", "LDLCALC", "TRIG", "CHOLHDL", "LDLDIRECT" in the last 72 hours. Thyroid Function Tests: No results for input(s): "TSH", "T4TOTAL", "FREET4", "T3FREE", "THYROIDAB" in the last 72 hours. Anemia Panel: No results for input(s): "VITAMINB12", "FOLATE", "FERRITIN", "TIBC", "IRON", "RETICCTPCT" in the last 72 hours. Urine analysis:    Component  Value Date/Time   COLORURINE YELLOW 01/17/2013 0623   APPEARANCEUR CLEAR 01/17/2013 0623   LABSPEC 1.018 01/17/2013 0623   PHURINE 5.0 01/17/2013 0623   GLUCOSEU 250 (A) 01/17/2013 0623   HGBUR NEGATIVE 01/17/2013 0623   BILIRUBINUR NEGATIVE 01/17/2013 0623   KETONESUR NEGATIVE 01/17/2013 0623   PROTEINUR NEGATIVE 01/17/2013 0623   UROBILINOGEN 0.2 01/17/2013 0623   NITRITE NEGATIVE 01/17/2013 0623   LEUKOCYTESUR NEGATIVE 01/17/2013 0623   Sepsis Labs: @LABRCNTIP (procalcitonin:4,lacticidven:4)  ) Recent Results (from the past 240 hour(s))  Resp Panel by RT-PCR (Flu A&B, Covid) Anterior Nasal Swab     Status: None   Collection Time: 11/03/21  8:29 AM   Specimen: Anterior Nasal Swab  Result Value Ref Range Status   SARS Coronavirus 2 by RT PCR NEGATIVE NEGATIVE Final    Comment: (NOTE) SARS-CoV-2 target nucleic acids are NOT DETECTED.  The SARS-CoV-2 RNA is generally detectable in upper respiratory specimens during the acute phase of infection. The lowest concentration of SARS-CoV-2 viral copies this assay can  detect is 138 copies/mL. A negative result does not preclude SARS-Cov-2 infection and should not be used as the sole basis for treatment or other patient management decisions. A negative result may occur with  improper specimen collection/handling, submission of specimen other than nasopharyngeal swab, presence of viral mutation(s) within the areas targeted by this assay, and inadequate number of viral copies(<138 copies/mL). A negative result must be combined with clinical observations, patient history, and epidemiological information. The expected result is Negative.  Fact Sheet for Patients:  EntrepreneurPulse.com.au  Fact Sheet for Healthcare Providers:  IncredibleEmployment.be  This test is no t yet approved or cleared by the [place] FDA and  has been authorized for detection and/or diagnosis of SARS-CoV-2 by FDA under an Emergency Use Authorization (EUA). This EUA will remain  in effect (meaning this test can be used) for the duration of the COVID-19 declaration under Section 564(b)(1) of the Act, 21 U.S.C.section 360bbb-3(b)(1), unless the authorization is terminated  or revoked sooner.       Influenza A by PCR NEGATIVE NEGATIVE Final   Influenza B by PCR NEGATIVE NEGATIVE Final    Comment: (NOTE) The Xpert Xpress SARS-CoV-2/FLU/RSV plus assay is intended as an aid in the diagnosis of influenza from Nasopharyngeal swab specimens and should not be used as a sole basis for treatment. Nasal washings and aspirates are unacceptable for Xpert Xpress SARS-CoV-2/FLU/RSV testing.  Fact Sheet for Patients: EntrepreneurPulse.com.au  Fact Sheet for Healthcare Providers: IncredibleEmployment.be  This test is not yet approved or cleared by the [place] FDA and has been authorized for detection and/or diagnosis of SARS-CoV-2 by FDA under an Emergency Use Authorization (EUA). This EUA will remain in  effect (meaning this test can be used) for the duration of the COVID-19 declaration under Section 564(b)(1) of the Act, 21 U.S.C. section 360bbb-3(b)(1), unless the authorization is terminated or revoked.  Performed at [facility], [place] [address] [place], [place] [address]   Culture, blood (single) w Reflex to ID Panel     Status: Abnormal   Collection Time: 11/03/21 10:42 AM   Specimen: BLOOD RIGHT HAND  Result Value Ref Range Status   Specimen Description BLOOD RIGHT HAND  Final   Special Requests   Final    BOTTLES DRAWN AEROBIC AND ANAEROBIC Blood Culture adequate volume   Culture  Setup Time   Final    GRAM NEGATIVE RODS ANAEROBIC BOTTLE ONLY Organism ID to follow CRITICAL RESULT CALLED TO,  READ BACK BY AND VERIFIED WITH: T RUDISILL,[id number]  11/04/21 [place] Performed at [facility], [place] [address], [place], [place] [address]    Culture ESCHERICHIA COLI (A)  Final   Report Status 11/06/2021 FINAL  Final   Organism ID, Bacteria ESCHERICHIA COLI  Final      Susceptibility   Escherichia coli - MIC*    AMPICILLIN <=2 SENSITIVE Sensitive     CEFAZOLIN <=4 SENSITIVE Sensitive     CEFEPIME <=0.12 SENSITIVE Sensitive     CEFTAZIDIME <=1 SENSITIVE Sensitive     CEFTRIAXONE <=0.25 SENSITIVE Sensitive     CIPROFLOXACIN <=0.25 SENSITIVE Sensitive     GENTAMICIN <=1 SENSITIVE Sensitive     IMIPENEM <=0.25 SENSITIVE Sensitive     TRIMETH/SULFA <=20 SENSITIVE Sensitive     AMPICILLIN/SULBACTAM <=2 SENSITIVE Sensitive     PIP/TAZO <=4 SENSITIVE Sensitive     * ESCHERICHIA COLI  Blood Culture ID Panel (Reflexed)     Status: Abnormal   Collection Time: 11/03/21 10:42 AM  Result Value Ref Range Status   Enterococcus faecalis NOT DETECTED NOT DETECTED Final   Enterococcus Faecium NOT DETECTED NOT DETECTED Final   Listeria monocytogenes NOT DETECTED NOT DETECTED Final   Staphylococcus species NOT DETECTED NOT DETECTED Final   Staphylococcus aureus (BCID) NOT DETECTED NOT  DETECTED Final   Staphylococcus epidermidis NOT DETECTED NOT DETECTED Final   Staphylococcus lugdunensis NOT DETECTED NOT DETECTED Final   Streptococcus species NOT DETECTED NOT DETECTED Final   Streptococcus agalactiae NOT DETECTED NOT DETECTED Final   Streptococcus pneumoniae NOT DETECTED NOT DETECTED Final   Streptococcus pyogenes NOT DETECTED NOT DETECTED Final   A.calcoaceticus-baumannii NOT DETECTED NOT DETECTED Final   Bacteroides fragilis NOT DETECTED NOT DETECTED Final   Enterobacterales DETECTED (A) NOT DETECTED Final    Comment: Enterobacterales represent a large order of gram negative bacteria, not a single organism. CRITICAL RESULT CALLED TO, READ BACK BY AND VERIFIED WITH: T RUDISILL,[id number]  11/04/21 [place]    Enterobacter cloacae complex NOT DETECTED NOT DETECTED Final   Escherichia coli DETECTED (A) NOT DETECTED Final    Comment: CRITICAL RESULT CALLED TO, READ BACK BY AND VERIFIED WITH: T RUDISILL,[id number]  11/04/21 [place]    Klebsiella aerogenes NOT DETECTED NOT DETECTED Final   Klebsiella oxytoca NOT DETECTED NOT DETECTED Final   Klebsiella pneumoniae NOT DETECTED NOT DETECTED Final   Proteus species NOT DETECTED NOT DETECTED Final   Salmonella species NOT DETECTED NOT DETECTED Final   Serratia marcescens NOT DETECTED NOT DETECTED Final   Haemophilus influenzae NOT DETECTED NOT DETECTED Final   Neisseria meningitidis NOT DETECTED NOT DETECTED Final   Pseudomonas aeruginosa NOT DETECTED NOT DETECTED Final   Stenotrophomonas maltophilia NOT DETECTED NOT DETECTED Final   Candida albicans NOT DETECTED NOT DETECTED Final   Candida auris NOT DETECTED NOT DETECTED Final   Candida glabrata NOT DETECTED NOT DETECTED Final   Candida krusei NOT DETECTED NOT DETECTED Final   Candida parapsilosis NOT DETECTED NOT DETECTED Final   Candida tropicalis NOT DETECTED NOT DETECTED Final   Cryptococcus neoformans/gattii NOT DETECTED NOT DETECTED Final   CTX-M ESBL NOT DETECTED NOT  DETECTED Final   Carbapenem resistance IMP NOT DETECTED NOT DETECTED Final   Carbapenem resistance KPC NOT DETECTED NOT DETECTED Final   Carbapenem resistance NDM NOT DETECTED NOT DETECTED Final   Carbapenem resist OXA 48 LIKE NOT DETECTED NOT DETECTED Final   Carbapenem resistance VIM NOT DETECTED NOT DETECTED Final    Comment: Performed at [facility], [address], [place], [place] [address]  MRSA Next Gen by PCR, Nasal     Status: None   Collection Time: 11/03/21  3:28 PM   Specimen: Nasal Mucosa; Nasal Swab  Result Value Ref Range Status   MRSA by PCR Next Gen NOT DETECTED NOT DETECTED Final    Comment: (NOTE) The GeneXpert MRSA Assay (FDA approved for NASAL specimens only), is one component of a comprehensive MRSA colonization surveillance program. It is not intended to diagnose MRSA infection nor to guide or monitor treatment for MRSA infections. Test performance is not FDA approved in patients less than 46 years old. Performed at [facility], [place] [address] [place], [place] [address]   Culture, blood (Routine X 2) w Reflex to ID Panel     Status: None (Preliminary result)   Collection Time: 11/05/21  8:13 AM   Specimen: BLOOD  Result Value Ref Range Status   Specimen Description BLOOD SITE NOT SPECIFIED  Final   Special Requests   Final    BOTTLES DRAWN AEROBIC ONLY Blood Culture results may not be optimal due to an inadequate volume of blood received in culture bottles   Culture   Final    NO GROWTH < 24 HOURS Performed at [facility], [place] [address] [place], [place] [address]    Report Status PENDING  Incomplete  Culture, blood (Routine X 2) w Reflex to ID Panel     Status: None (Preliminary result)   Collection Time: 11/05/21  8:13 AM   Specimen: BLOOD  Result Value Ref Range Status   Specimen Description BLOOD SITE NOT SPECIFIED  Final   Special Requests   Final    BOTTLES DRAWN AEROBIC ONLY Blood Culture results may not be optimal due to an  inadequate volume of blood received in culture bottles   Culture   Final    NO GROWTH < 24 HOURS Performed at [facility], [place] [address] [place],  [address]    Report Status PENDING  Incomplete      Studies: No results found.  Scheduled Meds:  atorvastatin  20 mg Oral Daily   carvedilol  25 mg Oral BID WC   guaiFENesin  600 mg Oral BID   insulin aspart  0-5 Units Subcutaneous QHS   insulin aspart  0-9 Units Subcutaneous TID WC   potassium chloride  40 mEq Oral TID   sodium chloride flush  3 mL Intravenous Q12H   warfarin  5 mg Oral ONCE-1600   Warfarin - Pharmacist Dosing Inpatient   Does not apply q1600    Continuous Infusions:  azithromycin 500 mg (11/06/21 1147)   cefTRIAXone (ROCEPHIN)  IV 2 g (11/06/21 0949)   diltiazem (CARDIZEM) infusion 10 mg/hr (11/06/21 0853)     LOS: 3 days     Alma Friendly, MD [facility]  If 7PM-7AM, please contact night-coverage [email or web address] 11/06/2021, 4:10 PM

## 2021-11-06 NOTE — Plan of Care (Signed)
  Problem: Health Behavior/Discharge Planning: Goal: Ability to manage health-related needs will improve Outcome: Progressing   Problem: Skin Integrity: Goal: Risk for impaired skin integrity will decrease Outcome: Progressing   Problem: Tissue Perfusion: Goal: Adequacy of tissue perfusion will improve Outcome: Progressing   Problem: Education: Goal: Knowledge of General Education information will improve Description: Including pain rating scale, medication(s)/side effects and non-pharmacologic comfort measures Outcome: Progressing   Problem: Clinical Measurements: Goal: Will remain free from infection Outcome: Progressing Goal: Diagnostic test results will improve Outcome: Progressing Goal: Respiratory complications will improve Outcome: Progressing Goal: Cardiovascular complication will be avoided Outcome: Progressing

## 2021-11-06 NOTE — Progress Notes (Signed)
ANTICOAGULATION CONSULT NOTE - Follow Up Consult  Pharmacy Consult for Warfarin Indication: atrial fibrillation  Allergies  Allergen Reactions   Codeine Other (See Comments)    Unknown reaction, severe headach   Metformin And Related Diarrhea    Patient Measurements: Height: 5\' 9"  (175.3 cm) Weight: 75.6 kg (166 lb 9.6 oz) IBW/kg (Calculated) : 70.7  Vital Signs: Temp: 97.7 F (36.5 C) (09/18 0353) Temp Source: Tympanic (09/18 0353) BP: 121/83 (09/18 0353) Pulse Rate: 125 (09/18 0353)  Labs: Recent Labs    11/03/21 1300 11/04/21 0439 11/04/21 0439 11/05/21 0813 11/06/21 0724  HGB  --  9.8*   < > 10.1* 10.0*  HCT  --  29.0*  --  31.0* 30.3*  PLT  --  106*  --  131* 144*  LABPROT  --  55.4*  --  42.6* 30.9*  INR  --  6.3*  --  4.5* 3.0*  CREATININE  --  1.89*  --  1.66* 1.68*  CKTOTAL 104  --   --   --   --   TROPONINIHS 58*  --   --   --   --    < > = values in this interval not displayed.    Estimated Creatinine Clearance: 42.1 mL/min (A) (by C-G formula based on SCr of 1.68 mg/dL (H)).  Assessment: 66 yr old male continues on Warfarin as PTA for atrial fibrillation. INR supratherapeutic (4.0) on admit 11/03/21 and up to 6.3 on 9/16.  Warfarin doses have been held >> INR down to 3.0 today, upper-range therapeutic. Day # 4 Azithromycin for CAP, sometimes increases INR, but expect INR to trend down more by am.  Platelet count low stable, no bleeding reported.  PTA warfarin regimen: 5 mg 4 times a week and 10 mg 3 times a week.  Goal of Therapy:  INR 2-3 Monitor platelets by anticoagulation protocol: Yes   Plan:  Warfarin 5 mg PO x 1 today. Daily PT/INR. Monitor for signs/symptoms of bleeding. Follow up Azithromycin stop time.  Arty Baumgartner, [place] 11/06/2021,12:54 PM

## 2021-11-07 DIAGNOSIS — I480 Paroxysmal atrial fibrillation: Secondary | ICD-10-CM | POA: Diagnosis not present

## 2021-11-07 DIAGNOSIS — J690 Pneumonitis due to inhalation of food and vomit: Secondary | ICD-10-CM | POA: Diagnosis not present

## 2021-11-07 DIAGNOSIS — N179 Acute kidney failure, unspecified: Secondary | ICD-10-CM | POA: Diagnosis not present

## 2021-11-07 DIAGNOSIS — I5189 Other ill-defined heart diseases: Secondary | ICD-10-CM | POA: Diagnosis not present

## 2021-11-07 LAB — GLUCOSE, CAPILLARY
Glucose-Capillary: 146 mg/dL — ABNORMAL HIGH (ref 70–99)
Glucose-Capillary: 136 mg/dL — ABNORMAL HIGH (ref 70–99)
Glucose-Capillary: 163 mg/dL — ABNORMAL HIGH (ref 70–99)
Glucose-Capillary: 132 mg/dL — ABNORMAL HIGH (ref 70–99)

## 2021-11-07 LAB — CBC WITH DIFFERENTIAL/PLATELET
Abs Immature Granulocytes: 0.03 10*3/uL (ref 0.00–0.07)
Basophils Absolute: 0 10*3/uL (ref 0.0–0.1)
Basophils Relative: 0 %
Eosinophils Absolute: 0 10*3/uL (ref 0.0–0.5)
Eosinophils Relative: 0 %
HCT: 29.5 % — ABNORMAL LOW (ref 39.0–52.0)
Hemoglobin: 9.6 g/dL — ABNORMAL LOW (ref 13.0–17.0)
Immature Granulocytes: 1 %
Lymphocytes Relative: 11 %
Lymphs Abs: 0.4 10*3/uL — ABNORMAL LOW (ref 0.7–4.0)
MCH: 26.4 pg (ref 26.0–34.0)
MCHC: 32.5 g/dL (ref 30.0–36.0)
MCV: 81.3 fL (ref 80.0–100.0)
Monocytes Absolute: 0.5 10*3/uL (ref 0.1–1.0)
Monocytes Relative: 11 %
Neutro Abs: 3.1 10*3/uL (ref 1.7–7.7)
Neutrophils Relative %: 77 %
Platelets: 136 10*3/uL — ABNORMAL LOW (ref 150–400)
RBC: 3.63 MIL/uL — ABNORMAL LOW (ref 4.22–5.81)
RDW: 14.9 % (ref 11.5–15.5)
WBC: 4 10*3/uL (ref 4.0–10.5)
nRBC: 0 % (ref 0.0–0.2)

## 2021-11-07 LAB — BASIC METABOLIC PANEL
Anion gap: 9 (ref 5–15)
BUN: 16 mg/dL (ref 8–23)
CO2: 26 mmol/L (ref 22–32)
Calcium: 8.3 mg/dL — ABNORMAL LOW (ref 8.9–10.3)
Chloride: 99 mmol/L (ref 98–111)
Creatinine, Ser: 1.52 mg/dL — ABNORMAL HIGH (ref 0.61–1.24)
GFR, Estimated: 50 mL/min — ABNORMAL LOW (ref >60)
Glucose, Bld: 140 mg/dL — ABNORMAL HIGH (ref 70–99)
Potassium: 4 mmol/L (ref 3.5–5.1)
Sodium: 134 mmol/L — ABNORMAL LOW (ref 135–145)

## 2021-11-07 LAB — PROTIME-INR
INR: 2.5 — ABNORMAL HIGH (ref 0.8–1.2)
Prothrombin Time: 27 seconds — ABNORMAL HIGH (ref 11.4–15.2)

## 2021-11-07 LAB — MAGNESIUM: Magnesium: 1.7 mg/dL (ref 1.7–2.4)

## 2021-11-07 MED ORDER — WARFARIN SODIUM 5 MG PO TABS
10.0000 mg | ORAL_TABLET | Freq: Once | ORAL | Status: AC
  Administered 2021-11-07: 10 mg via ORAL
  Filled 2021-11-07: qty 2

## 2021-11-07 NOTE — Progress Notes (Signed)
ANTICOAGULATION CONSULT NOTE - Follow Up Consult  Pharmacy Consult for Warfarin Indication: atrial fibrillation  Allergies  Allergen Reactions   Codeine Other (See Comments)    Unknown reaction, severe headach   Metformin And Related Diarrhea    Patient Measurements: Height: 5\' 9"  (175.3 cm) Weight: 75.3 kg (166 lb 0.1 oz) IBW/kg (Calculated) : 70.7  Vital Signs: Temp: 98 F (36.7 C) (09/19 0344) Temp Source: Oral (09/19 0344) BP: 123/81 (09/19 0746) Pulse Rate: 91 (09/19 0344)  Labs: Recent Labs    11/05/21 0813 11/06/21 0724 11/07/21 0636  HGB 10.1* 10.0* 9.6*  HCT 31.0* 30.3* 29.5*  PLT 131* 144* 136*  LABPROT 42.6* 30.9* 27.0*  INR 4.5* 3.0* 2.5*  CREATININE 1.66* 1.68* 1.52*     Estimated Creatinine Clearance: 46.5 mL/min (A) (by C-G formula based on SCr of 1.52 mg/dL (H)).  Assessment: 66 yr old male continues on Warfarin as PTA for atrial fibrillation. INR supratherapeutic (4.0) on admit 11/03/21 and up to 6.3 on 9/16.  Warfarin doses have been held >> INR down to 3.0 on 9/18 and Warfarin resumed with 5 mg x 1, as further drop expected after holding x 3 days. INR 2.5, remains therapeutic.    Day # 5 Azithromycin for CAP, sometimes increases INR.    Platelet count low stable, no bleeding reported.  PTA warfarin regimen: 5 mg 4 times a week and 10 mg 3 times a week.  Goal of Therapy:  INR 2-3 Monitor platelets by anticoagulation protocol: Yes   Plan:  Warfarin 10 mg PO x 1 today. Daily PT/INR. Monitor for signs/symptoms of bleeding. Follow up Azithromycin stop time.   Arty Baumgartner, RPh 11/07/2021,9:57 AM

## 2021-11-07 NOTE — Progress Notes (Signed)
Physical Therapy Treatment Patient Details Name: Brian [name] MRN: [id number] DOB: Jan 13, 1956 Today's Date: 11/07/2021   History of Present Illness 66 yo male admitted 9/15 with weakness, diarrhea and falls in Afib. PMhx: CVA, HTN, HLD, CKD, DM, tongue CA.    PT Comments    Pt received supine and agreeable to session with good progress towards acute goals. Session focused on continued gait and transfers training with pt mobilizing with up to min guard throughout. Pt with increased ambulation tolerance with use of IV pole for stability, with pt endorsing interest in trailing SPC next cane for increased stability. Pt with noted increase in stability with increased distance. Pt declining stair training this session secondary to fatigue. Pt receptive to education re; activity recommendations and benefits and importance of continued mobility. Pt continues to benefit from skilled PT services to progress toward functional mobility goals.    Recommendations for follow up therapy are one component of a multi-disciplinary discharge planning process, led by the attending physician.  Recommendations may be updated based on patient status, additional functional criteria and insurance authorization.  Follow Up Recommendations  Home health PT     Assistance Recommended at Discharge PRN  Patient can return home with the following Assistance with cooking/housework;A little help with bathing/dressing/bathroom;A little help with walking and/or transfers   Equipment Recommendations  Cane    Recommendations for Other Services       Precautions / Restrictions Precautions Precautions: None Restrictions Weight Bearing Restrictions: No     Mobility  Bed Mobility Overal bed mobility: Modified Independent             General bed mobility comments: use of rails    Transfers Overall transfer level: Needs assistance Equipment used: None Transfers: Sit to/from Stand, Bed to  chair/wheelchair/BSC Sit to Stand: Supervision           General transfer comment: guarding for safety to stand from bed    Ambulation/Gait Ambulation/Gait assistance: Min assist, Min guard Gait Distance (Feet): 260 Feet Assistive device: None, IV Pole Gait Pattern/deviations: Step-through pattern, Decreased stride length, Decreased stance time - left Gait velocity: decr     General Gait Details: pt with slightly hemiparetic gait with decreased arm swing left and decreased stance left. x1 LOB with pt able to self correct with stepping strategie, pt holding IV pole for increased stability after initial 20'   Stairs         General stair comments: pt declining stair negotiation this date   Wheelchair Mobility    Modified Rankin (Stroke Patients Only)       Balance Overall balance assessment: Mild deficits observed, not formally tested   Sitting balance-Leahy Scale: Fair Sitting balance - Comments: pt unable to don/doff socks EOB     Standing balance-Leahy Scale: Good Standing balance comment: static standing with guarding and gait                            Cognition Arousal/Alertness: Awake/alert Behavior During Therapy: WFL for tasks assessed/performed Overall Cognitive Status: Within Functional Limits for tasks assessed Area of Impairment: Safety/judgement                         Safety/Judgement: Decreased awareness of deficits              Exercises      General Comments General comments (skin integrity, edema, etc.): pt agreeable to time up  in chair at end of session, encouraged continued mobility with pt verbalizing understanding      Pertinent Vitals/Pain Pain Assessment Pain Assessment: No/denies pain Faces Pain Scale: No hurt    Home Living                          Prior Function            PT Goals (current goals can now be found in the care plan section) Acute Rehab PT Goals Patient Stated Goal:  return home PT Goal Formulation: With patient Time For Goal Achievement: 11/18/21    Frequency    Min 3X/week      PT Plan      Co-evaluation              AM-PAC PT "6 Clicks" Mobility   Outcome Measure  Help needed turning from your back to your side while in a flat bed without using bedrails?: A Little Help needed moving from lying on your back to sitting on the side of a flat bed without using bedrails?: A Little Help needed moving to and from a bed to a chair (including a wheelchair)?: A Little Help needed standing up from a chair using your arms (e.g., wheelchair or bedside chair)?: A Little Help needed to walk in hospital room?: A Little Help needed climbing 3-5 steps with a railing? : A Lot 6 Click Score: 17    End of Session   Activity Tolerance: Patient tolerated treatment well Patient left: in chair;with call bell/phone within reach Nurse Communication: Mobility status PT Visit Diagnosis: Other abnormalities of gait and mobility (R26.89);Difficulty in walking, not elsewhere classified (R26.2);History of falling (Z91.81)     Time: 4562-5638 PT Time Calculation (min) (ACUTE ONLY): 17 min  Charges:  $Gait Training: 8-22 mins                     Brian [name] R. PTA Acute Rehabilitation Services Office: [place] 11/07/2021, 2:18 PM

## 2021-11-07 NOTE — Progress Notes (Signed)
PROGRESS NOTE  Brian [name] CBJ:[id number] DOB: 1956-02-17 DOA: 11/03/2021 PCP: [name], MD  HPI/Recap of past 24 hours: Brian [name] is a 66 y.o. 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  He feels globally weak and has had at least 2 falls at home. Upon admission into the emergency department patient was noted to be afebrile in atrial fibrillation with heart rates elevated into the 160s. Chest x-ray noted patchy airspace opacity of the left lower lung.  Patient admitted for further management.    Today, patient denies any new complaints.  Patient still needing Cardizem drip, heart rate slowly improving.  Patient denies any chest pain, shortness of breath.    Assessment/Plan: Principal Problem:   Atrial fibrillation (HCC) Active Problems:   Hypokalemia   Aspiration pneumonia ([place])   Dysphagia   Fall at home, initial encounter   Acute kidney injury superimposed on chronic kidney disease (HCC)   Nausea, vomiting, and diarrhea   Essential hypertension, benign   Diastolic dysfunction   Diabetes mellitus, type II ([place])   History of CVA with residual deficit   Malignant neoplasm of base of tongue (HCC)   Pancytopenia (HCC)   Atrial fibrillation with RVR supratherapeutic INR Rate uncontrolled Started on Cardizem drip, held p.o. Cardizem, restarted Coreg Will order repeat ECHO pending Coumadin per pharmacy, restart Telemetry  ?Possible E. coli bacteremia 1 set of blood culture growing E. Coli, repeat NGTD Unsure of etiology Continue ceftriaxone for a total of 7 days   Hypokalemia Replace as needed   Suspected aspiration pneumonia dysphagia Possible CAP Currently afebrile,  with no leukocytosis Intermittent cough and complaints of getting choked up with eating Procalcitonin 0.86, will trend CXR with left lower lobe opacity on chest x-ray Modified barium swallow with recommendations of a dysphagia 3 diet with thin liquids and meds with pure on 9/7 Aspiration precautions Continue Rocephin, s/p azithromycin   Falls at home, initial encounter CT scan of the head did not note any acute abnormalities. PT/OT to evaluate and treat   Acute kidney injury superimposed on chronic kidney disease stage IIIb On admission creatinine elevated up to 2.26 with BUN 26 Baseline creatinine previously noted to be 1.73 back in July Avoid nephrotoxic agents at this time Daily BMP   Essential hypertension BP stable Restart home Coreg 25 mg twice daily, hold Cardizem long-acting 240 mg daily, hydralazine 50 mg 3 times daily, and losartan 100 mg daily for now Continue Cardizem drip   Chronic diastolic dysfunction Last EF noted to be 55-60% with grade 2 diastolic dysfunction by echocardiogram from 03/07/2021 Repeat ECHO pending Strict I&Os and daily weights   Diabetes mellitus type 2 Last hemoglobin A1c 6.6 SSI, Accu-Cheks, hypoglycemic protocol Hold home medication Actos 30 mg daily and Jardiance 25 mg daily  History of CVA with residual deficit Patient with prior history of stroke with residual left upper extremity weakness   History of squamous cell carcinoma of base of the tongue Prior treatment with 7 weeks of radiation and chemotherapy   Pancytopenia History of malignancy with past chemotherapy Monitor closely    Estimated body mass index is 24.51 kg/m as calculated from the following:   Height as of this encounter: 5\' 9"  (1.753 m).   Weight  as of this encounter: 75.3 kg.     Code Status: Full  Family Communication: None at bedside  Disposition Plan: Status is: Inpatient Remains inpatient appropriate because: Level of  care      Consultants: None  Procedures: None  Antimicrobials: Ceftriaxone Azithromycin  DVT prophylaxis: Warfarin   Objective: Vitals:   11/07/21 0344 11/07/21 0500 11/07/21 0746 11/07/21 1259  BP: 128/79  123/81 (!) 117/91  Pulse: 91     Resp: 16  19 20   Temp: 98 F (36.7 C)   98.3 F (36.8 C)  TempSrc: Oral   Oral  SpO2: 97%     Weight:  75.3 kg    Height:        Intake/Output Summary (Last 24 hours) at 11/07/2021 1506 Last data filed at 11/07/2021 1039 Gross per 24 hour  Intake 874.16 ml  Output 400 ml  Net 474.16 ml   Filed Weights   11/05/21 0400 11/06/21 0200 11/07/21 0500  Weight: 75.2 kg 75.6 kg 75.3 kg    Exam: General: NAD, chronically ill-appearing, appears older than stated age Cardiovascular: S1, S2 present Respiratory: CTAB Abdomen: Soft, nontender, nondistended, bowel sounds present Musculoskeletal: No bilateral pedal edema noted Skin: Normal Psychiatry: Normal mood     Data Reviewed: CBC: Recent Labs  Lab 11/03/21 0821 11/03/21 0849 11/04/21 0439 11/05/21 0813 11/06/21 0724 11/07/21 0636  WBC 7.9  --  5.6 4.7 3.7* 4.0  NEUTROABS 6.8  --   --  3.8 3.0 3.1  HGB 11.3* 12.6* 9.8* 10.1* 10.0* 9.6*  HCT 35.8* 37.0* 29.0* 31.0* 30.3* 29.5*  MCV 83.8  --  81.2 81.8 80.6 81.3  PLT 147*  --  106* 131* 144* 299*   Basic Metabolic Panel: Recent Labs  Lab 11/03/21 0821 11/03/21 0849 11/04/21 0439 11/04/21 0750 11/05/21 0813 11/06/21 0724 11/07/21 0636  NA 136 135 135  --  132* 134* 134*  K 2.6* 2.6* 3.2*  --  3.2* 3.3* 4.0  CL 95* 97* 97*  --  99 96* 99  CO2 23  --  24  --  25 26 26   GLUCOSE 150* 148* 140*  --  153* 147* 140*  BUN 26* 23 29*  --  22 17 16   CREATININE 2.26* 2.00* 1.89*  --  1.66* 1.68* 1.52*  CALCIUM 9.1  --  8.7*  --  8.1* 8.3* 8.3*  MG 1.7  --   --  2.2  --   --  1.7   GFR: Estimated Creatinine Clearance: 46.5 mL/min (A) (by C-G formula based on SCr of 1.52 mg/dL (H)). Liver Function Tests: Recent  Labs  Lab 11/03/21 0821  AST 16  ALT 12  ALKPHOS 65  BILITOT 0.8  PROT 6.5  ALBUMIN 3.3*   No results for input(s): "LIPASE", "AMYLASE" in the last 168 hours. No results for input(s): "AMMONIA" in the last 168 hours. Coagulation Profile: Recent Labs  Lab 11/03/21 0821 11/04/21 0439 11/05/21 0813 11/06/21 0724 11/07/21 0636  INR 4.0* 6.3* 4.5* 3.0* 2.5*   Cardiac Enzymes: Recent Labs  Lab 11/03/21 1300  CKTOTAL 104   BNP (last 3 results) No results for input(s): "PROBNP" in the last 8760 hours. HbA1C: No results for input(s): "HGBA1C" in the last 72 hours.  CBG: Recent Labs  Lab 11/06/21 1125 11/06/21 1601 11/06/21 2125 11/07/21 0629 11/07/21 1134  GLUCAP 142* 151* 167* 146* 136*   Lipid Profile: No results for input(s): "CHOL", "HDL", "LDLCALC", "TRIG", "CHOLHDL", "LDLDIRECT" in the last 72 hours. Thyroid  Function Tests: No results for input(s): "TSH", "T4TOTAL", "FREET4", "T3FREE", "THYROIDAB" in the last 72 hours. Anemia Panel: No results for input(s): "VITAMINB12", "FOLATE", "FERRITIN", "TIBC", "IRON", "RETICCTPCT" in the last 72 hours. Urine analysis:    Component Value Date/Time   COLORURINE YELLOW 01/17/2013 0623   APPEARANCEUR CLEAR 01/17/2013 0623   LABSPEC 1.018 01/17/2013 0623   PHURINE 5.0 01/17/2013 0623   GLUCOSEU 250 (A) 01/17/2013 0623   HGBUR NEGATIVE 01/17/2013 0623   BILIRUBINUR NEGATIVE 01/17/2013 0623   KETONESUR NEGATIVE 01/17/2013 0623   PROTEINUR NEGATIVE 01/17/2013 0623   UROBILINOGEN 0.2 01/17/2013 0623   NITRITE NEGATIVE 01/17/2013 0623   LEUKOCYTESUR NEGATIVE 01/17/2013 0623   Sepsis Labs: @LABRCNTIP (procalcitonin:4,lacticidven:4)  ) Recent Results (from the past 240 hour(s))  Resp Panel by RT-PCR (Flu A&B, Covid) Anterior Nasal Swab     Status: None   Collection Time: 11/03/21  8:29 AM   Specimen: Anterior Nasal Swab  Result Value Ref Range Status   SARS Coronavirus 2 by RT PCR NEGATIVE NEGATIVE Final    Comment:  (NOTE) SARS-CoV-2 target nucleic acids are NOT DETECTED.  The SARS-CoV-2 RNA is generally detectable in upper respiratory specimens during the acute phase of infection. The lowest concentration of SARS-CoV-2 viral copies this assay can detect is 138 copies/mL. A negative result does not preclude SARS-Cov-2 infection and should not be used as the sole basis for treatment or other patient management decisions. A negative result may occur with  improper specimen collection/handling, submission of specimen other than nasopharyngeal swab, presence of viral mutation(s) within the areas targeted by this assay, and inadequate number of viral copies(<138 copies/mL). A negative result must be combined with clinical observations, patient history, and epidemiological information. The expected result is Negative.  Fact Sheet for Patients:  EntrepreneurPulse.com.au  Fact Sheet for Healthcare Providers:  IncredibleEmployment.be  This test is no t yet approved or cleared by the [place] FDA and  has been authorized for detection and/or diagnosis of SARS-CoV-2 by FDA under an Emergency Use Authorization (EUA). This EUA will remain  in effect (meaning this test can be used) for the duration of the COVID-19 declaration under Section 564(b)(1) of the Act, 21 U.S.C.section 360bbb-3(b)(1), unless the authorization is terminated  or revoked sooner.       Influenza A by PCR NEGATIVE NEGATIVE Final   Influenza B by PCR NEGATIVE NEGATIVE Final    Comment: (NOTE) The Xpert Xpress SARS-CoV-2/FLU/RSV plus assay is intended as an aid in the diagnosis of influenza from Nasopharyngeal swab specimens and should not be used as a sole basis for treatment. Nasal washings and aspirates are unacceptable for Xpert Xpress SARS-CoV-2/FLU/RSV testing.  Fact Sheet for Patients: EntrepreneurPulse.com.au  Fact Sheet for Healthcare  Providers: IncredibleEmployment.be  This test is not yet approved or cleared by the [place] FDA and has been authorized for detection and/or diagnosis of SARS-CoV-2 by FDA under an Emergency Use Authorization (EUA). This EUA will remain in effect (meaning this test can be used) for the duration of the COVID-19 declaration under Section 564(b)(1) of the Act, 21 U.S.C. section 360bbb-3(b)(1), unless the authorization is terminated or revoked.  Performed at [facility], [place] [address], [place], [place] [address]   Culture, blood (single) w Reflex to ID Panel     Status: Abnormal   Collection Time: 11/03/21 10:42 AM   Specimen: BLOOD RIGHT HAND  Result Value Ref Range Status   Specimen Description BLOOD RIGHT HAND  Final   Special Requests  Final    BOTTLES DRAWN AEROBIC AND ANAEROBIC Blood Culture adequate volume   Culture  Setup Time   Final    GRAM NEGATIVE RODS ANAEROBIC BOTTLE ONLY Organism ID to follow CRITICAL RESULT CALLED TO, READ BACK BY AND VERIFIED WITH: T RUDISILL,[id number]  11/04/21 [place] Performed at [facility], [address] [place], [place] [address]    Culture ESCHERICHIA COLI (A)  Final   Report Status 11/06/2021 FINAL  Final   Organism ID, Bacteria ESCHERICHIA COLI  Final      Susceptibility   Escherichia coli - MIC*    AMPICILLIN <=2 SENSITIVE Sensitive     CEFAZOLIN <=4 SENSITIVE Sensitive     CEFEPIME <=0.12 SENSITIVE Sensitive     CEFTAZIDIME <=1 SENSITIVE Sensitive     CEFTRIAXONE <=0.25 SENSITIVE Sensitive     CIPROFLOXACIN <=0.25 SENSITIVE Sensitive     GENTAMICIN <=1 SENSITIVE Sensitive     IMIPENEM <=0.25 SENSITIVE Sensitive     TRIMETH/SULFA <=20 SENSITIVE Sensitive     AMPICILLIN/SULBACTAM <=2 SENSITIVE Sensitive     PIP/TAZO <=4 SENSITIVE Sensitive     * ESCHERICHIA COLI  Blood Culture ID Panel (Reflexed)     Status: Abnormal   Collection Time: 11/03/21 10:42 AM  Result Value Ref Range Status    Enterococcus faecalis NOT DETECTED NOT DETECTED Final   Enterococcus Faecium NOT DETECTED NOT DETECTED Final   Listeria monocytogenes NOT DETECTED NOT DETECTED Final   Staphylococcus species NOT DETECTED NOT DETECTED Final   Staphylococcus aureus (BCID) NOT DETECTED NOT DETECTED Final   Staphylococcus epidermidis NOT DETECTED NOT DETECTED Final   Staphylococcus lugdunensis NOT DETECTED NOT DETECTED Final   Streptococcus species NOT DETECTED NOT DETECTED Final   Streptococcus agalactiae NOT DETECTED NOT DETECTED Final   Streptococcus pneumoniae NOT DETECTED NOT DETECTED Final   Streptococcus pyogenes NOT DETECTED NOT DETECTED Final   A.calcoaceticus-baumannii NOT DETECTED NOT DETECTED Final   Bacteroides fragilis NOT DETECTED NOT DETECTED Final   Enterobacterales DETECTED (A) NOT DETECTED Final    Comment: Enterobacterales represent a large order of gram negative bacteria, not a single organism. CRITICAL RESULT CALLED TO, READ BACK BY AND VERIFIED WITH: T RUDISILL,PHARMD@[id number]  11/04/21 [place]    Enterobacter cloacae complex NOT DETECTED NOT DETECTED Final   Escherichia coli DETECTED (A) NOT DETECTED Final    Comment: CRITICAL RESULT CALLED TO, READ BACK BY AND VERIFIED WITH: T RUDISILL,PHARMD@[id number]  11/04/21 [place]    Klebsiella aerogenes NOT DETECTED NOT DETECTED Final   Klebsiella oxytoca NOT DETECTED NOT DETECTED Final   Klebsiella pneumoniae NOT DETECTED NOT DETECTED Final   Proteus species NOT DETECTED NOT DETECTED Final   Salmonella species NOT DETECTED NOT DETECTED Final   Serratia marcescens NOT DETECTED NOT DETECTED Final   Haemophilus influenzae NOT DETECTED NOT DETECTED Final   Neisseria meningitidis NOT DETECTED NOT DETECTED Final   Pseudomonas aeruginosa NOT DETECTED NOT DETECTED Final   Stenotrophomonas maltophilia NOT DETECTED NOT DETECTED Final   Candida albicans NOT DETECTED NOT DETECTED Final   Candida auris NOT DETECTED NOT DETECTED Final   Candida glabrata NOT DETECTED NOT  DETECTED Final   Candida krusei NOT DETECTED NOT DETECTED Final   Candida parapsilosis NOT DETECTED NOT DETECTED Final   Candida tropicalis NOT DETECTED NOT DETECTED Final   Cryptococcus neoformans/gattii NOT DETECTED NOT DETECTED Final   CTX-M ESBL NOT DETECTED NOT DETECTED Final   Carbapenem resistance IMP NOT DETECTED NOT DETECTED Final   Carbapenem resistance KPC NOT DETECTED NOT DETECTED Final  Carbapenem resistance NDM NOT DETECTED NOT DETECTED Final   Carbapenem resist OXA 48 LIKE NOT DETECTED NOT DETECTED Final   Carbapenem resistance VIM NOT DETECTED NOT DETECTED Final    Comment: Performed at [facility], [place] [address], [place], [place] [address]  MRSA Next Gen by PCR, Nasal     Status: None   Collection Time: 11/03/21  3:28 PM   Specimen: Nasal Mucosa; Nasal Swab  Result Value Ref Range Status   MRSA by PCR Next Gen NOT DETECTED NOT DETECTED Final    Comment: (NOTE) The GeneXpert MRSA Assay (FDA approved for NASAL specimens only), is one component of a comprehensive MRSA colonization surveillance program. It is not intended to diagnose MRSA infection nor to guide or monitor treatment for MRSA infections. Test performance is not FDA approved in patients less than 68 years old. Performed at [facility], [place] [address], [place], [place] [address]   Culture, blood (Routine X 2) w Reflex to ID Panel     Status: None (Preliminary result)   Collection Time: 11/05/21  8:13 AM   Specimen: BLOOD  Result Value Ref Range Status   Specimen Description BLOOD SITE NOT SPECIFIED  Final   Special Requests   Final    BOTTLES DRAWN AEROBIC ONLY Blood Culture results may not be optimal due to an inadequate volume of blood received in culture bottles   Culture   Final    NO GROWTH 2 DAYS Performed at [facility], [place] [address] [place], [place] [address]    Report Status PENDING  Incomplete  Culture, blood (Routine X 2) w Reflex to ID Panel     Status: None  (Preliminary result)   Collection Time: 11/05/21  8:13 AM   Specimen: BLOOD  Result Value Ref Range Status   Specimen Description BLOOD SITE NOT SPECIFIED  Final   Special Requests   Final    BOTTLES DRAWN AEROBIC ONLY Blood Culture results may not be optimal due to an inadequate volume of blood received in culture bottles   Culture   Final    NO GROWTH 2 DAYS Performed at [facility], [place] [address], [place], [place] [address]    Report Status PENDING  Incomplete      Studies: No results found.  Scheduled Meds:  atorvastatin  20 mg Oral Daily   carvedilol  25 mg Oral BID WC   guaiFENesin  600 mg Oral BID   insulin aspart  0-5 Units Subcutaneous QHS   insulin aspart  0-9 Units Subcutaneous TID WC   sodium chloride flush  3 mL Intravenous Q12H   warfarin  10 mg Oral ONCE-1600   Warfarin - Pharmacist Dosing Inpatient   Does not apply q1600    Continuous Infusions:  azithromycin 500 mg (11/07/21 1050)   cefTRIAXone (ROCEPHIN)  IV 2 g (11/07/21 1002)   diltiazem (CARDIZEM) infusion 10 mg/hr (11/07/21 1047)     LOS: 4 days     Alma Friendly, MD [facility]  If 7PM-7AM, please contact night-coverage [email or web address] 11/07/2021, 3:06 PM

## 2021-11-07 NOTE — Plan of Care (Signed)

## 2021-11-08 ENCOUNTER — Inpatient Hospital Stay (HOSPITAL_COMMUNITY): Payer: Medicare HMO

## 2021-11-08 DIAGNOSIS — J690 Pneumonitis due to inhalation of food and vomit: Secondary | ICD-10-CM | POA: Diagnosis not present

## 2021-11-08 DIAGNOSIS — I4891 Unspecified atrial fibrillation: Secondary | ICD-10-CM | POA: Diagnosis not present

## 2021-11-08 DIAGNOSIS — E1169 Type 2 diabetes mellitus with other specified complication: Secondary | ICD-10-CM | POA: Diagnosis not present

## 2021-11-08 DIAGNOSIS — I480 Paroxysmal atrial fibrillation: Secondary | ICD-10-CM | POA: Diagnosis not present

## 2021-11-08 DIAGNOSIS — N179 Acute kidney failure, unspecified: Secondary | ICD-10-CM | POA: Diagnosis not present

## 2021-11-08 LAB — BASIC METABOLIC PANEL
Anion gap: 10 (ref 5–15)
BUN: 20 mg/dL (ref 8–23)
CO2: 27 mmol/L (ref 22–32)
Calcium: 8.5 mg/dL — ABNORMAL LOW (ref 8.9–10.3)
Chloride: 96 mmol/L — ABNORMAL LOW (ref 98–111)
Creatinine, Ser: 1.55 mg/dL — ABNORMAL HIGH (ref 0.61–1.24)
GFR, Estimated: 48 mL/min — ABNORMAL LOW (ref >60)
Glucose, Bld: 139 mg/dL — ABNORMAL HIGH (ref 70–99)
Potassium: 4.2 mmol/L (ref 3.5–5.1)
Sodium: 133 mmol/L — ABNORMAL LOW (ref 135–145)

## 2021-11-08 LAB — CBC WITH DIFFERENTIAL/PLATELET
Abs Immature Granulocytes: 0.02 10*3/uL (ref 0.00–0.07)
Basophils Absolute: 0 10*3/uL (ref 0.0–0.1)
Basophils Relative: 0 %
Eosinophils Absolute: 0 10*3/uL (ref 0.0–0.5)
Eosinophils Relative: 1 %
HCT: 29.2 % — ABNORMAL LOW (ref 39.0–52.0)
Hemoglobin: 9.6 g/dL — ABNORMAL LOW (ref 13.0–17.0)
Immature Granulocytes: 1 %
Lymphocytes Relative: 11 %
Lymphs Abs: 0.5 10*3/uL — ABNORMAL LOW (ref 0.7–4.0)
MCH: 26.9 pg (ref 26.0–34.0)
MCHC: 32.9 g/dL (ref 30.0–36.0)
MCV: 81.8 fL (ref 80.0–100.0)
Monocytes Absolute: 0.5 10*3/uL (ref 0.1–1.0)
Monocytes Relative: 11 %
Neutro Abs: 3.3 10*3/uL (ref 1.7–7.7)
Neutrophils Relative %: 76 %
Platelets: 161 10*3/uL (ref 150–400)
RBC: 3.57 MIL/uL — ABNORMAL LOW (ref 4.22–5.81)
RDW: 15.1 % (ref 11.5–15.5)
WBC: 4.3 10*3/uL (ref 4.0–10.5)
nRBC: 0 % (ref 0.0–0.2)

## 2021-11-08 LAB — GLUCOSE, CAPILLARY
Glucose-Capillary: 146 mg/dL — ABNORMAL HIGH (ref 70–99)
Glucose-Capillary: 165 mg/dL — ABNORMAL HIGH (ref 70–99)
Glucose-Capillary: 174 mg/dL — ABNORMAL HIGH (ref 70–99)
Glucose-Capillary: 177 mg/dL — ABNORMAL HIGH (ref 70–99)

## 2021-11-08 LAB — ECHOCARDIOGRAM COMPLETE
AR max vel: 2.4 cm2
AV Area VTI: 2.13 cm2
AV Area mean vel: 2.34 cm2
AV Mean grad: 2 mmHg
AV Peak grad: 3 mmHg
Ao pk vel: 0.86 m/s
Calc EF: 48.4 %
Height: 69 in
S' Lateral: 3 cm
Single Plane A2C EF: 43.6 %
Single Plane A4C EF: 51.7 %
Weight: 2638.47 [oz_av]

## 2021-11-08 LAB — PROTIME-INR
INR: 2.5 — ABNORMAL HIGH (ref 0.8–1.2)
Prothrombin Time: 26.8 s — ABNORMAL HIGH (ref 11.4–15.2)

## 2021-11-08 MED ORDER — DILTIAZEM HCL 60 MG PO TABS
60.0000 mg | ORAL_TABLET | Freq: Four times a day (QID) | ORAL | Status: DC
  Administered 2021-11-08 – 2021-11-09 (×5): 60 mg via ORAL
  Filled 2021-11-08 (×5): qty 1

## 2021-11-08 MED ORDER — GUAIFENESIN 100 MG/5ML PO LIQD
15.0000 mL | Freq: Four times a day (QID) | ORAL | Status: DC
  Administered 2021-11-08 – 2021-11-09 (×5): 15 mL via ORAL
  Filled 2021-11-08 (×5): qty 20

## 2021-11-08 MED ORDER — WARFARIN SODIUM 5 MG PO TABS
5.0000 mg | ORAL_TABLET | Freq: Once | ORAL | Status: AC
  Administered 2021-11-08: 5 mg via ORAL
  Filled 2021-11-08: qty 1

## 2021-11-08 NOTE — Progress Notes (Addendum)
ANTICOAGULATION CONSULT NOTE - Follow Up Consult  Pharmacy Consult for Warfarin Indication: atrial fibrillation  Allergies  Allergen Reactions   Codeine Other (See Comments)    Unknown reaction, severe headach   Metformin And Related Diarrhea    Patient Measurements: Height: 5\' 9"  (175.3 cm) Weight: 74.8 kg (164 lb 14.5 oz) IBW/kg (Calculated) : 70.7  Vital Signs: Temp: 98.5 F (36.9 C) (09/20 0338) Temp Source: Oral (09/20 0338) BP: 106/79 (09/20 0854) Pulse Rate: 105 (09/20 0613)  Labs: Recent Labs    11/06/21 0724 11/07/21 0636 11/08/21 0645  HGB 10.0* 9.6* 9.6*  HCT 30.3* 29.5* 29.2*  PLT 144* 136* 161  LABPROT 30.9* 27.0* 26.8*  INR 3.0* 2.5* 2.5*  CREATININE 1.68* 1.52* 1.55*     Estimated Creatinine Clearance: 45.6 mL/min (A) (by C-G formula based on SCr of 1.55 mg/dL (H)).  Assessment: 66 yr old male continues on Warfarin as PTA for atrial fibrillation. INR supratherapeutic (4.0) on admit 11/03/21 and up to 6.3 on 9/16.  Warfarin doses have been held >> INR down to 3.0 on 9/18 and Warfarin resumed with 5 mg x 1, as further drop expected after holding x 3 days. INR 2.5  on 9/19 and 10 mg dose given > INR 2.5 again today, remains therapeutic.    S/p 5 days of Azithromycin for CAP, sometimes increases INR.    Platelet count low stable> now in normal range, no bleeding reported.  PTA warfarin regimen: 5 mg MWFSat and 10 mg TTSun  Goal of Therapy:  INR 2-3 Monitor platelets by anticoagulation protocol: Yes   Plan:  Warfarin 5 mg PO x 1 today. Daily PT/INR. Monitor for signs/symptoms of bleeding.  Arty Baumgartner, RPh 11/08/2021,9:17 AM

## 2021-11-08 NOTE — Progress Notes (Signed)
PROGRESS NOTE    Brian [name]  HCW:[id number] DOB: 28-Jan-1956 DOA: 11/03/2021 PCP: [name], MD    Brief Narrative:  Brian [name] is a 66 y.o. 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  In the ED patient was in atrial fibrillation with rapid ventricular response.  Chest x-ray showed some patchy airspace opacity in the left lower lung.  Patient was then admitted hospital for further evaluation and treatment.   During hospitalization, patient continues to have A-fib with RVR and has been on Cardizem drip.      Assessment and Plan: Principal Problem:   Atrial fibrillation ([place]) Active Problems:   Hypokalemia   Aspiration pneumonia ([place])   Dysphagia   Fall at home, initial encounter   Acute kidney injury superimposed on chronic kidney disease (HCC)   Nausea, vomiting, and diarrhea   Essential hypertension, benign   Diastolic dysfunction   Diabetes mellitus, type II ([place])   History of CVA with residual deficit   Malignant neoplasm of base of tongue (HCC)   Pancytopenia (HCC)   Atrial fibrillation with RVR On Coreg.  On Cardizem drip as well but will restart p.o. Cardizem from home.  Patient takes to 40 mg daily.  Started 60 every 6 hourly and gradually titrate the Cardizem drip off.  Continue Coumadin pharmacy to dose.  2D echocardiogram pending.  Possible E. coli bacteremia Blood culture 1 bottle showing E. coli.  Repeat blood culture negative so far in 3 days..  Continue Rocephin for total of 7 days.   Hypokalemia Replenished and improved.  Latest potassium of 4.2.   Suspected aspiration pneumonia dysphagia Possible CAP history of squamous cell carcinoma of the  tongue. CXR with left lower lobe opacity on chest x-ray.  Does have some cough.  No fever or leukocytosis at this time. Modified barium swallow with recommendations of a dysphagia 3 diet with thin liquids and meds with pure on 9/7.  Continue aspiration precautions.  Currently on Rocephin.  Status post azithromycin.   Falls at home, initial encounter CT scan of the head did not note any acute abnormalities. PT/OT recommended home health PT on discharge.  Acute kidney injury superimposed on chronic kidney disease stage IIIb Initial admission creatinine up to 2.26 with BUN 26. Baseline creatinine previously noted to be 1.73 back in July.  Improved at this time.  Creatinine today at 1.5.  Essential hypertension Continue Coreg 25 mg twice daily, has been restarted on Cardizem p.o. with plans for discontinuation of Cardizem drip.  On losartan 100 mg at home as well.     Chronic diastolic dysfunction Last EF noted to be 55-60% with grade 2 diastolic dysfunction by echocardiogram from 03/07/2021. Repeat ECHO pending.  Continue Strict I&Os and daily weights   Diabetes mellitus type 2 Last hemoglobin A1c 6.6 continue sliding scale insulin, Accu-Cheks, diabetic diet.  Patient is on Actos and Jardiance at home which is on hold at this time.   History of CVA with residual deficit Patient with prior history of stroke with residual left upper extremity weakness.  On Coumadin.   History of squamous cell carcinoma of base of the tongue Previously treated with 7 weeks of radiation and chemotherapy   mild anemia at this time.  History of  malignancy with past chemotherapy.  Pancytopenia has improved.    DVT prophylaxis:  warfarin (COUMADIN) tablet 5 mg   Code Status:     Code Status: Full Code  Disposition: Home likely 11/09/2021  Status is: Inpatient Remains inpatient appropriate because: Bacteremia, IV antibiotic, A-fib with RVR on Cardizem drip,   Family Communication: None at  bedside  Consultants:  None  Procedures:  None  Antimicrobials:  Rocephin IV  Anti-infectives (From admission, onward)    Start     Dose/Rate Route Frequency Ordered Stop   11/04/21 1000  cefTRIAXone (ROCEPHIN) 2 g in sodium chloride 0.9 % 100 mL IVPB        2 g 200 mL/hr over 30 Minutes Intravenous Every 24 hours 11/03/21 1150     11/03/21 1200  cefTRIAXone (ROCEPHIN) 1 g in sodium chloride 0.9 % 100 mL IVPB        1 g 200 mL/hr over 30 Minutes Intravenous  Once 11/03/21 1150 11/03/21 1445   11/03/21 1045  cefTRIAXone (ROCEPHIN) 1 g in sodium chloride 0.9 % 100 mL IVPB        1 g 200 mL/hr over 30 Minutes Intravenous  Once 11/03/21 1034 11/03/21 1210   11/03/21 1045  azithromycin (ZITHROMAX) 500 mg in sodium chloride 0.9 % 250 mL IVPB  Status:  Discontinued        500 mg 250 mL/hr over 60 Minutes Intravenous Every 24 hours 11/03/21 1034 11/07/21 1509      Subjective: Today, patient was seen and examined at bedside.  Still complains of mild cough without sputum production.  No chest pain increasing shortness of breath fever or chills.  Objective: Vitals:   11/08/21 0338 11/08/21 0400 11/08/21 0613 11/08/21 0854  BP: 132/82   106/79  Pulse:   (!) 105   Resp: 18     Temp: 98.5 F (36.9 C)     TempSrc: Oral     SpO2: 94%     Weight:  74.8 kg    Height:        Intake/Output Summary (Last 24 hours) at 11/08/2021 1032 Last data filed at 11/08/2021 0606 Gross per 24 hour  Intake 311.66 ml  Output 1275 ml  Net -963.34 ml   Filed Weights   11/06/21 0200 11/07/21 0500 11/08/21 0400  Weight: 75.6 kg 75.3 kg 74.8 kg    Physical Examination: Body mass index is 24.35 kg/m.  General:  Average built, not in obvious distress HENT:   No scleral pallor or icterus noted. Oral mucosa is moist.  Chest:   Diminished breath sounds bilaterally.  Coarse breath sounds noted. CVS: S1 &S2 heard.  Irregularly irregular rhythm  Abdomen: Soft, nontender, nondistended.  Bowel sounds are  heard.   Extremities: No cyanosis, clubbing or edema.  Peripheral pulses are palpable. Psych: Alert, awake and oriented, normal mood CNS:  No cranial nerve deficits.  Power equal in all extremities.   Skin: Warm and dry.  No rashes noted.  Data Reviewed:   CBC: Recent Labs  Lab 11/03/21 0821 11/03/21 0849 11/04/21 0439 11/05/21 0813 11/06/21 0724 11/07/21 0636 11/08/21 0645  WBC 7.9  --  5.6 4.7 3.7* 4.0 4.3  NEUTROABS 6.8  --   --  3.8 3.0 3.1 3.3  HGB 11.3*   < > 9.8* 10.1* 10.0* 9.6* 9.6*  HCT 35.8*   < > 29.0* 31.0* 30.3* 29.5* 29.2*  MCV 83.8  --  81.2 81.8 80.6 81.3 81.8  PLT 147*  --  106*  131* 144* 136* 161   < > = values in this interval not displayed.    Basic Metabolic Panel: Recent Labs  Lab 11/03/21 0821 11/03/21 0849 11/04/21 0439 11/04/21 0750 11/05/21 0813 11/06/21 0724 11/07/21 0636 11/08/21 0645  NA 136   < > 135  --  132* 134* 134* 133*  K 2.6*   < > 3.2*  --  3.2* 3.3* 4.0 4.2  CL 95*   < > 97*  --  99 96* 99 96*  CO2 23  --  24  --  25 26 26 27   GLUCOSE 150*   < > 140*  --  153* 147* 140* 139*  BUN 26*   < > 29*  --  22 17 16 20   CREATININE 2.26*   < > 1.89*  --  1.66* 1.68* 1.52* 1.55*  CALCIUM 9.1  --  8.7*  --  8.1* 8.3* 8.3* 8.5*  MG 1.7  --   --  2.2  --   --  1.7  --    < > = values in this interval not displayed.    Liver Function Tests: Recent Labs  Lab 11/03/21 0821  AST 16  ALT 12  ALKPHOS 65  BILITOT 0.8  PROT 6.5  ALBUMIN 3.3*     Radiology Studies: No results found.    LOS: 5 days    Flora Lipps, MD [facility] Available via Epic secure chat 7am-7pm After these hours, please refer to coverage provider listed on [email or web address] 11/08/2021, 10:32 AM

## 2021-11-08 NOTE — TOC Progression Note (Signed)
Transition of Care [facility]) - Progression Note    Patient Details  Name: Brian [name] MRN: [id number] Date of Birth: [birth year]  Transition of Care [facility]) CM/SW [place], RN Phone Number:[phone]  11/08/2021, 9:42 AM  Clinical Narrative:    CM at bedside to offer choice for home health services. Patient states that he does not have a preference for choice. Home health referral has been submitted to [facility]. Acceptance pending        Expected Discharge Plan and Services                                                 Social Determinants of Health (SDOH) Interventions    Readmission Risk Interventions     No data to display

## 2021-11-08 NOTE — Care Management Important Message (Signed)
Important Message  Patient Details  Name: Brian [name] MRN: [id number] Date of Birth: 1955/05/12   Medicare Important Message Given:  Yes     Shelda Altes 11/08/2021, 2:46 PM

## 2021-11-08 NOTE — Progress Notes (Signed)
Mobility Specialist Progress Note    11/08/21 1600  Mobility  Activity Ambulated with assistance in hallway  Level of Assistance Contact guard assist, steadying assist  Assistive Device Other (Comment) (HHA)  Distance Ambulated (ft) 280 ft  Activity Response Tolerated well  $Mobility charge 1 Mobility   Pre-Mobility: 100 HR During Mobility: 116 HR Post-Mobility: 109 HR  Pt received in chair and agreeable. No complaints on walk. Returned to bed with call bell in reach.    Hildred Alamin Mobility Specialist

## 2021-11-08 NOTE — Progress Notes (Signed)
  Echocardiogram 2D Echocardiogram has been performed.  Brian [name] 11/08/2021, 10:30 AM

## 2021-11-08 NOTE — Plan of Care (Signed)

## 2021-11-09 DIAGNOSIS — I480 Paroxysmal atrial fibrillation: Secondary | ICD-10-CM | POA: Diagnosis not present

## 2021-11-09 DIAGNOSIS — J690 Pneumonitis due to inhalation of food and vomit: Secondary | ICD-10-CM | POA: Diagnosis not present

## 2021-11-09 DIAGNOSIS — E1169 Type 2 diabetes mellitus with other specified complication: Secondary | ICD-10-CM | POA: Diagnosis not present

## 2021-11-09 DIAGNOSIS — N179 Acute kidney failure, unspecified: Secondary | ICD-10-CM | POA: Diagnosis not present

## 2021-11-09 LAB — BASIC METABOLIC PANEL
Anion gap: 12 (ref 5–15)
BUN: 22 mg/dL (ref 8–23)
CO2: 26 mmol/L (ref 22–32)
Calcium: 8.7 mg/dL — ABNORMAL LOW (ref 8.9–10.3)
Chloride: 95 mmol/L — ABNORMAL LOW (ref 98–111)
Creatinine, Ser: 1.87 mg/dL — ABNORMAL HIGH (ref 0.61–1.24)
GFR, Estimated: 39 mL/min — ABNORMAL LOW (ref >60)
Glucose, Bld: 183 mg/dL — ABNORMAL HIGH (ref 70–99)
Potassium: 4.3 mmol/L (ref 3.5–5.1)
Sodium: 133 mmol/L — ABNORMAL LOW (ref 135–145)

## 2021-11-09 LAB — CBC WITH DIFFERENTIAL/PLATELET
Abs Immature Granulocytes: 0.03 10*3/uL (ref 0.00–0.07)
Basophils Absolute: 0 10*3/uL (ref 0.0–0.1)
Basophils Relative: 0 %
Eosinophils Absolute: 0 10*3/uL (ref 0.0–0.5)
Eosinophils Relative: 0 %
HCT: 33.1 % — ABNORMAL LOW (ref 39.0–52.0)
Hemoglobin: 10.8 g/dL — ABNORMAL LOW (ref 13.0–17.0)
Immature Granulocytes: 1 %
Lymphocytes Relative: 9 %
Lymphs Abs: 0.4 10*3/uL — ABNORMAL LOW (ref 0.7–4.0)
MCH: 26.9 pg (ref 26.0–34.0)
MCHC: 32.6 g/dL (ref 30.0–36.0)
MCV: 82.5 fL (ref 80.0–100.0)
Monocytes Absolute: 0.3 10*3/uL (ref 0.1–1.0)
Monocytes Relative: 7 %
Neutro Abs: 3.8 10*3/uL (ref 1.7–7.7)
Neutrophils Relative %: 83 %
Platelets: 204 10*3/uL (ref 150–400)
RBC: 4.01 MIL/uL — ABNORMAL LOW (ref 4.22–5.81)
RDW: 15.1 % (ref 11.5–15.5)
WBC: 4.6 10*3/uL (ref 4.0–10.5)
nRBC: 0 % (ref 0.0–0.2)

## 2021-11-09 LAB — GLUCOSE, CAPILLARY: Glucose-Capillary: 161 mg/dL — ABNORMAL HIGH (ref 70–99)

## 2021-11-09 LAB — PROTIME-INR
INR: 2.7 — ABNORMAL HIGH (ref 0.8–1.2)
Prothrombin Time: 28.4 seconds — ABNORMAL HIGH (ref 11.4–15.2)

## 2021-11-09 MED ORDER — CEFDINIR 300 MG PO CAPS: 300.0000 mg | ORAL_CAPSULE | Freq: Two times a day (BID) | ORAL | 0 refills | Status: DC

## 2021-11-09 MED ORDER — WARFARIN SODIUM 10 MG PO TABS: 5.0000 mg | ORAL_TABLET | Freq: Every day | ORAL | Status: DC

## 2021-11-09 MED ORDER — CARVEDILOL 25 MG PO TABS: 25.0000 mg | ORAL_TABLET | Freq: Two times a day (BID) | ORAL | 2 refills | Status: DC

## 2021-11-09 NOTE — Progress Notes (Signed)
Physical Therapy Treatment Patient Details Name: Brian [name] MRN: [id number] DOB: 1955/07/02 Today's Date: 11/09/2021   History of Present Illness 66 yo male admitted 9/15 with weakness, diarrhea and falls in Afib. PMhx: CVA, HTN, HLD, CKD, DM, tongue CA.    PT Comments    Pt received seated EOB and agreeable to session with focus on gait training with trial of SPC for increased safety and activity tolerance. Pt demonstrating increased stability with AD and min guard for safety. Pt able to accept mild balance challenges during gait with pt able to self correct in all instances with good stepping strategies. Educated and discussed safety with mobility, activity recommendations and importance of continued mobility with pt verbalizing understanding. Pt continues to benefit from skilled PT services to progress toward functional mobility goals.    Recommendations for follow up therapy are one component of a multi-disciplinary discharge planning process, led by the attending physician.  Recommendations may be updated based on patient status, additional functional criteria and insurance authorization.  Follow Up Recommendations  Home health PT     Assistance Recommended at Discharge PRN  Patient can return home with the following Assistance with cooking/housework;A little help with bathing/dressing/bathroom;A little help with walking and/or transfers   Equipment Recommendations  Cane    Recommendations for Other Services       Precautions / Restrictions Precautions Precautions: None Restrictions Weight Bearing Restrictions: No     Mobility  Bed Mobility Overal bed mobility: Modified Independent             General bed mobility comments: pt seated EOB pre and post session    Transfers Overall transfer level: Needs assistance Equipment used: None Transfers: Sit to/from Stand, Bed to chair/wheelchair/BSC Sit to Stand: Supervision           General transfer comment:  guarding for safety to stand from bed    Ambulation/Gait Ambulation/Gait assistance: Min guard Gait Distance (Feet): 260 Feet Assistive device: Straight cane Gait Pattern/deviations: Step-through pattern, Decreased stride length, Decreased stance time - left Gait velocity: decr     General Gait Details: pt with slightly hemiparetic gait with decreased arm swing left and decreased stance left. pt witthout LOB with SPC, increased gait speed, able to accept mild balance challenges with head turns with pt able to self correct in all instances with good stepping strategies   Stairs         General stair comments: pt declining stair negotiation this date   Wheelchair Mobility    Modified Rankin (Stroke Patients Only)       Balance Overall balance assessment: Mild deficits observed, not formally tested   Sitting balance-Leahy Scale: Fair Sitting balance - Comments: pt unable to don/doff socks EOB     Standing balance-Leahy Scale: Good Standing balance comment: static standing with guarding and gait                            Cognition Arousal/Alertness: Awake/alert Behavior During Therapy: WFL for tasks assessed/performed Overall Cognitive Status: Within Functional Limits for tasks assessed Area of Impairment: Safety/judgement                         Safety/Judgement: Decreased awareness of deficits              Exercises      General Comments        Pertinent Vitals/Pain Pain Assessment Faces Pain Scale:  No hurt    Home Living Family/patient expects to be discharged to:: Private residence Living Arrangements: Parent                      Prior Function            PT Goals (current goals can now be found in the care plan section) Acute Rehab PT Goals Patient Stated Goal: return home PT Goal Formulation: With patient Time For Goal Achievement: 11/18/21    Frequency    Min 3X/week      PT Plan       Co-evaluation              AM-PAC PT "6 Clicks" Mobility   Outcome Measure  Help needed turning from your back to your side while in a flat bed without using bedrails?: A Little Help needed moving from lying on your back to sitting on the side of a flat bed without using bedrails?: A Little Help needed moving to and from a bed to a chair (including a wheelchair)?: A Little Help needed standing up from a chair using your arms (e.g., wheelchair or bedside chair)?: A Little Help needed to walk in hospital room?: A Little Help needed climbing 3-5 steps with a railing? : A Lot 6 Click Score: 17    End of Session   Activity Tolerance: Patient tolerated treatment well Patient left: with call bell/phone within reach;in bed (seated EOB) Nurse Communication: Mobility status PT Visit Diagnosis: Other abnormalities of gait and mobility (R26.89);Difficulty in walking, not elsewhere classified (R26.2);History of falling (Z91.81)     Time: 7169-6789 PT Time Calculation (min) (ACUTE ONLY): 14 min  Charges:  $Gait Training: 8-22 mins                     [name] R. PTA Acute Rehabilitation Services Office: [place] 11/09/2021, 12:06 PM

## 2021-11-09 NOTE — Discharge Instructions (Signed)
Information on my medicine - Coumadin   (Warfarin)  You were taking this medication prior to this hospital admission.    Why was Coumadin prescribed for you? Coumadin was prescribed for you because you have a blood clot or a medical condition that can cause an increased risk of forming blood clots. Blood clots can cause serious health problems by blocking the flow of blood to the heart, lung, or brain. Coumadin can prevent harmful blood clots from forming. As a reminder your indication for Coumadin is:  Stroke Prevention because of Atrial Fibrillation  What test will check on my response to Coumadin? While on Coumadin (warfarin) you will need to have an INR test regularly to ensure that your dose is keeping you in the desired range. The INR (international normalized ratio) number is calculated from the result of the laboratory test called prothrombin time (PT).  If an INR APPOINTMENT HAS NOT ALREADY BEEN MADE FOR YOU please schedule an appointment to have this lab work done by your health care provider within 7 days. Your INR goal is usually a number between:  2 to 3 or your provider may give you a more narrow range like 2-2.5.  Ask your health care provider during an office visit what your goal INR is.  What  do you need to  know  About  COUMADIN? Take Coumadin (warfarin) exactly as prescribed by your healthcare provider about the same time each day.  DO NOT stop taking without talking to the doctor who prescribed the medication.  Stopping without other blood clot prevention medication to take the place of Coumadin may increase your risk of developing a new clot or stroke.  Get refills before you run out.  What do you do if you miss a dose? If you miss a dose, take it as soon as you remember on the same day then continue your regularly scheduled regimen the next day.  Do not take two doses of Coumadin at the same time.  Important Safety Information A possible side effect of Coumadin  (Warfarin) is an increased risk of bleeding. You should call your healthcare provider right away if you experience any of the following: Bleeding from an injury or your nose that does not stop. Unusual colored urine (red or dark brown) or unusual colored stools (red or black). Unusual bruising for unknown reasons. A serious fall or if you hit your head (even if there is no bleeding).  Some foods or medicines interact with Coumadin (warfarin) and might alter your response to warfarin. To help avoid this: Eat a balanced diet, maintaining a consistent amount of Vitamin K. Notify your provider about major diet changes you plan to make. Avoid alcohol or limit your intake to 1 drink for women and 2 drinks for men per day. (1 drink is 5 oz. wine, 12 oz. beer, or 1.5 oz. liquor.)  Make sure that ANY health care provider who prescribes medication for you knows that you are taking Coumadin (warfarin).  Also make sure the healthcare provider who is monitoring your Coumadin knows when you have started a new medication including herbals and non-prescription products.  Coumadin (Warfarin)  Major Drug Interactions  Increased Warfarin Effect Decreased Warfarin Effect  Alcohol (large quantities) Antibiotics (esp. Septra/Bactrim, Flagyl, Cipro) Amiodarone (Cordarone) Aspirin (ASA) Cimetidine (Tagamet) Megestrol (Megace) NSAIDs (ibuprofen, naproxen, etc.) Piroxicam (Feldene) Propafenone (Rythmol SR) Propranolol (Inderal) Isoniazid (INH) Posaconazole (Noxafil) Barbiturates (Phenobarbital) Carbamazepine (Tegretol) Chlordiazepoxide (Librium) Cholestyramine (Questran) Griseofulvin Oral Contraceptives Rifampin Sucralfate (Carafate) Vitamin K  Coumadin (Warfarin) Major Herbal Interactions  Increased Warfarin Effect Decreased Warfarin Effect  Garlic Ginseng Ginkgo biloba Coenzyme Q10 Green tea St. John's wort    Coumadin (Warfarin) FOOD Interactions  Eat a consistent number of servings per  week of foods HIGH in Vitamin K (1 serving =  cup)  Collards (cooked, or boiled & drained) Kale (cooked, or boiled & drained) Mustard greens (cooked, or boiled & drained) Parsley *serving size only =  cup Spinach (cooked, or boiled & drained) Swiss chard (cooked, or boiled & drained) Turnip greens (cooked, or boiled & drained)  Eat a consistent number of servings per week of foods MEDIUM-HIGH in Vitamin K (1 serving = 1 cup)  Asparagus (cooked, or boiled & drained) Broccoli (cooked, boiled & drained, or raw & chopped) Brussel sprouts (cooked, or boiled & drained) *serving size only =  cup Lettuce, raw (green leaf, endive, romaine) Spinach, raw Turnip greens, raw & chopped   These websites have more information on Coumadin (warfarin):  FailFactory.se; VeganReport.com.au;

## 2021-11-09 NOTE — Discharge Summary (Addendum)
Physician Discharge Summary  [name] VZS:[id number] DOB: 17-Mar-1955 DOA: 11/03/2021  PCP: [name], MD  Admit date: 11/03/2021 Discharge date: 11/09/2021  Admitted From: Home  Discharge disposition: Home with home health   Recommendations for Outpatient Follow-Up:   Follow up with your primary care provider in one week.  Check CBC, BMP, magnesium in the next visit Follow-up with your cardiologist as scheduled by the clinic.   Discharge Diagnosis:   Principal Problem:   Atrial fibrillation (HCC) Active Problems:   Aspiration pneumonia ([place])   Dysphagia   Fall at home, initial encounter   Nausea, vomiting, and diarrhea   Essential hypertension, benign   Diastolic dysfunction   Diabetes mellitus, type II ([place])   History of CVA with residual deficit   Malignant neoplasm of base of tongue (HCC)   Pancytopenia (HCC)  Resolved:   Hypokalemia   Acute kidney injury superimposed on chronic kidney disease ([place])  Discharge Condition: Improved.  Diet recommendation: Low sodium, heart healthy.  Carbohydrate-modified.  Mechanical soft diet.  Wound care: None.  Code status: Full.   History of Present Illness:   OVILA [name] is a 66 y.Brian. 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  In the ED, patient was in atrial fibrillation with rapid ventricular response.  Chest x-ray showed some patchy airspace opacity in the left lower lung.  Patient was then admitted hospital for further evaluation and treatment.   Hospital Course:   Following conditions were addressed during hospitalization as listed below,  Permanent Atrial fibrillation with RVR Multifactorial secondary to  infection and bacteremia electrolyte imbalance.  Improved at this time.  Was initially on Cardizem drip.  Has been transitioned back to Coreg and Cardizem from home.  On Coumadin for anticoagulation.  INR therapeutic at this time.  2D echocardiogram with LV ejection fraction 55 to 60% with no regional wall motion abnormality.  Possible E. coli bacteremia Blood culture 1 bottle showing E. coli.  Repeat blood culture negative so far in 4 days..  Received Rocephin during hospitalization.  Has completed 7-day course of Rocephin.  Hypokalemia Replenished and improved.  Latest potassium of 4.3.   Suspected aspiration pneumonia dysphagia Possible CAP history of squamous cell carcinoma of the tongue. CXR with left lower lobe opacity on chest x-ray.  Does have some cough.  No fever or leukocytosis at this time.Modified barium swallow with recommendations of a dysphagia 3 diet with thin liquids and meds.  Will recommend soft diet on discharge.  Continue aspiration precautions.  Has completed 7-day course of Rocephin.  Falls at home, initial encounter CT scan of the head did not note any acute abnormalities. PT/OT recommended home health PT on discharge.   Acute kidney injury superimposed on chronic kidney disease stage IIIb Initial admission creatinine up to 2.26 with BUN 26. Baseline creatinine previously noted to be 1.73 back in July.  Improved at this time.  Creatinine today at 1.8.   Essential hypertension Continue Coreg 25 mg twice daily, has been restarted on Cardizem p.Brian.  On losartan 100 mg at home as well.     Chronic diastolic dysfunction Last EF noted to be 55-60% with grade 2 diastolic dysfunction by echocardiogram from 03/07/2021. Repeat ECHO pending.  Continue Strict I&Os and  daily weights   Diabetes mellitus type 2 Last hemoglobin A1c 6.6.  Patient is on Actos and Jardiance    History of CVA with residual deficit Patient with prior history of stroke with residual left upper extremity  weakness.  On Coumadin.  We will continue physical therapy after discharge   History of squamous cell carcinoma of base of the tongue Previously treated with 7 weeks of radiation and chemotherapy   Mild anemia at this time.  History of malignancy with past chemotherapy.  Pancytopenia has improved.   Disposition.  At this time, patient is stable for disposition home with outpatient PCP and cardiology follow-up.  Medical Consultants:   None.  Procedures:    None Subjective:   Today, patient seen and examined at bedside.  Inquiring about going home.  Feels better.  Denies any shortness of breath, cough chest pain fever chills.    Discharge Exam:   Vitals:   11/09/21 0306 11/09/21 0756  BP: (!) 132/99 102/75  Pulse: 100 100  Resp: 18 19  Temp: 98.2 F (36.8 C) 97.8 F (36.6 C)  SpO2: 97% 97%   Vitals:   11/08/21 2331 11/09/21 0306 11/09/21 0442 11/09/21 0756  BP: (!) 139/106 (!) 132/99  102/75  Pulse: (!) 101 100  100  Resp: _0 Temp: 98.4 F (36.9 C) 98.2 F (36.8 C)  97.8 F (36.6 C)  TempSrc: Oral Oral  Oral  SpO2: 95% 97%  97%  Weight:   73.4 kg   Height:       Body mass index is 23.9 kg/m.   General: Alert awake, not in obvious distress on room air HENT: pupils equally reacting to light,  No scleral pallor or icterus noted. Oral mucosa is moist.  Chest:    Diminished breath sounds bilaterally.  CVS: S1 &S2 heard. No murmur.  Regular rate and rhythm. Abdomen: Soft, nontender, nondistended.  Bowel sounds are heard.   Extremities: No cyanosis, clubbing or edema.  Peripheral pulses are palpable. Psych: Alert, awake and oriented, normal mood CNS:  No cranial nerve deficits.  Power equal in all extremities.   Skin: Warm and dry.  No rashes noted.  The results of significant diagnostics from this hospitalization (including imaging, microbiology, ancillary and laboratory) are listed below for reference.     Diagnostic Studies:   CT Head Wo  Contrast  Result Date: 11/03/2021 CLINICAL DATA:  Head trauma, minor (Age >= 65y) EXAM: CT HEAD WITHOUT CONTRAST TECHNIQUE: Contiguous axial images were obtained from the base of the skull through the vertex without intravenous contrast. RADIATION DOSE REDUCTION: This exam was performed according to the departmental dose-optimization program which includes automated exposure control, adjustment of the mA and/or kV according to patient size and/or use of iterative reconstruction technique. COMPARISON:  Head CT 01/15/2013 FINDINGS: Brain: No evidence of acute intracranial hemorrhage or abnormal extra-axial collection.There is encephalomalacia in the right parietal into a lesser degree the left parietal lobe consistent with chronic infarct. Chronic left caudate infarct.Mild ex vacuo dilation of the lateral ventricles.Confluent white matter hypoattenuation compatible with chronic small-vessel ischemic disease. Vascular: Vascular calcifications.  No hyperdense vessel. Skull: Negative for skull fracture. Sinuses/Orbits: Mild paranasal sinus mucosal thickening. Right mastoid effusion. Other: None. IMPRESSION: No acute intracranial abnormality. Chronic parietal lobe infarcts, right greater than left. Chronic left caudate infarct. Advanced chronic small vessel ischemic disease. Electronically Signed   By: Maurine Simmering M.D.   On: 11/03/2021 09:24   DG Chest 2 View  Result Date: 11/03/2021 CLINICAL DATA:  cough EXAM: CHEST - 2 VIEW COMPARISON:  May 02, 2020, October 02, 2021 FINDINGS: The cardiomediastinal silhouette is unchanged in contour. No pleural effusion. No pneumothorax. LEFT lower lobe airspace opacity, nonspecific. Visualized abdomen is unremarkable. Minimal degenerative changes of the thoracic spine. IMPRESSION: LEFT lower lobe patchy airspace opacity. Differential considerations include infection, aspiration or atelectasis. Electronically Signed   By: Valentino Saxon M.D.   On: 11/03/2021 09:14      Labs:   Basic Metabolic Panel: Recent Labs  Lab 11/03/21 0821 11/03/21 0849 11/04/21 0750 11/05/21 0813 11/06/21 0724 11/07/21 0636 11/08/21 0645 11/09/21 0726  NA 136   < >  --  132* 134* 134* 133* 133*  K 2.6*   < >  --  3.2* 3.3* 4.0 4.2 4.3  CL 95*   < >  --  99 96* 99 96* 95*  CO2 23   < >  --  _0 GLUCOSE 150*   < >  --  153* 147* 140* 139* 183*  BUN 26*   < >  --  _1 CREATININE 2.26*   < >  --  1.66* 1.68* 1.52* 1.55* 1.87*  CALCIUM 9.1   < >  --  8.1* 8.3* 8.3* 8.5* 8.7*  MG 1.7  --  2.2  --   --  1.7  --   --    < > = values in this interval not displayed.   GFR Estimated Creatinine Clearance: 37.8 mL/min (A) (by C-G formula based on SCr of 1.87 mg/dL (H)). Liver Function Tests: Recent Labs  Lab 11/03/21 0821  AST 16  ALT 12  ALKPHOS 65  BILITOT 0.8  PROT 6.5  ALBUMIN 3.3*   No results for input(s): "LIPASE", "AMYLASE" in the last 168 hours. No results for input(s): "AMMONIA" in the last 168 hours. Coagulation profile Recent Labs  Lab 11/05/21 0813 11/06/21 0724 11/07/21 0636 11/08/21 0645 11/09/21 0726  INR 4.5* 3.0* 2.5* 2.5* 2.7*    CBC: Recent Labs  Lab 11/05/21 0813 11/06/21 0724 11/07/21 0636 11/08/21 0645 11/09/21 0726  WBC 4.7 3.7* 4.0 4.3 4.6  NEUTROABS 3.8 3.0 3.1 3.3 3.8  HGB 10.1* 10.0* 9.6* 9.6* 10.8*  HCT 31.0* 30.3* 29.5* 29.2* 33.1*  MCV 81.8 80.6 81.3 81.8 82.5  PLT 131* 144* 136* 161 204   Cardiac Enzymes: Recent Labs  Lab 11/03/21 1300  CKTOTAL 104   BNP: Invalid input(s): "POCBNP" CBG: Recent Labs  Lab 11/08/21 0602 11/08/21 1144 11/08/21 1635 11/08/21 2122 11/09/21 0608  GLUCAP 146* 165* 174* 177* 161*   D-Dimer No results for input(s): "DDIMER" in the last 72 hours. Hgb A1c No results for input(s): "HGBA1C" in the last 72 hours. Lipid Profile No results for input(s): "CHOL", "HDL", "LDLCALC", "TRIG", "CHOLHDL", "LDLDIRECT" in the last 72 hours. Thyroid function  studies No results for input(s): "TSH", "T4TOTAL", "T3FREE", "THYROIDAB" in the last 72 hours.  Invalid input(s): "FREET3" Anemia work up No results for input(s): "VITAMINB12", "FOLATE", "FERRITIN", "TIBC", "IRON", "RETICCTPCT" in the last 72 hours. Microbiology Recent Results (from the past 240 hour(s))  Resp Panel by RT-PCR (Flu A&B, Covid) Anterior Nasal Swab     Status: None   Collection Time: 11/03/21  8:29 AM   Specimen: Anterior Nasal Swab  Result Value Ref Range Status   SARS Coronavirus 2 by RT PCR NEGATIVE NEGATIVE Final    Comment: (NOTE) SARS-CoV-2 target nucleic acids are  NOT DETECTED.  The SARS-CoV-2 RNA is generally detectable in upper respiratory specimens during the acute phase of infection. The lowest concentration of SARS-CoV-2 viral copies this assay can detect is 138 copies/mL. A negative result does not preclude SARS-Cov-2 infection and should not be used as the sole basis for treatment or other patient management decisions. A negative result may occur with  improper specimen collection/handling, submission of specimen other than nasopharyngeal swab, presence of viral mutation(s) within the areas targeted by this assay, and inadequate number of viral copies(<138 copies/mL). A negative result must be combined with clinical observations, patient history, and epidemiological information. The expected result is Negative.  Fact Sheet for Patients:  EntrepreneurPulse.com.au  Fact Sheet for Healthcare Providers:  IncredibleEmployment.be  This test is no t yet approved or cleared by the [place] FDA and  has been authorized for detection and/or diagnosis of SARS-CoV-2 by FDA under an Emergency Use Authorization (EUA). This EUA will remain  in effect (meaning this test can be used) for the duration of the COVID-19 declaration under Section 564(b)(1) of the Act, 21 U.S.C.section 360bbb-3(b)(1), unless the authorization is  terminated  or revoked sooner.       Influenza A by PCR NEGATIVE NEGATIVE Final   Influenza B by PCR NEGATIVE NEGATIVE Final    Comment: (NOTE) The Xpert Xpress SARS-CoV-2/FLU/RSV plus assay is intended as an aid in the diagnosis of influenza from Nasopharyngeal swab specimens and should not be used as a sole basis for treatment. Nasal washings and aspirates are unacceptable for Xpert Xpress SARS-CoV-2/FLU/RSV testing.  Fact Sheet for Patients: EntrepreneurPulse.com.au  Fact Sheet for Healthcare Providers: IncredibleEmployment.be  This test is not yet approved or cleared by the [place] FDA and has been authorized for detection and/or diagnosis of SARS-CoV-2 by FDA under an Emergency Use Authorization (EUA). This EUA will remain in effect (meaning this test can be used) for the duration of the COVID-19 declaration under Section 564(b)(1) of the Act, 21 U.S.C. section 360bbb-3(b)(1), unless the authorization is terminated or revoked.  Performed at [facility], [place] [address], [place], [place] [address]   Culture, blood (single) w Reflex to ID Panel     Status: Abnormal   Collection Time: 11/03/21 10:42 AM   Specimen: BLOOD RIGHT HAND  Result Value Ref Range Status   Specimen Description BLOOD RIGHT HAND  Final   Special Requests   Final    BOTTLES DRAWN AEROBIC AND ANAEROBIC Blood Culture adequate volume   Culture  Setup Time   Final    GRAM NEGATIVE RODS ANAEROBIC BOTTLE ONLY Organism ID to follow CRITICAL RESULT CALLED TO, READ BACK BY AND VERIFIED WITH: T RUDISILL,[email or web address]_[email or web address]  11/04/21 [place] Performed at [facility], [place] [address], [place], [place] [address]    Culture ESCHERICHIA COLI (A)  Final   Report Status 11/06/2021 FINAL  Final   Organism ID, Bacteria ESCHERICHIA COLI  Final      Susceptibility   Escherichia coli - MIC*    AMPICILLIN <=2 SENSITIVE Sensitive     CEFAZOLIN <=4 SENSITIVE Sensitive      CEFEPIME <=0.12 SENSITIVE Sensitive     CEFTAZIDIME <=1 SENSITIVE Sensitive     CEFTRIAXONE <=0.25 SENSITIVE Sensitive     CIPROFLOXACIN <=0.25 SENSITIVE Sensitive     GENTAMICIN <=1 SENSITIVE Sensitive     IMIPENEM <=0.25 SENSITIVE Sensitive     TRIMETH/SULFA <=20 SENSITIVE Sensitive     AMPICILLIN/SULBACTAM <=2 SENSITIVE Sensitive     PIP/TAZO <=4 SENSITIVE  Sensitive     * ESCHERICHIA COLI  Blood Culture ID Panel (Reflexed)     Status: Abnormal   Collection Time: 11/03/21 10:42 AM  Result Value Ref Range Status   Enterococcus faecalis NOT DETECTED NOT DETECTED Final   Enterococcus Faecium NOT DETECTED NOT DETECTED Final   Listeria monocytogenes NOT DETECTED NOT DETECTED Final   Staphylococcus species NOT DETECTED NOT DETECTED Final   Staphylococcus aureus (BCID) NOT DETECTED NOT DETECTED Final   Staphylococcus epidermidis NOT DETECTED NOT DETECTED Final   Staphylococcus lugdunensis NOT DETECTED NOT DETECTED Final   Streptococcus species NOT DETECTED NOT DETECTED Final   Streptococcus agalactiae NOT DETECTED NOT DETECTED Final   Streptococcus pneumoniae NOT DETECTED NOT DETECTED Final   Streptococcus pyogenes NOT DETECTED NOT DETECTED Final   A.calcoaceticus-baumannii NOT DETECTED NOT DETECTED Final   Bacteroides fragilis NOT DETECTED NOT DETECTED Final   Enterobacterales DETECTED (A) NOT DETECTED Final    Comment: Enterobacterales represent a large order of gram negative bacteria, not a single organism. CRITICAL RESULT CALLED TO, READ BACK BY AND VERIFIED WITH: T RUDISILL,[email or web address]_[email or web address]  11/04/21 [place]    Enterobacter cloacae complex NOT DETECTED NOT DETECTED Final   Escherichia coli DETECTED (A) NOT DETECTED Final    Comment: CRITICAL RESULT CALLED TO, READ BACK BY AND VERIFIED WITH: T RUDISILL,[email or web address]_1  11/04/21 [place]    Klebsiella aerogenes NOT DETECTED NOT DETECTED Final   Klebsiella oxytoca NOT DETECTED NOT DETECTED Final   Klebsiella pneumoniae NOT DETECTED NOT DETECTED Final    Proteus species NOT DETECTED NOT DETECTED Final   Salmonella species NOT DETECTED NOT DETECTED Final   Serratia marcescens NOT DETECTED NOT DETECTED Final   Haemophilus influenzae NOT DETECTED NOT DETECTED Final   Neisseria meningitidis NOT DETECTED NOT DETECTED Final   Pseudomonas aeruginosa NOT DETECTED NOT DETECTED Final   Stenotrophomonas maltophilia NOT DETECTED NOT DETECTED Final   Candida albicans NOT DETECTED NOT DETECTED Final   Candida auris NOT DETECTED NOT DETECTED Final   Candida glabrata NOT DETECTED NOT DETECTED Final   Candida krusei NOT DETECTED NOT DETECTED Final   Candida parapsilosis NOT DETECTED NOT DETECTED Final   Candida tropicalis NOT DETECTED NOT DETECTED Final   Cryptococcus neoformans/gattii NOT DETECTED NOT DETECTED Final   CTX-M ESBL NOT DETECTED NOT DETECTED Final   Carbapenem resistance IMP NOT DETECTED NOT DETECTED Final   Carbapenem resistance KPC NOT DETECTED NOT DETECTED Final   Carbapenem resistance NDM NOT DETECTED NOT DETECTED Final   Carbapenem resist OXA 48 LIKE NOT DETECTED NOT DETECTED Final   Carbapenem resistance VIM NOT DETECTED NOT DETECTED Final    Comment: Performed at [facility], [address] [place], [place] [address]  MRSA Next Gen by PCR, Nasal     Status: None   Collection Time: 11/03/21  3:28 PM   Specimen: Nasal Mucosa; Nasal Swab  Result Value Ref Range Status   MRSA by PCR Next Gen NOT DETECTED NOT DETECTED Final    Comment: (NOTE) The GeneXpert MRSA Assay (FDA approved for NASAL specimens only), is one component of a comprehensive MRSA colonization surveillance program. It is not intended to diagnose MRSA infection nor to guide or monitor treatment for MRSA infections. Test performance is not FDA approved in patients less than 18 years old. Performed at [facility], [place] [address] [place], [place] [address]   Culture, blood (Routine X 2) w Reflex to ID Panel     Status: None (Preliminary result)    Collection Time: 11/05/21  8:13 AM   Specimen: BLOOD  Result Value Ref Range Status   Specimen Description BLOOD SITE NOT SPECIFIED  Final   Special Requests   Final    BOTTLES DRAWN AEROBIC ONLY Blood Culture results may not be optimal due to an inadequate volume of blood received in culture bottles   Culture   Final    NO GROWTH 4 DAYS Performed at [facility], [place] [address] [place], [place] [address]    Report Status PENDING  Incomplete  Culture, blood (Routine X 2) w Reflex to ID Panel     Status: None (Preliminary result)   Collection Time: 11/05/21  8:13 AM   Specimen: BLOOD  Result Value Ref Range Status   Specimen Description BLOOD SITE NOT SPECIFIED  Final   Special Requests   Final    BOTTLES DRAWN AEROBIC ONLY Blood Culture results may not be optimal due to an inadequate volume of blood received in culture bottles   Culture   Final    NO GROWTH 4 DAYS Performed at [facility], [place] [address], [place],  [address]    Report Status PENDING  Incomplete     Discharge Instructions:   Discharge Instructions     Diet - low sodium heart healthy   Complete by: As directed    Discharge instructions   Complete by: As directed    Follow-up with your primary care provider in 1 week.  Check blood work at that time. Follow-up with your cardiologist as scheduled by the clinic.  Take medications as prescribed and complete the course of antibiotic..  Mechanical soft diet.   Increase activity slowly   Complete by: As directed    No wound care   Complete by: As directed       Allergies as of 11/09/2021       Reactions   Codeine Other (See Comments)   Unknown reaction, severe headach   Metformin And Related Diarrhea        Medication List     TAKE these medications    atorvastatin 20 MG tablet Commonly known as: LIPITOR TAKE 1 TABLET BY MOUTH EVERY DAY   carvedilol 25 MG tablet Commonly known as: COREG Take 1 tablet (25 mg total) by mouth 2  (two) times daily.   diltiazem 240 MG 24 hr tablet Commonly known as: Cardizem LA Take 1 tablet (240 mg total) by mouth daily.   empagliflozin 25 MG Tabs tablet Commonly known as: Jardiance Take 1 tablet (25 mg total) by mouth daily.   ferrous sulfate 325 (65 FE) MG EC tablet Take 325 mg by mouth 3 (three) times daily with meals.   FreeStyle Libre 2 Sensor Misc Use as directed to monitor blood glucose continuously. Change sensor Q 14 days. DX E11.65   furosemide 40 MG tablet Commonly known as: LASIX Take 1 tablet (40 mg total) by mouth as needed for fluid or edema.   glucose blood test strip 1 each by Other route as needed for other. Use as instructed   hydrALAZINE 50 MG tablet Commonly known as: APRESOLINE TAKE 1 TABLET BY MOUTH THREE TIMES A DAY   losartan 100 MG tablet Commonly known as: COZAAR TAKE 1 TABLET BY MOUTH EVERY DAY   OneTouch Verio IQ System w/Device Kit 1 each by Does not apply route 2 (two) times daily.   pioglitazone 30 MG tablet Commonly known as: ACTOS Take 30 mg by mouth daily.   warfarin 10 MG tablet  Commonly known as: COUMADIN Take as directed. If you are unsure how to take this medication, talk to your nurse or doctor. Original instructions: Take 0.5-1 tablets (5-10 mg total) by mouth daily. Pt takes 5 mg Mon-Wed-Fri-Sat and 10 mg Tues-Thurs-Sun               Durable Medical Equipment  (From admission, onward)           Start     Ordered   11/09/21 1129  For home use only DME Cane  Once        11/09/21 1128            [place] [place] [facility] Follow up.   Specialty: Home Health Services Why: Your home health has been set up with [facility]. The office will call you with start of care information. If you have any questions or concerns please call the number listed above. Contact information: [name] [address] [phone]         [name], MD  Follow up in 1 week(s).   Specialty: Family Medicine Contact information: [address] [place] [address] [phone]         Minus Breeding, MD .   Specialty: Cardiology Contact information: [address] [place] [address] [phone]                  Time coordinating discharge: 39 minutes  Signed:  Aaronjames Kelsay  [facility] 11/09/2021, 12:48 PM

## 2021-11-09 NOTE — Plan of Care (Signed)

## 2021-11-09 NOTE — Progress Notes (Signed)
Mobility Specialist Progress Note    11/09/21 1105  Mobility  Activity Ambulated with assistance in hallway  Level of Assistance Contact guard assist, steadying assist  Assistive Device Cane  Distance Ambulated (ft) 280 ft  Activity Response Tolerated well  $Mobility charge 1 Mobility   Pre-Mobility: 83 HR During Mobility: 115 HR  Pt received in bed and agreeable. No complaints on walk. Returned to sitting EOB with call bell in reach.    Hildred Alamin Mobility Specialist

## 2021-11-09 NOTE — Progress Notes (Signed)
1 PIV removed without complication, site clean dry and intact. Discharge instructions given to patient,

## 2021-11-09 NOTE — Progress Notes (Signed)
ANTICOAGULATION CONSULT NOTE - Follow Up Consult  Pharmacy Consult for Warfarin Indication: atrial fibrillation  Allergies  Allergen Reactions   Codeine Other (See Comments)    Unknown reaction, severe headach   Metformin And Related Diarrhea    Patient Measurements: Height: 5\' 9"  (175.3 cm) Weight: 73.4 kg (161 lb 13.1 oz) IBW/kg (Calculated) : 70.7  Vital Signs: Temp: 97.8 F (36.6 C) (09/21 0756) Temp Source: Oral (09/21 0756) BP: 102/75 (09/21 0756) Pulse Rate: 100 (09/21 0756)  Labs: Recent Labs    11/07/21 0636 11/08/21 0645 11/09/21 0726  HGB 9.6* 9.6* 10.8*  HCT 29.5* 29.2* 33.1*  PLT 136* 161 204  LABPROT 27.0* 26.8* 28.4*  INR 2.5* 2.5* 2.7*  CREATININE 1.52* 1.55* 1.87*     Estimated Creatinine Clearance: 37.8 mL/min (A) (by C-G formula based on SCr of 1.87 mg/dL (H)).  Assessment: 66 yr old male continues on Warfarin as PTA for atrial fibrillation. INR supratherapeutic (4.0) on admit 11/03/21 and up to 6.3 on 9/16.  Warfarin doses have been held >> INR down to 3.0 on 9/18 and Warfarin resumed with 5 mg x 1, as further drop expected after holding x 3 days. INR 2.5  on 9/19 and 10 mg dose given > INR 2.5  on 9/20.   Update 11/09/21: INR 2.7, remains therapeutic. S/p 5 days of Azithromycin for CAP, sometimes increases INR.  Off Azithromycin   Platelet count low stable> now in normal range improved upto 204k today 11/09/21 and , no bleeding reported.  MD orders in for discharge today.   No antibiotics on discharge.   Goal of Therapy:  INR 2-3 Monitor platelets by anticoagulation protocol: Yes   Plan:  Continue  Warfarin PTA dose 5 mg MWFSa, 10 mg TTSu I recommend to continue PTA Warfarin dose 5 mg MWFSa, 10 mg TTSun. Daily PT/INR as inpatient.  He should f/u with PCP  or anticoag clinic next week Monitor for signs/symptoms of bleeding.  Nicole Cella, RPh Clinical Pharmacist [phone] Please check AMION for all [place] phone numbers After 10:00 PM,  call [place] [phone] 11/09/2021 12:44 PM   11/09/2021,12:42 PM

## 2021-11-09 NOTE — TOC Progression Note (Addendum)
Transition of Care [facility]) - Progression Note    Patient Details  Name: WALI REINHEIMER MRN: [id number] Date of Birth: 1955-04-19  Transition of Care [facility]) CM/SW [place], RN Phone [phone]  11/09/2021, 9:49 AM  Clinical Narrative:    Home health referral has been accepted by [facility]. AVS updated  [address] ordered per [employer] and to be delivered to the bedside.   Expected Discharge Plan: [place] Barriers to Discharge: Continued Medical Work up  Expected Discharge Plan and Services Expected Discharge Plan: [place] In-house Referral: NA Discharge Planning Services: CM Consult Post Acute Care Choice: [place] arrangements for the past 2 months: Single Family Home                 DME Arranged: N/A DME Agency: NA                   Social Determinants of Health (SDOH) Interventions    Readmission Risk Interventions    11/08/2021    9:46 AM  Readmission Risk Prevention Plan  Transportation Screening Complete  PCP or Specialist Appt within 3-5 Days Complete  HRI or [place] Complete  Social Work Consult for [place] Planning/Counseling Complete  Palliative Care Screening Not Applicable  Medication Review Press photographer) Complete

## 2021-11-09 NOTE — Evaluation (Signed)
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 Zigmund Daniel, laser therapy OD   Seizures ([place])    pt reports this is from low blood sugar   Past Surgical History:  Past Surgical History:  Procedure Laterality Date   COLONOSCOPY     DIRECT LARYNGOSCOPY N/A 02/10/2020   Procedure: DIRECT LARYNGOSCOPY WITH BIOPSY;  Surgeon: [name], MD;  Location: [place];  Service: ENT;  Laterality: N/A;   Ear surgery as a child     IR GASTROSTOMY TUBE MOD SED  03/23/2020   IR GASTROSTOMY TUBE REMOVAL  06/01/2020   IR IMAGING GUIDED PORT INSERTION  03/09/2020   IR REMOVAL TUN ACCESS W/ PORT W/O FL MOD SED  02/08/2021   TONSILLECTOMY     HPI:  66 yo male admitted 9/15 with weakness, diarrhea and falls in Afib. 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 He had airway invasion with all liquids but could cough to expectorate thin liquids (PAS 7 unless cued for hard cough). He also had significant vallecular residue with cereal bar with good sensation but difficulty clearing. Dys 3 diet and thin liquids recommended. PMhx: CVA, HTN, HLD, CKD, DM, tongue CA.    Assessment / Plan / Recommendation  Clinical Impression  Pt has signs of pharyngeal dysphagia that appear to be consistent with presentation during OP MBS, performed with this SLP earlier this month. He subjectively denies any changes to his swallowing, but  does acknowledge that he relies on food delivered by his sister and that it is not always soft enough for him to swallow. We discussed foods that he feels like he can swallow well and encouraged him to work on a list of these items for his sister. He did have some throat clearing during thin liquid intake during this morning which was indicative of aspiration on MBS, so reinforced recommendation to cough harder to clear this, which was the most effective strategy on MBS (thicker liquids also did not eliminate airway invasion). Pt had also had delayed coughing and c/o residue with bites of a cracker. We discussed results from [facility] and suggested he consider a softer diet (Dys 2) while acutely deconditioned in the hospital. He does not want to try this as he is already concerned about the taste of the hospital food, but he is agreeable to making selections for softer foods and asking for extra sauce to make foods more moist. SLP will f/u acutely, recommending [facility] SLP f/u upon discharge. SLP Visit Diagnosis: Dysphagia, pharyngeal phase (R13.13)    Aspiration Risk  Mild aspiration risk;Moderate aspiration risk;Risk for inadequate nutrition/hydration    Diet Recommendation Dysphagia 3 (Mech soft);Thin liquid   Liquid Administration via: Cup;Straw Medication Administration: Whole meds with puree (crush if larger) Supervision: Patient able to self feed;Intermittent supervision to cue for compensatory strategies Compensations: Slow rate;Small sips/bites;Clear throat after  each swallow;Hard cough after swallow Postural Changes: Seated upright at 90 degrees;Remain upright for at least 30 minutes after po intake    Other  Recommendations Oral Care Recommendations: Oral care BID    Recommendations for follow up therapy are one component of a multi-disciplinary discharge planning process, led by the attending physician.  Recommendations may be updated based on patient status, additional functional criteria and  insurance authorization.  Follow up Recommendations Home health SLP      Assistance Recommended at Discharge Intermittent Supervision/Assistance  Functional Status Assessment Patient has had a recent decline in their functional status and demonstrates the ability to make significant improvements in function in a reasonable and predictable amount of time.  Frequency and Duration min 2x/week  2 weeks       Prognosis Prognosis for Safe Diet Advancement: Good      Swallow Study   General HPI: 66 yo male admitted 9/15 with weakness, diarrhea and falls in Afib. 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 He had airway invasion with all liquids but could cough to expectorate thin liquids (PAS 7 unless cued for hard cough). He also had significant vallecular residue with cereal bar with good sensation but difficulty clearing. Dys 3 diet and thin liquids recommended. PMhx: CVA, HTN, HLD, CKD, DM, tongue CA. Type of Study: Bedside Swallow Evaluation Previous Swallow Assessment: see HPI Diet Prior to this Study: Dysphagia 3 (soft);Thin liquids Temperature Spikes Noted: No Respiratory Status: Room air History of Recent Intubation: No Behavior/Cognition: Alert;Cooperative;Pleasant mood Oral Cavity Assessment: Within Functional Limits Oral Care Completed by SLP: No Oral Cavity - Dentition: Missing dentition Vision: Functional for self-feeding Self-Feeding Abilities: Able to feed self Patient Positioning: Upright in bed Baseline Vocal Quality: Normal Volitional Cough: Strong Volitional Swallow: Able to elicit    Oral/Motor/Sensory Function     Ice Chips Ice chips: Not tested   Thin Liquid Thin Liquid: Impaired Presentation: Self Fed;Straw Pharyngeal  Phase Impairments: Throat Clearing - Immediate    Nectar Thick Nectar Thick Liquid: Not tested   Honey Thick Honey Thick Liquid: Not tested   Puree Puree: Not tested   Solid     Solid:  Impaired Presentation: Self Fed Pharyngeal Phase Impairments: Cough - Delayed      Osie Bond., M.A. [place] Office [phone]  Secure chat preferred  11/09/2021,9:41 AM

## 2021-11-09 NOTE — TOC Progression Note (Signed)
Transition of Care [facility]) - Progression Note    Patient Details  Name: Brian [name] MRN: [id number] Date of Birth: May 30, 1955  Transition of Care [facility]) CM/SW [place], RN Phone [phone]  11/09/2021, 1:22 PM  Clinical Narrative:    CM received message that patients son is requesting to speak with case manager about patients disposition plan. CM at bedside to speak with patient for consent to speak with son. Patient states that he is aware that son is over protective and worried about patient returning home. Patient states I am capable of taking care of myself and I am not going anywhere but home.   CM spoke with son Brian [name]. Per Shanon Brow patient is living in unsanitary conditions and not able to take care of himself, Son made aware that patient states that he is going home. Son states that he will soon have to find a place to live because his mother is 33 and the aunts are pulling her out of the house. CM has made son aware that CM can not place patient without recommendation and patient being agreeable. Patient continues to state that he is going home. No other needs noted at this time. TOC will sign off.   Expected Discharge Plan: [place] Barriers to Discharge: Continued Medical Work up  Expected Discharge Plan and Services Expected Discharge Plan: [place] In-house Referral: NA Discharge Planning Services: CM Consult Post Acute Care Choice: [place] arrangements for the past 2 months: Single Family Home Expected Discharge Date: 11/09/21               DME Arranged: Kasandra Knudsen DME Agency: [employer] Date DME Agency Contacted: 11/09/21 Time DME Agency Contacted: 62 Representative spoke with at DME Agency: [place]: PT [place]: [facility] Date [place]: 11/09/21 Time [place]: [phone] Representative spoke with at [place]: [place] ([place]) Interventions    Readmission Risk Interventions    11/08/2021    9:46 AM  Readmission Risk Prevention Plan  Transportation Screening Complete  PCP or Specialist Appt within 3-5 Days Complete  HRI or [place] Complete  Social Work Consult for [place] Planning/Counseling Complete  Palliative Care Screening Not Applicable  Medication Review Press photographer) Complete

## 2021-11-10 ENCOUNTER — Telehealth: Payer: Self-pay | Admitting: *Deleted

## 2021-11-10 ENCOUNTER — Encounter: Payer: Self-pay | Admitting: *Deleted

## 2021-11-10 LAB — CULTURE, BLOOD (ROUTINE X 2)
Culture: NO GROWTH
Culture: NO GROWTH

## 2021-11-10 NOTE — Patient Outreach (Addendum)
  Care Coordination TOC Note Transition Care Management Unsuccessful Follow-up Telephone Call  Date of discharge and from where:  11/09/21 from [facility]  Attempts:  1st Attempt  Reason for unsuccessful TCM follow-up call:  Left voice message  Spoke with son, Marjory Lies, briefly and scheduled a follow-up call with Nat Christen, [name] for Monday at 1:30 to discuss Medicaid and living arrangement options for Mr [place]g. Provided with Joanna's telephone number. Updated [name]. Reached out to Dr Percival Spanish requesting that he review the hospital notes and if patient needs to be seen sooner than his 3 month f/u on 10/9 that he have his staff call patient to schedule.   Chong Sicilian, BSN, RN-BC RN Care Coordinator [place] Direct Dial: [phone] Main #: [phone]

## 2021-11-13 ENCOUNTER — Ambulatory Visit: Payer: Self-pay | Admitting: *Deleted

## 2021-11-13 ENCOUNTER — Other Ambulatory Visit: Payer: Self-pay

## 2021-11-13 ENCOUNTER — Telehealth: Payer: Self-pay | Admitting: *Deleted

## 2021-11-13 NOTE — Telephone Encounter (Signed)
Pharmacy faxed a refill request for diltiazem (CARDIZEM LA) 240 MG 24 hr tablet [[id number]]    Order Details Dose: 240 mg Route: Oral Frequency: Daily  Dispense Quantity: 90 tablet Refills: 3        Sig: Take 1 tablet (240 mg total) by mouth daily.       Start Date: 09/21/21 End Date: --  Written Date: 09/21/21 Expiration Date: 09/21/22

## 2021-11-13 NOTE — Patient Outreach (Signed)
  Care Coordination TOC Note Transition Care Management Unsuccessful Follow-up Telephone Call  Date of discharge and from where:  11/09/21 from [facility]  Attempts:  2nd Attempt  Reason for unsuccessful TCM follow-up call:  Left voice message  Talked with son briefly last week about resource needs. Scheduled an appt for son to discuss this with [name] today. Staff message sent to [place] to coordinate appointment one week f/u appt with Dr Dennard Schaumann.  Chong Sicilian, BSN, RN-BC RN Care Coordinator [place] Direct Dial: [phone] Main #: [phone]

## 2021-11-14 ENCOUNTER — Encounter: Payer: Self-pay | Admitting: *Deleted

## 2021-11-14 ENCOUNTER — Telehealth: Payer: Self-pay | Admitting: *Deleted

## 2021-11-14 NOTE — Therapy (Signed)
[facility] [address] [place], [name], [address] Phone: [phone]   Fax:  [phone]  Patient Details  Name: Brian [name] MRN: [id number] Date of Birth: 1955/04/10 Referring Provider:  [name], MD  Encounter Date: 11/14/2021  SPEECH THERAPY DISCHARGE SUMMARY  Visits from Start of Care: 4  Current functional level related to goals / functional outcomes: Pt did not return after his last visit in 2022. Goals and plan from his last scheduled session below:      SLP Short Term Goals - 05/27/20 1514              SLP SHORT TERM GOAL #1    Title Pt will complete HEP with rare min A over two sessions     Time 1     Period --   visits, for all STGs    Status On-going          SLP SHORT TERM GOAL #2    Title Pt will tell SLP rationale for HEP completion     Status Not Met          SLP SHORT TERM GOAL #3    Title Pt will tell SLP how a food journal can hasten/facilitate return to more normalized diet     Status Deferred                     SLP Long Term Goals - 05/27/20 1515              SLP LONG TERM GOAL #1    Title Pt will complete HEP with rare min A over 3 visits     Time 3     Period --   visits, for all LTGs    Status Revised          SLP LONG TERM GOAL #2    Title Pt will tell SLP 3 overt s/s aspiration PNA with modified independence in 2 sessions     Time 2     Status On-going          SLP LONG TERM GOAL #3    Title Pt will tell SLP when to decr frequency of HEP with modified independence     Time 4     Status Revised                     Plan - 05/27/20 1502     Clinical Impression Statement Eilan presents today with WNL/WFL swallowing ability with peanut better crackersand water .  No overt s/sx oral or pharyngeal difficulty, or of aspiration PNA reported or observed today. Data suggests that as pts progress through rad or chemorad therapy that their swallowing ability will decrease. Also,  WNL swallowing is threatened by muscle fibrosis that will likely develop after rad/chemorad is completed. See "other comments" for more details of today's session. SLP reviewed his individualized exercise program. ST is still necessary for addressing need for objective swallow assessment, as well as to assess accurate completion of swallowing HEP. If pt continues to demonstrate limited committment to completing HEP and remains safe with POs, he may be d/c'd next session or next 2 sessions.       Remaining deficits: Assumed that deficits remain.   Education / Equipment: HEP procedure, late effects head/neck radiation on swallowing, need to complete HEP as directed for best possible swallowing outcomes.   Patient agrees to discharge. Patient goals were partially  met. Patient is being discharged due to not returning since the last visit.Marland Kitchen    [place], [place] 11/14/2021, 11:06 PM  [facility] [address], [place], [name], [address] Phone: [phone]   Fax:  [phone]

## 2021-11-14 NOTE — Patient Outreach (Signed)
  Care Coordination [facility] Note Transition Care Management Follow-up Telephone Call Date of discharge and from where: 11/09/21 from [facility] How have you been since you were released from the hospital? "I'm doing great baby" Previously spoke with son who reports that patient is living in unsanitary conditions and that he isn't as able to take care of himself as he says. Connected son with Nat Christen, [name] for assistance.  Any questions or concerns? Yes. Cost of [place] is too high. Provided verbal information on Medicare Extra Help. Emailed son information for online application. He has also received information on Medicaid as well from [name]. Referral to PharmD for prescription assistance in the interim.  Items Reviewed: Did the pt receive and understand the discharge instructions provided? Yes  Medications obtained and verified? Yes  Other? No  Any new allergies since your discharge? No  Dietary orders reviewed? Yes Do you have support at home? Yes   Home Care and Equipment/Supplies: Were home health services ordered? yes If so, what is the name of the agency? [facility] [phone]  Has the agency set up a time to come to the patient's home? No. Reached out to [facility] and left a voicemail for them to return my call regarding referral for services at [place] discharge. Were any new equipment or medical supplies ordered?  No What is the name of the medical supply agency? N/a Were you able to get the supplies/equipment? not applicable Do you have any questions related to the use of the equipment or supplies? No  Functional Questionnaire: (I = Independent and D = Dependent) ADLs: I (children provide transportation)  Bathing/Dressing- I  Meal Prep- I  Eating- I  Maintaining continence- I  Transferring/Ambulation- I  Managing Meds- I  Follow up appointments reviewed:  PCP Hospital f/u appt confirmed? No Staff message sent to [place] to coordinate  appointment. [facility] f/u appt confirmed? No  Scheduled to see Dr Percival Spanish on 11/27/21 @ 4:20. Are transportation arrangements needed? No  If their condition worsens, is the pt aware to call PCP or go to the Emergency Dept.? Yes Was the patient provided with contact information for the PCP's office or ED? Yes Was to pt encouraged to call back with questions or concerns? Yes  SDOH assessments and interventions completed:   Yes  Care Coordination Interventions Activated:  Yes   Care Coordination Interventions:  PCP follow up appointment requested Referred for Care Coordination Services:  Pharmacist Reached out to [facility] to coordinate services   Emailed information on [place] Extra Help to son   Encounter Outcome:  Pt. Visit Completed    Chong Sicilian, BSN, RN-BC RN Care Coordinator [place]: [phone] Main #: [phone]

## 2021-11-14 NOTE — Telephone Encounter (Signed)
Requested Prescriptions  Pending Prescriptions Disp Refills  . diltiazem (CARDIZEM LA) 240 MG 24 hr tablet 90 tablet 3    Sig: Take 1 tablet (240 mg total) by mouth daily.     Cardiovascular: Calcium Channel Blockers 3 Failed - 11/13/2021 10:24 AM      Failed - Cr in normal range and within 360 days    Creat  Date Value Ref Range Status  06/01/2021 2.19 (H) 0.70 - 1.35 mg/dL Final   Creatinine, Ser  Date Value Ref Range Status  11/09/2021 1.87 (H) 0.61 - 1.24 mg/dL Final   Creatinine, Urine  Date Value Ref Range Status  12/12/2009 226.8 mg/dL Final         Passed - ALT in normal range and within 360 days    ALT  Date Value Ref Range Status  11/03/2021 12 0 - 44 U/L Final  03/20/2021 13 0 - 44 U/L Final         Passed - AST in normal range and within 360 days    AST  Date Value Ref Range Status  11/03/2021 16 15 - 41 U/L Final  03/20/2021 14 (L) 15 - 41 U/L Final         Passed - Last BP in normal range    BP Readings from Last 1 Encounters:  11/09/21 102/75         Passed - Last Heart Rate in normal range    Pulse Readings from Last 1 Encounters:  11/09/21 100         Passed - Valid encounter within last 6 months    Recent Outpatient Visits          4 months ago Atrial fibrillation, unspecified type ([place])   [place] [name], MD   5 months ago Atrial fibrillation, unspecified type [facility])   [place] [name], MD   7 months ago Atrial fibrillation, unspecified type [facility])   [place] [name], MD   7 months ago Atrial fibrillation, unspecified type [facility])   [place] [name], MD   8 months ago Atrial fibrillation, unspecified type [facility])   [place] Pickard, Cammie Mcgee, MD      Future Appointments            In 1 week Minus Breeding, MD [place]. [place]

## 2021-11-14 NOTE — Patient Instructions (Signed)
Visit Information  Thank you for taking time to visit with me today. Please don't hesitate to contact me if I can be of assistance to you.   Following are the goals we discussed today:   Goals Addressed             This Visit's Progress    Receive Assistance Applying for [place] Medicaid and Applying for [place].   On track    Care Coordination Interventions:  Discussed plans with patient for ongoing care management follow up. Provided patient and son with direct contact information for care management team. Screened for signs and symptoms of depression related to chronic disease state.  Assessed social determinant of health barriers. Active listening/reflection utilized.  Problem solving/task-centered strategies developed. Quality of sleep assessed and sleep hygiene techniques promoted.  Verbalization of feelings encouraged.  Discussed caregiver resources and support with son. Emailed the following list of agencies and resources to son, on 11/13/2021:             [place] Instructions Personal Care Services Providers 2023 Medicaid Tips On-Line Medicaid Application Medicaid Application  [place] Approved Assisted Living Facilities, [facility] and [place] Living Facilities in [place] in [place] and Facilities in [place] Adult [place] Application Encouraged patient and son to review the list of agencies and resources provided, and be prepared to discuss during our next scheduled telephone outreach call.         Our next appointment is by telephone on 11/23/2021 at 12:45 pm.  Please call the care guide team at [phone] if you need to cancel or reschedule your appointment.   If you  are experiencing a Mental Health or [place] or need someone to talk to, please call the Suicide and Crisis Lifeline: 988 call the [place] National Suicide Prevention Lifeline: 475 812 5248 or TTY: 682-094-2595 TTY 541-037-7860) to talk to a trained counselor call 1-800-273-TALK (toll free, 24 hour hotline) go to [facility] [address], [place] [phone]) call the [place]: [phone] call 911  Patient verbalizes understanding of instructions and care plan provided today and agrees to view in [place]. Active MyChart status and patient understanding of how to access instructions and care plan via MyChart confirmed with patient.     Telephone follow up appointment with care management team member scheduled for:  11/23/2021 at 12:45 pm.  Nat Christen, BSW, MSW, [place]  Licensed Clinical Social Worker  [place]  Mailing [place]. [address] [place], [place] [address] Physical Address-[address] [place], [place] [address] Toll Free Main # [phone] Fax # [phone] Cell # [phone] [email or web address]

## 2021-11-14 NOTE — Patient Outreach (Signed)
  Care Coordination   Initial Visit Note   11/14/2021  Name: Brian [name] MRN: [id number] DOB: 1956/01/02  Brian [name] is a 66 y.o. year old male who sees Pickard, Cammie Mcgee, MD for primary care. I spoke with Brian [name] and son, Brian [name] by phone today.  What matters to the patients health and wellness today?  Receive Assistance Applying for [place] Medicaid and Applying for [place].   Goals Addressed             This Visit's Progress    Receive Assistance Applying for [place] Medicaid and Applying for [place].   On track    Care Coordination Interventions:  Discussed plans with patient for ongoing care management follow up. Provided patient and son with direct contact information for care management team. Screened for signs and symptoms of depression related to chronic disease state.  Assessed social determinant of health barriers. Active listening/reflection utilized.  Problem solving/task-centered strategies developed. Quality of sleep assessed and sleep hygiene techniques promoted.  Verbalization of feelings encouraged.  Discussed caregiver resources and support with son. Emailed the following list of agencies and resources to son, on 11/13/2021:             [place] Instructions Personal Care Services Providers 2023 Medicaid Tips On-Line Medicaid Application Medicaid Application  [place] Approved Assisted Living Facilities, Rest Homes and [place] Living Facilities in [place] in [place] and Facilities in [place] Adult [place] Application Encouraged patient and son to review the list of agencies and resources provided, and  be prepared to discuss during our next scheduled telephone outreach call.         SDOH assessments and interventions completed:  Yes.  SDOH Interventions Today    Flowsheet Row Most Recent Value  SDOH Interventions   Food Insecurity Interventions Intervention Not Indicated, Other (Comment)  [Verified by Dewayne Shorter Staffieri]  Housing Interventions Intervention Not Indicated, Other (Comment)  [Verified by son, Marjory Lies Eddie]  Transportation Interventions Intervention Not Indicated, Other (Comment)  [Verified by son, Marjory Lies [place]]  Utilities Interventions Intervention Not Indicated, Other (Comment)  [Verified by son, Marjory Lies Gu[place]upe]  Alcohol Usage Interventions Intervention Not Indicated (Score <7), Other (Comment)  [Verified by son, Marjory Lies Dar[place]ch]  Financial Strain Interventions Intervention Not Indicated, Other (Comment)  [Verified by son, Marjory Lies [place]]  Physical Activity Interventions Intervention Not Indicated, Other (Comments)  [Verified by son, Marjory Lies Dung]  Stress Interventions Intervention Not Indicated, Other (Comment)  [Verified by son, Marjory Lies [place]]  Social Connections Interventions Intervention Not Indicated, Other (Comment)  [Verified by son, Marjory Lies [place]y]        Care Coordination Interventions Activated:  Yes.   Care Coordination Interventions:  Yes, provided.   Follow up plan: Follow up call scheduled for 11/23/2021 at 12:45 pm.  Encounter Outcome:  Pt. Visit Completed.   Nat Christen, BSW, MSW, [name]  Licensed Education officer, environmental Health System  Mailing [place] [address], [place], [place] [address] Physical Address-[address], [place], [place] [address] Toll Free Main # [phone] Fax # [phone] Cell # [phone] [email or web address]

## 2021-11-16 ENCOUNTER — Ambulatory Visit: Payer: Medicare HMO

## 2021-11-16 DIAGNOSIS — N1832 Chronic kidney disease, stage 3b: Secondary | ICD-10-CM | POA: Diagnosis not present

## 2021-11-16 DIAGNOSIS — R600 Localized edema: Secondary | ICD-10-CM | POA: Diagnosis not present

## 2021-11-16 DIAGNOSIS — E1122 Type 2 diabetes mellitus with diabetic chronic kidney disease: Secondary | ICD-10-CM | POA: Diagnosis not present

## 2021-11-16 DIAGNOSIS — I129 Hypertensive chronic kidney disease with stage 1 through stage 4 chronic kidney disease, or unspecified chronic kidney disease: Secondary | ICD-10-CM | POA: Diagnosis not present

## 2021-11-16 DIAGNOSIS — D509 Iron deficiency anemia, unspecified: Secondary | ICD-10-CM | POA: Diagnosis not present

## 2021-11-16 DIAGNOSIS — N189 Chronic kidney disease, unspecified: Secondary | ICD-10-CM | POA: Diagnosis not present

## 2021-11-16 DIAGNOSIS — E785 Hyperlipidemia, unspecified: Secondary | ICD-10-CM | POA: Diagnosis not present

## 2021-11-16 DIAGNOSIS — I639 Cerebral infarction, unspecified: Secondary | ICD-10-CM | POA: Diagnosis not present

## 2021-11-19 DIAGNOSIS — I5032 Chronic diastolic (congestive) heart failure: Secondary | ICD-10-CM | POA: Insufficient documentation

## 2021-11-19 NOTE — Progress Notes (Unsigned)
Cardiology Office Note   Date:  11/21/2021   ID:  Brian [name], DOB 01-Jul-1955, MRN [id number]  PCP:  [name], MD  Cardiologist:   Minus Breeding, MD Referring:  [name], MD  Chief Complaint  Patient presents with   Atrial Fibrillation       History of Present Illness: Brian [name] is a 66 y.o. male who is referred for evaluation of syncope.  He has a history of atrial fibrillation and cardiomyopathy.  I see an ejection fraction from 2011 of 25 to 30%.  He had a CVA treated with tPA at that time.   He had difficult to control hypertension.  I saw him last in 2013.  At that time he was said to have an ejection fraction of 60% although I do not see this result.  He did have a stress perfusion study in 2012.    He has had rapid rate a fib managed with medications .  At the last visit I stopped Actos and started Jardiance.  However, he had no roadblock with the donut hole and has not been able to do this.  Of note he was in the hospital 11/03/2021 and I reviewed these records for this visit.  He was thought to have pneumonia.  There was 1 blood culture positive for E. coli probably a urinary source if it was real.  He had atrial for with rapid rate.  He had a fall at home.  I do not see any other acute cardiac issues.  It does not look like his meds were changed at the time of that visit.   An echocardiogram demonstrated still well-preserved ejection fraction.  He did have a little acute on chronic renal insufficiency.  He still lives at home.  His son is trying to get him some other situation but the patient is reluctant.  He has not had any chest pressure, neck or arm discomfort.  He does not feel his atrial fibrillation.  Has had some chronic lower extremity swelling but he drinks quite a bit of water and does not watch his salt intake.  He is not describing PND or orthopnea.   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 Zigmund Daniel, laser therapy OD   Seizures ([place])    pt reports this is from low blood sugar    Past Surgical History:  Procedure Laterality Date   COLONOSCOPY     DIRECT LARYNGOSCOPY N/A 02/10/2020   Procedure: DIRECT LARYNGOSCOPY WITH BIOPSY;  Surgeon: [name], MD;  Location[facility] OR;  Service: ENT;  Laterality: N/A;   Ear surgery as a child     IR GASTROSTOMY TUBE MOD SED  03/23/2020   IR GASTROSTOMY TUBE REMOVAL  06/01/2020   IR IMAGING GUIDED PORT INSERTION  03/09/2020   IR REMOVAL TUN ACCESS W/ PORT W/O FL MOD SED  02/08/2021   TONSILLECTOMY       Current Outpatient Medications  Medication Sig Dispense Refill   atorvastatin (LIPITOR) 20 MG tablet TAKE 1 TABLET BY MOUTH EVERY DAY 90 tablet 3   Blood Glucose Monitoring Suppl (  ONETOUCH VERIO IQ SYSTEM) w/Device KIT 1 each by Does not apply route 2 (two) times daily. 1 kit 0   carvedilol (COREG) 25 MG tablet Take 1 tablet (25 mg total) by mouth 2 (two) times daily. 60 tablet 2   Continuous Blood Gluc Sensor (FREESTYLE LIBRE 2 SENSOR) MISC Use as directed to monitor blood glucose continuously. Change sensor Q 14 days. DX E11.65 2 each 11   diltiazem (CARDIZEM LA) 240 MG 24 hr tablet Take 1 tablet (240 mg total) by mouth daily. 90 tablet 3   ferrous sulfate 325 (65 FE) MG EC tablet Take 325 mg by mouth 3 (three) times daily with meals.     furosemide (LASIX) 40 MG tablet Take 1 tablet (40 mg total) by mouth as needed for fluid or edema. 30 tablet 3   glucose blood test strip 1 each by Other route as needed for other. Use as instructed 100 each 11   hydrALAZINE (APRESOLINE) 50 MG tablet TAKE 1 TABLET BY MOUTH THREE TIMES A DAY 270 tablet 1   losartan (COZAAR) 100 MG tablet TAKE 1 TABLET BY MOUTH EVERY DAY 90  tablet 3   pioglitazone (ACTOS) 30 MG tablet Take 30 mg by mouth daily.     warfarin (COUMADIN) 10 MG tablet Take 0.5-1 tablets (5-10 mg total) by mouth daily. Pt takes 5 mg Mon-Wed-Fri-Sat and 10 mg Tues-Thurs-Sun     empagliflozin (JARDIANCE) 25 MG TABS tablet Take 1 tablet (25 mg total) by mouth daily. (Patient not taking: Reported on 11/21/2021) 90 tablet 3   No current facility-administered medications for this visit.    Allergies:   Codeine and Metformin and related    ROS:  Please see the history of present illness.   Otherwise, review of systems are positive for none.   All other systems are reviewed and negative.    PHYSICAL EXAM: VS:  BP (!) 152/103   Pulse (!) 103   Ht 5' 9"  (1.753 m)   Wt 190 lb 6.4 oz (86.4 kg)   SpO2 100%   BMI 28.12 kg/m  , BMI Body mass index is 28.12 kg/m. GENERAL:  Well appearing NECK:  No jugular venous distention, waveform within normal limits, carotid upstroke brisk and symmetric, no bruits, no thyromegaly LUNGS:  Clear to auscultation bilaterally CHEST:  Unremarkable HEART:  PMI not displaced or sustained,S1 and S2 within normal limits, no S3, no clicks, no rubs, no murmurs, irregular ABD:  Flat, positive bowel sounds normal in frequency in pitch, no bruits, no rebound, no guarding, no midline pulsatile mass, no hepatomegaly, no splenomegaly EXT:  2 plus pulses throughout, moderate bilateral leg edema, no cyanosis no clubbing  EKG:  EKG is  ordered today. Atrial fibrillation, rate 103, left axis deviation, low voltage in limb leads, poor anterior R wave progression, possible old anteroseptal infarct, premature ventricular contractions with right bundle branch block morphology suspect a variant conduction   Recent Labs:  09/21/2021: TSH 2.94 11/03/2021: ALT 12 11/07/2021: Magnesium 1.7 11/09/2021: BUN 22; Creatinine, Ser 1.87; Hemoglobin 10.8; Platelets 204; Potassium 4.3; Sodium 133    Lipid Panel    Component Value Date/Time   CHOL 135  11/30/2019 0915   TRIG 170 (H) 11/30/2019 0915   HDL 29 (L) 11/30/2019 0915   CHOLHDL 4.7 11/30/2019 0915   VLDL 37 (H) 09/25/2016 0914   LDLCALC 79 11/30/2019 0915      Wt Readings from Last 3 Encounters:  11/21/21 190 lb 6.4 oz (86.4 kg)  11/09/21  161 lb 13.1 oz (73.4 kg)  10/04/21 176 lb 4 oz (79.9 kg)      Other studies Reviewed: Additional studies/ records that were reviewed today include: Hospital records Review of the above records demonstrates:  Please see elsewhere in the note.     ASSESSMENT AND PLAN:  Atrial fibrillation:       His rate is elevated but he has been off his Cardizem because he had trouble getting the prescription but they now have this ready for him at the pharmacy.  He says his heart rate is typically in the 90s.  He tolerates anticoagulation.  No change in therapy.   Syncope: He has had no further syncope.  No change in therapy.  He has had no further syncope.  No change in therapy or further evaluation.  CKD 3B: His creatinine was elevated at admission but back down to baseline 1.7 at discharge.  No change in therapy.   EDEMA: The patient has increased lower extremity swelling.  We talked at length about more conservative therapies and ways to avoid increased diuretic or reducing his salt, keeping his feet elevated and reducing his fluid intake.   DM:    His son is looking into whether he can get off of his Actos and start the Jardiance right now the cost is prohibitive.  They are working through this.    Current medicines are reviewed at length with the patient today.  The patient does not have concerns regarding medicines.  The following changes have been made:  None  Labs/ tests ordered today include: Non  Orders Placed This Encounter  Procedures   EKG 12-Lead    Disposition:   FU with me in six months.    Signed, Minus Breeding, MD  11/21/2021 5:33 PM    [facility]

## 2021-11-21 ENCOUNTER — Ambulatory Visit: Payer: Medicare HMO | Attending: Cardiology | Admitting: Cardiology

## 2021-11-21 ENCOUNTER — Other Ambulatory Visit: Payer: Self-pay | Admitting: Cardiology

## 2021-11-21 ENCOUNTER — Encounter: Payer: Self-pay | Admitting: Cardiology

## 2021-11-21 VITALS — BP 152/103 | HR 103 | Ht 69.0 in | Wt 190.4 lb

## 2021-11-21 DIAGNOSIS — I5032 Chronic diastolic (congestive) heart failure: Secondary | ICD-10-CM | POA: Diagnosis not present

## 2021-11-21 DIAGNOSIS — I4891 Unspecified atrial fibrillation: Secondary | ICD-10-CM

## 2021-11-21 NOTE — Patient Instructions (Signed)
Medication Instructions:  Your physician recommends that you continue on your current medications as directed. Please refer to the Current Medication list given to you today.  *If you need a refill on your cardiac medications before your next appointment, please call your pharmacy*  Follow-Up: At [facility], you and your health needs are our priority.  As part of our continuing mission to provide you with exceptional heart care, we have created designated Provider Care Teams.  These Care Teams include your primary Cardiologist (physician) and Advanced Practice Providers (APPs -  Physician Assistants and Nurse Practitioners) who all work together to provide you with the care you need, when you need it.  We recommend signing up for the patient portal called "MyChart".  Sign up information is provided on this After Visit Summary.  MyChart is used to connect with patients for Virtual Visits (Telemedicine).  Patients are able to view lab/test results, encounter notes, upcoming appointments, etc.  Non-urgent messages can be sent to your provider as well.   To learn more about what you can do with MyChart, go to [email or web address].    Your next appointment:   6 month(s)  The format for your next appointment:   In Person  Provider:   Minus Breeding, MD

## 2021-11-22 ENCOUNTER — Telehealth: Payer: Self-pay

## 2021-11-22 NOTE — Telephone Encounter (Signed)
Pt's son, Marjory Lies called stating pt has been unable to get his Diltiazem (Cardizem LA) 240 mg from the pharmacy. I called [facility] and they said the pt's insurance will no longer cover that brand of medication but the insurance did not give them a list of what they would cover. Can we send in a new prescription for the patient? Thank you.

## 2021-11-23 ENCOUNTER — Other Ambulatory Visit: Payer: Self-pay | Admitting: Family Medicine

## 2021-11-23 ENCOUNTER — Encounter: Payer: Self-pay | Admitting: *Deleted

## 2021-11-23 ENCOUNTER — Ambulatory Visit: Payer: Self-pay | Admitting: *Deleted

## 2021-11-23 MED ORDER — DILTIAZEM HCL ER COATED BEADS 120 MG PO CP24: 240.0000 mg | ORAL_CAPSULE | Freq: Every day | ORAL | 5 refills | Status: DC

## 2021-11-23 NOTE — Patient Outreach (Signed)
  Care Coordination   Follow Up Visit Note   11/23/2021  Name: Brian [name] MRN: [id number] DOB: 13-Dec-1955  Brian [name] is a 66 y.o. year old male who sees Pickard, Cammie Mcgee, MD for primary care. I spoke with patient's son, Brian [name] by phone today.  What matters to the patients health and wellness today?   Receive Assistance Applying for [place] Medicaid and Applying for [place].   Goals Addressed             This Visit's Progress    Receive Assistance Applying for [place] Medicaid and Applying for [place].   On track    Care Coordination Interventions:  Problem solving/task-centered strategies employed. Discussed caregiver resources and support with son. Emailed the following list of agencies and resources to son, on 11/13/2021 and again on 11/23/2021:             [place] Instructions Personal Care Services Providers 2023 Medicaid Tips On-Line Medicaid Application Medicaid Application  [place] Approved Assisted Living Facilities, Rest Homes and [place] Living Facilities in [place] in [place] and Facilities in [place] Adult [place] Application Encouraged patient and son to review the list of agencies and resources provided, and be prepared to discuss during our next scheduled telephone outreach call.       SDOH assessments and interventions completed:  Yes.   Care Coordination Interventions Activated:  Yes.    Care Coordination Interventions:  Yes, Provided.    Follow up plan: Telephone Outreach Call Scheduled on 12/05/2021 at 10:30 am.   Encounter Outcome:  Pt. Visit Completed.    Nat Christen, BSW, MSW, [name]   Licensed Education officer, environmental Health System  Mailing [place] [address] [place], [place] [address] Physical Address-[address] [place], [place] [address] Toll Free Main # [phone] Fax # [phone] Cell # [phone] [email or web address]

## 2021-11-23 NOTE — Patient Instructions (Signed)
Visit Information  Thank you for taking time to visit with me today. Please don't hesitate to contact me if I can be of assistance to you.   Following are the goals we discussed today:   Goals Addressed             This Visit's Progress    Receive Assistance Applying for [place] Medicaid and Applying for [place].   On track    Care Coordination Interventions:  Problem solving/task-centered strategies employed. Discussed caregiver resources and support with son. Emailed the following list of agencies and resources to son, on 11/13/2021 and again on 11/23/2021:             [place] Instructions Personal Care Services Providers 2023 Medicaid Tips On-Line Medicaid Application Medicaid Application  [place] Approved Assisted Living Facilities, Rest Homes and [place] Living Facilities in [place] in [place] and Facilities in [place] Adult [place] Application Encouraged patient and son to review the list of agencies and resources provided, and be prepared to discuss during our next scheduled telephone outreach call.         Our next appointment is by telephone on 12/05/2021 at 10:30 am.  Please call the care guide team at [phone] if you need to cancel or reschedule your appointment.   If you are experiencing a Mental Health or [place] or need someone to talk to, please call the Suicide and Crisis Lifeline: 988 call the [place] National Suicide Prevention Lifeline: 715-124-2274 or TTY: 587-472-1255 TTY (209) 775-1904) to talk to a trained counselor call 1-800-273-TALK (toll free, 24 hour hotline) go to [facility] [address] [place]  [phone]) call the [place]: 934-588-8911 call 911  Patient verbalizes understanding of instructions and care plan provided today and agrees to view in [place]. Active MyChart status and patient understanding of how to access instructions and care plan via MyChart confirmed with patient.     Telephone follow up appointment with care management team member scheduled for:  12/05/2021 at 10:30 am.  Nat Christen, BSW, MSW, [place]  Licensed Clinical Social Worker  [place]  Mailing [place]. [address] [place], [place] [address] Physical Address[address], [place], [place] [address] Toll Free Main # [phone] Fax # [phone] Cell # [phone] [email or web address]

## 2021-11-27 ENCOUNTER — Ambulatory Visit: Payer: Medicare HMO | Admitting: Cardiology

## 2021-11-29 ENCOUNTER — Ambulatory Visit: Payer: Medicare HMO

## 2021-12-05 ENCOUNTER — Ambulatory Visit: Payer: Self-pay | Admitting: *Deleted

## 2021-12-05 ENCOUNTER — Encounter: Payer: Self-pay | Admitting: *Deleted

## 2021-12-05 NOTE — Patient Outreach (Signed)
  Care Coordination   Follow Up Visit Note   12/05/2021  Name: Brian [name] MRN: [id number] DOB: Oct 10, 1955  Brian [name] is a 66 y.o. year old male who sees Pickard, Cammie Mcgee, MD for primary care. I spoke with patient's son, Keyontay Stolz by phone today.  What matters to the patients health and wellness today?   Receive Assistance Applying for [place] Medicaid and Placement into a [place].   Goals Addressed             This Visit's Progress    Receive Assistance Applying for [place] Medicaid and Placement into a [place].   On track    Care Coordination Interventions:  Emotional support provided. Caregiver stress acknowledged. Verbalization of feelings encouraged. Reviewed the following list of agencies and resources with son:               [place] Instructions Personal Care Services Providers 2023 Medicaid Tips On-Line Medicaid Application Medicaid Application  [place] Approved Assisted Living Facilities, Rest Homes and [place] Living Facilities in [place] in [place] and Facilities in [facility] Adult [place] Medicaid Application Encouraged son to complete [place] Medicaid application, through the [employer].      SDOH assessments and interventions completed:  Yes.  Care Coordination Interventions Activated:  Yes.   Care Coordination Interventions:  Yes, provided.   Follow up plan: Follow up call scheduled for 12/19/2021 at 9:30 am.  Encounter Outcome:  Pt. Visit Completed.   Brian [name], BSW, MSW, [name]  Licensed Brewing technologist Health System  Mailing [place] [address] [place], [place] [address] Physical Address[address], [place],  [address] Toll Free Main # [phone] Fax # [phone] Cell # [phone] [email or web address]

## 2021-12-05 NOTE — Patient Instructions (Signed)
Visit Information  Thank you for taking time to visit with me today. Please don't hesitate to contact me if I can be of assistance to you.   Following are the goals we discussed today:   Goals Addressed             This Visit's Progress    Receive Assistance Applying for [place] Medicaid and Placement into a [place].   On track    Care Coordination Interventions:  Emotional support provided. Caregiver stress acknowledged. Verbalization of feelings encouraged. Reviewed the following list of agencies and resources with son:               [place] Instructions Personal Care Services Providers 2023 Medicaid Tips On-Line Medicaid Application Medicaid Application  [place] Approved Assisted Living Facilities, Rest Homes and [place] Living Facilities in [place] in [place] and Facilities in [facility] Adult [place] Medicaid Application Encouraged son to complete [place] Medicaid application, through the [place].        Our next appointment is by telephone on 12/19/2021 at 9:30 am.  Please call the care guide team at [phone] if you need to cancel or reschedule your appointment.   If you are experiencing a Mental Health or [place] or need someone to talk to, please call the Suicide and Crisis Lifeline: 988 call the [place] National Suicide Prevention Lifeline: 402 756 5726 or TTY: 228-402-1003 TTY 6011751912) to talk to a trained counselor call 1-800-273-TALK (toll free, 24 hour hotline) go to [facility] [address], [place] [phone]) call the [place]: 437-150-7318 call 911  Patient verbalizes understanding of instructions and care plan provided today and agrees to view in [place]. Active MyChart status and patient understanding of how to access instructions and care plan via MyChart confirmed with patient.     Telephone follow up appointment with care management team member scheduled for:  12/19/2021 at 9:30 am.  Nat Christen, BSW, MSW, [place]  Licensed Clinical Social Worker  [place]  Mailing [place]. [address] [place], [place] [address] Physical Address-[address], [place], [place] [address] Toll Free Main # [phone] Fax # [phone] Cell # [phone] [email or web address]

## 2021-12-06 ENCOUNTER — Ambulatory Visit: Payer: Medicare HMO | Attending: Radiation Oncology

## 2021-12-06 ENCOUNTER — Other Ambulatory Visit: Payer: Self-pay

## 2021-12-06 DIAGNOSIS — R1313 Dysphagia, pharyngeal phase: Secondary | ICD-10-CM | POA: Insufficient documentation

## 2021-12-06 NOTE — Patient Instructions (Signed)
  SWALLOW EXERCISES:   do these twice a day *Effortful swallow: Push whole tongue up to the roof of your mouth for 1-2 seconds Swallow as hard as you can  10-15 times You can use one-two drops of water if your mouth gets dry  *Tongue-out Swallow ([place] Swallow) Put out your tongue and  hold it with your teeth while you swallow 10-15 times   you can use one-two drops of water if your mouth gets dry   I'll give you more exercises next time.  WHEN YOU EAT:   have soft foods  Clear your throat and swallow with every sip of liquids Start meals with 4 or 5 single sips of warm water to loosen muscles up (throat clear afterwards!) SMall bites and sips Eat slowly

## 2021-12-06 NOTE — Therapy (Signed)
OUTPATIENT SPEECH LANGUAGE PATHOLOGY SWALLOW EVALUATION   Patient Name: Brian [name] MRN: [id number] DOB:06-Dec-1955, 66 y.o., male Today's Date: 12/06/2021  PCP: Marland Kitchen REFERRING PROVIDER: ***    Past Medical History:  Diagnosis Date   Arthritis    Atrial fibrillation ([place])    Cancer ([place])    SCC base of tongue 2021   Cardiomyopathy    Cerebrovascular accident ([place])    2012   Diabetes mellitus    Hyperlipidemia    Hypertension    Left-sided weakness    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 ([place])    Dr. Zigmund Daniel, laser therapy OD   Seizures ([place])    pt reports this is from low blood sugar   Past Surgical History:  Procedure Laterality Date   COLONOSCOPY     DIRECT LARYNGOSCOPY N/A 02/10/2020   Procedure: DIRECT LARYNGOSCOPY WITH BIOPSY;  Surgeon: [name], MD;  Location: [facility] OR;  Service: ENT;  Laterality: N/A;   Ear surgery as a child     IR GASTROSTOMY TUBE MOD SED  03/23/2020   IR GASTROSTOMY TUBE REMOVAL  06/01/2020   IR IMAGING GUIDED PORT INSERTION  03/09/2020   IR REMOVAL TUN ACCESS W/ PORT W/O FL MOD SED  02/08/2021   TONSILLECTOMY     Patient Active Problem List   Diagnosis Date Noted   Chronic congestive heart failure with left ventricular diastolic dysfunction ([place]) 11/19/2021   History of CVA with residual deficit 11/03/2021   Dysphagia 11/03/2021   Fall at home, initial encounter 81/02/7508   Diastolic dysfunction 25/85/2778   Pancytopenia ([place]) 11/03/2021   Primary cancer of left lower lobe of lung ([place]) 06/13/2021   Normocytic normochromic anemia 03/20/2021   Stage 3b chronic kidney disease ([place]) 03/13/2021   Syncope and collapse 02/02/2021   Dilated cardiomyopathy ([place]) 02/02/2021   Weight loss, unintentional 05/26/2020   Insomnia 05/09/2020   CKD (chronic kidney disease) 05/09/2020   Chemotherapy induced diarrhea 05/09/2020   Malignant neoplasm of base of tongue ([place]) 03/02/2020   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  Rationale for Evaluation and Treatment {HABREHAB:27488}  SUBJECTIVE:   SUBJECTIVE STATEMENT: *** Pt accompanied by: {accompnied:27141}  PERTINENT HISTORY: ***  PAIN:  Are you having pain? {OPRCPAIN:27236}  FALLS: Has patient fallen in last 6 months?  {WERXVQMG:86761}  LIVING ENVIRONMENT: Lives with: {OPRC lives with:25569::"lives with their family"} Lives in: {Lives in:25570}  PLOF:  Level of assistance: {PJKDTOI:71245} Employment: {SLPemployment:25674}  PATIENT GOALS: ***  OBJECTIVE:   DIAGNOSTIC FINDINGS: ***  RECOMMENDATIONS FROM OBJECTIVE SWALLOW STUDY (MBSS/FEES):  Modified Barium Swallow Progress Note  Today's Date: 10/26/2021 Modified Barium Swallow   Brief recommendations include the following: Clinical Impression             Pt has a mild-moderate pharyngeal dysphagia suspected to be related to post-radiation changes with what appears to be edematous tissue, particularly noted at the epiglottis and arytenoids. He has reduced base of tongue retraction, hyolaryngeal excursion, epiglottic inversion, and laryngeal vestibule closure. Because he does not achieve full closure, all  liquids and even purees are penetrated in small amounts, but can be cleared well with a cued throat clear. Thin liquid penetrates reach the level of the true vocal folds and do fall below, with aspiration that is sensed as evidenced by a spontaneous throat clear (PAS 7). This throat clear does not adequately move aspirates, but when he is  cued to clear his throat harder, he ejects them from his airway completely. Other compensatory strategies, such as a supraglottic swallow or chin tuck, do not increase airway protection. Pt has increased residue in his valleculae with thicker liquids and foods, with residue most significant when given a bite of a cereal bar. This stickier texture left severe residue in his valleculae (almost the entire bolus), but he still maintained good control of it by bringing it back up into his mouth and trying to re-swallow it. He ultimately used a liquid wash to clear most of this residue. This happened to be the only time during the study that pt had any subjective c/o residue, and it was the most significant. Education was provided to pt with daughter also present. Given that penetration is likely with thicker liquids (and aspiration possible across a meal), there is increased residue, and pt has remained stable from a pulmonary standpoint, recommend continuing with thin liquids in the setting of thorough oral care and implementation of a cough or more effortful throat clear post-swallow. Could consider making foods a little softer (mechanical soft) to facilitate pharyngeal clearance and subjective symptoms. Given that pt is reporting an acute change in swallowing, recommend f/u with OP SLP. Swallow Evaluation Recommendations  SLP Diet Recommendations: Dysphagia 3 (Mech soft) solids;Thin liquid  Liquid Administration via: Cup;Straw  Medication Administration: Whole meds with puree  Supervision: Patient able to self feed  Compensations: Slow rate;Small sips/bites;Clear throat after each swallow;Hard cough after swallow  Postural Changes: Seated upright at 90 degrees  Oral Care Recommendations: Oral care BID  COGNITION: Overall cognitive status: Within functional limits for tasks assessed  ORAL MOTOR EXAMINATION: Overall status: Impaired: Labial: Left (ROM and Symmetry) Lingual: Left (Symmetry, Strength, and  Coordination) Facial: Left (Symmetry) Comments: lt facial droop   CLINICAL SWALLOW ASSESSMENT:   Current diet: Dysphagia 3 (mechanical soft), thin liquids, and pt notes that dense meats like chicken present challenges with pharyngeal clearance Dentition: missing dentition - pt with consult with oral surgery when  he makes the appointment Patient directly observed with POs: Yes: dysphagia 1 (puree) and thin liquids  Feeding: able to feed self Liquids provided by: cup Oral phase signs and symptoms:  none noted today Pharyngeal phase signs and symptoms: immediate throat clear, then SLP asked pt to clear throat (swallow precaution) and pt did so. Cue to reswallow necessary.  PATIENT REPORTED OUTCOME MEASURES (PROM): {SLPPROM:27095}  TODAY'S TREATMENT:    PATIENT EDUCATION: Education details: *** Person educated: {Person educated:25204} Education method: {Education Method:25205} Education comprehension: {Education Comprehension:25206}   ASSESSMENT:  CLINICAL IMPRESSION: Patient is a *** y.o. *** who was seen today for ***.   OBJECTIVE IMPAIRMENTS: include {SLPOBJIMP:27107}. These impairments are limiting patient from {SLPLIMIT:27108}. Factors affecting potential to achieve goals and functional outcome are {SLP potential:25450}. Patient will benefit from skilled SLP services to address above impairments and improve overall function.  REHAB POTENTIAL: {rehabpotential:25112}   GOALS: Goals reviewed with patient? {yes/no:20286}  SHORT TERM GOALS: Target date: {follow up:25551}  (Remove Blue Hyperlink)  *** Baseline: Goal status: {GOALSTATUS:25110}  2.  *** Baseline:  Goal status: {GOALSTATUS:25110}  3.  ***  Baseline:  Goal status: {GOALSTATUS:25110}  4.  *** Baseline:  Goal status: {GOALSTATUS:25110}  5.  *** Baseline:  Goal status: {GOALSTATUS:25110}  6.  *** Baseline:  Goal status: {GOALSTATUS:25110}  LONG TERM GOALS: Target date: {follow up:25551}  (Remove  Blue Hyperlink)  *** Baseline:  Goal status: {GOALSTATUS:25110}  2.  *** Baseline:  Goal status: {GOALSTATUS:25110}  3.  *** Baseline:  Goal status: {GOALSTATUS:25110}  4.  *** Baseline:  Goal status: {GOALSTATUS:25110}  5.  *** Baseline:  Goal status: {GOALSTATUS:25110}  6.  *** Baseline:  Goal status: {GOALSTATUS:25110}  PLAN: SLP FREQUENCY: {rehab frequency:25116}  SLP DURATION: {rehab duration:25117}  PLANNED INTERVENTIONS: {SLP treatment/interventions:25449}    Ivan Maskell, [name] 12/06/2021, 2:12 PM

## 2021-12-13 ENCOUNTER — Ambulatory Visit: Payer: Medicare HMO

## 2021-12-13 DIAGNOSIS — R1313 Dysphagia, pharyngeal phase: Secondary | ICD-10-CM

## 2021-12-13 NOTE — Therapy (Signed)
OUTPATIENT SPEECH LANGUAGE PATHOLOGY TREATMENT   Patient Name: Brian [name] MRN: [id number] DOB:1955/04/07, 66 y.o., male Today's Date: 12/13/2021  PCP: Jenna Luo, MD REFERRING PROVIDER: [name], MD    End of Session - 12/13/21 1405     Visit Number 2    Number of Visits 21    Date for SLP Re-Evaluation 02/15/22    SLP Start Time 72    SLP Stop Time  5956    SLP Time Calculation (min) 33 min    Activity Tolerance Patient tolerated treatment well              Past Medical History:  Diagnosis Date   Arthritis    Atrial fibrillation ([place])    Cancer ([place])    SCC base of tongue 2021   Cardiomyopathy    Cerebrovascular accident ([place])    2012   Diabetes mellitus    Hyperlipidemia    Hypertension    Left-sided weakness    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 Zigmund Daniel, laser therapy OD   Seizures ([place])    pt reports this is from low blood sugar   Past Surgical History:  Procedure Laterality Date   COLONOSCOPY     DIRECT LARYNGOSCOPY N/A 02/10/2020   Procedure: DIRECT LARYNGOSCOPY WITH BIOPSY;  Surgeon: [name], MD;  Location: [facility] OR;  Service: ENT;  Laterality: N/A;   Ear surgery as a child     IR GASTROSTOMY TUBE MOD SED  03/23/2020   IR GASTROSTOMY TUBE REMOVAL  06/01/2020   IR IMAGING GUIDED PORT INSERTION  03/09/2020   IR REMOVAL TUN ACCESS W/ PORT W/O FL MOD SED  02/08/2021   TONSILLECTOMY     Patient Active Problem List   Diagnosis Date Noted   Chronic congestive heart failure with left ventricular diastolic dysfunction ([place]) 11/19/2021   History of CVA with residual deficit 11/03/2021   Dysphagia 11/03/2021   Fall at home, initial encounter 38/75/6433   Diastolic dysfunction 29/51/8841   Pancytopenia ([place]) 11/03/2021   Primary cancer of left lower lobe of lung ([place]) 06/13/2021   Normocytic normochromic anemia 03/20/2021   Stage 3b chronic kidney disease ([place]) 03/13/2021   Syncope and collapse 02/02/2021   Dilated  cardiomyopathy ([place]) 02/02/2021   Weight loss, unintentional 05/26/2020   Insomnia 05/09/2020   CKD (chronic kidney disease) 05/09/2020   Chemotherapy induced diarrhea 05/09/2020   Malignant neoplasm of base of tongue ([place]) 03/02/2020   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 ([place])    Status epilepticus ([place]) 01/18/2013   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 status epilepticus, not intractable ([place]) 01/15/2013   Hyperlipidemia    Long term current use of anticoagulant 03/11/2010   Diabetes mellitus, type II ([place]) 12/20/2009   Essential hypertension, benign 12/20/2009   Atrial fibrillation ([place]) 66/07/3014   Systolic heart failure ([place]) 12/20/2009   CEREBROVASCULAR ACCIDENT 12/20/2009    ONSET DATE:  Winter 2022  REFERRING DIAG: C01 (ICD-10-CM) - Malignant neoplasm of base of tongue ([place])   THERAPY DIAG:  Dysphagia, pharyngeal phase  Rationale for Evaluation and Treatment Rehabilitation  SUBJECTIVE:   SUBJECTIVE STATEMENT: "I didn't do that (Masako) one."  Pt accompanied by: self and family member (son)  PERTINENT HISTORY: Pt seen for short course of ST in 2022 for prophylactic swallow exercises/therapy but pt did not schedule further ST after his 3rd visit. 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 Previous CVA 2011 which pt reportedly did not attend recommended OP therapies. CHF, CVA, Afib, CKD, DM  type 2.  PAIN:  Are you having pain? No  PATIENT GOALS: Swallow improvement  OBJECTIVE:   DIAGNOSTIC FINDINGS:  CT head 11-03-21 IMPRESSION: No acute intracranial abnormality. Chronic parietal lobe infarcts, right greater than left. Chronic left caudate infarct. Advanced chronic small vessel ischemic disease.  RECOMMENDATIONS FROM OBJECTIVE SWALLOW STUDY (MBSS/FEES):  Modified Barium Swallow Progress Note  Today's Date: 10/26/2021 Modified Barium Swallow   Brief recommendations include the following: Clinical Impression             Pt has a mild-moderate pharyngeal dysphagia suspected to be  related to post-radiation changes with what appears to be edematous tissue, particularly noted at the epiglottis and arytenoids. He has reduced base of tongue retraction, hyolaryngeal excursion, epiglottic inversion, and laryngeal vestibule closure. Because he does not achieve full closure, all liquids and even purees are penetrated in small amounts, but can be cleared well with a cued throat clear. Thin liquid penetrates reach the level of the true vocal folds and do fall below, with aspiration that is sensed as evidenced by a spontaneous throat clear (PAS 7). This throat clear does not adequately move aspirates, but when he is cued to clear his throat harder, he ejects them from his airway completely. Other compensatory strategies, such as a supraglottic swallow or chin tuck, do not increase airway protection. Pt has increased residue in his valleculae with thicker liquids and foods, with residue most significant when given a bite of a cereal bar. This stickier texture left severe residue in his valleculae (almost the entire bolus), but he still maintained good control of it by bringing it back up into his mouth and trying to re-swallow it. He ultimately used a liquid wash to clear most of this residue. This happened to be the only time during the study that pt had any subjective c/o residue, and it was the most significant. Education was provided to pt with daughter also present. Given that penetration is likely with thicker liquids (and aspiration possible across a meal), there is increased residue, and pt has remained stable from a pulmonary standpoint, recommend continuing with thin liquids in the setting of thorough oral care and implementation of a cough or more effortful throat clear post-swallow. Could consider making foods a little softer (mechanical soft) to facilitate pharyngeal clearance and subjective symptoms. Given that pt is reporting an acute change in swallowing, recommend f/u with OP  SLP. Swallow Evaluation Recommendations  SLP Diet Recommendations: Dysphagia 3 (Mech soft) solids;Thin liquid  Liquid Administration via: Cup;Straw  Medication Administration: Whole meds with puree  Supervision: Patient able to self feed  Compensations: Slow rate;Small sips/bites;Clear throat after each swallow;Hard cough after swallow  Postural Changes: Seated upright at 90 degrees  Oral Care Recommendations: Oral care BID   PATIENT REPORTED OUTCOME MEASURES (PROM): EAT-10: provided on 12/13/21.   TODAY'S TREATMENT:  12/13/21: Provided pt EAT-10 and he will return next session. Pt req's mod A faded to independence with effortful and MAsako, and SLP taught pt CTAR and Mendelsohn, which he req'd initial min-mod A faded to independence. Pt has completed effortful swallow but not Masako since last session but now knows he needs to complete all 4 exercises BID, and was explained rationale for this, as well as rationale for cough/throat clear and reswallow with EVERY bite or sip. "I'm taking smaller bites and sips for sure, buddy," pt told SLP.   12/06/21: SLP discussed the importance of performing HEP scope and frequency as prescribed, BID. SLP educated pt on the  procedure for HEP exercises.   PATIENT EDUCATION: Education details: See "today's treatment" Person educated: Patient and Child(ren) Education method: Explanation, Demonstration, Verbal cues, and Handouts Education comprehension: verbalized understanding, returned demonstration, verbal cues required, and needs further education   ASSESSMENT:  CLINICAL IMPRESSION: Patient is a 66 y.o. male who is seen for swallow treatment to perform HEP, and adhere to precautions after MBSS 10-26-21. Pt now has all 4 exercises for his HEP and told SLP he will complete as directed. SLP suggested lymphedema evaluation today to referring MD.  OBJECTIVE IMPAIRMENTS: include dysphagia. These impairments are limiting patient from safety when  swallowing. Factors affecting potential to achieve goals and functional outcome are cooperation/participation level and severity of impairments. Patient will benefit from skilled SLP services to address above impairments and improve overall function.  REHAB POTENTIAL: Fair due to severity and cooperation/participation.   GOALS: Goals reviewed with patient? Yes  SHORT TERM GOALS: Target date: 01/11/2022   Pt will follow precautions with POs in 3 sessions with rare min A Baseline: Goal status: ongoing  2.  Pt will complete HEP for swallowing with rare min A in 3 sessions Baseline:  Goal status: ongoing  3.  Pt will tell SLP 3 overt s/sx aspiration PNA with modified independence Baseline:  Goal status: ongoing   LONG TERM GOALS: Target date: 02/15/2022    Pt's EAT-10 score will improve by last day of therapy Baseline:  Goal status: ongoing  2.  Pt will follow precautions with POs in 3 sessions with modified independence (mod I) Baseline:  Goal status: ongoing  3.  Pt will complete HEP for swallowing with mod I in 3 sessions Baseline:  Goal status: ongoing  4.  Pt will undergo follow up modified barium swallow exam if/when he completes 8 weeks of HEP, at least 6 days/week Baseline:  Goal status: ongoing   PLAN: SLP FREQUENCY: 2x/week  SLP DURATION: 10 weeks  PLANNED INTERVENTIONS: Aspiration precaution training, Pharyngeal strengthening exercises, Diet toleration management , Environmental controls, Trials of upgraded texture/liquids, Internal/external aids, Oral motor exercises, SLP instruction and feedback, Compensatory strategies, Patient/family education, and Re-evaluation    [place], [name] 12/13/2021, 2:06 PM

## 2021-12-13 NOTE — Patient Instructions (Addendum)
    DO TWICE A DAY!  Effortful swallow: Push whole tongue up to the roof of your mouth for 1-2 seconds Swallow as hard as you can    10-15 times use one-two drops of water if your mouth gets dry   *Tongue-out Swallow (Masako Swallow) Put out your tongue and hold it with your teeth while you swallow 10-15 times    use one-two drops of water if your mouth gets dry  *Swallow and hold exercise Swallow and hold it for 5 seconds 10-15 times, twice a day  *Towel roll exercises Roll up a towel - 4 inches diameter - Put it under your chin and squeeze it down hard for 45 seconds  3 times twice a day - Put it back under your chin and squeeze down hard for 1 second - 10-15 times

## 2021-12-17 ENCOUNTER — Other Ambulatory Visit: Payer: Self-pay | Admitting: Physician Assistant

## 2021-12-19 ENCOUNTER — Ambulatory Visit: Payer: Self-pay | Admitting: *Deleted

## 2021-12-19 ENCOUNTER — Encounter: Payer: Self-pay | Admitting: *Deleted

## 2021-12-19 NOTE — Patient Outreach (Signed)
  Care Coordination   Follow Up Visit Note   12/19/2021  Name: AKIA DESROCHES MRN: [id number] DOB: [birth year]  [name] is a 66 y.o. year old male who sees Pickard, Cammie Mcgee, MD for primary care. I spoke with patient's son, Cleto Claggett by phone today.  What matters to the patients health and wellness today?  Receive Assistance Applying for [place] Medicaid and Placement into a [place].    Goals Addressed             This Visit's Progress    Receive Assistance Applying for [place] Medicaid and Placement into a [place].   On track    Care Coordination Interventions:  Emotional support provided. Caregiver stress acknowledged. Verbalization of feelings encouraged. Son agreed to apply for [place] Medicaid, through the [place].    Son agreed to apply for [place], through [facility]. Son agreed to begin touring long-term memory care assisted living facilities, rest homes and family care homes of interest. Son agreed to begin touring long-term memory care skilled nursing facilities of interest. Son agreed to schedule follow-up appointment for patient with Primary Care Provider, Dr. Jenna Luo for completion of FL-2 Form and determination of appropriate level of care. CSW collaboration with Primary Care Provider, Dr. Jenna Luo to request review and signature of FL-2 Form, for placement purposes.      SDOH assessments and interventions completed:  Yes.  Care Coordination Interventions Activated:  Yes.   Care Coordination Interventions:  Yes, provided.   Follow up plan: Follow up call scheduled for 01/02/2022 at 9:00 am.  Encounter Outcome:  Pt. Visit Completed.   Nat Christen, BSW, MSW, [name]  Licensed Education officer, environmental [facility]   Mailing [place] [address] [place], [place] [address] Physical Address-[address], [place], [place] [address] Toll Free Main # [phone] Fax # [phone] Cell # [phone] [email or web address]

## 2021-12-19 NOTE — Patient Instructions (Signed)
Visit Information  Thank you for taking time to visit with me today. Please don't hesitate to contact me if I can be of assistance to you.   Following are the goals we discussed today:   Goals Addressed             This Visit's Progress    Receive Assistance Applying for [place] Medicaid and Placement into a [place].   On track    Care Coordination Interventions:  Emotional support provided. Caregiver stress acknowledged. Verbalization of feelings encouraged. Son agreed to apply for [place] Medicaid, through the [place].    Son agreed to apply for [place], through [facility]. Son agreed to begin touring long-term memory care assisted living facilities, rest homes and family care homes of interest. Son agreed to begin touring long-term memory care skilled nursing facilities of interest. Son agreed to schedule follow-up appointment for patient with Primary Care Provider, Dr. Jenna Luo for completion of FL-2 Form and determination of appropriate level of care. CSW collaboration with Primary Care Provider, Dr. Jenna Luo to request review and signature of FL-2 Form, for placement purposes.      Our next appointment is by telephone on 01/02/2022 at 9:00 am.  Please call the care guide team at [phone] if you need to cancel or reschedule your appointment.   If you are experiencing a Mental Health or [place] or need someone to talk to, please call the Suicide and Crisis Lifeline: 988 call the [place] National Suicide Prevention Lifeline: 2083865688 or TTY: 276 143 1946 TTY 812-046-1591) to talk to a trained counselor call 1-800-273-TALK (toll free, 24 hour hotline) go to [facility] [address] [place] [phone]) call the [place]: [phone] call  911  Patient verbalizes understanding of instructions and care plan provided today and agrees to view in [place]. Active MyChart status and patient understanding of how to access instructions and care plan via MyChart confirmed with patient.     Telephone follow up appointment with care management team member scheduled for:  01/02/2022 at 9:00 am.  Nat Christen, BSW, MSW, [place]  Licensed Clinical Social Worker  [place]  Mailing [place]. [address] [place], [place] [address] Physical Address-[address], [place], [place] [address] Toll Free Main # [phone] Fax # [phone] Cell # [phone] [email or web address]

## 2021-12-21 ENCOUNTER — Ambulatory Visit: Payer: Medicare HMO | Attending: Radiation Oncology

## 2021-12-21 DIAGNOSIS — R1313 Dysphagia, pharyngeal phase: Secondary | ICD-10-CM | POA: Insufficient documentation

## 2021-12-22 ENCOUNTER — Other Ambulatory Visit: Payer: Self-pay

## 2021-12-22 DIAGNOSIS — C01 Malignant neoplasm of base of tongue: Secondary | ICD-10-CM

## 2021-12-22 NOTE — Therapy (Signed)
OUTPATIENT SPEECH LANGUAGE PATHOLOGY TREATMENT   Patient Name: Brian [name] MRN: [id number] DOB:February 25, 1955, 66 y.o., male Today's Date: 12/22/2021  PCP: Jenna Luo, MD REFERRING PROVIDER: [name], MD    End of Session - 12/22/21 1243     Visit Number 3    Number of Visits 21    Date for SLP Re-Evaluation 02/15/22    SLP Start Time 23    SLP Stop Time  1100    SLP Time Calculation (min) 39 min    Activity Tolerance Patient tolerated treatment well              Past Medical History:  Diagnosis Date   Arthritis    Atrial fibrillation ([place])    Cancer ([place])    SCC base of tongue 2021   Cardiomyopathy    Cerebrovascular accident ([place])    2012   Diabetes mellitus    Hyperlipidemia    Hypertension    Left-sided weakness    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 ([place])    Dr. Zigmund Daniel, laser therapy OD   Seizures ([place])    pt reports this is from low blood sugar   Past Surgical History:  Procedure Laterality Date   COLONOSCOPY     DIRECT LARYNGOSCOPY N/A 02/10/2020   Procedure: DIRECT LARYNGOSCOPY WITH BIOPSY;  Surgeon: [name], MD;  Location: [facility];  Service: ENT;  Laterality: N/A;   Ear surgery as a child     IR GASTROSTOMY TUBE MOD SED  03/23/2020   IR GASTROSTOMY TUBE REMOVAL  06/01/2020   IR IMAGING GUIDED PORT INSERTION  03/09/2020   IR REMOVAL TUN ACCESS W/ PORT W/O FL MOD SED  02/08/2021   TONSILLECTOMY     Patient Active Problem List   Diagnosis Date Noted   Chronic congestive heart failure with left ventricular diastolic dysfunction ([place]) 11/19/2021   History of CVA with residual deficit 11/03/2021   Dysphagia 11/03/2021   Fall at home, initial encounter 75/17/0017   Diastolic dysfunction 49/44/9675   Pancytopenia ([place]) 11/03/2021   Primary cancer of left lower lobe of lung ([place]) 06/13/2021   Normocytic normochromic anemia 03/20/2021   Stage 3b chronic kidney disease ([place]) 03/13/2021   Syncope and collapse 02/02/2021   Dilated  cardiomyopathy ([place]) 02/02/2021   Weight loss, unintentional 05/26/2020   Insomnia 05/09/2020   CKD (chronic kidney disease) 05/09/2020   Chemotherapy induced diarrhea 05/09/2020   Malignant neoplasm of base of tongue ([place]) 03/02/2020   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 ([place])    Status epilepticus ([place]) 01/18/2013   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 status epilepticus, not intractable ([place]) 01/15/2013   Hyperlipidemia    Long term current use of anticoagulant 03/11/2010   Diabetes mellitus, type II ([place]) 12/20/2009   Essential hypertension, benign 12/20/2009   Atrial fibrillation ([place]) 91/63/8466   Systolic heart failure ([place]) 12/20/2009   CEREBROVASCULAR ACCIDENT 12/20/2009    ONSET DATE:  Winter 2022  REFERRING DIAG: C01 (ICD-10-CM) - Malignant neoplasm of base of tongue ([place])   THERAPY DIAG:  Dysphagia, pharyngeal phase  Rationale for Evaluation and Treatment Rehabilitation  SUBJECTIVE:   SUBJECTIVE STATEMENT: "I didn't do that (Masako) one."  Pt accompanied by: self and family member (son)  PERTINENT HISTORY: Pt seen for short course of ST in 2022 for prophylactic swallow exercises/therapy but pt did not schedule further ST after his 3rd visit. 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 Previous CVA 2011 which pt reportedly did not attend recommended OP therapies. CHF, CVA, Afib, CKD, DM  type 2.  PAIN:  Are you having pain? No  PATIENT GOALS: Swallow improvement  OBJECTIVE:   DIAGNOSTIC FINDINGS:  CT head 11-03-21 IMPRESSION: No acute intracranial abnormality. Chronic parietal lobe infarcts, right greater than left. Chronic left caudate infarct. Advanced chronic small vessel ischemic disease.  RECOMMENDATIONS FROM OBJECTIVE SWALLOW STUDY (MBSS/FEES):  Modified Barium Swallow Progress Note  Today's Date: 10/26/2021 Modified Barium Swallow   Brief recommendations include the following: Clinical Impression             Pt has a mild-moderate pharyngeal dysphagia suspected to be  related to post-radiation changes with what appears to be edematous tissue, particularly noted at the epiglottis and arytenoids. He has reduced base of tongue retraction, hyolaryngeal excursion, epiglottic inversion, and laryngeal vestibule closure. Because he does not achieve full closure, all liquids and even purees are penetrated in small amounts, but can be cleared well with a cued throat clear. Thin liquid penetrates reach the level of the true vocal folds and do fall below, with aspiration that is sensed as evidenced by a spontaneous throat clear (PAS 7). This throat clear does not adequately move aspirates, but when he is cued to clear his throat harder, he ejects them from his airway completely. Other compensatory strategies, such as a supraglottic swallow or chin tuck, do not increase airway protection. Pt has increased residue in his valleculae with thicker liquids and foods, with residue most significant when given a bite of a cereal bar. This stickier texture left severe residue in his valleculae (almost the entire bolus), but he still maintained good control of it by bringing it back up into his mouth and trying to re-swallow it. He ultimately used a liquid wash to clear most of this residue. This happened to be the only time during the study that pt had any subjective c/o residue, and it was the most significant. Education was provided to pt with daughter also present. Given that penetration is likely with thicker liquids (and aspiration possible across a meal), there is increased residue, and pt has remained stable from a pulmonary standpoint, recommend continuing with thin liquids in the setting of thorough oral care and implementation of a cough or more effortful throat clear post-swallow. Could consider making foods a little softer (mechanical soft) to facilitate pharyngeal clearance and subjective symptoms. Given that pt is reporting an acute change in swallowing, recommend f/u with OP  SLP. Swallow Evaluation Recommendations  SLP Diet Recommendations: Dysphagia 3 (Mech soft) solids;Thin liquid  Liquid Administration via: Cup;Straw  Medication Administration: Whole meds with puree  Supervision: Patient able to self feed  Compensations: Slow rate;Small sips/bites;Clear throat after each swallow;Hard cough after swallow  Postural Changes: Seated upright at 90 degrees  Oral Care Recommendations: Oral care BID   PATIENT REPORTED OUTCOME MEASURES (PROM): EAT-10: provided on 12/13/21.   TODAY'S TREATMENT:  12/21/21: Pt cont'd to present with what appears to be lymphedema, and asks SLP if he notices pt's left side of face "is bigger than this (right) side?". SLP inquired with referring MD about PT referral.  SLP req'd to provide cues for pt to cough/throat clear and reswallow with POs. He politely refused cereal bar and SLP provided pt peanut butter crackers which he said he ate every 3-4 weeks. Pt took one bite and said it was difficult to achieve pharyngeal clearance and refused subsequent bites. SLP told pt that this PO type was not on his recommended diet and confirmed with him that items you can mash with your  fork would be recommended. Pt acknowledged these would be much easier for him to swallow.  He req'd min A rarely with HEP today. Told SLP he has been completing HEP "almost every day" except for CTAR due to not rolling up a towel yet. SLP strongly urged him to do this and provided guidance again on what 4" looked like.   12/13/21: Provided pt EAT-10 and he will return next session. Pt req's mod A faded to independence with effortful and MAsako, and SLP taught pt CTAR and Mendelsohn, which he req'd initial min-mod A faded to independence. Pt has completed effortful swallow but not Masako since last session but now knows he needs to complete all 4 exercises BID, and was explained rationale for this, as well as rationale for cough/throat clear and reswallow with EVERY bite or  sip. "I'm taking smaller bites and sips for sure, buddy," pt told SLP.   12/06/21: SLP discussed the importance of performing HEP scope and frequency as prescribed, BID. SLP educated pt on the procedure for HEP exercises.   PATIENT EDUCATION: Education details: See "today's treatment"    Person educated: Patient and Child(ren) Education method: Explanation, Demonstration, Verbal cues, and Handouts Education comprehension: verbalized understanding, returned demonstration, verbal cues required, and needs further education   ASSESSMENT:  CLINICAL IMPRESSION: Patient is a 66 y.o. male who is seen for swallow treatment to perform HEP, and adhere to precautions after MBSS 10-26-21. Pt now has all 4 exercises for his HEP and told SLP he will complete as directed. SLP suggested lymphedema evaluation today to referring MD.  OBJECTIVE IMPAIRMENTS: include dysphagia. These impairments are limiting patient from safety when swallowing. Factors affecting potential to achieve goals and functional outcome are cooperation/participation level and severity of impairments. Patient will benefit from skilled SLP services to address above impairments and improve overall function.  REHAB POTENTIAL: Fair due to severity and cooperation/participation.   GOALS: Goals reviewed with patient? Yes  SHORT TERM GOALS: Target date: 01/11/2022   Pt will follow precautions with POs in 3 sessions with rare min A Baseline: Goal status: ongoing  2.  Pt will complete HEP for swallowing with rare min A in 3 sessions Baseline:  Goal status: ongoing  3.  Pt will tell SLP 3 overt s/sx aspiration PNA with modified independence Baseline:  Goal status: ongoing   LONG TERM GOALS: Target date: 02/15/2022    Pt's EAT-10 score will improve by last day of therapy Baseline:  Goal status: ongoing  2.  Pt will follow precautions with POs in 3 sessions with modified independence (mod I) Baseline:  Goal status: ongoing  3.   Pt will complete HEP for swallowing with mod I in 3 sessions Baseline:  Goal status: ongoing  4.  Pt will undergo follow up modified barium swallow exam if/when he completes 8 weeks of HEP, at least 6 days/week Baseline:  Goal status: ongoing   PLAN: SLP FREQUENCY: 2x/week  SLP DURATION: 10 weeks  PLANNED INTERVENTIONS: Aspiration precaution training, Pharyngeal strengthening exercises, Diet toleration management , Environmental controls, Trials of upgraded texture/liquids, Internal/external aids, Oral motor exercises, SLP instruction and feedback, Compensatory strategies, Patient/family education, and Re-evaluation    [place], [name] 12/22/2021, 12:44 PM

## 2021-12-25 DIAGNOSIS — R1312 Dysphagia, oropharyngeal phase: Secondary | ICD-10-CM | POA: Diagnosis not present

## 2021-12-25 DIAGNOSIS — H903 Sensorineural hearing loss, bilateral: Secondary | ICD-10-CM | POA: Diagnosis not present

## 2021-12-25 DIAGNOSIS — Z85819 Personal history of malignant neoplasm of unspecified site of lip, oral cavity, and pharynx: Secondary | ICD-10-CM | POA: Diagnosis not present

## 2021-12-25 DIAGNOSIS — R070 Pain in throat: Secondary | ICD-10-CM | POA: Diagnosis not present

## 2021-12-26 ENCOUNTER — Ambulatory Visit (INDEPENDENT_AMBULATORY_CARE_PROVIDER_SITE_OTHER): Payer: Medicare HMO | Admitting: Family Medicine

## 2021-12-26 ENCOUNTER — Ambulatory Visit: Payer: Medicare HMO | Admitting: Family Medicine

## 2021-12-26 VITALS — BP 172/110 | HR 116 | Ht 69.0 in | Wt 175.4 lb

## 2021-12-26 DIAGNOSIS — I4891 Unspecified atrial fibrillation: Secondary | ICD-10-CM

## 2021-12-26 DIAGNOSIS — I10 Essential (primary) hypertension: Secondary | ICD-10-CM

## 2021-12-26 LAB — PT WITH INR/FINGERSTICK
INR, fingerstick: 7.6 ratio — ABNORMAL HIGH
PT, fingerstick: 91.4 s — ABNORMAL HIGH (ref 10.5–13.1)

## 2021-12-26 MED ORDER — DOXAZOSIN MESYLATE 2 MG PO TABS: 2.0000 mg | ORAL_TABLET | Freq: Every day | ORAL | 3 refills | Status: DC

## 2021-12-26 MED ORDER — APIXABAN 5 MG PO TABS: 5.0000 mg | ORAL_TABLET | Freq: Two times a day (BID) | ORAL | 5 refills | Status: DC

## 2021-12-26 NOTE — Progress Notes (Signed)
Subjective:    Patient ID: Brian [name], male    DOB: 02-28-1955, 66 y.o.   MRN: [id number]  HPI Patient is here today with his son.  Patient is falling at home.  The most recent fall, he was lying in the floor for several hours prior to being discovered.  Also there is question about him administering his medication.  He states that he is taking all his medication and has not changed anything however his INR today is supratherapeutic at greater than 7 making me suspect that he may be taking his Coumadin incorrectly.  He is not checking his blood pressure regularly at home.  He states that he has not taken his blood pressure medicine this morning but today his blood pressure is 172/110.  He denies any chest pain or shortness of breath or dyspnea.  He denies any headaches.  He denies any strokelike symptoms.  He denies any bleeding or bruising Past Medical History:  Diagnosis Date   Arthritis    Atrial fibrillation ([place])    Cancer ([place])    SCC base of tongue 2021   Cardiomyopathy    Cerebrovascular accident [facility])    2012   Diabetes mellitus    Hyperlipidemia    Hypertension    Left-sided weakness    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 ([place])    Dr. Zigmund Daniel, laser therapy OD   Seizures ([place])    pt reports this is from low blood sugar   Past Surgical History:  Procedure Laterality Date   COLONOSCOPY     DIRECT LARYNGOSCOPY N/A 02/10/2020   Procedure: DIRECT LARYNGOSCOPY WITH BIOPSY;  Surgeon: [name], MD;  Location: [facility];  Service: ENT;  Laterality: N/A;   Ear surgery as a child     IR GASTROSTOMY TUBE MOD SED  03/23/2020   IR GASTROSTOMY TUBE REMOVAL  06/01/2020   IR IMAGING GUIDED PORT INSERTION  03/09/2020   IR REMOVAL TUN ACCESS W/ PORT W/O FL MOD SED  02/08/2021   TONSILLECTOMY     Social History   Socioeconomic History   Marital status: Divorced    Spouse name: Not on file   Number of children: 2   Years of education: 12   Highest education level: 12th grade   Occupational History   Occupation: Disabled  Tobacco Use   Smoking status: Never    Passive exposure: Never   Smokeless tobacco: Never   Tobacco comments:    Verified by son, Delmo Matty  Vaping Use   Vaping Use: Never used  Substance and Sexual Activity   Alcohol use: Yes    Alcohol/week: 0.0 standard drinks of alcohol    Comment: seldom   Drug use: Yes    Frequency: 7.0 times per week    Comment: marijauna for arthritis, last 03-13-20   Sexual activity: Not Currently    Partners: Female  Other Topics Concern   Not on file  Social History Narrative   Two children.  5 grandchildren.     Social Determinants of Health   Financial Resource Strain: Low Risk  (11/14/2021)   Overall Financial Resource Strain (CARDIA)    Difficulty of Paying Living Expenses: Not very hard  Food Insecurity: No Food Insecurity (11/14/2021)   Hunger Vital Sign    Worried About Running Out of Food in the Last Year: Never true    Ran Out of Food in the Last Year: Never true  Transportation Needs: No Transportation Needs (11/14/2021)   PRAPARE -  Hydrologist (Medical): No    Lack of Transportation (Non-Medical): No  Physical Activity: Sufficiently Active (11/14/2021)   Exercise Vital Sign    Days of Exercise per Week: 5 days    Minutes of Exercise per Session: 30 min  Stress: No Stress Concern Present (11/14/2021)   [place]    Feeling of Stress : Not at all  Social Connections: Socially Isolated (11/14/2021)   Social Connection and Isolation Panel [NHANES]    Frequency of Communication with Friends and Family: More than three times a week    Frequency of Social Gatherings with Friends and Family: More than three times a week    Attends Religious Services: Never    Marine scientist or Organizations: No    Attends Archivist Meetings: Never    Marital Status: Divorced  Human resources officer  Violence: Not At Risk (11/14/2021)   Humiliation, Afraid, Rape, and Kick questionnaire    Fear of Current or Ex-Partner: No    Emotionally Abused: No    Physically Abused: No    Sexually Abused: No   Current Outpatient Medications on File Prior to Visit  Medication Sig Dispense Refill   atorvastatin (LIPITOR) 20 MG tablet TAKE 1 TABLET BY MOUTH EVERY DAY 90 tablet 3   Blood Glucose Monitoring Suppl (ONETOUCH VERIO IQ SYSTEM) w/Device KIT 1 each by Does not apply route 2 (two) times daily. 1 kit 0   carvedilol (COREG) 25 MG tablet Take 1 tablet (25 mg total) by mouth 2 (two) times daily. 60 tablet 2   Continuous Blood Gluc Sensor (FREESTYLE LIBRE 2 SENSOR) MISC Use as directed to monitor blood glucose continuously. Change sensor Q 14 days. DX E11.65 2 each 11   diltiazem (CARDIZEM CD) 120 MG 24 hr capsule Take 2 capsules (240 mg total) by mouth daily. 60 capsule 5   diltiazem (CARDIZEM LA) 240 MG 24 hr tablet Take 1 tablet (240 mg total) by mouth daily. 90 tablet 3   ferrous sulfate 325 (65 FE) MG EC tablet Take 325 mg by mouth 3 (three) times daily with meals.     furosemide (LASIX) 40 MG tablet TAKE 1 TABLET (40 MG TOTAL) BY MOUTH AS NEEDED FOR FLUID OR EDEMA. 90 tablet 3   glucose blood test strip 1 each by Other route as needed for other. Use as instructed 100 each 11   hydrALAZINE (APRESOLINE) 50 MG tablet TAKE 1 TABLET BY MOUTH THREE TIMES A DAY 270 tablet 1   JARDIANCE 25 MG TABS tablet TAKE 1 TABLET (25 MG TOTAL) BY MOUTH DAILY. 14 tablet 25   losartan (COZAAR) 100 MG tablet TAKE 1 TABLET BY MOUTH EVERY DAY 90 tablet 3   pioglitazone (ACTOS) 30 MG tablet Take 30 mg by mouth daily.     warfarin (COUMADIN) 10 MG tablet Take 0.5-1 tablets (5-10 mg total) by mouth daily. Pt takes 5 mg Mon-Wed-Fri-Sat and 10 mg Tues-Thurs-Sun     No current facility-administered medications on file prior to visit.   Allergies  Allergen Reactions   Codeine Other (See Comments)    Unknown reaction, severe  headach   Metformin And Related Diarrhea     Review of Systems  All other systems reviewed and are negative.      Objective:   Physical Exam Vitals reviewed.  Constitutional:      Appearance: Normal appearance. He is normal weight.  Cardiovascular:  Rate and Rhythm: Normal rate. Rhythm irregular.     Heart sounds: Normal heart sounds.  Pulmonary:     Effort: Pulmonary effort is normal.     Breath sounds: Normal breath sounds. No rales.  Neurological:     Mental Status: He is alert.           Assessment & Plan:  Atrial fibrillation, unspecified type ([place]) - Plan: PT with INR/Fingerstick  Essential hypertension, benign INR today is dangerous.  Discontinue Coumadin immediately.  Recheck INR on Friday.  I discussed with the patient and his son that I feel Eliquis to be a safer option for him because I am concerned about him taking his medication appropriately.  I will start the patient on Eliquis on Friday if it is affordable for the patient and if his INR is back into a therapeutic range.  I will add doxazosin 2 mg a day to his other antihypertensives and recheck his blood pressure Friday.  I believe the patient will benefit from skilled nursing facility due to his recent falls primarily for medication supervision as well as to assist in his activities of daily living.  The patient is no longer able to cook and clean for himself.  Therefore I would recommend a nursing home for nurse to administer medication for the patient as well as for physical therapy.  Family will start looking into local nursing homes.  Once they have found a facility that they approve above, I will complete [place] 2 and fax it to that facility.  Recheck here on Friday his INR and his blood pressure

## 2021-12-27 ENCOUNTER — Other Ambulatory Visit: Payer: Self-pay | Admitting: Family Medicine

## 2021-12-27 ENCOUNTER — Ambulatory Visit: Payer: Medicare HMO

## 2021-12-27 DIAGNOSIS — R1313 Dysphagia, pharyngeal phase: Secondary | ICD-10-CM | POA: Diagnosis not present

## 2021-12-27 NOTE — Therapy (Signed)
OUTPATIENT SPEECH LANGUAGE PATHOLOGY TREATMENT   Patient Name: Brian [name] MRN: [id number] DOB:February 28, 1955, 66 y.o., male Today's Date: 12/27/2021  PCP: Jenna Luo, MD REFERRING PROVIDER: [name], MD    End of Session - 12/27/21 1420     Visit Number 4    Number of Visits 21    Date for SLP Re-Evaluation 02/15/22    SLP Start Time 27    SLP Stop Time  1350    SLP Time Calculation (min) 30 min    Activity Tolerance Patient tolerated treatment well               Past Medical History:  Diagnosis Date   Arthritis    Atrial fibrillation ([place])    Cancer ([place])    SCC base of tongue 2021   Cardiomyopathy    Cerebrovascular accident ([place])    2012   Diabetes mellitus    Hyperlipidemia    Hypertension    Left-sided weakness    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 ([place])    Dr. Zigmund Daniel, laser therapy OD   Seizures ([place])    pt reports this is from low blood sugar   Past Surgical History:  Procedure Laterality Date   COLONOSCOPY     DIRECT LARYNGOSCOPY N/A 02/10/2020   Procedure: DIRECT LARYNGOSCOPY WITH BIOPSY;  Surgeon: [name], MD;  Location: [facility];  Service: ENT;  Laterality: N/A;   Ear surgery as a child     IR GASTROSTOMY TUBE MOD SED  03/23/2020   IR GASTROSTOMY TUBE REMOVAL  06/01/2020   IR IMAGING GUIDED PORT INSERTION  03/09/2020   IR REMOVAL TUN ACCESS W/ PORT W/O FL MOD SED  02/08/2021   TONSILLECTOMY     Patient Active Problem List   Diagnosis Date Noted   Chronic congestive heart failure with left ventricular diastolic dysfunction ([place]) 11/19/2021   History of CVA with residual deficit 11/03/2021   Dysphagia 11/03/2021   Fall at home, initial encounter 42/70/6237   Diastolic dysfunction 62/83/1517   Pancytopenia ([place]) 11/03/2021   Primary cancer of left lower lobe of lung ([place]) 06/13/2021   Normocytic normochromic anemia 03/20/2021   Stage 3b chronic kidney disease ([place]) 03/13/2021   Syncope and collapse 02/02/2021   Dilated  cardiomyopathy ([place]) 02/02/2021   Weight loss, unintentional 05/26/2020   Insomnia 05/09/2020   CKD (chronic kidney disease) 05/09/2020   Chemotherapy induced diarrhea 05/09/2020   Malignant neoplasm of base of tongue ([place]) 03/02/2020   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 (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 status epilepticus, not intractable ([place]) 01/15/2013   Hyperlipidemia    Long term current use of anticoagulant 03/11/2010   Diabetes mellitus, type II ([place]) 12/20/2009   Essential hypertension, benign 12/20/2009   Atrial fibrillation ([place]) 61/60/7371   Systolic heart failure ([place]) 12/20/2009   CEREBROVASCULAR ACCIDENT 12/20/2009    ONSET DATE:  Winter 2022  REFERRING DIAG: C01 (ICD-10-CM) - Malignant neoplasm of base of tongue ([place])   THERAPY DIAG:  Dysphagia, pharyngeal phase  Rationale for Evaluation and Treatment Rehabilitation  SUBJECTIVE:   SUBJECTIVE STATEMENT: "I didn't do that (Masako) one."  Pt accompanied by: self and family member (son)  PERTINENT HISTORY: Pt seen for short course of ST in 2022 for prophylactic swallow exercises/therapy but pt did not schedule further ST after his 3rd visit. 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 Previous CVA 2011 which pt reportedly did not attend recommended OP therapies. CHF, CVA, Afib, CKD,  DM type 2.  PAIN:  Are you having pain? No  PATIENT GOALS: Swallow improvement  OBJECTIVE:   DIAGNOSTIC FINDINGS:  CT head 11-03-21 IMPRESSION: No acute intracranial abnormality. Chronic parietal lobe infarcts, right greater than left. Chronic left caudate infarct. Advanced chronic small vessel ischemic disease.  RECOMMENDATIONS FROM OBJECTIVE SWALLOW STUDY (MBSS/FEES):  Modified Barium Swallow Progress Note  Today's Date: 10/26/2021 Modified Barium Swallow   Brief recommendations include the following: Clinical Impression             Pt has a mild-moderate pharyngeal dysphagia suspected to be  related to post-radiation changes with what appears to be edematous tissue, particularly noted at the epiglottis and arytenoids. He has reduced base of tongue retraction, hyolaryngeal excursion, epiglottic inversion, and laryngeal vestibule closure. Because he does not achieve full closure, all liquids and even purees are penetrated in small amounts, but can be cleared well with a cued throat clear. Thin liquid penetrates reach the level of the true vocal folds and do fall below, with aspiration that is sensed as evidenced by a spontaneous throat clear (PAS 7). This throat clear does not adequately move aspirates, but when he is cued to clear his throat harder, he ejects them from his airway completely. Other compensatory strategies, such as a supraglottic swallow or chin tuck, do not increase airway protection. Pt has increased residue in his valleculae with thicker liquids and foods, with residue most significant when given a bite of a cereal bar. This stickier texture left severe residue in his valleculae (almost the entire bolus), but he still maintained good control of it by bringing it back up into his mouth and trying to re-swallow it. He ultimately used a liquid wash to clear most of this residue. This happened to be the only time during the study that pt had any subjective c/o residue, and it was the most significant. Education was provided to pt with daughter also present. Given that penetration is likely with thicker liquids (and aspiration possible across a meal), there is increased residue, and pt has remained stable from a pulmonary standpoint, recommend continuing with thin liquids in the setting of thorough oral care and implementation of a cough or more effortful throat clear post-swallow. Could consider making foods a little softer (mechanical soft) to facilitate pharyngeal clearance and subjective symptoms. Given that pt is reporting an acute change in swallowing, recommend f/u with OP  SLP. Swallow Evaluation Recommendations  SLP Diet Recommendations: Dysphagia 3 (Mech soft) solids;Thin liquid  Liquid Administration via: Cup;Straw  Medication Administration: Whole meds with puree  Supervision: Patient able to self feed  Compensations: Slow rate;Small sips/bites;Clear throat after each swallow;Hard cough after swallow  Postural Changes: Seated upright at 90 degrees  Oral Care Recommendations: Oral care BID   PATIENT REPORTED OUTCOME MEASURES (PROM): EAT-10: provided on 12/13/21.   TODAY'S TREATMENT:  12-27-21: Pt states he has done HEP twice, maybe three or four times a day - "depends on what I'm doing that day." SLP reiterated goal to pt to have 20-30 reps done of each exercise/day. He acknowledged understanding. Procedure for HEP is WNL. He followed dysphagia precautions with POs with initial reminder but did not require any more cues after this. Pt stated he was performing cough or throat clear and re-swallow with POs.  Pt has lymphedema eval 01-04-22. SLP stated if pt success continued he could reduce frequency starting next week.   12/21/21: Pt cont'd to present with what appears to be lymphedema, and asks SLP if  he notices pt's left side of face "is bigger than this (right) side?". SLP inquired with referring MD about PT referral.  SLP req'd to provide cues for pt to cough/throat clear and reswallow with POs. He politely refused cereal bar and SLP provided pt peanut butter crackers which he said he ate every 3-4 weeks. Pt took one bite and said it was difficult to achieve pharyngeal clearance and refused subsequent bites. SLP told pt that this PO type was not on his recommended diet and confirmed with him that items you can mash with your fork would be recommended. Pt acknowledged these would be much easier for him to swallow.  He req'd min A rarely with HEP today. Told SLP he has been completing HEP "almost every day" except for CTAR due to not rolling up a towel yet. SLP  strongly urged him to do this and provided guidance again on what 4" looked like.   12/13/21: Provided pt EAT-10 and he will return next session. Pt req's mod A faded to independence with effortful and MAsako, and SLP taught pt CTAR and Mendelsohn, which he req'd initial min-mod A faded to independence. Pt has completed effortful swallow but not Masako since last session but now knows he needs to complete all 4 exercises BID, and was explained rationale for this, as well as rationale for cough/throat clear and reswallow with EVERY bite or sip. "I'm taking smaller bites and sips for sure, buddy," pt told SLP.   12/06/21: SLP discussed the importance of performing HEP scope and frequency as prescribed, BID. SLP educated pt on the procedure for HEP exercises.   PATIENT EDUCATION: Education details: See "today's treatment"    Person educated: Patient and Child(ren) Education method: Explanation, Demonstration, Verbal cues, and Handouts Education comprehension: verbalized understanding, returned demonstration, verbal cues required, and needs further education   ASSESSMENT:  CLINICAL IMPRESSION: Patient is a 66 y.o. male who is seen for swallow treatment to perform HEP, and adhere to precautions after MBSS 10-26-21. Pt now has all 4 exercises for his HEP and told SLP he will complete as directed. If pt success today (12-27-21) contiues he can decr frequency.  OBJECTIVE IMPAIRMENTS: include dysphagia. These impairments are limiting patient from safety when swallowing. Factors affecting potential to achieve goals and functional outcome are cooperation/participation level and severity of impairments. Patient will benefit from skilled SLP services to address above impairments and improve overall function.  REHAB POTENTIAL: Fair due to severity and cooperation/participation.   GOALS: Goals reviewed with patient? Yes  SHORT TERM GOALS: Target date: 01/11/2022   Pt will follow precautions with POs in 3  sessions with rare min A Baseline:12-27-21 Goal status: ongoing  2.  Pt will complete HEP for swallowing with rare min A in 3 sessions Baseline: 12-27-21 Goal status: ongoing  3.  Pt will tell SLP 3 overt s/sx aspiration PNA with modified independence Baseline:  Goal status: ongoing   LONG TERM GOALS: Target date: 02/15/2022    Pt's EAT-10 score will improve by last day of therapy Baseline:  Goal status: ongoing  2.  Pt will follow precautions with POs in 3 sessions with modified independence (mod I) Baseline:  Goal status: ongoing  3.  Pt will complete HEP for swallowing with mod I in 3 sessions Baseline:  Goal status: ongoing  4.  Pt will undergo follow up modified barium swallow exam if/when he completes 8 weeks of HEP, at least 6 days/week Baseline:  Goal status: ongoing   PLAN: SLP  FREQUENCY: 2x/week  SLP DURATION: 10 weeks  PLANNED INTERVENTIONS: Aspiration precaution training, Pharyngeal strengthening exercises, Diet toleration management , Environmental controls, Trials of upgraded texture/liquids, Internal/external aids, Oral motor exercises, SLP instruction and feedback, Compensatory strategies, Patient/family education, and Re-evaluation    [place], [name] 12/27/2021, 2:21 PM

## 2021-12-27 NOTE — Telephone Encounter (Signed)
Requested by interface surescripts.  Discontinued medication 0/38/88 due to duplicate . Requested Prescriptions  Refused Prescriptions Disp Refills   diltiazem (CARDIZEM CD) 240 MG 24 hr capsule [Pharmacy Med Name: DILTIAZEM 24H ER(CD) 240 MG CP] 30 capsule 0    Sig: TAKE 1 CAPSULE BY MOUTH EVERY DAY     Cardiovascular: Calcium Channel Blockers 3 Failed - 12/27/2021 12:20 PM      Failed - Cr in normal range and within 360 days    Creat  Date Value Ref Range Status  06/01/2021 2.19 (H) 0.70 - 1.35 mg/dL Final   Creatinine, Ser  Date Value Ref Range Status  11/09/2021 1.87 (H) 0.61 - 1.24 mg/dL Final   Creatinine, Urine  Date Value Ref Range Status  12/12/2009 226.8 mg/dL Final         Failed - Last BP in normal range    BP Readings from Last 1 Encounters:  12/26/21 (!) 172/110         Failed - Last Heart Rate in normal range    Pulse Readings from Last 1 Encounters:  12/26/21 (!) 116         Passed - ALT in normal range and within 360 days    ALT  Date Value Ref Range Status  11/03/2021 12 0 - 44 U/L Final  03/20/2021 13 0 - 44 U/L Final         Passed - AST in normal range and within 360 days    AST  Date Value Ref Range Status  11/03/2021 16 15 - 41 U/L Final  03/20/2021 14 (L) 15 - 41 U/L Final         Passed - Valid encounter within last 6 months    Recent Outpatient Visits           5 months ago Atrial fibrillation, unspecified type ([place])   [place] [name], MD   6 months ago Atrial fibrillation, unspecified type ([place])   [place] [name], MD   8 months ago Atrial fibrillation, unspecified type [facility])   [place] [name], MD   9 months ago Atrial fibrillation, unspecified type [facility])   [place] Pickard, Cammie Mcgee, MD   10 months ago Atrial fibrillation, unspecified type [facility])   [facility] Pickard, Cammie Mcgee, MD

## 2021-12-29 ENCOUNTER — Other Ambulatory Visit: Payer: Medicare HMO

## 2021-12-29 ENCOUNTER — Telehealth: Payer: Self-pay

## 2021-12-29 ENCOUNTER — Other Ambulatory Visit: Payer: Self-pay

## 2021-12-29 DIAGNOSIS — Z7901 Long term (current) use of anticoagulants: Secondary | ICD-10-CM | POA: Diagnosis not present

## 2021-12-29 LAB — PT WITH INR/FINGERSTICK
INR, fingerstick: 2 ratio — ABNORMAL HIGH
PT, fingerstick: 24.3 s — ABNORMAL HIGH (ref 10.5–13.1)

## 2021-12-29 NOTE — Progress Notes (Signed)
Pt came in for lab to get his INR check. Per pt wanted to get his bp recheck per Dr. Dennard Schaumann, reading was 140/80.

## 2021-12-29 NOTE — Telephone Encounter (Signed)
FYI  Pt came in clinic for lab work and wanted to get his bp recheck per provider request.   B/P was 140/80. Give pt a BP log for pt to keep track of it for a few days. Pt voiced understanding and nothing further.

## 2022-01-02 ENCOUNTER — Ambulatory Visit: Payer: Self-pay | Admitting: *Deleted

## 2022-01-02 ENCOUNTER — Encounter: Payer: Self-pay | Admitting: *Deleted

## 2022-01-02 NOTE — Patient Outreach (Signed)
  Care Coordination   Follow Up Visit Note   01/02/2022  Name: Brian [name] MRN: [id number] DOB: April 13, 1955  Brian [name] is a 66 y.o. year old male who sees Pickard, Cammie Mcgee, MD for primary care. I spoke with patient's son, Tyrek Lawhorn by phone today.  What matters to the patients health and wellness today?   Receive Assistance Applying for [place] Medicaid and Placement into a [place].   Goals Addressed             This Visit's Progress    Receive Assistance Applying for [place] Medicaid and Placement into a [place].   On track    Care Coordination Interventions:  Verbalization of feelings encouraged. Caregiver stress acknowledged. Caregiver support provided. Caregiver resources reviewed. Son applied for [place] Medicaid, through the [place].   ~ Special Assistance Long-Term Care Medicaid Application submitted and currently being processed. ~ Await approval/denial letter from the [place] regarding Chief Operating Officer for [place] Medicaid.  Son applied for Adult Medicaid, through the [place]. ~ Adult Medicaid Application submitted and currently being processed. ~ Await approval/denial letter from the [place]. Son agreed to apply for [place], through [facility]. ~ Await [place] application completion and submission to [facility], until approved for Adult Medicaid coverage. Son agreed to continue touring long-term memory care assisted living facilities, rest homes and family care homes of interest. ~ List of long-term memory care assisted living facilities, rest homes and family care homes provided.       SDOH assessments and  interventions completed:  Yes.   Care Coordination Interventions Activated:  Yes.   Care Coordination Interventions:  Yes, provided.   Follow up plan: Follow up call scheduled for 01/16/2022 at 9:00 am.   Encounter Outcome:  Pt. Visit Completed.   Nat Christen, BSW, MSW, [name]  Licensed Education officer, environmental [facility] System  Mailing [place] [address] [place], [place] [address] Physical Address-[address] [place], [place] [address] Toll Free Main # [phone] Fax # [phone] Cell # [phone] [email or web address]

## 2022-01-02 NOTE — Patient Instructions (Signed)
Visit Information  Thank you for taking time to visit with me today. Please don't hesitate to contact me if I can be of assistance to you.   Following are the goals we discussed today:   Goals Addressed             This Visit's Progress    Receive Assistance Applying for [place] Medicaid and Placement into a [place].   On track    Care Coordination Interventions:  Verbalization of feelings encouraged. Caregiver stress acknowledged. Caregiver support provided. Caregiver resources reviewed. Son applied for [place] Medicaid, through the [place].   ~ Special Assistance Long-Term Care Medicaid Application submitted and currently being processed. ~ Await approval/denial letter from the [place] regarding Chief Operating Officer for [place] Medicaid.  Son applied for Adult Medicaid, through the [place]. ~ Adult Medicaid Application submitted and currently being processed. ~ Await approval/denial letter from the [place]. Son agreed to apply for [place], through [facility]. ~ Await [place] application completion and submission to [facility], until approved for Adult Medicaid coverage. Son agreed to continue touring long-term memory care assisted living facilities, rest homes and family care homes of interest. ~ List of long-term memory care assisted living facilities, rest homes and family care homes provided.       Our next appointment is by telephone on 01/16/2022 at 9:00 am.  Please call the care guide team at [phone] if you need to cancel or reschedule your appointment.   If you are experiencing a Mental Health or [place] or need someone to talk to, please call the Suicide  and Crisis Lifeline: 988 call the [place] National Suicide Prevention Lifeline: 941-321-6456 or TTY: 223-527-1276 TTY 408-651-9548) to talk to a trained counselor call 1-800-273-TALK (toll free, 24 hour hotline) go to [facility] [address] [place] [phone]) call the [place]: [phone] call 911  Patient verbalizes understanding of instructions and care plan provided today and agrees to view in [place]. Active MyChart status and patient understanding of how to access instructions and care plan via MyChart confirmed with patient.     Telephone follow up appointment with care management team member scheduled for:  01/16/2022 at 9:00 am.  Nat Christen, BSW, MSW, [place]  Licensed Clinical Social Worker  [place]  Mailing [place]. [address], [place], [place] [address] Physical Address-[address], [place], [place] [address] Toll Free Main # [phone] Fax # [phone] Cell # [phone] [email or web address]

## 2022-01-03 ENCOUNTER — Telehealth: Payer: Self-pay | Admitting: Pharmacist

## 2022-01-03 ENCOUNTER — Ambulatory Visit: Payer: Medicare HMO

## 2022-01-03 NOTE — Progress Notes (Signed)
Chronic Care Management Pharmacy Assistant   Name: Brian [name]  MRN: [id number] DOB: 1955/10/14   Reason for Encounter: Disease State - Diabetes Call      Recent office visits:  12/26/21 Jenna Luo MD - Family Medicine - Afib - Labs were ordered.  apixaban (ELIQUIS) 5 MG TABS tablet  and doxazosin (CARDURA) 2 MG tablet  prescribed.    Recent consult visits:  01/02/22 Referral placed to long term skilled nursing facility.  Hospital visits:  Medication Reconciliation was completed by comparing discharge summary, patient's EMR and Pharmacy list, and upon discussion with patient.  Admitted to the hospital on 11/03/21 due to Afib. Discharge date was 11/09/21. Discharged from [facility]?Medications Started at [facility] Discharge:?? None noted.   Medication Changes at Hospital Discharge: None noted.   Medications Discontinued at Hospital Discharge: None noted  Medications that remain the same after Hospital Discharge:??  All other medications will remain the same.     Medications: Outpatient Encounter Medications as of 01/03/2022  Medication Sig Note   apixaban (ELIQUIS) 5 MG TABS tablet Take 1 tablet (5 mg total) by mouth 2 (two) times daily.    atorvastatin (LIPITOR) 20 MG tablet TAKE 1 TABLET BY MOUTH EVERY DAY    Blood Glucose Monitoring Suppl (ONETOUCH VERIO IQ SYSTEM) w/Device KIT 1 each by Does not apply route 2 (two) times daily.    carvedilol (COREG) 25 MG tablet Take 1 tablet (25 mg total) by mouth 2 (two) times daily.    Continuous Blood Gluc Sensor (FREESTYLE LIBRE 2 SENSOR) MISC Use as directed to monitor blood glucose continuously. Change sensor Q 14 days. DX E11.65    diltiazem (CARDIZEM CD) 120 MG 24 hr capsule Take 2 capsules (240 mg total) by mouth daily.    diltiazem (CARDIZEM LA) 240 MG 24 hr tablet Take 1 tablet (240 mg total) by mouth daily.    doxazosin (CARDURA) 2 MG tablet Take 1 tablet (2 mg total) by mouth daily.    ferrous  sulfate 325 (65 FE) MG EC tablet Take 325 mg by mouth 3 (three) times daily with meals. 11/03/2021: Patient states that he has been out of this medication for about two weeks now.   furosemide (LASIX) 40 MG tablet TAKE 1 TABLET (40 MG TOTAL) BY MOUTH AS NEEDED FOR FLUID OR EDEMA.    glucose blood test strip 1 each by Other route as needed for other. Use as instructed    hydrALAZINE (APRESOLINE) 50 MG tablet TAKE 1 TABLET BY MOUTH THREE TIMES A DAY    JARDIANCE 25 MG TABS tablet TAKE 1 TABLET (25 MG TOTAL) BY MOUTH DAILY.    losartan (COZAAR) 100 MG tablet TAKE 1 TABLET BY MOUTH EVERY DAY    pioglitazone (ACTOS) 30 MG tablet Take 30 mg by mouth daily. 11/03/2021: Patient states that this medication does not sound familiar.   warfarin (COUMADIN) 10 MG tablet Take 0.5-1 tablets (5-10 mg total) by mouth daily. Pt takes 5 mg Mon-Wed-Fri-Sat and 10 mg Tues-Thurs-Sun    No facility-administered encounter medications on file as of 01/03/2022.    Upon chart review patient was referrred to a long term skilled nursing facility 01/02/22.Will unenroll patient from CCM   Future Appointments  Date Time Provider [place]  01/04/2022  9:00 AM Claris Pong, PT [facility] None  01/10/2022  8:00 AM Sharen Counter, [name] [facility]  01/16/2022  9:00 AM Saporito, Maree Erie, [name] [facility] None  01/17/2022  2:45 PM Sharen Counter, [name] [facility]  01/24/2022  2:00 PM Cori Razor [facility]  01/31/2022  2:45 PM Sharen Counter, [name] [facility]  02/06/2022  2:20 PM [name], MD [facility] None  02/07/2022  2:00 PM Sharen Counter, [name] [facility]  03/20/2022  9:30 AM [name], MD [facility] None  04/05/2022  9:00 AM BSFM-NURSE HEALTH ADVISOR [facility] [place], [facility] Clinical Pharmacist Assistant  [phone]

## 2022-01-03 NOTE — Therapy (Addendum)
OUTPATIENT PHYSICAL THERAPY HEAD AND NECK EVALUATION   Patient Name: Brian [name] MRN: [id number] DOB:12/18/1955, 66 y.o., male Today's Date: 01/04/2022   PT End of Session - 01/04/22 0959     Visit Number 1    Number of Visits 8    Date for PT Re-Evaluation 03/01/22    PT Start Time 0905    PT Stop Time 0946    PT Time Calculation (min) 41 min    Activity Tolerance Patient tolerated treatment well    Behavior During Therapy  [facility] for tasks assessed/performed             Past Medical History:  Diagnosis Date   Arthritis    Atrial fibrillation ([place])    Cancer ([place])    SCC base of tongue 2021   Cardiomyopathy    Cerebrovascular accident [facility])    2012   Diabetes mellitus    Hyperlipidemia    Hypertension    Left-sided weakness    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 ([place])    Dr. Zigmund Daniel, laser therapy OD   Seizures ([place])    pt reports this is from low blood sugar   Past Surgical History:  Procedure Laterality Date   COLONOSCOPY     DIRECT LARYNGOSCOPY N/A 02/10/2020   Procedure: DIRECT LARYNGOSCOPY WITH BIOPSY;  Surgeon: [name], MD;  Location: [facility] OR;  Service: ENT;  Laterality: N/A;   Ear surgery as a child     IR GASTROSTOMY TUBE MOD SED  03/23/2020   IR GASTROSTOMY TUBE REMOVAL  06/01/2020   IR IMAGING GUIDED PORT INSERTION  03/09/2020   IR REMOVAL TUN ACCESS W/ PORT W/O FL MOD SED  02/08/2021   TONSILLECTOMY     Patient Active Problem List   Diagnosis Date Noted   Chronic congestive heart failure with left ventricular diastolic dysfunction ([place]) 11/19/2021   History of CVA with residual deficit 11/03/2021   Dysphagia 11/03/2021   Fall at home, initial encounter 56/81/2751   Diastolic dysfunction 70/02/7492   Pancytopenia ([place]) 11/03/2021   Primary cancer of left lower lobe of lung ([place]) 06/13/2021   Normocytic normochromic anemia 03/20/2021   Stage 3b chronic kidney disease ([place]) 03/13/2021   Syncope and collapse 02/02/2021   Dilated  cardiomyopathy ([place]) 02/02/2021   Weight loss, unintentional 05/26/2020   Insomnia 05/09/2020   CKD (chronic kidney disease) 05/09/2020   Chemotherapy induced diarrhea 05/09/2020   Malignant neoplasm of base of tongue ([place]) 03/02/2020   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 ([place])    Status epilepticus ([place]) 01/18/2013   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 status epilepticus, not intractable ([place]) 01/15/2013   Hyperlipidemia    Long term current use of anticoagulant 03/11/2010   Diabetes mellitus, type II ([place]) 12/20/2009   Essential hypertension, benign 12/20/2009   Atrial fibrillation ([place]) 49/67/5916   Systolic heart failure ([place]) 12/20/2009   CEREBROVASCULAR ACCIDENT 12/20/2009    PCP: Jenna Luo, MD  REFERRING PROVIDER: [name], MD  REFERRING DIAG: Lymphedema post radiation  THERAPY DIAG:  Malignant neoplasm of base of tongue ([place])  Lymphedema, not elsewhere classified  Disorder of the skin and subcutaneous tissue related to radiation, unspecified  Abnormal posture  Rationale for Evaluation and Treatment: Rehabilitation  ONSET DATE: 05/04/2020  SUBJECTIVE:  SUBJECTIVE STATEMENT: Pt is presently having SLP for assistance with swallowing/eating. 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, 02/26/20- PET hypermetabolic lesion at the right base of tongue and right tonsil consistent with oropharyngeal carcinoma, bulky bilateral hypermetabolic level 2 metastatic lymph nodes no evidence of metastatic disease in chest, abdomen or pelvis will  receive 35 fractions of radiation to his base of tongue and bilateral neck with weekly cisplatin, started radiation 1/24 and chemo on 1/31; PEG placed 2/2/222,   He completed chemo in March 2022 and  completed radiation to his base of tongue on 05/04/2020    Pt had a feeding tube but that is has been removed He is  see SLP due to having trouble with swallowing/ eating. Also had SBRT to left lung on 07/06/2021. Past history  a fib, major stroke about 10 yrs ago, diabetes   PATIENT GOALS:  PAIN:  Are you having pain? No head and neck lymphedema , CVA (left side affected),Diabetes, Afib, prior left shoulder dislocation with continued difficulty, CHF, CKD   OBJECTIVE:   POSTURE:  Significant Forward head and rounded shoulders posture  SHOULDER AROM:   WFL on right, deficits on left from CVA/dislocated shoulder  CERVICAL ROM; 50% limited with rotation, 60% with bilateral SB    LYMPHEDEMA ASSESSMENT:    Circumference in cm  4 cm superior to sternal notch around neck 41.5  6 cm superior to sternal notch around neck 42.3  8 cm superior to sternal notch around neck 43.5  R lateral nostril from base of nose to medial tragus   L lateral nostril from base of nose to medial tragus   R corner of mouth to where ear lobe meets face   L corner of mouth to where ear lobe meets face         (Blank rows=not tested)  CURRENT/PAST TREATMENTS:  Surgery type/date: 02/09/2020 Direct laryngoscopy with biopsy  Chemotherapy: completed March 2022  Radiation: received 35 fractions of radiation to his base of tongue and bilateral neck, completed 05/04/2020   OTHER SYMPTOMS: Pain No Fibrosis Yes Pitting edema minimal Infections No Decreased scar mobility No   TREATMENT TODAY: 01/04/2022 Performed MLD to pts neck using Norton anterior approach as follows: short neck, 5 diaphragmatic breaths, bilateral axillary nodes, bilateral pectoral nodes, anterior chest, short neck, posterior neck moving fluid  towards pathway aimed at lateral neck, then lateral and anterior neck moving fluid towards pathway aimed at lateral neck then retracing all steps .   PATIENT EDUCATION:  Education details: [employer], engineering site for [employer], Son, Marjory Lies will order, use of MLD Person educated: Patient and Child(ren) Education method: Explanation and Handouts Education comprehension: verbalized understanding  HOME EXERCISE PROGRAM: None given today  ASSESSMENT:  CLINICAL IMPRESSION: This 66 year old male is s/p chemotherapy as well as radiation to his base of tongue and bilateral neck that was completed on 05/04/2020 .He presents with generalized bilateral neck swelling with fibrosis.He had prior PT and was fit for a neck garment . It was difficult for him to get on and has since been lost. He is seeing SLP next door for help with swallowing and is agreeable to coming here 1x/week ideally on the same day that he sees  SLP. He will benefit from a new garment (being ordered by his son, and MLD to help decongest the generalized swelling/fibrosis in his neck.  Pt will benefit from skilled therapeutic intervention to improve on the following deficits: decreased knowledge of condition, increased edema, increased fascial restrictions, and postural dysfunction  PT treatment/interventions: ADL/Self care home management, Therapeutic exercises, Patient/Family education, Self Care, Orthotic/Fit training, Manual lymph drainage, Manual therapy, and Re-evaluation   GOALS Name Target Date (Remove Blue Hyperlink) Goal status  1 Pt will have proper compression garment to reduce cervical Lymphedema Baseline: Lost previous Son will order 03/01/2022 INITIAL  2 Pt will note softening of fibrosis in neck and reduction in swelling by 1-2 cm in neck region 03/01/2022 INITIAL  3 Pt will be educated in basic MLD steps for bilateral neck 03/01/2022 INITIAL  4        GOALS: Goals reviewed with patient?  Yes  LONG TERM GOALS:  (STG=LTG)   PLAN:  PT FREQUENCY/DURATION: 1x/week ideally on the same day he sees SLP to save an extra trip x 8 weeks  PLAN FOR NEXT SESSION: MLD and instruct pt in basic techniques, assist with garment donning and fit, measure weekly  Claris Pong, PT 01/04/2022, 10:04 AM

## 2022-01-04 ENCOUNTER — Other Ambulatory Visit: Payer: Self-pay

## 2022-01-04 ENCOUNTER — Ambulatory Visit: Payer: Medicare HMO | Attending: Radiation Oncology

## 2022-01-04 DIAGNOSIS — R1310 Dysphagia, unspecified: Secondary | ICD-10-CM | POA: Diagnosis not present

## 2022-01-04 DIAGNOSIS — I890 Lymphedema, not elsewhere classified: Secondary | ICD-10-CM | POA: Diagnosis not present

## 2022-01-04 DIAGNOSIS — R293 Abnormal posture: Secondary | ICD-10-CM | POA: Diagnosis not present

## 2022-01-04 DIAGNOSIS — C01 Malignant neoplasm of base of tongue: Secondary | ICD-10-CM | POA: Insufficient documentation

## 2022-01-04 DIAGNOSIS — L599 Disorder of the skin and subcutaneous tissue related to radiation, unspecified: Secondary | ICD-10-CM

## 2022-01-09 ENCOUNTER — Ambulatory Visit: Payer: Medicare HMO | Admitting: Physical Therapy

## 2022-01-09 DIAGNOSIS — R1310 Dysphagia, unspecified: Secondary | ICD-10-CM | POA: Diagnosis not present

## 2022-01-09 DIAGNOSIS — R293 Abnormal posture: Secondary | ICD-10-CM | POA: Diagnosis not present

## 2022-01-09 DIAGNOSIS — I890 Lymphedema, not elsewhere classified: Secondary | ICD-10-CM | POA: Diagnosis not present

## 2022-01-09 DIAGNOSIS — C01 Malignant neoplasm of base of tongue: Secondary | ICD-10-CM | POA: Diagnosis not present

## 2022-01-09 DIAGNOSIS — L599 Disorder of the skin and subcutaneous tissue related to radiation, unspecified: Secondary | ICD-10-CM

## 2022-01-09 NOTE — Therapy (Signed)
OUTPATIENT PHYSICAL THERAPY HEAD AND NECK EVALUATION   Patient Name: Brian [name] MRN: [id number] DOB:12/18/1955, 66 y.o., male Today's Date: 01/09/2022   PT End of Session - 01/09/22 0952     Visit Number 2    Number of Visits 8    Date for PT Re-Evaluation 03/01/22    PT Start Time 0910    PT Stop Time 0952    PT Time Calculation (min) 42 min    Activity Tolerance Patient tolerated treatment well             Past Medical History:  Diagnosis Date   Arthritis    Atrial fibrillation ([place])    Cancer ([place])    SCC base of tongue 2021   Cardiomyopathy    Cerebrovascular accident [facility])    2012   Diabetes mellitus    Hyperlipidemia    Hypertension    Left-sided weakness    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 Zigmund Daniel, laser therapy OD   Seizures ([place])    pt reports this is from low blood sugar   Past Surgical History:  Procedure Laterality Date   COLONOSCOPY     DIRECT LARYNGOSCOPY N/A 02/10/2020   Procedure: DIRECT LARYNGOSCOPY WITH BIOPSY;  Surgeon: [name], MD;  Location: [facility];  Service: ENT;  Laterality: N/A;   Ear surgery as a child     IR GASTROSTOMY TUBE MOD SED  03/23/2020   IR GASTROSTOMY TUBE REMOVAL  06/01/2020   IR IMAGING GUIDED PORT INSERTION  03/09/2020   IR REMOVAL TUN ACCESS W/ PORT W/O FL MOD SED  02/08/2021   TONSILLECTOMY     Patient Active Problem List   Diagnosis Date Noted   Chronic congestive heart failure with left ventricular diastolic dysfunction ([place]) 11/19/2021   History of CVA with residual deficit 11/03/2021   Dysphagia 11/03/2021   Fall at home, initial encounter 72/10/4707   Diastolic dysfunction 62/83/6629   Pancytopenia ([place]) 11/03/2021   Primary cancer of left lower lobe of lung ([place]) 06/13/2021   Normocytic normochromic anemia 03/20/2021   Stage 3b chronic kidney disease ([place]) 03/13/2021   Syncope and collapse 02/02/2021   Dilated cardiomyopathy ([place]) 02/02/2021   Weight loss, unintentional 05/26/2020    Insomnia 05/09/2020   CKD (chronic kidney disease) 05/09/2020   Chemotherapy induced diarrhea 05/09/2020   Malignant neoplasm of base of tongue ([place]) 03/02/2020   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 ([place])    Status epilepticus ([place]) 01/18/2013   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 status epilepticus, not intractable ([place]) 01/15/2013   Hyperlipidemia    Long term current use of anticoagulant 03/11/2010   Diabetes mellitus, type II ([place]) 12/20/2009   Essential hypertension, benign 12/20/2009   Atrial fibrillation ([place]) 47/65/4650   Systolic heart failure ([place]) 12/20/2009   CEREBROVASCULAR ACCIDENT 12/20/2009    PCP: Jenna Luo, MD  REFERRING PROVIDER: [name], MD  REFERRING DIAG: Lymphedema post radiation  THERAPY DIAG:  Malignant neoplasm of base of tongue ([place])  Disorder of the skin and subcutaneous tissue related to radiation, unspecified  Abnormal posture  Rationale for Evaluation and Treatment: Rehabilitation  ONSET DATE: 05/04/2020  SUBJECTIVE:  SUBJECTIVE STATEMENT: Pt states he has not ordered his lymphedema garment yet, but will soon.  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, 02/26/20- PET hypermetabolic lesion at the right base of tongue and right tonsil consistent with oropharyngeal carcinoma, bulky bilateral hypermetabolic level 2 metastatic lymph nodes no evidence of metastatic disease in chest, abdomen or pelvis will receive 35 fractions of radiation to his base of tongue and bilateral neck with weekly cisplatin, started radiation 1/24 and chemo  on 1/31; PEG placed 2/2/222,   He completed chemo in March 2022 and  completed radiation to his base of tongue on 05/04/2020    Pt had a feeding tube but that is has been removed He is  see SLP due to having trouble with swallowing/ eating. Also had SBRT to left lung on 07/06/2021. Past history  a fib, major stroke about 10 yrs ago, diabetes   PATIENT GOALS:  PAIN:  Are you having pain? No head and neck lymphedema , CVA (left side affected),Diabetes, Afib, prior left shoulder dislocation with continued difficulty, CHF, CKD   OBJECTIVE:   POSTURE:  Significant Forward head and rounded shoulders posture  SHOULDER AROM:   WFL on right, deficits on left from CVA/dislocated shoulder  CERVICAL ROM; 50% limited with rotation, 60% with bilateral SB    LYMPHEDEMA ASSESSMENT:    Circumference in cm  4 cm superior to sternal notch around neck 41.5  6 cm superior to sternal notch around neck 42.3  8 cm superior to sternal notch around neck 43.5  R lateral nostril from base of nose to medial tragus   L lateral nostril from base of nose to medial tragus   R corner of mouth to where ear lobe meets face   L corner of mouth to where ear lobe meets face         (Blank rows=not tested)  CURRENT/PAST TREATMENTS:  Surgery type/date: 02/09/2020 Direct laryngoscopy with biopsy  Chemotherapy: completed March 2022  Radiation: received 35 fractions of radiation to his base of tongue and bilateral neck, completed 05/04/2020   OTHER SYMPTOMS: Pain No Fibrosis Yes Pitting edema minimal Infections No Decreased scar mobility No   TREATMENT TODAY: 01/09/2022: Began with deep breaths and neck and scapular ROM as able.  Encouraged pt to do shoulder ROM and reaching especially with right arm as able and to walk as much as posssible at home  Performed MLD to pts neck using Norton anterior approach as follows: short neck, 5 diaphragmatic breaths, bilateral axillary nodes, bilateral pectoral nodes, anterior  chest, short neck, posterior neck moving fluid towards pathway aimed at lateral neck, then lateral and anterior neck moving fluid towards pathway aimed at lateral neck then retracing all steps . Talked with pt about head and neck Flexitouch, but pt is not interested at this time.    01/04/2022 Performed MLD to pts neck using Norton anterior approach as follows: short neck, 5 diaphragmatic breaths, bilateral axillary nodes, bilateral pectoral nodes, anterior chest, short neck, posterior neck moving fluid towards pathway aimed at lateral neck, then lateral and anterior neck moving fluid towards pathway aimed at lateral neck then retracing all steps .   PATIENT EDUCATION:  encouraged pt to order garment doing exercise with use of garment HOME EXERCISE  PROGRAM:neck and scapular ROM especially opening mouth and sticking out tongue    ASSESSMENT:  CLINICAL IMPRESSION: Pt continues with thick lymphostatic fibrosis in neck especailly on left side of neck and cheek that decreased palpably with MLD especially with compression at fibrosis and stationary circles around it.  Feel that pt will respond well with compression garment and encouraged him to get it.  Also feel pt would do well with Flexitouch,but he is not interested at this time as he has too much going on his his life otherwise.  He will consider it and ask about it later if he does not get the results he wants with self MLD, compression garment and exercise   Pt will benefit from skilled therapeutic intervention to improve on the following deficits: decreased knowledge of condition, increased edema, increased fascial restrictions, and postural dysfunction  PT treatment/interventions: ADL/Self care home management, Therapeutic exercises, Patient/Family education, Self Care, Orthotic/Fit training, Manual lymph drainage, Manual therapy, and Re-evaluation   GOALS Name Target Date (Remove Blue Hyperlink) Goal status  1 Pt will have proper  compression garment to reduce cervical Lymphedema Baseline: Lost previous Son will order 03/01/2022 INITIAL  2 Pt will note softening of fibrosis in neck and reduction in swelling by 1-2 cm in neck region 03/01/2022 INITIAL  3 Pt will be educated in basic MLD steps for bilateral neck 03/01/2022 INITIAL  4        GOALS: Goals reviewed with patient? Yes  LONG TERM GOALS:  (STG=LTG)   PLAN:  PT FREQUENCY/DURATION: 1x/week ideally on the same day he sees SLP to save an extra trip x 8 weeks  PLAN FOR NEXT SESSION: MLD and instruct pt in basic techniques, assist with garment donning and fit, reoinforce exercise. measure weekly  Donato Heinz. Owens Shark PT   Norwood Levo, PT 01/09/2022, 9:53 AM

## 2022-01-10 ENCOUNTER — Ambulatory Visit: Payer: Medicare HMO

## 2022-01-10 DIAGNOSIS — R1313 Dysphagia, pharyngeal phase: Secondary | ICD-10-CM

## 2022-01-10 NOTE — Therapy (Signed)
OUTPATIENT SPEECH LANGUAGE PATHOLOGY TREATMENT   Patient Name: Brian [name] MRN: [id number] DOB:August 29, 1955, 66 y.o., male Today's Date: 01/10/2022  PCP: Jenna Luo, MD REFERRING PROVIDER: [name], MD    End of Session - 01/10/22 [phone]     Visit Number 5    Number of Visits 21    Date for SLP Re-Evaluation 02/15/22    SLP Start Time 0805    SLP Stop Time  1025    SLP Time Calculation (min) 31 min    Activity Tolerance Patient tolerated treatment well                Past Medical History:  Diagnosis Date   Arthritis    Atrial fibrillation ([place])    Cancer ([place])    SCC base of tongue 2021   Cardiomyopathy    Cerebrovascular accident [facility])    2012   Diabetes mellitus    Hyperlipidemia    Hypertension    Left-sided weakness    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 ([place])    Dr. Zigmund Daniel, laser therapy OD   Seizures ([place])    pt reports this is from low blood sugar   Past Surgical History:  Procedure Laterality Date   COLONOSCOPY     DIRECT LARYNGOSCOPY N/A 02/10/2020   Procedure: DIRECT LARYNGOSCOPY WITH BIOPSY;  Surgeon: [name], MD;  Location: [facility];  Service: ENT;  Laterality: N/A;   Ear surgery as a child     IR GASTROSTOMY TUBE MOD SED  03/23/2020   IR GASTROSTOMY TUBE REMOVAL  06/01/2020   IR IMAGING GUIDED PORT INSERTION  03/09/2020   IR REMOVAL TUN ACCESS W/ PORT W/O FL MOD SED  02/08/2021   TONSILLECTOMY     Patient Active Problem List   Diagnosis Date Noted   Chronic congestive heart failure with left ventricular diastolic dysfunction ([place]) 11/19/2021   History of CVA with residual deficit 11/03/2021   Dysphagia 11/03/2021   Fall at home, initial encounter 85/27/7824   Diastolic dysfunction 23/53/6144   Pancytopenia ([place]) 11/03/2021   Primary cancer of left lower lobe of lung ([place]) 06/13/2021   Normocytic normochromic anemia 03/20/2021   Stage 3b chronic kidney disease ([place]) 03/13/2021   Syncope and collapse 02/02/2021   Dilated  cardiomyopathy ([place]) 02/02/2021   Weight loss, unintentional 05/26/2020   Insomnia 05/09/2020   CKD (chronic kidney disease) 05/09/2020   Chemotherapy induced diarrhea 05/09/2020   Malignant neoplasm of base of tongue ([place]) 03/02/2020   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 ([place])    Status epilepticus ([place]) 01/18/2013   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 status epilepticus, not intractable ([place]) 01/15/2013   Hyperlipidemia    Long term current use of anticoagulant 03/11/2010   Diabetes mellitus, type II ([place]) 12/20/2009   Essential hypertension, benign 12/20/2009   Atrial fibrillation ([place]) 31/54/0086   Systolic heart failure ([place]) 12/20/2009   CEREBROVASCULAR ACCIDENT 12/20/2009    ONSET DATE:  Winter 2022  REFERRING DIAG: C01 (ICD-10-CM) - Malignant neoplasm of base of tongue ([place])   THERAPY DIAG:  Dysphagia, pharyngeal phase  Rationale for Evaluation and Treatment Rehabilitation  SUBJECTIVE:   SUBJECTIVE STATEMENT: "My neck isn't as hard as it normally is. I went to that (PT) next door yesterday." "Doctors, doctors, doctors. If I don't have to do that swallow test I don't want to do it."  Pt accompanied by: self and family member (son)  PERTINENT HISTORY: Pt seen for short course of ST in 2022 for prophylactic swallow exercises/therapy but pt did not  schedule further ST after his 3rd visit. 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 Previous CVA 2011 which pt reportedly did not attend recommended OP therapies. CHF, CVA, Afib, CKD, DM type 2.  PAIN:  Are you having pain? No  PATIENT GOALS: Swallow improvement  OBJECTIVE:   DIAGNOSTIC FINDINGS:  CT head 11-03-21 IMPRESSION: No acute intracranial abnormality. Chronic parietal lobe infarcts, right greater than left. Chronic left caudate infarct. Advanced chronic small vessel ischemic disease.  RECOMMENDATIONS FROM OBJECTIVE SWALLOW STUDY (MBSS/FEES):  Modified Barium Swallow Progress Note  Today's Date: 10/26/2021 Modified Barium Swallow    Brief recommendations include the following: Clinical Impression             Pt has a mild-moderate pharyngeal dysphagia suspected to be related to post-radiation changes with what appears to be edematous tissue, particularly noted at the epiglottis and arytenoids. He has reduced base of tongue retraction, hyolaryngeal excursion, epiglottic inversion, and laryngeal vestibule closure. Because he does not achieve full closure, all liquids and even purees are penetrated in small amounts, but can be cleared well with a cued throat clear. Thin liquid penetrates reach the level of the true vocal folds and do fall below, with aspiration that is sensed as evidenced by a spontaneous throat clear (PAS 7). This throat clear does not adequately move aspirates, but when he is cued to clear his throat harder, he ejects them from his airway completely. Other compensatory strategies, such as a supraglottic swallow or chin tuck, do not increase airway protection. Pt has increased residue in his valleculae with thicker liquids and foods, with residue most significant when given a bite of a cereal bar. This stickier texture left severe residue in his valleculae (almost the entire bolus), but he still maintained good control of it by bringing it back up into his mouth and trying to re-swallow it. He ultimately used a liquid wash to clear most of this residue. This happened to be the only time during the study that pt had any subjective c/o residue, and it was the most significant. Education was provided to pt with daughter also present. Given that penetration is likely with thicker liquids (and aspiration possible across a meal), there is increased residue, and pt has remained stable from a pulmonary standpoint, recommend continuing with thin liquids in the setting of thorough oral care and implementation of a cough or more effortful throat clear post-swallow. Could consider making foods a little softer (mechanical soft) to  facilitate pharyngeal clearance and subjective symptoms. Given that pt is reporting an acute change in swallowing, recommend f/u with OP SLP. Swallow Evaluation Recommendations  SLP Diet Recommendations: Dysphagia 3 (Mech soft) solids;Thin liquid  Liquid Administration via: Cup;Straw  Medication Administration: Whole meds with puree  Supervision: Patient able to self feed  Compensations: Slow rate;Small sips/bites;Clear throat after each swallow;Hard cough after swallow  Postural Changes: Seated upright at 90 degrees  Oral Care Recommendations: Oral care BID   PATIENT REPORTED OUTCOME MEASURES (PROM): EAT-10: provided on 12/13/21.   TODAY'S TREATMENT:  01/10/22: "I forgot that damn paper again. I'm sorry. (PROM)." Pt to bring PROM next session. Pt remains consistent with frequency of HEP. Procedure today was WNL. SLP asked about precautions: "I forget sometimes but not too often." Today pt req'd initial cues for smaller sips and throat clear/cough but then was indpendent after this. Pt c/o thickened saliva and told SLP he drinks very little water during the day. SLP suggested 60 oz/day. Pt will attempt this. He stated he  does not want to a follow up MBS but will instead cont to follow precautions from this time forward. He reduced frequency today to every other week.   12-27-21: Pt states he has done HEP twice, maybe three or four times a day - "depends on what I'm doing that day." SLP reiterated goal to pt to have 20-30 reps done of each exercise/day. He acknowledged understanding. Procedure for HEP is WNL. He followed dysphagia precautions with POs with initial reminder but did not require any more cues after this. Pt stated he was performing cough or throat clear and re-swallow with POs.  Pt has lymphedema eval 01-04-22. SLP stated if pt success continued he could reduce frequency starting next week.   12/21/21: Pt cont'd to present with what appears to be lymphedema, and asks SLP if he  notices pt's left side of face "is bigger than this (right) side?". SLP inquired with referring MD about PT referral.  SLP req'd to provide cues for pt to cough/throat clear and reswallow with POs. He politely refused cereal bar and SLP provided pt peanut butter crackers which he said he ate every 3-4 weeks. Pt took one bite and said it was difficult to achieve pharyngeal clearance and refused subsequent bites. SLP told pt that this PO type was not on his recommended diet and confirmed with him that items you can mash with your fork would be recommended. Pt acknowledged these would be much easier for him to swallow.  He req'd min A rarely with HEP today. Told SLP he has been completing HEP "almost every day" except for CTAR due to not rolling up a towel yet. SLP strongly urged him to do this and provided guidance again on what 4" looked like.   12/13/21: Provided pt EAT-10 and he will return next session. Pt req's mod A faded to independence with effortful and MAsako, and SLP taught pt CTAR and Mendelsohn, which he req'd initial min-mod A faded to independence. Pt has completed effortful swallow but not Mas[place] since last session but now knows he needs to complete all 4 exercises BID, and was explained rationale for this, as well as rationale for cough/throat clear and reswallow with EVERY bite or sip. "I'm taking smaller bites and sips for sure, buddy," pt told SLP.   12/06/21: SLP discussed the importance of performing HEP scope and frequency as prescribed, BID. SLP educated pt on the procedure for HEP exercises.   PATIENT EDUCATION: Education details: See "today's treatment"    Person educated: Patient and Child(ren) Education method: Explanation, Demonstration, Verbal cues, and Handouts Education comprehension: verbalized understanding, returned demonstration, verbal cues required, and needs further education   ASSESSMENT:  CLINICAL IMPRESSION: Patient is a 66 y.o. male who is seen for swallow  treatment to perform HEP, and adhere to precautions after MBSS 10-26-21. Pt now has all 4 exercises for his HEP and told SLP he will complete as directed. If pt success today (12-27-21) contiues he can decr frequency.  OBJECTIVE IMPAIRMENTS: include dysphagia. These impairments are limiting patient from safety when swallowing. Factors affecting potential to achieve goals and functional outcome are cooperation/participation level and severity of impairments. Patient will benefit from skilled SLP services to address above impairments and improve overall function.  REHAB POTENTIAL: Fair due to severity and cooperation/participation.   GOALS: Goals reviewed with patient? Yes  SHORT TERM GOALS: Target date: 01/11/2022   Pt will follow precautions with POs in 3 sessions with rare min A Baseline:12-27-21,01-10-22 Goal status: ongoing  2.  Pt will complete HEP for swallowing with rare min A in 3 sessions Baseline:  Goal status: met - indpendent  3.  Pt will tell SLP 3 overt s/sx aspiration PNA with modified independence Baseline:  Goal status: met   LONG TERM GOALS: Target date: 02/15/2022    Pt's EAT-10 score will improve by last day of therapy Baseline:  Goal status: ongoing  2.  Pt will follow precautions with POs in 3 sessions with modified independence (mod I) Baseline:  Goal status: ongoing  3.  Pt will complete HEP for swallowing with mod I in 3 sessions Baseline: 01-10-22 Goal status: ongoing  4.  Pt will undergo follow up modified barium swallow exam if/when he completes 8 weeks of HEP, at least 6 days/week Baseline:  Goal status: ongoing   PLAN: SLP FREQUENCY: 1x/week  SLP DURATION: 10 weeks  PLANNED INTERVENTIONS: Aspiration precaution training, Pharyngeal strengthening exercises, Diet toleration management , Environmental controls, Trials of upgraded texture/liquids, Internal/external aids, Oral motor exercises, SLP instruction and feedback, Compensatory strategies,  Patient/family education, and Re-evaluation    [place], [name] 01/10/2022, 8:39 AM

## 2022-01-10 NOTE — Patient Instructions (Addendum)
Signs of Aspiration Pneumonia  (food or liquid going into lungs and causing infection)  Chest pain/tightness Fever (can be low grade) Cough  With foul-smelling phlegm (sputum) With sputum containing pus or blood With greenish sputum Fatigue  Shortness of breath  Wheezing   **IF YOU HAVE THESE SIGNS, CONTACT YOUR DOCTOR OR GO TO THE EMERGENCY DEPARTMENT OR URGENT CARE AS SOON AS POSSIBLE**

## 2022-01-16 ENCOUNTER — Encounter: Payer: Self-pay | Admitting: *Deleted

## 2022-01-16 ENCOUNTER — Ambulatory Visit: Payer: Self-pay | Admitting: *Deleted

## 2022-01-16 ENCOUNTER — Ambulatory Visit: Payer: Medicare HMO

## 2022-01-16 DIAGNOSIS — C01 Malignant neoplasm of base of tongue: Secondary | ICD-10-CM

## 2022-01-16 DIAGNOSIS — R293 Abnormal posture: Secondary | ICD-10-CM | POA: Diagnosis not present

## 2022-01-16 DIAGNOSIS — L599 Disorder of the skin and subcutaneous tissue related to radiation, unspecified: Secondary | ICD-10-CM

## 2022-01-16 DIAGNOSIS — R1310 Dysphagia, unspecified: Secondary | ICD-10-CM | POA: Diagnosis not present

## 2022-01-16 DIAGNOSIS — I890 Lymphedema, not elsewhere classified: Secondary | ICD-10-CM | POA: Diagnosis not present

## 2022-01-16 NOTE — Patient Outreach (Signed)
  Care Coordination   Follow Up Visit Note   01/16/2022  Name: Brian [name] MRN: [id number] DOB: Sep 04, 1955  [name] is a 66 y.o. year old male who sees Pickard, Cammie Mcgee, MD for primary care. I spoke with patient's son, Kebin Maye by phone today.  What matters to the patients health and wellness today?  Receive Assistance Applying for [place] Medicaid and Placement into a [place].   Goals Addressed             This Visit's Progress    Receive Assistance Applying for [place] Medicaid and Placement into a [place].   On track    Care Coordination Interventions:  Task-centered strategies employed. Caregiver stress acknowledged. Caregiver support provided. Son applied for [place] Medicaid, through the [place].   ~ Special Assistance Long-Term Care Medicaid Application submitted and currently being processed. ~ Await approval/denial letter from the [place], Programme researcher, broadcasting/film/video for [place] Medicaid.  Son applied for Adult Medicaid, through the [place]. ~ Adult Medicaid Application submitted and currently being processed. ~ Await approval/denial letter from the [place], Programme researcher, broadcasting/film/video for Adult Medicaid. Son will review the following list of respite care agencies and facilities, assisted living facilities, rest homes and family care homes in [facility], and begin touring agencies, homes and facilities of interest: ~ Careers adviser and Facilities in [place] ~ [place] in [place] ~ [place] and [place] in [facility] Son agreed to contact [place] directly (# [phone]), to provide respite care  agencies and facilities, assisted living facilities, rest homes and family care homes in [place], of interest. [place] collaboration with Primary Care Provider, Dr. Claretta Fraise, to request completed and signed FL-2 Form. Once completed and signed FL-2 Form is obtained, CSW will fax to all respite care agencies and facilities, assisted living facilities, rest homes and family care homes in [facility], of interest to son.       SDOH assessments and interventions completed:  Yes.  Care Coordination Interventions:  Yes, provided.   Follow up plan: Follow up call scheduled for 01/30/2022 at 11:15 am.  Encounter Outcome:  Pt. Visit Completed.   Nat Christen, BSW, MSW, [name]  Licensed Education officer, environmental [facility]  Mailing [place] [address], [place], [place] [address] Physical Address-[address] [place], [place] [address] Toll Free Main # [phone] Fax # [phone] Cell # [phone] [email or web address]

## 2022-01-16 NOTE — Patient Instructions (Addendum)
Manual lymph drainage for the neck, anterior approach  Sit in front of a mirror. Do 5 slow deep breaths, breathing in through the nose and out through the mouth, letting your belly "inflate" as you breathe in.  Rest your hands on your abdomen as you do this to give slight pressure there.  1) Place hands on areas just behind collar bones and do 10 stationary circles with stretch in outward directions. 2) Do stationary circles at each armpit about 10 times. 3) Place one hand on the front of the opposite shoulder and do stationary circles with stretch downward toward underarm. 4) Repeat #1 above. 5) Imagine a river running in a line from the earlobe straight down the neck.  Place hands just behind this and do circles with stretch coming forward slightly and down, thinking about fluid flowing down that river.  Do 10 times. 6) Place one hand just in front of the river on one side and do circles with a slight back and then downward stretch, thinking again about putting that fluid in the river.  Do 10-20 times on each side. 7) Place one hand just slightly in front of the spot you just did and do the same thing.  DO THIS VERY GENTLY. 8) Use the webspace between your thumb and index finger to pump downward starting just under the chin and "stair stepping" downward with a stretch, working down to the chest. 9) Repeat #1,2,3 Do not slide on the skin, but STRETCH it with your motions. Only give enough pressure to stretch the skin. DO THIS SLOWLY, PLEASE!  And do once a day.  Cancer Rehab [phone]

## 2022-01-16 NOTE — Patient Instructions (Signed)
Visit Information  Thank you for taking time to visit with me today. Please don't hesitate to contact me if I can be of assistance to you.   Following are the goals we discussed today:   Goals Addressed             This Visit's Progress    Receive Assistance Applying for [place] Medicaid and Placement into a [place].   On track    Care Coordination Interventions:  Task-centered strategies employed. Caregiver stress acknowledged. Caregiver support provided. Son applied for [place] Medicaid, through the [place].   ~ Special Assistance Long-Term Care Medicaid Application submitted and currently being processed. ~ Await approval/denial letter from the [place], Programme researcher, broadcasting/film/video for [place] Medicaid.  Son applied for Adult Medicaid, through the [place]. ~ Adult Medicaid Application submitted and currently being processed. ~ Await approval/denial letter from the [place], Programme researcher, broadcasting/film/video for Adult Medicaid. Son will review the following list of respite care agencies and facilities, assisted living facilities, rest homes and family care homes in [facility], and begin touring agencies, homes and facilities of interest: ~ Careers adviser and Facilities in [place] ~ [place] in [place] ~ [place] and [place] in [facility] Son agreed to contact [place] directly (# [phone]), to provide respite care agencies and facilities, assisted living facilities, rest homes and family care homes in [place], of interest. [place] collaboration with Primary Care Provider, Dr. Claretta Fraise, to request completed and signed FL-2 Form. Once completed and signed FL-2 Form is  obtained, CSW will fax to all respite care agencies and facilities, assisted living facilities, rest homes and family care homes in [facility], of interest to son.       Our next appointment is by telephone on 01/30/2022 at 11:15 am.  Please call the care guide team at [phone] if you need to cancel or reschedule your appointment.   If you are experiencing a Mental Health or [place] or need someone to talk to, please call the Suicide and Crisis Lifeline: 988 call the [place] National Suicide Prevention Lifeline: 9065997653 or TTY: (430)849-3552 TTY 647-710-6071) to talk to a trained counselor call 1-800-273-TALK (toll free, 24 hour hotline) go to [facility] [address] [place] [phone]) call the [place]: [phone] call 911  Patient verbalizes understanding of instructions and care plan provided today and agrees to view in [place]. Active MyChart status and patient understanding of how to access instructions and care plan via MyChart confirmed with patient.     Telephone follow up appointment with care management team member scheduled for:  01/30/2022 at 11:15 am.  Nat Christen, BSW, MSW, [place]  Licensed Clinical Social Worker  [place]  Mailing [place]. [address], [place], [place] [address] Physical Address-[address], [place],  [address] Toll Free Main # [phone] Fax # [phone] Cell # [phone] [email or web address]

## 2022-01-16 NOTE — Therapy (Addendum)
OUTPATIENT PHYSICAL THERAPY HEAD AND NECK TREATMENT   Patient Name: Brian [name] MRN: [id number] DOB:26-Feb-1955, 66 y.o., male Today's Date: 01/16/2022   PT End of Session - 01/16/22 0959     Visit Number 3    Number of Visits 8    Date for PT Re-Evaluation 03/01/22    PT Start Time 0959    PT Stop Time 8421    PT Time Calculation (min) 45 min    Activity Tolerance Patient tolerated treatment well    Behavior During Therapy [facility] for tasks assessed/performed             Past Medical History:  Diagnosis Date   Arthritis    Atrial fibrillation ([place])    Cancer ([place])    SCC base of tongue 2021   Cardiomyopathy    Cerebrovascular accident ([place])    2012   Diabetes mellitus    Hyperlipidemia    Hypertension    Left-sided weakness    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 ([place])    Dr. Zigmund Daniel, laser therapy OD   Seizures ([place])    pt reports this is from low blood sugar   Past Surgical History:  Procedure Laterality Date   COLONOSCOPY     DIRECT LARYNGOSCOPY N/A 02/10/2020   Procedure: DIRECT LARYNGOSCOPY WITH BIOPSY;  Surgeon: [name], MD;  Location: [place] OR;  Service: ENT;  Laterality: N/A;   Ear surgery as a child     IR GASTROSTOMY TUBE MOD SED  03/23/2020   IR GASTROSTOMY TUBE REMOVAL  06/01/2020   IR IMAGING GUIDED PORT INSERTION  03/09/2020   IR REMOVAL TUN ACCESS W/ PORT W/O FL MOD SED  02/08/2021   TONSILLECTOMY     Patient Active Problem List   Diagnosis Date Noted   Chronic congestive heart failure with left ventricular diastolic dysfunction ([place]) 11/19/2021   History of CVA with residual deficit 11/03/2021   Dysphagia 11/03/2021   Fall at home, initial encounter 04/30/8116   Diastolic dysfunction 86/77/3736   Pancytopenia ([place]) 11/03/2021   Primary cancer of left lower lobe of lung ([place]) 06/13/2021   Normocytic normochromic anemia 03/20/2021   Stage 3b chronic kidney disease ([place]) 03/13/2021   Syncope and collapse 02/02/2021   Dilated  cardiomyopathy ([place]) 02/02/2021   Weight loss, unintentional 05/26/2020   Insomnia 05/09/2020   CKD (chronic kidney disease) 05/09/2020   Chemotherapy induced diarrhea 05/09/2020   Malignant neoplasm of base of tongue ([place]) 03/02/2020   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 ([place])    Status epilepticus ([place]) 01/18/2013   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 status epilepticus, not intractable ([place]) 01/15/2013   Hyperlipidemia    Long term current use of anticoagulant 03/11/2010   Diabetes mellitus, type II ([place]) 12/20/2009   Essential hypertension, benign 12/20/2009   Atrial fibrillation ([place]) 68/15/9470   Systolic heart failure ([place]) 12/20/2009   CEREBROVASCULAR ACCIDENT 12/20/2009    PCP: Jenna Luo, MD  REFERRING PROVIDER: [name], MD  REFERRING DIAG: Lymphedema post radiation  THERAPY DIAG:  Malignant neoplasm of base of tongue ([place])  Disorder of the skin and subcutaneous tissue related to radiation, unspecified  Abnormal posture  Lymphedema, not elsewhere classified  Rationale for Evaluation and Treatment: Rehabilitation  ONSET DATE: 05/04/2020  SUBJECTIVE:  SUBJECTIVE STATEMENT:  I have ordered the garment and it should come Dec 1st. I am swallowing a little bit better. I have not done any of the MLD at home. 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, 02/26/20- PET hypermetabolic lesion at the right base of tongue and right tonsil consistent with oropharyngeal carcinoma, bulky bilateral hypermetabolic level 2 metastatic lymph nodes no evidence of metastatic disease in chest, abdomen or pelvis will receive 35 fractions of radiation to his base of  tongue and bilateral neck with weekly cisplatin, started radiation 1/24 and chemo on 1/31; PEG placed 2/2/222,   He completed chemo in March 2022 and  completed radiation to his base of tongue on 05/04/2020    Pt had a feeding tube but that is has been removed He is  see SLP due to having trouble with swallowing/ eating. Also had SBRT to left lung on 07/06/2021. Past history  a fib, major stroke about 10 yrs ago, diabetes   PATIENT GOALS:  PAIN:  Are you having pain? No head and neck lymphedema , CVA (left side affected),Diabetes, Afib, prior left shoulder dislocation with continued difficulty, CHF, CKD   OBJECTIVE:   POSTURE:  Significant Forward head and rounded shoulders posture  SHOULDER AROM:   WFL on right, deficits on left from CVA/dislocated shoulder  CERVICAL ROM; 50% limited with rotation, 60% with bilateral SB    LYMPHEDEMA ASSESSMENT:    Circumference in cm  4 cm superior to sternal notch around neck 41.5  6 cm superior to sternal notch around neck 42.3  8 cm superior to sternal notch around neck 43.5  R lateral nostril from base of nose to medial tragus   L lateral nostril from base of nose to medial tragus   R corner of mouth to where ear lobe meets face   L corner of mouth to where ear lobe meets face         (Blank rows=not tested)  CURRENT/PAST TREATMENTS:  Surgery type/date: 02/09/2020 Direct laryngoscopy with biopsy  Chemotherapy: completed March 2022  Radiation: received 35 fractions of radiation to his base of tongue and bilateral neck, completed 05/04/2020   OTHER SYMPTOMS: Pain No Fibrosis Yes Pitting edema minimal Infections No Decreased scar mobility No   TREATMENT TODAY: 01/16/2022 Performed MLD to pts neck using [place] anterior approach as follows: short neck, 5 diaphragmatic breaths, bilateral axillary nodes, bilateral pectoral nodes, anterior chest, short neck, posterior neck moving fluid towards pathway aimed at lateral neck, then lateral  and anterior neck moving fluid towards pathway aimed at lateral neck then retracing all steps . Pt was then placed in sitting where he could see me. Pt performed an abbreviated version of MLD as seen in instructions leaving out the face and concentrating on the neck region then ending with LN's  01/09/2022: Began with deep breaths and neck and scapular ROM as able.  Encouraged pt to do shoulder ROM and reaching especially with right arm as able and to walk as much as posssible at home  Performed MLD to pts neck using Norton anterior approach as follows: short neck, 5 diaphragmatic breaths, bilateral axillary nodes, bilateral pectoral nodes, anterior chest, short neck, posterior neck moving fluid towards pathway aimed at lateral neck, then lateral and anterior neck moving fluid towards pathway aimed at lateral neck then retracing all steps . Talked with pt about  head and neck Flexitouch, but pt is not interested at this time.    01/04/2022 Performed MLD to pts neck using Norton anterior approach as follows: short neck, 5 diaphragmatic breaths, bilateral axillary nodes, bilateral pectoral nodes, anterior chest, short neck, posterior neck moving fluid towards pathway aimed at lateral neck, then lateral and anterior neck moving fluid towards pathway aimed at lateral neck then retracing all steps .   PATIENT EDUCATION:  encouraged pt to order garment doing exercise with use of [place] and scapular ROM especially opening mouth and sticking out tongue    ASSESSMENT:  CLINICAL IMPRESSION: Pt did order his new garment and it should arrive on Dec 1. Performed MLD on pt. Using [facility] Anterior approach. Fibrosis at neck did soften some with MLD. Instructed pt in sitting on abbreviated self  MLD with instructions in computer. Pt is not able to use left hand much due to stroke, but was able to do some of the basics with emphasis on the anterior and lateral neck. Left out face  instructions so pt could focus on neck region. He required multiple VC's and tactile cues but it was emphasized that even doing a little can help him.  Pt will benefit from skilled therapeutic intervention to improve on the following deficits: decreased knowledge of condition, increased edema, increased fascial restrictions, and postural dysfunction  PT treatment/interventions: ADL/Self care home management, Therapeutic exercises, Patient/Family education, Self Care, Orthotic/Fit training, Manual lymph drainage, Manual therapy, and Re-evaluation   GOALS Name Target Date (Remove Blue Hyperlink) Goal status  1 Pt will have proper compression garment to reduce cervical Lymphedema Baseline: Lost previous Son will order 03/01/2022 INITIAL  2 Pt will note softening of fibrosis in neck and reduction in swelling by 1-2 cm in neck region 03/01/2022 INITIAL  3 Pt will be educated in basic MLD steps for bilateral neck 03/01/2022 INITIAL  4        GOALS: Goals reviewed with patient? Yes  LONG TERM GOALS:  (STG=LTG)   PLAN:  PT FREQUENCY/DURATION: 1x/week ideally on the same day he sees SLP to save an extra trip x 8 weeks  PLAN FOR NEXT SESSION: MLD and instruct pt in basic techniques, assist with garment donning and fit, reoinforce exercise. measure weekly  Donato Heinz. Owens Shark, PT   Claris Pong, PT 01/16/2022, 10:46 AM

## 2022-01-17 ENCOUNTER — Ambulatory Visit: Payer: Medicare HMO

## 2022-01-19 ENCOUNTER — Telehealth: Payer: Self-pay | Admitting: *Deleted

## 2022-01-19 NOTE — Telephone Encounter (Signed)
Called patient to inform of CT for 02-02-22- arrival time- 2:45 pm @ [facility], no restrictions  to test, patient to receive results from Dr. Isidore Moos on 02/06/22 @ 2:20 pm, lvm for a return call

## 2022-01-24 ENCOUNTER — Ambulatory Visit: Payer: Medicare HMO | Attending: Radiation Oncology

## 2022-01-24 ENCOUNTER — Ambulatory Visit: Payer: Medicare HMO

## 2022-01-24 DIAGNOSIS — C01 Malignant neoplasm of base of tongue: Secondary | ICD-10-CM | POA: Insufficient documentation

## 2022-01-24 DIAGNOSIS — R293 Abnormal posture: Secondary | ICD-10-CM | POA: Insufficient documentation

## 2022-01-24 DIAGNOSIS — R1313 Dysphagia, pharyngeal phase: Secondary | ICD-10-CM | POA: Insufficient documentation

## 2022-01-24 DIAGNOSIS — I890 Lymphedema, not elsewhere classified: Secondary | ICD-10-CM | POA: Insufficient documentation

## 2022-01-24 DIAGNOSIS — L599 Disorder of the skin and subcutaneous tissue related to radiation, unspecified: Secondary | ICD-10-CM | POA: Diagnosis not present

## 2022-01-24 NOTE — Therapy (Signed)
OUTPATIENT PHYSICAL THERAPY HEAD AND NECK TREATMENT   Patient Name: Brian [name] MRN: [id number] DOB:04-27-1955, 66 y.o., male Today's Date: 01/24/2022   PT End of Session - 01/24/22 1458     Visit Number 4    Number of Visits 8    Date for PT Re-Evaluation 03/01/22    PT Start Time 1500    PT Stop Time 8144    PT Time Calculation (min) 45 min    Activity Tolerance Patient tolerated treatment well    Behavior During Therapy [facility] for tasks assessed/performed             Past Medical History:  Diagnosis Date   Arthritis    Atrial fibrillation ([place])    Cancer ([place])    SCC base of tongue 2021   Cardiomyopathy    Cerebrovascular accident ([place])    2012   Diabetes mellitus    Hyperlipidemia    Hypertension    Left-sided weakness    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 ([place])    Dr. Zigmund Daniel, laser therapy OD   Seizures ([place])    pt reports this is from low blood sugar   Past Surgical History:  Procedure Laterality Date   COLONOSCOPY     DIRECT LARYNGOSCOPY N/A 02/10/2020   Procedure: DIRECT LARYNGOSCOPY WITH BIOPSY;  Surgeon: [name], MD;  Location: [place] [place] [facility];  Service: ENT;  Laterality: N/A;   Ear surgery as a child     IR GASTROSTOMY TUBE MOD SED  03/23/2020   IR GASTROSTOMY TUBE REMOVAL  06/01/2020   IR IMAGING GUIDED PORT INSERTION  03/09/2020   IR REMOVAL TUN ACCESS W/ PORT W/O FL MOD SED  02/08/2021   TONSILLECTOMY     Patient Active Problem List   Diagnosis Date Noted   Chronic congestive heart failure with left ventricular diastolic dysfunction ([place]) 11/19/2021   History of CVA with residual deficit 11/03/2021   Dysphagia 11/03/2021   Fall at home, initial encounter 81/85/6314   Diastolic dysfunction 97/03/6376   Pancytopenia ([place]) 11/03/2021   Primary cancer of left lower lobe of lung ([place]) 06/13/2021   Normocytic normochromic anemia 03/20/2021   Stage 3b chronic kidney disease ([place]) 03/13/2021   Syncope and collapse 02/02/2021   Dilated cardiomyopathy  ([place]) 02/02/2021   Weight loss, unintentional 05/26/2020   Insomnia 05/09/2020   CKD (chronic kidney disease) 05/09/2020   Chemotherapy induced diarrhea 05/09/2020   Malignant neoplasm of base of tongue ([place]) 03/02/2020   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 ([place])    Status epilepticus ([place]) 01/18/2013   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 status epilepticus, not intractable ([place]) 01/15/2013   Hyperlipidemia    Long term current use of anticoagulant 03/11/2010   Diabetes mellitus, type II ([place]) 12/20/2009   Essential hypertension, benign 12/20/2009   Atrial fibrillation ([place]) 58/85/0277   Systolic heart failure ([place]) 12/20/2009   CEREBROVASCULAR ACCIDENT 12/20/2009    PCP: Jenna Luo, MD  REFERRING PROVIDER: [name], MD  REFERRING DIAG: Lymphedema post radiation  THERAPY DIAG:  Malignant neoplasm of base of tongue ([place])  Disorder of the skin and subcutaneous tissue related to radiation, unspecified  Abnormal posture  Lymphedema, not elsewhere classified  Rationale for Evaluation and Treatment: Rehabilitation  ONSET DATE: 05/04/2020  SUBJECTIVE:  SUBJECTIVE STATEMENT:  I got the garment a few days ago but I haven't tried it yet. I wanted you to try it first.  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, 02/26/20- PET hypermetabolic lesion at the right base of tongue and right tonsil consistent with oropharyngeal carcinoma, bulky bilateral hypermetabolic level 2 metastatic lymph nodes no evidence of metastatic disease in chest, abdomen or pelvis will receive 35 fractions of radiation to his base of tongue and bilateral neck with weekly cisplatin,  started radiation 1/24 and chemo on 1/31; PEG placed 2/2/222,   He completed chemo in March 2022 and  completed radiation to his base of tongue on 05/04/2020    Pt had a feeding tube but that is has been removed He is  see SLP due to having trouble with swallowing/ eating. Also had SBRT to left lung on 07/06/2021. Past history  a fib, major stroke about 10 yrs ago, diabetes   PATIENT GOALS:  PAIN:  Are you having pain? No head and neck lymphedema , CVA (left side affected),Diabetes, Afib, prior left shoulder dislocation with continued difficulty, CHF, CKD   OBJECTIVE:   POSTURE:  Significant Forward head and rounded shoulders posture  SHOULDER AROM:   WFL on right, deficits on left from CVA/dislocated shoulder  CERVICAL ROM; 50% limited with rotation, 60% with bilateral SB    LYMPHEDEMA ASSESSMENT:    Circumference in cm 01/24/2022  4 cm superior to sternal notch around neck 41.5 41.3  6 cm superior to sternal notch around neck 42.3 42.3  8 cm superior to sternal notch around neck 43.5 43.2  R lateral nostril from base of nose to medial tragus    L lateral nostril from base of nose to medial tragus    R corner of mouth to where ear lobe meets face    L corner of mouth to where ear lobe meets face            (Blank rows=not tested)  CURRENT/PAST TREATMENTS:  Surgery type/date: 02/09/2020 Direct laryngoscopy with biopsy  Chemotherapy: completed March 2022  Radiation: received 35 fractions of radiation to his base of tongue and bilateral neck, completed 05/04/2020   OTHER SYMPTOMS: Pain No Fibrosis Yes Pitting edema minimal Infections No Decreased scar mobility No   TREATMENT TODAY: 01/24/2022 Pts neck measured Performed MLD to pts neck using Norton anterior approach as follows: short neck, 5 diaphragmatic breaths, bilateral axillary nodes, bilateral pectoral nodes, anterior chest, short neck, posterior neck moving fluid towards pathway aimed at lateral neck, then lateral  and anterior neck moving fluid towards pathway aimed at lateral neck then retracing all steps  Pt shown how to don his new tribute neck garment and tried several times. He has some difficulty due to weakness in the left UE, but he was able to pull it up over his head and with VC's pull it down in front over his chin. The back strap with the finger hole was more difficult for him, but he thought his mother could help at home. He was advised to wear several hours at a time if comfortable to start with.   01/16/2022 Performed MLD to pts neck using Norton anterior approach as follows: short neck, 5 diaphragmatic breaths, bilateral axillary nodes, bilateral pectoral nodes, anterior chest, short neck, posterior neck moving fluid towards pathway aimed at lateral neck, then lateral and  anterior neck moving fluid towards pathway aimed at lateral neck then retracing all steps . Pt was then placed in sitting where he could see me. Pt performed an abbreviated version of MLD as seen in instructions leaving out the face and concentrating on the neck region then ending with LN's  01/09/2022: Began with deep breaths and neck and scapular ROM as able.  Encouraged pt to do shoulder ROM and reaching especially with right arm as able and to walk as much as posssible at home  Performed MLD to pts neck using Norton anterior approach as follows: short neck, 5 diaphragmatic breaths, bilateral axillary nodes, bilateral pectoral nodes, anterior chest, short neck, posterior neck moving fluid towards pathway aimed at lateral neck, then lateral and anterior neck moving fluid towards pathway aimed at lateral neck then retracing all steps . Talked with pt about head and neck Flexitouch, but pt is not interested at this time.    01/04/2022 Performed MLD to pts neck using Norton anterior approach as follows: short neck, 5 diaphragmatic breaths, bilateral axillary nodes, bilateral pectoral nodes, anterior chest, short neck, posterior  neck moving fluid towards pathway aimed at lateral neck, then lateral and anterior neck moving fluid towards pathway aimed at lateral neck then retracing all steps .   PATIENT EDUCATION:  encouraged pt to order garment doing exercise with use of [place] and scapular ROM especially opening mouth and sticking out tongue    ASSESSMENT:  CLINICAL IMPRESSION: Pt received his new garment and it fits well. He does require a little assistance with donning, and reaching the back strap, but he thinks his 68 year old mother should be able to help.Some softening of the fibrosis occurred after MLD. He should start to see more softening of the fibrosis after wearing his garment.  Pt will benefit from skilled therapeutic intervention to improve on the following deficits: decreased knowledge of condition, increased edema, increased fascial restrictions, and postural dysfunction  PT treatment/interventions: ADL/Self care home management, Therapeutic exercises, Patient/Family education, Self Care, Orthotic/Fit training, Manual lymph drainage, Manual therapy, and Re-evaluation    GOALS: Goals reviewed with patient? Yes  LONG TERM GOALS:  (STG=LTG)  GOALS Name Target Date (Remove Blue Hyperlink) Goal status  1 Pt will have proper compression garment to reduce cervical Lymphedema Baseline: Lost previous Son will order 03/01/2022 INITIAL  2 Pt will note softening of fibrosis in neck and reduction in swelling by 1-2 cm in neck region 03/01/2022 INITIAL  3 Pt will be educated in basic MLD steps for bilateral neck 03/01/2022 INITIAL  4         PLAN:  PT FREQUENCY/DURATION: 1x/week ideally on the same day he sees SLP to save an extra trip x 8 weeks  PLAN FOR NEXT SESSION: Advised pt to bring garment again next visit to problem solve if necessary and discuss wear time,MLD and instruct pt in basic techniques,  reinforce exercise. measure weekly     Claris Pong,  PT 01/24/2022, 3:49 PM

## 2022-01-24 NOTE — Therapy (Signed)
OUTPATIENT SPEECH LANGUAGE PATHOLOGY TREATMENT   Patient Name: Brian [name] MRN: [id number] DOB:Aug 18, 1955, 66 y.o., male Today's Date: 01/26/2022  PCP: Jenna Luo, MD REFERRING PROVIDER: [name], MD    End of Session - 01/26/22 1123     Visit Number 6    Number of Visits 21    Date for SLP Re-Evaluation 02/15/22    SLP Start Time 66    SLP Stop Time  5053    SLP Time Calculation (min) 40 min    Activity Tolerance Patient tolerated treatment well                 Past Medical History:  Diagnosis Date   Arthritis    Atrial fibrillation ([place])    Cancer ([place])    SCC base of tongue 2021   Cardiomyopathy    Cerebrovascular accident ([place])    2012   Diabetes mellitus    Hyperlipidemia    Hypertension    Left-sided weakness    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 ([place])    Dr. Zigmund Daniel, laser therapy OD   Seizures ([place])    pt reports this is from low blood sugar   Past Surgical History:  Procedure Laterality Date   COLONOSCOPY     DIRECT LARYNGOSCOPY N/A 02/10/2020   Procedure: DIRECT LARYNGOSCOPY WITH BIOPSY;  Surgeon: [name], MD;  Location: [facility];  Service: ENT;  Laterality: N/A;   Ear surgery as a child     IR GASTROSTOMY TUBE MOD SED  03/23/2020   IR GASTROSTOMY TUBE REMOVAL  06/01/2020   IR IMAGING GUIDED PORT INSERTION  03/09/2020   IR REMOVAL TUN ACCESS W/ PORT W/O FL MOD SED  02/08/2021   TONSILLECTOMY     Patient Active Problem List   Diagnosis Date Noted   Chronic congestive heart failure with left ventricular diastolic dysfunction ([place]) 11/19/2021   History of CVA with residual deficit 11/03/2021   Dysphagia 11/03/2021   Fall at home, initial encounter 97/67/3419   Diastolic dysfunction 37/90/2409   Pancytopenia ([place]) 11/03/2021   Primary cancer of left lower lobe of lung ([place]) 06/13/2021   Normocytic normochromic anemia 03/20/2021   Stage 3b chronic kidney disease ([place]) 03/13/2021   Syncope and collapse 02/02/2021   Dilated  cardiomyopathy ([place]) 02/02/2021   Weight loss, unintentional 05/26/2020   Insomnia 05/09/2020   CKD (chronic kidney disease) 05/09/2020   Chemotherapy induced diarrhea 05/09/2020   Malignant neoplasm of base of tongue ([place]) 03/02/2020   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 ([place])    Status epilepticus ([place]) 01/18/2013   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 status epilepticus, not intractable ([place]) 01/15/2013   Hyperlipidemia    Long term current use of anticoagulant 03/11/2010   Diabetes mellitus, type II ([place]) 12/20/2009   Essential hypertension, benign 12/20/2009   Atrial fibrillation ([place]) 73/53/2992   Systolic heart failure ([place]) 12/20/2009   CEREBROVASCULAR ACCIDENT 12/20/2009    ONSET DATE:  Winter 2022  REFERRING DIAG: C01 (ICD-10-CM) - Malignant neoplasm of base of tongue ([place])   THERAPY DIAG:  Dysphagia, pharyngeal phase  Rationale for Evaluation and Treatment Rehabilitation  SUBJECTIVE:   SUBJECTIVE STATEMENT: "Oh yeah" (asked if he has completed HEP >3 days/week)  Pt accompanied by: self  PERTINENT HISTORY: Pt seen for short course of ST in 2022 for prophylactic swallow exercises/therapy but pt did not schedule further ST after his 3rd visit. 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 Previous CVA 2011 which pt reportedly did not attend recommended OP therapies. CHF, CVA,  Afib, CKD, DM type 2.  PAIN:  Are you having pain? No  PATIENT GOALS: Swallow improvement  OBJECTIVE:   DIAGNOSTIC FINDINGS:  CT head 11-03-21 IMPRESSION: No acute intracranial abnormality. Chronic parietal lobe infarcts, right greater than left. Chronic left caudate infarct. Advanced chronic small vessel ischemic disease.  RECOMMENDATIONS FROM OBJECTIVE SWALLOW STUDY (MBSS/FEES):  Modified Barium Swallow Progress Note  Today's Date: 10/26/2021 Modified Barium Swallow   Brief recommendations include the following: Clinical Impression             Pt has a mild-moderate pharyngeal dysphagia suspected to be related  to post-radiation changes with what appears to be edematous tissue, particularly noted at the epiglottis and arytenoids. He has reduced base of tongue retraction, hyolaryngeal excursion, epiglottic inversion, and laryngeal vestibule closure. Because he does not achieve full closure, all liquids and even purees are penetrated in small amounts, but can be cleared well with a cued throat clear. Thin liquid penetrates reach the level of the true vocal folds and do fall below, with aspiration that is sensed as evidenced by a spontaneous throat clear (PAS 7). This throat clear does not adequately move aspirates, but when he is cued to clear his throat harder, he ejects them from his airway completely. Other compensatory strategies, such as a supraglottic swallow or chin tuck, do not increase airway protection. Pt has increased residue in his valleculae with thicker liquids and foods, with residue most significant when given a bite of a cereal bar. This stickier texture left severe residue in his valleculae (almost the entire bolus), but he still maintained good control of it by bringing it back up into his mouth and trying to re-swallow it. He ultimately used a liquid wash to clear most of this residue. This happened to be the only time during the study that pt had any subjective c/o residue, and it was the most significant. Education was provided to pt with daughter also present. Given that penetration is likely with thicker liquids (and aspiration possible across a meal), there is increased residue, and pt has remained stable from a pulmonary standpoint, recommend continuing with thin liquids in the setting of thorough oral care and implementation of a cough or more effortful throat clear post-swallow. Could consider making foods a little softer (mechanical soft) to facilitate pharyngeal clearance and subjective symptoms. Given that pt is reporting an acute change in swallowing, recommend f/u with OP SLP. Swallow  Evaluation Recommendations  SLP Diet Recommendations: Dysphagia 3 (Mech soft) solids;Thin liquid  Liquid Administration via: Cup;Straw  Medication Administration: Whole meds with puree  Supervision: Patient able to self feed  Compensations: Slow rate;Small sips/bites;Clear throat after each swallow;Hard cough after swallow  Postural Changes: Seated upright at 90 degrees  Oral Care Recommendations: Oral care BID   PATIENT REPORTED OUTCOME MEASURES (PROM): EAT-10: provided on 12/13/21.   TODAY'S TREATMENT:  01/24/22: Pt returned EAT-10 with score of 15/40 (lower scores indicative of better QOL). With dys III item and water today, pt followed precautions except that SLP had to cue pt for cough after each swallow. Pt complained of having to cough for every bite/sip and SLP reiterated the rationale for it. Pt voiced understanding ("I guess I don't want to end up in the hospital again.") Pt req'd SBA with HEP. SLP told pt if all cont to look good in next 1-2 visits, likely that d/c will occur.   01/10/22: "I forgot that damn paper again. I'm sorry. (PROM)." Pt to bring PROM next session. Pt  remains consistent with frequency of HEP. Procedure today was WNL. SLP asked about precautions: "I forget sometimes but not too often." Today pt req'd initial cues for smaller sips and throat clear/cough but then was indpendent after this. Pt c/o thickened saliva and told SLP he drinks very little water during the day. SLP suggested 60 oz/day. Pt will attempt this. He stated he does not want to a follow up MBS but will instead cont to follow precautions from this time forward. He reduced frequency today to every other week.   12-27-21: Pt states he has done HEP twice, maybe three or four times a day - "depends on what I'm doing that day." SLP reiterated goal to pt to have 20-30 reps done of each exercise/day. He acknowledged understanding. Procedure for HEP is WNL. He followed dysphagia precautions with POs with  initial reminder but did not require any more cues after this. Pt stated he was performing cough or throat clear and re-swallow with POs.  Pt has lymphedema eval 01-04-22. SLP stated if pt success continued he could reduce frequency starting next week.   12/21/21: Pt cont'd to present with what appears to be lymphedema, and asks SLP if he notices pt's left side of face "is bigger than this (right) side?". SLP inquired with referring MD about PT referral.  SLP req'd to provide cues for pt to cough/throat clear and reswallow with POs. He politely refused cereal bar and SLP provided pt peanut butter crackers which he said he ate every 3-4 weeks. Pt took one bite and said it was difficult to achieve pharyngeal clearance and refused subsequent bites. SLP told pt that this PO type was not on his recommended diet and confirmed with him that items you can mash with your fork would be recommended. Pt acknowledged these would be much easier for him to swallow.  He req'd min A rarely with HEP today. Told SLP he has been completing HEP "almost every day" except for CTAR due to not rolling up a towel yet. SLP strongly urged him to do this and provided guidance again on what 4" looked like.   12/13/21: Provided pt EAT-10 and he will return next session. Pt req's mod A faded to independence with effortful and MAsako, and SLP taught pt CTAR and Mendelsohn, which he req'd initial min-mod A faded to independence. Pt has completed effortful swallow but not Masako since last session but now knows he needs to complete all 4 exercises BID, and was explained rationale for this, as well as rationale for cough/throat clear and reswallow with EVERY bite or sip. "I'm taking smaller bites and sips for sure, buddy," pt told SLP.   12/06/21: SLP discussed the importance of performing HEP scope and frequency as prescribed, BID. SLP educated pt on the procedure for HEP exercises.   PATIENT EDUCATION: Education details: See "today's  treatment"    Person educated: Patient and Child(ren) Education method: Explanation, Demonstration, Verbal cues, and Handouts Education comprehension: verbalized understanding, returned demonstration, verbal cues required, and needs further education   ASSESSMENT:  CLINICAL IMPRESSION: Patient is a 66 y.o. male who is seen for swallow treatment to perform HEP, and adhere to precautions after MBSS 10-26-21. SEE TX NOTE from today. Pt has stated he does not want follow up MBS. If pt cont to perform HEP with modified independence/independence and follows precautions appropriately d/c may occur in  1-2 visits.  OBJECTIVE IMPAIRMENTS: include dysphagia. These impairments are limiting patient from safety when swallowing. Factors affecting potential  to achieve goals and functional outcome are cooperation/participation level and severity of impairments. Patient will benefit from skilled SLP services to address above impairments and improve overall function.  REHAB POTENTIAL: Fair due to severity and cooperation/participation.   GOALS: Goals reviewed with patient? Yes  SHORT TERM GOALS: Target date: 01/11/2022   Pt will follow precautions with POs in 3 sessions with rare min A Baseline:12-27-21,01-10-22 Goal status: ongoing  2.  Pt will complete HEP for swallowing with rare min A in 3 sessions Baseline:  Goal status: met - indpendent  3.  Pt will tell SLP 3 overt s/sx aspiration PNA with modified independence Baseline:  Goal status: met   LONG TERM GOALS: Target date: 02/15/2022    Pt's EAT-10 score will improve by last day of therapy Baseline:  Goal status: ongoing  2.  Pt will follow precautions with POs in 3 sessions with modified independence (mod I) Baseline: 01-24-22 Goal status: ongoing  3.  Pt will complete HEP for swallowing with mod I in 3 sessions Baseline: 01-10-22, 01-24-22 Goal status: ongoing  4.  Pt will undergo follow up modified barium swallow exam if/when he  completes 8 weeks of HEP, at least 6 days/week Baseline:  Goal status: deferred - pt stated he does not want another MBS   PLAN: SLP FREQUENCY: 1x/week  SLP DURATION: 10 weeks  PLANNED INTERVENTIONS: Aspiration precaution training, Pharyngeal strengthening exercises, Diet toleration management , Environmental controls, Trials of upgraded texture/liquids, Internal/external aids, Oral motor exercises, SLP instruction and feedback, Compensatory strategies, Patient/family education, and Re-evaluation    [place], [name] 01/26/2022, 11:23 AM

## 2022-01-30 ENCOUNTER — Ambulatory Visit: Payer: Self-pay | Admitting: *Deleted

## 2022-01-30 ENCOUNTER — Encounter: Payer: Self-pay | Admitting: *Deleted

## 2022-01-30 NOTE — Patient Outreach (Signed)
  Care Coordination   Follow Up Visit Note   01/30/2022  Name: Brian [name] MRN: [id number] DOB: [birth year]  Brian [name] is a 66 y.o. year old male who sees Pickard, Cammie Mcgee, MD for primary care. I spoke with Brian [name] by phone today.  What matters to the patients health and wellness today?   Receive Assistance Applying for [place] Medicaid and Placement into a [place].   Goals Addressed             This Visit's Progress    Receive Assistance Applying for [place] Medicaid and Placement into a [place].   On track    Care Coordination Interventions:  Solution-focused strategies developed. Task-centered interventions employed. Caregiver stress acknowledged. Caregiver support provided. Caregiver resources reviewed. Son applied for [place] Medicaid, through the [place].   ~ Special Assistance Long-Term Care Medicaid Application - pending.   Await approval/denial letter from the [place], Programme researcher, broadcasting/film/video for [place] Medicaid.  Son applied for Adult Medicaid, through the [place]. ~ Adult Medicaid Application - pending.   Await approval/denial letter from the [place], Programme researcher, broadcasting/film/video for Adult Medicaid. Son will continue to tour respite care agencies and facilities, assisted living facilities, rest homes and family care homes in [facility], in an effort to pursue long-term care placement for you in a memory care assisted living facility.   CSW collaboration with Primary Care Provider, Dr. Claretta Fraise, to request completed and signed FL-2 Form.  ~ HIPAA compliant message left, as CSW awaits a return call. CSW will fax completed and signed FL-2 Form  to all respite care agencies and facilities, assisted living facilities, rest homes and family care homes in [facility], of interest. Continue to receive outpatient physical therapy and speech/language therapy services, through [facility] at [facility].       SDOH assessments and interventions completed:  Yes.  Care Coordination Interventions:  Yes, provided.   Follow up plan: Follow up call scheduled for 02/14/2022 at 9:00 am.  Encounter Outcome:  Pt. Visit Completed.   Brian [name], BSW, MSW, [name]  Licensed Education officer, environmental [facility]  Mailing [place] [address] [place], [place] [address] Physical Address-[address], [place], [place] [address] Toll Free Main # [phone] Fax # [phone] Cell # [phone] [email or web address]

## 2022-01-30 NOTE — Patient Instructions (Signed)
Visit Information  Thank you for taking time to visit with me today. Please don't hesitate to contact me if I can be of assistance to you.   Following are the goals we discussed today:   Goals Addressed             This Visit's Progress    Receive Assistance Applying for [place] Medicaid and Placement into a [place].   On track    Care Coordination Interventions:  Solution-focused strategies developed. Task-centered interventions employed. Caregiver stress acknowledged. Caregiver support provided. Caregiver resources reviewed. Son applied for [place] Medicaid, through the [place].   ~ Special Assistance Long-Term Care Medicaid Application - pending.   Await approval/denial letter from the [place], Programme researcher, broadcasting/film/video for [place] Medicaid.  Son applied for Adult Medicaid, through the [place]. ~ Adult Medicaid Application - pending.   Await approval/denial letter from the [place], Programme researcher, broadcasting/film/video for Adult Medicaid. Son will continue to tour respite care agencies and facilities, assisted living facilities, rest homes and family care homes in [facility], in an effort to pursue long-term care placement for you in a memory care assisted living facility.   CSW collaboration with Primary Care Provider, Dr. Claretta Fraise, to request completed and signed FL-2 Form.  ~ HIPAA compliant message left, as CSW awaits a return call. CSW will fax completed and signed FL-2 Form to all respite care agencies and facilities, assisted living facilities, rest homes and family care homes in [facility], of interest. Continue to receive outpatient physical therapy and speech/language therapy services, through [facility] at [facility].       Our next appointment is by telephone on 02/14/2022 at 9:00 am.  Please call the care guide team at [phone] if you need to cancel or reschedule your appointment.   If you are experiencing a Mental Health or [place] or need someone to talk to, please call the Suicide and Crisis Lifeline: 988 call the [place] National Suicide Prevention Lifeline: 506-154-5911 or TTY: 970-864-0845 TTY 801-733-5504) to talk to a trained counselor call 1-800-273-TALK (toll free, 24 hour hotline) go to [facility] [address] [place] [phone]) call the [place]: [phone] call 911  Patient verbalizes understanding of instructions and care plan provided today and agrees to view in [place]. Active MyChart status and patient understanding of how to access instructions and care plan via MyChart confirmed with patient.     Telephone follow up appointment with care management team member scheduled for:  02/14/2022 at 9:00 am.  Nat Christen, BSW, MSW, [place]  Licensed Clinical Social Worker  [place]  Mailing [place]. [address] [place], [place] [address] Physical Address-[address] [place], [place] [address] Toll Free Main # [phone] Fax # [phone] Cell # [phone] [email or web address]

## 2022-01-30 NOTE — Progress Notes (Signed)
[name] presents today following completion of radiation treatment to his tongue. He completed radiation therapy on 05-04-20. We will review CT results with pt on this visit.   Pain issues, if any: no Using a feeding tube?: no Weight changes, if any: reports eating well Wt Readings from Last 3 Encounters:  02/06/22 166 lb 6.4 oz (75.5 kg)  12/26/21 175 lb 6.4 oz (79.6 kg)  11/21/21 190 lb 6.4 oz (86.4 kg)    Swallowing issues, if any: yes, big bites are difficult, getting therapy for this Smoking or chewing tobacco? no Using fluoride trays daily? no Last ENT visit was on: two or three weeks, he did a scope and it was clean Other notable issues, if any:no major concerns or questions, reports good healing overall. Issues with neck swelling and lymph nodes, neck is wrapped with compression band.   Vitals:   02/06/22 1413 02/06/22 1418  BP: (!) 171/112 (!) 162/129  Pulse: 90 90  Resp: 20 18  Temp: (!) 97.5 F (36.4 C) (!) 97.5 F (36.4 C)  SpO2: 98% 100%

## 2022-01-31 ENCOUNTER — Encounter: Payer: Self-pay | Admitting: Physical Therapy

## 2022-01-31 ENCOUNTER — Ambulatory Visit: Payer: Medicare HMO | Admitting: Physical Therapy

## 2022-01-31 DIAGNOSIS — N1832 Chronic kidney disease, stage 3b: Secondary | ICD-10-CM | POA: Diagnosis not present

## 2022-01-31 DIAGNOSIS — I1A0 Resistant hypertension: Secondary | ICD-10-CM | POA: Diagnosis not present

## 2022-01-31 DIAGNOSIS — I890 Lymphedema, not elsewhere classified: Secondary | ICD-10-CM

## 2022-01-31 DIAGNOSIS — R600 Localized edema: Secondary | ICD-10-CM | POA: Diagnosis not present

## 2022-01-31 DIAGNOSIS — E1122 Type 2 diabetes mellitus with diabetic chronic kidney disease: Secondary | ICD-10-CM | POA: Diagnosis not present

## 2022-01-31 DIAGNOSIS — E785 Hyperlipidemia, unspecified: Secondary | ICD-10-CM | POA: Diagnosis not present

## 2022-01-31 DIAGNOSIS — C01 Malignant neoplasm of base of tongue: Secondary | ICD-10-CM | POA: Diagnosis not present

## 2022-01-31 DIAGNOSIS — L599 Disorder of the skin and subcutaneous tissue related to radiation, unspecified: Secondary | ICD-10-CM | POA: Diagnosis not present

## 2022-01-31 DIAGNOSIS — R293 Abnormal posture: Secondary | ICD-10-CM | POA: Diagnosis not present

## 2022-01-31 NOTE — Therapy (Signed)
OUTPATIENT PHYSICAL THERAPY HEAD AND NECK TREATMENT   Patient Name: Brian [name] MRN: [id number] DOB:02-Feb-1956, 66 y.o., male Today's Date: 01/31/2022   PT End of Session - 01/31/22 1648     Visit Number 5    Number of Visits 8    Date for PT Re-Evaluation 03/01/22    PT Start Time 1600    PT Stop Time 1645    PT Time Calculation (min) 45 min    Activity Tolerance Patient tolerated treatment well    Behavior During Therapy [facility] for tasks assessed/performed             Past Medical History:  Diagnosis Date   Arthritis    Atrial fibrillation ([place])    Cancer ([place])    SCC base of tongue 2021   Cardiomyopathy    Cerebrovascular accident ([place])    2012   Diabetes mellitus    Hyperlipidemia    Hypertension    Left-sided weakness    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 ([place])    Dr. Zigmund Daniel, laser therapy OD   Seizures ([place])    pt reports this is from low blood sugar   Past Surgical History:  Procedure Laterality Date   COLONOSCOPY     DIRECT LARYNGOSCOPY N/A 02/10/2020   Procedure: DIRECT LARYNGOSCOPY WITH BIOPSY;  Surgeon: [name], MD;  Location: [place] OR;  Service: ENT;  Laterality: N/A;   Ear surgery as a child     IR GASTROSTOMY TUBE MOD SED  03/23/2020   IR GASTROSTOMY TUBE REMOVAL  06/01/2020   IR IMAGING GUIDED PORT INSERTION  03/09/2020   IR REMOVAL TUN ACCESS W/ PORT W/O FL MOD SED  02/08/2021   TONSILLECTOMY     Patient Active Problem List   Diagnosis Date Noted   Chronic congestive heart failure with left ventricular diastolic dysfunction ([place]) 11/19/2021   History of CVA with residual deficit 11/03/2021   Dysphagia 11/03/2021   Fall at home, initial encounter 40/98/1191   Diastolic dysfunction 47/82/9562   Pancytopenia ([place]) 11/03/2021   Primary cancer of left lower lobe of lung ([place]) 06/13/2021   Normocytic normochromic anemia 03/20/2021   Stage 3b chronic kidney disease ([place]) 03/13/2021   Syncope and collapse 02/02/2021   Dilated  cardiomyopathy ([place]) 02/02/2021   Weight loss, unintentional 05/26/2020   Insomnia 05/09/2020   CKD (chronic kidney disease) 05/09/2020   Chemotherapy induced diarrhea 05/09/2020   Malignant neoplasm of base of tongue ([place]) 03/02/2020   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 [facility])    Status epilepticus ([place]) 01/18/2013   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  Rationale for Evaluation and Treatment: Rehabilitation  ONSET DATE: 05/04/2020  SUBJECTIVE:  SUBJECTIVE STATEMENT:  I can't get this garment on.   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, 02/26/20- PET hypermetabolic lesion at the right base of tongue and right tonsil consistent with oropharyngeal carcinoma, bulky bilateral hypermetabolic level 2 metastatic lymph nodes no evidence of metastatic disease in chest, abdomen or pelvis will receive 35 fractions of radiation to his base of tongue and bilateral neck with weekly cisplatin, started radiation 1/24 and chemo on 1/31; PEG placed 2/2/222,   He completed chemo in March 2022 and  completed radiation to his base of tongue on 05/04/2020    Pt had a feeding tube but that is has been removed He  is  see SLP due to having trouble with swallowing/ eating. Also had SBRT to left lung on 07/06/2021. Past history  a fib, major stroke about 10 yrs ago, diabetes   PATIENT GOALS:  PAIN:  Are you having pain? No head and neck lymphedema , CVA (left side affected),Diabetes, Afib, prior left shoulder dislocation with continued difficulty, CHF, CKD   OBJECTIVE:   POSTURE:  Significant Forward head and rounded shoulders posture  SHOULDER AROM:   WFL on right, deficits on left from CVA/dislocated shoulder  CERVICAL ROM; 50% limited with rotation, 60% with bilateral SB    LYMPHEDEMA ASSESSMENT:    Circumference in cm 01/24/2022  4 cm superior to sternal notch around neck 41.5 41.3  6 cm superior to sternal notch around neck 42.3 42.3  8 cm superior to sternal notch around neck 43.5 43.2  R lateral nostril from base of nose to medial tragus    L lateral nostril from base of nose to medial tragus    R corner of mouth to where ear lobe meets face    L corner of mouth to where ear lobe meets face            (Blank rows=not tested)  CURRENT/PAST TREATMENTS:  Surgery type/date: 02/09/2020 Direct laryngoscopy with biopsy  Chemotherapy: completed March 2022  Radiation: received 35 fractions of radiation to his base of tongue and bilateral neck, completed 05/04/2020   OTHER SYMPTOMS: Pain No Fibrosis Yes Pitting edema minimal Infections No Decreased scar mobility No   TREATMENT TODAY: 01/31/2022 Pts neck measured Performed MLD to pts neck using Norton anterior approach as follows: short neck, 5 diaphragmatic breaths, bilateral axillary nodes, bilateral pectoral nodes, anterior chest, short neck, posterior neck moving fluid towards pathway aimed at lateral neck, then lateral and anterior neck moving fluid towards pathway aimed at lateral neck then focused on bilateral cheeks and mandibular region moving fluid towards pathways then retracing all steps Had pt attempt to don his head and  neck garment but pt reported his was unable to do. He attempted but had difficulty. Since the majority of the swelling is in his cheeks he was instructed to just don the strap on top of his head to help him don with less difficulty. Educated pt to spend increased time on cheeks with massage.   01/24/2022 Pts neck measured Performed MLD to pts neck using Norton anterior approach as follows: short neck, 5 diaphragmatic breaths, bilateral axillary nodes, bilateral pectoral nodes, anterior chest, short neck, posterior neck moving fluid towards pathway aimed at lateral neck, then lateral and anterior neck moving fluid towards pathway aimed at lateral neck then retracing all steps  Pt shown how to don  his new tribute [employer] and tried several times. He has some difficulty due to weakness in the left UE, but he was able to pull it up over his head and with VC's pull it down in front over his chin. The back strap with the finger hole was more difficult for him, but he thought his mother could help at home. He was advised to wear several hours at a time if comfortable to start with.   01/16/2022 Performed MLD to pts neck using Norton anterior approach as follows: short neck, 5 diaphragmatic breaths, bilateral axillary nodes, bilateral pectoral nodes, anterior chest, short neck, posterior neck moving fluid towards pathway aimed at lateral neck, then lateral and anterior neck moving fluid towards pathway aimed at lateral neck then retracing all steps . Pt was then placed in sitting where he could see me. Pt performed an abbreviated version of MLD as seen in instructions leaving out the face and concentrating on the neck region then ending with LN's  01/09/2022: Began with deep breaths and neck and scapular ROM as able.  Encouraged pt to do shoulder ROM and reaching especially with right arm as able and to walk as much as posssible at home  Performed MLD to pts neck using Norton anterior approach as follows:  short neck, 5 diaphragmatic breaths, bilateral axillary nodes, bilateral pectoral nodes, anterior chest, short neck, posterior neck moving fluid towards pathway aimed at lateral neck, then lateral and anterior neck moving fluid towards pathway aimed at lateral neck then retracing all steps . Talked with pt about head and neck Flexitouch, but pt is not interested at this time.    01/04/2022 Performed MLD to pts neck using Norton anterior approach as follows: short neck, 5 diaphragmatic breaths, bilateral axillary nodes, bilateral pectoral nodes, anterior chest, short neck, posterior neck moving fluid towards pathway aimed at lateral neck, then lateral and anterior neck moving fluid towards pathway aimed at lateral neck then retracing all steps .   PATIENT EDUCATION:  encouraged pt to order garment doing exercise with use of [place] and scapular ROM especially opening mouth and sticking out tongue    ASSESSMENT:  CLINICAL IMPRESSION: Pt is having difficulty donning his compression garment. He was unable to demonstrate independence with donning this session and requested to just practice at home in front of the mirror. Educated pt he could leave strap behind neck open to ease donning since majority of edema is at his cheeks and not in his neck. Educated pt to try to spend more time on self MLD focused on cheeks.   Pt will benefit from skilled therapeutic intervention to improve on the following deficits: decreased knowledge of condition, increased edema, increased fascial restrictions, and postural dysfunction  PT treatment/interventions: ADL/Self care home management, Therapeutic exercises, Patient/Family education, Self Care, Orthotic/Fit training, Manual lymph drainage, Manual therapy, and Re-evaluation    GOALS: Goals reviewed with patient? Yes  LONG TERM GOALS:  (STG=LTG)  GOALS Name Target Date (Remove Blue Hyperlink) Goal status  1 Pt will have proper  compression garment to reduce cervical Lymphedema Baseline: Lost previous Son will order 03/01/2022 INITIAL  2 Pt will note softening of fibrosis in neck and reduction in swelling by 1-2 cm in neck region 03/01/2022 INITIAL  3 Pt will be educated in basic MLD steps for bilateral neck 03/01/2022 INITIAL  4         PLAN:  PT FREQUENCY/DURATION: 1x/week ideally on the same day he sees SLP to save  an extra trip x 8 weeks  PLAN FOR NEXT SESSION: Advised pt to bring garment again next visit to problem solve if necessary and discuss wear time,MLD with focus on cheeks- add additional measurements in table to account for cheek edema, reinforce exercise. measure weekly     Manus Gunning, PT 01/31/2022, 4:55 PM

## 2022-02-02 ENCOUNTER — Ambulatory Visit (HOSPITAL_COMMUNITY)
Admission: RE | Admit: 2022-02-02 | Discharge: 2022-02-02 | Disposition: A | Discharge status: Home / Self Care | Payer: Medicare HMO | Source: Ambulatory Visit | Attending: Radiation Oncology | Admitting: Radiation Oncology

## 2022-02-02 DIAGNOSIS — C01 Malignant neoplasm of base of tongue: Secondary | ICD-10-CM | POA: Insufficient documentation

## 2022-02-02 DIAGNOSIS — J384 Edema of larynx: Secondary | ICD-10-CM | POA: Diagnosis not present

## 2022-02-02 DIAGNOSIS — C3432 Malignant neoplasm of lower lobe, left bronchus or lung: Secondary | ICD-10-CM | POA: Insufficient documentation

## 2022-02-02 DIAGNOSIS — J392 Other diseases of pharynx: Secondary | ICD-10-CM | POA: Diagnosis not present

## 2022-02-02 DIAGNOSIS — Z85810 Personal history of malignant neoplasm of tongue: Secondary | ICD-10-CM | POA: Diagnosis not present

## 2022-02-02 DIAGNOSIS — J90 Pleural effusion, not elsewhere classified: Secondary | ICD-10-CM | POA: Diagnosis not present

## 2022-02-02 DIAGNOSIS — I6523 Occlusion and stenosis of bilateral carotid arteries: Secondary | ICD-10-CM | POA: Diagnosis not present

## 2022-02-02 DIAGNOSIS — R918 Other nonspecific abnormal finding of lung field: Secondary | ICD-10-CM | POA: Diagnosis not present

## 2022-02-02 DIAGNOSIS — C3490 Malignant neoplasm of unspecified part of unspecified bronchus or lung: Secondary | ICD-10-CM | POA: Diagnosis not present

## 2022-02-06 ENCOUNTER — Ambulatory Visit: Payer: Medicare HMO

## 2022-02-06 ENCOUNTER — Encounter: Payer: Self-pay | Admitting: Radiation Oncology

## 2022-02-06 ENCOUNTER — Ambulatory Visit
Admission: RE | Admit: 2022-02-06 | Discharge: 2022-02-06 | Disposition: A | Discharge status: Home / Self Care | Payer: Medicare HMO | Source: Ambulatory Visit | Attending: Radiation Oncology | Admitting: Radiation Oncology

## 2022-02-06 VITALS — BP 162/129 | HR 90 | Temp 97.5°F | Resp 18 | Ht 69.0 in | Wt 166.4 lb

## 2022-02-06 DIAGNOSIS — L599 Disorder of the skin and subcutaneous tissue related to radiation, unspecified: Secondary | ICD-10-CM | POA: Diagnosis not present

## 2022-02-06 DIAGNOSIS — K449 Diaphragmatic hernia without obstruction or gangrene: Secondary | ICD-10-CM | POA: Insufficient documentation

## 2022-02-06 DIAGNOSIS — C01 Malignant neoplasm of base of tongue: Secondary | ICD-10-CM

## 2022-02-06 DIAGNOSIS — I3139 Other pericardial effusion (noninflammatory): Secondary | ICD-10-CM | POA: Insufficient documentation

## 2022-02-06 DIAGNOSIS — I890 Lymphedema, not elsewhere classified: Secondary | ICD-10-CM

## 2022-02-06 DIAGNOSIS — R293 Abnormal posture: Secondary | ICD-10-CM | POA: Diagnosis not present

## 2022-02-06 DIAGNOSIS — M47814 Spondylosis without myelopathy or radiculopathy, thoracic region: Secondary | ICD-10-CM | POA: Insufficient documentation

## 2022-02-06 DIAGNOSIS — Z923 Personal history of irradiation: Secondary | ICD-10-CM | POA: Insufficient documentation

## 2022-02-06 DIAGNOSIS — C3432 Malignant neoplasm of lower lobe, left bronchus or lung: Secondary | ICD-10-CM | POA: Diagnosis not present

## 2022-02-06 DIAGNOSIS — Z9221 Personal history of antineoplastic chemotherapy: Secondary | ICD-10-CM | POA: Diagnosis not present

## 2022-02-06 DIAGNOSIS — Z85810 Personal history of malignant neoplasm of tongue: Secondary | ICD-10-CM | POA: Insufficient documentation

## 2022-02-06 DIAGNOSIS — Z7984 Long term (current) use of oral hypoglycemic drugs: Secondary | ICD-10-CM | POA: Diagnosis not present

## 2022-02-06 DIAGNOSIS — Z79899 Other long term (current) drug therapy: Secondary | ICD-10-CM | POA: Diagnosis not present

## 2022-02-06 DIAGNOSIS — Z85118 Personal history of other malignant neoplasm of bronchus and lung: Secondary | ICD-10-CM | POA: Diagnosis not present

## 2022-02-06 NOTE — Progress Notes (Signed)
Oncology Nurse Navigator Documentation   I met with Brian [name] today before he saw Dr. Isidore Moos. He is doing well. He's wearing his neck lymphedema compression device today without difficulty. He knows to call me if he has any needs or questions anytime in the future.   Harlow Asa RN, BSN, OCN Head & Neck Oncology Nurse [place] [facility] Phone # [phone]  Fax # [phone]

## 2022-02-06 NOTE — Therapy (Signed)
OUTPATIENT PHYSICAL THERAPY HEAD AND NECK TREATMENT   Patient Name: Brian [name] MRN: [id number] DOB:[birth year], 66 y.o., male Today's Date: 02/06/2022   PT End of Session - 02/06/22 1015     Visit Number 6    Number of Visits 8    Date for PT Re-Evaluation 03/01/22    PT Start Time 1010    PT Stop Time 1103    PT Time Calculation (min) 53 min    Activity Tolerance Patient tolerated treatment well    Behavior During Therapy [facility] for tasks assessed/performed             Past Medical History:  Diagnosis Date   Arthritis    Atrial fibrillation ([place])    Cancer ([place])    SCC base of tongue 2021   Cardiomyopathy    Cerebrovascular accident ([place])    2012   Diabetes mellitus    Hyperlipidemia    Hypertension    Left-sided weakness    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 ([place])    Dr. Zigmund Daniel, laser therapy OD   Seizures ([place])    pt reports this is from low blood sugar   Past Surgical History:  Procedure Laterality Date   COLONOSCOPY     DIRECT LARYNGOSCOPY N/A 02/10/2020   Procedure: DIRECT LARYNGOSCOPY WITH BIOPSY;  Surgeon: [name], MD;  Location: [facility] OR;  Service: ENT;  Laterality: N/A;   Ear surgery as a child     IR GASTROSTOMY TUBE MOD SED  03/23/2020   IR GASTROSTOMY TUBE REMOVAL  06/01/2020   IR IMAGING GUIDED PORT INSERTION  03/09/2020   IR REMOVAL TUN ACCESS W/ PORT W/O FL MOD SED  02/08/2021   TONSILLECTOMY     Patient Active Problem List   Diagnosis Date Noted   Chronic congestive heart failure with left ventricular diastolic dysfunction ([place]) 11/19/2021   History of CVA with residual deficit 11/03/2021   Dysphagia 11/03/2021   Fall at home, initial encounter 37/48/2707   Diastolic dysfunction 86/75/4492   Pancytopenia ([place]) 11/03/2021   Primary cancer of left lower lobe of lung ([place]) 06/13/2021   Normocytic normochromic anemia 03/20/2021   Stage 3b chronic kidney disease ([place]) 03/13/2021   Syncope and collapse 02/02/2021   Dilated  cardiomyopathy ([place]) 02/02/2021   Weight loss, unintentional 05/26/2020   Insomnia 05/09/2020   CKD (chronic kidney disease) 05/09/2020   Chemotherapy induced diarrhea 05/09/2020   Malignant neoplasm of base of tongue ([place]) 03/02/2020   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 [facility])    Status epilepticus ([place]) 01/18/2013   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 status epilepticus, not intractable ([place]) 01/15/2013   Hyperlipidemia    Long term current use of anticoagulant 03/11/2010   Diabetes mellitus, type II ([place]) 12/20/2009   Essential hypertension, benign 12/20/2009   Atrial fibrillation ([place]) 01/00/7121   Systolic heart failure ([place]) 12/20/2009   CEREBROVASCULAR ACCIDENT 12/20/2009    PCP: Jenna Luo, MD  REFERRING PROVIDER: [name], MD  REFERRING DIAG: Lymphedema post radiation  THERAPY DIAG:  Lymphedema, not elsewhere classified  Malignant neoplasm of base of tongue ([place])  Rationale for Evaluation and Treatment: Rehabilitation  ONSET DATE: 05/04/2020  SUBJECTIVE:  SUBJECTIVE STATEMENT:  I've been wearing the garment some and I think it does help make my cheek softer after I wear it for a while. And I'm doing the self MLD as best I can.   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, 02/26/20- PET hypermetabolic lesion at the right base of tongue and right tonsil consistent with oropharyngeal carcinoma, bulky bilateral hypermetabolic level 2 metastatic lymph nodes no evidence of metastatic disease in chest, abdomen or pelvis will receive 35 fractions of radiation to his base of tongue and bilateral neck with weekly cisplatin, started radiation 1/24 and  chemo on 1/31; PEG placed 2/2/222,   He completed chemo in March 2022 and  completed radiation to his base of tongue on 05/04/2020    Pt had a feeding tube but that is has been removed He is  see SLP due to having trouble with swallowing/ eating. Also had SBRT to left lung on 07/06/2021. Past history  a fib, major stroke about 10 yrs ago, diabetes   PATIENT GOALS:  PAIN:  Are you having pain? No head and neck lymphedema , CVA (left side affected),Diabetes, Afib, prior left shoulder dislocation with continued difficulty, CHF, CKD   OBJECTIVE:   POSTURE:  Significant Forward head and rounded shoulders posture  SHOULDER AROM:   WFL on right, deficits on left from CVA/dislocated shoulder  CERVICAL ROM; 50% limited with rotation, 60% with bilateral SB    LYMPHEDEMA ASSESSMENT:    Circumference in cm 01/24/2022 02/06/2022  4 cm superior to sternal notch around neck 41.5 41.3 41  6 cm superior to sternal notch around neck 42.3 42.3 42.4  8 cm superior to sternal notch around neck 43.5 43.2 43.2  R lateral nostril from base of nose to medial tragus     L lateral nostril from base of nose to medial tragus     R corner of mouth to where ear lobe meets face     L corner of mouth to where ear lobe meets face               (Blank rows=not tested)  CURRENT/PAST TREATMENTS:  Surgery type/date: 02/09/2020 Direct laryngoscopy with biopsy  Chemotherapy: completed March 2022  Radiation: received 35 fractions of radiation to his base of tongue and bilateral neck, completed 05/04/2020   OTHER SYMPTOMS: Pain No Fibrosis Yes Pitting edema minimal Infections No Decreased scar mobility No   TREATMENT TODAY: 02/06/2022: Manual Therapy Pts neck measured Reviewed donning compression garment with pt. He was letting Top of Head strap slide to the back of his neck and this was causing him some confusion. After demonstrating proper placement with him he was able to verbalize better understanding of  donning technique and seemed less frustrated by this by end of session. He also wanted to wear this out at end of session so he was able to re don this again after MLD.   Performed MLD to pts neck using Norton anterior approach as follows: short neck, bil shoulder collectors, bilateral axillary nodes, bil anterior chest, superficial and deep abdominals, short neck, lateral neck, anterior throat and submandibular region, bilateral cheeks moving fluid towards pathways, then pre-and retroauricular nodes and suboccipital nodes moving fluid towards pathway aimed at lateral neck, then retracing all steps reviewing light pressure with pt throughout  01/31/2022 Pts neck measured Performed MLD to pts neck using  Norton anterior approach as follows: short neck, 5 diaphragmatic breaths, bilateral axillary nodes, bilateral pectoral nodes, anterior chest, short neck, posterior neck moving fluid towards pathway aimed at lateral neck, then lateral and anterior neck moving fluid towards pathway aimed at lateral neck then focused on bilateral cheeks and mandibular region moving fluid towards pathways then retracing all steps Had pt attempt to don his head and neck garment but pt reported his was unable to do. He attempted but had difficulty. Since the majority of the swelling is in his cheeks he was instructed to just don the strap on top of his head to help him don with less difficulty. Educated pt to spend increased time on cheeks with massage.   01/24/2022 Pts neck measured Performed MLD to pts neck using Norton anterior approach as follows: short neck, 5 diaphragmatic breaths, bilateral axillary nodes, bilateral pectoral nodes, anterior chest, short neck, posterior neck moving fluid towards pathway aimed at lateral neck, then lateral and anterior neck moving fluid towards pathway aimed at lateral neck then retracing all steps  Pt shown how to don his new tribute neck garment and tried several times. He has some  difficulty due to weakness in the left UE, but he was able to pull it up over his head and with VC's pull it down in front over his chin. The back strap with the finger hole was more difficult for him, but he thought his mother could help at home. He was advised to wear several hours at a time if comfortable to start with.   01/16/2022 Performed MLD to pts neck using Norton anterior approach as follows: short neck, 5 diaphragmatic breaths, bilateral axillary nodes, bilateral pectoral nodes, anterior chest, short neck, posterior neck moving fluid towards pathway aimed at lateral neck, then lateral and anterior neck moving fluid towards pathway aimed at lateral neck then retracing all steps . Pt was then placed in sitting where he could see me. Pt performed an abbreviated version of MLD as seen in instructions leaving out the face and concentrating on the neck region then ending with LN's  01/09/2022: Began with deep breaths and neck and scapular ROM as able.  Encouraged pt to do shoulder ROM and reaching especially with right arm as able and to walk as much as posssible at home  Performed MLD to pts neck using Norton anterior approach as follows: short neck, 5 diaphragmatic breaths, bilateral axillary nodes, bilateral pectoral nodes, anterior chest, short neck, posterior neck moving fluid towards pathway aimed at lateral neck, then lateral and anterior neck moving fluid towards pathway aimed at lateral neck then retracing all steps . Talked with pt about head and neck Flexitouch, but pt is not interested at this time.    01/04/2022 Performed MLD to pts neck using Norton anterior approach as follows: short neck, 5 diaphragmatic breaths, bilateral axillary nodes, bilateral pectoral nodes, anterior chest, short neck, posterior neck moving fluid towards pathway aimed at lateral neck, then lateral and anterior neck moving fluid towards pathway aimed at lateral neck then retracing all steps .   PATIENT  EDUCATION:  encouraged pt to order garment doing exercise with use of [place] and scapular ROM especially opening mouth and sticking out tongue    ASSESSMENT:  CLINICAL IMPRESSION: Pt came in reporting he had started to have some success with donning his garment at home but still had some frustration with getting it on as he has yet to feel consistent so reviewed this with him  again today. He was allowing Top of Head strap to slide to back of his neck which is what was causing his confusion. After review of where this strap should be pt was better able to don this with less frustration. Continued with MLD reviewing correct pressure while therapist performed. His circumference measurements were not much changed from last session. Pt would like to return in 2 weeks for another assess and remeasure of his circumference at that time so cancelled his next week appt and he will return 02/22/22. Also encouraged him to work on daily walking routine and being consistent with this.   Pt will benefit from skilled therapeutic intervention to improve on the following deficits: decreased knowledge of condition, increased edema, increased fascial restrictions, and postural dysfunction  PT treatment/interventions: ADL/Self care home management, Therapeutic exercises, Patient/Family education, Self Care, Orthotic/Fit training, Manual lymph drainage, Manual therapy, and Re-evaluation    GOALS: Goals reviewed with patient? Yes  LONG TERM GOALS:  (STG=LTG)  GOALS Name Target Date (Remove Blue Hyperlink) Goal status  1 Pt will have proper compression garment to reduce cervical Lymphedema Baseline: Lost previous Son will order 03/01/2022 INITIAL  2 Pt will note softening of fibrosis in neck and reduction in swelling by 1-2 cm in neck region 03/01/2022 INITIAL  3 Pt will be educated in basic MLD steps for bilateral neck 03/01/2022 INITIAL  4         PLAN:  PT FREQUENCY/DURATION:  1x/week ideally on the same day he sees SLP to save an extra trip x 8 weeks  PLAN FOR NEXT SESSION: How is donning garment going? MLD with focus on cheeks- add additional measurements in table to account for cheek edema, reinforce exercise. measure weekly     [name], PTA 02/06/2022, 12:31 PM

## 2022-02-06 NOTE — Progress Notes (Signed)
Radiation Oncology         [phone] ________________________________  Name: Brian [name] MRN: [id number]  Date: 02/06/2022  DOB: [birth year]  Follow-Up Visit Note  CC: Brian Frizzle, MD  Brian Frizzle, MD  Diagnosis and Prior Radiotherapy:       ICD-10-CM   1. Malignant neoplasm of base of tongue ([place])  C01 CT Chest Wo Contrast    CT Soft Tissue Neck Wo Contrast       Cancer Staging  Malignant neoplasm of base of tongue ([place]) Staging form: Pharynx - HPV-Mediated Oropharynx, AJCC 8th Edition - Clinical stage from 03/01/2020: Stage III (cT2, cN3, cM0, p16+) - Signed by [name], MD on 03/02/2020 Stage prefix: Initial diagnosis  Radiation Treatment Dates: 03/14/2020 through 05/04/2020 Site Technique Total Dose (Gy) Dose per Fx (Gy) Completed Fx Beam Energies  Neck: HN_BOT IMRT 70/70 2 35/35 6X   From 06/21/2021 to 07/06/2021 he received 60 Gray in 5 fractions, stereotactic body radiation therapy, to left lower lobe lung mass  CHIEF COMPLAINT: Swallowing issues  Narrative:    [name] presents today following completion of radiation treatment to his tongue and later his lung. We will review CT results with pt on this visit.   Pain issues, if any: no Using a feeding tube?: no Weight changes, if any: reports eating well Wt Readings from Last 3 Encounters:  02/06/22 166 lb 6.4 oz (75.5 kg)  12/26/21 175 lb 6.4 oz (79.6 kg)  11/21/21 190 lb 6.4 oz (86.4 kg)    Swallowing issues, if any: yes, big bites are difficult, getting therapy for this Smoking or chewing tobacco? no Using fluoride trays daily? no Last ENT visit was on: two or three weeks, he did a scope and it was clean Other notable issues, if any:no major concerns or questions, reports good healing overall. Issues with neck swelling and lymph nodes, neck is wrapped with compression band.    ALLERGIES:  is allergic to codeine and metformin and related.  Meds: Current Outpatient Medications   Medication Sig Dispense Refill   apixaban (ELIQUIS) 5 MG TABS tablet Take 1 tablet (5 mg total) by mouth 2 (two) times daily. 60 tablet 5   atorvastatin (LIPITOR) 20 MG tablet TAKE 1 TABLET BY MOUTH EVERY DAY 90 tablet 3   Blood Glucose Monitoring Suppl (ONETOUCH VERIO IQ SYSTEM) w/Device KIT 1 each by Does not apply route 2 (two) times daily. 1 kit 0   carvedilol (COREG) 25 MG tablet Take 1 tablet (25 mg total) by mouth 2 (two) times daily. 60 tablet 2   Continuous Blood Gluc Sensor (FREESTYLE LIBRE 2 SENSOR) MISC Use as directed to monitor blood glucose continuously. Change sensor Q 14 days. DX E11.65 2 each 11   diltiazem (CARDIZEM CD) 120 MG 24 hr capsule Take 2 capsules (240 mg total) by mouth daily. 60 capsule 5   diltiazem (CARDIZEM LA) 240 MG 24 hr tablet Take 1 tablet (240 mg total) by mouth daily. 90 tablet 3   doxazosin (CARDURA) 2 MG tablet Take 1 tablet (2 mg total) by mouth daily. 30 tablet 3   ferrous sulfate 325 (65 FE) MG EC tablet Take 325 mg by mouth 3 (three) times daily with meals.     furosemide (LASIX) 40 MG tablet TAKE 1 TABLET (40 MG TOTAL) BY MOUTH AS NEEDED FOR FLUID OR EDEMA. 90 tablet 3   glucose blood test strip 1 each by Other route as needed for other. Use as instructed  100 each 11   hydrALAZINE (APRESOLINE) 50 MG tablet TAKE 1 TABLET BY MOUTH THREE TIMES A DAY 270 tablet 1   JARDIANCE 25 MG TABS tablet TAKE 1 TABLET (25 MG TOTAL) BY MOUTH DAILY. 14 tablet 25   losartan (COZAAR) 100 MG tablet TAKE 1 TABLET BY MOUTH EVERY DAY 90 tablet 3   pioglitazone (ACTOS) 30 MG tablet Take 30 mg by mouth daily.     warfarin (COUMADIN) 10 MG tablet Take 0.5-1 tablets (5-10 mg total) by mouth daily. Pt takes 5 mg Mon-Wed-Fri-Sat and 10 mg Tues-Thurs-Sun (Patient not taking: Reported on 02/06/2022)     No current facility-administered medications for this encounter.    Physical Findings: The patient is in no acute distress. Patient is alert and oriented. Wt Readings from Last  3 Encounters:  02/06/22 166 lb 6.4 oz (75.5 kg)  12/26/21 175 lb 6.4 oz (79.6 kg)  11/21/21 190 lb 6.4 oz (86.4 kg)    height is _0  (1.753 m) and weight is 166 lb 6.4 oz (75.5 kg). His oral temperature is 97.5 F (36.4 C) (abnormal). His blood pressure is 162/129 (abnormal) and his pulse is 90. His respiration is 18 and oxygen saturation is 100%. .  General: Alert and oriented, in no acute distress HEENT: Head is normocephalic. Extraocular movements are intact. Oropharynx is notable for no thrush,  no tumor.    Neck: improved anterior lymphedema, no obvious adenopathy to deep palpation. Skin: neck skin intact and satisfactorily healed Psychiatric: Pleasant to speak with. Affect is appropriate. Chest CTAB   Heart RRR  Abd: Soft NT ND  Lab Findings: Lab Results  Component Value Date   WBC 4.6 11/09/2021   HGB 10.8 (L) 11/09/2021   HCT 33.1 (L) 11/09/2021   MCV 82.5 11/09/2021   PLT 204 11/09/2021    Lab Results  Component Value Date   TSH 2.94 09/21/2021    Radiographic Findings: CT Chest Wo Contrast  Result Date: 02/05/2022 CLINICAL DATA:  Left lower lobe non-small cell lung cancer status post chemotherapy and radiation therapy. Additional history of tongue cancer. Restaging. * Tracking Code: BO * EXAM: CT CHEST WITHOUT CONTRAST TECHNIQUE: Multidetector CT imaging of the chest was performed following the standard protocol without IV contrast. RADIATION DOSE REDUCTION: This exam was performed according to the departmental dose-optimization program which includes automated exposure control, adjustment of the mA and/or kV according to patient size and/or use of iterative reconstruction technique. COMPARISON:  10/02/2021 chest CT.  06/08/2021 PET-CT. FINDINGS: Cardiovascular: Normal heart size. Stable trace pericardial effusion. Three-vessel coronary atherosclerosis. Atherosclerotic thoracic aorta with dilated 4.2 cm ascending thoracic aorta. Normal caliber pulmonary arteries.  Mediastinum/Nodes: No significant thyroid nodules. Unremarkable esophagus. No axillary adenopathy. No pathologically enlarged mediastinal nodes. Mildly enlarged 1.2 cm left infrahilar node (series 3/image 88), stable. No discrete right hilar adenopathy on these noncontrast images. Lungs/Pleura: No pneumothorax. Trace dependent left pleural effusion, decreased. No right pleural effusion. Patchy consolidation, ground-glass opacity and reticulation throughout the peripheral left lower lobe with associated mild volume loss, distortion and bronchiectasis, increased, compatible with evolving postradiation change. No residual or recurrent measurable pulmonary nodule at the treatment site in the peripheral left lower lobe. A few chronic small scattered solid bilateral pulmonary nodules, largest 0.4 cm in the right middle lobe (series 6/image 102), all stable. No new significant pulmonary nodules. Upper abdomen: Small hiatal hernia. Musculoskeletal: No aggressive appearing focal osseous lesions. Chronic healed sternal fracture. Moderate thoracic spondylosis. IMPRESSION: 1. Expected evolution of postradiation change  with no residual or recurrent measurable pulmonary nodule at the treatment site in the peripheral left lower lobe. 2. Mild left infrahilar adenopathy, nonspecific, stable from 10/02/2021 CT. Continued close chest CT surveillance warranted. Otherwise no new or progressive metastatic disease in the chest. 3. Trace dependent left pleural effusion, decreased. 4. Three-vessel coronary atherosclerosis. 5. Small hiatal hernia. 6.  Aortic Atherosclerosis (ICD10-I70.0). Electronically Signed   By: Ilona Sorrel M.D.   On: 02/05/2022 08:37   CT Soft Tissue Neck Wo Contrast  Result Date: 02/04/2022 CLINICAL DATA:  Base-of-tongue cancer. Left completed chemoradiation. EXAM: CT NECK WITHOUT CONTRAST TECHNIQUE: Multidetector CT imaging of the neck was performed following the standard protocol without intravenous contrast.  RADIATION DOSE REDUCTION: This exam was performed according to the departmental dose-optimization program which includes automated exposure control, adjustment of the mA and/or kV according to patient size and/or use of iterative reconstruction technique. COMPARISON:  CT neck 10/02/2021. FINDINGS: Evaluation is limited in the absence of intravenous contrast. Pharynx and larynx: The nasal cavity and nasopharynx are unremarkable. There is no definite recurrent mass lesion in the base of tongue. The parapharyngeal spaces are clear. There is edematous thickening of the epiglottis and oropharyngeal mucosa, similar to the prior study. The vocal folds are grossly normal in appearance. There is no retropharyngeal fluid collection. The airway is patent. Salivary glands: The parotid and submandibular glands are unremarkable. Thyroid: Unremarkable. Lymph nodes: There is no new or progressive lymphadenopathy in the neck. Vascular: There is calcified plaque in the bilateral carotid bifurcations. Limited intracranial: The imaged portions of the intracranial compartment are grossly unremarkable. Visualized orbits: Not evaluated. Mastoids and visualized paranasal sinuses: There is mild mucosal thickening in the left maxillary sinus. There is a right mastoid and middle ear effusion, unchanged. There is no evidence of ossicular erosion. Skeleton: There is advanced degenerative change throughout the cervical spine. There is no acute osseous abnormality or suspicious osseous lesion. Upper chest: Assessed on the separately dictated CT chest. Other: Mild stranding throughout the subcutaneous fat in the neck is not significantly changed. There is extensive dental disease. IMPRESSION: 1. Stable posttreatment appearance of the neck with no definite evidence of residual or recurrent disease, within the confines of noncontrast technique. 2. Overall unchanged mucosal edema and epiglottic thickening, favored posttreatment related.  Electronically Signed   By: Valetta Mole M.D.   On: 02/04/2022 14:19     Impression/Plan:    1) Head and Neck Cancer Status: I reviewed his imaging with him and he is in remission.  He has had an excellent response to SBRT to his lung.  No evidence of recurrence in his neck.     2) Nutritional Status:  tolerating oral intake, PEG removed, sees SLP.  Advised him to increase protein/calories given weight loss. He likes peanut butter and will consider more of this. Wt Readings from Last 3 Encounters:  02/06/22 166 lb 6.4 oz (75.5 kg)  12/26/21 175 lb 6.4 oz (79.6 kg)  11/21/21 190 lb 6.4 oz (86.4 kg)    3) Swallowing:   seeing SLP  4) Dental: Encouraged to continue regular followup with dentistry, and dental hygiene including fluoride products.      5) Thyroid function: Check annually in med onc Lab Results  Component Value Date   TSH 2.94 09/21/2021    6) Other: Continue PT for neck edema as tolerated.     7) Follow-up with me in 6 months with repeat imaging of neck and chest  On date of service,  in total, I spent 30 minutes on this encounter. Patient was seen in person. _____________________________________   [name], MD

## 2022-02-07 ENCOUNTER — Ambulatory Visit: Payer: Medicare HMO

## 2022-02-07 DIAGNOSIS — R1313 Dysphagia, pharyngeal phase: Secondary | ICD-10-CM | POA: Diagnosis not present

## 2022-02-07 NOTE — Therapy (Signed)
OUTPATIENT SPEECH LANGUAGE PATHOLOGY TREATMENT   Patient Name: DEMTRIUS [name] MRN: [id number] DOB:02/16/1956, 66 y.o., male Today's Date: 02/07/2022  PCP: Jenna Luo, MD REFERRING PROVIDER: [name], MD    End of Session - 02/07/22 1527     Visit Number 7    Number of Visits 21    Date for SLP Re-Evaluation 02/15/22    SLP Start Time 1950    SLP Stop Time  63    SLP Time Calculation (min) 33 min    Activity Tolerance Patient tolerated treatment well                  Past Medical History:  Diagnosis Date   Arthritis    Atrial fibrillation ([place])    Cancer ([place])    SCC base of tongue 2021   Cardiomyopathy    Cerebrovascular accident ([place])    2012   Diabetes mellitus    Hyperlipidemia    Hypertension    Left-sided weakness    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 ([place])    Dr. Zigmund Daniel, laser therapy OD   Seizures ([place])    pt reports this is from low blood sugar   Past Surgical History:  Procedure Laterality Date   COLONOSCOPY     DIRECT LARYNGOSCOPY N/A 02/10/2020   Procedure: DIRECT LARYNGOSCOPY WITH BIOPSY;  Surgeon: [name], MD;  Location: [facility];  Service: ENT;  Laterality: N/A;   Ear surgery as a child     IR GASTROSTOMY TUBE MOD SED  03/23/2020   IR GASTROSTOMY TUBE REMOVAL  06/01/2020   IR IMAGING GUIDED PORT INSERTION  03/09/2020   IR REMOVAL TUN ACCESS W/ PORT W/O FL MOD SED  02/08/2021   TONSILLECTOMY     Patient Active Problem List   Diagnosis Date Noted   Chronic congestive heart failure with left ventricular diastolic dysfunction ([place]) 11/19/2021   History of CVA with residual deficit 11/03/2021   Dysphagia 11/03/2021   Fall at home, initial encounter 93/26/7124   Diastolic dysfunction 58/10/9831   Pancytopenia ([place]) 11/03/2021   Primary cancer of left lower lobe of lung ([place]) 06/13/2021   Normocytic normochromic anemia 03/20/2021   Stage 3b chronic kidney disease ([place]) 03/13/2021   Syncope and collapse 02/02/2021    Dilated cardiomyopathy ([place]) 02/02/2021   Weight loss, unintentional 05/26/2020   Insomnia 05/09/2020   CKD (chronic kidney disease) 05/09/2020   Chemotherapy induced diarrhea 05/09/2020   Malignant neoplasm of base of tongue ([place]) 03/02/2020   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 ([place])    Status epilepticus ([place]) 01/18/2013   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 status epilepticus, not intractable ([place]) 01/15/2013   Hyperlipidemia    Long term current use of anticoagulant 03/11/2010   Diabetes mellitus, type II ([place]) 12/20/2009   Essential hypertension, benign 12/20/2009   Atrial fibrillation ([place]) 82/50/5397   Systolic heart failure ([place]) 12/20/2009   CEREBROVASCULAR ACCIDENT 12/20/2009    ONSET DATE:  Winter 2022  REFERRING DIAG: C01 (ICD-10-CM) - Malignant neoplasm of base of tongue ([place])   THERAPY DIAG:  Dysphagia, pharyngeal phase  Rationale for Evaluation and Treatment Rehabilitation  SUBJECTIVE:   SUBJECTIVE STATEMENT: "Oh yeah" (asked if he has completed HEP >3 days/week)  Pt accompanied by: self  PERTINENT HISTORY: Pt seen for short course of ST in 2022 for prophylactic swallow exercises/therapy but pt did not schedule further ST after his 3rd visit. 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 Previous CVA 2011 which pt reportedly did not attend recommended OP therapies. CHF,  CVA, Afib, CKD, DM type 2.  PAIN:  Are you having pain? No  PATIENT GOALS: Swallow improvement  OBJECTIVE:   DIAGNOSTIC FINDINGS:  CT head 11-03-21 IMPRESSION: No acute intracranial abnormality. Chronic parietal lobe infarcts, right greater than left. Chronic left caudate infarct. Advanced chronic small vessel ischemic disease.  RECOMMENDATIONS FROM OBJECTIVE SWALLOW STUDY (MBSS/FEES):  Modified Barium Swallow Progress Note  Today's Date: 10/26/2021 Modified Barium Swallow   Brief recommendations include the following: Clinical Impression             Pt has a mild-moderate pharyngeal dysphagia suspected to be  related to post-radiation changes with what appears to be edematous tissue, particularly noted at the epiglottis and arytenoids. He has reduced base of tongue retraction, hyolaryngeal excursion, epiglottic inversion, and laryngeal vestibule closure. Because he does not achieve full closure, all liquids and even purees are penetrated in small amounts, but can be cleared well with a cued throat clear. Thin liquid penetrates reach the level of the true vocal folds and do fall below, with aspiration that is sensed as evidenced by a spontaneous throat clear (PAS 7). This throat clear does not adequately move aspirates, but when he is cued to clear his throat harder, he ejects them from his airway completely. Other compensatory strategies, such as a supraglottic swallow or chin tuck, do not increase airway protection. Pt has increased residue in his valleculae with thicker liquids and foods, with residue most significant when given a bite of a cereal bar. This stickier texture left severe residue in his valleculae (almost the entire bolus), but he still maintained good control of it by bringing it back up into his mouth and trying to re-swallow it. He ultimately used a liquid wash to clear most of this residue. This happened to be the only time during the study that pt had any subjective c/o residue, and it was the most significant. Education was provided to pt with daughter also present. Given that penetration is likely with thicker liquids (and aspiration possible across a meal), there is increased residue, and pt has remained stable from a pulmonary standpoint, recommend continuing with thin liquids in the setting of thorough oral care and implementation of a cough or more effortful throat clear post-swallow. Could consider making foods a little softer (mechanical soft) to facilitate pharyngeal clearance and subjective symptoms. Given that pt is reporting an acute change in swallowing, recommend f/u with OP  SLP. Swallow Evaluation Recommendations  SLP Diet Recommendations: Dysphagia 3 (Mech soft) solids;Thin liquid  Liquid Administration via: Cup;Straw  Medication Administration: Whole meds with puree  Supervision: Patient able to self feed  Compensations: Slow rate;Small sips/bites;Clear throat after each swallow;Hard cough after swallow  Postural Changes: Seated upright at 90 degrees  Oral Care Recommendations: Oral care BID   PATIENT REPORTED OUTCOME MEASURES (PROM): EAT-10: provided on 12/13/21.   TODAY'S TREATMENT:  02/07/22: SLP saw pt's follow up note with Dr. Isidore Moos yesterday - he stated he had difficulty with larger sips and slarger bites. Pt endorsed this, and said he is extra careful about smaller sips and bites. Today pt ate two peanut butter cups after assuring SLP he would be fine in light of his diabetes, and drank water. He followed precautions with modifications, he ate slowly, took small bites and sips, coughed every other bite/sip ("it's too much to cough every bite," pt stated even after SLP encouraged him to do so.  HEP was completed with modified independence.  At this time it is appropriate to  see pt in 6 weeks.  01/24/22: Pt returned EAT-10 with score of 15/40 (lower scores indicative of better QOL). With dys III item and water today, pt followed precautions except that SLP had to cue pt for cough after each swallow. Pt complained of having to cough for every bite/sip and SLP reiterated the rationale for it. Pt voiced understanding ("I guess I don't want to end up in the hospital again.") Pt req'd SBA with HEP. SLP told pt if all cont to look good in next 1-2 visits, likely that d/c will occur.   01/10/22: "I forgot that damn paper again. I'm sorry. (PROM)." Pt to bring PROM next session. Pt remains consistent with frequency of HEP. Procedure today was WNL. SLP asked about precautions: "I forget sometimes but not too often." Today pt req'd initial cues for smaller sips and  throat clear/cough but then was indpendent after this. Pt c/o thickened saliva and told SLP he drinks very little water during the day. SLP suggested 60 oz/day. Pt will attempt this. He stated he does not want to a follow up MBS but will instead cont to follow precautions from this time forward. He reduced frequency today to every other week.   12-27-21: Pt states he has done HEP twice, maybe three or four times a day - "depends on what I'm doing that day." SLP reiterated goal to pt to have 20-30 reps done of each exercise/day. He acknowledged understanding. Procedure for HEP is WNL. He followed dysphagia precautions with POs with initial reminder but did not require any more cues after this. Pt stated he was performing cough or throat clear and re-swallow with POs.  Pt has lymphedema eval 01-04-22. SLP stated if pt success continued he could reduce frequency starting next week.   12/21/21: Pt cont'd to present with what appears to be lymphedema, and asks SLP if he notices pt's left side of face "is bigger than this (right) side?". SLP inquired with referring MD about PT referral.  SLP req'd to provide cues for pt to cough/throat clear and reswallow with POs. He politely refused cereal bar and SLP provided pt peanut butter crackers which he said he ate every 3-4 weeks. Pt took one bite and said it was difficult to achieve pharyngeal clearance and refused subsequent bites. SLP told pt that this PO type was not on his recommended diet and confirmed with him that items you can mash with your fork would be recommended. Pt acknowledged these would be much easier for him to swallow.  He req'd min A rarely with HEP today. Told SLP he has been completing HEP "almost every day" except for CTAR due to not rolling up a towel yet. SLP strongly urged him to do this and provided guidance again on what 4" looked like.   12/13/21: Provided pt EAT-10 and he will return next session. Pt req's mod A faded to independence with  effortful and MAsako, and SLP taught pt CTAR and Mendelsohn, which he req'd initial min-mod A faded to independence. Pt has completed effortful swallow but not Masako since last session but now knows he needs to complete all 4 exercises BID, and was explained rationale for this, as well as rationale for cough/throat clear and reswallow with EVERY bite or sip. "I'm taking smaller bites and sips for sure, buddy," pt told SLP.   12/06/21: SLP discussed the importance of performing HEP scope and frequency as prescribed, BID. SLP educated pt on the procedure for HEP exercises.   PATIENT  EDUCATION: Education details: See "today's treatment"    Person educated: Patient and Child(ren) Education method: Explanation, Demonstration, Verbal cues, and Handouts Education comprehension: verbalized understanding, returned demonstration, verbal cues required, and needs further education   ASSESSMENT:  CLINICAL IMPRESSION: Patient is a 66 y.o. male who is seen for swallow treatment to perform HEP, and adhere to precautions after MBSS 10-26-21. SEE TX NOTE from today. Pt has stated he does not want follow up MBS. Today, pt cont to perform HEP with modified independence/independence and follows precautions appropriately - with a modification to coughing not every bite/sip. Pt's d/c may occur in next 1-2 visits.  OBJECTIVE IMPAIRMENTS: include dysphagia. These impairments are limiting patient from safety when swallowing. Factors affecting potential to achieve goals and functional outcome are cooperation/participation level and severity of impairments. Patient will benefit from skilled SLP services to address above impairments and improve overall function.  REHAB POTENTIAL: Fair due to severity and cooperation/participation.   GOALS: Goals reviewed with patient? Yes  SHORT TERM GOALS: Target date: 01/11/2022   Pt will follow precautions with POs in 3 sessions with rare min A Baseline:12-27-21,01-10-22 Goal  status: partially met  2.  Pt will complete HEP for swallowing with rare min A in 3 sessions Baseline:  Goal status: met - indpendent  3.  Pt will tell SLP 3 overt s/sx aspiration PNA with modified independence Baseline:  Goal status: met   LONG TERM GOALS: Target date: 02/15/2022    Pt's EAT-10 score will improve by last day of therapy Baseline:  Goal status: ongoing  2.  Pt will follow precautions with POs in 4 sessions with modified independence (mod I) Baseline: 01-24-22, 02-07-22 Goal status: ongoing  3.  Pt will complete HEP for swallowing with mod I in 4 sessions Baseline: 01-10-22, 01-24-22, 02-07-22 Goal status: met, modified  4.  Pt will undergo follow up modified barium swallow exam if/when he completes 8 weeks of HEP, at least 6 days/week Baseline:  Goal status: deferred - pt stated he does not want another MBS   PLAN: SLP FREQUENCY: 1x/week  SLP DURATION: 10 weeks  PLANNED INTERVENTIONS: Aspiration precaution training, Pharyngeal strengthening exercises, Diet toleration management , Environmental controls, Trials of upgraded texture/liquids, Internal/external aids, Oral motor exercises, SLP instruction and feedback, Compensatory strategies, Patient/family education, and Re-evaluation    [place], [name] 02/07/2022, 3:28 PM

## 2022-02-14 ENCOUNTER — Ambulatory Visit: Payer: Self-pay | Admitting: *Deleted

## 2022-02-14 NOTE — Patient Outreach (Signed)
  Care Coordination   02/14/2022  Name: Brian [name] MRN: [id number] DOB: May 02, 1955   Care Coordination Outreach Attempts:  An unsuccessful telephone outreach was attempted today to offer the patient information about available care coordination services as a benefit of their health plan. HIPAA compliant messages left on voicemail for patient and patient's son, [name], providing contact information for CSW, encouraging them to return CSW's call at their earliest convenience.  Follow Up Plan:  Additional outreach attempts will be made to offer the patient care coordination information and services.   Encounter Outcome:  No Answer.   Care Coordination Interventions:  No, not indicated.    Nat Christen, BSW, MSW, [name]  Licensed Education officer, [facility]  Mailing [place] [address] [place], [place] [address] Physical Address-[address] [place], [place] [address] Toll Free Main # [phone] Fax # [phone] Cell # [phone] [name]

## 2022-02-21 ENCOUNTER — Ambulatory Visit: Payer: Medicare HMO | Attending: Radiation Oncology

## 2022-02-21 ENCOUNTER — Telehealth: Payer: Self-pay | Admitting: *Deleted

## 2022-02-21 DIAGNOSIS — R1313 Dysphagia, pharyngeal phase: Secondary | ICD-10-CM | POA: Diagnosis not present

## 2022-02-21 NOTE — Progress Notes (Unsigned)
  Care Coordination Note  02/21/2022 Name: [name] MRN: [id number] DOB: 01/23/1956  Brian [name] is a 67 y.o. year old male who is a primary care patient of [name], MD and is actively engaged with the care management team. I reached out to Brian [name] by phone today to assist with re-scheduling a follow up visit with the Licensed Clinical Social Worker  Follow up plan: Unsuccessful telephone outreach attempt made. A HIPAA compliant phone message was left for the patient providing contact information and requesting a return call.   [place]  Direct Dial: [phone]

## 2022-02-21 NOTE — Therapy (Signed)
OUTPATIENT SPEECH LANGUAGE PATHOLOGY TREATMENT/Recert/discharge   Patient Name: Brian [name] MRN: [id number] DOB:1955-11-14, 67 y.o., male Today's Date: 02/21/2022  PCP: Jenna Luo, MD REFERRING PROVIDER: [name], MD    End of Session - 02/21/22 2317     Visit Number 8    Number of Visits 21    Date for SLP Re-Evaluation 02/21/22    SLP Start Time 13    SLP Stop Time  9201    SLP Time Calculation (min) 23 min    Activity Tolerance Patient tolerated treatment well                   Past Medical History:  Diagnosis Date   Arthritis    Atrial fibrillation ([place])    Cancer ([place])    SCC base of tongue 2021   Cardiomyopathy    Cerebrovascular accident ([place])    2012   Diabetes mellitus    Hyperlipidemia    Hypertension    Left-sided weakness    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 ([place])    Dr. Zigmund Daniel, laser therapy OD   Seizures ([place])    pt reports this is from low blood sugar   Past Surgical History:  Procedure Laterality Date   COLONOSCOPY     DIRECT LARYNGOSCOPY N/A 02/10/2020   Procedure: DIRECT LARYNGOSCOPY WITH BIOPSY;  Surgeon: [name], MD;  Location: [facility] OR;  Service: ENT;  Laterality: N/A;   Ear surgery as a child     IR GASTROSTOMY TUBE MOD SED  03/23/2020   IR GASTROSTOMY TUBE REMOVAL  06/01/2020   IR IMAGING GUIDED PORT INSERTION  03/09/2020   IR REMOVAL TUN ACCESS W/ PORT W/O FL MOD SED  02/08/2021   TONSILLECTOMY     Patient Active Problem List   Diagnosis Date Noted   Chronic congestive heart failure with left ventricular diastolic dysfunction ([place]) 11/19/2021   History of CVA with residual deficit 11/03/2021   Dysphagia 11/03/2021   Fall at home, initial encounter 00/71/2197   Diastolic dysfunction 58/83/2549   Pancytopenia ([place]) 11/03/2021   Primary cancer of left lower lobe of lung ([place]) 06/13/2021   Normocytic normochromic anemia 03/20/2021   Stage 3b chronic kidney disease ([place]) 03/13/2021   Syncope and collapse  02/02/2021   Dilated cardiomyopathy ([place]) 02/02/2021   Weight loss, unintentional 05/26/2020   Insomnia 05/09/2020   CKD (chronic kidney disease) 05/09/2020   Chemotherapy induced diarrhea 05/09/2020   Malignant neoplasm of base of tongue ([place]) 03/02/2020   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 [facility])    Status epilepticus ([place]) 01/18/2013   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 Pt has made gains in ability with HEP, some gains with following precautions after MBS in September, and education about aspiration PNA. He will be seen today with LTGs enforced and will then be d/c'd   Remaining deficits: Mild dysphagia. Pt does not eat "tough meat. I just stay away from it. I don't miss it at all."   Education / Equipment: HEP procedure, aspiration PNA s/sx, how to modify HEP frequency for maintenance, rationale for  HEP, late effects head/neck radiation on swallow function  Patient agrees to discharge. Patient goals were partially met. Patient is being discharged due to being pleased with the current functional level..    ONSET DATE:  Winter 2022  REFERRING DIAG: C01 (ICD-10-CM) - Malignant neoplasm of base of tongue ([place])   THERAPY DIAG:  Dysphagia, pharyngeal phase  Rationale for Evaluation and Treatment Rehabilitation  SUBJECTIVE:   SUBJECTIVE STATEMENT: Pt states he has been performing exercises but not to frequency as prescribed.   Pt accompanied by: self  PERTINENT HISTORY: Pt seen for short course of ST in 2022 for  prophylactic swallow exercises/therapy but pt did not schedule further ST after his 3rd visit. 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 Previous CVA 2011 which pt reportedly did not attend recommended OP therapies. CHF, CVA, Afib, CKD, DM type 2.  PAIN:  Are you having pain? No  PATIENT GOALS: Swallow improvement  OBJECTIVE:   DIAGNOSTIC FINDINGS:  CT head 11-03-21 IMPRESSION: No acute intracranial abnormality. Chronic parietal lobe infarcts, right greater than left. Chronic left caudate infarct. Advanced chronic small vessel ischemic disease.  RECOMMENDATIONS FROM OBJECTIVE SWALLOW STUDY (MBSS/FEES):  Modified Barium Swallow Progress Note  Today's Date: 10/26/2021 Modified Barium Swallow   Brief recommendations include the following: Clinical Impression             Pt has a mild-moderate pharyngeal dysphagia suspected to be related to post-radiation changes with what appears to be edematous tissue, particularly noted at the epiglottis and arytenoids. He has reduced base of tongue retraction, hyolaryngeal excursion, epiglottic inversion, and laryngeal vestibule closure. Because he does not achieve full closure, all liquids and even purees are penetrated in small amounts, but can be cleared well with a cued throat clear. Thin liquid penetrates reach the level of the true vocal folds and do fall below, with aspiration that is sensed as evidenced by a spontaneous throat clear (PAS 7). This throat clear does not adequately move aspirates, but when he is cued to clear his throat harder, he ejects them from his airway completely. Other compensatory strategies, such as a supraglottic swallow or chin tuck, do not increase airway protection. Pt has increased residue in his valleculae with thicker liquids and foods, with residue most significant when given a bite of a cereal bar. This stickier texture left severe residue in his valleculae (almost the entire bolus), but he still maintained good control of it by  bringing it back up into his mouth and trying to re-swallow it. He ultimately used a liquid wash to clear most of this residue. This happened to be the only time during the study that pt had any subjective c/o residue, and it was the most significant. Education was provided to pt with daughter also present. Given that penetration is likely with thicker liquids (and aspiration possible across a meal), there is increased residue, and pt has remained stable from a pulmonary standpoint, recommend continuing with thin liquids in the setting of thorough oral care and implementation of a cough or more effortful throat clear post-swallow. Could consider making foods a little softer (mechanical soft) to facilitate pharyngeal clearance and subjective symptoms. Given that pt is reporting an acute change in swallowing, recommend f/u with OP SLP. Swallow Evaluation Recommendations  SLP Diet Recommendations: Dysphagia 3 (Mech soft) solids;Thin liquid  Liquid Administration via: Cup;Straw  Medication Administration: Whole meds with puree  Supervision: Patient able to self feed  Compensations: Slow rate;Small sips/bites;Clear throat after each swallow;Hard cough after swallow  Postural Changes: Seated upright at 90 degrees  Oral Care Recommendations: Oral care BID   PATIENT REPORTED OUTCOME MEASURES (PROM): EAT-10: provided on 12/13/21.  See note from 01/24/22. However this score is so far removed from initial function it is considered invalid.    TODAY'S TREATMENT:  02/21/22: Given "s" statement, SLP reiterated at least 5-6 days/week with 20-30 reps of Masako, Mendelson, and effortful swallow, with 6 reps of towel exercise. Today he accomplished HEP with modified independence. SLP reinforced pt will need to perform these at this frequency until next month and then perform 2-3 times a week. Pt has eaten spaghetti, grits, salisbury steak, baked sweet potato, and meatballs. Is refraining from eating dry meats like pork,  dry chicken and beef. Today SLP provided pt with Reese's cups and he drank his diet Cheerwine; Pt assured SLP that two Reeses cups would be ok with pt's diabetes. Pt followed precautions small bites/sips, slow rate, clear throat/cough (but again as last session pt did not perform every bite or sip). Pt stated awareness of the need to cough/throat clear after every bite and reiterated he thought it was too frequent and stated "If I need to, I cough every one."  SLP educated pt on s/sx of dysphagia from this point, and that he will need to contact Dr. Isidore Moos, Dr. Benjamine Mola, or Dr. Dennard Schaumann if these occur.  02/07/22: SLP saw pt's follow up note with Dr. Isidore Moos yesterday - he stated he had difficulty with larger sips and slarger bites. Pt endorsed this, and said he is extra careful about smaller sips and bites. Today pt ate two peanut butter cups after assuring SLP he would be fine in light of his diabetes, and drank water. He followed precautions with modifications, he ate slowly, took small bites and sips, coughed every other bite/sip ("it's too much to cough every bite," pt stated even after SLP encouraged him to do so.  HEP was completed with modified independence.  At this time it is appropriate to see pt in 6 weeks.  01/24/22: Pt returned EAT-10 with score of 15/40 (lower scores indicative of better QOL). With dys III item and water today, pt followed precautions except that SLP had to cue pt for cough after each swallow. Pt complained of having to cough for every bite/sip and SLP reiterated the rationale for it. Pt voiced understanding ("I guess I don't want to end up in the hospital again.") Pt req'd SBA with HEP. SLP told pt if all cont to look good in next 1-2 visits, likely that d/c will occur.   01/10/22: "I forgot that damn paper again. I'm sorry. (PROM)." Pt to bring PROM next session. Pt remains consistent with frequency of HEP. Procedure today was WNL. SLP asked about precautions: "I forget sometimes  but not too often." Today pt req'd initial cues for smaller sips and throat clear/cough but then was indpendent after this. Pt c/o thickened saliva and told SLP he drinks very little water during the day. SLP suggested 60 oz/day. Pt will attempt this. He stated he does not want to a follow up MBS but will instead cont to follow precautions from this time forward. He reduced frequency today to every other week.   12-27-21: Pt states he has done HEP twice, maybe three or four times a day - "depends on what I'm doing that day." SLP reiterated goal to pt to have 20-30 reps done of each exercise/day. He acknowledged understanding. Procedure for HEP is WNL. He followed dysphagia precautions  with POs with initial reminder but did not require any more cues after this. Pt stated he was performing cough or throat clear and re-swallow with POs.  Pt has lymphedema eval 01-04-22. SLP stated if pt success continued he could reduce frequency starting next week.   12/21/21: Pt cont'd to present with what appears to be lymphedema, and asks SLP if he notices pt's left side of face "is bigger than this (right) side?". SLP inquired with referring MD about PT referral.  SLP req'd to provide cues for pt to cough/throat clear and reswallow with POs. He politely refused cereal bar and SLP provided pt peanut butter crackers which he said he ate every 3-4 weeks. Pt took one bite and said it was difficult to achieve pharyngeal clearance and refused subsequent bites. SLP told pt that this PO type was not on his recommended diet and confirmed with him that items you can mash with your fork would be recommended. Pt acknowledged these would be much easier for him to swallow.  He req'd min A rarely with HEP today. Told SLP he has been completing HEP "almost every day" except for CTAR due to not rolling up a towel yet. SLP strongly urged him to do this and provided guidance again on what 4" looked like.   12/13/21: Provided pt EAT-10 and he  will return next session. Pt req's mod A faded to independence with effortful and MAsako, and SLP taught pt CTAR and Mendelsohn, which he req'd initial min-mod A faded to independence. Pt has completed effortful swallow but not Masako since last session but now knows he needs to complete all 4 exercises BID, and was explained rationale for this, as well as rationale for cough/throat clear and reswallow with EVERY bite or sip. "I'm taking smaller bites and sips for sure, buddy," pt told SLP.   12/06/21: SLP discussed the importance of performing HEP scope and frequency as prescribed, BID. SLP educated pt on the procedure for HEP exercises.   PATIENT EDUCATION: Education details: See "today's treatment"    Person educated: Patient and Child(ren) Education method: Explanation, Demonstration, Verbal cues, and Handouts Education comprehension: verbalized understanding, returned demonstration, verbal cues required, and needs further education   ASSESSMENT:  CLINICAL IMPRESSION: Recertification and then d/c today. Pt is eager for d/c. Patient is a 67 y.o. male who is seen for swallow treatment to perform HEP, and adhere to precautions after MBSS 10-26-21. SEE TX NOTE from today. Pt has stated he does not want follow up MBS. Today, pt cont to perform HEP with modified independence/independence and stated he follows precautions "just not all the time" - and with a modification to coughing every other/third bite or sip.   OBJECTIVE IMPAIRMENTS: include dysphagia. These impairments are limiting patient from safety when swallowing. Factors affecting potential to achieve goals and functional outcome are cooperation/participation level and severity of impairments. Patient will benefit from skilled SLP services to address above impairments and improve overall function.  REHAB POTENTIAL: Fair due to severity and cooperation/participation.   GOALS: Goals reviewed with patient? Yes  SHORT TERM GOALS: Target  date: 01/11/2022   Pt will follow precautions with POs in 3 sessions with rare min A Baseline:12-27-21,01-10-22 Goal status: partially met  2.  Pt will complete HEP for swallowing with rare min A in 3 sessions Baseline:  Goal status: met - indpendent  3.  Pt will tell SLP 3 overt s/sx aspiration PNA with modified independence Baseline:  Goal status: met   LONG TERM  GOALS: Target date: 02/15/2022 , 02-21-22   Pt's EAT-10 score will improve by last day of therapy Baseline:  Goal status: deferred  2.  Pt will follow precautions with POs in 4 sessions with modified independence (mod I) Baseline: 01-24-22, 02-07-22 Goal status: partially met  3.  Pt will complete HEP for swallowing with mod I in 4 sessions Baseline: 01-10-22, 01-24-22, 02-07-22 Goal status: met, modified, met  4.  Pt will undergo follow up modified barium swallow exam if/when he completes 8 weeks of HEP, at least 6 days/week Baseline:  Goal status: deferred - pt stated he does not want another MBS   PLAN:  pt will be recerted today and d/c'd.  PLANNED INTERVENTIONS: Aspiration precaution training, Pharyngeal strengthening exercises, Diet toleration management , Environmental controls, Trials of upgraded texture/liquids, Internal/external aids, Oral motor exercises, SLP instruction and feedback, Compensatory strategies, Patient/family education, and Re-evaluation    [place], [name] 02/21/2022, 11:18 PM

## 2022-02-22 ENCOUNTER — Ambulatory Visit: Payer: Medicare HMO | Attending: Radiation Oncology

## 2022-02-22 DIAGNOSIS — C01 Malignant neoplasm of base of tongue: Secondary | ICD-10-CM | POA: Diagnosis not present

## 2022-02-22 DIAGNOSIS — I890 Lymphedema, not elsewhere classified: Secondary | ICD-10-CM | POA: Insufficient documentation

## 2022-02-22 NOTE — Therapy (Addendum)
OUTPATIENT PHYSICAL THERAPY HEAD AND NECK TREATMENT   Patient Name: Brian [name] MRN: [id number] DOB:[birth year], 67 y.o., male Today's Date: 02/22/2022   PT End of Session - 02/22/22 1007     Visit Number 7    Number of Visits 8    Date for PT Re-Evaluation 03/01/22    PT Start Time 1005    PT Stop Time 1100    PT Time Calculation (min) 55 min    Activity Tolerance Patient tolerated treatment well    Behavior During Therapy [facility] for tasks assessed/performed             Past Medical History:  Diagnosis Date   Arthritis    Atrial fibrillation ([place])    Cancer ([place])    SCC base of tongue 2021   Cardiomyopathy    Cerebrovascular accident ([place])    2012   Diabetes mellitus    Hyperlipidemia    Hypertension    Left-sided weakness    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 ([place])    Dr. Zigmund Daniel, laser therapy OD   Seizures ([place])    pt reports this is from low blood sugar   Past Surgical History:  Procedure Laterality Date   COLONOSCOPY     DIRECT LARYNGOSCOPY N/A 02/10/2020   Procedure: DIRECT LARYNGOSCOPY WITH BIOPSY;  Surgeon: [name], MD;  Location: [facility] OR;  Service: ENT;  Laterality: N/A;   Ear surgery as a child     IR GASTROSTOMY TUBE MOD SED  03/23/2020   IR GASTROSTOMY TUBE REMOVAL  06/01/2020   IR IMAGING GUIDED PORT INSERTION  03/09/2020   IR REMOVAL TUN ACCESS W/ PORT W/O FL MOD SED  02/08/2021   TONSILLECTOMY     Patient Active Problem List   Diagnosis Date Noted   Chronic congestive heart failure with left ventricular diastolic dysfunction ([place]) 11/19/2021   History of CVA with residual deficit 11/03/2021   Dysphagia 11/03/2021   Fall at home, initial encounter 63/78/5885   Diastolic dysfunction 02/77/4128   Pancytopenia ([place]) 11/03/2021   Primary cancer of left lower lobe of lung ([place]) 06/13/2021   Normocytic normochromic anemia 03/20/2021   Stage 3b chronic kidney disease ([place]) 03/13/2021   Syncope and collapse 02/02/2021   Dilated cardiomyopathy  ([place]) 02/02/2021   Weight loss, unintentional 05/26/2020   Insomnia 05/09/2020   CKD (chronic kidney disease) 05/09/2020   Chemotherapy induced diarrhea 05/09/2020   Malignant neoplasm of base of tongue ([place]) 03/02/2020   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 [facility])    Status epilepticus ([place]) 01/18/2013   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 status epilepticus, not intractable ([place]) 01/15/2013   Hyperlipidemia    Long term current use of anticoagulant 03/11/2010   Diabetes mellitus, type II ([place]) 12/20/2009   Essential hypertension, benign 12/20/2009   Atrial fibrillation ([place]) 78/67/6720   Systolic heart failure ([place]) 12/20/2009   CEREBROVASCULAR ACCIDENT 12/20/2009    PCP: Jenna Luo, MD  REFERRING PROVIDER: [name], MD  REFERRING DIAG: Lymphedema post radiation  THERAPY DIAG:  Lymphedema, not elsewhere classified  Malignant neoplasm of base of tongue ([place])  Rationale for Evaluation and Treatment: Rehabilitation  ONSET DATE: 05/04/2020  SUBJECTIVE:  SUBJECTIVE STATEMENT:  I was wearing the head and neck compression garment 18-20 hrs a day. It started bothering my beard but I can tell it helps when I wear it. But I had a bout of diarrhea at the beginning of the week and haven't worn it since then. I finished with Glendell Docker (speech therapy) this week and he said I was doing well. I think I'm ready to wrap up with you guys as well.   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, 02/26/20- PET hypermetabolic lesion at the right base of tongue and right tonsil consistent with oropharyngeal carcinoma, bulky bilateral hypermetabolic level 2 metastatic lymph nodes no  evidence of metastatic disease in chest, abdomen or pelvis will receive 35 fractions of radiation to his base of tongue and bilateral neck with weekly cisplatin, started radiation 1/24 and chemo on 1/31; PEG placed 2/2/222,   He completed chemo in March 2022 and  completed radiation to his base of tongue on 05/04/2020    Pt had a feeding tube but that is has been removed He is  see SLP due to having trouble with swallowing/ eating. Also had SBRT to left lung on 07/06/2021. Past history  a fib, major stroke about 10 yrs ago, diabetes   PATIENT GOALS:  PAIN:  Are you having pain? No head and neck lymphedema , CVA (left side affected),Diabetes, Afib, prior left shoulder dislocation with continued difficulty, CHF, CKD   OBJECTIVE:   POSTURE:  Significant Forward head and rounded shoulders posture  SHOULDER AROM:   WFL on right, deficits on left from CVA/dislocated shoulder  CERVICAL ROM; 50% limited with rotation, 60% with bilateral SB    LYMPHEDEMA ASSESSMENT:    Circumference in cm 01/24/2022 02/06/2022 02/22/22  4 cm superior to sternal notch around neck 41.5 41.3 41 40.4  6 cm superior to sternal notch around neck 42.3 42.3 42.4 42.2  8 cm superior to sternal notch around neck 43.5 43.2 43.2 42.9  R lateral nostril from base of nose to medial tragus      L lateral nostril from base of nose to medial tragus      R corner of mouth to where ear lobe meets face      L corner of mouth to where ear lobe meets face                  (Blank rows=not tested)  CURRENT/PAST TREATMENTS:  Surgery type/date: 02/09/2020 Direct laryngoscopy with biopsy  Chemotherapy: completed March 2022  Radiation: received 35 fractions of radiation to his base of tongue and bilateral neck, completed 05/04/2020   OTHER SYMPTOMS: Pain No Fibrosis Yes Pitting edema minimal Infections No Decreased scar mobility No   TREATMENT TODAY: 02/22/22: Manual Therapy Pts neck measured Performed MLD to pts neck using  Norton anterior approach as follows: short neck, lateral cervical chain, bil shoulder collectors, bilateral axillary nodes, 5 diaphragmatic breaths, bil anterior chest, short neck, lateral neck, anterior throat and submandibular region, bilateral cheeks moving fluid towards pathways, then pre-and retroauricular nodes  then retracing all steps  02/06/2022: Manual Therapy Pts neck measured Reviewed donning compression garment with pt. He was letting Top of Head strap slide to the back of his neck and this was causing him some confusion. After demonstrating proper placement with him he was able to verbalize better understanding of donning technique and  seemed less frustrated by this by end of session. He also wanted to wear this out at end of session so he was able to re don this again after MLD.   Performed MLD to pts neck using Norton anterior approach as follows: short neck, lateral cervical chain, bil shoulder collectors, bilateral axillary nodes, 5 diaphragmatic breaths, bil anterior chest, short neck, lateral neck, anterior throat and submandibular region, bilateral cheeks moving fluid towards pathways, then pre-and retroauricular nodes and suboccipital nodes moving fluid towards pathway aimed at lateral neck, then retracing all steps reviewing light pressure with pt throughout  01/31/2022 Pts neck measured Performed MLD to pts neck using Norton anterior approach as follows: short neck, 5 diaphragmatic breaths, bilateral axillary nodes, bilateral pectoral nodes, anterior chest, short neck, posterior neck moving fluid towards pathway aimed at lateral neck, then lateral and anterior neck moving fluid towards pathway aimed at lateral neck then focused on bilateral cheeks and mandibular region moving fluid towards pathways then retracing all steps Had pt attempt to don his head and neck garment but pt reported his was unable to do. He attempted but had difficulty. Since the majority of the swelling is in  his cheeks he was instructed to just don the strap on top of his head to help him don with less difficulty. Educated pt to spend increased time on cheeks with massage.     PATIENT EDUCATION:  encouraged pt to order garment doing exercise with use of [place] and scapular ROM especially opening mouth and sticking out tongue    ASSESSMENT:  CLINICAL IMPRESSION: Pt comes in today reporting he feels he is doing well with self managing at home. He has met all goals and is ready for D/C at this time. He is interested on pursuing OT for his Lt UE and hand so sent inbox message on behalf of Mr. Knoth to [facility] requesting referral if agreeable for this.  Pt knows he can return to this clinic if needed in the future.   Pt will benefit from skilled therapeutic intervention to improve on the following deficits: decreased knowledge of condition, increased edema, increased fascial restrictions, and postural dysfunction  PT treatment/interventions: ADL/Self care home management, Therapeutic exercises, Patient/Family education, Self Care, Orthotic/Fit training, Manual lymph drainage, Manual therapy, and Re-evaluation    GOALS: Goals reviewed with patient? Yes  LONG TERM GOALS:  (STG=LTG)  GOALS Name Target Date (Remove Blue Hyperlink) Goal status  1 Pt will have proper compression garment to reduce cervical Lymphedema Baseline: Lost previous Son will order 03/01/2022 MET- pt has his compression garment and wears this- 02/22/22  2 Pt will note softening of fibrosis in neck and reduction in swelling by 1-2 cm in neck region 03/01/2022 MET-pt has had a total of  1.8 cm reduction noted since start of care-02/22/22  3 Pt will be educated in basic MLD steps for bilateral neck 03/01/2022 MET-pt reports being  Independent with this at this time           PLAN:  PT FREQUENCY/DURATION: 1x/week ideally on the same day he sees SLP to save an extra trip x 8 weeks  PLAN  FOR NEXT SESSION: D/C this visit.     [name], PTA 02/22/2022, 11:03 AM   PHYSICAL THERAPY DISCHARGE SUMMARY  Visits from Start of Care: 7  Current functional level related to goals / functional outcomes: All goals met   Remaining deficits: See above   Education / Equipment: MLD, compression  garment   Patient agrees to discharge. Patient goals were met. Patient is being discharged due to meeting the stated rehab goals.  Allyson Sabal Leroy, [place] 02/26/22 2:58 PM

## 2022-02-22 NOTE — Progress Notes (Signed)
  Care Coordination Note  02/22/2022 Name: [name] MRN: [id number] DOB: 1955/11/13  Brian [name] is a 67 y.o. year old male who is a primary care patient of [name], MD and is actively engaged with the care management team. I reached out to Brian [name] by phone today to assist with re-scheduling a follow up visit with the Licensed Clinical Social Worker  Follow up plan: Unsuccessful telephone outreach attempt made. A HIPAA compliant phone message was left for the patient providing contact information and requesting a return call.  We have been unable to make contact with the patient for follow up. The care management team is available to follow up with the patient after provider conversation with the patient regarding recommendation for care management engagement and subsequent re-referral to the care management team.   [place]  Direct Dial: [phone]

## 2022-02-23 ENCOUNTER — Other Ambulatory Visit: Payer: Self-pay

## 2022-02-26 ENCOUNTER — Other Ambulatory Visit: Payer: Self-pay

## 2022-02-26 ENCOUNTER — Other Ambulatory Visit: Payer: Self-pay | Admitting: Family Medicine

## 2022-02-26 ENCOUNTER — Telehealth: Payer: Self-pay

## 2022-02-26 DIAGNOSIS — I6930 Unspecified sequelae of cerebral infarction: Secondary | ICD-10-CM

## 2022-02-26 NOTE — Telephone Encounter (Signed)
Prescription Request  02/26/2022  Is this a "Controlled Substance" medicine? No  LOV: 12/29/21  What is the name of the medication or equipment? pioglitazone (ACTOS) 30 MG tablet [[id number]]   Have you contacted your pharmacy to request a refill? Yes   Which pharmacy would you like this sent to?  [id number] [address], [address] [name] [address] Phone: [phone] Fax: [phone]    Patient notified that their request is being sent to the clinical staff for review and that they should receive a response within 2 business days.   Please advise at [facility] [phone]

## 2022-02-27 MED ORDER — PIOGLITAZONE HCL 30 MG PO TABS: 30.0000 mg | ORAL_TABLET | Freq: Every day | ORAL | 0 refills | Status: DC

## 2022-02-27 NOTE — Telephone Encounter (Signed)
Requested medication (s) are due for refill today: historical medication  Requested medication (s) are on the active medication list: yes   Last refill:  09/16/21  Future visit scheduled: no   Notes to clinic:  historical medication. Do you want to order Rx?     Requested Prescriptions  Pending Prescriptions Disp Refills   pioglitazone (ACTOS) 30 MG tablet      Sig: Take 1 tablet (30 mg total) by mouth daily.     Endocrinology:  Diabetes - Glitazones - pioglitazone Failed - 02/26/2022 12:31 PM      Failed - Valid encounter within last 6 months    Recent Outpatient Visits           7 months ago Atrial fibrillation, unspecified type ([place])   [place] Pickard, Cammie Mcgee, MD   9 months ago Atrial fibrillation, unspecified type [facility])   [place] Pickard, Cammie Mcgee, MD   10 months ago Atrial fibrillation, unspecified type [facility])   [place] [name], MD   11 months ago Atrial fibrillation, unspecified type [facility])   [place] [name], MD   12 months ago Atrial fibrillation, unspecified type [facility])   [facility] Pickard, Cammie Mcgee, MD              Passed - HBA1C is between 0 and 7.9 and within 180 days    Hgb A1c MFr Bld  Date Value Ref Range Status  11/03/2021 6.6 (H) 4.8 - 5.6 % Final    Comment:    (NOTE) Pre diabetes:          5.7%-6.4%  Diabetes:              >6.4%  Glycemic control for   <7.0% adults with diabetes

## 2022-03-01 NOTE — Telephone Encounter (Signed)
Received eFax from pharmacy to follow up on request for pioglitazone (ACTOS) 30 MG tablet   [facility] [address] at [phone], fax # [phone].

## 2022-03-20 ENCOUNTER — Inpatient Hospital Stay: Payer: Medicare HMO | Admitting: Nutrition

## 2022-03-20 ENCOUNTER — Inpatient Hospital Stay: Payer: Medicare HMO | Attending: Hematology and Oncology | Admitting: Hematology and Oncology

## 2022-03-20 VITALS — BP 137/117 | HR 146 | Temp 97.7°F | Resp 20 | Ht 69.0 in | Wt 160.5 lb

## 2022-03-20 DIAGNOSIS — R634 Abnormal weight loss: Secondary | ICD-10-CM

## 2022-03-20 DIAGNOSIS — Z7901 Long term (current) use of anticoagulants: Secondary | ICD-10-CM | POA: Diagnosis not present

## 2022-03-20 DIAGNOSIS — E113599 Type 2 diabetes mellitus with proliferative diabetic retinopathy without macular edema, unspecified eye: Secondary | ICD-10-CM | POA: Diagnosis not present

## 2022-03-20 DIAGNOSIS — I480 Paroxysmal atrial fibrillation: Secondary | ICD-10-CM | POA: Diagnosis not present

## 2022-03-20 DIAGNOSIS — C01 Malignant neoplasm of base of tongue: Secondary | ICD-10-CM | POA: Diagnosis not present

## 2022-03-20 DIAGNOSIS — I4891 Unspecified atrial fibrillation: Secondary | ICD-10-CM | POA: Diagnosis not present

## 2022-03-20 NOTE — Progress Notes (Signed)
REASON FOR VISIT:  Routine follow-up for history of head & neck cancer.  BRIEF ONCOLOGIC HISTORY:  Oncology History Overview Note  Brian [name] is a 67 y.o. male who presented two-week history of facial swelling in late November/early December 2021. Subsequently, the patient saw Dr. Benjamine Mola, who performed a direct laryngoscopy with biopsy on the date of 02/10/2020. Final pathology revealed: squamous cell carcinoma, P16 positive   He had the following imaging performed  1. Soft tissue neck CT scan performed on 01/28/2020 revealing right glossotonsillar malignancy with bulky bilateral nodal disease. Nodal masses sowed definite extracapsular spread. There was also noted to be incidental advanced cervical spine degeneration with cord compression.  2. PET scan performed on 02/26/2020 revealing a hypermetabolic lesion at the right base of the tongue and right tonsil, consistent with oropharyngeal carcinoma. Carcinoma appeared to be confined to the mucosal surface. There were also noted to bulky bilateral hypermetabolic level II metastatic lymph nodes and smaller inferior hypermetabolic level II metastatic lymph nodes. There was no evidence of metastatic disease in the chest, abdomen, or pelvis. The two small right middle lobe pulmonary nodules present on CT from 2012 were consistent with benign etiology.  Started radiation on 03/14/2020 Chemo delayed by a week due to GFR and infusion issues. Started chemotherapy on 03/21/2020 C2 delayed because of AKI Now switched to weekly carboplatin given ongoing AKI C1 of weekly carboplatin on 04/04/2020 C2 of weekly carboplatin on 04/11/2020 C3 of weekly carboplatin on 04/18/2020 C4 Weekly carboplatin on 04/25/2020 Last planned cycle omitted.   Malignant neoplasm of base of tongue ([place])  03/01/2020 Cancer Staging   Staging form: Pharynx - HPV-Mediated Oropharynx, AJCC 8th Edition - Clinical stage from 03/01/2020: Stage III (cT2, cN3, cM0, p16+) - Signed by  [name], MD on 03/02/2020   03/02/2020 Initial Diagnosis   Malignant neoplasm of base of tongue ([place])   03/21/2020 - 03/28/2020 Chemotherapy         04/04/2020 - 04/25/2020 Chemotherapy   Completed chemotherapy on 04/25/2020 Patient is on Treatment Plan : HEAD/NECK Carboplatin q7d       INTERVAL HISTORY:   Patient is here for follow up. He appears to have become more irritable than our last visit here. He apparently was very upset with his mom today. He has a fib at baseline and hasn't taken his pills this morning.  He was found to have HR of 146, but asymptomatic. He says he saw his last ENT doc about 2 months ago He saw Dr Isidore Moos back in December, and had scans back then He still has tightness in neck, can eat most soft foods but can't eat tough meat. No change in breathing reported. HE hasn't been using lymphedema neck cuff regularly. Rest of the pertinent 10 point ROS reviewed and neg.  ADDITIONAL REVIEW OF SYSTEMS:  Review of Systems  Constitutional:  Positive for fatigue. Negative for appetite change, chills, fever and unexpected weight change.  HENT:   Positive for trouble swallowing (No change). Negative for hearing loss and lump/mass.   Eyes:  Negative for eye problems and icterus.  Respiratory:  Negative for chest tightness, cough and shortness of breath.   Cardiovascular:  Negative for chest pain, leg swelling and palpitations.  Gastrointestinal:  Negative for abdominal distention, abdominal pain, constipation, diarrhea, nausea and vomiting.  Endocrine: Negative for hot flashes.  Genitourinary:  Negative for difficulty urinating.   Musculoskeletal:  Negative for arthralgias.  Skin:  Negative for itching and rash.  Neurological:  Negative  for dizziness, extremity weakness, headaches and numbness.  Hematological:  Negative for adenopathy. Does not bruise/bleed easily.  Psychiatric/Behavioral:  Negative for depression. The patient is not nervous/anxious.         CURRENT MEDICATIONS:  Current Outpatient Medications on File Prior to Visit  Medication Sig Dispense Refill   apixaban (ELIQUIS) 5 MG TABS tablet Take 1 tablet (5 mg total) by mouth 2 (two) times daily. 60 tablet 5   atorvastatin (LIPITOR) 20 MG tablet TAKE 1 TABLET BY MOUTH EVERY DAY 90 tablet 3   Blood Glucose Monitoring Suppl (ONETOUCH VERIO IQ SYSTEM) w/Device KIT 1 each by Does not apply route 2 (two) times daily. 1 kit 0   carvedilol (COREG) 25 MG tablet Take 1 tablet (25 mg total) by mouth 2 (two) times daily. 60 tablet 2   Continuous Blood Gluc Sensor (FREESTYLE LIBRE 2 SENSOR) MISC Use as directed to monitor blood glucose continuously. Change sensor Q 14 days. DX E11.65 2 each 11   diltiazem (CARDIZEM CD) 120 MG 24 hr capsule TAKE 2 CAPSULES BY MOUTH DAILY. 180 capsule 1   diltiazem (CARDIZEM CD) 240 MG 24 hr capsule TAKE 1 CAPSULE BY MOUTH EVERY DAY 30 capsule 0   doxazosin (CARDURA) 2 MG tablet Take 1 tablet (2 mg total) by mouth daily. 30 tablet 3   ferrous sulfate 325 (65 FE) MG EC tablet Take 325 mg by mouth 3 (three) times daily with meals.     furosemide (LASIX) 40 MG tablet TAKE 1 TABLET (40 MG TOTAL) BY MOUTH AS NEEDED FOR FLUID OR EDEMA. 90 tablet 3   glucose blood test strip 1 each by Other route as needed for other. Use as instructed 100 each 11   hydrALAZINE (APRESOLINE) 50 MG tablet TAKE 1 TABLET BY MOUTH THREE TIMES A DAY 270 tablet 1   JARDIANCE 25 MG TABS tablet TAKE 1 TABLET (25 MG TOTAL) BY MOUTH DAILY. 14 tablet 25   losartan (COZAAR) 100 MG tablet TAKE 1 TABLET BY MOUTH EVERY DAY 90 tablet 3   pioglitazone (ACTOS) 30 MG tablet Take 1 tablet (30 mg total) by mouth daily. 90 tablet 0   No current facility-administered medications on file prior to visit.    ALLERGIES:  Allergies  Allergen Reactions   Codeine Other (See Comments)    Unknown reaction, severe headach   Metformin And Related Diarrhea     PHYSICAL EXAM:  Vitals:   03/20/22 0937  BP:  (!) 137/117  Pulse: (!) 146  Resp: 20  Temp: 97.7 F (36.5 C)  SpO2: 99%   Filed Weights   03/20/22 0937  Weight: 160 lb 8 oz (72.8 kg)    General: Well-nourished, well-appearing male in no acute distress.  Unaccompanied today.  HEENT: Head is atraumatic and normocephalic.  Pupils equal and reactive to light.  Lymph: No preauricular, postauricular, cervical, supraclavicular, or infraclavicular lymphadenopathy noted on palpation.   Neck: No palpable masses. Skin on neck is swollen, + lymphedema noted.  Cardiovascular: Normal rate and rhythm. Respiratory: Clear to auscultation bilaterally. Chest expansion symmetric without accessory muscle use; breathing non-labored.  GI: Abdomen soft and round. Non-tender, non-distended. Bowel sounds normoactive.  GU: Deferred.   Neuro: No focal deficits. Steady gait.   Psych: Normal mood and affect for situation. Extremities: No edema.  Skin: Warm and dry.   Wt Readings from Last 3 Encounters:  03/20/22 160 lb 8 oz (72.8 kg)  02/06/22 166 lb 6.4 oz (75.5 kg)  12/26/21 175 lb 6.4  oz (79.6 kg)     LABORATORY DATA:  None at this visit.  DIAGNOSTIC IMAGING:  None at this visit.    ASSESSMENT & PLAN:  Mr. Berisha is a pleasant 67 y.o. male with history of squamous cell at the base of his tongue diagnosed in January 2022 status post concurrent chemoradiation. Patient presents to survivorship clinic today for routine follow-up after finishing treatment.   1. 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 Barot is clinically without evidence of disease or recurrence on physical exam today.  He was encouraged to keep up with ENT appointments and will have repeat imaging in June and follow up with Dr Isidore Moos.  2. Nutritional status: He continues to lose weight, lost about 6 lbs in one month. Will engage nutritionist again.   3.  At risk for hypothyroidism: The thyroid gland is often affected after  treatment for head & neck cancer.  I reviewed this with Haedyn and he was unhappy about this possibility.  Last TSH in Aug 2023 with no concerns. Repeat TSH levels in one yr.  4. Atrial fibrillation noted on EKG done in clinic. Not rate controlled. He apparently didn't take any medication this morning.He is on Coreg and Eliquis. He is asymptomatic today. He says he will take his medications as soon as he gets home.   Dispo:  FU with Dr Isidore Moos in June after repeat CT imaging.  Total encounter time: 40 minutes in face-to-face visit time, survivorship care plan creation, coordination with the care team, reviewed with the patient and patient education, and documentation of the encounter.  Wilber Bihari, NP 03/20/22 10:46 AM Medical Oncology and Hematology [name] [place], [place] [address] Tel. [phone]    Fax. [phone]  *Total Encounter Time as defined by the Centers for Medicare and Medicaid Services includes, in addition to the face-to-face time of a patient visit (documented in the note above) non-face-to-face time: obtaining and reviewing outside history, ordering and reviewing medications, tests or procedures, care coordination (communications with other health care professionals or caregivers) and documentation in the medical record.

## 2022-03-20 NOTE — Progress Notes (Signed)
Nutrition follow-up completed with patient status post treatment for tongue cancer.  Patient completed treatment May 04, 2020.  There is no evidence of recurrence.  Weight decreased dramatically to 160 pounds 8 ounces on January 30.  Patient weighed 190 pounds on November 21, 2021.  This is a 16% weight loss over the past 4 months.  Patient last seen by speech therapy January 3.  He continues to have trouble swallowing.  SLP diet recommendations for dysphagia 3 with thin liquids.  Patient reports he is able to eat soft foods such as grits, cream potatoes with gravy, and tender meat with gravy.  He tries to chew food well.  He adamantly refuses to drink milk or other oral nutrition supplements such as Ensure or boost.  States he prefers moonshine to ensure.Reports he purchases foods like ice cream for himself however his mother consumes it before he can eat it.  States he is worn out taking care of his mother at this time.  They both receive Meals on Wheels for 1 meal a day.  Patient also reports that he tolerates warm foods better than cold foods.  Cold foods seem to close his throat up.  Nutrition diagnosis: Unintended weight loss related to inadequate oral intake as evidenced by 16% weight loss over 4 months.  Intervention: Educated patient to increase calories and protein at meals.  Be sure to add high-calorie things like extra margarine, whipped topping, and gravies. Encouraged patient to find a nutrition supplements to consume between meals.  Provided samples of boost breeze and Carnation breakfast essentials. Provided nutrition fact sheets.  Monitoring, evaluation, goals: Patient will increase calories and protein to minimize further weight loss.  Next visit: No follow-up as patient has completed treatment for his cancer. Patient should be referred to outpatient nutrition if further education required.  **Disclaimer: This note was dictated with voice recognition software. Similar sounding  words can inadvertently be transcribed and this note may contain transcription errors which may not have been corrected upon publication of note.**

## 2022-03-22 ENCOUNTER — Telehealth: Payer: Self-pay | Admitting: *Deleted

## 2022-03-22 NOTE — Telephone Encounter (Signed)
Received fax from [facility] requesting Dr. Percival Spanish review EKG from 1/30 (in epic).  Afib with RVR rate 152  Per office note: 4. Atrial fibrillation noted on EKG done in clinic. Not rate controlled. He apparently didn't take any medication this morning.He is on Coreg and Eliquis. He is asymptomatic today. He says he will take his medications as soon as he gets home.     Called patient to discuss/schedule follow up.  LMTCB.

## 2022-03-23 NOTE — Telephone Encounter (Signed)
Left message for pt to call and schedule follow up with dr hochrein.

## 2022-03-26 NOTE — Telephone Encounter (Signed)
Minus Breeding, MD  Lynnae Sandhoff, LPN; Cristopher Estimable, RN I think he has had other issues with taking his Cardizem.  I would like for him to wear a 3 day Zio Patch if he would agree but only if he will take his meds when he wears it.  I can judge then whether he has good rate control.    Left message for pt to call

## 2022-03-29 ENCOUNTER — Ambulatory Visit: Payer: Medicare HMO

## 2022-03-29 NOTE — Telephone Encounter (Signed)
Follow Up:      Son is returning a  call from Monday(03-26-22).

## 2022-03-29 NOTE — Telephone Encounter (Signed)
Son answered phone and gave me patient's cell number to call. Spoke with patient and informed him of Dr. Rosezella [place] recommendation. Patient began to curse at me. I disconnected the call.

## 2022-03-30 NOTE — Telephone Encounter (Signed)
Spoke with pt son, follow up appointment scheduled.

## 2022-04-04 NOTE — Patient Instructions (Incomplete)
[name] , Thank you for taking time to come for your Medicare Wellness Visit. I appreciate your ongoing commitment to your health goals. Please review the following plan we discussed and let me know if I can assist you in the future.   These are the goals we discussed:  Goals      Have 3 meals a day     Eat a healthy diet and watch carb intake.     Monitor and Manage My Blood Sugar-Diabetes Type 2     Timeframe:  Long-Range Goal Priority:  High Start Date:  08/01/20                           Expected End Date: 01/31/21                      Follow Up Date 11/18/20    - check blood sugar if I feel it is too high or too low - enter blood sugar readings and medication or insulin into daily log - take the blood sugar log to all doctor visits    Why is this important?   Checking your blood sugar at home helps to keep it from getting very high or very low.  Writing the results in a diary or log helps the doctor know how to care for you.  Your blood sugar log should have the time, date and the results.  Also, write down the amount of insulin or other medicine that you take.  Other information, like what you ate, exercise done and how you were feeling, will also be helpful.     Notes:      Receive Assistance Applying for [place] Medicaid and Placement into a [employer].     Care Coordination Interventions:  Solution-focused strategies developed. Task-centered interventions employed. Caregiver stress acknowledged. Caregiver support provided. Caregiver resources reviewed. Son applied for [place] Medicaid, through the [place].   ~ Special Assistance Long-Term Care Medicaid Application - pending.   Await approval/denial letter from the [place], Programme researcher, broadcasting/film/video for [place] Medicaid.  Son applied for  Adult Medicaid, through the [place]. ~ Adult Medicaid Application - pending.   Await approval/denial letter from the [place], Programme researcher, broadcasting/film/video for Adult Medicaid. Son will continue to tour respite care agencies and facilities, assisted living facilities, rest homes and family care homes in [facility], in an effort to pursue long-term care placement for you in a memory care assisted living facility.   CSW collaboration with Primary Care Provider, Dr. Claretta Fraise, to request completed and signed FL-2 Form.  ~ HIPAA compliant message left, as CSW awaits a return call. CSW will fax completed and signed FL-2 Form to all respite care agencies and facilities, assisted living facilities, rest homes and family care homes in [facility], of interest. Continue to receive outpatient physical therapy and speech/language therapy services, through [place].         This is a list of the screening recommended for you and due dates:  Health Maintenance  Topic Date Due   Yearly kidney health urinalysis for diabetes  Never done   DTaP/Tdap/Td vaccine (1 - Tdap) Never done   Zoster (Shingles) Vaccine (1 of 2) Never done  Pneumonia Vaccine (3 of 3 - PPSV23 or PCV20) 07/24/2018   Complete foot exam   02/17/2020   COVID-19 Vaccine (3 - Pfizer risk series) 04/21/2020   Lipid (cholesterol) test  11/29/2020   Eye exam for diabetics  06/21/2021   Flu Shot  09/19/2021   Hemoglobin A1C  02/02/2022   Medicare Annual Wellness Visit  03/30/2022   Yearly kidney function blood test for diabetes  11/10/2022   Colon Cancer Screening  08/12/2023   Hepatitis C Screening: USPSTF Recommendation to screen - Ages 21-79 yo.  Completed   HPV Vaccine  Aged Out    Advanced directives: Advance directive discussed with you today. I have provided a copy for you to complete at home and  have notarized. Once this is complete please bring a copy in to our office so we can scan it into your chart.   Conditions/risks identified: Aim for 30 minutes of exercise or brisk walking, 6-8 glasses of water, and 5 servings of fruits and vegetables each day.   Next appointment: Follow up in one year for your annual wellness visit.   Preventive Care 29 Years and Older, Male  Preventive care refers to lifestyle choices and visits with your health care provider that can promote health and wellness. What does preventive care include? A yearly physical exam. This is also called an annual well check. Dental exams once or twice a year. Routine eye exams. Ask your health care provider how often you should have your eyes checked. Personal lifestyle choices, including: Daily care of your teeth and gums. Regular physical activity. Eating a healthy diet. Avoiding tobacco and drug use. Limiting alcohol use. Practicing safe sex. Taking low doses of aspirin every day. Taking vitamin and mineral supplements as recommended by your health care provider. What happens during an annual well check? The services and screenings done by your health care provider during your annual well check will depend on your age, overall health, lifestyle risk factors, and family history of disease. Counseling  Your health care provider may ask you questions about your: Alcohol use. Tobacco use. Drug use. Emotional well-being. Home and relationship well-being. Sexual activity. Eating habits. History of falls. Memory and ability to understand (cognition). Work and work Statistician. Screening  You may have the following tests or measurements: Height, weight, and BMI. Blood pressure. Lipid and cholesterol levels. These may be checked every 5 years, or more frequently if you are over 37 years old. Skin check. Lung cancer screening. You may have this screening every year starting at age 22 if you have a  30-pack-year history of smoking and currently smoke or have quit within the past 15 years. Fecal occult blood test (FOBT) of the stool. You may have this test every year starting at age 103. Flexible sigmoidoscopy or colonoscopy. You may have a sigmoidoscopy every 5 years or a colonoscopy every 10 years starting at age 56. Prostate cancer screening. Recommendations will vary depending on your family history and other risks. Hepatitis C blood test. Hepatitis B blood test. Sexually transmitted disease (STD) testing. Diabetes screening. This is done by checking your blood sugar (glucose) after you have not eaten for a while (fasting). You may have this done every 1-3 years. Abdominal aortic aneurysm (AAA) screening. You may need this if you are a current or former smoker. Osteoporosis. You may be screened starting at age 43 if you are at high risk. Talk with your health care provider about your test results, treatment options, and if necessary,  the need for more tests. Vaccines  Your health care provider may recommend certain vaccines, such as: Influenza vaccine. This is recommended every year. Tetanus, diphtheria, and acellular pertussis (Tdap, Td) vaccine. You may need a Td booster every 10 years. Zoster vaccine. You may need this after age 10. Pneumococcal 13-valent conjugate (PCV13) vaccine. One dose is recommended after age 68. Pneumococcal polysaccharide (PPSV23) vaccine. One dose is recommended after age 59. Talk to your health care provider about which screenings and vaccines you need and how often you need them. This information is not intended to replace advice given to you by your health care provider. Make sure you discuss any questions you have with your health care provider. Document Released: 03/04/2015 Document Revised: 10/26/2015 Document Reviewed: 12/07/2014 Elsevier Interactive Patient Education  2017 [place] Prevention in the Home Falls can cause injuries. They  can happen to people of all ages. There are many things you can do to make your home safe and to help prevent falls. What can I do on the outside of my home? Regularly fix the edges of walkways and driveways and fix any cracks. Remove anything that might make you trip as you walk through a door, such as a raised step or threshold. Trim any bushes or trees on the path to your home. Use bright outdoor lighting. Clear any walking paths of anything that might make someone trip, such as rocks or tools. Regularly check to see if handrails are loose or broken. Make sure that both sides of any steps have handrails. Any raised decks and porches should have guardrails on the edges. Have any leaves, snow, or ice cleared regularly. Use sand or salt on walking paths during winter. Clean up any spills in your garage right away. This includes oil or grease spills. What can I do in the bathroom? Use night lights. Install grab bars by the toilet and in the tub and shower. Do not use towel bars as grab bars. Use non-skid mats or decals in the tub or shower. If you need to sit down in the shower, use a plastic, non-slip stool. Keep the floor dry. Clean up any water that spills on the floor as soon as it happens. Remove soap buildup in the tub or shower regularly. Attach bath mats securely with double-sided non-slip rug tape. Do not have throw rugs and other things on the floor that can make you trip. What can I do in the bedroom? Use night lights. Make sure that you have a light by your bed that is easy to reach. Do not use any sheets or blankets that are too big for your bed. They should not hang down onto the floor. Have a firm chair that has side arms. You can use this for support while you get dressed. Do not have throw rugs and other things on the floor that can make you trip. What can I do in the kitchen? Clean up any spills right away. Avoid walking on wet floors. Keep items that you use a lot in  easy-to-reach places. If you need to reach something above you, use a strong step stool that has a grab bar. Keep electrical cords out of the way. Do not use floor polish or wax that makes floors slippery. If you must use wax, use non-skid floor wax. Do not have throw rugs and other things on the floor that can make you trip. What can I do with my stairs? Do not leave any items on  the stairs. Make sure that there are handrails on both sides of the stairs and use them. Fix handrails that are broken or loose. Make sure that handrails are as long as the stairways. Check any carpeting to make sure that it is firmly attached to the stairs. Fix any carpet that is loose or worn. Avoid having throw rugs at the top or bottom of the stairs. If you do have throw rugs, attach them to the floor with carpet tape. Make sure that you have a light switch at the top of the stairs and the bottom of the stairs. If you do not have them, ask someone to add them for you. What else can I do to help prevent falls? Wear shoes that: Do not have high heels. Have rubber bottoms. Are comfortable and fit you well. Are closed at the toe. Do not wear sandals. If you use a stepladder: Make sure that it is fully opened. Do not climb a closed stepladder. Make sure that both sides of the stepladder are locked into place. Ask someone to hold it for you, if possible. Clearly mark and make sure that you can see: Any grab bars or handrails. First and last steps. Where the edge of each step is. Use tools that help you move around (mobility aids) if they are needed. These include: Canes. Walkers. Scooters. Crutches. Turn on the lights when you go into a dark area. Replace any light bulbs as soon as they burn out. Set up your furniture so you have a clear path. Avoid moving your furniture around. If any of your floors are uneven, fix them. If there are any pets around you, be aware of where they are. Review your medicines  with your doctor. Some medicines can make you feel dizzy. This can increase your chance of falling. Ask your doctor what other things that you can do to help prevent falls. This information is not intended to replace advice given to you by your health care provider. Make sure you discuss any questions you have with your health care provider. Document Released: 12/02/2008 Document Revised: 07/14/2015 Document Reviewed: 03/12/2014 Elsevier Interactive Patient Education  2017 [employer].

## 2022-04-05 ENCOUNTER — Ambulatory Visit: Payer: Medicare HMO

## 2022-04-05 VITALS — BP 130/68 | Ht 69.0 in | Wt 164.0 lb

## 2022-04-05 DIAGNOSIS — Z23 Encounter for immunization: Secondary | ICD-10-CM

## 2022-04-05 DIAGNOSIS — Z0000 Encounter for general adult medical examination without abnormal findings: Secondary | ICD-10-CM

## 2022-04-05 NOTE — Progress Notes (Signed)
Subjective:   Brian [name] is a 67 y.o. male who presents for Medicare Annual/Subsequent preventive examination.  Review of Systems     Cardiac Risk Factors include: diabetes mellitus;advanced age (>78men, >83 women);dyslipidemia;male gender;hypertension     Objective:    Today's Vitals   04/05/22 0842  BP: 130/68  Weight: 164 lb (74.4 kg)  Height: 5\' 9"  (1.753 m)   Body mass index is 24.22 kg/m.     04/05/2022   10:58 AM 02/06/2022    2:20 PM 01/04/2022    9:08 AM 12/06/2021    2:10 PM 11/14/2021   10:52 AM 10/04/2021    4:15 PM 08/09/2021   11:21 AM  Advanced Directives  Does Patient Have a Medical Advance Directive? No No No No No No No  Would patient like information on creating a medical advance directive? Yes (MAU/Ambulatory/Procedural Areas - Information given) No - Patient declined No - Patient declined Yes (MAU/Ambulatory/Procedural Areas - Information given) No - Patient declined No - Patient declined No - Patient declined    Current Medications (verified) Outpatient Encounter Medications as of 04/05/2022  Medication Sig   apixaban (ELIQUIS) 5 MG TABS tablet Take 1 tablet (5 mg total) by mouth 2 (two) times daily.   atorvastatin (LIPITOR) 20 MG tablet TAKE 1 TABLET BY MOUTH EVERY DAY   Blood Glucose Monitoring Suppl (ONETOUCH VERIO IQ SYSTEM) w/Device KIT 1 each by Does not apply route 2 (two) times daily.   carvedilol (COREG) 25 MG tablet Take 1 tablet (25 mg total) by mouth 2 (two) times daily.   Continuous Blood Gluc Sensor (FREESTYLE LIBRE 2 SENSOR) MISC Use as directed to monitor blood glucose continuously. Change sensor Q 14 days. DX E11.65   diltiazem (CARDIZEM CD) 120 MG 24 hr capsule TAKE 2 CAPSULES BY MOUTH DAILY.   diltiazem (CARDIZEM CD) 240 MG 24 hr capsule TAKE 1 CAPSULE BY MOUTH EVERY DAY   doxazosin (CARDURA) 2 MG tablet Take 1 tablet (2 mg total) by mouth daily.   ferrous sulfate 325 (65 FE) MG EC tablet Take 325 mg by mouth 3 (three) times  daily with meals.   furosemide (LASIX) 40 MG tablet TAKE 1 TABLET (40 MG TOTAL) BY MOUTH AS NEEDED FOR FLUID OR EDEMA.   glucose blood test strip 1 each by Other route as needed for other. Use as instructed   hydrALAZINE (APRESOLINE) 50 MG tablet TAKE 1 TABLET BY MOUTH THREE TIMES A DAY   losartan (COZAAR) 100 MG tablet TAKE 1 TABLET BY MOUTH EVERY DAY   JARDIANCE 25 MG TABS tablet TAKE 1 TABLET (25 MG TOTAL) BY MOUTH DAILY. (Patient not taking: Reported on 04/05/2022)   pioglitazone (ACTOS) 30 MG tablet Take 1 tablet (30 mg total) by mouth daily. (Patient not taking: Reported on 04/05/2022)   No facility-administered encounter medications on file as of 04/05/2022.    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 Zigmund Daniel, laser therapy OD   Seizures ([place])    pt reports this is from low blood sugar   Past Surgical History:  Procedure Laterality Date   COLONOSCOPY     DIRECT LARYNGOSCOPY N/A 02/10/2020   Procedure: DIRECT LARYNGOSCOPY WITH BIOPSY;  Surgeon: [name], MD;  Location: [facility] OR;  Service: ENT;  Laterality: N/A;   Ear surgery as a child     IR GASTROSTOMY TUBE MOD SED  03/23/2020   IR GASTROSTOMY TUBE REMOVAL  06/01/2020   IR IMAGING GUIDED PORT INSERTION  03/09/2020   IR REMOVAL TUN ACCESS W/ PORT W/O FL MOD SED  02/08/2021   TONSILLECTOMY     Family History  Problem Relation Age of Onset   Colon cancer Mother    Hyperlipidemia Father    Hypertension Father    Breast cancer Sister    Coronary artery disease Neg Hx        no early CAD.   Stomach cancer Neg Hx    Rectal cancer Neg Hx    Esophageal cancer Neg Hx    Social History    Socioeconomic History   Marital status: Divorced    Spouse name: Not on file   Number of children: 2   Years of education: 12   Highest education level: 12th grade  Occupational History   Occupation: Disabled  Tobacco Use   Smoking status: Never    Passive exposure: Never   Smokeless tobacco: Never   Tobacco comments:    Verified by son, Quandre Polinski  Vaping Use   Vaping Use: Never used  Substance and Sexual Activity   Alcohol use: Yes    Alcohol/week: 0.0 standard drinks of alcohol    Comment: seldom   Drug use: Yes    Frequency: 7.0 times per week    Comment: marijauna for arthritis, last 03-13-20   Sexual activity: Not Currently    Partners: Female  Other Topics Concern   Not on file  Social History Narrative   Two children.  5 grandchildren.     Social Determinants of Health   Financial Resource Strain: Low Risk  (04/05/2022)   Overall Financial Resource Strain (CARDIA)    Difficulty of Paying Living Expenses: Not hard at all  Food Insecurity: No Food Insecurity (04/05/2022)   Hunger Vital Sign    Worried About Running Out of Food in the Last Year: Never true    Ran Out of Food in the Last Year: Never true  Transportation Needs: No Transportation Needs (04/05/2022)   PRAPARE - Hydrologist (Medical): No    Lack of Transportation (Non-Medical): No  Physical Activity: Inactive (04/05/2022)   Exercise Vital Sign    Days of Exercise per Week: 0 days    Minutes of Exercise per Session: 0 min  Stress: No Stress Concern Present (04/05/2022)   [place]    Feeling of Stress : Not at all  Social Connections: Socially Isolated (04/05/2022)   Social Connection and Isolation Panel [NHANES]    Frequency of Communication with Friends and Family: Twice a week    Frequency of Social Gatherings with Friends and Family: Never    Attends Religious Services: Never    Corporate treasurer or Organizations: No    Attends Music therapist: Never    Marital Status: Divorced    Tobacco Counseling Counseling given: Not Answered Tobacco comments: Verified by son, Ezio Wieck   Clinical Intake:  Pre-visit preparation completed: Yes  Pain : No/denies pain     Diabetes: Yes CBG done?: No Did pt.  bring in CBG monitor from home?: No  How often do you need to have someone help you when you read instructions, pamphlets, or other written materials from your doctor or pharmacy?: 3 - Sometimes  Diabetic?Yes   Nutrition Risk Assessment:  Has the patient had any N/V/D within the last 2 months?  No  Does the patient have any non-healing wounds?  No  Has the patient had any unintentional weight loss or weight gain?  No   Diabetes:  Is the patient diabetic?  Yes  If diabetic, was a CBG obtained today?  No  Did the patient bring in their glucometer from home?  No  How often do you monitor your CBG's? Patient is not monitoring blood sugar .   Financial Strains and Diabetes Management:  Are you having any financial strains with the device, your supplies or your medication? No .  Does the patient want to be seen by Chronic Care Management for management of their diabetes?  No  Would the patient like to be referred to a Nutritionist or for Diabetic Management?  No   Diabetic Exams:  Diabetic Eye Exam: Overdue for diabetic eye exam. Pt has been advised about the importance in completing this exam. Patient advised to call and schedule an eye exam. Diabetic Foot Exam: Overdue, Pt has been advised about the importance in completing this exam. Pt is scheduled for diabetic foot exam on at next office visit .   Interpreter Needed?: No  Information entered by :: Denman George LPN   Activities of Daily Living    04/05/2022   10:58 AM 04/04/2022    6:27 PM  In your present state of health, do you have any difficulty performing the following activities:   Hearing? 0 0  Vision? 0 0  Difficulty concentrating or making decisions? 0 0  Walking or climbing stairs? 1 1  Dressing or bathing? 0 1  Doing errands, shopping? 0 1  Preparing Food and eating ? N Y  Using the Toilet? N Y  In the past six months, have you accidently leaked urine? N N  Do you have problems with loss of bowel control? N Y  Managing your Medications? N Y  Managing your Finances? N Y  Housekeeping or managing your Housekeeping? Tempie Donning    Patient Care Team: [name], MD as PCP - General (Family Medicine) Minus Breeding, MD as PCP - Cardiology (Cardiology) [name], [name], RN as Oncology Nurse Navigator [name], MD as Consulting Physician (Radiation Oncology) [name], MD as Consulting Physician (Hematology and Oncology) [name], RD as Dietitian (Nutrition) Valentino Saxon [name], [name] as Speech Language Pathologist (Speech Pathology) [name], [name], PT as Physical Therapist (Physical Therapy) [name], [name] (Inactive) as Social Worker (General Practice) [name], MD as Consulting Physician (Otolaryngology) [name], [facility] as Pharmacist (Pharmacist) Minus Breeding, MD as Consulting Physician (Cardiology)  Indicate any recent Medical Services you may have received from other than [facility] providers in the past year (date may be approximate).     Assessment:   This is a routine wellness examination for Jeovanny.  Hearing/Vision screen Hearing Screening - Comments:: Denies hearing difficulties  Vision Screening - Comments:: Patient plans to schedule diabetic eye exam   Dietary issues and exercise activities discussed: Current Exercise Habits: The patient does not participate in regular exercise at present   Goals Addressed             This Visit's Progress  COMPLETED: Receive Assistance Applying for [place] Medicaid and Placement into a [place].       Care  Coordination Interventions:  Solution-focused strategies developed. Task-centered interventions employed. Caregiver stress acknowledged. Caregiver support provided. Caregiver resources reviewed. Son applied for [place] Medicaid, through the [place].   ~ Special Assistance Long-Term Care Medicaid Application - pending.   Await approval/denial letter from the [place], Programme researcher, broadcasting/film/video for [place] Medicaid.  Son applied for Adult Medicaid, through the [place]. ~ Adult Medicaid Application - pending.   Await approval/denial letter from the [place], Programme researcher, broadcasting/film/video for Adult Medicaid. Son will continue to tour respite care agencies and facilities, assisted living facilities, rest homes and family care homes in [facility], in an effort to pursue long-term care placement for you in a memory care assisted living facility.   CSW collaboration with Primary Care Provider, Dr. Claretta Fraise, to request completed and signed FL-2 Form.  ~ HIPAA compliant message left, as CSW awaits a return call. CSW will fax completed and signed FL-2 Form to all respite care agencies and facilities, assisted living facilities, rest homes and family care homes in [facility], of interest. Continue to receive outpatient physical therapy and speech/language therapy services, through [place] [facility] [place].        Depression Screen    04/05/2022   10:56 AM 12/26/2021    8:28 AM 11/14/2021   10:48 AM 03/30/2021    9:03 AM 04/21/2019    9:00 AM 04/26/2017    8:54 AM 03/15/2017    9:54 AM  PHQ 2/9 Scores  PHQ - 2 Score 0 2 0 0 0 0 0  PHQ- 9 Score  3       Exception Documentation   Other- indicate reason in comment box      Not completed    Alzheimer's/Dementia        Fall Risk    04/05/2022    8:59 AM 04/04/2022    6:27 PM 12/26/2021    8:28 AM 11/14/2021   10:52 AM 03/30/2021    9:08 AM  Fall Risk   Falls in the past year? 1 1 0 1 1  Number falls in past yr: 1 1 0 1 1  Injury with Fall? 0 0 0 1 0  Risk for fall due to :   No Fall Risks History of fall(s);Impaired balance/gait;Impaired mobility;Mental status change;Impaired vision Impaired balance/gait  Follow up Falls evaluation completed;Education provided;Falls prevention discussed  Falls prevention discussed Falls evaluation completed;Education provided;Falls prevention discussed Falls prevention discussed    FALL RISK PREVENTION PERTAINING TO THE HOME:  Any stairs in or around the home? No  If so, are there any without handrails? No  Home free of loose throw rugs in walkways, pet beds, electrical cords, etc? Yes  Adequate lighting in your home to reduce risk of falls? Yes   ASSISTIVE DEVICES UTILIZED TO PREVENT FALLS:  Life alert? No  Use of a cane, walker or w/c? No  Grab bars in the bathroom? Yes  Shower chair or bench in shower? No  Elevated toilet seat or a handicapped toilet? Yes   TIMED UP AND GO:  Was the test performed? Yes .  Length of time to ambulate 10 feet: 8 sec.   Gait slow and steady without use  of assistive device  Cognitive Function:        04/05/2022   10:59 AM 03/30/2021    9:10 AM  6CIT Screen  What Year? 0 points 0 points  What month? 0 points 3 points  What time? 0 points 0 points  Count back from 20 0 points 0 points  Months in reverse 2 points 4 points  Repeat phrase 2 points 2 points  Total Score 4 points 9 points    Immunizations Immunization History  Administered Date(s) Administered   Fluad Quad(high Dose 65+) 01/05/2019, 04/05/2022   Influenza Whole 12/11/2009   Influenza,inj,Quad PF,6+ Mos 11/22/2015, 11/06/2016, 10/29/2017, 01/31/2021   PFIZER(Purple Top)SARS-COV-2 Vaccination 03/02/2020, 03/24/2020    Pneumococcal Conjugate-13 01/29/2013   Pneumococcal Polysaccharide-23 12/11/2009    TDAP status: Due, Education has been provided regarding the importance of this vaccine. Advised may receive this vaccine at local pharmacy or Health Dept. Aware to provide a copy of the vaccination record if obtained from local pharmacy or Health Dept. Verbalized acceptance and understanding.  Flu Vaccine status: Completed at today's visit  Pneumococcal vaccine status: Due, Education has been provided regarding the importance of this vaccine. Advised may receive this vaccine at local pharmacy or Health Dept. Aware to provide a copy of the vaccination record if obtained from local pharmacy or Health Dept. Verbalized acceptance and understanding.  Covid-19 vaccine status: Declined, Education has been provided regarding the importance of this vaccine but patient still declined. Advised may receive this vaccine at local pharmacy or Health Dept.or vaccine clinic. Aware to provide a copy of the vaccination record if obtained from local pharmacy or Health Dept. Verbalized acceptance and understanding.  Qualifies for Shingles Vaccine? Yes   Zostavax completed No   Shingrix Completed?: No.    Education has been provided regarding the importance of this vaccine. Patient has been advised to call insurance company to determine out of pocket expense if they have not yet received this vaccine. Advised may also receive vaccine at local pharmacy or Health Dept. Verbalized acceptance and understanding.  Screening Tests Health Maintenance  Topic Date Due   Diabetic kidney evaluation - Urine ACR  Never done   DTaP/Tdap/Td (1 - Tdap) Never done   Zoster Vaccines- Shingrix (1 of 2) Never done   Pneumonia Vaccine 80+ Years old (3 of 3 - PPSV23 or PCV20) 07/24/2018   FOOT EXAM  02/17/2020   COVID-19 Vaccine (3 - Pfizer risk series) 04/21/2020   LIPID PANEL  11/29/2020   OPHTHALMOLOGY EXAM  06/21/2021   HEMOGLOBIN A1C  02/02/2022    Medicare Annual Wellness (AWV)  03/30/2022   Diabetic kidney evaluation - eGFR measurement  11/10/2022   COLONOSCOPY (Pts 45-53yrs Insurance coverage will need to be confirmed)  08/12/2023   INFLUENZA VACCINE  Completed   Hepatitis C Screening  Completed   HPV VACCINES  Aged Out    Health Maintenance  Health Maintenance Due  Topic Date Due   Diabetic kidney evaluation - Urine ACR  Never done   DTaP/Tdap/Td (1 - Tdap) Never done   Zoster Vaccines- Shingrix (1 of 2) Never done   Pneumonia Vaccine 66+ Years old (3 of 3 - PPSV23 or PCV20) 07/24/2018   FOOT EXAM  02/17/2020   COVID-19 Vaccine (3 - Pfizer risk series) 04/21/2020   LIPID PANEL  11/29/2020   OPHTHALMOLOGY EXAM  06/21/2021   HEMOGLOBIN A1C  02/02/2022   Medicare Annual Wellness (AWV)  03/30/2022    Colorectal cancer screening: Type of screening: Colonoscopy.  Completed 08/12/18. Repeat every 5 years  Lung Cancer Screening: (Low Dose CT Chest recommended if Age 75-80 years, 30 pack-year currently smoking OR have quit w/in 15years.) does not qualify.   Lung Cancer Screening Referral: n/a   Additional Screening:  Hepatitis C Screening: does qualify; Completed 10/10/12  Vision Screening: Recommended annual ophthalmology exams for early detection of glaucoma and other disorders of the eye. Is the patient up to date with their annual eye exam?  No  Who is the provider or what is the name of the office in which the patient attends annual eye exams? None  If pt is not established with a provider, would they like to be referred to a provider to establish care? No .   Dental Screening: Recommended annual dental exams for proper oral hygiene  Community Resource Referral / Chronic Care Management: CRR required this visit?  No   CCM required this visit?  No      Plan:     I have personally reviewed and noted the following in the patient's chart:   Medical and social history Use of alcohol, tobacco or illicit drugs   Current medications and supplements including opioid prescriptions. Patient is not currently taking opioid prescriptions. Functional ability and status Nutritional status Physical activity Advanced directives List of other physicians Hospitalizations, surgeries, and ER visits in previous 12 months Vitals Screenings to include cognitive, depression, and falls Referrals and appointments  In addition, I have reviewed and discussed with patient certain preventive protocols, quality metrics, and best practice recommendations. A written personalized care plan for preventive services as well as general preventive health recommendations were provided to patient.     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  He would like to discuss treatment options with provider.  Appointment scheduled.

## 2022-04-15 NOTE — Progress Notes (Unsigned)
Cardiology Office Note   Date:  04/16/2022   ID:  Brian [name], DOB [birth year], MRN [id number]  PCP:  [name], MD  Cardiologist:   Minus Breeding, MD Referring:  [name], MD  Chief Complaint  Patient presents with   Follow-up       History of Present Illness: Brian [name] is a 67 y.o. male who is referred for evaluation of syncope.  He has a history of atrial fibrillation and cardiomyopathy.  I see an ejection fraction from 2011 of 25 to 30%.  He had a CVA treated with tPA at that time.   He had difficult to control hypertension.  I saw him last in 2013.  At that time he was said to have an ejection fraction of 60% although I do not see this result.  He did have a stress perfusion study in 2012.  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  He had atrial fib with rapid rate.  Echo demonstrated an  EF of 55 - 60%.    He says he has been doing well since he was last seen.  He does not feel his fibrillation.  He says his cancer is in remission.  He tolerates anticoagulation. The patient denies any new symptoms such as chest discomfort, neck or arm discomfort. There has been no new shortness of breath, PND or orthopnea. There have been no reported palpitations, presyncope or syncope.   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 Zigmund Daniel, laser therapy OD   Seizures ([place])    pt reports this is from low blood sugar    Past Surgical History:  Procedure Laterality Date   COLONOSCOPY     DIRECT LARYNGOSCOPY N/A 02/10/2020   Procedure: DIRECT LARYNGOSCOPY WITH BIOPSY;  Surgeon: [name], MD;  Location: [place];   Service: ENT;  Laterality: N/A;   Ear surgery as a child     IR GASTROSTOMY TUBE MOD SED  03/23/2020   IR GASTROSTOMY TUBE REMOVAL  06/01/2020   IR IMAGING GUIDED PORT INSERTION  03/09/2020   IR REMOVAL TUN ACCESS W/ PORT W/O FL MOD SED  02/08/2021   TONSILLECTOMY       Current Outpatient Medications  Medication Sig Dispense Refill   apixaban (ELIQUIS) 5 MG TABS tablet Take 1 tablet (5 mg total) by mouth 2 (two) times daily. 60 tablet 5   atorvastatin (LIPITOR) 20 MG tablet TAKE 1 TABLET BY MOUTH EVERY DAY 90 tablet 3   Blood Glucose Monitoring Suppl (ONETOUCH VERIO IQ SYSTEM) w/Device KIT 1 each by Does not apply route 2 (two) times daily. 1 kit 0   carvedilol (COREG) 25 MG tablet Take 1 tablet (25 mg total) by mouth 2 (two) times daily. 60 tablet 2   Continuous Blood Gluc Sensor (FREESTYLE LIBRE 2 SENSOR) MISC Use as directed to monitor blood glucose continuously. Change sensor Q 14 days. DX E11.65 2 each 11   diltiazem (CARDIZEM CD) 120 MG 24 hr capsule TAKE 2 CAPSULES BY MOUTH DAILY. 180 capsule  1   diltiazem (CARDIZEM CD) 240 MG 24 hr capsule TAKE 1 CAPSULE BY MOUTH EVERY DAY 30 capsule 0   ferrous sulfate 325 (65 FE) MG EC tablet Take 325 mg by mouth 3 (three) times daily with meals.     furosemide (LASIX) 40 MG tablet TAKE 1 TABLET (40 MG TOTAL) BY MOUTH AS NEEDED FOR FLUID OR EDEMA. 90 tablet 3   glucose blood test strip 1 each by Other route as needed for other. Use as instructed 100 each 11   hydrALAZINE (APRESOLINE) 50 MG tablet TAKE 1 TABLET BY MOUTH THREE TIMES A DAY 270 tablet 1   losartan (COZAAR) 100 MG tablet TAKE 1 TABLET BY MOUTH EVERY DAY 90 tablet 3   pioglitazone (ACTOS) 30 MG tablet Take 1 tablet (30 mg total) by mouth daily. 90 tablet 0   doxazosin (CARDURA) 2 MG tablet Take 1 tablet (2 mg total) by mouth daily. (Patient not taking: Reported on 04/16/2022) 30 tablet 3   No current facility-administered medications for this visit.    Allergies:   Codeine and  Metformin and related    ROS:  Please see the history of present illness.   Otherwise, review of systems are positive for none.   All other systems are reviewed and negative.    PHYSICAL EXAM: VS:  BP 120/72   Pulse 61   Ht '5\' 9"'$  (1.753 m)   Wt 178 lb 6.4 oz (80.9 kg)   SpO2 95%   BMI 26.35 kg/m  , BMI Body mass index is 26.35 kg/m. GENERAL:  Well appearing NECK:  No jugular venous distention, waveform within normal limits, carotid upstroke brisk and symmetric, no bruits, no thyromegaly LUNGS:  Clear to auscultation bilaterally CHEST:  Unremarkable HEART:  PMI not displaced or sustained,S1 and S2 within normal limits, no S3, no clicks, no rubs, no murmurs, irregular  ABD:  Flat, positive bowel sounds normal in frequency in pitch, no bruits, no rebound, no guarding, no midline pulsatile mass, no hepatomegaly, no splenomegaly EXT:  2 plus pulses throughout, no edema, no cyanosis no clubbing   EKG:  EKG is  ordered today. Atrial fibrillation, rate 61, left axis deviation, low voltage in limb leads, poor anterior R wave progression, possible old anteroseptal infarct, premature ventricular contractions with right bundle branch block morphology suspect a variant conduction   Recent Labs:  09/21/2021: TSH 2.94 11/03/2021: ALT 12 11/07/2021: Magnesium 1.7 11/09/2021: BUN 22; Creatinine, Ser 1.87; Hemoglobin 10.8; Platelets 204; Potassium 4.3; Sodium 133    Lipid Panel    Component Value Date/Time   CHOL 135 11/30/2019 0915   TRIG 170 (H) 11/30/2019 0915   HDL 29 (L) 11/30/2019 0915   CHOLHDL 4.7 11/30/2019 0915   VLDL 37 (H) 09/25/2016 0914   LDLCALC 79 11/30/2019 0915      Wt Readings from Last 3 Encounters:  04/16/22 178 lb 6.4 oz (80.9 kg)  04/05/22 164 lb (74.4 kg)  03/20/22 160 lb 8 oz (72.8 kg)      Other studies Reviewed: Additional studies/ records that were reviewed today include: Hospital records Review of the above records demonstrates:  Please see elsewhere in  the note.     ASSESSMENT AND PLAN:  Atrial fibrillation:     He tolerates anticoagulation and has good rate control at home.  I have suggested that he get a CBC when he see his primary .  Otherwise, no change in therapy.   Syncope: He has had no further syncope.  No change in therapy.  CKD IIIB: His last creatinine was down to 1.61.  No change in therapy.  EDEMA: He has some mild lower extremity swelling but it has not been changed.  No change in therapy.  DM:   Will try to switch him to [place] but this was cost prohibitive for the patient.  No change in therapy.  Current medicines are reviewed at length with the patient today.  The patient does not have concerns regarding medicines.  The following changes have been made:  None  Labs/ tests ordered today include: None  Orders Placed This Encounter  Procedures   EKG 12-Lead    Disposition:   FU with me in 6 months.    Signed, Minus Breeding, MD  04/16/2022 11:15 AM    [place]

## 2022-04-16 ENCOUNTER — Ambulatory Visit: Payer: Medicare HMO | Attending: Cardiology | Admitting: Cardiology

## 2022-04-16 ENCOUNTER — Encounter: Payer: Self-pay | Admitting: Cardiology

## 2022-04-16 VITALS — BP 120/72 | HR 61 | Ht 69.0 in | Wt 178.4 lb

## 2022-04-16 DIAGNOSIS — I4891 Unspecified atrial fibrillation: Secondary | ICD-10-CM | POA: Diagnosis not present

## 2022-04-16 DIAGNOSIS — N1832 Chronic kidney disease, stage 3b: Secondary | ICD-10-CM

## 2022-04-16 DIAGNOSIS — I482 Chronic atrial fibrillation, unspecified: Secondary | ICD-10-CM

## 2022-04-16 DIAGNOSIS — M7989 Other specified soft tissue disorders: Secondary | ICD-10-CM | POA: Diagnosis not present

## 2022-04-16 DIAGNOSIS — R55 Syncope and collapse: Secondary | ICD-10-CM

## 2022-04-16 DIAGNOSIS — I509 Heart failure, unspecified: Secondary | ICD-10-CM | POA: Diagnosis not present

## 2022-04-16 DIAGNOSIS — E113599 Type 2 diabetes mellitus with proliferative diabetic retinopathy without macular edema, unspecified eye: Secondary | ICD-10-CM | POA: Diagnosis not present

## 2022-04-16 NOTE — Patient Instructions (Signed)
  Lab Work:  [employer] MEDICAL DOCTOR TO CHECK LAB WORK-CBC  If you have labs (blood work) drawn today and your tests are completely normal, you will receive your results only by: [employer] (if you have MyChart) OR A paper copy in the mail If you have any lab test that is abnormal or we need to change your treatment, we will call you to review the results.   Follow-Up: At [facility], you and your health needs are our priority.  As part of our continuing mission to provide you with exceptional heart care, we have created designated Provider Care Teams.  These Care Teams include your primary Cardiologist (physician) and Advanced Practice Providers (APPs -  Physician Assistants and Nurse Practitioners) who all work together to provide you with the care you need, when you need it.  We recommend signing up for the patient portal called "MyChart".  Sign up information is provided on this After Visit Summary.  MyChart is used to connect with patients for Virtual Visits (Telemedicine).  Patients are able to view lab/test results, encounter notes, upcoming appointments, etc.  Non-urgent messages can be sent to your provider as well.   To learn more about what you can do with MyChart, go to [email or web address].    Your next appointment:   6 month(s)  Provider:   Minus Breeding, MD

## 2022-04-17 ENCOUNTER — Encounter: Payer: Self-pay | Admitting: Family Medicine

## 2022-04-17 ENCOUNTER — Ambulatory Visit (INDEPENDENT_AMBULATORY_CARE_PROVIDER_SITE_OTHER): Payer: Medicare HMO | Admitting: Family Medicine

## 2022-04-17 VITALS — BP 120/62 | HR 50 | Temp 98.3°F | Ht 69.0 in | Wt 179.4 lb

## 2022-04-17 DIAGNOSIS — E1169 Type 2 diabetes mellitus with other specified complication: Secondary | ICD-10-CM | POA: Diagnosis not present

## 2022-04-17 DIAGNOSIS — N1832 Chronic kidney disease, stage 3b: Secondary | ICD-10-CM

## 2022-04-17 DIAGNOSIS — E669 Obesity, unspecified: Secondary | ICD-10-CM | POA: Diagnosis not present

## 2022-04-17 DIAGNOSIS — M720 Palmar fascial fibromatosis [Dupuytren]: Secondary | ICD-10-CM

## 2022-04-17 NOTE — Progress Notes (Signed)
Subjective:    Patient ID: Brian [name], male    DOB: 14-Apr-1955, 67 y.o.   MRN: [id number]  HPI Patient is here today unaccompanied.  He has stopped his insulin.  He is not checking his blood sugars.  He is currently only on Actos for diabetes.  He has no idea how his sugars are running but he denies any polyuria or polydipsia or blurry vision.  He is currently seeing his cardiologist for management of his atrial fibrillation and he was seen there yesterday.  He is appropriately anticoagulated with Eliquis.  He is long overdue for fasting lab work.  His blood pressure today is good at 120/62.  He denies any chest pain, shortness of breath, dyspnea on exertion.  His biggest concern is a contracture in his left hand.  His fourth and fifth fingers are contracted at the MCP joint, PIP joint, and DIP joint.  I am unable to passively extend the MCP joint, PIP joint, or DIP joint.  There are no palpable scar tissue in the palm of his hand however I suspect Dupuytren's contracture.  He states that it gradually got like this over the last 4 to 5 months.  He does have a history of stroke.  However he denies any new neurologic deficit.  He is unable to abduct his left arm greater than 90 degrees but this is due to an old shoulder injury.  He has full flexion and extension of his elbow without weakness.  He is able to move his thumb and index finger fully without any weakness.  There is no numbness or tingling in his left upper extremity.  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 Zigmund Daniel, laser therapy OD   Seizures ([place])    pt reports this is from low blood sugar    Past Surgical History:  Procedure Laterality Date   COLONOSCOPY     DIRECT LARYNGOSCOPY N/A 02/10/2020   Procedure: DIRECT LARYNGOSCOPY WITH BIOPSY;  Surgeon: [name], MD;  Location: [place];  Service: ENT;  Laterality: N/A;   Ear surgery as a child     IR GASTROSTOMY TUBE MOD SED  03/23/2020   IR GASTROSTOMY TUBE REMOVAL  06/01/2020   IR IMAGING GUIDED PORT INSERTION  03/09/2020   IR REMOVAL TUN ACCESS W/ PORT W/O FL MOD SED  02/08/2021   TONSILLECTOMY     Social History   Socioeconomic History   Marital status: Divorced    Spouse name: Not on file   Number of children: 2   Years of education: 12   Highest education level: 12th grade  Occupational History   Occupation: Disabled  Tobacco Use   Smoking status: Never    Passive exposure: Never   Smokeless tobacco: Never   Tobacco comments:    Verified by son, Savvas Shamberger  Vaping Use   Vaping Use: Never used  Substance and Sexual Activity   Alcohol use: Yes    Alcohol/week: 0.0 standard drinks of alcohol    Comment: seldom   Drug use:  Yes    Frequency: 7.0 times per week    Comment: marijauna for arthritis, last 03-13-20   Sexual activity: Not Currently    Partners: Female  Other Topics Concern   Not on file  Social History Narrative   Two children.  5 grandchildren.     Social Determinants of Health   Financial Resource Strain: Low Risk  (04/05/2022)   Overall Financial Resource Strain (CARDIA)    Difficulty of Paying Living Expenses: Not hard at all  Food Insecurity: No Food Insecurity (04/05/2022)   Hunger Vital Sign    Worried About Running Out of Food in the Last Year: Never true    Ran Out of Food in the Last Year: Never true  Transportation Needs: No Transportation Needs (04/05/2022)   PRAPARE - Hydrologist (Medical): No    Lack of Transportation (Non-Medical): No  Physical Activity: Inactive (04/05/2022)   Exercise Vital Sign    Days of Exercise per Week: 0 days     Minutes of Exercise per Session: 0 min  Stress: No Stress Concern Present (04/05/2022)   [place]    Feeling of Stress : Not at all  Social Connections: Socially Isolated (04/05/2022)   Social Connection and Isolation Panel [NHANES]    Frequency of Communication with Friends and Family: Twice a week    Frequency of Social Gatherings with Friends and Family: Never    Attends Religious Services: Never    Marine scientist or Organizations: No    Attends Archivist Meetings: Never    Marital Status: Divorced  Human resources officer Violence: Not At Risk (04/05/2022)   Humiliation, Afraid, Rape, and Kick questionnaire    Fear of Current or Ex-Partner: No    Emotionally Abused: No    Physically Abused: No    Sexually Abused: No   Current Outpatient Medications on File Prior to Visit  Medication Sig Dispense Refill   apixaban (ELIQUIS) 5 MG TABS tablet Take 1 tablet (5 mg total) by mouth 2 (two) times daily. 60 tablet 5   atorvastatin (LIPITOR) 20 MG tablet TAKE 1 TABLET BY MOUTH EVERY DAY 90 tablet 3   Blood Glucose Monitoring Suppl (ONETOUCH VERIO IQ SYSTEM) w/Device KIT 1 each by Does not apply route 2 (two) times daily. 1 kit 0   Continuous Blood Gluc Sensor (FREESTYLE LIBRE 2 SENSOR) MISC Use as directed to monitor blood glucose continuously. Change sensor Q 14 days. DX E11.65 2 each 11   diltiazem (CARDIZEM CD) 120 MG 24 hr capsule TAKE 2 CAPSULES BY MOUTH DAILY. 180 capsule 1   diltiazem (CARDIZEM CD) 240 MG 24 hr capsule TAKE 1 CAPSULE BY MOUTH EVERY DAY 30 capsule 0   doxazosin (CARDURA) 2 MG tablet Take 1 tablet (2 mg total) by mouth daily. 30 tablet 3   ferrous sulfate 325 (65 FE) MG EC tablet Take 325 mg by mouth 3 (three) times daily with meals.     furosemide (LASIX) 40 MG tablet TAKE 1 TABLET (40 MG TOTAL) BY MOUTH AS NEEDED FOR FLUID OR EDEMA. 90 tablet 3   glucose blood test strip 1 each by Other route  as needed for other. Use as instructed 100 each 11   hydrALAZINE (APRESOLINE) 50 MG tablet TAKE 1 TABLET BY MOUTH THREE TIMES A DAY 270 tablet 1   losartan (COZAAR) 100 MG tablet TAKE 1 TABLET BY MOUTH EVERY DAY 90 tablet 3  pioglitazone (ACTOS) 30 MG tablet Take 1 tablet (30 mg total) by mouth daily. 90 tablet 0   carvedilol (COREG) 25 MG tablet Take 1 tablet (25 mg total) by mouth 2 (two) times daily. 60 tablet 2   No current facility-administered medications on file prior to visit.   Allergies  Allergen Reactions   Codeine Other (See Comments)    Unknown reaction, severe headach   Metformin And Related Diarrhea     Review of Systems  All other systems reviewed and are negative.      Objective:   Physical Exam Vitals reviewed.  Constitutional:      Appearance: Normal appearance. He is normal weight.  Cardiovascular:     Rate and Rhythm: Normal rate. Rhythm irregular.     Heart sounds: Normal heart sounds.  Pulmonary:     Effort: Pulmonary effort is normal.     Breath sounds: Normal breath sounds. No rales.  Neurological:     Mental Status: He is alert.   Large swollen mass in his left cheek and left upper neck consistent with his malignancy        Assessment & Plan:  Stage 3b chronic kidney disease ([place])  Type 2 diabetes mellitus with obesity ([place]) - Plan: Hemoglobin A1c, CBC with Differential/Platelet, Lipid panel, COMPLETE METABOLIC PANEL WITH GFR  Dupuytren contracture  Refer the patient to a hand surgeon regarding Dupuytren's contracture.  No evidence of a stroke on today's exam.  I believe this is most likely due to Dupuytren's contracture.  Meanwhile check an A1c to monitor management of his diabetes.  Check a CBC a CMP and a lipid panel to monitor his cholesterol as well as his chronic kidney disease.  Blood pressure today acceptable.

## 2022-04-18 LAB — COMPLETE METABOLIC PANEL WITH GFR
AG Ratio: 2 (calc) (ref 1.0–2.5)
ALT: 21 U/L (ref 9–46)
AST: 17 U/L (ref 10–35)
Albumin: 3.8 g/dL (ref 3.6–5.1)
Alkaline phosphatase (APISO): 73 U/L (ref 35–144)
BUN/Creatinine Ratio: 16 (calc) (ref 6–22)
BUN: 33 mg/dL — ABNORMAL HIGH (ref 7–25)
CO2: 21 mmol/L (ref 20–32)
Calcium: 8.6 mg/dL (ref 8.6–10.3)
Chloride: 110 mmol/L (ref 98–110)
Creat: 2.07 mg/dL — ABNORMAL HIGH (ref 0.70–1.35)
Globulin: 1.9 g/dL (calc) (ref 1.9–3.7)
Glucose, Bld: 164 mg/dL — ABNORMAL HIGH (ref 65–99)
Potassium: 4.6 mmol/L (ref 3.5–5.3)
Sodium: 139 mmol/L (ref 135–146)
Total Bilirubin: 0.5 mg/dL (ref 0.2–1.2)
Total Protein: 5.7 g/dL — ABNORMAL LOW (ref 6.1–8.1)
eGFR: 34 mL/min/{1.73_m2} — ABNORMAL LOW (ref >=60)

## 2022-04-18 LAB — CBC WITH DIFFERENTIAL/PLATELET
Absolute Monocytes: 265 cells/uL (ref 200–950)
Basophils Absolute: 0 cells/uL (ref 0–200)
Basophils Relative: 0 %
Eosinophils Absolute: 10 cells/uL — ABNORMAL LOW (ref 15–500)
Eosinophils Relative: 0.4 %
HCT: 28.7 % — ABNORMAL LOW (ref 38.5–50.0)
Hemoglobin: 9.4 g/dL — ABNORMAL LOW (ref 13.2–17.1)
Lymphs Abs: 255 cells/uL — ABNORMAL LOW (ref 850–3900)
MCH: 30.1 pg (ref 27.0–33.0)
MCHC: 32.8 g/dL (ref 32.0–36.0)
MCV: 92 fL (ref 80.0–100.0)
MPV: 9.7 fL (ref 7.5–12.5)
Monocytes Relative: 10.6 %
Neutro Abs: 1970 cells/uL (ref 1500–7800)
Neutrophils Relative %: 78.8 %
Platelets: 142 10*3/uL (ref 140–400)
RBC: 3.12 10*6/uL — ABNORMAL LOW (ref 4.20–5.80)
RDW: 13.6 % (ref 11.0–15.0)
Total Lymphocyte: 10.2 %
WBC: 2.5 10*3/uL — ABNORMAL LOW (ref 3.8–10.8)

## 2022-04-18 LAB — LIPID PANEL
Cholesterol: 102 mg/dL (ref <200)
HDL: 32 mg/dL — ABNORMAL LOW (ref >=40)
LDL Cholesterol (Calc): 53 mg/dL (calc)
Non-HDL Cholesterol (Calc): 70 mg/dL (calc) (ref <130)
Total CHOL/HDL Ratio: 3.2 (calc) (ref <5.0)
Triglycerides: 88 mg/dL (ref <150)

## 2022-04-18 LAB — HEMOGLOBIN A1C
Hgb A1c MFr Bld: 6.5 % of total Hgb — ABNORMAL HIGH (ref <5.7)
Mean Plasma Glucose: 140 mg/dL
eAG (mmol/L): 7.7 mmol/L

## 2022-04-19 ENCOUNTER — Ambulatory Visit: Payer: Medicare HMO | Attending: Radiation Oncology | Admitting: Occupational Therapy

## 2022-05-08 ENCOUNTER — Telehealth: Payer: Self-pay | Admitting: Pharmacist

## 2022-05-08 NOTE — Progress Notes (Signed)
Care Management & Coordination Services Pharmacy Team   Reason for Encounter: Hypertension   Contacted patient to discuss hypertension disease state. Unsuccessful outreach. Left voicemail for patient to return call.    Current antihypertensive regimen:  losartan (COZAAR) 100 MG tablet  furosemide (LASIX) 40 MG tablet  doxazosin (CARDURA) 2 MG tablet  diltiazem (CARDIZEM CD) 240 MG 24 hr capsule  diltiazem (CARDIZEM CD) 120 MG 24 hr capsule  carvedilol (COREG) 25 MG tablet   Patient verbally confirms he is taking the above medications as directed.   How often are you checking your Blood Pressure?   he checks his blood pressure   taking his medication.  Current home BP readings:   DATE:             BP               PULSE   Wrist or arm cuff: Caffeine intake: Salt intake: OTC medications including pseudoephedrine or NSAIDs?  Any readings above 180/100?  If yes any symptoms of hypertensive emergency?   What recent interventions/DTPs have been made by any provider to improve Blood Pressure control since last CPP Visit:  Patient reported  Any recent hospitalizations or ED visits since last visit with CPP? No  What diet changes have been made to improve Blood Pressure Control?  Patient reported  What exercise is being done to improve your Blood Pressure Control?  Patient reported  Adherence Review: Is the patient currently on ACE/ARB medication? Yes Does the patient have >5 day gap between last estimated fill dates? No  Star Rating Drugs:  Medication:   Last Fill: Day Supply  Losartan 100 MG tablet  02/26/22  90 Atorvastatin 20 MG tablet  04/09/22 90  Chart Updates: Recent office visits:  04/17/22 Jenna Luo, MD - Family Medicine - Labs were ordered. Referral to Hand surgery. Follow up as scheduled.    Recent consult visits:  04/16/22 Minus Breeding, MD - Cardiology - Afib - EKG performed. No medication changes. Follow up in 6 months.   03/20/22 [name], MD - Oncology - No medication changes. Oncology treatment administered. Follow up as scheduled.    Hospital visits:  None in previous 6 months  Medications: Outpatient Encounter Medications as of 05/08/2022  Medication Sig Note   apixaban (ELIQUIS) 5 MG TABS tablet Take 1 tablet (5 mg total) by mouth 2 (two) times daily.    atorvastatin (LIPITOR) 20 MG tablet TAKE 1 TABLET BY MOUTH EVERY DAY    Blood Glucose Monitoring Suppl (ONETOUCH VERIO IQ SYSTEM) w/Device KIT 1 each by Does not apply route 2 (two) times daily.    carvedilol (COREG) 25 MG tablet Take 1 tablet (25 mg total) by mouth 2 (two) times daily. 04/05/2022: 1 1/2 tablets daily     Continuous Blood Gluc Sensor (FREESTYLE LIBRE 2 SENSOR) MISC Use as directed to monitor blood glucose continuously. Change sensor Q 14 days. DX E11.65    diltiazem (CARDIZEM CD) 120 MG 24 hr capsule TAKE 2 CAPSULES BY MOUTH DAILY.    diltiazem (CARDIZEM CD) 240 MG 24 hr capsule TAKE 1 CAPSULE BY MOUTH EVERY DAY 04/17/2022: Pt is taking 2 120's until they run out.    doxazosin (CARDURA) 2 MG tablet Take 1 tablet (2 mg total) by mouth daily.    ferrous sulfate 325 (65 FE) MG EC tablet Take 325 mg by mouth 3 (three) times daily with meals.    furosemide (LASIX) 40 MG tablet TAKE 1 TABLET (40  MG TOTAL) BY MOUTH AS NEEDED FOR FLUID OR EDEMA.    glucose blood test strip 1 each by Other route as needed for other. Use as instructed    hydrALAZINE (APRESOLINE) 50 MG tablet TAKE 1 TABLET BY MOUTH THREE TIMES A DAY    losartan (COZAAR) 100 MG tablet TAKE 1 TABLET BY MOUTH EVERY DAY    pioglitazone (ACTOS) 30 MG tablet Take 1 tablet (30 mg total) by mouth daily.    No facility-administered encounter medications on file as of 05/08/2022.    Recent Office Vitals: BP Readings from Last 3 Encounters:  04/17/22 120/62  04/16/22 120/72  04/05/22 130/68   Pulse Readings from Last 3 Encounters:  04/17/22 (!) 50  04/16/22 61  03/20/22 (!) 146    Wt Readings  from Last 3 Encounters:  04/17/22 179 lb 6.4 oz (81.4 kg)  04/16/22 178 lb 6.4 oz (80.9 kg)  04/05/22 164 lb (74.4 kg)     Kidney Function Lab Results  Component Value Date/Time   CREATININE 2.07 (H) 04/17/2022 11:44 AM   CREATININE 1.87 (H) 11/09/2021 07:26 AM   CREATININE 1.55 (H) 11/08/2021 06:45 AM   CREATININE 2.19 (H) 06/01/2021 11:33 AM   GFR 57.49 (L) 02/28/2010 12:27 PM   GFRNONAA 39 (L) 11/09/2021 07:26 AM   GFRNONAA 34 (L) 03/20/2021 09:09 AM   GFRNONAA 61 11/30/2019 09:15 AM   GFRAA 70 11/30/2019 09:15 AM       Latest Ref Rng & Units 04/17/2022   11:44 AM 11/09/2021    7:26 AM 11/08/2021    6:45 AM  BMP  Glucose 65 - 99 mg/dL 164  183  139   BUN 7 - 25 mg/dL 33  22  20   Creatinine 0.70 - 1.35 mg/dL 2.07  1.87  1.55   BUN/Creat Ratio 6 - 22 (calc) 16     Sodium 135 - 146 mmol/L 139  133  133   Potassium 3.5 - 5.3 mmol/L 4.6  4.3  4.2   Chloride 98 - 110 mmol/L 110  95  96   CO2 20 - 32 mmol/L 21  26  27    Calcium 8.6 - 10.3 mg/dL 8.6  8.7  8.5      Future Appointments  Date Time Provider [place]  07/24/2022  3:00 PM [name], MD [facility] None  09/27/2022  9:45 AM [name], MD [facility] None  10/16/2022 11:15 AM [name], MD [facility]  10/18/2022 11:00 AM Minus Breeding, MD [facility] None  04/11/2023  9:00 AM [facility]-ANNUAL WELLNESS VISIT [facility] [place] [place]

## 2022-05-28 ENCOUNTER — Other Ambulatory Visit: Payer: Self-pay

## 2022-05-28 MED ORDER — ATORVASTATIN CALCIUM 20 MG PO TABS: 20.0000 mg | ORAL_TABLET | Freq: Every day | ORAL | 3 refills | Status: DC

## 2022-05-28 MED ORDER — DILTIAZEM HCL ER COATED BEADS 240 MG PO CP24: 240.0000 mg | ORAL_CAPSULE | Freq: Every day | ORAL | 1 refills | Status: DC

## 2022-05-28 NOTE — Telephone Encounter (Signed)
Prescription Request  05/28/2022  LOV: 05/15/32  What is the name of the medication or equipment? atorvastatin (LIPITOR) 20 MG tablet  Have you contacted your pharmacy to request a refill? Yes   Which pharmacy would you like this sent to?  [id number] [place], [place] - [address] [place] [address] Phone: [phone] Fax: [phone]    Patient notified that their request is being sent to the clinical staff for review and that they should receive a response within 2 business days.   Please advise at [facility] [phone]

## 2022-05-28 NOTE — Telephone Encounter (Signed)
Requested Prescriptions  Pending Prescriptions Disp Refills   diltiazem (CARDIZEM CD) 240 MG 24 hr capsule 90 capsule 1    Sig: Take 1 capsule (240 mg total) by mouth daily.     Cardiovascular: Calcium Channel Blockers 3 Failed - 05/28/2022 11:29 AM      Failed - Cr in normal range and within 360 days    Creat  Date Value Ref Range Status  04/17/2022 2.07 (H) 0.70 - 1.35 mg/dL Final   Creatinine, Urine  Date Value Ref Range Status  12/12/2009 226.8 mg/dL Final         Failed - Valid encounter within last 6 months    Recent Outpatient Visits           10 months ago Atrial fibrillation, unspecified type (HCC)   [facility] Pickard, Priscille Heidelberg, MD   12 months ago Atrial fibrillation, unspecified type (HCC)   [facility], Priscille Heidelberg, MD   1 year ago Atrial fibrillation, unspecified type (HCC)   [facility] Pickard, Priscille Heidelberg, MD   1 year ago Atrial fibrillation, unspecified type (HCC)   [facility], Priscille Heidelberg, MD   1 year ago Atrial fibrillation, unspecified type (HCC)   [facility]ard, Priscille Heidelberg, MD       Future Appointments             In 4 months Rollene Rotunda, MD [facility] at [facility] - ALT in normal range and within 360 days    ALT  Date Value Ref Range Status  04/17/2022 21 9 - 46 U/L Final  03/20/2021 13 0 - 44 U/L Final         Passed - AST in normal range and within 360 days    AST  Date Value Ref Range Status  04/17/2022 17 10 - 35 U/L Final  03/20/2021 14 (L) 15 - 41 U/L Final         Passed - Last BP in normal range    BP Readings from Last 1 Encounters:  04/17/22 120/62         Passed - Last Heart Rate in normal range    Pulse Readings from Last 1 Encounters:  04/17/22 (!) 50          atorvastatin (LIPITOR) 20 MG tablet 90 tablet 3    Sig: Take 1 tablet (20 mg total) by mouth daily.     Cardiovascular:   Antilipid - Statins Failed - 05/28/2022 11:29 AM      Failed - Lipid Panel in normal range within the last 12 months    Cholesterol  Date Value Ref Range Status  04/17/2022 102 <200 mg/dL Final   LDL Cholesterol (Calc)  Date Value Ref Range Status  04/17/2022 53 mg/dL (calc) Final    Comment:    Reference range: <100 . Desirable range <100 mg/dL for primary prevention;   <70 mg/dL for patients with CHD or diabetic patients  with > or = 2 CHD risk factors. Marland Kitchen LDL-C is now calculated using the Martin-Hopkins  calculation, which is a validated novel method providing  better accuracy than the Friedewald equation in the  estimation of LDL-C.  Horald Pollen et al. Lenox Ahr. 8127;517(00): 2061-2068  (http://education.[employer].com/faq/FAQ164)    HDL  Date Value Ref Range Status  04/17/2022 32 (L) > OR = 40 mg/dL Final  Triglycerides  Date Value Ref Range Status  04/17/2022 88 <150 mg/dL Final         Passed - Patient is not pregnant      Passed - Valid encounter within last 12 months    Recent Outpatient Visits           10 months ago Atrial fibrillation, unspecified type (HCC)   [facility] Pickard, Priscille Heidelberg, MD   12 months ago Atrial fibrillation, unspecified type [facility])   [facility]ard, Priscille Heidelberg, MD   1 year ago Atrial fibrillation, unspecified type [facility])   [facility] Pickard, Priscille Heidelberg, MD   1 year ago Atrial fibrillation, unspecified type [facility])   [facility]ard, Priscille Heidelberg, MD   1 year ago Atrial fibrillation, unspecified type [facility])   [facility] Pickard, Priscille Heidelberg, MD       Future Appointments             In 4 months Rollene Rotunda, MD [facility] at [facility]

## 2022-05-28 NOTE — Telephone Encounter (Signed)
Prescription Request  05/28/2022   LOV: 04/17/22  What is the name of the medication or equipment? diltiazem (CARDIZEM CD) 240 MG 24 hr capsule  Have you contacted your pharmacy to request a refill? Yes   Which pharmacy would you like this sent to?  [id number] [place], [place] - [address] [place] [address] Phone: [phone] Fax: [phone]    Patient notified that their request is being sent to the clinical staff for review and that they should receive a response within 2 business days.   Please advise at [facility] [phone]

## 2022-07-09 ENCOUNTER — Other Ambulatory Visit: Payer: Self-pay

## 2022-07-09 NOTE — Telephone Encounter (Signed)
Prescription Request  07/09/2022  LOV: 04/17/22  What is the name of the medication or equipment? pioglitazone (ACTOS) 30 MG tablet [[id number]]  Have you contacted your pharmacy to request a refill? Yes   Which pharmacy would you like this sent to?  [id number] [place], [place] - [address] [place] [address] Phone: [phone] Fax: [phone]    Patient notified that their request is being sent to the clinical staff for review and that they should receive a response within 2 business days.   Please advise at [facility] [phone]

## 2022-07-10 MED ORDER — PIOGLITAZONE HCL 30 MG PO TABS: 30.0000 mg | ORAL_TABLET | Freq: Every day | ORAL | 0 refills | Status: DC

## 2022-07-10 NOTE — Telephone Encounter (Signed)
Requested Prescriptions  Pending Prescriptions Disp Refills   pioglitazone (ACTOS) 30 MG tablet 90 tablet 0    Sig: Take 1 tablet (30 mg total) by mouth daily.     Endocrinology:  Diabetes - Glitazones - pioglitazone Failed - 07/09/2022 12:28 PM      Failed - Valid encounter within last 6 months    Recent Outpatient Visits           12 months ago Atrial fibrillation, unspecified type (HCC)   [facility] Pickard, Priscille Heidelberg, MD   1 year ago Atrial fibrillation, unspecified type [facility])   [facility] [name], MD   1 year ago Atrial fibrillation, unspecified type [facility])   [facility] [name], MD   1 year ago Atrial fibrillation, unspecified type [facility])   [facility] [name], MD   1 year ago Atrial fibrillation, unspecified type (HCC)   [facility] Pickard, Priscille Heidelberg, MD       Future Appointments             In 3 months Rollene Rotunda, MD [facility] - HBA1C is between 0 and 7.9 and within 180 days    Hgb A1c MFr Bld  Date Value Ref Range Status  04/17/2022 6.5 (H) <5.7 % of total Hgb Final    Comment:    For someone without known diabetes, a hemoglobin A1c value of 6.5% or greater indicates that they may have  diabetes and this should be confirmed with a follow-up  test. . For someone with known diabetes, a value <7% indicates  that their diabetes is well controlled and a value  greater than or equal to 7% indicates suboptimal  control. A1c targets should be individualized based on  duration of diabetes, age, comorbid conditions, and  other considerations. . Currently, no consensus exists regarding use of hemoglobin A1c for diagnosis of diabetes for children. Marland Kitchen

## 2022-07-19 ENCOUNTER — Telehealth: Payer: Self-pay | Admitting: *Deleted

## 2022-07-19 NOTE — Telephone Encounter (Signed)
CALLED PATIENT TO INFORM OF CT FOR 07-25-22- ARRIVAL TIME- 12:15 PM @ [facility], NO RESTRICTIONS TO TEST, PATIENT TO RECEIVE RESULTS FROM DR. SQUIRE ON  07-27-22 @ 10:20 AM, SPOKE WITH PATIENT AND HE IS AWARE OF THESE APPTS. AND THE INSTRUCTIONS

## 2022-07-23 DIAGNOSIS — Z0100 Encounter for examination of eyes and vision without abnormal findings: Secondary | ICD-10-CM | POA: Diagnosis not present

## 2022-07-24 ENCOUNTER — Ambulatory Visit: Payer: Self-pay | Admitting: Radiation Oncology

## 2022-07-25 ENCOUNTER — Ambulatory Visit (HOSPITAL_COMMUNITY)
Admission: RE | Admit: 2022-07-25 | Discharge: 2022-07-25 | Disposition: A | Discharge status: Home / Self Care | Payer: Medicare HMO | Source: Ambulatory Visit | Attending: Radiation Oncology | Admitting: Radiation Oncology

## 2022-07-25 DIAGNOSIS — C01 Malignant neoplasm of base of tongue: Secondary | ICD-10-CM | POA: Diagnosis not present

## 2022-07-25 DIAGNOSIS — C760 Malignant neoplasm of head, face and neck: Secondary | ICD-10-CM | POA: Diagnosis not present

## 2022-07-25 DIAGNOSIS — R918 Other nonspecific abnormal finding of lung field: Secondary | ICD-10-CM | POA: Diagnosis not present

## 2022-07-27 ENCOUNTER — Ambulatory Visit
Admission: RE | Admit: 2022-07-27 | Discharge: 2022-07-27 | Disposition: A | Discharge status: Home / Self Care | Payer: Medicare HMO | Source: Ambulatory Visit | Attending: Radiation Oncology | Admitting: Radiation Oncology

## 2022-07-27 ENCOUNTER — Telehealth: Payer: Self-pay

## 2022-07-27 ENCOUNTER — Telehealth: Payer: Self-pay | Admitting: *Deleted

## 2022-07-27 NOTE — Telephone Encounter (Signed)
CALLED PATIENT TO INFORM OF FU APPT. WITH DR. [name] ON 10-26-22 @ 2 PM, LVM FOR A RETURN CALL

## 2022-07-27 NOTE — Telephone Encounter (Signed)
Called patient multiple times throughout the day to relay CT scan results per Dr. [name] and inform him of new appointment with pulmonologist (Dr. [name]) on Monday 6/10. Patient did not answer and was not able to leave VM on cell phone. Left message requesting return call on patient's home number (which is also listed as patient's son Aaron's number).   Will ask another nurse to follow up with patient Monday morning

## 2022-07-30 ENCOUNTER — Telehealth: Payer: Self-pay

## 2022-07-30 ENCOUNTER — Telehealth: Payer: Self-pay | Admitting: Pulmonary Disease

## 2022-07-30 ENCOUNTER — Telehealth: Payer: Medicare HMO | Admitting: Pulmonary Disease

## 2022-07-30 NOTE — Telephone Encounter (Signed)
Rn received call from pt son after our staff had attempted to obtain contact about his fathers missed appointment. Rn was able to update son on findings of CT scan of chest, and pt need for follow up based on these results. Rn also informed pt son that the pt was not open to conversation about follow up at this time. Pt stated on the phone to RN he was "over it" this am and did not want to deal with it. Pt son Brian [name] stated if he had to get involved he would to get his dad the follow up that the pt needs. Rn is contacting [name] office to see what options we have for consult with Brian [name], to start process of getting Bronchoscopy scheduled. Rn will keep pt and family updated.

## 2022-07-30 NOTE — Telephone Encounter (Signed)
Spoke with patients son. He is requesting today's appointment be changed to a televist. He states patient doesn't have access to mychart.   Dr. [name] please advise if this is okay? I know we dont do these but they wanted me to ask anyway

## 2022-07-30 NOTE — Telephone Encounter (Signed)
Called both numbers listed for pt and left VM on both. Will try again later.

## 2022-07-30 NOTE — Telephone Encounter (Signed)
Rn called pt after he missed his Friday morning follow up visit and our staff was able to contact him after several attempts. Pt stated with phone call this am he thought that his appointment was in the afternoon so he missed it. Rn informed pt of concerning CT results and that Dr. [name] wanted him to follow up with Dr. Myrlene Broker office to start process of getting a bronchoscopy scheduled for later this month. Our staff had made pt a virtual visit and the pulmonology office had called him. He got frustrated because he could not do a virtual visit. He did not know how and did not want to "deal with it" this am. Rn encouraged pt he does need follow up soon for this concern. He stated he would talk about it later this week with RN. Rn and pt arranged for RN to call him on Thursday at 12 noon. He said he would feel like talking then. He stated right now he was "over it".

## 2022-07-30 NOTE — Telephone Encounter (Signed)
Called and spoke with pt's son. Appt has been changed to 6/18 with Dr. [name]. Pt's son verbalized understanding and denied any further questions at this time.

## 2022-07-30 NOTE — Telephone Encounter (Signed)
Victorino Dike states patient needs telephone visit. Not able to do mychart visit today. Can do telephone visit today. Victorino Dike phone number is [phone].

## 2022-07-30 NOTE — Telephone Encounter (Signed)
Rn called [name] office to seek assistance options for pt to see Dr. [name] for CT Scan of chest concerns. Pt is unable to a virtual visit (he does not know how and becomes frustrated talking about it). He also states he cannot get the office as well. Pt son stated a telephone visit might be the best option. Rn left message with receptionist to speak to RN about the best way for pt to start the process of seeing Dr. [name], with follow up planning for Bronchoscopy later this month. Rn awaiting call back from office.

## 2022-08-02 ENCOUNTER — Ambulatory Visit: Payer: Medicare HMO | Admitting: Radiation Oncology

## 2022-08-02 ENCOUNTER — Telehealth: Payer: Self-pay

## 2022-08-02 NOTE — Telephone Encounter (Signed)
Rn attempted to reach pt son to ensure all arrangements were made for his dad to follow up with pulmonary Md on 08-07-22. Voicemail was left with call back details. Rn awaiting call back from family.

## 2022-08-07 ENCOUNTER — Encounter: Payer: Self-pay | Admitting: Pulmonary Disease

## 2022-08-07 ENCOUNTER — Ambulatory Visit (INDEPENDENT_AMBULATORY_CARE_PROVIDER_SITE_OTHER): Payer: Medicare HMO | Admitting: Pulmonary Disease

## 2022-08-07 VITALS — BP 140/80 | HR 145 | Ht 69.0 in | Wt 160.2 lb

## 2022-08-07 DIAGNOSIS — Z8589 Personal history of malignant neoplasm of other organs and systems: Secondary | ICD-10-CM

## 2022-08-07 DIAGNOSIS — C3432 Malignant neoplasm of lower lobe, left bronchus or lung: Secondary | ICD-10-CM | POA: Diagnosis not present

## 2022-08-07 NOTE — Patient Instructions (Signed)
Thank you for visiting Dr. [name] at [facility]. Today we recommend the following:  Orders Placed This Encounter  Procedures   NM PET Image Initial (PI) Skull Base To Thigh (F-18 FDG)    Return in about 4 weeks (around 09/04/2022) for with Kandice Robinsons, NP, after PET/CT Chest.    Please do your part to reduce the spread of COVID-19.

## 2022-08-07 NOTE — Progress Notes (Signed)
Synopsis: Referred in June 2024 for abnormal CT chest by [name], MD  Subjective:   PATIENT ID: Brian [name] GENDER: male DOB: 09/28/1955, MRN: [id number]  Chief Complaint  Patient presents with   Consult    Consult for lung nodule.    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 [name].  Follow-up CT imaging has revealed adenopathy within the chest as well as a lesion in the left hilum.  Patient was referred here for consideration of bronchoscopy and biopsy.  He is very upset today.  He is dealing a lot with things at home.  He is currently caring for his 11 year old mother.  He is very distraught about this information and is not sure what to do.  I encouraged him for consideration of bronchoscopy and biopsy we can come up with a better plan to move forward he has declined intervention at this time.  He would like to have a PET scan complete.  He states that if he does find out that he has "cancer everywhere" he does not want to do anything with it and would not want to undergo additional treatments at this time.      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 [name], laser therapy OD   Seizures (HCC)    pt reports this is from low blood sugar     Family History  Problem Relation Age of Onset   Colon cancer Mother    Hyperlipidemia Father    Hypertension Father    Breast cancer Sister    Coronary artery disease Neg Hx        no early CAD.   Stomach cancer Neg Hx    Rectal cancer Neg Hx    Esophageal cancer  Neg Hx      Past Surgical History:  Procedure Laterality Date   COLONOSCOPY     DIRECT LARYNGOSCOPY N/A 02/10/2020   Procedure: DIRECT LARYNGOSCOPY WITH BIOPSY;  Surgeon: Newman Pies, MD;  Location: [facility] OR;  Service: ENT;  Laterality: N/A;   Ear surgery as a child     IR GASTROSTOMY TUBE MOD SED  03/23/2020   IR GASTROSTOMY TUBE REMOVAL  06/01/2020   IR IMAGING GUIDED PORT INSERTION  03/09/2020   IR REMOVAL TUN ACCESS W/ PORT W/O FL MOD SED  02/08/2021   TONSILLECTOMY      Social History   Socioeconomic History   Marital status: Divorced    Spouse name: Not on file   Number of children: 2   Years of education: 12   Highest education level: 12th grade  Occupational History   Occupation: Disabled  Tobacco Use   Smoking status: Never    Passive exposure: Never   Smokeless tobacco: Never   Tobacco comments:    Verified by son, Raylon Thagard  Vaping Use   Vaping Use: Never used  Substance and Sexual Activity   Alcohol use: Yes    Alcohol/week: 0.0 standard drinks of alcohol  Comment: seldom   Drug use: Yes    Frequency: 7.0 times per week    Comment: marijauna for arthritis, last 03-13-20   Sexual activity: Not Currently    Partners: Female  Other Topics Concern   Not on file  Social History Narrative   Two children.  5 grandchildren.     Social Determinants of Health   Financial Resource Strain: Low Risk  (04/05/2022)   Overall Financial Resource Strain (CARDIA)    Difficulty of Paying Living Expenses: Not hard at all  Food Insecurity: No Food Insecurity (04/05/2022)   Hunger Vital Sign    Worried About Running Out of Food in the Last Year: Never true    Ran Out of Food in the Last Year: Never true  Transportation Needs: No Transportation Needs (04/05/2022)   PRAPARE - Administrator, Civil Service (Medical): No    Lack of Transportation (Non-Medical): No  Physical Activity: Inactive (04/05/2022)   Exercise Vital Sign    Days of Exercise per Week: 0  days    Minutes of Exercise per Session: 0 min  Stress: No Stress Concern Present (04/05/2022)   [employer] of Occupational Health - Occupational Stress Questionnaire    Feeling of Stress : Not at all  Social Connections: Socially Isolated (04/05/2022)   Social Connection and Isolation Panel [NHANES]    Frequency of Communication with Friends and Family: Twice a week    Frequency of Social Gatherings with Friends and Family: Never    Attends Religious Services: Never    Database administrator or Organizations: No    Attends Banker Meetings: Never    Marital Status: Divorced  Catering manager Violence: Not At Risk (04/05/2022)   Humiliation, Afraid, Rape, and Kick questionnaire    Fear of Current or Ex-Partner: No    Emotionally Abused: No    Physically Abused: No    Sexually Abused: No     Allergies  Allergen Reactions   Codeine Other (See Comments)    Unknown reaction, severe headach   Metformin And Related Diarrhea     Outpatient Medications Prior to Visit  Medication Sig Dispense Refill   apixaban (ELIQUIS) 5 MG TABS tablet Take 1 tablet (5 mg total) by mouth 2 (two) times daily. 60 tablet 5   atorvastatin (LIPITOR) 20 MG tablet Take 1 tablet (20 mg total) by mouth daily. 90 tablet 3   Blood Glucose Monitoring Suppl (ONETOUCH VERIO IQ SYSTEM) w/Device KIT 1 each by Does not apply route 2 (two) times daily. 1 kit 0   Continuous Blood Gluc Sensor (FREESTYLE LIBRE 2 SENSOR) MISC Use as directed to monitor blood glucose continuously. Change sensor Q 14 days. DX E11.65 2 each 11   diltiazem (CARDIZEM CD) 120 MG 24 hr capsule TAKE 2 CAPSULES BY MOUTH DAILY. 180 capsule 1   diltiazem (CARDIZEM CD) 240 MG 24 hr capsule Take 1 capsule (240 mg total) by mouth daily. 90 capsule 1   doxazosin (CARDURA) 2 MG tablet Take 1 tablet (2 mg total) by mouth daily. 30 tablet 3   ferrous sulfate 325 (65 FE) MG EC tablet Take 325 mg by mouth 3 (three) times daily with meals.      furosemide (LASIX) 40 MG tablet TAKE 1 TABLET (40 MG TOTAL) BY MOUTH AS NEEDED FOR FLUID OR EDEMA. 90 tablet 3   glucose blood test strip 1 each by Other route as needed for other. Use as instructed 100 each 11  hydrALAZINE (APRESOLINE) 50 MG tablet TAKE 1 TABLET BY MOUTH THREE TIMES A DAY 270 tablet 1   losartan (COZAAR) 100 MG tablet TAKE 1 TABLET BY MOUTH EVERY DAY 90 tablet 3   pioglitazone (ACTOS) 30 MG tablet Take 1 tablet (30 mg total) by mouth daily. 90 tablet 0   carvedilol (COREG) 25 MG tablet Take 1 tablet (25 mg total) by mouth 2 (two) times daily. 60 tablet 2   No facility-administered medications prior to visit.    Review of Systems  Constitutional:  Negative for chills, fever, malaise/fatigue and weight loss.  HENT:  Negative for hearing loss, sore throat and tinnitus.   Eyes:  Negative for blurred vision and double vision.  Respiratory:  Positive for cough and shortness of breath. Negative for hemoptysis, sputum production, wheezing and stridor.   Cardiovascular:  Negative for chest pain, palpitations, orthopnea, leg swelling and PND.  Gastrointestinal:  Negative for abdominal pain, constipation, diarrhea, heartburn, nausea and vomiting.  Genitourinary:  Negative for dysuria, hematuria and urgency.  Musculoskeletal:  Negative for joint pain and myalgias.  Skin:  Negative for itching and rash.  Neurological:  Negative for dizziness, tingling, weakness and headaches.  Endo/Heme/Allergies:  Negative for environmental allergies. Does not bruise/bleed easily.  Psychiatric/Behavioral:  Positive for depression. The patient is nervous/anxious. The patient does not have insomnia.   All other systems reviewed and are negative.    Objective:  Physical Exam Vitals reviewed.  Constitutional:      General: He is not in acute distress.    Appearance: He is well-developed.  HENT:     Head: Normocephalic and atraumatic.  Eyes:     General: No scleral icterus.     Conjunctiva/sclera: Conjunctivae normal.     Pupils: Pupils are equal, round, and reactive to light.  Neck:     Vascular: No JVD.     Trachea: No tracheal deviation.  Cardiovascular:     Rate and Rhythm: Normal rate and regular rhythm.     Heart sounds: Normal heart sounds. No murmur heard. Pulmonary:     Effort: Pulmonary effort is normal. No tachypnea, accessory muscle usage or respiratory distress.     Breath sounds: No stridor. No wheezing, rhonchi or rales.     Comments: Diminished breath sounds bilaterally Abdominal:     General: There is no distension.     Palpations: Abdomen is soft.     Tenderness: There is no abdominal tenderness.  Musculoskeletal:        General: No tenderness.     Cervical back: Neck supple.  Lymphadenopathy:     Cervical: No cervical adenopathy.  Skin:    General: Skin is warm and dry.     Capillary Refill: Capillary refill takes less than 2 seconds.     Findings: No rash.  Neurological:     Mental Status: He is alert and oriented to person, place, and time.  Psychiatric:        Behavior: Behavior normal.      Vitals:   08/07/22 1523  BP: (!) 140/80  Pulse: (!) 145  SpO2: 98%  Weight: 160 lb 3.2 oz (72.7 kg)  Height: 5\' 9"  (1.753 m)   98% on RA BMI Readings from Last 3 Encounters:  08/07/22 23.66 kg/m  04/17/22 26.49 kg/m  04/16/22 26.35 kg/m   Wt Readings from Last 3 Encounters:  08/07/22 160 lb 3.2 oz (72.7 kg)  04/17/22 179 lb 6.4 oz (81.4 kg)  04/16/22 178 lb 6.4 oz (80.9  kg)     CBC    Component Value Date/Time   WBC 2.5 (L) 04/17/2022 1144   RBC 3.12 (L) 04/17/2022 1144   HGB 9.4 (L) 04/17/2022 1144   HGB 11.9 (L) 09/16/2020 1540   HCT 28.7 (L) 04/17/2022 1144   PLT 142 04/17/2022 1144   PLT 161 09/16/2020 1540   MCV 92.0 04/17/2022 1144   MCH 30.1 04/17/2022 1144   MCHC 32.8 04/17/2022 1144   RDW 13.6 04/17/2022 1144   LYMPHSABS 255 (L) 04/17/2022 1144   MONOABS 0.3 11/09/2021 0726   EOSABS 10 (L) 04/17/2022  1144   BASOSABS 0 04/17/2022 1144     Chest Imaging:  June 2024 CT chest: Left infrahilar adenopathy, mediastinal adenopathy, near occlusion of the left lower lobe bronchus concerning for endobronchial lesion The patient's images have been independently reviewed by me.    Pulmonary Functions Testing Results:    Latest Ref Rng & Units 06/07/2021   12:54 PM  PFT Results  FVC-Pre L 3.95   FVC-Predicted Pre % 90   FVC-Post L 4.03   FVC-Predicted Post % 92   Pre FEV1/FVC % % 83   Post FEV1/FCV % % 83   FEV1-Pre L 3.27   FEV1-Predicted Pre % 100   FEV1-Post L 3.34   DLCO uncorrected ml/min/mmHg 15.35   DLCO UNC% % 59   DLCO corrected ml/min/mmHg 18.26   DLCO COR %Predicted % 71   DLVA Predicted % 75   TLC L 6.09   TLC % Predicted % 89   RV % Predicted % 73     FeNO:   Pathology:   Echocardiogram:   Heart Catheterization:     Assessment & Plan:     ICD-10-CM   1. 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 History of head and neck cancer  Z85.89 NM PET Image Initial (PI) Skull Base To Thigh (F-18 FDG)      Discussion:  This is a 67 year old gentleman history of tobacco abuse, history of head neck cancer now with abnormal CT of the chest which has left lung lesion possible left endobronchial cancer as well as adenopathy in the chest.  Overall CT imaging is concerning for recurrence of disease or new primary.  Plan: Today in the office we talked about the risk benefits and alternatives to consideration of bronchoscopy and biopsy. The patient is very distraught about this information. Seems like he is going through very trying time at home caring for his 35 year old mother. He is not interested in having a bronchoscopy at this time. He is okay with consideration of having a PET scan.  He is concerned that if he has malignancy or cancer that has spread everywhere that he would not want to  undergo additional treatments to include chemotherapy or radiation. I have stated that we would go ahead and move forward with the PET scan and give him time to process the information that was reviewed today in the office. Patient is agreeable to this plan. He is, see [name] back in clinic after the PET scan is complete to discuss consideration for bronchoscopy and biopsy at that point. Hopefully the patient will have an opportunity to have any additional questions answered.  If he does have evidence on the PET scan advanced age malignancy and declines treatment would consider referral to hospice care at that time.   Current Outpatient Medications:  apixaban (ELIQUIS) 5 MG TABS tablet, Take 1 tablet (5 mg total) by mouth 2 (two) times daily., Disp: 60 tablet, Rfl: 5   atorvastatin (LIPITOR) 20 MG tablet, Take 1 tablet (20 mg total) by mouth daily., Disp: 90 tablet, Rfl: 3   Blood Glucose Monitoring Suppl (ONETOUCH VERIO IQ SYSTEM) w/Device KIT, 1 each by Does not apply route 2 (two) times daily., Disp: 1 kit, Rfl: 0   Continuous Blood Gluc Sensor (FREESTYLE LIBRE 2 SENSOR) MISC, Use as directed to monitor blood glucose continuously. Change sensor Q 14 days. DX E11.65, Disp: 2 each, Rfl: 11   diltiazem (CARDIZEM CD) 120 MG 24 hr capsule, TAKE 2 CAPSULES BY MOUTH DAILY., Disp: 180 capsule, Rfl: 1   diltiazem (CARDIZEM CD) 240 MG 24 hr capsule, Take 1 capsule (240 mg total) by mouth daily., Disp: 90 capsule, Rfl: 1   doxazosin (CARDURA) 2 MG tablet, Take 1 tablet (2 mg total) by mouth daily., Disp: 30 tablet, Rfl: 3   ferrous sulfate 325 (65 FE) MG EC tablet, Take 325 mg by mouth 3 (three) times daily with meals., Disp: , Rfl:    furosemide (LASIX) 40 MG tablet, TAKE 1 TABLET (40 MG TOTAL) BY MOUTH AS NEEDED FOR FLUID OR EDEMA., Disp: 90 tablet, Rfl: 3   glucose blood test strip, 1 each by Other route as needed for other. Use as instructed, Disp: 100 each, Rfl: 11   hydrALAZINE (APRESOLINE) 50 MG  tablet, TAKE 1 TABLET BY MOUTH THREE TIMES A DAY, Disp: 270 tablet, Rfl: 1   losartan (COZAAR) 100 MG tablet, TAKE 1 TABLET BY MOUTH EVERY DAY, Disp: 90 tablet, Rfl: 3   pioglitazone (ACTOS) 30 MG tablet, Take 1 tablet (30 mg total) by mouth daily., Disp: 90 tablet, Rfl: 0   carvedilol (COREG) 25 MG tablet, Take 1 tablet (25 mg total) by mouth 2 (two) times daily., Disp: 60 tablet, Rfl: 2  I spent 62 minutes dedicated to the care of this patient on the date of this encounter to include pre-visit review of records, face-to-face time with the patient discussing conditions above, post visit ordering of testing, clinical documentation with the electronic health record, making appropriate referrals as documented, and communicating necessary findings to members of the patients care team.    Josephine Igo, DO [facility] 08/07/2022 4:50 PM

## 2022-08-16 ENCOUNTER — Telehealth: Payer: Self-pay | Admitting: Hematology and Oncology

## 2022-08-16 NOTE — Telephone Encounter (Signed)
Left message regarding appointment times/dates 

## 2022-08-24 ENCOUNTER — Encounter (HOSPITAL_COMMUNITY): Payer: Medicare HMO

## 2022-08-24 ENCOUNTER — Encounter (HOSPITAL_COMMUNITY): Payer: Self-pay

## 2022-09-04 DIAGNOSIS — E785 Hyperlipidemia, unspecified: Secondary | ICD-10-CM | POA: Diagnosis not present

## 2022-09-04 DIAGNOSIS — I1A0 Resistant hypertension: Secondary | ICD-10-CM | POA: Diagnosis not present

## 2022-09-04 DIAGNOSIS — D631 Anemia in chronic kidney disease: Secondary | ICD-10-CM | POA: Diagnosis not present

## 2022-09-04 DIAGNOSIS — N1832 Chronic kidney disease, stage 3b: Secondary | ICD-10-CM | POA: Diagnosis not present

## 2022-09-04 DIAGNOSIS — N2581 Secondary hyperparathyroidism of renal origin: Secondary | ICD-10-CM | POA: Diagnosis not present

## 2022-09-04 DIAGNOSIS — E119 Type 2 diabetes mellitus without complications: Secondary | ICD-10-CM | POA: Diagnosis not present

## 2022-09-04 DIAGNOSIS — R600 Localized edema: Secondary | ICD-10-CM | POA: Diagnosis not present

## 2022-09-04 DIAGNOSIS — N189 Chronic kidney disease, unspecified: Secondary | ICD-10-CM | POA: Diagnosis not present

## 2022-09-07 ENCOUNTER — Ambulatory Visit: Payer: Medicare HMO | Admitting: Acute Care

## 2022-09-10 ENCOUNTER — Encounter (HOSPITAL_COMMUNITY)
Admission: RE | Admit: 2022-09-10 | Discharge: 2022-09-10 | Disposition: A | Discharge status: Home / Self Care | Payer: Medicare HMO | Source: Ambulatory Visit | Attending: Pulmonary Disease | Admitting: Pulmonary Disease

## 2022-09-10 DIAGNOSIS — R911 Solitary pulmonary nodule: Secondary | ICD-10-CM | POA: Diagnosis not present

## 2022-09-10 DIAGNOSIS — Z8589 Personal history of malignant neoplasm of other organs and systems: Secondary | ICD-10-CM | POA: Diagnosis not present

## 2022-09-10 DIAGNOSIS — C3432 Malignant neoplasm of lower lobe, left bronchus or lung: Secondary | ICD-10-CM | POA: Insufficient documentation

## 2022-09-10 LAB — GLUCOSE, CAPILLARY: Glucose-Capillary: 98 mg/dL (ref 70–99)

## 2022-09-10 MED ORDER — FLUDEOXYGLUCOSE F - 18 (FDG) INJECTION
7.9900 | Freq: Once | INTRAVENOUS | Status: AC
  Administered 2022-09-10: 7.99 via INTRAVENOUS

## 2022-09-11 ENCOUNTER — Other Ambulatory Visit: Payer: Self-pay | Admitting: Family Medicine

## 2022-09-11 NOTE — Telephone Encounter (Signed)
Prescription Request  09/11/2022  LOV: 04/17/2022  What is the name of the medication or equipment?   hydrALAZINE (APRESOLINE) 50 MG tablet   Have you contacted your pharmacy to request a refill? Yes   Which pharmacy would you like this sent to?  [id number] [place], [place] - [address] [place] [address] Phone: [phone] Fax: [phone]    Patient notified that their request is being sent to the clinical staff for review and that they should receive a response within 2 business days.   Please advise pharmacist.

## 2022-09-12 MED ORDER — HYDRALAZINE HCL 50 MG PO TABS: 50.0000 mg | ORAL_TABLET | Freq: Three times a day (TID) | ORAL | 0 refills | Status: DC

## 2022-09-12 NOTE — Telephone Encounter (Signed)
OV 04/17/22- fails lab protocol- but provider is aware of values Next visit 10/16/22 Requested Prescriptions  Pending Prescriptions Disp Refills   hydrALAZINE (APRESOLINE) 50 MG tablet 270 tablet 1    Sig: Take 1 tablet (50 mg total) by mouth 3 (three) times daily.     Cardiovascular:  Vasodilators Failed - 09/11/2022  2:59 PM      Failed - HCT in normal range and within 360 days    HCT  Date Value Ref Range Status  04/17/2022 28.7 (L) 38.5 - 50.0 % Final         Failed - HGB in normal range and within 360 days    Hemoglobin  Date Value Ref Range Status  04/17/2022 9.4 (L) 13.2 - 17.1 g/dL Final  72/53/6644 03.4 (L) 13.0 - 17.0 g/dL Final         Failed - RBC in normal range and within 360 days    RBC  Date Value Ref Range Status  04/17/2022 3.12 (L) 4.20 - 5.80 Million/uL Final         Failed - WBC in normal range and within 360 days    WBC  Date Value Ref Range Status  04/17/2022 2.5 (L) 3.8 - 10.8 Thousand/uL Final         Failed - ANA Screen, Ifa, Serum in normal range and within 360 days    No results found for: "ANA", "ANATITER", "LABANTI"       Failed - Last BP in normal range    BP Readings from Last 1 Encounters:  08/07/22 (!) 140/80         Failed - Valid encounter within last 12 months    Recent Outpatient Visits           1 year ago Atrial fibrillation, unspecified type (HCC)   [facility]ard, Priscille Heidelberg, MD   1 year ago Atrial fibrillation, unspecified type (HCC)   [facility], Priscille Heidelberg, MD   1 year ago Atrial fibrillation, unspecified type [facility])   [facility]ard, Priscille Heidelberg, MD   1 year ago Atrial fibrillation, unspecified type [facility])   [facility], Priscille Heidelberg, MD   1 year ago Atrial fibrillation, unspecified type [facility])   [facility]ard, Priscille Heidelberg, MD       Future Appointments             In 1 month Rollene Rotunda, MD [facility]  [facility] at [facility] - PLT in normal range and within 360 days    Platelets  Date Value Ref Range Status  04/17/2022 142 140 - 400 Thousand/uL Final   Platelet Count  Date Value Ref Range Status  09/16/2020 161 150 - 400 K/uL Final

## 2022-09-13 ENCOUNTER — Encounter: Payer: Self-pay | Admitting: Cardiology

## 2022-09-20 ENCOUNTER — Telehealth: Payer: Self-pay | Admitting: Acute Care

## 2022-09-20 NOTE — Telephone Encounter (Signed)
I spoke with the patients son. I went over the PET  results. We discussed bronch vs thoracentesis and evaluation of pleural fluid. The patient has a moderate left effusion . We discussed tapping the effusion  to see of we get malignant cells vs bronch, which will require general anesthesia. He will most likely need cards clearance for bronch. I told the son Brian [name] I would talk with Dr. [name]  and then we would proceed with one or the other. Pt. Son is in agreement with the plan.  Please place on Dr. Marjory Lies schedule on 8/19 as nodule follow up. He said ok to double book. Thanks so much

## 2022-09-20 NOTE — Telephone Encounter (Signed)
Left a voicemail message to call back for scheduling the appointment for Dr. [name]

## 2022-09-26 ENCOUNTER — Other Ambulatory Visit: Payer: Self-pay | Admitting: Family Medicine

## 2022-09-26 NOTE — Telephone Encounter (Signed)
Requested medication (s) are due for refill today: Rosita Fire  Requested medication (s) are on the active medication list: Yes  Last refill:  09/15/22  Future visit scheduled: Yes  Notes to clinic:  Pharmacy requesting a 90 day supply, already sent a 30 day supply, routing to the provider for approval.     Requested Prescriptions  Pending Prescriptions Disp Refills   hydrALAZINE (APRESOLINE) 50 MG tablet 90 tablet 0    Sig: Take 1 tablet (50 mg total) by mouth 3 (three) times daily.     Cardiovascular:  Vasodilators Failed - 09/26/2022 10:29 AM      Failed - HCT in normal range and within 360 days    HCT  Date Value Ref Range Status  04/17/2022 28.7 (L) 38.5 - 50.0 % Final         Failed - HGB in normal range and within 360 days    Hemoglobin  Date Value Ref Range Status  04/17/2022 9.4 (L) 13.2 - 17.1 g/dL Final  15/17/6160 73.7 (L) 13.0 - 17.0 g/dL Final         Failed - RBC in normal range and within 360 days    RBC  Date Value Ref Range Status  04/17/2022 3.12 (L) 4.20 - 5.80 Million/uL Final         Failed - WBC in normal range and within 360 days    WBC  Date Value Ref Range Status  04/17/2022 2.5 (L) 3.8 - 10.8 Thousand/uL Final         Failed - ANA Screen, Ifa, Serum in normal range and within 360 days    No results found for: "ANA", "ANATITER", "LABANTI"       Failed - Last BP in normal range    BP Readings from Last 1 Encounters:  08/07/22 (!) 140/80         Failed - Valid encounter within last 12 months    Recent Outpatient Visits           1 year ago Atrial fibrillation, unspecified type (HCC)   [facility]ard, Priscille Heidelberg, MD   1 year ago Atrial fibrillation, unspecified type [facility])   [facility], Priscille Heidelberg, MD   1 year ago Atrial fibrillation, unspecified type [facility])   [facility] Pickard, Priscille Heidelberg, MD   1 year ago Atrial fibrillation, unspecified type [facility])   [facility]  Pickard, Priscille Heidelberg, MD   1 year ago Atrial fibrillation, unspecified type [facility])   [facility] Pickard, Priscille Heidelberg, MD       Future Appointments             In 1 week Icard, Rachel Bo, DO [place] [facility] - PLT in normal range and within 360 days    Platelets  Date Value Ref Range Status  04/17/2022 142 140 - 400 Thousand/uL Final   Platelet Count  Date Value Ref Range Status  09/16/2020 161 150 - 400 K/uL Final

## 2022-09-26 NOTE — Telephone Encounter (Signed)
Prescription Request  09/26/2022  LOV: 04/17/2022  What is the name of the medication or equipment? hydrALAZINE (APRESOLINE) 50 MG tablet   Have you contacted your pharmacy to request a refill? Yes 90 day supply requested and on the refill request it wanted to make sure the directions for use are take 1 tablet by mouth 3 times a day  Which pharmacy would you like this sent to?  [id number] [place], [place] - [address] [place] [address] Phone: [phone] Fax: [phone]    Patient notified that their request is being sent to the clinical staff for review and that they should receive a response within 2 business days.   Please advise at [facility] [phone]

## 2022-09-27 ENCOUNTER — Ambulatory Visit: Payer: Medicare HMO | Admitting: Hematology and Oncology

## 2022-10-01 ENCOUNTER — Other Ambulatory Visit: Payer: Self-pay | Admitting: Family Medicine

## 2022-10-01 NOTE — Telephone Encounter (Signed)
Prescription Request  10/01/2022  LOV: 04/17/2022  What is the name of the medication or equipment? diltiazem (CARDIZEM CD) 120 MG 24 hr capsule   Have you contacted your pharmacy to request a refill? Yes   Which pharmacy would you like this sent to?  [id number] [place], [place] - [address] [place] [address] Phone: [phone] Fax: [phone]    Patient notified that their request is being sent to the clinical staff for review and that they should receive a response within 2 business days.   Please advise at [facility] [phone]

## 2022-10-01 NOTE — Telephone Encounter (Signed)
A second request was received from pharmacy.

## 2022-10-02 MED ORDER — HYDRALAZINE HCL 50 MG PO TABS: 50.0000 mg | ORAL_TABLET | Freq: Three times a day (TID) | ORAL | 0 refills | Status: DC

## 2022-10-03 MED ORDER — DILTIAZEM HCL ER COATED BEADS 120 MG PO CP24: 240.0000 mg | ORAL_CAPSULE | Freq: Every day | ORAL | 0 refills | Status: DC

## 2022-10-03 NOTE — Telephone Encounter (Signed)
Requested Prescriptions  Pending Prescriptions Disp Refills   diltiazem (CARDIZEM CD) 120 MG 24 hr capsule 180 capsule 0    Sig: Take 2 capsules (240 mg total) by mouth daily.     Cardiovascular: Calcium Channel Blockers 3 Failed - 10/01/2022  3:46 PM      Failed - Cr in normal range and within 360 days    Creat  Date Value Ref Range Status  04/17/2022 2.07 (H) 0.70 - 1.35 mg/dL Final   Creatinine, Urine  Date Value Ref Range Status  12/12/2009 226.8 mg/dL Final         Failed - Last BP in normal range    BP Readings from Last 1 Encounters:  08/07/22 (!) 140/80         Failed - Last Heart Rate in normal range    Pulse Readings from Last 1 Encounters:  08/07/22 (!) 145         Failed - Valid encounter within last 6 months    Recent Outpatient Visits           1 year ago Atrial fibrillation, unspecified type (HCC)   [facility] Pickard, Priscille Heidelberg, MD   1 year ago Atrial fibrillation, unspecified type [facility])   [facility] Pickard, Priscille Heidelberg, MD   1 year ago Atrial fibrillation, unspecified type [facility])   [facility] Pickard, Priscille Heidelberg, MD   1 year ago Atrial fibrillation, unspecified type [facility])   [facility] [name], MD   1 year ago Atrial fibrillation, unspecified type [facility])   [facility] Pickard, Priscille Heidelberg, MD       Future Appointments             In 5 days Icard, Rachel Bo, DO [facility] - ALT in normal range and within 360 days    ALT  Date Value Ref Range Status  04/17/2022 21 9 - 46 U/L Final  03/20/2021 13 0 - 44 U/L Final         Passed - AST in normal range and within 360 days    AST  Date Value Ref Range Status  04/17/2022 17 10 - 35 U/L Final  03/20/2021 14 (L) 15 - 41 U/L Final

## 2022-10-07 NOTE — Progress Notes (Deleted)
Synopsis: Referred in June 2024 for abnormal CT chest by [name], MD  Subjective:   PATIENT ID: Brian [name] GENDER: male DOB: Mar 22, 1955, MRN: [id number]  No chief complaint on file.   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 [name].  Follow-up CT imaging has revealed adenopathy within the chest as well as a lesion in the left hilum.  Patient was referred here for consideration of bronchoscopy and biopsy.  He is very upset today.  He is dealing a lot with things at home.  He is currently caring for his 46 year old mother.  He is very distraught about this information and is not sure what to do.  I encouraged him for consideration of bronchoscopy and biopsy we can come up with a better plan to move forward he has declined intervention at this time.  He would like to have a PET scan complete.  He states that if he does find out that he has "cancer everywhere" he does not want to do anything with it and would not want to undergo additional treatments at this time.  OV 10/08/2022:Patient here today for follow-up after recent nuclear medicine PET scan that was completed on 09/10/2022.  This reveals hypermetabolic mediastinal and left hilar adenopathy consistent with recurrent of disease.  Also has a moderate left-sided pleural effusion that is increased from his June imaging.      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 [name], laser therapy OD   Seizures (HCC)    pt reports this is from low blood sugar     Family  History  Problem Relation Age of Onset   Colon cancer Mother    Hyperlipidemia Father    Hypertension Father    Breast cancer Sister    Coronary artery disease Neg Hx        no early CAD.   Stomach cancer Neg Hx    Rectal cancer Neg Hx    Esophageal cancer Neg Hx      Past Surgical History:  Procedure Laterality Date   COLONOSCOPY     DIRECT LARYNGOSCOPY N/A 02/10/2020   Procedure: DIRECT LARYNGOSCOPY WITH BIOPSY;  Surgeon: Newman Pies, MD;  Location: [facility] OR;  Service: ENT;  Laterality: N/A;   Ear surgery as a child     IR GASTROSTOMY TUBE MOD SED  03/23/2020   IR GASTROSTOMY TUBE REMOVAL  06/01/2020   IR IMAGING GUIDED PORT INSERTION  03/09/2020   IR REMOVAL TUN ACCESS W/ PORT W/O FL MOD SED  02/08/2021   TONSILLECTOMY      Social History   Socioeconomic History   Marital status: Divorced    Spouse name: Not on file   Number of children: 2   Years of education: 12   Highest education level: 12th grade  Occupational History   Occupation: Disabled  Tobacco Use   Smoking status: Never    Passive exposure: Never   Smokeless tobacco: Never   Tobacco comments:  Verified by son, Brian [name]  Vaping Use   Vaping status: Never Used  Substance and Sexual Activity   Alcohol use: Yes    Alcohol/week: 0.0 standard drinks of alcohol    Comment: seldom   Drug use: Yes    Frequency: 7.0 times per week    Comment: marijauna for arthritis, last 03-13-20   Sexual activity: Not Currently    Partners: Female  Other Topics Concern   Not on file  Social History Narrative   Two children.  5 grandchildren.     Social Determinants of Health   Financial Resource Strain: Low Risk  (04/05/2022)   Overall Financial Resource Strain (CARDIA)    Difficulty of Paying Living Expenses: Not hard at all  Food Insecurity: No Food Insecurity (04/05/2022)   Hunger Vital Sign    Worried About Running Out of Food in the Last Year: Never true    Ran Out of Food in the Last Year: Never true   Transportation Needs: No Transportation Needs (04/05/2022)   PRAPARE - Administrator, Civil Service (Medical): No    Lack of Transportation (Non-Medical): No  Physical Activity: Inactive (04/05/2022)   Exercise Vital Sign    Days of Exercise per Week: 0 days    Minutes of Exercise per Session: 0 min  Stress: No Stress Concern Present (04/05/2022)   [employer] of Occupational Health - Occupational Stress Questionnaire    Feeling of Stress : Not at all  Social Connections: Socially Isolated (04/05/2022)   Social Connection and Isolation Panel [NHANES]    Frequency of Communication with Friends and Family: Twice a week    Frequency of Social Gatherings with Friends and Family: Never    Attends Religious Services: Never    Database administrator or Organizations: No    Attends Banker Meetings: Never    Marital Status: Divorced  Catering manager Violence: Not At Risk (04/05/2022)   Humiliation, Afraid, Rape, and Kick questionnaire    Fear of Current or Ex-Partner: No    Emotionally Abused: No    Physically Abused: No    Sexually Abused: No     Allergies  Allergen Reactions   Codeine Other (See Comments)    Unknown reaction, severe headach   Metformin And Related Diarrhea     Outpatient Medications Prior to Visit  Medication Sig Dispense Refill   apixaban (ELIQUIS) 5 MG TABS tablet Take 1 tablet (5 mg total) by mouth 2 (two) times daily. 60 tablet 5   atorvastatin (LIPITOR) 20 MG tablet Take 1 tablet (20 mg total) by mouth daily. 90 tablet 3   Blood Glucose Monitoring Suppl (ONETOUCH VERIO IQ SYSTEM) w/Device KIT 1 each by Does not apply route 2 (two) times daily. 1 kit 0   carvedilol (COREG) 25 MG tablet Take 1 tablet (25 mg total) by mouth 2 (two) times daily. 60 tablet 2   Continuous Blood Gluc Sensor (FREESTYLE LIBRE 2 SENSOR) MISC Use as directed to monitor blood glucose continuously. Change sensor Q 14 days. DX E11.65 2 each 11   diltiazem  (CARDIZEM CD) 120 MG 24 hr capsule Take 2 capsules (240 mg total) by mouth daily. 180 capsule 0   diltiazem (CARDIZEM CD) 240 MG 24 hr capsule Take 1 capsule (240 mg total) by mouth daily. 90 capsule 1   doxazosin (CARDURA) 2 MG tablet Take 1 tablet (2 mg total) by mouth daily. 30 tablet 3   ferrous sulfate 325 (65 FE) MG  EC tablet Take 325 mg by mouth 3 (three) times daily with meals.     furosemide (LASIX) 40 MG tablet TAKE 1 TABLET (40 MG TOTAL) BY MOUTH AS NEEDED FOR FLUID OR EDEMA. 90 tablet 3   glucose blood test strip 1 each by Other route as needed for other. Use as instructed 100 each 11   hydrALAZINE (APRESOLINE) 50 MG tablet Take 1 tablet (50 mg total) by mouth 3 (three) times daily. 90 tablet 0   losartan (COZAAR) 100 MG tablet TAKE 1 TABLET BY MOUTH EVERY DAY 90 tablet 3   pioglitazone (ACTOS) 30 MG tablet Take 1 tablet (30 mg total) by mouth daily. 90 tablet 0   No facility-administered medications prior to visit.    ROS   Objective:  Physical Exam   There were no vitals filed for this visit.    on RA BMI Readings from Last 3 Encounters:  08/07/22 23.66 kg/m  04/17/22 26.49 kg/m  04/16/22 26.35 kg/m   Wt Readings from Last 3 Encounters:  08/07/22 160 lb 3.2 oz (72.7 kg)  04/17/22 179 lb 6.4 oz (81.4 kg)  04/16/22 178 lb 6.4 oz (80.9 kg)     CBC    Component Value Date/Time   WBC 2.5 (L) 04/17/2022 1144   RBC 3.12 (L) 04/17/2022 1144   HGB 9.4 (L) 04/17/2022 1144   HGB 11.9 (L) 09/16/2020 1540   HCT 28.7 (L) 04/17/2022 1144   PLT 142 04/17/2022 1144   PLT 161 09/16/2020 1540   MCV 92.0 04/17/2022 1144   MCH 30.1 04/17/2022 1144   MCHC 32.8 04/17/2022 1144   RDW 13.6 04/17/2022 1144   LYMPHSABS 255 (L) 04/17/2022 1144   MONOABS 0.3 11/09/2021 0726   EOSABS 10 (L) 04/17/2022 1144   BASOSABS 0 04/17/2022 1144     Chest Imaging:  June 2024 CT chest: Left infrahilar adenopathy, mediastinal adenopathy, near occlusion of the left lower lobe bronchus  concerning for endobronchial lesion The patient's images have been independently reviewed by me.    July 2024 nuclear medicine pet imaging: Adenopathy within the mediastinum and hilum concerning for malignancy, possible endobronchial lesion hypermetabolic with obstructive collapse Worsening effusion. The patient's images have been independently reviewed by me.    Pulmonary Functions Testing Results:    Latest Ref Rng & Units 06/07/2021   12:54 PM  PFT Results  FVC-Pre L 3.95   FVC-Predicted Pre % 90   FVC-Post L 4.03   FVC-Predicted Post % 92   Pre FEV1/FVC % % 83   Post FEV1/FCV % % 83   FEV1-Pre L 3.27   FEV1-Predicted Pre % 100   FEV1-Post L 3.34   DLCO uncorrected ml/min/mmHg 15.35   DLCO UNC% % 59   DLCO corrected ml/min/mmHg 18.26   DLCO COR %Predicted % 71   DLVA Predicted % 75   TLC L 6.09   TLC % Predicted % 89   RV % Predicted % 73     FeNO:   Pathology:   Echocardiogram:   Heart Catheterization:     Assessment & Plan:     ICD-10-CM   1. 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 History of head and neck cancer  Z85.89        Discussion:  This is a 67 year old gentleman history of tobacco abuse, history of head neck cancer now with abnormal CT of the chest which has left lung lesion possible left endobronchial cancer  as well as adenopathy in the chest.  Overall CT imaging is concerning for recurrence of disease or new primary.  Plan: Today in the office we talked about the risk benefits and alternatives to consideration of bronchoscopy and biopsy. The patient is very distraught about this information. Seems like he is going through very trying time at home caring for his 32 year old mother. He is not interested in having a bronchoscopy at this time. He is okay with consideration of having a PET scan.  He is concerned that if he has malignancy or cancer that has spread everywhere that he would not want to undergo additional  treatments to include chemotherapy or radiation. I have stated that we would go ahead and move forward with the PET scan and give him time to process the information that was reviewed today in the office. Patient is agreeable to this plan. He is, see [name] back in clinic after the PET scan is complete to discuss consideration for bronchoscopy and biopsy at that point. Hopefully the patient will have an opportunity to have any additional questions answered.  If he does have evidence on the PET scan advanced age malignancy and declines treatment would consider referral to hospice care at that time.   Current Outpatient Medications:    apixaban (ELIQUIS) 5 MG TABS tablet, Take 1 tablet (5 mg total) by mouth 2 (two) times daily., Disp: 60 tablet, Rfl: 5   atorvastatin (LIPITOR) 20 MG tablet, Take 1 tablet (20 mg total) by mouth daily., Disp: 90 tablet, Rfl: 3   Blood Glucose Monitoring Suppl (ONETOUCH VERIO IQ SYSTEM) w/Device KIT, 1 each by Does not apply route 2 (two) times daily., Disp: 1 kit, Rfl: 0   carvedilol (COREG) 25 MG tablet, Take 1 tablet (25 mg total) by mouth 2 (two) times daily., Disp: 60 tablet, Rfl: 2   Continuous Blood Gluc Sensor (FREESTYLE LIBRE 2 SENSOR) MISC, Use as directed to monitor blood glucose continuously. Change sensor Q 14 days. DX E11.65, Disp: 2 each, Rfl: 11   diltiazem (CARDIZEM CD) 120 MG 24 hr capsule, Take 2 capsules (240 mg total) by mouth daily., Disp: 180 capsule, Rfl: 0   diltiazem (CARDIZEM CD) 240 MG 24 hr capsule, Take 1 capsule (240 mg total) by mouth daily., Disp: 90 capsule, Rfl: 1   doxazosin (CARDURA) 2 MG tablet, Take 1 tablet (2 mg total) by mouth daily., Disp: 30 tablet, Rfl: 3   ferrous sulfate 325 (65 FE) MG EC tablet, Take 325 mg by mouth 3 (three) times daily with meals., Disp: , Rfl:    furosemide (LASIX) 40 MG tablet, TAKE 1 TABLET (40 MG TOTAL) BY MOUTH AS NEEDED FOR FLUID OR EDEMA., Disp: 90 tablet, Rfl: 3   glucose blood test strip, 1 each by  Other route as needed for other. Use as instructed, Disp: 100 each, Rfl: 11   hydrALAZINE (APRESOLINE) 50 MG tablet, Take 1 tablet (50 mg total) by mouth 3 (three) times daily., Disp: 90 tablet, Rfl: 0   losartan (COZAAR) 100 MG tablet, TAKE 1 TABLET BY MOUTH EVERY DAY, Disp: 90 tablet, Rfl: 3   pioglitazone (ACTOS) 30 MG tablet, Take 1 tablet (30 mg total) by mouth daily., Disp: 90 tablet, Rfl: 0  I spent 62 minutes dedicated to the care of this patient on the date of this encounter to include pre-visit review of records, face-to-face time with the patient discussing conditions above, post visit ordering of testing, clinical documentation with the electronic health record, making  appropriate referrals as documented, and communicating necessary findings to members of the patients care team.    Josephine Igo, DO [facility] 10/07/2022 2:25 PM

## 2022-10-08 ENCOUNTER — Ambulatory Visit: Payer: Medicare HMO | Admitting: Pulmonary Disease

## 2022-10-08 DIAGNOSIS — C3432 Malignant neoplasm of lower lobe, left bronchus or lung: Secondary | ICD-10-CM

## 2022-10-08 DIAGNOSIS — Z8589 Personal history of malignant neoplasm of other organs and systems: Secondary | ICD-10-CM

## 2022-10-16 ENCOUNTER — Ambulatory Visit (INDEPENDENT_AMBULATORY_CARE_PROVIDER_SITE_OTHER): Payer: Medicare HMO | Admitting: Family Medicine

## 2022-10-16 VITALS — BP 130/68 | HR 58 | Temp 97.6°F | Ht 69.0 in | Wt 163.4 lb

## 2022-10-16 DIAGNOSIS — N1832 Chronic kidney disease, stage 3b: Secondary | ICD-10-CM

## 2022-10-16 DIAGNOSIS — I10 Essential (primary) hypertension: Secondary | ICD-10-CM | POA: Diagnosis not present

## 2022-10-16 DIAGNOSIS — E1169 Type 2 diabetes mellitus with other specified complication: Secondary | ICD-10-CM

## 2022-10-16 DIAGNOSIS — Z7984 Long term (current) use of oral hypoglycemic drugs: Secondary | ICD-10-CM | POA: Diagnosis not present

## 2022-10-16 DIAGNOSIS — I480 Paroxysmal atrial fibrillation: Secondary | ICD-10-CM

## 2022-10-16 DIAGNOSIS — M720 Palmar fascial fibromatosis [Dupuytren]: Secondary | ICD-10-CM | POA: Diagnosis not present

## 2022-10-16 DIAGNOSIS — E669 Obesity, unspecified: Secondary | ICD-10-CM | POA: Diagnosis not present

## 2022-10-16 MED ORDER — APIXABAN 5 MG PO TABS
5.0000 mg | ORAL_TABLET | Freq: Two times a day (BID) | ORAL | 5 refills | Status: DC
Start: 2022-10-16 — End: 2023-11-07

## 2022-10-16 MED ORDER — LOSARTAN POTASSIUM 100 MG PO TABS
100.0000 mg | ORAL_TABLET | Freq: Every day | ORAL | 1 refills | Status: DC
Start: 2022-10-16 — End: 2023-01-10

## 2022-10-16 MED ORDER — CARVEDILOL 25 MG PO TABS: 25.0000 mg | ORAL_TABLET | Freq: Two times a day (BID) | ORAL | 3 refills | Status: DC

## 2022-10-16 NOTE — Progress Notes (Signed)
Subjective:    Patient ID: Brian [name], male    DOB: 10/23/1955, 67 y.o.   MRN: [id number]  HPI His biggest concern is a contracture in his left hand.  His third fourth and fifth fingers are contracted at the MCP joint, PIP joint, and DIP joint.  I am unable to passively extend the MCP joint, PIP joint, or DIP joint.  There are no palpable scar tissue in the palm of his hand however I suspect Dupuytren's contracture.  He states that it gradually got like this over the last 4 to 5 months.  He does have a history of stroke.  However he denies any new neurologic deficit.  He is unable to abduct his left arm greater than 90 degrees but this is due to an old shoulder injury.  He has full flexion and extension of his elbow without weakness.  He is able to move his thumb and index finger fully without any weakness.  There is no numbness or tingling in his left upper extremity.  It is limited to a flexor contraction of the MCP and PIP and DIP joints.  Original consult hand surgery in February but the patient states that he never heard from them.  He is currently only on Actos for diabetes.  His hemoglobin A1c in February was 6.5.  He is not checking his sugars.  He is currently taking Eliquis for atrial fibrillation.  However he is only taking it once a day.  He also has stopped his carvedilol several months ago.  As result, today on exam, he is tachycardic.  He does report some shortness of breath with activity but he denies any chest pain.  His heart rate is well above 100 today.  On my exam is 110 bpm.  There is +2 pitting edema in both legs distal to the knee.  However his lungs are clear to auscultation bilaterally. Past Medical History:  Diagnosis Date   Arthritis    Atrial fibrillation (HCC)    Cancer (HCC)    SCC base of tongue 2021   Cardiomyopathy    Cerebrovascular accident [facility])    2012   Diabetes mellitus    Hyperlipidemia    Hypertension    Left-sided weakness    Proliferative  retinopathy due to DM (HCC)    Dr. [name], laser therapy OD   Seizures (HCC)    pt reports this is from low blood sugar   Past Surgical History:  Procedure Laterality Date   COLONOSCOPY     DIRECT LARYNGOSCOPY N/A 02/10/2020   Procedure: DIRECT LARYNGOSCOPY WITH BIOPSY;  Surgeon: Newman Pies, MD;  Location: [facility] OR;  Service: ENT;  Laterality: N/A;   Ear surgery as a child     IR GASTROSTOMY TUBE MOD SED  03/23/2020   IR GASTROSTOMY TUBE REMOVAL  06/01/2020   IR IMAGING GUIDED PORT INSERTION  03/09/2020   IR REMOVAL TUN ACCESS W/ PORT W/O FL MOD SED  02/08/2021   TONSILLECTOMY     Social History   Socioeconomic History   Marital status: Divorced    Spouse name: Not on file   Number of children: 2   Years of education: 12   Highest education level: 12th grade  Occupational History   Occupation: Disabled  Tobacco Use   Smoking status: Never    Passive exposure: Never   Smokeless tobacco: Never   Tobacco comments:    Verified by son, Viraj Mcinerny  Vaping Use   Vaping status: Never  Used  Substance and Sexual Activity   Alcohol use: Yes    Alcohol/week: 0.0 standard drinks of alcohol    Comment: seldom   Drug use: Yes    Frequency: 7.0 times per week    Comment: marijauna for arthritis, last 03-13-20   Sexual activity: Not Currently    Partners: Female  Other Topics Concern   Not on file  Social History Narrative   Two children.  5 grandchildren.     Social Determinants of Health   Financial Resource Strain: Low Risk  (04/05/2022)   Overall Financial Resource Strain (CARDIA)    Difficulty of Paying Living Expenses: Not hard at all  Food Insecurity: No Food Insecurity (04/05/2022)   Hunger Vital Sign    Worried About Running Out of Food in the Last Year: Never true    Ran Out of Food in the Last Year: Never true  Transportation Needs: No Transportation Needs (04/05/2022)   PRAPARE - Administrator, Civil Service (Medical): No    Lack of Transportation  (Non-Medical): No  Physical Activity: Inactive (04/05/2022)   Exercise Vital Sign    Days of Exercise per Week: 0 days    Minutes of Exercise per Session: 0 min  Stress: No Stress Concern Present (04/05/2022)   [employer] of Occupational Health - Occupational Stress Questionnaire    Feeling of Stress : Not at all  Social Connections: Socially Isolated (04/05/2022)   Social Connection and Isolation Panel [NHANES]    Frequency of Communication with Friends and Family: Twice a week    Frequency of Social Gatherings with Friends and Family: Never    Attends Religious Services: Never    Database administrator or Organizations: No    Attends Banker Meetings: Never    Marital Status: Divorced  Catering manager Violence: Not At Risk (04/05/2022)   Humiliation, Afraid, Rape, and Kick questionnaire    Fear of Current or Ex-Partner: No    Emotionally Abused: No    Physically Abused: No    Sexually Abused: No   Current Outpatient Medications on File Prior to Visit  Medication Sig Dispense Refill   atorvastatin (LIPITOR) 20 MG tablet Take 1 tablet (20 mg total) by mouth daily. 90 tablet 3   Blood Glucose Monitoring Suppl (ONETOUCH VERIO IQ SYSTEM) w/Device KIT 1 each by Does not apply route 2 (two) times daily. 1 kit 0   Continuous Blood Gluc Sensor (FREESTYLE LIBRE 2 SENSOR) MISC Use as directed to monitor blood glucose continuously. Change sensor Q 14 days. DX E11.65 2 each 11   diltiazem (CARDIZEM CD) 240 MG 24 hr capsule Take 1 capsule (240 mg total) by mouth daily. 90 capsule 1   doxazosin (CARDURA) 2 MG tablet Take 1 tablet (2 mg total) by mouth daily. 30 tablet 3   ferrous sulfate 325 (65 FE) MG EC tablet Take 325 mg by mouth 3 (three) times daily with meals.     furosemide (LASIX) 40 MG tablet TAKE 1 TABLET (40 MG TOTAL) BY MOUTH AS NEEDED FOR FLUID OR EDEMA. 90 tablet 3   glucose blood test strip 1 each by Other route as needed for other. Use as instructed 100 each 11    hydrALAZINE (APRESOLINE) 50 MG tablet Take 1 tablet (50 mg total) by mouth 3 (three) times daily. 90 tablet 0   pioglitazone (ACTOS) 30 MG tablet Take 1 tablet (30 mg total) by mouth daily. 90 tablet 0   No current facility-administered  medications on file prior to visit.   Allergies  Allergen Reactions   Codeine Other (See Comments)    Unknown reaction, severe headach   Metformin And Related Diarrhea     Review of Systems  All other systems reviewed and are negative.      Objective:   Physical Exam Vitals reviewed.  Constitutional:      Appearance: Normal appearance. He is normal weight.  Cardiovascular:     Rate and Rhythm: Tachycardia present. Rhythm irregular.     Heart sounds: Normal heart sounds. No murmur heard.    No friction rub. No gallop.  Pulmonary:     Effort: Pulmonary effort is normal.     Breath sounds: Normal breath sounds. No wheezing, rhonchi or rales.  Abdominal:     General: Abdomen is flat. Bowel sounds are normal.     Palpations: Abdomen is soft.  Musculoskeletal:     Right lower leg: Edema present.     Left lower leg: Edema present.  Neurological:     Mental Status: He is alert and oriented to person, place, and time. Mental status is at baseline.     Motor: Weakness present.   See hand description in HPI    Assessment & Plan:  Essential hypertension, benign - Plan: losartan (COZAAR) 100 MG tablet  Paroxysmal atrial fibrillation (HCC) - Plan: apixaban (ELIQUIS) 5 MG TABS tablet, Brain natriuretic peptide  Dupuytren contracture - Plan: Ambulatory referral to Hand Surgery  Stage 3b chronic kidney disease (HCC)  Type 2 diabetes mellitus with obesity (HCC) - Plan: Hemoglobin A1c, CBC with Differential/Platelet, COMPLETE METABOLIC PANEL WITH GFR, Lipid panel  The question is whether the patient has Dupuytren's contracture in the left hand or whether this is a contracture related to his previous stroke.  Given his range of motion, the fact he  still has use of his thumb and his index finger, has normal sensation, and the fact that I cannot passively extend the 3 digits at the MCP joint, I believe the patient is dealing with Dupuytren's contracture.  Therefore I placed another referral to a hand surgeon.  The patient's blood pressure today is elevated.  However, he is profoundly tachycardic.  This is due to not taking his carvedilol.  I refilled his carvedilol today and asked him to resume and immediately.  Also asked him to go home and take 1 dose of Lasix as he is clearly fluid overloaded exam.  I believe this exacerbated by the tachycardia.  Hopefully the Lasix will facilitate diuresis and improving rate control will prevent reaccumulation of fluid.  Check hemoglobin A1c today.  The patient is on Actos due to cost.  This is a poor option and the patient who has peripheral edema.  However he refuses all insulin and namebrand medication.  I would much rather have the patient on Jardiance.  Based on the patient's lab work, may substitute Jardiance.  Does have repeat kidney disease.  Therefore I will need to assess his renal function before starting the Jardiance.  Metformin is not an option for him due to his renal function

## 2022-10-17 ENCOUNTER — Encounter: Payer: Self-pay | Admitting: Pulmonary Disease

## 2022-10-17 ENCOUNTER — Ambulatory Visit (INDEPENDENT_AMBULATORY_CARE_PROVIDER_SITE_OTHER): Payer: Medicare HMO | Admitting: Pulmonary Disease

## 2022-10-17 VITALS — BP 150/120 | HR 139 | Ht 69.0 in | Wt 175.4 lb

## 2022-10-17 DIAGNOSIS — C3432 Malignant neoplasm of lower lobe, left bronchus or lung: Secondary | ICD-10-CM

## 2022-10-17 DIAGNOSIS — J910 Malignant pleural effusion: Secondary | ICD-10-CM | POA: Diagnosis not present

## 2022-10-17 DIAGNOSIS — Z8589 Personal history of malignant neoplasm of other organs and systems: Secondary | ICD-10-CM

## 2022-10-17 LAB — COMPLETE METABOLIC PANEL WITH GFR
AG Ratio: 1.8 (calc) (ref 1.0–2.5)
ALT: 11 U/L (ref 9–46)
AST: 16 U/L (ref 10–35)
Albumin: 4.3 g/dL (ref 3.6–5.1)
Alkaline phosphatase (APISO): 67 U/L (ref 35–144)
BUN/Creatinine Ratio: 12 (calc) (ref 6–22)
BUN: 20 mg/dL (ref 7–25)
CO2: 24 mmol/L (ref 20–32)
Calcium: 9.1 mg/dL (ref 8.6–10.3)
Chloride: 101 mmol/L (ref 98–110)
Creat: 1.69 mg/dL — ABNORMAL HIGH (ref 0.70–1.35)
Globulin: 2.4 g/dL (ref 1.9–3.7)
Glucose, Bld: 210 mg/dL — ABNORMAL HIGH (ref 65–99)
Potassium: 3.4 mmol/L — ABNORMAL LOW (ref 3.5–5.3)
Sodium: 138 mmol/L (ref 135–146)
Total Bilirubin: 0.6 mg/dL (ref 0.2–1.2)
Total Protein: 6.7 g/dL (ref 6.1–8.1)
eGFR: 43 mL/min/{1.73_m2} — ABNORMAL LOW (ref >=60)

## 2022-10-17 LAB — CBC WITH DIFFERENTIAL/PLATELET
Absolute Monocytes: 416 {cells}/uL (ref 200–950)
Basophils Absolute: 11 {cells}/uL (ref 0–200)
Basophils Relative: 0.2 %
Eosinophils Absolute: 11 {cells}/uL — ABNORMAL LOW (ref 15–500)
Eosinophils Relative: 0.2 %
HCT: 32.4 % — ABNORMAL LOW (ref 38.5–50.0)
Hemoglobin: 10.6 g/dL — ABNORMAL LOW (ref 13.2–17.1)
Lymphs Abs: 308 {cells}/uL — ABNORMAL LOW (ref 850–3900)
MCH: 29.4 pg (ref 27.0–33.0)
MCHC: 32.7 g/dL (ref 32.0–36.0)
MCV: 89.8 fL (ref 80.0–100.0)
MPV: 9.1 fL (ref 7.5–12.5)
Monocytes Relative: 7.7 %
Neutro Abs: 4655 {cells}/uL (ref 1500–7800)
Neutrophils Relative %: 86.2 %
Platelets: 218 Thousand/uL (ref 140–400)
RBC: 3.61 Million/uL — ABNORMAL LOW (ref 4.20–5.80)
RDW: 14.1 % (ref 11.0–15.0)
Total Lymphocyte: 5.7 %
WBC: 5.4 Thousand/uL (ref 3.8–10.8)

## 2022-10-17 LAB — HEMOGLOBIN A1C
Hgb A1c MFr Bld: 6.3 %{Hb} — ABNORMAL HIGH (ref <5.7)
Mean Plasma Glucose: 134 mg/dL
eAG (mmol/L): 7.4 mmol/L

## 2022-10-17 LAB — LIPID PANEL
Cholesterol: 124 mg/dL (ref <200)
HDL: 54 mg/dL (ref >=40)
LDL Cholesterol (Calc): 55 mg/dL
Non-HDL Cholesterol (Calc): 70 mg/dL (ref <130)
Total CHOL/HDL Ratio: 2.3 (calc) (ref <5.0)
Triglycerides: 73 mg/dL (ref <150)

## 2022-10-17 LAB — BRAIN NATRIURETIC PEPTIDE: Brain Natriuretic Peptide: 309 pg/mL — ABNORMAL HIGH (ref <100)

## 2022-10-17 NOTE — Progress Notes (Signed)
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 [name].  Follow-up CT imaging has revealed adenopathy within the chest as well as a lesion in the left hilum.  Patient was referred here for consideration of bronchoscopy and biopsy.  He is very upset today.  He is dealing a lot with things at home.  He is currently caring for his 21 year old mother.  He is very distraught about this information and is not sure what to do.  I encouraged him for consideration of bronchoscopy and biopsy we can come up with a better plan to move forward he has declined intervention at this time.  He would like to have a PET scan complete.  He states that if he does find out that he has "cancer everywhere" he does not want to do anything with it and would not want to undergo additional treatments at this time.  OV 10/17/2022:Patient here today for follow-up after recent nuclear medicine PET scan that was completed on 09/10/2022.  This reveals hypermetabolic mediastinal and left hilar adenopathy consistent with recurrent of disease.  Also has a moderate left-sided pleural effusion that is increased from his June imaging.  We reviewed his nuclear medicine pet imaging today in the office.  He has fluid accumulation which is concerning for an effusion on that side on the left with a hypermetabolic lung mass concerning for malignant effusion as well as adenopathy.  He is agreeable however at this time to have bronchoscopy and biopsy.      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 [name], laser therapy OD   Seizures (HCC)    pt reports this is from low blood sugar     Family History  Problem Relation Age of Onset   Colon cancer Mother    Hyperlipidemia Father    Hypertension Father    Breast cancer Sister    Coronary artery disease Neg Hx        no early CAD.   Stomach cancer Neg Hx    Rectal cancer Neg Hx    Esophageal cancer Neg Hx      Past Surgical History:  Procedure Laterality Date   COLONOSCOPY     DIRECT LARYNGOSCOPY N/A 02/10/2020   Procedure: DIRECT LARYNGOSCOPY WITH BIOPSY;  Surgeon: Newman Pies, MD;  Location: [facility] OR;  Service: ENT;  Laterality: N/A;   Ear surgery as a child     IR GASTROSTOMY TUBE MOD SED  03/23/2020   IR GASTROSTOMY TUBE REMOVAL  06/01/2020   IR IMAGING GUIDED PORT INSERTION  03/09/2020   IR REMOVAL TUN ACCESS W/ PORT W/O FL MOD SED  02/08/2021   TONSILLECTOMY      Social History  Socioeconomic History   Marital status: Divorced    Spouse name: Not on file   Number of children: 2   Years of education: 81   Highest education level: 12th grade  Occupational History   Occupation: Disabled  Tobacco Use   Smoking status: Never    Passive exposure: Never   Smokeless tobacco: Never   Tobacco comments:    Verified by son, Bronson Vodicka  Vaping Use   Vaping status: Never Used  Substance and Sexual Activity   Alcohol use: Yes    Alcohol/week: 0.0 standard drinks of alcohol    Comment: seldom   Drug use: Yes    Frequency: 7.0 times per week    Comment: marijauna for arthritis, last 03-13-20   Sexual activity: Not Currently    Partners: Female  Other Topics Concern   Not on file  Social History Narrative   Two children.  5 grandchildren.     Social  Determinants of Health   Financial Resource Strain: Low Risk  (04/05/2022)   Overall Financial Resource Strain (CARDIA)    Difficulty of Paying Living Expenses: Not hard at all  Food Insecurity: No Food Insecurity (04/05/2022)   Hunger Vital Sign    Worried About Running Out of Food in the Last Year: Never true    Ran Out of Food in the Last Year: Never true  Transportation Needs: No Transportation Needs (04/05/2022)   PRAPARE - Administrator, Civil Service (Medical): No    Lack of Transportation (Non-Medical): No  Physical Activity: Inactive (04/05/2022)   Exercise Vital Sign    Days of Exercise per Week: 0 days    Minutes of Exercise per Session: 0 min  Stress: No Stress Concern Present (04/05/2022)   [employer] of Occupational Health - Occupational Stress Questionnaire    Feeling of Stress : Not at all  Social Connections: Socially Isolated (04/05/2022)   Social Connection and Isolation Panel [NHANES]    Frequency of Communication with Friends and Family: Twice a week    Frequency of Social Gatherings with Friends and Family: Never    Attends Religious Services: Never    Database administrator or Organizations: No    Attends Banker Meetings: Never    Marital Status: Divorced  Catering manager Violence: Not At Risk (04/05/2022)   Humiliation, Afraid, Rape, and Kick questionnaire    Fear of Current or Ex-Partner: No    Emotionally Abused: No    Physically Abused: No    Sexually Abused: No     Allergies  Allergen Reactions   Codeine Other (See Comments)    Unknown reaction, severe headach   Metformin And Related Diarrhea     Outpatient Medications Prior to Visit  Medication Sig Dispense Refill   apixaban (ELIQUIS) 5 MG TABS tablet Take 1 tablet (5 mg total) by mouth 2 (two) times daily. 60 tablet 5   atorvastatin (LIPITOR) 20 MG tablet Take 1 tablet (20 mg total) by mouth daily. 90 tablet 3   Blood Glucose Monitoring Suppl (ONETOUCH VERIO IQ  SYSTEM) w/Device KIT 1 each by Does not apply route 2 (two) times daily. 1 kit 0   carvedilol (COREG) 25 MG tablet Take 1 tablet (25 mg total) by mouth 2 (two) times daily. 180 tablet 3   Continuous Blood Gluc Sensor (FREESTYLE LIBRE 2 SENSOR) MISC Use as directed to monitor blood glucose continuously. Change sensor Q 14 days. DX E11.65 2 each 11   diltiazem (CARDIZEM  CD) 240 MG 24 hr capsule Take 1 capsule (240 mg total) by mouth daily. 90 capsule 1   doxazosin (CARDURA) 2 MG tablet Take 1 tablet (2 mg total) by mouth daily. 30 tablet 3   ferrous sulfate 325 (65 FE) MG EC tablet Take 325 mg by mouth 3 (three) times daily with meals.     furosemide (LASIX) 40 MG tablet TAKE 1 TABLET (40 MG TOTAL) BY MOUTH AS NEEDED FOR FLUID OR EDEMA. 90 tablet 3   glucose blood test strip 1 each by Other route as needed for other. Use as instructed 100 each 11   hydrALAZINE (APRESOLINE) 50 MG tablet Take 1 tablet (50 mg total) by mouth 3 (three) times daily. 90 tablet 0   losartan (COZAAR) 100 MG tablet Take 1 tablet (100 mg total) by mouth daily. 90 tablet 1   pioglitazone (ACTOS) 30 MG tablet Take 1 tablet (30 mg total) by mouth daily. 90 tablet 0   No facility-administered medications prior to visit.    Review of Systems  Constitutional:  Negative for chills, fever, malaise/fatigue and weight loss.  HENT:  Negative for hearing loss, sore throat and tinnitus.   Eyes:  Negative for blurred vision and double vision.  Respiratory:  Positive for cough and shortness of breath. Negative for hemoptysis, sputum production, wheezing and stridor.   Cardiovascular:  Negative for chest pain, palpitations, orthopnea, leg swelling and PND.  Gastrointestinal:  Negative for abdominal pain, constipation, diarrhea, heartburn, nausea and vomiting.  Genitourinary:  Negative for dysuria, hematuria and urgency.  Musculoskeletal:  Negative for joint pain and myalgias.  Skin:  Negative for itching and rash.  Neurological:   Negative for dizziness, tingling, weakness and headaches.  Endo/Heme/Allergies:  Negative for environmental allergies. Does not bruise/bleed easily.  Psychiatric/Behavioral:  Negative for depression. The patient is not nervous/anxious and does not have insomnia.   All other systems reviewed and are negative.    Objective:  Physical Exam Vitals reviewed.  Constitutional:      General: He is not in acute distress.    Appearance: He is well-developed.  HENT:     Head: Normocephalic and atraumatic.  Eyes:     General: No scleral icterus.    Conjunctiva/sclera: Conjunctivae normal.     Pupils: Pupils are equal, round, and reactive to light.  Neck:     Vascular: No JVD.     Trachea: No tracheal deviation.  Cardiovascular:     Rate and Rhythm: Normal rate and regular rhythm.     Heart sounds: Normal heart sounds. No murmur heard. Pulmonary:     Effort: Pulmonary effort is normal. No tachypnea, accessory muscle usage or respiratory distress.     Breath sounds: No stridor. No wheezing, rhonchi or rales.     Comments: Absent breath sounds in the left base Abdominal:     General: There is no distension.     Palpations: Abdomen is soft.     Tenderness: There is no abdominal tenderness.  Musculoskeletal:        General: No tenderness.     Cervical back: Neck supple.  Lymphadenopathy:     Cervical: No cervical adenopathy.  Skin:    General: Skin is warm and dry.     Capillary Refill: Capillary refill takes less than 2 seconds.     Findings: No rash.  Neurological:     Mental Status: He is alert and oriented to person, place, and time.  Psychiatric:  Behavior: Behavior normal.      Vitals:   10/17/22 1006  BP: (!) 150/120  Pulse: (!) 139  SpO2: 93%  Weight: 175 lb 6.4 oz (79.6 kg)  Height: 5\' 9"  (1.753 m)    93% on RA BMI Readings from Last 3 Encounters:  10/17/22 25.90 kg/m  10/16/22 24.13 kg/m  08/07/22 23.66 kg/m   Wt Readings from Last 3 Encounters:   10/17/22 175 lb 6.4 oz (79.6 kg)  10/16/22 163 lb 6.4 oz (74.1 kg)  08/07/22 160 lb 3.2 oz (72.7 kg)     CBC    Component Value Date/Time   WBC 5.4 10/16/2022 1145   RBC 3.61 (L) 10/16/2022 1145   HGB 10.6 (L) 10/16/2022 1145   HGB 11.9 (L) 09/16/2020 1540   HCT 32.4 (L) 10/16/2022 1145   PLT 218 10/16/2022 1145   PLT 161 09/16/2020 1540   MCV 89.8 10/16/2022 1145   MCH 29.4 10/16/2022 1145   MCHC 32.7 10/16/2022 1145   RDW 14.1 10/16/2022 1145   LYMPHSABS 308 (L) 10/16/2022 1145   MONOABS 0.3 11/09/2021 0726   EOSABS 11 (L) 10/16/2022 1145   BASOSABS 11 10/16/2022 1145     Chest Imaging:  June 2024 CT chest: Left infrahilar adenopathy, mediastinal adenopathy, near occlusion of the left lower lobe bronchus concerning for endobronchial lesion The patient's images have been independently reviewed by me.    July 2024 nuclear medicine pet imaging: Adenopathy within the mediastinum and hilum concerning for malignancy, possible endobronchial lesion hypermetabolic with obstructive collapse Worsening effusion. The patient's images have been independently reviewed by me.    Bedside ultrasound: Free-flowing effusion in the left chest.  Pulmonary Functions Testing Results:    Latest Ref Rng & Units 06/07/2021   12:54 PM  PFT Results  FVC-Pre L 3.95   FVC-Predicted Pre % 90   FVC-Post L 4.03   FVC-Predicted Post % 92   Pre FEV1/FVC % % 83   Post FEV1/FCV % % 83   FEV1-Pre L 3.27   FEV1-Predicted Pre % 100   FEV1-Post L 3.34   DLCO uncorrected ml/min/mmHg 15.35   DLCO UNC% % 59   DLCO corrected ml/min/mmHg 18.26   DLCO COR %Predicted % 71   DLVA Predicted % 75   TLC L 6.09   TLC % Predicted % 89   RV % Predicted % 73     FeNO:   Pathology:   Echocardiogram:   Heart Catheterization:     Assessment & Plan:     ICD-10-CM   1. 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 History of head and neck cancer  Z85.89 Procedural/ Surgical Case Request: CHEST TUBE INSERTION, VIDEO BRONCHOSCOPY WITHOUT FLUORO, VIDEO BRONCHOSCOPY WITH ENDOBRONCHIAL ULTRASOUND    Ambulatory referral to Pulmonology    3. 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 Working on plan for drainage of the pleural cavity to send for cytology followed by bronchoscopy with biopsy and possible  tumor debulking of the left lower lobe. Patient is agreeable to this plan. He is currently on Eliquis that will need to be held. Tentative bronchoscopy date will be September 10. I offered to do his case on September 3 but he does not have transportation. He is going work on trying to get everything arranged for September 10.  Last dose of Eliquis will need to be Saturday before.    Current Outpatient Medications:    apixaban (ELIQUIS) 5 MG TABS tablet, Take 1 tablet (5 mg total) by mouth 2 (two) times daily., Disp: 60 tablet, Rfl: 5   atorvastatin (LIPITOR) 20 MG tablet, Take 1 tablet (20 mg total) by mouth daily., Disp: 90 tablet, Rfl: 3   Blood Glucose Monitoring Suppl (ONETOUCH VERIO IQ SYSTEM) w/Device KIT, 1 each by Does not apply route 2 (two) times daily., Disp: 1 kit, Rfl: 0   carvedilol (COREG) 25 MG tablet, Take 1 tablet (25 mg total) by mouth 2 (two) times daily., Disp: 180 tablet, Rfl: 3   Continuous Blood Gluc Sensor (FREESTYLE LIBRE 2 SENSOR) MISC, Use as directed to monitor blood glucose continuously. Change sensor Q 14 days. DX  E11.65, Disp: 2 each, Rfl: 11   diltiazem (CARDIZEM CD) 240 MG 24 hr capsule, Take 1 capsule (240 mg total) by mouth daily., Disp: 90 capsule, Rfl: 1   doxazosin (CARDURA) 2 MG tablet, Take 1 tablet (2 mg total) by mouth daily., Disp: 30 tablet, Rfl: 3   ferrous sulfate 325 (65 FE) MG EC tablet, Take 325 mg by mouth 3 (three) times daily with meals., Disp: , Rfl:    furosemide (LASIX) 40 MG tablet, TAKE 1 TABLET (40 MG TOTAL) BY MOUTH AS NEEDED FOR FLUID OR EDEMA., Disp: 90 tablet, Rfl: 3   glucose blood test strip, 1 each by Other route as needed for other. Use as instructed, Disp: 100 each, Rfl: 11   hydrALAZINE (APRESOLINE) 50 MG tablet, Take 1 tablet (50 mg total) by mouth 3 (three) times daily., Disp: 90 tablet, Rfl: 0   losartan (COZAAR) 100 MG tablet, Take 1 tablet (100 mg total) by mouth daily., Disp: 90 tablet, Rfl: 1   pioglitazone (ACTOS) 30 MG tablet, Take 1 tablet (30 mg total) by mouth daily., Disp: 90 tablet, Rfl: 0   Josephine Igo, DO [facility] 10/17/2022 10:30 AM

## 2022-10-17 NOTE — Patient Instructions (Addendum)
Thank you for visiting Dr. [name] at [facility]. Today we recommend the following:  Orders Placed This Encounter  Procedures   Procedural/ Surgical Case Request: CHEST TUBE INSERTION, VIDEO BRONCHOSCOPY WITHOUT FLUORO, VIDEO BRONCHOSCOPY WITH ENDOBRONCHIAL ULTRASOUND   Ambulatory referral to Pulmonology   Return in about 20 days (around 11/06/2022) for with Kandice Robinsons, NP.    Please do your part to reduce the spread of COVID-19.

## 2022-10-17 NOTE — H&P (View-Only) (Signed)
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 [name].  Follow-up CT imaging has revealed adenopathy within the chest as well as a lesion in the left hilum.  Patient was referred here for consideration of bronchoscopy and biopsy.  He is very upset today.  He is dealing a lot with things at home.  He is currently caring for his 21 year old mother.  He is very distraught about this information and is not sure what to do.  I encouraged him for consideration of bronchoscopy and biopsy we can come up with a better plan to move forward he has declined intervention at this time.  He would like to have a PET scan complete.  He states that if he does find out that he has "cancer everywhere" he does not want to do anything with it and would not want to undergo additional treatments at this time.  OV 10/17/2022:Patient here today for follow-up after recent nuclear medicine PET scan that was completed on 09/10/2022.  This reveals hypermetabolic mediastinal and left hilar adenopathy consistent with recurrent of disease.  Also has a moderate left-sided pleural effusion that is increased from his June imaging.  We reviewed his nuclear medicine pet imaging today in the office.  He has fluid accumulation which is concerning for an effusion on that side on the left with a hypermetabolic lung mass concerning for malignant effusion as well as adenopathy.  He is agreeable however at this time to have bronchoscopy and biopsy.      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 [name], laser therapy OD   Seizures (HCC)    pt reports this is from low blood sugar     Family History  Problem Relation Age of Onset   Colon cancer Mother    Hyperlipidemia Father    Hypertension Father    Breast cancer Sister    Coronary artery disease Neg Hx        no early CAD.   Stomach cancer Neg Hx    Rectal cancer Neg Hx    Esophageal cancer Neg Hx      Past Surgical History:  Procedure Laterality Date   COLONOSCOPY     DIRECT LARYNGOSCOPY N/A 02/10/2020   Procedure: DIRECT LARYNGOSCOPY WITH BIOPSY;  Surgeon: Newman Pies, MD;  Location: [facility] OR;  Service: ENT;  Laterality: N/A;   Ear surgery as a child     IR GASTROSTOMY TUBE MOD SED  03/23/2020   IR GASTROSTOMY TUBE REMOVAL  06/01/2020   IR IMAGING GUIDED PORT INSERTION  03/09/2020   IR REMOVAL TUN ACCESS W/ PORT W/O FL MOD SED  02/08/2021   TONSILLECTOMY      Social History  Socioeconomic History   Marital status: Divorced    Spouse name: Not on file   Number of children: 2   Years of education: 81   Highest education level: 12th grade  Occupational History   Occupation: Disabled  Tobacco Use   Smoking status: Never    Passive exposure: Never   Smokeless tobacco: Never   Tobacco comments:    Verified by son, Bronson Vodicka  Vaping Use   Vaping status: Never Used  Substance and Sexual Activity   Alcohol use: Yes    Alcohol/week: 0.0 standard drinks of alcohol    Comment: seldom   Drug use: Yes    Frequency: 7.0 times per week    Comment: marijauna for arthritis, last 03-13-20   Sexual activity: Not Currently    Partners: Female  Other Topics Concern   Not on file  Social History Narrative   Two children.  5 grandchildren.     Social  Determinants of Health   Financial Resource Strain: Low Risk  (04/05/2022)   Overall Financial Resource Strain (CARDIA)    Difficulty of Paying Living Expenses: Not hard at all  Food Insecurity: No Food Insecurity (04/05/2022)   Hunger Vital Sign    Worried About Running Out of Food in the Last Year: Never true    Ran Out of Food in the Last Year: Never true  Transportation Needs: No Transportation Needs (04/05/2022)   PRAPARE - Administrator, Civil Service (Medical): No    Lack of Transportation (Non-Medical): No  Physical Activity: Inactive (04/05/2022)   Exercise Vital Sign    Days of Exercise per Week: 0 days    Minutes of Exercise per Session: 0 min  Stress: No Stress Concern Present (04/05/2022)   [employer] of Occupational Health - Occupational Stress Questionnaire    Feeling of Stress : Not at all  Social Connections: Socially Isolated (04/05/2022)   Social Connection and Isolation Panel [NHANES]    Frequency of Communication with Friends and Family: Twice a week    Frequency of Social Gatherings with Friends and Family: Never    Attends Religious Services: Never    Database administrator or Organizations: No    Attends Banker Meetings: Never    Marital Status: Divorced  Catering manager Violence: Not At Risk (04/05/2022)   Humiliation, Afraid, Rape, and Kick questionnaire    Fear of Current or Ex-Partner: No    Emotionally Abused: No    Physically Abused: No    Sexually Abused: No     Allergies  Allergen Reactions   Codeine Other (See Comments)    Unknown reaction, severe headach   Metformin And Related Diarrhea     Outpatient Medications Prior to Visit  Medication Sig Dispense Refill   apixaban (ELIQUIS) 5 MG TABS tablet Take 1 tablet (5 mg total) by mouth 2 (two) times daily. 60 tablet 5   atorvastatin (LIPITOR) 20 MG tablet Take 1 tablet (20 mg total) by mouth daily. 90 tablet 3   Blood Glucose Monitoring Suppl (ONETOUCH VERIO IQ  SYSTEM) w/Device KIT 1 each by Does not apply route 2 (two) times daily. 1 kit 0   carvedilol (COREG) 25 MG tablet Take 1 tablet (25 mg total) by mouth 2 (two) times daily. 180 tablet 3   Continuous Blood Gluc Sensor (FREESTYLE LIBRE 2 SENSOR) MISC Use as directed to monitor blood glucose continuously. Change sensor Q 14 days. DX E11.65 2 each 11   diltiazem (CARDIZEM  CD) 240 MG 24 hr capsule Take 1 capsule (240 mg total) by mouth daily. 90 capsule 1   doxazosin (CARDURA) 2 MG tablet Take 1 tablet (2 mg total) by mouth daily. 30 tablet 3   ferrous sulfate 325 (65 FE) MG EC tablet Take 325 mg by mouth 3 (three) times daily with meals.     furosemide (LASIX) 40 MG tablet TAKE 1 TABLET (40 MG TOTAL) BY MOUTH AS NEEDED FOR FLUID OR EDEMA. 90 tablet 3   glucose blood test strip 1 each by Other route as needed for other. Use as instructed 100 each 11   hydrALAZINE (APRESOLINE) 50 MG tablet Take 1 tablet (50 mg total) by mouth 3 (three) times daily. 90 tablet 0   losartan (COZAAR) 100 MG tablet Take 1 tablet (100 mg total) by mouth daily. 90 tablet 1   pioglitazone (ACTOS) 30 MG tablet Take 1 tablet (30 mg total) by mouth daily. 90 tablet 0   No facility-administered medications prior to visit.    Review of Systems  Constitutional:  Negative for chills, fever, malaise/fatigue and weight loss.  HENT:  Negative for hearing loss, sore throat and tinnitus.   Eyes:  Negative for blurred vision and double vision.  Respiratory:  Positive for cough and shortness of breath. Negative for hemoptysis, sputum production, wheezing and stridor.   Cardiovascular:  Negative for chest pain, palpitations, orthopnea, leg swelling and PND.  Gastrointestinal:  Negative for abdominal pain, constipation, diarrhea, heartburn, nausea and vomiting.  Genitourinary:  Negative for dysuria, hematuria and urgency.  Musculoskeletal:  Negative for joint pain and myalgias.  Skin:  Negative for itching and rash.  Neurological:   Negative for dizziness, tingling, weakness and headaches.  Endo/Heme/Allergies:  Negative for environmental allergies. Does not bruise/bleed easily.  Psychiatric/Behavioral:  Negative for depression. The patient is not nervous/anxious and does not have insomnia.   All other systems reviewed and are negative.    Objective:  Physical Exam Vitals reviewed.  Constitutional:      General: He is not in acute distress.    Appearance: He is well-developed.  HENT:     Head: Normocephalic and atraumatic.  Eyes:     General: No scleral icterus.    Conjunctiva/sclera: Conjunctivae normal.     Pupils: Pupils are equal, round, and reactive to light.  Neck:     Vascular: No JVD.     Trachea: No tracheal deviation.  Cardiovascular:     Rate and Rhythm: Normal rate and regular rhythm.     Heart sounds: Normal heart sounds. No murmur heard. Pulmonary:     Effort: Pulmonary effort is normal. No tachypnea, accessory muscle usage or respiratory distress.     Breath sounds: No stridor. No wheezing, rhonchi or rales.     Comments: Absent breath sounds in the left base Abdominal:     General: There is no distension.     Palpations: Abdomen is soft.     Tenderness: There is no abdominal tenderness.  Musculoskeletal:        General: No tenderness.     Cervical back: Neck supple.  Lymphadenopathy:     Cervical: No cervical adenopathy.  Skin:    General: Skin is warm and dry.     Capillary Refill: Capillary refill takes less than 2 seconds.     Findings: No rash.  Neurological:     Mental Status: He is alert and oriented to person, place, and time.  Psychiatric:  Behavior: Behavior normal.      Vitals:   10/17/22 1006  BP: (!) 150/120  Pulse: (!) 139  SpO2: 93%  Weight: 175 lb 6.4 oz (79.6 kg)  Height: 5\' 9"  (1.753 m)    93% on RA BMI Readings from Last 3 Encounters:  10/17/22 25.90 kg/m  10/16/22 24.13 kg/m  08/07/22 23.66 kg/m   Wt Readings from Last 3 Encounters:   10/17/22 175 lb 6.4 oz (79.6 kg)  10/16/22 163 lb 6.4 oz (74.1 kg)  08/07/22 160 lb 3.2 oz (72.7 kg)     CBC    Component Value Date/Time   WBC 5.4 10/16/2022 1145   RBC 3.61 (L) 10/16/2022 1145   HGB 10.6 (L) 10/16/2022 1145   HGB 11.9 (L) 09/16/2020 1540   HCT 32.4 (L) 10/16/2022 1145   PLT 218 10/16/2022 1145   PLT 161 09/16/2020 1540   MCV 89.8 10/16/2022 1145   MCH 29.4 10/16/2022 1145   MCHC 32.7 10/16/2022 1145   RDW 14.1 10/16/2022 1145   LYMPHSABS 308 (L) 10/16/2022 1145   MONOABS 0.3 11/09/2021 0726   EOSABS 11 (L) 10/16/2022 1145   BASOSABS 11 10/16/2022 1145     Chest Imaging:  June 2024 CT chest: Left infrahilar adenopathy, mediastinal adenopathy, near occlusion of the left lower lobe bronchus concerning for endobronchial lesion The patient's images have been independently reviewed by me.    July 2024 nuclear medicine pet imaging: Adenopathy within the mediastinum and hilum concerning for malignancy, possible endobronchial lesion hypermetabolic with obstructive collapse Worsening effusion. The patient's images have been independently reviewed by me.    Bedside ultrasound: Free-flowing effusion in the left chest.  Pulmonary Functions Testing Results:    Latest Ref Rng & Units 06/07/2021   12:54 PM  PFT Results  FVC-Pre L 3.95   FVC-Predicted Pre % 90   FVC-Post L 4.03   FVC-Predicted Post % 92   Pre FEV1/FVC % % 83   Post FEV1/FCV % % 83   FEV1-Pre L 3.27   FEV1-Predicted Pre % 100   FEV1-Post L 3.34   DLCO uncorrected ml/min/mmHg 15.35   DLCO UNC% % 59   DLCO corrected ml/min/mmHg 18.26   DLCO COR %Predicted % 71   DLVA Predicted % 75   TLC L 6.09   TLC % Predicted % 89   RV % Predicted % 73     FeNO:   Pathology:   Echocardiogram:   Heart Catheterization:     Assessment & Plan:     ICD-10-CM   1. 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 History of head and neck cancer  Z85.89 Procedural/ Surgical Case Request: CHEST TUBE INSERTION, VIDEO BRONCHOSCOPY WITHOUT FLUORO, VIDEO BRONCHOSCOPY WITH ENDOBRONCHIAL ULTRASOUND    Ambulatory referral to Pulmonology    3. 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 Working on plan for drainage of the pleural cavity to send for cytology followed by bronchoscopy with biopsy and possible  tumor debulking of the left lower lobe. Patient is agreeable to this plan. He is currently on Eliquis that will need to be held. Tentative bronchoscopy date will be September 10. I offered to do his case on September 3 but he does not have transportation. He is going work on trying to get everything arranged for September 10.  Last dose of Eliquis will need to be Saturday before.    Current Outpatient Medications:    apixaban (ELIQUIS) 5 MG TABS tablet, Take 1 tablet (5 mg total) by mouth 2 (two) times daily., Disp: 60 tablet, Rfl: 5   atorvastatin (LIPITOR) 20 MG tablet, Take 1 tablet (20 mg total) by mouth daily., Disp: 90 tablet, Rfl: 3   Blood Glucose Monitoring Suppl (ONETOUCH VERIO IQ SYSTEM) w/Device KIT, 1 each by Does not apply route 2 (two) times daily., Disp: 1 kit, Rfl: 0   carvedilol (COREG) 25 MG tablet, Take 1 tablet (25 mg total) by mouth 2 (two) times daily., Disp: 180 tablet, Rfl: 3   Continuous Blood Gluc Sensor (FREESTYLE LIBRE 2 SENSOR) MISC, Use as directed to monitor blood glucose continuously. Change sensor Q 14 days. DX  E11.65, Disp: 2 each, Rfl: 11   diltiazem (CARDIZEM CD) 240 MG 24 hr capsule, Take 1 capsule (240 mg total) by mouth daily., Disp: 90 capsule, Rfl: 1   doxazosin (CARDURA) 2 MG tablet, Take 1 tablet (2 mg total) by mouth daily., Disp: 30 tablet, Rfl: 3   ferrous sulfate 325 (65 FE) MG EC tablet, Take 325 mg by mouth 3 (three) times daily with meals., Disp: , Rfl:    furosemide (LASIX) 40 MG tablet, TAKE 1 TABLET (40 MG TOTAL) BY MOUTH AS NEEDED FOR FLUID OR EDEMA., Disp: 90 tablet, Rfl: 3   glucose blood test strip, 1 each by Other route as needed for other. Use as instructed, Disp: 100 each, Rfl: 11   hydrALAZINE (APRESOLINE) 50 MG tablet, Take 1 tablet (50 mg total) by mouth 3 (three) times daily., Disp: 90 tablet, Rfl: 0   losartan (COZAAR) 100 MG tablet, Take 1 tablet (100 mg total) by mouth daily., Disp: 90 tablet, Rfl: 1   pioglitazone (ACTOS) 30 MG tablet, Take 1 tablet (30 mg total) by mouth daily., Disp: 90 tablet, Rfl: 0   Josephine Igo, DO [facility] 10/17/2022 10:30 AM

## 2022-10-18 ENCOUNTER — Ambulatory Visit: Payer: Medicare HMO | Admitting: Cardiology

## 2022-10-18 ENCOUNTER — Other Ambulatory Visit: Payer: Self-pay | Admitting: Family Medicine

## 2022-10-18 NOTE — Telephone Encounter (Signed)
Requested medications are due for refill today.  yes  Requested medications are on the active medications list.  yes  Last refill. 12/26/2021 #30 3 rf  Future visit scheduled.   no  Notes to clinic.  Pt is prescribed several medications that appear to have some interaction per refill protocol. Please review for refill.    Requested Prescriptions  Pending Prescriptions Disp Refills   doxazosin (CARDURA) 2 MG tablet [Pharmacy Med Name: DOXAZOSIN MESYLATE 2 MG TAB] 90 tablet 1    Sig: TAKE 1 TABLET BY MOUTH EVERY DAY     Cardiovascular:  Alpha Blockers Failed - 10/18/2022  1:46 AM      Failed - Last BP in normal range    BP Readings from Last 1 Encounters:  10/17/22 (!) 150/120         Failed - Valid encounter within last 6 months    Recent Outpatient Visits           1 year ago Atrial fibrillation, unspecified type (HCC)   [facility], Priscille Heidelberg, MD   1 year ago Atrial fibrillation, unspecified type [facility])   [facility], Priscille Heidelberg, MD   1 year ago Atrial fibrillation, unspecified type [facility])   [facility], Priscille Heidelberg, MD   1 year ago Atrial fibrillation, unspecified type [facility])   [facility], Priscille Heidelberg, MD   1 year ago Atrial fibrillation, unspecified type [facility])   [facility], Priscille Heidelberg, MD

## 2022-10-19 ENCOUNTER — Other Ambulatory Visit: Payer: Self-pay

## 2022-10-19 ENCOUNTER — Inpatient Hospital Stay: Payer: Medicare HMO | Attending: Hematology and Oncology | Admitting: Hematology and Oncology

## 2022-10-19 DIAGNOSIS — N1832 Chronic kidney disease, stage 3b: Secondary | ICD-10-CM

## 2022-10-19 DIAGNOSIS — I6350 Cerebral infarction due to unspecified occlusion or stenosis of unspecified cerebral artery: Secondary | ICD-10-CM

## 2022-10-19 DIAGNOSIS — E1169 Type 2 diabetes mellitus with other specified complication: Secondary | ICD-10-CM

## 2022-10-19 DIAGNOSIS — I480 Paroxysmal atrial fibrillation: Secondary | ICD-10-CM

## 2022-10-19 MED ORDER — EMPAGLIFLOZIN 25 MG PO TABS
25.0000 mg | ORAL_TABLET | Freq: Every day | ORAL | 1 refills | Status: DC
Start: 2022-10-19 — End: 2023-03-07

## 2022-10-26 ENCOUNTER — Telehealth: Payer: Self-pay

## 2022-10-26 ENCOUNTER — Ambulatory Visit: Payer: Medicare HMO | Attending: Radiation Oncology | Admitting: Radiation Oncology

## 2022-10-26 NOTE — Telephone Encounter (Signed)
RN left message for pt after pt did not show up for his 2pm follow up appointment. RN did notice on recent notes that pt had followed up with Dr. [name] for new pet scan results. Rn informed Dr. [name] of this and Dr. [name] will look at these results.

## 2022-10-26 NOTE — Telephone Encounter (Signed)
RN left second message for pt in attempt to reach him after not coming to 2pm appointment.

## 2022-10-29 ENCOUNTER — Telehealth: Payer: Self-pay | Admitting: Radiation Oncology

## 2022-10-29 ENCOUNTER — Telehealth: Payer: Self-pay | Admitting: Pulmonary Disease

## 2022-10-29 ENCOUNTER — Encounter (HOSPITAL_COMMUNITY): Payer: Self-pay | Admitting: Pulmonary Disease

## 2022-10-29 NOTE — Anesthesia Preprocedure Evaluation (Signed)
Anesthesia Evaluation  Patient identified by MRN, date of birth, ID band Patient awake    Reviewed: Allergy & Precautions, H&P , NPO status , Patient's Chart, lab work & pertinent test results  Airway Mallampati: III  TM Distance: >3 FB Neck ROM: Full    Dental no notable dental hx. (+) Poor Dentition, Dental Advisory Given   Pulmonary neg pulmonary ROS, Patient abstained from smoking.   Pulmonary exam normal breath sounds clear to auscultation       Cardiovascular hypertension, Pt. on medications and Pt. on home beta blockers + dysrhythmias Atrial Fibrillation  Rhythm:Regular Rate:Normal     Neuro/Psych CVA, Residual Symptoms  negative psych ROS   GI/Hepatic negative GI ROS, Neg liver ROS,,,  Endo/Other  diabetes, Type 2, Oral Hypoglycemic Agents    Renal/GU negative Renal ROS  negative genitourinary   Musculoskeletal  (+) Arthritis , Osteoarthritis,    Abdominal   Peds  Hematology  (+) Blood dyscrasia, anemia   Anesthesia Other Findings   Reproductive/Obstetrics negative OB ROS                             Anesthesia Physical Anesthesia Plan  ASA: 3  Anesthesia Plan: General   Post-op Pain Management: Tylenol PO (pre-op)*   Induction: Intravenous  PONV Risk Score and Plan: 3 and Ondansetron and Dexamethasone  Airway Management Planned: Oral ETT  Additional Equipment:   Intra-op Plan:   Post-operative Plan: Extubation in OR  Informed Consent: I have reviewed the patients History and Physical, chart, labs and discussed the procedure including the risks, benefits and alternatives for the proposed anesthesia with the patient or authorized representative who has indicated his/her understanding and acceptance.     Dental advisory given  Plan Discussed with: CRNA  Anesthesia Plan Comments: (PAT note by Antionette Poles, PA-C:  Follows with cardiology for history of HTN, atrial  fibrillation on Eliquis, and cardiomyopathy.  EF previously 25 to 30% in 2011, he had a CVA treated with tPA at that time.  Most recent echo 10/2021 shows EF 55 to 60%, no significant valvular abnormalities.  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  Recent follow-up CT imaging revealed adenopathy within the chest as well as a lesion of the left hilum.  He was referred to pulmonology for consideration of bronchoscopy and biopsy.  PET scan 09/10/2022 showed hypermetabolic mediastinal and left hilar adenopathy consistent with recurrence of disease.  Also noted to have a moderate left-sided pleural effusion that has increased from June imaging.  History of CKD 3B.  Non-insulin-dependent diabetes.  CMP and CBC from 10/16/2022 reviewed, creatinine stable at 1.69, mild hypokalemia potassium 3.4, mild anemia hemoglobin 10.6, DM2 well-controlled with A1c 6.3.  BNP was mildly elevated at 309, at that time he was seen by his PCP Dr. Tanya Nones who advised patient to take a dose of Lasix as he was fluid overloaded on exam.  Patient was also tachycardic at that time due to having run out of carvedilol, this was refilled and he was encouraged to restart immediately.  Patient will need day of surgery evaluation.  EKG 04/16/2022: Atrial fibrillation.  Ventricular rate 61.  Incomplete right bundle-branch block.  Right ventricular hypertrophy with repolarization abnormality.  TTE 11/08/2021: 1. Left ventricular ejection fraction, by estimation, is 55 to 60%. The  left ventricle has normal function. The left ventricle has no regional  wall motion abnormalities. There is  moderate concentric left ventricular  hypertrophy. Left ventricular  diastolic function could not be evaluated.  2. Right ventricular systolic function is normal. The right ventricular  size is normal. Tricuspid regurgitation signal is inadequate for assessing  PA pressure.  3. Left atrial size was  moderately dilated.  4. Right atrial size was severely dilated.  5. The mitral valve is abnormal. Mild mitral valve regurgitation. No  evidence of mitral stenosis.  6. The aortic valve is tricuspid. There is mild calcification of the  aortic valve. There is moderate thickening of the aortic valve. Aortic  valve regurgitation is not visualized. Aortic valve  sclerosis/calcification is present, without any evidence of  aortic stenosis.  7. Aortic dilatation noted. There is borderline dilatation of the  ascending aorta, measuring 39 mm.  8. The inferior vena cava is normal in size with greater than 50%  respiratory variability, suggesting right atrial pressure of 3 mmHg.     )        Anesthesia Quick Evaluation

## 2022-10-29 NOTE — Progress Notes (Signed)
Anesthesia Chart Review:  Follows with cardiology for history of HTN, atrial fibrillation on Eliquis, and cardiomyopathy.  EF previously 25 to 30% in 2011, he had a CVA treated with tPA at that time.  Most recent echo 10/2021 shows EF 55 to 60%, no significant valvular abnormalities.  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  Recent follow-up CT imaging revealed adenopathy within the chest as well as a lesion of the left hilum.  He was referred to pulmonology for consideration of bronchoscopy and biopsy.  PET scan 09/10/2022 showed hypermetabolic mediastinal and left hilar adenopathy consistent with recurrence of disease.  Also noted to have a moderate left-sided pleural effusion that has increased from June imaging.  History of CKD 3B.  Non-insulin-dependent diabetes.  CMP and CBC from 10/16/2022 reviewed, creatinine stable at 1.69, mild hypokalemia potassium 3.4, mild anemia hemoglobin 10.6, DM2 well-controlled with A1c 6.3.  BNP was mildly elevated at 309, at that time he was seen by his PCP Dr. Tanya Nones who advised patient to take a dose of Lasix as he was fluid overloaded on exam.  Patient was also tachycardic at that time due to having run out of carvedilol, this was refilled and he was encouraged to restart immediately.  Patient will need day of surgery evaluation.  EKG 04/16/2022: Atrial fibrillation.  Ventricular rate 61.  Incomplete right bundle-branch block.  Right ventricular hypertrophy with repolarization abnormality.  TTE 11/08/2021: 1. Left ventricular ejection fraction, by estimation, is 55 to 60%. The  left ventricle has normal function. The left ventricle has no regional  wall motion abnormalities. There is moderate concentric left ventricular  hypertrophy. Left ventricular  diastolic function could not be evaluated.   2. Right ventricular systolic function is normal. The right ventricular  size is normal. Tricuspid  regurgitation signal is inadequate for assessing  PA pressure.   3. Left atrial size was moderately dilated.   4. Right atrial size was severely dilated.   5. The mitral valve is abnormal. Mild mitral valve regurgitation. No  evidence of mitral stenosis.   6. The aortic valve is tricuspid. There is mild calcification of the  aortic valve. There is moderate thickening of the aortic valve. Aortic  valve regurgitation is not visualized. Aortic valve  sclerosis/calcification is present, without any evidence of  aortic stenosis.   7. Aortic dilatation noted. There is borderline dilatation of the  ascending aorta, measuring 39 mm.   8. The inferior vena cava is normal in size with greater than 50%  respiratory variability, suggesting right atrial pressure of 3 mmHg.      Zannie Cove [facility] Short Stay Center/Anesthesiology Phone [phone] 10/29/2022 8:54 AM

## 2022-10-29 NOTE — Progress Notes (Addendum)
SDW call  Patient's son Brian [name] was given pre-op instructions over the phone. He verbalized understanding of instructions provided.     PCP - Dr. Lynnea Ferrier Cardiologist - Dr. Rollene Rotunda Pulmonary:    PPM/ICD - denies Device Orders - n/a Rep Notified - n/a   Chest x-ray - 9/15/20223 EKG -  04/16/2022 Stress Test - 02/28/2010 ECHO - 11/08/2021 Cardiac Cath -   Sleep Study/sleep apnea/CPAP:  Diagnosed with sleep apnea, does not wear his CPAP  Type II diabetic Fasting Blood sugar range: unknown How often check sugars: very rarely Jardiance, states last dose 10/29/2022. Instructed to hold the day of surgery Actos, instructed to hold the day of surgery   Blood Thinner Instructions: Eliquis, states last dose 10/25/2022 Aspirin Instructions:denies   ERAS Protcol - NPO   COVID TEST- n/a    Anesthesia review: Yes.  HTN, DM, strorke, seizures, A-fib   Your procedure is scheduled on Tuesday, October 30, 2022  Report to [facility] Main Entrance "A" at  0630  A.M., then check in with the Admitting office.  Call this number if you have problems the morning of surgery:  [phone]   If you have any questions prior to your surgery date call [phone]: Open Monday-Friday 8am-4pm If you experience any cold or flu symptoms such as cough, fever, chills, shortness of breath, etc. between now and your scheduled surgery, please notify us at the above number     Remember:  Do not eat or drink after midnight the night before your surgery  Take these medicines the morning of surgery with A SIP OF WATER:  Atorvastatin, carvedilol, diltiazem, doxazosin, hydralazine  As of today, STOP taking any Aspirin (unless otherwise instructed by your surgeon) Aleve, Naproxen, Ibuprofen, Motrin, Advil, Goody's, BC's, all herbal medications, fish oil, and all vitamins.

## 2022-10-29 NOTE — Telephone Encounter (Signed)
9/9 @ 8:57 am called and spoke to patient to get him reschedule for his missed appointment for FU20 consult from 9/6.  Patient stated he was told by his physician to cancel all his appointments till after his biopsy is done at [facility] then he will reach to [facility] to be reschedule.

## 2022-10-29 NOTE — Telephone Encounter (Signed)
Would you be able to assist with this? I see your note where you spoke with the son, however there seems to be some confusion as that states patient hasn't had Jardiance since 10/25/22 but patient reports he has still been taking this. Thank you!

## 2022-10-29 NOTE — Telephone Encounter (Signed)
PT has a Herbalist. States he has stopped Eliquis but not the JARDIANCE because "He did not know."   Please call to advise @ [phone]

## 2022-10-29 NOTE — Telephone Encounter (Signed)
PT has a Herbalist and needs inst on what meds to get off of and what to take.  Off of Eliquis  Still taking Jaurdice  His # [phone]

## 2022-10-30 ENCOUNTER — Ambulatory Visit: Payer: Medicare HMO | Admitting: Family Medicine

## 2022-10-30 ENCOUNTER — Encounter (HOSPITAL_COMMUNITY): Payer: Self-pay | Admitting: Pulmonary Disease

## 2022-10-30 ENCOUNTER — Ambulatory Visit (HOSPITAL_COMMUNITY)
Admission: RE | Admit: 2022-10-30 | Discharge: 2022-10-30 | Disposition: A | Discharge status: Home / Self Care | Payer: Medicare HMO | Attending: Pulmonary Disease | Admitting: Pulmonary Disease

## 2022-10-30 ENCOUNTER — Ambulatory Visit (HOSPITAL_COMMUNITY): Payer: Medicare HMO

## 2022-10-30 ENCOUNTER — Encounter (HOSPITAL_COMMUNITY)
Admission: RE | Disposition: A | Discharge status: Home / Self Care | Payer: Self-pay | Source: Home / Self Care | Attending: Pulmonary Disease

## 2022-10-30 ENCOUNTER — Ambulatory Visit (HOSPITAL_BASED_OUTPATIENT_CLINIC_OR_DEPARTMENT_OTHER): Payer: Medicare HMO | Admitting: Physician Assistant

## 2022-10-30 ENCOUNTER — Ambulatory Visit (HOSPITAL_COMMUNITY): Payer: Medicare HMO | Admitting: Physician Assistant

## 2022-10-30 ENCOUNTER — Other Ambulatory Visit: Payer: Self-pay

## 2022-10-30 DIAGNOSIS — I4891 Unspecified atrial fibrillation: Secondary | ICD-10-CM | POA: Diagnosis not present

## 2022-10-30 DIAGNOSIS — Z9221 Personal history of antineoplastic chemotherapy: Secondary | ICD-10-CM | POA: Insufficient documentation

## 2022-10-30 DIAGNOSIS — Z7901 Long term (current) use of anticoagulants: Secondary | ICD-10-CM | POA: Diagnosis not present

## 2022-10-30 DIAGNOSIS — N1832 Chronic kidney disease, stage 3b: Secondary | ICD-10-CM | POA: Diagnosis not present

## 2022-10-30 DIAGNOSIS — J948 Other specified pleural conditions: Secondary | ICD-10-CM | POA: Diagnosis not present

## 2022-10-30 DIAGNOSIS — Z79899 Other long term (current) drug therapy: Secondary | ICD-10-CM | POA: Insufficient documentation

## 2022-10-30 DIAGNOSIS — N189 Chronic kidney disease, unspecified: Secondary | ICD-10-CM | POA: Diagnosis not present

## 2022-10-30 DIAGNOSIS — C771 Secondary and unspecified malignant neoplasm of intrathoracic lymph nodes: Secondary | ICD-10-CM | POA: Diagnosis not present

## 2022-10-30 DIAGNOSIS — E1122 Type 2 diabetes mellitus with diabetic chronic kidney disease: Secondary | ICD-10-CM | POA: Diagnosis not present

## 2022-10-30 DIAGNOSIS — I129 Hypertensive chronic kidney disease with stage 1 through stage 4 chronic kidney disease, or unspecified chronic kidney disease: Secondary | ICD-10-CM | POA: Insufficient documentation

## 2022-10-30 DIAGNOSIS — C3432 Malignant neoplasm of lower lobe, left bronchus or lung: Secondary | ICD-10-CM | POA: Diagnosis present

## 2022-10-30 DIAGNOSIS — Z8589 Personal history of malignant neoplasm of other organs and systems: Secondary | ICD-10-CM

## 2022-10-30 DIAGNOSIS — I130 Hypertensive heart and chronic kidney disease with heart failure and stage 1 through stage 4 chronic kidney disease, or unspecified chronic kidney disease: Secondary | ICD-10-CM

## 2022-10-30 DIAGNOSIS — C801 Malignant (primary) neoplasm, unspecified: Secondary | ICD-10-CM | POA: Diagnosis not present

## 2022-10-30 DIAGNOSIS — I429 Cardiomyopathy, unspecified: Secondary | ICD-10-CM | POA: Insufficient documentation

## 2022-10-30 DIAGNOSIS — Z7984 Long term (current) use of oral hypoglycemic drugs: Secondary | ICD-10-CM | POA: Diagnosis not present

## 2022-10-30 DIAGNOSIS — C3490 Malignant neoplasm of unspecified part of unspecified bronchus or lung: Secondary | ICD-10-CM | POA: Diagnosis not present

## 2022-10-30 DIAGNOSIS — I5020 Unspecified systolic (congestive) heart failure: Secondary | ICD-10-CM | POA: Diagnosis not present

## 2022-10-30 DIAGNOSIS — Z8673 Personal history of transient ischemic attack (TIA), and cerebral infarction without residual deficits: Secondary | ICD-10-CM | POA: Insufficient documentation

## 2022-10-30 DIAGNOSIS — R599 Enlarged lymph nodes, unspecified: Secondary | ICD-10-CM | POA: Diagnosis not present

## 2022-10-30 DIAGNOSIS — R918 Other nonspecific abnormal finding of lung field: Secondary | ICD-10-CM | POA: Diagnosis not present

## 2022-10-30 DIAGNOSIS — Z85810 Personal history of malignant neoplasm of tongue: Secondary | ICD-10-CM | POA: Diagnosis not present

## 2022-10-30 DIAGNOSIS — J984 Other disorders of lung: Secondary | ICD-10-CM | POA: Diagnosis not present

## 2022-10-30 DIAGNOSIS — R590 Localized enlarged lymph nodes: Secondary | ICD-10-CM

## 2022-10-30 DIAGNOSIS — J90 Pleural effusion, not elsewhere classified: Secondary | ICD-10-CM | POA: Diagnosis not present

## 2022-10-30 DIAGNOSIS — Z48813 Encounter for surgical aftercare following surgery on the respiratory system: Secondary | ICD-10-CM | POA: Diagnosis not present

## 2022-10-30 DIAGNOSIS — Z923 Personal history of irradiation: Secondary | ICD-10-CM | POA: Diagnosis not present

## 2022-10-30 DIAGNOSIS — I5042 Chronic combined systolic (congestive) and diastolic (congestive) heart failure: Secondary | ICD-10-CM | POA: Diagnosis not present

## 2022-10-30 DIAGNOSIS — R846 Abnormal cytological findings in specimens from respiratory organs and thorax: Secondary | ICD-10-CM | POA: Diagnosis not present

## 2022-10-30 HISTORY — PX: THORACENTESIS: SHX235

## 2022-10-30 HISTORY — PX: VIDEO BRONCHOSCOPY WITH ENDOBRONCHIAL ULTRASOUND: SHX6177

## 2022-10-30 HISTORY — PX: BRONCHIAL NEEDLE ASPIRATION BIOPSY: SHX5106

## 2022-10-30 HISTORY — PX: VIDEO BRONCHOSCOPY: SHX5072

## 2022-10-30 LAB — GLUCOSE, CAPILLARY
Glucose-Capillary: 126 mg/dL — ABNORMAL HIGH (ref 70–99)
Glucose-Capillary: 93 mg/dL (ref 70–99)

## 2022-10-30 SURGERY — VIDEO BRONCHOSCOPY WITHOUT FLUORO
Anesthesia: General | Laterality: Left

## 2022-10-30 MED ORDER — LACTATED RINGERS IV SOLN: INTRAVENOUS | Status: DC | PRN

## 2022-10-30 MED ORDER — SUCCINYLCHOLINE CHLORIDE 200 MG/10ML IV SOSY
PREFILLED_SYRINGE | INTRAVENOUS | Status: DC | PRN
Start: 2022-10-30 — End: 2022-10-30
  Administered 2022-10-30: 80 mg via INTRAVENOUS

## 2022-10-30 MED ORDER — ONDANSETRON HCL 4 MG/2ML IJ SOLN
INTRAMUSCULAR | Status: DC | PRN
  Administered 2022-10-30: 4 mg via INTRAVENOUS

## 2022-10-30 MED ORDER — CARVEDILOL 12.5 MG PO TABS
25.0000 mg | ORAL_TABLET | Freq: Two times a day (BID) | ORAL | Status: DC
  Administered 2022-10-30: 25 mg via ORAL
  Filled 2022-10-30: qty 2

## 2022-10-30 MED ORDER — CHLORHEXIDINE GLUCONATE 0.12 % MT SOLN
15.0000 mL | Freq: Once | OROMUCOSAL | Status: AC
  Administered 2022-10-30: 15 mL via OROMUCOSAL
  Filled 2022-10-30: qty 15

## 2022-10-30 MED ORDER — FENTANYL CITRATE (PF) 100 MCG/2ML IJ SOLN: 25.0000 ug | INTRAMUSCULAR | Status: DC | PRN

## 2022-10-30 MED ORDER — INSULIN ASPART 100 UNIT/ML IJ SOLN: 0.0000 [IU] | INTRAMUSCULAR | Status: DC | PRN

## 2022-10-30 MED ORDER — ACETAMINOPHEN 500 MG PO TABS
1000.0000 mg | ORAL_TABLET | Freq: Once | ORAL | Status: AC
  Administered 2022-10-30: 1000 mg via ORAL
  Filled 2022-10-30: qty 2

## 2022-10-30 MED ORDER — PROPOFOL 10 MG/ML IV BOLUS
INTRAVENOUS | Status: DC | PRN
  Administered 2022-10-30: 70 mg via INTRAVENOUS

## 2022-10-30 MED ORDER — LIDOCAINE 2% (20 MG/ML) 5 ML SYRINGE
INTRAMUSCULAR | Status: DC | PRN
  Administered 2022-10-30: 60 mg via INTRAVENOUS

## 2022-10-30 MED ORDER — SUGAMMADEX SODIUM 200 MG/2ML IV SOLN
INTRAVENOUS | Status: DC | PRN
  Administered 2022-10-30: 200 mg via INTRAVENOUS

## 2022-10-30 MED ORDER — LACTATED RINGERS IV SOLN: INTRAVENOUS | Status: DC

## 2022-10-30 MED ORDER — ROCURONIUM BROMIDE 10 MG/ML (PF) SYRINGE
PREFILLED_SYRINGE | INTRAVENOUS | Status: DC | PRN
  Administered 2022-10-30: 40 mg via INTRAVENOUS

## 2022-10-30 MED ORDER — PROPOFOL 500 MG/50ML IV EMUL
INTRAVENOUS | Status: DC | PRN
Start: 2022-10-30 — End: 2022-10-30
  Administered 2022-10-30: 100 ug/kg/min via INTRAVENOUS

## 2022-10-30 SURGICAL SUPPLY — 29 items
BRUSH CYTOL CELLEBRITY 1.5X140 (MISCELLANEOUS) IMPLANT
CANISTER SUCT 3000ML PPV (MISCELLANEOUS) ×3 IMPLANT
CONT SPEC 4OZ CLIKSEAL STRL BL (MISCELLANEOUS) ×3 IMPLANT
COVER BACK TABLE 60X90IN (DRAPES) ×3 IMPLANT
COVER DOME SNAP 22 D (MISCELLANEOUS) ×3 IMPLANT
FORCEPS BIOP RJ4 1.8 (CUTTING FORCEPS) IMPLANT
GAUZE SPONGE 4X4 12PLY STRL (GAUZE/BANDAGES/DRESSINGS) ×3 IMPLANT
GLOVE BIO SURGEON STRL SZ7.5 (GLOVE) ×3 IMPLANT
GOWN STRL REUS W/ TWL LRG LVL3 (GOWN DISPOSABLE) ×3 IMPLANT
GOWN STRL REUS W/TWL LRG LVL3 (GOWN DISPOSABLE) ×3
KIT CLEAN ENDO COMPLIANCE (KITS) ×6 IMPLANT
KIT TURNOVER KIT B (KITS) ×3 IMPLANT
MARKER SKIN DUAL TIP RULER LAB (MISCELLANEOUS) ×3 IMPLANT
NDL EBUS SONO TIP PENTAX (NEEDLE) ×3 IMPLANT
NEEDLE EBUS SONO TIP PENTAX (NEEDLE) ×3 IMPLANT
NS IRRIG 1000ML POUR BTL (IV SOLUTION) ×3 IMPLANT
OIL SILICONE PENTAX (PARTS (SERVICE/REPAIRS)) ×3 IMPLANT
PAD ARMBOARD 7.5X6 YLW CONV (MISCELLANEOUS) ×6 IMPLANT
SOL ANTI FOG 6CC (MISCELLANEOUS) ×3 IMPLANT
SYR 20CC LL (SYRINGE) ×6 IMPLANT
SYR 20ML ECCENTRIC (SYRINGE) ×6 IMPLANT
SYR 50ML SLIP (SYRINGE) IMPLANT
SYR 5ML LUER SLIP (SYRINGE) ×3 IMPLANT
TOWEL OR 17X24 6PK STRL BLUE (TOWEL DISPOSABLE) ×3 IMPLANT
TRAP SPECIMEN MUCOUS 40CC (MISCELLANEOUS) IMPLANT
TUBE CONNECTING 20X1/4 (TUBING) ×6 IMPLANT
UNDERPAD 30X30 (UNDERPADS AND DIAPERS) ×3 IMPLANT
VALVE DISPOSABLE (MISCELLANEOUS) ×3 IMPLANT
WATER STERILE IRR 1000ML POUR (IV SOLUTION) ×3 IMPLANT

## 2022-10-30 NOTE — Transfer of Care (Signed)
Immediate Anesthesia Transfer of Care Note  Patient: Brian [name]  Procedure(s) Performed: VIDEO BRONCHOSCOPY WITHOUT FLUORO (Left) VIDEO BRONCHOSCOPY WITH ENDOBRONCHIAL ULTRASOUND (Bilateral) THORACENTESIS BRONCHIAL NEEDLE ASPIRATION BIOPSIES  Patient Location: PACU  Anesthesia Type:General  Level of Consciousness: awake, alert , patient cooperative, and responds to stimulation  Airway & Oxygen Therapy: Patient Spontanous Breathing and Patient connected to face mask oxygen  Post-op Assessment: Report given to RN and Post -op Vital signs reviewed and stable  Post vital signs: Reviewed and stable  Last Vitals:  Vitals Value Taken Time  BP 107/68 10/30/22 1017  Temp 36.4 C 10/30/22 1017  Pulse 68 10/30/22 1019  Resp 13 10/30/22 1019  SpO2 96 % 10/30/22 1019  Vitals shown include unfiled device data.  Last Pain:  Vitals:   10/30/22 1017  TempSrc:   PainSc: Asleep         Complications:  Encounter Notable Events  Notable Event Outcome Phase Comment  Difficult to intubate - expected  Intraprocedure Filed from anesthesia note documentation.

## 2022-10-30 NOTE — Op Note (Signed)
Video Bronchoscopy with Endobronchial Ultrasound Procedure Note  Date of Operation: 10/30/2022  Pre-op Diagnosis: Adenopathy   Post-op Diagnosis: Adenopathy   Surgeon: Josephine Igo, DO  Assistants: None   Anesthesia: General endotracheal anesthesia  Operation: Flexible video fiberoptic bronchoscopy with endobronchial ultrasound and biopsies.  Estimated Blood Loss: Minimal  Complications: None   Indications and History: Brian [name] is a 67 y.o. male with left lung mass, adenopathy .  The risks, benefits, complications, treatment options and expected outcomes were discussed with the patient.  The possibilities of pneumothorax, pneumonia, reaction to medication, pulmonary aspiration, perforation of a viscus, bleeding, failure to diagnose a condition and creating a complication requiring transfusion or operation were discussed with the patient who freely signed the consent.    Description of Procedure: The patient was examined in the preoperative area and history and data from the preprocedure consultation were reviewed. It was deemed appropriate to proceed.  The patient was taken to [facility] end 3, identified as [name] and the procedure verified as Flexible Video Fiberoptic Bronchoscopy.  A Time Out was held and the above information confirmed. After being taken to the operating room general anesthesia was initiated and the patient  was orally intubated. The video fiberoptic bronchoscope was introduced via the endotracheal tube and a general inspection was performed which showed extrinsic compression of the LLL bronchus. The standard scope was then withdrawn and the endobronchial ultrasound was used to identify and characterize the peritracheal, hilar and bronchial lymph nodes. Inspection showed Enlarged station 4R and 4L, and visible LLL mass. Using real-time ultrasound guidance Wang needle biopsies were take from Station 4R, 4L and LLL mass and were sent for cytology. The patient  tolerated the procedure well without apparent complications. There was no significant blood loss. The bronchoscope was withdrawn. Anesthesia was reversed and the patient was taken to the PACU for recovery.   Samples: 1. Wang needle biopsies from 4R node 2. Wang needle biopsies from 4L node 3. Wang needle biopsies from LLL mass  Plans:  The patient will be discharged from the PACU to home when recovered from anesthesia. We will review the cytology, pathology results with the patient when they become available. Outpatient followup will be with Josephine Igo, DO.    Josephine Igo, DO [facility] 10/30/2022 10:11 AM

## 2022-10-30 NOTE — Interval H&P Note (Signed)
History and Physical Interval Note:  10/30/2022 8:53 AM  Brian [name]  has presented today for surgery, with the diagnosis of lung mass, effusino.  The various methods of treatment have been discussed with the patient and family. After consideration of risks, benefits and other options for treatment, the patient has consented to  Procedure(s): VIDEO BRONCHOSCOPY WITHOUT FLUORO (Left) VIDEO BRONCHOSCOPY WITH ENDOBRONCHIAL ULTRASOUND (Bilateral) CHEST TUBE INSERTION (Left) as a surgical intervention.  The patient's history has been reviewed, patient examined, no change in status, stable for surgery.  I have reviewed the patient's chart and labs.  Questions were answered to the patient's satisfaction.     Rachel Bo Lynk Marti

## 2022-10-30 NOTE — Discharge Instructions (Addendum)
Flexible Bronchoscopy, Care After This sheet gives you information about how to care for yourself after your test. Your doctor may also give you more specific instructions. If you have problems or questions, contact your doctor. Follow these instructions at home: Eating and drinking Do not eat or drink anything (not even water) for 2 hours after your test, or until your numbing medicine (local anesthetic) wears off. When your numbness is gone and your cough and gag reflexes have come back, you may: Eat only soft foods. Slowly drink liquids. The day after the test, go back to your normal diet. Driving Do not drive for 24 hours if you were given a medicine to help you relax (sedative). Do not drive or use heavy machinery while taking prescription pain medicine. General instructions  Take over-the-counter and prescription medicines only as told by your doctor. Return to your normal activities as told. Ask what activities are safe for you. Do not use any products that have nicotine or tobacco in them. This includes cigarettes and e-cigarettes. If you need help quitting, ask your doctor. Keep all follow-up visits as told by your doctor. This is important. It is very important if you had a tissue sample (biopsy) taken. Get help right away if: You have shortness of breath that gets worse. You get light-headed. You feel like you are going to pass out (faint). You have chest pain. You cough up: More than a little blood. More blood than before. Summary Do not eat or drink anything (not even water) for 2 hours after your test, or until your numbing medicine wears off. Do not use cigarettes. Do not use e-cigarettes. Get help right away if you have chest pain.  This information is not intended to replace advice given to you by your health care provider. Make sure you discuss any questions you have with your health care provider. Document Released: 12/03/2008 Document Revised: 01/18/2017 Document  Reviewed: 02/24/2016 Elsevier Patient Education  2020 [email or web address]. f

## 2022-10-30 NOTE — Anesthesia Procedure Notes (Signed)
Procedure Name: Intubation Date/Time: 10/30/2022 9:20 AM  Performed by: Margarita Rana, CRNAPre-anesthesia Checklist: Patient identified, Patient being monitored, Timeout performed, Emergency Drugs available and Suction available Patient Re-evaluated:Patient Re-evaluated prior to induction Oxygen Delivery Method: Circle System Utilized Preoxygenation: Pre-oxygenation with 100% oxygen Induction Type: IV induction Ventilation: Two handed mask ventilation required Laryngoscope Size: Glidescope and 4 Grade View: Grade I Tube type: Oral Tube size: 8.5 mm Number of attempts: 1 Airway Equipment and Method: Stylet Placement Confirmation: ETT inserted through vocal cords under direct vision, positive ETCO2 and breath sounds checked- equal and bilateral Secured at: 21 cm Tube secured with: Tape Dental Injury: Teeth and Oropharynx as per pre-operative assessment  Difficulty Due To: Difficulty was anticipated Future Recommendations: Recommend- induction with short-acting agent, and alternative techniques readily available

## 2022-10-30 NOTE — Progress Notes (Signed)
Patient appropriate for discharge, placed in phase 2, awaiting CXR results before discharge.

## 2022-10-30 NOTE — Anesthesia Postprocedure Evaluation (Signed)
Anesthesia Post Note  Patient: Brian [name]  Procedure(s) Performed: VIDEO BRONCHOSCOPY WITHOUT FLUORO (Left) VIDEO BRONCHOSCOPY WITH ENDOBRONCHIAL ULTRASOUND (Bilateral) THORACENTESIS BRONCHIAL NEEDLE ASPIRATION BIOPSIES     Patient location during evaluation: PACU Anesthesia Type: General Level of consciousness: awake and alert Pain management: pain level controlled Vital Signs Assessment: post-procedure vital signs reviewed and stable Respiratory status: spontaneous breathing, nonlabored ventilation and respiratory function stable Cardiovascular status: blood pressure returned to baseline and stable Postop Assessment: no apparent nausea or vomiting Anesthetic complications: yes  Encounter Notable Events  Notable Event Outcome Phase Comment  Difficult to intubate - expected  Intraprocedure Filed from anesthesia note documentation.    Last Vitals:  Vitals:   10/30/22 1032 10/30/22 1047  BP: 123/75 137/89  Pulse: 72 70  Resp: (!) 22 (!) 22  Temp:  (!) 36.4 C  SpO2: 95% 97%    Last Pain:  Vitals:   10/30/22 1017  TempSrc:   PainSc: Asleep                 Rachid Parham,W. EDMOND

## 2022-10-30 NOTE — Op Note (Addendum)
Thoracentesis  Procedure Note  SHAWNDEL SHADWELL  [id number]  1955/09/10  Date:10/30/22  Time:10:02 AM   Provider Performing:Deyton Ellenbecker L Skyanne Welle   Procedure: Thoracentesis with imaging guidance (13244)  Indication(s) Pleural Effusion  Consent Risks of the procedure as well as the alternatives and risks of each were explained to the patient and/or caregiver.  Consent for the procedure was obtained and is signed in the bedside chart  Anesthesia Topical only with 1% lidocaine    Time Out Verified patient identification, verified procedure, site/side was marked, verified correct patient position, special equipment/implants available, medications/allergies/relevant history reviewed, required imaging and test results available.   Sterile Technique Maximal sterile technique including full sterile barrier drape, hand hygiene, sterile gown, sterile gloves, mask, hair covering, sterile ultrasound probe cover (if used).  Procedure Description Ultrasound was used to identify appropriate pleural anatomy for placement and overlying skin marked.  Area of drainage cleaned and draped in sterile fashion. Lidocaine was used to anesthetize the skin and subcutaneous tissue.  1300 cc's of amber appearing fluid was drained from the left pleural space. Catheter then removed and bandaid applied to site.   Complications/Tolerance None; patient tolerated the procedure well. Chest X-ray is ordered to confirm no post-procedural complication.   EBL Minimal   Specimen(s) Pleural fluid  Josephine Igo, DO [facility] 10/30/2022 10:03 AM      Bedside US was used to evaluate the presence of sliding lung along the anterior chest prior to proceeding with bronchoscopy.

## 2022-10-31 LAB — CYTOLOGY - NON PAP

## 2022-11-04 ENCOUNTER — Encounter (HOSPITAL_COMMUNITY): Payer: Self-pay | Admitting: Pulmonary Disease

## 2022-11-04 DIAGNOSIS — C3490 Malignant neoplasm of unspecified part of unspecified bronchus or lung: Secondary | ICD-10-CM | POA: Diagnosis not present

## 2022-11-06 ENCOUNTER — Encounter: Payer: Self-pay | Admitting: Acute Care

## 2022-11-06 ENCOUNTER — Ambulatory Visit (INDEPENDENT_AMBULATORY_CARE_PROVIDER_SITE_OTHER): Payer: Medicare HMO | Admitting: Acute Care

## 2022-11-06 ENCOUNTER — Telehealth: Payer: Self-pay | Admitting: Hematology and Oncology

## 2022-11-06 ENCOUNTER — Ambulatory Visit (INDEPENDENT_AMBULATORY_CARE_PROVIDER_SITE_OTHER): Payer: Medicare HMO | Admitting: Family Medicine

## 2022-11-06 ENCOUNTER — Encounter: Payer: Self-pay | Admitting: Family Medicine

## 2022-11-06 VITALS — BP 126/86 | HR 107 | Temp 98.3°F | Ht 69.0 in | Wt 169.0 lb

## 2022-11-06 VITALS — BP 142/60 | HR 81 | Temp 98.1°F | Ht 69.0 in | Wt 169.2 lb

## 2022-11-06 DIAGNOSIS — I509 Heart failure, unspecified: Secondary | ICD-10-CM

## 2022-11-06 DIAGNOSIS — Z23 Encounter for immunization: Secondary | ICD-10-CM

## 2022-11-06 DIAGNOSIS — N1832 Chronic kidney disease, stage 3b: Secondary | ICD-10-CM

## 2022-11-06 DIAGNOSIS — C3490 Malignant neoplasm of unspecified part of unspecified bronchus or lung: Secondary | ICD-10-CM | POA: Diagnosis not present

## 2022-11-06 DIAGNOSIS — M79642 Pain in left hand: Secondary | ICD-10-CM | POA: Diagnosis not present

## 2022-11-06 DIAGNOSIS — I480 Paroxysmal atrial fibrillation: Secondary | ICD-10-CM | POA: Diagnosis not present

## 2022-11-06 DIAGNOSIS — Z8589 Personal history of malignant neoplasm of other organs and systems: Secondary | ICD-10-CM | POA: Diagnosis not present

## 2022-11-06 LAB — CYTOLOGY - NON PAP

## 2022-11-06 NOTE — Patient Instructions (Addendum)
It is good to see you today. 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  I will send you back to Dr. [name] for treatment.  Dr. [name] will determine of you need radiation referral also.  You will get a call to get an appointment scheduled with Dr. [name].  I have placed a referral.  Call Dr. Remonia Richter office if you do not get a call to get scheduled in the next few days.  Her number is [phone]. Call us if you need [name].  Please contact office for sooner follow up if symptoms do not improve or worsen or seek emergency care

## 2022-11-06 NOTE — Progress Notes (Signed)
Reviewed at office visit with SG   Thanks,  BLI  Josephine Igo, DO [facility] 11/06/2022 2:03 PM

## 2022-11-06 NOTE — Progress Notes (Signed)
Subjective:    Patient ID: Brian [name], male    DOB: 22-Apr-1955, 67 y.o.   MRN: [id number]  HPI 10/16/22 His biggest concern is a contracture in his left hand.  His third fourth and fifth fingers are contracted at the MCP joint, PIP joint, and DIP joint.  I am unable to passively extend the MCP joint, PIP joint, or DIP joint.  There are no palpable scar tissue in the palm of his hand however I suspect Dupuytren's contracture.  He states that it gradually got like this over the last 4 to 5 months.  He does have a history of stroke.  However he denies any new neurologic deficit.  He is unable to abduct his left arm greater than 90 degrees but this is due to an old shoulder injury.  He has full flexion and extension of his elbow without weakness.  He is able to move his thumb and index finger fully without any weakness.  There is no numbness or tingling in his left upper extremity.  It is limited to a flexor contraction of the MCP and PIP and DIP joints.  Original consult hand surgery in February but the patient states that he never heard from them.  He is currently only on Actos for diabetes.  His hemoglobin A1c in February was 6.5.  He is not checking his sugars.  He is currently taking Eliquis for atrial fibrillation.  However he is only taking it once a day.  He also has stopped his carvedilol several months ago.  As result, today on exam, he is tachycardic.  He does report some shortness of breath with activity but he denies any chest pain.  His heart rate is well above 100 today.  On my exam is 110 bpm.  There is +2 pitting edema in both legs distal to the knee.  However his lungs are clear to auscultation bilaterally.  At that time, my plan was: The question is whether the patient has Dupuytren's contracture in the left hand or whether this is a contracture related to his previous stroke.  Given his range of motion, the fact he still has use of his thumb and his index finger, has normal sensation,  and the fact that I cannot passively extend the 3 digits at the MCP joint, I believe the patient is dealing with Dupuytren's contracture.  Therefore I placed another referral to a hand surgeon.  The patient's blood pressure today is elevated.  However, he is profoundly tachycardic.  This is due to not taking his carvedilol.  I refilled his carvedilol today and asked him to resume and immediately.  Also asked him to go home and take 1 dose of Lasix as he is clearly fluid overloaded exam.  I believe this exacerbated by the tachycardia.  Hopefully the Lasix will facilitate diuresis and improving rate control will prevent reaccumulation of fluid.  Check hemoglobin A1c today.  The patient is on Actos due to cost.  This is a poor option and the patient who has peripheral edema.  However he refuses all insulin and namebrand medication.  I would much rather have the patient on Jardiance.  Based on the patient's lab work, may substitute Jardiance.  Does have repeat kidney disease.  Therefore I will need to assess his renal function before starting the Jardiance.  Metformin is not an option for him due to his renal function  11/06/22 Labs at last visit revealed elevated BNP along with fluid overload.  Recommended  discontinuation of actos and starting jardiance instead.  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  Has been referred to oncology.  Patient has discontinued Actos.  He has started [place].  The swelling in his legs has improved dramatically.  He has resumed carvedilol.  Although he is in atrial fibrillation today his heart rate is controlled in the 90s on exam.  His lungs sound clear today although he has had a thoracentesis.  He denies any shortness breath or chest pain.  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 [name], laser therapy OD   Seizures (HCC)    pt reports this is from low blood sugar   Past Surgical History:  Procedure Laterality Date   BRONCHIAL NEEDLE ASPIRATION BIOPSY  10/30/2022   Procedure: BRONCHIAL NEEDLE ASPIRATION BIOPSIES;  Surgeon: Josephine Igo, DO;  Location: [facility] ENDOSCOPY;  Service: Cardiopulmonary;;   COLONOSCOPY     DIRECT LARYNGOSCOPY N/A 02/10/2020   Procedure: DIRECT LARYNGOSCOPY WITH BIOPSY;  Surgeon: Newman Pies, MD;  Location: [facility] OR;  Service: ENT;  Laterality: N/A;   Ear surgery as a child     IR GASTROSTOMY TUBE MOD SED  03/23/2020   IR GASTROSTOMY TUBE REMOVAL  06/01/2020   IR IMAGING GUIDED PORT INSERTION  03/09/2020   IR REMOVAL TUN ACCESS W/ PORT W/O FL MOD SED  02/08/2021   THORACENTESIS  10/30/2022   Procedure: Alanson Puls;  Surgeon: Josephine Igo, DO;  Location: [facility] ENDOSCOPY;  Service: Cardiopulmonary;;  L pleural effusion   TONSILLECTOMY     VIDEO BRONCHOSCOPY Left 10/30/2022   Procedure: VIDEO BRONCHOSCOPY WITHOUT FLUORO;  Surgeon: Josephine Igo, DO;  Location: [facility] ENDOS[facility];  Service: Cardiopulmonary;  Laterality: Left;   VIDEO BRONCHOSCOPY WITH ENDOBRONCHIAL ULTRASOUND Bilateral 10/30/2022   Procedure: VIDEO BRONCHOSCOPY WITH ENDOBRONCHIAL ULTRASOUND;  Surgeon: Josephine Igo, DO;  Location: [facility] ENDOS[facility];  Service: Cardiopulmonary;  Laterality: Bilateral;   Social History   Socioeconomic History   Marital status: Divorced    Spouse name: Not on file   Number of children: 2   Years of education: 12   Highest education level: 12th grade  Occupational History   Occupation: Disabled  Tobacco Use   Smoking status: Never    Passive exposure: Never   Smokeless tobacco: Never   Tobacco  comments:    Verified by son, Tarig Barrozo  Vaping Use   Vaping status: Never Used  Substance and Sexual Activity   Alcohol use: Yes    Alcohol/week: 0.0 standard drinks of alcohol    Comment: seldom   Drug use: Not Currently    Frequency: 7.0 times per week    Types: Marijuana    Comment: marijauna for arthritis, last 03-13-20   Sexual activity: Not Currently    Partners: Female  Other Topics Concern   Not on file  Social History Narrative   Two children.  5 grandchildren.     Social Determinants of Corporate investment banker  Strain: Low Risk  (04/05/2022)   Overall Financial Resource Strain (CARDIA)    Difficulty of Paying Living Expenses: Not hard at all  Food Insecurity: No Food Insecurity (04/05/2022)   Hunger Vital Sign    Worried About Running Out of Food in the Last Year: Never true    Ran Out of Food in the Last Year: Never true  Transportation Needs: No Transportation Needs (04/05/2022)   PRAPARE - Administrator, Civil Service (Medical): No    Lack of Transportation (Non-Medical): No  Physical Activity: Inactive (04/05/2022)   Exercise Vital Sign    Days of Exercise per Week: 0 days    Minutes of Exercise per Session: 0 min  Stress: No Stress Concern Present (04/05/2022)   [employer] of Occupational Health - Occupational Stress Questionnaire    Feeling of Stress : Not at all  Social Connections: Socially Isolated (04/05/2022)   Social Connection and Isolation Panel [NHANES]    Frequency of Communication with Friends and Family: Twice a week    Frequency of Social Gatherings with Friends and Family: Never    Attends Religious Services: Never    Database administrator or Organizations: No    Attends Banker Meetings: Never    Marital Status: Divorced  Catering manager Violence: Not At Risk (04/05/2022)   Humiliation, Afraid, Rape, and Kick questionnaire    Fear of Current or Ex-Partner: No    Emotionally Abused: No    Physically  Abused: No    Sexually Abused: No   Current Outpatient Medications on File Prior to Visit  Medication Sig Dispense Refill   apixaban (ELIQUIS) 5 MG TABS tablet Take 1 tablet (5 mg total) by mouth 2 (two) times daily. 60 tablet 5   atorvastatin (LIPITOR) 20 MG tablet Take 1 tablet (20 mg total) by mouth daily. 90 tablet 3   Blood Glucose Monitoring Suppl (ONETOUCH VERIO IQ SYSTEM) w/Device KIT 1 each by Does not apply route 2 (two) times daily. 1 kit 0   carvedilol (COREG) 25 MG tablet Take 1 tablet (25 mg total) by mouth 2 (two) times daily. 180 tablet 3   Continuous Blood Gluc Sensor (FREESTYLE LIBRE 2 SENSOR) MISC Use as directed to monitor blood glucose continuously. Change sensor Q 14 days. DX E11.65 2 each 11   diltiazem (CARDIZEM CD) 240 MG 24 hr capsule Take 1 capsule (240 mg total) by mouth daily. 90 capsule 1   doxazosin (CARDURA) 2 MG tablet TAKE 1 TABLET BY MOUTH EVERY DAY 90 tablet 1   empagliflozin (JARDIANCE) 25 MG TABS tablet Take 1 tablet (25 mg total) by mouth daily before breakfast. 90 tablet 1   ferrous sulfate 325 (65 FE) MG EC tablet Take 325 mg by mouth 3 (three) times daily with meals.     furosemide (LASIX) 40 MG tablet TAKE 1 TABLET (40 MG TOTAL) BY MOUTH AS NEEDED FOR FLUID OR EDEMA. 90 tablet 3   glucose blood test strip 1 each by Other route as needed for other. Use as instructed 100 each 11   hydrALAZINE (APRESOLINE) 50 MG tablet Take 1 tablet (50 mg total) by mouth 3 (three) times daily. 90 tablet 0   losartan (COZAAR) 100 MG tablet Take 1 tablet (100 mg total) by mouth daily. 90 tablet 1   pioglitazone (ACTOS) 30 MG tablet Take 1 tablet (30 mg total) by mouth daily. 90 tablet 0   No current facility-administered medications on file prior to visit.  Allergies  Allergen Reactions   Codeine Other (See Comments)    Unknown reaction, severe headach   Metformin And Related Diarrhea     Review of Systems  All other systems reviewed and are negative.       Objective:   Physical Exam Vitals reviewed.  Constitutional:      Appearance: Normal appearance. He is normal weight.  Cardiovascular:     Rate and Rhythm: Normal rate. Rhythm irregular.     Heart sounds: Normal heart sounds. No murmur heard.    No friction rub. No gallop.  Pulmonary:     Effort: Pulmonary effort is normal.     Breath sounds: Normal breath sounds. No wheezing, rhonchi or rales.  Abdominal:     General: Abdomen is flat. Bowel sounds are normal.     Palpations: Abdomen is soft.  Musculoskeletal:     Right lower leg: No edema.     Left lower leg: No edema.  Neurological:     Mental Status: He is alert and oriented to person, place, and time. Mental status is at baseline.     Motor: Weakness present.   See hand description in HPI    Assessment & Plan:  Stage 3b chronic kidney disease (HCC) - Plan: BASIC METABOLIC PANEL WITH GFR  Flu vaccine need - Plan: Flu vaccine trivalent PF, 6mos and older(Flulaval,Afluria,Fluarix,Fluzone)  Paroxysmal atrial fibrillation (HCC)  Acute congestive heart failure, unspecified heart failure type [facility]) Although patient is in atrial fibrillation.  His heart rate is controlled and he is appropriately anticoagulated.  At his last office visit he was fluid overloaded.  He is now euvolemic.  He is taking Lasix on a daily basis.  He has discontinued Actos and he is started [place].  Will make no other changes at this time although I will check his renal function and his potassium to ensure that there is no prerenal azotemia top of his stage IIIb chronic kidney disease

## 2022-11-06 NOTE — Telephone Encounter (Signed)
Left patient a message regarding patient appointment; left message with patient' s son as well regarding appointments

## 2022-11-06 NOTE — Progress Notes (Signed)
History of Present Illness Brian [name] is a 67 y.o. 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 Referred in June 2024 for abnormal CT chest by [name], MD . He is followed by Dr. [name].   11/06/2022 Pt. Presents alone today for follow up after bronchoscopy with biopsies 10/30/2022.He states he has been doing well. No issues post procedure.   No hemoptysis, no fever, no problems with anesthesia. Secretions are white.  He is a Never smoker. We did discuss his biopsy results. The biopsy of the 4 R ode was positive for squamous cell carcinoma, the pleural fluid was negative. I called pathology for results of the pending LLL mass and the 4L lymph node. Both have resulted as atypical cells per pathology. Results are not currently in EPIC.  We discussed that this is most likely recurrence from his previous head and neck cancer. 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  He prefers to be referred back to Dr. [name] for treatment as she she has an establish care relationship with him.  Once she has seen him if she feels radiation is indicated she can refer him to radiation also.  He has a previous care relationship with Dr. [name] in the radiation oncology department. I have made the referral to hematology oncology. Patient is hopeful that this can also be treated.  He is a never smoker I am unsure if he has had previous molecular testing done, however unsure oncology will manage testing for this to determine if there is an option for immune therapy.   Test Results: Cytology 10/30/2022 Clinical History: Lung mass, effusion  Specimen Submitted:  A. LYMPH NODE, 4R, FINE NEEDLE ASPIRATION:    FINAL MICROSCOPIC DIAGNOSIS:  - Malignant cells present  - Squamous cell carcinoma  - See comment   A. PLEURAL FLUID, LEFT,  THORACENTESIS:    FINAL MICROSCOPIC DIAGNOSIS:  - Reactive mesothelial cells present   SPECIMEN ADEQUACY:  Satisfactory for evaluation   DIAGNOSTIC COMMENTS:  Mixed acute and chronic inflammation is present.   Per telephone call with Dr. [name], 11/06/2022 the left lower lobe mass was positive for atypical cells, and the 4L lymph node was positive for atypical cells. These results are not currently in epic however will be shortly.     Latest Ref Rng & Units 10/16/2022   11:45 AM 04/17/2022   11:44 AM 11/09/2021    7:26 AM  CBC  WBC 3.8 - 10.8 Thousand/uL 5.4  2.5  4.6   Hemoglobin 13.2 - 17.1 g/dL 30.8  9.4  65.7   Hematocrit 38.5 - 50.0 % 32.4  28.7  33.1   Platelets 140 - 400 Thousand/uL 218  142  204        Latest Ref Rng & Units 10/16/2022   11:45 AM 04/17/2022   11:44 AM 11/09/2021    7:26 AM  BMP  Glucose 65 - 99 mg/dL 846  962  952   BUN 7 - 25 mg/dL 20  33  22   Creatinine 0.70 - 1.35 mg/dL 8.41  3.24  4.01   BUN/Creat Ratio 6 - 22 (calc) 12  16    Sodium 135 - 146 mmol/L 138  139  133   Potassium 3.5 - 5.3 mmol/L 3.4  4.6  4.3   Chloride 98 - 110 mmol/L 101  110  95   CO2 20 - 32 mmol/L 24  21  26    Calcium 8.6 - 10.3 mg/dL 9.1  8.6  8.7     BNP    Component Value Date/Time   BNP 309 (H) 10/16/2022 1146    ProBNP    Component Value Date/Time   PROBNP 350.0 (H) 12/09/2009 1645    PFT    Component Value Date/Time   FEV1PRE 3.27 06/07/2021 1254   FEV1POST 3.34 06/07/2021 1254   FVCPRE 3.95 06/07/2021 1254   FVCPOST 4.03 06/07/2021 1254   TLC 6.09 06/07/2021 1254   DLCOUNC 15.35 06/07/2021 1254   PREFEV1FVCRT 83 06/07/2021 1254   PSTFEV1FVCRT 83 06/07/2021 1254    DG Chest Port 1 View  Result Date: 10/30/2022 CLINICAL DATA:  Post bronchoscopy EXAM: PORTABLE CHEST 1 VIEW COMPARISON:  PET-CT-09/10/2022; chest CT-07/25/2022 11/03/2021 FINDINGS: Grossly unchanged enlarged cardiac silhouette and mediastinal contours with obscuration the left heart  border secondary to left-sided pleural effusion and associated left basilar consolidative opacities and volume loss. Atherosclerotic plaque within the thoracic aorta. No evidence of pneumothorax post reported history of recent bronchoscopy. Minimal right infrahilar heterogeneous opacities favored atelectasis. No right-sided pleural effusion. No evidence of edema. No acute osseous abnormalities. IMPRESSION: 1. No evidence of pneumothorax post reported history of recent bronchoscopy. 2. Unchanged left-sided pleural effusion and associated left basilar consolidative opacities and volume loss. Electronically Signed   By: Simonne Come M.D.   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 [name], laser therapy OD   Seizures (HCC)    pt reports this is from low blood sugar     Social History   Tobacco Use   Smoking status: Never    Passive exposure: Never   Smokeless tobacco: Never   Tobacco comments:    Verified by son, Brian [name]  Vaping Use   Vaping status: Never Used  Substance Use Topics   Alcohol use: Yes    Alcohol/week: 0.0 standard drinks of alcohol    Comment: seldom   Drug use: Not Currently    Frequency: 7.0 times per week    Types: Marijuana    Comment: marijauna for arthritis, last 03-13-20    Brian [name] reports that he has never smoked. He has never been exposed to tobacco smoke. He has never used smokeless tobacco. He reports current alcohol use. He reports that he does not currently use drugs after having used the following drugs: Marijuana. Frequency: 7.00 times per week.  Tobacco Cessation: Never smoker per patient   Past surgical hx, Family hx, Social  hx all reviewed.  Current Outpatient Medications on File Prior to Visit  Medication Sig   apixaban (ELIQUIS) 5 MG TABS tablet Take 1 tablet (5 mg total) by mouth 2 (two) times daily.   atorvastatin (LIPITOR) 20 MG tablet Take 1 tablet (20 mg total) by mouth daily.   Blood Glucose Monitoring Suppl (ONETOUCH VERIO IQ SYSTEM) w/Device KIT 1 each by Does not apply route 2 (two) times daily.   carvedilol (COREG) 25 MG tablet Take 1 tablet (25 mg total) by  mouth 2 (two) times daily.   Continuous Blood Gluc Sensor (FREESTYLE LIBRE 2 SENSOR) MISC Use as directed to monitor blood glucose continuously. Change sensor Q 14 days. DX E11.65   diltiazem (CARDIZEM CD) 240 MG 24 hr capsule Take 1 capsule (240 mg total) by mouth daily.   doxazosin (CARDURA) 2 MG tablet TAKE 1 TABLET BY MOUTH EVERY DAY   empagliflozin (JARDIANCE) 25 MG TABS tablet Take 1 tablet (25 mg total) by mouth daily before breakfast.   ferrous sulfate 325 (65 FE) MG EC tablet Take 325 mg by mouth 3 (three) times daily with meals.   furosemide (LASIX) 40 MG tablet TAKE 1 TABLET (40 MG TOTAL) BY MOUTH AS NEEDED FOR FLUID OR EDEMA.   glucose blood test strip 1 each by Other route as needed for other. Use as instructed   hydrALAZINE (APRESOLINE) 50 MG tablet Take 1 tablet (50 mg total) by mouth 3 (three) times daily.   losartan (COZAAR) 100 MG tablet Take 1 tablet (100 mg total) by mouth daily.   pioglitazone (ACTOS) 30 MG tablet Take 1 tablet (30 mg total) by mouth daily.   No current facility-administered medications on file prior to visit.     Allergies  Allergen Reactions   Codeine Other (See Comments)    Unknown reaction, severe headach   Metformin And Related Diarrhea    Review Of Systems:  Constitutional:   No  weight loss, night sweats,  Fevers, chills, fatigue, or  lassitude.  HEENT:   No headaches,  Difficulty swallowing,  Tooth/dental problems, or  Sore throat,                No sneezing, itching, ear ache, nasal  congestion, post nasal drip,   CV:  No chest pain,  Orthopnea, PND, swelling in lower extremities, anasarca, dizziness, palpitations, syncope.   GI  No heartburn, indigestion, abdominal pain, nausea, vomiting, diarrhea, change in bowel habits, loss of appetite, bloody stools.   Resp: No shortness of breath with exertion or at rest.  No excess mucus, no productive cough,  No non-productive cough,  No coughing up of blood.  No change in color of mucus.  No wheezing.  No chest wall deformity  Skin: no rash or lesions.  GU: no dysuria, change in color of urine, no urgency or frequency.  No flank pain, no hematuria   MS:  No joint pain or swelling.  No decreased range of motion.  No back pain.  Psych:  No change in mood or affect. No depression or anxiety.  No memory loss.   Vital Signs BP (!) 142/60 (BP Location: Right Arm, Cuff Size: Normal)   Pulse 81   Temp 98.1 F (36.7 C) (Temporal)   Ht 5\' 9"  (1.753 m)   Wt 169 lb 3.2 oz (76.7 kg)   SpO2 99%   BMI 24.99 kg/m    Physical Exam:  General- No distress,  A&Ox3, pleasant without complaints ENT: No sinus tenderness, TM clear, pale nasal mucosa, no oral exudate,no post nasal drip, no LAN Cardiac: S1, S2, regular rate and rhythm, no murmur Chest: No wheeze/ rales/ dullness; no accessory muscle use, no nasal flaring, no sternal retractions Abd.: Soft Non-tender, ND, BS +, Body mass index is 24.99 kg/m. Ext: No clubbing cyanosis, edema Neuro:  normal strength, MAE x 4, A&O x 3, appropriate Skin: No rashes, warm and dry, no lesions  Psych: normal mood and behavior   Assessment/Plan New diagnosis squamous cell lung cancer, 4R node,  atypical cells 4L node and left lower lobe mass, recurrent after treatment for head and neck, in never smoker  Previous history head and neck squamous cell cancer, treated with radiation and chemotherapy Plan Your biopsy was positive for squamous cell lung cancer in the 4 R lymph node. This is the same  type of cancer that you had in your tongue and throat, so we suspect it has recurred.  I will send you back to Dr. [name] for treatment.  Dr. [name] will determine of you need radiation referral also.  You will get a call to get an appointment scheduled with Dr. [name].  I have placed a referral.  Call Dr. Remonia Richter office if you do not get a call to get scheduled in the next few days.  Her number is [phone]. Call us if you need [name].  Please contact office for sooner follow up if symptoms do not improve or worsen or seek emergency care    I spent 35 minutes dedicated to the care of this patient on the date of this encounter to include pre-visit review of records, face-to-face time with the patient discussing conditions above, post visit ordering of testing, clinical documentation with the electronic health record, making appropriate referrals as documented, and communicating necessary information to the patient's healthcare team.      Bevelyn Ngo, NP 11/06/2022  9:18 AM

## 2022-11-08 ENCOUNTER — Other Ambulatory Visit: Payer: Self-pay

## 2022-11-08 DIAGNOSIS — C01 Malignant neoplasm of base of tongue: Secondary | ICD-10-CM

## 2022-11-08 MED ORDER — FUROSEMIDE 20 MG PO TABS: 20.0000 mg | ORAL_TABLET | Freq: Every day | ORAL | 3 refills | Status: DC

## 2022-11-08 NOTE — Progress Notes (Signed)
Palliative care referral placed per Dr. Colletta [place] orders.

## 2022-11-08 NOTE — Progress Notes (Signed)
Thoracic Location of Tumor / Histology:  CT Chest Wo Contrast 07/25/2022  IMPRESSION: 1. Increased size of left infrahilar lymph node and new enlarged mediastinal lymph nodes, concerning for metastatic disease. 2. New near-complete occlusion of the proximal left lower lobe bronchus with evidence of endobronchial soft tissue, likely due to aspiration, although endobronchial lesion can not be excluded. Recommend attention on follow-up. 3. Increased linear opacities of the left lung base, likely combination of postradiation change and atelectasis. 4. Small left pleural effusion, increased in size when compared with the prior. 5. Aortic Atherosclerosis (ICD10-I70.0).   NM PET Image Initial (PI) Skull Base To Thigh 09/10/2022  IMPRESSION: 1. Hypermetabolic mediastinal and left hilar adenopathy, consistent with disease recurrence. 2. Moderate left pleural effusion, increased in size from 07/25/2022, with collapse/consolidation in the left lower lobe, possibly postobstructive in etiology. 3. Small pericardial effusion. 4. Trace right pleural fluid. 5. Aortic atherosclerosis (ICD10-I70.0). Coronary artery calcification.  Patient presented symptoms of: Found on imaging, pt also has pleural effusion as well  Biopsies revealed:  10-30-22 FINAL MICROSCOPIC DIAGNOSIS:   B. LLL MASS, FINE NEEDLE ASPIRATION:  - Atypical cells present   C. LYMPH NODE, 4L, FINE NEEDLE ASPIRATION:  - Atypical cells present    SPECIMEN ADEQUACY:  B. Satisfactory for Evaluation  C. Satisfactory for Evaluation   FINAL MICROSCOPIC DIAGNOSIS:  - Malignant cells present  - Squamous cell carcinoma  - See comment   SPECIMEN ADEQUACY:  Satisfactory for evaluation  Satisfactory for evaluation   DIAGNOSTIC COMMENTS:  Immunohistochemical stains show that the tumor cells are positive for  CK5/6 and p63 while they are negative for TTF-1, consistent with above  interpretation.  Dr. Kenyon Ana reviewed the case  and concurs with the  above diagnosis.  Dr. [name] was notified on 11/02/2022.   Tobacco/Marijuana/Snuff/ETOH use: pt has never smoked or used smokeless tobacco products, pt does use alcohol products currently  Past/Anticipated interventions by cardiothoracic surgery, if any:   Video Bronchoscopy with Endobronchial Ultrasound Procedure Note   Date of Operation: 10/30/2022   Pre-op Diagnosis: Adenopathy    Post-op Diagnosis: Adenopathy    Surgeon: Josephine Igo, DO  10-30-22 Provider Performing:Bradley L Icard    Procedure: Thoracentesis with imaging guidance (16109)  Indication(s) Pleural Effusion  Past/Anticipated interventions by medical oncology, if any: Pt will see Dr. [name] on 11-16-22.   Signs/Symptoms Weight changes, if any: no has not noticed, eating and drinking well Wt Readings from Last 3 Encounters:  11/16/22 158 lb (71.7 kg)  11/06/22 169 lb (76.7 kg)  11/06/22 169 lb 3.2 oz (76.7 kg)    Respiratory complaints, if any: none to report Hemoptysis, if any: no Pain issues, if any:  no  SAFETY ISSUES: Prior radiation? Yes, in 2022 for head and neck cancer, and 2023 for Lung cancer Pacemaker/ICD? no  Possible current pregnancy?no Is the patient on methotrexate? no  Current Complaints / other details:  Wants to the plan overall. No major concerns or questions.

## 2022-11-13 ENCOUNTER — Telehealth: Payer: Self-pay | Admitting: Nurse Practitioner

## 2022-11-13 ENCOUNTER — Encounter (HOSPITAL_COMMUNITY): Payer: Self-pay

## 2022-11-14 DIAGNOSIS — C3490 Malignant neoplasm of unspecified part of unspecified bronchus or lung: Secondary | ICD-10-CM | POA: Diagnosis not present

## 2022-11-15 ENCOUNTER — Other Ambulatory Visit: Payer: Medicare HMO

## 2022-11-15 NOTE — Progress Notes (Signed)
Radiation Oncology         [phone] ________________________________  Outpatient Re-Consultation  Name: Brian [name] MRN: [id number]  Date: 11/16/2022  DOB: Dec 12, 1955  [id number], Brian [place], MD  Brian Igo, DO   REFERRING PHYSICIAN: Josephine Igo, DO  DIAGNOSIS: C77.1    ICD-10-CM   1. 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    Cancer Staging  Malignant neoplasm of base of tongue (HCC) Staging form: Pharynx - HPV-Mediated Oropharynx, AJCC 8th Edition - Clinical stage from 03/01/2020: Stage III (cT2, cN3, cM0, p16+) - Signed by Brian Peak, MD on 03/02/2020 Stage prefix: Initial diagnosis   CHIEF COMPLAINT: Here to discuss management of lung cancer  HISTORY OF PRESENT ILLNESS::Brian [name] is a 67 y.o. male who presents today for consideration of radiation therapy in management of his recently diagnosed squamous cell lung cancer. 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 He was last seen here on 02/06/22 for follow up of radiation to the LLL which he completed in May of 2023. His most recent chest CT from 12/15 was reviewed at that time showed an excellent response to SBRT in his lung with complete remission achieved. He has continued to follow with medical oncology since that time.    This past June, the patient presented for a follow-up chest CT on 07/25/22 which showed: a interval increase in a left infrahilar lymph node concerning for metastatic disease, and new near complete occlusion of the proximal left  lower lobe bronchus with evidence of endobronchial soft tissue, likely due to aspiration (although an endobronchial lesion could not be excluded).  Other findings included increased linear opacities of the left lung base, likely representing a combination of postradiation changes and atelectasis, and an interval increase in size of a small left pleural effusion.  A soft tissue neck CT without contrast was also performed on 06/05 which showed no evidence of recurrent disease and stable post treatment changes in the neck.   He was accordingly referred to Dr. Tonia [name] on 08/07/22 for further evaluation and management. 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   He did however agree to proceed with a PET scan on 09/10/22 which demonstrated: hypermetabolic mediastinal and left hilar adenopathy consistent with disease recurrence, and an increase in size of a moderate left pleural effusion with associated collapse/consolidation in the left lower lobe, possibly representing a postobstructive etiology. Other findings included a small pericardial effusion and a trace right pleural effusion.   After taking some time to reflect, the patient opted to proceed with bronchoscopy and biopsies (and left sided thoracentesis) on 10/30/22 under the care of Dr. Tonia [name]. FNA of lymph node 4R showed malignant cells present consistent with squamous cell carcinoma. FNA of lymph node 4L and a LLL mass (found during the procedure) also showed atypical cells present. Pleural fluid from thoracentesis was also sent for cytology and showed  no evidence of malignancy.   He is scheduled to meet with Dr. Al [name] later this afternoon to discuss systemic treatment options (if indicated).   Of note: PD-L1 testing was obtained on the biopsy specimen and shows a TPS score of <1%.   PREVIOUS RADIATION THERAPY: Yes   Radiation Treatment  Dates: 03/14/2020 through 05/04/2020 Site Technique Total Dose (Gy) Dose per Fx (Gy) Completed Fx Beam Energies  Neck: HN_BOT IMRT 70/70 2 35/35 6X    From 06/21/2021 to 07/06/2021 he received 60 Gray in 5 fractions, stereotactic body radiation therapy, to left lower lobe lung mass (lung mass was not biopsied)  PAST MEDICAL HISTORY:  has a past medical history of Arthritis, Atrial fibrillation (HCC), Cancer (HCC), Cardiomyopathy, Cerebrovascular accident (HCC), Diabetes mellitus, Hyperlipidemia, Hypertension, Left-sided weakness, 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 (HCC), and Seizures (HCC).    PAST SURGICAL HISTORY: Past Surgical History:  Procedure Laterality Date   BRONCHIAL NEEDLE ASPIRATION BIOPSY  10/30/2022   Procedure: BRONCHIAL NEEDLE ASPIRATION BIOPSIES;  Surgeon: Brian Igo, DO;  Location: [facility];  Service: Cardiopulmonary;;   COLONOSCOPY     DIRECT LARYNGOSCOPY N/A 02/10/2020   Procedure: DIRECT LARYNGOSCOPY WITH BIOPSY;  Surgeon: Brian Pies, MD;  Location: [facility] OR;  Service: ENT;  Laterality: N/A;   Ear surgery as a child     IR GASTROSTOMY TUBE MOD SED  03/23/2020   IR GASTROSTOMY TUBE REMOVAL  06/01/2020   IR IMAGING GUIDED PORT INSERTION  03/09/2020   IR REMOVAL TUN ACCESS W/ PORT W/O FL MOD SED  02/08/2021   THORACENTESIS  10/30/2022   Procedure: Alanson Puls;  Surgeon: Brian Igo, DO;  Location: [facility];  Service: Cardiopulmonary;;  L pleural effusion   TONSILLECTOMY     VIDEO BRONCHOSCOPY Left 10/30/2022   Procedure: VIDEO BRONCHOSCOPY WITHOUT FLUORO;  Surgeon: Brian Igo, DO;  Location: [facility];  Service: Cardiopulmonary;  Laterality: Left;   VIDEO BRONCHOSCOPY WITH ENDOBRONCHIAL ULTRASOUND Bilateral 10/30/2022   Procedure: VIDEO BRONCHOSCOPY WITH ENDOBRONCHIAL ULTRASOUND;  Surgeon: Brian Igo, DO;  Location: [facility];  Service: Cardiopulmonary;  Laterality: Bilateral;    FAMILY HISTORY: family history includes Breast cancer in his  sister; Colon cancer in his mother; Hyperlipidemia in his father; Hypertension in his father.  SOCIAL HISTORY:  reports that he has never smoked. He has never been exposed to tobacco smoke. He has never used smokeless tobacco. He reports current alcohol use. He reports that he does not currently use drugs after having used the following drugs: Marijuana. Frequency: 7.00 times per week.  ALLERGIES: Codeine and Metformin and related  MEDICATIONS:  Current Outpatient Medications  Medication Sig Dispense Refill   apixaban (ELIQUIS) 5 MG TABS tablet Take 1 tablet (5 mg total) by mouth 2 (two) times daily. 60 tablet 5   atorvastatin (LIPITOR) 20 MG tablet Take 1 tablet (20 mg total) by mouth daily. 90 tablet 3   Blood Glucose Monitoring Suppl (ONETOUCH VERIO IQ SYSTEM) w/Device KIT 1 each by Does not apply route 2 (two) times daily. 1 kit 0   carvedilol (COREG) 25 MG tablet Take 1 tablet (25 mg total) by mouth 2 (two) times daily. 180 tablet 3   Continuous Blood Gluc Sensor (FREESTYLE LIBRE 2 SENSOR) MISC Use as directed to monitor blood glucose continuously. Change sensor Q 14 days. DX E11.65 2 each 11   diltiazem (CARDIZEM CD) 240 MG 24 hr capsule Take 1 capsule (240 mg total) by mouth daily. 90 capsule 1   doxazosin (CARDURA)  2 MG tablet TAKE 1 TABLET BY MOUTH EVERY DAY 90 tablet 1   empagliflozin (JARDIANCE) 25 MG TABS tablet Take 1 tablet (25 mg total) by mouth daily before breakfast. 90 tablet 1   furosemide (LASIX) 20 MG tablet Take 1 tablet (20 mg total) by mouth daily. 30 tablet 3   glucose blood test strip 1 each by Other route as needed for other. Use as instructed 100 each 11   hydrALAZINE (APRESOLINE) 50 MG tablet Take 1 tablet (50 mg total) by mouth 3 (three) times daily. 90 tablet 0   losartan (COZAAR) 100 MG tablet Take 1 tablet (100 mg total) by mouth daily. 90 tablet 1   pioglitazone (ACTOS) 30 MG tablet Take 1 tablet (30 mg total) by mouth daily. 90 tablet 0   ferrous sulfate 325  (65 FE) MG EC tablet Take 325 mg by mouth 3 (three) times daily with meals. (Patient not taking: Reported on 11/16/2022)     No current facility-administered medications for this encounter.    REVIEW OF SYSTEMS:  Notable for that above.   PHYSICAL EXAM:  height is 5\' 9"  (1.753 m) and weight is 158 lb (71.7 kg). His temporal temperature is 98 F (36.7 C). His blood pressure is 140/84 (abnormal) and his pulse is 88. His respiration is 20 and oxygen saturation is 99%.    General: Alert and oriented, in no acute distress HEENT: Head is normocephalic. Extraocular movements are intact. Oropharynx is notable for no thrush,  no tumor.    Neck:  no obvious adenopathy to deep palpation. Fibrosis of neck soft tissues Skin: neck skin intact and satisfactorily healed Psychiatric: Pleasant to speak with. Affect is appropriate. Chest CTAB   Heart RRR  Abd: Soft NT ND Ext: contractures of left hand fingers  LABORATORY DATA:  Lab Results  Component Value Date   WBC 5.4 10/16/2022   HGB 10.6 (L) 10/16/2022   HCT 32.4 (L) 10/16/2022   MCV 89.8 10/16/2022   PLT 218 10/16/2022   CMP     Component Value Date/Time   NA 138 11/06/2022 1436   NA 139 09/01/2021 1014   K 4.2 11/06/2022 1436   CL 102 11/06/2022 1436   CO2 29 11/06/2022 1436   GLUCOSE 105 (H) 11/06/2022 1436   BUN 32 (H) 11/06/2022 1436   BUN 17 09/01/2021 1014   CREATININE 2.23 (H) 11/06/2022 1436   CALCIUM 8.9 11/06/2022 1436   PROT 6.7 10/16/2022 1145   ALBUMIN 3.3 (L) 11/03/2021 0821   AST 16 10/16/2022 1145   AST 14 (L) 03/20/2021 0909   ALT 11 10/16/2022 1145   ALT 13 03/20/2021 0909   ALKPHOS 65 11/03/2021 0821   BILITOT 0.6 10/16/2022 1145   BILITOT 0.3 03/20/2021 0909   GFR 57.49 (L) 02/28/2010 1227   EGFR 31 (L) 11/06/2022 1436   EGFR 42 (L) 09/01/2021 1014   GFRNONAA 39 (L) 11/09/2021 0726   GFRNONAA 34 (L) 03/20/2021 0909   GFRNONAA 61 11/30/2019 0915         RADIOGRAPHY: DG Chest Port 1 View  Result Date:  10/30/2022 CLINICAL DATA:  Post bronchoscopy EXAM: PORTABLE CHEST 1 VIEW COMPARISON:  PET-CT-09/10/2022; chest CT-07/25/2022 11/03/2021 FINDINGS: Grossly unchanged enlarged cardiac silhouette and mediastinal contours with obscuration the left heart border secondary to left-sided pleural effusion and associated left basilar consolidative opacities and volume loss. Atherosclerotic plaque within the thoracic aorta. No evidence of pneumothorax post reported history of recent bronchoscopy. Minimal right infrahilar heterogeneous opacities favored  atelectasis. No right-sided pleural effusion. No evidence of edema. No acute osseous abnormalities. IMPRESSION: 1. No evidence of pneumothorax post reported history of recent bronchoscopy. 2. Unchanged left-sided pleural effusion and associated left basilar consolidative opacities and volume loss. Electronically Signed   By: Simonne Come M.D.   On: 10/30/2022 12:09      IMPRESSION/PLAN:  Today, I talked to the patient about the findings and work-up thus far.  We discussed the patient's diagnosis of squamous cell carcinoma in the thoracic nodes and general treatment for this, highlighting the role of radiotherapy in the management.  We discussed the available radiation techniques, and focused on the details of logistics and delivery.   It is unclear if this is metastatic from his H+N cancer or a new lung primary.  I think metastatic disease is more likely.  I discussed his case today with Dr Brian [name].  She would like p16 testing of the sample of tissue if possible.  We will see if this is possible.  He is a candidates for definitive RT (6 weeks) +/- chemo. Final disposition pending pathology review. Will add him to tumor board (ENT).    We discussed the risks, benefits, and side effects of radiotherapy. Side effects may include but not necessarily be limited to: skin irritation, fatigue, esophagitis, injury to thoracic organs.  No guarantees of treatment were given. A consent form  was signed and placed in the patient's medical record. The patient was encouraged to ask questions that I answered to the best of my ability.     We will hold off on CT simulation until disposition is more clear.  Dr Brian [name] will see him for reconsultation later today.  On date of service, in total, I spent  40 minutes on this encounter. Patient was seen in person. Note signed after encounter date; minutes pertain to date of service, only.  __________________________________________   Brian Peak, MD  This document serves as a record of services personally performed by Brian Peak, MD. It was created on her behalf by Neena Rhymes, a trained medical scribe. The creation of this record is based on the scribe's personal observations and the provider's statements to them. This document has been checked and approved by the attending provider.

## 2022-11-16 ENCOUNTER — Ambulatory Visit
Admission: RE | Admit: 2022-11-16 | Discharge: 2022-11-16 | Disposition: A | Discharge status: Home / Self Care | Payer: Medicare HMO | Source: Ambulatory Visit | Attending: Radiation Oncology | Admitting: Radiation Oncology

## 2022-11-16 ENCOUNTER — Encounter: Payer: Self-pay | Admitting: Radiation Oncology

## 2022-11-16 ENCOUNTER — Encounter (HOSPITAL_COMMUNITY): Payer: Self-pay

## 2022-11-16 ENCOUNTER — Inpatient Hospital Stay: Payer: Medicare HMO | Attending: Hematology and Oncology | Admitting: Hematology and Oncology

## 2022-11-16 ENCOUNTER — Telehealth: Payer: Self-pay | Admitting: Hematology and Oncology

## 2022-11-16 VITALS — BP 140/84 | HR 88 | Temp 98.0°F | Resp 20 | Ht 69.0 in | Wt 158.0 lb

## 2022-11-16 VITALS — BP 109/69 | HR 80 | Temp 97.9°F | Resp 18 | Ht 69.0 in | Wt 159.9 lb

## 2022-11-16 DIAGNOSIS — Z8673 Personal history of transient ischemic attack (TIA), and cerebral infarction without residual deficits: Secondary | ICD-10-CM | POA: Diagnosis not present

## 2022-11-16 DIAGNOSIS — E785 Hyperlipidemia, unspecified: Secondary | ICD-10-CM | POA: Diagnosis not present

## 2022-11-16 DIAGNOSIS — C01 Malignant neoplasm of base of tongue: Secondary | ICD-10-CM | POA: Diagnosis not present

## 2022-11-16 DIAGNOSIS — J90 Pleural effusion, not elsewhere classified: Secondary | ICD-10-CM | POA: Insufficient documentation

## 2022-11-16 DIAGNOSIS — Z79899 Other long term (current) drug therapy: Secondary | ICD-10-CM | POA: Insufficient documentation

## 2022-11-16 DIAGNOSIS — C3432 Malignant neoplasm of lower lobe, left bronchus or lung: Secondary | ICD-10-CM | POA: Insufficient documentation

## 2022-11-16 DIAGNOSIS — I4891 Unspecified atrial fibrillation: Secondary | ICD-10-CM | POA: Insufficient documentation

## 2022-11-16 DIAGNOSIS — Z800 Family history of malignant neoplasm of digestive organs: Secondary | ICD-10-CM | POA: Diagnosis not present

## 2022-11-16 DIAGNOSIS — Z923 Personal history of irradiation: Secondary | ICD-10-CM | POA: Insufficient documentation

## 2022-11-16 DIAGNOSIS — I429 Cardiomyopathy, unspecified: Secondary | ICD-10-CM | POA: Insufficient documentation

## 2022-11-16 DIAGNOSIS — R634 Abnormal weight loss: Secondary | ICD-10-CM

## 2022-11-16 DIAGNOSIS — Z85810 Personal history of malignant neoplasm of tongue: Secondary | ICD-10-CM | POA: Diagnosis not present

## 2022-11-16 DIAGNOSIS — Z9221 Personal history of antineoplastic chemotherapy: Secondary | ICD-10-CM | POA: Diagnosis not present

## 2022-11-16 DIAGNOSIS — C3492 Malignant neoplasm of unspecified part of left bronchus or lung: Secondary | ICD-10-CM

## 2022-11-16 DIAGNOSIS — Z7984 Long term (current) use of oral hypoglycemic drugs: Secondary | ICD-10-CM | POA: Diagnosis not present

## 2022-11-16 DIAGNOSIS — Z7901 Long term (current) use of anticoagulants: Secondary | ICD-10-CM | POA: Diagnosis not present

## 2022-11-16 DIAGNOSIS — I10 Essential (primary) hypertension: Secondary | ICD-10-CM | POA: Insufficient documentation

## 2022-11-16 DIAGNOSIS — C771 Secondary and unspecified malignant neoplasm of intrathoracic lymph nodes: Secondary | ICD-10-CM

## 2022-11-16 DIAGNOSIS — I700 Atherosclerosis of aorta: Secondary | ICD-10-CM | POA: Insufficient documentation

## 2022-11-16 DIAGNOSIS — I3139 Other pericardial effusion (noninflammatory): Secondary | ICD-10-CM | POA: Diagnosis not present

## 2022-11-16 NOTE — Telephone Encounter (Signed)
Called path at [facility], ordered P16 from biopsy from lymph node. Spoke with Brian [name]

## 2022-11-16 NOTE — Progress Notes (Signed)
REASON FOR VISIT:  Routine follow-up for history of head & neck cancer.  BRIEF ONCOLOGIC HISTORY:  Oncology History Overview Note  Brian [name] is a 67 y.o. male who presented two-week history of facial swelling in late November/early December 2021. Subsequently, the patient saw Dr. Suszanne Conners, who performed a direct laryngoscopy with biopsy on the date of 02/10/2020. Final pathology revealed: squamous cell carcinoma, P16 positive   He had the following imaging performed  1. Soft tissue neck CT scan performed on 01/28/2020 revealing right glossotonsillar malignancy with bulky bilateral nodal disease. Nodal masses sowed definite extracapsular spread. There was also noted to be incidental advanced cervical spine degeneration with cord compression.  2. PET scan performed on 02/26/2020 revealing a hypermetabolic lesion at the right base of the tongue and right tonsil, consistent with oropharyngeal carcinoma. Carcinoma appeared to be confined to the mucosal surface. There were also noted to bulky bilateral hypermetabolic level II metastatic lymph nodes and smaller inferior hypermetabolic level II metastatic lymph nodes. There was no evidence of metastatic disease in the chest, abdomen, or pelvis. The two small right middle lobe pulmonary nodules present on CT from 2012 were consistent with benign etiology.  Started radiation on 03/14/2020 Chemo delayed by a week due to GFR and infusion issues. Started chemotherapy on 03/21/2020 C2 delayed because of AKI Now switched to weekly carboplatin given ongoing AKI C1 of weekly carboplatin on 04/04/2020 C2 of weekly carboplatin on 04/11/2020 C3 of weekly carboplatin on 04/18/2020 C4 Weekly carboplatin on 04/25/2020 Last planned cycle omitted.   Malignant neoplasm of base of tongue (HCC)  03/01/2020 Cancer Staging   Staging form: Pharynx - HPV-Mediated Oropharynx, AJCC 8th Edition - Clinical stage from 03/01/2020: Stage III (cT2, cN3, cM0, p16+) - Signed by  Lonie Peak, MD on 03/02/2020   03/02/2020 Initial Diagnosis   Malignant neoplasm of base of tongue (HCC)   03/21/2020 - 03/28/2020 Chemotherapy         04/04/2020 - 04/25/2020 Chemotherapy   Completed chemotherapy on 04/25/2020 Patient is on Treatment Plan : HEAD/NECK Carboplatin q7d       INTERVAL HISTORY:   Patient is here for follow up.  Since his last visit here he had a CT chest and CT soft tissue which showed increased size of left infrahilar lymph node and newly enlarged mediastinal lymph nodes concerning for metastatic disease new near complete occlusion of proximal left lower lobe bronchus with evidence of endobronchial soft tissue likely due to aspiration although endobronchial lesion cannot be excluded.  Increased linear opacities of left lung base.  CT neck showed no evidence of recurrent disease.  He had endobronchial ultrasound and fine-needle aspiration of 4R lymph node which confirmed presence of squamous cell carcinoma.  There were atypical cells noted on left lower lobe mass as well as 4L lymph node.  He also had PET/CT which confirmed hypermetabolic mediastinal and left hilar adenopathy consistent with disease recurrence, moderate left pleural effusion, small pericardial effusion.  Patient has seen Dr. [name] this morning and she recommended definitive radiation for 6 weeks to the hypermetabolic mediastinal and left hilar adenopathy.  He tells me that he feels pretty good, he has been able to maintain his weight.  He also helps take care of his 56 year old mother who has dementia.  He denies any cough, chest pain or shortness of breath.   CURRENT MEDICATIONS:  Current Outpatient Medications on File Prior to Visit  Medication Sig Dispense Refill   apixaban (ELIQUIS) 5 MG TABS tablet Take  1 tablet (5 mg total) by mouth 2 (two) times daily. 60 tablet 5   atorvastatin (LIPITOR) 20 MG tablet Take 1 tablet (20 mg total) by mouth daily. 90 tablet 3   Blood Glucose Monitoring Suppl  (ONETOUCH VERIO IQ SYSTEM) w/Device KIT 1 each by Does not apply route 2 (two) times daily. 1 kit 0   carvedilol (COREG) 25 MG tablet Take 1 tablet (25 mg total) by mouth 2 (two) times daily. 180 tablet 3   Continuous Blood Gluc Sensor (FREESTYLE LIBRE 2 SENSOR) MISC Use as directed to monitor blood glucose continuously. Change sensor Q 14 days. DX E11.65 2 each 11   diltiazem (CARDIZEM CD) 240 MG 24 hr capsule Take 1 capsule (240 mg total) by mouth daily. 90 capsule 1   doxazosin (CARDURA) 2 MG tablet TAKE 1 TABLET BY MOUTH EVERY DAY 90 tablet 1   empagliflozin (JARDIANCE) 25 MG TABS tablet Take 1 tablet (25 mg total) by mouth daily before breakfast. 90 tablet 1   ferrous sulfate 325 (65 FE) MG EC tablet Take 325 mg by mouth 3 (three) times daily with meals. (Patient not taking: Reported on 11/16/2022)     furosemide (LASIX) 20 MG tablet Take 1 tablet (20 mg total) by mouth daily. 30 tablet 3   glucose blood test strip 1 each by Other route as needed for other. Use as instructed 100 each 11   hydrALAZINE (APRESOLINE) 50 MG tablet Take 1 tablet (50 mg total) by mouth 3 (three) times daily. 90 tablet 0   losartan (COZAAR) 100 MG tablet Take 1 tablet (100 mg total) by mouth daily. 90 tablet 1   pioglitazone (ACTOS) 30 MG tablet Take 1 tablet (30 mg total) by mouth daily. 90 tablet 0   No current facility-administered medications on file prior to visit.    ALLERGIES:  Allergies  Allergen Reactions   Codeine Other (See Comments)    Unknown reaction, severe headach   Metformin And Related Diarrhea     PHYSICAL EXAM:  Vitals:   11/16/22 1249  BP: 109/69  Pulse: 80  Resp: 18  Temp: 97.9 F (36.6 C)  SpO2: 100%   Filed Weights   11/16/22 1249  Weight: 159 lb 14.4 oz (72.5 kg)    General: Well-nourished, well-appearing male in no acute distress.  Unaccompanied today.  HEENT: Head is atraumatic and normocephalic.  Pupils equal and reactive to light.  Lymph: No preauricular,  postauricular, cervical, supraclavicular, or infraclavicular lymphadenopathy noted on palpation.   Cardiovascular: Normal rate and rhythm. Respiratory: Clear to auscultation bilaterally.  GI: Abdomen soft and round. Non-tender, non-distended. Bowel sounds normoactive.  GU: Deferred.   Neuro: No focal deficits. Steady gait.   Psych: Normal mood and affect for situation. Extremities: No edema.  Skin: Warm and dry.   Wt Readings from Last 3 Encounters:  11/16/22 159 lb 14.4 oz (72.5 kg)  11/16/22 158 lb (71.7 kg)  11/06/22 169 lb (76.7 kg)     LABORATORY DATA:  None at this visit.  DIAGNOSTIC IMAGING:  None at this visit.    ASSESSMENT & PLAN:  Mr. Drudge is a pleasant 67 y.o. male with history of squamous cell at the base of his tongue diagnosed in January 2022 status post concurrent chemoradiation now presents with recurrent squamous cell carcinoma, it is unclear if this is metastatic squamous cell carcinoma versus primary lung cancer.  P16 test has been ordered on the cytology specimen, should be resulted on Monday.   Lung Nodule  Biopsy revealed squamous cell carcinoma. Unclear if this is metastasis from previous head and neck cancer or a new primary lung cancer. No current symptoms reported. -Request HPV testing on biopsy sample to determine if it is related to previous head and neck cancer. -If it is metastatic from the head and neck cancer, proceed with radiation therapy alone. -If it is a new primary lung cancer, consider sequential chemo followed by radiation, given patient's previous difficulty tolerating concurrent chemo and radiation.  Previous Head and Neck Cancer HPV-related, previously treated with radiation and limited chemotherapy (4 cycles of carboplatin and 1 cycle of cisplatin) due to kidney function and blood count recovery. -Continue monitoring.  Kidney Function Previous impairment due to chemotherapy. Current status slightly improved but not fully  recovered. -Continue monitoring.  Nutrition/Weight Patient reports not eating as well as he used to and loss of muscle mass. -Encourage use of nutritional supplements like Boost or Ensure to improve strength and nutrition.  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 -Continue current management.  Follow-up No current follow-up appointment scheduled. -Will schedule follow-up appointment after discussing with pathologist and Dr. [name] and determining treatment plan.    *Total Encounter Time as defined by the Centers for Medicare and Medicaid Services includes, in addition to the face-to-face time of a patient visit (documented in the note above) non-face-to-face time: obtaining and reviewing outside history, ordering and reviewing medications, tests or procedures, care coordination (communications with other health care professionals or caregivers) and documentation in the medical record.

## 2022-11-18 DIAGNOSIS — C771 Secondary and unspecified malignant neoplasm of intrathoracic lymph nodes: Secondary | ICD-10-CM | POA: Insufficient documentation

## 2022-11-19 ENCOUNTER — Encounter (HOSPITAL_COMMUNITY): Payer: Self-pay

## 2022-11-20 NOTE — Progress Notes (Unsigned)
Palliative Medicine [facility]  Telephone:([phone] Fax:([phone]) [phone]   Name: Brian [name] Date: 11/20/2022 MRN: [id number]  DOB: 1955/05/21  Patient Care Team: [name], MD as PCP - General (Family Medicine) Rollene Rotunda, MD as PCP - Cardiology (Cardiology) [name], Lise Auer, RN as Oncology Nurse Navigator Lonie Peak, MD as Consulting Physician (Radiation Oncology) Rachel Moulds, MD as Consulting Physician (Hematology and Oncology) Anabel Bene, RD as Dietitian (Nutrition) Sundra Aland Karie Georges, [name] as Speech Language Pathologist (Speech Pathology) Jeanella Craze, Remi Deter, PT as Physical Therapist (Physical Therapy) Sallee Lange, [name] (Inactive) as Social Worker (General Practice) Newman Pies, MD as Consulting Physician (Otolaryngology) Earlene Plater, Rita Ohara, [facility] (Inactive) as Pharmacist (Pharmacist) Rollene Rotunda, MD as Consulting Physician (Cardiology)    REASON FOR CONSULTATION: Brian [name] is a 67 y.o. 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 Previous history also includes CKD, a-fib, type 2 diabetes, HLD, heart failure, and a history of seizures. Palliative ask to see for symptom management and goals of care.    SOCIAL HISTORY:     reports that he has never smoked. He has never been exposed to tobacco smoke. He has never used smokeless tobacco. He reports current alcohol use. He reports that he does not currently use drugs after having used the following drugs: Marijuana. Frequency: 7.00 times per week.  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 [name], laser therapy OD   Seizures (HCC)    pt reports this is from low blood sugar    PAST SURGICAL HISTORY:  Past Surgical History:  Procedure Laterality Date   BRONCHIAL NEEDLE ASPIRATION BIOPSY  10/30/2022   Procedure: BRONCHIAL NEEDLE ASPIRATION BIOPSIES;  Surgeon: Josephine Igo, DO;  Location: [facility] ENDOSCOPY;  Service: Cardiopulmonary;;   COLONOSCOPY     DIRECT LARYNGOSCOPY N/A 02/10/2020   Procedure: DIRECT LARYNGOSCOPY WITH BIOPSY;  Surgeon: Newman Pies, MD;  Location: [facility] OR;  Service: ENT;  Laterality: N/A;   Ear surgery as a child     IR GASTROSTOMY TUBE MOD SED  03/23/2020   IR GASTROSTOMY TUBE REMOVAL  06/01/2020   IR IMAGING GUIDED PORT INSERTION  03/09/2020   IR REMOVAL TUN ACCESS W/ PORT W/O FL MOD SED  02/08/2021   THORACENTESIS  10/30/2022   Procedure: Alanson Puls;  Surgeon: Josephine Igo, DO;  Location: [facility] ENDOSCOPY;  Service: Cardiopulmonary;;  L pleural effusion   TONSILLECTOMY     VIDEO BRONCHOSCOPY Left 10/30/2022   Procedure: VIDEO BRONCHOSCOPY WITHOUT FLUORO;  Surgeon: Josephine Igo, DO;  Location: [facility] ENDOSCOPY;  Service: Cardiopulmonary;  Laterality: Left;   VIDEO BRONCHOSCOPY WITH ENDOBRONCHIAL ULTRASOUND Bilateral 10/30/2022   Procedure: VIDEO BRONCHOSCOPY WITH ENDOBRONCHIAL ULTRASOUND;  Surgeon: Josephine Igo, DO;  Location: [facility] ENDOSCOPY;  Service: Cardiopulmonary;  Laterality: Bilateral;    HEMATOLOGY/ONCOLOGY HISTORY:  Oncology History Overview Note  Brian [name] is a 67 y.o. male who  presented two-week history of facial swelling in late November/early December 2021. Subsequently, the patient saw Dr. Suszanne Conners, who performed a direct laryngoscopy with biopsy on the date of 02/10/2020. Final pathology revealed: squamous cell carcinoma, P16 positive   He had the following imaging performed  1. Soft tissue neck CT scan performed  on 01/28/2020 revealing right glossotonsillar malignancy with bulky bilateral nodal disease. Nodal masses sowed definite extracapsular spread. There was also noted to be incidental advanced cervical spine degeneration with cord compression.  2. PET scan performed on 02/26/2020 revealing a hypermetabolic lesion at the right base of the tongue and right tonsil, consistent with oropharyngeal carcinoma. Carcinoma appeared to be confined to the mucosal surface. There were also noted to bulky bilateral hypermetabolic level II metastatic lymph nodes and smaller inferior hypermetabolic level II metastatic lymph nodes. There was no evidence of metastatic disease in the chest, abdomen, or pelvis. The two small right middle lobe pulmonary nodules present on CT from 2012 were consistent with benign etiology.  Started radiation on 03/14/2020 Chemo delayed by a week due to GFR and infusion issues. Started chemotherapy on 03/21/2020 C2 delayed because of AKI Now switched to weekly carboplatin given ongoing AKI C1 of weekly carboplatin on 04/04/2020 C2 of weekly carboplatin on 04/11/2020 C3 of weekly carboplatin on 04/18/2020 C4 Weekly carboplatin on 04/25/2020 Last planned cycle omitted.   Malignant neoplasm of base of tongue (HCC)  03/01/2020 Cancer Staging   Staging form: Pharynx - HPV-Mediated Oropharynx, AJCC 8th Edition - Clinical stage from 03/01/2020: Stage III (cT2, cN3, cM0, p16+) - Signed by Lonie Peak, MD on 03/02/2020   03/02/2020 Initial Diagnosis   Malignant neoplasm of base of tongue (HCC)   03/21/2020 - 03/28/2020 Chemotherapy         04/04/2020 - 04/25/2020 Chemotherapy   Completed chemotherapy on 04/25/2020 Patient is on Treatment Plan : HEAD/NECK Carboplatin q7d        ALLERGIES:  is allergic to codeine and metformin and related.  MEDICATIONS:  Current Outpatient Medications  Medication Sig Dispense Refill   apixaban (ELIQUIS) 5 MG TABS tablet Take 1 tablet (5 mg total) by mouth 2 (two)  times daily. 60 tablet 5   atorvastatin (LIPITOR) 20 MG tablet Take 1 tablet (20 mg total) by mouth daily. 90 tablet 3   Blood Glucose Monitoring Suppl (ONETOUCH VERIO IQ SYSTEM) w/Device KIT 1 each by Does not apply route 2 (two) times daily. 1 kit 0   carvedilol (COREG) 25 MG tablet Take 1 tablet (25 mg total) by mouth 2 (two) times daily. 180 tablet 3   Continuous Blood Gluc Sensor (FREESTYLE LIBRE 2 SENSOR) MISC Use as directed to monitor blood glucose continuously. Change sensor Q 14 days. DX E11.65 2 each 11   diltiazem (CARDIZEM CD) 240 MG 24 hr capsule Take 1 capsule (240 mg total) by mouth daily. 90 capsule 1   doxazosin (CARDURA) 2 MG tablet TAKE 1 TABLET BY MOUTH EVERY DAY 90 tablet 1   empagliflozin (JARDIANCE) 25 MG TABS tablet Take 1 tablet (25 mg total) by mouth daily before breakfast. 90 tablet 1   ferrous sulfate 325 (65 FE) MG EC tablet Take 325 mg by mouth 3 (three) times daily with meals. (Patient not taking: Reported on 11/16/2022)     furosemide (LASIX) 20 MG tablet Take 1 tablet (20 mg total) by mouth daily. 30 tablet 3   glucose blood test strip 1 each by Other route as needed for other. Use as instructed 100 each 11  hydrALAZINE (APRESOLINE) 50 MG tablet Take 1 tablet (50 mg total) by mouth 3 (three) times daily. 90 tablet 0   losartan (COZAAR) 100 MG tablet Take 1 tablet (100 mg total) by mouth daily. 90 tablet 1   pioglitazone (ACTOS) 30 MG tablet Take 1 tablet (30 mg total) by mouth daily. 90 tablet 0   No current facility-administered medications for this visit.    VITAL SIGNS: There were no vitals taken for this visit. There were no vitals filed for this visit.  Estimated body mass index is 23.61 kg/m as calculated from the following:   Height as of 11/16/22: 5\' 9"  (1.753 m).   Weight as of 11/16/22: 159 lb 14.4 oz (72.5 kg).  LABS: CBC:    Component Value Date/Time   WBC 5.4 10/16/2022 1145   HGB 10.6 (L) 10/16/2022 1145   HGB 11.9 (L) 09/16/2020 1540    HCT 32.4 (L) 10/16/2022 1145   PLT 218 10/16/2022 1145   PLT 161 09/16/2020 1540   MCV 89.8 10/16/2022 1145   NEUTROABS 4,655 10/16/2022 1145   LYMPHSABS 308 (L) 10/16/2022 1145   MONOABS 0.3 11/09/2021 0726   EOSABS 11 (L) 10/16/2022 1145   BASOSABS 11 10/16/2022 1145   Comprehensive Metabolic Panel:    Component Value Date/Time   NA 138 11/06/2022 1436   NA 139 09/01/2021 1014   K 4.2 11/06/2022 1436   CL 102 11/06/2022 1436   CO2 29 11/06/2022 1436   BUN 32 (H) 11/06/2022 1436   BUN 17 09/01/2021 1014   CREATININE 2.23 (H) 11/06/2022 1436   GLUCOSE 105 (H) 11/06/2022 1436   CALCIUM 8.9 11/06/2022 1436   AST 16 10/16/2022 1145   AST 14 (L) 03/20/2021 0909   ALT 11 10/16/2022 1145   ALT 13 03/20/2021 0909   ALKPHOS 65 11/03/2021 0821   BILITOT 0.6 10/16/2022 1145   BILITOT 0.3 03/20/2021 0909   PROT 6.7 10/16/2022 1145   ALBUMIN 3.3 (L) 11/03/2021 0821    RADIOGRAPHIC STUDIES: DG Chest Port 1 View  Result Date: 10/30/2022 CLINICAL DATA:  Post bronchoscopy EXAM: PORTABLE CHEST 1 VIEW COMPARISON:  PET-CT-09/10/2022; chest CT-07/25/2022 11/03/2021 FINDINGS: Grossly unchanged enlarged cardiac silhouette and mediastinal contours with obscuration the left heart border secondary to left-sided pleural effusion and associated left basilar consolidative opacities and volume loss. Atherosclerotic plaque within the thoracic aorta. No evidence of pneumothorax post reported history of recent bronchoscopy. Minimal right infrahilar heterogeneous opacities favored atelectasis. No right-sided pleural effusion. No evidence of edema. No acute osseous abnormalities. IMPRESSION: 1. No evidence of pneumothorax post reported history of recent bronchoscopy. 2. Unchanged left-sided pleural effusion and associated left basilar consolidative opacities and volume loss. Electronically Signed   By: Simonne Come M.D.   On: 10/30/2022 12:09    PERFORMANCE STATUS (ECOG) : {CHL ONC ECOG ZO:[id number]}  Review  of Systems Unless otherwise noted, a complete review of systems is negative.  Physical Exam General: NAD Cardiovascular: regular rate and rhythm Pulmonary: clear ant fields Abdomen: soft, nontender, + bowel sounds Extremities: no edema, no joint deformities Skin: no rashes Neurological:  IMPRESSION: *** I introduced myself, Maygan RN, and Palliative's role in collaboration with the oncology team. Concept of Palliative Care was introduced as specialized medical care for people and their families living with serious illness.  It focuses on providing relief from the symptoms and stress of a serious illness.  The goal is to improve quality of life for both the patient and the family. Values and goals  of care important to patient and family were attempted to be elicited.    We discussed *** current illness and what it means in the larger context of *** on-going co-morbidities. Natural disease trajectory and expectations were discussed.  I discussed the importance of continued conversation with family and their medical providers regarding overall plan of care and treatment options, ensuring decisions are within the context of the patients values and GOCs.  PLAN: Established therapeutic relationship. Education provided on palliative's role in collaboration with their Oncology/Radiation team. I will plan to see patient back in 2-4 weeks in collaboration to other oncology appointments.    Patient expressed understanding and was in agreement with this plan. He also understands that He can call the clinic at any time with any questions, concerns, or complaints.   Thank you for your referral and allowing Palliative to assist in Mr. Kenay Conn Bova's care.   Number and complexity of problems addressed: ***HIGH - 1 or more chronic illnesses with SEVERE exacerbation, progression, or side effects of treatment - advanced cancer, pain. Any controlled substances utilized were prescribed in the context  of palliative care.   Visit consisted of counseling and education dealing with the complex and emotionally intense issues of symptom management and palliative care in the setting of serious and potentially life-threatening illness.Greater than 50%  of this time was spent counseling and coordinating care related to the above assessment and plan.  Signed by: Willette Alma, AGPCNP-BC Palliative Medicine Team/ Cancer Center   *Please note that this is a verbal dictation therefore any spelling or grammatical errors are due to the "[employer] One" system interpretation.

## 2022-11-22 ENCOUNTER — Inpatient Hospital Stay: Payer: Medicare HMO | Attending: Hematology and Oncology | Admitting: Nurse Practitioner

## 2022-11-22 ENCOUNTER — Other Ambulatory Visit: Payer: Self-pay

## 2022-11-22 ENCOUNTER — Encounter: Payer: Self-pay | Admitting: Nurse Practitioner

## 2022-11-22 VITALS — BP 126/88 | HR 82 | Temp 97.6°F | Resp 16 | Ht 69.0 in | Wt 158.5 lb

## 2022-11-22 DIAGNOSIS — Z85810 Personal history of malignant neoplasm of tongue: Secondary | ICD-10-CM | POA: Insufficient documentation

## 2022-11-22 DIAGNOSIS — Z515 Encounter for palliative care: Secondary | ICD-10-CM

## 2022-11-22 DIAGNOSIS — C3432 Malignant neoplasm of lower lobe, left bronchus or lung: Secondary | ICD-10-CM | POA: Diagnosis not present

## 2022-11-22 DIAGNOSIS — R630 Anorexia: Secondary | ICD-10-CM | POA: Diagnosis not present

## 2022-11-22 DIAGNOSIS — R530 Neoplastic (malignant) related fatigue: Secondary | ICD-10-CM | POA: Diagnosis not present

## 2022-11-22 DIAGNOSIS — Z7189 Other specified counseling: Secondary | ICD-10-CM | POA: Diagnosis not present

## 2022-11-29 ENCOUNTER — Inpatient Hospital Stay: Payer: Medicare HMO | Admitting: Hematology and Oncology

## 2022-11-29 ENCOUNTER — Other Ambulatory Visit: Payer: Medicare HMO

## 2022-11-29 VITALS — BP 146/95 | HR 92 | Temp 97.9°F | Resp 18 | Wt 164.4 lb

## 2022-11-29 DIAGNOSIS — Z85810 Personal history of malignant neoplasm of tongue: Secondary | ICD-10-CM | POA: Insufficient documentation

## 2022-11-29 DIAGNOSIS — C3432 Malignant neoplasm of lower lobe, left bronchus or lung: Secondary | ICD-10-CM | POA: Diagnosis not present

## 2022-11-29 DIAGNOSIS — I5020 Unspecified systolic (congestive) heart failure: Secondary | ICD-10-CM | POA: Diagnosis not present

## 2022-11-29 DIAGNOSIS — I480 Paroxysmal atrial fibrillation: Secondary | ICD-10-CM | POA: Diagnosis not present

## 2022-11-29 DIAGNOSIS — I10 Essential (primary) hypertension: Secondary | ICD-10-CM | POA: Diagnosis not present

## 2022-11-29 DIAGNOSIS — C01 Malignant neoplasm of base of tongue: Secondary | ICD-10-CM

## 2022-11-29 DIAGNOSIS — C3492 Malignant neoplasm of unspecified part of left bronchus or lung: Secondary | ICD-10-CM | POA: Diagnosis not present

## 2022-11-29 DIAGNOSIS — N1832 Chronic kidney disease, stage 3b: Secondary | ICD-10-CM | POA: Diagnosis not present

## 2022-11-29 DIAGNOSIS — E1169 Type 2 diabetes mellitus with other specified complication: Secondary | ICD-10-CM | POA: Diagnosis not present

## 2022-11-29 NOTE — Progress Notes (Signed)
REASON FOR VISIT:  Routine follow-up for history of head & neck cancer.  BRIEF ONCOLOGIC HISTORY:  Oncology History Overview Note  Brian [name] is a 67 y.o. male who presented two-week history of facial swelling in late November/early December 2021. Subsequently, the patient saw Dr. Suszanne Conners, who performed a direct laryngoscopy with biopsy on the date of 02/10/2020. Final pathology revealed: squamous cell carcinoma, P16 positive   He had the following imaging performed  1. Soft tissue neck CT scan performed on 01/28/2020 revealing right glossotonsillar malignancy with bulky bilateral nodal disease. Nodal masses sowed definite extracapsular spread. There was also noted to be incidental advanced cervical spine degeneration with cord compression.  2. PET scan performed on 02/26/2020 revealing a hypermetabolic lesion at the right base of the tongue and right tonsil, consistent with oropharyngeal carcinoma. Carcinoma appeared to be confined to the mucosal surface. There were also noted to bulky bilateral hypermetabolic level II metastatic lymph nodes and smaller inferior hypermetabolic level II metastatic lymph nodes. There was no evidence of metastatic disease in the chest, abdomen, or pelvis. The two small right middle lobe pulmonary nodules present on CT from 2012 were consistent with benign etiology.  Started radiation on 03/14/2020 Chemo delayed by a week due to GFR and infusion issues. Started chemotherapy on 03/21/2020 C2 delayed because of AKI Now switched to weekly carboplatin given ongoing AKI C1 of weekly carboplatin on 04/04/2020 C2 of weekly carboplatin on 04/11/2020 C3 of weekly carboplatin on 04/18/2020 C4 Weekly carboplatin on 04/25/2020 Last planned cycle omitted.   Malignant neoplasm of base of tongue (HCC)  03/01/2020 Cancer Staging   Staging form: Pharynx - HPV-Mediated Oropharynx, AJCC 8th Edition - Clinical stage from 03/01/2020: Stage III (cT2, cN3, cM0, p16+) - Signed by  Lonie Peak, MD on 03/02/2020   03/02/2020 Initial Diagnosis   Malignant neoplasm of base of tongue (HCC)   03/21/2020 - 03/28/2020 Chemotherapy         04/04/2020 - 04/25/2020 Chemotherapy   Completed chemotherapy on 04/25/2020 Patient is on Treatment Plan : HEAD/NECK Carboplatin q7d      Primary cancer of left lower lobe of lung (HCC)  06/13/2021 Initial Diagnosis   Primary cancer of left lower lobe of lung (HCC)   07/25/2022 Imaging   CT chest and CT soft tissue which showed increased size of left infrahilar lymph node and newly enlarged mediastinal lymph nodes concerning for metastatic disease new near complete occlusion of proximal left lower lobe bronchus with evidence of endobronchial soft tissue likely due to aspiration although endobronchial lesion cannot be excluded.  Increased linear opacities of left lung base.  CT neck showed no evidence of recurrent disease.  He had endobronchial ultrasound and fine-needle aspiration of 4R lymph node which confirmed presence of squamous cell carcinoma.  There were atypical cells noted on left lower lobe mass as well as 4L lymph node.     09/10/2022 PET scan   Narrative & Impression  CLINICAL DATA:  Subsequent treatment strategy for lung nodule, lung cancer.   EXAM: NUCLEAR MEDICINE PET SKULL BASE TO THIGH   TECHNIQUE: 8.0 mCi F-18 FDG was injected intravenously. Full-ring PET imaging was performed from the skull base to thigh after the radiotracer. CT data was obtained and used for attenuation correction and anatomic localization.   Fasting blood glucose: 98 mg/dl   COMPARISON:  CT chest 07/25/2022 and PET 06/08/2021.   FINDINGS: Mediastinal blood pool activity: SUV max 2.2   Liver activity: SUV max NA  NECK:   No abnormal hypermetabolism.   Incidental CT findings:   None.   CHEST:   Hypermetabolic bilateral mediastinal adenopathy. Index low right paratracheal lymph node measures 1.8 cm (4/72), SUV max 14.2. Hypermetabolic  left hilar lymph node measures approximately 2.2 cm (4/82), SUV max 13.9. No additional abnormal hypermetabolism.   Incidental CT findings:   Atherosclerotic calcification of the aorta, aortic valve and coronary arteries. Heart is enlarged. Small pericardial effusion. Moderate left pleural effusion, increased in size from 07/25/2022, with collapse/consolidation in the left lower lobe. Respiratory motion degrades image quality. Trace right pleural fluid.   ABDOMEN/PELVIS:   No abnormal hypermetabolism.   Incidental CT findings:   Liver, gallbladder, adrenal glands, kidneys, spleen, pancreas, stomach and bowel are grossly unremarkable.   SKELETON:   No abnormal hypermetabolism.   Incidental CT findings:   Degenerative changes in the spine.   IMPRESSION: 1. Hypermetabolic mediastinal and left hilar adenopathy, consistent with disease recurrence. 2. Moderate left pleural effusion, increased in size from 07/25/2022, with collapse/consolidation in the left lower lobe, possibly postobstructive in etiology. 3. Small pericardial effusion. 4. Trace right pleural fluid. 5. Aortic atherosclerosis (ICD10-I70.0). Coronary artery calcification.        Pathology Results   LLL MASS, FINE NEEDLE ASPIRATION:  - Atypical cells present   LYMPH NODE, 4L, FINE NEEDLE ASPIRATION:  - Atypical cells present   LYMPH NODE, 4R, FINE NEEDLE ASPIRATION:    FINAL MICROSCOPIC DIAGNOSIS:  - Malignant cells present  - Squamous cell carcinoma  - See comment   PLEURAL FLUID, LEFT, THORACENTESIS:    FINAL MICROSCOPIC DIAGNOSIS:  - Reactive mesothelial cells present     INTERVAL HISTORY:   Patient is here for follow up. We are still waiting on his HPV testing. Apparently the block was sent [facility] and just was received back. He denies any complaints at all. No cough, chest pain, SOB, hemoptysis. No change in bowel habits or urinary habits. No falls. He is able to maintain his  weight.   CURRENT MEDICATIONS:  Current Outpatient Medications on File Prior to Visit  Medication Sig Dispense Refill   apixaban (ELIQUIS) 5 MG TABS tablet Take 1 tablet (5 mg total) by mouth 2 (two) times daily. 60 tablet 5   atorvastatin (LIPITOR) 20 MG tablet Take 1 tablet (20 mg total) by mouth daily. 90 tablet 3   Blood Glucose Monitoring Suppl (ONETOUCH VERIO IQ SYSTEM) w/Device KIT 1 each by Does not apply route 2 (two) times daily. 1 kit 0   carvedilol (COREG) 25 MG tablet Take 1 tablet (25 mg total) by mouth 2 (two) times daily. 180 tablet 3   Continuous Blood Gluc Sensor (FREESTYLE LIBRE 2 SENSOR) MISC Use as directed to monitor blood glucose continuously. Change sensor Q 14 days. DX E11.65 2 each 11   diltiazem (CARDIZEM CD) 240 MG 24 hr capsule Take 1 capsule (240 mg total) by mouth daily. 90 capsule 1   doxazosin (CARDURA) 2 MG tablet TAKE 1 TABLET BY MOUTH EVERY DAY 90 tablet 1   empagliflozin (JARDIANCE) 25 MG TABS tablet Take 1 tablet (25 mg total) by mouth daily before breakfast. 90 tablet 1   ferrous sulfate 325 (65 FE) MG EC tablet Take 325 mg by mouth 3 (three) times daily with meals. (Patient not taking: Reported on 11/16/2022)     furosemide (LASIX) 20 MG tablet Take 1 tablet (20 mg total) by mouth daily. 30 tablet 3   glucose blood test strip 1 each by  Other route as needed for other. Use as instructed 100 each 11   hydrALAZINE (APRESOLINE) 50 MG tablet Take 1 tablet (50 mg total) by mouth 3 (three) times daily. 90 tablet 0   losartan (COZAAR) 100 MG tablet Take 1 tablet (100 mg total) by mouth daily. 90 tablet 1   pioglitazone (ACTOS) 30 MG tablet Take 1 tablet (30 mg total) by mouth daily. 90 tablet 0   No current facility-administered medications on file prior to visit.    ALLERGIES:  Allergies  Allergen Reactions   Codeine Other (See Comments)    Unknown reaction, severe headach   Metformin And Related Diarrhea     PHYSICAL EXAM:  Vitals:   11/29/22 1159   BP: (!) 146/95  Pulse: 92  Resp: 18  Temp: 97.9 F (36.6 C)  SpO2: 98%    Filed Weights   11/29/22 1159  Weight: 164 lb 6.4 oz (74.6 kg)     General: Well-nourished, well-appearing male in no acute distress.  Unaccompanied today.  HEENT: Head is atraumatic and normocephalic.  Pupils equal and reactive to light. Skin is thickened,there is scab on the left side of the neck Lymph: No preauricular, postauricular, cervical, supraclavicular, adenopathy.   Cardiovascular: Normal rate and rhythm. Respiratory: Clear to auscultation bilaterally.  GI: Abdomen soft and round. Non-tender, non-distended. Bowel sounds normoactive.  GU: Deferred.   Neuro: Left sided weakness Psych: Normal mood and affect for situation. Extremities: No edema.  Skin: Warm and dry.   Wt Readings from Last 3 Encounters:  11/29/22 164 lb 6.4 oz (74.6 kg)  11/22/22 158 lb 8 oz (71.9 kg)  11/16/22 158 lb (71.7 kg)     LABORATORY DATA:  None at this visit.  DIAGNOSTIC IMAGING:  None at this visit.    ASSESSMENT & PLAN:  Mr. Ledwith is a pleasant 67 y.o. male with history of squamous cell at the base of his tongue diagnosed in January 2022 status post concurrent chemoradiation now presents with recurrent squamous cell carcinoma, it is unclear if this is metastatic squamous cell carcinoma versus primary lung cancer.  P16 test has been ordered on the cytology specimen, should be resulted in a couple days. There was a delay because the specimen was sent to [facility].  Lung Nodule Biopsy revealed squamous cell carcinoma. Unclear if this is metastasis from previous head and neck cancer or a new primary lung cancer. No current symptoms reported. -Request HPV testing on biopsy sample to determine if it is related to previous head and neck cancer. -If it is metastatic from the head and neck cancer, proceed with radiation therapy alone. -If it is a new primary lung cancer, initially plan was to consider  sequential chemoradiation but patient is leaning towards rad alone at this point We will also repeat staging because his last scans were almost 2 months ago.  Previous Head and Neck Cancer HPV-related, previously treated with radiation and limited chemotherapy (4 cycles of carboplatin and 1 cycle of cisplatin) due to kidney function and blood count recovery. -Continue monitoring.  Kidney Function Previous impairment due to chemotherapy.  Continue monitoring.  Nutrition/Weight Patient reports maintaining his weight. -Encourage use of nutritional supplements like Boost or Ensure to improve strength and nutrition.  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 -Continue current management.  Follow-up FU to be scheduled.  Total time spent: 20 min  *Total Encounter Time as defined by the Centers for Medicare and Medicaid Services includes, in addition to  the face-to-face time of a patient visit (documented in the note above) non-face-to-face time: obtaining and reviewing outside history, ordering and reviewing medications, tests or procedures, care coordination (communications with other health care professionals or caregivers) and documentation in the medical record.

## 2022-11-30 LAB — BASIC METABOLIC PANEL
BUN/Creatinine Ratio: 11 (calc) (ref 6–22)
BUN: 19 mg/dL (ref 7–25)
CO2: 24 mmol/L (ref 20–32)
Calcium: 8.4 mg/dL — ABNORMAL LOW (ref 8.6–10.3)
Chloride: 105 mmol/L (ref 98–110)
Creat: 1.7 mg/dL — ABNORMAL HIGH (ref 0.70–1.35)
Glucose, Bld: 168 mg/dL — ABNORMAL HIGH (ref 65–99)
Potassium: 3.6 mmol/L (ref 3.5–5.3)
Sodium: 139 mmol/L (ref 135–146)

## 2022-12-04 LAB — CYTOLOGY - NON PAP

## 2022-12-05 ENCOUNTER — Telehealth: Payer: Self-pay | Admitting: Radiation Oncology

## 2022-12-05 NOTE — Telephone Encounter (Addendum)
10/6 @ 1:15 pm Called and spoke to patient about getting schedule for follow up consult with Dr. [name].  Patient declined any appointments at this time due to transportation issues.

## 2022-12-05 NOTE — Telephone Encounter (Signed)
10/16 @ 2:15 pm Follow-up call to patient's son, he stated to schedule patient's appointments (He will see on mychart) and provide transportation for all his father's appointments.

## 2022-12-06 NOTE — Progress Notes (Addendum)
Histology and Location of Primary Cancer:  02-10-20 Clinical History: throat cancer (cm)   FINAL MICROSCOPIC DIAGNOSIS:   A. OROPHARYNGEAL, RIGHT, BIOPSY:  -  Squamous cell carcinoma  -  See comment   COMMENT:   P16 immunohistochemistry is pending and will be reported in an addendum.  Dr. Berneice Heinrich reviewed the case and agrees with the above diagnosis.  Dr.  Suszanne Conners was notified of these results on February 11, 2020.   Location(s) of Symptomatic tumor(s):  CT CHEST ABDOMEN PELVIS W CONTRAST 12-10-22 IMPRESSION: 1. Progressive mediastinal and hilar lymphadenopathy. 2. Persistent obstruction of the left lower lobe bronchus with complete left lower lobe atelectasis/collapse and surrounding pleural effusion. 3. Stable small right middle lobe pulmonary nodules. 4. No findings for abdominal/pelvic metastatic disease.  10-30-22 Clinical History: Lung mass, effusion  Specimen Submitted:  A. LYMPH NODE, 4R, FINE NEEDLE ASPIRATION:    FINAL MICROSCOPIC DIAGNOSIS:  - Malignant cells present  - Squamous cell carcinoma  - See comment    CT Chest Wo Contrast 07/25/2022  IMPRESSION: 1. Increased size of left infrahilar lymph node and new enlarged mediastinal lymph nodes, concerning for metastatic disease. 2. New near-complete occlusion of the proximal left lower lobe bronchus with evidence of endobronchial soft tissue, likely due to aspiration, although endobronchial lesion can not be excluded. Recommend attention on follow-up. 3. Increased linear opacities of the left lung base, likely combination of postradiation change and atelectasis. 4. Small left pleural effusion, increased in size when compared with the prior. 5. Aortic Atherosclerosis (ICD10-I70.0).  NM PET Image Initial (PI) Skull Base To Thigh 09/10/2022  IMPRESSION: 1. Hypermetabolic mediastinal and left hilar adenopathy, consistent with disease recurrence. 2. Moderate left pleural effusion, increased in size  from 07/25/2022, with collapse/consolidation in the left lower lobe, possibly postobstructive in etiology. 3. Small pericardial effusion. 4. Trace right pleural fluid. 5. Aortic atherosclerosis (ICD10-I70.0). Coronary artery calcification.  Past/Anticipated chemotherapy by medical oncology, if any:  Rachel Moulds, MD 11/29/2022  Oncology History Overview Note   Brian [name] is a 67 y.o. male who presented two-week history of facial swelling in late November/early December 2021. Subsequently, the patient saw Dr. Suszanne Conners, who performed a direct laryngoscopy with biopsy on the date of 02/10/2020. Final pathology revealed: squamous cell carcinoma, P16 positive   He had the following imaging performed   1. Soft tissue neck CT scan performed on 01/28/2020 revealing right glossotonsillar malignancy with bulky bilateral nodal disease. Nodal masses sowed definite extracapsular spread. There was also noted to be incidental advanced cervical spine degeneration with cord compression.   2. PET scan performed on 02/26/2020 revealing a hypermetabolic lesion at the right base of the tongue and right tonsil, consistent with oropharyngeal carcinoma. Carcinoma appeared to be confined to the mucosal surface. There were also noted to bulky bilateral hypermetabolic level II metastatic lymph nodes and smaller inferior hypermetabolic level II metastatic lymph nodes. There was no evidence of metastatic disease in the chest, abdomen, or pelvis. The two small right middle lobe pulmonary nodules present on CT from 2012 were consistent with benign etiology.   Started radiation on 03/14/2020 Chemo delayed by a week due to GFR and infusion issues. Started chemotherapy on 03/21/2020 C2 delayed because of AKI Now switched to weekly carboplatin given ongoing AKI C1 of weekly carboplatin on 04/04/2020 C2 of weekly carboplatin on 04/11/2020 C3 of weekly carboplatin on 04/18/2020 C4 Weekly carboplatin on 04/25/2020 Last planned  cycle omitted.    Malignant neoplasm of base of tongue (HCC)  03/01/2020 Cancer Staging     Staging form: Pharynx - HPV-Mediated Oropharynx, AJCC 8th Edition - Clinical stage from 03/01/2020: Stage III (cT2, cN3, cM0, p16+) - Signed by Lonie Peak, MD on 03/02/2020     03/02/2020 Initial Diagnosis     Malignant neoplasm of base of tongue (HCC)     03/21/2020 - 03/28/2020 Chemotherapy               04/04/2020 - 04/25/2020 Chemotherapy     Completed chemotherapy on 04/25/2020 Patient is on Treatment Plan : HEAD/NECK Carboplatin q7d        Primary cancer of left lower lobe of lung (HCC)   06/13/2021 Initial Diagnosis     Primary cancer of left lower lobe of lung (HCC)     07/25/2022 Imaging     CT chest and CT soft tissue which showed increased size of left infrahilar lymph node and newly enlarged mediastinal lymph nodes concerning for metastatic disease new near complete occlusion of proximal left lower lobe bronchus with evidence of endobronchial soft tissue likely due to aspiration although endobronchial lesion cannot be excluded.  Increased linear opacities of left lung base.  CT neck showed no evidence of recurrent disease.  He had endobronchial ultrasound and fine-needle aspiration of 4R lymph node which confirmed presence of squamous cell carcinoma.  There were atypical cells noted on left lower lobe mass as well as 4L lymph node.       09/10/2022 PET scan     Narrative & Impression  CLINICAL DATA:  Subsequent treatment strategy for lung nodule, lung cancer.   EXAM: NUCLEAR MEDICINE PET SKULL BASE TO THIGH   TECHNIQUE: 8.0 mCi F-18 FDG was injected intravenously. Full-ring PET imaging was performed from the skull base to thigh after the radiotracer. CT data was obtained and used for attenuation correction and anatomic localization.   Fasting blood glucose: 98 mg/dl   COMPARISON:  CT chest 07/25/2022 and PET 06/08/2021.   FINDINGS: Mediastinal blood pool activity: SUV max 2.2    Liver activity: SUV max NA   NECK:   No abnormal hypermetabolism.   Incidental CT findings:   None.   CHEST:   Hypermetabolic bilateral mediastinal adenopathy. Index low right paratracheal lymph node measures 1.8 cm (4/72), SUV max 14.2. Hypermetabolic left hilar lymph node measures approximately 2.2 cm (4/82), SUV max 13.9. No additional abnormal hypermetabolism.   Incidental CT findings:   Atherosclerotic calcification of the aorta, aortic valve and coronary arteries. Heart is enlarged. Small pericardial effusion. Moderate left pleural effusion, increased in size from 07/25/2022, with collapse/consolidation in the left lower lobe. Respiratory motion degrades image quality. Trace right pleural fluid.   ABDOMEN/PELVIS:   No abnormal hypermetabolism.   Incidental CT findings:   Liver, gallbladder, adrenal glands, kidneys, spleen, pancreas, stomach and bowel are grossly unremarkable.   SKELETON:   No abnormal hypermetabolism.   Incidental CT findings:   Degenerative changes in the spine.   IMPRESSION: 1. Hypermetabolic mediastinal and left hilar adenopathy, consistent with disease recurrence. 2. Moderate left pleural effusion, increased in size from 07/25/2022, with collapse/consolidation in the left lower lobe, possibly postobstructive in etiology. 3. Small pericardial effusion. 4. Trace right pleural fluid. 5. Aortic atherosclerosis (ICD10-I70.0). Coronary artery calcification.            Pathology Results     LLL MASS, FINE NEEDLE ASPIRATION:  - Atypical cells present    LYMPH NODE, 4L, FINE NEEDLE ASPIRATION:  - Atypical cells present    LYMPH  NODE, 4R, FINE NEEDLE ASPIRATION:    FINAL MICROSCOPIC DIAGNOSIS:  - Malignant cells present  - Squamous cell carcinoma  - See comment    PLEURAL FLUID, LEFT, THORACENTESIS:    FINAL MICROSCOPIC DIAGNOSIS:  - Reactive mesothelial cells present       ASSESSMENT & PLAN:  [name] is a pleasant  67 y.o. male with history of squamous cell at the base of his tongue diagnosed in January 2022 status post concurrent chemoradiation now presents with recurrent squamous cell carcinoma, it is unclear if this is metastatic squamous cell carcinoma versus primary lung cancer.  P16 test has been ordered on the cytology specimen, should be resulted in a couple days. There was a delay because the specimen was sent to [facility].   Lung Nodule Biopsy revealed squamous cell carcinoma. Unclear if this is metastasis from previous head and neck cancer or a new primary lung cancer. No current symptoms reported. -Request HPV testing on biopsy sample to determine if it is related to previous head and neck cancer. -If it is metastatic from the head and neck cancer, proceed with radiation therapy alone. -If it is a new primary lung cancer, initially plan was to consider sequential chemoradiation but patient is leaning towards rad alone at this point We will also repeat staging because his last scans were almost 2 months ago.   Previous Head and Neck Cancer HPV-related, previously treated with radiation and limited chemotherapy (4 cycles of carboplatin and 1 cycle of cisplatin) due to kidney function and blood count recovery. -Continue monitoring.   Kidney Function Previous impairment due to chemotherapy.  Continue monitoring.   Nutrition/Weight Patient reports maintaining his weight. -Encourage use of nutritional supplements like Boost or Ensure to improve strength and nutrition.   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 -Continue current management.   Follow-up FU to be scheduled.  Patient's main complaints related to symptomatic tumor(s) are: found on imaging, pt has had pleural effusions   Pain on a scale of 0-10 is: denies pain  If Spine Met(s), symptoms, if any, include: Bowel/Bladder retention or incontinence (please describe): na Numbness or weakness in  extremities (please describe): na Current Decadron regimen, if applicable: na  Ambulatory status? Walker? Wheelchair?: uses cane  SAFETY ISSUES: Prior radiation? Yes, with Dr. [name] for head and neck cancer, also for lung as well in past Pacemaker/ICD? None  Possible current pregnancy? no Is the patient on methotrexate? no  Additional Complaints / other details:  Overall wants to know what plan of care would look like and is ready to get started. Pt denies respiratory symptoms but does report weight loss (though eating well).    Vitals:   12/14/22 1322  BP: (!) 159/96  Pulse: (!) 109  Resp: 20  Temp: (!) 97.3 F (36.3 C)  SpO2: 100%

## 2022-12-10 ENCOUNTER — Ambulatory Visit (HOSPITAL_COMMUNITY)
Admission: RE | Admit: 2022-12-10 | Discharge: 2022-12-10 | Disposition: A | Discharge status: Home / Self Care | Payer: Medicare HMO | Source: Ambulatory Visit | Attending: Hematology and Oncology | Admitting: Hematology and Oncology

## 2022-12-10 DIAGNOSIS — C3490 Malignant neoplasm of unspecified part of unspecified bronchus or lung: Secondary | ICD-10-CM | POA: Diagnosis not present

## 2022-12-10 DIAGNOSIS — R590 Localized enlarged lymph nodes: Secondary | ICD-10-CM | POA: Diagnosis not present

## 2022-12-10 DIAGNOSIS — J90 Pleural effusion, not elsewhere classified: Secondary | ICD-10-CM | POA: Diagnosis not present

## 2022-12-10 DIAGNOSIS — C3492 Malignant neoplasm of unspecified part of left bronchus or lung: Secondary | ICD-10-CM | POA: Diagnosis not present

## 2022-12-10 DIAGNOSIS — I700 Atherosclerosis of aorta: Secondary | ICD-10-CM | POA: Diagnosis not present

## 2022-12-10 DIAGNOSIS — C01 Malignant neoplasm of base of tongue: Secondary | ICD-10-CM | POA: Diagnosis not present

## 2022-12-10 MED ORDER — IOHEXOL 300 MG/ML  SOLN
75.0000 mL | Freq: Once | INTRAMUSCULAR | Status: AC | PRN
  Administered 2022-12-10: 75 mL via INTRAVENOUS

## 2022-12-10 MED ORDER — SODIUM CHLORIDE (PF) 0.9 % IJ SOLN
INTRAMUSCULAR | Status: AC
  Filled 2022-12-10: qty 50

## 2022-12-11 NOTE — Progress Notes (Signed)
Pathology discussed at last office visit   Thanks,  BLI  Josephine Igo, DO [facility] 12/11/2022 3:19 PM

## 2022-12-13 NOTE — Progress Notes (Signed)
Radiation Oncology         [phone] ________________________________  Outpatient Re-Consultation  Name: Brian [name] MRN: [id number]  Date: 12/14/2022  DOB: Jun 28, 1955  [id number], Priscille Heidelberg, MD  Rachel Moulds, MD   REFERRING PHYSICIAN: Rachel Moulds, MD  DIAGNOSIS:  C77.1   ICD-10-CM   1. 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 Malignant neoplasm of base of tongue (HCC)  C01     3. 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   Cancer Staging  Malignant neoplasm of base of tongue (HCC) Staging form: Pharynx - HPV-Mediated Oropharynx, AJCC 8th Edition - Clinical stage from 03/01/2020: Stage III (cT2, cN3, cM0, p16+) - Signed by Lonie Peak, MD on 03/02/2020 Stage prefix: Initial diagnosis   CHIEF COMPLAINT: Here to discuss management of lung  cancer  Narrative / Interval History 12/14/22 ::Brian [name] is a 67 y.o. male who presents today to further discuss the role of radiation therapy in management of his recently diagnosed squamous cell lung cancer .   To review, the patient was last seen here for re-consultation on 11/16/22. During which time, we discussed proceeding with definitive radiation for 6 weeks to the hypermetabolic mediastinal and left hilar adenopathy. However, given his unknown primary (new primary vs metastatic head and neck cancer), we discussed the need for further classification to help determine the indication/need for additional adjuvant treatment.   The patient accordingly met with Dr. [name] on 09/27 after our last visit who recommended HPV testing on the his biopsy  sample to determine if it is related to his previous head and neck cancer.   HPV testing was accordingly performed on his previous biopsy sample from lymph node 4R (collected on 10/30/22). Results came back showing a p16 positive status, consistent with recurrent HPV associated SCC.   Based on these results, concurrent chemoradiation is not indicated and he will proceed with radiation therapy alone.    HPI - Re-Consultation 11/16/22 ::Brian [name] is a 67 y.o. male who presents today for consideration of radiation therapy in management of his recently diagnosed squamous cell lung cancer. 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 He was last seen here on 02/06/22 for follow up of radiation to the LLL which he completed in May of 2023. His most recent chest CT from 12/15 was reviewed at that time showed an excellent response to SBRT in his lung with complete remission achieved. He has continued to follow with medical oncology since that time.     This past June, the patient presented for a follow-up chest CT on 07/25/22 which showed: a interval increase in a left infrahilar lymph node concerning for metastatic disease, and new near complete occlusion of the proximal left lower lobe bronchus with evidence of endobronchial soft tissue, likely due to aspiration (although an endobronchial lesion could not be excluded).  Other findings included increased linear opacities of the left lung base, likely representing a combination of postradiation changes and atelectasis, and an interval increase in  size of a small left pleural effusion.   A soft tissue neck CT without contrast was also performed on 06/05 which showed no evidence of recurrent disease and stable post treatment changes in the neck.    He was accordingly referred to Dr. [name] on 08/07/22 for further evaluation and management. 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    He did however agree to proceed with a PET scan on 09/10/22 which demonstrated: hypermetabolic mediastinal and left hilar adenopathy consistent with disease recurrence, and an increase in size of a moderate left pleural effusion with associated collapse/consolidation in the left lower lobe, possibly representing a postobstructive etiology. Other findings included a small pericardial effusion and a trace right pleural effusion.    After taking some time to reflect, the patient opted to proceed with bronchoscopy and biopsies (and left sided thoracentesis) on 10/30/22 under the care of Dr. [name]. FNA of lymph node 4R showed malignant cells present consistent with squamous cell carcinoma. FNA of lymph node 4L and a LLL mass (found during the procedure) also showed atypical cells present. Pleural fluid from thoracentesis was also sent for cytology and showed no evidence of malignancy.    He is scheduled to meet with Dr. [name] later this afternoon to discuss systemic treatment options (if indicated).    Of note: PD-L1 testing was obtained on the biopsy specimen and shows a TPS score of <1%.     PREVIOUS RADIATION THERAPY: Yes    Radiation Treatment Dates: 03/14/2020 through 05/04/2020 Site Technique Total Dose (Gy) Dose per Fx (Gy) Completed Fx Beam Energies  Neck: HN_BOT IMRT 70/70 2 35/35 6X    From 06/21/2021 to 07/06/2021 he received 60 Gray in 5 fractions, stereotactic body radiation therapy, to left lower lobe lung mass (lung mass was not biopsied)  PAST MEDICAL HISTORY:  has a past medical history of Arthritis, Atrial fibrillation (HCC), Cancer (HCC), Cardiomyopathy, Cerebrovascular accident (HCC), Diabetes mellitus, Hyperlipidemia, Hypertension, Left-sided weakness, 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 (HCC), and Seizures (HCC).    PAST SURGICAL HISTORY: Past Surgical History:   Procedure Laterality Date   BRONCHIAL NEEDLE ASPIRATION BIOPSY  10/30/2022   Procedure: BRONCHIAL NEEDLE ASPIRATION BIOPSIES;  Surgeon: Josephine Igo, DO;  Location: [facility];  Service: Cardiopulmonary;;   COLONOSCOPY     DIRECT LARYNGOSCOPY N/A 02/10/2020   Procedure: DIRECT LARYNGOSCOPY WITH BIOPSY;  Surgeon: Newman Pies, MD;  Location: [facility] OR;  Service: ENT;  Laterality: N/A;   Ear surgery as a child     IR GASTROSTOMY TUBE MOD SED  03/23/2020   IR GASTROSTOMY TUBE REMOVAL  06/01/2020   IR IMAGING GUIDED PORT INSERTION  03/09/2020   IR REMOVAL TUN ACCESS W/ PORT W/O FL MOD SED  02/08/2021   THORACENTESIS  10/30/2022   Procedure: Alanson Puls;  Surgeon: Josephine Igo, DO;  Location: [facility];  Service: Cardiopulmonary;;  L pleural effusion   TONSILLECTOMY     VIDEO BRONCHOSCOPY Left 10/30/2022   Procedure: VIDEO BRONCHOSCOPY WITHOUT FLUORO;  Surgeon: Josephine Igo, DO;  Location: [facility];  Service: Cardiopulmonary;  Laterality: Left;   VIDEO BRONCHOSCOPY WITH ENDOBRONCHIAL ULTRASOUND Bilateral 10/30/2022   Procedure: VIDEO BRONCHOSCOPY WITH ENDOBRONCHIAL ULTRASOUND;  Surgeon: Josephine Igo, DO;  Location: [facility];  Service: Cardiopulmonary;  Laterality: Bilateral;    FAMILY HISTORY: family history includes Breast cancer in his sister; Colon cancer in his  mother; Hyperlipidemia in his father; Hypertension in his father.  SOCIAL HISTORY:  reports that he has never smoked. He has never been exposed to tobacco smoke. He has never used smokeless tobacco. He reports current alcohol use. He reports that he does not currently use drugs after having used the following drugs: Marijuana. Frequency: 7.00 times per week.  ALLERGIES: Codeine and Metformin and related  MEDICATIONS:  Current Outpatient Medications  Medication Sig Dispense Refill   apixaban (ELIQUIS) 5 MG TABS tablet Take 1 tablet (5 mg total) by mouth 2 (two) times daily. 60 tablet 5   atorvastatin (LIPITOR)  20 MG tablet Take 1 tablet (20 mg total) by mouth daily. 90 tablet 3   Blood Glucose Monitoring Suppl (ONETOUCH VERIO IQ SYSTEM) w/Device KIT 1 each by Does not apply route 2 (two) times daily. 1 kit 0   carvedilol (COREG) 25 MG tablet Take 1 tablet (25 mg total) by mouth 2 (two) times daily. 180 tablet 3   Continuous Blood Gluc Sensor (FREESTYLE LIBRE 2 SENSOR) MISC Use as directed to monitor blood glucose continuously. Change sensor Q 14 days. DX E11.65 2 each 11   diltiazem (CARDIZEM CD) 240 MG 24 hr capsule Take 1 capsule (240 mg total) by mouth daily. 90 capsule 1   doxazosin (CARDURA) 2 MG tablet TAKE 1 TABLET BY MOUTH EVERY DAY 90 tablet 1   empagliflozin (JARDIANCE) 25 MG TABS tablet Take 1 tablet (25 mg total) by mouth daily before breakfast. 90 tablet 1   furosemide (LASIX) 20 MG tablet Take 1 tablet (20 mg total) by mouth daily. 30 tablet 3   glucose blood test strip 1 each by Other route as needed for other. Use as instructed 100 each 11   hydrALAZINE (APRESOLINE) 50 MG tablet Take 1 tablet (50 mg total) by mouth 3 (three) times daily. 90 tablet 0   losartan (COZAAR) 100 MG tablet Take 1 tablet (100 mg total) by mouth daily. 90 tablet 1   pioglitazone (ACTOS) 30 MG tablet Take 1 tablet (30 mg total) by mouth daily. 90 tablet 0   ferrous sulfate 325 (65 FE) MG EC tablet Take 325 mg by mouth 3 (three) times daily with meals. (Patient not taking: Reported on 11/16/2022)     No current facility-administered medications for this encounter.    REVIEW OF SYSTEMS:  Notable for that above.   PHYSICAL EXAM:  height is 5\' 9"  (1.753 m) and weight is 162 lb 12.8 oz (73.8 kg). His temperature is 97.3 F (36.3 C) (abnormal). His blood pressure is 159/96 (abnormal) and his pulse is 109 (abnormal). His respiration is 20 and oxygen saturation is 100%.   General: Alert and oriented, in no acute distress  - breathing RA HEENT: Head is normocephalic. Extraocular movements are intact.  Musculoskeletal:  difficulty raising Left arm Neurologic:  difficulty raising Left arm.  Ambulatory. Fluent speech. A+Ox3 Psychiatric: Judgment and insight are intact. Affect is appropriate.  ECOG = 1  0 - Asymptomatic (Fully active, able to carry on all predisease activities without restriction)  1 - Symptomatic but completely ambulatory (Restricted in physically strenuous activity but ambulatory and able to carry out work of a light or sedentary nature. For example, light housework, office work)  2 - Symptomatic, <50% in bed during the day (Ambulatory and capable of all self care but unable to carry out any work activities. Up and about more than 50% of waking hours)  3 - Symptomatic, >50% in bed, but not bedbound (  Capable of only limited self-care, confined to bed or chair 50% or more of waking hours)  4 - Bedbound (Completely disabled. Cannot carry on any self-care. Totally confined to bed or chair)  5 - Death   Santiago Glad MM, Creech RH, Tormey DC, et al. [phone]). "Toxicity and response criteria of the [facility]". Am. Evlyn Clines. Oncol. 5 (6): 649-55   LABORATORY DATA:  Lab Results  Component Value Date   WBC 5.4 10/16/2022   HGB 10.6 (L) 10/16/2022   HCT 32.4 (L) 10/16/2022   MCV 89.8 10/16/2022   PLT 218 10/16/2022   CMP     Component Value Date/Time   NA 139 11/29/2022 1423   NA 139 09/01/2021 1014   K 3.6 11/29/2022 1423   CL 105 11/29/2022 1423   CO2 24 11/29/2022 1423   GLUCOSE 168 (H) 11/29/2022 1423   BUN 19 11/29/2022 1423   BUN 17 09/01/2021 1014   CREATININE 1.70 (H) 11/29/2022 1423   CALCIUM 8.4 (L) 11/29/2022 1423   PROT 6.7 10/16/2022 1145   ALBUMIN 3.3 (L) 11/03/2021 0821   AST 16 10/16/2022 1145   AST 14 (L) 03/20/2021 0909   ALT 11 10/16/2022 1145   ALT 13 03/20/2021 0909   ALKPHOS 65 11/03/2021 0821   BILITOT 0.6 10/16/2022 1145   BILITOT 0.3 03/20/2021 0909   GFR 57.49 (L) 02/28/2010 1227   EGFR 31 (L) 11/06/2022 1436   EGFR 42 (L) 09/01/2021  1014   GFRNONAA 39 (L) 11/09/2021 0726   GFRNONAA 34 (L) 03/20/2021 0909   GFRNONAA 61 11/30/2019 0915         RADIOGRAPHY: CT CHEST ABDOMEN PELVIS W CONTRAST  Result Date: 12/13/2022 CLINICAL DATA:  Restaging non-small cell lung cancer. * Tracking Code: BO * EXAM: CT CHEST, ABDOMEN, AND PELVIS WITH CONTRAST TECHNIQUE: Multidetector CT imaging of the chest, abdomen and pelvis was performed following the standard protocol during bolus administration of intravenous contrast. RADIATION DOSE REDUCTION: This exam was performed according to the departmental dose-optimization program which includes automated exposure control, adjustment of the mA and/or kV according to patient size and/or use of iterative reconstruction technique. CONTRAST:  75mL OMNIPAQUE IOHEXOL 300 MG/ML  SOLN COMPARISON:  PET-CT 09/10/2022 and chest CT 07/25/2022 FINDINGS: CT CHEST FINDINGS Cardiovascular: The heart is upper limits of in size. Small pericardial effusion persists. Stable age advanced atherosclerotic calcification involving the aorta and iliac arteries. Mediastinum/Nodes: Progressive mediastinal and hilar lymphadenopathy. Cluster of high right paratracheal nodes demonstrating significant enlargement since the prior PET-CT. The largest node measures 12 mm on image 23/2 and previously measured 5 mm. Precarinal lymph node on image 28/2 measures 25 mm and previously measured 18 mm. AP window adenopathy with largest node measuring 26 mm and previously measuring 24 mm. The more anterior node measures 20 mm and previously measured 13 mm. It appears necrotic. Difficult to measure the left infrahilar mass or adenopathy blending in with complete left lower lobe atelectasis/collapse due to left lower lobe bronchus obstruction. I would estimate the maximum width is 27 mm and was previously 24 mm. No supraclavicular or axillary adenopathy. Lungs/Pleura: Persistent obstruction of the left lower lobe bronchus with complete left lower lobe  atelectasis and surrounding pleural effusion. Two small right middle lobe pulmonary nodules are stable. Stable small right lower lobe subpleural calcified granuloma. No new pulmonary lesions new pulmonary nodules. Musculoskeletal: No significant bony findings. No lytic or sclerotic bone lesions. Remote healed sternal fracture. CT ABDOMEN PELVIS FINDINGS Hepatobiliary: No hepatic  lesions to suggest metastatic disease. The gallbladder is unremarkable. No common bile duct dilatation. Pancreas: Unremarkable. No pancreatic ductal dilatation or surrounding inflammatory changes. Spleen: Normal size.  No focal lesions. Adrenals/Urinary Tract: No adrenal gland lesions to suggest metastatic disease. No renal lesions bladder lesions. Stomach/Bowel: The stomach, duodenum, small bowel and colon are grossly normal without oral contrast. No inflammatory changes, mass lesions or obstructive findings. Vascular/Lymphatic: Stable advanced vascular disease but no aneurysm dissection. No mesenteric or retroperitoneal or pelvic adenopathy. Reproductive: The prostate gland and seminal vesicles are unremarkable. Other: Stable small periumbilical abdominal wall hernia containing fat. Musculoskeletal: No significant bony findings. Stable degenerative changes involving the lumbar spine. IMPRESSION: 1. Progressive mediastinal and hilar lymphadenopathy. 2. Persistent obstruction of the left lower lobe bronchus with complete left lower lobe atelectasis/collapse and surrounding pleural effusion. 3. Stable small right middle lobe pulmonary nodules. 4. No findings for abdominal/pelvic metastatic disease. Aortic Atherosclerosis (ICD10-I70.0). Electronically Signed   By: Rudie Meyer M.D.   On: 12/13/2022 09:47      IMPRESSION/PLAN:  We reviewed his biopsy results which show likely metastatic BOT cancer to the thorax.  I recommend 2 weeks of palliative RT to the chest (enlarged nodes). He is enthusiastic to proceed.  Today, I talked to the  patient about the findings and work-up thus far.  We discussed the patient's diagnosis  and general treatment for this, highlighting the role of radiotherapy in the management.  We discussed the available radiation techniques, and focused on the details of logistics and delivery.     We discussed the risks, benefits, and side effects of radiotherapy. Side effects may include but not necessarily be limited to: esophagitis, pneumonitis, skin irritation, fatigue No guarantees of treatment were given. A consent form was signed and placed in the patient's medical record. The patient was encouraged to ask questions that I answered to the best of my ability.    Proceed w/ simulation today.  Referral to SW for transportation consultation  On date of service, in total, I spent 30 minutes on this encounter. Patient was seen in person. Note signed after encounter date; minutes pertain to date of service, only.    __________________________________________   Lonie Peak, MD  This document serves as a record of services personally performed by Lonie Peak, MD. It was created on her behalf by Neena Rhymes, a trained medical scribe. The creation of this record is based on the scribe's personal observations and the provider's statements to them. This document has been checked and approved by the attending provider.

## 2022-12-14 ENCOUNTER — Ambulatory Visit
Admission: RE | Admit: 2022-12-14 | Discharge: 2022-12-14 | Disposition: A | Discharge status: Home / Self Care | Payer: Medicare HMO | Source: Ambulatory Visit | Attending: Radiation Oncology | Admitting: Radiation Oncology

## 2022-12-14 ENCOUNTER — Encounter: Payer: Self-pay | Admitting: Radiation Oncology

## 2022-12-14 ENCOUNTER — Inpatient Hospital Stay: Payer: Medicare HMO

## 2022-12-14 VITALS — BP 159/96 | HR 109 | Temp 97.3°F | Resp 20 | Ht 69.0 in | Wt 162.8 lb

## 2022-12-14 DIAGNOSIS — I429 Cardiomyopathy, unspecified: Secondary | ICD-10-CM | POA: Insufficient documentation

## 2022-12-14 DIAGNOSIS — E785 Hyperlipidemia, unspecified: Secondary | ICD-10-CM | POA: Insufficient documentation

## 2022-12-14 DIAGNOSIS — C771 Secondary and unspecified malignant neoplasm of intrathoracic lymph nodes: Secondary | ICD-10-CM | POA: Diagnosis not present

## 2022-12-14 DIAGNOSIS — C801 Malignant (primary) neoplasm, unspecified: Secondary | ICD-10-CM | POA: Insufficient documentation

## 2022-12-14 DIAGNOSIS — J90 Pleural effusion, not elsewhere classified: Secondary | ICD-10-CM | POA: Diagnosis not present

## 2022-12-14 DIAGNOSIS — I3139 Other pericardial effusion (noninflammatory): Secondary | ICD-10-CM | POA: Diagnosis not present

## 2022-12-14 DIAGNOSIS — Z803 Family history of malignant neoplasm of breast: Secondary | ICD-10-CM | POA: Insufficient documentation

## 2022-12-14 DIAGNOSIS — Z9221 Personal history of antineoplastic chemotherapy: Secondary | ICD-10-CM | POA: Diagnosis not present

## 2022-12-14 DIAGNOSIS — I10 Essential (primary) hypertension: Secondary | ICD-10-CM | POA: Diagnosis not present

## 2022-12-14 DIAGNOSIS — Z7901 Long term (current) use of anticoagulants: Secondary | ICD-10-CM | POA: Insufficient documentation

## 2022-12-14 DIAGNOSIS — Z800 Family history of malignant neoplasm of digestive organs: Secondary | ICD-10-CM | POA: Insufficient documentation

## 2022-12-14 DIAGNOSIS — Z8673 Personal history of transient ischemic attack (TIA), and cerebral infarction without residual deficits: Secondary | ICD-10-CM | POA: Diagnosis not present

## 2022-12-14 DIAGNOSIS — C3432 Malignant neoplasm of lower lobe, left bronchus or lung: Secondary | ICD-10-CM

## 2022-12-14 DIAGNOSIS — Z923 Personal history of irradiation: Secondary | ICD-10-CM | POA: Diagnosis not present

## 2022-12-14 DIAGNOSIS — Z79899 Other long term (current) drug therapy: Secondary | ICD-10-CM | POA: Insufficient documentation

## 2022-12-14 DIAGNOSIS — I700 Atherosclerosis of aorta: Secondary | ICD-10-CM | POA: Diagnosis not present

## 2022-12-14 DIAGNOSIS — Z7984 Long term (current) use of oral hypoglycemic drugs: Secondary | ICD-10-CM | POA: Insufficient documentation

## 2022-12-14 DIAGNOSIS — Z510 Encounter for antineoplastic radiation therapy: Secondary | ICD-10-CM | POA: Insufficient documentation

## 2022-12-14 DIAGNOSIS — C01 Malignant neoplasm of base of tongue: Secondary | ICD-10-CM | POA: Diagnosis not present

## 2022-12-14 DIAGNOSIS — C4492 Squamous cell carcinoma of skin, unspecified: Secondary | ICD-10-CM

## 2022-12-14 DIAGNOSIS — I4891 Unspecified atrial fibrillation: Secondary | ICD-10-CM | POA: Diagnosis not present

## 2022-12-14 NOTE — Progress Notes (Signed)
[facility] Clinical Social Work  Clinical Social Work was referred by medical provider for assessment of psychosocial needs.  Clinical Social Worker met with patient to offer support and assess for needs.  CSW completed brief assessment on patient. Primary stressors are return of cancer, care taking for his mother, and transportation. Patient expects his car to be fixed in a couple of days. CSW scheduled to follow up on Monday and will refer to C. Vilsaint.  Marguerita Merles, LCSWA Clinical Social Worker [facility]

## 2022-12-17 ENCOUNTER — Telehealth: Payer: Self-pay

## 2022-12-17 DIAGNOSIS — I1A0 Resistant hypertension: Secondary | ICD-10-CM | POA: Diagnosis not present

## 2022-12-17 DIAGNOSIS — C3490 Malignant neoplasm of unspecified part of unspecified bronchus or lung: Secondary | ICD-10-CM | POA: Diagnosis not present

## 2022-12-17 DIAGNOSIS — N1832 Chronic kidney disease, stage 3b: Secondary | ICD-10-CM | POA: Diagnosis not present

## 2022-12-17 DIAGNOSIS — E1122 Type 2 diabetes mellitus with diabetic chronic kidney disease: Secondary | ICD-10-CM | POA: Diagnosis not present

## 2022-12-17 DIAGNOSIS — N189 Chronic kidney disease, unspecified: Secondary | ICD-10-CM | POA: Diagnosis not present

## 2022-12-17 DIAGNOSIS — N2581 Secondary hyperparathyroidism of renal origin: Secondary | ICD-10-CM | POA: Diagnosis not present

## 2022-12-17 DIAGNOSIS — D631 Anemia in chronic kidney disease: Secondary | ICD-10-CM | POA: Diagnosis not present

## 2022-12-17 NOTE — Telephone Encounter (Signed)
[facility] CSW Progress Note  Clinical Child psychotherapist contacted patient by phone to discuss transportation to appointments, as planned. Patient reported that his car has not yet been fixed. CSW placed referral for transportation coordinator C.Vilsaint to see if patient is eligible for assistance. CSW informed patient that transportation assistance may not be set for his Wednesday appointment, however, Ephriam Knuckles will discuss this further.   Marguerita Merles, LCSWA Clinical Social Worker [facility]

## 2022-12-18 ENCOUNTER — Telehealth: Payer: Self-pay

## 2022-12-18 DIAGNOSIS — C771 Secondary and unspecified malignant neoplasm of intrathoracic lymph nodes: Secondary | ICD-10-CM | POA: Diagnosis not present

## 2022-12-18 DIAGNOSIS — C3432 Malignant neoplasm of lower lobe, left bronchus or lung: Secondary | ICD-10-CM | POA: Diagnosis not present

## 2022-12-18 DIAGNOSIS — C801 Malignant (primary) neoplasm, unspecified: Secondary | ICD-10-CM | POA: Diagnosis not present

## 2022-12-18 DIAGNOSIS — Z510 Encounter for antineoplastic radiation therapy: Secondary | ICD-10-CM | POA: Diagnosis not present

## 2022-12-18 NOTE — Telephone Encounter (Signed)
RN called and left message for pt son after he had contacted radiation team about concern for rides for treatment. Rn will attempt to reach out to pt son tomorrow to follow up on this concern. RN did leave message with call back details.

## 2022-12-19 ENCOUNTER — Inpatient Hospital Stay: Payer: Medicare HMO

## 2022-12-19 ENCOUNTER — Ambulatory Visit
Admission: RE | Admit: 2022-12-19 | Discharge: 2022-12-19 | Disposition: A | Discharge status: Home / Self Care | Payer: Medicare HMO | Source: Ambulatory Visit | Attending: Radiation Oncology | Admitting: Radiation Oncology

## 2022-12-19 ENCOUNTER — Other Ambulatory Visit: Payer: Self-pay

## 2022-12-19 ENCOUNTER — Telehealth: Payer: Self-pay

## 2022-12-19 DIAGNOSIS — C801 Malignant (primary) neoplasm, unspecified: Secondary | ICD-10-CM | POA: Diagnosis not present

## 2022-12-19 DIAGNOSIS — C771 Secondary and unspecified malignant neoplasm of intrathoracic lymph nodes: Secondary | ICD-10-CM | POA: Diagnosis not present

## 2022-12-19 DIAGNOSIS — Z510 Encounter for antineoplastic radiation therapy: Secondary | ICD-10-CM | POA: Diagnosis not present

## 2022-12-19 DIAGNOSIS — C3432 Malignant neoplasm of lower lobe, left bronchus or lung: Secondary | ICD-10-CM | POA: Diagnosis not present

## 2022-12-19 LAB — RAD ONC ARIA SESSION SUMMARY
Course Elapsed Days: 0
Plan Fractions Treated to Date: 1
Plan Prescribed Dose Per Fraction: 3 Gy
Plan Total Fractions Prescribed: 10
Plan Total Prescribed Dose: 30 Gy
Reference Point Dosage Given to Date: 3 Gy
Reference Point Session Dosage Given: 3 Gy
Session Number: 1

## 2022-12-19 NOTE — Telephone Encounter (Signed)
RN attempted to call pt son to update him on the ride arrangements for his dad for treatment. Rn left message with call back details.

## 2022-12-20 ENCOUNTER — Ambulatory Visit
Admission: RE | Admit: 2022-12-20 | Discharge: 2022-12-20 | Disposition: A | Discharge status: Home / Self Care | Payer: Medicare HMO | Source: Ambulatory Visit | Attending: Radiation Oncology | Admitting: Radiation Oncology

## 2022-12-20 ENCOUNTER — Other Ambulatory Visit: Payer: Self-pay

## 2022-12-20 DIAGNOSIS — Z510 Encounter for antineoplastic radiation therapy: Secondary | ICD-10-CM | POA: Diagnosis not present

## 2022-12-20 DIAGNOSIS — C3432 Malignant neoplasm of lower lobe, left bronchus or lung: Secondary | ICD-10-CM | POA: Diagnosis not present

## 2022-12-20 DIAGNOSIS — C771 Secondary and unspecified malignant neoplasm of intrathoracic lymph nodes: Secondary | ICD-10-CM | POA: Diagnosis not present

## 2022-12-20 DIAGNOSIS — C801 Malignant (primary) neoplasm, unspecified: Secondary | ICD-10-CM | POA: Diagnosis not present

## 2022-12-20 LAB — RAD ONC ARIA SESSION SUMMARY
Course Elapsed Days: 1
Plan Fractions Treated to Date: 2
Plan Prescribed Dose Per Fraction: 3 Gy
Plan Total Fractions Prescribed: 10
Plan Total Prescribed Dose: 30 Gy
Reference Point Dosage Given to Date: 6 Gy
Reference Point Session Dosage Given: 3 Gy
Session Number: 2

## 2022-12-21 ENCOUNTER — Ambulatory Visit
Admission: RE | Admit: 2022-12-21 | Discharge: 2022-12-21 | Disposition: A | Discharge status: Home / Self Care | Payer: Medicare HMO | Source: Ambulatory Visit | Attending: Radiation Oncology | Admitting: Radiation Oncology

## 2022-12-21 ENCOUNTER — Other Ambulatory Visit: Payer: Self-pay

## 2022-12-21 DIAGNOSIS — C771 Secondary and unspecified malignant neoplasm of intrathoracic lymph nodes: Secondary | ICD-10-CM | POA: Diagnosis not present

## 2022-12-21 DIAGNOSIS — C3432 Malignant neoplasm of lower lobe, left bronchus or lung: Secondary | ICD-10-CM | POA: Insufficient documentation

## 2022-12-21 DIAGNOSIS — Z510 Encounter for antineoplastic radiation therapy: Secondary | ICD-10-CM | POA: Diagnosis not present

## 2022-12-21 LAB — RAD ONC ARIA SESSION SUMMARY
Course Elapsed Days: 2
Plan Fractions Treated to Date: 3
Plan Prescribed Dose Per Fraction: 3 Gy
Plan Total Fractions Prescribed: 10
Plan Total Prescribed Dose: 30 Gy
Reference Point Dosage Given to Date: 9 Gy
Reference Point Session Dosage Given: 3 Gy
Session Number: 3

## 2022-12-24 ENCOUNTER — Ambulatory Visit
Admission: RE | Admit: 2022-12-24 | Discharge: 2022-12-24 | Disposition: A | Discharge status: Home / Self Care | Payer: Medicare HMO | Source: Ambulatory Visit | Attending: Radiation Oncology

## 2022-12-24 ENCOUNTER — Other Ambulatory Visit: Payer: Self-pay

## 2022-12-24 ENCOUNTER — Inpatient Hospital Stay: Payer: Medicare HMO | Attending: Hematology and Oncology

## 2022-12-24 ENCOUNTER — Other Ambulatory Visit (HOSPITAL_COMMUNITY): Payer: Self-pay | Admitting: *Deleted

## 2022-12-24 ENCOUNTER — Ambulatory Visit: Payer: Medicare HMO

## 2022-12-24 DIAGNOSIS — C3432 Malignant neoplasm of lower lobe, left bronchus or lung: Secondary | ICD-10-CM | POA: Diagnosis not present

## 2022-12-24 DIAGNOSIS — C771 Secondary and unspecified malignant neoplasm of intrathoracic lymph nodes: Secondary | ICD-10-CM | POA: Diagnosis not present

## 2022-12-24 DIAGNOSIS — Z510 Encounter for antineoplastic radiation therapy: Secondary | ICD-10-CM | POA: Diagnosis not present

## 2022-12-24 LAB — RAD ONC ARIA SESSION SUMMARY
Course Elapsed Days: 5
Plan Fractions Treated to Date: 4
Plan Prescribed Dose Per Fraction: 3 Gy
Plan Total Fractions Prescribed: 10
Plan Total Prescribed Dose: 30 Gy
Reference Point Dosage Given to Date: 12 Gy
Reference Point Session Dosage Given: 3 Gy
Session Number: 4

## 2022-12-25 ENCOUNTER — Ambulatory Visit
Admission: RE | Admit: 2022-12-25 | Discharge: 2022-12-25 | Disposition: A | Discharge status: Home / Self Care | Payer: Medicare HMO | Source: Ambulatory Visit | Attending: Radiation Oncology

## 2022-12-25 ENCOUNTER — Other Ambulatory Visit: Payer: Self-pay

## 2022-12-25 ENCOUNTER — Encounter (HOSPITAL_COMMUNITY): Payer: Medicare HMO

## 2022-12-25 DIAGNOSIS — Z510 Encounter for antineoplastic radiation therapy: Secondary | ICD-10-CM | POA: Diagnosis not present

## 2022-12-25 DIAGNOSIS — C3432 Malignant neoplasm of lower lobe, left bronchus or lung: Secondary | ICD-10-CM | POA: Diagnosis not present

## 2022-12-25 DIAGNOSIS — C771 Secondary and unspecified malignant neoplasm of intrathoracic lymph nodes: Secondary | ICD-10-CM | POA: Diagnosis not present

## 2022-12-25 LAB — RAD ONC ARIA SESSION SUMMARY
Course Elapsed Days: 6
Plan Fractions Treated to Date: 5
Plan Prescribed Dose Per Fraction: 3 Gy
Plan Total Fractions Prescribed: 10
Plan Total Prescribed Dose: 30 Gy
Reference Point Dosage Given to Date: 15 Gy
Reference Point Session Dosage Given: 3 Gy
Session Number: 5

## 2022-12-26 ENCOUNTER — Other Ambulatory Visit: Payer: Self-pay

## 2022-12-26 ENCOUNTER — Ambulatory Visit
Admission: RE | Admit: 2022-12-26 | Discharge: 2022-12-26 | Disposition: A | Discharge status: Home / Self Care | Payer: Medicare HMO | Source: Ambulatory Visit | Attending: Radiation Oncology

## 2022-12-26 DIAGNOSIS — C771 Secondary and unspecified malignant neoplasm of intrathoracic lymph nodes: Secondary | ICD-10-CM | POA: Diagnosis not present

## 2022-12-26 DIAGNOSIS — Z510 Encounter for antineoplastic radiation therapy: Secondary | ICD-10-CM | POA: Diagnosis not present

## 2022-12-26 DIAGNOSIS — C3432 Malignant neoplasm of lower lobe, left bronchus or lung: Secondary | ICD-10-CM | POA: Diagnosis not present

## 2022-12-26 LAB — RAD ONC ARIA SESSION SUMMARY
Course Elapsed Days: 7
Plan Fractions Treated to Date: 6
Plan Prescribed Dose Per Fraction: 3 Gy
Plan Total Fractions Prescribed: 10
Plan Total Prescribed Dose: 30 Gy
Reference Point Dosage Given to Date: 18 Gy
Reference Point Session Dosage Given: 3 Gy
Session Number: 6

## 2022-12-27 ENCOUNTER — Ambulatory Visit: Payer: Medicare HMO

## 2022-12-28 ENCOUNTER — Inpatient Hospital Stay: Payer: Medicare HMO

## 2022-12-28 ENCOUNTER — Ambulatory Visit
Admission: RE | Admit: 2022-12-28 | Discharge: 2022-12-28 | Disposition: A | Discharge status: Home / Self Care | Payer: Medicare HMO | Source: Ambulatory Visit | Attending: Radiation Oncology | Admitting: Radiation Oncology

## 2022-12-28 ENCOUNTER — Other Ambulatory Visit: Payer: Self-pay

## 2022-12-28 DIAGNOSIS — Z510 Encounter for antineoplastic radiation therapy: Secondary | ICD-10-CM | POA: Diagnosis not present

## 2022-12-28 DIAGNOSIS — C771 Secondary and unspecified malignant neoplasm of intrathoracic lymph nodes: Secondary | ICD-10-CM | POA: Diagnosis not present

## 2022-12-28 DIAGNOSIS — C3432 Malignant neoplasm of lower lobe, left bronchus or lung: Secondary | ICD-10-CM | POA: Diagnosis not present

## 2022-12-28 LAB — RAD ONC ARIA SESSION SUMMARY
Course Elapsed Days: 9
Plan Fractions Treated to Date: 7
Plan Prescribed Dose Per Fraction: 3 Gy
Plan Total Fractions Prescribed: 10
Plan Total Prescribed Dose: 30 Gy
Reference Point Dosage Given to Date: 21 Gy
Reference Point Session Dosage Given: 3 Gy
Session Number: 7

## 2022-12-31 ENCOUNTER — Ambulatory Visit
Admission: RE | Admit: 2022-12-31 | Discharge: 2022-12-31 | Disposition: A | Discharge status: Home / Self Care | Payer: Medicare HMO | Source: Ambulatory Visit | Attending: Radiation Oncology | Admitting: Radiation Oncology

## 2022-12-31 ENCOUNTER — Other Ambulatory Visit: Payer: Self-pay | Admitting: *Deleted

## 2022-12-31 ENCOUNTER — Ambulatory Visit
Admission: RE | Admit: 2022-12-31 | Discharge: 2022-12-31 | Disposition: A | Discharge status: Home / Self Care | Payer: Medicare HMO | Source: Ambulatory Visit | Attending: Radiation Oncology

## 2022-12-31 ENCOUNTER — Inpatient Hospital Stay: Payer: Medicare HMO | Admitting: Nurse Practitioner

## 2022-12-31 ENCOUNTER — Other Ambulatory Visit: Payer: Self-pay

## 2022-12-31 DIAGNOSIS — Z510 Encounter for antineoplastic radiation therapy: Secondary | ICD-10-CM | POA: Diagnosis not present

## 2022-12-31 DIAGNOSIS — C3432 Malignant neoplasm of lower lobe, left bronchus or lung: Secondary | ICD-10-CM | POA: Diagnosis not present

## 2022-12-31 DIAGNOSIS — C771 Secondary and unspecified malignant neoplasm of intrathoracic lymph nodes: Secondary | ICD-10-CM | POA: Diagnosis not present

## 2022-12-31 LAB — RAD ONC ARIA SESSION SUMMARY
Course Elapsed Days: 12
Plan Fractions Treated to Date: 8
Plan Prescribed Dose Per Fraction: 3 Gy
Plan Total Fractions Prescribed: 10
Plan Total Prescribed Dose: 30 Gy
Reference Point Dosage Given to Date: 24 Gy
Reference Point Session Dosage Given: 3 Gy
Session Number: 8

## 2022-12-31 NOTE — Progress Notes (Deleted)
Palliative Medicine [facility]  Telephone:([phone]) [phone] Fax:(336) [phone]   Name: Brian [name] Date: 12/31/2022 MRN: [id number]  DOB: 23-Nov-1955  Patient Care Team: [name], MD as PCP - General (Family Medicine) Rollene Rotunda, MD as PCP - Cardiology (Cardiology) [name], Lise Auer, RN as Oncology Nurse Navigator Lonie Peak, MD as Consulting Physician (Radiation Oncology) Rachel Moulds, MD as Consulting Physician (Hematology and Oncology) Anabel Bene, RD as Dietitian (Nutrition) Sundra Aland Karie Georges, [name] as Speech Language Pathologist (Speech Pathology) Jeanella Craze, Remi Deter, PT as Physical Therapist (Physical Therapy) Sallee Lange, [name] (Inactive) as Social Worker (General Practice) Newman Pies, MD as Consulting Physician (Otolaryngology) Erroll Luna, [facility] (Inactive) as Pharmacist (Pharmacist) Rollene Rotunda, MD as Consulting Physician (Cardiology)    INTERVAL HISTORY: Brian [name] is a 67 y.o. 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 Previous history also includes CKD, a-fib, type 2 diabetes, HLD, heart failure, and a history of seizures. Palliative ask to see for symptom management and goals of care.   SOCIAL HISTORY:     reports that he has never smoked. He has never been exposed to tobacco smoke. He has never used smokeless tobacco. He reports current alcohol use. He reports that he does not currently use drugs after having used the following drugs: Marijuana. Frequency: 7.00 times per week.  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 [name], laser therapy OD   Seizures (HCC)    pt reports this is from low blood sugar    ALLERGIES:  is allergic to codeine and metformin and related.  MEDICATIONS:  Current Outpatient Medications  Medication Sig Dispense Refill   apixaban (ELIQUIS) 5 MG TABS tablet Take 1 tablet (5 mg total) by mouth 2 (two) times daily. 60 tablet 5   atorvastatin (LIPITOR) 20 MG tablet Take 1 tablet (20 mg total) by mouth daily. 90 tablet 3   Blood Glucose Monitoring Suppl (ONETOUCH VERIO IQ SYSTEM) w/Device KIT 1 each by Does not apply route 2 (two) times daily. 1 kit 0   carvedilol (COREG) 25 MG tablet Take 1 tablet (25 mg total) by mouth 2 (two) times daily. 180 tablet 3   Continuous Blood Gluc Sensor (FREESTYLE LIBRE 2 SENSOR) MISC Use as directed to monitor blood glucose continuously. Change sensor Q 14 days. DX E11.65 2 each 11   diltiazem (CARDIZEM CD) 240 MG 24 hr capsule Take 1 capsule (240 mg total) by mouth daily. 90 capsule 1   doxazosin (CARDURA) 2 MG tablet TAKE 1 TABLET BY MOUTH EVERY DAY 90 tablet 1   empagliflozin (JARDIANCE) 25 MG TABS tablet Take 1 tablet (25 mg total) by mouth daily before breakfast. 90 tablet 1   ferrous sulfate 325 (65 FE) MG EC tablet Take 325 mg by mouth 3 (three) times daily with meals. (Patient not taking: Reported on 11/16/2022)     furosemide (LASIX) 20 MG  tablet Take 1 tablet (20 mg total) by mouth daily. 30 tablet 3   glucose blood test strip 1 each by Other route as needed for other. Use as instructed 100 each 11   hydrALAZINE (APRESOLINE) 50 MG tablet Take 1 tablet (50 mg total) by mouth 3 (three) times daily. 90 tablet 0   losartan (COZAAR) 100 MG tablet Take 1 tablet (100 mg total) by mouth daily. 90 tablet 1   pioglitazone (ACTOS) 30 MG tablet Take 1 tablet (30 mg total) by mouth daily. 90 tablet 0   No current facility-administered  medications for this visit.    VITAL SIGNS: There were no vitals taken for this visit. There were no vitals filed for this visit.  Estimated body mass index is 24.04 kg/m as calculated from the following:   Height as of 12/14/22: 5\' 9"  (1.753 m).   Weight as of 12/14/22: 162 lb 12.8 oz (73.8 kg).   PERFORMANCE STATUS (ECOG) : 1 - Symptomatic but completely ambulatory   Physical Exam General: NAD Cardiovascular: regular rate and rhythm Pulmonary: clear ant fields Abdomen: soft, nontender, + bowel sounds Extremities: no edema, no joint deformities Skin: no rashes Neurological:   IMPRESSION: ***  We discussed Her current illness and what it means in the larger context of Her on-going co-morbidities. Natural disease trajectory and expectations were discussed.  I discussed the importance of continued conversation with family and their medical providers regarding overall plan of care and treatment options, ensuring decisions are within the context of the patients values and GOCs.  PLAN: Established therapeutic relationship. Education provided on palliative's role in collaboration with their Oncology/Radiation team. No symptom management needs at this time.  Ongoing goals of care discussions as needed. I will plan to see patient back in 4-6 weeks in collaboration to other oncology appointments. Patient knows to contact office as needed.    Patient expressed understanding and was in agreement with this plan. He also understands that He can call the clinic at any time with any questions, concerns, or complaints.   Any controlled substances utilized were prescribed in the context of palliative care. PDMP has been reviewed.    Visit consisted of counseling and education dealing with the complex and emotionally intense issues of symptom management and palliative care in the setting of serious and potentially life-threatening illness.  Willette Alma, AGPCNP-BC  Palliative  Medicine Team/[facility]  *Please note that this is a verbal dictation therefore any spelling or grammatical errors are due to the "Dragon Medical One" system interpretation.

## 2022-12-31 NOTE — Telephone Encounter (Signed)
Prescription Request  12/31/2022  LOV: 11/06/22  What is the name of the medication or equipment? hydrALAZINE (APRESOLINE) 50 MG tablet [[id number]]  Have you contacted your pharmacy to request a refill? Yes   Which pharmacy would you like this sent to?  [id number] [place], [place] - [address] [place] [address] Phone: [phone] Fax: [phone]    Patient notified that their request is being sent to the clinical staff for review and that they should receive a response within 2 business days.   Please advise at [facility] [phone]

## 2023-01-01 ENCOUNTER — Ambulatory Visit
Admission: RE | Admit: 2023-01-01 | Discharge: 2023-01-01 | Discharge status: Home / Self Care | Payer: Medicare HMO | Source: Ambulatory Visit | Attending: Radiation Oncology

## 2023-01-01 ENCOUNTER — Other Ambulatory Visit: Payer: Self-pay

## 2023-01-01 ENCOUNTER — Ambulatory Visit
Admission: RE | Admit: 2023-01-01 | Discharge: 2023-01-01 | Disposition: A | Discharge status: Home / Self Care | Payer: Medicare HMO | Source: Ambulatory Visit | Attending: Radiation Oncology | Admitting: Radiation Oncology

## 2023-01-01 ENCOUNTER — Telehealth: Payer: Self-pay

## 2023-01-01 ENCOUNTER — Encounter (HOSPITAL_COMMUNITY): Payer: Medicare HMO

## 2023-01-01 DIAGNOSIS — I10 Essential (primary) hypertension: Secondary | ICD-10-CM

## 2023-01-01 DIAGNOSIS — C3432 Malignant neoplasm of lower lobe, left bronchus or lung: Secondary | ICD-10-CM

## 2023-01-01 DIAGNOSIS — C771 Secondary and unspecified malignant neoplasm of intrathoracic lymph nodes: Secondary | ICD-10-CM | POA: Diagnosis not present

## 2023-01-01 DIAGNOSIS — Z510 Encounter for antineoplastic radiation therapy: Secondary | ICD-10-CM | POA: Diagnosis not present

## 2023-01-01 LAB — BASIC METABOLIC PANEL - CANCER CENTER ONLY
Anion gap: 9 (ref 5–15)
BUN: 19 mg/dL (ref 8–23)
CO2: 24 mmol/L (ref 22–32)
Calcium: 9 mg/dL (ref 8.9–10.3)
Chloride: 101 mmol/L (ref 98–111)
Creatinine: 1.64 mg/dL — ABNORMAL HIGH (ref 0.61–1.24)
GFR, Estimated: 45 mL/min — ABNORMAL LOW (ref >60)
Glucose, Bld: 179 mg/dL — ABNORMAL HIGH (ref 70–99)
Potassium: 3.3 mmol/L — ABNORMAL LOW (ref 3.5–5.1)
Sodium: 134 mmol/L — ABNORMAL LOW (ref 135–145)

## 2023-01-01 LAB — RAD ONC ARIA SESSION SUMMARY
Course Elapsed Days: 13
Plan Fractions Treated to Date: 9
Plan Prescribed Dose Per Fraction: 3 Gy
Plan Total Fractions Prescribed: 10
Plan Total Prescribed Dose: 30 Gy
Reference Point Dosage Given to Date: 27 Gy
Reference Point Session Dosage Given: 3 Gy
Session Number: 9

## 2023-01-01 MED ORDER — HYDRALAZINE HCL 50 MG PO TABS: 50.0000 mg | ORAL_TABLET | Freq: Three times a day (TID) | ORAL | 0 refills | Status: DC

## 2023-01-01 NOTE — Telephone Encounter (Signed)
Requested Prescriptions  Refused Prescriptions Disp Refills   hydrALAZINE (APRESOLINE) 50 MG tablet 90 tablet 0    Sig: Take 1 tablet (50 mg total) by mouth 3 (three) times daily.     Cardiovascular:  Vasodilators Failed - 12/31/2022 11:21 AM      Failed - HCT in normal range and within 360 days    HCT  Date Value Ref Range Status  10/16/2022 32.4 (L) 38.5 - 50.0 % Final         Failed - HGB in normal range and within 360 days    Hemoglobin  Date Value Ref Range Status  10/16/2022 10.6 (L) 13.2 - 17.1 g/dL Final  29/52/8413 24.4 (L) 13.0 - 17.0 g/dL Final         Failed - RBC in normal range and within 360 days    RBC  Date Value Ref Range Status  10/16/2022 3.61 (L) 4.20 - 5.80 Million/uL Final         Failed - ANA Screen, Ifa, Serum in normal range and within 360 days    No results found for: "ANA", "ANATITER", "LABANTI"       Failed - Last BP in normal range    BP Readings from Last 1 Encounters:  12/14/22 (!) 159/96         Failed - Valid encounter within last 12 months    Recent Outpatient Visits           1 year ago Atrial fibrillation, unspecified type (HCC)   [facility] Pickard, Priscille Heidelberg, MD   1 year ago Atrial fibrillation, unspecified type [facility])   [facility] Pickard, Priscille Heidelberg, MD   1 year ago Atrial fibrillation, unspecified type [facility])   [facility] Pickard, Priscille Heidelberg, MD   1 year ago Atrial fibrillation, unspecified type [facility])   [facility] [name], MD   1 year ago Atrial fibrillation, unspecified type [facility])   [facility] Pickard, Priscille Heidelberg, MD              Passed - WBC in normal range and within 360 days    WBC  Date Value Ref Range Status  10/16/2022 5.4 3.8 - 10.8 Thousand/uL Final         Passed - PLT in normal range and within 360 days    Platelets  Date Value Ref Range Status  10/16/2022 218 140 - 400 Thousand/uL Final   Platelet Count  Date  Value Ref Range Status  09/16/2020 161 150 - 400 K/uL Final

## 2023-01-01 NOTE — Telephone Encounter (Signed)
Copied from CRM [id number]. Topic: Clinical - Medication Refill >> Jan 01, 2023  8:02 AM Almira Coaster wrote: Most Recent Primary Care Visit:  Provider: [facility]  Department: [facility]  Visit Type: LAB  Date: 11/29/2022  Medication: hydrALAZINE (APRESOLINE) 50 MG tablet  Has the patient contacted their pharmacy? Yes (Agent: If no, request that the patient contact the pharmacy for the refill. If patient does not wish to contact the pharmacy document the reason why and proceed with request.) (Agent: If yes, when and what did the pharmacy advise?)  Is this the correct pharmacy for this prescription? Yes If no, delete pharmacy and type the correct one.  This is the patient's preferred pharmacy:  [id number] [place], [place] - [address] [place] [address] Phone: [phone] Fax: [phone]   Has the prescription been filled recently? No  Is the patient out of the medication? Yes  Has the patient been seen for an appointment in the last year OR does the patient have an upcoming appointment? Yes  Can we respond through MyChart? No, patient prefers a call.  Agent: Please be advised that Rx refills may take up to 3 business days. We ask that you follow-up with your pharmacy.

## 2023-01-02 ENCOUNTER — Other Ambulatory Visit: Payer: Self-pay

## 2023-01-02 ENCOUNTER — Ambulatory Visit
Admission: RE | Admit: 2023-01-02 | Discharge: 2023-01-02 | Disposition: A | Discharge status: Home / Self Care | Payer: Medicare HMO | Source: Ambulatory Visit | Attending: Radiation Oncology | Admitting: Radiation Oncology

## 2023-01-02 DIAGNOSIS — C771 Secondary and unspecified malignant neoplasm of intrathoracic lymph nodes: Secondary | ICD-10-CM | POA: Diagnosis not present

## 2023-01-02 DIAGNOSIS — C3432 Malignant neoplasm of lower lobe, left bronchus or lung: Secondary | ICD-10-CM | POA: Diagnosis not present

## 2023-01-02 DIAGNOSIS — Z510 Encounter for antineoplastic radiation therapy: Secondary | ICD-10-CM | POA: Diagnosis not present

## 2023-01-02 LAB — RAD ONC ARIA SESSION SUMMARY
Course Elapsed Days: 14
Plan Fractions Treated to Date: 10
Plan Prescribed Dose Per Fraction: 3 Gy
Plan Total Fractions Prescribed: 10
Plan Total Prescribed Dose: 30 Gy
Reference Point Dosage Given to Date: 30 Gy
Reference Point Session Dosage Given: 3 Gy
Session Number: 10

## 2023-01-03 NOTE — Radiation Completion Notes (Signed)
Patient Name: Brian [name], Brian [name] MRN: [id number] Date of Birth: 1955-11-02 Referring Physician: Lynnea Ferrier, M.D. Date of Service: 2023-01-03 Radiation Oncologist: Lonie Peak, M.D. 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 Staging on 2020-03-01: Malignant neoplasm of base of tongue (HCC) T=cT2, N=cN3, M=cM0 Intent: Palliative     ==========DELIVERED PLANS==========  First Treatment Date: 2022-12-19 - Last Treatment Date: 2023-01-02   Plan Name: Chest Site: Thorax Technique: 3D Mode: Photon Dose Per Fraction: 3 Gy Prescribed Dose (Delivered / Prescribed): 30 Gy / 30 Gy Prescribed Fxs (Delivered / Prescribed): 10 / 10     ==========ON TREATMENT VISIT DATES========== 2022-12-24, 2022-12-31     ==========UPCOMING VISITS==========       ==========APPENDIX - ON TREATMENT VISIT NOTES==========   See weekly On Treatment Notes in Epic for details.

## 2023-01-04 ENCOUNTER — Emergency Department (HOSPITAL_COMMUNITY): Payer: Medicare HMO

## 2023-01-04 ENCOUNTER — Encounter (HOSPITAL_COMMUNITY): Payer: Self-pay | Admitting: *Deleted

## 2023-01-04 ENCOUNTER — Emergency Department (HOSPITAL_COMMUNITY)
Admission: EM | Admit: 2023-01-04 | Discharge: 2023-01-04 | Disposition: A | Discharge status: Home / Self Care | Payer: Medicare HMO | Attending: Emergency Medicine | Admitting: Emergency Medicine

## 2023-01-04 ENCOUNTER — Other Ambulatory Visit: Payer: Self-pay

## 2023-01-04 DIAGNOSIS — R590 Localized enlarged lymph nodes: Secondary | ICD-10-CM | POA: Diagnosis not present

## 2023-01-04 DIAGNOSIS — S0093XA Contusion of unspecified part of head, initial encounter: Secondary | ICD-10-CM | POA: Diagnosis not present

## 2023-01-04 DIAGNOSIS — W01198A Fall on same level from slipping, tripping and stumbling with subsequent striking against other object, initial encounter: Secondary | ICD-10-CM | POA: Insufficient documentation

## 2023-01-04 DIAGNOSIS — S199XXA Unspecified injury of neck, initial encounter: Secondary | ICD-10-CM | POA: Diagnosis not present

## 2023-01-04 DIAGNOSIS — R918 Other nonspecific abnormal finding of lung field: Secondary | ICD-10-CM | POA: Diagnosis not present

## 2023-01-04 DIAGNOSIS — M160 Bilateral primary osteoarthritis of hip: Secondary | ICD-10-CM | POA: Diagnosis not present

## 2023-01-04 DIAGNOSIS — T1490XA Injury, unspecified, initial encounter: Secondary | ICD-10-CM

## 2023-01-04 DIAGNOSIS — S0083XA Contusion of other part of head, initial encounter: Secondary | ICD-10-CM | POA: Diagnosis not present

## 2023-01-04 DIAGNOSIS — Z043 Encounter for examination and observation following other accident: Secondary | ICD-10-CM | POA: Diagnosis not present

## 2023-01-04 DIAGNOSIS — S0003XA Contusion of scalp, initial encounter: Secondary | ICD-10-CM | POA: Insufficient documentation

## 2023-01-04 DIAGNOSIS — W19XXXA Unspecified fall, initial encounter: Secondary | ICD-10-CM | POA: Diagnosis not present

## 2023-01-04 DIAGNOSIS — Z7901 Long term (current) use of anticoagulants: Secondary | ICD-10-CM | POA: Diagnosis not present

## 2023-01-04 DIAGNOSIS — J90 Pleural effusion, not elsewhere classified: Secondary | ICD-10-CM | POA: Diagnosis not present

## 2023-01-04 DIAGNOSIS — S0990XA Unspecified injury of head, initial encounter: Secondary | ICD-10-CM | POA: Diagnosis not present

## 2023-01-04 LAB — COMPREHENSIVE METABOLIC PANEL
ALT: 12 U/L (ref 0–44)
AST: 12 U/L — ABNORMAL LOW (ref 15–41)
Albumin: 2.9 g/dL — ABNORMAL LOW (ref 3.5–5.0)
Alkaline Phosphatase: 51 U/L (ref 38–126)
Anion gap: 12 (ref 5–15)
BUN: 20 mg/dL (ref 8–23)
CO2: 21 mmol/L — ABNORMAL LOW (ref 22–32)
Calcium: 8.6 mg/dL — ABNORMAL LOW (ref 8.9–10.3)
Chloride: 101 mmol/L (ref 98–111)
Creatinine, Ser: 1.62 mg/dL — ABNORMAL HIGH (ref 0.61–1.24)
GFR, Estimated: 46 mL/min — ABNORMAL LOW (ref >60)
Glucose, Bld: 209 mg/dL — ABNORMAL HIGH (ref 70–99)
Potassium: 3.3 mmol/L — ABNORMAL LOW (ref 3.5–5.1)
Sodium: 134 mmol/L — ABNORMAL LOW (ref 135–145)
Total Bilirubin: 0.7 mg/dL (ref <1.2)
Total Protein: 6.2 g/dL — ABNORMAL LOW (ref 6.5–8.1)

## 2023-01-04 LAB — CBC WITH DIFFERENTIAL/PLATELET
Abs Immature Granulocytes: 0.01 10*3/uL (ref 0.00–0.07)
Basophils Absolute: 0 10*3/uL (ref 0.0–0.1)
Basophils Relative: 0 %
Eosinophils Absolute: 0 10*3/uL (ref 0.0–0.5)
Eosinophils Relative: 1 %
HCT: 32.7 % — ABNORMAL LOW (ref 39.0–52.0)
Hemoglobin: 10.6 g/dL — ABNORMAL LOW (ref 13.0–17.0)
Immature Granulocytes: 0 %
Lymphocytes Relative: 5 %
Lymphs Abs: 0.2 10*3/uL — ABNORMAL LOW (ref 0.7–4.0)
MCH: 27.8 pg (ref 26.0–34.0)
MCHC: 32.4 g/dL (ref 30.0–36.0)
MCV: 85.8 fL (ref 80.0–100.0)
Monocytes Absolute: 0.3 10*3/uL (ref 0.1–1.0)
Monocytes Relative: 9 %
Neutro Abs: 2.6 10*3/uL (ref 1.7–7.7)
Neutrophils Relative %: 85 %
Platelets: 205 10*3/uL (ref 150–400)
RBC: 3.81 MIL/uL — ABNORMAL LOW (ref 4.22–5.81)
RDW: 13.5 % (ref 11.5–15.5)
WBC: 3.1 10*3/uL — ABNORMAL LOW (ref 4.0–10.5)
nRBC: 0 % (ref 0.0–0.2)

## 2023-01-04 LAB — I-STAT CHEM 8, ED
BUN: 21 mg/dL (ref 8–23)
Calcium, Ion: 1 mmol/L — ABNORMAL LOW (ref 1.15–1.40)
Chloride: 101 mmol/L (ref 98–111)
Creatinine, Ser: 1.7 mg/dL — ABNORMAL HIGH (ref 0.61–1.24)
Glucose, Bld: 211 mg/dL — ABNORMAL HIGH (ref 70–99)
HCT: 29 % — ABNORMAL LOW (ref 39.0–52.0)
Hemoglobin: 9.9 g/dL — ABNORMAL LOW (ref 13.0–17.0)
Potassium: 3.4 mmol/L — ABNORMAL LOW (ref 3.5–5.1)
Sodium: 135 mmol/L (ref 135–145)
TCO2: 21 mmol/L — ABNORMAL LOW (ref 22–32)

## 2023-01-04 NOTE — Progress Notes (Signed)
Orthopedic Tech Progress Note Patient Details:  Brian [name] Jun 25, 1955 [id number]  Level II trauma, ortho techs not needed at this time.  Patient ID: Brian [name], male   DOB: 1955/05/25, 67 y.o.   MRN: [id number]  Docia Furl 01/04/2023, 2:16 PM

## 2023-01-04 NOTE — Progress Notes (Signed)
Responded to page to support patient Fall on thinners.  Chaplain provided emotional and spiritual support.  Chaplain available as needed.    Venida Jarvis, [place], [facility], Pager [phone]

## 2023-01-04 NOTE — Discharge Instructions (Signed)
You were seen for after fall in the emergency department.   At home, please take Tylenol for pain.  Use Neosporin and Band-Aids on your elbow wound.  Check your MyChart online for the results of any tests that had not resulted by the time you left the emergency department.   Follow-up with your primary doctor in 2-3 days regarding your visit.    Return immediately to the emergency department if you experience any of the following: Severe headache, uncontrolled vomiting, or any other concerning symptoms.    Thank you for visiting our Emergency Department. It was a pleasure taking care of you today.

## 2023-01-04 NOTE — ED Provider Notes (Signed)
[place] [facility] DEPARTMENT AT [facility] Provider Note   CSN: [id number] Arrival date & time: 01/04/23  1229     History Chief Complaint  Patient presents with   Trauma    HPI Brian [name] is a 67 y.o. male presenting for chief complaint of ground-level fall.  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  He has a hematoma on the side of his scalp.  He denies any symptoms.  He does take Eliquis and is compliant this morning. Denies fevers chills nausea vomiting shortness of breath..   Patient's recorded medical, surgical, social, medication list and allergies were reviewed in the Snapshot window as part of the initial history.   Review of Systems   Review of Systems  Constitutional:  Negative for chills and fever.  HENT:  Negative for ear pain and sore throat.   Eyes:  Negative for pain and visual disturbance.  Respiratory:  Negative for cough and shortness of breath.   Cardiovascular:  Negative for chest pain and palpitations.  Gastrointestinal:  Negative for abdominal pain and vomiting.  Genitourinary:  Negative for dysuria and hematuria.  Musculoskeletal:  Negative for arthralgias and back pain.  Skin:  Negative for color change and rash.  Neurological:  Negative for seizures and syncope.  All other systems reviewed and are negative.   Physical Exam Updated Vital Signs BP (!) 146/100   Pulse 88   Temp 98.5 F (36.9 C) (Oral)   Resp 19   Ht 5\' 9"  (1.753 m)   Wt 73.8 kg   SpO2 100%   BMI 24.03 kg/m  Physical Exam  Neurologic: GCS 15, motor intact in all four extremities, sensory intact in all 4 extremities  Head: Pupils are 3mm, equally round and reactive to light, patient has no obvious facial trauma, no hemotympanum  Neck: patient has no midline neck tenderness, no obvious injuries.  Thorax: Patient has stable clavicles, stable thorax with bilateral chest rise and breath sounds heard.  No  penetrating thoracic injury.  CV/Pulm: RRR, no audible murmer/rubs/gallops, CTAB  Abdomen: Patient has no abdominal distention, no penetrating abdominal injury.  Back: Patient has no midline spinal tenderness in the thoracic and lumbar spine, patient has no paraspinal tenderness bilaterally.  Pelvis: Patient has a stable pelvis to compression with palpable femoral pulses.  Extremities:Patient's upper extremities with no obvious injury or abnormality, radial pulses present. Patient's lower extremities with no obvious injury or abnormality, tibial pulses present.     ED Course/ Medical Decision Making/ A&P    Procedures Procedures   Medications Ordered in ED Medications - No data to display Medical Decision Making:    MIKE [name] is a 66 y.o. male who presented to the ED today with a high mechanisma trauma, detailed above.    By institutional and departmental policy this was activated as a level 2 trauma. Additional history discussed with patient's family/caregivers.  Patient placed on continuous vitals and telemetry monitoring while in ED which was reviewed periodically.   Given this mechanism of trauma, a full physical exam was performed.  Reviewed and confirmed nursing documentation for past medical history, family history, social history.    Initial Assessment/Plan:   I was called emergently to patient's bedside for a primary survey.  Primary survey: Airway intact.  BL breath sounds present.   Circulation established with WNL BP, 2 large bore IVs, and radial/femoral pulses.   Disability evaluation negative. No obvious  disability requiring intervention.   Patient fully exposed and all injuries were noted, any penetrating injuries were labeled with radiopaque markers.  No emergent interventions took place in the primary survey.    Patient stable for CXR that demonstrated no traumatic hemopneumothorax and PXR that demonstrated no unstable pelvic fractures.  EFAST deferred.    Secondary survey: Once patient was stabilized, I personally performed a secondary survey to evaluate for any other injuries.  Results of this evaluation documented in the physical exam section. This is a patient presenting with a high mechanism trauma.  As such, I have considered intracranial injuries including intracranial hemorrhage, intrathoracic injuries including blunt myocardial or blunt lung injury, blunt abdominal injuries including aortic dissection, bladder injury, spleen injury, liver injury and I have considered orthopedic injuries including extremity or spinal injury.   This was all evaluated by the below imaging as well as concurrently ordered laboratory evaluation which was reviewed.  Radiology: All radiology results were reviewed independently and agree with reads per radiology provider. CT HEAD WO CONTRAST ( )  Result Date: 01/04/2023 CLINICAL DATA:  Head trauma, minor (Age >= 65y); Polytrauma, blunt EXAM: CT HEAD WITHOUT CONTRAST CT CERVICAL SPINE WITHOUT CONTRAST TECHNIQUE: Multidetector CT imaging of the head and cervical spine was performed following the standard protocol without intravenous contrast. Multiplanar CT image reconstructions of the cervical spine were also generated. RADIATION DOSE REDUCTION: This exam was performed according to the departmental dose-optimization program which includes automated exposure control, adjustment of the mA and/or kV according to patient size and/or use of iterative reconstruction technique. 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 No evidence of acute hemorrhage, acute large vascular territory infarct, mass lesion or midline shift. No hydrocephalus. Vascular: No hyperdense vessel. Skull: No acute fracture.  Left forehead contusion. Sinuses/Orbits: Clear sinuses.  No acute  abnormalities. CT CERVICAL SPINE FINDINGS Alignment: Reversal of the normal cervical lordosis. Mild anterolisthesis of C7 on T1, likely degenerative. Skull base and vertebrae: No acute fracture. Soft tissues and spinal canal: No prevertebral fluid or swelling. No visible canal hematoma. Disc levels: Large to severe multilevel degenerative change including degenerative disc height loss, endplate spurring and facet/uncovertebral hypertrophy. Upper chest: Layering left pleural effusion. IMPRESSION: 1. No evidence of acute abnormality intracranially or in the cervical spine. 2. Left forehead contusion. 3. Remote infarcts. 4. Layering left pleural effusion. Electronically Signed   By: Feliberto Harts M.D.   On: 01/04/2023 13:49   CT CERVICAL SPINE WO CONTRAST  Result Date: 01/04/2023 CLINICAL DATA:  Head trauma, minor (Age >= 65y); Polytrauma, blunt EXAM: CT HEAD WITHOUT CONTRAST CT CERVICAL SPINE WITHOUT CONTRAST TECHNIQUE: Multidetector CT imaging of the head and cervical spine was performed following the standard protocol without intravenous contrast. Multiplanar CT image reconstructions of the cervical spine were also generated. RADIATION DOSE REDUCTION: This exam was performed according to the departmental dose-optimization program which includes automated exposure control, adjustment of the mA and/or kV according to patient size and/or use of iterative reconstruction technique. 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 No evidence of acute hemorrhage, acute large vascular territory infarct, mass lesion or midline shift. No hydrocephalus. Vascular: No hyperdense vessel. Skull: No acute fracture.  Left forehead contusion. Sinuses/Orbits: Clear sinuses.  No acute abnormalities. CT CERVICAL SPINE FINDINGS  Alignment: Reversal of the normal cervical  lordosis. Mild anterolisthesis of C7 on T1, likely degenerative. Skull base and vertebrae: No acute fracture. Soft tissues and spinal canal: No prevertebral fluid or swelling. No visible canal hematoma. Disc levels: Large to severe multilevel degenerative change including degenerative disc height loss, endplate spurring and facet/uncovertebral hypertrophy. Upper chest: Layering left pleural effusion. IMPRESSION: 1. No evidence of acute abnormality intracranially or in the cervical spine. 2. Left forehead contusion. 3. Remote infarcts. 4. Layering left pleural effusion. Electronically Signed   By: Feliberto Harts M.D.   On: 01/04/2023 13:49   CT CHEST ABDOMEN PELVIS W CONTRAST  Result Date: 12/13/2022 CLINICAL DATA:  Restaging non-small cell lung cancer. * Tracking Code: BO * EXAM: CT CHEST, ABDOMEN, AND PELVIS WITH CONTRAST TECHNIQUE: Multidetector CT imaging of the chest, abdomen and pelvis was performed following the standard protocol during bolus administration of intravenous contrast. RADIATION DOSE REDUCTION: This exam was performed according to the departmental dose-optimization program which includes automated exposure control, adjustment of the mA and/or kV according to patient size and/or use of iterative reconstruction technique. CONTRAST:  75mL OMNIPAQUE IOHEXOL 300 MG/ML  SOLN COMPARISON:  PET-CT 09/10/2022 and chest CT 07/25/2022 FINDINGS: CT CHEST FINDINGS Cardiovascular: The heart is upper limits of in size. Small pericardial effusion persists. Stable age advanced atherosclerotic calcification involving the aorta and iliac arteries. Mediastinum/Nodes: Progressive mediastinal and hilar lymphadenopathy. Cluster of high right paratracheal nodes demonstrating significant enlargement since the prior PET-CT. The largest node measures 12 mm on image 23/2 and previously measured 5 mm. Precarinal lymph node on image 28/2 measures 25 mm and previously measured 18 mm. AP window adenopathy with largest  node measuring 26 mm and previously measuring 24 mm. The more anterior node measures 20 mm and previously measured 13 mm. It appears necrotic. Difficult to measure the left infrahilar mass or adenopathy blending in with complete left lower lobe atelectasis/collapse due to left lower lobe bronchus obstruction. I would estimate the maximum width is 27 mm and was previously 24 mm. No supraclavicular or axillary adenopathy. Lungs/Pleura: Persistent obstruction of the left lower lobe bronchus with complete left lower lobe atelectasis and surrounding pleural effusion. Two small right middle lobe pulmonary nodules are stable. Stable small right lower lobe subpleural calcified granuloma. No new pulmonary lesions new pulmonary nodules. Musculoskeletal: No significant bony findings. No lytic or sclerotic bone lesions. Remote healed sternal fracture. CT ABDOMEN PELVIS FINDINGS Hepatobiliary: No hepatic lesions to suggest metastatic disease. The gallbladder is unremarkable. No common bile duct dilatation. Pancreas: Unremarkable. No pancreatic ductal dilatation or surrounding inflammatory changes. Spleen: Normal size.  No focal lesions. Adrenals/Urinary Tract: No adrenal gland lesions to suggest metastatic disease. No renal lesions bladder lesions. Stomach/Bowel: The stomach, duodenum, small bowel and colon are grossly normal without oral contrast. No inflammatory changes, mass lesions or obstructive findings. Vascular/Lymphatic: Stable advanced vascular disease but no aneurysm dissection. No mesenteric or retroperitoneal or pelvic adenopathy. Reproductive: The prostate gland and seminal vesicles are unremarkable. Other: Stable small periumbilical abdominal wall hernia containing fat. Musculoskeletal: No significant bony findings. Stable degenerative changes involving the lumbar spine. IMPRESSION: 1. Progressive mediastinal and hilar lymphadenopathy. 2. Persistent obstruction of the left lower lobe bronchus with complete left  lower lobe atelectasis/collapse and surrounding pleural effusion. 3. Stable small right middle lobe pulmonary nodules. 4. No findings for abdominal/pelvic metastatic disease. Aortic Atherosclerosis (ICD10-I70.0). Electronically Signed   By: Rudie Meyer M.D.   On: 12/13/2022 09:47    Final Reassessment and Plan:  Wound cleaned and bandaged. No acute pathology detected on his initial evaluation. CT head and neck are still pending.  If no focal pathology are found in the studies, patient will be stable for outpatient care management.  Patient is requesting discharge as soon as possible and has already called to get a ride home as soon as his studies are negative.     Emergency Department Medication Summary:   Medications - No data to display        Clinical Impression:  1. Trauma      Data Unavailable   Final Clinical Impression(s) / ED Diagnoses Final diagnoses:  Trauma    Rx / DC Orders ED Discharge Orders     None         Glyn Ade, MD 01/04/23 [phone]

## 2023-01-04 NOTE — ED Provider Notes (Signed)
  Physical Exam  BP (!) 146/100   Pulse 88   Temp 98.5 F (36.9 C) (Oral)   Resp 19   Ht 5\' 9"  (1.753 m)   Wt 73.8 kg   SpO2 100%   BMI 24.03 kg/m   Physical Exam  Procedures  Procedures  ED Course / MDM   Clinical Course as of 01/04/23 1519  Fri Jan 04, 2023  1511 Assumed care from Dr Doran Durand. 67 yo M with eliquis use who presented after a mechanical fall. Has elbow pain. CT head and c-spine were ok. Awaiting plain film read.  [RP]    Clinical Course User Index [RP] Rondel Baton, MD   Medical Decision Making Amount and/or Complexity of Data Reviewed Labs: ordered. Radiology: ordered.   ***

## 2023-01-04 NOTE — ED Triage Notes (Signed)
Patient presents to ed via [facility] states he was at [employer] and loss his balance and fell. Hit his head on a wooden bench denies loc also has skin tear on his left elbow, patient is on eliquis .

## 2023-01-04 NOTE — ED Notes (Signed)
PT requested apple juice.  PT was not allowed to eat or drink due to the CT results not back.

## 2023-01-04 NOTE — ED Notes (Signed)
Pt was no longer in the room.  Unsure of where the Pt went.

## 2023-01-04 NOTE — ED Notes (Signed)
Pt went to stand up in the [employer].  He stated he lost his balance and fell.  Pt did hit his head on the ground, but had no LOC.  Pt was on a blood thinner and had a hematoma to left side of his forehead about 1.5in in diameter.

## 2023-01-06 ENCOUNTER — Encounter: Payer: Self-pay | Admitting: Family Medicine

## 2023-01-07 ENCOUNTER — Encounter (HOSPITAL_COMMUNITY): Payer: Self-pay | Admitting: Emergency Medicine

## 2023-01-07 ENCOUNTER — Observation Stay (HOSPITAL_COMMUNITY): Payer: Medicare HMO

## 2023-01-07 ENCOUNTER — Ambulatory Visit (HOSPITAL_COMMUNITY): Payer: Medicare HMO

## 2023-01-07 ENCOUNTER — Observation Stay (HOSPITAL_COMMUNITY)
Admission: EM | Admit: 2023-01-07 | Discharge: 2023-01-10 | Disposition: A | Discharge status: Skilled Nursing Facility | Payer: Medicare HMO | Attending: Internal Medicine | Admitting: Internal Medicine

## 2023-01-07 ENCOUNTER — Other Ambulatory Visit: Payer: Self-pay

## 2023-01-07 ENCOUNTER — Other Ambulatory Visit (HOSPITAL_COMMUNITY): Payer: Medicare HMO

## 2023-01-07 ENCOUNTER — Emergency Department (HOSPITAL_COMMUNITY): Payer: Medicare HMO

## 2023-01-07 ENCOUNTER — Observation Stay (HOSPITAL_BASED_OUTPATIENT_CLINIC_OR_DEPARTMENT_OTHER): Payer: Medicare HMO

## 2023-01-07 DIAGNOSIS — E876 Hypokalemia: Secondary | ICD-10-CM

## 2023-01-07 DIAGNOSIS — R269 Unspecified abnormalities of gait and mobility: Secondary | ICD-10-CM | POA: Diagnosis not present

## 2023-01-07 DIAGNOSIS — E785 Hyperlipidemia, unspecified: Secondary | ICD-10-CM | POA: Diagnosis not present

## 2023-01-07 DIAGNOSIS — G9389 Other specified disorders of brain: Secondary | ICD-10-CM | POA: Diagnosis not present

## 2023-01-07 DIAGNOSIS — W19XXXA Unspecified fall, initial encounter: Secondary | ICD-10-CM | POA: Diagnosis not present

## 2023-01-07 DIAGNOSIS — M47816 Spondylosis without myelopathy or radiculopathy, lumbar region: Secondary | ICD-10-CM | POA: Diagnosis not present

## 2023-01-07 DIAGNOSIS — C44729 Squamous cell carcinoma of skin of left lower limb, including hip: Secondary | ICD-10-CM | POA: Diagnosis not present

## 2023-01-07 DIAGNOSIS — I6930 Unspecified sequelae of cerebral infarction: Secondary | ICD-10-CM | POA: Diagnosis not present

## 2023-01-07 DIAGNOSIS — G40909 Epilepsy, unspecified, not intractable, without status epilepticus: Secondary | ICD-10-CM | POA: Diagnosis not present

## 2023-01-07 DIAGNOSIS — J90 Pleural effusion, not elsewhere classified: Secondary | ICD-10-CM | POA: Diagnosis not present

## 2023-01-07 DIAGNOSIS — Z7984 Long term (current) use of oral hypoglycemic drugs: Secondary | ICD-10-CM | POA: Insufficient documentation

## 2023-01-07 DIAGNOSIS — Z79899 Other long term (current) drug therapy: Secondary | ICD-10-CM | POA: Insufficient documentation

## 2023-01-07 DIAGNOSIS — E119 Type 2 diabetes mellitus without complications: Secondary | ICD-10-CM

## 2023-01-07 DIAGNOSIS — R262 Difficulty in walking, not elsewhere classified: Secondary | ICD-10-CM | POA: Insufficient documentation

## 2023-01-07 DIAGNOSIS — Z8659 Personal history of other mental and behavioral disorders: Secondary | ICD-10-CM | POA: Diagnosis not present

## 2023-01-07 DIAGNOSIS — E1169 Type 2 diabetes mellitus with other specified complication: Secondary | ICD-10-CM | POA: Diagnosis not present

## 2023-01-07 DIAGNOSIS — I700 Atherosclerosis of aorta: Secondary | ICD-10-CM | POA: Diagnosis not present

## 2023-01-07 DIAGNOSIS — R918 Other nonspecific abnormal finding of lung field: Secondary | ICD-10-CM | POA: Diagnosis not present

## 2023-01-07 DIAGNOSIS — M545 Low back pain, unspecified: Secondary | ICD-10-CM | POA: Diagnosis not present

## 2023-01-07 DIAGNOSIS — I3139 Other pericardial effusion (noninflammatory): Secondary | ICD-10-CM | POA: Diagnosis not present

## 2023-01-07 DIAGNOSIS — I480 Paroxysmal atrial fibrillation: Secondary | ICD-10-CM

## 2023-01-07 DIAGNOSIS — I4891 Unspecified atrial fibrillation: Secondary | ICD-10-CM | POA: Insufficient documentation

## 2023-01-07 DIAGNOSIS — R569 Unspecified convulsions: Secondary | ICD-10-CM

## 2023-01-07 DIAGNOSIS — E1122 Type 2 diabetes mellitus with diabetic chronic kidney disease: Secondary | ICD-10-CM | POA: Diagnosis not present

## 2023-01-07 DIAGNOSIS — W19XXXD Unspecified fall, subsequent encounter: Secondary | ICD-10-CM | POA: Diagnosis not present

## 2023-01-07 DIAGNOSIS — I6381 Other cerebral infarction due to occlusion or stenosis of small artery: Secondary | ICD-10-CM | POA: Diagnosis not present

## 2023-01-07 DIAGNOSIS — Y92009 Unspecified place in unspecified non-institutional (private) residence as the place of occurrence of the external cause: Secondary | ICD-10-CM

## 2023-01-07 DIAGNOSIS — M6281 Muscle weakness (generalized): Secondary | ICD-10-CM | POA: Diagnosis not present

## 2023-01-07 DIAGNOSIS — I10 Essential (primary) hypertension: Secondary | ICD-10-CM | POA: Diagnosis not present

## 2023-01-07 DIAGNOSIS — N1832 Chronic kidney disease, stage 3b: Secondary | ICD-10-CM

## 2023-01-07 DIAGNOSIS — D61818 Other pancytopenia: Secondary | ICD-10-CM

## 2023-01-07 DIAGNOSIS — I5042 Chronic combined systolic (congestive) and diastolic (congestive) heart failure: Secondary | ICD-10-CM

## 2023-01-07 DIAGNOSIS — Z8673 Personal history of transient ischemic attack (TIA), and cerebral infarction without residual deficits: Secondary | ICD-10-CM | POA: Insufficient documentation

## 2023-01-07 DIAGNOSIS — I129 Hypertensive chronic kidney disease with stage 1 through stage 4 chronic kidney disease, or unspecified chronic kidney disease: Secondary | ICD-10-CM | POA: Diagnosis not present

## 2023-01-07 DIAGNOSIS — S0990XA Unspecified injury of head, initial encounter: Secondary | ICD-10-CM | POA: Diagnosis not present

## 2023-01-07 DIAGNOSIS — M25552 Pain in left hip: Secondary | ICD-10-CM | POA: Diagnosis not present

## 2023-01-07 DIAGNOSIS — N183 Chronic kidney disease, stage 3 unspecified: Secondary | ICD-10-CM | POA: Diagnosis present

## 2023-01-07 DIAGNOSIS — F1090 Alcohol use, unspecified, uncomplicated: Secondary | ICD-10-CM | POA: Diagnosis not present

## 2023-01-07 DIAGNOSIS — I63511 Cerebral infarction due to unspecified occlusion or stenosis of right middle cerebral artery: Secondary | ICD-10-CM | POA: Diagnosis not present

## 2023-01-07 DIAGNOSIS — R9431 Abnormal electrocardiogram [ECG] [EKG]: Secondary | ICD-10-CM | POA: Diagnosis not present

## 2023-01-07 DIAGNOSIS — R079 Chest pain, unspecified: Secondary | ICD-10-CM | POA: Diagnosis not present

## 2023-01-07 DIAGNOSIS — R000 Tachycardia, unspecified: Secondary | ICD-10-CM | POA: Diagnosis not present

## 2023-01-07 DIAGNOSIS — R55 Syncope and collapse: Secondary | ICD-10-CM | POA: Diagnosis not present

## 2023-01-07 DIAGNOSIS — E782 Mixed hyperlipidemia: Secondary | ICD-10-CM

## 2023-01-07 DIAGNOSIS — I6782 Cerebral ischemia: Secondary | ICD-10-CM | POA: Diagnosis not present

## 2023-01-07 DIAGNOSIS — R42 Dizziness and giddiness: Secondary | ICD-10-CM | POA: Diagnosis not present

## 2023-01-07 LAB — ECHOCARDIOGRAM COMPLETE
AR max vel: 2.38 cm2
AV Area VTI: 2.32 cm2
AV Area mean vel: 2.37 cm2
AV Mean grad: 2 mm[Hg]
AV Peak grad: 3.7 mm[Hg]
Ao pk vel: 0.96 m/s
Area-P 1/2: 4.29 cm2
Height: 69 in
S' Lateral: 2.7 cm
Weight: 2603.19 [oz_av]

## 2023-01-07 LAB — CBC WITH DIFFERENTIAL/PLATELET
Abs Immature Granulocytes: 0.01 10*3/uL (ref 0.00–0.07)
Basophils Absolute: 0 10*3/uL (ref 0.0–0.1)
Basophils Relative: 1 %
Eosinophils Absolute: 0 10*3/uL (ref 0.0–0.5)
Eosinophils Relative: 1 %
HCT: 28 % — ABNORMAL LOW (ref 39.0–52.0)
Hemoglobin: 9 g/dL — ABNORMAL LOW (ref 13.0–17.0)
Immature Granulocytes: 1 %
Lymphocytes Relative: 6 %
Lymphs Abs: 0.1 10*3/uL — ABNORMAL LOW (ref 0.7–4.0)
MCH: 27.4 pg (ref 26.0–34.0)
MCHC: 32.1 g/dL (ref 30.0–36.0)
MCV: 85.4 fL (ref 80.0–100.0)
Monocytes Absolute: 0.3 10*3/uL (ref 0.1–1.0)
Monocytes Relative: 12 %
Neutro Abs: 1.8 10*3/uL (ref 1.7–7.7)
Neutrophils Relative %: 79 %
Platelets: 184 10*3/uL (ref 150–400)
RBC: 3.28 MIL/uL — ABNORMAL LOW (ref 4.22–5.81)
RDW: 13.6 % (ref 11.5–15.5)
WBC: 2.2 10*3/uL — ABNORMAL LOW (ref 4.0–10.5)
nRBC: 0 % (ref 0.0–0.2)

## 2023-01-07 LAB — COMPREHENSIVE METABOLIC PANEL
ALT: 10 U/L (ref 0–44)
AST: 11 U/L — ABNORMAL LOW (ref 15–41)
Albumin: 2.5 g/dL — ABNORMAL LOW (ref 3.5–5.0)
Alkaline Phosphatase: 40 U/L (ref 38–126)
Anion gap: 11 (ref 5–15)
BUN: 25 mg/dL — ABNORMAL HIGH (ref 8–23)
CO2: 19 mmol/L — ABNORMAL LOW (ref 22–32)
Calcium: 8 mg/dL — ABNORMAL LOW (ref 8.9–10.3)
Chloride: 104 mmol/L (ref 98–111)
Creatinine, Ser: 1.73 mg/dL — ABNORMAL HIGH (ref 0.61–1.24)
GFR, Estimated: 42 mL/min — ABNORMAL LOW (ref >60)
Glucose, Bld: 117 mg/dL — ABNORMAL HIGH (ref 70–99)
Potassium: 3.2 mmol/L — ABNORMAL LOW (ref 3.5–5.1)
Sodium: 134 mmol/L — ABNORMAL LOW (ref 135–145)
Total Bilirubin: 0.6 mg/dL (ref <1.2)
Total Protein: 5.4 g/dL — ABNORMAL LOW (ref 6.5–8.1)

## 2023-01-07 LAB — HIV ANTIBODY (ROUTINE TESTING W REFLEX): HIV Screen 4th Generation wRfx: NONREACTIVE

## 2023-01-07 LAB — CBG MONITORING, ED: Glucose-Capillary: 106 mg/dL — ABNORMAL HIGH (ref 70–99)

## 2023-01-07 MED ORDER — EMPAGLIFLOZIN 25 MG PO TABS
25.0000 mg | ORAL_TABLET | Freq: Every day | ORAL | Status: DC
  Administered 2023-01-08 – 2023-01-10 (×3): 25 mg via ORAL
  Filled 2023-01-07 (×4): qty 1

## 2023-01-07 MED ORDER — HYDRALAZINE HCL 50 MG PO TABS
50.0000 mg | ORAL_TABLET | Freq: Three times a day (TID) | ORAL | Status: DC
  Administered 2023-01-07 – 2023-01-10 (×7): 50 mg via ORAL
  Filled 2023-01-07 (×8): qty 1

## 2023-01-07 MED ORDER — LOSARTAN POTASSIUM 50 MG PO TABS
100.0000 mg | ORAL_TABLET | Freq: Every day | ORAL | Status: DC
  Administered 2023-01-08 – 2023-01-09 (×2): 100 mg via ORAL
  Filled 2023-01-07 (×2): qty 2

## 2023-01-07 MED ORDER — SODIUM CHLORIDE 0.9 % IV BOLUS
1000.0000 mL | Freq: Once | INTRAVENOUS | Status: AC
  Administered 2023-01-07: 1000 mL via INTRAVENOUS

## 2023-01-07 MED ORDER — ACETAMINOPHEN 650 MG RE SUPP: 650.0000 mg | Freq: Four times a day (QID) | RECTAL | Status: DC | PRN

## 2023-01-07 MED ORDER — LEVETIRACETAM IN NACL 1000 MG/100ML IV SOLN
1000.0000 mg | Freq: Once | INTRAVENOUS | Status: AC
  Administered 2023-01-07: 1000 mg via INTRAVENOUS
  Filled 2023-01-07: qty 100

## 2023-01-07 MED ORDER — ACETAMINOPHEN 325 MG PO TABS: 650.0000 mg | ORAL_TABLET | Freq: Four times a day (QID) | ORAL | Status: DC | PRN

## 2023-01-07 MED ORDER — DILTIAZEM HCL ER COATED BEADS 120 MG PO CP24
120.0000 mg | ORAL_CAPSULE | Freq: Two times a day (BID) | ORAL | Status: DC
  Administered 2023-01-08 – 2023-01-09 (×4): 120 mg via ORAL
  Filled 2023-01-07 (×6): qty 1

## 2023-01-07 MED ORDER — LORAZEPAM 2 MG/ML IJ SOLN: 1.0000 mg | INTRAMUSCULAR | Status: DC | PRN

## 2023-01-07 MED ORDER — LOSARTAN POTASSIUM 50 MG PO TABS: 100.0000 mg | ORAL_TABLET | Freq: Every day | ORAL | Status: DC

## 2023-01-07 MED ORDER — ALBUTEROL SULFATE (2.5 MG/3ML) 0.083% IN NEBU: 2.5000 mg | INHALATION_SOLUTION | Freq: Four times a day (QID) | RESPIRATORY_TRACT | Status: DC | PRN

## 2023-01-07 MED ORDER — APIXABAN 5 MG PO TABS
5.0000 mg | ORAL_TABLET | Freq: Two times a day (BID) | ORAL | Status: DC
  Administered 2023-01-07 – 2023-01-10 (×6): 5 mg via ORAL
  Filled 2023-01-07 (×6): qty 1

## 2023-01-07 MED ORDER — POTASSIUM CHLORIDE CRYS ER 20 MEQ PO TBCR
40.0000 meq | EXTENDED_RELEASE_TABLET | ORAL | Status: AC
  Administered 2023-01-07: 40 meq via ORAL
  Filled 2023-01-07: qty 2

## 2023-01-07 MED ORDER — LEVETIRACETAM 500 MG PO TABS
500.0000 mg | ORAL_TABLET | Freq: Two times a day (BID) | ORAL | Status: DC
  Administered 2023-01-07 – 2023-01-10 (×6): 500 mg via ORAL
  Filled 2023-01-07 (×6): qty 1

## 2023-01-07 MED ORDER — CARVEDILOL 12.5 MG PO TABS
25.0000 mg | ORAL_TABLET | Freq: Two times a day (BID) | ORAL | Status: DC
  Administered 2023-01-07 – 2023-01-08 (×2): 25 mg via ORAL
  Filled 2023-01-07 (×2): qty 2

## 2023-01-07 MED ORDER — ATORVASTATIN CALCIUM 10 MG PO TABS
20.0000 mg | ORAL_TABLET | Freq: Every day | ORAL | Status: DC
  Administered 2023-01-08 – 2023-01-10 (×3): 20 mg via ORAL
  Filled 2023-01-07 (×3): qty 2

## 2023-01-07 NOTE — ED Notes (Signed)
Went to MRI 

## 2023-01-07 NOTE — H&P (Addendum)
History and Physical    Patient: Brian [name] ZOX:[id number] DOB: 13-Dec-1955 DOA: 01/07/2023 DOS: the patient was seen and examined on 01/07/2023 PCP: [name], MD  Patient coming from: Home live with 69 y/o mother with dementia via EMS  Chief Complaint:  Chief Complaint  Patient presents with   seizure   HPI: Brian [name] is a 67 y.o. 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  He had a fall 3 days ago where he lost his balance hitting his head.  He was seen in the emergency department at that time and workup included CT scan of the head, CT of the cervical spine, chest x-ray, and pelvic x-ray which did not note any acute abnormality.  Since that time he had been complaining of pain in his right hip.  Normally patient ambulates with the use of a cane, but have been unable to do so and have been trying to use his mother's walker.  Daughter over the phone gives additional history notes that the patient had been not able to get up and ambulate on his is on and had been incontinent due to this.  This morning he had gotten up to sit on the side of the bed to eat his gravy biscuit when she reports he became stiff as a board kick over his table and had generalized shaking for 45 seconds.  There was no report of loss of bowel or bladder or tongue biting during this incident.  She took the patient a couple minutes to come back to himself.  His daughter makes note that he has been unable to readily care for himself and he lives with his demented mother who is 86 years old.  She request that he be evaluated to possibly go to a SNF for rehab facility.  Review of records note that patient  had a prior history of seizures with prior hospitalization back in 2014 noted to be in status epilepticus.  Ultimately patient was discharged home on Keppra 500 mg twice daily.  It seems that Keppra have been continued by his primary care provider at 250 mg twice daily up until 2016 where it looks like it was discontinued.    In the emergency department patient was noted to be afebrile with stable vital signs.  Labs noted WBC 2.2, hemoglobin 9, sodium 134, potassium 3.2, BUN 25, creatinine 1.73, glucose 117, and albumin 2.5.  CT scan of the head did not note any acute abnormality with old infarctions in the parietoccipital regions that was more extensive on the right than the left along with volume loss and encephalomalacia. Chest x-ray noted slightly increased left rectal cardiac opacity and effusion.  Bedside echocardiogram noted concern for pericardial effusion.  Cardiology had been consulted and recommended formal echocardiogram.  Patient had been given 1 L normal saline IV fluids.    Review of Systems: As mentioned in the history of present illness. All other systems reviewed and are negative. 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 [name], laser therapy OD   Seizures [facility])    pt reports this is from low blood sugar   Past Surgical History:  Procedure Laterality Date   BRONCHIAL NEEDLE ASPIRATION BIOPSY  10/30/2022   Procedure: BRONCHIAL NEEDLE ASPIRATION BIOPSIES;  Surgeon: Josephine Igo, DO;  Location: [facility] ENDOSCOPY;  Service: Cardiopulmonary;;   COLONOSCOPY     DIRECT LARYNGOSCOPY N/A 02/10/2020   Procedure: DIRECT LARYNGOSCOPY WITH BIOPSY;  Surgeon: Newman Pies, MD;  Location: [facility] OR;  Service: ENT;  Laterality:  N/A;   Ear surgery as a child     IR GASTROSTOMY TUBE MOD SED  03/23/2020   IR GASTROSTOMY TUBE REMOVAL  06/01/2020   IR IMAGING GUIDED PORT INSERTION  03/09/2020   IR REMOVAL TUN ACCESS W/ PORT W/O FL MOD SED  02/08/2021   THORACENTESIS  10/30/2022   Procedure: Alanson Puls;  Surgeon: Josephine Igo, DO;  Location: [facility] ENDOSCOPY;  Service: Cardiopulmonary;;  L pleural effusion   TONSILLECTOMY     VIDEO BRONCHOSCOPY Left 10/30/2022   Procedure: VIDEO BRONCHOSCOPY WITHOUT FLUORO;  Surgeon: Josephine Igo, DO;  Location: [facility] ENDOSCOPY;  Service: Cardiopulmonary;  Laterality: Left;   VIDEO BRONCHOSCOPY WITH ENDOBRONCHIAL ULTRASOUND Bilateral 10/30/2022   Procedure: VIDEO BRONCHOSCOPY WITH ENDOBRONCHIAL ULTRASOUND;  Surgeon: Josephine Igo, DO;  Location: [facility] ENDOSCOPY;  Service: Cardiopulmonary;  Laterality: Bilateral;   Social History:  reports that he has never smoked. He has never been exposed to tobacco smoke. He has never used smokeless tobacco. He reports current alcohol use. He reports that he does not currently use drugs after having used the following drugs: Marijuana. Frequency: 7.00 times per week.  Allergies  Allergen Reactions   Codeine Other (See Comments)    Unknown reaction, severe headach   Metformin And Related Diarrhea    Family History  Problem Relation Age of Onset   Colon cancer Mother    Hyperlipidemia Father    Hypertension Father    Breast cancer Sister    Coronary artery disease Neg Hx        no early CAD.   Stomach cancer Neg Hx    Rectal cancer Neg Hx    Esophageal cancer Neg Hx     Prior to Admission medications   Medication Sig Start Date End Date Taking? Authorizing Provider  apixaban (ELIQUIS) 5 MG TABS tablet Take 1 tablet (5 mg total) by mouth 2 (two) times daily. 10/16/22  Yes [name], MD  atorvastatin (LIPITOR) 20 MG tablet Take 1 tablet (20 mg total) by mouth daily. 05/28/22  Yes [name], MD  carvedilol (COREG) 25 MG tablet  Take 1 tablet (25 mg total) by mouth 2 (two) times daily. 10/16/22 01/14/23 Yes [name], MD  diltiazem (CARDIZEM CD) 120 MG 24 hr capsule Take 120 mg by mouth in the morning and at bedtime.   Yes [provider]  empagliflozin (JARDIANCE) 25 MG TABS tablet Take 1 tablet (25 mg total) by mouth daily before breakfast. 10/19/22  Yes [name], MD  furosemide (LASIX) 40 MG tablet Take 40 mg by mouth as needed for fluid or edema.   Yes [provider]  hydrALAZINE (APRESOLINE) 50 MG tablet Take 1 tablet (50 mg total) by mouth 3 (three) times daily. 01/01/23  Yes [name], MD  losartan (COZAAR) 100 MG tablet Take 1  tablet (100 mg total) by mouth daily. 10/16/22  Yes [name], MD  MAGNESIUM PO Take 1 tablet by mouth daily.   Yes [provider]  Continuous Blood Gluc Sensor (FREESTYLE LIBRE 2 SENSOR) MISC Use as directed to monitor blood glucose continuously. Change sensor Q 14 days. DX E11.65 04/11/20   [name], MD  diltiazem (CARDIZEM CD) 240 MG 24 hr capsule Take 1 capsule (240 mg total) by mouth daily. Patient not taking: Reported on 01/07/2023 05/28/22   [name], MD  doxazosin (CARDURA) 2 MG tablet TAKE 1 TABLET BY MOUTH EVERY DAY Patient not taking: Reported on 01/07/2023 10/19/22   [name], MD  glucose blood test strip 1 each by Other route as needed for other. Use as instructed 03/10/20   [name], MD  pioglitazone (ACTOS) 30 MG tablet Take 1 tablet (30 mg total) by mouth daily. Patient not taking: Reported on 01/07/2023 07/10/22   [name], MD    Physical Exam: Vitals:   01/07/23 1130 01/07/23 1134 01/07/23 1448  BP: 107/86  (!) 125/92  Pulse: 75  93  Resp: 19  18  Temp: 97.7 F (36.5 C)    SpO2: 99%  100%  Weight:  73.8 kg   Height:  5\' 9"  (1.753 m)    Constitutional: Elderly male who appears to be in no acute distress at this time Eyes: PERRL, lids and conjunctivae normal ENMT:  Mucous membranes are moist. Poor dentition. Neck: normal, supple, no masses  Respiratory: clear to auscultation bilaterally, no wheezing, no crackles. Normal respiratory effort. No accessory muscle use.  Cardiovascular: Irregular irregular. No extremity edema. 2+ pedal pulses. No carotid bruits.  Abdomen: no tenderness, no masses palpated. Bowel sounds positive.  Musculoskeletal: Contracture noted of the left upper extremity with limited range of motion.   Skin: no rashes, lesions, ulcers. No induration Neurologic: CN 2-12 grossly intact.  Decreased strength on the left upper extremity. Psychiatric: Normal judgment and insight. Alert and oriented x 3. Normal mood.   Data Reviewed:  EKG revealed atrial fibrillation at 113 bpm with PVCs.  Reviewed labs, imaging, and pertinent records as documented.  Assessment and Plan:  Seizure  History of seizure disorder Patient presents after having a reported seizure witnessed by his daughter who states that he stiffened up prior to falling back on the bed with generalized tonic-clonic activity.  Initial CT scan of the head did not note any acute abnormality.  Records note prior seizure disorder with prior hospitalization with status epilepticus back in 12/2012.  At that time he had been recommended to be on Keppra 500 mg twice daily.  It seems that the patient had been prescribed 250 mg twice daily by his primary up until 2016 when it was discontinued. -Admit to a progressive bed -Neurochecks -Load Keppra 1 g IV, then resume Keppra 500 mg twice daily -Patient advised per [facility] statutes, patients with seizures are not allowed to drive until they have been seizure-free for six months.  Advised patient on safety measures including use caution when using heavy equipment or power tools. Avoid working on ladders or at heights. Take showers instead of baths. Ensure the water temperature is not too high on the home water heater. Do not go swimming  alone. Do not lock yourself in a room alone (i.e. bathroom). When caring for infants or small children, sit down when holding, feeding, or changing them to minimize risk of injury to the child  in the event you have a seizure. Maintain good sleep hygiene. Avoid alcohol.  -Case had been discussed with Dr. Derry Lory who agreed with resuming Keppra. -Will need referral to follow-up with neurology in the outpatient setting  Fall at home Gait disturbance Patient reportedly had a fall 3 days ago while at home after reportedly losing his balance.  CT imaging of the brain as well as subsequent x-rays had not noted any acute fractures.  Since that time he has not been able to get up and ambulate.  Patient's daughter notes that he lives at home with his 69 year old demented mother for home she is unable to care for him and vice versa. -Check MRI of the brain -PT/OT to eval and treat -Transitions of care consulted for possible need of placement  Hypokalemia Acute.  Initial potassium noted to be 3.2.   -Give 40 meq of potassium chloride p.o. -Continue to monitor and replace as needed  Pericardial effusion ED provider had done a bedside echocardiogram noted concern for pericardial effusion.  Was initially thought patient's symptoms may have been related to him passing out and losing consciousness. -Check echocardiogram  Atrial fibrillation on chronic anticoagulation Patient appears to be in atrial fibrillation and rate controlled at this time. -Goal potassium at least 4 and magnesium at least 2.  Correct electrolytes as needed -Continue Eliquis, Cardizem, metoprolol as tolerated  Essential hypertension Blood pressures maintained 107/86 to 125/92.  At this point patient would likely be out of the permissive hypertension if had had a stroke as symptoms have been ongoing for several days. -Continue COVID, Cardizem, hydralazine, and losartan  Controlled diabetes mellitus type 2, without long-term use of  insulin On admission glucose noted to be 117.  Last hemoglobin A1c noted to be 6.3 on 10/16/2022. -Continue Jardiance -Heart healthy carb modified diet  Pancytopenia Chronic.  WBC 2.2 and hemoglobin 9 which appears near patient baseline.  Patient without fever. -Continue to monitor  Chronic kidney disease stage IIIb Creatinine noted to be 1.73 which appears around patient's baseline.  History of CVA with residual deficit Patient with prior right MCA stroke back in 2011 with residual left upper extremity weakness.    -Continue statin and Eliquis  Hyperlipidemia -Continue atorvastatin  Squamous cell cancer of the base of the tongue Squamous cell lung cancer Patient is followed in outpatient setting by Dr. [name] of oncology.  Patient had received chemoradiation for squamous cell cancer of the tongue and most recently completed 10 radiation treatments -Continue outpatient follow-up with oncology  DVT prophylaxis: Eliquis Advance Care Planning:   Code Status: Full Code    Consults: Cardiology  Family Communication: Daughter updated over the phone  Severity of Illness: The appropriate patient status for this patient is OBSERVATION. Observation status is judged to be reasonable and necessary in order to provide the required intensity of service to ensure the patient's safety. The patient's presenting symptoms, physical exam findings, and initial radiographic and laboratory data in the context of their medical condition is felt to place them at decreased risk for further clinical deterioration. Furthermore, it is anticipated that the patient will be medically stable for discharge from the hospital within 2 midnights of admission.   Author: Clydie Braun, MD 01/07/2023 3:08 PM  For on call review [email or web address].

## 2023-01-07 NOTE — ED Notes (Signed)
CAll Pershing Proud Daughter Emergency Contact [phone]   For updates and/or ride home if not admitted.

## 2023-01-07 NOTE — Progress Notes (Signed)
Awaiting PT.  

## 2023-01-07 NOTE — ED Notes (Signed)
CCMD contacted about room change.

## 2023-01-07 NOTE — ED Provider Notes (Signed)
[place] EMERGENCY DEPARTMENT AT [facility] Provider Note   CSN: [id number] Arrival date & time: 01/07/23  1114     History  Chief Complaint  Patient presents with   Seizures    [name] is a 67 y.o. male.  67 yo M with a chief complaint of a loss of consciousness.  This was just prior to him having breakfast in bed.  Per EMS the patient had a tensing lasted for about 30 seconds and then he was quickly back to baseline.  EMS reports a history of seizures that the patient does not know what I am talking about when I ask him about it.  He tells me he has never had a seizure as far as he knows.  He does not take Keppra though that is what EMS said that he was on and was reliably taking.  He is on Eliquis for atrial fibrillation.  He denies any injury with this seizure event.  Just fell couple days ago and was seen in the emergency department with unremarkable imaging.   Seizures      Home Medications Prior to Admission medications   Medication Sig Start Date End Date Taking? Authorizing Provider  apixaban (ELIQUIS) 5 MG TABS tablet Take 1 tablet (5 mg total) by mouth 2 (two) times daily. 10/16/22  Yes [name], MD  atorvastatin (LIPITOR) 20 MG tablet Take 1 tablet (20 mg total) by mouth daily. 05/28/22  Yes [name], MD  carvedilol (COREG) 25 MG tablet Take 1 tablet (25 mg total) by mouth 2 (two) times daily. 10/16/22 01/14/23 Yes [name], MD  diltiazem (CARDIZEM CD) 120 MG 24 hr capsule Take 120 mg by mouth in the morning and at bedtime.   Yes [provider]  empagliflozin (JARDIANCE) 25 MG TABS tablet Take 1 tablet (25 mg total) by mouth daily before breakfast. 10/19/22  Yes [name], MD  furosemide (LASIX) 40 MG tablet Take 40 mg by mouth as needed for fluid or edema.   Yes [provider]  hydrALAZINE (APRESOLINE) 50 MG tablet Take 1 tablet (50 mg total) by mouth 3 (three) times daily. 01/01/23  Yes [name], MD  losartan (COZAAR) 100 MG tablet Take 1 tablet (100 mg total) by mouth daily. 10/16/22  Yes [name], MD  MAGNESIUM PO Take 1 tablet by mouth daily.   Yes [provider]  Continuous Blood Gluc Sensor (FREESTYLE LIBRE 2 SENSOR) MISC Use as directed to monitor blood glucose continuously. Change sensor Q 14 days. DX E11.65 04/11/20   [name], MD  diltiazem (CARDIZEM CD) 240 MG 24 hr capsule Take 1 capsule (240 mg total) by mouth daily. Patient not taking: Reported on 01/07/2023 05/28/22   [name], MD  doxazosin (CARDURA) 2 MG tablet TAKE 1 TABLET BY MOUTH EVERY DAY Patient not taking: Reported on 01/07/2023 10/19/22   [name], MD  glucose blood test strip 1 each by Other route as needed for other. Use as instructed 03/10/20   [name], MD  pioglitazone (ACTOS) 30 MG tablet Take 1 tablet (30 mg total) by mouth daily. Patient not taking: Reported on 01/07/2023 07/10/22   [name], MD      Allergies    Codeine and Metformin and related    Review of Systems   Review of Systems  Neurological:  Positive for seizures.    Physical Exam Updated Vital Signs BP Marland Kitchen)  125/92   Pulse 93   Temp 97.7 F (36.5 C)   Resp 18   Ht 5\' 9"  (1.753 m)   Wt 73.8 kg   SpO2 100%   BMI 24.03 kg/m  Physical Exam Vitals and nursing note reviewed.  Constitutional:      Appearance: He is well-developed.  HENT:     Head: Normocephalic and atraumatic.  Eyes:     Pupils: Pupils are equal, round, and reactive to light.  Neck:     Vascular: No JVD.  Cardiovascular:     Rate and Rhythm: Normal rate and regular rhythm.     Heart sounds: No murmur heard.    No friction rub. No gallop.  Pulmonary:     Effort: No respiratory distress.     Breath sounds: No wheezing.  Abdominal:     General: There is no distension.     Tenderness: There is no abdominal tenderness. There is no guarding or rebound.  Musculoskeletal:        General: Normal  range of motion.     Cervical back: Normal range of motion and neck supple.     Comments: Pulse motor and sensation intact to left lower extremity.  Positive straight leg raise test.  Reflexes 2+ and equal.  No clonus.  Skin:    Coloration: Skin is not pale.     Findings: No rash.  Neurological:     Mental Status: He is alert and oriented to person, place, and time.  Psychiatric:        Behavior: Behavior normal.     ED Results / Procedures / Treatments   Labs (all labs ordered are listed, but only abnormal results are displayed) Labs Reviewed  CBC WITH DIFFERENTIAL/PLATELET - Abnormal; Notable for the following components:      Result Value   WBC 2.2 (*)    RBC 3.28 (*)    Hemoglobin 9.0 (*)    HCT 28.0 (*)    Lymphs Abs 0.1 (*)    All other components within normal limits  COMPREHENSIVE METABOLIC PANEL - Abnormal; Notable for the following components:   Sodium 134 (*)    Potassium 3.2 (*)    CO2 19 (*)    Glucose, Bld 117 (*)    BUN 25 (*)    Creatinine, Ser 1.73 (*)    Calcium 8.0 (*)    Total Protein 5.4 (*)    Albumin 2.5 (*)    AST 11 (*)    GFR, Estimated 42 (*)    All other components within normal limits  CBG MONITORING, ED - Abnormal; Notable for the following components:   Glucose-Capillary 106 (*)    All other components within normal limits  HIV ANTIBODY (ROUTINE TESTING W REFLEX)    EKG EKG Interpretation Date/Time:  Monday January 07 2023 11:32:14 EST Ventricular Rate:  113 PR Interval:    QRS Duration:  154 QT Interval:  408 QTC Calculation: 560 R Axis:   267  Text Interpretation: Atrial fibrillation Ventricular premature complex Right bundle branch block Inferior infarct, old No significant change since last tracing Confirmed by Melene Plan [phone]) on 01/07/2023 12:44:00 PM  Radiology DG Lumbar Spine Complete  Result Date: 01/07/2023 CLINICAL DATA:  Pain after fall EXAM: LUMBAR SPINE - COMPLETE 5 VIEW COMPARISON:  None Available. FINDINGS:  Five lumbar-type vertebral bodies. Preserved vertebral body heights and alignment. No listhesis. Prominent lower lumbar facet degenerative changes. Multilevel disc height loss, most severe at L2-3 but moderate L4-5  and L5-S1. Diffuse scattered endplate osteophytes as well. No spondylolysis. Prominent vascular calcifications. Overlapping cardiac leads. Recommend continue precautions until clinical clearance and if there is further concern of injury additional workup with CT as clinically appropriate for further sensitivity. IMPRESSION: Moderate degenerative changes. Electronically Signed   By: Karen Kays M.D.   On: 01/07/2023 14:19   DG Chest Port 1 View  Result Date: 01/07/2023 CLINICAL DATA:  Pain after fall. EXAM: PORTABLE CHEST 1 VIEW COMPARISON:  X-ray 01/04/2023. FINDINGS: Enlarged cardiopericardial silhouette. Slightly increased left retrocardiac opacity with small effusion. No pneumothorax or edema. No right-sided consolidation. Calcified aorta. Overlapping cardiac leads. IMPRESSION: Slightly increased left retrocardiac opacity and effusion. Recommend continued follow up Electronically Signed   By: Karen Kays M.D.   On: 01/07/2023 14:18   DG Hip Unilat W or Wo Pelvis 2-3 Views Left  Result Date: 01/07/2023 CLINICAL DATA:  Pain after fall EXAM: DG HIP (WITH OR WITHOUT PELVIS) 4V LEFT COMPARISON:  None Available. FINDINGS: No fracture or dislocation. Preserved joint spaces and bone mineralization. Hyperostosis. Diffuse vascular calcifications. IMPRESSION: No acute osseous abnormality. Electronically Signed   By: Karen Kays M.D.   On: 01/07/2023 14:16   CT Head Wo Contrast  Result Date: 01/07/2023 CLINICAL DATA:  Head trauma, minor EXAM: CT HEAD WITHOUT CONTRAST TECHNIQUE: Contiguous axial images were obtained from the base of the skull through the vertex without intravenous contrast. RADIATION DOSE REDUCTION: This exam was performed according to the departmental dose-optimization program  which includes automated exposure control, adjustment of the mA and/or kV according to patient size and/or use of iterative reconstruction technique. COMPARISON:  01/04/2023 FINDINGS: Brain: No change since the prior examination. No focal abnormality seen affecting the brainstem or cerebellum. Cerebral hemispheres show old infarctions in the parietooccipital regions, more extensive on the right than the left, with volume loss encephalomalacia. Chronic small-vessel ischemic changes are present otherwise within the cerebral hemispheric white matter. Old basal ganglia infarctions on the left. No sign of acute stroke, mass, hemorrhage, hydrocephalus or extra-axial collection. Vascular: There is atherosclerotic calcification of the major vessels at the base of the brain. Skull: Negative Sinuses/Orbits: Clear/normal Other: None IMPRESSION: No acute or traumatic finding. 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 Chronic small-vessel ischemic changes otherwise within the cerebral hemispheric white matter. Electronically Signed   By: Paulina Fusi M.D.   On: 01/07/2023 13:22    Procedures .Critical Care  Performed by: Melene Plan, DO Authorized by: Melene Plan, DO   Critical care provider statement:    Critical care time (minutes):  35   Critical care time was exclusive of:  Separately billable procedures and treating other patients   Critical care was time spent personally by me on the following activities:  Development of treatment plan with patient or surrogate, discussions with consultants, evaluation of patient's response to treatment, examination of patient, ordering and review of laboratory studies, ordering and review of radiographic studies, ordering and performing treatments and interventions, pulse oximetry, re-evaluation of patient's condition and review of old charts   Care discussed with: admitting provider         EMERGENCY DEPARTMENT [name] CARDIAC EXAM "Study: Limited Ultrasound of the Heart and Pericardium"  INDICATIONS: syncope Multiple views of the heart and pericardium were obtained in real-time with a multi-frequency probe.  PERFORMED [id number] IMAGES ARCHIVED?: Yes LIMITATIONS:  None VIEWS USED: Subcostal 4 chamber and Inferior Vena Cava INTERPRETATION: Cardiac activity present,  Pericardial effusion present, Amount of pericardial effusion moderate, and Cardiac tamponade absent    Medications Ordered in ED Medications  acetaminophen (TYLENOL) tablet 650 mg (has no administration in time range)    Or  acetaminophen (TYLENOL) suppository 650 mg (has no administration in time range)  albuterol (PROVENTIL) (2.5 MG/3ML) 0.083% nebulizer solution 2.5 mg (has no administration in time range)  sodium chloride 0.9 % bolus 1,000 mL (0 mLs Intravenous Stopped 01/07/23 1436)    ED Course/ Medical Decision Making/ A&P                                 Medical Decision Making Amount and/or Complexity of Data Reviewed Labs: ordered. Radiology: ordered. ECG/medicine tests: ordered.  Risk Decision regarding hospitalization.   67 yo M with a chief complaint of loss of consciousness.  By history it sounds that the patient had a vasovagal event.  He felt unwell lost consciousness and was found to be initially hypotensive with EMS.  He has improved over time.  He denies any recent illness.  Denies headache denies neck pain no extremity pain.  Lab work review I see that his daughter had called his primary care provider to try and get him into a rehab facility.  He tells me that he is doing fine at home he is not sure why she called.  CT head, labs, EKG  CT of the head without obvious acute intracranial pathology.  Patient initially was in atrial fibrillation with rapid ventricular response but improved with IV fluids.  Heart rate mostly in the 80s and 90s.  Renal function appears to be at baseline, no  significant electrolyte abnormalities.  No acute anemia.  Family has arrived and provides further history.  Tells me that has been having a lot of trouble getting up and moving around mostly secondary to the low back pain with radiation down the leg.  Will obtain a plain film of the L-spine and left hip.  Plain film independently interpreted by me without fracture.   Family feels like he is not safe to go home.  Will consult social work.  PT OT.  Chest x-ray independently interpreted by me with a odd appearing heart border.  Radiology read with likely effusion.  Bedside ultrasound is consistent with a pericardial effusion.  That with a syncopal event we will discuss with cardiology.  I discussed the case with the cards master, recommend medical admission and they will formally consult.  The patients results and plan were reviewed and discussed.   Any x-rays performed were independently reviewed by myself.   Differential diagnosis were considered with the presenting HPI.  Medications  acetaminophen (TYLENOL) tablet 650 mg (has no administration in time range)    Or  acetaminophen (TYLENOL) suppository 650 mg (has no administration in time range)  albuterol (PROVENTIL) (2.5 MG/3ML) 0.083% nebulizer solution 2.5 mg (has no administration in time range)  sodium chloride 0.9 % bolus 1,000 mL (0 mLs Intravenous Stopped 01/07/23 1436)    Vitals:   01/07/23 1130 01/07/23 1134 01/07/23 1448 01/07/23 1513  BP: 107/86  (!) 125/92   Pulse: 75  93   Resp: 19  18   Temp: 97.7 F (36.5 C)   97.7 F (36.5 C)  SpO2: 99%  100%   Weight:  73.8 kg    Height:  5\' 9"  (1.753 m)      Final diagnoses:  Pericardial effusion  Admission/ observation were discussed with the admitting physician, patient and/or family and they are comfortable with the plan.           Final Clinical Impression(s) / ED Diagnoses Final diagnoses:  Pericardial effusion    Rx / DC Orders ED Discharge  Orders     None         Melene Plan, DO 01/07/23 1542

## 2023-01-07 NOTE — ED Notes (Signed)
Sprite provided.  

## 2023-01-07 NOTE — ED Notes (Signed)
Pt returned back to room placed back on cardiac monitor. CCMD contacted.

## 2023-01-07 NOTE — ED Triage Notes (Signed)
Pt BIB [facility] from home for seizure like activity. Pt took morning meds and was about to have breakfast in bed when he suddenly stiffened up and fell back. Episode lasted approx. 30 sec.  Pt is in afib/RVR with hx of same. PT had fall on Friday for which he was evaluated.  Pt takes Eliquis and had all morning meds.  Pt has left side weakness r/t prev. Stroke.   Initial BP 90/62, 110/80 after 400 mL fluids, 97% , HR 90-160, CBG 148

## 2023-01-07 NOTE — ED Notes (Signed)
CCMD contacted for cardiac monitoring.

## 2023-01-07 NOTE — Consult Note (Signed)
Cardiology Consultation   Patient ID: Brian [name] MRN: [id number]; DOB: 1955/04/06  Admit date: 01/07/2023 Date of Consult: 01/07/2023  PCP:  [name], MD   [facility] Providers Cardiologist:  Rollene Rotunda, MD        Patient Profile:   Brian [name] is a 67 y.o. male with a hx of chronic systolic diastolic heart failure, CVA, hypertension, PAF, diabetes, and hyperlipidemia who is being seen 01/07/2023 for the evaluation of pericardial effusion at the request of Dr. Katrinka Blazing.  History of Present Illness:   Brian [name] was admitted after having a seizure.  He fell 3 days ago after losing his balance and hitting his head.  He was seen at the emergency department at the time and his evaluation was reportedly unremarkable.  Had significant pain and been unable to ambulate without using a walker since that time.  Is also had incontinence.  This morning during breakfast he had an episode of getting very stiff and having seizure-like activity.  His daughter brought him to the ED the possibility of going to a skilled nursing facility or rehab.  He does have a history of seizures and was hospitalized in 2014.  CT scan of the head was unremarkable other than prior infarcts.  It also revealed volume loss and encephalomalacia.  Chest x-ray was concerning for retrocardiac opacity and pericardial effusion.  He subsequently had an echocardiogram that revealed LVEF 45-50% with global hypokinesis and moderate LVH.  There is grade 2 diastolic dysfunction.  Ascending aorta was 4.0 cm.  He currently denies CP/SOB or palpitations.  He denies any history of seizure.  He reports that he was in his usual state of health when this occurred.   Brian [name] previously saw Dr. Antoine Poche for syncope.  He has a history of cardiomyopathy with LVEF 25-30% in 2011.  At the time he had a stroke that was treated with tPA.  He also has a history of atrial fibrillation with rapid ventricular  response.  He was last seen in clinic 03/2022 and was doing well.  LVEF recovered and his systolic function was 55-60% on echo 10/2021.  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 [name], laser therapy OD   Seizures [facility])    pt reports this is from low blood sugar    Past Surgical History:  Procedure Laterality Date   BRONCHIAL NEEDLE ASPIRATION BIOPSY  10/30/2022   Procedure: BRONCHIAL NEEDLE ASPIRATION BIOPSIES;  Surgeon: Josephine Igo, DO;  Location: [facility] ENDOSCOPY;  Service: Cardiopulmonary;;   COLONOSCOPY     DIRECT LARYNGOSCOPY N/A 02/10/2020   Procedure: DIRECT LARYNGOSCOPY WITH BIOPSY;  Surgeon: Newman Pies, MD;  Location: [facility] OR;  Service: ENT;  Laterality: N/A;   Ear surgery as a child     IR GASTROSTOMY TUBE MOD SED  03/23/2020   IR GASTROSTOMY TUBE REMOVAL  06/01/2020   IR IMAGING GUIDED PORT INSERTION  03/09/2020   IR REMOVAL TUN ACCESS W/ PORT W/O FL MOD SED  02/08/2021   THORACENTESIS  10/30/2022   Procedure: Alanson Puls;  Surgeon: Josephine Igo, DO;  Location: [facility] ENDOSCOPY;  Service: Cardiopulmonary;;  L pleural effusion   TONSILLECTOMY     VIDEO BRONCHOSCOPY Left 10/30/2022   Procedure: VIDEO BRONCHOSCOPY WITHOUT FLUORO;  Surgeon: Josephine Igo, DO;  Location: [facility] ENDOS[facility];  Service: Cardiopulmonary;  Laterality: Left;   VIDEO BRONCHOSCOPY WITH ENDOBRONCHIAL ULTRASOUND Bilateral 10/30/2022   Procedure: VIDEO BRONCHOSCOPY WITH ENDOBRONCHIAL ULTRASOUND;  Surgeon: Josephine Igo, DO;  Location: [facility] ENDOS[facility];  Service: Cardiopulmonary;  Laterality: Bilateral;     Home Medications:  Prior to Admission medications   Medication Sig Start Date End Date Taking? Authorizing Provider  apixaban  (ELIQUIS) 5 MG TABS tablet Take 1 tablet (5 mg total) by mouth 2 (two) times daily. 10/16/22  Yes [name], MD  atorvastatin (LIPITOR) 20 MG tablet Take 1 tablet (20 mg total) by mouth daily. 05/28/22  Yes [name], MD  carvedilol (COREG) 25 MG tablet Take 1 tablet (25 mg total) by mouth 2 (two) times daily. 10/16/22 01/14/23 Yes [name], MD  diltiazem (CARDIZEM CD) 120 MG 24 hr capsule Take 120 mg by mouth in the morning and at bedtime.   Yes [provider]  empagliflozin (JARDIANCE) 25 MG TABS tablet Take 1 tablet (25 mg total) by mouth daily before breakfast. 10/19/22  Yes [name], MD  furosemide (LASIX) 40 MG tablet Take 40 mg by mouth as needed for fluid or edema.   Yes [provider]  hydrALAZINE (APRESOLINE) 50 MG tablet Take 1 tablet (50 mg total) by mouth 3 (three) times daily. 01/01/23  Yes [name], MD  losartan (COZAAR) 100 MG tablet Take 1 tablet (100 mg total) by mouth daily. 10/16/22  Yes [name], MD  MAGNESIUM PO Take 1 tablet by mouth daily.   Yes [provider]  Continuous Blood Gluc Sensor (FREESTYLE LIBRE 2 SENSOR) MISC Use as directed to monitor blood glucose continuously. Change sensor Q 14 days. DX E11.65 04/11/20   [name], MD  diltiazem (CARDIZEM CD) 240 MG 24 hr capsule Take 1 capsule (240 mg total) by mouth daily. Patient not taking: Reported on 01/07/2023 05/28/22   Lynnea Ferrier T, MD  glucose blood test strip 1 each by Other route as needed for other. Use as instructed 03/10/20   [name], MD  pioglitazone (ACTOS) 30 MG tablet Take 1 tablet (30 mg total) by mouth daily. Patient not taking: Reported on 01/07/2023 07/10/22   [name], MD    Inpatient Medications: Scheduled Meds:  apixaban  5 mg Oral BID   [START ON 01/08/2023] atorvastatin  20 mg Oral Daily   carvedilol  25 mg Oral BID   diltiazem  120 mg Oral BID   [START ON 01/08/2023] empagliflozin  25 mg  Oral QAC breakfast   hydrALAZINE  50 mg Oral TID   levETIRAcetam  500 mg Oral BID   [START ON 01/08/2023] losartan  100 mg Oral Daily   potassium chloride  40 mEq Oral STAT   Continuous Infusions:  PRN Meds: acetaminophen **OR** acetaminophen, albuterol, LORazepam  Allergies:    Allergies  Allergen Reactions   Codeine Other (See Comments)    Unknown reaction, severe headach   Metformin And Related Diarrhea    Social History:   Social History   Socioeconomic History   Marital status: Divorced    Spouse name: Not on file   Number of children: 2   Years of education: 12   Highest education level: 12th grade  Occupational History  Occupation: Disabled  Tobacco Use   Smoking status: Never    Passive exposure: Never   Smokeless tobacco: Never   Tobacco comments:    Verified by son, Yahshua Hassani  Vaping Use   Vaping status: Never Used  Substance and Sexual Activity   Alcohol use: Yes    Alcohol/week: 0.0 standard drinks of alcohol    Comment: seldom   Drug use: Not Currently    Frequency: 7.0 times per week    Types: Marijuana    Comment: marijauna for arthritis, last 03-13-20   Sexual activity: Not Currently    Partners: Female  Other Topics Concern   Not on file  Social History Narrative   Two children.  5 grandchildren.     Social Determinants of Health   Financial Resource Strain: Low Risk  (04/05/2022)   Overall Financial Resource Strain (CARDIA)    Difficulty of Paying Living Expenses: Not hard at all  Food Insecurity: No Food Insecurity (01/07/2023)   Hunger Vital Sign    Worried About Running Out of Food in the Last Year: Never true    Ran Out of Food in the Last Year: Never true  Transportation Needs: No Transportation Needs (01/07/2023)   PRAPARE - Administrator, Civil Service (Medical): No    Lack of Transportation (Non-Medical): No  Physical Activity: Inactive (04/05/2022)   Exercise Vital Sign    Days of Exercise per Week: 0 days     Minutes of Exercise per Session: 0 min  Stress: No Stress Concern Present (04/05/2022)   [employer] of Occupational Health - Occupational Stress Questionnaire    Feeling of Stress : Not at all  Social Connections: Socially Isolated (04/05/2022)   Social Connection and Isolation Panel [NHANES]    Frequency of Communication with Friends and Family: Twice a week    Frequency of Social Gatherings with Friends and Family: Never    Attends Religious Services: Never    Database administrator or Organizations: No    Attends Banker Meetings: Never    Marital Status: Divorced  Catering manager Violence: Not At Risk (01/07/2023)   Humiliation, Afraid, Rape, and Kick questionnaire    Fear of Current or Ex-Partner: No    Emotionally Abused: No    Physically Abused: No    Sexually Abused: No    Family History:    Family History  Problem Relation Age of Onset   Colon cancer Mother    Hyperlipidemia Father    Hypertension Father    Breast cancer Sister    Coronary artery disease Neg Hx        no early CAD.   Stomach cancer Neg Hx    Rectal cancer Neg Hx    Esophageal cancer Neg Hx      ROS:  Please see the history of present illness.  All other ROS reviewed and negative.     Physical Exam/Data:   Vitals:   01/07/23 1513 01/07/23 1530 01/07/23 1600 01/07/23 1800  BP:  (!) 131/91 (!) 125/93 135/69  Pulse:  (!) 101 65 94  Resp:  19 (!) 21 (!) 29  Temp: 97.7 F (36.5 C)     SpO2:  99% 99% 99%  Weight:      Height:        Intake/Output Summary (Last 24 hours) at 01/07/2023 2017 Last data filed at 01/07/2023 1436 Gross per 24 hour  Intake 1000 ml  Output --  Net 1000 ml  01/07/2023   11:34 AM 01/04/2023   12:37 PM 12/14/2022    1:22 PM  Last 3 Weights  Weight (lbs) 162 lb 11.2 oz 162 lb 11.2 oz 162 lb 12.8 oz  Weight (kg) 73.8 kg 73.8 kg 73.846 kg     VS:  BP (!) 146/108   Pulse 86   Temp 98.3 F (36.8 C) (Oral)   Resp 14   Ht 5\' 9"  (1.753  m)   Wt 73.8 kg   SpO2 98%   BMI 24.03 kg/m  , BMI Body mass index is 24.03 kg/m. GENERAL:  Chronically ill-appearing.  Frail.  HEENT: Pupils equal round and reactive, fundi not visualized, oral mucosa unremarkable NECK:  No jugular venous distention, waveform within normal limits, carotid upstroke brisk and symmetric, no bruits, no thyromegaly LUNGS:  Clear to auscultation bilaterally HEART:  Tachycardic.  Irregularly irregular.  PMI not displaced or sustained,S1 and S2 within normal limits, no S3, no S4, no clicks, no rubs, no murmurs ABD:  Flat, positive bowel sounds normal in frequency in pitch, no bruits, no rebound, no guarding, no midline pulsatile mass, no hepatomegaly, no splenomegaly EXT:  2 plus pulses throughout, no edema, no cyanosis no clubbing SKIN:  No rashes no nodules NEURO:  Cranial nerves II through XII grossly intact, motor grossly intact throughout PSYCH:  Cognitively intact, oriented to person place and time  EKG:  The EKG was personally reviewed and demonstrates:  Atrial fibrillation.  Rate 113 bpm.  Right bundle branch block.  Prior inferior infarct. Telemetry:  Telemetry was personally reviewed and demonstrates:  Atrial fibrillation.  Rate 110s-120s  Relevant CV Studies:  Echo 01/07/23:  1. Left ventricular ejection fraction, by estimation, is 45 to 50%. The  left ventricle has mildly decreased function. The left ventricle has no  regional wall motion abnormalities. There is moderate left ventricular  hypertrophy. Left ventricular  diastolic parameters are consistent with Grade II diastolic dysfunction  (pseudonormalization). Elevated left atrial pressure. The E/e' is 21.   2. Right ventricular systolic function is mildly reduced. The right  ventricular size is normal.   3. Left atrial size was moderately dilated.   4. Right atrial size was dilated.   5. The mitral valve is normal in structure. Trivial mitral valve  regurgitation. No evidence of mitral  stenosis.   6. The aortic valve is tricuspid. Aortic valve regurgitation is not  visualized. Aortic valve sclerosis is present, with no evidence of aortic  valve stenosis.   7. Aortic dilatation noted. There is dilatation of the ascending aorta,  measuring 40 mm.   Echo 10/2021:  1. Left ventricular ejection fraction, by estimation, is 55 to 60%. The  left ventricle has normal function. The left ventricle has no regional  wall motion abnormalities. There is moderate concentric left ventricular  hypertrophy. Left ventricular  diastolic function could not be evaluated.   2. Right ventricular systolic function is normal. The right ventricular  size is normal. Tricuspid regurgitation signal is inadequate for assessing  PA pressure.   3. Left atrial size was moderately dilated.   4. Right atrial size was severely dilated.   5. The mitral valve is abnormal. Mild mitral valve regurgitation. No  evidence of mitral stenosis.   6. The aortic valve is tricuspid. There is mild calcification of the  aortic valve. There is moderate thickening of the aortic valve. Aortic  valve regurgitation is not visualized. Aortic valve  sclerosis/calcification is present, without any evidence of  aortic stenosis.  7. Aortic dilatation noted. There is borderline dilatation of the  ascending aorta, measuring 39 mm.   8. The inferior vena cava is normal in size with greater than 50%  respiratory variability, suggesting right atrial pressure of 3 mmHg.   Comparison(s): No significant change from prior study.   Conclusion(s)/Recommendation(s): Otherwise normal echocardiogram, with  minor abnormalities described in the report.   Laboratory Data:  High Sensitivity Troponin:  No results for input(s): "TROPONINIHS" in the last 720 hours.   Chemistry Recent Labs  Lab 01/01/23 1020 01/04/23 1242 01/04/23 1246 01/07/23 1149  NA 134* 135 134* 134*  K 3.3* 3.4* 3.3* 3.2*  CL 101 101 101 104  CO2 24  --  21*  19*  GLUCOSE 179* 211* 209* 117*  BUN 19 21 20  25*  CREATININE 1.64* 1.70* 1.62* 1.73*  CALCIUM 9.0  --  8.6* 8.0*  GFRNONAA 45*  --  46* 42*  ANIONGAP 9  --  12 11    Recent Labs  Lab 01/04/23 1246 01/07/23 1149  PROT 6.2* 5.4*  ALBUMIN 2.9* 2.5*  AST 12* 11*  ALT 12 10  ALKPHOS 51 40  BILITOT 0.7 0.6   Lipids No results for input(s): "CHOL", "TRIG", "HDL", "LABVLDL", "LDLCALC", "CHOLHDL" in the last 168 hours.  Hematology Recent Labs  Lab 01/04/23 1242 01/04/23 1413 01/07/23 1149  WBC  --  3.1* 2.2*  RBC  --  3.81* 3.28*  HGB 9.9* 10.6* 9.0*  HCT 29.0* 32.7* 28.0*  MCV  --  85.8 85.4  MCH  --  27.8 27.4  MCHC  --  32.4 32.1  RDW  --  13.5 13.6  PLT  --  205 184   Thyroid No results for input(s): "TSH", "FREET4" in the last 168 hours.  BNPNo results for input(s): "BNP", "PROBNP" in the last 168 hours.  DDimer No results for input(s): "DDIMER" in the last 168 hours.   Radiology/Studies:  ECHOCARDIOGRAM COMPLETE  Result Date: 01/07/2023    ECHOCARDIOGRAM REPORT   Patient Name:   Brian [name] Date of Exam: 01/07/2023 Medical Rec #:  [id number]         Height:       69.0 in Accession #:    [id number]        Weight:       162.7 lb Date of Birth:  29-Nov-1955          BSA:          1.892 m Patient Age:    67 years          BP:           125/92 mmHg Patient Gender: M                 HR:           108 bpm. Exam Location:  Inpatient Procedure: 2D Echo, Color Doppler and Cardiac Doppler Indications:    Pericardial Effusion  History:        Patient has prior history of Echocardiogram examinations, most                 recent 11/08/2021. CHF, Pericardial Disease, Stroke and Lung                 Cancer, Arrythmias:Atrial Fibrillation; Risk Factors:Diabetes                 and Dyslipidemia.  Sonographer:    Milbert Coulter Referring Phys: Tanit.Sneddon DAYNA N DUNN IMPRESSIONS  1. Left ventricular ejection fraction, by estimation, is 45 to 50%. The left ventricle has mildly decreased function.  The left ventricle has no regional wall motion abnormalities. There is moderate left ventricular hypertrophy. Left ventricular diastolic parameters are consistent with Grade II diastolic dysfunction (pseudonormalization). Elevated left atrial pressure. The E/e' is 21.  2. Right ventricular systolic function is mildly reduced. The right ventricular size is normal.  3. Left atrial size was moderately dilated.  4. Right atrial size was dilated.  5. The mitral valve is normal in structure. Trivial mitral valve regurgitation. No evidence of mitral stenosis.  6. The aortic valve is tricuspid. Aortic valve regurgitation is not visualized. Aortic valve sclerosis is present, with no evidence of aortic valve stenosis.  7. Aortic dilatation noted. There is dilatation of the ascending aorta, measuring 40 mm. Comparison(s): A prior study was performed on 11/08/2021. LVEF previously noted to be 55-60% and now 45-50%. See report for details. FINDINGS  Left Ventricle: Left ventricular ejection fraction, by estimation, is 45 to 50%. The left ventricle has mildly decreased function. The left ventricle has no regional wall motion abnormalities. The left ventricular internal cavity size was normal in size. There is moderate left ventricular hypertrophy. Left ventricular diastolic parameters are consistent with Grade II diastolic dysfunction (pseudonormalization). Elevated left atrial pressure. The E/e' is 21. Right Ventricle: The right ventricular size is normal. No increase in right ventricular wall thickness. Right ventricular systolic function is mildly reduced. Left Atrium: Left atrial size was moderately dilated. Right Atrium: Right atrial size was dilated. Pericardium: Trivial pericardial effusion is present. Mitral Valve: The mitral valve is normal in structure. Trivial mitral valve regurgitation. No evidence of mitral valve stenosis. Tricuspid Valve: The tricuspid valve is normal in structure. Tricuspid valve regurgitation is  trivial. No evidence of tricuspid stenosis. Aortic Valve: The aortic valve is tricuspid. Aortic valve regurgitation is not visualized. Aortic valve sclerosis is present, with no evidence of aortic valve stenosis. Aortic valve mean gradient measures 2.0 mmHg. Aortic valve peak gradient measures 3.7  mmHg. Aortic valve area, by VTI measures 2.32 cm. Pulmonic Valve: The pulmonic valve was not well visualized. Pulmonic valve regurgitation is not visualized. No evidence of pulmonic stenosis. Aorta: Aortic dilatation noted and the aortic root is normal in size and structure. There is dilatation of the ascending aorta, measuring 40 mm. IAS/Shunts: The atrial septum is grossly normal. Additional Comments: There is pleural effusion in the left lateral region.  LEFT VENTRICLE PLAX 2D LVIDd:         3.50 cm   Diastology LVIDs:         2.70 cm   LV e' medial:    5.22 cm/s LV PW:         1.40 cm   LV E/e' medial:  24.7 LV IVS:        1.40 cm   LV e' lateral:   7.72 cm/s LVOT diam:     2.10 cm   LV E/e' lateral: 16.7 LV SV:         38 LV SV Index:   20 LVOT Area:     3.46 cm  RIGHT VENTRICLE RV Basal diam:  3.40 cm RV Mid diam:    2.60 cm RV S prime:     6.74 cm/s TAPSE (M-mode): 1.2 cm LEFT ATRIUM             Index        RIGHT ATRIUM  Index LA diam:        4.10 cm 2.17 cm/m   RA Area:     21.70 cm LA Vol (A2C):   80.0 ml 42.27 ml/m  RA Volume:   60.30 ml  31.86 ml/m LA Vol (A4C):   81.3 ml 42.96 ml/m LA Biplane Vol: 82.3 ml 43.49 ml/m  AORTIC VALVE AV Area (Vmax):    2.38 cm AV Area (Vmean):   2.37 cm AV Area (VTI):     2.32 cm AV Vmax:           96.20 cm/s AV Vmean:          64.200 cm/s AV VTI:            0.166 m AV Peak Grad:      3.7 mmHg AV Mean Grad:      2.0 mmHg LVOT Vmax:         66.00 cm/s LVOT Vmean:        44.000 cm/s LVOT VTI:          0.111 m LVOT/AV VTI ratio: 0.67  AORTA Ao Root diam: 3.10 cm Ao Asc diam:  4.00 cm MITRAL VALVE MV Area (PHT): 4.29 cm     SHUNTS MV Decel Time: 177 msec      Systemic VTI:  0.11 m MV E velocity: 129.00 cm/s  Systemic Diam: 2.10 cm MV A velocity: 46.10 cm/s MV E/A ratio:  2.80 Sunit Tolia Electronically signed by Tessa Lerner Signature Date/Time: 01/07/2023/5:11:42 PM    Final    DG Lumbar Spine Complete  Result Date: 01/07/2023 CLINICAL DATA:  Pain after fall EXAM: LUMBAR SPINE - COMPLETE 5 VIEW COMPARISON:  None Available. FINDINGS: Five lumbar-type vertebral bodies. Preserved vertebral body heights and alignment. No listhesis. Prominent lower lumbar facet degenerative changes. Multilevel disc height loss, most severe at L2-3 but moderate L4-5 and L5-S1. Diffuse scattered endplate osteophytes as well. No spondylolysis. Prominent vascular calcifications. Overlapping cardiac leads. Recommend continue precautions until clinical clearance and if there is further concern of injury additional workup with CT as clinically appropriate for further sensitivity. IMPRESSION: Moderate degenerative changes. Electronically Signed   By: Karen Kays M.D.   On: 01/07/2023 14:19   DG Chest Port 1 View  Result Date: 01/07/2023 CLINICAL DATA:  Pain after fall. EXAM: PORTABLE CHEST 1 VIEW COMPARISON:  X-ray 01/04/2023. FINDINGS: Enlarged cardiopericardial silhouette. Slightly increased left retrocardiac opacity with small effusion. No pneumothorax or edema. No right-sided consolidation. Calcified aorta. Overlapping cardiac leads. IMPRESSION: Slightly increased left retrocardiac opacity and effusion. Recommend continued follow up Electronically Signed   By: Karen Kays M.D.   On: 01/07/2023 14:18   DG Hip Unilat W or Wo Pelvis 2-3 Views Left  Result Date: 01/07/2023 CLINICAL DATA:  Pain after fall EXAM: DG HIP (WITH OR WITHOUT PELVIS) 4V LEFT COMPARISON:  None Available. FINDINGS: No fracture or dislocation. Preserved joint spaces and bone mineralization. Hyperostosis. Diffuse vascular calcifications. IMPRESSION: No acute osseous abnormality. Electronically Signed   By: Karen Kays M.D.   On: 01/07/2023 14:16   CT Head Wo Contrast  Result Date: 01/07/2023 CLINICAL DATA:  Head trauma, minor EXAM: CT HEAD WITHOUT CONTRAST TECHNIQUE: Contiguous axial images were obtained from the base of the skull through the vertex without intravenous contrast. RADIATION DOSE REDUCTION: This exam was performed according to the departmental dose-optimization program which includes automated exposure control, adjustment of the mA and/or kV according to patient size and/or use of iterative reconstruction technique. COMPARISON:  01/04/2023 FINDINGS: Brain: No change since the prior examination. No focal abnormality seen affecting the brainstem or cerebellum. Cerebral hemispheres show old infarctions in the parietooccipital regions, more extensive on the right than the left, with volume loss encephalomalacia. Chronic small-vessel ischemic changes are present otherwise within the cerebral hemispheric white matter. Old basal ganglia infarctions on the left. No sign of acute stroke, mass, hemorrhage, hydrocephalus or extra-axial collection. Vascular: There is atherosclerotic calcification of the major vessels at the base of the brain. Skull: Negative Sinuses/Orbits: Clear/normal Other: None IMPRESSION: No acute or traumatic finding. 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 Chronic small-vessel ischemic changes otherwise within the cerebral hemispheric white matter. Electronically Signed   By: Paulina Fusi M.D.   On: 01/07/2023 13:22   DG Pelvis 1-2 Views  Result Date: 01/04/2023 CLINICAL DATA:  Fall. EXAM: PELVIS - 1-2 VIEW COMPARISON:  None Available. FINDINGS: The cortical margins of the bony pelvis are intact. No fracture. Pubic symphysis and sacroiliac joints are congruent. Both femoral heads are well-seated in the respective acetabula. Mild bilateral hip osteoarthritis. IMPRESSION: 1. No pelvic fracture. 2.  Mild bilateral hip osteoarthritis. Electronically Signed   By: Narda Rutherford M.D.   On: 01/04/2023 16:43   DG Elbow Complete Left  Result Date: 01/04/2023 CLINICAL DATA:  Lost balance leading to fall. EXAM: LEFT ELBOW - COMPLETE 3+ VIEW COMPARISON:  None Available. FINDINGS: No definite acute fracture. Corticated density adjacent to the lateral antecubital fossa has a chronic appearance. No elbow dislocation. Limited assessment for joint effusion due to positioning, no large joint effusion is seen. There are vascular calcifications. No radiopaque foreign body. IMPRESSION: 1. No definite acute fracture of the left elbow. 2. Corticated density adjacent to the lateral antecubital fossa has a chronic appearance. Electronically Signed   By: Narda Rutherford M.D.   On: 01/04/2023 16:42   DG Chest 1 View  Result Date: 01/04/2023 CLINICAL DATA:  Lost balance leading to fall at [place]. EXAM: CHEST  1 VIEW COMPARISON:  Chest radiograph 10/30/2022, CT 12/10/2022 FINDINGS: Stable heart size and mediastinal contours. Patient with known mediastinal adenopathy, not well delineated by radiograph. Left basilar opacity is unchanged from recent CT, combination of pleural effusion and lower lobe collapse. No pneumothorax or acute airspace disease. No displaced rib fracture. IMPRESSION: 1. No acute traumatic injury. 2. Unchanged left basilar opacity, combination of pleural effusion and lower lobe collapse. 3. Known mediastinal adenopathy is not well delineated by radiograph. Electronically Signed   By: Narda Rutherford M.D.   On: 01/04/2023 16:39   CT HEAD WO CONTRAST ( )  Result Date: 01/04/2023 CLINICAL DATA:  Head trauma, minor (Age >= 65y); Polytrauma, blunt EXAM: CT HEAD WITHOUT CONTRAST CT CERVICAL SPINE WITHOUT CONTRAST TECHNIQUE: Multidetector CT imaging of the head and cervical spine was performed following the standard protocol without intravenous contrast. Multiplanar CT image reconstructions of the  cervical spine were also generated. RADIATION DOSE REDUCTION: This exam was performed according to the departmental dose-optimization program which includes automated exposure control, adjustment of the mA and/or kV according to patient size and/or use of iterative reconstruction technique. 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 No evidence of acute hemorrhage, acute large vascular territory infarct, mass lesion or midline shift. No hydrocephalus. Vascular: No hyperdense vessel. Skull: No acute fracture.  Left forehead  contusion. Sinuses/Orbits: Clear sinuses.  No acute abnormalities. CT CERVICAL SPINE FINDINGS Alignment: Reversal of the normal cervical lordosis. Mild anterolisthesis of C7 on T1, likely degenerative. Skull base and vertebrae: No acute fracture. Soft tissues and spinal canal: No prevertebral fluid or swelling. No visible canal hematoma. Disc levels: Large to severe multilevel degenerative change including degenerative disc height loss, endplate spurring and facet/uncovertebral hypertrophy. Upper chest: Layering left pleural effusion. IMPRESSION: 1. No evidence of acute abnormality intracranially or in the cervical spine. 2. Left forehead contusion. 3. Remote infarcts. 4. Layering left pleural effusion. Electronically Signed   By: Feliberto Harts M.D.   On: 01/04/2023 13:49   CT CERVICAL SPINE WO CONTRAST  Result Date: 01/04/2023 CLINICAL DATA:  Head trauma, minor (Age >= 65y); Polytrauma, blunt EXAM: CT HEAD WITHOUT CONTRAST CT CERVICAL SPINE WITHOUT CONTRAST TECHNIQUE: Multidetector CT imaging of the head and cervical spine was performed following the standard protocol without intravenous contrast. Multiplanar CT image reconstructions of the cervical spine were also generated. RADIATION DOSE REDUCTION: This exam was performed  according to the departmental dose-optimization program which includes automated exposure control, adjustment of the mA and/or kV according to patient size and/or use of iterative reconstruction technique. 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 No evidence of acute hemorrhage, acute large vascular territory infarct, mass lesion or midline shift. No hydrocephalus. Vascular: No hyperdense vessel. Skull: No acute fracture.  Left forehead contusion. Sinuses/Orbits: Clear sinuses.  No acute abnormalities. CT CERVICAL SPINE FINDINGS Alignment: Reversal of the normal cervical lordosis. Mild anterolisthesis of C7 on T1, likely degenerative. Skull base and vertebrae: No acute fracture. Soft tissues and spinal canal: No prevertebral fluid or swelling. No visible canal hematoma. Disc levels: Large to severe multilevel degenerative change including degenerative disc height loss, endplate spurring and facet/uncovertebral hypertrophy. Upper chest: Layering left pleural effusion. IMPRESSION: 1. No evidence of acute abnormality intracranially or in the cervical spine. 2. Left forehead contusion. 3. Remote infarcts. 4. Layering left pleural effusion. Electronically Signed   By: Feliberto Harts M.D.   On: 01/04/2023 13:49     Assessment and Plan:   # Chronic systolic and diastolic HF:  # Hypertension:  LVEF this admission 45-50%.  I have reviewed this echo and his echo 10/2021.  I don't see any significant difference in systolic function.  LVEF has been as low as 25-30% and subsequently recovered.  Continue guideline directed medical therapy with carvedilol, Jardiance, losartan, and hydralazine.  Blood pressure is elevated but he has not received any medications yet today.  Will continue to monitor.  He has no heart failure symptoms and is euvolemic.   #  Pericardial effusion:  Trivial pericardial effusion without evidence of tamponade.  There is no evidence of uremia.  Will check TSH.  # PAF:  Maintain K>4, Mg >2.  Continue Eliquis, diltiazem, and carvedilol.  Rates are uncontrolled but he hasn't had any medication yet.  Will monitor after his medication.   # CKD 3b:  Renal function stable.   # Seizure:  Management per primary team   Risk Assessment/Risk Scores:        New York Heart Association (NYHA) Functional Class NYHA Class I  CHA2DS2-VASc Score =     This indicates a  % annual risk of stroke. The patient's score is based upon:  For questions or updates, please contact  [facility] Please  consult [email or web address] for contact info under    Signed, Chilton Si, MD  01/07/2023 8:17 PM

## 2023-01-07 NOTE — Progress Notes (Signed)
Patient refused EEG, stated he just want to be left alone.  I told him I would come back later.  He requested a warm blanket- I provided him with two warm blankets.  Tech will return as schedule allows.  Nurse informed.

## 2023-01-07 NOTE — ED Notes (Signed)
Per daughter pt has left leg pain, weakness and issues of incontinence. They would like to have left leg assessed and assistance with considering rehab.  EDP notified.

## 2023-01-07 NOTE — ED Notes (Signed)
Pt transported to vascular. CCMD notified.

## 2023-01-08 ENCOUNTER — Observation Stay (HOSPITAL_COMMUNITY): Payer: Medicare HMO

## 2023-01-08 DIAGNOSIS — E782 Mixed hyperlipidemia: Secondary | ICD-10-CM | POA: Diagnosis not present

## 2023-01-08 DIAGNOSIS — R569 Unspecified convulsions: Secondary | ICD-10-CM | POA: Diagnosis not present

## 2023-01-08 DIAGNOSIS — I3139 Other pericardial effusion (noninflammatory): Secondary | ICD-10-CM | POA: Diagnosis not present

## 2023-01-08 DIAGNOSIS — I480 Paroxysmal atrial fibrillation: Secondary | ICD-10-CM | POA: Diagnosis not present

## 2023-01-08 DIAGNOSIS — N1832 Chronic kidney disease, stage 3b: Secondary | ICD-10-CM | POA: Diagnosis not present

## 2023-01-08 DIAGNOSIS — I5042 Chronic combined systolic (congestive) and diastolic (congestive) heart failure: Secondary | ICD-10-CM | POA: Diagnosis not present

## 2023-01-08 LAB — CBC
HCT: 27.6 % — ABNORMAL LOW (ref 39.0–52.0)
Hemoglobin: 8.9 g/dL — ABNORMAL LOW (ref 13.0–17.0)
MCH: 27.3 pg (ref 26.0–34.0)
MCHC: 32.2 g/dL (ref 30.0–36.0)
MCV: 84.7 fL (ref 80.0–100.0)
Platelets: 173 10*3/uL (ref 150–400)
RBC: 3.26 MIL/uL — ABNORMAL LOW (ref 4.22–5.81)
RDW: 13.8 % (ref 11.5–15.5)
WBC: 2.1 10*3/uL — ABNORMAL LOW (ref 4.0–10.5)
nRBC: 0 % (ref 0.0–0.2)

## 2023-01-08 LAB — GLUCOSE, CAPILLARY
Glucose-Capillary: 239 mg/dL — ABNORMAL HIGH (ref 70–99)
Glucose-Capillary: 140 mg/dL — ABNORMAL HIGH (ref 70–99)

## 2023-01-08 LAB — BASIC METABOLIC PANEL
Anion gap: 7 (ref 5–15)
BUN: 21 mg/dL (ref 8–23)
CO2: 20 mmol/L — ABNORMAL LOW (ref 22–32)
Calcium: 8 mg/dL — ABNORMAL LOW (ref 8.9–10.3)
Chloride: 105 mmol/L (ref 98–111)
Creatinine, Ser: 1.57 mg/dL — ABNORMAL HIGH (ref 0.61–1.24)
GFR, Estimated: 47 mL/min — ABNORMAL LOW (ref >60)
Glucose, Bld: 129 mg/dL — ABNORMAL HIGH (ref 70–99)
Potassium: 3.4 mmol/L — ABNORMAL LOW (ref 3.5–5.1)
Sodium: 132 mmol/L — ABNORMAL LOW (ref 135–145)

## 2023-01-08 LAB — TSH: TSH: 2.579 u[IU]/mL (ref 0.350–4.500)

## 2023-01-08 MED ORDER — INSULIN ASPART 100 UNIT/ML IJ SOLN: 0.0000 [IU] | Freq: Every day | INTRAMUSCULAR | Status: DC

## 2023-01-08 MED ORDER — METOPROLOL SUCCINATE ER 100 MG PO TB24
200.0000 mg | ORAL_TABLET | Freq: Every day | ORAL | Status: DC
  Administered 2023-01-09 – 2023-01-10 (×2): 200 mg via ORAL
  Filled 2023-01-08 (×2): qty 2

## 2023-01-08 MED ORDER — METOPROLOL TARTRATE 100 MG PO TABS
100.0000 mg | ORAL_TABLET | Freq: Once | ORAL | Status: DC
  Filled 2023-01-08: qty 1

## 2023-01-08 MED ORDER — INSULIN ASPART 100 UNIT/ML IJ SOLN
0.0000 [IU] | Freq: Three times a day (TID) | INTRAMUSCULAR | Status: DC
  Administered 2023-01-08: 3 [IU] via SUBCUTANEOUS
  Administered 2023-01-09 – 2023-01-10 (×2): 1 [IU] via SUBCUTANEOUS

## 2023-01-08 MED ORDER — GABAPENTIN 100 MG PO CAPS
100.0000 mg | ORAL_CAPSULE | Freq: Every day | ORAL | Status: DC
  Administered 2023-01-08 – 2023-01-09 (×2): 100 mg via ORAL
  Filled 2023-01-08 (×2): qty 1

## 2023-01-08 NOTE — Progress Notes (Signed)
EEG complete - results pending 

## 2023-01-08 NOTE — Evaluation (Signed)
Occupational Therapy Evaluation Patient Details Name: Brian [name] MRN: [id number] DOB: 01-Sep-1955 Today's Date: 01/08/2023   History of Present Illness Pt is a 68 y.o. male admitted 11/18 for seizure. Of note, he presented to the ED 11/15 following a fall where he struck his head and was discharged home same day. 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  PTA he lives at home with his elderly mother.  Patient stating independence with ADL,iADL, and mobility.  Uses public transportation for community mobility.  Prior L UE residual weakness for CVA.  Currently needing increased time and effort for ADL and basic transfers.  Mod A for ADL completion and up to Mod A fo sit to stand.  Patient declined up to recliner, eating lunch with min setup.  OT will follow in the acute setting and Patient will benefit from continued inpatient follow up therapy, <3 hours/day.  Mother is unable to provide needed assist at tis time.         If plan is discharge home, recommend the following: A lot of help with bathing/dressing/bathroom;A lot of help with walking and/or transfers;Help with stairs or ramp for entrance;Assist for transportation;Assistance with cooking/housework    Functional Status Assessment  Patient has had a recent decline in their functional status and demonstrates the ability to make significant improvements in function in a reasonable and predictable amount of time.  Equipment Recommendations  None recommended by OT    Recommendations for Other Services       Precautions / Restrictions Precautions Precautions: Fall Restrictions Weight Bearing Restrictions: No      Mobility Bed Mobility Overal bed mobility: Needs Assistance Bed Mobility: Supine to Sit, Sit to Supine     Supine to sit: Mod assist Sit to  supine: Mod assist        Transfers Overall transfer level: Needs assistance Equipment used: Rolling walker (2 wheels) Transfers: Sit to/from Stand Sit to Stand: Min assist, Mod assist                  Balance Overall balance assessment: Needs assistance Sitting-balance support: Feet supported, Bilateral upper extremity supported Sitting balance-Leahy Scale: Fair   Postural control: Posterior lean Standing balance support: Bilateral upper extremity supported Standing balance-Leahy Scale: Poor                             ADL either performed or assessed with clinical judgement   ADL Overall ADL's : Needs assistance/impaired Eating/Feeding: Set up;Bed level   Grooming: Wash/dry hands;Wash/dry face;Contact guard assist;Sitting   Upper Body Bathing: Contact guard assist;Sitting   Lower Body Bathing: Moderate assistance;Sit to/from stand   Upper Body Dressing : Contact guard assist;Sitting   Lower Body Dressing: Moderate assistance;Sit to/from stand   Toilet Transfer: Moderate assistance;Stand-pivot                   Vision Patient Visual Report: No change from baseline       Perception Perception: Not tested       Praxis Praxis: Not tested       Pertinent Vitals/Pain Pain Assessment Faces Pain Scale: Hurts little more Pain Location: L leg Pain Descriptors / Indicators: Guarding, Tender Pain Intervention(s): Monitored during session     Extremity/Trunk Assessment Upper Extremity  Assessment Upper Extremity Assessment: Right hand dominant;LUE deficits/detail LUE Deficits / Details: h/o R CVA with residual L deficits, hand resting in flexed position LUE Sensation: decreased light touch LUE Coordination: decreased fine motor;decreased gross motor   Lower Extremity Assessment Lower Extremity Assessment: Defer to PT evaluation   Cervical / Trunk Assessment Cervical / Trunk Assessment: Kyphotic   Communication  Communication Communication: No apparent difficulties   Cognition Arousal: Alert Behavior During Therapy: WFL for tasks assessed/performed Overall Cognitive Status: Within Functional Limits for tasks assessed                                       General Comments   VSS on RA    Exercises     Shoulder Instructions      Home Living Family/patient expects to be discharged to:: Private residence Living Arrangements: Parent Available Help at Discharge: Family;Available PRN/intermittently Type of Home: House Home Access: Stairs to enter [employer] of Steps: 3 Entrance Stairs-Rails: Left Home Layout: One level     Bathroom Shower/Tub: Producer, television/film/video: Standard Bathroom Accessibility: Yes How Accessible: Accessible via walker Home Equipment: Cane - single point          Prior Functioning/Environment Prior Level of Function : Independent/Modified Independent             Mobility Comments: amb with/without cane ADLs Comments: no assist at baseline with ADL,iADL meal prep or medication management.        OT Problem List: Decreased strength;Decreased range of motion;Decreased activity tolerance;Impaired balance (sitting and/or standing);Pain;Impaired UE functional use;Decreased safety awareness      OT Treatment/Interventions: Self-care/ADL training;Therapeutic activities;Patient/family education;Balance training    OT Goals(Current goals can be found in the care plan section) Acute Rehab OT Goals Patient Stated Goal: Return home OT Goal Formulation: With patient Time For Goal Achievement: 01/22/23 Potential to Achieve Goals: Good ADL Goals Pt Will Perform Grooming: with set-up;sitting;standing Pt Will Perform Upper Body Dressing: with set-up;sitting Pt Will Perform Lower Body Dressing: with contact guard assist;sit to/from stand Pt Will Transfer to Toilet: with supervision;ambulating;regular height toilet  OT  Frequency: Min 1X/week    Co-evaluation              AM-PAC OT "6 Clicks" Daily Activity     Outcome Measure Help from another person eating meals?: A Little Help from another person taking care of personal grooming?: A Little Help from another person toileting, which includes using toliet, bedpan, or urinal?: A Lot Help from another person bathing (including washing, rinsing, drying)?: A Lot Help from another person to put on and taking off regular upper body clothing?: A Little Help from another person to put on and taking off regular lower body clothing?: A Lot 6 Click Score: 15   End of Session Nurse Communication: Mobility status  Activity Tolerance: Patient tolerated treatment well Patient left: in bed;with call bell/phone within reach;with bed alarm set  OT Visit Diagnosis: Unsteadiness on feet (R26.81);Muscle weakness (generalized) (M62.81);Pain                Time: 5284-1324 OT Time Calculation (min): 19 min Charges:  OT General Charges $OT Visit: 1 Visit OT Evaluation $OT Eval Moderate Complexity: 1 Mod  01/08/2023  RP, OTR/L  Acute Rehabilitation Services  Office:  [phone]   Suzanna Obey 01/08/2023, 2:28 PM

## 2023-01-08 NOTE — ED Notes (Signed)
ED TO INPATIENT HANDOFF REPORT  ED Nurse Name and Phone #: Victorino Dike [phone]  S Name/Age/Gender Brian [name] 67 y.o. male Room/Bed: 037C/037C  Code Status   Code Status: Full Code  Home/SNF/Other Skilled nursing facility Patient oriented to: self, place, time, and situation Is this baseline? Yes   Triage Complete: Triage complete  Chief Complaint Pericardial effusion [I31.39] Seizure (HCC) [R56.9]  Triage Note Pt BIB [facility] from home for seizure like activity. Pt took morning meds and was about to have breakfast in bed when he suddenly stiffened up and fell back. Episode lasted approx. 30 sec.  Pt is in afib/RVR with hx of same. PT had fall on Friday for which he was evaluated.  Pt takes Eliquis and had all morning meds.  Pt has left side weakness r/t prev. Stroke.   Initial BP 90/62, 110/80 after 400 mL fluids, 97% , HR 90-160, CBG 148     Allergies Allergies  Allergen Reactions   Codeine Other (See Comments)    Unknown reaction, severe headach   Metformin And Related Diarrhea    Level of Care/Admitting Diagnosis ED Disposition     ED Disposition  Admit   Condition  --   Comment  Hospital Area: [facility] [[id number]]  Level of Care: Med-Surg [16]  May place patient in observation at [facility] or [facility] if equivalent level of care is available:: Yes  Covid Evaluation: Asymptomatic - no recent exposure (last 10 days) testing not required  Diagnosis: Seizure [facility]) [[id number]]  Admitting Physician: Dorcas Carrow [[id number]]  Attending Physician: Dorcas Carrow [[id number]]          B Medical/Surgery History Past Medical History:  Diagnosis Date   Arthritis    Atrial fibrillation (HCC)    Cancer (HCC)    SCC base of tongue 2021   Cardiomyopathy    Cerebrovascular accident (HCC)    2012   Diabetes mellitus    Hyperlipidemia    Hypertension    Left-sided weakness    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 [name], laser  therapy OD   Seizures (HCC)    pt reports this is from low blood sugar   Past Surgical History:  Procedure Laterality Date   BRONCHIAL NEEDLE ASPIRATION BIOPSY  10/30/2022   Procedure: BRONCHIAL NEEDLE ASPIRATION BIOPSIES;  Surgeon: Josephine Igo, DO;  Location: [facility] ENDOSCOPY;  Service: Cardiopulmonary;;   COLONOSCOPY     DIRECT LARYNGOSCOPY N/A 02/10/2020   Procedure: DIRECT LARYNGOSCOPY WITH BIOPSY;  Surgeon: Newman Pies, MD;  Location: [facility] OR;  Service: ENT;  Laterality: N/A;   Ear surgery as a child     IR GASTROSTOMY TUBE MOD SED  03/23/2020   IR GASTROSTOMY TUBE REMOVAL  06/01/2020   IR IMAGING GUIDED PORT INSERTION  03/09/2020   IR REMOVAL TUN ACCESS W/ PORT W/O FL MOD SED  02/08/2021   THORACENTESIS  10/30/2022   Procedure: Alanson Puls;  Surgeon: Josephine Igo, DO;  Location: [facility] ENDOSCOPY;  Service: Cardiopulmonary;;  L pleural effusion   TONSILLECTOMY     VIDEO BRONCHOSCOPY Left 10/30/2022   Procedure: VIDEO BRONCHOSCOPY WITHOUT FLUORO;  Surgeon: Josephine Igo, DO;  Location: [facility] ENDOSCOPY;  Service: Cardiopulmonary;  Laterality: Left;   VIDEO BRONCHOSCOPY WITH ENDOBRONCHIAL ULTRASOUND Bilateral 10/30/2022   Procedure: VIDEO BRONCHOSCOPY WITH ENDOBRONCHIAL ULTRASOUND;  Surgeon: Josephine Igo, DO;  Location: [facility] ENDOS[facility];  Service: Cardiopulmonary;  Laterality: Bilateral;     A IV Location/Drains/Wounds Patient Lines/Drains/Airways Status  Active Line/Drains/Airways     Name Placement date Placement time Site Days   Peripheral IV 01/04/23 22 G Left;Posterior Hand 01/04/23  --  Hand  4   Wound / Incision (Open or Dehisced) 11/03/21 Skin tear Elbow Anterior;Left 11/03/21  1541  Elbow  431   Wound / Incision (Open or Dehisced) 11/03/21 Skin tear Elbow Anterior;Right 11/03/21  1542  Elbow  431            Intake/Output Last 24 hours  Intake/Output Summary (Last 24 hours) at 01/08/2023 1124 Last data filed at 01/08/2023 0159 Gross per 24 hour  Intake 1000 ml   Output 500 ml  Net 500 ml    Labs/Imaging Results for orders placed or performed during the hospital encounter of 01/07/23 (from the past 48 hour(s))  CBG monitoring, ED     Status: Abnormal   Collection Time: 01/07/23 11:46 AM  Result Value Ref Range   Glucose-Capillary 106 (H) 70 - 99 mg/dL    Comment: Glucose reference range applies only to samples taken after fasting for at least 8 hours.  CBC with Differential     Status: Abnormal   Collection Time: 01/07/23 11:49 AM  Result Value Ref Range   WBC 2.2 (L) 4.0 - 10.5 K/uL   RBC 3.28 (L) 4.22 - 5.81 MIL/uL   Hemoglobin 9.0 (L) 13.0 - 17.0 g/dL   HCT 45.4 (L) 09.8 - 11.9 %   MCV 85.4 80.0 - 100.0 fL   MCH 27.4 26.0 - 34.0 pg   MCHC 32.1 30.0 - 36.0 g/dL   RDW 14.7 82.9 - 56.2 %   Platelets 184 150 - 400 K/uL   nRBC 0.0 0.0 - 0.2 %   Neutrophils Relative % 79 %   Neutro Abs 1.8 1.7 - 7.7 K/uL   Lymphocytes Relative 6 %   Lymphs Abs 0.1 (L) 0.7 - 4.0 K/uL   Monocytes Relative 12 %   Monocytes Absolute 0.3 0.1 - 1.0 K/uL   Eosinophils Relative 1 %   Eosinophils Absolute 0.0 0.0 - 0.5 K/uL   Basophils Relative 1 %   Basophils Absolute 0.0 0.0 - 0.1 K/uL   Immature Granulocytes 1 %   Abs Immature Granulocytes 0.01 0.00 - 0.07 K/uL    Comment: Performed at [facility], [address], [place], [place] [address]  Comprehensive metabolic panel     Status: Abnormal   Collection Time: 01/07/23 11:49 AM  Result Value Ref Range   Sodium 134 (L) 135 - 145 mmol/L   Potassium 3.2 (L) 3.5 - 5.1 mmol/L   Chloride 104 98 - 111 mmol/L   CO2 19 (L) 22 - 32 mmol/L   Glucose, Bld 117 (H) 70 - 99 mg/dL    Comment: Glucose reference range applies only to samples taken after fasting for at least 8 hours.   BUN 25 (H) 8 - 23 mg/dL   Creatinine, Ser 5.78 (H) 0.61 - 1.24 mg/dL   Calcium 8.0 (L) 8.9 - 10.3 mg/dL   Total Protein 5.4 (L) 6.5 - 8.1 g/dL   Albumin 2.5 (L) 3.5 - 5.0 g/dL   AST 11 (L) 15 - 41 U/L   ALT 10 0 - 44 U/L    Alkaline Phosphatase 40 38 - 126 U/L   Total Bilirubin 0.6 <1.2 mg/dL   GFR, Estimated 42 (L) >60 mL/min    Comment: (NOTE) Calculated using the CKD-EPI Creatinine Equation (2021)    Anion gap 11 5 - 15  Comment: Performed at [facility], [address] [place], [place] [address]  HIV Antibody (routine testing w rflx)     Status: None   Collection Time: 01/07/23  4:00 PM  Result Value Ref Range   HIV Screen 4th Generation wRfx Non Reactive Non Reactive    Comment: Performed at [facility], [address] [place], [place] [address]  CBC     Status: Abnormal   Collection Time: 01/08/23  5:19 AM  Result Value Ref Range   WBC 2.1 (L) 4.0 - 10.5 K/uL   RBC 3.26 (L) 4.22 - 5.81 MIL/uL   Hemoglobin 8.9 (L) 13.0 - 17.0 g/dL   HCT 69.6 (L) 29.5 - 28.4 %   MCV 84.7 80.0 - 100.0 fL   MCH 27.3 26.0 - 34.0 pg   MCHC 32.2 30.0 - 36.0 g/dL   RDW 13.2 44.0 - 10.2 %   Platelets 173 150 - 400 K/uL   nRBC 0.0 0.0 - 0.2 %    Comment: Performed at [facility], [address] [place], [place] [address]  Basic metabolic panel     Status: Abnormal   Collection Time: 01/08/23  5:19 AM  Result Value Ref Range   Sodium 132 (L) 135 - 145 mmol/L   Potassium 3.4 (L) 3.5 - 5.1 mmol/L   Chloride 105 98 - 111 mmol/L   CO2 20 (L) 22 - 32 mmol/L   Glucose, Bld 129 (H) 70 - 99 mg/dL    Comment: Glucose reference range applies only to samples taken after fasting for at least 8 hours.   BUN 21 8 - 23 mg/dL   Creatinine, Ser 6.44 (H) 0.61 - 1.24 mg/dL   Calcium 8.0 (L) 8.9 - 10.3 mg/dL   GFR, Estimated 47 (L) >60 mL/min    Comment: (NOTE) Calculated using the CKD-EPI Creatinine Equation (2021)    Anion gap 7 5 - 15    Comment: Performed at [facility], [address] [place], [place] [address]  TSH     Status: None   Collection Time: 01/08/23  5:19 AM  Result Value Ref Range   TSH 2.579 0.350 - 4.500 uIU/mL    Comment: Performed by a 3rd Generation assay with a functional  sensitivity of <=0.01 uIU/mL. Performed at [facility], [address], [place], [place] [address]    *Note: Due to a large number of results and/or encounters for the requested time period, some results have not been displayed. A complete set of results can be found in Results Review.   EEG adult  Result Date: 01/08/2023 Charlsie Quest, MD     01/08/2023  8:29 AM Patient Name: Brian [name] MRN: [id number] Epilepsy Attending: Charlsie Quest Referring Physician/Provider: Clydie Braun, MD Date: 01/08/2023 Duration: 22.22 mins Patient history: 67 yo M presents after having a reported seizure witnessed by his daughter who states that he stiffened up prior to falling back on the bed with generalized tonic-clonic activity. EEG to evaluate for seizure Level of alertness: Awake AEDs during EEG study: LEV Technical aspects: This EEG study was done with scalp electrodes positioned according to the 10-20 International system of electrode placement. Electrical activity was reviewed with band pass filter of 1-70Hz , sensitivity of 7 uV/mm, display speed of 81mm/sec with a 60Hz  notched filter applied as appropriate. EEG data were recorded continuously and digitally stored.  Video monitoring was available and reviewed as appropriate. Description: The posterior  dominant rhythm consists of 8 Hz activity of moderate voltage (25-35 uV) seen predominantly in posterior head regions, symmetric and reactive to eye opening and eye closing. Hyperventilation and photic stimulation were not performed.   IMPRESSION: This study is within normal limits. No seizures or epileptiform discharges were seen throughout the recording. A normal interictal EEG does not exclude the diagnosis of epilepsy. Charlsie Quest   MR BRAIN WO CONTRAST  Result Date: 01/07/2023 CLINICAL DATA:  Seizure, new onset, no history of trauma EXAM: MRI HEAD WITHOUT CONTRAST TECHNIQUE: Multiplanar, multiecho pulse sequences of the brain and  surrounding structures were obtained without intravenous contrast. COMPARISON:  01/17/2013 MRI head, correlation is made with 01/07/2023 CT head FINDINGS: Brain: The hippocampi are roughly symmetric in size and signal. No heterotopia or evidence of cortical dysgenesis. No restricted diffusion to suggest acute or subacute infarct. No acute hemorrhage, mass, mass effect, or midline shift. No hydrocephalus or extra-axial collection. Partial empty sella. Craniocervical junction within normal limits. Hemosiderin deposition is associated with encephalomalacia from a remote right MCA territory infarct, likely petechial hemorrhage. Additional punctate foci of hemosiderin deposition in the posterior right temporal lobe, likely sequela of prior hypertensive microhemorrhages, and left basal ganglia, likely related to remote infarct. Additional encephalomalacia in the left parieto-occipital region, with additional lacunar infarct in the left thalamus. Confluent T2 hyperintense signal in the periventricular white matter, likely the sequela of severe chronic small vessel ischemic disease. Vascular: Loss of the right vertebral artery flow void, which appears unchanged from the 2014 MRI. Otherwise normal arterial flow voids. Skull and upper cervical spine: Normal marrow signal. Sinuses/Orbits: No mucosal thickening in the left maxillary sinus. No acute finding in the orbits. Other: Fluid in the right mastoid and middle ear. IMPRESSION: 1. No acute intracranial process. No seizure focus is identified. No evidence of acute or subacute infarct 2. Loss of the right vertebral artery flow void, which appears unchanged from the 2014 MRI, and is favored to be chronic. 3. Fluid in the right mastoid and middle ear. Correlate for otomastoiditis. Electronically Signed   By: Wiliam Ke M.D.   On: 01/07/2023 21:10   ECHOCARDIOGRAM COMPLETE  Result Date: 01/07/2023    ECHOCARDIOGRAM REPORT   Patient Name:   Brian [name] Date of Exam:  01/07/2023 Medical Rec #:  [id number]         Height:       69.0 in Accession #:    [id number]        Weight:       162.7 lb Date of Birth:  [birth year]          BSA:          1.892 m Patient Age:    67 years          BP:           125/92 mmHg Patient Gender: M                 HR:           108 bpm. Exam Location:  Inpatient Procedure: 2D Echo, Color Doppler and Cardiac Doppler Indications:    Pericardial Effusion  History:        Patient has prior history of Echocardiogram examinations, most                 recent 11/08/2021. CHF, Pericardial Disease, Stroke and Lung                 Cancer,  Arrythmias:Atrial Fibrillation; Risk Factors:Diabetes                 and Dyslipidemia.  Sonographer:    Milbert Coulter Referring Phys: [id number] DAYNA N DUNN IMPRESSIONS  1. Left ventricular ejection fraction, by estimation, is 45 to 50%. The left ventricle has mildly decreased function. The left ventricle has no regional wall motion abnormalities. There is moderate left ventricular hypertrophy. Left ventricular diastolic parameters are consistent with Grade II diastolic dysfunction (pseudonormalization). Elevated left atrial pressure. The E/e' is 21.  2. Right ventricular systolic function is mildly reduced. The right ventricular size is normal.  3. Left atrial size was moderately dilated.  4. Right atrial size was dilated.  5. The mitral valve is normal in structure. Trivial mitral valve regurgitation. No evidence of mitral stenosis.  6. The aortic valve is tricuspid. Aortic valve regurgitation is not visualized. Aortic valve sclerosis is present, with no evidence of aortic valve stenosis.  7. Aortic dilatation noted. There is dilatation of the ascending aorta, measuring 40 mm. Comparison(s): A prior study was performed on 11/08/2021. LVEF previously noted to be 55-60% and now 45-50%. See report for details. FINDINGS  Left Ventricle: Left ventricular ejection fraction, by estimation, is 45 to 50%. The left ventricle has mildly decreased  function. The left ventricle has no regional wall motion abnormalities. The left ventricular internal cavity size was normal in size. There is moderate left ventricular hypertrophy. Left ventricular diastolic parameters are consistent with Grade II diastolic dysfunction (pseudonormalization). Elevated left atrial pressure. The E/e' is 21. Right Ventricle: The right ventricular size is normal. No increase in right ventricular wall thickness. Right ventricular systolic function is mildly reduced. Left Atrium: Left atrial size was moderately dilated. Right Atrium: Right atrial size was dilated. Pericardium: Trivial pericardial effusion is present. Mitral Valve: The mitral valve is normal in structure. Trivial mitral valve regurgitation. No evidence of mitral valve stenosis. Tricuspid Valve: The tricuspid valve is normal in structure. Tricuspid valve regurgitation is trivial. No evidence of tricuspid stenosis. Aortic Valve: The aortic valve is tricuspid. Aortic valve regurgitation is not visualized. Aortic valve sclerosis is present, with no evidence of aortic valve stenosis. Aortic valve mean gradient measures 2.0 mmHg. Aortic valve peak gradient measures 3.7  mmHg. Aortic valve area, by VTI measures 2.32 cm. Pulmonic Valve: The pulmonic valve was not well visualized. Pulmonic valve regurgitation is not visualized. No evidence of pulmonic stenosis. Aorta: Aortic dilatation noted and the aortic root is normal in size and structure. There is dilatation of the ascending aorta, measuring 40 mm. IAS/Shunts: The atrial septum is grossly normal. Additional Comments: There is pleural effusion in the left lateral region.  LEFT VENTRICLE PLAX 2D LVIDd:         3.50 cm   Diastology LVIDs:         2.70 cm   LV e' medial:    5.22 cm/s LV PW:         1.40 cm   LV E/e' medial:  24.7 LV IVS:        1.40 cm   LV e' lateral:   7.72 cm/s LVOT diam:     2.10 cm   LV E/e' lateral: 16.7 LV SV:         38 LV SV Index:   20 LVOT Area:      3.46 cm  RIGHT VENTRICLE RV Basal diam:  3.40 cm RV Mid diam:    2.60 cm RV S prime:  6.74 cm/s TAPSE (M-mode): 1.2 cm LEFT ATRIUM             Index        RIGHT ATRIUM           Index LA diam:        4.10 cm 2.17 cm/m   RA Area:     21.70 cm LA Vol (A2C):   80.0 ml 42.27 ml/m  RA Volume:   60.30 ml  31.86 ml/m LA Vol (A4C):   81.3 ml 42.96 ml/m LA Biplane Vol: 82.3 ml 43.49 ml/m  AORTIC VALVE AV Area (Vmax):    2.38 cm AV Area (Vmean):   2.37 cm AV Area (VTI):     2.32 cm AV Vmax:           96.20 cm/s AV Vmean:          64.200 cm/s AV VTI:            0.166 m AV Peak Grad:      3.7 mmHg AV Mean Grad:      2.0 mmHg LVOT Vmax:         66.00 cm/s LVOT Vmean:        44.000 cm/s LVOT VTI:          0.111 m LVOT/AV VTI ratio: 0.67  AORTA Ao Root diam: 3.10 cm Ao Asc diam:  4.00 cm MITRAL VALVE MV Area (PHT): 4.29 cm     SHUNTS MV Decel Time: 177 msec     Systemic VTI:  0.11 m MV E velocity: 129.00 cm/s  Systemic Diam: 2.10 cm MV A velocity: 46.10 cm/s MV E/A ratio:  2.80 Sunit Tolia Electronically signed by Tessa Lerner Signature Date/Time: 01/07/2023/5:11:42 PM    Final    DG Lumbar Spine Complete  Result Date: 01/07/2023 CLINICAL DATA:  Pain after fall EXAM: LUMBAR SPINE - COMPLETE 5 VIEW COMPARISON:  None Available. FINDINGS: Five lumbar-type vertebral bodies. Preserved vertebral body heights and alignment. No listhesis. Prominent lower lumbar facet degenerative changes. Multilevel disc height loss, most severe at L2-3 but moderate L4-5 and L5-S1. Diffuse scattered endplate osteophytes as well. No spondylolysis. Prominent vascular calcifications. Overlapping cardiac leads. Recommend continue precautions until clinical clearance and if there is further concern of injury additional workup with CT as clinically appropriate for further sensitivity. IMPRESSION: Moderate degenerative changes. Electronically Signed   By: Karen Kays M.D.   On: 01/07/2023 14:19   DG Chest Port 1 View  Result Date:  01/07/2023 CLINICAL DATA:  Pain after fall. EXAM: PORTABLE CHEST 1 VIEW COMPARISON:  X-ray 01/04/2023. FINDINGS: Enlarged cardiopericardial silhouette. Slightly increased left retrocardiac opacity with small effusion. No pneumothorax or edema. No right-sided consolidation. Calcified aorta. Overlapping cardiac leads. IMPRESSION: Slightly increased left retrocardiac opacity and effusion. Recommend continued follow up Electronically Signed   By: Karen Kays M.D.   On: 01/07/2023 14:18   DG Hip Unilat W or Wo Pelvis 2-3 Views Left  Result Date: 01/07/2023 CLINICAL DATA:  Pain after fall EXAM: DG HIP (WITH OR WITHOUT PELVIS) 4V LEFT COMPARISON:  None Available. FINDINGS: No fracture or dislocation. Preserved joint spaces and bone mineralization. Hyperostosis. Diffuse vascular calcifications. IMPRESSION: No acute osseous abnormality. Electronically Signed   By: Karen Kays M.D.   On: 01/07/2023 14:16   CT Head Wo Contrast  Result Date: 01/07/2023 CLINICAL DATA:  Head trauma, minor EXAM: CT HEAD WITHOUT CONTRAST TECHNIQUE: Contiguous axial images were obtained from the base of the skull through the vertex  without intravenous contrast. RADIATION DOSE REDUCTION: This exam was performed according to the departmental dose-optimization program which includes automated exposure control, adjustment of the mA and/or kV according to patient size and/or use of iterative reconstruction technique. COMPARISON:  01/04/2023 FINDINGS: Brain: No change since the prior examination. No focal abnormality seen affecting the brainstem or cerebellum. Cerebral hemispheres show old infarctions in the parietooccipital regions, more extensive on the right than the left, with volume loss encephalomalacia. Chronic small-vessel ischemic changes are present otherwise within the cerebral hemispheric white matter. Old basal ganglia infarctions on the left. No sign of acute stroke, mass, hemorrhage, hydrocephalus or extra-axial collection.  Vascular: There is atherosclerotic calcification of the major vessels at the base of the brain. Skull: Negative Sinuses/Orbits: Clear/normal Other: None IMPRESSION: No acute or traumatic finding. 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 Chronic small-vessel ischemic changes otherwise within the cerebral hemispheric white matter. Electronically Signed   By: Paulina Fusi M.D.   On: 01/07/2023 13:22    Pending Labs Unresulted Labs (From admission, onward)    None       Vitals/Pain Today's Vitals   01/08/23 0800 01/08/23 0900 01/08/23 0910 01/08/23 0931  BP: (!) 125/91 (!) 122/99 122/89   Pulse: 61 63 (!) 120   Resp: (!) 23 (!) 26 (!) 25   Temp:    97.7 F (36.5 C)  TempSrc:      SpO2: 100% 100% 99%   Weight:      Height:      PainSc:        Isolation Precautions No active isolations  Medications Medications  acetaminophen (TYLENOL) tablet 650 mg (has no administration in time range)    Or  acetaminophen (TYLENOL) suppository 650 mg (has no administration in time range)  albuterol (PROVENTIL) (2.5 MG/3ML) 0.083% nebulizer solution 2.5 mg (has no administration in time range)  levETIRAcetam (KEPPRA) tablet 500 mg (500 mg Oral Given 01/08/23 0926)  LORazepam (ATIVAN) injection 1-2 mg (has no administration in time range)  atorvastatin (LIPITOR) tablet 20 mg (20 mg Oral Given 01/08/23 0926)  diltiazem (CARDIZEM CD) 24 hr capsule 120 mg (120 mg Oral Given 01/08/23 0926)  hydrALAZINE (APRESOLINE) tablet 50 mg (50 mg Oral Given 01/08/23 0926)  empagliflozin (JARDIANCE) tablet 25 mg (25 mg Oral Given 01/08/23 0927)  apixaban (ELIQUIS) tablet 5 mg (5 mg Oral Given 01/08/23 0927)  losartan (COZAAR) tablet 100 mg (100 mg Oral Given 01/08/23 0926)  insulin aspart (novoLOG) injection 0-6 Units (has no administration in time range)  insulin aspart (novoLOG) injection 0-5 Units (has no administration in  time range)  metoprolol succinate (TOPROL-XL) 24 hr tablet 200 mg (has no administration in time range)  metoprolol tartrate (LOPRESSOR) tablet 100 mg (has no administration in time range)  sodium chloride 0.9 % bolus 1,000 mL (0 mLs Intravenous Stopped 01/07/23 1436)  levETIRAcetam (KEPPRA) IVPB 1000 mg/100 mL premix (0 mg Intravenous Stopped 01/07/23 1714)  potassium chloride SA (KLOR-CON M) CR tablet 40 mEq (40 mEq Oral Given 01/07/23 2025)    Mobility walks with device     Focused Assessments    R Recommendations: See Admitting Provider Note  Report given to:   Additional Notes:

## 2023-01-08 NOTE — Care Management Obs Status (Signed)
MEDICARE OBSERVATION STATUS NOTIFICATION   Patient Details  Name: MITUL KUEFLER MRN: [id number] Date of Birth: 04/15/1955   Medicare Observation Status Notification Given:  Yes    Oletta Cohn, RN 01/08/2023, 9:11 AM

## 2023-01-08 NOTE — Progress Notes (Signed)
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  He lives at home.  He also has 65 year old mother who he helps at home.  Complained of poor mobility, poor balance and falling about 3 days ago.  Apparently, patient was gotten up to sit on the side of the bed to eat his breakfast, his daughter was there who noticed him becoming very stiff, shaking for almost 45 minutes.  No incontinence.  Took about 2 minutes to come back to himself.  Patient was neurologically stable by the time he came to the ER.  He does have history of prior seizure and taken off Keppra in 2016 after he stopped having any seizures.  In the emergency room, afebrile.  On room air.  WBC 2.2.  Creatinine 1.73.  CT scan of the head without any acute abnormality.  old strokes.  Chest x-ray showed cardiomegaly, and urgent echocardiogram was without any evidence of significant pericardial effusion.  Admitted with neurology and cardiology recommendations.  Subjective: Patient seen in the morning rounds.  Denies any complaints.  Patient tells me that he needs some rehab.  He has very hard time walking.  Assessment & Plan:   Seizure disorder in a patient with known previous seizure: No focal neurological deficits.  Initial CT head without any acute abnormality.  Currently improving. Seizure precautions.  Fall precautions.  PT OT. As per neurology recommendation, he was loaded with Keppra 1 g IV then continued on Keppra 500 mg twice daily. Seizure recommendations, driving restrictions and instructions given.  Suspected pericardial  effusion, chronic combined heart failure: Patient is fairly euvolemic today.  Echocardiogram showed ejection fraction 45 to 50%.  Apparently recovered from previous ejection fraction.  Currently on guideline directed medical therapy with Jardiance, losartan and hydralazine.  Also changed carvedilol to metoprolol.  Seen by cardiology. No evidence of cardiac tamponade.  TSH normal.  Paroxysmal A-fib: On Eliquis and Cardizem.  A-fib but rate controlled.  Chronic medical issues including CKD stage IIIb, at about baseline. Hypokalemia, replaced.  Adequate. Essential hypertension, as above.  Blood pressure stable. Type 2 diabetes on Jardiance.  Known A1c 6.3.  Continue Jardiance.  Will check blood sugars. Pancytopenia, chronic pancytopenia. History of stroke with left hemiparesis, stable.  On statin and Eliquis. Squamous cell carcinoma of the base of the tongue, squamous cell lung cancer.  Stable.  Oncology follow-up.  Debility and disability: Profound debility.  Work with PT OT.  Refer to SNF.  DVT prophylaxis:  apixaban (ELIQUIS) tablet 5 mg   Code Status: Full code Family Communication: None at the bedside Disposition Plan: Status is: Observation The patient will require care spanning > 2 midnights and should be moved to inpatient because: Profound debility.  Unsafe discharge planning.     Consultants:  Cardiology Neurology, curbside  Procedures:  None  Antimicrobials:  None     Objective: Vitals:   01/08/23 0900 01/08/23 0910 01/08/23 0931 01/08/23 1200  BP: (!) 122/99 122/89  (!) 124/96  Pulse: 63 (!) 120  (!) 105  Resp: (!) 26 (!) 25  (!) 21  Temp:   97.7 F (36.5 C)  TempSrc:      SpO2: 100% 99%  95%  Weight:      Height:        Intake/Output Summary (Last 24 hours) at Jan 19, 2023 1308 Last data filed at 01/19/2023 0159 Gross per 24 hour  Intake 1000 ml  Output 500 ml  Net 500 ml   Filed Weights   01/07/23 1134  Weight: 73.8 kg     Examination:  General exam: Appears calm and comfortable  Pleasant and interactive.  He is debilitated.  Older than his stated age. Respiratory system: Clear to auscultation. Respiratory effort normal. Cardiovascular system: S1 & S2 heard, irregularly irregular.  No pedal edema. Gastrointestinal system: Abdomen is nondistended, soft and nontender. No organomegaly or masses felt. Normal bowel sounds heard. Central nervous system: Alert and oriented. Patient does have generalized weakness mostly on the left side which is chronic.  Data Reviewed: I have personally reviewed following labs and imaging studies  CBC: Recent Labs  Lab 01/04/23 1242 01/04/23 1413 01/07/23 1149 2023-01-19 0519  WBC  --  3.1* 2.2* 2.1*  NEUTROABS  --  2.6 1.8  --   HGB 9.9* 10.6* 9.0* 8.9*  HCT 29.0* 32.7* 28.0* 27.6*  MCV  --  85.8 85.4 84.7  PLT  --  205 184 173   Basic Metabolic Panel: Recent Labs  Lab 01/04/23 1242 01/04/23 1246 01/07/23 1149 Jan 19, 2023 0519  NA 135 134* 134* 132*  K 3.4* 3.3* 3.2* 3.4*  CL 101 101 104 105  CO2  --  21* 19* 20*  GLUCOSE 211* 209* 117* 129*  BUN 21 20 25* 21  CREATININE 1.70* 1.62* 1.73* 1.57*  CALCIUM  --  8.6* 8.0* 8.0*   GFR: Estimated Creatinine Clearance: 44.4 mL/min (A) (by C-G formula based on SCr of 1.57 mg/dL (H)). Liver Function Tests: Recent Labs  Lab 01/04/23 1246 01/07/23 1149  AST 12* 11*  ALT 12 10  ALKPHOS 51 40  BILITOT 0.7 0.6  PROT 6.2* 5.4*  ALBUMIN 2.9* 2.5*   No results for input(s): "LIPASE", "AMYLASE" in the last 168 hours. No results for input(s): "AMMONIA" in the last 168 hours. Coagulation Profile: No results for input(s): "INR", "PROTIME" in the last 168 hours. Cardiac Enzymes: No results for input(s): "CKTOTAL", "CKMB", "CKMBINDEX", "TROPONINI" in the last 168 hours. BNP (last 3 results) No results for input(s): "PROBNP" in the last 8760 hours. HbA1C: No results for input(s): "HGBA1C" in the last 72  hours. CBG: Recent Labs  Lab 01/07/23 1146  GLUCAP 106*   Lipid Profile: No results for input(s): "CHOL", "HDL", "LDLCALC", "TRIG", "CHOLHDL", "LDLDIRECT" in the last 72 hours. Thyroid Function Tests: Recent Labs    01/19/2023 0519  TSH 2.579   Anemia Panel: No results for input(s): "VITAMINB12", "FOLATE", "FERRITIN", "TIBC", "IRON", "RETICCTPCT" in the last 72 hours. Sepsis Labs: No results for input(s): "PROCALCITON", "LATICACIDVEN" in the last 168 hours.  No results found for this or any previous visit (from the past 240 hour(s)).       Radiology Studies: EEG adult  Result Date: 19-Jan-2023 Charlsie Quest, MD     01-19-2023  8:29 AM Patient Name: DEKOTA WASLEY MRN: [id number] Epilepsy Attending: Charlsie Quest Referring Physician/Provider: Clydie Braun, MD Date: Jan 19, 2023 Duration: 22.22 mins Patient history: 67 yo M presents after having a reported seizure witnessed by his daughter who states that he stiffened up prior to falling back on the bed with generalized tonic-clonic activity. EEG to evaluate for seizure Level of alertness:  Awake AEDs during EEG study: LEV Technical aspects: This EEG study was done with scalp electrodes positioned according to the 10-20 International system of electrode placement. Electrical activity was reviewed with band pass filter of 1-70Hz , sensitivity of 7 uV/mm, display speed of 75mm/sec with a 60Hz  notched filter applied as appropriate. EEG data were recorded continuously and digitally stored.  Video monitoring was available and reviewed as appropriate. Description: The posterior dominant rhythm consists of 8 Hz activity of moderate voltage (25-35 uV) seen predominantly in posterior head regions, symmetric and reactive to eye opening and eye closing. Hyperventilation and photic stimulation were not performed.   IMPRESSION: This study is within normal limits. No seizures or epileptiform discharges were seen throughout the recording. A normal  interictal EEG does not exclude the diagnosis of epilepsy. Charlsie Quest   MR BRAIN WO CONTRAST  Result Date: 01/07/2023 CLINICAL DATA:  Seizure, new onset, no history of trauma EXAM: MRI HEAD WITHOUT CONTRAST TECHNIQUE: Multiplanar, multiecho pulse sequences of the brain and surrounding structures were obtained without intravenous contrast. COMPARISON:  01/17/2013 MRI head, correlation is made with 01/07/2023 CT head FINDINGS: Brain: The hippocampi are roughly symmetric in size and signal. No heterotopia or evidence of cortical dysgenesis. No restricted diffusion to suggest acute or subacute infarct. No acute hemorrhage, mass, mass effect, or midline shift. No hydrocephalus or extra-axial collection. Partial empty sella. Craniocervical junction within normal limits. Hemosiderin deposition is associated with encephalomalacia from a remote right MCA territory infarct, likely petechial hemorrhage. Additional punctate foci of hemosiderin deposition in the posterior right temporal lobe, likely sequela of prior hypertensive microhemorrhages, and left basal ganglia, likely related to remote infarct. Additional encephalomalacia in the left parieto-occipital region, with additional lacunar infarct in the left thalamus. Confluent T2 hyperintense signal in the periventricular white matter, likely the sequela of severe chronic small vessel ischemic disease. Vascular: Loss of the right vertebral artery flow void, which appears unchanged from the 2014 MRI. Otherwise normal arterial flow voids. Skull and upper cervical spine: Normal marrow signal. Sinuses/Orbits: No mucosal thickening in the left maxillary sinus. No acute finding in the orbits. Other: Fluid in the right mastoid and middle ear. IMPRESSION: 1. No acute intracranial process. No seizure focus is identified. No evidence of acute or subacute infarct 2. Loss of the right vertebral artery flow void, which appears unchanged from the 2014 MRI, and is favored to be  chronic. 3. Fluid in the right mastoid and middle ear. Correlate for otomastoiditis. Electronically Signed   By: Wiliam Ke M.D.   On: 01/07/2023 21:10   ECHOCARDIOGRAM COMPLETE  Result Date: 01/07/2023    ECHOCARDIOGRAM REPORT   Patient Name:   JASPAR MELLS Date of Exam: 01/07/2023 Medical Rec #:  [id number]         Height:       69.0 in Accession #:    [id number]        Weight:       162.7 lb Date of Birth:  February 10, 1956          BSA:          1.892 m Patient Age:    67 years          BP:           125/92 mmHg Patient Gender: M                 HR:           108 bpm. Exam Location:  Inpatient  Procedure: 2D Echo, Color Doppler and Cardiac Doppler Indications:    Pericardial Effusion  History:        Patient has prior history of Echocardiogram examinations, most                 recent 11/08/2021. CHF, Pericardial Disease, Stroke and Lung                 Cancer, Arrythmias:Atrial Fibrillation; Risk Factors:Diabetes                 and Dyslipidemia.  Sonographer:    Milbert Coulter Referring Phys: [id number] DAYNA N DUNN IMPRESSIONS  1. Left ventricular ejection fraction, by estimation, is 45 to 50%. The left ventricle has mildly decreased function. The left ventricle has no regional wall motion abnormalities. There is moderate left ventricular hypertrophy. Left ventricular diastolic parameters are consistent with Grade II diastolic dysfunction (pseudonormalization). Elevated left atrial pressure. The E/e' is 21.  2. Right ventricular systolic function is mildly reduced. The right ventricular size is normal.  3. Left atrial size was moderately dilated.  4. Right atrial size was dilated.  5. The mitral valve is normal in structure. Trivial mitral valve regurgitation. No evidence of mitral stenosis.  6. The aortic valve is tricuspid. Aortic valve regurgitation is not visualized. Aortic valve sclerosis is present, with no evidence of aortic valve stenosis.  7. Aortic dilatation noted. There is dilatation of the ascending  aorta, measuring 40 mm. Comparison(s): A prior study was performed on 11/08/2021. LVEF previously noted to be 55-60% and now 45-50%. See report for details. FINDINGS  Left Ventricle: Left ventricular ejection fraction, by estimation, is 45 to 50%. The left ventricle has mildly decreased function. The left ventricle has no regional wall motion abnormalities. The left ventricular internal cavity size was normal in size. There is moderate left ventricular hypertrophy. Left ventricular diastolic parameters are consistent with Grade II diastolic dysfunction (pseudonormalization). Elevated left atrial pressure. The E/e' is 21. Right Ventricle: The right ventricular size is normal. No increase in right ventricular wall thickness. Right ventricular systolic function is mildly reduced. Left Atrium: Left atrial size was moderately dilated. Right Atrium: Right atrial size was dilated. Pericardium: Trivial pericardial effusion is present. Mitral Valve: The mitral valve is normal in structure. Trivial mitral valve regurgitation. No evidence of mitral valve stenosis. Tricuspid Valve: The tricuspid valve is normal in structure. Tricuspid valve regurgitation is trivial. No evidence of tricuspid stenosis. Aortic Valve: The aortic valve is tricuspid. Aortic valve regurgitation is not visualized. Aortic valve sclerosis is present, with no evidence of aortic valve stenosis. Aortic valve mean gradient measures 2.0 mmHg. Aortic valve peak gradient measures 3.7  mmHg. Aortic valve area, by VTI measures 2.32 cm. Pulmonic Valve: The pulmonic valve was not well visualized. Pulmonic valve regurgitation is not visualized. No evidence of pulmonic stenosis. Aorta: Aortic dilatation noted and the aortic root is normal in size and structure. There is dilatation of the ascending aorta, measuring 40 mm. IAS/Shunts: The atrial septum is grossly normal. Additional Comments: There is pleural effusion in the left lateral region.  LEFT VENTRICLE PLAX 2D  LVIDd:         3.50 cm   Diastology LVIDs:         2.70 cm   LV e' medial:    5.22 cm/s LV PW:         1.40 cm   LV E/e' medial:  24.7 LV IVS:        1.40 cm  LV e' lateral:   7.72 cm/s LVOT diam:     2.10 cm   LV E/e' lateral: 16.7 LV SV:         38 LV SV Index:   20 LVOT Area:     3.46 cm  RIGHT VENTRICLE RV Basal diam:  3.40 cm RV Mid diam:    2.60 cm RV S prime:     6.74 cm/s TAPSE (M-mode): 1.2 cm LEFT ATRIUM             Index        RIGHT ATRIUM           Index LA diam:        4.10 cm 2.17 cm/m   RA Area:     21.70 cm LA Vol (A2C):   80.0 ml 42.27 ml/m  RA Volume:   60.30 ml  31.86 ml/m LA Vol (A4C):   81.3 ml 42.96 ml/m LA Biplane Vol: 82.3 ml 43.49 ml/m  AORTIC VALVE AV Area (Vmax):    2.38 cm AV Area (Vmean):   2.37 cm AV Area (VTI):     2.32 cm AV Vmax:           96.20 cm/s AV Vmean:          64.200 cm/s AV VTI:            0.166 m AV Peak Grad:      3.7 mmHg AV Mean Grad:      2.0 mmHg LVOT Vmax:         66.00 cm/s LVOT Vmean:        44.000 cm/s LVOT VTI:          0.111 m LVOT/AV VTI ratio: 0.67  AORTA Ao Root diam: 3.10 cm Ao Asc diam:  4.00 cm MITRAL VALVE MV Area (PHT): 4.29 cm     SHUNTS MV Decel Time: 177 msec     Systemic VTI:  0.11 m MV E velocity: 129.00 cm/s  Systemic Diam: 2.10 cm MV A velocity: 46.10 cm/s MV E/A ratio:  2.80 Sunit Tolia Electronically signed by Tessa Lerner Signature Date/Time: 01/07/2023/5:11:42 PM    Final    DG Lumbar Spine Complete  Result Date: 01/07/2023 CLINICAL DATA:  Pain after fall EXAM: LUMBAR SPINE - COMPLETE 5 VIEW COMPARISON:  None Available. FINDINGS: Five lumbar-type vertebral bodies. Preserved vertebral body heights and alignment. No listhesis. Prominent lower lumbar facet degenerative changes. Multilevel disc height loss, most severe at L2-3 but moderate L4-5 and L5-S1. Diffuse scattered endplate osteophytes as well. No spondylolysis. Prominent vascular calcifications. Overlapping cardiac leads. Recommend continue precautions until clinical  clearance and if there is further concern of injury additional workup with CT as clinically appropriate for further sensitivity. IMPRESSION: Moderate degenerative changes. Electronically Signed   By: Karen Kays M.D.   On: 01/07/2023 14:19   DG Chest Port 1 View  Result Date: 01/07/2023 CLINICAL DATA:  Pain after fall. EXAM: PORTABLE CHEST 1 VIEW COMPARISON:  X-ray 01/04/2023. FINDINGS: Enlarged cardiopericardial silhouette. Slightly increased left retrocardiac opacity with small effusion. No pneumothorax or edema. No right-sided consolidation. Calcified aorta. Overlapping cardiac leads. IMPRESSION: Slightly increased left retrocardiac opacity and effusion. Recommend continued follow up Electronically Signed   By: Karen Kays M.D.   On: 01/07/2023 14:18   DG Hip Unilat W or Wo Pelvis 2-3 Views Left  Result Date: 01/07/2023 CLINICAL DATA:  Pain after fall EXAM: DG HIP (WITH OR WITHOUT PELVIS) 4V LEFT COMPARISON:  None Available.  FINDINGS: No fracture or dislocation. Preserved joint spaces and bone mineralization. Hyperostosis. Diffuse vascular calcifications. IMPRESSION: No acute osseous abnormality. Electronically Signed   By: Karen Kays M.D.   On: 01/07/2023 14:16   CT Head Wo Contrast  Result Date: 01/07/2023 CLINICAL DATA:  Head trauma, minor EXAM: CT HEAD WITHOUT CONTRAST TECHNIQUE: Contiguous axial images were obtained from the base of the skull through the vertex without intravenous contrast. RADIATION DOSE REDUCTION: This exam was performed according to the departmental dose-optimization program which includes automated exposure control, adjustment of the mA and/or kV according to patient size and/or use of iterative reconstruction technique. COMPARISON:  01/04/2023 FINDINGS: Brain: No change since the prior examination. No focal abnormality seen affecting the brainstem or cerebellum. Cerebral hemispheres show old infarctions in the parietooccipital regions, more extensive on the right than  the left, with volume loss encephalomalacia. Chronic small-vessel ischemic changes are present otherwise within the cerebral hemispheric white matter. Old basal ganglia infarctions on the left. No sign of acute stroke, mass, hemorrhage, hydrocephalus or extra-axial collection. Vascular: There is atherosclerotic calcification of the major vessels at the base of the brain. Skull: Negative Sinuses/Orbits: Clear/normal Other: None IMPRESSION: No acute or traumatic finding. 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 Chronic small-vessel ischemic changes otherwise within the cerebral hemispheric white matter. Electronically Signed   By: Paulina Fusi M.D.   On: 01/07/2023 13:22        Scheduled Meds:  apixaban  5 mg Oral BID   atorvastatin  20 mg Oral Daily   diltiazem  120 mg Oral BID   empagliflozin  25 mg Oral QAC breakfast   hydrALAZINE  50 mg Oral TID   insulin aspart  0-5 Units Subcutaneous QHS   insulin aspart  0-6 Units Subcutaneous TID WC   levETIRAcetam  500 mg Oral BID   losartan  100 mg Oral Daily   [START ON 01/09/2023] metoprolol succinate  200 mg Oral Daily   metoprolol tartrate  100 mg Oral Once   Continuous Infusions:   LOS: 0 days    Time spent: 35 minutes    Dorcas Carrow, MD [facility]

## 2023-01-08 NOTE — TOC Initial Note (Addendum)
Transition of Care [facility]) - Initial/Assessment Note    Patient Details  Name: Brian [name] MRN: [id number] Date of Birth: 03/26/1955  Transition of Care [facility]) CM/SW Contact:    Erin Sons, [name] Phone Number: 01/08/2023, 2:11 PM  Clinical Narrative:                  CSW met with pt and pt's daughter bedside. Discussed SNF recommendation. Pt lives at home with his 69 year old mother who is currently hospitalized at [facility] as well. Daughter lives locally and pt's son is out of state. They are agreeable to SNF w/u. FL2 completed and bed requests in hub. TOC will follow up with bed offers.   1510: Spoke with daughter over the phone about cancer treatments. She explains pt has finished radiation treatments and has a follow up at the cancer center in about one month.   Expected Discharge Plan: Skilled Nursing Facility Barriers to Discharge: Continued Medical Work up, English as a second language teacher, SNF Pending bed offer   Patient Goals and CMS Choice Patient states their goals for this hospitalization and ongoing recovery are:: wants rehab          Expected Discharge Plan and Services       Living arrangements for the past 2 months: Single Family Home                                      Prior Living Arrangements/Services Living arrangements for the past 2 months: Single Family Home Lives with:: Parents Patient language and need for interpreter reviewed:: Yes        Need for Family Participation in Patient Care: Yes (Comment) Care giver support system in place?: Yes (comment)   Criminal Activity/Legal Involvement Pertinent to Current Situation/Hospitalization: No - Comment as needed  Activities of Daily Living   ADL Screening (condition at time of admission) Independently performs ADLs?: No Does the patient have a NEW difficulty with bathing/dressing/toileting/self-feeding that is expected to last >3 days?: No Does the patient have a NEW difficulty with getting  in/out of bed, walking, or climbing stairs that is expected to last >3 days?: Yes (Initiates electronic notice to provider for possible PT consult) (needs help) Does the patient have a NEW difficulty with communication that is expected to last >3 days?: No Is the patient deaf or have difficulty hearing?: Yes Does the patient have difficulty seeing, even when wearing glasses/contacts?: No Does the patient have difficulty concentrating, remembering, or making decisions?: No  Permission Sought/Granted   Permission granted to share information with : Yes, Verbal Permission Granted  Share Information with NAME: Pershing Proud (Daughter)  [phone] (Mobile)           Emotional Assessment Appearance:: Appears stated age Attitude/Demeanor/Rapport: Engaged Affect (typically observed): Accepting Orientation: : Oriented to Self, Oriented to Place, Oriented to  Time, Oriented to Situation Alcohol / Substance Use: Not Applicable Psych Involvement: No (comment)  Admission diagnosis:  Pericardial effusion [I31.39] Seizure [facility]) [R56.9] Patient Active Problem List   Diagnosis Date Noted   Seizure (HCC) 01/08/2023   Pericardial effusion 01/07/2023   Seizure-like activity (HCC) 01/07/2023   Gait disturbance 01/07/2023   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) 11/18/2022   Pleural effusion 10/30/2022   History of head and neck cancer 10/17/2022   Chronic congestive heart failure with left ventricular  diastolic dysfunction (HCC) 11/19/2021   History of CVA with residual deficit 11/03/2021   Hypokalemia 11/03/2021   Dysphagia 11/03/2021   Fall at home, subsequent encounter 11/03/2021   Diastolic dysfunction 11/03/2021   Pancytopenia (HCC) 11/03/2021   Primary cancer of left lower lobe of lung (HCC) 06/13/2021   Normocytic normochromic anemia 03/20/2021   Stage 3b chronic kidney disease (HCC) 03/13/2021    Syncope and collapse 02/02/2021   Dilated cardiomyopathy (HCC) 02/02/2021   Weight loss, unintentional 05/26/2020   Insomnia 05/09/2020   Chronic kidney disease, stage III (moderate) (HCC) 05/09/2020   Chemotherapy induced diarrhea 05/09/2020   Malignant neoplasm of base of tongue (HCC) 03/02/2020   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 [name])    Status epilepticus (HCC) 01/18/2013   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 status epilepticus, not intractable (HCC) 01/15/2013   Hyperlipidemia    Long term current use of anticoagulant 03/11/2010   Diabetes mellitus, type II (HCC) 12/20/2009   Essential hypertension, benign 12/20/2009   Atrial fibrillation (HCC) 12/20/2009   Systolic heart failure (HCC) 12/20/2009   CEREBROVASCULAR ACCIDENT 12/20/2009   PCP:  [name], MD Pharmacy:   [facility] [phone] Ginette Otto, [place] - [address] [place] [address] Phone: [phone] Fax: [phone]     Social Determinants of Health (SDOH) Social History: SDOH Screenings   Food Insecurity: No Food Insecurity (01/07/2023)  Housing: Low Risk  (01/07/2023)  Transportation Needs: No Transportation Needs (01/07/2023)  Utilities: Not At Risk (01/07/2023)  Alcohol Screen: Low Risk  (11/14/2021)  Depression (PHQ2-9): Low Risk  (04/05/2022)  Financial Resource Strain: Low Risk  (04/05/2022)  Physical Activity: Inactive (04/05/2022)  Social Connections: Socially Isolated (04/05/2022)  Stress: No Stress Concern Present (04/05/2022)  Tobacco Use: Low Risk  (01/07/2023)   SDOH Interventions:     Readmission Risk Interventions    11/08/2021    9:46 AM  Readmission Risk Prevention Plan  Transportation Screening Complete  PCP or Specialist Appt within 3-5 Days Complete  HRI or Home Care Consult Complete  Social Work Consult for Recovery Care Planning/Counseling Complete  Palliative Care Screening Not Applicable  Medication Review [employer])  Complete

## 2023-01-08 NOTE — [place] FL2 (Signed)
[place] MEDICAID FL2 LEVEL OF CARE FORM     IDENTIFICATION  Patient Name: Brian [name] Birthdate: [birth year] Sex: male Admission Date (Current Location): 01/07/2023  [facility] and [place] Number:  Producer, television/film/video and Address:  [facility] [place] [place], [address] [place], [place] [address]      Provider Number: [id number]  Attending Physician Name and Address:  Dorcas Carrow, MD  Relative Name and Phone Number:  Pershing Proud (Daughter)  [phone] [facility])    Current Level of Care: Hospital Recommended Level of Care: Skilled Nursing Facility Prior Approval Number:    Date Approved/Denied:   PASRR Number: [id number]  Discharge Plan: SNF    Current Diagnoses: Patient Active Problem List   Diagnosis Date Noted   Seizure (HCC) 01/08/2023   Pericardial effusion 01/07/2023   Seizure-like activity (HCC) 01/07/2023   Gait disturbance 01/07/2023   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) 11/18/2022   Pleural effusion 10/30/2022   History of head and neck cancer 10/17/2022   Chronic congestive heart failure with left ventricular diastolic dysfunction (HCC) 11/19/2021   History of CVA with residual deficit 11/03/2021   Hypokalemia 11/03/2021   Dysphagia 11/03/2021   Fall at home, subsequent encounter 11/03/2021   Diastolic dysfunction 11/03/2021   Pancytopenia (HCC) 11/03/2021   Primary cancer of left lower lobe of lung (HCC) 06/13/2021   Normocytic normochromic anemia 03/20/2021   Stage 3b chronic kidney disease (HCC) 03/13/2021   Syncope and collapse 02/02/2021   Dilated cardiomyopathy (HCC) 02/02/2021   Weight loss, unintentional 05/26/2020   Insomnia 05/09/2020   Chronic kidney disease, stage III (moderate) (HCC) 05/09/2020   Chemotherapy induced diarrhea 05/09/2020   Malignant neoplasm of base of tongue (HCC) 03/02/2020   Proliferative retinopathy due to  DM [facility])    Status epilepticus (HCC) 01/18/2013   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 status epilepticus, not intractable (HCC) 01/15/2013   Hyperlipidemia    Long term current use of anticoagulant 03/11/2010   Diabetes mellitus, type II (HCC) 12/20/2009   Essential hypertension, benign 12/20/2009   Atrial fibrillation (HCC) 12/20/2009   Systolic heart failure (HCC) 12/20/2009   CEREBROVASCULAR ACCIDENT 12/20/2009    Orientation RESPIRATION BLADDER Height & Weight     Self, Time, Situation, Place  Normal Continent Weight: 162 lb 11.2 oz (73.8 kg) Height:  5\' 9"  (175.3 cm)  BEHAVIORAL SYMPTOMS/MOOD NEUROLOGICAL BOWEL NUTRITION STATUS      Continent Diet (see d/c summary)  AMBULATORY STATUS COMMUNICATION OF NEEDS Skin   Extensive Assist Verbally Normal                       Personal Care Assistance Level of Assistance  Bathing, Feeding, Dressing Bathing Assistance: Maximum assistance Feeding assistance: Independent Dressing Assistance: Maximum assistance     Functional Limitations Info  Sight, Hearing, Speech Sight Info: Adequate Hearing Info: Adequate Speech Info: Adequate    SPECIAL CARE FACTORS FREQUENCY  OT (By licensed OT), PT (By licensed PT)     PT Frequency: 5x/week OT Frequency: 5x/week            Contractures Contractures Info: Present (left hand)    Additional Factors Info  Code Status, Allergies Code Status Info: Full code Allergies Info: Metformin And Related, codeine           Current Medications (01/08/2023):  This is the current hospital active medication list Current Facility-Administered Medications  Medication Dose Route  Frequency Provider Last Rate Last Admin   acetaminophen (TYLENOL) tablet 650 mg  650 mg Oral Q6H PRN Clydie Braun, MD       Or   acetaminophen (TYLENOL) suppository 650 mg  650 mg Rectal Q6H PRN Madelyn Flavors A, MD       albuterol (PROVENTIL) (2.5 MG/3ML) 0.083% nebulizer solution 2.5 mg  2.5 mg Nebulization Q6H PRN Madelyn Flavors A, MD       apixaban (ELIQUIS) tablet 5 mg  5 mg Oral BID Katrinka Blazing, Rondell A, MD   5 mg at 01/08/23 0927   atorvastatin (LIPITOR) tablet 20 mg  20 mg Oral Daily Smith, Rondell A, MD   20 mg at 01/08/23 0926   diltiazem (CARDIZEM CD) 24 hr capsule 120 mg  120 mg Oral BID Madelyn Flavors A, MD   120 mg at 01/08/23 0926   empagliflozin (JARDIANCE) tablet 25 mg  25 mg Oral QAC breakfast Madelyn Flavors A, MD   25 mg at 01/08/23 0272   hydrALAZINE (APRESOLINE) tablet 50 mg  50 mg Oral TID Madelyn Flavors A, MD   50 mg at 01/08/23 0926   insulin aspart (novoLOG) injection 0-5 Units  0-5 Units Subcutaneous QHS Dorcas Carrow, MD       insulin aspart (novoLOG) injection 0-6 Units  0-6 Units Subcutaneous TID WC Dorcas Carrow, MD       levETIRAcetam (KEPPRA) tablet 500 mg  500 mg Oral BID Katrinka Blazing, Rondell A, MD   500 mg at 01/08/23 0926   LORazepam (ATIVAN) injection 1-2 mg  1-2 mg Intravenous Q2H PRN Madelyn Flavors A, MD       losartan (COZAAR) tablet 100 mg  100 mg Oral Daily Smith, Rondell A, MD   100 mg at 01/08/23 0926   [START ON 01/09/2023] metoprolol succinate (TOPROL-XL) 24 hr tablet 200 mg  200 mg Oral Daily Chilton Si, MD       metoprolol tartrate (LOPRESSOR) tablet 100 mg  100 mg Oral Once Chilton Si, MD         Discharge Medications: Please see discharge summary for a list of discharge medications.  Relevant Imaging Results:  Relevant Lab Results:   Additional Information SSN [id number] [place], [place]

## 2023-01-08 NOTE — Progress Notes (Signed)
Patient Name: Brian [name] Date of Encounter: 01/08/2023 [facility] Cardiologist: Rollene Rotunda, MD   Interval Summary  .    Ready to go upstairs.    Vital Signs .    Vitals:   01/08/23 0800 01/08/23 0900 01/08/23 0910 01/08/23 0931  BP: (!) 125/91 (!) 122/99 122/89   Pulse: 61 63 (!) 120   Resp: (!) 23 (!) 26 (!) 25   Temp:    97.7 F (36.5 C)  TempSrc:      SpO2: 100% 100% 99%   Weight:      Height:        Intake/Output Summary (Last 24 hours) at 01/08/2023 1051 Last data filed at 01/08/2023 0159 Gross per 24 hour  Intake 1000 ml  Output 500 ml  Net 500 ml      01/07/2023   11:34 AM 01/04/2023   12:37 PM 12/14/2022    1:22 PM  Last 3 Weights  Weight (lbs) 162 lb 11.2 oz 162 lb 11.2 oz 162 lb 12.8 oz  Weight (kg) 73.8 kg 73.8 kg 73.846 kg      Telemetry/ECG    Atrial fibrillation.  Rate 100s-120s - Personally Reviewed  Physical Exam .    BP 122/89 (BP Location: Right Arm)   Pulse (!) 120   Temp 97.7 F (36.5 C)   Resp (!) 25   Ht 5\' 9"  (1.753 m)   Wt 73.8 kg   SpO2 99%   BMI 24.03 kg/m  GENERAL:  Chronically ill-appearing.  Frail.  HEENT: Pupils equal round and reactive, fundi not visualized, oral mucosa unremarkable NECK:  No jugular venous distention, waveform within normal limits, carotid upstroke brisk and symmetric, no bruits, no thyromegaly LUNGS:  Clear to auscultation bilaterally HEART:  Tachycardic.  Irregularly irregular.  PMI not displaced or sustained,S1 and S2 within normal limits, no S3, no S4, no clicks, no rubs, no murmurs ABD:  Flat, positive bowel sounds normal in frequency in pitch, no bruits, no rebound, no guarding, no midline pulsatile mass, no hepatomegaly, no splenomegaly EXT:  2 plus pulses throughout, no edema, no cyanosis no clubbing SKIN:  No rashes no nodules NEURO:  Cranial nerves II through XII grossly intact, motor grossly intact throughout PSYCH:  Cognitively intact, oriented to person place and  time    Assessment & Plan .     67 y.o. male with a hx of chronic systolic diastolic heart failure, CVA, hypertension, PAF, diabetes, and hyperlipidemia admitted for seizure.  Cardiology consulted for the evaluation of pericardial effusion   # Chronic systolic and diastolic HF:  # Hypertension:  LVEF this admission 45-50%.  I have reviewed this echo and his echo 10/2021.  I don't see any significant difference in systolic function.  LVEF has been as low as 25-30% and subsequently recovered.  Continue guideline directed medical therapy with Jardiance, losartan, and hydralazine.  Blood pressure is well-controlled.  He has no heart failure symptoms and is euvolemic. Switch carvedilol to metoprolol given poorly controlled heart rates.  # Pericardial effusion:  Trivial pericardial effusion without evidence of tamponade.  There is no evidence of uremia.  Will check TSH.   # PAF:  Maintain K>4, Mg >2.  Continue Eliquis and diltiazem.  Switch carvedilol to metoprolol succinate 200 mg daily.  # CKD 3b:  Renal function stable.   # Seizure:  Management per primary team   For questions or updates, please contact [facility] Please consult [email or web address] for contact info under  Signed, Chilton Si, MD

## 2023-01-08 NOTE — Evaluation (Signed)
Physical Therapy Evaluation Patient Details Name: Brian [name] MRN: [id number] DOB: 10/07/1955 Today's Date: 01/08/2023  History of Present Illness  Pt is a 67 y.o. male admitted 11/18 for seizure. Of note, he presented to the ED 11/15 following a fall where he struck his head and was discharged home same day. PMH:  HTN, hyperlipidemia, R MCA CVA in 2011 with residual deficits, a fib on Eliquis, DM2, seizure disorder, tongue CA s/p chemoradiation, and lung CA   Clinical Impression  Pt admitted with above diagnosis. PTA pt lived at home with his 11 y.o. mother, I/mod I mobility with/without SPC. Pt currently with functional limitations due to the deficits listed below (see PT Problem List). On eval, pt required mod assist bed mobility, mod assist transfers, and mod assist amb 20' with RW. Pt presents with deficits in strength, balance, and activity tolerance. Pt with c/o LLE pain, limiting gait distance. Pt will benefit from acute skilled PT to increase their independence and safety with mobility to allow discharge. Upon d/c, pt would benefit from further therapy in inpatient setting, < 3 hours/day.          If plan is discharge home, recommend the following: A lot of help with walking and/or transfers;A lot of help with bathing/dressing/bathroom;Assistance with cooking/housework;Assist for transportation;Help with stairs or ramp for entrance   Can travel by private vehicle   Yes    Equipment Recommendations Rolling walker (2 wheels)  Recommendations for Other Services       Functional Status Assessment Patient has had a recent decline in their functional status and demonstrates the ability to make significant improvements in function in a reasonable and predictable amount of time.     Precautions / Restrictions Precautions Precautions: Fall      Mobility  Bed Mobility Overal bed mobility: Needs Assistance Bed Mobility: Supine to Sit, Sit to Supine     Supine to sit: Mod  assist Sit to supine: Mod assist   General bed mobility comments: mod assist from/to stretcher in ED, increased time    Transfers Overall transfer level: Needs assistance Equipment used: Rolling walker (2 wheels) Transfers: Sit to/from Stand Sit to Stand: Mod assist           General transfer comment: posterior lean requiring increased time to stabilize balance    Ambulation/Gait Ambulation/Gait assistance: Mod assist Gait Distance (Feet): 20 Feet Assistive device: Rolling walker (2 wheels) Gait Pattern/deviations: Step-to pattern, Step-through pattern, Decreased weight shift to left, Decreased step length - left, Trunk flexed Gait velocity: decreased Gait velocity interpretation: <1.8 ft/sec, indicate of risk for recurrent falls   General Gait Details: Difficulty gripping RW on L due to residual deficits from previous CVA. Very unsteady. Distance limited by fatigue and LLE pain.  Stairs            Wheelchair Mobility     Tilt Bed    Modified Rankin (Stroke Patients Only)       Balance Overall balance assessment: Needs assistance Sitting-balance support: Feet supported, Bilateral upper extremity supported Sitting balance-Leahy Scale: Fair     Standing balance support: Reliant on assistive device for balance, Bilateral upper extremity supported, During functional activity Standing balance-Leahy Scale: Poor                               Pertinent Vitals/Pain Pain Assessment Pain Assessment: Faces Faces Pain Scale: Hurts little more Pain Location: LLE Pain Descriptors / Indicators: Sore,  Grimacing, Guarding Pain Intervention(s): Limited activity within patient's tolerance, Repositioned    Home Living Family/patient expects to be discharged to:: Private residence Living Arrangements: Parent (4 y.o. mother) Available Help at Discharge: Family;Available PRN/intermittently Type of Home: House Home Access: Stairs to enter Entrance  Stairs-Rails: Left Entrance Stairs-Number of Steps: 3   Home Layout: One level Home Equipment: Cane - single point Additional Comments: Pt fell in [place] on 11/15 resulting in ED visit. He reports having used Uber to get to the restuarant.    Prior Function Prior Level of Function : Independent/Modified Independent             Mobility Comments: amb with/without cane ADLs Comments: no assist at baseline     Extremity/Trunk Assessment   Upper Extremity Assessment Upper Extremity Assessment: LUE deficits/detail LUE Deficits / Details: h/o R CVA with residual L deficits, hand resting in flexed position    Lower Extremity Assessment Lower Extremity Assessment: Generalized weakness;LLE deficits/detail LLE Deficits / Details: h/o R CVA with residual L deficits    Cervical / Trunk Assessment Cervical / Trunk Assessment: Kyphotic  Communication   Communication Communication: No apparent difficulties  Cognition Arousal: Alert Behavior During Therapy: WFL for tasks assessed/performed Overall Cognitive Status: Within Functional Limits for tasks assessed                                          General Comments General comments (skin integrity, edema, etc.): HR 100s-120s    Exercises     Assessment/Plan    PT Assessment Patient needs continued PT services  PT Problem List Decreased strength;Decreased balance;Pain;Decreased mobility;Decreased knowledge of use of DME;Decreased activity tolerance       PT Treatment Interventions DME instruction;Functional mobility training;Balance training;Patient/family education;Gait training;Therapeutic activities;Therapeutic exercise;Stair training    PT Goals (Current goals can be found in the Care Plan section)  Acute Rehab PT Goals Patient Stated Goal: get stronger PT Goal Formulation: With patient Time For Goal Achievement: 01/22/23 Potential to Achieve Goals: Good    Frequency Min 1X/week      Co-evaluation               AM-PAC PT "6 Clicks" Mobility  Outcome Measure Help needed turning from your back to your side while in a flat bed without using bedrails?: A Lot Help needed moving from lying on your back to sitting on the side of a flat bed without using bedrails?: A Lot Help needed moving to and from a bed to a chair (including a wheelchair)?: A Lot Help needed standing up from a chair using your arms (e.g., wheelchair or bedside chair)?: A Lot Help needed to walk in hospital room?: A Lot Help needed climbing 3-5 steps with a railing? : Total 6 Click Score: 11    End of Session Equipment Utilized During Treatment: Gait belt Activity Tolerance: Patient tolerated treatment well Patient left: in bed;with call bell/phone within reach Nurse Communication: Mobility status PT Visit Diagnosis: Unsteadiness on feet (R26.81);Difficulty in walking, not elsewhere classified (R26.2);Pain Pain - Right/Left: Left Pain - part of body: Leg    Time: 0843-0900 PT Time Calculation (min) (ACUTE ONLY): 17 min   Charges:   PT Evaluation $PT Eval Moderate Complexity: 1 Mod   PT General Charges $$ ACUTE PT VISIT: 1 Visit         Ferd Glassing., PT  Office # [phone]  Ilda Foil 01/08/2023, 9:47 AM

## 2023-01-08 NOTE — Procedures (Signed)
Patient Name: Brian [name]  MRN: [id number]  Epilepsy Attending: Charlsie Quest  Referring Physician/Provider: Clydie Braun, MD  Date: 01/08/2023 Duration: 22.22 mins  Patient history: 67 yo M presents after having a reported seizure witnessed by his daughter who states that he stiffened up prior to falling back on the bed with generalized tonic-clonic activity. EEG to evaluate for seizure  Level of alertness: Awake  AEDs during EEG study: LEV  Technical aspects: This EEG study was done with scalp electrodes positioned according to the 10-20 International system of electrode placement. Electrical activity was reviewed with band pass filter of 1-70Hz , sensitivity of 7 uV/mm, display speed of 32mm/sec with a 60Hz  notched filter applied as appropriate. EEG data were recorded continuously and digitally stored.  Video monitoring was available and reviewed as appropriate.  Description: The posterior dominant rhythm consists of 8 Hz activity of moderate voltage (25-35 uV) seen predominantly in posterior head regions, symmetric and reactive to eye opening and eye closing. Hyperventilation and photic stimulation were not performed.     IMPRESSION: This study is within normal limits. No seizures or epileptiform discharges were seen throughout the recording.  A normal interictal EEG does not exclude the diagnosis of epilepsy.  Keland Peyton Annabelle Harman

## 2023-01-09 DIAGNOSIS — R569 Unspecified convulsions: Secondary | ICD-10-CM | POA: Diagnosis not present

## 2023-01-09 DIAGNOSIS — W19XXXD Unspecified fall, subsequent encounter: Secondary | ICD-10-CM | POA: Diagnosis not present

## 2023-01-09 DIAGNOSIS — R269 Unspecified abnormalities of gait and mobility: Secondary | ICD-10-CM | POA: Diagnosis not present

## 2023-01-09 DIAGNOSIS — I3139 Other pericardial effusion (noninflammatory): Secondary | ICD-10-CM | POA: Diagnosis not present

## 2023-01-09 DIAGNOSIS — Y92009 Unspecified place in unspecified non-institutional (private) residence as the place of occurrence of the external cause: Secondary | ICD-10-CM | POA: Diagnosis not present

## 2023-01-09 LAB — GLUCOSE, CAPILLARY
Glucose-Capillary: 120 mg/dL — ABNORMAL HIGH (ref 70–99)
Glucose-Capillary: 159 mg/dL — ABNORMAL HIGH (ref 70–99)
Glucose-Capillary: 142 mg/dL — ABNORMAL HIGH (ref 70–99)
Glucose-Capillary: 155 mg/dL — ABNORMAL HIGH (ref 70–99)

## 2023-01-09 MED ORDER — DILTIAZEM HCL ER COATED BEADS 180 MG PO CP24
180.0000 mg | ORAL_CAPSULE | Freq: Two times a day (BID) | ORAL | Status: AC
  Administered 2023-01-09: 180 mg via ORAL
  Filled 2023-01-09: qty 1

## 2023-01-09 MED ORDER — DILTIAZEM HCL ER COATED BEADS 180 MG PO CP24
360.0000 mg | ORAL_CAPSULE | Freq: Every day | ORAL | Status: DC
  Administered 2023-01-10: 360 mg via ORAL
  Filled 2023-01-09: qty 2

## 2023-01-09 MED ORDER — POTASSIUM CHLORIDE CRYS ER 20 MEQ PO TBCR
40.0000 meq | EXTENDED_RELEASE_TABLET | Freq: Once | ORAL | Status: AC
  Administered 2023-01-09: 40 meq via ORAL
  Filled 2023-01-09: qty 2

## 2023-01-09 MED ORDER — LOSARTAN POTASSIUM 50 MG PO TABS
50.0000 mg | ORAL_TABLET | Freq: Every day | ORAL | Status: DC
  Administered 2023-01-10: 50 mg via ORAL
  Filled 2023-01-09: qty 1

## 2023-01-09 NOTE — TOC Progression Note (Signed)
Transition of Care [facility]) - Progression Note    Patient Details  Name: Brian [name] MRN: [id number] Date of Birth: [birth year]  Transition of Care [facility]) CM/SW Contact  Dodd Schmid Reeves Forth, Student-Social Work Phone Number: 01/09/2023, 12:26 PM  Clinical Narrative:    MSW Intern spoke with pt about SNF placement following discharge. Pt stated [facility] was the only facility he was familiar with and they have a private room.    Expected Discharge Plan: Skilled Nursing Facility Barriers to Discharge: Continued Medical Work up, English as a second language teacher, SNF Pending bed offer  Expected Discharge Plan and Services       Living arrangements for the past 2 months: Single Family Home                                       Social Determinants of Health (SDOH) Interventions SDOH Screenings   Food Insecurity: No Food Insecurity (01/07/2023)  Housing: Low Risk  (01/07/2023)  Transportation Needs: No Transportation Needs (01/07/2023)  Utilities: Not At Risk (01/07/2023)  Alcohol Screen: Low Risk  (11/14/2021)  Depression (PHQ2-9): Low Risk  (04/05/2022)  Financial Resource Strain: Low Risk  (04/05/2022)  Physical Activity: Inactive (04/05/2022)  Social Connections: Socially Isolated (04/05/2022)  Stress: No Stress Concern Present (04/05/2022)  Tobacco Use: Low Risk  (01/07/2023)    Readmission Risk Interventions    11/08/2021    9:46 AM  Readmission Risk Prevention Plan  Transportation Screening Complete  PCP or Specialist Appt within 3-5 Days Complete  HRI or Home Care Consult Complete  Social Work Consult for Recovery Care Planning/Counseling Complete  Palliative Care Screening Not Applicable  Medication Review Oceanographer) Complete

## 2023-01-09 NOTE — Discharge Instructions (Signed)

## 2023-01-09 NOTE — Progress Notes (Signed)
Mobility Specialist Progress Note;   01/09/23 1535  Mobility  Activity Transferred from chair to bed  Level of Assistance Minimal assist, patient does 75% or more  Assistive Device Front wheel walker  Activity Response Tolerated well  Mobility Referral Yes  $Mobility charge 1 Mobility  Mobility Specialist Start Time (ACUTE ONLY) 1535  Mobility Specialist Stop Time (ACUTE ONLY) 1545  Mobility Specialist Time Calculation (min) (ACUTE ONLY) 10 min   Answered pts callbell requesting assistance back to bed. Pt agreeable to mobility. Required MinA for STS and during transfer from chair to bed. Pt still displaying difficult ability to grip RW with L hand as well as moving LLE, requiring min verbal cues to correct. Pt left in bed with all needs met. Alarm on.   Caesar Bookman Mobility Specialist Please contact via SecureChat or Rehab Office [phone]

## 2023-01-09 NOTE — Progress Notes (Signed)
Mobility Specialist Progress Note;   01/09/23 1120  Mobility  Activity Transferred from bed to chair  Level of Assistance Minimal assist, patient does 75% or more  Assistive Device Front wheel walker  Activity Response Tolerated well  Mobility Referral Yes  $Mobility charge 1 Mobility  Mobility Specialist Start Time (ACUTE ONLY) 1120  Mobility Specialist Stop Time (ACUTE ONLY) 1135  Mobility Specialist Time Calculation (min) (ACUTE ONLY) 15 min   Pt agreeable to mobility. Required minA for STS and during transfer from bed to chair. Displayed having a difficult time gripping the RW L handle, stated he did not have much grip in L hand. Min verbal cues required for RW proximity during transfer. VSS throughout. No c/o during session. Pt left in chair with all needs met.   Caesar Bookman Mobility Specialist Please contact via SecureChat or Rehab Office [phone]

## 2023-01-09 NOTE — Plan of Care (Signed)
  Problem: Safety: Goal: Ability to remain free from injury will improve Outcome: Progressing   

## 2023-01-09 NOTE — Plan of Care (Signed)

## 2023-01-09 NOTE — Progress Notes (Signed)
PROGRESS NOTE        PATIENT DETAILS Name: Brian [name] Age: 67 y.o. Sex: male Date of Birth: Apr 25, 1955 Admit Date: 01/07/2023 Admitting Physician Dorcas Carrow, MD MWN:[id number], Priscille Heidelberg, MD  Brief Summary: Patient is a 67 y.o.  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  Objective: Vitals: Blood pressure (!) 123/95, pulse (!) 106, temperature 97.8 F (36.6 C), temperature source Oral, resp. rate 20, height 5\' 9"  (1.753 m), weight 73.8 kg, SpO2 95%.   Exam: Gen Exam:Alert awake-not in any distress HEENT:atraumatic, normocephalic Chest: B/L clear to auscultation anteriorly CVS:S1S2 regular Abdomen:soft non tender, non distended Extremities:no edema Neurology: Non focal Skin: no rash  Pertinent Labs/Radiology:    Latest Ref Rng & Units 01/08/2023    5:19 AM 01/07/2023   11:49 AM 01/04/2023    2:13 PM  CBC  WBC 4.0 - 10.5 K/uL 2.1  2.2  3.1   Hemoglobin 13.0 - 17.0 g/dL 8.9  9.0  64.4   Hematocrit 39.0 - 52.0 % 27.6  28.0  32.7   Platelets 150 - 400 K/uL 173  184  205     Lab Results  Component Value Date   NA 132 (L) 01/08/2023   K 3.4 (L) 01/08/2023   CL 105 01/08/2023   CO2 20 (L) 01/08/2023       Assessment/Plan: Breakthrough seizure  Breakthrough seizure secondary to noncompliance to antiepileptics-Keppra was discontinued several years back MRI brain/EEG stable No further seizures since being started on Keppra Outpatient follow-up with neurology  Chronic combined HFpEF/HFrEF Euvolemic Continue Jardiance/losartan/hydralazine/metoprolol  Pericardial effusion Trivial per echocardiogram TSH stable  PAF Diltiazem/metoprolol/Eliquis Telemetry monitoring  History of CVA with left hemiparesis At baseline Statin/Eliquis  CKD stage IIIb Baseline  HTN BP stable Losartan/hydralazine/metoprolol/Cardizem  HLD Statin  DM-2 (A1c 6.3 on 8/27) CBG stable Jardiance/SSI  Recent Labs    01/08/23 1728 01/08/23 2048 01/09/23 0821  GLUCAP 239* 140* 120*     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   Weight as of this encounter: 73.8 kg.   Code status:   Code Status: Full Code   DVT Prophylaxis: apixaban (ELIQUIS) tablet 5 mg    Family Communication: None at bedside   Disposition Plan: Status is: Observation The patient will require care spanning > 2 midnights and should be moved to inpatient because: Severity of illness   Planned Discharge Destination:Skilled nursing facility   Diet: Diet Order  Diet heart healthy/carb modified Room service appropriate? Yes; Fluid consistency: Thin  Diet effective now                     Antimicrobial agents: Anti-infectives (From admission, onward)    None        MEDICATIONS: Scheduled Meds:  apixaban  5 mg Oral BID   atorvastatin  20 mg Oral Daily    diltiazem  120 mg Oral BID   empagliflozin  25 mg Oral QAC breakfast   gabapentin  100 mg Oral QHS   hydrALAZINE  50 mg Oral TID   insulin aspart  0-5 Units Subcutaneous QHS   insulin aspart  0-6 Units Subcutaneous TID WC   levETIRAcetam  500 mg Oral BID   losartan  100 mg Oral Daily   metoprolol succinate  200 mg Oral Daily   metoprolol tartrate  100 mg Oral Once   potassium chloride  40 mEq Oral Once   Continuous Infusions: PRN Meds:.acetaminophen **OR** acetaminophen, albuterol, LORazepam   I have personally reviewed following labs and imaging studies  LABORATORY DATA: CBC: Recent Labs  Lab 01/04/23 1242 01/04/23 1413 01/07/23 1149 01/08/23 0519  WBC  --  3.1* 2.2* 2.1*  NEUTROABS  --  2.6 1.8  --   HGB 9.9* 10.6* 9.0* 8.9*  HCT 29.0* 32.7* 28.0* 27.6*  MCV  --  85.8 85.4 84.7  PLT  --  205 184 173    Basic Metabolic Panel: Recent Labs  Lab 01/04/23 1242 01/04/23 1246 01/07/23 1149 01/08/23 0519  NA 135 134* 134* 132*  K 3.4* 3.3* 3.2* 3.4*  CL 101 101 104 105  CO2  --  21* 19* 20*  GLUCOSE 211* 209* 117* 129*  BUN 21 20 25* 21  CREATININE 1.70* 1.62* 1.73* 1.57*  CALCIUM  --  8.6* 8.0* 8.0*    GFR: Estimated Creatinine Clearance: 44.4 mL/min (A) (by C-G formula based on SCr of 1.57 mg/dL (H)).  Liver Function Tests: Recent Labs  Lab 01/04/23 1246 01/07/23 1149  AST 12* 11*  ALT 12 10  ALKPHOS 51 40  BILITOT 0.7 0.6  PROT 6.2* 5.4*  ALBUMIN 2.9* 2.5*   No results for input(s): "LIPASE", "AMYLASE" in the last 168 hours. No results for input(s): "AMMONIA" in the last 168 hours.  Coagulation Profile: No results for input(s): "INR", "PROTIME" in the last 168 hours.  Cardiac Enzymes: No results for input(s): "CKTOTAL", "CKMB", "CKMBINDEX", "TROPONINI" in the last 168 hours.  BNP (last 3 results) No results for input(s): "PROBNP" in the last 8760 hours.  Lipid Profile: No results for input(s): "CHOL", "HDL", "LDLCALC", "TRIG", "CHOLHDL",  "LDLDIRECT" in the last 72 hours.  Thyroid Function Tests: Recent Labs    01/08/23 0519  TSH 2.579    Anemia Panel: No results for input(s): "VITAMINB12", "FOLATE", "FERRITIN", "TIBC", "IRON", "RETICCTPCT" in the last 72 hours.  Urine analysis:    Component Value Date/Time   COLORURINE YELLOW 01/17/2013 0623   APPEARANCEUR CLEAR 01/17/2013 0623   LABSPEC 1.018 01/17/2013 0623   PHURINE 5.0 01/17/2013 0623   GLUCOSEU 250 (A) 01/17/2013 0623   HGBUR NEGATIVE 01/17/2013 0623   BILIRUBINUR NEGATIVE 01/17/2013 0623   KETONESUR NEGATIVE 01/17/2013 0623   PROTEINUR NEGATIVE 01/17/2013 0623   UROBILINOGEN 0.2 01/17/2013 0623   NITRITE NEGATIVE 01/17/2013 0623   LEUKOCYTESUR NEGATIVE 01/17/2013 0623    Sepsis Labs: Lactic Acid, Venous    Component Value Date/Time   LATICACIDVEN 1.2 05/02/2020 0840  MICROBIOLOGY: No results found for this or any previous visit (from the past 240 hour(s)).  RADIOLOGY STUDIES/RESULTS: EEG adult  Result Date: 01/08/2023 Charlsie Quest, MD     01/08/2023  8:29 AM Patient Name: LEONCIO KERL MRN: [id number] Epilepsy Attending: Charlsie Quest Referring Physician/Provider: Clydie Braun, MD Date: 01/08/2023 Duration: 22.22 mins Patient history: 67 yo M presents after having a reported seizure witnessed by his daughter who states that he stiffened up prior to falling back on the bed with generalized tonic-clonic activity. EEG to evaluate for seizure Level of alertness: Awake AEDs during EEG study: LEV Technical aspects: This EEG study was done with scalp electrodes positioned according to the 10-20 International system of electrode placement. Electrical activity was reviewed with band pass filter of 1-70Hz , sensitivity of 7 uV/mm, display speed of 22mm/sec with a 60Hz  notched filter applied as appropriate. EEG data were recorded continuously and digitally stored.  Video monitoring was available and reviewed as appropriate. Description: The  posterior dominant rhythm consists of 8 Hz activity of moderate voltage (25-35 uV) seen predominantly in posterior head regions, symmetric and reactive to eye opening and eye closing. Hyperventilation and photic stimulation were not performed.   IMPRESSION: This study is within normal limits. No seizures or epileptiform discharges were seen throughout the recording. A normal interictal EEG does not exclude the diagnosis of epilepsy. Charlsie Quest   MR BRAIN WO CONTRAST  Result Date: 01/07/2023 CLINICAL DATA:  Seizure, new onset, no history of trauma EXAM: MRI HEAD WITHOUT CONTRAST TECHNIQUE: Multiplanar, multiecho pulse sequences of the brain and surrounding structures were obtained without intravenous contrast. COMPARISON:  01/17/2013 MRI head, correlation is made with 01/07/2023 CT head FINDINGS: Brain: The hippocampi are roughly symmetric in size and signal. No heterotopia or evidence of cortical dysgenesis. No restricted diffusion to suggest acute or subacute infarct. No acute hemorrhage, mass, mass effect, or midline shift. No hydrocephalus or extra-axial collection. Partial empty sella. Craniocervical junction within normal limits. Hemosiderin deposition is associated with encephalomalacia from a remote right MCA territory infarct, likely petechial hemorrhage. Additional punctate foci of hemosiderin deposition in the posterior right temporal lobe, likely sequela of prior hypertensive microhemorrhages, and left basal ganglia, likely related to remote infarct. Additional encephalomalacia in the left parieto-occipital region, with additional lacunar infarct in the left thalamus. Confluent T2 hyperintense signal in the periventricular white matter, likely the sequela of severe chronic small vessel ischemic disease. Vascular: Loss of the right vertebral artery flow void, which appears unchanged from the 2014 MRI. Otherwise normal arterial flow voids. Skull and upper cervical spine: Normal marrow signal.  Sinuses/Orbits: No mucosal thickening in the left maxillary sinus. No acute finding in the orbits. Other: Fluid in the right mastoid and middle ear. IMPRESSION: 1. No acute intracranial process. No seizure focus is identified. No evidence of acute or subacute infarct 2. Loss of the right vertebral artery flow void, which appears unchanged from the 2014 MRI, and is favored to be chronic. 3. Fluid in the right mastoid and middle ear. Correlate for otomastoiditis. Electronically Signed   By: Wiliam Ke M.D.   On: 01/07/2023 21:10   ECHOCARDIOGRAM COMPLETE  Result Date: 01/07/2023    ECHOCARDIOGRAM REPORT   Patient Name:   SAHAS BERRIEN Date of Exam: 01/07/2023 Medical Rec #:  [id number]         Height:       69.0 in Accession #:    [id number]        Weight:  162.7 lb Date of Birth:  07-Feb-1956          BSA:          1.892 m Patient Age:    74 years          BP:           125/92 mmHg Patient Gender: M                 HR:           108 bpm. Exam Location:  Inpatient Procedure: 2D Echo, Color Doppler and Cardiac Doppler Indications:    Pericardial Effusion  History:        Patient has prior history of Echocardiogram examinations, most                 recent 11/08/2021. CHF, Pericardial Disease, Stroke and Lung                 Cancer, Arrythmias:Atrial Fibrillation; Risk Factors:Diabetes                 and Dyslipidemia.  Sonographer:    Milbert Coulter Referring Phys: [id number] DAYNA N DUNN IMPRESSIONS  1. Left ventricular ejection fraction, by estimation, is 45 to 50%. The left ventricle has mildly decreased function. The left ventricle has no regional wall motion abnormalities. There is moderate left ventricular hypertrophy. Left ventricular diastolic parameters are consistent with Grade II diastolic dysfunction (pseudonormalization). Elevated left atrial pressure. The E/e' is 21.  2. Right ventricular systolic function is mildly reduced. The right ventricular size is normal.  3. Left atrial size was moderately  dilated.  4. Right atrial size was dilated.  5. The mitral valve is normal in structure. Trivial mitral valve regurgitation. No evidence of mitral stenosis.  6. The aortic valve is tricuspid. Aortic valve regurgitation is not visualized. Aortic valve sclerosis is present, with no evidence of aortic valve stenosis.  7. Aortic dilatation noted. There is dilatation of the ascending aorta, measuring 40 mm. Comparison(s): A prior study was performed on 11/08/2021. LVEF previously noted to be 55-60% and now 45-50%. See report for details. FINDINGS  Left Ventricle: Left ventricular ejection fraction, by estimation, is 45 to 50%. The left ventricle has mildly decreased function. The left ventricle has no regional wall motion abnormalities. The left ventricular internal cavity size was normal in size. There is moderate left ventricular hypertrophy. Left ventricular diastolic parameters are consistent with Grade II diastolic dysfunction (pseudonormalization). Elevated left atrial pressure. The E/e' is 21. Right Ventricle: The right ventricular size is normal. No increase in right ventricular wall thickness. Right ventricular systolic function is mildly reduced. Left Atrium: Left atrial size was moderately dilated. Right Atrium: Right atrial size was dilated. Pericardium: Trivial pericardial effusion is present. Mitral Valve: The mitral valve is normal in structure. Trivial mitral valve regurgitation. No evidence of mitral valve stenosis. Tricuspid Valve: The tricuspid valve is normal in structure. Tricuspid valve regurgitation is trivial. No evidence of tricuspid stenosis. Aortic Valve: The aortic valve is tricuspid. Aortic valve regurgitation is not visualized. Aortic valve sclerosis is present, with no evidence of aortic valve stenosis. Aortic valve mean gradient measures 2.0 mmHg. Aortic valve peak gradient measures 3.7  mmHg. Aortic valve area, by VTI measures 2.32 cm. Pulmonic Valve: The pulmonic valve was not well  visualized. Pulmonic valve regurgitation is not visualized. No evidence of pulmonic stenosis. Aorta: Aortic dilatation noted and the aortic root is normal in size and structure. There is dilatation of the  ascending aorta, measuring 40 mm. IAS/Shunts: The atrial septum is grossly normal. Additional Comments: There is pleural effusion in the left lateral region.  LEFT VENTRICLE PLAX 2D LVIDd:         3.50 cm   Diastology LVIDs:         2.70 cm   LV e' medial:    5.22 cm/s LV PW:         1.40 cm   LV E/e' medial:  24.7 LV IVS:        1.40 cm   LV e' lateral:   7.72 cm/s LVOT diam:     2.10 cm   LV E/e' lateral: 16.7 LV SV:         38 LV SV Index:   20 LVOT Area:     3.46 cm  RIGHT VENTRICLE RV Basal diam:  3.40 cm RV Mid diam:    2.60 cm RV S prime:     6.74 cm/s TAPSE (M-mode): 1.2 cm LEFT ATRIUM             Index        RIGHT ATRIUM           Index LA diam:        4.10 cm 2.17 cm/m   RA Area:     21.70 cm LA Vol (A2C):   80.0 ml 42.27 ml/m  RA Volume:   60.30 ml  31.86 ml/m LA Vol (A4C):   81.3 ml 42.96 ml/m LA Biplane Vol: 82.3 ml 43.49 ml/m  AORTIC VALVE AV Area (Vmax):    2.38 cm AV Area (Vmean):   2.37 cm AV Area (VTI):     2.32 cm AV Vmax:           96.20 cm/s AV Vmean:          64.200 cm/s AV VTI:            0.166 m AV Peak Grad:      3.7 mmHg AV Mean Grad:      2.0 mmHg LVOT Vmax:         66.00 cm/s LVOT Vmean:        44.000 cm/s LVOT VTI:          0.111 m LVOT/AV VTI ratio: 0.67  AORTA Ao Root diam: 3.10 cm Ao Asc diam:  4.00 cm MITRAL VALVE MV Area (PHT): 4.29 cm     SHUNTS MV Decel Time: 177 msec     Systemic VTI:  0.11 m MV E velocity: 129.00 cm/s  Systemic Diam: 2.10 cm MV A velocity: 46.10 cm/s MV E/A ratio:  2.80 Sunit Tolia Electronically signed by Tessa Lerner Signature Date/Time: 01/07/2023/5:11:42 PM    Final    DG Lumbar Spine Complete  Result Date: 01/07/2023 CLINICAL DATA:  Pain after fall EXAM: LUMBAR SPINE - COMPLETE 5 VIEW COMPARISON:  None Available. FINDINGS: Five lumbar-type  vertebral bodies. Preserved vertebral body heights and alignment. No listhesis. Prominent lower lumbar facet degenerative changes. Multilevel disc height loss, most severe at L2-3 but moderate L4-5 and L5-S1. Diffuse scattered endplate osteophytes as well. No spondylolysis. Prominent vascular calcifications. Overlapping cardiac leads. Recommend continue precautions until clinical clearance and if there is further concern of injury additional workup with CT as clinically appropriate for further sensitivity. IMPRESSION: Moderate degenerative changes. Electronically Signed   By: Karen Kays M.D.   On: 01/07/2023 14:19   DG Chest Port 1 View  Result Date: 01/07/2023 CLINICAL DATA:  Pain after  fall. EXAM: PORTABLE CHEST 1 VIEW COMPARISON:  X-ray 01/04/2023. FINDINGS: Enlarged cardiopericardial silhouette. Slightly increased left retrocardiac opacity with small effusion. No pneumothorax or edema. No right-sided consolidation. Calcified aorta. Overlapping cardiac leads. IMPRESSION: Slightly increased left retrocardiac opacity and effusion. Recommend continued follow up Electronically Signed   By: Karen Kays M.D.   On: 01/07/2023 14:18   DG Hip Unilat W or Wo Pelvis 2-3 Views Left  Result Date: 01/07/2023 CLINICAL DATA:  Pain after fall EXAM: DG HIP (WITH OR WITHOUT PELVIS) 4V LEFT COMPARISON:  None Available. FINDINGS: No fracture or dislocation. Preserved joint spaces and bone mineralization. Hyperostosis. Diffuse vascular calcifications. IMPRESSION: No acute osseous abnormality. Electronically Signed   By: Karen Kays M.D.   On: 01/07/2023 14:16   CT Head Wo Contrast  Result Date: 01/07/2023 CLINICAL DATA:  Head trauma, minor EXAM: CT HEAD WITHOUT CONTRAST TECHNIQUE: Contiguous axial images were obtained from the base of the skull through the vertex without intravenous contrast. RADIATION DOSE REDUCTION: This exam was performed according to the departmental dose-optimization program which includes  automated exposure control, adjustment of the mA and/or kV according to patient size and/or use of iterative reconstruction technique. COMPARISON:  01/04/2023 FINDINGS: Brain: No change since the prior examination. No focal abnormality seen affecting the brainstem or cerebellum. Cerebral hemispheres show old infarctions in the parietooccipital regions, more extensive on the right than the left, with volume loss encephalomalacia. Chronic small-vessel ischemic changes are present otherwise within the cerebral hemispheric white matter. Old basal ganglia infarctions on the left. No sign of acute stroke, mass, hemorrhage, hydrocephalus or extra-axial collection. Vascular: There is atherosclerotic calcification of the major vessels at the base of the brain. Skull: Negative Sinuses/Orbits: Clear/normal Other: None IMPRESSION: No acute or traumatic finding. 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 Chronic small-vessel ischemic changes otherwise within the cerebral hemispheric white matter. Electronically Signed   By: Paulina Fusi M.D.   On: 01/07/2023 13:22     LOS: 0 days   Jeoffrey Massed, MD  [facility]    To contact the attending provider between 7A-7P or the covering provider during after hours 7P-7A, please log into the web site [email or web address] and access using universal [place] password for that web site. If you do not have the password, please call the hospital operator.  01/09/2023, 9:40 AM

## 2023-01-09 NOTE — Progress Notes (Signed)
Patient Name: Brian [name] Date of Encounter: 01/09/2023 [facility] Cardiologist: Rollene Rotunda, MD   Interval Summary  .    Feeling well.  Denies palpitations or shortness of breath.   Vital Signs .    Vitals:   01/08/23 1940 01/09/23 0010 01/09/23 0800 01/09/23 0900  BP: 125/85 108/75 (!) 123/95 (!) 127/94  Pulse: 86  (!) 106 (!) 101  Resp:   20 20  Temp: 97.6 F (36.4 C) 97.7 F (36.5 C) 97.8 F (36.6 C) 97.8 F (36.6 C)  TempSrc: Oral Oral Oral   SpO2: 98% 95% 95% 96%  Weight:      Height:        Intake/Output Summary (Last 24 hours) at 01/09/2023 1004 Last data filed at 01/09/2023 0500 Gross per 24 hour  Intake --  Output 525 ml  Net -525 ml      01/07/2023   11:34 AM 01/04/2023   12:37 PM 12/14/2022    1:22 PM  Last 3 Weights  Weight (lbs) 162 lb 11.2 oz 162 lb 11.2 oz 162 lb 12.8 oz  Weight (kg) 73.8 kg 73.8 kg 73.846 kg      Telemetry/ECG    Atrial fibrillation.  Rate 100s-120s - Personally Reviewed  Physical Exam .    BP (!) 127/94   Pulse (!) 101   Temp 97.8 F (36.6 C)   Resp 20   Ht 5\' 9"  (1.753 m)   Wt 73.8 kg   SpO2 96%   BMI 24.03 kg/m  GENERAL:  Chronically ill-appearing.  Frail.  HEENT: Pupils equal round and reactive, fundi not visualized, oral mucosa unremarkable NECK:  No jugular venous distention, waveform within normal limits, carotid upstroke brisk and symmetric, no bruits, no thyromegaly LUNGS:  Clear to auscultation bilaterally HEART:  Tachycardic.  Irregularly irregular.  PMI not displaced or sustained,S1 and S2 within normal limits, no S3, no S4, no clicks, no rubs, no murmurs ABD:  Flat, positive bowel sounds normal in frequency in pitch, no bruits, no rebound, no guarding, no midline pulsatile mass, no hepatomegaly, no splenomegaly EXT:  2 plus pulses throughout, no edema, no cyanosis no clubbing SKIN:  No rashes no nodules NEURO:  Cranial nerves II through XII grossly intact, motor grossly intact  throughout PSYCH:  Cognitively intact, oriented to person place and time    Assessment & Plan .     67 y.o. male with a hx of chronic systolic diastolic heart failure, CVA, hypertension, PAF, diabetes, and hyperlipidemia admitted for seizure.  Cardiology consulted for the evaluation of pericardial effusion   # Chronic systolic and diastolic HF:  # Hypertension:  LVEF this admission 45-50%.  I have reviewed this echo and his echo 10/2021.  I don't see any significant difference in systolic function.  LVEF has been as low as 25-30% and subsequently recovered.  Continue guideline directed medical therapy with Jardiance, losartan, and hydralazine.  Blood pressure is well-controlled.  He has no heart failure symptoms and is euvolemic. Switched carvedilol to metoprolol given poorly controlled heart rates.  Will reduce his losartan to more blood pressure room for heart rate control.  # Pericardial effusion:  Trivial pericardial effusion without evidence of tamponade.  There is no evidence of uremia.  Will check TSH.   # PAF:  Maintain K>4, Mg >2.  Continue Eliquis and diltiazem.  Switched carvedilol to metoprolol succinate 200 mg daily.  Heart rate are still in the 100s to 110s.  Reducing losartan and will  increase diltiazem to 360 mg daily.  # CKD 3b:  Renal function stable.   # Seizure:  Management per primary team   For questions or updates, please contact [facility] Please consult [email or web address] for contact info under        Signed, Chilton Si, MD

## 2023-01-10 DIAGNOSIS — Z7984 Long term (current) use of oral hypoglycemic drugs: Secondary | ICD-10-CM | POA: Diagnosis not present

## 2023-01-10 DIAGNOSIS — E1122 Type 2 diabetes mellitus with diabetic chronic kidney disease: Secondary | ICD-10-CM | POA: Diagnosis not present

## 2023-01-10 DIAGNOSIS — I10 Essential (primary) hypertension: Secondary | ICD-10-CM | POA: Diagnosis not present

## 2023-01-10 DIAGNOSIS — I130 Hypertensive heart and chronic kidney disease with heart failure and stage 1 through stage 4 chronic kidney disease, or unspecified chronic kidney disease: Secondary | ICD-10-CM | POA: Diagnosis not present

## 2023-01-10 DIAGNOSIS — G4089 Other seizures: Secondary | ICD-10-CM | POA: Diagnosis not present

## 2023-01-10 DIAGNOSIS — R569 Unspecified convulsions: Secondary | ICD-10-CM | POA: Diagnosis not present

## 2023-01-10 DIAGNOSIS — Z7401 Bed confinement status: Secondary | ICD-10-CM | POA: Diagnosis not present

## 2023-01-10 DIAGNOSIS — G40909 Epilepsy, unspecified, not intractable, without status epilepticus: Secondary | ICD-10-CM | POA: Diagnosis not present

## 2023-01-10 DIAGNOSIS — E1169 Type 2 diabetes mellitus with other specified complication: Secondary | ICD-10-CM | POA: Diagnosis not present

## 2023-01-10 DIAGNOSIS — R2689 Other abnormalities of gait and mobility: Secondary | ICD-10-CM | POA: Diagnosis not present

## 2023-01-10 DIAGNOSIS — R1310 Dysphagia, unspecified: Secondary | ICD-10-CM | POA: Diagnosis not present

## 2023-01-10 DIAGNOSIS — R1312 Dysphagia, oropharyngeal phase: Secondary | ICD-10-CM | POA: Diagnosis not present

## 2023-01-10 DIAGNOSIS — N183 Chronic kidney disease, stage 3 unspecified: Secondary | ICD-10-CM | POA: Diagnosis not present

## 2023-01-10 DIAGNOSIS — R488 Other symbolic dysfunctions: Secondary | ICD-10-CM | POA: Diagnosis not present

## 2023-01-10 DIAGNOSIS — I509 Heart failure, unspecified: Secondary | ICD-10-CM | POA: Diagnosis not present

## 2023-01-10 DIAGNOSIS — R296 Repeated falls: Secondary | ICD-10-CM | POA: Diagnosis not present

## 2023-01-10 DIAGNOSIS — M6281 Muscle weakness (generalized): Secondary | ICD-10-CM | POA: Diagnosis not present

## 2023-01-10 DIAGNOSIS — I4891 Unspecified atrial fibrillation: Secondary | ICD-10-CM | POA: Diagnosis not present

## 2023-01-10 DIAGNOSIS — I3139 Other pericardial effusion (noninflammatory): Secondary | ICD-10-CM | POA: Diagnosis not present

## 2023-01-10 DIAGNOSIS — R262 Difficulty in walking, not elsewhere classified: Secondary | ICD-10-CM | POA: Diagnosis not present

## 2023-01-10 DIAGNOSIS — I4819 Other persistent atrial fibrillation: Secondary | ICD-10-CM

## 2023-01-10 DIAGNOSIS — Z741 Need for assistance with personal care: Secondary | ICD-10-CM | POA: Diagnosis not present

## 2023-01-10 DIAGNOSIS — I69354 Hemiplegia and hemiparesis following cerebral infarction affecting left non-dominant side: Secondary | ICD-10-CM | POA: Diagnosis not present

## 2023-01-10 DIAGNOSIS — I129 Hypertensive chronic kidney disease with stage 1 through stage 4 chronic kidney disease, or unspecified chronic kidney disease: Secondary | ICD-10-CM | POA: Diagnosis not present

## 2023-01-10 DIAGNOSIS — N1832 Chronic kidney disease, stage 3b: Secondary | ICD-10-CM | POA: Diagnosis not present

## 2023-01-10 DIAGNOSIS — R269 Unspecified abnormalities of gait and mobility: Secondary | ICD-10-CM | POA: Diagnosis not present

## 2023-01-10 DIAGNOSIS — R278 Other lack of coordination: Secondary | ICD-10-CM | POA: Diagnosis not present

## 2023-01-10 DIAGNOSIS — I6930 Unspecified sequelae of cerebral infarction: Secondary | ICD-10-CM | POA: Diagnosis not present

## 2023-01-10 DIAGNOSIS — R41841 Cognitive communication deficit: Secondary | ICD-10-CM | POA: Diagnosis not present

## 2023-01-10 LAB — GLUCOSE, CAPILLARY
Glucose-Capillary: 148 mg/dL — ABNORMAL HIGH (ref 70–99)
Glucose-Capillary: 166 mg/dL — ABNORMAL HIGH (ref 70–99)
Glucose-Capillary: 194 mg/dL — ABNORMAL HIGH (ref 70–99)

## 2023-01-10 MED ORDER — METOPROLOL SUCCINATE ER 200 MG PO TB24: 200.0000 mg | ORAL_TABLET | Freq: Every day | ORAL | Status: DC

## 2023-01-10 MED ORDER — LOSARTAN POTASSIUM 100 MG PO TABS: 50.0000 mg | ORAL_TABLET | Freq: Every day | ORAL | Status: DC

## 2023-01-10 MED ORDER — LEVETIRACETAM 500 MG PO TABS: 500.0000 mg | ORAL_TABLET | Freq: Two times a day (BID) | ORAL | Status: DC

## 2023-01-10 MED ORDER — DILTIAZEM HCL ER COATED BEADS 360 MG PO CP24: 360.0000 mg | ORAL_CAPSULE | Freq: Every day | ORAL | Status: DC

## 2023-01-10 MED ORDER — NOVOLOG FLEXPEN 100 UNIT/ML ~~LOC~~ SOPN: PEN_INJECTOR | SUBCUTANEOUS | Status: DC

## 2023-01-10 NOTE — Plan of Care (Signed)
Pt A&O*4. Pt can ambulate in room with assistance. Pt is not c/o pain or discomfort. Gave report to rehab. Pt is ready to be discharged. IV is taken out.   Problem: Education: Goal: Knowledge of General Education information will improve Description: Including pain rating scale, medication(s)/side effects and non-pharmacologic comfort measures Outcome: Completed/Met   Problem: Health Behavior/Discharge Planning: Goal: Ability to manage health-related needs will improve Outcome: Completed/Met   Problem: Clinical Measurements: Goal: Ability to maintain clinical measurements within normal limits will improve Outcome: Completed/Met Goal: Will remain free from infection Outcome: Completed/Met Goal: Diagnostic test results will improve Outcome: Completed/Met Goal: Respiratory complications will improve Outcome: Completed/Met Goal: Cardiovascular complication will be avoided Outcome: Completed/Met   Problem: Activity: Goal: Risk for activity intolerance will decrease Outcome: Completed/Met   Problem: Nutrition: Goal: Adequate nutrition will be maintained Outcome: Completed/Met   Problem: Coping: Goal: Level of anxiety will decrease Outcome: Completed/Met   Problem: Elimination: Goal: Will not experience complications related to bowel motility Outcome: Completed/Met Goal: Will not experience complications related to urinary retention Outcome: Completed/Met   Problem: Pain Management: Goal: General experience of comfort will improve Outcome: Completed/Met   Problem: Safety: Goal: Ability to remain free from injury will improve Outcome: Completed/Met   Problem: Skin Integrity: Goal: Risk for impaired skin integrity will decrease Outcome: Completed/Met
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
## 2023-01-10 NOTE — TOC Transition Note (Signed)
Transition of Care [facility]) - CM/SW Discharge Note   Patient Details  Name: Brian [name] MRN: [id number] Date of Birth: 08/20/1955  Transition of Care [facility]) CM/SW Contact:  Mearl Latin, [name] Phone Number: 01/10/2023, 2:27 PM   Clinical Narrative:    Patient will DC to: [facility] Anticipated DC date: 01/10/23 Family notified: Daughter, [name] Transport by: Sharin Mons   Per MD patient ready for DC to [place]. RN to call report prior to discharge [phone] room 906a). RN, patient, patient's family, and facility notified of DC. Discharge Summary and FL2 sent to facility. DC packet on chart. Ambulance transport requested for patient.   CSW will sign off for now as social work intervention is no longer needed. Please consult [name] again if new needs arise.     Final next level of care: Skilled Nursing Facility Barriers to Discharge: Barriers Resolved   Patient Goals and CMS Choice CMS Medicare.gov Compare Post Acute Care list provided to:: Patient Choice offered to / list presented to : Patient  Discharge Placement     Existing PASRR number confirmed : 01/10/23          Patient [facility] bed [facility]: [facility] Patient to be transferred to facility by: [facility] Name of family member notified: Daughter Patient and family notified of of transfer: 01/10/23  Discharge Plan and Services Additional resources added to the After Visit Summary for   In-house Referral: Clinical Social Work   Post Acute Care Choice: Skilled Nursing Facility                               Social Determinants of Health (SDOH) Interventions SDOH Screenings   Food Insecurity: No Food Insecurity (01/07/2023)  Housing: Low Risk  (01/07/2023)  Transportation Needs: No Transportation Needs (01/07/2023)  Utilities: Not At Risk (01/07/2023)  Alcohol Screen: Low Risk  (11/14/2021)  Depression (PHQ2-9): Low Risk  (04/05/2022)  Financial Resource Strain: Low Risk  (04/05/2022)  Physical Activity:  Inactive (04/05/2022)  Social Connections: Socially Isolated (04/05/2022)  Stress: No Stress Concern Present (04/05/2022)  Tobacco Use: Low Risk  (01/07/2023)     Readmission Risk Interventions    11/08/2021    9:46 AM  Readmission Risk Prevention Plan  Transportation Screening Complete  PCP or Specialist Appt within 3-5 Days Complete  HRI or Home Care Consult Complete  Social Work Consult for Recovery Care Planning/Counseling Complete  Palliative Care Screening Not Applicable  Medication Review Oceanographer) Complete

## 2023-01-10 NOTE — Progress Notes (Signed)
Physical Therapy Treatment Patient Details Name: Brian [name] MRN: [id number] DOB: 06-25-1955 Today's Date: 01/10/2023   History of Present Illness Pt is a 67 y.o. male admitted 11/18 for seizure. Of note, he presented to the ED 11/15 following a fall where he struck his head and was discharged home same day. 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 Pt reporting LLE pain that began after fall and limits activity tolerance this session. Pt able to stand and tolerate short gait distance around the bed with up to mod A. Pt demonstrates difficulty following cues for improved gait kinematics, however has no overt LOB. Pt requires seated rest break due to fatigue and declines further standing or gait trials. Pt attempts to scoot towards HOB, but is unable to elevate hips. Pt demonstrates improved bed mobility with up to CGA. Pt continues to benefit from PT services to progress toward functional mobility goals.     If plan is discharge home, recommend the following: A lot of help with walking and/or transfers;A lot of help with bathing/dressing/bathroom;Assistance with cooking/housework;Assist for transportation;Help with stairs or ramp for entrance   Can travel by private vehicle     Yes  Equipment Recommendations  Rolling walker (2 wheels)    Recommendations for Other Services       Precautions / Restrictions Precautions Precautions: Fall Restrictions Weight Bearing Restrictions: No     Mobility  Bed Mobility Overal bed mobility: Needs Assistance Bed Mobility: Sit to Supine     Supine to sit: Contact guard, Used rails     General bed mobility comments: cues for technique and increased time    Transfers Overall transfer level: Needs assistance Equipment used: Rolling walker (2  wheels) Transfers: Sit to/from Stand Sit to Stand: Mod assist           General transfer comment: from recliner with cues for hand placement and mod A for power up    Ambulation/Gait Ambulation/Gait assistance: Contact guard assist Gait Distance (Feet): 10 Feet Assistive device: Rolling walker (2 wheels) Gait Pattern/deviations: Step-to pattern, Decreased weight shift to left, Decreased step length - right, Trunk flexed Gait velocity: decreased     General Gait Details: Limited LLE WB tolerance due to pain and flexed trunk despite cues. Cues for RW proximity and upright posture. Attempted lateral scoots towards HOB, but pt unable despite cues for technique and min A       Balance Overall balance assessment: Needs assistance Sitting-balance support: Feet supported, Bilateral upper extremity supported Sitting balance-Leahy Scale: Fair     Standing balance support: Bilateral upper extremity supported, During functional activity, Reliant on assistive device for balance Standing balance-Leahy Scale: Poor Standing balance comment: with RW support                            Cognition Arousal: Alert Behavior During Therapy: WFL for tasks assessed/performed Overall Cognitive Status: Within Functional Limits for tasks assessed                                          Exercises      General Comments        Pertinent  Vitals/Pain Pain Assessment Pain Assessment: Faces Faces Pain Scale: Hurts even more Pain Location: LLE Pain Descriptors / Indicators: Guarding, Tender, Discomfort Pain Intervention(s): Monitored during session, Limited activity within patient's tolerance, Repositioned     PT Goals (current goals can now be found in the care plan section) Acute Rehab PT Goals Patient Stated Goal: get stronger PT Goal Formulation: With patient Time For Goal Achievement: 01/22/23 Progress towards PT goals: Progressing toward goals     Frequency    Min 1X/week       AM-PAC PT "6 Clicks" Mobility   Outcome Measure  Help needed turning from your back to your side while in a flat bed without using bedrails?: A Little Help needed moving from lying on your back to sitting on the side of a flat bed without using bedrails?: A Little Help needed moving to and from a bed to a chair (including a wheelchair)?: A Little Help needed standing up from a chair using your arms (e.g., wheelchair or bedside chair)?: A Lot Help needed to walk in hospital room?: A Lot Help needed climbing 3-5 steps with a railing? : Total 6 Click Score: 14    End of Session Equipment Utilized During Treatment: Gait belt Activity Tolerance: Patient limited by fatigue;Patient limited by pain Patient left: in bed;with call bell/phone within reach;with bed alarm set Nurse Communication: Mobility status PT Visit Diagnosis: Unsteadiness on feet (R26.81);Difficulty in walking, not elsewhere classified (R26.2);Pain     Time: 4696-2952 PT Time Calculation (min) (ACUTE ONLY): 36 min  Charges:    $Gait Training: 8-22 mins $Therapeutic Activity: 8-22 mins PT General Charges $$ ACUTE PT VISIT: 1 Visit                     Johny Shock, PTA Acute Rehabilitation Services Secure Chat Preferred  [phone]    Johny Shock 01/10/2023, 12:11 PM

## 2023-01-10 NOTE — Discharge Summary (Addendum)
PATIENT DETAILS Name: Brian [name] Age: 67 y.o. Sex: male Date of Birth: [birth year] MRN: [id number]. Admitting Physician: Dorcas Carrow, MD QMV:[id number], Priscille Heidelberg, MD  Admit Date: 01/07/2023 Discharge date: 01/10/2023  Recommendations for Outpatient Follow-up:  Follow up with PCP in 1-2 weeks Please obtain CMP/CBC in one week Please ensure follow-up with outpatient neurology  Admitted From:  Home  Disposition: Skilled nursing facility   Discharge Condition: good  CODE STATUS:   Code Status: Full Code   Diet recommendation:  Diet Order             Diet - low sodium heart healthy           Diet heart healthy/carb modified Room service appropriate? Yes; Fluid consistency: Thin  Diet effective now                    Brief Summary: Patient is a 66 y.o.  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-presented to the hospital following a breakthrough seizure (No longer on antiepileptics)   Significant events: 11/18>> admit to [facility]   Significant studies: 11/18>> MRI brain: No acute intracranial process 11/18>> x-ray left hip: No fracture 11/18>> x-ray LS spine: Degenerative changes 11/18>> TTE: EF 45-50%, grade 2 diastolic dysfunction, RV systolic function mildly reduced 11/19>> EEG: No seizures   Significant microbiology data: None   Procedures: None   Consults: Cards  Brief Hospital Course: Breakthrough seizure  Breakthrough seizure secondary to noncompliance to antiepileptics-Keppra was discontinued several years back MRI brain/EEG stable Admitted and started back on Keppra-no further seizures.   Needs outpatient follow-up with neurology Patient aware of seizure precautions/driving restrictions.  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   Weight as of this encounter: 73.8 kg.   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   Hypokalemia   Pancytopenia (HCC)   Seizure (HCC)   Discharge Instructions:  Activity:  As tolerated with Full fall precautions use walker/cane & assistance as needed  Discharge Instructions     Ambulatory referral to Neurology   Complete by: As directed    An appointment is requested in approximately: 4 weeks   Call MD for:  persistant dizziness or light-headedness   Complete by: As directed    Diet - low sodium heart healthy   Complete  by: As directed    Discharge instructions   Complete by: As directed    Follow with Primary MD  [name], MD in 1-2 weeks  Please get a complete blood count and chemistry panel checked by your Primary MD at  your next visit, and again as instructed by your Primary MD.  Get Medicines reviewed and adjusted: Please take all your medications with you for your next visit with your Primary MD  Laboratory/radiological data: Please request your Primary MD to go over all hospital tests and procedure/radiological results at the follow up, please ask your Primary MD to get all Hospital records sent to his/her office.  In some cases, they will be blood work, cultures and biopsy results pending at the time of your discharge. Please request that your primary care M.D. follows up on these results.  Also Note the following: If you experience worsening of your admission symptoms, develop shortness of breath, life threatening emergency, suicidal or homicidal thoughts you must seek medical attention immediately by calling 911 or calling your MD immediately  if symptoms less severe.  You must read complete instructions/literature along with all the possible adverse reactions/side effects for all the Medicines you take and that have been prescribed to you. Take any new Medicines after you have completely understood and accpet all the possible adverse reactions/side effects.   Do not drive when taking Pain medications or sleeping medications (Benzodaizepines)  Do not take more than prescribed Pain, Sleep and Anxiety Medications. It is not advisable to combine anxiety,sleep and pain medications without talking with your primary care practitioner  Special Instructions: If you have smoked or chewed Tobacco  in the last 2 yrs please stop smoking, stop any regular Alcohol  and or any Recreational drug use.  Wear Seat belts while driving.  Please note: You were cared for by a hospitalist during your hospital stay. Once you are discharged, your primary care physician will handle any further medical issues. Please note that NO REFILLS for any discharge medications will be authorized once you are discharged, as it is  imperative that you return to your primary care physician (or establish a relationship with a primary care physician if you do not have one) for your post hospital discharge needs so that they can reassess your need for medications and monitor your lab values.     Seizure precautions: Per [facility] statutes, patients with seizures are not allowed to drive until they have been seizure-free for six months and cleared by a physician    Use caution when using heavy equipment or power tools. Avoid working on ladders or at heights. Take showers instead of baths. Ensure the water temperature is not too high on the home water heater. Do not go swimming alone. Do not lock yourself in a room alone (i.e. bathroom). When caring for infants or small children, sit down when holding, feeding, or changing them to minimize risk of injury to the child in the event you have a seizure. Maintain good sleep hygiene. Avoid alcohol.    If patient has another seizure, call 911 and bring them back to the ED if: A.  The seizure lasts longer than 5 minutes.      B.  The patient doesn't wake shortly after the seizure or has new problems such as difficulty seeing, speaking or moving following the seizure C.  The patient was injured during the seizure D.  The patient has a temperature over 102 F (39C) E.  The patient vomited during the seizure and now is having trouble breathing    During the Seizure   - First, ensure adequate ventilation and place patients on the floor on their left side  Loosen clothing around the neck and ensure the airway is patent. If the patient is clenching the teeth, do not force the mouth open with any object as this can cause severe damage - Remove all items from the surrounding that can be hazardous. The patient may be oblivious to what's happening and may not even know what he or she is doing. If the patient is confused and wandering, either gently guide him/her away and block access to  outside areas - Reassure the individual and be comforting - Call 911. In most cases, the seizure ends before EMS arrives. However, there are cases when seizures may last over 3 to 5 minutes. Or the individual may have developed breathing difficulties or severe injuries. If a pregnant patient or a person with diabetes develops a seizure, it is prudent to call an ambulance. - Finally, if the patient does not regain full consciousness, then call EMS. Most patients will remain confused for about 45 to 90 minutes after a seizure, so you must use judgment in calling for help. - Avoid restraints but make sure the patient is in a bed with padded side rails - Place the individual in a lateral position with the neck slightly flexed; this will help the saliva drain from the mouth and prevent the tongue from falling backward - Remove all nearby furniture and other hazards from the area - Provide verbal assurance as the individual is regaining consciousness - Provide the patient with privacy if possible - Call for help and start treatment as ordered by the caregiver    After the Seizure (Postictal Stage)   After a seizure, most patients experience confusion, fatigue, muscle pain and/or a headache. Thus, one should permit the individual to sleep. For the next few days, reassurance is essential. Being calm and helping reorient the person is also of importance.   Most seizures are painless and end spontaneously. Seizures are not harmful to others but can lead to complications such as stress on the lungs, brain and the heart. Individuals with prior lung problems may develop labored breathing and respiratory distress.    Increase activity slowly   Complete by: As directed       Allergies as of 01/10/2023       Reactions   Codeine Other (See Comments)   Unknown reaction, severe headach   Metformin And Related Diarrhea        Medication List     STOP taking these medications    carvedilol 25 MG  tablet Commonly known as: COREG   pioglitazone 30 MG tablet Commonly known as: ACTOS       TAKE these medications    apixaban 5 MG Tabs tablet Commonly known as: Eliquis Take 1 tablet (5 mg total) by mouth 2 (two) times daily.   atorvastatin 20 MG tablet Commonly known as: LIPITOR Take 1 tablet (20 mg total) by mouth daily.   diltiazem 360 MG 24 hr capsule Commonly known as: CARDIZEM CD Take 1 capsule (360 mg total) by mouth daily. What changed:  medication strength how much to take Another medication with the same name was removed. Continue taking this medication, and follow the directions you see here.   empagliflozin 25 MG Tabs tablet Commonly known as: Jardiance Take 1 tablet (25 mg total) by  mouth daily before breakfast.   FreeStyle Libre 2 Sensor Misc Use as directed to monitor blood glucose continuously. Change sensor Q 14 days. DX E11.65   furosemide 40 MG tablet Commonly known as: LASIX Take 40 mg by mouth as needed for fluid or edema.   glucose blood test strip 1 each by Other route as needed for other. Use as instructed   hydrALAZINE 50 MG tablet Commonly known as: APRESOLINE Take 1 tablet (50 mg total) by mouth 3 (three) times daily.   levETIRAcetam 500 MG tablet Commonly known as: KEPPRA Take 1 tablet (500 mg total) by mouth 2 (two) times daily.   losartan 100 MG tablet Commonly known as: COZAAR Take 0.5 tablets (50 mg total) by mouth daily. What changed: how much to take   MAGNESIUM PO Take 1 tablet by mouth daily.   metoprolol 200 MG 24 hr tablet Commonly known as: TOPROL-XL Take 1 tablet (200 mg total) by mouth daily. Take with or immediately following a meal.   NovoLOG FlexPen 100 UNIT/ML FlexPen Generic drug: insulin aspart 0-9 Units, Subcutaneous, 3 times daily with meals CBG < 70: Implement Hypoglycemia measures CBG 70 - 120: 0 units CBG 121 - 150: 1 unit CBG 151 - 200: 2 units CBG 201 - 250: 3 units CBG 251 - 300: 5 units CBG 301 -  350: 7 units CBG 351 - 400: 9 units CBG > 400: call MD        Contact information for follow-up providers     [name], MD. Schedule an appointment as soon as possible for a visit in 1 week(s).   Specialty: Family Medicine Contact information: [address] [place] [address] [phone]         [facility] Follow up.   Specialty: Neurology Why: Hospital follow up, Office will call with date/time, If you dont hear from them,please give them a call Contact information: [address] [place] [address] [phone]             Contact information for after-discharge care     Destination     [facility] Preferred SNF .   Service: Skilled Nursing Contact information: [address] [place] [address] [phone]                    Allergies  Allergen Reactions   Codeine Other (See Comments)    Unknown reaction, severe headach   Metformin And Related Diarrhea     Other Procedures/Studies: EEG adult  Result Date: 01-29-2023 Charlsie Quest, MD     29-Jan-2023  8:29 AM Patient Name: Brian [name] MRN: [id number] Epilepsy Attending: Charlsie Quest Referring Physician/Provider: Clydie Braun, MD Date: Jan 29, 2023 Duration: 22.22 mins Patient history: 67 yo M presents after having a reported seizure witnessed by his daughter who states that he stiffened up prior to falling back on the bed with generalized tonic-clonic activity. EEG to evaluate for seizure Level of alertness: Awake AEDs during EEG study: LEV Technical aspects: This EEG study was done with scalp electrodes positioned according to the 10-20 International system of electrode placement. Electrical activity was reviewed with band pass filter of 1-70Hz , sensitivity of 7 uV/mm, display speed of 13mm/sec with a 60Hz  notched filter applied as appropriate. EEG data were  recorded continuously and digitally stored.  Video monitoring was available and reviewed as appropriate. Description: The posterior dominant rhythm consists of 8 Hz  activity of moderate voltage (25-35 uV) seen predominantly in posterior head regions, symmetric and reactive to eye opening and eye closing. Hyperventilation and photic stimulation were not performed.   IMPRESSION: This study is within normal limits. No seizures or epileptiform discharges were seen throughout the recording. A normal interictal EEG does not exclude the diagnosis of epilepsy. Charlsie Quest   MR BRAIN WO CONTRAST  Result Date: 01/07/2023 CLINICAL DATA:  Seizure, new onset, no history of trauma EXAM: MRI HEAD WITHOUT CONTRAST TECHNIQUE: Multiplanar, multiecho pulse sequences of the brain and surrounding structures were obtained without intravenous contrast. COMPARISON:  01/17/2013 MRI head, correlation is made with 01/07/2023 CT head FINDINGS: Brain: The hippocampi are roughly symmetric in size and signal. No heterotopia or evidence of cortical dysgenesis. No restricted diffusion to suggest acute or subacute infarct. No acute hemorrhage, mass, mass effect, or midline shift. No hydrocephalus or extra-axial collection. Partial empty sella. Craniocervical junction within normal limits. Hemosiderin deposition is associated with encephalomalacia from a remote right MCA territory infarct, likely petechial hemorrhage. Additional punctate foci of hemosiderin deposition in the posterior right temporal lobe, likely sequela of prior hypertensive microhemorrhages, and left basal ganglia, likely related to remote infarct. Additional encephalomalacia in the left parieto-occipital region, with additional lacunar infarct in the left thalamus. Confluent T2 hyperintense signal in the periventricular white matter, likely the sequela of severe chronic small vessel ischemic disease. Vascular: Loss of the right vertebral artery flow void, which appears  unchanged from the 2014 MRI. Otherwise normal arterial flow voids. Skull and upper cervical spine: Normal marrow signal. Sinuses/Orbits: No mucosal thickening in the left maxillary sinus. No acute finding in the orbits. Other: Fluid in the right mastoid and middle ear. IMPRESSION: 1. No acute intracranial process. No seizure focus is identified. No evidence of acute or subacute infarct 2. Loss of the right vertebral artery flow void, which appears unchanged from the 2014 MRI, and is favored to be chronic. 3. Fluid in the right mastoid and middle ear. Correlate for otomastoiditis. Electronically Signed   By: Wiliam Ke M.D.   On: 01/07/2023 21:10   ECHOCARDIOGRAM COMPLETE  Result Date: 01/07/2023    ECHOCARDIOGRAM REPORT   Patient Name:   LENNIS CALDEIRA Date of Exam: 01/07/2023 Medical Rec #:  [id number]         Height:       69.0 in Accession #:    [id number]        Weight:       162.7 lb Date of Birth:  [birth year]          BSA:          1.892 m Patient Age:    67 years          BP:           125/92 mmHg Patient Gender: M                 HR:           108 bpm. Exam Location:  Inpatient Procedure: 2D Echo, Color Doppler and Cardiac Doppler Indications:    Pericardial Effusion  History:        Patient has prior history of Echocardiogram examinations, most                 recent 11/08/2021. CHF, Pericardial Disease, Stroke and Lung                 Cancer, Arrythmias:Atrial Fibrillation; Risk Factors:Diabetes  and Dyslipidemia.  Sonographer:    Milbert Coulter Referring Phys: [id number] DAYNA N DUNN IMPRESSIONS  1. Left ventricular ejection fraction, by estimation, is 45 to 50%. The left ventricle has mildly decreased function. The left ventricle has no regional wall motion abnormalities. There is moderate left ventricular hypertrophy. Left ventricular diastolic parameters are consistent with Grade II diastolic dysfunction (pseudonormalization). Elevated left atrial pressure. The E/e' is 21.  2. Right  ventricular systolic function is mildly reduced. The right ventricular size is normal.  3. Left atrial size was moderately dilated.  4. Right atrial size was dilated.  5. The mitral valve is normal in structure. Trivial mitral valve regurgitation. No evidence of mitral stenosis.  6. The aortic valve is tricuspid. Aortic valve regurgitation is not visualized. Aortic valve sclerosis is present, with no evidence of aortic valve stenosis.  7. Aortic dilatation noted. There is dilatation of the ascending aorta, measuring 40 mm. Comparison(s): A prior study was performed on 11/08/2021. LVEF previously noted to be 55-60% and now 45-50%. See report for details. FINDINGS  Left Ventricle: Left ventricular ejection fraction, by estimation, is 45 to 50%. The left ventricle has mildly decreased function. The left ventricle has no regional wall motion abnormalities. The left ventricular internal cavity size was normal in size. There is moderate left ventricular hypertrophy. Left ventricular diastolic parameters are consistent with Grade II diastolic dysfunction (pseudonormalization). Elevated left atrial pressure. The E/e' is 21. Right Ventricle: The right ventricular size is normal. No increase in right ventricular wall thickness. Right ventricular systolic function is mildly reduced. Left Atrium: Left atrial size was moderately dilated. Right Atrium: Right atrial size was dilated. Pericardium: Trivial pericardial effusion is present. Mitral Valve: The mitral valve is normal in structure. Trivial mitral valve regurgitation. No evidence of mitral valve stenosis. Tricuspid Valve: The tricuspid valve is normal in structure. Tricuspid valve regurgitation is trivial. No evidence of tricuspid stenosis. Aortic Valve: The aortic valve is tricuspid. Aortic valve regurgitation is not visualized. Aortic valve sclerosis is present, with no evidence of aortic valve stenosis. Aortic valve mean gradient measures 2.0 mmHg. Aortic valve peak  gradient measures 3.7  mmHg. Aortic valve area, by VTI measures 2.32 cm. Pulmonic Valve: The pulmonic valve was not well visualized. Pulmonic valve regurgitation is not visualized. No evidence of pulmonic stenosis. Aorta: Aortic dilatation noted and the aortic root is normal in size and structure. There is dilatation of the ascending aorta, measuring 40 mm. IAS/Shunts: The atrial septum is grossly normal. Additional Comments: There is pleural effusion in the left lateral region.  LEFT VENTRICLE PLAX 2D LVIDd:         3.50 cm   Diastology LVIDs:         2.70 cm   LV e' medial:    5.22 cm/s LV PW:         1.40 cm   LV E/e' medial:  24.7 LV IVS:        1.40 cm   LV e' lateral:   7.72 cm/s LVOT diam:     2.10 cm   LV E/e' lateral: 16.7 LV SV:         38 LV SV Index:   20 LVOT Area:     3.46 cm  RIGHT VENTRICLE RV Basal diam:  3.40 cm RV Mid diam:    2.60 cm RV S prime:     6.74 cm/s TAPSE (M-mode): 1.2 cm LEFT ATRIUM  Index        RIGHT ATRIUM           Index LA diam:        4.10 cm 2.17 cm/m   RA Area:     21.70 cm LA Vol (A2C):   80.0 ml 42.27 ml/m  RA Volume:   60.30 ml  31.86 ml/m LA Vol (A4C):   81.3 ml 42.96 ml/m LA Biplane Vol: 82.3 ml 43.49 ml/m  AORTIC VALVE AV Area (Vmax):    2.38 cm AV Area (Vmean):   2.37 cm AV Area (VTI):     2.32 cm AV Vmax:           96.20 cm/s AV Vmean:          64.200 cm/s AV VTI:            0.166 m AV Peak Grad:      3.7 mmHg AV Mean Grad:      2.0 mmHg LVOT Vmax:         66.00 cm/s LVOT Vmean:        44.000 cm/s LVOT VTI:          0.111 m LVOT/AV VTI ratio: 0.67  AORTA Ao Root diam: 3.10 cm Ao Asc diam:  4.00 cm MITRAL VALVE MV Area (PHT): 4.29 cm     SHUNTS MV Decel Time: 177 msec     Systemic VTI:  0.11 m MV E velocity: 129.00 cm/s  Systemic Diam: 2.10 cm MV A velocity: 46.10 cm/s MV E/A ratio:  2.80 Sunit Tolia Electronically signed by Tessa Lerner Signature Date/Time: 01/07/2023/5:11:42 PM    Final    DG Lumbar Spine Complete  Result Date:  01/07/2023 CLINICAL DATA:  Pain after fall EXAM: LUMBAR SPINE - COMPLETE 5 VIEW COMPARISON:  None Available. FINDINGS: Five lumbar-type vertebral bodies. Preserved vertebral body heights and alignment. No listhesis. Prominent lower lumbar facet degenerative changes. Multilevel disc height loss, most severe at L2-3 but moderate L4-5 and L5-S1. Diffuse scattered endplate osteophytes as well. No spondylolysis. Prominent vascular calcifications. Overlapping cardiac leads. Recommend continue precautions until clinical clearance and if there is further concern of injury additional workup with CT as clinically appropriate for further sensitivity. IMPRESSION: Moderate degenerative changes. Electronically Signed   By: Karen Kays M.D.   On: 01/07/2023 14:19   DG Chest Port 1 View  Result Date: 01/07/2023 CLINICAL DATA:  Pain after fall. EXAM: PORTABLE CHEST 1 VIEW COMPARISON:  X-ray 01/04/2023. FINDINGS: Enlarged cardiopericardial silhouette. Slightly increased left retrocardiac opacity with small effusion. No pneumothorax or edema. No right-sided consolidation. Calcified aorta. Overlapping cardiac leads. IMPRESSION: Slightly increased left retrocardiac opacity and effusion. Recommend continued follow up Electronically Signed   By: Karen Kays M.D.   On: 01/07/2023 14:18   DG Hip Unilat W or Wo Pelvis 2-3 Views Left  Result Date: 01/07/2023 CLINICAL DATA:  Pain after fall EXAM: DG HIP (WITH OR WITHOUT PELVIS) 4V LEFT COMPARISON:  None Available. FINDINGS: No fracture or dislocation. Preserved joint spaces and bone mineralization. Hyperostosis. Diffuse vascular calcifications. IMPRESSION: No acute osseous abnormality. Electronically Signed   By: Karen Kays M.D.   On: 01/07/2023 14:16   CT Head Wo Contrast  Result Date: 01/07/2023 CLINICAL DATA:  Head trauma, minor EXAM: CT HEAD WITHOUT CONTRAST TECHNIQUE: Contiguous axial images were obtained from the base of the skull through the vertex without  intravenous contrast. RADIATION DOSE REDUCTION: This exam was performed according to the departmental dose-optimization program which includes automated  exposure control, adjustment of the mA and/or kV according to patient size and/or use of iterative reconstruction technique. COMPARISON:  01/04/2023 FINDINGS: Brain: No change since the prior examination. No focal abnormality seen affecting the brainstem or cerebellum. Cerebral hemispheres show old infarctions in the parietooccipital regions, more extensive on the right than the left, with volume loss encephalomalacia. Chronic small-vessel ischemic changes are present otherwise within the cerebral hemispheric white matter. Old basal ganglia infarctions on the left. No sign of acute stroke, mass, hemorrhage, hydrocephalus or extra-axial collection. Vascular: There is atherosclerotic calcification of the major vessels at the base of the brain. Skull: Negative Sinuses/Orbits: Clear/normal Other: None IMPRESSION: No acute or traumatic finding. 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 Chronic small-vessel ischemic changes otherwise within the cerebral hemispheric white matter. Electronically Signed   By: Paulina Fusi M.D.   On: 01/07/2023 13:22   DG Pelvis 1-2 Views  Result Date: 01/04/2023 CLINICAL DATA:  Fall. EXAM: PELVIS - 1-2 VIEW COMPARISON:  None Available. FINDINGS: The cortical margins of the bony pelvis are intact. No fracture. Pubic symphysis and sacroiliac joints are congruent. Both femoral heads are well-seated in the respective acetabula. Mild bilateral hip osteoarthritis. IMPRESSION: 1. No pelvic fracture. 2. Mild bilateral hip osteoarthritis. Electronically Signed   By: Narda Rutherford M.D.   On: 01/04/2023 16:43   DG Elbow Complete Left  Result Date: 01/04/2023 CLINICAL DATA:  Lost balance leading to fall. EXAM: LEFT ELBOW - COMPLETE 3+ VIEW COMPARISON:   None Available. FINDINGS: No definite acute fracture. Corticated density adjacent to the lateral antecubital fossa has a chronic appearance. No elbow dislocation. Limited assessment for joint effusion due to positioning, no large joint effusion is seen. There are vascular calcifications. No radiopaque foreign body. IMPRESSION: 1. No definite acute fracture of the left elbow. 2. Corticated density adjacent to the lateral antecubital fossa has a chronic appearance. Electronically Signed   By: Narda Rutherford M.D.   On: 01/04/2023 16:42   DG Chest 1 View  Result Date: 01/04/2023 CLINICAL DATA:  Lost balance leading to fall at [place]. EXAM: CHEST  1 VIEW COMPARISON:  Chest radiograph 10/30/2022, CT 12/10/2022 FINDINGS: Stable heart size and mediastinal contours. Patient with known mediastinal adenopathy, not well delineated by radiograph. Left basilar opacity is unchanged from recent CT, combination of pleural effusion and lower lobe collapse. No pneumothorax or acute airspace disease. No displaced rib fracture. IMPRESSION: 1. No acute traumatic injury. 2. Unchanged left basilar opacity, combination of pleural effusion and lower lobe collapse. 3. Known mediastinal adenopathy is not well delineated by radiograph. Electronically Signed   By: Narda Rutherford M.D.   On: 01/04/2023 16:39   CT HEAD WO CONTRAST ( )  Result Date: 01/04/2023 CLINICAL DATA:  Head trauma, minor (Age >= 65y); Polytrauma, blunt EXAM: CT HEAD WITHOUT CONTRAST CT CERVICAL SPINE WITHOUT CONTRAST TECHNIQUE: Multidetector CT imaging of the head and cervical spine was performed following the standard protocol without intravenous contrast. Multiplanar CT image reconstructions of the cervical spine were also generated. RADIATION DOSE REDUCTION: This exam was performed according to the departmental dose-optimization program which includes automated exposure control, adjustment of the mA and/or kV according to patient size and/or use of  iterative reconstruction technique. 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 No evidence of acute hemorrhage, acute large vascular  territory infarct, mass lesion or midline shift. No hydrocephalus. Vascular: No hyperdense vessel. Skull: No acute fracture.  Left forehead contusion. Sinuses/Orbits: Clear sinuses.  No acute abnormalities. CT CERVICAL SPINE FINDINGS Alignment: Reversal of the normal cervical lordosis. Mild anterolisthesis of C7 on T1, likely degenerative. Skull base and vertebrae: No acute fracture. Soft tissues and spinal canal: No prevertebral fluid or swelling. No visible canal hematoma. Disc levels: Large to severe multilevel degenerative change including degenerative disc height loss, endplate spurring and facet/uncovertebral hypertrophy. Upper chest: Layering left pleural effusion. IMPRESSION: 1. No evidence of acute abnormality intracranially or in the cervical spine. 2. Left forehead contusion. 3. Remote infarcts. 4. Layering left pleural effusion. Electronically Signed   By: Feliberto Harts M.D.   On: 01/04/2023 13:49   CT CERVICAL SPINE WO CONTRAST  Result Date: 01/04/2023 CLINICAL DATA:  Head trauma, minor (Age >= 65y); Polytrauma, blunt EXAM: CT HEAD WITHOUT CONTRAST CT CERVICAL SPINE WITHOUT CONTRAST TECHNIQUE: Multidetector CT imaging of the head and cervical spine was performed following the standard protocol without intravenous contrast. Multiplanar CT image reconstructions of the cervical spine were also generated. RADIATION DOSE REDUCTION: This exam was performed according to the departmental dose-optimization program which includes automated exposure control, adjustment of the mA and/or kV according to patient size and/or use of iterative reconstruction technique. 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 No evidence of acute hemorrhage, acute large vascular territory infarct, mass lesion or midline shift. No hydrocephalus. Vascular: No hyperdense vessel. Skull: No acute fracture.  Left forehead contusion. Sinuses/Orbits: Clear sinuses.  No acute abnormalities. CT CERVICAL SPINE FINDINGS Alignment: Reversal of the normal cervical lordosis. Mild anterolisthesis of C7 on T1, likely degenerative. Skull base and vertebrae: No acute fracture. Soft tissues and spinal canal: No prevertebral fluid or swelling. No visible canal hematoma. Disc levels: Large to severe multilevel degenerative change including degenerative disc height loss, endplate spurring and facet/uncovertebral hypertrophy. Upper chest: Layering left pleural effusion. IMPRESSION: 1. No evidence of acute abnormality intracranially or in the cervical spine. 2. Left forehead contusion. 3. Remote infarcts. 4. Layering left pleural effusion. Electronically Signed   By: Feliberto Harts M.D.   On: 01/04/2023 13:49     TODAY-DAY OF DISCHARGE:  Subjective:   Brian [name] today has no headache,no chest abdominal pain,no new weakness tingling or numbness, feels much better wants to go home today.   Objective:   Blood pressure 103/76, pulse 98, temperature 98.2 F (36.8 C), temperature source Oral, resp. rate 17, height 5\' 9"  (1.753 m), weight 73.8 kg, SpO2 96%.  Intake/Output Summary (Last 24 hours) at 01/10/2023 0912 Last data filed at 01/09/2023 2000 Gross per 24 hour  Intake 480 ml  Output 400 ml  Net 80 ml   Filed Weights   01/07/23 1134  Weight: 73.8 kg    Exam: Awake Alert, Oriented *3, No new F.N deficits, Normal affect [place].AT,PERRAL Supple Neck,No JVD, No cervical lymphadenopathy appriciated.  Symmetrical Chest wall movement, Good air movement bilaterally,  CTAB RRR,No Gallops,Rubs or new Murmurs, No Parasternal Heave +ve B.Sounds, Abd Soft, Non tender, No organomegaly appriciated, No rebound -guarding or rigidity. No Cyanosis, Clubbing or edema, No new Rash or bruise   PERTINENT RADIOLOGIC STUDIES: No results found.   PERTINENT LAB RESULTS: CBC: Recent Labs    01/07/23 1149 01/08/23 0519  WBC 2.2* 2.1*  HGB 9.0* 8.9*  HCT  28.0* 27.6*  PLT 184 173   CMET CMP     Component Value Date/Time   NA 132 (L) 01/08/2023 0519   NA 139 09/01/2021 1014   K 3.4 (L) 01/08/2023 0519   CL 105 01/08/2023 0519   CO2 20 (L) 01/08/2023 0519   GLUCOSE 129 (H) 01/08/2023 0519   BUN 21 01/08/2023 0519   BUN 17 09/01/2021 1014   CREATININE 1.57 (H) 01/08/2023 0519   CREATININE 1.64 (H) 01/01/2023 1020   CREATININE 1.70 (H) 11/29/2022 1423   CALCIUM 8.0 (L) 01/08/2023 0519   PROT 5.4 (L) 01/07/2023 1149   ALBUMIN 2.5 (L) 01/07/2023 1149   AST 11 (L) 01/07/2023 1149   AST 14 (L) 03/20/2021 0909   ALT 10 01/07/2023 1149   ALT 13 03/20/2021 0909   ALKPHOS 40 01/07/2023 1149   BILITOT 0.6 01/07/2023 1149   BILITOT 0.3 03/20/2021 0909   GFR 57.49 (L) 02/28/2010 1227   EGFR 31 (L) 11/06/2022 1436   EGFR 42 (L) 09/01/2021 1014   GFRNONAA 47 (L) 01/08/2023 0519   GFRNONAA 45 (L) 01/01/2023 1020   GFRNONAA 61 11/30/2019 0915    GFR Estimated Creatinine Clearance: 44.4 mL/min (A) (by C-G formula based on SCr of 1.57 mg/dL (H)). No results for input(s): "LIPASE", "AMYLASE" in the last 72 hours. No results for input(s): "CKTOTAL", "CKMB", "CKMBINDEX", "TROPONINI" in the last 72 hours. Invalid input(s): "POCBNP" No results for input(s): "DDIMER" in the last 72 hours. No results for input(s): "HGBA1C" in the last 72 hours. No results for input(s): "CHOL", "HDL", "LDLCALC", "TRIG", "CHOLHDL", "LDLDIRECT" in the last 72 hours. Recent Labs    01/08/23 0519  TSH 2.579   No results for input(s): "VITAMINB12", "FOLATE", "FERRITIN", "TIBC", "IRON",  "RETICCTPCT" in the last 72 hours. Coags: No results for input(s): "INR" in the last 72 hours.  Invalid input(s): "PT" Microbiology: No results found for this or any previous visit (from the past 240 hour(s)).  FURTHER DISCHARGE INSTRUCTIONS:  Get Medicines reviewed and adjusted: Please take all your medications with you for your next visit with your Primary MD  Laboratory/radiological data: Please request your Primary MD to go over all hospital tests and procedure/radiological results at the follow up, please ask your Primary MD to get all Hospital records sent to his/her office.  In some cases, they will be blood work, cultures and biopsy results pending at the time of your discharge. Please request that your primary care M.D. goes through all the records of your hospital data and follows up on these results.  Also Note the following: If you experience worsening of your admission symptoms, develop shortness of breath, life threatening emergency, suicidal or homicidal thoughts you must seek medical attention immediately by calling 911 or calling your MD immediately  if symptoms less severe.  You must read complete instructions/literature along with all the possible adverse reactions/side effects for all the Medicines you take and that have been prescribed to you. Take any new Medicines after you have completely understood and accpet all the possible adverse reactions/side effects.   Do not drive when taking Pain medications or sleeping medications (Benzodaizepines)  Do not take more than prescribed Pain, Sleep and Anxiety Medications. It is not advisable to combine anxiety,sleep and pain medications without talking with your primary care practitioner  Special Instructions: If you have smoked or chewed Tobacco  in the last 2 yrs please stop smoking, stop any regular Alcohol  and or any Recreational drug use.  Wear Seat  belts while driving.  Please note: You were cared for by a  hospitalist during your hospital stay. Once you are discharged, your primary care physician will handle any further medical issues. Please note that NO REFILLS for any discharge medications will be authorized once you are discharged, as it is imperative that you return to your primary care physician (or establish a relationship with a primary care physician if you do not have one) for your post hospital discharge needs so that they can reassess your need for medications and monitor your lab values.  Total Time spent coordinating discharge including counseling, education and face to face time equals greater than 30 minutes.  SignedJeoffrey Massed 01/10/2023 9:12 AM

## 2023-01-10 NOTE — Progress Notes (Signed)
Mobility Specialist Progress Note;   01/10/23 1430  Mobility  Activity Stood at bedside  Level of Assistance Minimal assist, patient does 75% or more  Assistive Device Front wheel walker  Activity Response Tolerated well  Mobility Referral Yes  $Mobility charge 1 Mobility  Mobility Specialist Start Time (ACUTE ONLY) 1430  Mobility Specialist Stop Time (ACUTE ONLY) 1445  Mobility Specialist Time Calculation (min) (ACUTE ONLY) 15 min   Pt agreeable to mobility w/ encouragement. Fatigued from PT session this AM so worked on STS from bedside w/ pt. Required minA for 5x STS and marching in place. No c/o during session. VSS throughout. Pt left on EOB with all needs met. Alarm on.   Caesar Bookman Mobility Specialist Please contact via SecureChat or Rehab Office [phone]

## 2023-01-11 ENCOUNTER — Other Ambulatory Visit: Payer: Self-pay | Admitting: Family Medicine

## 2023-01-11 DIAGNOSIS — I130 Hypertensive heart and chronic kidney disease with heart failure and stage 1 through stage 4 chronic kidney disease, or unspecified chronic kidney disease: Secondary | ICD-10-CM | POA: Diagnosis not present

## 2023-01-11 DIAGNOSIS — I10 Essential (primary) hypertension: Secondary | ICD-10-CM | POA: Diagnosis not present

## 2023-01-11 DIAGNOSIS — I4891 Unspecified atrial fibrillation: Secondary | ICD-10-CM | POA: Diagnosis not present

## 2023-01-11 DIAGNOSIS — G4089 Other seizures: Secondary | ICD-10-CM | POA: Diagnosis not present

## 2023-01-11 DIAGNOSIS — E1122 Type 2 diabetes mellitus with diabetic chronic kidney disease: Secondary | ICD-10-CM | POA: Diagnosis not present

## 2023-01-11 DIAGNOSIS — G40909 Epilepsy, unspecified, not intractable, without status epilepticus: Secondary | ICD-10-CM | POA: Diagnosis not present

## 2023-01-11 DIAGNOSIS — N183 Chronic kidney disease, stage 3 unspecified: Secondary | ICD-10-CM | POA: Diagnosis not present

## 2023-01-11 DIAGNOSIS — R278 Other lack of coordination: Secondary | ICD-10-CM | POA: Diagnosis not present

## 2023-01-11 DIAGNOSIS — M6281 Muscle weakness (generalized): Secondary | ICD-10-CM | POA: Diagnosis not present

## 2023-01-11 DIAGNOSIS — I509 Heart failure, unspecified: Secondary | ICD-10-CM | POA: Diagnosis not present

## 2023-01-11 DIAGNOSIS — I69354 Hemiplegia and hemiparesis following cerebral infarction affecting left non-dominant side: Secondary | ICD-10-CM | POA: Diagnosis not present

## 2023-01-11 NOTE — Telephone Encounter (Signed)
Rx was sent in for increased dose 01/10/23 by ED provider.  Requested Prescriptions  Refused Prescriptions Disp Refills   diltiazem (CARDIZEM CD) 120 MG 24 hr capsule [Pharmacy Med Name: DILTIAZEM 24H ER(CD) 120 MG CP] 180 capsule 0    Sig: TAKE 2 CAPSULES BY MOUTH DAILY.     Cardiovascular: Calcium Channel Blockers 3 Failed - 01/11/2023  1:33 AM      Failed - AST in normal range and within 360 days    AST  Date Value Ref Range Status  01/07/2023 11 (L) 15 - 41 U/L Final  03/20/2021 14 (L) 15 - 41 U/L Final         Failed - Cr in normal range and within 360 days    Creatinine  Date Value Ref Range Status  01/01/2023 1.64 (H) 0.61 - 1.24 mg/dL Final   Creat  Date Value Ref Range Status  11/29/2022 1.70 (H) 0.70 - 1.35 mg/dL Final   Creatinine, Ser  Date Value Ref Range Status  01/08/2023 1.57 (H) 0.61 - 1.24 mg/dL Final   Creatinine, Urine  Date Value Ref Range Status  12/12/2009 226.8 mg/dL Final         Failed - Valid encounter within last 6 months    Recent Outpatient Visits           1 year ago Atrial fibrillation, unspecified type (HCC)   [facility] Pickard, Priscille Heidelberg, MD   1 year ago Atrial fibrillation, unspecified type [facility])   [facility] Pickard, Priscille Heidelberg, MD   1 year ago Atrial fibrillation, unspecified type [facility])   [facility] Pickard, Priscille Heidelberg, MD   1 year ago Atrial fibrillation, unspecified type [facility])   [facility] [name], MD   1 year ago Atrial fibrillation, unspecified type [facility])   [facility] Pickard, Priscille Heidelberg, MD              Passed - ALT in normal range and within 360 days    ALT  Date Value Ref Range Status  01/07/2023 10 0 - 44 U/L Final  03/20/2021 13 0 - 44 U/L Final         Passed - Last BP in normal range    BP Readings from Last 1 Encounters:  01/10/23 105/70         Passed - Last Heart Rate in normal range    Pulse Readings from  Last 1 Encounters:  01/10/23 93

## 2023-01-14 DIAGNOSIS — E1122 Type 2 diabetes mellitus with diabetic chronic kidney disease: Secondary | ICD-10-CM | POA: Diagnosis not present

## 2023-01-14 DIAGNOSIS — G40909 Epilepsy, unspecified, not intractable, without status epilepticus: Secondary | ICD-10-CM | POA: Diagnosis not present

## 2023-01-14 DIAGNOSIS — I10 Essential (primary) hypertension: Secondary | ICD-10-CM | POA: Diagnosis not present

## 2023-01-14 DIAGNOSIS — N183 Chronic kidney disease, stage 3 unspecified: Secondary | ICD-10-CM | POA: Diagnosis not present

## 2023-01-14 DIAGNOSIS — I69354 Hemiplegia and hemiparesis following cerebral infarction affecting left non-dominant side: Secondary | ICD-10-CM | POA: Diagnosis not present

## 2023-01-14 DIAGNOSIS — I130 Hypertensive heart and chronic kidney disease with heart failure and stage 1 through stage 4 chronic kidney disease, or unspecified chronic kidney disease: Secondary | ICD-10-CM | POA: Diagnosis not present

## 2023-01-15 DIAGNOSIS — E1122 Type 2 diabetes mellitus with diabetic chronic kidney disease: Secondary | ICD-10-CM | POA: Diagnosis not present

## 2023-01-15 DIAGNOSIS — N183 Chronic kidney disease, stage 3 unspecified: Secondary | ICD-10-CM | POA: Diagnosis not present

## 2023-01-15 DIAGNOSIS — I130 Hypertensive heart and chronic kidney disease with heart failure and stage 1 through stage 4 chronic kidney disease, or unspecified chronic kidney disease: Secondary | ICD-10-CM | POA: Diagnosis not present

## 2023-01-15 DIAGNOSIS — I69354 Hemiplegia and hemiparesis following cerebral infarction affecting left non-dominant side: Secondary | ICD-10-CM | POA: Diagnosis not present

## 2023-01-15 DIAGNOSIS — R278 Other lack of coordination: Secondary | ICD-10-CM | POA: Diagnosis not present

## 2023-01-15 DIAGNOSIS — G40909 Epilepsy, unspecified, not intractable, without status epilepticus: Secondary | ICD-10-CM | POA: Diagnosis not present

## 2023-01-15 DIAGNOSIS — I10 Essential (primary) hypertension: Secondary | ICD-10-CM | POA: Diagnosis not present

## 2023-01-15 DIAGNOSIS — M6281 Muscle weakness (generalized): Secondary | ICD-10-CM | POA: Diagnosis not present

## 2023-01-15 DIAGNOSIS — I509 Heart failure, unspecified: Secondary | ICD-10-CM | POA: Diagnosis not present

## 2023-01-15 DIAGNOSIS — I4891 Unspecified atrial fibrillation: Secondary | ICD-10-CM | POA: Diagnosis not present

## 2023-01-15 DIAGNOSIS — G4089 Other seizures: Secondary | ICD-10-CM | POA: Diagnosis not present

## 2023-01-16 DIAGNOSIS — I69354 Hemiplegia and hemiparesis following cerebral infarction affecting left non-dominant side: Secondary | ICD-10-CM | POA: Diagnosis not present

## 2023-01-16 DIAGNOSIS — I130 Hypertensive heart and chronic kidney disease with heart failure and stage 1 through stage 4 chronic kidney disease, or unspecified chronic kidney disease: Secondary | ICD-10-CM | POA: Diagnosis not present

## 2023-01-16 DIAGNOSIS — G40909 Epilepsy, unspecified, not intractable, without status epilepticus: Secondary | ICD-10-CM | POA: Diagnosis not present

## 2023-01-16 DIAGNOSIS — N183 Chronic kidney disease, stage 3 unspecified: Secondary | ICD-10-CM | POA: Diagnosis not present

## 2023-01-16 DIAGNOSIS — E1122 Type 2 diabetes mellitus with diabetic chronic kidney disease: Secondary | ICD-10-CM | POA: Diagnosis not present

## 2023-01-16 DIAGNOSIS — I10 Essential (primary) hypertension: Secondary | ICD-10-CM | POA: Diagnosis not present

## 2023-01-21 DIAGNOSIS — N183 Chronic kidney disease, stage 3 unspecified: Secondary | ICD-10-CM | POA: Diagnosis not present

## 2023-01-21 DIAGNOSIS — I10 Essential (primary) hypertension: Secondary | ICD-10-CM | POA: Diagnosis not present

## 2023-01-21 DIAGNOSIS — I130 Hypertensive heart and chronic kidney disease with heart failure and stage 1 through stage 4 chronic kidney disease, or unspecified chronic kidney disease: Secondary | ICD-10-CM | POA: Diagnosis not present

## 2023-01-21 DIAGNOSIS — I69354 Hemiplegia and hemiparesis following cerebral infarction affecting left non-dominant side: Secondary | ICD-10-CM | POA: Diagnosis not present

## 2023-01-21 DIAGNOSIS — E1122 Type 2 diabetes mellitus with diabetic chronic kidney disease: Secondary | ICD-10-CM | POA: Diagnosis not present

## 2023-01-21 DIAGNOSIS — G40909 Epilepsy, unspecified, not intractable, without status epilepticus: Secondary | ICD-10-CM | POA: Diagnosis not present

## 2023-01-22 DIAGNOSIS — R278 Other lack of coordination: Secondary | ICD-10-CM | POA: Diagnosis not present

## 2023-01-22 DIAGNOSIS — M6281 Muscle weakness (generalized): Secondary | ICD-10-CM | POA: Diagnosis not present

## 2023-01-22 DIAGNOSIS — G4089 Other seizures: Secondary | ICD-10-CM | POA: Diagnosis not present

## 2023-01-22 DIAGNOSIS — I4891 Unspecified atrial fibrillation: Secondary | ICD-10-CM | POA: Diagnosis not present

## 2023-01-22 DIAGNOSIS — I509 Heart failure, unspecified: Secondary | ICD-10-CM | POA: Diagnosis not present

## 2023-01-23 DIAGNOSIS — I69354 Hemiplegia and hemiparesis following cerebral infarction affecting left non-dominant side: Secondary | ICD-10-CM | POA: Diagnosis not present

## 2023-01-23 DIAGNOSIS — N183 Chronic kidney disease, stage 3 unspecified: Secondary | ICD-10-CM | POA: Diagnosis not present

## 2023-01-23 DIAGNOSIS — G40909 Epilepsy, unspecified, not intractable, without status epilepticus: Secondary | ICD-10-CM | POA: Diagnosis not present

## 2023-01-23 DIAGNOSIS — I10 Essential (primary) hypertension: Secondary | ICD-10-CM | POA: Diagnosis not present

## 2023-01-23 DIAGNOSIS — E1122 Type 2 diabetes mellitus with diabetic chronic kidney disease: Secondary | ICD-10-CM | POA: Diagnosis not present

## 2023-01-23 DIAGNOSIS — I130 Hypertensive heart and chronic kidney disease with heart failure and stage 1 through stage 4 chronic kidney disease, or unspecified chronic kidney disease: Secondary | ICD-10-CM | POA: Diagnosis not present

## 2023-01-25 DIAGNOSIS — E1122 Type 2 diabetes mellitus with diabetic chronic kidney disease: Secondary | ICD-10-CM | POA: Diagnosis not present

## 2023-01-25 DIAGNOSIS — R278 Other lack of coordination: Secondary | ICD-10-CM | POA: Diagnosis not present

## 2023-01-25 DIAGNOSIS — I130 Hypertensive heart and chronic kidney disease with heart failure and stage 1 through stage 4 chronic kidney disease, or unspecified chronic kidney disease: Secondary | ICD-10-CM | POA: Diagnosis not present

## 2023-01-25 DIAGNOSIS — I509 Heart failure, unspecified: Secondary | ICD-10-CM | POA: Diagnosis not present

## 2023-01-25 DIAGNOSIS — M6281 Muscle weakness (generalized): Secondary | ICD-10-CM | POA: Diagnosis not present

## 2023-01-25 DIAGNOSIS — N183 Chronic kidney disease, stage 3 unspecified: Secondary | ICD-10-CM | POA: Diagnosis not present

## 2023-01-25 DIAGNOSIS — I4891 Unspecified atrial fibrillation: Secondary | ICD-10-CM | POA: Diagnosis not present

## 2023-01-25 DIAGNOSIS — I69354 Hemiplegia and hemiparesis following cerebral infarction affecting left non-dominant side: Secondary | ICD-10-CM | POA: Diagnosis not present

## 2023-01-25 DIAGNOSIS — I10 Essential (primary) hypertension: Secondary | ICD-10-CM | POA: Diagnosis not present

## 2023-01-25 DIAGNOSIS — G40909 Epilepsy, unspecified, not intractable, without status epilepticus: Secondary | ICD-10-CM | POA: Diagnosis not present

## 2023-01-25 DIAGNOSIS — G4089 Other seizures: Secondary | ICD-10-CM | POA: Diagnosis not present

## 2023-01-28 DIAGNOSIS — R296 Repeated falls: Secondary | ICD-10-CM | POA: Diagnosis not present

## 2023-01-29 DIAGNOSIS — I4891 Unspecified atrial fibrillation: Secondary | ICD-10-CM | POA: Diagnosis not present

## 2023-01-29 DIAGNOSIS — I509 Heart failure, unspecified: Secondary | ICD-10-CM | POA: Diagnosis not present

## 2023-01-29 DIAGNOSIS — R278 Other lack of coordination: Secondary | ICD-10-CM | POA: Diagnosis not present

## 2023-01-29 DIAGNOSIS — M6281 Muscle weakness (generalized): Secondary | ICD-10-CM | POA: Diagnosis not present

## 2023-01-29 DIAGNOSIS — G4089 Other seizures: Secondary | ICD-10-CM | POA: Diagnosis not present

## 2023-01-31 DIAGNOSIS — R41841 Cognitive communication deficit: Secondary | ICD-10-CM | POA: Diagnosis not present

## 2023-01-31 DIAGNOSIS — R1312 Dysphagia, oropharyngeal phase: Secondary | ICD-10-CM | POA: Diagnosis not present

## 2023-01-31 DIAGNOSIS — M6281 Muscle weakness (generalized): Secondary | ICD-10-CM | POA: Diagnosis not present

## 2023-01-31 DIAGNOSIS — R2689 Other abnormalities of gait and mobility: Secondary | ICD-10-CM | POA: Diagnosis not present

## 2023-01-31 DIAGNOSIS — G4089 Other seizures: Secondary | ICD-10-CM | POA: Diagnosis not present

## 2023-01-31 DIAGNOSIS — R488 Other symbolic dysfunctions: Secondary | ICD-10-CM | POA: Diagnosis not present

## 2023-01-31 DIAGNOSIS — Z741 Need for assistance with personal care: Secondary | ICD-10-CM | POA: Diagnosis not present

## 2023-02-01 ENCOUNTER — Ambulatory Visit
Admission: RE | Admit: 2023-02-01 | Discharge: 2023-02-01 | Disposition: A | Discharge status: Home / Self Care | Payer: Medicare HMO | Source: Ambulatory Visit | Attending: Radiation Oncology | Admitting: Radiation Oncology

## 2023-02-01 ENCOUNTER — Telehealth: Payer: Self-pay

## 2023-02-01 DIAGNOSIS — R41841 Cognitive communication deficit: Secondary | ICD-10-CM | POA: Diagnosis not present

## 2023-02-01 DIAGNOSIS — Z741 Need for assistance with personal care: Secondary | ICD-10-CM | POA: Diagnosis not present

## 2023-02-01 DIAGNOSIS — G40909 Epilepsy, unspecified, not intractable, without status epilepticus: Secondary | ICD-10-CM | POA: Diagnosis not present

## 2023-02-01 DIAGNOSIS — R296 Repeated falls: Secondary | ICD-10-CM | POA: Diagnosis not present

## 2023-02-01 DIAGNOSIS — E1122 Type 2 diabetes mellitus with diabetic chronic kidney disease: Secondary | ICD-10-CM | POA: Diagnosis not present

## 2023-02-01 DIAGNOSIS — I10 Essential (primary) hypertension: Secondary | ICD-10-CM | POA: Diagnosis not present

## 2023-02-01 DIAGNOSIS — G4089 Other seizures: Secondary | ICD-10-CM | POA: Diagnosis not present

## 2023-02-01 DIAGNOSIS — R1312 Dysphagia, oropharyngeal phase: Secondary | ICD-10-CM | POA: Diagnosis not present

## 2023-02-01 DIAGNOSIS — R488 Other symbolic dysfunctions: Secondary | ICD-10-CM | POA: Diagnosis not present

## 2023-02-01 DIAGNOSIS — M6281 Muscle weakness (generalized): Secondary | ICD-10-CM | POA: Diagnosis not present

## 2023-02-01 DIAGNOSIS — I130 Hypertensive heart and chronic kidney disease with heart failure and stage 1 through stage 4 chronic kidney disease, or unspecified chronic kidney disease: Secondary | ICD-10-CM | POA: Diagnosis not present

## 2023-02-01 DIAGNOSIS — I69354 Hemiplegia and hemiparesis following cerebral infarction affecting left non-dominant side: Secondary | ICD-10-CM | POA: Diagnosis not present

## 2023-02-01 DIAGNOSIS — N183 Chronic kidney disease, stage 3 unspecified: Secondary | ICD-10-CM | POA: Diagnosis not present

## 2023-02-01 DIAGNOSIS — R2689 Other abnormalities of gait and mobility: Secondary | ICD-10-CM | POA: Diagnosis not present

## 2023-02-01 DIAGNOSIS — I4891 Unspecified atrial fibrillation: Secondary | ICD-10-CM | POA: Diagnosis not present

## 2023-02-01 NOTE — Progress Notes (Incomplete)
Mr. 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

## 2023-02-01 NOTE — Telephone Encounter (Signed)
Noticed patient was not checked in for his 11:40 F/U appointment with Dr. [name]. Called and left a VM on patient's phone requesting a return call to see if patient would still be able to come into clinic today or needed to reschedule.

## 2023-02-05 DIAGNOSIS — M6281 Muscle weakness (generalized): Secondary | ICD-10-CM | POA: Diagnosis not present

## 2023-02-05 DIAGNOSIS — R2689 Other abnormalities of gait and mobility: Secondary | ICD-10-CM | POA: Diagnosis not present

## 2023-02-05 DIAGNOSIS — Z741 Need for assistance with personal care: Secondary | ICD-10-CM | POA: Diagnosis not present

## 2023-02-05 DIAGNOSIS — G4089 Other seizures: Secondary | ICD-10-CM | POA: Diagnosis not present

## 2023-02-05 DIAGNOSIS — R488 Other symbolic dysfunctions: Secondary | ICD-10-CM | POA: Diagnosis not present

## 2023-02-05 DIAGNOSIS — R41841 Cognitive communication deficit: Secondary | ICD-10-CM | POA: Diagnosis not present

## 2023-02-05 DIAGNOSIS — R1312 Dysphagia, oropharyngeal phase: Secondary | ICD-10-CM | POA: Diagnosis not present

## 2023-02-07 DIAGNOSIS — R488 Other symbolic dysfunctions: Secondary | ICD-10-CM | POA: Diagnosis not present

## 2023-02-07 DIAGNOSIS — R41841 Cognitive communication deficit: Secondary | ICD-10-CM | POA: Diagnosis not present

## 2023-02-07 DIAGNOSIS — Z741 Need for assistance with personal care: Secondary | ICD-10-CM | POA: Diagnosis not present

## 2023-02-07 DIAGNOSIS — G40909 Epilepsy, unspecified, not intractable, without status epilepticus: Secondary | ICD-10-CM | POA: Diagnosis not present

## 2023-02-07 DIAGNOSIS — R1312 Dysphagia, oropharyngeal phase: Secondary | ICD-10-CM | POA: Diagnosis not present

## 2023-02-07 DIAGNOSIS — N183 Chronic kidney disease, stage 3 unspecified: Secondary | ICD-10-CM | POA: Diagnosis not present

## 2023-02-07 DIAGNOSIS — M6281 Muscle weakness (generalized): Secondary | ICD-10-CM | POA: Diagnosis not present

## 2023-02-07 DIAGNOSIS — I130 Hypertensive heart and chronic kidney disease with heart failure and stage 1 through stage 4 chronic kidney disease, or unspecified chronic kidney disease: Secondary | ICD-10-CM | POA: Diagnosis not present

## 2023-02-07 DIAGNOSIS — R2689 Other abnormalities of gait and mobility: Secondary | ICD-10-CM | POA: Diagnosis not present

## 2023-02-07 DIAGNOSIS — E1122 Type 2 diabetes mellitus with diabetic chronic kidney disease: Secondary | ICD-10-CM | POA: Diagnosis not present

## 2023-02-07 DIAGNOSIS — I4891 Unspecified atrial fibrillation: Secondary | ICD-10-CM | POA: Diagnosis not present

## 2023-02-07 DIAGNOSIS — I69354 Hemiplegia and hemiparesis following cerebral infarction affecting left non-dominant side: Secondary | ICD-10-CM | POA: Diagnosis not present

## 2023-02-07 DIAGNOSIS — I10 Essential (primary) hypertension: Secondary | ICD-10-CM | POA: Diagnosis not present

## 2023-02-07 DIAGNOSIS — G4089 Other seizures: Secondary | ICD-10-CM | POA: Diagnosis not present

## 2023-02-08 DIAGNOSIS — M6281 Muscle weakness (generalized): Secondary | ICD-10-CM | POA: Diagnosis not present

## 2023-02-08 DIAGNOSIS — R2689 Other abnormalities of gait and mobility: Secondary | ICD-10-CM | POA: Diagnosis not present

## 2023-02-08 DIAGNOSIS — R1312 Dysphagia, oropharyngeal phase: Secondary | ICD-10-CM | POA: Diagnosis not present

## 2023-02-08 DIAGNOSIS — R488 Other symbolic dysfunctions: Secondary | ICD-10-CM | POA: Diagnosis not present

## 2023-02-08 DIAGNOSIS — R41841 Cognitive communication deficit: Secondary | ICD-10-CM | POA: Diagnosis not present

## 2023-02-08 DIAGNOSIS — Z741 Need for assistance with personal care: Secondary | ICD-10-CM | POA: Diagnosis not present

## 2023-02-08 DIAGNOSIS — G4089 Other seizures: Secondary | ICD-10-CM | POA: Diagnosis not present

## 2023-02-11 ENCOUNTER — Telehealth: Payer: Self-pay | Admitting: *Deleted

## 2023-02-11 DIAGNOSIS — Z741 Need for assistance with personal care: Secondary | ICD-10-CM | POA: Diagnosis not present

## 2023-02-11 DIAGNOSIS — M6281 Muscle weakness (generalized): Secondary | ICD-10-CM | POA: Diagnosis not present

## 2023-02-11 DIAGNOSIS — R2689 Other abnormalities of gait and mobility: Secondary | ICD-10-CM | POA: Diagnosis not present

## 2023-02-11 DIAGNOSIS — G4089 Other seizures: Secondary | ICD-10-CM | POA: Diagnosis not present

## 2023-02-11 DIAGNOSIS — R41841 Cognitive communication deficit: Secondary | ICD-10-CM | POA: Diagnosis not present

## 2023-02-11 DIAGNOSIS — R488 Other symbolic dysfunctions: Secondary | ICD-10-CM | POA: Diagnosis not present

## 2023-02-11 DIAGNOSIS — R1312 Dysphagia, oropharyngeal phase: Secondary | ICD-10-CM | POA: Diagnosis not present

## 2023-02-11 NOTE — Telephone Encounter (Signed)
RETURNED PATIENT'S SON- AARON'S PHONE CALL, SPOKE WITH PATIENT'S SON- AARON, FU APPT. FOR HIS DAD HAS BEEN RESCHEDULED FOR 02-27-23 @ 11 AM.

## 2023-02-12 DIAGNOSIS — G4089 Other seizures: Secondary | ICD-10-CM | POA: Diagnosis not present

## 2023-02-12 DIAGNOSIS — R488 Other symbolic dysfunctions: Secondary | ICD-10-CM | POA: Diagnosis not present

## 2023-02-12 DIAGNOSIS — Z741 Need for assistance with personal care: Secondary | ICD-10-CM | POA: Diagnosis not present

## 2023-02-12 DIAGNOSIS — I130 Hypertensive heart and chronic kidney disease with heart failure and stage 1 through stage 4 chronic kidney disease, or unspecified chronic kidney disease: Secondary | ICD-10-CM | POA: Diagnosis not present

## 2023-02-12 DIAGNOSIS — M6281 Muscle weakness (generalized): Secondary | ICD-10-CM | POA: Diagnosis not present

## 2023-02-12 DIAGNOSIS — R1312 Dysphagia, oropharyngeal phase: Secondary | ICD-10-CM | POA: Diagnosis not present

## 2023-02-12 DIAGNOSIS — R41841 Cognitive communication deficit: Secondary | ICD-10-CM | POA: Diagnosis not present

## 2023-02-12 DIAGNOSIS — R2689 Other abnormalities of gait and mobility: Secondary | ICD-10-CM | POA: Diagnosis not present

## 2023-02-14 DIAGNOSIS — R1312 Dysphagia, oropharyngeal phase: Secondary | ICD-10-CM | POA: Diagnosis not present

## 2023-02-14 DIAGNOSIS — R488 Other symbolic dysfunctions: Secondary | ICD-10-CM | POA: Diagnosis not present

## 2023-02-14 DIAGNOSIS — Z741 Need for assistance with personal care: Secondary | ICD-10-CM | POA: Diagnosis not present

## 2023-02-14 DIAGNOSIS — G4089 Other seizures: Secondary | ICD-10-CM | POA: Diagnosis not present

## 2023-02-14 DIAGNOSIS — R2689 Other abnormalities of gait and mobility: Secondary | ICD-10-CM | POA: Diagnosis not present

## 2023-02-14 DIAGNOSIS — R41841 Cognitive communication deficit: Secondary | ICD-10-CM | POA: Diagnosis not present

## 2023-02-14 DIAGNOSIS — M6281 Muscle weakness (generalized): Secondary | ICD-10-CM | POA: Diagnosis not present

## 2023-02-15 DIAGNOSIS — R488 Other symbolic dysfunctions: Secondary | ICD-10-CM | POA: Diagnosis not present

## 2023-02-15 DIAGNOSIS — R41841 Cognitive communication deficit: Secondary | ICD-10-CM | POA: Diagnosis not present

## 2023-02-15 DIAGNOSIS — M6281 Muscle weakness (generalized): Secondary | ICD-10-CM | POA: Diagnosis not present

## 2023-02-15 DIAGNOSIS — Z741 Need for assistance with personal care: Secondary | ICD-10-CM | POA: Diagnosis not present

## 2023-02-15 DIAGNOSIS — R2689 Other abnormalities of gait and mobility: Secondary | ICD-10-CM | POA: Diagnosis not present

## 2023-02-15 DIAGNOSIS — R1312 Dysphagia, oropharyngeal phase: Secondary | ICD-10-CM | POA: Diagnosis not present

## 2023-02-15 DIAGNOSIS — G4089 Other seizures: Secondary | ICD-10-CM | POA: Diagnosis not present

## 2023-02-16 DIAGNOSIS — R1312 Dysphagia, oropharyngeal phase: Secondary | ICD-10-CM | POA: Diagnosis not present

## 2023-02-16 DIAGNOSIS — R2689 Other abnormalities of gait and mobility: Secondary | ICD-10-CM | POA: Diagnosis not present

## 2023-02-16 DIAGNOSIS — R41841 Cognitive communication deficit: Secondary | ICD-10-CM | POA: Diagnosis not present

## 2023-02-16 DIAGNOSIS — G4089 Other seizures: Secondary | ICD-10-CM | POA: Diagnosis not present

## 2023-02-16 DIAGNOSIS — R488 Other symbolic dysfunctions: Secondary | ICD-10-CM | POA: Diagnosis not present

## 2023-02-16 DIAGNOSIS — M6281 Muscle weakness (generalized): Secondary | ICD-10-CM | POA: Diagnosis not present

## 2023-02-16 DIAGNOSIS — Z741 Need for assistance with personal care: Secondary | ICD-10-CM | POA: Diagnosis not present

## 2023-02-19 ENCOUNTER — Encounter (HOSPITAL_COMMUNITY): Payer: Medicare HMO

## 2023-02-19 DIAGNOSIS — E1122 Type 2 diabetes mellitus with diabetic chronic kidney disease: Secondary | ICD-10-CM | POA: Diagnosis not present

## 2023-02-19 DIAGNOSIS — G40909 Epilepsy, unspecified, not intractable, without status epilepticus: Secondary | ICD-10-CM | POA: Diagnosis not present

## 2023-02-19 DIAGNOSIS — I10 Essential (primary) hypertension: Secondary | ICD-10-CM | POA: Diagnosis not present

## 2023-02-19 DIAGNOSIS — I130 Hypertensive heart and chronic kidney disease with heart failure and stage 1 through stage 4 chronic kidney disease, or unspecified chronic kidney disease: Secondary | ICD-10-CM | POA: Diagnosis not present

## 2023-02-19 DIAGNOSIS — I69354 Hemiplegia and hemiparesis following cerebral infarction affecting left non-dominant side: Secondary | ICD-10-CM | POA: Diagnosis not present

## 2023-02-19 DIAGNOSIS — N183 Chronic kidney disease, stage 3 unspecified: Secondary | ICD-10-CM | POA: Diagnosis not present

## 2023-02-19 DIAGNOSIS — I4891 Unspecified atrial fibrillation: Secondary | ICD-10-CM | POA: Diagnosis not present

## 2023-02-20 DIAGNOSIS — I442 Atrioventricular block, complete: Secondary | ICD-10-CM | POA: Insufficient documentation

## 2023-02-21 DIAGNOSIS — R2689 Other abnormalities of gait and mobility: Secondary | ICD-10-CM | POA: Diagnosis not present

## 2023-02-21 DIAGNOSIS — Z741 Need for assistance with personal care: Secondary | ICD-10-CM | POA: Diagnosis not present

## 2023-02-21 DIAGNOSIS — R262 Difficulty in walking, not elsewhere classified: Secondary | ICD-10-CM | POA: Diagnosis not present

## 2023-02-21 DIAGNOSIS — M6259 Muscle wasting and atrophy, not elsewhere classified, multiple sites: Secondary | ICD-10-CM | POA: Diagnosis not present

## 2023-02-21 DIAGNOSIS — G4089 Other seizures: Secondary | ICD-10-CM | POA: Diagnosis not present

## 2023-02-21 DIAGNOSIS — M6281 Muscle weakness (generalized): Secondary | ICD-10-CM | POA: Diagnosis not present

## 2023-02-22 DIAGNOSIS — M6281 Muscle weakness (generalized): Secondary | ICD-10-CM | POA: Diagnosis not present

## 2023-02-22 DIAGNOSIS — M6259 Muscle wasting and atrophy, not elsewhere classified, multiple sites: Secondary | ICD-10-CM | POA: Diagnosis not present

## 2023-02-22 DIAGNOSIS — G4089 Other seizures: Secondary | ICD-10-CM | POA: Diagnosis not present

## 2023-02-22 DIAGNOSIS — R2689 Other abnormalities of gait and mobility: Secondary | ICD-10-CM | POA: Diagnosis not present

## 2023-02-22 DIAGNOSIS — Z741 Need for assistance with personal care: Secondary | ICD-10-CM | POA: Diagnosis not present

## 2023-02-22 DIAGNOSIS — R262 Difficulty in walking, not elsewhere classified: Secondary | ICD-10-CM | POA: Diagnosis not present

## 2023-02-26 ENCOUNTER — Inpatient Hospital Stay (HOSPITAL_COMMUNITY): Payer: Medicare HMO

## 2023-02-26 ENCOUNTER — Emergency Department (HOSPITAL_COMMUNITY): Payer: Medicare HMO

## 2023-02-26 ENCOUNTER — Encounter (HOSPITAL_COMMUNITY)
Admission: EM | Disposition: A | Discharge status: Skilled Nursing Facility | Payer: Self-pay | Source: Skilled Nursing Facility | Attending: Internal Medicine

## 2023-02-26 ENCOUNTER — Inpatient Hospital Stay (HOSPITAL_COMMUNITY)
Admission: EM | Admit: 2023-02-26 | Discharge: 2023-03-07 | DRG: 907 | Disposition: A | Discharge status: Skilled Nursing Facility | Payer: Medicare HMO | Source: Skilled Nursing Facility | Attending: Internal Medicine | Admitting: Internal Medicine

## 2023-02-26 DIAGNOSIS — T6594XA Toxic effect of unspecified substance, undetermined, initial encounter: Secondary | ICD-10-CM | POA: Diagnosis not present

## 2023-02-26 DIAGNOSIS — I700 Atherosclerosis of aorta: Secondary | ICD-10-CM | POA: Diagnosis not present

## 2023-02-26 DIAGNOSIS — I5042 Chronic combined systolic (congestive) and diastolic (congestive) heart failure: Secondary | ICD-10-CM | POA: Diagnosis present

## 2023-02-26 DIAGNOSIS — I4892 Unspecified atrial flutter: Secondary | ICD-10-CM | POA: Diagnosis present

## 2023-02-26 DIAGNOSIS — G43909 Migraine, unspecified, not intractable, without status migrainosus: Secondary | ICD-10-CM | POA: Diagnosis present

## 2023-02-26 DIAGNOSIS — Z85810 Personal history of malignant neoplasm of tongue: Secondary | ICD-10-CM

## 2023-02-26 DIAGNOSIS — K567 Ileus, unspecified: Secondary | ICD-10-CM | POA: Diagnosis present

## 2023-02-26 DIAGNOSIS — R0989 Other specified symptoms and signs involving the circulatory and respiratory systems: Secondary | ICD-10-CM | POA: Diagnosis not present

## 2023-02-26 DIAGNOSIS — E46 Unspecified protein-calorie malnutrition: Secondary | ICD-10-CM | POA: Diagnosis present

## 2023-02-26 DIAGNOSIS — Z7901 Long term (current) use of anticoagulants: Secondary | ICD-10-CM | POA: Diagnosis not present

## 2023-02-26 DIAGNOSIS — K2980 Duodenitis without bleeding: Secondary | ICD-10-CM | POA: Diagnosis present

## 2023-02-26 DIAGNOSIS — Z4682 Encounter for fitting and adjustment of non-vascular catheter: Secondary | ICD-10-CM | POA: Diagnosis not present

## 2023-02-26 DIAGNOSIS — G9341 Metabolic encephalopathy: Secondary | ICD-10-CM | POA: Diagnosis not present

## 2023-02-26 DIAGNOSIS — Z885 Allergy status to narcotic agent status: Secondary | ICD-10-CM

## 2023-02-26 DIAGNOSIS — D696 Thrombocytopenia, unspecified: Secondary | ICD-10-CM | POA: Diagnosis present

## 2023-02-26 DIAGNOSIS — R1312 Dysphagia, oropharyngeal phase: Secondary | ICD-10-CM | POA: Diagnosis not present

## 2023-02-26 DIAGNOSIS — I10 Essential (primary) hypertension: Secondary | ICD-10-CM

## 2023-02-26 DIAGNOSIS — Z800 Family history of malignant neoplasm of digestive organs: Secondary | ICD-10-CM

## 2023-02-26 DIAGNOSIS — E119 Type 2 diabetes mellitus without complications: Secondary | ICD-10-CM | POA: Diagnosis not present

## 2023-02-26 DIAGNOSIS — D61818 Other pancytopenia: Secondary | ICD-10-CM | POA: Diagnosis present

## 2023-02-26 DIAGNOSIS — K449 Diaphragmatic hernia without obstruction or gangrene: Secondary | ICD-10-CM | POA: Diagnosis not present

## 2023-02-26 DIAGNOSIS — R001 Bradycardia, unspecified: Secondary | ICD-10-CM | POA: Diagnosis not present

## 2023-02-26 DIAGNOSIS — K920 Hematemesis: Secondary | ICD-10-CM | POA: Diagnosis present

## 2023-02-26 DIAGNOSIS — R579 Shock, unspecified: Secondary | ICD-10-CM | POA: Diagnosis present

## 2023-02-26 DIAGNOSIS — R195 Other fecal abnormalities: Secondary | ICD-10-CM | POA: Diagnosis not present

## 2023-02-26 DIAGNOSIS — E785 Hyperlipidemia, unspecified: Secondary | ICD-10-CM | POA: Diagnosis not present

## 2023-02-26 DIAGNOSIS — Z794 Long term (current) use of insulin: Secondary | ICD-10-CM

## 2023-02-26 DIAGNOSIS — Z7984 Long term (current) use of oral hypoglycemic drugs: Secondary | ICD-10-CM

## 2023-02-26 DIAGNOSIS — Z888 Allergy status to other drugs, medicaments and biological substances status: Secondary | ICD-10-CM

## 2023-02-26 DIAGNOSIS — I517 Cardiomegaly: Secondary | ICD-10-CM | POA: Diagnosis not present

## 2023-02-26 DIAGNOSIS — R112 Nausea with vomiting, unspecified: Secondary | ICD-10-CM

## 2023-02-26 DIAGNOSIS — I480 Paroxysmal atrial fibrillation: Secondary | ICD-10-CM | POA: Diagnosis present

## 2023-02-26 DIAGNOSIS — M6259 Muscle wasting and atrophy, not elsewhere classified, multiple sites: Secondary | ICD-10-CM | POA: Diagnosis not present

## 2023-02-26 DIAGNOSIS — R262 Difficulty in walking, not elsewhere classified: Secondary | ICD-10-CM | POA: Diagnosis not present

## 2023-02-26 DIAGNOSIS — K2970 Gastritis, unspecified, without bleeding: Secondary | ICD-10-CM | POA: Diagnosis not present

## 2023-02-26 DIAGNOSIS — R1110 Vomiting, unspecified: Secondary | ICD-10-CM | POA: Diagnosis not present

## 2023-02-26 DIAGNOSIS — Z634 Disappearance and death of family member: Secondary | ICD-10-CM

## 2023-02-26 DIAGNOSIS — I4891 Unspecified atrial fibrillation: Secondary | ICD-10-CM

## 2023-02-26 DIAGNOSIS — R739 Hyperglycemia, unspecified: Secondary | ICD-10-CM | POA: Diagnosis not present

## 2023-02-26 DIAGNOSIS — N1832 Chronic kidney disease, stage 3b: Secondary | ICD-10-CM | POA: Diagnosis present

## 2023-02-26 DIAGNOSIS — I442 Atrioventricular block, complete: Secondary | ICD-10-CM

## 2023-02-26 DIAGNOSIS — R918 Other nonspecific abnormal finding of lung field: Secondary | ICD-10-CM | POA: Diagnosis not present

## 2023-02-26 DIAGNOSIS — R2689 Other abnormalities of gait and mobility: Secondary | ICD-10-CM | POA: Diagnosis not present

## 2023-02-26 DIAGNOSIS — R41841 Cognitive communication deficit: Secondary | ICD-10-CM | POA: Diagnosis not present

## 2023-02-26 DIAGNOSIS — E861 Hypovolemia: Secondary | ICD-10-CM | POA: Diagnosis present

## 2023-02-26 DIAGNOSIS — J90 Pleural effusion, not elsewhere classified: Secondary | ICD-10-CM | POA: Diagnosis not present

## 2023-02-26 DIAGNOSIS — M6281 Muscle weakness (generalized): Secondary | ICD-10-CM | POA: Diagnosis not present

## 2023-02-26 DIAGNOSIS — E1165 Type 2 diabetes mellitus with hyperglycemia: Secondary | ICD-10-CM | POA: Diagnosis present

## 2023-02-26 DIAGNOSIS — K3189 Other diseases of stomach and duodenum: Secondary | ICD-10-CM | POA: Diagnosis not present

## 2023-02-26 DIAGNOSIS — E1143 Type 2 diabetes mellitus with diabetic autonomic (poly)neuropathy: Secondary | ICD-10-CM | POA: Diagnosis present

## 2023-02-26 DIAGNOSIS — F1290 Cannabis use, unspecified, uncomplicated: Secondary | ICD-10-CM | POA: Diagnosis not present

## 2023-02-26 DIAGNOSIS — I6782 Cerebral ischemia: Secondary | ICD-10-CM | POA: Diagnosis not present

## 2023-02-26 DIAGNOSIS — I130 Hypertensive heart and chronic kidney disease with heart failure and stage 1 through stage 4 chronic kidney disease, or unspecified chronic kidney disease: Secondary | ICD-10-CM | POA: Diagnosis present

## 2023-02-26 DIAGNOSIS — R5381 Other malaise: Secondary | ICD-10-CM

## 2023-02-26 DIAGNOSIS — Z79899 Other long term (current) drug therapy: Secondary | ICD-10-CM

## 2023-02-26 DIAGNOSIS — R1111 Vomiting without nausea: Secondary | ICD-10-CM | POA: Diagnosis not present

## 2023-02-26 DIAGNOSIS — Z85118 Personal history of other malignant neoplasm of bronchus and lung: Secondary | ICD-10-CM

## 2023-02-26 DIAGNOSIS — I959 Hypotension, unspecified: Secondary | ICD-10-CM | POA: Diagnosis not present

## 2023-02-26 DIAGNOSIS — N179 Acute kidney failure, unspecified: Secondary | ICD-10-CM

## 2023-02-26 DIAGNOSIS — D631 Anemia in chronic kidney disease: Secondary | ICD-10-CM | POA: Diagnosis present

## 2023-02-26 DIAGNOSIS — Z741 Need for assistance with personal care: Secondary | ICD-10-CM | POA: Diagnosis not present

## 2023-02-26 DIAGNOSIS — R45851 Suicidal ideations: Secondary | ICD-10-CM | POA: Diagnosis not present

## 2023-02-26 DIAGNOSIS — Z803 Family history of malignant neoplasm of breast: Secondary | ICD-10-CM

## 2023-02-26 DIAGNOSIS — I452 Bifascicular block: Secondary | ICD-10-CM | POA: Diagnosis present

## 2023-02-26 DIAGNOSIS — I5022 Chronic systolic (congestive) heart failure: Secondary | ICD-10-CM | POA: Diagnosis not present

## 2023-02-26 DIAGNOSIS — Z8249 Family history of ischemic heart disease and other diseases of the circulatory system: Secondary | ICD-10-CM

## 2023-02-26 DIAGNOSIS — E875 Hyperkalemia: Secondary | ICD-10-CM | POA: Diagnosis present

## 2023-02-26 DIAGNOSIS — F4321 Adjustment disorder with depressed mood: Secondary | ICD-10-CM | POA: Diagnosis not present

## 2023-02-26 DIAGNOSIS — G928 Other toxic encephalopathy: Secondary | ICD-10-CM | POA: Diagnosis present

## 2023-02-26 DIAGNOSIS — Z860100 Personal history of colon polyps, unspecified: Secondary | ICD-10-CM

## 2023-02-26 DIAGNOSIS — E1122 Type 2 diabetes mellitus with diabetic chronic kidney disease: Secondary | ICD-10-CM | POA: Diagnosis present

## 2023-02-26 DIAGNOSIS — I6381 Other cerebral infarction due to occlusion or stenosis of small artery: Secondary | ICD-10-CM | POA: Diagnosis not present

## 2023-02-26 DIAGNOSIS — Z781 Physical restraint status: Secondary | ICD-10-CM

## 2023-02-26 DIAGNOSIS — R140 Abdominal distension (gaseous): Secondary | ICD-10-CM | POA: Diagnosis not present

## 2023-02-26 DIAGNOSIS — E871 Hypo-osmolality and hyponatremia: Secondary | ICD-10-CM | POA: Diagnosis present

## 2023-02-26 DIAGNOSIS — R531 Weakness: Secondary | ICD-10-CM | POA: Diagnosis not present

## 2023-02-26 DIAGNOSIS — G4089 Other seizures: Secondary | ICD-10-CM | POA: Diagnosis not present

## 2023-02-26 DIAGNOSIS — G40909 Epilepsy, unspecified, not intractable, without status epilepticus: Secondary | ICD-10-CM | POA: Diagnosis present

## 2023-02-26 DIAGNOSIS — K2900 Acute gastritis without bleeding: Secondary | ICD-10-CM | POA: Diagnosis not present

## 2023-02-26 DIAGNOSIS — R4182 Altered mental status, unspecified: Secondary | ICD-10-CM | POA: Diagnosis not present

## 2023-02-26 DIAGNOSIS — I69391 Dysphagia following cerebral infarction: Secondary | ICD-10-CM

## 2023-02-26 DIAGNOSIS — I5020 Unspecified systolic (congestive) heart failure: Secondary | ICD-10-CM | POA: Diagnosis not present

## 2023-02-26 DIAGNOSIS — Z6822 Body mass index (BMI) 22.0-22.9, adult: Secondary | ICD-10-CM

## 2023-02-26 DIAGNOSIS — I69354 Hemiplegia and hemiparesis following cerebral infarction affecting left non-dominant side: Secondary | ICD-10-CM | POA: Diagnosis not present

## 2023-02-26 DIAGNOSIS — Z87891 Personal history of nicotine dependence: Secondary | ICD-10-CM

## 2023-02-26 DIAGNOSIS — R54 Age-related physical debility: Secondary | ICD-10-CM | POA: Diagnosis present

## 2023-02-26 DIAGNOSIS — K319 Disease of stomach and duodenum, unspecified: Secondary | ICD-10-CM | POA: Diagnosis not present

## 2023-02-26 HISTORY — PX: TEMPORARY PACEMAKER: CATH118268

## 2023-02-26 LAB — I-STAT CHEM 8, ED
BUN: 44 mg/dL — ABNORMAL HIGH (ref 8–23)
Calcium, Ion: 1.13 mmol/L — ABNORMAL LOW (ref 1.15–1.40)
Chloride: 104 mmol/L (ref 98–111)
Creatinine, Ser: 3 mg/dL — ABNORMAL HIGH (ref 0.61–1.24)
Glucose, Bld: 109 mg/dL — ABNORMAL HIGH (ref 70–99)
HCT: 25 % — ABNORMAL LOW (ref 39.0–52.0)
Hemoglobin: 8.5 g/dL — ABNORMAL LOW (ref 13.0–17.0)
Potassium: 5.5 mmol/L — ABNORMAL HIGH (ref 3.5–5.1)
Sodium: 134 mmol/L — ABNORMAL LOW (ref 135–145)
TCO2: 22 mmol/L (ref 22–32)

## 2023-02-26 LAB — URINALYSIS, ROUTINE W REFLEX MICROSCOPIC
Bilirubin Urine: NEGATIVE
Glucose, UA: >=500 mg/dL — AB
Hgb urine dipstick: NEGATIVE
Ketones, ur: NEGATIVE mg/dL
Leukocytes,Ua: NEGATIVE
Nitrite: NEGATIVE
Protein, ur: NEGATIVE mg/dL
Specific Gravity, Urine: 1.009 (ref 1.005–1.030)
pH: 5 (ref 5.0–8.0)

## 2023-02-26 LAB — COMPREHENSIVE METABOLIC PANEL
ALT: 18 U/L (ref 0–44)
AST: 13 U/L — ABNORMAL LOW (ref 15–41)
Albumin: 2.9 g/dL — ABNORMAL LOW (ref 3.5–5.0)
Alkaline Phosphatase: 60 U/L (ref 38–126)
Anion gap: 9 (ref 5–15)
BUN: 52 mg/dL — ABNORMAL HIGH (ref 8–23)
CO2: 20 mmol/L — ABNORMAL LOW (ref 22–32)
Calcium: 8.3 mg/dL — ABNORMAL LOW (ref 8.9–10.3)
Chloride: 105 mmol/L (ref 98–111)
Creatinine, Ser: 2.84 mg/dL — ABNORMAL HIGH (ref 0.61–1.24)
GFR, Estimated: 23 mL/min — ABNORMAL LOW (ref >60)
Glucose, Bld: 114 mg/dL — ABNORMAL HIGH (ref 70–99)
Potassium: 5.6 mmol/L — ABNORMAL HIGH (ref 3.5–5.1)
Sodium: 134 mmol/L — ABNORMAL LOW (ref 135–145)
Total Bilirubin: 0.6 mg/dL (ref 0.0–1.2)
Total Protein: 5.3 g/dL — ABNORMAL LOW (ref 6.5–8.1)

## 2023-02-26 LAB — RAPID URINE DRUG SCREEN, HOSP PERFORMED
Amphetamines: NOT DETECTED
Barbiturates: NOT DETECTED
Benzodiazepines: NOT DETECTED
Cocaine: NOT DETECTED
Opiates: NOT DETECTED
Tetrahydrocannabinol: POSITIVE — AB

## 2023-02-26 LAB — BLOOD GAS, VENOUS
Acid-base deficit: 5.6 mmol/L — ABNORMAL HIGH (ref 0.0–2.0)
Bicarbonate: 21.9 mmol/L (ref 20.0–28.0)
O2 Saturation: 79.1 %
Patient temperature: 34.4
pCO2, Ven: 45 mm[Hg] (ref 44–60)
pH, Ven: 7.28 (ref 7.25–7.43)
pO2, Ven: 44 mm[Hg] (ref 32–45)

## 2023-02-26 LAB — CBC WITH DIFFERENTIAL/PLATELET
Abs Immature Granulocytes: 0.01 10*3/uL (ref 0.00–0.07)
Basophils Absolute: 0 10*3/uL (ref 0.0–0.1)
Basophils Relative: 0 %
Eosinophils Absolute: 0 10*3/uL (ref 0.0–0.5)
Eosinophils Relative: 1 %
HCT: 27.7 % — ABNORMAL LOW (ref 39.0–52.0)
Hemoglobin: 8.6 g/dL — ABNORMAL LOW (ref 13.0–17.0)
Immature Granulocytes: 0 %
Lymphocytes Relative: 9 %
Lymphs Abs: 0.4 10*3/uL — ABNORMAL LOW (ref 0.7–4.0)
MCH: 28.5 pg (ref 26.0–34.0)
MCHC: 31 g/dL (ref 30.0–36.0)
MCV: 91.7 fL (ref 80.0–100.0)
Monocytes Absolute: 0.5 10*3/uL (ref 0.1–1.0)
Monocytes Relative: 13 %
Neutro Abs: 3 10*3/uL (ref 1.7–7.7)
Neutrophils Relative %: 77 %
Platelets: 130 10*3/uL — ABNORMAL LOW (ref 150–400)
RBC: 3.02 MIL/uL — ABNORMAL LOW (ref 4.22–5.81)
RDW: 16.7 % — ABNORMAL HIGH (ref 11.5–15.5)
WBC: 4 10*3/uL (ref 4.0–10.5)
nRBC: 0 % (ref 0.0–0.2)

## 2023-02-26 LAB — CBC
HCT: 31.9 % — ABNORMAL LOW (ref 39.0–52.0)
Hemoglobin: 10 g/dL — ABNORMAL LOW (ref 13.0–17.0)
MCH: 28.5 pg (ref 26.0–34.0)
MCHC: 31.3 g/dL (ref 30.0–36.0)
MCV: 90.9 fL (ref 80.0–100.0)
Platelets: 140 10*3/uL — ABNORMAL LOW (ref 150–400)
RBC: 3.51 MIL/uL — ABNORMAL LOW (ref 4.22–5.81)
RDW: 16.9 % — ABNORMAL HIGH (ref 11.5–15.5)
WBC: 5.4 10*3/uL (ref 4.0–10.5)
nRBC: 0 % (ref 0.0–0.2)

## 2023-02-26 LAB — CREATININE, SERUM
Creatinine, Ser: 2.69 mg/dL — ABNORMAL HIGH (ref 0.61–1.24)
GFR, Estimated: 25 mL/min — ABNORMAL LOW (ref >60)

## 2023-02-26 LAB — MRSA NEXT GEN BY PCR, NASAL: MRSA by PCR Next Gen: NOT DETECTED

## 2023-02-26 LAB — T4, FREE: Free T4: 0.69 ng/dL (ref 0.61–1.12)

## 2023-02-26 LAB — TROPONIN I (HIGH SENSITIVITY)
Troponin I (High Sensitivity): 12 ng/L (ref <18)
Troponin I (High Sensitivity): 13 ng/L (ref <18)

## 2023-02-26 LAB — MAGNESIUM: Magnesium: 2.6 mg/dL — ABNORMAL HIGH (ref 1.7–2.4)

## 2023-02-26 LAB — LACTIC ACID, PLASMA: Lactic Acid, Venous: 1.6 mmol/L (ref 0.5–1.9)

## 2023-02-26 LAB — PROCALCITONIN: Procalcitonin: <0.1 ng/mL

## 2023-02-26 LAB — BRAIN NATRIURETIC PEPTIDE: B Natriuretic Peptide: 303 pg/mL — ABNORMAL HIGH (ref 0.0–100.0)

## 2023-02-26 LAB — TSH: TSH: 1.76 u[IU]/mL (ref 0.350–4.500)

## 2023-02-26 LAB — GLUCOSE, CAPILLARY: Glucose-Capillary: 177 mg/dL — ABNORMAL HIGH (ref 70–99)

## 2023-02-26 LAB — D-DIMER, QUANTITATIVE: D-Dimer, Quant: 0.86 ug{FEU}/mL — ABNORMAL HIGH (ref 0.00–0.50)

## 2023-02-26 LAB — I-STAT CG4 LACTIC ACID, ED: Lactic Acid, Venous: 0.7 mmol/L (ref 0.5–1.9)

## 2023-02-26 LAB — CBG MONITORING, ED: Glucose-Capillary: 106 mg/dL — ABNORMAL HIGH (ref 70–99)

## 2023-02-26 SURGERY — TEMPORARY PACEMAKER
Anesthesia: LOCAL

## 2023-02-26 MED ORDER — INSULIN ASPART 100 UNIT/ML IJ SOLN: 0.0000 [IU] | INTRAMUSCULAR | Status: DC

## 2023-02-26 MED ORDER — DEXMEDETOMIDINE HCL IN NACL 400 MCG/100ML IV SOLN
0.0000 ug/kg/h | INTRAVENOUS | Status: DC
  Administered 2023-02-26: 0.4 ug/kg/h via INTRAVENOUS
  Administered 2023-02-27: 0.6 ug/kg/h via INTRAVENOUS
  Filled 2023-02-26 (×2): qty 100

## 2023-02-26 MED ORDER — SODIUM CHLORIDE 0.9% FLUSH
3.0000 mL | INTRAVENOUS | Status: DC | PRN
  Administered 2023-02-26: 3 mL via INTRAVENOUS

## 2023-02-26 MED ORDER — HEPARIN SODIUM (PORCINE) 5000 UNIT/ML IJ SOLN: 5000.0000 [IU] | Freq: Three times a day (TID) | INTRAMUSCULAR | Status: DC

## 2023-02-26 MED ORDER — CALCIUM CHLORIDE 10 % IV SOLN
1.0000 g | INTRAVENOUS | Status: AC
  Administered 2023-02-26 (×3): 1 g via INTRAVENOUS

## 2023-02-26 MED ORDER — HEPARIN (PORCINE) 25000 UT/250ML-% IV SOLN
1050.0000 [IU]/h | INTRAVENOUS | Status: DC
  Administered 2023-02-26: 1050 [IU]/h via INTRAVENOUS
  Filled 2023-02-26: qty 250

## 2023-02-26 MED ORDER — CHLORHEXIDINE GLUCONATE CLOTH 2 % EX PADS
6.0000 | MEDICATED_PAD | Freq: Every day | CUTANEOUS | Status: DC
  Administered 2023-02-26 – 2023-03-04 (×7): 6 via TOPICAL

## 2023-02-26 MED ORDER — FENTANYL CITRATE PF 50 MCG/ML IJ SOSY
50.0000 ug | PREFILLED_SYRINGE | INTRAMUSCULAR | Status: DC | PRN
  Administered 2023-02-26: 50 ug via INTRAVENOUS
  Filled 2023-02-26: qty 1

## 2023-02-26 MED ORDER — SODIUM CHLORIDE 0.9 % IV SOLN: 250.0000 mL | INTRAVENOUS | Status: AC | PRN

## 2023-02-26 MED ORDER — LIDOCAINE HCL (PF) 1 % IJ SOLN
INTRAMUSCULAR | Status: DC | PRN
  Administered 2023-02-26: 10 mL

## 2023-02-26 MED ORDER — HEPARIN (PORCINE) IN NACL 1000-0.9 UT/500ML-% IV SOLN
INTRAVENOUS | Status: DC | PRN
  Administered 2023-02-26: 500 mL via INTRAVENOUS

## 2023-02-26 MED ORDER — SODIUM CHLORIDE 0.9% FLUSH
3.0000 mL | Freq: Two times a day (BID) | INTRAVENOUS | Status: DC
  Administered 2023-02-26 – 2023-03-07 (×18): 3 mL via INTRAVENOUS

## 2023-02-26 MED ORDER — LEVETIRACETAM IN NACL 500 MG/100ML IV SOLN
500.0000 mg | Freq: Two times a day (BID) | INTRAVENOUS | Status: DC
  Administered 2023-02-26 – 2023-03-01 (×7): 500 mg via INTRAVENOUS
  Filled 2023-02-26 (×7): qty 100

## 2023-02-26 MED ORDER — EPINEPHRINE HCL 5 MG/250ML IV SOLN IN NS
4.0000 ug/min | INTRAVENOUS | Status: DC
  Administered 2023-02-26: 4 ug/min via INTRAVENOUS

## 2023-02-26 MED ORDER — ORAL CARE MOUTH RINSE
15.0000 mL | OROMUCOSAL | Status: DC
  Administered 2023-02-27 (×4): 15 mL via OROMUCOSAL

## 2023-02-26 MED ORDER — LACTATED RINGERS IV SOLN: INTRAVENOUS | Status: AC

## 2023-02-26 MED ORDER — ORAL CARE MOUTH RINSE: 15.0000 mL | OROMUCOSAL | Status: DC | PRN

## 2023-02-26 MED ORDER — LACTATED RINGERS IV SOLN: INTRAVENOUS | Status: DC

## 2023-02-26 MED ORDER — DEXTROSE 50 % IV SOLN: 0.5000 g/kg | Freq: Once | INTRAVENOUS | Status: DC | PRN

## 2023-02-26 MED ORDER — DEXTROSE 10% IV SOLUTION (ML/KG/HR)
5.0000 mL/kg/h | INTRAVENOUS | Status: DC
  Filled 2023-02-26 (×10): qty 1000

## 2023-02-26 MED ORDER — SODIUM CHLORIDE 0.9 % IV SOLN
0.0000 [IU]/kg/h | INTRAVENOUS | Status: DC
  Filled 2023-02-26: qty 10

## 2023-02-26 MED ORDER — INSULIN HIGH DOSE BOLUS VIA INFUSION (FOR BETA BLOCKER / CALCIUM CHANNEL BLOCKER OVERDOSE)
1.0000 [IU]/kg | Freq: Once | INTRAVENOUS | Status: DC
  Filled 2023-02-26: qty 73

## 2023-02-26 MED ORDER — SODIUM CHLORIDE 0.9 % IV SOLN: 3.0000 g | Freq: Once | INTRAVENOUS | Status: DC

## 2023-02-26 MED ORDER — SODIUM CHLORIDE 0.9 % IV SOLN
510.0000 mg | INTRAVENOUS | Status: AC
  Administered 2023-02-26 – 2023-03-05 (×2): 510 mg via INTRAVENOUS
  Filled 2023-02-26 (×3): qty 17

## 2023-02-26 MED ORDER — LIDOCAINE HCL (PF) 1 % IJ SOLN
INTRAMUSCULAR | Status: AC
  Filled 2023-02-26: qty 30

## 2023-02-26 MED ORDER — ATROPINE SULFATE 1 MG/10ML IJ SOSY
PREFILLED_SYRINGE | INTRAMUSCULAR | Status: AC
  Administered 2023-02-26: 1 mg
  Filled 2023-02-26: qty 30

## 2023-02-26 SURGICAL SUPPLY — 10 items
CABLE ADAPT PACING TEMP 12FT (ADAPTER) IMPLANT
CATH S G BIP PACING (CATHETERS) IMPLANT
KIT MICROPUNCTURE NIT STIFF (SHEATH) IMPLANT
MAT PREVALON FULL STRYKER (MISCELLANEOUS) IMPLANT
PACK CARDIAC CATHETERIZATION (CUSTOM PROCEDURE TRAY) IMPLANT
PINNACLE LONG 6F 25CM (SHEATH) ×1 IMPLANT
SET ATX-X65L (MISCELLANEOUS) IMPLANT
SHEATH INTRO PINNACLE 6F 25CM (SHEATH) IMPLANT
SHEATH PROBE COVER 6X72 (BAG) IMPLANT
WIRE EMERALD 3MM-J .035X150CM (WIRE) IMPLANT

## 2023-02-26 NOTE — H&P (View-Only) (Signed)
 Cardiology Consult:   Patient ID: Brian [name]; MRN: [id number]; DOB: March 30, 1955   Admission date: 02/26/2023  Primary Care Provider: Duanne Butler DASEN, MD Primary Cardiologist: Hochrein   Chief Complaint:  CHB - symptomatic requiring PPM  Patient Profile:   Brian [name] is a 68 y.o. male with a history of AF, prior strokes with hx of conduction disease.  History of Present Illness:   Brian [name] a 68 year old with a history of chronic systolic heart failure, prior stroke, paroxysmal atrial fibrillation, and hypertension, presented with lethargy and altered mental status. He had a mechanical fall from a ladder at the last evaluation. The ejection fraction (EF) was 45%, improved from 20-25%, and remained stable. He had a trivial pericardial effusion, but potassium and magnesium  levels were within normal limits.  His daughter notes that at baseline he can talk and was reasonably mobile despite prior strokes.  He lost his mother recently.  With this, he lost his placed of residence.  He has had significant depression due to this, as per his daugther.   He was on Eliquis , atorvastatin , diltiazem  360, and high-dose metoprolol , despite a low EF. He was founds down with unknown pills. He was found to be in atrial flutter with 4:1 conduction and underlying heart block, a new development from previous atrial fibrillation with a bifascicular block. He had a history of hyperkalemia (5.1, 5.3) without any issues, but it was not suspected to be the cause of the current issues.  He had a new acute kidney injury, with a baseline creatinine of 1.5 and current levels between 2.8 and 3. He was in complete heart block and actively pacing. There were concerns about potential medication overdose. He was transcutaneously pacing at a rate of 70 and appeared to have an underlying atrial flutter.  He was not able to provide a full review of systems due to his mental state. He had a history of a prior  stroke with a little bit of hemiparesis but was usually talkative. He was less talkative at the time of the consultation.  Allergies:    Allergies  Allergen Reactions   Codeine Other (See Comments)    Unknown reaction, severe headach   Metformin  And Related Diarrhea    Social History:   Social History   Socioeconomic History   Marital status: Divorced    Spouse name: Not on file   Number of children: 2   Years of education: 12   Highest education level: 12th grade  Occupational History   Occupation: Disabled  Tobacco Use   Smoking status: Never    Passive exposure: Never   Smokeless tobacco: Never   Tobacco comments:    Verified by son, Aryon Nham  Vaping Use   Vaping status: Never Used  Substance and Sexual Activity   Alcohol  use: Yes    Alcohol /week: 0.0 standard drinks of alcohol     Comment: seldom   Drug use: Not Currently    Frequency: 7.0 times per week    Types: Marijuana    Comment: marijauna for arthritis, last 03-13-20   Sexual activity: Not Currently    Partners: Female  Other Topics Concern   Not on file  Social History Narrative   Two children.  5 grandchildren.     Social Drivers of Corporate Investment Banker Strain: Low Risk  (04/05/2022)   Overall Financial Resource Strain (CARDIA)    Difficulty of Paying Living Expenses: Not hard at all  Food Insecurity: No Food  Insecurity (01/07/2023)   Hunger Vital Sign    Worried About Running Out of Food in the Last Year: Never true    Ran Out of Food in the Last Year: Never true  Transportation Needs: No Transportation Needs (01/07/2023)   PRAPARE - Administrator, Civil Service (Medical): No    Lack of Transportation (Non-Medical): No  Physical Activity: Inactive (04/05/2022)   Exercise Vital Sign    Days of Exercise per Week: 0 days    Minutes of Exercise per Session: 0 min  Stress: No Stress Concern Present (04/05/2022)   [employer] of Occupational Health - Occupational Stress  Questionnaire    Feeling of Stress : Not at all  Social Connections: Socially Isolated (04/05/2022)   Social Connection and Isolation Panel [NHANES]    Frequency of Communication with Friends and Family: Twice a week    Frequency of Social Gatherings with Friends and Family: Never    Attends Religious Services: Never    Database Administrator or Organizations: No    Attends Banker Meetings: Never    Marital Status: Divorced  Catering Manager Violence: Not At Risk (01/07/2023)   Humiliation, Afraid, Rape, and Kick questionnaire    Fear of Current or Ex-Partner: No    Emotionally Abused: No    Physically Abused: No    Sexually Abused: No    Family History:   The patient's family history includes Breast cancer in his sister; Colon cancer in his mother; Hyperlipidemia in his father; Hypertension in his father. There is no history of Coronary artery disease, Stomach cancer, Rectal cancer, or Esophageal cancer.    ROS:  Please see the history of present illness.   Physical Exam/Data:   Vitals:   02/26/23 1533 02/26/23 1550 02/26/23 1635 02/26/23 1714  BP: 96/62 (!) 119/57 117/68   Pulse: (!) 39 68 (!) 51   Resp: 12 (!) 24 13   Temp: 97.6 F (36.4 C)     TempSrc: Axillary     SpO2: 100% 93% 96%   Weight:    72.6 kg   No intake or output data in the 24 hours ending 02/26/23 1729 Filed Weights   02/26/23 1714  Weight: 72.6 kg   Body mass index is 23.63 kg/m.   Gen: Obtunded  guarding his airway Neck: No JVD,  Ears:  No Dempsey Sign Cardiac: No Rubs or Gallops, no murmur, RRR +2 radial pulses Respiratory: Clear to auscultation bilaterally, normal effort, normal  respiratory rate GI: Soft, nontender, non-distended  MS: No  edema;  moves all extremities Integument: Skin feels warm  EKG:  The ECG that was done  was personally reviewed and demonstrates AF 4:1 with CHB  Relevant CV Studies:  Cardiac Studies & Procedures      ECHOCARDIOGRAM  ECHOCARDIOGRAM  COMPLETE 01/07/2023  Narrative ECHOCARDIOGRAM REPORT    Patient Name:   Brian [name] Date of Exam: 01/07/2023 Medical Rec #:  [id number]         Height:       69.0 in Accession #:    [id number]        Weight:       162.7 lb Date of Birth:  Aug 29, 1955          BSA:          1.892 m Patient Age:    67 years          BP:  125/92 mmHg Patient Gender: M                 HR:           108 bpm. Exam Location:  Inpatient  Procedure: 2D Echo, Color Doppler and Cardiac Doppler  Indications:    Pericardial Effusion  History:        Patient has prior history of Echocardiogram examinations, most recent 11/08/2021. CHF, Pericardial Disease, Stroke and Lung Cancer, Arrythmias:Atrial Fibrillation; Risk Factors:Diabetes and Dyslipidemia.  Sonographer:    Melissa Kafa Referring Phys: [id number] DAYNA N DUNN  IMPRESSIONS   1. Left ventricular ejection fraction, by estimation, is 45 to 50%. The left ventricle has mildly decreased function. The left ventricle has no regional wall motion abnormalities. There is moderate left ventricular hypertrophy. Left ventricular diastolic parameters are consistent with Grade II diastolic dysfunction (pseudonormalization). Elevated left atrial pressure. The E/e' is 21. 2. Right ventricular systolic function is mildly reduced. The right ventricular size is normal. 3. Left atrial size was moderately dilated. 4. Right atrial size was dilated. 5. The mitral valve is normal in structure. Trivial mitral valve regurgitation. No evidence of mitral stenosis. 6. The aortic valve is tricuspid. Aortic valve regurgitation is not visualized. Aortic valve sclerosis is present, with no evidence of aortic valve stenosis. 7. Aortic dilatation noted. There is dilatation of the ascending aorta, measuring 40 mm.  Comparison(s): A prior study was performed on 11/08/2021. LVEF previously noted to be 55-60% and now 45-50%. See report for details.  FINDINGS Left Ventricle: Left  ventricular ejection fraction, by estimation, is 45 to 50%. The left ventricle has mildly decreased function. The left ventricle has no regional wall motion abnormalities. The left ventricular internal cavity size was normal in size. There is moderate left ventricular hypertrophy. Left ventricular diastolic parameters are consistent with Grade II diastolic dysfunction (pseudonormalization). Elevated left atrial pressure. The E/e' is 21.  Right Ventricle: The right ventricular size is normal. No increase in right ventricular wall thickness. Right ventricular systolic function is mildly reduced.  Left Atrium: Left atrial size was moderately dilated.  Right Atrium: Right atrial size was dilated.  Pericardium: Trivial pericardial effusion is present.  Mitral Valve: The mitral valve is normal in structure. Trivial mitral valve regurgitation. No evidence of mitral valve stenosis.  Tricuspid Valve: The tricuspid valve is normal in structure. Tricuspid valve regurgitation is trivial. No evidence of tricuspid stenosis.  Aortic Valve: The aortic valve is tricuspid. Aortic valve regurgitation is not visualized. Aortic valve sclerosis is present, with no evidence of aortic valve stenosis. Aortic valve mean gradient measures 2.0 mmHg. Aortic valve peak gradient measures 3.7 mmHg. Aortic valve area, by VTI measures 2.32 cm.  Pulmonic Valve: The pulmonic valve was not well visualized. Pulmonic valve regurgitation is not visualized. No evidence of pulmonic stenosis.  Aorta: Aortic dilatation noted and the aortic root is normal in size and structure. There is dilatation of the ascending aorta, measuring 40 mm.  IAS/Shunts: The atrial septum is grossly normal.  Additional Comments: There is pleural effusion in the left lateral region.   LEFT VENTRICLE PLAX 2D LVIDd:         3.50 cm   Diastology LVIDs:         2.70 cm   LV e' medial:    5.22 cm/s LV PW:         1.40 cm   LV E/e' medial:  24.7 LV IVS:         1.40 cm  LV e' lateral:   7.72 cm/s LVOT diam:     2.10 cm   LV E/e' lateral: 16.7 LV SV:         38 LV SV Index:   20 LVOT Area:     3.46 cm   RIGHT VENTRICLE RV Basal diam:  3.40 cm RV Mid diam:    2.60 cm RV S prime:     6.74 cm/s TAPSE (M-mode): 1.2 cm  LEFT ATRIUM             Index        RIGHT ATRIUM           Index LA diam:        4.10 cm 2.17 cm/m   RA Area:     21.70 cm LA Vol (A2C):   80.0 ml 42.27 ml/m  RA Volume:   60.30 ml  31.86 ml/m LA Vol (A4C):   81.3 ml 42.96 ml/m LA Biplane Vol: 82.3 ml 43.49 ml/m AORTIC VALVE AV Area (Vmax):    2.38 cm AV Area (Vmean):   2.37 cm AV Area (VTI):     2.32 cm AV Vmax:           96.20 cm/s AV Vmean:          64.200 cm/s AV VTI:            0.166 m AV Peak Grad:      3.7 mmHg AV Mean Grad:      2.0 mmHg LVOT Vmax:         66.00 cm/s LVOT Vmean:        44.000 cm/s LVOT VTI:          0.111 m LVOT/AV VTI ratio: 0.67  AORTA Ao Root diam: 3.10 cm Ao Asc diam:  4.00 cm  MITRAL VALVE MV Area (PHT): 4.29 cm     SHUNTS MV Decel Time: 177 msec     Systemic VTI:  0.11 m MV E velocity: 129.00 cm/s  Systemic Diam: 2.10 cm MV A velocity: 46.10 cm/s MV E/A ratio:  2.80  Sunit Tolia Electronically signed by Madonna Large Signature Date/Time: 01/07/2023/5:11:42 PM    Final   MONITORS  CARDIAC EVENT MONITOR 03/16/2021           Laboratory Data:  Chemistry Recent Labs  Lab 02/26/23 1544 02/26/23 1551  NA 134* 134*  K 5.5* 5.6*  CL 104 105  CO2  --  20*  GLUCOSE 109* 114*  BUN 44* 52*  CREATININE 3.00* 2.84*  CALCIUM   --  8.3*  GFRNONAA  --  23*  ANIONGAP  --  9    Recent Labs  Lab 02/26/23 1551  PROT 5.3*  ALBUMIN 2.9*  AST 13*  ALT 18  ALKPHOS 60  BILITOT 0.6   Hematology Recent Labs  Lab 02/26/23 1544 02/26/23 1551  WBC  --  4.0  RBC  --  3.02*  HGB 8.5* 8.6*  HCT 25.0* 27.7*  MCV  --  91.7  MCH  --  28.5  MCHC  --  31.0  RDW  --  16.7*  PLT  --  130*   Cardiac EnzymesNo  results for input(s): TROPONINI in the last 168 hours. No results for input(s): TROPIPOC in the last 168 hours.  BNPNo results for input(s): BNP, PROBNP in the last 168 hours.  DDimer  Recent Labs  Lab 02/26/23 1551  DDIMER 0.86*     Assessment and Plan:   Complete Heart  Block Presented lethargic and altered with a ventricular rate of 37 bpm. Found to be in atrial flutter with 4:1 conduction and underlying heart block. Currently pacing transcutaneously at 70 bpm. Concerns for possible medication overdose due to suicidal ideation. Critical care team has discussed concerns with the patient. - Hold diltiazem  - Hold metoprolol  - Administer temporary wire under two physician consent; after transporting him to Cath Lab I called his daughter who gave consent - Consult advanced heart failure team now that 2H is closed (will go to CHF team due to 2H, he can come out to any cardiac team (including B) unless need PPM; then EP) - Contact patient's daughter - Coordinate with critical care team - he is requiring 4 Epi - will get echocardiogram    Paroxysmal Atrial Fibrillation Paroxysmal atrial fibrillation with current atrial flutter and biophysicular block. Currently on Eliquis , atorvastatin , diltiazem , and high dose metoprolol . Holding diltiazem  and metoprolol  due to current condition. - Monitor atrial flutter and biophysicular block - Hold diltiazem  - Hold metoprolol  - Unclear last AC given unknown medication intake - likely will need heparin   Chronic Combined Systolic Heart Failure Chronic systolic heart failure with an ejection fraction of 45%, improved from 20-25%. Currently on high dose metoprolol . Holding metoprolol  due to current condition. - Echo is pending  Hypertension Hypertension managed with diltiazem  and metoprolol . Holding diltiazem  and metoprolol  due to current condition. - Hold diltiazem  - Hold metoprolol   Acute Kidney Injury New acute kidney injury with  creatinine levels between 2.8 and 3, baseline 1.5 to 1.7. Potassium levels are 5.5 and 5.6. Possible contributing factor is high V8 dose. Administering potassium binders and calcium  to manage hyperkalemia. - Administer potassium binders - Administer calcium   Possible Suicidal Ideation Concerns for possible medication overdose due to suicidal ideation.  - Monitor for signs of medication overdose - at the very least, he may need psychiatry support when he is mentation better.   History of Stroke Prior stroke with residual hemiparesis. Normally talkative but currently less so. - Monitor neurological status.  CRITICAL CARE Performed by: Ulonda Klosowski A Kyrus Hyde  Total critical care time: 51 minutes. Critical care time was exclusive of separately billable procedures and treating other patients. Critical care was necessary to treat or prevent imminent or life-threatening deterioration. Critical care was time spent personally by me on the following activities: development of treatment plan with patient and/or surrogate as well as nursing, discussions with consultants (PCCM and AHF), evaluation of patient's response to treatment, examination of patient, obtaining history from patient or surrogate, ordering and performing treatments and interventions, ordering and review of laboratory studies, ordering and review of radiographic studies, pulse oximetry and re-evaluation of patient's condition' including and discussing care with his daughter who is coming up after they find temporary care for their son.   Signed, Stanly Leavens, MD FASE [facility] [place]  [facility]  02/26/2023 5:50 PM    For questions or updates, please contact [facility] Please consult [email or web address] for contact info under Cardiology/STEMI.   Stanly Leavens, MD FASE [facility] Cardiologist [facility]  [address] [place], [address] [place], [place] [address] [phone]  5:29 PM

## 2023-02-26 NOTE — ED Notes (Signed)
 Pacing started at [employer]

## 2023-02-26 NOTE — ED Triage Notes (Signed)
 Pt arrives via ems to the er from [facility] for the c/o staff finding pt lethargic in bed, then vomited and found capsule in vomit. Pt states he took 2 of them around 1400. Normally more alert than he is now. 2mg  atropine  given with 500ml NS, epi drip started @ 83mcg/min in a 1mg /269ml bag  VS PER EMS 20-40BPM 76/40 BP 99% 2L [place]

## 2023-02-26 NOTE — ED Provider Notes (Signed)
 [place] [facility]GENC[facility] DE[facility]MENT AT [facility] Provider Note   CSN: [id number] Arrival date & time: 02/26/23  1516     History  No chief complaint on file.   Brian [name] is a 68 y.o. male.  HPI   68 year old male with medical history significant for DM2, HTN, atrial fibrillation on both diltiazem  and metoprolol , anticoagulated on Eliquis , cardiomyopathy, prior CVA presenting to the emergency department with hypotension and shock.  The history was provided by EMS as the patient was unable to provide much history.  The patient was found at his facility [facility] lethargic in bed and vomiting with an unknown capsule medicine found in his vomit.  He had taken 2 of them around 1400.  He is normally more alert.  He was found to be bradycardic with heart rates in the 30s and was administered 2 mg of atropine  with EMS, administered 500 cc of normal saline and started on the epi drip at 4 mcg/min.  On arrival, the patient was hypotensive off of an epi drip, bradycardic.  Pacing pads were placed and the patient was administered 1 mg of atropine  without improvement and he was restarted on epinephrine .  He was then subsequently transcutaneously paced given his hypotension, bradycardia, change in mental status.  Home Medications Prior to Admission medications   Medication Sig Start Date End Date Taking? Authorizing Provider  apixaban  (ELIQUIS ) 5 MG TABS tablet Take 1 tablet (5 mg total) by mouth 2 (two) times daily. 10/16/22   Duanne Butler DASEN, MD  atorvastatin  (LIPITOR) 20 MG tablet Take 1 tablet (20 mg total) by mouth daily. 05/28/22   Duanne Butler DASEN, MD  Continuous Blood Gluc Sensor (FREESTYLE LIBRE 2 SENSOR) MISC Use as directed to monitor blood glucose continuously. Change sensor Q 14 days. DX E11.65 04/11/20   Duanne Butler DASEN, MD  diltiazem  (CARDIZEM  CD) 360 MG 24 hr capsule Take 1 capsule (360 mg total) by mouth daily. 01/10/23   Ghimire, Donalda HERO, MD  empagliflozin   (JARDIANCE ) 25 MG TABS tablet Take 1 tablet (25 mg total) by mouth daily before breakfast. 10/19/22   Duanne Butler DASEN, MD  furosemide  (LASIX ) 40 MG tablet Take 40 mg by mouth as needed for fluid or edema.    [provider]  glucose blood test strip 1 each by Other route as needed for other. Use as instructed 03/10/20   Duanne Butler DASEN, MD  hydrALAZINE  (APRESOLINE ) 50 MG tablet Take 1 tablet (50 mg total) by mouth 3 (three) times daily. 01/01/23   Duanne Butler DASEN, MD  insulin  aspart (NOVOLOG  FLEXPEN) 100 UNIT/ML FlexPen 0-9 Units, Subcutaneous, 3 times daily with meals CBG < 70: Implement Hypoglycemia measures CBG 70 - 120: 0 units CBG 121 - 150: 1 unit CBG 151 - 200: 2 units CBG 201 - 250: 3 units CBG 251 - 300: 5 units CBG 301 - 350: 7 units CBG 351 - 400: 9 units CBG > 400: call MD 01/10/23   Raenelle Donalda HERO, MD  levETIRAcetam  (KEPPRA ) 500 MG tablet Take 1 tablet (500 mg total) by mouth 2 (two) times daily. 01/10/23   Ghimire, Donalda HERO, MD  losartan  (COZAAR ) 100 MG tablet Take 0.5 tablets (50 mg total) by mouth daily. 01/10/23   Ghimire, Donalda HERO, MD  MAGNESIUM  PO Take 1 tablet by mouth daily.    [provider]  metoprolol  succinate (TOPROL -XL) 200 MG 24 hr tablet Take 1 tablet (200 mg total) by mouth daily. Take with or immediately  following a meal. 01/10/23   Ghimire, Donalda HERO, MD      Allergies    Codeine and Metformin  and related    Review of Systems   Review of Systems  Unable to perform ROS: Acuity of condition    Physical Exam Updated Vital Signs BP 117/68   Pulse (!) 51   Temp 97.6 F (36.4 C) (Axillary)   Resp 13   Wt 72.6 kg   SpO2 90%   BMI 23.63 kg/m  Physical Exam Vitals and nursing note reviewed.  Constitutional:      General: He is not in acute distress.    Comments: GCS 11 (2-4-5)  HENT:     Head: Normocephalic and atraumatic.  Eyes:     Conjunctiva/sclera: Conjunctivae normal.     Pupils: Pupils are equal, round, and reactive to  light.  Cardiovascular:     Rate and Rhythm: Regular rhythm. Bradycardia present.     Pulses: Normal pulses.  Pulmonary:     Effort: Pulmonary effort is normal. No respiratory distress.  Abdominal:     General: There is no distension.     Tenderness: There is no abdominal tenderness. There is no guarding.  Musculoskeletal:        General: No deformity or signs of injury.     Cervical back: Neck supple.  Skin:    Findings: No lesion or rash.  Neurological:     General: No focal deficit present.     Mental Status: He is alert. Mental status is at baseline.     Cranial Nerves: No cranial nerve deficit.     Sensory: No sensory deficit.     Motor: No weakness.     Comments: Neuro exam nonfocal moving all four extremities in response to painful stimuli     ED Results / Procedures / Treatments   Labs (all labs ordered are listed, but only abnormal results are displayed) Labs Reviewed  CBC WITH DIFFERENTIAL/PLATELET - Abnormal; Notable for the following components:      Result Value   RBC 3.02 (*)    Hemoglobin 8.6 (*)    HCT 27.7 (*)    RDW 16.7 (*)    Platelets 130 (*)    Lymphs Abs 0.4 (*)    All other components within normal limits  COMPREHENSIVE METABOLIC PANEL - Abnormal; Notable for the following components:   Sodium 134 (*)    Potassium 5.6 (*)    CO2 20 (*)    Glucose, Bld 114 (*)    BUN 52 (*)    Creatinine, Ser 2.84 (*)    Calcium  8.3 (*)    Total Protein 5.3 (*)    Albumin 2.9 (*)    AST 13 (*)    GFR, Estimated 23 (*)    All other components within normal limits  D-DIMER, QUANTITATIVE - Abnormal; Notable for the following components:   D-Dimer, Quant 0.86 (*)    All other components within normal limits  CBG MONITORING, ED - Abnormal; Notable for the following components:   Glucose-Capillary 106 (*)    All other components within normal limits  I-STAT CHEM 8, ED - Abnormal; Notable for the following components:   Sodium 134 (*)    Potassium 5.5 (*)     BUN 44 (*)    Creatinine, Ser 3.00 (*)    Glucose, Bld 109 (*)    Calcium , Ion 1.13 (*)    Hemoglobin 8.5 (*)    HCT 25.0 (*)  All other components within normal limits  T4, FREE  TSH  BRAIN NATRIURETIC PEPTIDE  MAGNESIUM   PROCALCITONIN  RAPID URINE DRUG SCREEN, HOSP PERFORMED  URINALYSIS, ROUTINE W REFLEX MICROSCOPIC  CBC  MAGNESIUM   PHOSPHORUS  BLOOD GAS, VENOUS  I-STAT CG4 LACTIC ACID, ED  I-STAT CG4 LACTIC ACID, ED  TROPONIN I (HIGH SENSITIVITY)  TROPONIN I (HIGH SENSITIVITY)    EKG EKG Interpretation Date/Time:  Tuesday February 26 2023 15:29:58 EST Ventricular Rate:  37 PR Interval:    QRS Duration:  165 QT Interval:  595 QTC Calculation: 467 R Axis:   265  Text Interpretation: Atrial flutter Concern for complete heart block Right bundle branch block Inferior infarct, old Reconfirmed by Jerrol Agent ([id number]) on 02/26/2023 6:02:24 PM  Radiology CT HEAD WO CONTRAST ( ) Result Date: 02/26/2023 CLINICAL DATA:  Mental status change, unknown cause EXAM: CT HEAD WITHOUT CONTRAST TECHNIQUE: Contiguous axial images were obtained from the base of the skull through the vertex without intravenous contrast. RADIATION DOSE REDUCTION: This exam was performed according to the departmental dose-optimization program which includes automated exposure control, adjustment of the mA and/or kV according to patient size and/or use of iterative reconstruction technique. COMPARISON:  CT head and MRI head January 07, 2023. FINDINGS: Brain: Similar remote bilateral parieto-occipital infarcts. Remote left basal ganglia infarct. Advanced patchy white matter hypodensities, compatible with chronic microvascular ischemic disease. Vascular: Calcific atherosclerosis. No hyperdense vessel identified. Skull: No acute fracture. Sinuses/Orbits: Clear sinuses.  No acute orbital findings. Other: Right mastoid effusion, similar. IMPRESSION: 1. No evidence of acute intracranial abnormality. MRI could provide more  sensitive evaluation for acute infarct if clinically warranted. 2. Remote infarcts and advanced chronic microvascular ischemic change. 3. Chronic right mastoid effusion. Electronically Signed   By: Gilmore GORMAN Molt M.D.   On: 02/26/2023 17:29   DG Chest Portable 1 View Result Date: 02/26/2023 CLINICAL DATA:  Bradycardia, hypotension. EXAM: PORTABLE CHEST 1 VIEW COMPARISON:  Chest x-ray 01/07/2023 FINDINGS: Heart is enlarged, unchanged. Evaluation of the left lower lung is obscured by overlying external artifact. There is likely a small left pleural effusion. Right lung is clear. No pneumothorax visualized. No acute osseous abnormality. IMPRESSION: 1. Evaluation of the left lower lung is obscured by overlying external artifact. There is likely a small left pleural effusion. 2. Cardiomegaly. Electronically Signed   By: Greig Pique M.D.   On: 02/26/2023 16:16    Procedures External pacer  Date/Time: 02/26/2023 4:08 PM  Performed by: Jerrol Agent, MD Authorized by: Jerrol Agent, MD  Consent: The procedure was performed in an emergent situation. Required items: required blood products, implants, devices, and special equipment available Patient identity confirmed: arm band Time out: Immediately prior to procedure a time out was called to verify the correct patient, procedure, equipment, support staff and site/side marked as required. Preparation: Patient was prepped and draped in the usual sterile fashion.  Sedation: Patient sedated: no  Patient tolerance: patient tolerated the procedure well with no immediate complications   .Critical Care  Performed by: Jerrol Agent, MD Authorized by: Jerrol Agent, MD   Critical care provider statement:    Critical care time (minutes):  110   Critical care was necessary to treat or prevent imminent or life-threatening deterioration of the following conditions:  Shock   Critical care was time spent personally by me on the following activities:   Development of treatment plan with patient or surrogate, discussions with consultants, evaluation of patient's response to treatment, examination of patient, ordering and review of  laboratory studies, ordering and review of radiographic studies, ordering and performing treatments and interventions, pulse oximetry, re-evaluation of patient's condition and review of old charts   Care discussed with: admitting provider       Medications Ordered in ED Medications  fentaNYL  (SUBLIMAZE ) injection 50 mcg ( Intravenous MAR Hold 02/26/23 1726)  EPINEPHrine  (ADRENALIN ) 5 mg in NS 250 mL (0.02 mg/mL) premix infusion (2 mcg/min Intravenous Rate/Dose Change 02/26/23 1753)  insulin  HIGH DOSE bolus via infusion (for beta blocker / calcium  channel blocker overdose) ( Intravenous Automatically Held 02/26/23 1745)  insulin  HIGH DOSE (4 units/mL) infusion for beta blocker/calcium  channel blocker overdose ( Intravenous MAR Hold 02/26/23 1726)  dextrose  50 % solution 36.3 g ( Intravenous MAR Hold 02/26/23 1726)  dextrose  10 % infusion (has no administration in time range)  lidocaine  (PF) (XYLOCAINE ) 1 % injection (10 mLs Infiltration Given 02/26/23 1743)  atropine  1 MG/10ML injection (1 mg  Given 02/26/23 1531)  calcium  chloride injection 1 g (1 g Intravenous Given 02/26/23 1615)    ED Course/ Medical Decision Making/ A&P                                 Medical Decision Making Amount and/or Complexity of Data Reviewed Labs: ordered. Radiology: ordered.  Risk Prescription drug management. Decision regarding hospitalization.    68 year old male with medical history significant for DM2, HTN, atrial fibrillation on both diltiazem  and metoprolol , anticoagulated on Eliquis , cardiomyopathy, prior CVA presenting to the emergency department with hypotension and shock.  The history was provided by EMS as the patient was unable to provide much history.  The patient was found at his facility [facility] lethargic in bed and  vomiting with an unknown capsule medicine found in his vomit.  He had taken 2 of them around 1400.  He is normally more alert.  He was found to be bradycardic with heart rates in the 30s and was administered 2 mg of atropine  with EMS, administered 500 cc of normal saline and started on the epi drip at 4 mcg/min.  On arrival, the patient was hypotensive off of an epi drip, bradycardic.  Pacing pads were placed and the patient was administered 1 mg of atropine  without improvement and he was restarted on epinephrine .  He was then subsequently transcutaneously paced given his hypotension, bradycardia, change in mental status.  The patient was started on transcutaneous pacing and administered a 1 L IV fluid bolus.  He was started on epinephrine  infusion at 2 mcg/min.  Screening laboratory evaluation revealed a CBG of 106, CBC without a leukocytosis, anemia to 8.6, appears to be at the patient's baseline, lactic acid normal at 0.7, patient was notably hyperkalemic to 5.6, a mild non-anion gap acidosis with a bicarbonate of 20, anion gap of 9, evidence of an AKI with a serum creatinine of 2.84.   Considered BRASH in the setting of the patient's home calcium  channel blocker, ARB and beta-blocker.  Consider beta-blocker overdose although over this seems less likely given lack of consistent history with this and the patient is at a facility where medications are administered and monitored.  The patient required transcutaneous cardiac pacing to maintain his blood pressure and mental status.  Remaining laboratory evaluation revealed free T4 that was normal, cardiac troponin 12, D-dimer elevated after age adjustment at 0.86.  Given the patient's AKI, CTA PE study cannot be performed, VQ scan was ordered.  The patient's EKG confirmed  bradycardia, underlying atrial flutter noted, heart rates 37, no acute ischemic changes.  Concern for complete heart block vs medication toxicity in the setting of an AKI.    A chest x-ray  revealed: IMPRESSION:  1. Evaluation of the left lower lung is obscured by overlying  external artifact. There is likely a small left pleural effusion.  2. Cardiomegaly.    CT Head: IMPRESSION:  1. No evidence of acute intracranial abnormality. MRI could provide  more sensitive evaluation for acute infarct if clinically warranted.  2. Remote infarcts and advanced chronic microvascular ischemic  change.  3. Chronic right mastoid effusion.   Given the patient's bradycardia, home beta-blocker and calcium  channel blocker usage which appears to be high dosages, calcium  gluconate was also obtained.  Pulmonary critical care was consulted for admission and cardiology was consulted.  With Dr. Santo who will see the patient in consultation.  High-dose insulin  considered however will hold on high-dose insulin  at this time given no clear history of beta-blocker overdose inpatient at a facility where medication administration is monitored.  Pulmonary critical care evaluated the patient bedside and subsequently accepted the patient in admission in critical condition.  Final Clinical Impression(s) / ED Diagnoses Final diagnoses:  Bradycardia with 31-40 beats per minute  Shock (HCC)  AKI (acute kidney injury) (HCC)  Hyperkalemia  CHB (complete heart block) [facility])    Rx / DC Orders ED Discharge Orders     None         Jerrol Agent, MD 02/26/23 [phone]

## 2023-02-26 NOTE — Consult Note (Signed)
 Cardiology Consult:   Patient ID: Brian [name]; MRN: [id number]; DOB: March 30, 1955   Admission date: 02/26/2023  Primary Care Provider: Duanne Butler DASEN, MD Primary Cardiologist: Brian [name]   Chief Complaint:  CHB - symptomatic requiring PPM  Patient Profile:   Brian [name] is a 68 y.o. male with a history of AF, prior strokes with hx of conduction disease.  History of Present Illness:   Brian [name] a 68 year old with a history of chronic systolic heart failure, prior stroke, paroxysmal atrial fibrillation, and hypertension, presented with lethargy and altered mental status. He had a mechanical fall from a ladder at the last evaluation. The ejection fraction (EF) was 45%, improved from 20-25%, and remained stable. He had a trivial pericardial effusion, but potassium and magnesium  levels were within normal limits.  His daughter notes that at baseline he can talk and was reasonably mobile despite prior strokes.  He lost his mother recently.  With this, he lost his placed of residence.  He has had significant depression due to this, as per his daugther.   He was on Eliquis , atorvastatin , diltiazem  360, and high-dose metoprolol , despite a low EF. He was founds down with unknown pills. He was found to be in atrial flutter with 4:1 conduction and underlying heart block, a new development from previous atrial fibrillation with a bifascicular block. He had a history of hyperkalemia (5.1, 5.3) without any issues, but it was not suspected to be the cause of the current issues.  He had a new acute kidney injury, with a baseline creatinine of 1.5 and current levels between 2.8 and 3. He was in complete heart block and actively pacing. There were concerns about potential medication overdose. He was transcutaneously pacing at a rate of 70 and appeared to have an underlying atrial flutter.  He was not able to provide a full review of systems due to his mental state. He had a history of a prior  stroke with a little bit of hemiparesis but was usually talkative. He was less talkative at the time of the consultation.  Allergies:    Allergies  Allergen Reactions   Codeine Other (See Comments)    Unknown reaction, severe headach   Metformin  And Related Diarrhea    Social History:   Social History   Socioeconomic History   Marital status: Divorced    Spouse name: Not on file   Number of children: 2   Years of education: 12   Highest education level: 12th grade  Occupational History   Occupation: Disabled  Tobacco Use   Smoking status: Never    Passive exposure: Never   Smokeless tobacco: Never   Tobacco comments:    Verified by son, Brian [name]  Vaping Use   Vaping status: Never Used  Substance and Sexual Activity   Alcohol  use: Yes    Alcohol /week: 0.0 standard drinks of alcohol     Comment: seldom   Drug use: Not Currently    Frequency: 7.0 times per week    Types: Marijuana    Comment: marijauna for arthritis, last 03-13-20   Sexual activity: Not Currently    Partners: Female  Other Topics Concern   Not on file  Social History Narrative   Two children.  5 grandchildren.     Social Drivers of Corporate Investment Banker Strain: Low Risk  (04/05/2022)   Overall Financial Resource Strain (CARDIA)    Difficulty of Paying Living Expenses: Not hard at all  Food Insecurity: No Food  Insecurity (01/07/2023)   Hunger Vital Sign    Worried About Running Out of Food in the Last Year: Never true    Ran Out of Food in the Last Year: Never true  Transportation Needs: No Transportation Needs (01/07/2023)   PRAPARE - Administrator, Civil Service (Medical): No    Lack of Transportation (Non-Medical): No  Physical Activity: Inactive (04/05/2022)   Exercise Vital Sign    Days of Exercise per Week: 0 days    Minutes of Exercise per Session: 0 min  Stress: No Stress Concern Present (04/05/2022)   [employer] of Occupational Health - Occupational Stress  Questionnaire    Feeling of Stress : Not at all  Social Connections: Socially Isolated (04/05/2022)   Social Connection and Isolation Panel [NHANES]    Frequency of Communication with Friends and Family: Twice a week    Frequency of Social Gatherings with Friends and Family: Never    Attends Religious Services: Never    Database Administrator or Organizations: No    Attends Banker Meetings: Never    Marital Status: Divorced  Catering Manager Violence: Not At Risk (01/07/2023)   Humiliation, Afraid, Rape, and Kick questionnaire    Fear of Current or Ex-Partner: No    Emotionally Abused: No    Physically Abused: No    Sexually Abused: No    Family History:   The patient's family history includes Breast cancer in his sister; Colon cancer in his mother; Hyperlipidemia in his father; Hypertension in his father. There is no history of Coronary artery disease, Stomach cancer, Rectal cancer, or Esophageal cancer.    ROS:  Please see the history of present illness.   Physical Exam/Data:   Vitals:   02/26/23 1533 02/26/23 1550 02/26/23 1635 02/26/23 1714  BP: 96/62 (!) 119/57 117/68   Pulse: (!) 39 68 (!) 51   Resp: 12 (!) 24 13   Temp: 97.6 F (36.4 C)     TempSrc: Axillary     SpO2: 100% 93% 96%   Weight:    72.6 kg   No intake or output data in the 24 hours ending 02/26/23 1729 Filed Weights   02/26/23 1714  Weight: 72.6 kg   Body mass index is 23.63 kg/m.   Gen: Obtunded  guarding his airway Neck: No JVD,  Ears:  No Dempsey Sign Cardiac: No Rubs or Gallops, no murmur, RRR +2 radial pulses Respiratory: Clear to auscultation bilaterally, normal effort, normal  respiratory rate GI: Soft, nontender, non-distended  MS: No  edema;  moves all extremities Integument: Skin feels warm  EKG:  The ECG that was done  was personally reviewed and demonstrates AF 4:1 with CHB  Relevant CV Studies:  Cardiac Studies & Procedures      ECHOCARDIOGRAM  ECHOCARDIOGRAM  COMPLETE 01/07/2023  Narrative ECHOCARDIOGRAM REPORT    Patient Name:   Brian [name] Date of Exam: 01/07/2023 Medical Rec #:  [id number]         Height:       69.0 in Accession #:    [id number]        Weight:       162.7 lb Date of Birth:  Aug 29, 1955          BSA:          1.892 m Patient Age:    67 years          BP:  125/92 mmHg Patient Gender: M                 HR:           108 bpm. Exam Location:  Inpatient  Procedure: 2D Echo, Color Doppler and Cardiac Doppler  Indications:    Pericardial Effusion  History:        Patient has prior history of Echocardiogram examinations, most recent 11/08/2021. CHF, Pericardial Disease, Stroke and Lung Cancer, Arrythmias:Atrial Fibrillation; Risk Factors:Diabetes and Dyslipidemia.  Sonographer:    Melissa Kafa Referring Phys: [id number] DAYNA N DUNN  IMPRESSIONS   1. Left ventricular ejection fraction, by estimation, is 45 to 50%. The left ventricle has mildly decreased function. The left ventricle has no regional wall motion abnormalities. There is moderate left ventricular hypertrophy. Left ventricular diastolic parameters are consistent with Grade II diastolic dysfunction (pseudonormalization). Elevated left atrial pressure. The E/e' is 21. 2. Right ventricular systolic function is mildly reduced. The right ventricular size is normal. 3. Left atrial size was moderately dilated. 4. Right atrial size was dilated. 5. The mitral valve is normal in structure. Trivial mitral valve regurgitation. No evidence of mitral stenosis. 6. The aortic valve is tricuspid. Aortic valve regurgitation is not visualized. Aortic valve sclerosis is present, with no evidence of aortic valve stenosis. 7. Aortic dilatation noted. There is dilatation of the ascending aorta, measuring 40 mm.  Comparison(s): A prior study was performed on 11/08/2021. LVEF previously noted to be 55-60% and now 45-50%. See report for details.  FINDINGS Left Ventricle: Left  ventricular ejection fraction, by estimation, is 45 to 50%. The left ventricle has mildly decreased function. The left ventricle has no regional wall motion abnormalities. The left ventricular internal cavity size was normal in size. There is moderate left ventricular hypertrophy. Left ventricular diastolic parameters are consistent with Grade II diastolic dysfunction (pseudonormalization). Elevated left atrial pressure. The E/e' is 21.  Right Ventricle: The right ventricular size is normal. No increase in right ventricular wall thickness. Right ventricular systolic function is mildly reduced.  Left Atrium: Left atrial size was moderately dilated.  Right Atrium: Right atrial size was dilated.  Pericardium: Trivial pericardial effusion is present.  Mitral Valve: The mitral valve is normal in structure. Trivial mitral valve regurgitation. No evidence of mitral valve stenosis.  Tricuspid Valve: The tricuspid valve is normal in structure. Tricuspid valve regurgitation is trivial. No evidence of tricuspid stenosis.  Aortic Valve: The aortic valve is tricuspid. Aortic valve regurgitation is not visualized. Aortic valve sclerosis is present, with no evidence of aortic valve stenosis. Aortic valve mean gradient measures 2.0 mmHg. Aortic valve peak gradient measures 3.7 mmHg. Aortic valve area, by VTI measures 2.32 cm.  Pulmonic Valve: The pulmonic valve was not well visualized. Pulmonic valve regurgitation is not visualized. No evidence of pulmonic stenosis.  Aorta: Aortic dilatation noted and the aortic root is normal in size and structure. There is dilatation of the ascending aorta, measuring 40 mm.  IAS/Shunts: The atrial septum is grossly normal.  Additional Comments: There is pleural effusion in the left lateral region.   LEFT VENTRICLE PLAX 2D LVIDd:         3.50 cm   Diastology LVIDs:         2.70 cm   LV e' medial:    5.22 cm/s LV PW:         1.40 cm   LV E/e' medial:  24.7 LV IVS:         1.40 cm  LV e' lateral:   7.72 cm/s LVOT diam:     2.10 cm   LV E/e' lateral: 16.7 LV SV:         38 LV SV Index:   20 LVOT Area:     3.46 cm   RIGHT VENTRICLE RV Basal diam:  3.40 cm RV Mid diam:    2.60 cm RV S prime:     6.74 cm/s TAPSE (M-mode): 1.2 cm  LEFT ATRIUM             Index        RIGHT ATRIUM           Index LA diam:        4.10 cm 2.17 cm/m   RA Area:     21.70 cm LA Vol (A2C):   80.0 ml 42.27 ml/m  RA Volume:   60.30 ml  31.86 ml/m LA Vol (A4C):   81.3 ml 42.96 ml/m LA Biplane Vol: 82.3 ml 43.49 ml/m AORTIC VALVE AV Area (Vmax):    2.38 cm AV Area (Vmean):   2.37 cm AV Area (VTI):     2.32 cm AV Vmax:           96.20 cm/s AV Vmean:          64.200 cm/s AV VTI:            0.166 m AV Peak Grad:      3.7 mmHg AV Mean Grad:      2.0 mmHg LVOT Vmax:         66.00 cm/s LVOT Vmean:        44.000 cm/s LVOT VTI:          0.111 m LVOT/AV VTI ratio: 0.67  AORTA Ao Root diam: 3.10 cm Ao Asc diam:  4.00 cm  MITRAL VALVE MV Area (PHT): 4.29 cm     SHUNTS MV Decel Time: 177 msec     Systemic VTI:  0.11 m MV E velocity: 129.00 cm/s  Systemic Diam: 2.10 cm MV A velocity: 46.10 cm/s MV E/A ratio:  2.80  Brian [name] Electronically signed by Madonna Large Signature Date/Time: 01/07/2023/5:11:42 PM    Final   MONITORS  CARDIAC EVENT MONITOR 03/16/2021           Laboratory Data:  Chemistry Recent Labs  Lab 02/26/23 1544 02/26/23 1551  NA 134* 134*  K 5.5* 5.6*  CL 104 105  CO2  --  20*  GLUCOSE 109* 114*  BUN 44* 52*  CREATININE 3.00* 2.84*  CALCIUM   --  8.3*  GFRNONAA  --  23*  ANIONGAP  --  9    Recent Labs  Lab 02/26/23 1551  PROT 5.3*  ALBUMIN 2.9*  AST 13*  ALT 18  ALKPHOS 60  BILITOT 0.6   Hematology Recent Labs  Lab 02/26/23 1544 02/26/23 1551  WBC  --  4.0  RBC  --  3.02*  HGB 8.5* 8.6*  HCT 25.0* 27.7*  MCV  --  91.7  MCH  --  28.5  MCHC  --  31.0  RDW  --  16.7*  PLT  --  130*   Cardiac EnzymesNo  results for input(s): TROPONINI in the last 168 hours. No results for input(s): TROPIPOC in the last 168 hours.  BNPNo results for input(s): BNP, PROBNP in the last 168 hours.  DDimer  Recent Labs  Lab 02/26/23 1551  DDIMER 0.86*     Assessment and Plan:   Complete Heart  Block Presented lethargic and altered with a ventricular rate of 37 bpm. Found to be in atrial flutter with 4:1 conduction and underlying heart block. Currently pacing transcutaneously at 70 bpm. Concerns for possible medication overdose due to suicidal ideation. Critical care team has discussed concerns with the patient. - Hold diltiazem  - Hold metoprolol  - Administer temporary wire under two physician consent; after transporting him to Cath Lab I called his daughter who gave consent - Consult advanced heart failure team now that 2H is closed (will go to CHF team due to 2H, he can come out to any cardiac team (including B) unless need PPM; then EP) - Contact patient's daughter - Coordinate with critical care team - he is requiring 4 Epi - will get echocardiogram    Paroxysmal Atrial Fibrillation Paroxysmal atrial fibrillation with current atrial flutter and biophysicular block. Currently on Eliquis , atorvastatin , diltiazem , and high dose metoprolol . Holding diltiazem  and metoprolol  due to current condition. - Monitor atrial flutter and biophysicular block - Hold diltiazem  - Hold metoprolol  - Unclear last AC given unknown medication intake - likely will need heparin   Chronic Combined Systolic Heart Failure Chronic systolic heart failure with an ejection fraction of 45%, improved from 20-25%. Currently on high dose metoprolol . Holding metoprolol  due to current condition. - Echo is pending  Hypertension Hypertension managed with diltiazem  and metoprolol . Holding diltiazem  and metoprolol  due to current condition. - Hold diltiazem  - Hold metoprolol   Acute Kidney Injury New acute kidney injury with  creatinine levels between 2.8 and 3, baseline 1.5 to 1.7. Potassium levels are 5.5 and 5.6. Possible contributing factor is high V8 dose. Administering potassium binders and calcium  to manage hyperkalemia. - Administer potassium binders - Administer calcium   Possible Suicidal Ideation Concerns for possible medication overdose due to suicidal ideation.  - Monitor for signs of medication overdose - at the very least, he may need psychiatry support when he is mentation better.   History of Stroke Prior stroke with residual hemiparesis. Normally talkative but currently less so. - Monitor neurological status.  CRITICAL CARE Performed by: Ulonda Klosowski A Kyrus Hyde  Total critical care time: 51 minutes. Critical care time was exclusive of separately billable procedures and treating other patients. Critical care was necessary to treat or prevent imminent or life-threatening deterioration. Critical care was time spent personally by me on the following activities: development of treatment plan with patient and/or surrogate as well as nursing, discussions with consultants (PCCM and AHF), evaluation of patient's response to treatment, examination of patient, obtaining history from patient or surrogate, ordering and performing treatments and interventions, ordering and review of laboratory studies, ordering and review of radiographic studies, pulse oximetry and re-evaluation of patient's condition' including and discussing care with his daughter who is coming up after they find temporary care for their son.   Signed, Stanly Leavens, MD FASE [facility] [place]  [facility]  02/26/2023 5:50 PM    For questions or updates, please contact [facility] Please consult [email or web address] for contact info under Cardiology/STEMI.   Stanly Leavens, MD FASE [facility] Cardiologist [facility]  [address] [place], [address] [place], [place] [address] [phone]  5:29 PM

## 2023-02-26 NOTE — CV Procedure (Addendum)
      TEMPORARY TRANSVENOUS PACEMAKER NOTE  Brian [name] [id number] 02/26/2023  Surgeon: Alm Clay  Procedure(s): Temporary transvenous pacemaker placement; Cardiac Fluoroscopy  Indications: complete heart block (acquired), atrial fibrillation with slow ventriculat response rate, concern for medication overdose  Consent:  The risks, benefits, complications, treatment options, and expected outcomes were discussed with the patient. The patient and/or family concurred with the proposed plan, giving informed consent. Emergency Consent implied --> Pt unable to consent due to altered level of consciousness.  Procedure Details:  After procedural and radiation safety time-outs were performed, the patient was prepped and draped in the usual sterile fashion. The Right femoral vein was identified and the overlying skin was anesthetized with 1% lidocaine .   Access: Right Femoral Artery 6 Fr Sheath, modified Seldinger Technique with micropuncture kit  Direct ultrasound guidance used.  Permanent image obtained and placed on chart. The sheath was flushed  5 Fr Temporary Transvenous Pacemaker Wire was advanced under fluoroscopic guidance into the RV until electronic capture noted.  Position of the wire was noted & wire locked into place.  Sterile cover advanced. After threshold levels were established, Back-up pacing Rate 80 bpm & Output 5 mA were set  The sheath was then sutured into place and the pacemaker wire was secured.   Estimated Blood Loss:  Minimal       Complications:  None; patient tolerated the procedure well.  CXR: Post-operative chest x-ray will be ordered to ensure adequate position of the pacemaker wire which is visualized extending into the apex of the right ventricle.           Disposition/Condition: PACU - guarded condition.           Alm Clay, M.D., M.S. Interventional Cardiologist   Pager # [phone] Phone # [phone] [address] [place], [place] [address]

## 2023-02-26 NOTE — Interval H&P Note (Signed)
 History and Physical Interval Note:  02/26/2023 5:55 PM  Brian [name]  has presented today for surgery, with the diagnosis of bradicardia.  The various methods of treatment have been discussed with the patient and family. After consideration of risks, benefits and other options for treatment, the patient has consented to  Procedure(s): TEMPORARY PACEMAKER (N/A) as a surgical intervention.  The patient's history has been reviewed, patient examined, no change in status, stable for surgery.  I have reviewed the patient's chart and labs.  Questions were answered to the patient's satisfaction.     Brian Clay

## 2023-02-26 NOTE — Progress Notes (Signed)
 PHARMACY - ANTICOAGULATION CONSULT NOTE  Pharmacy Consult for Heparin  Indication: atrial fibrillation  Allergies  Allergen Reactions   Codeine Other (See Comments)    Unknown reaction, severe headach   Metformin  And Related Diarrhea    Patient Measurements: Weight: 72.6 kg (160 lb) Heparin  Dosing Weight: 73 kg  Vital Signs: Temp: 93.9 F (34.4 C) (01/07 1852) Temp Source: Axillary (01/07 1852) BP: 131/82 (01/07 1830) Pulse Rate: 80 (01/07 1830)  Labs: Recent Labs    02/26/23 1544 02/26/23 1551  HGB 8.5* 8.6*  HCT 25.0* 27.7*  PLT  --  130*  CREATININE 3.00* 2.84*  TROPONINIHS  --  12    Estimated Creatinine Clearance: 24.5 mL/min (A) (by C-G formula based on SCr of 2.84 mg/dL (H)).   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 Alvia, laser therapy OD   Seizures (HCC)    pt reports this is from low blood sugar    Medications:  Awaiting home med rec  Assessment: 68 y.o. M presented from [facility] with lethargy and then vomiting and long black capsule found in vomit - unknown capsule as it has zero markings and seems longer than a normal capsule. Pt with brady requiring temporary pacemaker. Pt was on apixaban  pta for afib - unsure of last dose. CT negative for acute findings.To hold apixaban  and start heparin . Apixaban  will be affecting heparin  levels so will utilize aPTT for monitoring. Hgb 8.6 (8-10s past 2 months), plt 130.  Goal of Therapy:  Heparin  level 0.3-0.7 units/ml aPTT 66-102 seconds Monitor platelets by anticoagulation protocol: Yes   Plan:  Heparin  gtt at 1050 units/hr. No bolus with likely recent apixaban  use. Will f/u heparin  level and aPTT in 8  hours Daily heparin  level, aPTT, and CBC  Vito Ralph, PharmD, BCPS Please see amion for complete clinical pharmacist phone list 02/26/2023,7:08 PM

## 2023-02-26 NOTE — Plan of Care (Signed)
 Brian [name] is a 68 year old male with chronic systolic heart failure, history of stroke, paroxysmal atrial fibrillation and hypertension currently being admitted to the CVICU for complete heart block, altered mental status and lethargy secondary to possible intentional drug overdose.  Based on chart review, at baseline patient can talk and perform ADLs independently.  He has become increasingly depressed as he lost his place of residence.  Due to this concern for intentional overdose/suicidal ideation.  EKG in the emergency department with atrial flutter with underlying complete heart block.  Patient taken emergently to Cath Lab for right femoral vein temporary pacemaker.  On my evaluation, Brian [name] remained fairly obtunded.  He was only alert and oriented x 1 but otherwise hemodynamically stable.  Lab work with lactic acid of 0.7, serum creatinine of 3 up from 1.57, serum potassium elevated at 5.6.   - Will focus on correcting electrolyte derangements. Discussed with CCM.  - TTE from 01/07/23 w/ LVEF 45%-50%  - Repeat TTE tomorrow AM - Will continue to follow.    Brian [name] Advanced Heart Failure Mechanical Circulatory Support 6:18 PM

## 2023-02-26 NOTE — H&P (Addendum)
 NAME:  Brian [name], MRN:  [id number], DOB:  02-04-1956, LOS: 0 ADMISSION DATE:  02/26/2023, CONSULTATION DATE:  02/26/23 REFERRING MD:  Dr. 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  Per EMS report, unlabeled bill bottle on side table with large black capsule, pt did admit to ingesting two pills initially.  Unclear what capsule is at this time. Found in aflutter rate 40s and SBPs 70's treated with NS 500ml, atropine  x 2, and then given no improvement, started on epi gtt.  Some concern for possible CCB/ BB OD, on diltiazem  360mg  ER, and toprol  xl 200mg  per MAR. Unclear if pt doses his own meds or facility distributes.  Daughter reports pt has not been himself and depressed since being placed at [facility] in November following hospitalization for breakthrough seizure.  Additionally, pt's mother passed away several weeks ago.  No hx of prior SI attempts or known verbalization of SI per daughter, hx of prior marijuana abuse.  Concerned that one of his friends may have brought him something.   In ER, pt afebrile, remains paced on epi gtt, and requiring 2L [place].  Pt oriented to self  otherwise confused and able to follow commands.  No further emesis.  Pt denies taking substance.  Labs noted for normal lactic, K 5.6, BUN/sCr 52/ 2.84, trop hs 12, normal WBC, H/H at baseline, plts 130.  CXR showing cardiomegaly, no obvious infiltrate.  No obvious focal deficits.  Given additional atropine , then calcium  chloride, total of 3gm without improvement.   PCCM to admit, Cardiology consulted by EDP and pt being taken for TVP.    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 Alvia, laser therapy OD   Seizures [facility])    pt reports this is from low blood sugar   Significant Hospital Events: Including procedures, antibiotic start and stop dates in addition to other pertinent events   1/7 Admit symptomatic bradycardia, ?OD, cards s/p TVP  Interim History / Subjective:   Objective   Blood pressure (!) 119/57, pulse 68, temperature 97.6 F (36.4 C), temperature source Axillary, resp. rate (!) 24, SpO2 93%.       No intake or output data in the 24 hours ending 02/26/23 1634 There were no vitals filed for this visit.  Examination: General:  ill appearing elderly male HEENT: MM pink/dry, pupils 5/ r, anicteric, dysarthric  Neuro:  somnolent, easily to verbal, oriented to self, follows commands, MAE- weaker on LUE/ LLE (baseline) CV:  current TCP at 80 mma with rate 70, good capture, intermittent afib vs aflutter, warm extremities, skin dry, palpable radial pulses PULM:  non labored, clear anteriorly, few rales right base, no  wheeze, 2L [place] GI: soft, bs hypo, NT Extremities: warm/dry, no LE edema  Skin: no obvious rashes, not able to assess posterior  - unclear black capsule  Resolved Hospital Problem list    Assessment & Plan:   Symptomatic bradycardia with concern for possible OD/ SI attempt vs CCB/ BB overdose  - no response to atropine  or calcium  - pupils dilated but could be related to atropine  vs true toxidrome - daughter reports recent depression and concern for SI, no prior hx of  P:  - admit to ICU - appreciate Cardiology/  AHF assistance> to cath lab for TVP - wean epi for MAP > 65 and continue hemodynamic support - continue hemodynamic support - airway watch/ at risk for intubation, check VBG - high dose insulin  protocol> defer to cards - check UDS - SI precautions for now, psych consult when encephalopathy resolved   Encephalopathy- metabolic/ toxic Hx seizures- recently started back on keppra  12/2022 Hx CVA w/residual left hemiparesis - CTH pending from ER, no focal deficits - UDS/ VBG as above - NPO - keppra  IV for now - seizure precautions, no reports of clinical seizures, cont to monitor, defer EEG for now - precedex  for RASS goal 0/-1 - serial neuro checks - neuroprotective measures    AKI on CKD3b  Hyperkalemia - hemodynamic support - foley, send UA and check lytes - recheck BMET at 2300, temporizing measures prn.  NPO currently - hydrate  - serial renal indices/ strict I/Os   Afib on Eliquis  - ? Underlying aflutter - hold eliquis  - heparin  gtt per pharmacy pending CTH - optimize electrolytes    HFrEF/ HFpEF EF 45-50%, G2DD, mild Rv dysfunction HTN HLD - hold pta HTN/statin meds   DMT2 - CBG q 4, SSI prn if not started on high dose insulin  protocol   Best Practice (right click and Reselect all SmartList Selections daily)   Diet/type: NPO DVT prophylaxis SCD Pressure ulcer(s): N/A GI prophylaxis: N/A Lines: N/A Foley:  N/A Code Status:  full code Last date of multidisciplinary goals of care discussion [1/7]  Daughter updated by phone 1/7  Labs   CBC: Recent Labs  Lab 02/26/23 1544 02/26/23 1551  WBC  --  4.0  NEUTROABS  --  3.0  HGB 8.5* 8.6*  HCT 25.0* 27.7*  MCV  --  91.7  PLT  --  130*    Basic Metabolic Panel: Recent Labs  Lab 02/26/23 1544  NA 134*  K 5.5*  CL 104  GLUCOSE 109*  BUN 44*  CREATININE 3.00*   GFR: CrCl cannot be calculated (Unknown ideal weight.). Recent Labs  Lab 02/26/23 1545 02/26/23 1551  WBC  --  4.0   LATICACIDVEN 0.7  --     Liver Function Tests: No results for input(s): AST, ALT, ALKPHOS, BILITOT, PROT, ALBUMIN in the last 168 hours. No results for input(s): LIPASE, AMYLASE in the last 168 hours. No results for input(s): AMMONIA in the last 168 hours.  ABG    Component Value Date/Time   TCO2 22 02/26/2023 1544     Coagulation Profile: No results for input(s): INR, PROTIME in the last 168 hours.  Cardiac Enzymes: No results for input(s): CKTOTAL, CKMB, CKMBINDEX, TROPONINI in the last 168 hours.  HbA1C: Hgb A1c MFr Bld  Date/Time Value Ref Range Status  10/16/2022 11:45 AM 6.3 (H) <5.7 % of total Hgb Final    Comment:    For someone without known diabetes, a hemoglobin  A1c value between 5.7% and  6.4% is consistent with prediabetes and should be confirmed with a  follow-up test. . For someone with known diabetes, a value <7% indicates that their diabetes is well controlled. A1c targets should be individualized based on duration of diabetes, age, comorbid conditions, and other considerations. . This assay result is consistent with an increased risk of diabetes. . Currently, no consensus exists regarding use of hemoglobin A1c for diagnosis of diabetes for children. SABRA   04/17/2022 11:44 AM 6.5 (H) <5.7 % of total Hgb Final    Comment:    For someone without known diabetes, a hemoglobin A1c value of 6.5% or greater indicates that they may have  diabetes and this should be confirmed with a follow-up  test. . For someone with known diabetes, a value <7% indicates  that their diabetes is well controlled and a value  greater than or equal to 7% indicates suboptimal  control. A1c targets should be individualized based on  duration of diabetes, age, comorbid conditions, and  other considerations. . Currently, no consensus exists regarding use of hemoglobin A1c for diagnosis of diabetes for children. .     CBG: Recent Labs  Lab  02/26/23 1538  GLUCAP 106*    Review of Systems:   unable  Past Medical History:  He,  has a past medical history of Arthritis, Atrial fibrillation (HCC), Cancer (HCC), Cardiomyopathy, Cerebrovascular accident (HCC), Diabetes mellitus, Hyperlipidemia, Hypertension, Left-sided weakness, 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 (HCC), and Seizures (HCC).   Surgical History:   Past Surgical History:  Procedure Laterality Date   BRONCHIAL NEEDLE ASPIRATION BIOPSY  10/30/2022   Procedure: BRONCHIAL NEEDLE ASPIRATION BIOPSIES;  Surgeon: Brenna Adine CROME, DO;  Location: [facility];  Service: Cardiopulmonary;;   COLONOSCOPY     DIRECT LARYNGOSCOPY N/A 02/10/2020   Procedure: DIRECT LARYNGOSCOPY WITH BIOPSY;  Surgeon: [name] Clunes, MD;  Location: [facility] OR;  Service: ENT;  Laterality: N/A;   Ear surgery as a child     IR GASTROSTOMY TUBE MOD SED  03/23/2020   IR GASTROSTOMY TUBE REMOVAL  06/01/2020   IR IMAGING GUIDED PORT INSERTION  03/09/2020   IR REMOVAL TUN ACCESS W/ PORT W/O FL MOD SED  02/08/2021   THORACENTESIS  10/30/2022   Procedure: MILANA;  Surgeon: Brenna Adine CROME, DO;  Location: [facility];  Service: Cardiopulmonary;;  L pleural effusion   TONSILLECTOMY     VIDEO BRONCHOSCOPY Left 10/30/2022   Procedure: VIDEO BRONCHOSCOPY WITHOUT FLUORO;  Surgeon: Brenna Adine CROME, DO;  Location: [facility];  Service: Cardiopulmonary;  Laterality: Left;   VIDEO BRONCHOSCOPY WITH ENDOBRONCHIAL ULTRASOUND Bilateral 10/30/2022   Procedure: VIDEO BRONCHOSCOPY WITH ENDOBRONCHIAL ULTRASOUND;  Surgeon: Brenna Adine CROME, DO;  Location: [facility];  Service: Cardiopulmonary;  Laterality: Bilateral;     Social History:   reports that he has never smoked. He has never been exposed to tobacco smoke. He has never used smokeless tobacco. He reports current alcohol  use. He reports that he does not currently use drugs after having used the following drugs: Marijuana. Frequency: 7.00 times per week.   Family  History:  His family history includes Breast cancer in his sister; Colon cancer in his mother; Hyperlipidemia in his father; Hypertension in his father. There is no history of Coronary artery disease, Stomach cancer, Rectal cancer, or Esophageal cancer.   Allergies Allergies  Allergen Reactions   Codeine Other (See Comments)    Unknown reaction, severe headach   Metformin  And Related Diarrhea     Home Medications  Prior  to Admission medications   Medication Sig Start Date End Date Taking? Authorizing Provider  apixaban  (ELIQUIS ) 5 MG TABS tablet Take 1 tablet (5 mg total) by mouth 2 (two) times daily. 10/16/22   Duanne Butler DASEN, MD  atorvastatin  (LIPITOR) 20 MG tablet Take 1 tablet (20 mg total) by mouth daily. 05/28/22   Duanne Butler DASEN, MD  Continuous Blood Gluc Sensor (FREESTYLE LIBRE 2 SENSOR) MISC Use as directed to monitor blood glucose continuously. Change sensor Q 14 days. DX E11.65 04/11/20   Duanne Butler DASEN, MD  diltiazem  (CARDIZEM  CD) 360 MG 24 hr capsule Take 1 capsule (360 mg total) by mouth daily. 01/10/23   Ghimire, Donalda HERO, MD  empagliflozin  (JARDIANCE ) 25 MG TABS tablet Take 1 tablet (25 mg total) by mouth daily before breakfast. 10/19/22   Duanne Butler DASEN, MD  furosemide  (LASIX ) 40 MG tablet Take 40 mg by mouth as needed for fluid or edema.    [provider]  glucose blood test strip 1 each by Other route as needed for other. Use as instructed 03/10/20   Duanne Butler DASEN, MD  hydrALAZINE  (APRESOLINE ) 50 MG tablet Take 1 tablet (50 mg total) by mouth 3 (three) times daily. 01/01/23   Duanne Butler DASEN, MD  insulin  aspart (NOVOLOG  FLEXPEN) 100 UNIT/ML FlexPen 0-9 Units, Subcutaneous, 3 times daily with meals CBG < 70: Implement Hypoglycemia measures CBG 70 - 120: 0 units CBG 121 - 150: 1 unit CBG 151 - 200: 2 units CBG 201 - 250: 3 units CBG 251 - 300: 5 units CBG 301 - 350: 7 units CBG 351 - 400: 9 units CBG > 400: call MD 01/10/23   Raenelle Donalda HERO, MD   levETIRAcetam  (KEPPRA ) 500 MG tablet Take 1 tablet (500 mg total) by mouth 2 (two) times daily. 01/10/23   Ghimire, Donalda HERO, MD  losartan  (COZAAR ) 100 MG tablet Take 0.5 tablets (50 mg total) by mouth daily. 01/10/23   Ghimire, Donalda HERO, MD  MAGNESIUM  PO Take 1 tablet by mouth daily.    [provider]  metoprolol  succinate (TOPROL -XL) 200 MG 24 hr tablet Take 1 tablet (200 mg total) by mouth daily. Take with or immediately following a meal. 01/10/23   Ghimire, Donalda HERO, MD     Critical care time: 60 mins       Lyle Pesa, MSN, AG-ACNP-BC [facility] 02/26/2023, 4:34 PM  See Amion for pager If no response to pager , please call [phone] until 7pm After 7:00 pm call Elink  [phone]

## 2023-02-27 ENCOUNTER — Encounter (HOSPITAL_COMMUNITY): Payer: Self-pay | Admitting: Cardiology

## 2023-02-27 ENCOUNTER — Telehealth: Payer: Self-pay | Admitting: *Deleted

## 2023-02-27 ENCOUNTER — Telehealth: Payer: Self-pay | Admitting: Radiation Oncology

## 2023-02-27 ENCOUNTER — Inpatient Hospital Stay (HOSPITAL_COMMUNITY): Payer: Medicare HMO

## 2023-02-27 ENCOUNTER — Ambulatory Visit
Admission: RE | Admit: 2023-02-27 | Discharge: 2023-02-27 | Disposition: A | Discharge status: Home / Self Care | Payer: Medicare HMO | Source: Ambulatory Visit | Attending: Radiation Oncology | Admitting: Radiation Oncology

## 2023-02-27 DIAGNOSIS — I5022 Chronic systolic (congestive) heart failure: Secondary | ICD-10-CM

## 2023-02-27 DIAGNOSIS — I480 Paroxysmal atrial fibrillation: Secondary | ICD-10-CM | POA: Diagnosis not present

## 2023-02-27 DIAGNOSIS — R140 Abdominal distension (gaseous): Secondary | ICD-10-CM | POA: Diagnosis not present

## 2023-02-27 DIAGNOSIS — Z4682 Encounter for fitting and adjustment of non-vascular catheter: Secondary | ICD-10-CM | POA: Diagnosis not present

## 2023-02-27 DIAGNOSIS — I442 Atrioventricular block, complete: Secondary | ICD-10-CM

## 2023-02-27 DIAGNOSIS — R1110 Vomiting, unspecified: Secondary | ICD-10-CM | POA: Diagnosis not present

## 2023-02-27 DIAGNOSIS — R001 Bradycardia, unspecified: Secondary | ICD-10-CM | POA: Diagnosis not present

## 2023-02-27 DIAGNOSIS — N179 Acute kidney failure, unspecified: Secondary | ICD-10-CM | POA: Diagnosis not present

## 2023-02-27 DIAGNOSIS — N1832 Chronic kidney disease, stage 3b: Secondary | ICD-10-CM | POA: Diagnosis not present

## 2023-02-27 DIAGNOSIS — G9341 Metabolic encephalopathy: Secondary | ICD-10-CM

## 2023-02-27 DIAGNOSIS — R0989 Other specified symptoms and signs involving the circulatory and respiratory systems: Secondary | ICD-10-CM | POA: Diagnosis not present

## 2023-02-27 DIAGNOSIS — I700 Atherosclerosis of aorta: Secondary | ICD-10-CM | POA: Diagnosis not present

## 2023-02-27 DIAGNOSIS — E875 Hyperkalemia: Secondary | ICD-10-CM

## 2023-02-27 DIAGNOSIS — C771 Secondary and unspecified malignant neoplasm of intrathoracic lymph nodes: Secondary | ICD-10-CM

## 2023-02-27 DIAGNOSIS — R918 Other nonspecific abnormal finding of lung field: Secondary | ICD-10-CM | POA: Diagnosis not present

## 2023-02-27 DIAGNOSIS — F1290 Cannabis use, unspecified, uncomplicated: Secondary | ICD-10-CM | POA: Diagnosis not present

## 2023-02-27 DIAGNOSIS — I10 Essential (primary) hypertension: Secondary | ICD-10-CM | POA: Diagnosis not present

## 2023-02-27 LAB — CBC
HCT: 29.6 % — ABNORMAL LOW (ref 39.0–52.0)
Hemoglobin: 9.4 g/dL — ABNORMAL LOW (ref 13.0–17.0)
MCH: 28.5 pg (ref 26.0–34.0)
MCHC: 31.8 g/dL (ref 30.0–36.0)
MCV: 89.7 fL (ref 80.0–100.0)
Platelets: 149 K/uL — ABNORMAL LOW (ref 150–400)
RBC: 3.3 MIL/uL — ABNORMAL LOW (ref 4.22–5.81)
RDW: 16.5 % — ABNORMAL HIGH (ref 11.5–15.5)
WBC: 3.6 K/uL — ABNORMAL LOW (ref 4.0–10.5)
nRBC: 0 % (ref 0.0–0.2)

## 2023-02-27 LAB — BASIC METABOLIC PANEL
Anion gap: 6 (ref 5–15)
Anion gap: 9 (ref 5–15)
BUN: 45 mg/dL — ABNORMAL HIGH (ref 8–23)
BUN: 48 mg/dL — ABNORMAL HIGH (ref 8–23)
CO2: 22 mmol/L (ref 22–32)
CO2: 21 mmol/L — ABNORMAL LOW (ref 22–32)
Calcium: 9 mg/dL (ref 8.9–10.3)
Calcium: 9.2 mg/dL (ref 8.9–10.3)
Chloride: 107 mmol/L (ref 98–111)
Chloride: 107 mmol/L (ref 98–111)
Creatinine, Ser: 2.43 mg/dL — ABNORMAL HIGH (ref 0.61–1.24)
Creatinine, Ser: 2.6 mg/dL — ABNORMAL HIGH (ref 0.61–1.24)
GFR, Estimated: 28 mL/min — ABNORMAL LOW (ref >60)
GFR, Estimated: 26 mL/min — ABNORMAL LOW (ref >60)
Glucose, Bld: 118 mg/dL — ABNORMAL HIGH (ref 70–99)
Glucose, Bld: 71 mg/dL (ref 70–99)
Potassium: 5.9 mmol/L — ABNORMAL HIGH (ref 3.5–5.1)
Potassium: 4.9 mmol/L (ref 3.5–5.1)
Sodium: 135 mmol/L (ref 135–145)
Sodium: 137 mmol/L (ref 135–145)

## 2023-02-27 LAB — HEMOGLOBIN AND HEMATOCRIT, BLOOD
HCT: 33.4 % — ABNORMAL LOW (ref 39.0–52.0)
Hemoglobin: 11 g/dL — ABNORMAL LOW (ref 13.0–17.0)

## 2023-02-27 LAB — ECHOCARDIOGRAM COMPLETE
AR max vel: 2.38 cm2
AV Area VTI: 2.82 cm2
AV Area mean vel: 2.4 cm2
AV Mean grad: 1.7 mm[Hg]
AV Peak grad: 3.1 mm[Hg]
Ao pk vel: 0.88 m/s
Area-P 1/2: 3.92 cm2
S' Lateral: 2.4 cm
Weight: 2680.79 [oz_av]

## 2023-02-27 LAB — APTT
aPTT: 60 s — ABNORMAL HIGH (ref 24–36)
aPTT: 98 s — ABNORMAL HIGH (ref 24–36)

## 2023-02-27 LAB — GLUCOSE, CAPILLARY
Glucose-Capillary: 106 mg/dL — ABNORMAL HIGH (ref 70–99)
Glucose-Capillary: 107 mg/dL — ABNORMAL HIGH (ref 70–99)
Glucose-Capillary: 113 mg/dL — ABNORMAL HIGH (ref 70–99)
Glucose-Capillary: 86 mg/dL (ref 70–99)
Glucose-Capillary: 99 mg/dL (ref 70–99)

## 2023-02-27 LAB — HEPARIN LEVEL (UNFRACTIONATED): Heparin Unfractionated: >1.1 [IU]/mL — ABNORMAL HIGH (ref 0.30–0.70)

## 2023-02-27 LAB — MAGNESIUM: Magnesium: 2.6 mg/dL — ABNORMAL HIGH (ref 1.7–2.4)

## 2023-02-27 LAB — PHOSPHORUS: Phosphorus: 4.9 mg/dL — ABNORMAL HIGH (ref 2.5–4.6)

## 2023-02-27 MED ORDER — SODIUM ZIRCONIUM CYCLOSILICATE 10 G PO PACK
10.0000 g | PACK | Freq: Once | ORAL | Status: AC
  Administered 2023-02-27: 10 g via ORAL
  Filled 2023-02-27: qty 1

## 2023-02-27 MED ORDER — FUROSEMIDE 10 MG/ML IJ SOLN
40.0000 mg | Freq: Once | INTRAMUSCULAR | Status: AC
  Administered 2023-02-27: 40 mg via INTRAVENOUS
  Filled 2023-02-27: qty 4

## 2023-02-27 MED ORDER — ATORVASTATIN CALCIUM 10 MG PO TABS
20.0000 mg | ORAL_TABLET | Freq: Every day | ORAL | Status: DC
  Administered 2023-03-01: 20 mg via ORAL
  Filled 2023-02-27: qty 2

## 2023-02-27 MED ORDER — LACTATED RINGERS IV SOLN: INTRAVENOUS | Status: DC

## 2023-02-27 MED ORDER — LACTATED RINGERS IV BOLUS
1000.0000 mL | Freq: Once | INTRAVENOUS | Status: AC
  Administered 2023-02-27: 1000 mL via INTRAVENOUS

## 2023-02-27 MED ORDER — INSULIN ASPART 100 UNIT/ML IV SOLN
10.0000 [IU] | Freq: Once | INTRAVENOUS | Status: AC
  Administered 2023-02-27: 10 [IU] via INTRAVENOUS

## 2023-02-27 MED ORDER — HEPARIN (PORCINE) 25000 UT/250ML-% IV SOLN: 1050.0000 [IU]/h | INTRAVENOUS | Status: DC

## 2023-02-27 MED ORDER — ONDANSETRON HCL 4 MG/2ML IJ SOLN
4.0000 mg | Freq: Once | INTRAMUSCULAR | Status: AC
  Administered 2023-02-28: 4 mg via INTRAVENOUS
  Filled 2023-02-27: qty 2

## 2023-02-27 MED ORDER — ONDANSETRON HCL 4 MG/2ML IJ SOLN
4.0000 mg | Freq: Three times a day (TID) | INTRAMUSCULAR | Status: DC | PRN
  Administered 2023-02-27: 4 mg via INTRAVENOUS
  Filled 2023-02-27: qty 2

## 2023-02-27 MED ORDER — PANTOPRAZOLE SODIUM 40 MG IV SOLR
40.0000 mg | Freq: Two times a day (BID) | INTRAVENOUS | Status: DC
  Administered 2023-02-27 – 2023-03-01 (×5): 40 mg via INTRAVENOUS
  Filled 2023-02-27 (×5): qty 10

## 2023-02-27 MED ORDER — DEXTROSE 50 % IV SOLN
1.0000 | Freq: Once | INTRAVENOUS | Status: AC
  Administered 2023-02-27: 50 mL via INTRAVENOUS
  Filled 2023-02-27: qty 50

## 2023-02-27 MED ORDER — PERFLUTREN LIPID MICROSPHERE
1.0000 mL | INTRAVENOUS | Status: AC | PRN
  Administered 2023-02-27: 3 mL via INTRAVENOUS

## 2023-02-27 NOTE — Progress Notes (Addendum)
 eLink Physician-Brief Progress Note Patient Name: Brian [name] DOB: [birth year] MRN: [id number]   Date of Service  02/27/2023  HPI/Events of Note  AKI on CKD3b, Hyperkalemia K 5.9, Cr 2.43  eICU Interventions  Hyperkalemia protocol, Lokelma  ordered   0129 -not currently safe to swallow independently.  Will place NG tube.  Intervention Category Intermediate Interventions: Electrolyte abnormality - evaluation and management  Brian [name] 02/27/2023, 12:31 AM

## 2023-02-27 NOTE — Evaluation (Signed)
 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 Alvia, laser therapy OD   Seizures (HCC)    pt reports this is from low blood sugar   Past Surgical History:  Past Surgical History:  Procedure Laterality Date   BRONCHIAL NEEDLE ASPIRATION BIOPSY  10/30/2022   Procedure: BRONCHIAL NEEDLE ASPIRATION BIOPSIES;  Surgeon: Brenna Adine LITTIE, DO;  [facility];  Service: Cardiopulmonary;;   COLONOSCOPY     DIRECT LARYNGOSCOPY N/A 02/10/2020   Procedure: DIRECT LARYNGOSCOPY WITH BIOPSY;  Surgeon: [name] Clunes, MD;  Location: [facility] OR;  Service: ENT;  Laterality: N/A;   Ear surgery as a child     IR GASTROSTOMY TUBE MOD SED  03/23/2020   IR GASTROSTOMY TUBE REMOVAL  06/01/2020   IR IMAGING GUIDED PORT INSERTION  03/09/2020   IR REMOVAL TUN ACCESS W/ PORT W/O FL MOD SED  02/08/2021   TEMPORARY PACEMAKER N/A 02/26/2023   Procedure: TEMPORARY PACEMAKER;  Surgeon: Anner Alm ORN, MD;  [facility] INVAS[facility] CV [facility];  Service: Cardiovascular;  Laterality: N/A;   THORACENTESIS  10/30/2022   Procedure: THORACENTESIS;  Surgeon: Brenna Adine LITTIE, DO;  [facility];  Service: Cardiopulmonary;;  L pleural effusion   TONSILLECTOMY     VIDEO BRONCHOSCOPY Left 10/30/2022   Procedure: VIDEO BRONCHOSCOPY WITHOUT FLUORO;  Surgeon: Brenna Adine LITTIE, DO;  [facility];  Service: Cardiopulmonary;  Laterality: Left;   VIDEO BRONCHOSCOPY WITH  ENDOBRONCHIAL ULTRASOUND Bilateral 10/30/2022   Procedure: VIDEO BRONCHOSCOPY WITH ENDOBRONCHIAL ULTRASOUND;  Surgeon: Brenna Adine LITTIE, DO;  Location: [facility];  Service: Cardiopulmonary;  Laterality: Bilateral;   HPI:  Brian [name] is a 68 yo male presenting to ED 1/7 after being found with AMS after episode of emesis with half dissolved pill in emesis. Underwent pacemaker placement 1/7. Worked with OP SLP to target dysphagia goals following MBS 10/26/21 with mild-moderate pharyngeal dysphagia suspected to be related to post-radiation changes with edematous tissue particularly noted at the epiglottis and arytenoids. He did aspirate thin liquids but cleared them when cued to perform strong throat clearance and was recommended a mechanical soft diet with thin liquids. Per that SLP, given that penetration is likely with thicker liquids (and aspiration possible across a meal), there is increased residue, and pt has remained stable from a pulmonary standpoint, recommend continuing with thin liquids in the setting of thorough oral care and implementation of a cough or more effortful throat clear post-swallow. PMH includes A-fib on Eliquis , systolic and diastolic HF, HTN, HLD, CKD, seizure disorder, R MCA CVA with residual L hemiparesis, SCC of tongue    Assessment / Plan / Recommendation  Clinical Impression  Pt required to lay flat s/p pacemaker placement so SLP and RN utilized reverse trendelenburg to faciliate adequate positioning for PO trials. Pt is limited to <5 lower dentition and states he does not wear dentures, which  further limits his diet. He was able to recount his history of dysphagia with good understanding of precautions that he states he still uses with thin liquids. Observed pt with trials of thin liquids which intermittently resulted in immediate throat clearance, which appears consistent with previous performance and is a recommended compensatory strategy. Pt had an increasingly wet  quality of voice and had to be cued to use throat clearance with teaspoon presentations of puree. Question impact of positioning on presentation this date; however, feel that a repeat MBS would be beneficial for further assessment given pt's history of dysphagia. Recommend continuing current diet of clear liquids for now. Discussed with RN, who will contact SLP when postural control is no longer in place after pacemaker removal which is suspected to be later this date. SLP will f/u to complete MBS prior to diet advancement. SLP Visit Diagnosis: Dysphagia, unspecified (R13.10)    Aspiration Risk  Mild aspiration risk    Diet Recommendation Thin liquid    Liquid Administration via: Spoon;Cup;Straw Medication Administration: Whole meds with liquid Supervision: Staff to assist with self feeding;Full supervision/cueing for compensatory strategies Compensations: Minimize environmental distractions;Slow rate;Small sips/bites;Clear throat after each swallow Postural Changes: Other (Comment) (reverse trendelenburg, fully upright as able)    Other  Recommendations Oral Care Recommendations: Oral care QID;Staff/trained caregiver to provide oral care    Recommendations for follow up therapy are one component of a multi-disciplinary discharge planning process, led by the attending physician.  Recommendations may be updated based on patient status, additional functional criteria and insurance authorization.  Follow up Recommendations Skilled nursing-short term rehab (<3 hours/day)      Assistance Recommended at Discharge    Functional Status Assessment Patient has had a recent decline in their functional status and demonstrates the ability to make significant improvements in function in a reasonable and predictable amount of time.  Frequency and Duration min 2x/week  2 weeks       Prognosis Prognosis for improved oropharyngeal function: Good Barriers to Reach Goals: Time post onset      Swallow  Study   General HPI: Brian [name] is a 68 yo male presenting to ED 1/7 after being found with AMS after episode of emesis with half dissolved pill in emesis. Underwent pacemaker placement 1/7. Worked with OP SLP to target dysphagia goals following MBS 10/26/21 with mild-moderate pharyngeal dysphagia suspected to be related to post-radiation changes with edematous tissue particularly noted at the epiglottis and arytenoids. He did aspirate thin liquids but cleared them when cued to perform strong throat clearance and was recommended a mechanical soft diet with thin liquids. Per that SLP, given that penetration is likely with thicker liquids (and aspiration possible across a meal), there is increased residue, and pt has remained stable from a pulmonary standpoint, recommend continuing with thin liquids in the setting of thorough oral care and implementation of a cough or more effortful throat clear post-swallow. PMH includes A-fib on Eliquis , systolic and diastolic HF, HTN, HLD, CKD, seizure disorder, R MCA CVA with residual L hemiparesis, SCC of tongue Type of Study: Bedside Swallow Evaluation Previous Swallow Assessment: see HPI Diet Prior to this Study: Clear liquid diet Temperature Spikes Noted: No Respiratory Status: Room air History of Recent Intubation: No Behavior/Cognition: Alert;Cooperative;Requires cueing Oral Cavity Assessment: Within Functional Limits Oral Care Completed by SLP: Yes Oral Cavity - Dentition: Missing dentition;Poor condition (dentition limited to <5 bottom) Vision: Functional for self-feeding Self-Feeding Abilities: Total assist Patient Positioning: Upright in bed Baseline Vocal Quality:  Normal Volitional Cough: Strong Volitional Swallow: Able to elicit    Oral/Motor/Sensory Function Overall Oral Motor/Sensory Function: Within functional limits   Ice Chips Ice chips: Not tested   Thin Liquid Thin Liquid: Impaired Presentation: Straw Pharyngeal  Phase Impairments:  Throat Clearing - Immediate    Nectar Thick Nectar Thick Liquid: Not tested   Honey Thick Honey Thick Liquid: Not tested   Puree Puree: Impaired Presentation: Spoon Pharyngeal Phase Impairments: Suspected delayed Swallow;Wet Vocal Quality;Throat Clearing - Immediate   Solid     Solid: Not tested      Damien Blumenthal, M.A., CF-SLP Speech Language Pathology, Acute Rehabilitation Services  Secure Chat preferred [phone]  02/27/2023,11:55 AM

## 2023-02-27 NOTE — Telephone Encounter (Signed)
 1/8 @ 12:35 pm Patient's son called to let someone be aware that patient is in [facility] ICU, and he would like to reschedule his FU20 appointment.  Left voicemail with Darryl Nestle, so they are aware.

## 2023-02-27 NOTE — Progress Notes (Signed)
 SLP Cancellation Note  Patient Details Name: Brian [name] MRN: [id number] DOB: 1955/12/13   Cancelled treatment:       Reason Eval/Treat Not Completed: Patient at procedure or test/unavailable. Will continue attempts to f/u.    Damien Blumenthal, M.A., CF-SLP Speech Language Pathology, Acute Rehabilitation Services  Secure Chat preferred [phone]  02/27/2023, 10:30 AM

## 2023-02-27 NOTE — Progress Notes (Signed)
 Decreased back up pacing rate to 50BPM. Patient did not require pacing for several hours. Telemetry with atrial fibrillation with rates in the 80s-90s with no significant pauses.   Right femoral vein temp pacemaker removed. Sheath pulled and pressure held for 15 minutes. Holding heparin  gtt. Bedrest for 3 hours.   CCTIME:74min  Coyle Stordahl 2:21 PM

## 2023-02-27 NOTE — Progress Notes (Addendum)
 eLink Physician-Brief Progress Note Patient Name: NGAI PARCELL DOB: 19-Aug-1955 MRN: [id number]   Date of Service  02/27/2023  HPI/Events of Note  68 year old gentleman with a history of CKD 3B, seizures, atrial fibrillation on Eliquis , chronic HFrEF who presented to the hospital with acute encephalopathy, found to have complete heart block with bradycardia and hypotension.   mult episodes of N/V today w/coffee grounds emesis.  No oral meds due at this time  eICU Interventions  Hold AM atorvastatin , otherwise no PO meds  Maintain Protonix  IV bid  Recommend NG tube placement, has refused it and has capacity per psych.    2205 - another episode of projectile vomiting of coffee grounds emesis.Zofran  x1 kub x1 for placement  Intervention Category Intermediate Interventions: Bleeding - evaluation and treatment with blood products  Anneth Brunell 02/27/2023, 9:37 PM

## 2023-02-27 NOTE — Consult Note (Addendum)
 [facility] Psychiatric Consult Initial  Patient Name: .Brian [name]  MRN: [id number]  DOB: 11-14-1955  Consult Order details: Desai/suicidal ideation  Mode of Visit: In person    Psychiatry Consult Evaluation  Service Date: February 27, 2023 LOS:  LOS: 1 day  Chief Complaint I just wanted to chill out  Primary Psychiatric Diagnoses  Marijuana use disorder 2.  ?MDD vs complicated grief   Assessment  Brian [name] is a 68 y.o. male admitted: Medically for 02/26/2023  3:16 PM for AMS. He carries the psychiatric diagnoses of no formal diagnoses and has a past medical history of squamous cell lung cancer, cardiomyopathy, arthritis, afib, HTN, HLD, diabetes, L sided weakness, seizures.   His current presentation of use of marijuana despite prior health consequences is most consistent with a substance use disorder.  Separately, his family notes concern for depression after the death of his mother and diagnosis of his own lung cancer.  He has no premorbid psychiatric history, has consistently denied that this is a suicide attempt, and while family would like for him to get help and potentially be started on medications they have no concern that this was a suicide attempt.   Current outpatient psychotropic medications include nothing On initial examination, patient was very mildly confused as evidenced by bedside neurocognitive testing but gave appropriate answers to all questions - deferred many non-essential questions given concerns about pt being unreliable historian. Please see plan below for detailed recommendations.   Diagnoses:  Active Hospital problems: Principal Problem:   Symptomatic bradycardia    Plan   ## Psychiatric Medication Recommendations:  -- none currently   ## Medical Decision Making Capacity: Not specifically addressed in this encounter  ## Further Work-up:  -- none currently - could consider folate  -- most recent EKG on 1/8 had QtC of 467 in setting of  bradycardia; interpret with caution -- Pertinent labwork reviewed earlier this admission includes: tsh wnl, b12 wnl   ## Disposition:-- There are no psychiatric contraindications to discharge at this time  ## Behavioral / Environmental: - No specific recommendations at this time.  TOC consult before dc counselling    ## Safety and Observation Level:  - Based on my clinical evaluation, I estimate the patient to be at low risk of self harm in the current setting. - At this time, we recommend  routine observation. This decision is based on my review of the chart including patient's history and current presentation, interview of the patient, mental status examination, and consideration of suicide risk including evaluating suicidal ideation, plan, intent, suicidal or self-harm behaviors, risk factors, and protective factors. This judgment is based on our ability to directly address suicide risk, implement suicide prevention strategies, and develop a safety plan while the patient is in the clinical setting. Please contact our team if there is a concern that risk level has changed.  CSSR Risk Category:   Suicide Risk Assessment: Patient has following modifiable risk factors for suicide: under treated depression  and lack of access to outpatient mental health resources, which we are addressing by evaluating again tomorrow, offering medication/grief counselling prior to dc. Patient has following non-modifiable or demographic risk factors for suicide: male gender and separation or divorce, recent loss Patient has the following protective factors against suicide: Supportive family, Cultural, spiritual, or religious beliefs that discourage suicide, and no history of suicide attempts, grandchildren, future orientation,   Thank you for this consult request. Recommendations have been communicated to the primary team.  We  will see x1 tomorrow to offer meds/resources at this time.   Song Garris A Danali Marinos        History of Present Illness  Relevant Aspects of [facility] Course:  Admitted on 02/26/2023 for AMS/bradycardia. They were found to have swallowed an unknown capsule.   Patient Report:  Patient seen in the late afternoon.  Oriented to self and generally to situation-difficulties with month and year.  Knows it is Jan but thought 2024. Difficulty with bedside attention testing as well.  He did not know that psychiatry was coming but was very pleasant.  Thinks he is in hospital for aneurysm or seizure.   He told me that he swallowed THC pills to get relief his nerves; he used to smoke frequently but had to stop when he moved into an assisted living facility.  His friend brought him the pills.  He had no intention of harming himself.  Tells me that no one has asked him why he swallowed the pills before (this is untrue).  Multiple statements along the lines of life is too good, I have kids, grand kids, family  Separately, endorses several weeks of low mood after the death of his mother.  Reminisces about how meaningful she was to him and is grateful that he got 69 years with her before she passed.  He does not feel that he is depressed and declined medication at this time.    Psych ROS:  Depression: Patient endorsed low mood which he feels is in proportion to his recent loss. Anxiety: Denied Mania (lifetime and current): Denied Psychosis: (lifetime and current): Denied  Collateral information:  Spoke to nurse-he told her a couple of hours ago that he swallowed marijuana pills to get high which she corroborated with daughter. Spoke to daughter over the phone-he told her the same story.  She has been worried about depression but has no concern for suicidality.  His guns are being locked up as a precaution already by her brother.  He told her this morning that he would be willing to consider antidepressants.  Per primary team more alert now than this AM.  Daughter also shares that in  the past he has had hospital admissions for seizures after marijuana use.  She is concerned that is impacting his care for his lung cancer and hopes that if he agrees to treatment for underlying depression use will decline.  ROS relevant psychiatric ROS above  Psychiatric and Social History  Psychiatric History:  Information collected from patient  Prev Dx/Sx: None Current Psych Provider: None Home Meds (current): None Previous Med Trials: None Therapy: Patient clearly did not understand this question-kept referring to chemotherapy.  Prior Psych Hospitalization: No Prior Self Harm: No Prior Violence: Deferred  Family Psych History: Minimal Family Hx suicide: Deferred  Social History:  Developmental Hx: Deferred Educational Hx: Deferred Occupational Hx: Deferred Legal Hx: Deferred Living Situation: Currently in a care facility Spiritual Hx: Patient believes in God but is unsure as to whether he thinks suicide is a sin or not.  I would have to think on that 1. Access to weapons/lethal means: Patient states that he owns several guns which are locked up and unloaded.  Daughter states his guns are being removed.  Substance History Alcohol : Remote (years) Type of alcohol  N/A Last Drink N/A Number of drinks per day N/A History of alcohol  withdrawal seizures deferred History of DT's deferred Tobacco: Denied Illicit drugs: Honey, the only things they have ever done have been weed and alcohol   and I quit alcohol  years ago. Prescription drug abuse: Denied Rehab hx: Deferred  Exam Findings  Physical Exam:  Vital Signs:  Temp:  [97.1 F (36.2 C)-98.4 F (36.9 C)] 98.4 F (36.9 C) (01/08 0000) Pulse Rate:  [78-100] 100 (01/08 1900) Resp:  [8-25] 12 (01/08 1900) BP: (88-161)/(54-112) 96/54 (01/08 1900) SpO2:  [89 %-100 %] 99 % (01/08 1900) Weight:  [76 kg] 76 kg (01/08 0500) Blood pressure (!) 96/54, pulse 100, temperature 98.4 F (36.9 C), temperature source Axillary,  resp. rate 12, weight 76 kg, SpO2 99%. Body mass index is 24.74 kg/m.  Physical Exam HENT:     Head: Normocephalic.  Pulmonary:     Effort: Pulmonary effort is normal.  Neurological:     Mental Status: He is alert.     Mental Status Exam: General Appearance:  Ill appearing  Orientation:  Other:  Partial  Memory:  Immediate;   Fair Recent;   Fair Remote;   Good  Concentration:  Concentration: Fair  Recall:  Fair  Attention  Fair  Eye Contact:  Good  Speech:  Clear and Coherent  Language:  Good  Volume:  Normal  Mood: Sad - I just lost my mom  Affect:  Congruent, full range, brightens appropriately   Thought Process:  Coherent  Thought Content:   devoid of delusions/paranoia  Suicidal Thoughts:  No  Homicidal Thoughts:  No  Judgement:  Poor  Insight:  Fair  Psychomotor Activity:  Normal  Akathisia:  No  Fund of Knowledge:  Fair   Other History   These have been pulled in through the EMR, reviewed, and updated if appropriate.  Family History:  The patient's family history includes Breast cancer in his sister; Colon cancer in his mother; Hyperlipidemia in his father; Hypertension in his father.  Medical History: Past Medical History:  Diagnosis Date  . Arthritis   . Atrial fibrillation (HCC)   . Cancer (HCC)    SCC base of tongue 2021  . Cardiomyopathy   . Cerebrovascular accident (HCC)    2012  . Diabetes mellitus   . Hyperlipidemia   . Hypertension   . Left-sided weakness   . 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 Alvia, laser therapy OD  . Seizures (HCC)    pt reports this is from low blood sugar    Surgical History: Past Surgical History:  Procedure Laterality Date  . BRONCHIAL NEEDLE ASPIRATION BIOPSY  10/30/2022   Procedure: BRONCHIAL NEEDLE ASPIRATION BIOPSIES;  Surgeon: Brenna Adine CROME, DO;  Location: [facility] ENDOSCOPY;  Service: Cardiopulmonary;;  . COLONOSCOPY    . DIRECT LARYNGOSCOPY N/A 02/10/2020   Procedure: DIRECT LARYNGOSCOPY  WITH BIOPSY;  Surgeon: [name] Clunes, MD;  [facility];  Service: ENT;  Laterality: N/A;  . Ear surgery as a child    . IR GASTROSTOMY TUBE MOD SED  03/23/2020  . IR GASTROSTOMY TUBE REMOVAL  06/01/2020  . IR IMAGING GUIDED PORT INSERTION  03/09/2020  . IR REMOVAL TUN ACCESS W/ PORT W/O FL MOD SED  02/08/2021  . TEMPORARY PACEMAKER N/A 02/26/2023   Procedure: TEMPORARY PACEMAKER;  Surgeon: Anner Alm ORN, MD;  Location: [facility] INVASIVE CV LAB;  Service: Cardiovascular;  Laterality: N/A;  . THORACENTESIS  10/30/2022   Procedure: THORACENTESIS;  Surgeon: Brenna Adine CROME, DO;  Location: [facility] ENDOSCOPY;  Service: Cardiopulmonary;;  L pleural effusion  . TONSILLECTOMY    . VIDEO BRONCHOSCOPY Left 10/30/2022   Procedure: VIDEO BRONCHOSCOPY WITHOUT FLUORO;  Surgeon:  Brenna Adine CROME, DO;  Location: [facility] ENDOSCOPY;  Service: Cardiopulmonary;  Laterality: Left;  SABRA VIDEO BRONCHOSCOPY WITH ENDOBRONCHIAL ULTRASOUND Bilateral 10/30/2022   Procedure: VIDEO BRONCHOSCOPY WITH ENDOBRONCHIAL ULTRASOUND;  Surgeon: Brenna Adine CROME, DO;  Location: [facility] ENDOSCOPY;  Service: Cardiopulmonary;  Laterality: Bilateral;     Medications:   Current Facility-Administered Medications:  .  atorvastatin  (LIPITOR) tablet 20 mg, 20 mg, Oral, Daily, Desai, Rahul P, PA-C .  Chlorhexidine  Gluconate Cloth 2 % PADS 6 each, 6 each, Topical, Daily, Brian Toribio BROCKS, MD, 6 each at 02/27/23 [phone] .  dexmedetomidine  (PRECEDEX ) 400 MCG/100ML (4 mcg/mL) infusion, 0-0.9 mcg/kg/hr, Intravenous, Titrated, Antonetta Moccasin B, NP, Stopped at 02/27/23 1132 .  ferumoxytol  (FERAHEME) 510 mg in sodium chloride  0.9 % 100 mL IVPB, 510 mg, Intravenous, Weekly, Macel Jayson PARAS, MD, Stopped at 02/26/23 2038 .  levETIRAcetam  (KEPPRA ) IVPB 500 mg/100 mL premix, 500 mg, Intravenous, Q12H, [name], Brian D, PA-C, Stopped at 02/27/23 [phone] .  ondansetron  (ZOFRAN ) injection 4 mg, 4 mg, Intravenous, Q8H PRN, Antonetta Moccasin B, NP, 4 mg at 02/27/23 1759 .  Oral care mouth  rinse, 15 mL, Mouth Rinse, 4 times per day, Brian Toribio BROCKS, MD, 15 mL at 02/27/23 1132 .  Oral care mouth rinse, 15 mL, Mouth Rinse, PRN, Brian Toribio BROCKS, MD .  pantoprazole  (PROTONIX ) injection 40 mg, 40 mg, Intravenous, Q12H, Simpson, Paula B, NP .  sodium chloride  flush (NS) 0.9 % injection 3 mL, 3 mL, Intravenous, Q12H, Anner Alm ORN, MD, 3 mL at 02/27/23 0953 .  sodium chloride  flush (NS) 0.9 % injection 3 mL, 3 mL, Intravenous, PRN, Anner Alm ORN, MD, 3 mL at 02/26/23 2033  Allergies: Allergies  Allergen Reactions  . Codeine Other (See Comments)    Unknown reaction, severe headach  . Metformin  And Related Diarrhea    Rollene A Denyla Cortese

## 2023-02-27 NOTE — Telephone Encounter (Signed)
 RETURNED PATIENT'S SON'S PHONE CALL(Brian [name]), SPOKE WITH PATIENT'S SON Brian [name]

## 2023-02-27 NOTE — TOC Initial Note (Signed)
 Transition of Care [facility]) - Initial/Assessment Note    Patient Details  Name: Brian [name] MRN: [id number] Date of Birth: [birth year]  Transition of Care [facility]) CM/SW Contact:    Arlana JINNY Nicholaus ISRAEL Phone Number: [phone] 02/27/2023, 3:59 PM  Clinical Narrative:  3:59 PM- HF CSW attempted to meet with pt at bedside. During that time pt had an accident and everyone had to step out of the room. Pts cousin [name] stated to contact the pts sister, [name]. [name]'s number is [phone]. CSW called and the call was disconnected. CSW will attempt to make contact again. [name] stated that she is a good contact and can answer any questions related to the pt. CSW will attempt to meet with pt at a more appropriate time.   TOC will continue following.                     Patient Goals and CMS Choice            Expected Discharge Plan and Services                                              Prior Living Arrangements/Services                       Activities of Daily Living      Permission Sought/Granted                  Emotional Assessment              Admission diagnosis:  Hyperkalemia [E87.5] Shock (HCC) [R57.9] Symptomatic bradycardia [R00.1] AKI (acute kidney injury) (HCC) [N17.9] Bradycardia with 31-40 beats per minute [R00.1] Patient Active Problem List   Diagnosis Date Noted   Symptomatic bradycardia 02/26/2023   Seizure (HCC) 01/08/2023   Pericardial effusion 01/07/2023   Seizure-like activity (HCC) 01/07/2023   Gait disturbance 01/07/2023   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) 11/18/2022   Pleural effusion 10/30/2022   History of head and neck cancer 10/17/2022   Chronic congestive heart failure with left ventricular diastolic dysfunction (HCC) 11/19/2021   History of CVA with residual deficit 11/03/2021   Hypokalemia 11/03/2021    Dysphagia 11/03/2021   Fall at home, subsequent encounter 11/03/2021   Diastolic dysfunction 11/03/2021   Pancytopenia (HCC) 11/03/2021   Primary cancer of left lower lobe of lung (HCC) 06/13/2021   Normocytic normochromic anemia 03/20/2021   Stage 3b chronic kidney disease (HCC) 03/13/2021   Syncope and collapse 02/02/2021   Dilated cardiomyopathy (HCC) 02/02/2021   Weight loss, unintentional 05/26/2020   Insomnia 05/09/2020   Chronic kidney disease, stage III (moderate) (HCC) 05/09/2020   Chemotherapy induced diarrhea 05/09/2020   Malignant neoplasm of base of tongue (HCC) 03/02/2020   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 [facility])    Status epilepticus (HCC) 01/18/2013   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 status epilepticus, not intractable (HCC) 01/15/2013   Hyperlipidemia    Long term current use of anticoagulant 03/11/2010   Diabetes mellitus, type II (HCC) 12/20/2009   Essential hypertension, benign 12/20/2009   Atrial fibrillation (HCC) 12/20/2009   Systolic heart failure (HCC) 12/20/2009   CEREBROVASCULAR ACCIDENT 12/20/2009   PCP:  Duanne Butler DASEN, MD Pharmacy:   [id number] - [place], [place] - [address] [place] [address] Phone: [phone] Fax: [phone]     Social Drivers of Health (SDOH) Social History: SDOH Screenings   Food Insecurity: No Food Insecurity (01/07/2023)  Housing: Low Risk  (01/07/2023)  Transportation Needs: No Transportation Needs (01/07/2023)  Utilities: Not At Risk (01/07/2023)  Alcohol  Screen: Low Risk  (11/14/2021)  Depression (PHQ2-9): Low Risk  (04/05/2022)  Financial Resource Strain: Low Risk  (04/05/2022)  Physical Activity: Inactive (04/05/2022)  Social Connections: Socially Isolated (04/05/2022)  Stress: No Stress Concern Present (04/05/2022)  Tobacco Use: Low Risk  (01/07/2023)   SDOH Interventions:     Readmission Risk Interventions    11/08/2021    9:46 AM  Readmission Risk  Prevention Plan  Transportation Screening Complete  PCP or Specialist Appt within 3-5 Days Complete  HRI or Home Care Consult Complete  Social Work Consult for Recovery Care Planning/Counseling Complete  Palliative Care Screening Not Applicable  Medication Review Oceanographer) Complete

## 2023-02-27 NOTE — Progress Notes (Signed)
 Advanced Heart Failure Rounding Note  Cardiologist: Lynwood Schilling, MD  Chief Complaint: Disoriented Subjective:   Admitted with CHB. Pacer placed.   Alert to self this morning. Sitter at bedside.   Echo today: EF 60-65%  Not pacer dependent. HR 80s.   Objective:   Weight Range: 76 kg Body mass index is 24.74 kg/m.   Vital Signs:   Temp:  [93.9 F (34.4 C)-98.4 F (36.9 C)] 98.4 F (36.9 C) (01/08 0000) Pulse Rate:  [0-87] 80 (01/08 0700) Resp:  [10-24] 14 (01/08 0700) BP: (75-138)/(53-85) 134/82 (01/08 0700) SpO2:  [88 %-100 %] 96 % (01/08 0700) Weight:  [72.6 kg-76 kg] 76 kg (01/08 0500) Last BM Date :  (PTA)  Weight change: Filed Weights   02/26/23 1714 02/27/23 0500  Weight: 72.6 kg 76 kg    Intake/Output:   Intake/Output Summary (Last 24 hours) at 02/27/2023 0759 Last data filed at 02/27/2023 0700 Gross per 24 hour  Intake 1017.52 ml  Output 3735 ml  Net -2717.48 ml    Physical Exam  General:  elderly appearing.  No respiratory difficulty HEENT: normal Neck: supple. JVD ~8 cm. Carotids 2+ bilat; no bruits. No lymphadenopathy or thyromegaly appreciated. Cor: PMI nondisplaced. Regular rate & irregular rhythm. No rubs, gallops or murmurs. Lungs: clear Abdomen: soft, nontender, nondistended. No hepatosplenomegaly. No bruits or masses. Good bowel sounds. Extremities: no cyanosis, clubbing, rash, edema. R groin TVP GU: +foley Neuro: alert & oriented x 1, cranial nerves grossly intact. moves all 4 extremities w/o difficulty. Pleasantly disoriented   Telemetry   A fib 80s (Personally reviewed)    EKG    No new EKG to review  Labs    CBC Recent Labs    02/26/23 1551 02/26/23 1843 02/27/23 0252  WBC 4.0 5.4 3.6*  NEUTROABS 3.0  --   --   HGB 8.6* 10.0* 9.4*  HCT 27.7* 31.9* 29.6*  MCV 91.7 90.9 89.7  PLT 130* 140* 149*   Basic Metabolic Panel Recent Labs    98/92/74 1843 02/26/23 2326 02/27/23 0252  NA  --  135 137  K  --  5.9* 4.9   CL  --  107 107  CO2  --  22 21*  GLUCOSE  --  118* 71  BUN  --  45* 48*  CREATININE 2.69* 2.43* 2.60*  CALCIUM   --  9.0 9.2  MG 2.6*  --  2.6*  PHOS  --   --  4.9*   Liver Function Tests Recent Labs    02/26/23 1551  AST 13*  ALT 18  ALKPHOS 60  BILITOT 0.6  PROT 5.3*  ALBUMIN 2.9*   No results for input(s): LIPASE, AMYLASE in the last 72 hours. Cardiac Enzymes No results for input(s): CKTOTAL, CKMB, CKMBINDEX, TROPONINI in the last 72 hours.  BNP: BNP (last 3 results) Recent Labs    10/16/22 1146 02/26/23 1853  BNP 309* 303.0*    ProBNP (last 3 results) No results for input(s): PROBNP in the last 8760 hours.   D-Dimer Recent Labs    02/26/23 1551  DDIMER 0.86*   Hemoglobin A1C No results for input(s): HGBA1C in the last 72 hours. Fasting Lipid Panel No results for input(s): CHOL, HDL, LDLCALC, TRIG, CHOLHDL, LDLDIRECT in the last 72 hours. Thyroid  Function Tests Recent Labs    02/26/23 1552  TSH 1.760   Other results:  Imaging  DG Chest Port 1 View Result Date: 02/26/2023 CLINICAL DATA:  Complete heart block EXAM: PORTABLE  CHEST 1 VIEW COMPARISON:  Chest x-ray 02/26/2023 FINDINGS: The cardiomediastinal silhouette is stable, the heart is enlarged. There central pulmonary vascular congestion. Small left pleural effusion persists. No pneumothorax or acute fracture. IMPRESSION: 1. Cardiomegaly with central pulmonary vascular congestion. 2. Small left pleural effusion. Electronically Signed   By: Greig Pique M.D.   On: 02/26/2023 18:58   CARDIAC CATHETERIZATION Result Date: 02/26/2023   Anticipated discharge date to be determined.   No indication for antiplatelet therapy at this time . Successful Temporary Pacemaker Placement via RFV 6 Fr Sheath -> 62 cm, Rate 80 bpm, 5 mA (threshold 2 mA) The patient will be transported to the CVICU.  Will use right knee immobilizer as well as bilateral wrist and right ankle restraints to avoid  moving the pacemaker. Chest x-ray ordered to evaluate line positioning. Alm Clay, MD  CT HEAD WO CONTRAST ( ) Result Date: 02/26/2023 CLINICAL DATA:  Mental status change, unknown cause EXAM: CT HEAD WITHOUT CONTRAST TECHNIQUE: Contiguous axial images were obtained from the base of the skull through the vertex without intravenous contrast. RADIATION DOSE REDUCTION: This exam was performed according to the departmental dose-optimization program which includes automated exposure control, adjustment of the mA and/or kV according to patient size and/or use of iterative reconstruction technique. COMPARISON:  CT head and MRI head January 07, 2023. FINDINGS: Brain: Similar remote bilateral parieto-occipital infarcts. Remote left basal ganglia infarct. Advanced patchy white matter hypodensities, compatible with chronic microvascular ischemic disease. Vascular: Calcific atherosclerosis. No hyperdense vessel identified. Skull: No acute fracture. Sinuses/Orbits: Clear sinuses.  No acute orbital findings. Other: Right mastoid effusion, similar. IMPRESSION: 1. No evidence of acute intracranial abnormality. MRI could provide more sensitive evaluation for acute infarct if clinically warranted. 2. Remote infarcts and advanced chronic microvascular ischemic change. 3. Chronic right mastoid effusion. Electronically Signed   By: Gilmore GORMAN Molt M.D.   On: 02/26/2023 17:29   DG Chest Portable 1 View Result Date: 02/26/2023 CLINICAL DATA:  Bradycardia, hypotension. EXAM: PORTABLE CHEST 1 VIEW COMPARISON:  Chest x-ray 01/07/2023 FINDINGS: Heart is enlarged, unchanged. Evaluation of the left lower lung is obscured by overlying external artifact. There is likely a small left pleural effusion. Right lung is clear. No pneumothorax visualized. No acute osseous abnormality. IMPRESSION: 1. Evaluation of the left lower lung is obscured by overlying external artifact. There is likely a small left pleural effusion. 2. Cardiomegaly.  Electronically Signed   By: Greig Pique M.D.   On: 02/26/2023 16:16    Medications:   Scheduled Medications:  Chlorhexidine  Gluconate Cloth  6 each Topical Daily   mouth rinse  15 mL Mouth Rinse 4 times per day   sodium chloride  flush  3 mL Intravenous Q12H    Infusions:  sodium chloride      dexmedetomidine  (PRECEDEX ) IV infusion 0.6 mcg/kg/hr (02/27/23 0700)   epinephrine  Stopped (02/26/23 1859)   ferumoxytol  (FERAHEME) 510 mg in sodium chloride  0.9 % 100 mL IVPB Stopped (02/26/23 2038)   heparin  1,050 Units/hr (02/27/23 0700)   levETIRAcetam  Stopped (02/26/23 2018)    PRN Medications: sodium chloride , fentaNYL  (SUBLIMAZE ) injection, mouth rinse, sodium chloride  flush  Patient Profile   Brian [name] is a 68 y.o. male with chronic systolic heart failure, history of stroke, paroxysmal atrial fibrillation and hypertension. Admitted with CHB, AMS and suspected intentional drug overdose.   Assessment/Plan  CHB - 2/2 possible drug overdose? - TVP pacer placed with HR persistently in 30s - rate set to 50, not pacer dependent. rate increased to 80.  Suspect TVP can come out later today - wean off precedex  (on it with concerns at pulling TVP) - No BB.   Chronic systolic heart failure>>recovered EF - Echo 11/24: EF 45-50%, no RWMA, GIIDD, RV mildly reduced, trivial MR - Echo today EF 60-65% per Dr. Gardenia. Full official real pending.  - No BB - Previously on Jardiance , will hold off on restarting with current foley - Losartan  when tolerating PO and when renal function improves  PAF - In atrial fibrillation, rate controlled - Continue heparin  gtt - Plan to restart Eliquis  once TVP removed  - off dilt and BB  HTN - SBP stable (previously on hydral, losartan , diltiazem  and metoprolol ) - Avoid BB with CHB - Will need speech consult, when tolerating PO can add antihypertensives. Currently stable  AKI  - SCr 2.6, baseline 1.7-2.2 - avoid hypotension  Possible  suicidal ideation - sitter at bedside - per primary  Hyperkalemia - K up to 5.9 this admission - 4.9 today post lokelma   AMS/hx stroke - alert to self this morning.   May be ready for transfer to PCU later today if HR remains stable.   Length of Stay: 1  Beckey LITTIE Coe, NP  02/27/2023, 7:59 AM  Advanced Heart Failure Team Pager [phone] (M-F; 7a - 5p)  Please contact [facility] for night-coverage after hours (5p -7a ) and weekends on [email or web address]

## 2023-02-27 NOTE — Progress Notes (Addendum)
 PHARMACY - ANTICOAGULATION CONSULT NOTE  Pharmacy Consult for Heparin  Indication: atrial fibrillation  Allergies  Allergen Reactions   Codeine Other (See Comments)    Unknown reaction, severe headach   Metformin  And Related Diarrhea    Patient Measurements: Weight: 76 kg (167 lb 8.8 oz) Heparin  Dosing Weight: 73 kg  Vital Signs: Temp: 98.4 F (36.9 C) (01/08 0000) Temp Source: Axillary (01/08 0000) BP: 121/92 (01/08 0900) Pulse Rate: 91 (01/08 0900)  Labs: Recent Labs    02/26/23 1551 02/26/23 1843 02/26/23 2326 02/27/23 0252 02/27/23 0955  HGB 8.6* 10.0*  --  9.4*  --   HCT 27.7* 31.9*  --  29.6*  --   PLT 130* 140*  --  149*  --   APTT  --   --   --  60* 98*  HEPARINUNFRC  --   --   --  >1.10*  --   CREATININE 2.84* 2.69* 2.43* 2.60*  --   TROPONINIHS 12 13  --   --   --     Estimated Creatinine Clearance: 26.8 mL/min (A) (by C-G formula based on SCr of 2.6 mg/dL (H)).   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 Alvia, laser therapy OD   Seizures (HCC)    pt reports this is from low blood sugar    Medications:  Awaiting home med rec  Assessment: 68 y.o. M presented from [facility] with lethargy and then vomiting and long black capsule found in vomit - unknown capsule as it has zero markings and seems longer than a normal capsule. Pt with brady requiring temporary pacemaker. Pt was on apixaban  pta for afib (LD 1/7 AM). CT negative for acute findings.  aPTT came back therapeutic at 98, on heparin  infusion at 1050 units/hr. Hgb 9.4, plt 149. No s/sx of bleeding or infusion issues.   Goal of Therapy:  Heparin  level 0.3-0.7 units/ml aPTT 66-102 seconds Monitor  platelets by anticoagulation protocol: Yes   Plan:  Continue heparin  infusion at 1050 units/hr Will f/u aPTT in 8 hours Daily heparin  level, aPTT, and CBC  Thank you for allowing pharmacy to participate in this patient's care,  Suzen Sour, PharmD, BCCCP Clinical Pharmacist  Phone: [phone] 02/27/2023 11:12 AM  Please check AMION for all [facility] phone numbers After 10:00 PM, call Main Pharmacy [phone]  ADDENDUM Heparin  was paused for temp pacer removal - okay per cardiology to restart tonight at 9pm at previous rate of 1050 units/hr and get level 8 hr later.  Thank you for allowing pharmacy to participate in this patient's care,  Suzen Sour, PharmD, BCCCP Clinical Pharmacist

## 2023-02-27 NOTE — Telephone Encounter (Signed)
CALLED PATIENT TO ASK ABOUT RESCHEDULING TODAY'S FU, LVM FOR A RETURN CALL ?

## 2023-02-27 NOTE — Progress Notes (Signed)
 PCCM Interval Note   Notified pt has had several coffee ground emesis over last hour.  Pt not c/o of nausea. Not on PPI, pta on eliquis .  No O2 desaturations, remains on 1L.  P:  PPI BID  Hold heparin  gtt Hold PCU transfer for now H/Hq6 CXR Zofran  prn      Lyle Pesa, MSN, AG-ACNP-BC [facility] 02/27/2023, 5:57 PM  See Amion for pager If no response to pager , please call [phone] until 7pm After 7:00 pm call Elink  [phone]

## 2023-02-27 NOTE — Progress Notes (Signed)
 PHARMACY - ANTICOAGULATION CONSULT NOTE  Pharmacy Consult for heparin  Indication: atrial fibrillation  Labs: Recent Labs    02/26/23 1551 02/26/23 1843 02/26/23 2326 02/27/23 0252  HGB 8.6* 10.0*  --  9.4*  HCT 27.7* 31.9*  --  29.6*  PLT 130* 140*  --  149*  APTT  --   --   --  60*  HEPARINUNFRC  --   --   --  >1.10*  CREATININE 2.84* 2.69* 2.43* 2.60*  TROPONINIHS 12 13  --   --    Assessment/Plan:  68yo male slightly subtherapeutic on heparin  with initial dosing while DOAC on hold but suspect will continue to accumulate. Will continue infusion at current rate of 1050 units/hr and check additional PTT.  Marvetta Dauphin, PharmD, BCPS 02/27/2023 3:52 AM

## 2023-02-27 NOTE — Progress Notes (Signed)
 NAME:  Brian [name], MRN:  [id number], DOB:  1955-07-16, LOS: 1 ADMISSION DATE:  02/26/2023, CONSULTATION DATE:  02/26/23 REFERRING MD:  Dr. 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  Per EMS report, unlabeled bill bottle on side table with large black capsule, pt did admit to ingesting two pills initially.  Unclear what capsule is at this time. Found in aflutter rate 40s and SBPs 70's treated with NS 500ml, atropine  x 2, and then given no improvement, started on epi gtt.  Some concern for possible CCB/ BB OD, on diltiazem  360mg  ER, and toprol  xl 200mg  per MAR. Unclear if pt doses his own meds or facility distributes.  Daughter reports pt has not been himself and depressed since being placed at [facility] in November following hospitalization for breakthrough seizure.  Additionally, pt's mother passed away several weeks ago.  No hx of prior SI attempts or known verbalization of SI per daughter, hx of prior marijuana abuse.  Concerned that one of his friends may have brought him something.   In ER, pt afebrile, remains paced on epi gtt, and requiring 2L [place].  Pt oriented to self  otherwise confused and able to follow commands.  No further emesis.  Pt denies taking substance.  Labs noted for normal lactic, K 5.6, BUN/sCr 52/ 2.84, trop hs 12, normal WBC, H/H at baseline, plts 130.  CXR showing cardiomegaly, no obvious infiltrate.  No obvious focal deficits.  Given additional atropine , then calcium  chloride, total of 3gm without improvement.   PCCM to admit, Cardiology consulted by EDP and pt being taken for TVP.    Pertinent  Medical History   Past Medical History:  Diagnosis Date   Arthritis    Atrial fibrillation (HCC)    Cancer (HCC)    SCC base of tongue 2021   Cardiomyopathy    Cerebrovascular accident [facility])    2012   Diabetes mellitus    Hyperlipidemia    Hypertension    Left-sided weakness    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 [facility])    Dr. Alvia, laser therapy OD   Seizures [facility])    pt reports this is from low blood sugar   Significant Hospital Events: Including procedures, antibiotic start and stop dates in addition to other pertinent events   1/7 Admit symptomatic bradycardia, ?OD, cards s/p TVP  Interim History / Subjective:  TVP placed overnight. This AM, stable. No complaints. Is on 0.3 Precedex  and restraints. Says he never took a pill and does not recollect anyone brining him any pills. Mild hyperK overnight, resolved with Lokelma .  Objective   Blood pressure (!) 121/92, pulse 91, temperature 98.4 F (36.9 C), temperature source Axillary, resp. rate 14, weight 76 kg, SpO2 98%.        Intake/Output Summary (Last 24 hours) at 02/27/2023 1017 Last data filed at 02/27/2023 0900 Gross per 24 hour  Intake 1219.64 ml  Output 4385 ml  Net -3165.36 ml   Filed Weights   02/26/23 1714 02/27/23 0500  Weight: 72.6 kg 76 kg    Examination:  General: Elderly male, in NAD HEENT: [place]/AT. MM dry. EOMI Neuro:  A&O x 3, no deficits CV:  IRIR PULM:  non labored, clear anteriorly, few rales right base, no wheeze, 2L  GI: soft, bs hypo, NT Extremities: warm/dry, no LE edema  Skin: no obvious rashes, not able to assess posterior  - unclear black capsule on admission   Assessment & Plan:   Symptomatic bradycardia with concern for possible OD/ SI attempt vs CCB/ BB overdose. S/p TVP placement 02/26/23. Now resolved. - no response to atropine  or calcium  - pupils dilated but could be related to atropine   vs true toxidrome - daughter reports recent depression and concern for SI, no prior hx of  - UDS only positive for THC P:  - Continue supportive care - Hold PTA Diltiazem , Toprol -XL - SI precautions for now - Will place psych consult now that encephalopathy has resolved  Encephalopathy- metabolic/ toxic. CTH negative. UDS only positive for THC. Hx seizures- recently started back on keppra  12/2022 Hx CVA w/residual left hemiparesis - Continue PTA Keppra  - seizure precautions, no reports of clinical seizures, cont to monitor. - Defer EEG for now - Continue Precedex  for RASS goal 0, attempting to wean this AM - Continue restraints to prevent self harm - Safety sitter - Psych consult pending  AKI on CKD3b  Hyperkalemia - resolved Hypovolemia. - Give 1L bolus over 2 hours then start maintenance fluids until taking adequate PO - Follow BMP  Afib on Eliquis  - ? Underlying aflutter - Continue Heparin  gtt for now - Continue to hold PTA Eliquis   - Maintain K > 4, Mg > 2  HFrEF/ HFpEF EF 45-50%, G2DD, mild RV dysfunction HTN HLD - Continue PTA Atorvastatin  - Hold PTA Diltiazem , Empagliflozin , Hydralazine , Losartan , Toprol -XL  DMT2 - Continue SSI  - Hold PTA Empagliflozin   Anemia, thrombocytopenia. - Monitor - Transfuse for Hgb < 7  Best Practice (right click and Reselect all SmartList Selections daily)   Diet/type: Regular consistency (see orders) DVT prophylaxis systemic heparin  Pressure ulcer(s): N/A GI prophylaxis: N/A Lines: N/A Foley:  N/A Code Status:  full code Last date of multidisciplinary goals of care discussion [1/7]  Daughter updated by phone 1/7  Critical care time: 30 mins    Brian [name], Brian [name] For pager details, please see AMION or use Epic chat  After 1900, please call ELINK for cross coverage needs 02/27/2023, 10:33 AM

## 2023-02-27 NOTE — Plan of Care (Signed)
  Problem: Fluid Volume: Goal: Ability to maintain a balanced intake and output will improve Outcome: Progressing   Problem: Metabolic: Goal: Ability to maintain appropriate glucose levels will improve Outcome: Progressing   Problem: Tissue Perfusion: Goal: Adequacy of tissue perfusion will improve Outcome: Progressing   Problem: Clinical Measurements: Goal: Ability to maintain clinical measurements within normal limits will improve Outcome: Progressing Goal: Will remain free from infection Outcome: Progressing Goal: Diagnostic test results will improve Outcome: Progressing Goal: Respiratory complications will improve Outcome: Progressing Goal: Cardiovascular complication will be avoided Outcome: Progressing   Problem: Coping: Goal: Level of anxiety will decrease Outcome: Progressing   Problem: Elimination: Goal: Will not experience complications related to urinary retention Outcome: Progressing   Problem: Safety: Goal: Ability to remain free from injury will improve Outcome: Progressing   Problem: Safety: Goal: Non-violent Restraint(s) Outcome: Progressing   Problem: Cardiovascular: Goal: Ability to achieve and maintain adequate cardiovascular perfusion will improve Outcome: Progressing Goal: Vascular access site(s) Level 0-1 will be maintained Outcome: Progressing

## 2023-02-28 ENCOUNTER — Inpatient Hospital Stay (HOSPITAL_COMMUNITY): Payer: Medicare HMO

## 2023-02-28 ENCOUNTER — Encounter (HOSPITAL_COMMUNITY): Payer: Self-pay | Admitting: Internal Medicine

## 2023-02-28 DIAGNOSIS — N179 Acute kidney failure, unspecified: Secondary | ICD-10-CM

## 2023-02-28 DIAGNOSIS — I10 Essential (primary) hypertension: Secondary | ICD-10-CM | POA: Diagnosis not present

## 2023-02-28 DIAGNOSIS — F4321 Adjustment disorder with depressed mood: Secondary | ICD-10-CM | POA: Diagnosis not present

## 2023-02-28 DIAGNOSIS — I5020 Unspecified systolic (congestive) heart failure: Secondary | ICD-10-CM | POA: Diagnosis not present

## 2023-02-28 DIAGNOSIS — I442 Atrioventricular block, complete: Secondary | ICD-10-CM | POA: Diagnosis not present

## 2023-02-28 DIAGNOSIS — K567 Ileus, unspecified: Secondary | ICD-10-CM

## 2023-02-28 DIAGNOSIS — R001 Bradycardia, unspecified: Secondary | ICD-10-CM | POA: Diagnosis not present

## 2023-02-28 DIAGNOSIS — R195 Other fecal abnormalities: Secondary | ICD-10-CM

## 2023-02-28 DIAGNOSIS — F1290 Cannabis use, unspecified, uncomplicated: Secondary | ICD-10-CM | POA: Diagnosis not present

## 2023-02-28 DIAGNOSIS — I4891 Unspecified atrial fibrillation: Secondary | ICD-10-CM | POA: Diagnosis not present

## 2023-02-28 DIAGNOSIS — R5381 Other malaise: Secondary | ICD-10-CM

## 2023-02-28 DIAGNOSIS — E119 Type 2 diabetes mellitus without complications: Secondary | ICD-10-CM

## 2023-02-28 DIAGNOSIS — K920 Hematemesis: Secondary | ICD-10-CM | POA: Diagnosis not present

## 2023-02-28 DIAGNOSIS — E785 Hyperlipidemia, unspecified: Secondary | ICD-10-CM

## 2023-02-28 DIAGNOSIS — Z7901 Long term (current) use of anticoagulants: Secondary | ICD-10-CM

## 2023-02-28 LAB — BASIC METABOLIC PANEL
Anion gap: 9 (ref 5–15)
BUN: 43 mg/dL — ABNORMAL HIGH (ref 8–23)
CO2: 27 mmol/L (ref 22–32)
Calcium: 8.6 mg/dL — ABNORMAL LOW (ref 8.9–10.3)
Chloride: 102 mmol/L (ref 98–111)
Creatinine, Ser: 2.04 mg/dL — ABNORMAL HIGH (ref 0.61–1.24)
GFR, Estimated: 35 mL/min — ABNORMAL LOW (ref >60)
Glucose, Bld: 104 mg/dL — ABNORMAL HIGH (ref 70–99)
Potassium: 4.3 mmol/L (ref 3.5–5.1)
Sodium: 138 mmol/L (ref 135–145)

## 2023-02-28 LAB — LIPOPROTEIN A (LPA): Lipoprotein (a): 24.4 nmol/L (ref <75.0)

## 2023-02-28 LAB — GLUCOSE, CAPILLARY
Glucose-Capillary: 131 mg/dL — ABNORMAL HIGH (ref 70–99)
Glucose-Capillary: 72 mg/dL (ref 70–99)
Glucose-Capillary: 78 mg/dL (ref 70–99)
Glucose-Capillary: 84 mg/dL (ref 70–99)
Glucose-Capillary: 89 mg/dL (ref 70–99)
Glucose-Capillary: 90 mg/dL (ref 70–99)

## 2023-02-28 LAB — PHOSPHORUS: Phosphorus: 4.7 mg/dL — ABNORMAL HIGH (ref 2.5–4.6)

## 2023-02-28 LAB — CBC
HCT: 30.5 % — ABNORMAL LOW (ref 39.0–52.0)
Hemoglobin: 9.8 g/dL — ABNORMAL LOW (ref 13.0–17.0)
MCH: 28.6 pg (ref 26.0–34.0)
MCHC: 32.1 g/dL (ref 30.0–36.0)
MCV: 88.9 fL (ref 80.0–100.0)
Platelets: 150 10*3/uL (ref 150–400)
RBC: 3.43 MIL/uL — ABNORMAL LOW (ref 4.22–5.81)
RDW: 16.3 % — ABNORMAL HIGH (ref 11.5–15.5)
WBC: 3.6 10*3/uL — ABNORMAL LOW (ref 4.0–10.5)
nRBC: 0 % (ref 0.0–0.2)

## 2023-02-28 LAB — HEMOGLOBIN AND HEMATOCRIT, BLOOD
HCT: 29.8 % — ABNORMAL LOW (ref 39.0–52.0)
HCT: 31.5 % — ABNORMAL LOW (ref 39.0–52.0)
Hemoglobin: 10.3 g/dL — ABNORMAL LOW (ref 13.0–17.0)
Hemoglobin: 9.8 g/dL — ABNORMAL LOW (ref 13.0–17.0)

## 2023-02-28 LAB — MAGNESIUM: Magnesium: 2.4 mg/dL (ref 1.7–2.4)

## 2023-02-28 MED ORDER — METOPROLOL TARTRATE 5 MG/5ML IV SOLN
5.0000 mg | INTRAVENOUS | Status: DC | PRN
  Administered 2023-03-01 – 2023-03-03 (×8): 5 mg via INTRAVENOUS
  Filled 2023-02-28 (×9): qty 5

## 2023-02-28 MED ORDER — ORAL CARE MOUTH RINSE: 15.0000 mL | OROMUCOSAL | Status: DC | PRN

## 2023-02-28 NOTE — Discharge Instructions (Addendum)
 On 1/16 evening, take Metoprolol  Succinate 25mg  (half tablet of the 50mg ), then on 1/17 and on, take 1 tablet by mouth daily  ================================================  On behalf of the Psychiatry Consult team, it was a pleasure caring for you. Below are outlined additional information and resources for when you are discharged from [facility]:   -Recommend abstinence from alcohol , tobacco, marijuana, CBD and other illicit drug use at discharge.  -If your psychiatric symptoms worsen, or if you have side effects to your psychiatric medications, call your outpatient psychiatric provider, 911, [phone] or go to the nearest emergency department. -If suicidal thoughts recur, call your outpatient psychiatric provider, 911, [phone] or go to the nearest emergency department.   The [facility] will provide timely access to mental health services for children and adolescents (age 25 - 54) and adults presenting in a mental health crisis. The program is designed for those who need urgent behavioral health or substance use treatment and are not experiencing a medical crisis that would typically require an emergency room visit.   We also offer the following outpatient services: Individual Therapy Partial Hospitalization Program (PHP) Substance Abuse Intensive Outpatient Program [facility]) Specialized Intensive Adult Group Therapy Medication Management Peer Living Room  CONTACT INFORMATION Phone: [phone] Address: [address] [place], [place] [address] Hours: Open 24/7, No appointment required.

## 2023-02-28 NOTE — H&P (View-Only) (Signed)
 Consultation  Referring Provider:  [facility]  Primary Care Physician:  Duanne Butler DASEN, MD Primary Gastroenterologist:  Dr. Teressa       Reason for Consultation:    Coffee-ground emesis  LOS: 2 days          HPI:   Brian [name] is a 68 y.o. male with past medical history significant for A-fib on Eliquis  (systolic and diastolic heart failure, hypertension, hyperlipidemia, CKD 3B, seizure disorder, CVA with residual left hemiparesis, SCC of tongue, presenting for evaluation of coffee-ground emesis.  Patient presenting from [facility] by EMS after being found altered.  Patient was bradycardic and hypotensive and there was emesis nearby with half dissolved pill (large black collapsible).  Upon admission he was found to be bradycardic with rate in the 40s.  Systolic blood pressure in the 70s.  There was concern for CCB/BB overdose.  Chest x-ray showed cardiomegaly but no obvious infiltrate.  Patient was consulted by [facility] for admission and cardiology.  In ED patient was placed on epi gtt., oxygen 2L [employer], BUN 52, CR 2.84, troponin is normal.  Patient states last dose of his Eliquis  was 1/7.  He states he took a black pill that contained THC.  He states soon after he began having vomiting.  States he has never taken this black pill before.  Denies previous episodes of coffee-ground emesis.  Denies recurrent episodes of emesis since being admitted.  Denies abdominal pain, nausea, vomiting.  Denies heartburn.  Denies melena.  Denies change in bowel habits.  Patient denies previous EGD.  Currently has NG tube with 150 cc black material in canister that tested positive on Hemoccult.  Patient is adamant about refusing EGD.  Patient is oriented and states he insists he is not having a GI bleed and refuses endoscopic evaluation at this time.  Notable labs -Hgb 9.8 (down from 11.0 on admission).  Baseline appears 8-10. - BUN 43, CR 2.04, GFR 35 -- Hemodynamically stable  PREVIOUS GI WORKUP    Colonoscopy 07/2018 for history of polyps - One 3 mm polyp (hyperplastic) in the transverse colon, removed with a cold snare. Resected and retrieved.  - External and internal hemorrhoids.  - The examination was otherwise normal on direct and retroflexion views. - repeat 5 years  Colonoscopy 04/2015 - One 15 mm polyp (tubular adenoma) in the mid ascending colon, removed with a hot snare. Resected and retrieved.  - One 6 mm polyp (tubular adenoma) in the sigmoid colon, removed with a cold snare. Resected and retrieved. - The examination was otherwise normal.  Past Medical History:  Diagnosis Date  . Arthritis   . Atrial fibrillation (HCC)   . Cancer (HCC) 2021   SCC base of tongue  . Cardiomyopathy   . Cerebrovascular accident [facility]) 2012   R MCA  . Diabetes mellitus   . Hyperlipidemia   . Hypertension   . Left-sided weakness   . Lung cancer, lower lobe (HCC) 05/2021  . 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 Alvia, laser therapy OD  . Seizures (HCC)    pt reports this is from low blood sugar    Surgical History:  He  has a past surgical history that includes Ear surgery as a child; Tonsillectomy; Direct laryngoscopy (N/A, 02/10/2020); IR IMAGING GUIDED PORT INSERTION (03/09/2020); IR GASTROSTOMY TUBE MOD SED (03/23/2020); IR GASTROSTOMY TUBE REMOVAL/REPAIR (06/01/2020); Colonoscopy; IR REMOVAL TUN ACCESS W/ PORT W/O FL MOD SED (02/08/2021); Video bronchoscopy (Left,  10/30/2022); Video bronchoscopy with endobronchial ultrasound (Bilateral, 10/30/2022); Thoracentesis (10/30/2022); Bronchial needle aspiration biopsy (10/30/2022); and TEMPORARY PACEMAKER (N/A, 02/26/2023). Family History:  His family history includes Breast cancer in his sister; Colon cancer in his mother; Hyperlipidemia in his father; Hypertension in his father. Social History:   reports that he has never smoked. He has never been exposed to tobacco smoke. He has never used smokeless tobacco. He reports current  alcohol  use. He reports that he does not currently use drugs after having used the following drugs: Marijuana. Frequency: 7.00 times per week.  Prior to Admission medications   Medication Sig Start Date End Date Taking? Authorizing Provider  apixaban  (ELIQUIS ) 5 MG TABS tablet Take 1 tablet (5 mg total) by mouth 2 (two) times daily. 10/16/22  Yes Duanne Butler DASEN, MD  atorvastatin  (LIPITOR) 20 MG tablet Take 1 tablet (20 mg total) by mouth daily. Patient taking differently: Take 20 mg by mouth at bedtime. 05/28/22  Yes Duanne Butler DASEN, MD  diltiazem  (CARDIZEM  CD) 360 MG 24 hr capsule Take 1 capsule (360 mg total) by mouth daily. 01/10/23  Yes Ghimire, Donalda HERO, MD  empagliflozin  (JARDIANCE ) 25 MG TABS tablet Take 1 tablet (25 mg total) by mouth daily before breakfast. 10/19/22  Yes Duanne Butler DASEN, MD  hydrALAZINE  (APRESOLINE ) 50 MG tablet Take 1 tablet (50 mg total) by mouth 3 (three) times daily. 01/01/23  Yes Duanne Butler DASEN, MD  insulin  aspart (NOVOLOG  FLEXPEN) 100 UNIT/ML FlexPen 0-9 Units, Subcutaneous, 3 times daily with meals CBG < 70: Implement Hypoglycemia measures CBG 70 - 120: 0 units CBG 121 - 150: 1 unit CBG 151 - 200: 2 units CBG 201 - 250: 3 units CBG 251 - 300: 5 units CBG 301 - 350: 7 units CBG 351 - 400: 9 units CBG > 400: call MD 01/10/23  Yes Ghimire, Donalda HERO, MD  levETIRAcetam  (KEPPRA ) 500 MG tablet Take 1 tablet (500 mg total) by mouth 2 (two) times daily. 01/10/23  Yes Ghimire, Donalda HERO, MD  losartan  (COZAAR ) 100 MG tablet Take 0.5 tablets (50 mg total) by mouth daily. 01/10/23  Yes Ghimire, Donalda HERO, MD  Magnesium  200 MG TABS Take 400 mg by mouth daily.   Yes [provider]  metoprolol  succinate (TOPROL -XL) 200 MG 24 hr tablet Take 1 tablet (200 mg total) by mouth daily. Take with or immediately following a meal. 01/10/23  Yes Ghimire, Donalda HERO, MD  polyethylene glycol (MIRALAX  / GLYCOLAX ) 17 g packet Take 17 g by mouth daily as needed for mild constipation  or moderate constipation.   Yes [provider]  Continuous Blood Gluc Sensor (FREESTYLE LIBRE 2 SENSOR) MISC Use as directed to monitor blood glucose continuously. Change sensor Q 14 days. DX E11.65 04/11/20   Duanne Butler DASEN, MD  glucose blood test strip 1 each by Other route as needed for other. Use as instructed 03/10/20   Duanne Butler DASEN, MD    Current Facility-Administered Medications  Medication Dose Route Frequency Provider Last Rate Last Admin  . atorvastatin  (LIPITOR) tablet 20 mg  20 mg Oral Daily Desai, Rahul P, PA-C      . Chlorhexidine  Gluconate Cloth 2 % PADS 6 each  6 each Topical Daily Claudene Toribio BROCKS, MD   6 each at 02/28/23 1053  . ferumoxytol  (FERAHEME) 510 mg in sodium chloride  0.9 % 100 mL IVPB  510 mg Intravenous Weekly Macel Jayson PARAS, MD   Stopped at 02/26/23 2038  . levETIRAcetam  (KEPPRA ) IVPB 500  mg/100 mL premix  500 mg Intravenous Q12H [name], John D, PA-C   Stopped at 02/28/23 9187  . ondansetron  (ZOFRAN ) injection 4 mg  4 mg Intravenous Q8H PRN Simpson, Paula B, NP   4 mg at 02/27/23 1759  . Oral care mouth rinse  15 mL Mouth Rinse PRN Gretta Doffing P, DO      . pantoprazole  (PROTONIX ) injection 40 mg  40 mg Intravenous Q12H Simpson, Paula B, NP   40 mg at 02/28/23 1052  . sodium chloride  flush (NS) 0.9 % injection 3 mL  3 mL Intravenous Q12H Anner Alm ORN, MD   3 mL at 02/28/23 1053  . sodium chloride  flush (NS) 0.9 % injection 3 mL  3 mL Intravenous PRN Anner Alm ORN, MD   3 mL at 02/26/23 2033    Allergies as of 02/26/2023 - Review Complete 02/26/2023  Allergen Reaction Noted  . Codeine Other (See Comments)   . Metformin  and related Diarrhea 01/31/2021    Review of Systems  Constitutional:  Negative for chills, fever and weight loss.  HENT:  Negative for hearing loss and tinnitus.   Eyes:  Negative for blurred vision and double vision.  Respiratory:  Negative for cough and hemoptysis.   Cardiovascular:  Negative for chest pain and  palpitations.  Gastrointestinal:  Positive for nausea and vomiting. Negative for abdominal pain, blood in stool, constipation, diarrhea, heartburn and melena.  Genitourinary:  Negative for dysuria and urgency.  Musculoskeletal:  Negative for myalgias and neck pain.  Skin:  Negative for itching and rash.  Neurological:  Negative for seizures and loss of consciousness.  Psychiatric/Behavioral:  Negative for depression and suicidal ideas.        Physical Exam:  Vital signs in last 24 hours: Temp:  [98.8 F (37.1 C)-99.3 F (37.4 C)] 98.9 F (37.2 C) (01/09 0820) Pulse Rate:  [83-107] 104 (01/09 1100) Resp:  [8-34] 17 (01/09 1100) BP: (96-161)/(54-112) 124/88 (01/09 1100) SpO2:  [87 %-100 %] 98 % (01/09 1100) Weight:  [68.6 kg] 68.6 kg (01/09 0500) Last BM Date :  (PTA) Last BM recorded by nurses in past 5 days No data recorded  Physical Exam Constitutional:      Appearance: He is ill-appearing.  HENT:     Head: Normocephalic and atraumatic.     Nose: Nose normal.     Comments: NG tube with 150 cc dark material in canister    Mouth/Throat:     Mouth: Mucous membranes are moist.     Pharynx: Oropharynx is clear.  Eyes:     General: No scleral icterus.    Extraocular Movements: Extraocular movements intact.  Cardiovascular:     Rate and Rhythm: Normal rate and regular rhythm.  Pulmonary:     Effort: Pulmonary effort is normal. No respiratory distress.  Abdominal:     General: Bowel sounds are normal. There is no distension.     Palpations: Abdomen is soft. There is no mass.     Tenderness: There is no abdominal tenderness. There is no guarding or rebound.     Hernia: No hernia is present.  Musculoskeletal:        General: No swelling.     Cervical back: Normal range of motion and neck supple.  Skin:    General: Skin is warm and dry.     Coloration: Skin is pale.  Neurological:     General: No focal deficit present.     Mental Status: He is alert and oriented  to person,  place, and time.  Psychiatric:        Mood and Affect: Mood normal.        Behavior: Behavior normal.        Thought Content: Thought content normal.        Judgment: Judgment normal.      LAB RESULTS: Recent Labs    02/26/23 1843 02/27/23 0252 02/27/23 1929 02/28/23 0004 02/28/23 0639 02/28/23 0642  WBC 5.4 3.6*  --   --  3.6*  --   HGB 10.0* 9.4*   < > 10.3* 9.8* 9.8*  HCT 31.9* 29.6*   < > 31.5* 30.5* 29.8*  PLT 140* 149*  --   --  150  --    < > = values in this interval not displayed.   BMET Recent Labs    02/26/23 2326 02/27/23 0252 02/28/23 0639  NA 135 137 138  K 5.9* 4.9 4.3  CL 107 107 102  CO2 22 21* 27  GLUCOSE 118* 71 104*  BUN 45* 48* 43*  CREATININE 2.43* 2.60* 2.04*  CALCIUM  9.0 9.2 8.6*   LFT Recent Labs    02/26/23 1551  PROT 5.3*  ALBUMIN 2.9*  AST 13*  ALT 18  ALKPHOS 60  BILITOT 0.6   PT/INR No results for input(s): LABPROT, INR in the last 72 hours.  STUDIES: DG Abd 1 View Result Date: 02/27/2023 CLINICAL DATA:  Check gastric catheter placement EXAM: ABDOMEN - 1 VIEW COMPARISON:  None Available. FINDINGS: Gastric catheter extends into the stomach which is well distended with air. The proximal side port lies in the distal esophagus. This should be advanced several cm deeper into the stomach. Small left pleural effusion is noted. IMPRESSION: Gastric catheter as described. This should be advanced into the stomach. Electronically Signed   By: Oneil Devonshire M.D.   On: 02/27/2023 23:40   DG Chest Port 1 View Result Date: 02/27/2023 CLINICAL DATA:  Emesis EXAM: PORTABLE CHEST 1 VIEW COMPARISON:  02/26/2023 FINDINGS: Cardiac shadow is stable. Aortic calcifications are noted. New patchy airspace opacity is noted in the right mid lung with stable airspace opacity in the left mid lung. Mild vascular congestion remains. Small effusion is noted on the left. IMPRESSION: Increasing right-sided airspace opacity. The remainder of the exam is stable.  Electronically Signed   By: Oneil Devonshire M.D.   On: 02/27/2023 22:30   ECHOCARDIOGRAM COMPLETE Result Date: 02/27/2023    ECHOCARDIOGRAM REPORT   Patient Name:   VAISHNAV DEMARTIN Date of Exam: 02/27/2023 Medical Rec #:  [id number]         Height:       69.0 in Accession #:    [id number]        Weight:       167.5 lb Date of Birth:  24-Jun-1955          BSA:          1.916 m Patient Age:    67 years          BP:           134/82 mmHg Patient Gender: M                 HR:           90 bpm. Exam Location:  Inpatient Procedure: 2D Echo, Cardiac Doppler, Color Doppler and Intracardiac            Opacification Agent Indications:    Heart Block, Complete  History:        Patient has prior history of Echocardiogram examinations, most                 recent 01/07/2023. Arrythmias:Atrial Fibrillation; Risk                 Factors:Hypertension, Diabetes and Dyslipidemia.  Sonographer:    Ozell Free Referring Phys: 26 Pratt W HARDING  Sonographer Comments: Technically challenging study due to limited acoustic windows. IMPRESSIONS  1. Cannot fully assess wall motion. Left ventricular ejection fraction, by estimation, is 60 to 65%. The left ventricle has normal function. There is moderate concentric left ventricular hypertrophy. Left ventricular diastolic parameters are indeterminate.  2. Right ventricular systolic function is mildly reduced. The right ventricular size is mildly enlarged. Tricuspid regurgitation signal is inadequate for assessing PA pressure.  3. Left atrial size was moderately dilated.  4. Right atrial size was mildly dilated.  5. The mitral valve was not well visualized. No evidence of mitral valve regurgitation.  6. The aortic valve was not well visualized. Aortic valve regurgitation is not visualized.  7. The inferior vena cava is dilated in size with >50% respiratory variability, suggesting right atrial pressure of 8 mmHg. FINDINGS  Left Ventricle: Cannot fully assess wall motion. Left ventricular ejection  fraction, by estimation, is 60 to 65%. The left ventricle has normal function. Definity  contrast agent was given IV to delineate the left ventricular endocardial borders. The left ventricular internal cavity size was normal in size. There is moderate concentric left ventricular hypertrophy. Left ventricular diastolic parameters are indeterminate. Right Ventricle: The right ventricular size is mildly enlarged. Right ventricular systolic function is mildly reduced. Tricuspid regurgitation signal is inadequate for assessing PA pressure. Left Atrium: Left atrial size was moderately dilated. Right Atrium: Right atrial size was mildly dilated. Pericardium: There is no evidence of pericardial effusion. Mitral Valve: The mitral valve was not well visualized. No evidence of mitral valve regurgitation. Tricuspid Valve: Tricuspid valve regurgitation is not demonstrated. Aortic Valve: The aortic valve was not well visualized. Aortic valve regurgitation is not visualized. Aortic valve mean gradient measures 1.7 mmHg. Aortic valve peak gradient measures 3.1 mmHg. Aortic valve area, by VTI measures 2.82 cm. Pulmonic Valve: Pulmonic valve regurgitation is not visualized. Aorta: The aortic root and ascending aorta are structurally normal, with no evidence of dilitation. Venous: The inferior vena cava is dilated in size with greater than 50% respiratory variability, suggesting right atrial pressure of 8 mmHg. IAS/Shunts: The interatrial septum was not well visualized.  LEFT VENTRICLE PLAX 2D LVIDd:         4.00 cm   Diastology LVIDs:         2.40 cm   LV e' medial:    4.93 cm/s LV PW:         1.30 cm   LV E/e' medial:  17.6 LV IVS:        1.30 cm   LV e' lateral:   7.29 cm/s LVOT diam:     2.00 cm   LV E/e' lateral: 11.9 LV SV:         43 LV SV Index:   22 LVOT Area:     3.14 cm  RIGHT VENTRICLE            IVC RV Basal diam:  4.30 cm    IVC diam: 2.60 cm RV S prime:     6.56 cm/s TAPSE (M-mode): 1.6 cm LEFT ATRIUM  Index        RIGHT ATRIUM           Index LA diam:        4.30 cm  2.24 cm/m   RA Area:     24.10 cm LA Vol (A2C):   68.1 ml  35.54 ml/m  RA Volume:   76.00 ml  39.66 ml/m LA Vol (A4C):   105.0 ml 54.79 ml/m LA Biplane Vol: 86.1 ml  44.93 ml/m  AORTIC VALVE AV Area (Vmax):    2.38 cm AV Area (Vmean):   2.40 cm AV Area (VTI):     2.82 cm AV Vmax:           88.00 cm/s AV Vmean:          60.667 cm/s AV VTI:            0.153 m AV Peak Grad:      3.1 mmHg AV Mean Grad:      1.7 mmHg LVOT Vmax:         66.63 cm/s LVOT Vmean:        46.433 cm/s LVOT VTI:          0.137 m LVOT/AV VTI ratio: 0.90  AORTA Ao Root diam: 3.40 cm Ao Asc diam:  3.30 cm MITRAL VALVE MV Area (PHT): 3.92 cm    SHUNTS MV Decel Time: 194 msec    Systemic VTI:  0.14 m MV E velocity: 86.80 cm/s  Systemic Diam: 2.00 cm Ronal Ross Electronically signed by Ronal Ross Signature Date/Time: 02/27/2023/12:35:35 PM    Final    DG Chest Port 1 View Result Date: 02/26/2023 CLINICAL DATA:  Complete heart block EXAM: PORTABLE CHEST 1 VIEW COMPARISON:  Chest x-ray 02/26/2023 FINDINGS: The cardiomediastinal silhouette is stable, the heart is enlarged. There central pulmonary vascular congestion. Small left pleural effusion persists. No pneumothorax or acute fracture. IMPRESSION: 1. Cardiomegaly with central pulmonary vascular congestion. 2. Small left pleural effusion. Electronically Signed   By: Greig Pique M.D.   On: 02/26/2023 18:58   CARDIAC CATHETERIZATION Result Date: 02/26/2023 .  Anticipated discharge date to be determined. .  No indication for antiplatelet therapy at this time . Successful Temporary Pacemaker Placement via RFV 6 Fr Sheath -> 62 cm, Rate 80 bpm, 5 mA (threshold 2 mA) The patient will be transported to the CVICU.  Will use right knee immobilizer as well as bilateral wrist and right ankle restraints to avoid moving the pacemaker. Chest x-ray ordered to evaluate line positioning. Alm Clay, MD  CT HEAD WO CONTRAST  ( ) Result Date: 02/26/2023 CLINICAL DATA:  Mental status change, unknown cause EXAM: CT HEAD WITHOUT CONTRAST TECHNIQUE: Contiguous axial images were obtained from the base of the skull through the vertex without intravenous contrast. RADIATION DOSE REDUCTION: This exam was performed according to the departmental dose-optimization program which includes automated exposure control, adjustment of the mA and/or kV according to patient size and/or use of iterative reconstruction technique. COMPARISON:  CT head and MRI head January 07, 2023. FINDINGS: Brain: Similar remote bilateral parieto-occipital infarcts. Remote left basal ganglia infarct. Advanced patchy white matter hypodensities, compatible with chronic microvascular ischemic disease. Vascular: Calcific atherosclerosis. No hyperdense vessel identified. Skull: No acute fracture. Sinuses/Orbits: Clear sinuses.  No acute orbital findings. Other: Right mastoid effusion, similar. IMPRESSION: 1. No evidence of acute intracranial abnormality. MRI could provide more sensitive evaluation for acute infarct if clinically warranted. 2. Remote infarcts and advanced chronic microvascular ischemic change. 3. Chronic right  mastoid effusion. Electronically Signed   By: Gilmore GORMAN Molt M.D.   On: 02/26/2023 17:29   DG Chest Portable 1 View Result Date: 02/26/2023 CLINICAL DATA:  Bradycardia, hypotension. EXAM: PORTABLE CHEST 1 VIEW COMPARISON:  Chest x-ray 01/07/2023 FINDINGS: Heart is enlarged, unchanged. Evaluation of the left lower lung is obscured by overlying external artifact. There is likely a small left pleural effusion. Right lung is clear. No pneumothorax visualized. No acute osseous abnormality. IMPRESSION: 1. Evaluation of the left lower lung is obscured by overlying external artifact. There is likely a small left pleural effusion. 2. Cardiomegaly. Electronically Signed   By: Greig Pique M.D.   On: 02/26/2023 16:16      Impression    Coffee-ground  emesis Coffee-ground emesis after ingesting black pill containing THC.  Occult positive.   Hgb 9.8 (down from 11.0).  Baseline appears 8-10 BUN 43, CR 2.04, GFR 35 Patient adamantly declines EGD.  Patient is alert and oriented.  Patient is informed and aware that if he decompensates and has further bleeding then this procedure can become emergent.  He is aware that there is concern for upper GI bleed requiring endoscopic evaluation but continues to decline EGD.  Symptomatic bradycardia A-fib on Eliquis  S/p TVP placement 02/26/2023 no removed Last dose Eliquis  1/7  HFrEF EF 45 to 50%    Plan   - PPI 40 Mg IV twice daily - Continue daily CBC and transfuse as needed to maintain HGB > 8 - Patient adamantly declines EGD at this time.  If patient changes his mind or worsening hemoglobin/further bleeding please call us  back.  GI will likely sign off for now secondary to patient declining endoscopic intervention  Thank you for your kind consultation   Nestor CHRISTELLA Blower  02/28/2023, 11:57 AM    Attending physician's note  I have taken a history, reviewed the chart and examined the patient. I performed a substantive portion of this encounter, including complete performance of at least one of the key components, in conjunction with the APP. I agree with the APP's note, impression and recommendations.    68 year old male with A-fib on Eliquis , CKD, history of CVA with left hemiparesis admitted with symptomatic bradycardia and hypotension. He has been having multiple episodes of coffee-ground emesis, NG tube placed with dark fluid. Discussed possible EGD to further evaluate nausea, vomiting and coffee-ground emesis.  Will need to exclude gastroduodenal ulcer with possible gastric outlet obstruction. Patient refuses to undergo any procedure or any treatment.  He was not even interested in having a conversation.  Daughter arrived while I was discussing the plan, I started discussing plan with her, mid  conversation she stepped out with a phone call. Per psych eval patient has capacity Please reconsult GI if patient changes his mind    K. Veena Cristina Mattern , MD [phone]

## 2023-02-28 NOTE — Progress Notes (Signed)
 eLink Physician-Brief Progress Note Patient Name: Brian [name] DOB: [birth year] MRN: [id number]   Date of Service  02/28/2023  HPI/Events of Note  68 y.o. male with past medical history significant for A-fib on Eliquis  (systolic and diastolic heart failure, hypertension, hyperlipidemia, CKD 3B, seizure disorder, CVA with residual left hemiparesis, SCC of tongue, presenting for evaluation of coffee-ground emesis.   Afib rates 110-130  eICU Interventions  Add metop IV pushes for sustained HR > 130     Intervention Category Intermediate Interventions: Arrhythmia - evaluation and management  [name] 02/28/2023, 11:25 PM

## 2023-02-28 NOTE — Plan of Care (Signed)

## 2023-02-28 NOTE — Progress Notes (Signed)
 NAME:  Brian [name], MRN:  [id number], DOB:  1955/09/24, LOS: 2 ADMISSION DATE:  02/26/2023, CONSULTATION DATE:  02/26/23 REFERRING MD:  Dr. 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  Per EMS report, unlabeled bill bottle on side table with large black capsule, pt did admit to ingesting two pills initially.  Unclear what capsule is at this time. Found in aflutter rate 40s and SBPs 70's treated with NS 500ml, atropine  x 2, and then given no improvement, started on epi gtt.  Some concern for possible CCB/ BB OD, on diltiazem  360mg  ER, and toprol  xl 200mg  per MAR. Unclear if pt doses his own meds or facility distributes.  Daughter reports pt has not been himself and depressed since being placed at [facility] in November following hospitalization for breakthrough seizure.  Additionally, pt's mother passed away several weeks ago.  No hx of prior SI attempts or known verbalization of SI per daughter, hx of prior marijuana abuse.  Concerned that one of his friends may have brought him something.   In ER, pt afebrile, remains paced on epi gtt, and requiring 2L [place].  Pt oriented to self  otherwise confused and able to follow commands.  No further emesis.  Pt denies taking substance.  Labs noted for normal lactic, K 5.6, BUN/sCr 52/ 2.84, trop hs 12, normal WBC, H/H at baseline, plts 130.  CXR showing cardiomegaly, no obvious infiltrate.  No obvious focal deficits.  Given additional atropine , then calcium  chloride, total of 3gm without improvement.   PCCM to admit, Cardiology consulted by EDP and pt being taken for TVP.    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 Alvia, laser therapy OD   Seizures [facility])    pt reports this is from low blood sugar   Significant Hospital Events: Including procedures, antibiotic start and stop dates in addition to other pertinent events   1/7 Admit symptomatic bradycardia, ?OD, cards s/p TVP 1/8 TVP removed, dark vomit in afternoon so transfer cancelled  Interim History / Subjective:  This morning he denies complaints.   Objective   Blood pressure 111/72, pulse 97, temperature 98.8 F (37.1 C), temperature source Axillary, resp. rate 18, height 5' 9 (1.753 m), weight 68.6 kg, SpO2 97%.        Intake/Output Summary (Last 24 hours) at 02/28/2023 0751 Last data filed at 02/28/2023 0622 Gross per 24 hour  Intake 1400.66 ml  Output 4175 ml  Net -2774.34 ml   Filed Weights   02/26/23 1714 02/27/23 0500 02/28/23 0500  Weight: 72.6 kg 76 kg 68.6 kg    Examination: General: chronically ill  appearing man lying in bed in NAD HEENT: [place]/AT, eyes anicteric, NGT Neuro:  sleepy but arousable, moving all extremities CV:  reg rate, irreg rhtyhm PULM:  breathing comfortably on [place], CTAB GI: soft, NT Extremities: no LE edema, no bleeding from TVP site Skin: no rashes, skin warm & dry  KUB personally reviewed> NGT in stomach; fenestration overlying GE junction, air-filled stomach. Small left effusion.  BUN 43 Cr 2.04 Hb 10.3> 9.8>9.8 Platelets 150  Resolved problem list:   Hyperkalemia - resolved  Assessment & Plan:   Symptomatic bradycardia with  concern for possible CCB/ BB overdose.  Known THC ingestion.  S/p TVP placement 02/26/23, now removed. Afib on Eliquis  -holding AC due to concern for possible GIB -tele monitoring -holding heparin  for now; con't to hold Eliquis  -monitor electrolytes, replete as needed  HFrEF/ HFpEF EF 45-50%, G2DD, mild RV dysfunction HTN HLD -hold oral atorvastatin  until able to take PO -eventual goal low dose coreg ; would hold CCB per cardiolgoy -hold antihypertensives -avoid THC use -appreciate cardiology's management   Ileus Nausea and vomiting- unclear if this was blood or just dark material.  H/H stable -NGT to suction; monitor volume of output -hemoccult sample -con't PPI BID  Acute metabolic encephalopathy due to toxic ingestion- resolved Hx seizures- recently started back on keppra  12/2022 Hx CVA w/residual left hemiparesis -con't PTA keppra  -recommended to stop using THC supplements -appreciate psychiatry's management  AKI on CKD3b - improving -strict I/O -renally dose meds, avoid nephrotoxic meds  DMT2, controlled hyperglycemia. A1c 6.5 -hold PTA jardiance  -SSI PRN -goal BG 140-180  Anemia, thrombocytopenia. -monitor -transfuse for Hb <7 or hemodynamically significant bleeding  Dysphagia -hold on swallow eval until vomiting and ileus resolve  Debility -PT, OT  Best Practice (right click and Reselect all SmartList Selections daily)   Diet/type: NPO DVT prophylaxis SCD Pressure ulcer(s): N/A GI prophylaxis: N/A Lines: N/A Foley:  N/A Code Status:  full code Last date of multidisciplinary goals of care discussion [1/7]  Daughter updated by phone 1/7  Critical care time:      Brian SHAUNNA Gaskins, DO 02/28/23 9:09 AM  Pulmonary & Critical Care  For contact information, see Amion. If no response to pager, please call PCCM consult pager. After hours, 7PM- 7AM, please call Elink.

## 2023-02-28 NOTE — Consult Note (Signed)
 [facility] Psychiatric Consult Initial  Patient Name: .Brian [name]  MRN: [id number]  DOB: 1955-11-19  Consult Order details: Desai/suicidal ideation  Mode of Visit: In person    Psychiatry Consult Evaluation  Service Date: February 28, 2023 LOS:  LOS: 2 days  Chief Complaint I got stupid and shouldn't have taken that black pill  Primary Psychiatric Diagnoses  Marijuana use disorder 2.   Adjustment disorder vs MDD vs complicated grief   Assessment  Brian [name] is a 68 y.o. male admitted: Medically for 02/26/2023  3:16 PM for AMS. He carries the psychiatric diagnoses of no formal diagnoses and has a past medical history of squamous cell lung cancer, cardiomyopathy, arthritis, afib, HTN, HLD, diabetes, L sided weakness, seizures.   His current presentation of use of marijuana despite prior health consequences is most consistent with a substance use disorder.  Separately, his family notes concern for depression after the death of his mother and diagnosis of his own lung cancer.  He has no premorbid psychiatric history, has consistently denied that this is a suicide attempt, and while family would like for him to get help and potentially be started on medications they have no concern that this was a suicide attempt.   Current outpatient psychotropic medications include nothing On initial examination, patient was very mildly confused as evidenced by bedside neurocognitive testing but gave appropriate answers to all questions - deferred many non-essential questions given concerns about pt being unreliable historian.   02/28/2023: Patient seen in bed today.  He arouses easily to name, and is oriented to person, time, place and situation.  He specifically denies any symptoms of depression, SI, HI, AVH.  He does endorse social stressor of having to find a new place to live after the death of his mother.  Patient states that he was living with mother, but now will have to find a new place.  He  states that he is able to live independently.  Patient does feel supported by his daughter, and states that he has friends. He declines offer for antidepressant medications. He intends to continue marijuana, but states he will be more careful about taking CBD pills. Cessation of all THC/CBD is advised. Reviewed that he would be provided with resources for outpatient therapy and medication management if he desires.   Collateral obtained from patient's daughter, Brian [name].  Brian [name] again voices no safety concerns for patient.  She states that he resides at [facility], and has been there since his last hospital discharge 01/10/2023.  Daughter states that patient has continued to show signs of dementia, and often is not aware of where he lives.  Patient is able to return to [facility] once he is medically cleared.  She has coordinated with staff that patient must have all visitors emptied their pockets to ensure that patient is not being provided any contraband.  Daughter is in the process of completing legal paperwork to become patient's power of attorney and/or guardian if needed.  She is aware that if she obtains guardianship, she could approve of antidepressants.  She is agreeable to patient expressing his own decisions now for medication management, noting that he is feeling frustrated with being in the hospital.  She will continue to encourage patient to seek treatment for suspected underlying mood disorder.  Please see plan below for detailed recommendations.   Diagnoses:  Active Hospital problems: Principal Problem:   Symptomatic bradycardia Active Problems:   Ileus (HCC)   Debility   AKI (acute  kidney injury) (HCC)   Adjustment disorder with depressed mood   Marijuana use    Plan   ## Psychiatric Medication Recommendations:  -- patient refuses antidepressant therapy  ## Medical Decision Making Capacity: Not specifically addressed in this encounter  ## Further Work-up:  -- none  currently - could consider folate  -- most recent EKG on 1/8 had QtC of 467 in setting of bradycardia; interpret with caution -- Pertinent labwork reviewed earlier this admission includes: tsh wnl, b12 wnl   ## Disposition:-- There are no psychiatric contraindications to discharge at this time  ## Behavioral / Environmental: - No specific recommendations at this time.  TOC consult for d/c planning.  Per daughter, patient can return to his living facility, [facility]    ## Safety and Observation Level:  - Based on my clinical evaluation, I estimate the patient to be at low risk of self harm in the current setting. - At this time, we recommend  routine observation. This decision is based on my review of the chart including patient's history and current presentation, interview of the patient, mental status examination, and consideration of suicide risk including evaluating suicidal ideation, plan, intent, suicidal or self-harm behaviors, risk factors, and protective factors. This judgment is based on our ability to directly address suicide risk, implement suicide prevention strategies, and develop a safety plan while the patient is in the clinical setting. Please contact our team if there is a concern that risk level has changed.  CSSR Risk Category:   Suicide Risk Assessment: Patient has following modifiable risk factors for suicide: under treated depression  and lack of access to outpatient mental health resources, which we are addressing by evaluating again tomorrow, offering medication/grief counseling prior to dc. Patient has following non-modifiable or demographic risk factors for suicide: male gender and separation or divorce, recent loss Patient has the following protective factors against suicide: Supportive family, Cultural, spiritual, or religious beliefs that discourage suicide, and no history of suicide attempts, grandchildren, future orientation,   Thank you for this consult  request. Recommendations have been communicated to the primary team.   Psychiatry will sign off.  Please re-consult for any future acute psychiatric concerns.   While future psychiatric events cannot be accurately predicted, the patient does not currently require acute inpatient psychiatric care and does not currently meet [facility]  involuntary commitment criteria.   DOROTHE CHRISTELLA GRIFFINS, MD       History of Present Illness  Relevant Aspects of [facility] Course:  Admitted on 02/26/2023 for AMS/bradycardia. They were found to have swallowed an unknown capsule.   Patient Report:  Patient seen this morning.  He specifically denies any depression, SI, HI, AVH.  He is remorseful for taking the pills his friends brought.  He does wish to continue marijuana if able.  He declines antidepressant medication.  He is open to having resources for psychiatry and therapy in the future should he desire.  Collateral from daughter, states that she can provide him THC/CBD from a vape shop if he is approved for medical marijuana use with his diagnosis of lung cancer.  She has safety planned with his living facility so that he cannot have contraband brought in by friends.  She states that she will become his medical POA.  She notes that he has 7 grandchildren from baby to 21 years whom he is involved in their lives.    Previously endorsed several weeks of low mood after the death of his mother.  Reminisces  about how meaningful she was to him and is grateful that he got 69 years with her before she passed.  He does not feel that he is depressed and declined medication at this time.    Psych ROS:  Depression: Patient endorsed low mood which he feels is in proportion to his recent loss. Anxiety: Denied Mania (lifetime and current): Denied Psychosis: (lifetime and current): Denied  Collateral information 02/27/2023:  Spoke to nurse-he told her a couple of hours ago that he swallowed marijuana pills to get  high which she corroborated with daughter. Spoke to daughter over the phone-he told her the same story.  She has been worried about depression but has no concern for suicidality.  His guns are being locked up as a precaution already by her brother.  He told her this morning that he would be willing to consider antidepressants.  Per primary team more alert now than this AM.  Daughter also shares that in the past he has had hospital admissions for seizures after marijuana use.  She is concerned that is impacting his care for his lung cancer and hopes that if he agrees to treatment for underlying depression use will decline.  ROS relevant psychiatric ROS above  Psychiatric and Social History  Psychiatric History:  Information collected from patient  Prev Dx/Sx: None Current Psych Provider: None Home Meds (current): None Previous Med Trials: None Therapy: Denies  Prior Psych Hospitalization: No Prior Self Harm: No Prior Violence: Deferred  Family Psych History: Minimal Family Hx suicide: Deferred  Social History:  Developmental Hx: Deferred Educational Hx: Deferred Occupational Hx: Deferred Legal Hx: Deferred Living Situation: Currently in a care facility Spiritual Hx: Patient believes in God but is unsure as to whether he thinks suicide is a sin or not.  I would have to think on that 1. Access to weapons/lethal means: Patient states that he owns several guns which are locked up and unloaded.  Daughter states his guns are being removed. Patient is not a living facility and does not have access to his weapons  Substance History obtained 02/27/2023 Alcohol : Remote (years) Type of alcohol  N/A Last Drink N/A Number of drinks per day N/A History of alcohol  withdrawal seizures deferred History of DT's deferred Tobacco: Denied Illicit drugs: Honey, the only things they have ever done have been weed and alcohol  and I quit alcohol  years ago. Prescription drug abuse: Denied Rehab hx:  Deferred  Exam Findings  Physical Exam:  Vital Signs:  Temp:  [98.8 F (37.1 C)-99.3 F (37.4 C)] 98.9 F (37.2 C) (01/09 0820) Pulse Rate:  [83-107] 104 (01/09 1100) Resp:  [8-34] 17 (01/09 1100) BP: (96-161)/(54-112) 124/88 (01/09 1100) SpO2:  [87 %-100 %] 98 % (01/09 1100) Weight:  [68.6 kg] 68.6 kg (01/09 0500) Blood pressure 124/88, pulse (!) 104, temperature 98.9 F (37.2 C), temperature source Axillary, resp. rate 17, height 5' 9 (1.753 m), weight 68.6 kg, SpO2 98%. Body mass index is 22.33 kg/m.  Physical Exam Constitutional:      Appearance: He is ill-appearing.  HENT:     Head: Normocephalic.  Cardiovascular:     Rate and Rhythm: Normal rate and regular rhythm.  Pulmonary:     Effort: Pulmonary effort is normal. No respiratory distress.  Neurological:     Mental Status: He is alert.     Mental Status Exam: General Appearance:  Ill appearing  Orientation:  Full (Time, Place, and Person)  Memory:  Immediate;   Fair Recent;   Poor Remote;  Good  Concentration:  Concentration: Fair  Recall:  Fair  Attention  Fair  Eye Contact:  Good  Speech:  Clear and Coherent  Language:  Good  Volume:  Normal  Mood: Sad  Affect:  Congruent  Thought Process:  Coherent  Thought Content:   devoid of delusions/paranoia  Suicidal Thoughts:  No  Homicidal Thoughts:  No  Judgement:  Poor  Insight:  Shallow  Psychomotor Activity:  Normal  Akathisia:  No  Fund of Knowledge:  Fair   Other History   These have been pulled in through the EMR, reviewed, and updated if appropriate.  Family History:  The patient's family history includes Breast cancer in his sister; Colon cancer in his mother; Hyperlipidemia in his father; Hypertension in his father.  Medical History: Past Medical History:  Diagnosis Date  . Arthritis   . Atrial fibrillation (HCC)   . Cancer (HCC) 2021   SCC base of tongue  . Cardiomyopathy   . Cerebrovascular accident [facility]) 2012   R MCA  . Diabetes  mellitus   . Hyperlipidemia   . Hypertension   . Left-sided weakness   . Lung cancer, lower lobe (HCC) 05/2021  . 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 Alvia, laser therapy OD  . Seizures (HCC)    pt reports this is from low blood sugar    Surgical History: Past Surgical History:  Procedure Laterality Date  . BRONCHIAL NEEDLE ASPIRATION BIOPSY  10/30/2022   Procedure: BRONCHIAL NEEDLE ASPIRATION BIOPSIES;  Surgeon: Brenna Adine CROME, DO;  Location: [facility] ENDOSCOPY;  Service: Cardiopulmonary;;  . COLONOSCOPY    . DIRECT LARYNGOSCOPY N/A 02/10/2020   Procedure: DIRECT LARYNGOSCOPY WITH BIOPSY;  Surgeon: [name] Clunes, MD;  Location: [facility] OR;  Service: ENT;  Laterality: N/A;  . Ear surgery as a child    . IR GASTROSTOMY TUBE MOD SED  03/23/2020  . IR GASTROSTOMY TUBE REMOVAL  06/01/2020  . IR IMAGING GUIDED PORT INSERTION  03/09/2020  . IR REMOVAL TUN ACCESS W/ PORT W/O FL MOD SED  02/08/2021  . TEMPORARY PACEMAKER N/A 02/26/2023   Procedure: TEMPORARY PACEMAKER;  Surgeon: Anner Alm ORN, MD;  Location: [facility] INVASIVE CV LAB;  Service: Cardiovascular;  Laterality: N/A;  . THORACENTESIS  10/30/2022   Procedure: THORACENTESIS;  Surgeon: Brenna Adine CROME, DO;  Location: [facility] ENDOSCOPY;  Service: Cardiopulmonary;;  L pleural effusion  . TONSILLECTOMY    . VIDEO BRONCHOSCOPY Left 10/30/2022   Procedure: VIDEO BRONCHOSCOPY WITHOUT FLUORO;  Surgeon: Brenna Adine CROME, DO;  Location: [facility] ENDOSCOPY;  Service: Cardiopulmonary;  Laterality: Left;  SABRA VIDEO BRONCHOSCOPY WITH ENDOBRONCHIAL ULTRASOUND Bilateral 10/30/2022   Procedure: VIDEO BRONCHOSCOPY WITH ENDOBRONCHIAL ULTRASOUND;  Surgeon: Brenna Adine CROME, DO;  Location: [facility] ENDOSCOPY;  Service: Cardiopulmonary;  Laterality: Bilateral;     Medications:   Current Facility-Administered Medications:  .  atorvastatin  (LIPITOR) tablet 20 mg, 20 mg, Oral, Daily, Desai, Rahul P, PA-C .  Chlorhexidine  Gluconate Cloth 2 % PADS 6 each, 6 each, Topical,  Daily, Claudene Toribio BROCKS, MD, 6 each at 02/28/23 1053 .  ferumoxytol  (FERAHEME) 510 mg in sodium chloride  0.9 % 100 mL IVPB, 510 mg, Intravenous, Weekly, Macel Jayson PARAS, MD, Stopped at 02/26/23 2038 .  levETIRAcetam  (KEPPRA ) IVPB 500 mg/100 mL premix, 500 mg, Intravenous, Q12H, [name], John D, PA-C, Stopped at 02/28/23 [phone] .  ondansetron  (ZOFRAN ) injection 4 mg, 4 mg, Intravenous, Q8H PRN, Antonetta Moccasin B, NP, 4 mg at 02/27/23 1759 .  Oral care  mouth rinse, 15 mL, Mouth Rinse, PRN, Gretta Doffing P, DO .  pantoprazole  (PROTONIX ) injection 40 mg, 40 mg, Intravenous, Q12H, Simpson, Paula B, NP, 40 mg at 02/28/23 1052 .  sodium chloride  flush (NS) 0.9 % injection 3 mL, 3 mL, Intravenous, Q12H, Anner Alm ORN, MD, 3 mL at 02/28/23 1053 .  sodium chloride  flush (NS) 0.9 % injection 3 mL, 3 mL, Intravenous, PRN, Anner Alm ORN, MD, 3 mL at 02/26/23 2033  Allergies: Allergies  Allergen Reactions  . Codeine Other (See Comments)    Unknown reaction, severe headach  . Metformin  And Related Diarrhea    DOROTHE CHRISTELLA GRIFFINS, MD  Total Time Spent in Direct Patient Care:  I personally spent 45 minutes on the unit in direct patient care. The direct patient care time included face-to-face time with the patient, reviewing the patient's chart, communicating with patient's family, other professionals, and coordinating care. Greater than 50% of this time was spent in counseling or coordinating care with the patient regarding goals of hospitalization, psycho-education, and discharge planning needs.

## 2023-02-28 NOTE — Consult Note (Addendum)
 Consultation  Referring Provider:  [facility]  Primary Care Physician:  Duanne Butler DASEN, MD Primary Gastroenterologist:  Dr. Teressa       Reason for Consultation:    Coffee-ground emesis  LOS: 2 days          HPI:   Brian [name] is a 68 y.o. male with past medical history significant for A-fib on Eliquis  (systolic and diastolic heart failure, hypertension, hyperlipidemia, CKD 3B, seizure disorder, CVA with residual left hemiparesis, SCC of tongue, presenting for evaluation of coffee-ground emesis.  Patient presenting from [facility] by EMS after being found altered.  Patient was bradycardic and hypotensive and there was emesis nearby with half dissolved pill (large black collapsible).  Upon admission he was found to be bradycardic with rate in the 40s.  Systolic blood pressure in the 70s.  There was concern for CCB/BB overdose.  Chest x-ray showed cardiomegaly but no obvious infiltrate.  Patient was consulted by [facility] for admission and cardiology.  In ED patient was placed on epi gtt., oxygen 2L [employer], BUN 52, CR 2.84, troponin is normal.  Patient states last dose of his Eliquis  was 1/7.  He states he took a black pill that contained THC.  He states soon after he began having vomiting.  States he has never taken this black pill before.  Denies previous episodes of coffee-ground emesis.  Denies recurrent episodes of emesis since being admitted.  Denies abdominal pain, nausea, vomiting.  Denies heartburn.  Denies melena.  Denies change in bowel habits.  Patient denies previous EGD.  Currently has NG tube with 150 cc black material in canister that tested positive on Hemoccult.  Patient is adamant about refusing EGD.  Patient is oriented and states he insists he is not having a GI bleed and refuses endoscopic evaluation at this time.  Notable labs -Hgb 9.8 (down from 11.0 on admission).  Baseline appears 8-10. - BUN 43, CR 2.04, GFR 35 -- Hemodynamically stable  PREVIOUS GI WORKUP    Colonoscopy 07/2018 for history of polyps - One 3 mm polyp (hyperplastic) in the transverse colon, removed with a cold snare. Resected and retrieved.  - External and internal hemorrhoids.  - The examination was otherwise normal on direct and retroflexion views. - repeat 5 years  Colonoscopy 04/2015 - One 15 mm polyp (tubular adenoma) in the mid ascending colon, removed with a hot snare. Resected and retrieved.  - One 6 mm polyp (tubular adenoma) in the sigmoid colon, removed with a cold snare. Resected and retrieved. - The examination was otherwise normal.  Past Medical History:  Diagnosis Date  . Arthritis   . Atrial fibrillation (HCC)   . Cancer (HCC) 2021   SCC base of tongue  . Cardiomyopathy   . Cerebrovascular accident [facility]) 2012   R MCA  . Diabetes mellitus   . Hyperlipidemia   . Hypertension   . Left-sided weakness   . Lung cancer, lower lobe (HCC) 05/2021  . 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 Alvia, laser therapy OD  . Seizures (HCC)    pt reports this is from low blood sugar    Surgical History:  He  has a past surgical history that includes Ear surgery as a child; Tonsillectomy; Direct laryngoscopy (N/A, 02/10/2020); IR IMAGING GUIDED PORT INSERTION (03/09/2020); IR GASTROSTOMY TUBE MOD SED (03/23/2020); IR GASTROSTOMY TUBE REMOVAL/REPAIR (06/01/2020); Colonoscopy; IR REMOVAL TUN ACCESS W/ PORT W/O FL MOD SED (02/08/2021); Video bronchoscopy (Left,  10/30/2022); Video bronchoscopy with endobronchial ultrasound (Bilateral, 10/30/2022); Thoracentesis (10/30/2022); Bronchial needle aspiration biopsy (10/30/2022); and TEMPORARY PACEMAKER (N/A, 02/26/2023). Family History:  His family history includes Breast cancer in his sister; Colon cancer in his mother; Hyperlipidemia in his father; Hypertension in his father. Social History:   reports that he has never smoked. He has never been exposed to tobacco smoke. He has never used smokeless tobacco. He reports current  alcohol  use. He reports that he does not currently use drugs after having used the following drugs: Marijuana. Frequency: 7.00 times per week.  Prior to Admission medications   Medication Sig Start Date End Date Taking? Authorizing Provider  apixaban  (ELIQUIS ) 5 MG TABS tablet Take 1 tablet (5 mg total) by mouth 2 (two) times daily. 10/16/22  Yes Duanne Butler DASEN, MD  atorvastatin  (LIPITOR) 20 MG tablet Take 1 tablet (20 mg total) by mouth daily. Patient taking differently: Take 20 mg by mouth at bedtime. 05/28/22  Yes Duanne Butler DASEN, MD  diltiazem  (CARDIZEM  CD) 360 MG 24 hr capsule Take 1 capsule (360 mg total) by mouth daily. 01/10/23  Yes Ghimire, Donalda HERO, MD  empagliflozin  (JARDIANCE ) 25 MG TABS tablet Take 1 tablet (25 mg total) by mouth daily before breakfast. 10/19/22  Yes Duanne Butler DASEN, MD  hydrALAZINE  (APRESOLINE ) 50 MG tablet Take 1 tablet (50 mg total) by mouth 3 (three) times daily. 01/01/23  Yes Duanne Butler DASEN, MD  insulin  aspart (NOVOLOG  FLEXPEN) 100 UNIT/ML FlexPen 0-9 Units, Subcutaneous, 3 times daily with meals CBG < 70: Implement Hypoglycemia measures CBG 70 - 120: 0 units CBG 121 - 150: 1 unit CBG 151 - 200: 2 units CBG 201 - 250: 3 units CBG 251 - 300: 5 units CBG 301 - 350: 7 units CBG 351 - 400: 9 units CBG > 400: call MD 01/10/23  Yes Ghimire, Donalda HERO, MD  levETIRAcetam  (KEPPRA ) 500 MG tablet Take 1 tablet (500 mg total) by mouth 2 (two) times daily. 01/10/23  Yes Ghimire, Donalda HERO, MD  losartan  (COZAAR ) 100 MG tablet Take 0.5 tablets (50 mg total) by mouth daily. 01/10/23  Yes Ghimire, Donalda HERO, MD  Magnesium  200 MG TABS Take 400 mg by mouth daily.   Yes [provider]  metoprolol  succinate (TOPROL -XL) 200 MG 24 hr tablet Take 1 tablet (200 mg total) by mouth daily. Take with or immediately following a meal. 01/10/23  Yes Ghimire, Donalda HERO, MD  polyethylene glycol (MIRALAX  / GLYCOLAX ) 17 g packet Take 17 g by mouth daily as needed for mild constipation  or moderate constipation.   Yes [provider]  Continuous Blood Gluc Sensor (FREESTYLE LIBRE 2 SENSOR) MISC Use as directed to monitor blood glucose continuously. Change sensor Q 14 days. DX E11.65 04/11/20   Duanne Butler DASEN, MD  glucose blood test strip 1 each by Other route as needed for other. Use as instructed 03/10/20   Duanne Butler DASEN, MD    Current Facility-Administered Medications  Medication Dose Route Frequency Provider Last Rate Last Admin  . atorvastatin  (LIPITOR) tablet 20 mg  20 mg Oral Daily Desai, Rahul P, PA-C      . Chlorhexidine  Gluconate Cloth 2 % PADS 6 each  6 each Topical Daily Claudene Toribio BROCKS, MD   6 each at 02/28/23 1053  . ferumoxytol  (FERAHEME) 510 mg in sodium chloride  0.9 % 100 mL IVPB  510 mg Intravenous Weekly Macel Jayson PARAS, MD   Stopped at 02/26/23 2038  . levETIRAcetam  (KEPPRA ) IVPB 500  mg/100 mL premix  500 mg Intravenous Q12H [name], John D, PA-C   Stopped at 02/28/23 9187  . ondansetron  (ZOFRAN ) injection 4 mg  4 mg Intravenous Q8H PRN Simpson, Paula B, NP   4 mg at 02/27/23 1759  . Oral care mouth rinse  15 mL Mouth Rinse PRN Gretta Doffing P, DO      . pantoprazole  (PROTONIX ) injection 40 mg  40 mg Intravenous Q12H Simpson, Paula B, NP   40 mg at 02/28/23 1052  . sodium chloride  flush (NS) 0.9 % injection 3 mL  3 mL Intravenous Q12H Anner Alm ORN, MD   3 mL at 02/28/23 1053  . sodium chloride  flush (NS) 0.9 % injection 3 mL  3 mL Intravenous PRN Anner Alm ORN, MD   3 mL at 02/26/23 2033    Allergies as of 02/26/2023 - Review Complete 02/26/2023  Allergen Reaction Noted  . Codeine Other (See Comments)   . Metformin  and related Diarrhea 01/31/2021    Review of Systems  Constitutional:  Negative for chills, fever and weight loss.  HENT:  Negative for hearing loss and tinnitus.   Eyes:  Negative for blurred vision and double vision.  Respiratory:  Negative for cough and hemoptysis.   Cardiovascular:  Negative for chest pain and  palpitations.  Gastrointestinal:  Positive for nausea and vomiting. Negative for abdominal pain, blood in stool, constipation, diarrhea, heartburn and melena.  Genitourinary:  Negative for dysuria and urgency.  Musculoskeletal:  Negative for myalgias and neck pain.  Skin:  Negative for itching and rash.  Neurological:  Negative for seizures and loss of consciousness.  Psychiatric/Behavioral:  Negative for depression and suicidal ideas.        Physical Exam:  Vital signs in last 24 hours: Temp:  [98.8 F (37.1 C)-99.3 F (37.4 C)] 98.9 F (37.2 C) (01/09 0820) Pulse Rate:  [83-107] 104 (01/09 1100) Resp:  [8-34] 17 (01/09 1100) BP: (96-161)/(54-112) 124/88 (01/09 1100) SpO2:  [87 %-100 %] 98 % (01/09 1100) Weight:  [68.6 kg] 68.6 kg (01/09 0500) Last BM Date :  (PTA) Last BM recorded by nurses in past 5 days No data recorded  Physical Exam Constitutional:      Appearance: He is ill-appearing.  HENT:     Head: Normocephalic and atraumatic.     Nose: Nose normal.     Comments: NG tube with 150 cc dark material in canister    Mouth/Throat:     Mouth: Mucous membranes are moist.     Pharynx: Oropharynx is clear.  Eyes:     General: No scleral icterus.    Extraocular Movements: Extraocular movements intact.  Cardiovascular:     Rate and Rhythm: Normal rate and regular rhythm.  Pulmonary:     Effort: Pulmonary effort is normal. No respiratory distress.  Abdominal:     General: Bowel sounds are normal. There is no distension.     Palpations: Abdomen is soft. There is no mass.     Tenderness: There is no abdominal tenderness. There is no guarding or rebound.     Hernia: No hernia is present.  Musculoskeletal:        General: No swelling.     Cervical back: Normal range of motion and neck supple.  Skin:    General: Skin is warm and dry.     Coloration: Skin is pale.  Neurological:     General: No focal deficit present.     Mental Status: He is alert and oriented  to person,  place, and time.  Psychiatric:        Mood and Affect: Mood normal.        Behavior: Behavior normal.        Thought Content: Thought content normal.        Judgment: Judgment normal.      LAB RESULTS: Recent Labs    02/26/23 1843 02/27/23 0252 02/27/23 1929 02/28/23 0004 02/28/23 0639 02/28/23 0642  WBC 5.4 3.6*  --   --  3.6*  --   HGB 10.0* 9.4*   < > 10.3* 9.8* 9.8*  HCT 31.9* 29.6*   < > 31.5* 30.5* 29.8*  PLT 140* 149*  --   --  150  --    < > = values in this interval not displayed.   BMET Recent Labs    02/26/23 2326 02/27/23 0252 02/28/23 0639  NA 135 137 138  K 5.9* 4.9 4.3  CL 107 107 102  CO2 22 21* 27  GLUCOSE 118* 71 104*  BUN 45* 48* 43*  CREATININE 2.43* 2.60* 2.04*  CALCIUM  9.0 9.2 8.6*   LFT Recent Labs    02/26/23 1551  PROT 5.3*  ALBUMIN 2.9*  AST 13*  ALT 18  ALKPHOS 60  BILITOT 0.6   PT/INR No results for input(s): LABPROT, INR in the last 72 hours.  STUDIES: DG Abd 1 View Result Date: 02/27/2023 CLINICAL DATA:  Check gastric catheter placement EXAM: ABDOMEN - 1 VIEW COMPARISON:  None Available. FINDINGS: Gastric catheter extends into the stomach which is well distended with air. The proximal side port lies in the distal esophagus. This should be advanced several cm deeper into the stomach. Small left pleural effusion is noted. IMPRESSION: Gastric catheter as described. This should be advanced into the stomach. Electronically Signed   By: Oneil Devonshire M.D.   On: 02/27/2023 23:40   DG Chest Port 1 View Result Date: 02/27/2023 CLINICAL DATA:  Emesis EXAM: PORTABLE CHEST 1 VIEW COMPARISON:  02/26/2023 FINDINGS: Cardiac shadow is stable. Aortic calcifications are noted. New patchy airspace opacity is noted in the right mid lung with stable airspace opacity in the left mid lung. Mild vascular congestion remains. Small effusion is noted on the left. IMPRESSION: Increasing right-sided airspace opacity. The remainder of the exam is stable.  Electronically Signed   By: Oneil Devonshire M.D.   On: 02/27/2023 22:30   ECHOCARDIOGRAM COMPLETE Result Date: 02/27/2023    ECHOCARDIOGRAM REPORT   Patient Name:   Brian [name] Date of Exam: 02/27/2023 Medical Rec #:  [id number]         Height:       69.0 in Accession #:    [id number]        Weight:       167.5 lb Date of Birth:  24-Jun-1955          BSA:          1.916 m Patient Age:    67 years          BP:           134/82 mmHg Patient Gender: M                 HR:           90 bpm. Exam Location:  Inpatient Procedure: 2D Echo, Cardiac Doppler, Color Doppler and Intracardiac            Opacification Agent Indications:    Heart Block, Complete  History:        Patient has prior history of Echocardiogram examinations, most                 recent 01/07/2023. Arrythmias:Atrial Fibrillation; Risk                 Factors:Hypertension, Diabetes and Dyslipidemia.  Sonographer:    Ozell Free Referring Phys: 26 Pratt W HARDING  Sonographer Comments: Technically challenging study due to limited acoustic windows. IMPRESSIONS  1. Cannot fully assess wall motion. Left ventricular ejection fraction, by estimation, is 60 to 65%. The left ventricle has normal function. There is moderate concentric left ventricular hypertrophy. Left ventricular diastolic parameters are indeterminate.  2. Right ventricular systolic function is mildly reduced. The right ventricular size is mildly enlarged. Tricuspid regurgitation signal is inadequate for assessing PA pressure.  3. Left atrial size was moderately dilated.  4. Right atrial size was mildly dilated.  5. The mitral valve was not well visualized. No evidence of mitral valve regurgitation.  6. The aortic valve was not well visualized. Aortic valve regurgitation is not visualized.  7. The inferior vena cava is dilated in size with >50% respiratory variability, suggesting right atrial pressure of 8 mmHg. FINDINGS  Left Ventricle: Cannot fully assess wall motion. Left ventricular ejection  fraction, by estimation, is 60 to 65%. The left ventricle has normal function. Definity  contrast agent was given IV to delineate the left ventricular endocardial borders. The left ventricular internal cavity size was normal in size. There is moderate concentric left ventricular hypertrophy. Left ventricular diastolic parameters are indeterminate. Right Ventricle: The right ventricular size is mildly enlarged. Right ventricular systolic function is mildly reduced. Tricuspid regurgitation signal is inadequate for assessing PA pressure. Left Atrium: Left atrial size was moderately dilated. Right Atrium: Right atrial size was mildly dilated. Pericardium: There is no evidence of pericardial effusion. Mitral Valve: The mitral valve was not well visualized. No evidence of mitral valve regurgitation. Tricuspid Valve: Tricuspid valve regurgitation is not demonstrated. Aortic Valve: The aortic valve was not well visualized. Aortic valve regurgitation is not visualized. Aortic valve mean gradient measures 1.7 mmHg. Aortic valve peak gradient measures 3.1 mmHg. Aortic valve area, by VTI measures 2.82 cm. Pulmonic Valve: Pulmonic valve regurgitation is not visualized. Aorta: The aortic root and ascending aorta are structurally normal, with no evidence of dilitation. Venous: The inferior vena cava is dilated in size with greater than 50% respiratory variability, suggesting right atrial pressure of 8 mmHg. IAS/Shunts: The interatrial septum was not well visualized.  LEFT VENTRICLE PLAX 2D LVIDd:         4.00 cm   Diastology LVIDs:         2.40 cm   LV e' medial:    4.93 cm/s LV PW:         1.30 cm   LV E/e' medial:  17.6 LV IVS:        1.30 cm   LV e' lateral:   7.29 cm/s LVOT diam:     2.00 cm   LV E/e' lateral: 11.9 LV SV:         43 LV SV Index:   22 LVOT Area:     3.14 cm  RIGHT VENTRICLE            IVC RV Basal diam:  4.30 cm    IVC diam: 2.60 cm RV S prime:     6.56 cm/s TAPSE (M-mode): 1.6 cm LEFT ATRIUM  Index        RIGHT ATRIUM           Index LA diam:        4.30 cm  2.24 cm/m   RA Area:     24.10 cm LA Vol (A2C):   68.1 ml  35.54 ml/m  RA Volume:   76.00 ml  39.66 ml/m LA Vol (A4C):   105.0 ml 54.79 ml/m LA Biplane Vol: 86.1 ml  44.93 ml/m  AORTIC VALVE AV Area (Vmax):    2.38 cm AV Area (Vmean):   2.40 cm AV Area (VTI):     2.82 cm AV Vmax:           88.00 cm/s AV Vmean:          60.667 cm/s AV VTI:            0.153 m AV Peak Grad:      3.1 mmHg AV Mean Grad:      1.7 mmHg LVOT Vmax:         66.63 cm/s LVOT Vmean:        46.433 cm/s LVOT VTI:          0.137 m LVOT/AV VTI ratio: 0.90  AORTA Ao Root diam: 3.40 cm Ao Asc diam:  3.30 cm MITRAL VALVE MV Area (PHT): 3.92 cm    SHUNTS MV Decel Time: 194 msec    Systemic VTI:  0.14 m MV E velocity: 86.80 cm/s  Systemic Diam: 2.00 cm Ronal Ross Electronically signed by Ronal Ross Signature Date/Time: 02/27/2023/12:35:35 PM    Final    DG Chest Port 1 View Result Date: 02/26/2023 CLINICAL DATA:  Complete heart block EXAM: PORTABLE CHEST 1 VIEW COMPARISON:  Chest x-ray 02/26/2023 FINDINGS: The cardiomediastinal silhouette is stable, the heart is enlarged. There central pulmonary vascular congestion. Small left pleural effusion persists. No pneumothorax or acute fracture. IMPRESSION: 1. Cardiomegaly with central pulmonary vascular congestion. 2. Small left pleural effusion. Electronically Signed   By: Greig Pique M.D.   On: 02/26/2023 18:58   CARDIAC CATHETERIZATION Result Date: 02/26/2023 .  Anticipated discharge date to be determined. .  No indication for antiplatelet therapy at this time . Successful Temporary Pacemaker Placement via RFV 6 Fr Sheath -> 62 cm, Rate 80 bpm, 5 mA (threshold 2 mA) The patient will be transported to the CVICU.  Will use right knee immobilizer as well as bilateral wrist and right ankle restraints to avoid moving the pacemaker. Chest x-ray ordered to evaluate line positioning. Alm Clay, MD  CT HEAD WO CONTRAST  ( ) Result Date: 02/26/2023 CLINICAL DATA:  Mental status change, unknown cause EXAM: CT HEAD WITHOUT CONTRAST TECHNIQUE: Contiguous axial images were obtained from the base of the skull through the vertex without intravenous contrast. RADIATION DOSE REDUCTION: This exam was performed according to the departmental dose-optimization program which includes automated exposure control, adjustment of the mA and/or kV according to patient size and/or use of iterative reconstruction technique. COMPARISON:  CT head and MRI head January 07, 2023. FINDINGS: Brain: Similar remote bilateral parieto-occipital infarcts. Remote left basal ganglia infarct. Advanced patchy white matter hypodensities, compatible with chronic microvascular ischemic disease. Vascular: Calcific atherosclerosis. No hyperdense vessel identified. Skull: No acute fracture. Sinuses/Orbits: Clear sinuses.  No acute orbital findings. Other: Right mastoid effusion, similar. IMPRESSION: 1. No evidence of acute intracranial abnormality. MRI could provide more sensitive evaluation for acute infarct if clinically warranted. 2. Remote infarcts and advanced chronic microvascular ischemic change. 3. Chronic right  mastoid effusion. Electronically Signed   By: Gilmore GORMAN Molt M.D.   On: 02/26/2023 17:29   DG Chest Portable 1 View Result Date: 02/26/2023 CLINICAL DATA:  Bradycardia, hypotension. EXAM: PORTABLE CHEST 1 VIEW COMPARISON:  Chest x-ray 01/07/2023 FINDINGS: Heart is enlarged, unchanged. Evaluation of the left lower lung is obscured by overlying external artifact. There is likely a small left pleural effusion. Right lung is clear. No pneumothorax visualized. No acute osseous abnormality. IMPRESSION: 1. Evaluation of the left lower lung is obscured by overlying external artifact. There is likely a small left pleural effusion. 2. Cardiomegaly. Electronically Signed   By: Greig Pique M.D.   On: 02/26/2023 16:16      Impression    Coffee-ground  emesis Coffee-ground emesis after ingesting black pill containing THC.  Occult positive.   Hgb 9.8 (down from 11.0).  Baseline appears 8-10 BUN 43, CR 2.04, GFR 35 Patient adamantly declines EGD.  Patient is alert and oriented.  Patient is informed and aware that if he decompensates and has further bleeding then this procedure can become emergent.  He is aware that there is concern for upper GI bleed requiring endoscopic evaluation but continues to decline EGD.  Symptomatic bradycardia A-fib on Eliquis  S/p TVP placement 02/26/2023 no removed Last dose Eliquis  1/7  HFrEF EF 45 to 50%    Plan   - PPI 40 Mg IV twice daily - Continue daily CBC and transfuse as needed to maintain HGB > 8 - Patient adamantly declines EGD at this time.  If patient changes his mind or worsening hemoglobin/further bleeding please call us  back.  GI will likely sign off for now secondary to patient declining endoscopic intervention  Thank you for your kind consultation   Nestor CHRISTELLA Blower  02/28/2023, 11:57 AM    Attending physician's note  I have taken a history, reviewed the chart and examined the patient. I performed a substantive portion of this encounter, including complete performance of at least one of the key components, in conjunction with the APP. I agree with the APP's note, impression and recommendations.    68 year old male with A-fib on Eliquis , CKD, history of CVA with left hemiparesis admitted with symptomatic bradycardia and hypotension. He has been having multiple episodes of coffee-ground emesis, NG tube placed with dark fluid. Discussed possible EGD to further evaluate nausea, vomiting and coffee-ground emesis.  Will need to exclude gastroduodenal ulcer with possible gastric outlet obstruction. Patient refuses to undergo any procedure or any treatment.  He was not even interested in having a conversation.  Daughter arrived while I was discussing the plan, I started discussing plan with her, mid  conversation she stepped out with a phone call. Per psych eval patient has capacity Please reconsult GI if patient changes his mind    K. Veena Cristina Mattern , MD [phone]

## 2023-02-28 NOTE — Progress Notes (Signed)
 SLP Cancellation Note  Patient Details Name: Brian [name] MRN: [id number] DOB: January 16, 1956   Cancelled treatment:       Reason Eval/Treat Not Completed: Medical issues which prohibited therapy. Pt scheduled to complete an MBS this date. He has had multiple episodes of coffee ground emesis with ongoing w/u and NG placement. Will hold on further evaluation of swallowing until this resolves. SLP will continue following.    Damien Blumenthal, M.A., CF-SLP Speech Language Pathology, Acute Rehabilitation Services  Secure Chat preferred [phone]  02/28/2023, 9:17 AM

## 2023-02-28 NOTE — Progress Notes (Signed)
 Progress Note  Patient Name: Brian [name] Date of Encounter: 02/28/2023 Primary Cardiologist: Lynwood Schilling, MD   Subjective   Overnight TPV was removed.   Multipe episode of NV and coffee ground emesis. No decrease in WMA. Patient notes the he feels great.  No CP, SOB, Palpitations.Feels back to baseline  Vital Signs    Vitals:   02/28/23 0500 02/28/23 0600 02/28/23 0700 02/28/23 0820  BP: 126/85 (!) 133/94 111/72   Pulse: (!) 105 99 97   Resp: 19 17 18    Temp:    98.9 F (37.2 C)  TempSrc:    Axillary  SpO2: 100% 98% 97%   Weight: 68.6 kg     Height: 5' 9 (1.753 m)       Intake/Output Summary (Last 24 hours) at 02/28/2023 0841 Last data filed at 02/28/2023 0622 Gross per 24 hour  Intake 1400.66 ml  Output 3725 ml  Net -2324.34 ml   Filed Weights   02/26/23 1714 02/27/23 0500 02/28/23 0500  Weight: 72.6 kg 76 kg 68.6 kg    Physical Exam   GEN: No acute distress.  Chronically ill Neck: No JVD Cardiac: IRIR no murmurs, rubs, or gallops.  Respiratory: Decreased breath sounds. GI: Soft, nontender, non-distended  MS: No edema  Labs   EKG: Not repeated  Telemetry: AF rates ~ 96   Chemistry Recent Labs  Lab 02/26/23 1551 02/26/23 1843 02/26/23 2326 02/27/23 0252 02/28/23 0639  NA 134*  --  135 137 138  K 5.6*  --  5.9* 4.9 4.3  CL 105  --  107 107 102  CO2 20*  --  22 21* 27  GLUCOSE 114*  --  118* 71 104*  BUN 52*  --  45* 48* 43*  CREATININE 2.84*   < > 2.43* 2.60* 2.04*  CALCIUM  8.3*  --  9.0 9.2 8.6*  PROT 5.3*  --   --   --   --   ALBUMIN 2.9*  --   --   --   --   AST 13*  --   --   --   --   ALT 18  --   --   --   --   ALKPHOS 60  --   --   --   --   BILITOT 0.6  --   --   --   --   GFRNONAA 23*   < > 28* 26* 35*  ANIONGAP 9  --  6 9 9    < > = values in this interval not displayed.     Hematology Recent Labs  Lab 02/26/23 1843 02/27/23 0252 02/27/23 1929 02/28/23 0004 02/28/23 0639 02/28/23 0642  WBC 5.4 3.6*  --   --   3.6*  --   RBC 3.51* 3.30*  --   --  3.43*  --   HGB 10.0* 9.4*   < > 10.3* 9.8* 9.8*  HCT 31.9* 29.6*   < > 31.5* 30.5* 29.8*  MCV 90.9 89.7  --   --  88.9  --   MCH 28.5 28.5  --   --  28.6  --   MCHC 31.3 31.8  --   --  32.1  --   RDW 16.9* 16.5*  --   --  16.3*  --   PLT 140* 149*  --   --  150  --    < > = values in this interval not displayed.    Cardiac EnzymesNo  results for input(s): TROPONINI in the last 168 hours. No results for input(s): TROPIPOC in the last 168 hours.   BNP Recent Labs  Lab 02/26/23 1853  BNP 303.0*     DDimer  Recent Labs  Lab 02/26/23 1551  DDIMER 0.86*     Cardiac Studies   Cardiac Studies & Procedures   CARDIAC CATHETERIZATION  CARDIAC CATHETERIZATION 02/26/2023  Narrative   Anticipated discharge date to be determined.   No indication for antiplatelet therapy at this time .  Successful Temporary Pacemaker Placement via RFV 6 Fr Sheath -> 62 cm, Rate 80 bpm, 5 mA (threshold 2 mA)  The patient will be transported to the CVICU.  Will use right knee immobilizer as well as bilateral wrist and right ankle restraints to avoid moving the pacemaker. Chest x-ray ordered to evaluate line positioning.   Alm Clay, MD    ECHOCARDIOGRAM  ECHOCARDIOGRAM COMPLETE 02/27/2023  Narrative ECHOCARDIOGRAM REPORT    Patient Name:   Brian [name] Date of Exam: 02/27/2023 Medical Rec #:  [id number]         Height:       69.0 in Accession #:    [id number]        Weight:       167.5 lb Date of Birth:  1955-03-31          BSA:          1.916 m Patient Age:    67 years          BP:           134/82 mmHg Patient Gender: M                 HR:           90 bpm. Exam Location:  Inpatient  Procedure: 2D Echo, Cardiac Doppler, Color Doppler and Intracardiac Opacification Agent  Indications:    Heart Block, Complete  History:        Patient has prior history of Echocardiogram examinations, most recent 01/07/2023. Arrythmias:Atrial Fibrillation;  Risk Factors:Hypertension, Diabetes and Dyslipidemia.  Sonographer:    Ozell Free Referring Phys: 64 Jonmarc W HARDING   Sonographer Comments: Technically challenging study due to limited acoustic windows. IMPRESSIONS   1. Cannot fully assess wall motion. Left ventricular ejection fraction, by estimation, is 60 to 65%. The left ventricle has normal function. There is moderate concentric left ventricular hypertrophy. Left ventricular diastolic parameters are indeterminate. 2. Right ventricular systolic function is mildly reduced. The right ventricular size is mildly enlarged. Tricuspid regurgitation signal is inadequate for assessing PA pressure. 3. Left atrial size was moderately dilated. 4. Right atrial size was mildly dilated. 5. The mitral valve was not well visualized. No evidence of mitral valve regurgitation. 6. The aortic valve was not well visualized. Aortic valve regurgitation is not visualized. 7. The inferior vena cava is dilated in size with >50% respiratory variability, suggesting right atrial pressure of 8 mmHg.  FINDINGS Left Ventricle: Cannot fully assess wall motion. Left ventricular ejection fraction, by estimation, is 60 to 65%. The left ventricle has normal function. Definity  contrast agent was given IV to delineate the left ventricular endocardial borders. The left ventricular internal cavity size was normal in size. There is moderate concentric left ventricular hypertrophy. Left ventricular diastolic parameters are indeterminate.  Right Ventricle: The right ventricular size is mildly enlarged. Right ventricular systolic function is mildly reduced. Tricuspid regurgitation signal is inadequate for assessing PA pressure.  Left Atrium: Left atrial size was  moderately dilated.  Right Atrium: Right atrial size was mildly dilated.  Pericardium: There is no evidence of pericardial effusion.  Mitral Valve: The mitral valve was not well visualized. No evidence of mitral  valve regurgitation.  Tricuspid Valve: Tricuspid valve regurgitation is not demonstrated.  Aortic Valve: The aortic valve was not well visualized. Aortic valve regurgitation is not visualized. Aortic valve mean gradient measures 1.7 mmHg. Aortic valve peak gradient measures 3.1 mmHg. Aortic valve area, by VTI measures 2.82 cm.  Pulmonic Valve: Pulmonic valve regurgitation is not visualized.  Aorta: The aortic root and ascending aorta are structurally normal, with no evidence of dilitation.  Venous: The inferior vena cava is dilated in size with greater than 50% respiratory variability, suggesting right atrial pressure of 8 mmHg.  IAS/Shunts: The interatrial septum was not well visualized.   LEFT VENTRICLE PLAX 2D LVIDd:         4.00 cm   Diastology LVIDs:         2.40 cm   LV e' medial:    4.93 cm/s LV PW:         1.30 cm   LV E/e' medial:  17.6 LV IVS:        1.30 cm   LV e' lateral:   7.29 cm/s LVOT diam:     2.00 cm   LV E/e' lateral: 11.9 LV SV:         43 LV SV Index:   22 LVOT Area:     3.14 cm   RIGHT VENTRICLE            IVC RV Basal diam:  4.30 cm    IVC diam: 2.60 cm RV S prime:     6.56 cm/s TAPSE (M-mode): 1.6 cm  LEFT ATRIUM              Index        RIGHT ATRIUM           Index LA diam:        4.30 cm  2.24 cm/m   RA Area:     24.10 cm LA Vol (A2C):   68.1 ml  35.54 ml/m  RA Volume:   76.00 ml  39.66 ml/m LA Vol (A4C):   105.0 ml 54.79 ml/m LA Biplane Vol: 86.1 ml  44.93 ml/m AORTIC VALVE AV Area (Vmax):    2.38 cm AV Area (Vmean):   2.40 cm AV Area (VTI):     2.82 cm AV Vmax:           88.00 cm/s AV Vmean:          60.667 cm/s AV VTI:            0.153 m AV Peak Grad:      3.1 mmHg AV Mean Grad:      1.7 mmHg LVOT Vmax:         66.63 cm/s LVOT Vmean:        46.433 cm/s LVOT VTI:          0.137 m LVOT/AV VTI ratio: 0.90  AORTA Ao Root diam: 3.40 cm Ao Asc diam:  3.30 cm  MITRAL VALVE MV Area (PHT): 3.92 cm    SHUNTS MV Decel Time: 194  msec    Systemic VTI:  0.14 m MV E velocity: 86.80 cm/s  Systemic Diam: 2.00 cm  Ronal Ross Electronically signed by Ronal Ross Signature Date/Time: 02/27/2023/12:35:35 PM    Final   MONITORS  CARDIAC EVENT MONITOR  03/16/2021           Assessment & Plan   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    AKI  - improving; holding off GDMT for likely outpatient start - avoid hypotension   Possible suicidal ideation - per psych; has capactiy   Hyperkalemia - resolved off substances   AMS/hx stroke - improved  Will hold off restart of GDMT until outpatient We will plan on a conservative strategy for AV nodal agents.  Needs close follow up at DC given risk of tachycardia and the need to restart GDMT (ARB and potentially low dose BB; stop CCB should remain after outpatient f/u) Resumes AC when able   For questions or updates, please contact [facility] Please consult [email or web address] for contact info under Cardiology/STEMI.      Stanly Leavens, MD FASE [facility] Cardiologist [facility]  [address] [place], [address] [place], [place] [address] [phone]  8:41 AM

## 2023-03-01 ENCOUNTER — Other Ambulatory Visit: Payer: Self-pay

## 2023-03-01 ENCOUNTER — Inpatient Hospital Stay (HOSPITAL_COMMUNITY): Payer: Medicare HMO | Admitting: Anesthesiology

## 2023-03-01 ENCOUNTER — Encounter (HOSPITAL_COMMUNITY): Payer: Self-pay | Admitting: Internal Medicine

## 2023-03-01 ENCOUNTER — Encounter (HOSPITAL_COMMUNITY)
Admission: EM | Disposition: A | Discharge status: Skilled Nursing Facility | Payer: Self-pay | Source: Skilled Nursing Facility | Attending: Internal Medicine

## 2023-03-01 DIAGNOSIS — K2970 Gastritis, unspecified, without bleeding: Secondary | ICD-10-CM

## 2023-03-01 DIAGNOSIS — K449 Diaphragmatic hernia without obstruction or gangrene: Secondary | ICD-10-CM

## 2023-03-01 DIAGNOSIS — K567 Ileus, unspecified: Secondary | ICD-10-CM | POA: Diagnosis not present

## 2023-03-01 DIAGNOSIS — R112 Nausea with vomiting, unspecified: Secondary | ICD-10-CM

## 2023-03-01 DIAGNOSIS — E119 Type 2 diabetes mellitus without complications: Secondary | ICD-10-CM | POA: Diagnosis not present

## 2023-03-01 DIAGNOSIS — I442 Atrioventricular block, complete: Secondary | ICD-10-CM | POA: Diagnosis not present

## 2023-03-01 DIAGNOSIS — K2980 Duodenitis without bleeding: Secondary | ICD-10-CM | POA: Diagnosis not present

## 2023-03-01 DIAGNOSIS — K920 Hematemesis: Secondary | ICD-10-CM

## 2023-03-01 DIAGNOSIS — K3189 Other diseases of stomach and duodenum: Secondary | ICD-10-CM | POA: Diagnosis not present

## 2023-03-01 DIAGNOSIS — I10 Essential (primary) hypertension: Secondary | ICD-10-CM | POA: Diagnosis not present

## 2023-03-01 DIAGNOSIS — R001 Bradycardia, unspecified: Secondary | ICD-10-CM | POA: Diagnosis not present

## 2023-03-01 DIAGNOSIS — I5020 Unspecified systolic (congestive) heart failure: Secondary | ICD-10-CM | POA: Diagnosis not present

## 2023-03-01 HISTORY — PX: ESOPHAGOGASTRODUODENOSCOPY: SHX5428

## 2023-03-01 HISTORY — PX: BIOPSY: SHX5522

## 2023-03-01 HISTORY — DX: Nausea with vomiting, unspecified: R11.2

## 2023-03-01 LAB — BASIC METABOLIC PANEL
Anion gap: 14 (ref 5–15)
BUN: 39 mg/dL — ABNORMAL HIGH (ref 8–23)
CO2: 24 mmol/L (ref 22–32)
Calcium: 8.7 mg/dL — ABNORMAL LOW (ref 8.9–10.3)
Chloride: 103 mmol/L (ref 98–111)
Creatinine, Ser: 1.85 mg/dL — ABNORMAL HIGH (ref 0.61–1.24)
GFR, Estimated: 39 mL/min — ABNORMAL LOW (ref >60)
Glucose, Bld: 73 mg/dL (ref 70–99)
Potassium: 4.1 mmol/L (ref 3.5–5.1)
Sodium: 141 mmol/L (ref 135–145)

## 2023-03-01 LAB — OCCULT BLOOD GASTRIC / DUODENUM (SPECIMEN CUP): Occult Blood, Gastric: POSITIVE — AB

## 2023-03-01 LAB — CBC
HCT: 30.2 % — ABNORMAL LOW (ref 39.0–52.0)
Hemoglobin: 9.8 g/dL — ABNORMAL LOW (ref 13.0–17.0)
MCH: 29.2 pg (ref 26.0–34.0)
MCHC: 32.5 g/dL (ref 30.0–36.0)
MCV: 89.9 fL (ref 80.0–100.0)
Platelets: 177 10*3/uL (ref 150–400)
RBC: 3.36 MIL/uL — ABNORMAL LOW (ref 4.22–5.81)
RDW: 15.9 % — ABNORMAL HIGH (ref 11.5–15.5)
WBC: 3.7 10*3/uL — ABNORMAL LOW (ref 4.0–10.5)
nRBC: 0 % (ref 0.0–0.2)

## 2023-03-01 LAB — GLUCOSE, CAPILLARY
Glucose-Capillary: 78 mg/dL (ref 70–99)
Glucose-Capillary: 107 mg/dL — ABNORMAL HIGH (ref 70–99)
Glucose-Capillary: 98 mg/dL (ref 70–99)
Glucose-Capillary: 169 mg/dL — ABNORMAL HIGH (ref 70–99)
Glucose-Capillary: 251 mg/dL — ABNORMAL HIGH (ref 70–99)

## 2023-03-01 LAB — MAGNESIUM: Magnesium: 2.4 mg/dL (ref 1.7–2.4)

## 2023-03-01 LAB — PHOSPHORUS: Phosphorus: 3.9 mg/dL (ref 2.5–4.6)

## 2023-03-01 SURGERY — EGD (ESOPHAGOGASTRODUODENOSCOPY)
Anesthesia: Monitor Anesthesia Care

## 2023-03-01 MED ORDER — HYDRALAZINE HCL 20 MG/ML IJ SOLN: 5.0000 mg | INTRAMUSCULAR | Status: DC | PRN

## 2023-03-01 MED ORDER — CARVEDILOL 6.25 MG PO TABS
6.2500 mg | ORAL_TABLET | Freq: Two times a day (BID) | ORAL | Status: DC
  Administered 2023-03-01: 6.25 mg via ORAL
  Filled 2023-03-01 (×2): qty 1

## 2023-03-01 MED ORDER — BOOST / RESOURCE BREEZE PO LIQD CUSTOM
1.0000 | Freq: Three times a day (TID) | ORAL | Status: DC
  Administered 2023-03-01: 237 mL via ORAL
  Administered 2023-03-02 – 2023-03-07 (×15): 1 via ORAL

## 2023-03-01 MED ORDER — METOPROLOL TARTRATE 50 MG PO TABS: 50.0000 mg | ORAL_TABLET | Freq: Two times a day (BID) | ORAL | Status: DC

## 2023-03-01 MED ORDER — METOPROLOL TARTRATE 5 MG/5ML IV SOLN
2.5000 mg | Freq: Four times a day (QID) | INTRAVENOUS | Status: DC
  Administered 2023-03-01: 2.5 mg via INTRAVENOUS
  Filled 2023-03-01: qty 5

## 2023-03-01 MED ORDER — PROPOFOL 10 MG/ML IV BOLUS
INTRAVENOUS | Status: DC | PRN
  Administered 2023-03-01 (×2): 40 mg via INTRAVENOUS
  Administered 2023-03-01 (×2): 10 mg via INTRAVENOUS

## 2023-03-01 MED ORDER — ENOXAPARIN SODIUM 80 MG/0.8ML IJ SOSY
70.0000 mg | PREFILLED_SYRINGE | Freq: Two times a day (BID) | INTRAMUSCULAR | Status: DC
  Administered 2023-03-01 – 2023-03-03 (×4): 70 mg via SUBCUTANEOUS
  Filled 2023-03-01 (×5): qty 0.7

## 2023-03-01 MED ORDER — SODIUM CHLORIDE 0.9 % IV SOLN
INTRAVENOUS | Status: DC
Start: 2023-03-01 — End: 2023-03-01

## 2023-03-01 MED ORDER — METOCLOPRAMIDE HCL 5 MG/ML IJ SOLN
5.0000 mg | Freq: Three times a day (TID) | INTRAMUSCULAR | Status: AC
  Administered 2023-03-01 – 2023-03-02 (×3): 5 mg via INTRAVENOUS
  Filled 2023-03-01 (×3): qty 2

## 2023-03-01 MED ORDER — LIDOCAINE 2% (20 MG/ML) 5 ML SYRINGE
INTRAMUSCULAR | Status: DC | PRN
  Administered 2023-03-01: 80 mg via INTRAVENOUS

## 2023-03-01 MED ORDER — ATORVASTATIN CALCIUM 10 MG PO TABS: 20.0000 mg | ORAL_TABLET | Freq: Every day | ORAL | Status: DC

## 2023-03-01 NOTE — Progress Notes (Signed)
 Patient is agreeable to EGD. Currently NPO. No further output per NG when I was in the room and none documented in flowsheet per nursing. Hgb 9.8 yesterday 6am. Awaiting today's draw  Proceed with EGD this afternoon. Remain NPO. Please contact us  if something changes.

## 2023-03-01 NOTE — Progress Notes (Signed)
 Progress Note  Patient Name: Brian [name] Date of Encounter: 03/01/2023 Primary Cardiologist: Lynwood Schilling, MD   Subjective   Potential EGD today.  RVR s/p coming off all AV nodal therapy due to CHB.  Vital Signs    Vitals:   03/01/23 0500 03/01/23 0600 03/01/23 0700 03/01/23 0800  BP: (!) 164/110 (!) 150/98 126/78 (!) 157/106  Pulse: (!) 119 (!) 120 (!) 127 (!) 120  Resp: 16 18 17 17   Temp:      TempSrc:      SpO2: 99% 98% 100% 100%  Weight:      Height:        Intake/Output Summary (Last 24 hours) at 03/01/2023 0824 Last data filed at 03/01/2023 0600 Gross per 24 hour  Intake 33.94 ml  Output 1600 ml  Net -1566.06 ml   Filed Weights   02/26/23 1714 02/27/23 0500 02/28/23 0500  Weight: 72.6 kg 76 kg 68.6 kg    Physical Exam   GEN: No acute distress.  Chronically ill Neck: No JVD Cardiac: IRIR no murmurs, rubs, or gallops.  Respiratory: Decreased breath sounds. GI: Soft, nontender, non-distended  MS: No edema Psych: Conversant  Labs   EKG:  AF RVR with RBBB; Qtc 488; Jtc 338 ms Telemetry: AF rates ~ 120 bpm   Chemistry Recent Labs  Lab 02/26/23 1551 02/26/23 1843 02/26/23 2326 02/27/23 0252 02/28/23 0639  NA 134*  --  135 137 138  K 5.6*  --  5.9* 4.9 4.3  CL 105  --  107 107 102  CO2 20*  --  22 21* 27  GLUCOSE 114*  --  118* 71 104*  BUN 52*  --  45* 48* 43*  CREATININE 2.84*   < > 2.43* 2.60* 2.04*  CALCIUM  8.3*  --  9.0 9.2 8.6*  PROT 5.3*  --   --   --   --   ALBUMIN 2.9*  --   --   --   --   AST 13*  --   --   --   --   ALT 18  --   --   --   --   ALKPHOS 60  --   --   --   --   BILITOT 0.6  --   --   --   --   GFRNONAA 23*   < > 28* 26* 35*  ANIONGAP 9  --  6 9 9    < > = values in this interval not displayed.     Hematology Recent Labs  Lab 02/26/23 1843 02/27/23 0252 02/27/23 1929 02/28/23 0004 02/28/23 0639 02/28/23 0642  WBC 5.4 3.6*  --   --  3.6*  --   RBC 3.51* 3.30*  --   --  3.43*  --   HGB 10.0* 9.4*   <  > 10.3* 9.8* 9.8*  HCT 31.9* 29.6*   < > 31.5* 30.5* 29.8*  MCV 90.9 89.7  --   --  88.9  --   MCH 28.5 28.5  --   --  28.6  --   MCHC 31.3 31.8  --   --  32.1  --   RDW 16.9* 16.5*  --   --  16.3*  --   PLT 140* 149*  --   --  150  --    < > = values in this interval not displayed.    Cardiac EnzymesNo results for input(s): TROPONINI in the last 168 hours.  No results for input(s): TROPIPOC in the last 168 hours.   BNP Recent Labs  Lab 02/26/23 1853  BNP 303.0*     DDimer  Recent Labs  Lab 02/26/23 1551  DDIMER 0.86*     Cardiac Studies   Cardiac Studies & Procedures   CARDIAC CATHETERIZATION  CARDIAC CATHETERIZATION 02/26/2023  Narrative   Anticipated discharge date to be determined.   No indication for antiplatelet therapy at this time .  Successful Temporary Pacemaker Placement via RFV 6 Fr Sheath -> 62 cm, Rate 80 bpm, 5 mA (threshold 2 mA)  The patient will be transported to the CVICU.  Will use right knee immobilizer as well as bilateral wrist and right ankle restraints to avoid moving the pacemaker. Chest x-ray ordered to evaluate line positioning.   Brian Clay, MD    ECHOCARDIOGRAM  ECHOCARDIOGRAM COMPLETE 02/27/2023  Narrative ECHOCARDIOGRAM REPORT    Patient Name:   Brian [name] Date of Exam: 02/27/2023 Medical Rec #:  [id number]         Height:       69.0 in Accession #:    [id number]        Weight:       167.5 lb Date of Birth:  1955/05/20          BSA:          1.916 m Patient Age:    67 years          BP:           134/82 mmHg Patient Gender: M                 HR:           90 bpm. Exam Location:  Inpatient  Procedure: 2D Echo, Cardiac Doppler, Color Doppler and Intracardiac Opacification Agent  Indications:    Heart Block, Complete  History:        Patient has prior history of Echocardiogram examinations, most recent 01/07/2023. Arrythmias:Atrial Fibrillation; Risk Factors:Hypertension, Diabetes and Dyslipidemia.  Sonographer:     Ozell Free Referring Phys: 73 Keyston W HARDING   Sonographer Comments: Technically challenging study due to limited acoustic windows. IMPRESSIONS   1. Cannot fully assess wall motion. Left ventricular ejection fraction, by estimation, is 60 to 65%. The left ventricle has normal function. There is moderate concentric left ventricular hypertrophy. Left ventricular diastolic parameters are indeterminate. 2. Right ventricular systolic function is mildly reduced. The right ventricular size is mildly enlarged. Tricuspid regurgitation signal is inadequate for assessing PA pressure. 3. Left atrial size was moderately dilated. 4. Right atrial size was mildly dilated. 5. The mitral valve was not well visualized. No evidence of mitral valve regurgitation. 6. The aortic valve was not well visualized. Aortic valve regurgitation is not visualized. 7. The inferior vena cava is dilated in size with >50% respiratory variability, suggesting right atrial pressure of 8 mmHg.  FINDINGS Left Ventricle: Cannot fully assess wall motion. Left ventricular ejection fraction, by estimation, is 60 to 65%. The left ventricle has normal function. Definity  contrast agent was given IV to delineate the left ventricular endocardial borders. The left ventricular internal cavity size was normal in size. There is moderate concentric left ventricular hypertrophy. Left ventricular diastolic parameters are indeterminate.  Right Ventricle: The right ventricular size is mildly enlarged. Right ventricular systolic function is mildly reduced. Tricuspid regurgitation signal is inadequate for assessing PA pressure.  Left Atrium: Left atrial size was moderately dilated.  Right Atrium: Right atrial size was  mildly dilated.  Pericardium: There is no evidence of pericardial effusion.  Mitral Valve: The mitral valve was not well visualized. No evidence of mitral valve regurgitation.  Tricuspid Valve: Tricuspid valve regurgitation is  not demonstrated.  Aortic Valve: The aortic valve was not well visualized. Aortic valve regurgitation is not visualized. Aortic valve mean gradient measures 1.7 mmHg. Aortic valve peak gradient measures 3.1 mmHg. Aortic valve area, by VTI measures 2.82 cm.  Pulmonic Valve: Pulmonic valve regurgitation is not visualized.  Aorta: The aortic root and ascending aorta are structurally normal, with no evidence of dilitation.  Venous: The inferior vena cava is dilated in size with greater than 50% respiratory variability, suggesting right atrial pressure of 8 mmHg.  IAS/Shunts: The interatrial septum was not well visualized.   LEFT VENTRICLE PLAX 2D LVIDd:         4.00 cm   Diastology LVIDs:         2.40 cm   LV e' medial:    4.93 cm/s LV PW:         1.30 cm   LV E/e' medial:  17.6 LV IVS:        1.30 cm   LV e' lateral:   7.29 cm/s LVOT diam:     2.00 cm   LV E/e' lateral: 11.9 LV SV:         43 LV SV Index:   22 LVOT Area:     3.14 cm   RIGHT VENTRICLE            IVC RV Basal diam:  4.30 cm    IVC diam: 2.60 cm RV S prime:     6.56 cm/s TAPSE (M-mode): 1.6 cm  LEFT ATRIUM              Index        RIGHT ATRIUM           Index LA diam:        4.30 cm  2.24 cm/m   RA Area:     24.10 cm LA Vol (A2C):   68.1 ml  35.54 ml/m  RA Volume:   76.00 ml  39.66 ml/m LA Vol (A4C):   105.0 ml 54.79 ml/m LA Biplane Vol: 86.1 ml  44.93 ml/m AORTIC VALVE AV Area (Vmax):    2.38 cm AV Area (Vmean):   2.40 cm AV Area (VTI):     2.82 cm AV Vmax:           88.00 cm/s AV Vmean:          60.667 cm/s AV VTI:            0.153 m AV Peak Grad:      3.1 mmHg AV Mean Grad:      1.7 mmHg LVOT Vmax:         66.63 cm/s LVOT Vmean:        46.433 cm/s LVOT VTI:          0.137 m LVOT/AV VTI ratio: 0.90  AORTA Ao Root diam: 3.40 cm Ao Asc diam:  3.30 cm  MITRAL VALVE MV Area (PHT): 3.92 cm    SHUNTS MV Decel Time: 194 msec    Systemic VTI:  0.14 m MV E velocity: 86.80 cm/s  Systemic Diam:  2.00 cm  Brian [name] Electronically signed by Brian [name] Signature Date/Time: 02/27/2023/12:35:35 PM    Final   MONITORS  CARDIAC EVENT MONITOR 03/16/2021  Assessment & Plan   Complete Heart Block  - combination of AV nodal toxicity, K and unknown black pill - TPV removed 1/8  AFL - AC held for coffee ground emesis; Currently with RVR - QTC prolongation due to RBBB; Jtc 338; reasonable for REGLAN  trial if needed - EKG tomorrow - I have started low dose IV metoprolol  and Dc'ed PO till taking - AC when cleared for it   Chronic systolic heart failure>>recovered EF - LVEF recovered - unable to take PO     HTN Dysphagia - SBP stable (previously on hydral, losartan , diltiazem  and metoprolol ) - BP elevated due to lack of PO   AKI  - improving; holding off GDMT for now - avoid hypotension   Possible suicidal ideation - per psych; has capacity - He is much more present in care and had no SI, he is amenable to medication and interventions today   Hyperkalemia - resolved off substances   AMS/hx stroke - improved    For questions or updates, please contact [facility] Please consult [email or web address] for contact info under Cardiology/STEMI.      Brian Leavens, MD FASE [facility] Cardiologist [facility]  [address] [place], [address] [place], [place] [address] [phone]  8:24 AM

## 2023-03-01 NOTE — Anesthesia Postprocedure Evaluation (Signed)
 Anesthesia Post Note  Patient: Brian [name]  Procedure(s) Performed: ESOPHAGOGASTRODUODENOSCOPY (EGD) BIOPSY     Patient location during evaluation: Endoscopy Anesthesia Type: MAC Level of consciousness: awake and alert Pain management: pain level controlled Vital Signs Assessment: post-procedure vital signs reviewed and stable Respiratory status: spontaneous breathing, nonlabored ventilation and respiratory function stable Cardiovascular status: stable and blood pressure returned to baseline Postop Assessment: no apparent nausea or vomiting Anesthetic complications: no  No notable events documented.  Last Vitals:  Vitals:   03/01/23 1530 03/01/23 1540  BP: (!) 128/95 (!) 134/98  Pulse: (!) 126 (!) 116  Resp: (!) 22 (!) 23  Temp:    SpO2: 100% 97%    Last Pain:  Vitals:   03/01/23 1540  TempSrc:   PainSc: 0-No pain                 Janet Humphreys,W. EDMOND

## 2023-03-01 NOTE — Interval H&P Note (Signed)
 History and Physical Interval Note:  03/01/2023 2:05 PM  Brian [name]  has presented today for surgery, with the diagnosis of coffee ground emesis.  The various methods of treatment have been discussed with the patient and family. After consideration of risks, benefits and other options for treatment, the patient has consented to  Procedure(s): ESOPHAGOGASTRODUODENOSCOPY (EGD) (N/A) as a surgical intervention.  The patient's history has been reviewed, patient examined, no change in status, stable for surgery.  I have reviewed the patient's chart and labs.  Questions were answered to the patient's satisfaction.     Crissie Aloi

## 2023-03-01 NOTE — Transfer of Care (Signed)
 Immediate Anesthesia Transfer of Care Note  Patient: Brian [name]  Procedure(s) Performed: ESOPHAGOGASTRODUODENOSCOPY (EGD) BIOPSY  Patient Location: Endoscopy Unit  Anesthesia Type:MAC  Level of Consciousness: awake  Airway & Oxygen Therapy: Patient Spontanous Breathing and Patient connected to nasal cannula oxygen  Post-op Assessment: Report given to RN and Post -op Vital signs reviewed and stable  Post vital signs: Reviewed and stable  Last Vitals:  Vitals Value Taken Time  BP 128/95 03/01/23 1530  Temp    Pulse 126 03/01/23 1531  Resp 21 03/01/23 1531  SpO2 100 % 03/01/23 1531  Vitals shown include unfiled device data.  Last Pain:  Vitals:   03/01/23 1342  TempSrc: Temporal  PainSc: 0-No pain      Patients Stated Pain Goal: 0 (02/28/23 0400)  Complications: No notable events documented.

## 2023-03-01 NOTE — Op Note (Signed)
 [facility] Patient Name: Brian [name] Procedure Date : 03/01/2023 MRN: [id number] Attending MD: Gustav ALONSO Mcgee , MD, [id number] Date of Birth: 1955-03-23 CSN: [id number] Age: 68 Admit Type: Inpatient Procedure:                Upper GI endoscopy Indications:              Persistent vomiting of unknown cause, Coffee-ground                            emesis Providers:                Gustav ALONSO Mcgee, MD, Darleene Bare, RN, Lorrayne Kitty, Technician Referring MD:              Medicines:                Monitored Anesthesia Care Complications:            No immediate complications. Estimated Blood Loss:     Estimated blood loss was minimal. Procedure:                Pre-Anesthesia Assessment:                           - Prior to the procedure, a History and Physical                            was performed, and patient medications and                            allergies were reviewed. The patient's tolerance of                            previous anesthesia was also reviewed. The risks                            and benefits of the procedure and the sedation                            options and risks were discussed with the patient.                            All questions were answered, and informed consent                            was obtained. Prior Anticoagulants: The patient                            last took heparin  1 day prior to the procedure. ASA                            Grade Assessment: IV - A patient with severe  systemic disease that is a constant threat to life.                            After reviewing the risks and benefits, the patient                            was deemed in satisfactory condition to undergo the                            procedure.                           After obtaining informed consent, the endoscope was                            passed under direct vision.  Throughout the                            procedure, the patient's blood pressure, pulse, and                            oxygen saturations were monitored continuously. The                            [id number]-H190 ([id number] Olympus endoscope was introduced                            through the mouth, and advanced to the second part                            of duodenum. The upper GI endoscopy was                            accomplished without difficulty. The patient                            tolerated the procedure well. Scope In: Scope Out: Findings:      The Z-line was regular and was found 40 cm from the incisors.      No gross lesions were noted in the entire esophagus.      A 3 cm hiatal hernia was present.      Patchy mild inflammation characterized by erythema and linear erosions       was found in the entire examined stomach. Biopsies were taken with a       cold forceps for Helicobacter pylori testing.      The cardia and gastric fundus were normal on retroflexion. No gastric       outlet obstruction      Mild duodenitis otherwise no gross lesions were noted in the first       portion of the duodenum and in the second portion of the duodenum. Impression:               - Z-line regular, 40 cm from the incisors.                           -  No gross lesions in the entire esophagus.                           - 3 cm hiatal hernia.                           - Gastritis. Biopsied. No gastric outlet                            obstruction.                           - No gross lesions in the first portion of the                            duodenum and in the second portion of the duodenum. Recommendation:           - Patient has a contact number available for                            emergencies. The signs and symptoms of potential                            delayed complications were discussed with the                            patient. Return to normal activities tomorrow.                             Written discharge instructions were provided to the                            patient.                           - Resume previous diet. Advance as tolerated                           - Continue present medications.                           - Await pathology results.                           - Avoid NSAID's, THC or cannabis                           - GI will sign off, available if have any questions Procedure Code(s):        --- Professional ---                           [phone], Esophagogastroduodenoscopy, flexible,                            transoral; with biopsy, single or multiple Diagnosis Code(s):        --- Professional ---  K29.70, Gastritis, unspecified, without bleeding                           R11.15, Cyclical vomiting syndrome unrelated to                            migraine                           K92.0, Hematemesis CPT copyright 2022 [employer]. All rights reserved. The codes documented in this report are preliminary and upon coder review may  be revised to meet current compliance requirements. Xiadani Damman V. Devin Foskey, MD 03/01/2023 3:38:16 PM This report has been signed electronically. Number of Addenda: 0

## 2023-03-01 NOTE — Evaluation (Signed)
 Physical Therapy Evaluation Patient Details Name: Brian [name] MRN: [id number] DOB: [birth year] Today's Date: 03/01/2023  History of Present Illness  68 y.o. male admitted from [facility] 02/26/23 with lethargy and AMS after ingesting unknown pills. Workup revealed aflutter, CHF; temporary pacer placed 1/7-1/8. Coffee ground emesis 1/8; NGT placed 1/8. Plan EGD 1/10. PMH includes afib on Eliquis , HTN, CHF, HLD, CKD 3b, seizure disorder, R MCA CVA (residual L hemiparesis), squamous cell lung CA, depression.   Clinical Impression  Pt presents with an overall decrease in functional mobility secondary to above. PTA, pt has been at [facility] since 12/2022, reports working with PT/OT there; pt states mod indep ambulating with cane/walker, staff assists with iADLs as needed. Today, pt moving well with RW, able to ambulate in hallway with intermittent CGA for balance; asymptomatic with tachycardia (RN notified). Pt would benefit from continued acute PT services to maximize functional mobility and independence prior to return to SNF.     Post-ambulation BP 125/84 HR up to 167 with activity     If plan is discharge home, recommend the following: A little help with walking and/or transfers;A little help with bathing/dressing/bathroom;Assistance with cooking/housework;Assist for transportation   Can travel by private vehicle   Yes    Equipment Recommendations None recommended by PT  Recommendations for Other Services       Functional Status Assessment Patient has had a recent decline in their functional status and demonstrates the ability to make significant improvements in function in a reasonable and predictable amount of time.     Precautions / Restrictions Precautions Precautions: Fall;Other (comment) Precaution Comments: watch HR; h/o R MCA CVA with residual L-side weakness Restrictions Weight Bearing Restrictions Per Provider Order: No      Mobility  Bed Mobility Overal bed  mobility: Needs Assistance Bed Mobility: Rolling, Sidelying to Sit Rolling: Modified independent (Device/Increase time) Sidelying to sit: Supervision       General bed mobility comments: mod indep rolling R/L; sidelying>sit on R-side with supervision for safety/lines    Transfers Overall transfer level: Needs assistance Equipment used: Rolling walker (2 wheels) Transfers: Sit to/from Stand Sit to Stand: Contact guard assist           General transfer comment: initial sit<>stand from EOB without DME, RUE pulling on standing scale to stand for weight; additional sit<>stand from EOB and recliner to RW with CGA for balance/lines    Ambulation/Gait Ambulation/Gait assistance: Contact guard assist Gait Distance (Feet): 240 Feet Assistive device: Rolling walker (2 wheels) Gait Pattern/deviations: Step-through pattern, Decreased stride length, Trunk flexed Gait velocity: Decreased     General Gait Details: slow, mostly steady gait with RW and standby assist for balance/lines; pt making 180' with RW to change directions with mild instability, self-corrected with CGA, otherwise no overt instability or LOB; no apparent visual deficits navigating in crowded hallway (not formally assessed)  Stairs            Wheelchair Mobility     Tilt Bed    Modified Rankin (Stroke Patients Only)       Balance Overall balance assessment: Needs assistance Sitting-balance support: No upper extremity supported, Feet supported, Feet unsupported Sitting balance-Leahy Scale: Good     Standing balance support: During functional activity, Single extremity supported, Bilateral upper extremity supported Standing balance-Leahy Scale: Poor Standing balance comment: static and dynamic stability improved with UE support  Pertinent Vitals/Pain Pain Assessment Pain Assessment: No/denies pain    Home Living Family/patient expects to be discharged to::  Skilled nursing facility                   Additional Comments: has been at [facility] since 12/2022; shares room with roommate    Prior Function Prior Level of Function : Needs assist             Mobility Comments: reports mod indep ambulating with RW vs SPC; walks to dining hall though some meals provided in room. used to enjoys golf, denies current hobbies ADLs Comments: reports mod indep with self-care tasks; suspect SNF staff assist with ADL/iADLs as needed. pt reports some meals brought to room or he goes to dining hall for some     Extremity/Trunk Assessment   Upper Extremity Assessment Upper Extremity Assessment: LUE deficits/detail LUE Deficits / Details: h/o CVA with residual L-side weakness, resting in flexion/IR; gross shoulder AROM <90', elbow flex/ext 3-/5 unable to achieve full extension; wrist/finger <3/5    Lower Extremity Assessment Lower Extremity Assessment: LLE deficits/detail LLE Deficits / Details: h/o CVA with residual L-side weakness; gross LLE strength testing >3/5 throughout; denies decreased sensation       Communication   Communication Communication: No apparent difficulties  Cognition Arousal: Alert Behavior During Therapy: Flat affect Overall Cognitive Status: Within Functional Limits for tasks assessed                                 General Comments: WFL for simple tasks, not formally assessed. flat affect but answering questions and following simple commands appropriately; cooperative        General Comments General comments (skin integrity, edema, etc.): educ re: POC, activity recommendations, discharge needs    Exercises     Assessment/Plan    PT Assessment Patient needs continued PT services  PT Problem List Decreased strength;Decreased activity tolerance;Decreased balance;Decreased mobility;Cardiopulmonary status limiting activity       PT Treatment Interventions DME instruction;Gait  training;Functional mobility training;Therapeutic activities;Balance training;Therapeutic exercise;Patient/family education    PT Goals (Current goals can be found in the Care Plan section)  Acute Rehab PT Goals Patient Stated Goal: get out of hospital PT Goal Formulation: With patient Time For Goal Achievement: 03/15/23 Potential to Achieve Goals: Good    Frequency Min 1X/week     Co-evaluation PT/OT/SLP Co-Evaluation/Treatment: Yes Reason for Co-Treatment: Complexity of the patient's impairments (multi-system involvement);Necessary to address cognition/behavior during functional activity;For patient/therapist safety;To address functional/ADL transfers PT goals addressed during session: Mobility/safety with mobility;Balance;Proper use of DME         AM-PAC PT 6 Clicks Mobility  Outcome Measure Help needed turning from your back to your side while in a flat bed without using bedrails?: None Help needed moving from lying on your back to sitting on the side of a flat bed without using bedrails?: A Little Help needed moving to and from a bed to a chair (including a wheelchair)?: A Little Help needed standing up from a chair using your arms (e.g., wheelchair or bedside chair)?: A Little Help needed to walk in hospital room?: A Little Help needed climbing 3-5 steps with a railing? : A Little 6 Click Score: 19    End of Session Equipment Utilized During Treatment: Gait belt Activity Tolerance: Patient tolerated treatment well Patient left: in chair;with call bell/phone within reach Nurse Communication: Mobility status;Other (comment) Audria,  RN ok with no chair alarm; notified of HR) PT Visit Diagnosis: Other abnormalities of gait and mobility (R26.89)    Time: 8986-8964 PT Time Calculation (min) (ACUTE ONLY): 22 min   Charges:   PT Evaluation $PT Eval Moderate Complexity: 1 Mod   PT General Charges $$ ACUTE PT VISIT: 1 Visit       Darice Almas, PT, DPT Acute  Rehabilitation Services  Personal: Secure Chat Rehab Office: [phone]  Darice LITTIE Almas 03/01/2023, 11:08 AM

## 2023-03-01 NOTE — Evaluation (Signed)
 Occupational Therapy Evaluation Patient Details Name: Brian [name] MRN: [id number] DOB: [birth year] Today's Date: 03/01/2023   History of Present Illness 68 y.o. male admitted from [facility] 02/26/23 with lethargy and AMS after ingesting unknown pills. Workup revealed aflutter, CHF; temporary pacer placed 1/7-1/8. Coffee ground emesis 1/8; NGT placed 1/8. Plan EGD 1/10. PMH includes afib on Eliquis , HTN, CHF, HLD, CKD 3b, seizure disorder, R MCA CVA (residual L hemiparesis), squamous cell lung CA, depression.   Clinical Impression   Per pt report, at baseline, he completes ADLs largely Mod I and functional mobility Mod I with a SPC or RW.  At baseline, pt receives assistance from staff for IADLs. Pt now presents near baseline but with decreased activity tolerance, decreased balance, and impaired cardiopulmonary status affecting functional level. Pt currently demonstrates ability to complete ADLs Set up to Min assist and functional mobility/transfers with a RW with Contact guard assist. Pt with HR up to 167 bpm during session with RN notified. Pt will benefit from acute skilled OT services to address deficits outlined below and increase safety and independence with functional tasks. Post acute discharge, pt will benefit from return to [facility] with continued skilled OT services to maximize rehab potential.      If plan is discharge home, recommend the following: A little help with walking and/or transfers;A little help with bathing/dressing/bathroom;Assistance with cooking/housework;Assist for transportation;Help with stairs or ramp for entrance    Functional Status Assessment  Patient has had a recent decline in their functional status and demonstrates the ability to make significant improvements in function in a reasonable and predictable amount of time.  Equipment Recommendations  Other (comment) (defer to next level of care)    Recommendations for Other Services       Precautions /  Restrictions Precautions Precautions: Fall;Other (comment) Precaution Comments: watch HR; h/o R MCA CVA with residual L-side weakness Restrictions Weight Bearing Restrictions Per Provider Order: No      Mobility Bed Mobility Overal bed mobility: Needs Assistance Bed Mobility: Rolling, Sidelying to Sit Rolling: Modified independent (Device/Increase time) Sidelying to sit: Supervision       General bed mobility comments: mod indep rolling R/L; sidelying>sit on R-side with supervision for safety/lines    Transfers Overall transfer level: Needs assistance Equipment used: Rolling walker (2 wheels) Transfers: Sit to/from Stand, Bed to chair/wheelchair/BSC Sit to Stand: Contact guard assist     Step pivot transfers: Contact guard assist (with RW)     General transfer comment: initial sit<>stand from EOB without DME, RUE pulling on standing scale to stand for weight; additional sit<>stand from EOB and recliner to RW with CGA for balance/lines      Balance Overall balance assessment: Needs assistance Sitting-balance support: No upper extremity supported, Feet supported, Feet unsupported Sitting balance-Leahy Scale: Good     Standing balance support: Single extremity supported, Bilateral upper extremity supported, During functional activity, Reliant on assistive device for balance Standing balance-Leahy Scale: Poor Standing balance comment: static and dynamic stability improved with UE support                           ADL either performed or assessed with clinical judgement   ADL Overall ADL's : Needs assistance/impaired Eating/Feeding: Set up;Sitting   Grooming: Contact guard assist;Sitting   Upper Body Bathing: Minimal assistance;Cueing for compensatory techniques;Sitting   Lower Body Bathing: Minimal assistance;Cueing for compensatory techniques;Sitting/lateral leans;Sit to/from stand   Upper Body Dressing : Supervision/safety;Contact guard  assist;Cueing for  compensatory techniques;Sitting Upper Body Dressing Details (indicate cue type and reason): assist with lines Lower Body Dressing: Contact guard assist;Minimal assistance;Sitting/lateral leans;Sit to/from stand Lower Body Dressing Details (indicate cue type and reason): with increased time Toilet Transfer: Contact guard assist;BSC/3in1;Rolling walker (2 wheels) (step-pivot transfer)   Toileting- Clothing Manipulation and Hygiene: Contact guard assist;Sitting/lateral lean;Sit to/from stand       Functional mobility during ADLs: Contact guard assist;Rolling walker (2 wheels);Cueing for safety General ADL Comments: Pt likely near baseline PLOF but with mildly decreased activity tolerance, decreased balance, and impaired cardiopulmonary status affecting functional level.     Vision Patient Visual Report: No change from baseline Additional Comments: Not formally assessed. Vision appears [facility] for tasks assessed and pt reporting no change from baseline     Perception         Praxis         Pertinent Vitals/Pain Pain Assessment Pain Assessment: No/denies pain     Extremity/Trunk Assessment Upper Extremity Assessment Upper Extremity Assessment: Right hand dominant;LUE deficits/detail LUE Deficits / Details: h/o CVA with residual L-side weakness, resting in flexion/IR; gross shoulder AROM <90', elbow flex/ext 3-/5 unable to achieve full extension; wrist/finger <3/5 LUE Sensation: decreased light touch LUE Coordination: decreased fine motor;decreased gross motor   Lower Extremity Assessment Lower Extremity Assessment: Defer to PT evaluation LLE Deficits / Details: h/o CVA with residual L-side weakness; gross LLE strength testing >3/5 throughout; denies decreased sensation   Cervical / Trunk Assessment Cervical / Trunk Assessment: Kyphotic   Communication Communication Communication: No apparent difficulties   Cognition Arousal: Alert Behavior During Therapy: Flat affect Overall  Cognitive Status: Within Functional Limits for tasks assessed                                 General Comments: Largely WFL for simple tasks, not formally assessed. flat affect but answering questions and following simple commands appropriately; cooperative     General Comments  educ re: POC, activity recommendations, discharge needs; HR increased to 167 with activity with RN notified; BP following activity 125/84    Exercises     Shoulder Instructions      Home Living Family/patient expects to be discharged to:: Skilled nursing facility                                 Additional Comments: has been at [facility] since 12/2022; shares room with roommate      Prior Functioning/Environment Prior Level of Function : Needs assist             Mobility Comments: reports mod indep ambulating with RW vs SPC; walks to dining hall though some meals provided in room. used to enjoy golf, denies current hobbies ADLs Comments: reports mod indep with self-care tasks; suspect SNF staff assist with ADL/iADLs as needed. pt reports some meals brought to room or he goes to dining hall for some        OT Problem List: Decreased strength;Decreased activity tolerance;Impaired balance (sitting and/or standing);Impaired UE functional use      OT Treatment/Interventions: Self-care/ADL training;Therapeutic exercise;DME and/or AE instruction;Therapeutic activities;Patient/family education;Balance training    OT Goals(Current goals can be found in the care plan section) Acute Rehab OT Goals Patient Stated Goal: to return to [facility] OT Goal Formulation: With patient Time For Goal Achievement: 03/15/23 Potential to Achieve  Goals: Good ADL Goals Pt Will Perform Grooming: with modified independence;sitting Pt Will Perform Upper Body Bathing: with supervision;sitting Pt Will Perform Lower Body Bathing: with supervision;sitting/lateral leans;sit to/from stand Pt  Will Perform Lower Body Dressing: with supervision;sit to/from stand;sitting/lateral leans Pt Will Transfer to Toilet: with supervision;ambulating;regular height toilet;grab bars (with least restrictive AD) Pt Will Perform Toileting - Clothing Manipulation and hygiene: sitting/lateral leans;sit to/from stand;with supervision  OT Frequency: Min 1X/week    Co-evaluation PT/OT/SLP Co-Evaluation/Treatment: Yes Reason for Co-Treatment: Complexity of the patient's impairments (multi-system involvement);Necessary to address cognition/behavior during functional activity;For patient/therapist safety;To address functional/ADL transfers PT goals addressed during session: Mobility/safety with mobility;Balance;Proper use of DME OT goals addressed during session: ADL's and self-care;Strengthening/ROM      AM-PAC OT 6 Clicks Daily Activity     Outcome Measure Help from another person eating meals?: A Little Help from another person taking care of personal grooming?: A Little Help from another person toileting, which includes using toliet, bedpan, or urinal?: A Little Help from another person bathing (including washing, rinsing, drying)?: A Little Help from another person to put on and taking off regular upper body clothing?: A Little Help from another person to put on and taking off regular lower body clothing?: A Little 6 Click Score: 18   End of Session Equipment Utilized During Treatment: Rolling walker (2 wheels);Gait belt Nurse Communication: Mobility status;Other (comment) (HR; pt sitting up in recliner)  Activity Tolerance: Patient tolerated treatment well Patient left: in chair;with call bell/phone within reach  OT Visit Diagnosis: Unsteadiness on feet (R26.81);Other abnormalities of gait and mobility (R26.89);Muscle weakness (generalized) (M62.81);Other (comment) (decreased activity tolerance)                Time: 8986-8964 OT Time Calculation (min): 22 min Charges:  OT General  Charges $OT Visit: 1 Visit OT Evaluation $OT Eval Moderate Complexity: 1 Mod  Margarie Rockey HERO., OTR/L, MA Acute Rehab [phone]   Margarie FORBES Horns 03/01/2023, 1:48 PM

## 2023-03-01 NOTE — Anesthesia Preprocedure Evaluation (Addendum)
 Anesthesia Evaluation  Patient identified by MRN, date of birth, ID band Patient awake    Reviewed: Allergy & Precautions, H&P , NPO status , Patient's Chart, lab work & pertinent test results, reviewed documented beta blocker date and time   Airway Mallampati: III  TM Distance: >3 FB Neck ROM: Full    Dental no notable dental hx. (+) Poor Dentition, Dental Advisory Given   Pulmonary neg pulmonary ROS   Pulmonary exam normal breath sounds clear to auscultation       Cardiovascular hypertension, Pt. on medications and Pt. on home beta blockers  Rhythm:Regular Rate:Normal     Neuro/Psych Seizures -, Well Controlled,  CVA, Residual Symptoms  negative psych ROS   GI/Hepatic negative GI ROS, Neg liver ROS,,,  Endo/Other  diabetes, Insulin  Dependent, Oral Hypoglycemic Agents    Renal/GU Renal InsufficiencyRenal disease  negative genitourinary   Musculoskeletal  (+) Arthritis , Osteoarthritis,    Abdominal   Peds  Hematology  (+) Blood dyscrasia, anemia   Anesthesia Other Findings   Reproductive/Obstetrics negative OB ROS                             Anesthesia Physical Anesthesia Plan  ASA: 3  Anesthesia Plan: MAC   Post-op Pain Management: Minimal or no pain anticipated   Induction: Intravenous  PONV Risk Score and Plan: 1 and Propofol  infusion  Airway Management Planned: Natural Airway and Simple Face Mask  Additional Equipment:   Intra-op Plan:   Post-operative Plan:   Informed Consent: I have reviewed the patients History and Physical, chart, labs and discussed the procedure including the risks, benefits and alternatives for the proposed anesthesia with the patient or authorized representative who has indicated his/her understanding and acceptance.     Dental advisory given  Plan Discussed with: CRNA  Anesthesia Plan Comments:        Anesthesia Quick Evaluation

## 2023-03-01 NOTE — Progress Notes (Signed)
 NAME:  Brian [name], MRN:  [id number], DOB:  03-05-1955, LOS: 3 ADMISSION DATE:  02/26/2023, CONSULTATION DATE:  02/26/23 REFERRING MD:  Dr. 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  Per EMS report, unlabeled bill bottle on side table with large black capsule, pt did admit to ingesting two pills initially.  Unclear what capsule is at this time. Found in aflutter rate 40s and SBPs 70's treated with NS 500ml, atropine  x 2, and then given no improvement, started on epi gtt.  Some concern for possible CCB/ BB OD, on diltiazem  360mg  ER, and toprol  xl 200mg  per MAR. Unclear if pt doses his own meds or facility distributes.  Daughter reports pt has not been himself and depressed since being placed at [facility] in November following hospitalization for breakthrough seizure.  Additionally, pt's mother passed away several weeks ago.  No hx of prior SI attempts or known verbalization of SI per daughter, hx of prior marijuana abuse.  Concerned that one of his friends may have brought him something.   In ER, pt afebrile, remains paced on epi gtt, and requiring 2L [place].  Pt oriented to self  otherwise confused and able to follow commands.  No further emesis.  Pt denies taking substance.  Labs noted for normal lactic, K 5.6, BUN/sCr 52/ 2.84, trop hs 12, normal WBC, H/H at baseline, plts 130.  CXR showing cardiomegaly, no obvious infiltrate.  No obvious focal deficits.  Given additional atropine , then calcium  chloride, total of 3gm without improvement.   PCCM to admit, Cardiology consulted by EDP and pt being taken for TVP.    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 Alvia, laser therapy OD   Seizures [facility])    pt reports this is from low blood sugar   Significant Hospital Events: Including procedures, antibiotic start and stop dates in addition to other pertinent events   1/7 Admit symptomatic bradycardia, ?OD, cards s/p TVP 1/8 TVP removed, dark vomit in afternoon so transfer cancelled  Interim History / Subjective:  Wants to get out of here. HR fast on PRN BB  Objective   Blood pressure 126/78, pulse (!) 127, temperature 98 F (36.7 C), temperature source Axillary, resp. rate 17, height 5' 9 (1.753 m), weight 68.6 kg, SpO2 100%.        Intake/Output Summary (Last 24 hours) at 03/01/2023 0735 Last data filed at 03/01/2023 0600 Gross per 24 hour  Intake 99.84 ml  Output 1600 ml  Net -1500.16 ml   Filed Weights   02/26/23 1714 02/27/23 0500 02/28/23 0500  Weight: 72.6 kg 76 kg 68.6 kg  Examination: Chronically ill NGT with small bilious output 300 x 24h Lungs clear, no accessory muscle use Seems oriented just ornery Ext warm  Refused lab draw this am  Resolved problem list:   Hyperkalemia - resolved  Assessment & Plan:   Symptomatic bradycardia with concern for possible CCB/ BB overdose.  Known THC ingestion.  S/p TVP placement 02/26/23, now removed. Afib on Eliquis  HFrEF/ HFpEF EF 45-50%, G2DD, mild RV dysfunction HTN HLD Ileus Hx seizures- recently started back on keppra  12/2022 Hx CVA w/residual left hemiparesis AKI on CKD3b - improving DMT2, controlled  hyperglycemia. A1c 6.5 Anemia, thrombocytopenia. Dysphagia Debility  Told him fastest way out of here is EGD, he is agreeable  We don't have any objective evidence that he is having significant bleeding yet could just be ileus/gastroparesis  Again with labs, I will talk him into getting them drawn  Recheck EKG and reglan  challenge if Qtc looks okay  No SI, appreciate psych input  He really just wants to be out of the hospital, if we present him a safe, effective path forward he is agreeable to aggressive care  Best Practice (right click and Reselect all SmartList Selections daily)   Diet/type: NPO DVT prophylaxis SCD Pressure ulcer(s): N/A GI prophylaxis: N/A Lines: N/A Foley:  N/A Code Status:  full code Last date of multidisciplinary goals of care discussion [1/7]  Will try to call daughter later  Critical care time:     Rolan Sharps MD PCCM [facility]  For contact information, see Amion. If no response to pager, please call PCCM consult pager. After hours, 7PM- 7AM, please call Elink.

## 2023-03-01 NOTE — Progress Notes (Signed)
 03/01/2023 EGD benign To progressive to rechallenge with [facility], PT/OT eval, and better afib/RVR control by reintroducing BB  Appreciate TRH to take over 03/02/23  Myrla Halsted MD PCCM

## 2023-03-02 DIAGNOSIS — R001 Bradycardia, unspecified: Secondary | ICD-10-CM | POA: Diagnosis not present

## 2023-03-02 LAB — GLUCOSE, CAPILLARY
Glucose-Capillary: 191 mg/dL — ABNORMAL HIGH (ref 70–99)
Glucose-Capillary: 150 mg/dL — ABNORMAL HIGH (ref 70–99)
Glucose-Capillary: 199 mg/dL — ABNORMAL HIGH (ref 70–99)
Glucose-Capillary: 202 mg/dL — ABNORMAL HIGH (ref 70–99)
Glucose-Capillary: 108 mg/dL — ABNORMAL HIGH (ref 70–99)

## 2023-03-02 LAB — HEMOGLOBIN A1C
Hgb A1c MFr Bld: 6.6 % — ABNORMAL HIGH (ref 4.8–5.6)
Mean Plasma Glucose: 142.72 mg/dL

## 2023-03-02 LAB — CBC
HCT: 27.4 % — ABNORMAL LOW (ref 39.0–52.0)
Hemoglobin: 9 g/dL — ABNORMAL LOW (ref 13.0–17.0)
MCH: 28.8 pg (ref 26.0–34.0)
MCHC: 32.8 g/dL (ref 30.0–36.0)
MCV: 87.5 fL (ref 80.0–100.0)
Platelets: 155 10*3/uL (ref 150–400)
RBC: 3.13 MIL/uL — ABNORMAL LOW (ref 4.22–5.81)
RDW: 16.1 % — ABNORMAL HIGH (ref 11.5–15.5)
WBC: 3.2 10*3/uL — ABNORMAL LOW (ref 4.0–10.5)
nRBC: 0 % (ref 0.0–0.2)

## 2023-03-02 LAB — RENAL FUNCTION PANEL
Albumin: 2.6 g/dL — ABNORMAL LOW (ref 3.5–5.0)
Anion gap: 9 (ref 5–15)
BUN: 36 mg/dL — ABNORMAL HIGH (ref 8–23)
CO2: 27 mmol/L (ref 22–32)
Calcium: 8.5 mg/dL — ABNORMAL LOW (ref 8.9–10.3)
Chloride: 103 mmol/L (ref 98–111)
Creatinine, Ser: 1.81 mg/dL — ABNORMAL HIGH (ref 0.61–1.24)
GFR, Estimated: 40 mL/min — ABNORMAL LOW (ref >60)
Glucose, Bld: 203 mg/dL — ABNORMAL HIGH (ref 70–99)
Phosphorus: 3.1 mg/dL (ref 2.5–4.6)
Potassium: 4 mmol/L (ref 3.5–5.1)
Sodium: 139 mmol/L (ref 135–145)

## 2023-03-02 LAB — MAGNESIUM: Magnesium: 2.4 mg/dL (ref 1.7–2.4)

## 2023-03-02 MED ORDER — INSULIN ASPART 100 UNIT/ML IJ SOLN: 0.0000 [IU] | Freq: Every day | INTRAMUSCULAR | Status: DC

## 2023-03-02 MED ORDER — CARVEDILOL 12.5 MG PO TABS
12.5000 mg | ORAL_TABLET | Freq: Two times a day (BID) | ORAL | Status: DC
  Administered 2023-03-02 – 2023-03-03 (×3): 12.5 mg via ORAL
  Filled 2023-03-02 (×3): qty 1

## 2023-03-02 MED ORDER — CARVEDILOL 12.5 MG PO TABS: 12.5000 mg | ORAL_TABLET | Freq: Two times a day (BID) | ORAL | Status: DC

## 2023-03-02 MED ORDER — LEVETIRACETAM 500 MG PO TABS
500.0000 mg | ORAL_TABLET | Freq: Two times a day (BID) | ORAL | Status: DC
  Administered 2023-03-02 – 2023-03-07 (×11): 500 mg via ORAL
  Filled 2023-03-02: qty 2
  Filled 2023-03-02 (×10): qty 1

## 2023-03-02 MED ORDER — INSULIN ASPART 100 UNIT/ML IJ SOLN
0.0000 [IU] | Freq: Three times a day (TID) | INTRAMUSCULAR | Status: DC
  Administered 2023-03-02: 1 [IU] via SUBCUTANEOUS
  Administered 2023-03-02: 2 [IU] via SUBCUTANEOUS
  Administered 2023-03-03: 1 [IU] via SUBCUTANEOUS
  Administered 2023-03-03: 2 [IU] via SUBCUTANEOUS
  Administered 2023-03-04 – 2023-03-05 (×3): 1 [IU] via SUBCUTANEOUS
  Administered 2023-03-06: 2 [IU] via SUBCUTANEOUS
  Administered 2023-03-06 – 2023-03-07 (×2): 1 [IU] via SUBCUTANEOUS

## 2023-03-02 MED ORDER — ATORVASTATIN CALCIUM 10 MG PO TABS
20.0000 mg | ORAL_TABLET | Freq: Every day | ORAL | Status: DC
  Administered 2023-03-02 – 2023-03-07 (×6): 20 mg via ORAL
  Filled 2023-03-02 (×6): qty 2

## 2023-03-02 MED ORDER — EMPAGLIFLOZIN 25 MG PO TABS
25.0000 mg | ORAL_TABLET | Freq: Every day | ORAL | Status: DC
  Administered 2023-03-02 – 2023-03-05 (×4): 25 mg via ORAL
  Filled 2023-03-02 (×5): qty 1

## 2023-03-02 MED ORDER — PANTOPRAZOLE SODIUM 40 MG PO TBEC
40.0000 mg | DELAYED_RELEASE_TABLET | Freq: Two times a day (BID) | ORAL | Status: DC
  Administered 2023-03-02 – 2023-03-07 (×10): 40 mg via ORAL
  Filled 2023-03-02 (×10): qty 1

## 2023-03-02 NOTE — Progress Notes (Addendum)
 Progress Note   Patient: Brian [name] FMW:[id number] DOB: 1955/06/21 DOA: 02/26/2023     4 DOS: the patient was seen and examined on 03/02/2023   Brief hospital course: 68 year old male with history of A-fib on Eliquis , chronic combined heart failure, hypertension, dyslipidemia, CKD 3b, seizure disorder and history of right MCA CVA with visual left hemiparesis and squamous cell carcinoma of the tongue.  He presented to [facility] to the ER after being found with altered mentation, emesis and have dissolved pill in the emesis.  He was also symptomatic with bradycardia and hypotensive.  Per EMS there was unlabeled pill bottle on the side table with large black capsule and patient did admit to ingesting 2 pills prior to his event.  Pills were not prescription medicine and it is unknown what ingredients are in the pills.  Presenting rhythm was a flutter with rates in the 40s and systolic blood pressure in the 70s.  He was given 500 cc of saline, atropine  x 2.  He did not improve so was started on an epinephrine  drip.  He was admitted to the ICU by pulmonary critical care medicine.  Cardiology was consulted by EDP and eventually a transvenous pacemaker was placed.  For admission he was noted to have coffee-ground emesis and anemia therefore GI was consulted.  Patient underwent an EGD on 1/10 and this revealed mild gastritis and duodenitis.  Patient's heart rate has recovered and now he is having sustained tachycardia.  He was reinitiated on low-dose beta-blockers on 1/10.  Plans are to resume anticoagulation once cleared with GI.  Patient was transferred from [facility] to [facility] to assume care on 1/11.  He was also evaluated by psychiatry team due to family concerns over profound depression since the patient's mother passed away recently.  He was found to be at low risk of self-harm and no additional psychiatric interventions recommended.  Assessment and Plan: Symptomatic bradycardia with complete  heart block status post transvenous pacemaker Concerns were for possible calcium  channel blocker beta-blocker overdose Now tachycardic Has been his pacemaker has been discontinued  Atrial fibrillation with RVR Patient now with sustained heart rates in the 120s but tolerating well with BP greater than 100 Carvedilol  resumed at low-dose on 1/10.  Dose adjustments of rate control agents per cardiology team (Today plan is to increase carvedilol  dose and possibly resume short acting Cardizem  for rate control) Eliquis  on hold until cleared by gastroenterology-currently challenging with Lovenox  before initiating Eliquis   Acute gastritis and duodenitis Continue Protonix  twice daily Await GI clearance to resume anticoagulation/Eliquis  No signs of bleeding since add on of Lovenox -hgb has drifted down from 9.8 to 9.0 so will need to follow closely  HFrEF/HFpEF EF 45 to 50%, grade 2 diastolic dysfunction, mild RV dysfunction Appears euvolemic and stable on room air Resume Jardiance  as part of GDMT Was on Cozaar  100 mg and metoprolol  200 mg daily prior to admission  Diabetes mellitus 2 Continue Jardiance  Initiate CBG checks with sliding scale insulin  Hemoglobin A1c was 6.22 September 2022-peak this admission  Hypertension Blood pressure although not hypotensive is suboptimal in context of ongoing tachycardia Continue beta-blocker as above Has IV hydralazine  available for BP greater than 160 BP meds as above prior to admission, was also on hydralazine  50 mg 3 times daily  Dyslipidemia Continue Lipitor  Seizure disorder Has been receiving IV Keppra .  Now that he is more stable can transition back to oral dosing  CKD 3b Cr peaked at 2.0 during this hospitalization and  currently is 1.81 Baseline between 1.57 and 1.73  Anemia/thrombocytopenia Platelets are normal at this time Hemoglobin stable at 9.0 WBC slightly low at 3.2 Has Feraheme IV ordered weekly  History of remote CVA with residual  left hemiparesis/dysphagia Patient required stay at [facility] after hospitalization recently for breakthrough seizures associated physical deconditioning In the interim his mother has passed away and he was living with his mother prior  He states he will not be able to return to where his mother was living in discharge disposition unclear at this time SLP as scheduled patient for modified barium swallow just pending-currently on clear liquid diet -will add protein shakes and hopefully after SLP evaluation can upgrade to solid diet  Physical debility/protein calorie malnutrition Per PT and OT notes patient needs a little help with walking and transfers as well as with bathing, dressing and toileting.  He will need assistance with cooking and housework and assistance with transportation As noted above disposition unclear at this time patient presented from [facility] after an admission for rehabilitation after hospitalization Patient noted with low albumin, BMI 22.6 Nutrition consultation Protein supplementation as above       Subjective: Patient examined while sitting up in bed.  He has no awareness of his tachycardia.  He denies chest pain or shortness of breath.  He is still grieving the loss of his mother.  Physical Exam: Vitals:   03/02/23 0700 03/02/23 0800 03/02/23 0809 03/02/23 0838  BP: 104/88 (!) 149/112 (!) 149/110 (!) 145/114  Pulse: (!) 125 (!) 129 (!) 133 (!) 140  Resp: 16 (!) 21    Temp:      TempSrc:      SpO2: 99% 97%    Weight:      Height:       Constitutional: NAD, calm, comfortable Respiratory: clear to auscultation bilaterally, no wheezing, no crackles. Normal respiratory effort. No accessory muscle use.  Room air Cardiovascular: Slightly irregular rate that is tachycardic with ventricular response in the 120s, no murmurs / rubs / gallops. No extremity edema. 2+ pedal pulses.  Abdomen: no tenderness, no masses palpated. Bowel sounds positive.   Musculoskeletal: no clubbing / cyanosis. No joint deformity upper and lower extremities. Good ROM, chronic left upper extremity contracture. Normal muscle tone.  Skin: no rashes, lesions, ulcers. No induration Neurologic: CN 2-12 grossly intact. Sensation intact, Strength 4/5 x all extremities except for left upper extremity which has dense left hemiparesis from previous stroke Psychiatric: Alert and oriented x 3.  Pleasant mood does become sad when talking about the death of his mother Data Reviewed:  Sodium 139, potassium 4.0, glucose 203, BUN 36, creatinine 1.81, albumin 2.6  WBC 3.2, differential not obtained, hemoglobin 9.0, platelets 155,000  Family Communication: Patient only  Disposition: Status is: Inpatient Remains inpatient appropriate because: Persistent tachycardia with need to adjust AV nodal agents, physical deconditioning.  Continue to monitor for any subtle blood loss anemia from gastritis and duodenitis.  Need to observe patient while in hospital after reintroduction of anticoagulation.  Planned Discharge Destination: Skilled nursing facility for rehab i.e. return back to [facility] if qualifies    Time spent: 55 minutes  Author: Isaiah Lever, NP 03/02/2023 9:23 AM  For on call review [email or web address].   Patient was seen, examined,treatment plan was discussed with the Advance Practice Provider. I have personally reviewed the clinical findings, labs, EKG, imaging studies and management of this patient in detail. I have also reviewed the orders written for this patient which  were under my direction. I agree with the documentation, as recorded by the Advance Practice Provider.  Patient from short-term SNF admitted with bradycardia likely related to polypharmacy.  No active GI issues.  Today on Lovenox .  Cardiology following.  Now with rebound tachycardia.  Medically stabilizing.  Can be transferred to progressive bed.  Work with PT OT.  Refer back to SNF once rate is  controlled.

## 2023-03-02 NOTE — Progress Notes (Signed)
 Speech Language Pathology Treatment: Dysphagia  Patient Details Name: Brian [name] MRN: [id number] DOB: [birth year] Today's Date: 03/02/2023 Time: 1222-1238 SLP Time Calculation (min) (ACUTE ONLY): 16 min  Assessment / Plan / Recommendation Clinical Impression  Pt cleared by GI for advancement of diet as indicated this date. Per RN, pt has been alert and taking clear liquids and whole pills without significant issues. He has been asking for solid food and is eager to consume something other than clear liquids. Pt upright in chair consuming clear liquid lunch upon SLP arrival for tx session. He recalled some details of MBS performed in 2023 and a few strategies focused on during time spent in OP therapy for dysphagia. With min- mod verbal cues he recalled use of intermittent throat clear after sips of liquids to clear any aspirate and liquid wash to clear any pharyngeal residuals. Pt proceeded to consume sips of thin liquids in consecutive swallows with occasional throat clears exhibited, vocal quality remaining clear. Trials of regular textured graham cracker and mechanical soft peach fruit cup consumed with x1 throat clear exhibited, which may have been related to pharyngeal residuals (shown on MBS). With minimal verbal cues, pt took liquid wash throughout solid intake, which improved s/sx. Recommend resume baseline diet of dys 3/thin liquids with adherence to swallow precautions and strategies (small bites/sips, slow rate, intermittent hard throat clear after sips, liquid wash as needed). Pt expressed agreement with recommendations and recalled all swallow strategies given x2 verbal cues at end of session. Will f/u for diet tolerance and carry over of swallow strategies.     HPI HPI: Brian [name] is a 68 yo male presenting to ED 1/7 after being found with AMS after episode of emesis with half dissolved pill in emesis. Underwent pacemaker placement 1/7. Worked with OP SLP to target dysphagia  goals following MBS 10/26/21 with mild-moderate pharyngeal dysphagia suspected to be related to post-radiation changes with edematous tissue particularly noted at the epiglottis and arytenoids. He did aspirate thin liquids but cleared them when cued to perform strong throat clearance and was recommended a mechanical soft diet with thin liquids. Per that SLP, given that penetration is likely with thicker liquids (and aspiration possible across a meal), there is increased residue, and pt has remained stable from a pulmonary standpoint, recommend continuing with thin liquids in the setting of thorough oral care and implementation of a cough or more effortful throat clear post-swallow. PMH includes A-fib on Eliquis , systolic and diastolic HF, HTN, HLD, CKD, seizure disorder, R MCA CVA with residual L hemiparesis, SCC of tongue      SLP Plan  Continue with current plan of care      Recommendations for follow up therapy are one component of a multi-disciplinary discharge planning process, led by the attending physician.  Recommendations may be updated based on patient status, additional functional criteria and insurance authorization.    Recommendations  Diet recommendations: Dysphagia 3 (mechanical soft);Thin liquid Liquids provided via: Cup;Straw Medication Administration: Whole meds with liquid Supervision: Patient able to self feed;Intermittent supervision to cue for compensatory strategies Compensations: Minimize environmental distractions;Slow rate;Small sips/bites;Clear throat after each swallow;Follow solids with liquid Postural Changes and/or Swallow Maneuvers: Seated upright 90 degrees                  Oral care BID     Dysphagia, unspecified (R13.10)     Continue with current plan of care     Chelsae Zanella G Trenell Moxey  03/02/2023, 12:46 PM

## 2023-03-02 NOTE — Progress Notes (Signed)
 Progress Note  Patient Name: Brian [name] Date of Encounter: 03/02/2023  Primary Cardiologist:   Lynwood Schilling, MD   Subjective   Denies pain or SOB. Eating.    Inpatient Medications    Scheduled Meds:  atorvastatin   20 mg Per Tube Daily   carvedilol   6.25 mg Oral BID WC   Chlorhexidine  Gluconate Cloth  6 each Topical Daily   enoxaparin  (LOVENOX ) injection  70 mg Subcutaneous Q12H   feeding supplement  1 Container Oral TID BM   pantoprazole  (PROTONIX ) IV  40 mg Intravenous Q12H   sodium chloride  flush  3 mL Intravenous Q12H   Continuous Infusions:  ferumoxytol  (FERAHEME) 510 mg in sodium chloride  0.9 % 100 mL IVPB Stopped (02/26/23 2038)   levETIRAcetam  Stopped (03/01/23 2124)   PRN Meds: hydrALAZINE , metoprolol  tartrate, ondansetron  (ZOFRAN ) IV, mouth rinse, sodium chloride  flush   Vital Signs    Vitals:   03/02/23 0100 03/02/23 0200 03/02/23 0300 03/02/23 0500  BP: 117/85 96/67 (!) 91/55   Pulse: (!) 124 (!) 122 (!) 107   Resp: 16 18 15    Temp:      TempSrc:      SpO2: 98% 96% 97%   Weight:    69.5 kg  Height:        Intake/Output Summary (Last 24 hours) at 03/02/2023 0756 Last data filed at 03/02/2023 0100 Gross per 24 hour  Intake 1186.45 ml  Output 935 ml  Net 251.45 ml   Filed Weights   03/01/23 1000 03/01/23 1342 03/02/23 0500  Weight: 69.5 kg 69.5 kg 69.5 kg    Telemetry    Atrial flutter with increased ventricular rate - Personally Reviewed  ECG    NA - Personally Reviewed  Physical Exam   GEN: No acute distress.   Neck: No  JVD Cardiac: RRR, no murmurs, rubs, or gallops.  Respiratory: Clear  to auscultation bilaterally. GI: Soft, nontender, non-distended  MS: No  edema; No deformity. Neuro:  Nonfocal  Psych: Normal affect   Labs    Chemistry Recent Labs  Lab 02/26/23 1551 02/26/23 1843 02/28/23 0639 03/01/23 0835 03/02/23 0337  NA 134*   < > 138 141 139  K 5.6*   < > 4.3 4.1 4.0  CL 105   < > 102 103 103  CO2  20*   < > 27 24 27   GLUCOSE 114*   < > 104* 73 203*  BUN 52*   < > 43* 39* 36*  CREATININE 2.84*   < > 2.04* 1.85* 1.81*  CALCIUM  8.3*   < > 8.6* 8.7* 8.5*  PROT 5.3*  --   --   --   --   ALBUMIN 2.9*  --   --   --  2.6*  AST 13*  --   --   --   --   ALT 18  --   --   --   --   ALKPHOS 60  --   --   --   --   BILITOT 0.6  --   --   --   --   GFRNONAA 23*   < > 35* 39* 40*  ANIONGAP 9   < > 9 14 9    < > = values in this interval not displayed.     Hematology Recent Labs  Lab 02/28/23 9360 02/28/23 9357 03/01/23 0835 03/02/23 0337  WBC 3.6*  --  3.7* 3.2*  RBC 3.43*  --  3.36* 3.13*  HGB 9.8* 9.8* 9.8* 9.0*  HCT 30.5* 29.8* 30.2* 27.4*  MCV 88.9  --  89.9 87.5  MCH 28.6  --  29.2 28.8  MCHC 32.1  --  32.5 32.8  RDW 16.3*  --  15.9* 16.1*  PLT 150  --  177 155    Cardiac EnzymesNo results for input(s): TROPONINI in the last 168 hours. No results for input(s): TROPIPOC in the last 168 hours.   BNP Recent Labs  Lab 02/26/23 1853  BNP 303.0*     DDimer  Recent Labs  Lab 02/26/23 1551  DDIMER 0.86*     Radiology    No results found.  Cardiac Studies   Echo  1. Cannot fully assess wall motion. Left ventricular ejection fraction,  by estimation, is 60 to 65%. The left ventricle has normal function. There  is moderate concentric left ventricular hypertrophy. Left ventricular  diastolic parameters are  indeterminate.   2. Right ventricular systolic function is mildly reduced. The right  ventricular size is mildly enlarged. Tricuspid regurgitation signal is  inadequate for assessing PA pressure.   3. Left atrial size was moderately dilated.   4. Right atrial size was mildly dilated.   5. The mitral valve was not well visualized. No evidence of mitral valve  regurgitation.   6. The aortic valve was not well visualized. Aortic valve regurgitation  is not visualized.   7. The inferior vena cava is dilated in size with >50% respiratory  variability,  suggesting right atrial pressure of 8 mmHg.   Patient Profile     68 y.o. male history of AF, prior strokes with hx of conduction disease.   Assessment & Plan   CHB:   Resolved.  He had temporary venous pacer for a while.No flutter with increased rate.  Will titrate meds as below.   Atrial flutter:   Resume anticoagulation when cleared by GI.  I been getting low-dose IV beta-blocker for rate control.   He also started carvedilol  yesterday.   Will increase today and might need to resume IR Cardizem  for rate control as well.  He was no this previously.   GI bleed: GI evaluation completed with endoscopy yesterday.  Gastritis.     Need to resume DOAC when cleared by GI.    Chronic systolic HF:    Ejection fraction improved.   Euvolemic  HTN:    Blood pressure is labile.  We will begin slow titration of his medications.  Will still hold off on ARB while creatinine is improving   AKI:    Creatinine is still elevated and essentially unchanged.  AMS:   This improved.  For questions or updates, please contact [facility] Please consult [email or web address] for contact info under Cardiology/STEMI.   Signed, Lynwood Schilling, MD  03/02/2023, 7:56 AM

## 2023-03-02 NOTE — Progress Notes (Signed)
 Patient's HR has been steadily increasing from 1900, now sitting at 130s consistently. Metoprolol prn given per parameters.

## 2023-03-03 DIAGNOSIS — R001 Bradycardia, unspecified: Secondary | ICD-10-CM | POA: Diagnosis not present

## 2023-03-03 LAB — GLUCOSE, CAPILLARY
Glucose-Capillary: 136 mg/dL — ABNORMAL HIGH (ref 70–99)
Glucose-Capillary: 105 mg/dL — ABNORMAL HIGH (ref 70–99)
Glucose-Capillary: 168 mg/dL — ABNORMAL HIGH (ref 70–99)
Glucose-Capillary: 138 mg/dL — ABNORMAL HIGH (ref 70–99)
Glucose-Capillary: 160 mg/dL — ABNORMAL HIGH (ref 70–99)

## 2023-03-03 LAB — CBC
HCT: 26.5 % — ABNORMAL LOW (ref 39.0–52.0)
Hemoglobin: 8.6 g/dL — ABNORMAL LOW (ref 13.0–17.0)
MCH: 28.4 pg (ref 26.0–34.0)
MCHC: 32.5 g/dL (ref 30.0–36.0)
MCV: 87.5 fL (ref 80.0–100.0)
Platelets: 140 10*3/uL — ABNORMAL LOW (ref 150–400)
RBC: 3.03 MIL/uL — ABNORMAL LOW (ref 4.22–5.81)
RDW: 15.9 % — ABNORMAL HIGH (ref 11.5–15.5)
WBC: 2.5 10*3/uL — ABNORMAL LOW (ref 4.0–10.5)
nRBC: 0 % (ref 0.0–0.2)

## 2023-03-03 LAB — RENAL FUNCTION PANEL
Albumin: 2.7 g/dL — ABNORMAL LOW (ref 3.5–5.0)
Anion gap: 8 (ref 5–15)
BUN: 28 mg/dL — ABNORMAL HIGH (ref 8–23)
CO2: 26 mmol/L (ref 22–32)
Calcium: 8.5 mg/dL — ABNORMAL LOW (ref 8.9–10.3)
Chloride: 102 mmol/L (ref 98–111)
Creatinine, Ser: 1.66 mg/dL — ABNORMAL HIGH (ref 0.61–1.24)
GFR, Estimated: 44 mL/min — ABNORMAL LOW (ref >60)
Glucose, Bld: 122 mg/dL — ABNORMAL HIGH (ref 70–99)
Phosphorus: 3.3 mg/dL (ref 2.5–4.6)
Potassium: 3.6 mmol/L (ref 3.5–5.1)
Sodium: 136 mmol/L (ref 135–145)

## 2023-03-03 MED ORDER — ORAL CARE MOUTH RINSE
15.0000 mL | OROMUCOSAL | Status: DC
  Administered 2023-03-03 – 2023-03-07 (×5): 15 mL via OROMUCOSAL

## 2023-03-03 MED ORDER — APIXABAN 5 MG PO TABS
5.0000 mg | ORAL_TABLET | Freq: Two times a day (BID) | ORAL | Status: DC
  Administered 2023-03-03 – 2023-03-07 (×9): 5 mg via ORAL
  Filled 2023-03-03 (×9): qty 1

## 2023-03-03 MED ORDER — DILTIAZEM HCL 30 MG PO TABS
30.0000 mg | ORAL_TABLET | Freq: Four times a day (QID) | ORAL | Status: DC
  Administered 2023-03-03 – 2023-03-04 (×4): 30 mg via ORAL
  Filled 2023-03-03 (×4): qty 1

## 2023-03-03 MED ORDER — ORAL CARE MOUTH RINSE: 15.0000 mL | OROMUCOSAL | Status: DC | PRN

## 2023-03-03 NOTE — Progress Notes (Signed)
 PROGRESS NOTE  Brian [name]  FMW:[id number] DOB: March 22, 1955 DOA: 02/26/2023 PCP: Duanne Butler DASEN, MD   Brief Narrative: Patient is a 68 year old male with history of A-fib on Eliquis , chronic combined CHF, hypertension, hyperlipidemia, CKD stage IIIb, seizure disorder, CVA with residual left hemiparesis, squamous cell carcinoma of the tongue who presented from skilled nursing facility with confusion, vomiting.  On presentation he was bradycardic and hypotensive.  Rhythm was A-flutter with rate in the range of 40s, blood pressure systolic was in the range of 70s.  Started on vasopressors and admitted to ICU under PCCM service.  Cardiology consulted, transvenous pacemaker placed.  Hospital course also remarkable for coffee-ground emesis, anemia, GI consulted.  EGD done on 1/10 showed mild gastritis, duodenitis.  Currently she is A-fib with RVR.  Transferred to [facility] service on 1/11.    Assessment & Plan:  Principal Problem:   Symptomatic bradycardia Active Problems:   Ileus (HCC)   Debility   AKI (acute kidney injury) (HCC)   Adjustment disorder with depressed mood   Marijuana use   Coffee ground emesis   Nausea and vomiting   Symptomatic bradycardia/complete heart block: Initial rhythm was A- flutter with bradycardia.  Status post transvenous pacemaker placement.  Pacemaker has been discontinued.  A-fib with RVR: Currently on A-fib with RVR.  On carvedilol .  Was on Lovenox  for anticoagulation.  Will resume Eliquis .  Cardiology adding Cardizem .  Monitor on telemetry  Suspect upper GI bleed: Found to have coffee-ground emesis during this hospitalization.  EGD done on 1/10 showed acute gastritis, duodenitis.  Continue Protonix .  Currently hemoglobin in the range of 8.  GI signed off, resumed Eliquis   Chronic combined systolic/diastolic heart failure: Echo has shown EF of 45 to 50%, grade 2 diastolic function.  Currently euvolemic.  On Jardiance  .  Was taking Cozaar , metoprolol   previously  Type II diabetes: On Jardiance .  Recent A1c of 6.6.  Currently on sliding scale  Hypertension: Presented with hypotension.  Was taking Cozaar , metoprolol , hydralazine  previously.  Currently on Coreg .  Blood pressure stable  History of CVA: Has residual left hemiparesis.  Lives at [facility]. Speech therapy following for dysphagia.  Recommended dysphagia 3 diet  Pancytopenia: Currently hemoglobin in the range of 8.6.  Has mild leukopenia and thrombocytopenia.  Also given IV iron.  Seizure disorder: On Keppra   Hyperlipidemia: On Lipitor at home  CKD stage IIIb: Baseline creatinine around 1.5-1.7.  Currently confused and close to baseline.  Depression: There was concern for severe depression during this hospitalization.  Psychiatry consulted.  Patient refused antidepressant therapy.  Psychiatry has no contraindication on discharge to previous environment  Debility/deconditioning: Patient is living at nursing facility.  Discharge planning back to nursing facility when medically stable.  PT/OT consulted.         DVT prophylaxis:SCDs Start: 02/26/23 1632     Code Status: Full Code  Family Communication: None at bedside  Patient status:Inpatient  Patient is from :SNF  Anticipated discharge to:SnF  Estimated DC date:1-2 days   Consultants: Cardiology,pccm, GI  Procedures:EGD  Antimicrobials:  Anti-infectives (From admission, onward)    None       Subjective: Patient seen and examined at bedside today.  Hemodynamically stable.  He was comfortable lying in bed.  Appears comfortable.  Alert and oriented.  EKG monitor showed A-fib with RVR in the rate of 120s to 130s.  Denies chest pain or shortness of breath.  No nausea, abdominal pain  Objective: Vitals:   03/02/23 1946 03/03/23 0143  03/03/23 0313 03/03/23 0724  BP: 92/72 107/80  113/88  Pulse: (!) 117 (!) 121  (!) 129  Resp: 16 16  16   Temp: 97.6 F (36.4 C) 97.9 F (36.6 C)  97.6 F (36.4 C)   TempSrc: Axillary Axillary  Oral  SpO2: 97% 97%  97%  Weight:   71.1 kg   Height:        Intake/Output Summary (Last 24 hours) at 03/03/2023 0742 Last data filed at 03/02/2023 2031 Gross per 24 hour  Intake 1240 ml  Output --  Net 1240 ml   Filed Weights   03/01/23 1342 03/02/23 0500 03/03/23 0313  Weight: 69.5 kg 69.5 kg 71.1 kg    Examination:  General exam: Overall comfortable, not in distress, lying on bed HEENT: PERRL Respiratory system:  no wheezes or crackles  Cardiovascular system: Irregularly irregular rhythm Gastrointestinal system: Abdomen is nondistended, soft and nontender. Central nervous system: Alert and oriented Extremities: No edema, no clubbing ,no cyanosis Skin: Scattered bruises, no ulcers,no icterus     Data Reviewed: I have personally reviewed following labs and imaging studies  CBC: Recent Labs  Lab 02/26/23 1551 02/26/23 1843 02/27/23 0252 02/27/23 1929 02/28/23 0639 02/28/23 0642 03/01/23 0835 03/02/23 0337 03/03/23 0218  WBC 4.0   < > 3.6*  --  3.6*  --  3.7* 3.2* 2.5*  NEUTROABS 3.0  --   --   --   --   --   --   --   --   HGB 8.6*   < > 9.4*   < > 9.8* 9.8* 9.8* 9.0* 8.6*  HCT 27.7*   < > 29.6*   < > 30.5* 29.8* 30.2* 27.4* 26.5*  MCV 91.7   < > 89.7  --  88.9  --  89.9 87.5 87.5  PLT 130*   < > 149*  --  150  --  177 155 140*   < > = values in this interval not displayed.   Basic Metabolic Panel: Recent Labs  Lab 02/26/23 1843 02/26/23 2326 02/27/23 0252 02/28/23 9360 03/01/23 0835 03/02/23 0337 03/03/23 0218  NA  --    < > 137 138 141 139 136  K  --    < > 4.9 4.3 4.1 4.0 3.6  CL  --    < > 107 102 103 103 102  CO2  --    < > 21* 27 24 27 26   GLUCOSE  --    < > 71 104* 73 203* 122*  BUN  --    < > 48* 43* 39* 36* 28*  CREATININE 2.69*   < > 2.60* 2.04* 1.85* 1.81* 1.66*  CALCIUM   --    < > 9.2 8.6* 8.7* 8.5* 8.5*  MG 2.6*  --  2.6* 2.4 2.4 2.4  --   PHOS  --   --  4.9* 4.7* 3.9 3.1 3.3   < > = values in this  interval not displayed.     Recent Results (from the past 240 hours)  MRSA Next Gen by PCR, Nasal     Status: None   Collection Time: 02/26/23  7:02 PM   Specimen: Urine, Clean Catch; Nasal Swab  Result Value Ref Range Status   MRSA by PCR Next Gen NOT DETECTED NOT DETECTED Final    Comment: (NOTE) The GeneXpert MRSA Assay (FDA approved for NASAL specimens only), is one component of a comprehensive MRSA colonization surveillance program. It is not intended to diagnose  MRSA infection nor to guide or monitor treatment for MRSA infections. Test performance is not FDA approved in patients less than 58 years old. Performed at [facility], [address] [place], [place] [address]      Radiology Studies: No results found.  Scheduled Meds:  atorvastatin   20 mg Oral Daily   carvedilol   12.5 mg Oral BID WC   Chlorhexidine  Gluconate Cloth  6 each Topical Daily   empagliflozin   25 mg Oral Daily   enoxaparin  (LOVENOX ) injection  70 mg Subcutaneous Q12H   feeding supplement  1 Container Oral TID BM   insulin  aspart  0-5 Units Subcutaneous QHS   insulin  aspart  0-9 Units Subcutaneous TID WC   levETIRAcetam   500 mg Oral BID   mouth rinse  15 mL Mouth Rinse 4 times per day   pantoprazole   40 mg Oral BID AC   sodium chloride  flush  3 mL Intravenous Q12H   Continuous Infusions:  ferumoxytol  (FERAHEME) 510 mg in sodium chloride  0.9 % 100 mL IVPB Stopped (02/26/23 2038)     LOS: 5 days   Ivonne Mustache, MD [facility] P1/01/2024, 7:42 AM

## 2023-03-03 NOTE — TOC Initial Note (Signed)
 Transition of Care [facility]) - Initial/Assessment Note    Patient Details  Name: Brian [name] MRN: [id number] Date of Birth: May 29, 1955  Transition of Care [facility]) CM/SW Contact:    Darin JULIANNA Bend, [name] Phone Number: 03/03/2023, 12:40 PM  Clinical Narrative:                 CSW followed-up disposition recommendations (SNF placement).  CSW completed initial TOC work-up/assessment as noted by the following below.   CSW spoke with the patient at the bedside to review SNF referral process per clinical recommendations and assessed the pt's SNF preference.   CSW referral efforts to support the patient's disposition FL2:  PASRR:  SNF referrals  TOC Disposition follow-up needs  Please provide the patient or natural support with bed-offer updates.  Please continue with SNF placement efforts. Please update the clinical team to SNF placement efforts:  No other needs identified by this clinical research associate currently. Patient needs and current disposition to be followed by    Expected Discharge Plan: Skilled Nursing Facility Barriers to Discharge: Continued Medical Work up   Patient Goals and CMS Choice Patient states their goals for this hospitalization and ongoing recovery are:: To get back to baseline prior to admission   Choice offered to / list presented to : Adult Children, Sibling [place] ownership interest in [facility].provided to:: Sibling    Expected Discharge Plan and Services In-house Referral: Clinical Social Work   Post Acute Care Choice: Skilled Nursing Facility Living arrangements for the past 2 months: Apartment                                      Prior Living Arrangements/Services Living arrangements for the past 2 months: Apartment Lives with:: Other (Comment) (Family home) Patient language and need for interpreter reviewed:: No        Need for Family Participation in Patient Care: Yes (Comment) Care giver support system in place?: Yes (comment)    Criminal Activity/Legal Involvement Pertinent to Current Situation/Hospitalization: No - Comment as needed  Activities of Daily Living   ADL Screening (condition at time of admission) Independently performs ADLs?: Yes (appropriate for developmental age) Is the patient deaf or have difficulty hearing?: No Does the patient have difficulty seeing, even when wearing glasses/contacts?: No Does the patient have difficulty concentrating, remembering, or making decisions?: No  Permission Sought/Granted Permission sought to share information with : Case Manager, Magazine Features Editor Permission granted to share information with : Yes, Verbal Permission Granted              Emotional Assessment       Orientation: : Oriented to Self, Oriented to Place Alcohol  / Substance Use: Not Applicable Psych Involvement: No (comment)  Admission diagnosis:  Hyperkalemia [E87.5] Shock (HCC) [R57.9] Symptomatic bradycardia [R00.1] AKI (acute kidney injury) (HCC) [N17.9] Bradycardia with 31-40 beats per minute [R00.1] Patient Active Problem List   Diagnosis Date Noted   Coffee ground emesis 03/01/2023   Nausea and vomiting 03/01/2023   Ileus (HCC) 02/28/2023   Debility 02/28/2023   AKI (acute kidney injury) (HCC) 02/28/2023   Adjustment disorder with depressed mood 02/28/2023   Marijuana use 02/28/2023   Symptomatic bradycardia 02/26/2023   Seizure (HCC) 01/08/2023   Pericardial effusion 01/07/2023   Seizure-like activity (HCC) 01/07/2023   Gait disturbance 01/07/2023   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) 11/18/2022   Pleural effusion 10/30/2022   History of head and neck cancer 10/17/2022   Chronic congestive heart failure with left ventricular diastolic dysfunction (HCC) 11/19/2021   History of CVA with residual deficit 11/03/2021   Hypokalemia 11/03/2021   Dysphagia 11/03/2021   Fall at home,  subsequent encounter 11/03/2021   Diastolic dysfunction 11/03/2021   Pancytopenia (HCC) 11/03/2021   Primary cancer of left lower lobe of lung (HCC) 06/13/2021   Normocytic normochromic anemia 03/20/2021   Stage 3b chronic kidney disease (HCC) 03/13/2021   Syncope and collapse 02/02/2021   Dilated cardiomyopathy (HCC) 02/02/2021   Weight loss, unintentional 05/26/2020   Insomnia 05/09/2020   Chronic kidney disease, stage III (moderate) (HCC) 05/09/2020   Chemotherapy induced diarrhea 05/09/2020   Malignant neoplasm of base of tongue (HCC) 03/02/2020   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 [facility])    Status epilepticus (HCC) 01/18/2013   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 status epilepticus, not intractable (HCC) 01/15/2013   Hyperlipidemia    Long term current use of anticoagulant 03/11/2010   Diabetes mellitus, type II (HCC) 12/20/2009   Essential hypertension, benign 12/20/2009   Atrial fibrillation (HCC) 12/20/2009   Systolic heart failure (HCC) 12/20/2009   CEREBROVASCULAR ACCIDENT 12/20/2009   PCP:  Duanne Butler DASEN, MD Pharmacy:   [id number] [address], [place] - [address] [place] [address] Phone: [phone] Fax: [phone]     Social Drivers of Health (SDOH) Social History: SDOH Screenings   Food Insecurity: No Food Insecurity (03/01/2023)  Housing: Low Risk  (03/01/2023)  Transportation Needs: Unmet Transportation Needs (03/01/2023)  Utilities: Not At Risk (03/01/2023)  Alcohol  Screen: Low Risk  (11/14/2021)  Depression (PHQ2-9): Low Risk  (04/05/2022)  Financial Resource Strain: Low Risk  (04/05/2022)  Physical Activity: Inactive (04/05/2022)  Social Connections: Socially Isolated (03/01/2023)  Stress: No Stress Concern Present (04/05/2022)  Tobacco Use: Low Risk  (03/01/2023)   SDOH Interventions:     Readmission Risk Interventions    11/08/2021    9:46 AM  Readmission Risk Prevention Plan  Transportation  Screening Complete  PCP or Specialist Appt within 3-5 Days Complete  HRI or Home Care Consult Complete  Social Work Consult for Recovery Care Planning/Counseling Complete  Palliative Care Screening Not Applicable  Medication Review Oceanographer) Complete

## 2023-03-03 NOTE — [place] FL2 (Signed)
 [place]  MEDICAID FL2 LEVEL OF CARE FORM     IDENTIFICATION  Patient Name: Brian [name] Birthdate: 06/30/1955 Sex: male Admission Date (Current Location): 02/26/2023  [facility] and [place] Number:  Producer, Television/film/video and Address:  [facility], [address], [place], [place] [address]      Provider Number: [id number]  Attending Physician Name and Address:  Jillian Buttery, MD  Relative Name and Phone Number:       Current Level of Care: SNF Recommended Level of Care: Skilled Nursing Facility Prior Approval Number:    Date Approved/Denied: 03/03/23 PASRR Number: [id number]  Discharge Plan: SNF    Current Diagnoses: Patient Active Problem List   Diagnosis Date Noted   Coffee ground emesis 03/01/2023   Nausea and vomiting 03/01/2023   Ileus (HCC) 02/28/2023   Debility 02/28/2023   AKI (acute kidney injury) (HCC) 02/28/2023   Adjustment disorder with depressed mood 02/28/2023   Marijuana use 02/28/2023   Symptomatic bradycardia 02/26/2023   Seizure (HCC) 01/08/2023   Pericardial effusion 01/07/2023   Seizure-like activity (HCC) 01/07/2023   Gait disturbance 01/07/2023   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) 11/18/2022   Pleural effusion 10/30/2022   History of head and neck cancer 10/17/2022   Chronic congestive heart failure with left ventricular diastolic dysfunction (HCC) 11/19/2021   History of CVA with residual deficit 11/03/2021   Hypokalemia 11/03/2021   Dysphagia 11/03/2021   Fall at home, subsequent encounter 11/03/2021   Diastolic dysfunction 11/03/2021   Pancytopenia (HCC) 11/03/2021   Primary cancer of left lower lobe of lung (HCC) 06/13/2021   Normocytic normochromic anemia 03/20/2021   Stage 3b chronic kidney disease (HCC) 03/13/2021   Syncope and collapse 02/02/2021   Dilated cardiomyopathy (HCC) 02/02/2021   Weight loss, unintentional  05/26/2020   Insomnia 05/09/2020   Chronic kidney disease, stage III (moderate) (HCC) 05/09/2020   Chemotherapy induced diarrhea 05/09/2020   Malignant neoplasm of base of tongue (HCC) 03/02/2020   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 [facility])    Status epilepticus (HCC) 01/18/2013   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 status epilepticus, not intractable (HCC) 01/15/2013   Hyperlipidemia    Long term current use of anticoagulant 03/11/2010   Diabetes mellitus, type II (HCC) 12/20/2009   Essential hypertension, benign 12/20/2009   Atrial fibrillation (HCC) 12/20/2009   Systolic heart failure (HCC) 12/20/2009   CEREBROVASCULAR ACCIDENT 12/20/2009    Orientation RESPIRATION BLADDER Height & Weight     Time, Self, Situation, Place  Normal Incontinent Weight: 156 lb 12.8 oz (71.1 kg) Height:  5' 9 (175.3 cm)  BEHAVIORAL SYMPTOMS/MOOD NEUROLOGICAL BOWEL NUTRITION STATUS      Continent Diet  AMBULATORY STATUS COMMUNICATION OF NEEDS Skin   Limited Assist Verbally Normal                       Personal Care Assistance Level of Assistance              Functional Limitations Info             SPECIAL CARE FACTORS FREQUENCY  PT (By licensed PT), OT (By licensed OT)     PT Frequency: 5x OT Frequency: 3x            Contractures Contractures Info: Not present    Additional Factors Info  Code Status Code Status Info: Full  Current Medications (03/03/2023):  This is the current hospital active medication list Current Facility-Administered Medications  Medication Dose Route Frequency Provider Last Rate Last Admin   apixaban  (ELIQUIS ) tablet 5 mg  5 mg Oral BID Dasie Freund, RPH   5 mg at 03/03/23 1241   atorvastatin  (LIPITOR) tablet 20 mg  20 mg Oral Daily Dasie Freund, RPH   20 mg at 03/03/23 9171   Chlorhexidine  Gluconate Cloth 2 % PADS 6 each  6 each Topical Daily Claudene Toribio BROCKS, MD   6 each at 03/02/23 1330   diltiazem  (CARDIZEM ) tablet 30 mg  30 mg Oral Q6H Hochrein,  James, MD   30 mg at 03/03/23 1136   empagliflozin  (JARDIANCE ) tablet 25 mg  25 mg Oral Daily Alto Isaiah CROME, NP   25 mg at 03/03/23 9171   feeding supplement (BOOST / RESOURCE BREEZE) liquid 1 Container  1 Container Oral TID BM Claudene Toribio BROCKS, MD   1 Container at 03/03/23 1241   ferumoxytol  (FERAHEME) 510 mg in sodium chloride  0.9 % 100 mL IVPB  510 mg Intravenous Weekly Peeples, Samuel J, MD   Stopped at 02/26/23 2038   hydrALAZINE  (APRESOLINE ) injection 5 mg  5 mg Intravenous Q4H PRN Smith, Daniel C, MD       insulin  aspart (novoLOG ) injection 0-5 Units  0-5 Units Subcutaneous QHS Alto Isaiah CROME, NP       insulin  aspart (novoLOG ) injection 0-9 Units  0-9 Units Subcutaneous TID WC Alto Isaiah CROME, NP   2 Units at 03/03/23 1136   levETIRAcetam  (KEPPRA ) tablet 500 mg  500 mg Oral BID Alto Isaiah CROME, NP   500 mg at 03/03/23 9171   metoprolol  tartrate (LOPRESSOR ) injection 5 mg  5 mg Intravenous Q4H PRN Paliwal, Aditya, MD   5 mg at 03/03/23 9171   ondansetron  (ZOFRAN ) injection 4 mg  4 mg Intravenous Q8H PRN Simpson, Paula B, NP   4 mg at 02/27/23 1759   Oral care mouth rinse  15 mL Mouth Rinse 4 times per day Raenelle Coria, MD       Oral care mouth rinse  15 mL Mouth Rinse PRN Raenelle Coria, MD       pantoprazole  (PROTONIX ) EC tablet 40 mg  40 mg Oral BID AC Ellis, Allison L, NP   40 mg at 03/03/23 9176   sodium chloride  flush (NS) 0.9 % injection 3 mL  3 mL Intravenous Q12H Anner Alm ORN, MD   3 mL at 03/03/23 9173   sodium chloride  flush (NS) 0.9 % injection 3 mL  3 mL Intravenous PRN Anner Alm ORN, MD   3 mL at 02/26/23 2033     Discharge Medications: Please see discharge summary for a list of discharge medications.  Relevant Imaging Results:  Relevant Lab Results:   Additional Information SSN [id number]  Darin JULIANNA Bend, [place]

## 2023-03-03 NOTE — [place] FL2 (Signed)
 [place]  MEDICAID FL2 LEVEL OF CARE FORM     IDENTIFICATION  Patient Name: Brian [name] Birthdate: 1955-12-04 Sex: male Admission Date (Current Location): 02/26/2023  [facility] and [place] Number:  Producer, Television/film/video and Address:  [facility] [place], [address], [place], [place] [address]      Provider Number: [id number]  Attending Physician Name and Address:  Jillian Buttery, MD  Relative Name and Phone Number:       Current Level of Care: SNF Recommended Level of Care: Skilled Nursing Facility Prior Approval Number:    Date Approved/Denied:   PASRR Number:    Discharge Plan: SNF    Current Diagnoses: Patient Active Problem List   Diagnosis Date Noted   Coffee ground emesis 03/01/2023   Nausea and vomiting 03/01/2023   Ileus (HCC) 02/28/2023   Debility 02/28/2023   AKI (acute kidney injury) (HCC) 02/28/2023   Adjustment disorder with depressed mood 02/28/2023   Marijuana use 02/28/2023   Symptomatic bradycardia 02/26/2023   Seizure (HCC) 01/08/2023   Pericardial effusion 01/07/2023   Seizure-like activity (HCC) 01/07/2023   Gait disturbance 01/07/2023   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) 11/18/2022   Pleural effusion 10/30/2022   History of head and neck cancer 10/17/2022   Chronic congestive heart failure with left ventricular diastolic dysfunction (HCC) 11/19/2021   History of CVA with residual deficit 11/03/2021   Hypokalemia 11/03/2021   Dysphagia 11/03/2021   Fall at home, subsequent encounter 11/03/2021   Diastolic dysfunction 11/03/2021   Pancytopenia (HCC) 11/03/2021   Primary cancer of left lower lobe of lung (HCC) 06/13/2021   Normocytic normochromic anemia 03/20/2021   Stage 3b chronic kidney disease (HCC) 03/13/2021   Syncope and collapse 02/02/2021   Dilated cardiomyopathy (HCC) 02/02/2021   Weight loss, unintentional 05/26/2020    Insomnia 05/09/2020   Chronic kidney disease, stage III (moderate) (HCC) 05/09/2020   Chemotherapy induced diarrhea 05/09/2020   Malignant neoplasm of base of tongue (HCC) 03/02/2020   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 [facility])    Status epilepticus (HCC) 01/18/2013   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 status epilepticus, not intractable (HCC) 01/15/2013   Hyperlipidemia    Long term current use of anticoagulant 03/11/2010   Diabetes mellitus, type II (HCC) 12/20/2009   Essential hypertension, benign 12/20/2009   Atrial fibrillation (HCC) 12/20/2009   Systolic heart failure (HCC) 12/20/2009   CEREBROVASCULAR ACCIDENT 12/20/2009    Orientation RESPIRATION BLADDER Height & Weight     Time, Self, Situation, Place  Normal Incontinent Weight: 156 lb 12.8 oz (71.1 kg) Height:  5' 9 (175.3 cm)  BEHAVIORAL SYMPTOMS/MOOD NEUROLOGICAL BOWEL NUTRITION STATUS      Continent Diet  AMBULATORY STATUS COMMUNICATION OF NEEDS Skin   Limited Assist Verbally Normal                       Personal Care Assistance Level of Assistance              Functional Limitations Info             SPECIAL CARE FACTORS FREQUENCY  PT (By licensed PT), OT (By licensed OT)     PT Frequency: 5x OT Frequency: 3x            Contractures Contractures Info: Not present    Additional Factors Info  Code Status Code Status Info: Full  Current Medications (03/03/2023):  This is the current hospital active medication list Current Facility-Administered Medications  Medication Dose Route Frequency Provider Last Rate Last Admin   apixaban  (ELIQUIS ) tablet 5 mg  5 mg Oral BID Dasie Freund, RPH   5 mg at 03/03/23 1241   atorvastatin  (LIPITOR) tablet 20 mg  20 mg Oral Daily Dasie Freund, RPH   20 mg at 03/03/23 9171   Chlorhexidine  Gluconate Cloth 2 % PADS 6 each  6 each Topical Daily Claudene Toribio BROCKS, MD   6 each at 03/02/23 1330   diltiazem  (CARDIZEM ) tablet 30 mg  30 mg Oral Q6H Hochrein, James, MD    30 mg at 03/03/23 1136   empagliflozin  (JARDIANCE ) tablet 25 mg  25 mg Oral Daily Alto Isaiah CROME, NP   25 mg at 03/03/23 9171   feeding supplement (BOOST / RESOURCE BREEZE) liquid 1 Container  1 Container Oral TID BM Claudene Toribio BROCKS, MD   1 Container at 03/03/23 1241   ferumoxytol  (FERAHEME) 510 mg in sodium chloride  0.9 % 100 mL IVPB  510 mg Intravenous Weekly Peeples, Samuel J, MD   Stopped at 02/26/23 2038   hydrALAZINE  (APRESOLINE ) injection 5 mg  5 mg Intravenous Q4H PRN Smith, Daniel C, MD       insulin  aspart (novoLOG ) injection 0-5 Units  0-5 Units Subcutaneous QHS Alto Isaiah CROME, NP       insulin  aspart (novoLOG ) injection 0-9 Units  0-9 Units Subcutaneous TID WC Alto Isaiah CROME, NP   2 Units at 03/03/23 1136   levETIRAcetam  (KEPPRA ) tablet 500 mg  500 mg Oral BID Alto Isaiah CROME, NP   500 mg at 03/03/23 9171   metoprolol  tartrate (LOPRESSOR ) injection 5 mg  5 mg Intravenous Q4H PRN Paliwal, Aditya, MD   5 mg at 03/03/23 9171   ondansetron  (ZOFRAN ) injection 4 mg  4 mg Intravenous Q8H PRN Simpson, Paula B, NP   4 mg at 02/27/23 1759   Oral care mouth rinse  15 mL Mouth Rinse 4 times per day Raenelle Coria, MD       Oral care mouth rinse  15 mL Mouth Rinse PRN Raenelle Coria, MD       pantoprazole  (PROTONIX ) EC tablet 40 mg  40 mg Oral BID AC Ellis, Allison L, NP   40 mg at 03/03/23 0823   sodium chloride  flush (NS) 0.9 % injection 3 mL  3 mL Intravenous Q12H Anner Alm ORN, MD   3 mL at 03/03/23 0826   sodium chloride  flush (NS) 0.9 % injection 3 mL  3 mL Intravenous PRN Anner Alm ORN, MD   3 mL at 02/26/23 2033     Discharge Medications: Please see discharge summary for a list of discharge medications.  Relevant Imaging Results:  Relevant Lab Results:   Additional Information SSN [id number]  Brian [name], [place]

## 2023-03-03 NOTE — Plan of Care (Signed)
  Problem: Education: Goal: Ability to describe self-care measures that may prevent or decrease complications (Diabetes Survival Skills Education) will improve Outcome: Progressing   Problem: Coping: Goal: Ability to adjust to condition or change in health will improve Outcome: Progressing   Problem: Fluid Volume: Goal: Ability to maintain a balanced intake and output will improve Outcome: Progressing   Problem: Health Behavior/Discharge Planning: Goal: Ability to identify and utilize available resources and services will improve Outcome: Progressing Goal: Ability to manage health-related needs will improve Outcome: Progressing   Problem: Metabolic: Goal: Ability to maintain appropriate glucose levels will improve Outcome: Progressing   Problem: Nutritional: Goal: Maintenance of adequate nutrition will improve Outcome: Progressing Goal: Progress toward achieving an optimal weight will improve Outcome: Progressing   Problem: Skin Integrity: Goal: Risk for impaired skin integrity will decrease Outcome: Progressing   Problem: Tissue Perfusion: Goal: Adequacy of tissue perfusion will improve Outcome: Progressing   Problem: Education: Goal: Knowledge of General Education information will improve Description: Including pain rating scale, medication(s)/side effects and non-pharmacologic comfort measures Outcome: Progressing   Problem: Health Behavior/Discharge Planning: Goal: Ability to manage health-related needs will improve Outcome: Progressing   Problem: Clinical Measurements: Goal: Ability to maintain clinical measurements within normal limits will improve Outcome: Progressing Goal: Will remain free from infection Outcome: Progressing Goal: Diagnostic test results will improve Outcome: Progressing Goal: Respiratory complications will improve Outcome: Progressing Goal: Cardiovascular complication will be avoided Outcome: Progressing   Problem: Activity: Goal:  Risk for activity intolerance will decrease Outcome: Progressing   Problem: Nutrition: Goal: Adequate nutrition will be maintained Outcome: Progressing   Problem: Coping: Goal: Level of anxiety will decrease Outcome: Progressing   Problem: Elimination: Goal: Will not experience complications related to bowel motility Outcome: Progressing Goal: Will not experience complications related to urinary retention Outcome: Progressing   Problem: Pain Management: Goal: General experience of comfort will improve Outcome: Progressing   Problem: Safety: Goal: Ability to remain free from injury will improve Outcome: Progressing   Problem: Skin Integrity: Goal: Risk for impaired skin integrity will decrease Outcome: Progressing   Problem: Education: Goal: Understanding of CV disease, CV risk reduction, and recovery process will improve Outcome: Progressing   Problem: Activity: Goal: Ability to return to baseline activity level will improve Outcome: Progressing   Problem: Cardiovascular: Goal: Ability to achieve and maintain adequate cardiovascular perfusion will improve Outcome: Progressing Goal: Vascular access site(s) Level 0-1 will be maintained Outcome: Progressing   Problem: Health Behavior/Discharge Planning: Goal: Ability to safely manage health-related needs after discharge will improve Outcome: Progressing

## 2023-03-03 NOTE — Progress Notes (Signed)
 Progress Note  Patient Name: Brian [name] Date of Encounter: 03/03/2023  Primary Cardiologist:   Lynwood Schilling, MD   Subjective   No chest pain.  No SOB.   Inpatient Medications    Scheduled Meds:  atorvastatin   20 mg Oral Daily   carvedilol   12.5 mg Oral BID WC   Chlorhexidine  Gluconate Cloth  6 each Topical Daily   empagliflozin   25 mg Oral Daily   enoxaparin  (LOVENOX ) injection  70 mg Subcutaneous Q12H   feeding supplement  1 Container Oral TID BM   insulin  aspart  0-5 Units Subcutaneous QHS   insulin  aspart  0-9 Units Subcutaneous TID WC   levETIRAcetam   500 mg Oral BID   mouth rinse  15 mL Mouth Rinse 4 times per day   pantoprazole   40 mg Oral BID AC   sodium chloride  flush  3 mL Intravenous Q12H   Continuous Infusions:  ferumoxytol  (FERAHEME) 510 mg in sodium chloride  0.9 % 100 mL IVPB Stopped (02/26/23 2038)   PRN Meds: hydrALAZINE , metoprolol  tartrate, ondansetron  (ZOFRAN ) IV, mouth rinse, sodium chloride  flush   Vital Signs    Vitals:   03/03/23 0724 03/03/23 0800 03/03/23 0822 03/03/23 0823  BP: 113/88 (!) 125/106    Pulse: (!) 129 (!) 128 (!) 147 (!) 140  Resp: 16 16 16  (!) 21  Temp: 97.6 F (36.4 C)     TempSrc: Oral     SpO2: 97% 97% 96% 97%  Weight:      Height:        Intake/Output Summary (Last 24 hours) at 03/03/2023 1030 Last data filed at 03/03/2023 0900 Gross per 24 hour  Intake 840 ml  Output --  Net 840 ml   Filed Weights   03/01/23 1342 03/02/23 0500 03/03/23 0313  Weight: 69.5 kg 69.5 kg 71.1 kg    Telemetry    Atrial fib with RVR- Personally Reviewed  ECG    NA - Personally Reviewed  Physical Exam   GEN: No  acute distress.   Neck: No  JVD Cardiac: Irregular RR, no murmurs, rubs, or gallops.  Respiratory: Clear   to auscultation bilaterally. GI: Soft, nontender, non-distended, normal bowel sounds  MS:   No edema; No deformity. Neuro:   Nonfocal  Psych: Oriented and appropriate    Labs     Chemistry Recent Labs  Lab 02/26/23 1551 02/26/23 1843 03/01/23 0835 03/02/23 0337 03/03/23 0218  NA 134*   < > 141 139 136  K 5.6*   < > 4.1 4.0 3.6  CL 105   < > 103 103 102  CO2 20*   < > 24 27 26   GLUCOSE 114*   < > 73 203* 122*  BUN 52*   < > 39* 36* 28*  CREATININE 2.84*   < > 1.85* 1.81* 1.66*  CALCIUM  8.3*   < > 8.7* 8.5* 8.5*  PROT 5.3*  --   --   --   --   ALBUMIN 2.9*  --   --  2.6* 2.7*  AST 13*  --   --   --   --   ALT 18  --   --   --   --   ALKPHOS 60  --   --   --   --   BILITOT 0.6  --   --   --   --   GFRNONAA 23*   < > 39* 40* 44*  ANIONGAP 9   < >  14 9 8    < > = values in this interval not displayed.     Hematology Recent Labs  Lab 03/01/23 0835 03/02/23 0337 03/03/23 0218  WBC 3.7* 3.2* 2.5*  RBC 3.36* 3.13* 3.03*  HGB 9.8* 9.0* 8.6*  HCT 30.2* 27.4* 26.5*  MCV 89.9 87.5 87.5  MCH 29.2 28.8 28.4  MCHC 32.5 32.8 32.5  RDW 15.9* 16.1* 15.9*  PLT 177 155 140*    Cardiac EnzymesNo results for input(s): TROPONINI in the last 168 hours. No results for input(s): TROPIPOC in the last 168 hours.   BNP Recent Labs  Lab 02/26/23 1853  BNP 303.0*     DDimer  Recent Labs  Lab 02/26/23 1551  DDIMER 0.86*     Radiology    No results found.  Cardiac Studies   Echo  1. Cannot fully assess wall motion. Left ventricular ejection fraction,  by estimation, is 60 to 65%. The left ventricle has normal function. There  is moderate concentric left ventricular hypertrophy. Left ventricular  diastolic parameters are  indeterminate.   2. Right ventricular systolic function is mildly reduced. The right  ventricular size is mildly enlarged. Tricuspid regurgitation signal is  inadequate for assessing PA pressure.   3. Left atrial size was moderately dilated.   4. Right atrial size was mildly dilated.   5. The mitral valve was not well visualized. No evidence of mitral valve  regurgitation.   6. The aortic valve was not well visualized.  Aortic valve regurgitation  is not visualized.   7. The inferior vena cava is dilated in size with >50% respiratory  variability, suggesting right atrial pressure of 8 mmHg.   Patient Profile     68 y.o. male history of AF, prior strokes with hx of conduction disease.   Assessment & Plan   CHB:   Resolved.  He had temporary venous pacer for a while.   Will titrate meds as below.   Atrial flutter:   Resume anticoagulation when cleared by GI.  Started on Coreg .  Rate is creeping up.  I am going to add low dose Cardizem .  Discontinue Coreg  and start Cardizem  IR and will consolidate to CD before discharge.      GI bleed: GI evaluation completed with endoscopy.  Gastritis.     Need to resume DOAC when cleared by GI.    Chronic systolic HF:    Ejection fraction improved.   Seems to be euvolemic.     HTN:    Blood pressure is labile.  Holding off on ARB.  See above.     AKI:    Creatinine is slowly coming down.    AMS:   This improved.  For questions or updates, please contact [facility] Please consult [email or web address] for contact info under Cardiology/STEMI.   Signed, Lynwood Schilling, MD  03/03/2023, 10:30 AM

## 2023-03-04 ENCOUNTER — Encounter (HOSPITAL_COMMUNITY): Payer: Self-pay | Admitting: Gastroenterology

## 2023-03-04 DIAGNOSIS — R001 Bradycardia, unspecified: Secondary | ICD-10-CM | POA: Diagnosis not present

## 2023-03-04 LAB — GLUCOSE, CAPILLARY
Glucose-Capillary: 114 mg/dL — ABNORMAL HIGH (ref 70–99)
Glucose-Capillary: 141 mg/dL — ABNORMAL HIGH (ref 70–99)
Glucose-Capillary: 116 mg/dL — ABNORMAL HIGH (ref 70–99)
Glucose-Capillary: 116 mg/dL — ABNORMAL HIGH (ref 70–99)

## 2023-03-04 LAB — CBC
HCT: 26 % — ABNORMAL LOW (ref 39.0–52.0)
Hemoglobin: 8.5 g/dL — ABNORMAL LOW (ref 13.0–17.0)
MCH: 28.9 pg (ref 26.0–34.0)
MCHC: 32.7 g/dL (ref 30.0–36.0)
MCV: 88.4 fL (ref 80.0–100.0)
Platelets: 144 10*3/uL — ABNORMAL LOW (ref 150–400)
RBC: 2.94 MIL/uL — ABNORMAL LOW (ref 4.22–5.81)
RDW: 15.9 % — ABNORMAL HIGH (ref 11.5–15.5)
WBC: 2.6 10*3/uL — ABNORMAL LOW (ref 4.0–10.5)
nRBC: 0 % (ref 0.0–0.2)

## 2023-03-04 LAB — BASIC METABOLIC PANEL
Anion gap: 8 (ref 5–15)
BUN: 28 mg/dL — ABNORMAL HIGH (ref 8–23)
CO2: 25 mmol/L (ref 22–32)
Calcium: 8.3 mg/dL — ABNORMAL LOW (ref 8.9–10.3)
Chloride: 100 mmol/L (ref 98–111)
Creatinine, Ser: 1.98 mg/dL — ABNORMAL HIGH (ref 0.61–1.24)
GFR, Estimated: 36 mL/min — ABNORMAL LOW (ref >60)
Glucose, Bld: 143 mg/dL — ABNORMAL HIGH (ref 70–99)
Potassium: 4.1 mmol/L (ref 3.5–5.1)
Sodium: 133 mmol/L — ABNORMAL LOW (ref 135–145)

## 2023-03-04 MED ORDER — DILTIAZEM HCL ER COATED BEADS 180 MG PO CP24
180.0000 mg | ORAL_CAPSULE | Freq: Every day | ORAL | Status: DC
  Administered 2023-03-04 – 2023-03-07 (×4): 180 mg via ORAL
  Filled 2023-03-04 (×4): qty 1

## 2023-03-04 NOTE — Care Management Important Message (Signed)
 Important Message  Patient Details  Name: Brian [name] MRN: [id number] Date of Birth: 06-13-1955   Important Message Given:  Yes - Medicare IM     Dorena Bodo 03/04/2023, 2:14 PM

## 2023-03-04 NOTE — Progress Notes (Signed)
 Mobility Specialist Progress Note:    03/04/23 1400  Mobility  Activity Ambulated with assistance in hallway  Level of Assistance Contact guard assist, steadying assist  Assistive Device Front wheel walker  Distance Ambulated (ft) 250 ft  RLE Weight Bearing Per Provider Order WBAT  LLE Weight Bearing Per Provider Order WBAT  Activity Response Tolerated well  Mobility Referral Yes  Mobility visit 1 Mobility  Mobility Specialist Start Time (ACUTE ONLY) 1437  Mobility Specialist Stop Time (ACUTE ONLY) 1448  Mobility Specialist Time Calculation (min) (ACUTE ONLY) 11 min   Pt received in bed and agreeable. No complaints throughout. Returned to room w/o fault. Pt left in chair with call bell and all needs met. Chair alarm on.  [facility]ante [place]olaus Mobility Specialist Please contact via Special Educational Needs Teacher or Rehab office at [phone]

## 2023-03-04 NOTE — Progress Notes (Addendum)
 PROGRESS NOTE  Brian [name]  FMW:[id number] DOB: [birth year] DOA: 02/26/2023 PCP: Duanne Butler DASEN, MD   Brief Narrative: Patient is a 68 year old male with history of A-fib on Eliquis , chronic combined CHF, hypertension, hyperlipidemia, CKD stage IIIb, seizure disorder, CVA with residual left hemiparesis, squamous cell carcinoma of the tongue who presented from skilled nursing facility with confusion, vomiting.  On presentation he was bradycardic and hypotensive.  Rhythm was A-flutter with rate in the range of 40s, blood pressure systolic was in the range of 70s.  Started on vasopressors and admitted to ICU under PCCM service.  Cardiology consulted, transvenous pacemaker placed.  Hospital course also remarkable for coffee-ground emesis, anemia, GI consulted.  EGD done on 1/10 showed mild gastritis, duodenitis.   Transferred to [facility] service on 1/11.  Hospital course remarkable for A-fib with RVR, now rate has been controlled.  Possible plan for discharge tomorrow to SNF  Assessment & Plan:  Principal Problem:   Symptomatic bradycardia Active Problems:   Ileus (HCC)   Debility   AKI (acute kidney injury) (HCC)   Adjustment disorder with depressed mood   Marijuana use   Coffee ground emesis   Nausea and vomiting   Symptomatic bradycardia/complete heart block: Initial rhythm was A- flutter with bradycardia.  Status post transvenous pacemaker placement.  Pacemaker has been discontinued.  A-fib with RVR: Continue Eliquis , long-acting Cardizem .  Monitor on telemetry.  Rate is controlled this morning  Suspect upper GI bleed: Found to have coffee-ground emesis during this hospitalization.  EGD done on 1/10 showed acute gastritis, duodenitis.  Continue Protonix .  Currently hemoglobin in the range of 8.  GI signed off, resumed Eliquis   Chronic combined systolic/diastolic heart failure: Echo has shown EF of 45 to 50%, grade 2 diastolic function.  Currently euvolemic.  On Jardiance  .  Was taking  Cozaar , metoprolol  previously  Type II diabetes: On Jardiance .  Recent A1c of 6.6.  Currently on sliding scale  Hypertension: Presented with hypotension.  Was taking Cozaar , metoprolol , hydralazine  previously.  Currently on Cardizem .  Blood pressure stable  History of CVA: Has residual left hemiparesis.  Lives at [facility]. Speech therapy following for dysphagia.  Recommended dysphagia 3 diet  Pancytopenia: Currently hemoglobin in the range of 8.6.  Has mild leukopenia and thrombocytopenia.  Also given IV iron.  Seizure disorder: On Keppra   Hyperlipidemia: On Lipitor at home  CKD stage IIIb: Baseline creatinine around 1.5-1.7.  Creatinine of 1.9 today  Depression: There was concern for severe depression during this hospitalization.  Psychiatry consulted.  Patient refused antidepressant therapy.  Psychiatry has no contraindication on discharge to previous environment.  Needs to follow-up with psychiatry as an outpatient  Debility/deconditioning: Patient is living at nursing facility.  Discharge planning back to nursing facility when medically stable.  PT/OT consulted.         DVT prophylaxis:SCDs Start: 02/26/23 1632 apixaban  (ELIQUIS ) tablet 5 mg     Code Status: Full Code  Family Communication: None at bedside  Patient status:Inpatient  Patient is from :SNF  Anticipated discharge to:SnF  Estimated DC date: Tomorrow   Consultants: Cardiology,pccm, GI  Procedures:EGD  Antimicrobials:  Anti-infectives (From admission, onward)    None       Subjective: Patient seen and the bedside today.  Lying comfortably on bed.  Monitor shows A-fib rhythm but with controlled heart rate in the range of 80-90.  No new complaints  Objective: Vitals:   03/04/23 0005 03/04/23 0415 03/04/23 0624 03/04/23 1154  BP: 106/81 104/72  101/89  Pulse: (!) 123 (!) 120  (!) 109  Resp: 19 20    Temp: 98 F (36.7 C) 97.6 F (36.4 C)  97.8 F (36.6 C)  TempSrc: Oral Axillary  Oral   SpO2: 98% 99%  94%  Weight:   69.8 kg   Height:        Intake/Output Summary (Last 24 hours) at 03/04/2023 1257 Last data filed at 03/04/2023 0427 Gross per 24 hour  Intake 363 ml  Output 950 ml  Net -587 ml   Filed Weights   03/02/23 0500 03/03/23 0313 03/04/23 0624  Weight: 69.5 kg 71.1 kg 69.8 kg    Examination: General exam: Overall comfortable, not in distress, lying in bed HEENT: PERRL Respiratory system:  no wheezes or crackles  Cardiovascular system: Irregularly irregular rhythm.  Gastrointestinal system: Abdomen is nondistended, soft and nontender. Central nervous system: Alert and oriented Extremities: No edema, no clubbing ,no cyanosis Skin: Scattered bruises, no ulcers,no icterus     Data Reviewed: I have personally reviewed following labs and imaging studies  CBC: Recent Labs  Lab 02/26/23 1551 02/26/23 1843 02/28/23 0639 02/28/23 0642 03/01/23 0835 03/02/23 0337 03/03/23 0218 03/04/23 0223  WBC 4.0   < > 3.6*  --  3.7* 3.2* 2.5* 2.6*  NEUTROABS 3.0  --   --   --   --   --   --   --   HGB 8.6*   < > 9.8* 9.8* 9.8* 9.0* 8.6* 8.5*  HCT 27.7*   < > 30.5* 29.8* 30.2* 27.4* 26.5* 26.0*  MCV 91.7   < > 88.9  --  89.9 87.5 87.5 88.4  PLT 130*   < > 150  --  177 155 140* 144*   < > = values in this interval not displayed.   Basic Metabolic Panel: Recent Labs  Lab 02/26/23 1843 02/26/23 2326 02/27/23 0252 02/28/23 9360 03/01/23 0835 03/02/23 0337 03/03/23 0218 03/04/23 0223  NA  --    < > 137 138 141 139 136 133*  K  --    < > 4.9 4.3 4.1 4.0 3.6 4.1  CL  --    < > 107 102 103 103 102 100  CO2  --    < > 21* 27 24 27 26 25   GLUCOSE  --    < > 71 104* 73 203* 122* 143*  BUN  --    < > 48* 43* 39* 36* 28* 28*  CREATININE 2.69*   < > 2.60* 2.04* 1.85* 1.81* 1.66* 1.98*  CALCIUM   --    < > 9.2 8.6* 8.7* 8.5* 8.5* 8.3*  MG 2.6*  --  2.6* 2.4 2.4 2.4  --   --   PHOS  --   --  4.9* 4.7* 3.9 3.1 3.3  --    < > = values in this interval not displayed.      Recent Results (from the past 240 hours)  MRSA Next Gen by PCR, Nasal     Status: None   Collection Time: 02/26/23  7:02 PM   Specimen: Urine, Clean Catch; Nasal Swab  Result Value Ref Range Status   MRSA by PCR Next Gen NOT DETECTED NOT DETECTED Final    Comment: (NOTE) The GeneXpert MRSA Assay (FDA approved for NASAL specimens only), is one component of a comprehensive MRSA colonization surveillance program. It is not intended to diagnose MRSA infection nor to guide or monitor treatment for MRSA infections. Test performance is  not FDA approved in patients less than 42 years old. Performed at [facility], [address] [place], [place] [address]      Radiology Studies: No results found.  Scheduled Meds:  apixaban   5 mg Oral BID   atorvastatin   20 mg Oral Daily   Chlorhexidine  Gluconate Cloth  6 each Topical Daily   diltiazem   180 mg Oral Daily   empagliflozin   25 mg Oral Daily   feeding supplement  1 Container Oral TID BM   insulin  aspart  0-5 Units Subcutaneous QHS   insulin  aspart  0-9 Units Subcutaneous TID WC   levETIRAcetam   500 mg Oral BID   mouth rinse  15 mL Mouth Rinse 4 times per day   pantoprazole   40 mg Oral BID AC   sodium chloride  flush  3 mL Intravenous Q12H   Continuous Infusions:  ferumoxytol  (FERAHEME) 510 mg in sodium chloride  0.9 % 100 mL IVPB Stopped (02/26/23 2038)     LOS: 6 days   Ivonne Mustache, MD [facility] P1/13/2025, 12:57 PM

## 2023-03-04 NOTE — Plan of Care (Signed)
   Problem: Activity: Goal: Risk for activity intolerance will decrease Outcome: Progressing   Problem: Nutrition: Goal: Adequate nutrition will be maintained Outcome: Progressing   Problem: Coping: Goal: Level of anxiety will decrease Outcome: Progressing   Problem: Elimination: Goal: Will not experience complications related to bowel motility Outcome: Progressing Goal: Will not experience complications related to urinary retention Outcome: Progressing   Problem: Pain Management: Goal: General experience of comfort will improve Outcome: Progressing

## 2023-03-04 NOTE — Plan of Care (Signed)
  Problem: Education: Goal: Ability to describe self-care measures that may prevent or decrease complications (Diabetes Survival Skills Education) will improve Outcome: Progressing   Problem: Coping: Goal: Ability to adjust to condition or change in health will improve Outcome: Progressing   Problem: Fluid Volume: Goal: Ability to maintain a balanced intake and output will improve Outcome: Progressing   Problem: Health Behavior/Discharge Planning: Goal: Ability to identify and utilize available resources and services will improve Outcome: Progressing Goal: Ability to manage health-related needs will improve Outcome: Progressing   Problem: Metabolic: Goal: Ability to maintain appropriate glucose levels will improve Outcome: Progressing   Problem: Nutritional: Goal: Maintenance of adequate nutrition will improve Outcome: Progressing Goal: Progress toward achieving an optimal weight will improve Outcome: Progressing   Problem: Skin Integrity: Goal: Risk for impaired skin integrity will decrease Outcome: Progressing   Problem: Tissue Perfusion: Goal: Adequacy of tissue perfusion will improve Outcome: Progressing   Problem: Education: Goal: Knowledge of General Education information will improve Description: Including pain rating scale, medication(s)/side effects and non-pharmacologic comfort measures Outcome: Progressing   Problem: Health Behavior/Discharge Planning: Goal: Ability to manage health-related needs will improve Outcome: Progressing   Problem: Clinical Measurements: Goal: Ability to maintain clinical measurements within normal limits will improve Outcome: Progressing Goal: Will remain free from infection Outcome: Progressing Goal: Diagnostic test results will improve Outcome: Progressing Goal: Respiratory complications will improve Outcome: Progressing Goal: Cardiovascular complication will be avoided Outcome: Progressing   Problem: Activity: Goal:  Risk for activity intolerance will decrease Outcome: Progressing   Problem: Nutrition: Goal: Adequate nutrition will be maintained Outcome: Progressing   Problem: Coping: Goal: Level of anxiety will decrease Outcome: Progressing   Problem: Elimination: Goal: Will not experience complications related to bowel motility Outcome: Progressing Goal: Will not experience complications related to urinary retention Outcome: Progressing   Problem: Pain Management: Goal: General experience of comfort will improve Outcome: Progressing   Problem: Safety: Goal: Ability to remain free from injury will improve Outcome: Progressing   Problem: Skin Integrity: Goal: Risk for impaired skin integrity will decrease Outcome: Progressing   Problem: Education: Goal: Understanding of CV disease, CV risk reduction, and recovery process will improve Outcome: Progressing   Problem: Activity: Goal: Ability to return to baseline activity level will improve Outcome: Progressing   Problem: Cardiovascular: Goal: Ability to achieve and maintain adequate cardiovascular perfusion will improve Outcome: Progressing Goal: Vascular access site(s) Level 0-1 will be maintained Outcome: Progressing   Problem: Health Behavior/Discharge Planning: Goal: Ability to safely manage health-related needs after discharge will improve Outcome: Progressing

## 2023-03-04 NOTE — Consult Note (Signed)
 [facility] Liaison Consult Note   03/04/2023  Brian [name] [birth year] [id number]  Insurance: [employer]   Primary Care Provider: Duanne Butler DASEN, MD with [facility], this provider is listed for the transition of care follow up appointments  and [facility] calls   [facility] Liaison reviewed patient for readmission risk.  Patient from SNF    The patient was screened for day readmission hospitalization with noted extreme high risk score for unplanned readmission risk 2 hospital admissions in 6 months.  The patient was assessed for potential Community Care Coordination service needs for post hospital transition for care coordination. Review of patient's electronic medical record reveals patient is admitted with symptomatic bradycardia, progress notes reviewed for admission   Plan: [facility] Liaison will continue to follow progress and disposition to asess for post hospital community care coordination/management needs.  Referral request for community care coordination: currently for returning to SNF per recommendations.   [facility] Community Care, Population Health does not replace or interfere with any arrangements made by the Inpatient Transition of Care team.   For questions contact:   Richerd Fish, RN, BSN, CCM [place]  [facility] Liaison Direct Dial: [phone] or secure chat Email: [email or web address]

## 2023-03-04 NOTE — Progress Notes (Signed)
 Physical Therapy Treatment Patient Details Name: Brian [name] MRN: [id number] DOB: 1955-04-04 Today's Date: 03/04/2023   History of Present Illness 68 y.o. male admitted from [facility] 02/26/23 with lethargy and AMS after ingesting unknown pills. Workup revealed aflutter, CHF; temporary pacer placed 1/7-1/8. Coffee ground emesis 1/8; NGT placed 1/8. Plan EGD 1/10. PMH includes afib on Eliquis , HTN, CHF, HLD, CKD 3b, seizure disorder, R MCA CVA (residual L hemiparesis), squamous cell lung CA, depression.    PT Comments  Pt making steady progress with mobility. Focused this session on balance activities with directional stepping and decreasing UE support. Ambulation focused on quality vs distance since pt had amb prior with mobility team. Pt motivated to work toward returning to independence.  Patient will benefit from continued inpatient follow up therapy, <3 hours/day.    If plan is discharge home, recommend the following: A little help with walking and/or transfers;A little help with bathing/dressing/bathroom;Assistance with cooking/housework;Assist for transportation;Help with stairs or ramp for entrance   Can travel by private vehicle     Yes  Equipment Recommendations  None recommended by PT    Recommendations for Other Services       Precautions / Restrictions Precautions Precautions: Fall;Other (comment) Precaution Comments: watch HR; h/o R MCA CVA with residual L-side weakness Restrictions Weight Bearing Restrictions Per Provider Order: No RLE Weight Bearing Per Provider Order: Weight bearing as tolerated LLE Weight Bearing Per Provider Order: Weight bearing as tolerated     Mobility  Bed Mobility               General bed mobility comments: Pt up in chair    Transfers Overall transfer level: Needs assistance Equipment used: Rolling walker (2 wheels) Transfers: Sit to/from Stand Sit to Stand: Contact guard assist           General transfer comment:  Assist for balance and lines    Ambulation/Gait Ambulation/Gait assistance: Contact guard assist, Min assist Gait Distance (Feet): 100 Feet Assistive device: Rolling walker (2 wheels), 1 person hand held assist Gait Pattern/deviations: Step-through pattern, Decreased stride length, Trunk flexed Gait velocity: decr Gait velocity interpretation: <1.8 ft/sec, indicate of risk for recurrent falls   General Gait Details: Assist for safety and lines when amb with walker. Pt with slight circumduction of LLE with swing.  Did short distance amb in room with HHA to work on balance. Pt required min assist for HHA.   Stairs             Wheelchair Mobility     Tilt Bed    Modified Rankin (Stroke Patients Only)       Balance Overall balance assessment: Needs assistance Sitting-balance support: No upper extremity supported, Feet supported, Feet unsupported Sitting balance-Leahy Scale: Good     Standing balance support: Single extremity supported, Bilateral upper extremity supported, During functional activity, Reliant on assistive device for balance Standing balance-Leahy Scale: Poor Standing balance comment: Needs UE assist for static standing             High level balance activites: Side stepping, Backward walking High Level Balance Comments: Required min assist for side stepping and backwards walking            Cognition Arousal: Alert Behavior During Therapy: WFL for tasks assessed/performed Overall Cognitive Status: Within Functional Limits for tasks assessed  Exercises Other Exercises Other Exercises: Step back with pivot/trunk rotation x 6 to lt and 6 to rt. Used elevated bed rail for UE support. Other Exercises: Side step x 12 Other Exercises: Backward step x 10    General Comments General comments (skin integrity, edema, etc.): VSS on RA      Pertinent Vitals/Pain      Home Living                           Prior Function            PT Goals (current goals can now be found in the care plan section) Progress towards PT goals: Progressing toward goals    Frequency    Min 1X/week      PT Plan      Co-evaluation              AM-PAC PT 6 Clicks Mobility   Outcome Measure  Help needed turning from your back to your side while in a flat bed without using bedrails?: None Help needed moving from lying on your back to sitting on the side of a flat bed without using bedrails?: A Little Help needed moving to and from a bed to a chair (including a wheelchair)?: A Little Help needed standing up from a chair using your arms (e.g., wheelchair or bedside chair)?: A Little Help needed to walk in hospital room?: A Little Help needed climbing 3-5 steps with a railing? : A Little 6 Click Score: 19    End of Session   Activity Tolerance: Patient tolerated treatment well Patient left: in chair;with call bell/phone within reach;with chair alarm set   PT Visit Diagnosis: Other abnormalities of gait and mobility (R26.89)     Time: 8550-8496 PT Time Calculation (min) (ACUTE ONLY): 14 min  Charges:    $Gait Training: 8-22 mins PT General Charges $$ ACUTE PT VISIT: 1 Visit                     [facility] PT Acute Rehabilitation Services Office [phone]    Rodgers ORN [facility] 03/04/2023, 3:16 PM

## 2023-03-04 NOTE — Progress Notes (Signed)
 OT Cancellation Note  Patient Details Name: Brian [name] MRN: [id number] DOB: [birth year]   Cancelled Treatment:    Reason Eval/Treat Not Completed: Other (acknowledge Imm DC order, Pt is already on caseload and <3 hour rehab recommended. OT will see as schedule allows - but plan remains appropriate)  Leita JINNY Odea 03/04/2023, 1:16 PM  Leita DEL OTR/L Acute Rehabilitation Services Office: [phone]

## 2023-03-04 NOTE — Progress Notes (Signed)
 Speech Language Pathology Treatment: Dysphagia  Patient Details Name: Brian [name] MRN: [id number] DOB: 1955-05-06 Today's Date: 03/04/2023 Time: 8286-8278 SLP Time Calculation (min) (ACUTE ONLY): 8 min  Assessment / Plan / Recommendation Clinical Impression  Pt observed with trials from his dinner tray consisting of mechanical soft solids and thin liquids. He intermittently uses an immediate throat clear, which he states has become habitual after OP SLP recommendations. Pt demonstrates good carryover of recommended compensatory strategies and his swallowing appears to be at his baseline. Recommend continuing current diet with intermittent supervision. Provided education regarding increased caution with swallowing in times of acute deconditioning with pt demonstrating understanding. SLP will s/o at this time.    HPI HPI: Brian [name] is a 68 yo male presenting to ED 1/7 after being found with AMS after episode of emesis with half dissolved pill in emesis. Underwent pacemaker placement 1/7. Worked with OP SLP to target dysphagia goals following MBS 10/26/21 with mild-moderate pharyngeal dysphagia suspected to be related to post-radiation changes with edematous tissue particularly noted at the epiglottis and arytenoids. He did aspirate thin liquids but cleared them when cued to perform strong throat clearance and was recommended a mechanical soft diet with thin liquids. Per that SLP, given that penetration is likely with thicker liquids (and aspiration possible across a meal), there is increased residue, and pt has remained stable from a pulmonary standpoint, recommend continuing with thin liquids in the setting of thorough oral care and implementation of a cough or more effortful throat clear post-swallow. PMH includes A-fib on Eliquis , systolic and diastolic HF, HTN, HLD, CKD, seizure disorder, R MCA CVA with residual L hemiparesis, SCC of tongue      SLP Plan  All goals met       Recommendations for follow up therapy are one component of a multi-disciplinary discharge planning process, led by the attending physician.  Recommendations may be updated based on patient status, additional functional criteria and insurance authorization.    Recommendations  Diet recommendations: Dysphagia 3 (mechanical soft);Thin liquid Liquids provided via: Cup;Straw Medication Administration: Whole meds with liquid Supervision: Patient able to self feed;Intermittent supervision to cue for compensatory strategies Compensations: Minimize environmental distractions;Slow rate;Small sips/bites;Clear throat after each swallow;Follow solids with liquid Postural Changes and/or Swallow Maneuvers: Seated upright 90 degrees                  Oral care BID   Frequent or constant Supervision/Assistance Dysphagia, unspecified (R13.10)     All goals met     Damien Blumenthal, M.A., CF-SLP Speech Language Pathology, Acute Rehabilitation Services  Secure Chat preferred [phone]   03/04/2023, 5:24 PM

## 2023-03-04 NOTE — Progress Notes (Signed)
 Progress Note  Patient Name: Brian [name] Date of Encounter: 03/04/2023  Primary Cardiologist:   Lynwood Schilling, MD   Subjective   He denies any pain or palpitations today.  No SOB.  Ambulating in room.   Inpatient Medications    Scheduled Meds:  apixaban   5 mg Oral BID   atorvastatin   20 mg Oral Daily   Chlorhexidine  Gluconate Cloth  6 each Topical Daily   diltiazem   30 mg Oral Q6H   empagliflozin   25 mg Oral Daily   feeding supplement  1 Container Oral TID BM   insulin  aspart  0-5 Units Subcutaneous QHS   insulin  aspart  0-9 Units Subcutaneous TID WC   levETIRAcetam   500 mg Oral BID   mouth rinse  15 mL Mouth Rinse 4 times per day   pantoprazole   40 mg Oral BID AC   sodium chloride  flush  3 mL Intravenous Q12H   Continuous Infusions:  ferumoxytol  (FERAHEME) 510 mg in sodium chloride  0.9 % 100 mL IVPB Stopped (02/26/23 2038)   PRN Meds: hydrALAZINE , metoprolol  tartrate, ondansetron  (ZOFRAN ) IV, mouth rinse, sodium chloride  flush   Vital Signs    Vitals:   03/03/23 1943 03/04/23 0005 03/04/23 0415 03/04/23 0624  BP: 120/88 106/81 104/72   Pulse: (!) 131 (!) 123 (!) 120   Resp: 20 19 20    Temp: 98.1 F (36.7 C) 98 F (36.7 C) 97.6 F (36.4 C)   TempSrc: Oral Oral Axillary   SpO2: 95% 98% 99%   Weight:    69.8 kg  Height:        Intake/Output Summary (Last 24 hours) at 03/04/2023 0714 Last data filed at 03/04/2023 0427 Gross per 24 hour  Intake 843 ml  Output 950 ml  Net -107 ml   Filed Weights   03/02/23 0500 03/03/23 0313 03/04/23 0624  Weight: 69.5 kg 71.1 kg 69.8 kg    Telemetry    Atrial fib with mildly elevated ventricular rate- Personally Reviewed  ECG    NA - Personally Reviewed  Physical Exam   GEN: No  acute distress.   Neck: No  JVD Cardiac: Irregular RR, no murmurs, rubs, or gallops.  Respiratory: Clear   to auscultation bilaterally. GI: Soft, nontender, non-distended, normal bowel sounds  MS:  No edema; No  deformity. Neuro:   Nonfocal  Psych: Oriented and appropriate     Labs    Chemistry Recent Labs  Lab 02/26/23 1551 02/26/23 1843 03/02/23 0337 03/03/23 0218 03/04/23 0223  NA 134*   < > 139 136 133*  K 5.6*   < > 4.0 3.6 4.1  CL 105   < > 103 102 100  CO2 20*   < > 27 26 25   GLUCOSE 114*   < > 203* 122* 143*  BUN 52*   < > 36* 28* 28*  CREATININE 2.84*   < > 1.81* 1.66* 1.98*  CALCIUM  8.3*   < > 8.5* 8.5* 8.3*  PROT 5.3*  --   --   --   --   ALBUMIN 2.9*  --  2.6* 2.7*  --   AST 13*  --   --   --   --   ALT 18  --   --   --   --   ALKPHOS 60  --   --   --   --   BILITOT 0.6  --   --   --   --   GFRNONAA 23*   < >  40* 44* 36*  ANIONGAP 9   < > 9 8 8    < > = values in this interval not displayed.     Hematology Recent Labs  Lab 03/02/23 0337 03/03/23 0218 03/04/23 0223  WBC 3.2* 2.5* 2.6*  RBC 3.13* 3.03* 2.94*  HGB 9.0* 8.6* 8.5*  HCT 27.4* 26.5* 26.0*  MCV 87.5 87.5 88.4  MCH 28.8 28.4 28.9  MCHC 32.8 32.5 32.7  RDW 16.1* 15.9* 15.9*  PLT 155 140* 144*    Cardiac EnzymesNo results for input(s): TROPONINI in the last 168 hours. No results for input(s): TROPIPOC in the last 168 hours.   BNP Recent Labs  Lab 02/26/23 1853  BNP 303.0*     DDimer  Recent Labs  Lab 02/26/23 1551  DDIMER 0.86*     Radiology    No results found.  Cardiac Studies   Echo  1. Cannot fully assess wall motion. Left ventricular ejection fraction,  by estimation, is 60 to 65%. The left ventricle has normal function. There  is moderate concentric left ventricular hypertrophy. Left ventricular  diastolic parameters are  indeterminate.   2. Right ventricular systolic function is mildly reduced. The right  ventricular size is mildly enlarged. Tricuspid regurgitation signal is  inadequate for assessing PA pressure.   3. Left atrial size was moderately dilated.   4. Right atrial size was mildly dilated.   5. The mitral valve was not well visualized. No evidence of  mitral valve  regurgitation.   6. The aortic valve was not well visualized. Aortic valve regurgitation  is not visualized.   7. The inferior vena cava is dilated in size with >50% respiratory  variability, suggesting right atrial pressure of 8 mmHg.   Patient Profile     68 y.o. male history of AF, prior strokes with hx of conduction disease.   Assessment & Plan   CHB:   Resolved.  He had temporary venous pacer for a while.     Atrial flutter:   Resumed Eliquis .  Rate was increased yesterday.  I stopped Coreg  and started Cardizem .    I will increase dose and change to CD this morning to see if we get better HR control today.    He has chronic long standing fib so DCCV is likely not to be successful long term.      GI bleed:  Hgb is relatviely the same today.    Chronic systolic HF:    Ejection fraction improved.    No volume overload at present.    HTN:    Blood pressure is being controlled in the context of treating the heart rate.   AKI:    Creatinine remains elevated.  Avoid ARB/ARNi  For questions or updates, please contact [facility] Please consult [email or web address] for contact info under Cardiology/STEMI.   Signed, Lynwood Schilling, MD  03/04/2023, 7:14 AM

## 2023-03-04 NOTE — TOC Progression Note (Addendum)
 Transition of Care [facility]) - Progression Note    Patient Details  Name: Brian [name] MRN: [id number] Date of Birth: October 28, 1955  [facility]) CM/SW Contact  Lauraine FORBES Saa, [name] Phone Number: 03/04/2023, 12:22 PM  Clinical Narrative:     12:22 PM CSW introduced herself and role to patient at bedside. CSW inquired about return to [facility] upon discharge. Patient expressed interest in returning to [facility]. CSW confirmed with [name] that patient could return to SNF, which CSW relayed to patient. CSW inquired about transportation. Patient expressed interest in daughter, [name], providing transportation for discharge. CSW informed patient of ambulance transportation option (possible copay to be billed upon service) but continued interest in [name] providing transportation. CSW offered to call [name] to coordinate transportation. Patient accepted this offer. CSW informed patient that physical therapy confirmed patient capability of utilizing private vehicle for discharge transportation, which patient expressed understanding of.  12:34 PM CSW called patient's daughter, [name], to inform her of discharge plans. [name] expressed understanding of plans and accepted patient request of providing transportation. [name] requested CSW to inform patient that she has not been able to visit due to children being out of school for weather reasons. CSW accepted request, SNF requested new insurance authorization.  3:39 PM CSW submitted insurance authorization (ID [id number]) for SNF. SNF informed CSW that patient cannot admit until authorization has been accepted.  4:05 PM CSW returned to patient's bedside to relay that patient's daughter, [name], has been unable to visit due to school closures but is able to provide transportation for discharge (expected tomorrow). Patient expressed understanding of this. CSW followed up with patient on SDOH needs (transportation and  social connections). Patient declined CSW offer of resources. Patient informed CSW that [name] and [name] provide transportation to medical and non-medical appointments.  Expected Discharge Plan: Skilled Nursing Facility Barriers to Discharge: Continued Medical Work up  Expected Discharge Plan and Services In-house Referral: Clinical Social Work   Post Acute Care Choice: Skilled Nursing Facility Living arrangements for the past 2 months: Skilled Nursing Facility                                       Social Determinants of Health (SDOH) Interventions SDOH Screenings   Food Insecurity: No Food Insecurity (03/01/2023)  Housing: Low Risk  (03/01/2023)  Transportation Needs: Unmet Transportation Needs (03/01/2023)  Utilities: Not At Risk (03/01/2023)  Alcohol  Screen: Low Risk  (11/14/2021)  Depression (PHQ2-9): Low Risk  (04/05/2022)  Financial Resource Strain: Low Risk  (04/05/2022)  Physical Activity: Inactive (04/05/2022)  Social Connections: Socially Isolated (03/01/2023)  Stress: No Stress Concern Present (04/05/2022)  Tobacco Use: Low Risk  (03/01/2023)    Readmission Risk Interventions    11/08/2021    9:46 AM  Readmission Risk Prevention Plan  Transportation Screening Complete  PCP or Specialist Appt within 3-5 Days Complete  HRI or Home Care Consult Complete  Social Work Consult for Recovery Care Planning/Counseling Complete  Palliative Care Screening Not Applicable  Medication Review Oceanographer) Complete

## 2023-03-05 DIAGNOSIS — R001 Bradycardia, unspecified: Secondary | ICD-10-CM | POA: Diagnosis not present

## 2023-03-05 LAB — BASIC METABOLIC PANEL
Anion gap: 8 (ref 5–15)
BUN: 26 mg/dL — ABNORMAL HIGH (ref 8–23)
CO2: 24 mmol/L (ref 22–32)
Calcium: 8.5 mg/dL — ABNORMAL LOW (ref 8.9–10.3)
Chloride: 103 mmol/L (ref 98–111)
Creatinine, Ser: 1.73 mg/dL — ABNORMAL HIGH (ref 0.61–1.24)
GFR, Estimated: 42 mL/min — ABNORMAL LOW (ref >60)
Glucose, Bld: 133 mg/dL — ABNORMAL HIGH (ref 70–99)
Potassium: 4 mmol/L (ref 3.5–5.1)
Sodium: 135 mmol/L (ref 135–145)

## 2023-03-05 LAB — GLUCOSE, CAPILLARY
Glucose-Capillary: 109 mg/dL — ABNORMAL HIGH (ref 70–99)
Glucose-Capillary: 130 mg/dL — ABNORMAL HIGH (ref 70–99)
Glucose-Capillary: 133 mg/dL — ABNORMAL HIGH (ref 70–99)
Glucose-Capillary: 96 mg/dL (ref 70–99)
Glucose-Capillary: 180 mg/dL — ABNORMAL HIGH (ref 70–99)

## 2023-03-05 LAB — SURGICAL PATHOLOGY

## 2023-03-05 MED ORDER — METOPROLOL TARTRATE 12.5 MG HALF TABLET
12.5000 mg | ORAL_TABLET | Freq: Two times a day (BID) | ORAL | Status: DC
  Administered 2023-03-05 – 2023-03-06 (×3): 12.5 mg via ORAL
  Filled 2023-03-05 (×3): qty 1

## 2023-03-05 NOTE — Progress Notes (Signed)
 Mobility Specialist Progress Note:    03/05/23 0900  Mobility  Activity Ambulated with assistance in hallway  Level of Assistance Standby assist, set-up cues, supervision of patient - no hands on  Assistive Device Front wheel walker  Distance Ambulated (ft) 250 ft  RLE Weight Bearing Per Provider Order WBAT  LLE Weight Bearing Per Provider Order WBAT  Activity Response Tolerated well  Mobility Referral Yes  Mobility visit 1 Mobility  Mobility Specialist Start Time (ACUTE ONLY) 0844  Mobility Specialist Stop Time (ACUTE ONLY) 0856  Mobility Specialist Time Calculation (min) (ACUTE ONLY) 12 min   Received pt in bed having no complaints and agreeable to mobility. Pt was asymptomatic throughout ambulation and returned to room w/o fault. Left in chair w/ call bell in reach and all needs met.   D'Vante Nicholaus Mobility Specialist Please contact via Special Educational Needs Teacher or Rehab office at [phone]

## 2023-03-05 NOTE — Progress Notes (Signed)
 Progress Note  Patient Name: Brian [name] Date of Encounter: 03/05/2023  Primary Cardiologist:   Lynwood Schilling, MD   Subjective   Ambulated in hallway. No pain. No SOB.   Inpatient Medications    Scheduled Meds:  apixaban   5 mg Oral BID   atorvastatin   20 mg Oral Daily   Chlorhexidine  Gluconate Cloth  6 each Topical Daily   diltiazem   180 mg Oral Daily   empagliflozin   25 mg Oral Daily   feeding supplement  1 Container Oral TID BM   insulin  aspart  0-5 Units Subcutaneous QHS   insulin  aspart  0-9 Units Subcutaneous TID WC   levETIRAcetam   500 mg Oral BID   mouth rinse  15 mL Mouth Rinse 4 times per day   pantoprazole   40 mg Oral BID AC   sodium chloride  flush  3 mL Intravenous Q12H   Continuous Infusions:  ferumoxytol  (FERAHEME) 510 mg in sodium chloride  0.9 % 100 mL IVPB Stopped (02/26/23 2038)   PRN Meds: hydrALAZINE , metoprolol  tartrate, ondansetron  (ZOFRAN ) IV, mouth rinse, sodium chloride  flush   Vital Signs    Vitals:   03/04/23 1154 03/04/23 2005 03/05/23 0003 03/05/23 0635  BP: 101/89 (!) 129/91 (!) 138/96 (!) 139/91  Pulse: (!) 109 97  (!) 113  Resp:  18 20 (!) 22  Temp: 97.8 F (36.6 C) 97.6 F (36.4 C) 98 F (36.7 C) 97.8 F (36.6 C)  TempSrc: Oral Oral Oral Oral  SpO2: 94% 98%    Weight:    70.6 kg  Height:        Intake/Output Summary (Last 24 hours) at 03/05/2023 0726 Last data filed at 03/05/2023 0414 Gross per 24 hour  Intake --  Output 2050 ml  Net -2050 ml   Filed Weights   03/03/23 0313 03/04/23 0624 03/05/23 0635  Weight: 71.1 kg 69.8 kg 70.6 kg    Telemetry    Atrial fib with rapid ventricular rate - Personally Reviewed  ECG    NA - Personally Reviewed  Physical Exam   GEN: No  acute distress.   Neck: No  JVD Cardiac: Irregular RR, no murmurs, rubs, or gallops.  Respiratory: Clear   to auscultation bilaterally. GI: Soft, nontender, non-distended, normal bowel sounds  MS:  No edema; No deformity. Neuro:    Nonfocal  Psych: Oriented and appropriate      Labs    Chemistry Recent Labs  Lab 02/26/23 1551 02/26/23 1843 03/02/23 0337 03/03/23 0218 03/04/23 0223 03/05/23 0215  NA 134*   < > 139 136 133* 135  K 5.6*   < > 4.0 3.6 4.1 4.0  CL 105   < > 103 102 100 103  CO2 20*   < > 27 26 25 24   GLUCOSE 114*   < > 203* 122* 143* 133*  BUN 52*   < > 36* 28* 28* 26*  CREATININE 2.84*   < > 1.81* 1.66* 1.98* 1.73*  CALCIUM  8.3*   < > 8.5* 8.5* 8.3* 8.5*  PROT 5.3*  --   --   --   --   --   ALBUMIN 2.9*  --  2.6* 2.7*  --   --   AST 13*  --   --   --   --   --   ALT 18  --   --   --   --   --   ALKPHOS 60  --   --   --   --   --  BILITOT 0.6  --   --   --   --   --   GFRNONAA 23*   < > 40* 44* 36* 42*  ANIONGAP 9   < > 9 8 8 8    < > = values in this interval not displayed.     Hematology Recent Labs  Lab 03/02/23 0337 03/03/23 0218 03/04/23 0223  WBC 3.2* 2.5* 2.6*  RBC 3.13* 3.03* 2.94*  HGB 9.0* 8.6* 8.5*  HCT 27.4* 26.5* 26.0*  MCV 87.5 87.5 88.4  MCH 28.8 28.4 28.9  MCHC 32.8 32.5 32.7  RDW 16.1* 15.9* 15.9*  PLT 155 140* 144*    Cardiac EnzymesNo results for input(s): TROPONINI in the last 168 hours. No results for input(s): TROPIPOC in the last 168 hours.   BNP Recent Labs  Lab 02/26/23 1853  BNP 303.0*     DDimer  Recent Labs  Lab 02/26/23 1551  DDIMER 0.86*     Radiology    No results found.  Cardiac Studies   Echo  1. Cannot fully assess wall motion. Left ventricular ejection fraction,  by estimation, is 60 to 65%. The left ventricle has normal function. There  is moderate concentric left ventricular hypertrophy. Left ventricular  diastolic parameters are  indeterminate.   2. Right ventricular systolic function is mildly reduced. The right  ventricular size is mildly enlarged. Tricuspid regurgitation signal is  inadequate for assessing PA pressure.   3. Left atrial size was moderately dilated.   4. Right atrial size was mildly  dilated.   5. The mitral valve was not well visualized. No evidence of mitral valve  regurgitation.   6. The aortic valve was not well visualized. Aortic valve regurgitation  is not visualized.   7. The inferior vena cava is dilated in size with >50% respiratory  variability, suggesting right atrial pressure of 8 mmHg.   Patient Profile     68 y.o. male history of AF, prior strokes with hx of conduction disease.   Assessment & Plan   CHB:   Resolved.    Atrial flutter:   Resumed Eliquis .  Rate was increased yesterday.   Restart low dose beta blocker today.  No bradyarrhythmias noted.  Will follow closely with admitting diagnosis of HB.  Note that this HB was related to other acute issues ongoing at that time.   GI bleed:   Hgb followed per primary team.     HTN:    This will be managed in the context of treating the HR.    AKI:    Creat relatively stable.  Avoid ARB/ARNi  For questions or updates, please contact [facility] Please consult [email or web address] for contact info under Cardiology/STEMI.   Signed, Lynwood Schilling, MD  03/05/2023, 7:26 AM

## 2023-03-05 NOTE — Plan of Care (Signed)
  Problem: Education: Goal: Ability to describe self-care measures that may prevent or decrease complications (Diabetes Survival Skills Education) will improve Outcome: Progressing   Problem: Coping: Goal: Ability to adjust to condition or change in health will improve Outcome: Progressing   Problem: Fluid Volume: Goal: Ability to maintain a balanced intake and output will improve Outcome: Progressing   Problem: Health Behavior/Discharge Planning: Goal: Ability to identify and utilize available resources and services will improve Outcome: Progressing Goal: Ability to manage health-related needs will improve Outcome: Progressing   Problem: Metabolic: Goal: Ability to maintain appropriate glucose levels will improve Outcome: Progressing   Problem: Nutritional: Goal: Maintenance of adequate nutrition will improve Outcome: Progressing Goal: Progress toward achieving an optimal weight will improve Outcome: Progressing   Problem: Skin Integrity: Goal: Risk for impaired skin integrity will decrease Outcome: Progressing   Problem: Tissue Perfusion: Goal: Adequacy of tissue perfusion will improve Outcome: Progressing   Problem: Education: Goal: Knowledge of General Education information will improve Description: Including pain rating scale, medication(s)/side effects and non-pharmacologic comfort measures Outcome: Progressing   Problem: Health Behavior/Discharge Planning: Goal: Ability to manage health-related needs will improve Outcome: Progressing   Problem: Clinical Measurements: Goal: Ability to maintain clinical measurements within normal limits will improve Outcome: Progressing Goal: Will remain free from infection Outcome: Progressing Goal: Diagnostic test results will improve Outcome: Progressing Goal: Respiratory complications will improve Outcome: Progressing Goal: Cardiovascular complication will be avoided Outcome: Progressing   Problem: Activity: Goal:  Risk for activity intolerance will decrease Outcome: Progressing   Problem: Nutrition: Goal: Adequate nutrition will be maintained Outcome: Progressing   Problem: Coping: Goal: Level of anxiety will decrease Outcome: Progressing   Problem: Elimination: Goal: Will not experience complications related to bowel motility Outcome: Progressing Goal: Will not experience complications related to urinary retention Outcome: Progressing   Problem: Pain Management: Goal: General experience of comfort will improve Outcome: Progressing   Problem: Safety: Goal: Ability to remain free from injury will improve Outcome: Progressing   Problem: Skin Integrity: Goal: Risk for impaired skin integrity will decrease Outcome: Progressing   Problem: Education: Goal: Understanding of CV disease, CV risk reduction, and recovery process will improve Outcome: Progressing   Problem: Activity: Goal: Ability to return to baseline activity level will improve Outcome: Progressing   Problem: Cardiovascular: Goal: Ability to achieve and maintain adequate cardiovascular perfusion will improve Outcome: Progressing Goal: Vascular access site(s) Level 0-1 will be maintained Outcome: Progressing   Problem: Health Behavior/Discharge Planning: Goal: Ability to safely manage health-related needs after discharge will improve Outcome: Progressing

## 2023-03-05 NOTE — TOC Progression Note (Addendum)
 Transition of Care [facility]) - Progression Note    Patient Details  Name: Brian [name] MRN: [id number] Date of Birth: [birth year]  [facility]) CM/SW Contact  Lauraine FORBES Saa, [name] Phone Number: 03/05/2023, 2:45 PM  Clinical Narrative:     2:45 PM Insurance authorization for SNF remains pending.  Expected Discharge Plan: Skilled Nursing Facility Barriers to Discharge: Continued Medical Work up  Expected Discharge Plan and Services In-house Referral: Clinical Social Work   Post Acute Care Choice: Skilled Nursing Facility Living arrangements for the past 2 months: Skilled Nursing Facility                                       Social Determinants of Health (SDOH) Interventions SDOH Screenings   Food Insecurity: No Food Insecurity (03/01/2023)  Housing: Low Risk  (03/01/2023)  Transportation Needs: Unmet Transportation Needs (03/01/2023)  Utilities: Not At Risk (03/01/2023)  Alcohol  Screen: Low Risk  (11/14/2021)  Depression (PHQ2-9): Low Risk  (04/05/2022)  Financial Resource Strain: Low Risk  (04/05/2022)  Physical Activity: Inactive (04/05/2022)  Social Connections: Socially Isolated (03/01/2023)  Stress: No Stress Concern Present (04/05/2022)  Tobacco Use: Low Risk  (03/01/2023)    Readmission Risk Interventions    11/08/2021    9:46 AM  Readmission Risk Prevention Plan  Transportation Screening Complete  PCP or Specialist Appt within 3-5 Days Complete  HRI or Home Care Consult Complete  Social Work Consult for Recovery Care Planning/Counseling Complete  Palliative Care Screening Not Applicable  Medication Review [employer]) Complete

## 2023-03-05 NOTE — Progress Notes (Signed)
 PROGRESS NOTE  Brian [name]  FMW:[id number] DOB: 1955/10/16 DOA: 02/26/2023 PCP: Duanne Butler DASEN, MD   Brief Narrative: Patient is a 68 year old male with history of A-fib on Eliquis , chronic combined CHF, hypertension, hyperlipidemia, CKD stage IIIb, seizure disorder, CVA with residual left hemiparesis, squamous cell carcinoma of the tongue who presented from skilled nursing facility with confusion, vomiting.  On presentation, he was bradycardic and hypotensive.  Rhythm was A-flutter with rate in the range of 40s, blood pressure systolic was in the range of 70s.  Started on vasopressors and admitted to ICU under PCCM service.  Cardiology consulted, transvenous pacemaker placed.  Hospital course also remarkable for coffee-ground emesis, anemia, GI consulted.  EGD done on 1/10 showed mild gastritis, duodenitis.   Transferred to [facility] service on 1/11.  Hospital course remarkable for A-fib with RVR, rate is still up today in the range of 120s.  Plan for dc to SNF after heart rate improves.  Cardiology following  Assessment & Plan:  Principal Problem:   Symptomatic bradycardia Active Problems:   Ileus (HCC)   Debility   AKI (acute kidney injury) (HCC)   Adjustment disorder with depressed mood   Marijuana use   Coffee ground emesis   Nausea and vomiting   Symptomatic bradycardia/complete heart block: Initial rhythm was A- flutter with bradycardia.  Status post transvenous pacemaker placement.  Pacemaker has been discontinued.  A-fib with RVR: Continue Eliquis , long-acting Cardizem .  Monitor on telemetry.  Rate not controlled this morning, with RVR in the range of 120s.  Added metoprolol .  Cardiology closely following  Suspect upper GI bleed: Found to have coffee-ground emesis during this hospitalization.  EGD done on 1/10 showed acute gastritis, duodenitis.  Continue Protonix .  Currently hemoglobin in the range of 8.  GI signed off, resumed Eliquis   Chronic combined systolic/diastolic heart  failure: Echo has shown EF of 45 to 50%, grade 2 diastolic function.  Currently euvolemic.  On Jardiance  .  Was taking Cozaar , metoprolol  previously  Type II diabetes: Was on on Jardiance .  Recent A1c of 6.6.  Currently on sliding scale  Hypertension: Presented with hypotension.  Was taking Cozaar , metoprolol , hydralazine  previously.  Currently on Cardizem , metoprolol .  Blood pressure stable  History of CVA: Has residual left hemiparesis.  Lives at [facility]. Speech therapy following for dysphagia.  Recommended dysphagia 3 diet  Pancytopenia: Currently hemoglobin in the range of 8.  Has mild leukopenia and thrombocytopenia.  Also given IV iron.  Seizure disorder: On Keppra   Hyperlipidemia: On Lipitor at home  CKD stage IIIb: Baseline creatinine around 1.5-1.7.  Creatinine of 1.7 today  Depression: There was concern for severe depression during this hospitalization.  Psychiatry consulted.  Patient refused antidepressant therapy.  Psychiatry has no contraindication on discharge to previous environment.  Needs to follow-up with psychiatry as an outpatient  Debility/deconditioning: Patient is living at nursing facility.  Discharge planning back to nursing facility when medically stable.  PT/OT consulted.         DVT prophylaxis:SCDs Start: 02/26/23 1632 apixaban  (ELIQUIS ) tablet 5 mg     Code Status: Full Code  Family Communication: Called and discussed with daughter [name] on 1/14  Patient status:Inpatient  Patient is from :SNF  Anticipated discharge to:SnF  Estimated DC date: Tomorrow, waiting improvement in the heart rate   Consultants: Cardiology,pccm, GI  Procedures:EGD  Antimicrobials:  Anti-infectives (From admission, onward)    None       Subjective: Patient seen and examined at bedside today.  Hemodynamically  stable.  Comfortably sitting in the chair.  Eating his breakfast.  Denies any chest pain or shortness of breath.  EKG monitor showed heart rate  in the range of 120-130.  Little frustrated because he is not able to go back to his nursing facility  Objective: Vitals:   03/04/23 2005 03/05/23 0003 03/05/23 0635 03/05/23 0740  BP: (!) 129/91 (!) 138/96 (!) 139/91 119/64  Pulse: 97  (!) 113 (!) 114  Resp: 18 20 (!) 22 19  Temp: 97.6 F (36.4 C) 98 F (36.7 C) 97.8 F (36.6 C) 98.6 F (37 C)  TempSrc: Oral Oral Oral Axillary  SpO2: 98%   95%  Weight:   70.6 kg   Height:        Intake/Output Summary (Last 24 hours) at 03/05/2023 1059 Last data filed at 03/05/2023 0742 Gross per 24 hour  Intake --  Output 2375 ml  Net -2375 ml   Filed Weights   03/03/23 0313 03/04/23 0624 03/05/23 0635  Weight: 71.1 kg 69.8 kg 70.6 kg    Examination:  General exam: Overall comfortable, not in distress,lying on bed HEENT: PERRL Respiratory system:  no wheezes or crackles  Cardiovascular system: Irregular irregular rhythm Gastrointestinal system: Abdomen is nondistended, soft and nontender. Central nervous system: Alert and oriented Extremities: No edema, no clubbing ,no cyanosis Skin: No rashes, no ulcers,no icterus     Data Reviewed: I have personally reviewed following labs and imaging studies  CBC: Recent Labs  Lab 02/26/23 1551 02/26/23 1843 02/28/23 0639 02/28/23 0642 03/01/23 0835 03/02/23 0337 03/03/23 0218 03/04/23 0223  WBC 4.0   < > 3.6*  --  3.7* 3.2* 2.5* 2.6*  NEUTROABS 3.0  --   --   --   --   --   --   --   HGB 8.6*   < > 9.8* 9.8* 9.8* 9.0* 8.6* 8.5*  HCT 27.7*   < > 30.5* 29.8* 30.2* 27.4* 26.5* 26.0*  MCV 91.7   < > 88.9  --  89.9 87.5 87.5 88.4  PLT 130*   < > 150  --  177 155 140* 144*   < > = values in this interval not displayed.   Basic Metabolic Panel: Recent Labs  Lab 02/26/23 1843 02/26/23 2326 02/27/23 0252 02/28/23 9360 03/01/23 9164 03/02/23 9662 03/03/23 0218 03/04/23 0223 03/05/23 0215  NA  --    < > 137 138 141 139 136 133* 135  K  --    < > 4.9 4.3 4.1 4.0 3.6 4.1 4.0  CL  --     < > 107 102 103 103 102 100 103  CO2  --    < > 21* 27 24 27 26 25 24   GLUCOSE  --    < > 71 104* 73 203* 122* 143* 133*  BUN  --    < > 48* 43* 39* 36* 28* 28* 26*  CREATININE 2.69*   < > 2.60* 2.04* 1.85* 1.81* 1.66* 1.98* 1.73*  CALCIUM   --    < > 9.2 8.6* 8.7* 8.5* 8.5* 8.3* 8.5*  MG 2.6*  --  2.6* 2.4 2.4 2.4  --   --   --   PHOS  --   --  4.9* 4.7* 3.9 3.1 3.3  --   --    < > = values in this interval not displayed.     Recent Results (from the past 240 hours)  MRSA Next Gen by PCR, Nasal  Status: None   Collection Time: 02/26/23  7:02 PM   Specimen: Urine, Clean Catch; Nasal Swab  Result Value Ref Range Status   MRSA by PCR Next Gen NOT DETECTED NOT DETECTED Final    Comment: (NOTE) The GeneXpert MRSA Assay (FDA approved for NASAL specimens only), is one component of a comprehensive MRSA colonization surveillance program. It is not intended to diagnose MRSA infection nor to guide or monitor treatment for MRSA infections. Test performance is not FDA approved in patients less than 21 years old. Performed at [facility], [address], [place], [place] [address]      Radiology Studies: No results found.  Scheduled Meds:  apixaban   5 mg Oral BID   atorvastatin   20 mg Oral Daily   Chlorhexidine  Gluconate Cloth  6 each Topical Daily   diltiazem   180 mg Oral Daily   empagliflozin   25 mg Oral Daily   feeding supplement  1 Container Oral TID BM   insulin  aspart  0-5 Units Subcutaneous QHS   insulin  aspart  0-9 Units Subcutaneous TID WC   levETIRAcetam   500 mg Oral BID   metoprolol  tartrate  12.5 mg Oral BID   mouth rinse  15 mL Mouth Rinse 4 times per day   pantoprazole   40 mg Oral BID AC   sodium chloride  flush  3 mL Intravenous Q12H   Continuous Infusions:  ferumoxytol  (FERAHEME) 510 mg in sodium chloride  0.9 % 100 mL IVPB Stopped (02/26/23 2038)     LOS: 7 days   Ivonne Mustache, MD [facility] P1/14/2025, 10:59 AM

## 2023-03-06 ENCOUNTER — Telehealth: Payer: Self-pay | Admitting: *Deleted

## 2023-03-06 ENCOUNTER — Ambulatory Visit: Payer: Medicare HMO | Admitting: Radiation Oncology

## 2023-03-06 DIAGNOSIS — R001 Bradycardia, unspecified: Secondary | ICD-10-CM | POA: Diagnosis not present

## 2023-03-06 LAB — BASIC METABOLIC PANEL
Anion gap: 12 (ref 5–15)
BUN: 29 mg/dL — ABNORMAL HIGH (ref 8–23)
CO2: 23 mmol/L (ref 22–32)
Calcium: 8.5 mg/dL — ABNORMAL LOW (ref 8.9–10.3)
Chloride: 100 mmol/L (ref 98–111)
Creatinine, Ser: 1.79 mg/dL — ABNORMAL HIGH (ref 0.61–1.24)
GFR, Estimated: 41 mL/min — ABNORMAL LOW (ref >60)
Glucose, Bld: 146 mg/dL — ABNORMAL HIGH (ref 70–99)
Potassium: 4.1 mmol/L (ref 3.5–5.1)
Sodium: 135 mmol/L (ref 135–145)

## 2023-03-06 LAB — GLUCOSE, CAPILLARY
Glucose-Capillary: 138 mg/dL — ABNORMAL HIGH (ref 70–99)
Glucose-Capillary: 151 mg/dL — ABNORMAL HIGH (ref 70–99)
Glucose-Capillary: 102 mg/dL — ABNORMAL HIGH (ref 70–99)
Glucose-Capillary: 132 mg/dL — ABNORMAL HIGH (ref 70–99)

## 2023-03-06 MED ORDER — SODIUM CHLORIDE 0.9 % IV BOLUS
500.0000 mL | Freq: Once | INTRAVENOUS | Status: AC
  Administered 2023-03-06: 500 mL via INTRAVENOUS

## 2023-03-06 MED ORDER — METOPROLOL TARTRATE 25 MG PO TABS
25.0000 mg | ORAL_TABLET | Freq: Two times a day (BID) | ORAL | Status: DC
Start: 2023-03-06 — End: 2023-03-07
  Administered 2023-03-06 – 2023-03-07 (×2): 25 mg via ORAL
  Filled 2023-03-06 (×2): qty 1

## 2023-03-06 NOTE — Progress Notes (Signed)
 PROGRESS NOTE  Brian [name]  NFA:[id number] DOB: 05-07-1955 DOA: 02/26/2023 PCP: Austine Lefort, MD   Brief Narrative: Patient is a 68 year old male with history of A-fib on Eliquis , chronic combined CHF, hypertension, hyperlipidemia, CKD stage IIIb, seizure disorder, CVA with residual left hemiparesis, squamous cell carcinoma of the tongue who presented from skilled nursing facility with confusion, vomiting.  On presentation, he was bradycardic and hypotensive.  Rhythm was A-flutter with rate in the range of 40s, blood pressure systolic was in the range of 70s.  Started on vasopressors and admitted to ICU under PCCM service.  Cardiology consulted, transvenous pacemaker placed.  Hospital course also remarkable for coffee-ground emesis, anemia, GI consulted.  EGD done on 1/10 showed mild gastritis, duodenitis.   Transferred to [facility] service on 1/11.  Hospital course remarkable for A-fib with RVR, rate is still up today in the range of 120s.  Plan for dc to SNF after heart rate improves.  Cardiology following.Likely tomorrow  Assessment & Plan:  Principal Problem:   Symptomatic bradycardia Active Problems:   Ileus (HCC)   Debility   AKI (acute kidney injury) (HCC)   Adjustment disorder with depressed mood   Marijuana use   Coffee ground emesis   Nausea and vomiting   Symptomatic bradycardia/complete heart block: Initial rhythm was A- flutter with bradycardia.  Status post transvenous pacemaker placement.  Pacemaker has been discontinued.  A-fib with RVR: Continue Eliquis , long-acting Cardizem .  Monitor on telemetry.  Rate not controlled this morning, with RVR in the range of 100-120s.  Increased the dose of metoprolol .  Will order NS bolus 500 mL to help with the blood pressure.  Cardiology closely following  Suspect upper GI bleed: Found to have coffee-ground emesis during this hospitalization.  EGD done on 1/10 showed acute gastritis, duodenitis.  Continue Protonix .  Currently hemoglobin  in the range of 8.  GI signed off, resumed Eliquis   Chronic combined systolic/diastolic heart failure: Echo has shown EF of 45 to 50%, grade 2 diastolic function.  Currently euvolemic.  On Jardiance , Cozaar , metoprolol  previously  Type II diabetes: Was on on Jardiance .  Recent A1c of 6.6.  Currently on sliding scale  Hypertension: Presented with hypotension.  Was taking Cozaar , metoprolol , hydralazine  previously.  Currently on Cardizem , metoprolol .  Blood pressure little soft, given a dose of 500 mL normal saline bolus  History of CVA: Has residual left hemiparesis.  Lives at [facility]. Speech therapy following for dysphagia.  Recommended dysphagia 3 diet  Pancytopenia: Currently hemoglobin in the range of 8.  Has mild leukopenia and thrombocytopenia.  Also given IV iron.  Seizure disorder: On Keppra   Hyperlipidemia: On Lipitor at home  CKD stage IIIb: Baseline creatinine around 1.5-1.7.  Kidney function at baseline  Depression: There was concern for severe depression during this hospitalization.  Psychiatry consulted.  Patient refused antidepressant therapy.  Psychiatry has no contraindication on discharge to previous environment.  Needs to follow-up with psychiatry as an outpatient  Debility/deconditioning: Patient is living at nursing facility.  Discharge planning back to nursing facility when medically stable.  Likely tomorrow        DVT prophylaxis:SCDs Start: 02/26/23 1632 apixaban  (ELIQUIS ) tablet 5 mg     Code Status: Full Code  Family Communication: Called and discussed with daughter [name] on 1/14  Patient status:Inpatient  Patient is from :SNF  Anticipated discharge to:SnF  Estimated DC date: Tomorrow, waiting improvement in the heart rate   Consultants: Cardiology,pccm, GI  Procedures:EGD  Antimicrobials:  Anti-infectives (From  admission, onward)    None       Subjective: Patient seen and examined at bedside today.  He appears comfortable,  lying in bed but heart rate ranging from 100 to 120/min.  Denies any chest pain or shortness of breath.  Objective: Vitals:   03/06/23 0500 03/06/23 0531 03/06/23 0744 03/06/23 0926  BP:  (!) 149/107 100/73 104/73  Pulse:  (!) 118 (!) 104 (!) 114  Resp:  20 14   Temp:  97.7 F (36.5 C) 98.2 F (36.8 C)   TempSrc:  Oral Oral   SpO2:  95% 96%   Weight: 70.7 kg     Height:        Intake/Output Summary (Last 24 hours) at 03/06/2023 1106 Last data filed at 03/06/2023 0948 Gross per 24 hour  Intake --  Output 1985 ml  Net -1985 ml   Filed Weights   03/04/23 0624 03/05/23 0635 03/06/23 0500  Weight: 69.8 kg 70.6 kg 70.7 kg    Examination:  General exam: Overall comfortable, not in distress HEENT: PERRL Respiratory system:  no wheezes or crackles  Cardiovascular system: Irregularly irregular rhythm Gastrointestinal system: Abdomen is nondistended, soft and nontender. Central nervous system: Alert and oriented Extremities: No edema, no clubbing ,no cyanosis Skin: No rashes, no ulcers,no icterus     Data Reviewed: I have personally reviewed following labs and imaging studies  CBC: Recent Labs  Lab 02/28/23 0639 02/28/23 6045 03/01/23 0835 03/02/23 0337 03/03/23 0218 03/04/23 0223  WBC 3.6*  --  3.7* 3.2* 2.5* 2.6*  HGB 9.8* 9.8* 9.8* 9.0* 8.6* 8.5*  HCT 30.5* 29.8* 30.2* 27.4* 26.5* 26.0*  MCV 88.9  --  89.9 87.5 87.5 88.4  PLT 150  --  177 155 140* 144*   Basic Metabolic Panel: Recent Labs  Lab 02/28/23 0639 03/01/23 0835 03/02/23 0337 03/03/23 0218 03/04/23 0223 03/05/23 0215 03/06/23 0234  NA 138 141 139 136 133* 135 135  K 4.3 4.1 4.0 3.6 4.1 4.0 4.1  CL 102 103 103 102 100 103 100  CO2 27 24 27 26 25 24 23   GLUCOSE 104* 73 203* 122* 143* 133* 146*  BUN 43* 39* 36* 28* 28* 26* 29*  CREATININE 2.04* 1.85* 1.81* 1.66* 1.98* 1.73* 1.79*  CALCIUM  8.6* 8.7* 8.5* 8.5* 8.3* 8.5* 8.5*  MG 2.4 2.4 2.4  --   --   --   --   PHOS 4.7* 3.9 3.1 3.3  --   --   --       Recent Results (from the past 240 hours)  MRSA Next Gen by PCR, Nasal     Status: None   Collection Time: 02/26/23  7:02 PM   Specimen: Urine, Clean Catch; Nasal Swab  Result Value Ref Range Status   MRSA by PCR Next Gen NOT DETECTED NOT DETECTED Final    Comment: (NOTE) The GeneXpert MRSA Assay (FDA approved for NASAL specimens only), is one component of a comprehensive MRSA colonization surveillance program. It is not intended to diagnose MRSA infection nor to guide or monitor treatment for MRSA infections. Test performance is not FDA approved in patients less than 6 years old. Performed at [facility], [address], [place], [place] [address]      Radiology Studies: No results found.  Scheduled Meds:  apixaban   5 mg Oral BID   atorvastatin   20 mg Oral Daily   diltiazem   180 mg Oral Daily   feeding supplement  1 Container Oral  TID BM   insulin  aspart  0-5 Units Subcutaneous QHS   insulin  aspart  0-9 Units Subcutaneous TID WC   levETIRAcetam   500 mg Oral BID   metoprolol  tartrate  25 mg Oral BID   mouth rinse  15 mL Mouth Rinse 4 times per day   pantoprazole   40 mg Oral BID AC   sodium chloride  flush  3 mL Intravenous Q12H   Continuous Infusions:  sodium chloride        LOS: 8 days   Leona Rake, MD [facility] P1/15/2025, 11:06 AM

## 2023-03-06 NOTE — Progress Notes (Signed)
 Progress Note  Patient Name: Brian [name] Date of Encounter: 03/06/2023  Primary Cardiologist:   Eilleen Grates, MD   Subjective   No SOB.  No pain.  Does not notice palpitations.  Inpatient Medications    Scheduled Meds:  apixaban   5 mg Oral BID   atorvastatin   20 mg Oral Daily   diltiazem   180 mg Oral Daily   empagliflozin   25 mg Oral Daily   feeding supplement  1 Container Oral TID BM   insulin  aspart  0-5 Units Subcutaneous QHS   insulin  aspart  0-9 Units Subcutaneous TID WC   levETIRAcetam   500 mg Oral BID   metoprolol  tartrate  12.5 mg Oral BID   mouth rinse  15 mL Mouth Rinse 4 times per day   pantoprazole   40 mg Oral BID AC   sodium chloride  flush  3 mL Intravenous Q12H   Continuous Infusions:   PRN Meds: hydrALAZINE , metoprolol  tartrate, ondansetron  (ZOFRAN ) IV, mouth rinse, sodium chloride  flush   Vital Signs    Vitals:   03/06/23 0100 03/06/23 0500 03/06/23 0531 03/06/23 0744  BP:   (!) 149/107 100/73  Pulse:   (!) 118 (!) 104  Resp: 17  20 14   Temp:   97.7 F (36.5 C) 98.2 F (36.8 C)  TempSrc:   Oral Oral  SpO2: 95%  95% 96%  Weight:  70.7 kg    Height:        Intake/Output Summary (Last 24 hours) at 03/06/2023 0911 Last data filed at 03/06/2023 0530 Gross per 24 hour  Intake --  Output 1610 ml  Net -1610 ml   Filed Weights   03/04/23 0624 03/05/23 0635 03/06/23 0500  Weight: 69.8 kg 70.6 kg 70.7 kg    Telemetry    Atrial fib with rapid rate - Personally Reviewed  ECG    NA - Personally Reviewed  Physical Exam   GEN: No  acute distress.   Neck: No  JVD Cardiac: Irregular RR, no murmurs, rubs, or gallops.  Respiratory:    Few basilar crackles.  GI: Soft, nontender, non-distended, normal bowel sounds  MS:  No  edema; No deformity. Neuro:   Nonfocal  Psych: Oriented and appropriate    Labs    Chemistry Recent Labs  Lab 03/02/23 0337 03/03/23 0218 03/04/23 0223 03/05/23 0215 03/06/23 0234  NA 139 136 133* 135  135  K 4.0 3.6 4.1 4.0 4.1  CL 103 102 100 103 100  CO2 27 26 25 24 23   GLUCOSE 203* 122* 143* 133* 146*  BUN 36* 28* 28* 26* 29*  CREATININE 1.81* 1.66* 1.98* 1.73* 1.79*  CALCIUM  8.5* 8.5* 8.3* 8.5* 8.5*  ALBUMIN 2.6* 2.7*  --   --   --   GFRNONAA 40* 44* 36* 42* 41*  ANIONGAP 9 8 8 8 12      Hematology Recent Labs  Lab 03/02/23 0337 03/03/23 0218 03/04/23 0223  WBC 3.2* 2.5* 2.6*  RBC 3.13* 3.03* 2.94*  HGB 9.0* 8.6* 8.5*  HCT 27.4* 26.5* 26.0*  MCV 87.5 87.5 88.4  MCH 28.8 28.4 28.9  MCHC 32.8 32.5 32.7  RDW 16.1* 15.9* 15.9*  PLT 155 140* 144*    Cardiac EnzymesNo results for input(s): "TROPONINI" in the last 168 hours. No results for input(s): "TROPIPOC" in the last 168 hours.   BNP No results for input(s): "BNP", "PROBNP" in the last 168 hours.    DDimer  No results for input(s): "DDIMER" in the last 168  hours.    Radiology    No results found.  Cardiac Studies   Echo  1. Cannot fully assess wall motion. Left ventricular ejection fraction,  by estimation, is 60 to 65%. The left ventricle has normal function. There  is moderate concentric left ventricular hypertrophy. Left ventricular  diastolic parameters are  indeterminate.   2. Right ventricular systolic function is mildly reduced. The right  ventricular size is mildly enlarged. Tricuspid regurgitation signal is  inadequate for assessing PA pressure.   3. Left atrial size was moderately dilated.   4. Right atrial size was mildly dilated.   5. The mitral valve was not well visualized. No evidence of mitral valve  regurgitation.   6. The aortic valve was not well visualized. Aortic valve regurgitation  is not visualized.   7. The inferior vena cava is dilated in size with >50% respiratory  variability, suggesting right atrial pressure of 8 mmHg.   Patient Profile     68 y.o. male history of AF, prior strokes with hx of conduction disease.   Assessment & Plan   CHB:   Resolved.    Atrial  flutter:   Resumed Eliquis . Added low dose beta blocker yesterday.   BP is labile.  His EF is NL.  He is not volume overloaded.  I am going to discontinue Jardiance .  He might be on the volume low side exacerbating his tachycardia.  It does seem to be positional.   He is net negative 9.6 liters .     GI bleed:   Resumed Eliquis .  No CBC today but no evidence of active bleeding.     HTN:    This is being managed in the context of treating his HR.     AKI:    Creat relatively stable to trending down.  Avoid ARB/ARNi  For questions or updates, please contact [facility] Please consult [email or web address] for contact info under Cardiology/STEMI.   Signed, Eilleen Grates, MD  03/06/2023, 9:11 AM

## 2023-03-06 NOTE — Plan of Care (Signed)
  Problem: Nutritional: Goal: Maintenance of adequate nutrition will improve Outcome: Progressing Goal: Progress toward achieving an optimal weight will improve Outcome: Progressing   Problem: Skin Integrity: Goal: Risk for impaired skin integrity will decrease Outcome: Progressing   Problem: Activity: Goal: Risk for activity intolerance will decrease Outcome: Progressing   Problem: Nutrition: Goal: Adequate nutrition will be maintained Outcome: Progressing   Problem: Coping: Goal: Level of anxiety will decrease Outcome: Progressing   Problem: Pain Management: Goal: General experience of comfort will improve Outcome: Progressing   Problem: Safety: Goal: Ability to remain free from injury will improve Outcome: Progressing   Problem: Skin Integrity: Goal: Risk for impaired skin integrity will decrease Outcome: Progressing

## 2023-03-06 NOTE — Plan of Care (Signed)
  Problem: Clinical Measurements: Goal: Ability to maintain clinical measurements within normal limits will improve Outcome: Progressing   Problem: Nutritional: Goal: Maintenance of adequate nutrition will improve Outcome: Progressing

## 2023-03-06 NOTE — Telephone Encounter (Signed)
 RETURNED PATIENT'S SON'S PHONE CALL, SPOKE WITH SON (AARON)

## 2023-03-06 NOTE — Progress Notes (Signed)
 Mobility Specialist Progress Note;   03/06/23 1500  Mobility  Activity Ambulated with assistance in hallway  Level of Assistance Contact guard assist, steadying assist  Assistive Device Front wheel walker  Distance Ambulated (ft) 400 ft  RLE Weight Bearing Per Provider Order WBAT  LLE Weight Bearing Per Provider Order WBAT  Activity Response Tolerated well  Mobility Referral Yes  Mobility visit 1 Mobility  Mobility Specialist Start Time (ACUTE ONLY) 1500  Mobility Specialist Stop Time (ACUTE ONLY) 1520  Mobility Specialist Time Calculation (min) (ACUTE ONLY) 20 min   Pt agreeable to mobility. Required MinG assistance throughout ambulation for safety. No c/o during session. Max HR during ambulation was 124, otherwise VSS throughout. Pt returned to bed with all needs met, alarm on.  Janit Meline Mobility Specialist Please contact via SecureChat or [employer] [phone]

## 2023-03-06 NOTE — TOC Progression Note (Addendum)
 Transition of Care [facility]) - Progression Note    Patient Details  Name: Brian [name] MRN: [id number] Date of Birth: 13-Mar-1955  Transition of Care [facility]) CM/SW Contact  Juliane Och, [name] Phone Number: 03/06/2023, 11:58 AM  Clinical Narrative:     11:58 AM CSW received notification that insurance authorization offered a peer to peer (Reference [id number]) due today at 3:00PM. CSW relayed following information to hospitalist.  1:25 PM CSW received notification from hospitalist that insurance authorization for LTC was approved. CSW relayed this information to patient at bedside and informed him of expected discharge tomorrow. Patient expressed understanding of this. CSW offered to call patient's daughter, [name], regaridng discharge plans. Patient accepted offer, and CSW called [name] to inform her of patient's expected discharge to [facility]. [name] requested discharge to be between times of 9:00AM-1:00PM. CSW relayed this request to hospitalist and bedside RN.  Expected Discharge Plan: Skilled Nursing Facility Barriers to Discharge: Continued Medical Work up  Expected Discharge Plan and Services In-house Referral: Clinical Social Work   Post Acute Care Choice: Skilled Nursing Facility Living arrangements for the past 2 months: Skilled Nursing Facility                                       Social Determinants of Health (SDOH) Interventions SDOH Screenings   Food Insecurity: No Food Insecurity (03/01/2023)  Housing: Low Risk  (03/01/2023)  Transportation Needs: Unmet Transportation Needs (03/01/2023)  Utilities: Not At Risk (03/01/2023)  Alcohol Screen: Low Risk  (11/14/2021)  Depression (PHQ2-9): Low Risk  (04/05/2022)  Financial Resource Strain: Low Risk  (04/05/2022)  Physical Activity: Inactive (04/05/2022)  Social Connections: Socially Isolated (03/01/2023)  Stress: No Stress Concern Present (04/05/2022)  Tobacco Use: Low Risk  (03/01/2023)     Readmission Risk Interventions    11/08/2021    9:46 AM  Readmission Risk Prevention Plan  Transportation Screening Complete  PCP or Specialist Appt within 3-5 Days Complete  HRI or Home Care Consult Complete  Social Work Consult for Recovery Care Planning/Counseling Complete  Palliative Care Screening Not Applicable  Medication Review Oceanographer) Complete

## 2023-03-07 ENCOUNTER — Ambulatory Visit: Payer: Medicare HMO | Attending: Cardiology

## 2023-03-07 ENCOUNTER — Other Ambulatory Visit: Payer: Self-pay | Admitting: Cardiology

## 2023-03-07 ENCOUNTER — Other Ambulatory Visit (HOSPITAL_COMMUNITY): Payer: Self-pay

## 2023-03-07 DIAGNOSIS — G4089 Other seizures: Secondary | ICD-10-CM | POA: Diagnosis not present

## 2023-03-07 DIAGNOSIS — I483 Typical atrial flutter: Secondary | ICD-10-CM

## 2023-03-07 DIAGNOSIS — G40909 Epilepsy, unspecified, not intractable, without status epilepticus: Secondary | ICD-10-CM | POA: Diagnosis not present

## 2023-03-07 DIAGNOSIS — I69354 Hemiplegia and hemiparesis following cerebral infarction affecting left non-dominant side: Secondary | ICD-10-CM | POA: Diagnosis not present

## 2023-03-07 DIAGNOSIS — R41841 Cognitive communication deficit: Secondary | ICD-10-CM | POA: Diagnosis not present

## 2023-03-07 DIAGNOSIS — R1312 Dysphagia, oropharyngeal phase: Secondary | ICD-10-CM | POA: Diagnosis not present

## 2023-03-07 DIAGNOSIS — R262 Difficulty in walking, not elsewhere classified: Secondary | ICD-10-CM | POA: Diagnosis not present

## 2023-03-07 DIAGNOSIS — Z741 Need for assistance with personal care: Secondary | ICD-10-CM | POA: Diagnosis not present

## 2023-03-07 DIAGNOSIS — R001 Bradycardia, unspecified: Secondary | ICD-10-CM

## 2023-03-07 DIAGNOSIS — I480 Paroxysmal atrial fibrillation: Secondary | ICD-10-CM | POA: Diagnosis not present

## 2023-03-07 DIAGNOSIS — M6259 Muscle wasting and atrophy, not elsewhere classified, multiple sites: Secondary | ICD-10-CM | POA: Diagnosis not present

## 2023-03-07 LAB — BASIC METABOLIC PANEL
Anion gap: 4 — ABNORMAL LOW (ref 5–15)
BUN: 32 mg/dL — ABNORMAL HIGH (ref 8–23)
CO2: 24 mmol/L (ref 22–32)
Calcium: 8.2 mg/dL — ABNORMAL LOW (ref 8.9–10.3)
Chloride: 104 mmol/L (ref 98–111)
Creatinine, Ser: 1.7 mg/dL — ABNORMAL HIGH (ref 0.61–1.24)
GFR, Estimated: 43 mL/min — ABNORMAL LOW (ref >60)
Glucose, Bld: 154 mg/dL — ABNORMAL HIGH (ref 70–99)
Potassium: 4.1 mmol/L (ref 3.5–5.1)
Sodium: 132 mmol/L — ABNORMAL LOW (ref 135–145)

## 2023-03-07 LAB — CBC
HCT: 29.2 % — ABNORMAL LOW (ref 39.0–52.0)
Hemoglobin: 9.5 g/dL — ABNORMAL LOW (ref 13.0–17.0)
MCH: 29.5 pg (ref 26.0–34.0)
MCHC: 32.5 g/dL (ref 30.0–36.0)
MCV: 90.7 fL (ref 80.0–100.0)
Platelets: 148 10*3/uL — ABNORMAL LOW (ref 150–400)
RBC: 3.22 MIL/uL — ABNORMAL LOW (ref 4.22–5.81)
RDW: 17 % — ABNORMAL HIGH (ref 11.5–15.5)
WBC: 4.1 10*3/uL (ref 4.0–10.5)
nRBC: 0 % (ref 0.0–0.2)

## 2023-03-07 LAB — GLUCOSE, CAPILLARY: Glucose-Capillary: 128 mg/dL — ABNORMAL HIGH (ref 70–99)

## 2023-03-07 MED ORDER — DILTIAZEM HCL ER COATED BEADS 180 MG PO CP24
180.0000 mg | ORAL_CAPSULE | Freq: Every day | ORAL | 0 refills | Status: DC
  Filled 2023-03-07: qty 60, 60d supply, fill #0

## 2023-03-07 MED ORDER — METOPROLOL TARTRATE 25 MG PO TABS
25.0000 mg | ORAL_TABLET | Freq: Two times a day (BID) | ORAL | 0 refills | Status: DC
  Filled 2023-03-07: qty 120, 60d supply, fill #0

## 2023-03-07 MED ORDER — METOPROLOL TARTRATE 25 MG PO TABS: 25.0000 mg | ORAL_TABLET | Freq: Two times a day (BID) | ORAL | Status: DC

## 2023-03-07 MED ORDER — METOPROLOL SUCCINATE ER 50 MG PO TB24: 50.0000 mg | ORAL_TABLET | Freq: Every day | ORAL | Status: DC

## 2023-03-07 MED ORDER — GLIPIZIDE 2.5 MG HALF TABLET: 2.5000 mg | ORAL_TABLET | Freq: Every day | ORAL | Status: DC

## 2023-03-07 MED ORDER — METOPROLOL SUCCINATE ER 50 MG PO TB24
50.0000 mg | ORAL_TABLET | Freq: Every day | ORAL | 1 refills | Status: DC
  Filled 2023-03-07: qty 30, 30d supply, fill #0

## 2023-03-07 MED ORDER — PANTOPRAZOLE SODIUM 40 MG PO TBEC
40.0000 mg | DELAYED_RELEASE_TABLET | Freq: Two times a day (BID) | ORAL | 0 refills | Status: DC
  Filled 2023-03-07: qty 120, 60d supply, fill #0

## 2023-03-07 NOTE — Progress Notes (Signed)
Physical Therapy Treatment Patient Details Name: Brian [name] MRN: [id number] DOB: 1955-07-24 Today's Date: 03/07/2023   History of Present Illness 68 y.o. male admitted from [facility]/26/[facility] with lethargy and AMS after ingesting unknown pills. Workup revealed aflutter, CHF; temporary pacer placed 1/7-1/8. Coffee ground emesis 1/8; NGT placed 1/8. Plan EGD 1/10. PMH includes afib on Eliquis, HTN, CHF, HLD, CKD 3b, seizure disorder, R MCA CVA (residual L hemiparesis), squamous cell lung CA, depression.    PT Comments  Pt is continuing to progress towards goals. Pt was Mod I for supine to sitting this session, CGA for sit to stand and gait. Pt tolerated gait well with some fatigue in the L hand after ~100 ft of gait. Due to pt current functional status, home set up and available assistance at home recommending skilled physical therapy services < 3 hours/day in order to address strength, balance and functional mobility to decrease risk for falls, injury, immobility, skin break down and re-hospitalization.      If plan is discharge home, recommend the following: A little help with walking and/or transfers;Assistance with cooking/housework;Assist for transportation;Help with stairs or ramp for entrance   Can travel by private vehicle     Yes  Equipment Recommendations  None recommended by PT       Precautions / Restrictions Precautions Precautions: Fall;Other (comment) Precaution Comments: watch HR; h/o R MCA CVA with residual L-side weakness Restrictions Weight Bearing Restrictions Per Provider Order: No RLE Weight Bearing Per Provider Order: Weight bearing as tolerated LLE Weight Bearing Per Provider Order: Weight bearing as tolerated     Mobility  Bed Mobility Overal bed mobility: Modified Independent Bed Mobility: Supine to Sit     Supine to sit: Modified independent (Device/Increase time)          Transfers Overall transfer level: Needs assistance Equipment used:  Rolling walker (2 wheels) Transfers: Sit to/from Stand Sit to Stand: Contact guard assist           General transfer comment: Assist for balance and lines    Ambulation/Gait Ambulation/Gait assistance: Contact guard assist Gait Distance (Feet): 250 Feet Assistive device: Rolling walker (2 wheels) Gait Pattern/deviations: Step-through pattern, Decreased stride length, Trunk flexed Gait velocity: decreased Gait velocity interpretation: <1.8 ft/sec, indicate of risk for recurrent falls   General Gait Details: Assist for safety and lines when amb with walker. Pt with slight circumduction of LLE with swing.  After 100 ft pt started to have difficulty grasping the L hand side of the RW.After a short rest break grip improved.        Balance Overall balance assessment: Needs assistance Sitting-balance support: No upper extremity supported, Feet supported, Feet unsupported Sitting balance-Leahy Scale: Good     Standing balance support: Single extremity supported, Bilateral upper extremity supported, During functional activity, Reliant on assistive device for balance Standing balance-Leahy Scale: Fair        Cognition Arousal: Alert Behavior During Therapy: WFL for tasks assessed/performed Overall Cognitive Status: Within Functional Limits for tasks assessed           General Comments General comments (skin integrity, edema, etc.): No noted issues with activity HR up to 133 bpm      Pertinent Vitals/Pain Pain Assessment Pain Assessment: No/denies pain     PT Goals (current goals can now be found in the care plan section) Acute Rehab PT Goals Patient Stated Goal: get out of hospital PT Goal Formulation: With patient Time For Goal Achievement: 03/15/23 Potential to Achieve  Goals: Good Progress towards PT goals: Progressing toward goals    Frequency    Min 1X/week      PT Plan  Continue with current POC        AM-PAC PT "6 Clicks" Mobility   Outcome  Measure  Help needed turning from your back to your side while in a flat bed without using bedrails?: None Help needed moving from lying on your back to sitting on the side of a flat bed without using bedrails?: None Help needed moving to and from a bed to a chair (including a wheelchair)?: A Little Help needed standing up from a chair using your arms (e.g., wheelchair or bedside chair)?: A Little Help needed to walk in hospital room?: A Little Help needed climbing 3-5 steps with a railing? : A Little 6 Click Score: 20    End of Session Equipment Utilized During Treatment: Gait belt Activity Tolerance: Patient tolerated treatment well Patient left: in bed;with call bell/phone within reach (pt sitting EOB RN notified) Nurse Communication: Mobility status PT Visit Diagnosis: Other abnormalities of gait and mobility (R26.89)     Time: 2956-2130 PT Time Calculation (min) (ACUTE ONLY): 12 min  Charges:    $Therapeutic Activity: 8-22 mins PT General Charges $$ ACUTE PT VISIT: 1 Visit                     Harrel Carina, DPT, CLT  Acute Rehabilitation Services Office: [phone] (Secure chat preferred)    Claudia Desanctis 03/07/2023, 12:08 PM

## 2023-03-07 NOTE — Progress Notes (Signed)
[facility] called. [facility] LPN called and given report. No further questions. Patient daughter to transport patient back to facility. PIV removed prior to discharge. AVS and discharge instruction given to daughter.

## 2023-03-07 NOTE — TOC Progression Note (Addendum)
Transition of Care [facility]) - Progression Note    Patient Details  Name: Brian [name] MRN: [id number] Date of Birth: [birth year]  Transition of [facility]) CM/SW Contact  Marliss Coots, [name] Phone Number: 03/07/2023, 8:43 AM  Clinical Narrative:     8:43 AM [facility] informed CSW that patient's daughter, Lowanda Foster, needs to sign paperwork prior to patient's discharge. CSW called [name] and relayed SNF request. [name] informed CSW that she would be able to sign paperwork after 10:15AM be able to pick up patient then (around 11:00AM).  10:06 AM CSW called [name] to inform her that hospitalist requested cardiology to evaluate patient for discharge today and have yet to do so. CSW offered to call once cardiology evaluates patient. [name] expressed understanding of this and accepted CSW request.   Expected Discharge Plan: Skilled Nursing Facility Barriers to Discharge: Continued Medical Work up  Expected Discharge Plan and Services In-house Referral: Clinical Social Work   Post Acute Care Choice: Skilled Nursing Facility Living arrangements for the past 2 months: Skilled Nursing Facility                                       Social Determinants of Health (SDOH) Interventions SDOH Screenings   Food Insecurity: No Food Insecurity (03/01/2023)  Housing: Low Risk  (03/01/2023)  Transportation Needs: Unmet Transportation Needs (03/01/2023)  Utilities: Not At Risk (03/01/2023)  Alcohol Screen: Low Risk  (11/14/2021)  Depression (PHQ2-9): Low Risk  (04/05/2022)  Financial Resource Strain: Low Risk  (04/05/2022)  Physical Activity: Inactive (04/05/2022)  Social Connections: Socially Isolated (03/01/2023)  Stress: No Stress Concern Present (04/05/2022)  Tobacco Use: Low Risk  (03/01/2023)    Readmission Risk Interventions    11/08/2021    9:46 AM  Readmission Risk Prevention Plan  Transportation Screening Complete  PCP or Specialist Appt within 3-5 Days Complete   HRI or Home Care Consult Complete  Social Work Consult for Recovery Care Planning/Counseling Complete  Palliative Care Screening Not Applicable  Medication Review Oceanographer) Complete

## 2023-03-07 NOTE — Progress Notes (Unsigned)
Enrolled for [employer] to mail a ZIO XT long term holter monitor to the patients address on file.   Dr. Hochrein to read. 

## 2023-03-07 NOTE — Discharge Summary (Addendum)
Physician Discharge Summary  KORDAI SKEMP ZOX:[id number] DOB: 02/25/1955 DOA: 02/26/2023  PCP: [name], MD  Admit date: 02/26/2023 Discharge date: 03/07/2023  Admitted From: Home Disposition:  Home  Discharge Condition:Stable CODE STATUS:FULL Diet recommendation:  Dysphagia 3  Brief/Interim Summary: Patient is a 68 year old male with history of A-fib on Eliquis, chronic combined CHF, hypertension, hyperlipidemia, CKD stage IIIb, seizure disorder, CVA with residual left hemiparesis, squamous cell carcinoma of the tongue who presented from skilled nursing facility with confusion, vomiting. On presentation, he was bradycardic and hypotensive. Rhythm was A-flutter with rate in the range of 40s, blood pressure systolic was in the range of 70s. Started on vasopressors and admitted to ICU under PCCM service. Cardiology consulted, transvenous pacemaker placed. Hospital course also remarkable for coffee-ground emesis, anemia, GI consulted. EGD done on 1/10 showed mild gastritis, duodenitis. Transferred to [facility] service on 1/11. Hospital course remarkable for A-fib with RVR , is better today.  Cardiology cleared for discharge.  He will follow-up with cardiology as an outpatient.  Following problems were addressed during the hospitalization:  Symptomatic bradycardia/complete heart block: Initial rhythm was A- flutter with bradycardia.  Status post transvenous pacemaker placement.  Pacemaker has been discontinued.   A-fib with RVR: Continue Eliquis, long-acting Cardizem.  Monitor on telemetry.  Rate not controlled this morning, with RVR in the range of 100-120s.  Increased the dose of metoprolol.  Heart rate better today.  He will follow-up with cardiology as an outpatient.  Will be in Zio patch on dc   suspect upper GI bleed: Found to have coffee-ground emesis during this hospitalization.  EGD done on 1/10 showed acute gastritis, duodenitis.  Continue Protonix.  Currently hemoglobin in the range of  8.  GI signed off, resumed Eliquis   Chronic combined systolic/diastolic heart failure: Echo has shown EF of 45 to 50%, grade 2 diastolic function.  Currently euvolemic.    Type II diabetes: Was on on Jardiance.Now stopped.  Recent A1c of 6.6.  Continue sliding scale   Hypertension: Presented with hypotension.    Currently on Cardizem, metoprolol.  Blood pressure stable this mrng   History of CVA: Has residual left hemiparesis.  Lives at [facility]. Speech therapy following for dysphagia.  Recommended dysphagia 3 diet   Pancytopenia: Currently hemoglobin in the range of 8.  Has mild leukopenia and thrombocytopenia.  Also given IV iron.   Seizure disorder: On Keppra   Hyperlipidemia: On Lipitor at home   CKD stage IIIb: Baseline creatinine around 1.5-1.7.  Kidney function at baseline   Depression: There was concern for severe depression during this hospitalization.  Psychiatry consulted.  Patient refused antidepressant therapy.  Psychiatry has no contraindication on discharge to previous environment.  He needs to follow-up with psychiatry as an outpatient   Debility/deconditioning: Patient is living at nursing facility.  Discharge planning back to nursing facility   Discharge Diagnoses:  Principal Problem:   Symptomatic bradycardia Active Problems:   Ileus (HCC)   Debility   AKI (acute kidney injury) (HCC)   Adjustment disorder with depressed mood   Marijuana use   Coffee ground emesis   Nausea and vomiting    Discharge Instructions  Discharge Instructions     Diet general   Complete by: As directed    Dysphagia 3   Discharge instructions   Complete by: As directed    1)Please take prescribed medications as instructed 2)Do  CBC, BMP tests in a week 3)You be called by cardiology for follow-up appointment  Increase activity slowly   Complete by: As directed       Allergies as of 03/07/2023       Reactions   Codeine Other (See Comments)   Unknown reaction,  severe headach   Metformin And Related Diarrhea        Medication List     STOP taking these medications    empagliflozin 25 MG Tabs tablet Commonly known as: Jardiance   hydrALAZINE 50 MG tablet Commonly known as: APRESOLINE   losartan 100 MG tablet Commonly known as: COZAAR       TAKE these medications    apixaban 5 MG Tabs tablet Commonly known as: Eliquis Take 1 tablet (5 mg total) by mouth 2 (two) times daily.   atorvastatin 20 MG tablet Commonly known as: LIPITOR Take 1 tablet (20 mg total) by mouth daily. What changed: when to take this   diltiazem 180 MG 24 hr capsule Commonly known as: CARDIZEM CD Take 1 capsule (180 mg total) by mouth daily. Start taking on: March 08, 2023 What changed:  medication strength how much to take   FreeStyle Libre 2 Sensor Misc Use as directed to monitor blood glucose continuously. Change sensor Q 14 days. DX E11.65   glucose blood test strip 1 each by Other route as needed for other. Use as instructed   levETIRAcetam 500 MG tablet Commonly known as: KEPPRA Take 1 tablet (500 mg total) by mouth 2 (two) times daily.   Magnesium 200 MG Tabs Take 400 mg by mouth daily.   metoprolol succinate 50 MG 24 hr tablet Commonly known as: Toprol XL Take 1 tablet (50 mg total) by mouth daily. Start on 1/17 Take with or immediately following a meal. Start taking on: March 08, 2023 What changed:  medication strength how much to take additional instructions   metoprolol tartrate 25 MG tablet Commonly known as: LOPRESSOR Take 1 tablet (25 mg total) by mouth 2 (two) times daily. Take 1 tablet on 1/16 Evening   NovoLOG FlexPen 100 UNIT/ML FlexPen Generic drug: insulin aspart 0-9 Units, Subcutaneous, 3 times daily with meals CBG < 70: Implement Hypoglycemia measures CBG 70 - 120: 0 units CBG 121 - 150: 1 unit CBG 151 - 200: 2 units CBG 201 - 250: 3 units CBG 251 - 300: 5 units CBG 301 - 350: 7 units CBG 351 - 400: 9 units CBG  > 400: call MD   pantoprazole 40 MG tablet Commonly known as: PROTONIX Take 1 tablet (40 mg total) by mouth 2 (two) times daily before a meal.   polyethylene glycol 17 g packet Commonly known as: MIRALAX / GLYCOLAX Take 17 g by mouth daily as needed for mild constipation or moderate constipation.        Allergies  Allergen Reactions   Codeine Other (See Comments)    Unknown reaction, severe headach   Metformin And Related Diarrhea    Consultations: Cardiology,GI   Procedures/Studies: DG Abd 1 View Result Date: 02/27/2023 CLINICAL DATA:  Check gastric catheter placement EXAM: ABDOMEN - 1 VIEW COMPARISON:  None Available. FINDINGS: Gastric catheter extends into the stomach which is well distended with air. The proximal side port lies in the distal esophagus. This should be advanced several cm deeper into the stomach. Small left pleural effusion is noted. IMPRESSION: Gastric catheter as described. This should be advanced into the stomach. Electronically Signed   By: Alcide Clever M.D.   On: 02/27/2023 23:40   DG Chest [facility] 1 View Result  Date: 02/27/2023 CLINICAL DATA:  Emesis EXAM: PORTABLE CHEST 1 VIEW COMPARISON:  02/26/2023 FINDINGS: Cardiac shadow is stable. Aortic calcifications are noted. New patchy airspace opacity is noted in the right mid lung with stable airspace opacity in the left mid lung. Mild vascular congestion remains. Small effusion is noted on the left. IMPRESSION: Increasing right-sided airspace opacity. The remainder of the exam is stable. Electronically Signed   By: Alcide Clever M.D.   On: 02/27/2023 22:30   ECHOCARDIOGRAM COMPLETE Result Date: 02/27/2023    ECHOCARDIOGRAM REPORT   Patient Name:   Brian [name] Date of Exam: 02/27/2023 Medical Rec #:  [id number]         Height:       69.0 in Accession #:    [id number]        Weight:       167.5 lb Date of Birth:  Dec 26, 1955          BSA:          1.916 m Patient Age:    67 years          BP:           134/82 mmHg  Patient Gender: M                 HR:           90 bpm. Exam Location:  Inpatient Procedure: 2D Echo, Cardiac Doppler, Color Doppler and Intracardiac            Opacification Agent Indications:    Heart Block, Complete  History:        Patient has prior history of Echocardiogram examinations, most                 recent 01/07/2023. Arrythmias:Atrial Fibrillation; Risk                 Factors:Hypertension, Diabetes and Dyslipidemia.  Sonographer:    Karma Ganja Referring Phys: 62 Torin W HARDING  Sonographer Comments: Technically challenging study due to limited acoustic windows. IMPRESSIONS  1. Cannot fully assess wall motion. Left ventricular ejection fraction, by estimation, is 60 to 65%. The left ventricle has normal function. There is moderate concentric left ventricular hypertrophy. Left ventricular diastolic parameters are indeterminate.  2. Right ventricular systolic function is mildly reduced. The right ventricular size is mildly enlarged. Tricuspid regurgitation signal is inadequate for assessing PA pressure.  3. Left atrial size was moderately dilated.  4. Right atrial size was mildly dilated.  5. The mitral valve was not well visualized. No evidence of mitral valve regurgitation.  6. The aortic valve was not well visualized. Aortic valve regurgitation is not visualized.  7. The inferior vena cava is dilated in size with >50% respiratory variability, suggesting right atrial pressure of 8 mmHg. FINDINGS  Left Ventricle: Cannot fully assess wall motion. Left ventricular ejection fraction, by estimation, is 60 to 65%. The left ventricle has normal function. Definity contrast agent was given IV to delineate the left ventricular endocardial borders. The left ventricular internal cavity size was normal in size. There is moderate concentric left ventricular hypertrophy. Left ventricular diastolic parameters are indeterminate. Right Ventricle: The right ventricular size is mildly enlarged. Right ventricular  systolic function is mildly reduced. Tricuspid regurgitation signal is inadequate for assessing PA pressure. Left Atrium: Left atrial size was moderately dilated. Right Atrium: Right atrial size was mildly dilated. Pericardium: There is no evidence of pericardial effusion. Mitral Valve: The mitral valve was not well  visualized. No evidence of mitral valve regurgitation. Tricuspid Valve: Tricuspid valve regurgitation is not demonstrated. Aortic Valve: The aortic valve was not well visualized. Aortic valve regurgitation is not visualized. Aortic valve mean gradient measures 1.7 mmHg. Aortic valve peak gradient measures 3.1 mmHg. Aortic valve area, by VTI measures 2.82 cm. Pulmonic Valve: Pulmonic valve regurgitation is not visualized. Aorta: The aortic root and ascending aorta are structurally normal, with no evidence of dilitation. Venous: The inferior vena cava is dilated in size with greater than 50% respiratory variability, suggesting right atrial pressure of 8 mmHg. IAS/Shunts: The interatrial septum was not well visualized.  LEFT VENTRICLE PLAX 2D LVIDd:         4.00 cm   Diastology LVIDs:         2.40 cm   LV e' medial:    4.93 cm/s LV PW:         1.30 cm   LV E/e' medial:  17.6 LV IVS:        1.30 cm   LV e' lateral:   7.29 cm/s LVOT diam:     2.00 cm   LV E/e' lateral: 11.9 LV SV:         43 LV SV Index:   22 LVOT Area:     3.14 cm  RIGHT VENTRICLE            IVC RV Basal diam:  4.30 cm    IVC diam: 2.60 cm RV S prime:     6.56 cm/s TAPSE (M-mode): 1.6 cm LEFT ATRIUM              Index        RIGHT ATRIUM           Index LA diam:        4.30 cm  2.24 cm/m   RA Area:     24.10 cm LA Vol (A2C):   68.1 ml  35.54 ml/m  RA Volume:   76.00 ml  39.66 ml/m LA Vol (A4C):   105.0 ml 54.79 ml/m LA Biplane Vol: 86.1 ml  44.93 ml/m  AORTIC VALVE AV Area (Vmax):    2.38 cm AV Area (Vmean):   2.40 cm AV Area (VTI):     2.82 cm AV Vmax:           88.00 cm/s AV Vmean:          60.667 cm/s AV VTI:            0.153 m  AV Peak Grad:      3.1 mmHg AV Mean Grad:      1.7 mmHg LVOT Vmax:         66.63 cm/s LVOT Vmean:        46.433 cm/s LVOT VTI:          0.137 m LVOT/AV VTI ratio: 0.90  AORTA Ao Root diam: 3.40 cm Ao Asc diam:  3.30 cm MITRAL VALVE MV Area (PHT): 3.92 cm    SHUNTS MV Decel Time: 194 msec    Systemic VTI:  0.14 m MV E velocity: 86.80 cm/s  Systemic Diam: 2.00 cm Carolan Clines Electronically signed by Carolan Clines Signature Date/Time: 02/27/2023/12:35:35 PM    Final    DG Chest Port 1 View Result Date: 02/26/2023 CLINICAL DATA:  Complete heart block EXAM: PORTABLE CHEST 1 VIEW COMPARISON:  Chest x-ray 02/26/2023 FINDINGS: The cardiomediastinal silhouette is stable, the heart is enlarged. There central pulmonary vascular congestion. Small left pleural effusion persists.  No pneumothorax or acute fracture. IMPRESSION: 1. Cardiomegaly with central pulmonary vascular congestion. 2. Small left pleural effusion. Electronically Signed   By: Darliss Cheney M.D.   On: 02/26/2023 18:58   CARDIAC CATHETERIZATION Result Date: 02/26/2023   Anticipated discharge date to be determined.   No indication for antiplatelet therapy at this time . Successful Temporary Pacemaker Placement via RFV 6 Fr Sheath -> 62 cm, Rate 80 bpm, 5 mA (threshold 2 mA) The patient will be transported to the CVICU.  Will use right knee immobilizer as well as bilateral wrist and right ankle restraints to avoid moving the pacemaker. Chest x-ray ordered to evaluate line positioning. [name], MD  CT HEAD WO CONTRAST ( ) Result Date: 02/26/2023 CLINICAL DATA:  Mental status change, unknown cause EXAM: CT HEAD WITHOUT CONTRAST TECHNIQUE: Contiguous axial images were obtained from the base of the skull through the vertex without intravenous contrast. RADIATION DOSE REDUCTION: This exam was performed according to the departmental dose-optimization program which includes automated exposure control, adjustment of the mA and/or kV according to patient size  and/or use of iterative reconstruction technique. COMPARISON:  CT head and MRI head January 07, 2023. FINDINGS: Brain: Similar remote bilateral parieto-occipital infarcts. Remote left basal ganglia infarct. Advanced patchy white matter hypodensities, compatible with chronic microvascular ischemic disease. Vascular: Calcific atherosclerosis. No hyperdense vessel identified. Skull: No acute fracture. Sinuses/Orbits: Clear sinuses.  No acute orbital findings. Other: Right mastoid effusion, similar. IMPRESSION: 1. No evidence of acute intracranial abnormality. MRI could provide more sensitive evaluation for acute infarct if clinically warranted. 2. Remote infarcts and advanced chronic microvascular ischemic change. 3. Chronic right mastoid effusion. Electronically Signed   By: Feliberto Harts M.D.   On: 02/26/2023 17:29   DG Chest Portable 1 View Result Date: 02/26/2023 CLINICAL DATA:  Bradycardia, hypotension. EXAM: PORTABLE CHEST 1 VIEW COMPARISON:  Chest x-ray 01/07/2023 FINDINGS: Heart is enlarged, unchanged. Evaluation of the left lower lung is obscured by overlying external artifact. There is likely a small left pleural effusion. Right lung is clear. No pneumothorax visualized. No acute osseous abnormality. IMPRESSION: 1. Evaluation of the left lower lung is obscured by overlying external artifact. There is likely a small left pleural effusion. 2. Cardiomegaly. Electronically Signed   By: Darliss Cheney M.D.   On: 02/26/2023 16:16      Subjective: Patient seen and examined at bedside today.  Hemodynamically stable.  Comfortable ,lying in bed.  Denies shortness of breath or chest pain.  Heart rate in the range of upper 90s.  Denies any new problems today.  Medically stable for discharge.  Discharge Exam: Vitals:   03/07/23 0740 03/07/23 0957  BP: 115/74 115/74  Pulse:  93  Resp: 16   Temp: 97.8 F (36.6 C)   SpO2: 97%    Vitals:   03/07/23 0010 03/07/23 0417 03/07/23 0740 03/07/23 0957  BP:  127/85 (!) 141/98 115/74 115/74  Pulse:  96  93  Resp: 20 17 16    Temp: 97.8 F (36.6 C) 97.8 F (36.6 C) 97.8 F (36.6 C)   TempSrc: Oral Oral Oral   SpO2:  97% 97%   Weight:  70.6 kg    Height:        General: Pt is alert, awake, not in acute distress Cardiovascular: Irregular irregular rhythm, no rubs, no gallops Respiratory: CTA bilaterally, no wheezing, no rhonchi Abdominal: Soft, NT, ND, bowel sounds + Extremities: no edema, no cyanosis    The results of significant diagnostics from this hospitalization (  including imaging, microbiology, ancillary and laboratory) are listed below for reference.     Microbiology: Recent Results (from the past 240 hours)  MRSA Next Gen by PCR, Nasal     Status: None   Collection Time: 02/26/23  7:02 PM   Specimen: Urine, Clean Catch; Nasal Swab  Result Value Ref Range Status   MRSA by PCR Next Gen NOT DETECTED NOT DETECTED Final    Comment: (NOTE) The GeneXpert MRSA Assay (FDA approved for NASAL specimens only), is one component of a comprehensive MRSA colonization surveillance program. It is not intended to diagnose MRSA infection nor to guide or monitor treatment for MRSA infections. Test performance is not FDA approved in patients less than 75 years old. Performed at [facility], [address] [place], [place] [address]      Labs: BNP (last 3 results) Recent Labs    10/16/22 1146 02/26/23 1853  BNP 309* 303.0*   Basic Metabolic Panel: Recent Labs  Lab 03/01/23 0835 03/02/23 0337 03/03/23 0218 03/04/23 0223 03/05/23 0215 03/06/23 0234 03/07/23 0226  NA 141 139 136 133* 135 135 132*  K 4.1 4.0 3.6 4.1 4.0 4.1 4.1  CL 103 103 102 100 103 100 104  CO2 24 27 26 25 24 23 24   GLUCOSE 73 203* 122* 143* 133* 146* 154*  BUN 39* 36* 28* 28* 26* 29* 32*  CREATININE 1.85* 1.81* 1.66* 1.98* 1.73* 1.79* 1.70*  CALCIUM 8.7* 8.5* 8.5* 8.3* 8.5* 8.5* 8.2*  MG 2.4 2.4  --   --   --   --   --   PHOS 3.9 3.1 3.3  --   --    --   --    Liver Function Tests: Recent Labs  Lab 03/02/23 0337 03/03/23 0218  ALBUMIN 2.6* 2.7*   No results for input(s): "LIPASE", "AMYLASE" in the last 168 hours. No results for input(s): "AMMONIA" in the last 168 hours. CBC: Recent Labs  Lab 03/01/23 0835 03/02/23 0337 03/03/23 0218 03/04/23 0223 03/07/23 0226  WBC 3.7* 3.2* 2.5* 2.6* 4.1  HGB 9.8* 9.0* 8.6* 8.5* 9.5*  HCT 30.2* 27.4* 26.5* 26.0* 29.2*  MCV 89.9 87.5 87.5 88.4 90.7  PLT 177 155 140* 144* 148*   Cardiac Enzymes: No results for input(s): "CKTOTAL", "CKMB", "CKMBINDEX", "TROPONINI" in the last 168 hours. BNP: Invalid input(s): "POCBNP" CBG: Recent Labs  Lab 03/06/23 0631 03/06/23 1303 03/06/23 1617 03/06/23 2048 03/07/23 0601  GLUCAP 138* 151* 102* 132* 128*   D-Dimer No results for input(s): "DDIMER" in the last 72 hours. Hgb A1c No results for input(s): "HGBA1C" in the last 72 hours. Lipid Profile No results for input(s): "CHOL", "HDL", "LDLCALC", "TRIG", "CHOLHDL", "LDLDIRECT" in the last 72 hours. Thyroid function studies No results for input(s): "TSH", "T4TOTAL", "T3FREE", "THYROIDAB" in the last 72 hours.  Invalid input(s): "FREET3" Anemia work up No results for input(s): "VITAMINB12", "FOLATE", "FERRITIN", "TIBC", "IRON", "RETICCTPCT" in the last 72 hours. Urinalysis    Component Value Date/Time   COLORURINE YELLOW 02/26/2023 1839   APPEARANCEUR CLEAR 02/26/2023 1839   LABSPEC 1.009 02/26/2023 1839   PHURINE 5.0 02/26/2023 1839   GLUCOSEU >=500 (A) 02/26/2023 1839   HGBUR NEGATIVE 02/26/2023 1839   BILIRUBINUR NEGATIVE 02/26/2023 1839   KETONESUR NEGATIVE 02/26/2023 1839   PROTEINUR NEGATIVE 02/26/2023 1839   UROBILINOGEN 0.2 01/17/2013 0623   NITRITE NEGATIVE 02/26/2023 1839   LEUKOCYTESUR NEGATIVE 02/26/2023 1839   Sepsis Labs Recent Labs  Lab 03/02/23 3235 03/03/23 0218 03/04/23 5732  03/07/23 0226  WBC 3.2* 2.5* 2.6* 4.1   Microbiology Recent Results (from the  past 240 hours)  MRSA Next Gen by PCR, Nasal     Status: None   Collection Time: 02/26/23  7:02 PM   Specimen: Urine, Clean Catch; Nasal Swab  Result Value Ref Range Status   MRSA by PCR Next Gen NOT DETECTED NOT DETECTED Final    Comment: (NOTE) The GeneXpert MRSA Assay (FDA approved for NASAL specimens only), is one component of a comprehensive MRSA colonization surveillance program. It is not intended to diagnose MRSA infection nor to guide or monitor treatment for MRSA infections. Test performance is not FDA approved in patients less than 35 years old. Performed at [facility], [address], [place], [place] [address]     Please note: You were cared for by a hospitalist during your hospital stay. Once you are discharged, your primary care physician will handle any further medical issues. Please note that NO REFILLS for any discharge medications will be authorized once you are discharged, as it is imperative that you return to your primary care physician (or establish a relationship with a primary care physician if you do not have one) for your post hospital discharge needs so that they can reassess your need for medications and monitor your lab values.    Time coordinating discharge: 40 minutes  SIGNED:   Burnadette Pop, MD  [facility] 03/07/2023, 11:10 AM Pager [phone]  If 7PM-7AM, please contact night-coverage [email or web address] Password [email or web address]

## 2023-03-07 NOTE — Plan of Care (Signed)
Problem: Education: Goal: Ability to describe self-care measures that may prevent or decrease complications (Diabetes Survival Skills Education) will improve Outcome: Adequate for Discharge   Problem: Coping: Goal: Ability to adjust to condition or change in health will improve 03/07/2023 1047 by Garth Bigness, RN Outcome: Adequate for Discharge 03/07/2023 0917 by Garth Bigness, RN Outcome: Progressing   Problem: Fluid Volume: Goal: Ability to maintain a balanced intake and output will improve Outcome: Adequate for Discharge   Problem: Health Behavior/Discharge Planning: Goal: Ability to identify and utilize available resources and services will improve Outcome: Adequate for Discharge Goal: Ability to manage health-related needs will improve Outcome: Adequate for Discharge   Problem: Metabolic: Goal: Ability to maintain appropriate glucose levels will improve Outcome: Adequate for Discharge   Problem: Nutritional: Goal: Maintenance of adequate nutrition will improve 03/07/2023 1047 by Garth Bigness, RN Outcome: Adequate for Discharge 03/07/2023 0917 by Garth Bigness, RN Outcome: Progressing Goal: Progress toward achieving an optimal weight will improve Outcome: Adequate for Discharge   Problem: Skin Integrity: Goal: Risk for impaired skin integrity will decrease 03/07/2023 1047 by Garth Bigness, RN Outcome: Adequate for Discharge 03/07/2023 0917 by Garth Bigness, RN Outcome: Progressing   Problem: Tissue Perfusion: Goal: Adequacy of tissue perfusion will improve Outcome: Adequate for Discharge   Problem: Education: Goal: Knowledge of General Education information will improve Description: Including pain rating scale, medication(s)/side effects and non-pharmacologic comfort measures Outcome: Adequate for Discharge   Problem: Health Behavior/Discharge Planning: Goal: Ability to manage health-related needs will improve Outcome: Adequate for  Discharge   Problem: Clinical Measurements: Goal: Ability to maintain clinical measurements within normal limits will improve Outcome: Adequate for Discharge Goal: Will remain free from infection 03/07/2023 1047 by Garth Bigness, RN Outcome: Adequate for Discharge 03/07/2023 0917 by Garth Bigness, RN Outcome: Progressing Goal: Diagnostic test results will improve Outcome: Adequate for Discharge Goal: Respiratory complications will improve 03/07/2023 1047 by Garth Bigness, RN Outcome: Adequate for Discharge 03/07/2023 0917 by Garth Bigness, RN Outcome: Progressing Goal: Cardiovascular complication will be avoided 03/07/2023 1047 by Garth Bigness, RN Outcome: Adequate for Discharge 03/07/2023 0917 by Garth Bigness, RN Outcome: Progressing   Problem: Activity: Goal: Risk for activity intolerance will decrease 03/07/2023 1047 by Garth Bigness, RN Outcome: Adequate for Discharge 03/07/2023 0917 by Garth Bigness, RN Outcome: Progressing   Problem: Nutrition: Goal: Adequate nutrition will be maintained 03/07/2023 1047 by Garth Bigness, RN Outcome: Adequate for Discharge 03/07/2023 0917 by Garth Bigness, RN Outcome: Progressing   Problem: Coping: Goal: Level of anxiety will decrease 03/07/2023 1047 by Garth Bigness, RN Outcome: Adequate for Discharge 03/07/2023 0917 by Garth Bigness, RN Outcome: Progressing   Problem: Elimination: Goal: Will not experience complications related to bowel motility 03/07/2023 1047 by Garth Bigness, RN Outcome: Adequate for Discharge 03/07/2023 0917 by Garth Bigness, RN Outcome: Progressing Goal: Will not experience complications related to urinary retention 03/07/2023 1047 by Garth Bigness, RN Outcome: Adequate for Discharge 03/07/2023 0917 by Garth Bigness, RN Outcome: Progressing   Problem: Pain Management: Goal: General experience of comfort will improve 03/07/2023 1047 by Garth Bigness, RN Outcome: Adequate for Discharge 03/07/2023 0917 by Garth Bigness, RN Outcome: Progressing   Problem: Safety: Goal: Ability to remain free from injury will improve 03/07/2023 1047 by Garth Bigness, RN Outcome: Adequate for Discharge 03/07/2023 0917 by Garth Bigness, RN Outcome: Progressing   Problem: Skin  Integrity: Goal: Risk for impaired skin integrity will decrease 03/07/2023 1047 by Garth Bigness, RN Outcome: Adequate for Discharge 03/07/2023 0917 by Garth Bigness, RN Outcome: Progressing   Problem: Education: Goal: Understanding of CV disease, CV risk reduction, and recovery process will improve Outcome: Adequate for Discharge   Problem: Activity: Goal: Ability to return to baseline activity level will improve Outcome: Adequate for Discharge   Problem: Cardiovascular: Goal: Ability to achieve and maintain adequate cardiovascular perfusion will improve Outcome: Adequate for Discharge Goal: Vascular access site(s) Level 0-1 will be maintained Outcome: Adequate for Discharge   Problem: Health Behavior/Discharge Planning: Goal: Ability to safely manage health-related needs after discharge will improve Outcome: Adequate for Discharge

## 2023-03-07 NOTE — TOC Transition Note (Signed)
Transition of Care [facility]) - Discharge Note   Patient Details  Name: Brian [name] MRN: [id number] Date of Birth: January 21, 1956  [facility]) CM/SW Contact:  Marliss Coots, [name] Phone Number: 03/07/2023, 11:26 AM   Clinical Narrative:     Patient will DC to: [facility] Anticipated DC date: 03/07/2023 Family notified: Pershing Proud; Daughter; [phone] Transport by: Pershing Proud; Daughter; [phone]   Per MD patient ready for DC to [facility]. RN to call report prior to discharge [phone]). RN, patient, patient's family, and facility notified of DC. Discharge Summary and FL2 sent to facility. DC packet on chart. Patient's daughter, Lowanda Foster, is to provide transportation for discharge.  CSW will sign off for now as social work intervention is no longer needed. Please consult [name] again if new needs arise.    Final next level of care: Skilled Nursing Facility Barriers to Discharge: Barriers Resolved   Patient Goals and CMS Choice Patient states their goals for this hospitalization and ongoing recovery are:: [facility]   Choice offered to / list presented to : Adult Children, Sibling [place] ownership interest in [facility].provided to:: Sibling    Discharge Placement              Patient chooses bed at: [facility] Patient to be transferred to facility by: Pershing Proud; Daughter; [phone] Name of family member notified: Pershing Proud; Daughter; [phone] Patient and family notified of of transfer: 03/07/23  Discharge Plan and Services Additional resources added to the After Visit Summary for   In-house Referral: Clinical Social Work   Post Acute Care Choice: Skilled Nursing Facility                               Social Drivers of Health (SDOH) Interventions SDOH Screenings   Food Insecurity: No Food Insecurity (03/01/2023)  Housing: Low Risk   (03/01/2023)  Transportation Needs: Unmet Transportation Needs (03/01/2023)  Utilities: Not At Risk (03/01/2023)  Alcohol Screen: Low Risk  (11/14/2021)  Depression (PHQ2-9): Low Risk  (04/05/2022)  Financial Resource Strain: Low Risk  (04/05/2022)  Physical Activity: Inactive (04/05/2022)  Social Connections: Socially Isolated (03/01/2023)  Stress: No Stress Concern Present (04/05/2022)  Tobacco Use: Low Risk  (03/01/2023)     Readmission Risk Interventions    11/08/2021    9:46 AM  Readmission Risk Prevention Plan  Transportation Screening Complete  PCP or Specialist Appt within 3-5 Days Complete  HRI or Home Care Consult Complete  Social Work Consult for Recovery Care Planning/Counseling Complete  Palliative Care Screening Not Applicable  Medication Review [employer]) Complete

## 2023-03-07 NOTE — Progress Notes (Signed)
Progress Note  Patient Name: Brian [name] Date of Encounter: 03/07/2023  Primary Cardiologist:   Rollene Rotunda, MD   Subjective   He is doing well.  No chest pain.  No SOB.    Ambulated.  HR does go up with ambulation but is controlled at rest.   Inpatient Medications    Scheduled Meds:  apixaban  5 mg Oral BID   atorvastatin  20 mg Oral Daily   diltiazem  180 mg Oral Daily   feeding supplement  1 Container Oral TID BM   insulin aspart  0-5 Units Subcutaneous QHS   insulin aspart  0-9 Units Subcutaneous TID WC   levETIRAcetam  500 mg Oral BID   metoprolol tartrate  25 mg Oral BID   mouth rinse  15 mL Mouth Rinse 4 times per day   pantoprazole  40 mg Oral BID AC   sodium chloride flush  3 mL Intravenous Q12H   Continuous Infusions:   PRN Meds: hydrALAZINE, metoprolol tartrate, ondansetron (ZOFRAN) IV, mouth rinse, sodium chloride flush   Vital Signs    Vitals:   03/06/23 1113 03/06/23 1359 03/07/23 0010 03/07/23 0417  BP: 122/83 121/81 127/85 (!) 141/98  Pulse: 97 (!) 110  96  Resp: (!) 22 20 20 17   Temp: 98.2 F (36.8 C) 98 F (36.7 C) 97.8 F (36.6 C) 97.8 F (36.6 C)  TempSrc: Oral Oral Oral Oral  SpO2:  98%  97%  Weight:    70.6 kg  Height:        Intake/Output Summary (Last 24 hours) at 03/07/2023 0715 Last data filed at 03/07/2023 0418 Gross per 24 hour  Intake 1220 ml  Output 1925 ml  Net -705 ml   Filed Weights   03/05/23 0635 03/06/23 0500 03/07/23 0417  Weight: 70.6 kg 70.7 kg 70.6 kg    Telemetry    Atrial fib with rapid rate - Personally Reviewed  ECG    NA - Personally Reviewed  Physical Exam   GEN: No  acute distress.   Neck: No  JVD Cardiac: Irregular RR, no murmurs, rubs, or gallops.  Respiratory:    Few basilar crackles.  GI: Soft, nontender, non-distended, normal bowel sounds  MS:  No  edema; No deformity. Neuro:   Nonfocal  Psych: Oriented and appropriate    Labs    Chemistry Recent Labs  Lab  03/02/23 0337 03/03/23 0218 03/04/23 0223 03/05/23 0215 03/06/23 0234 03/07/23 0226  NA 139 136   < > 135 135 132*  K 4.0 3.6   < > 4.0 4.1 4.1  CL 103 102   < > 103 100 104  CO2 27 26   < > 24 23 24   GLUCOSE 203* 122*   < > 133* 146* 154*  BUN 36* 28*   < > 26* 29* 32*  CREATININE 1.81* 1.66*   < > 1.73* 1.79* 1.70*  CALCIUM 8.5* 8.5*   < > 8.5* 8.5* 8.2*  ALBUMIN 2.6* 2.7*  --   --   --   --   GFRNONAA 40* 44*   < > 42* 41* 43*  ANIONGAP 9 8   < > 8 12 4*   < > = values in this interval not displayed.     Hematology Recent Labs  Lab 03/03/23 0218 03/04/23 0223 03/07/23 0226  WBC 2.5* 2.6* 4.1  RBC 3.03* 2.94* 3.22*  HGB 8.6* 8.5* 9.5*  HCT 26.5* 26.0* 29.2*  MCV 87.5 88.4  90.7  MCH 28.4 28.9 29.5  MCHC 32.5 32.7 32.5  RDW 15.9* 15.9* 17.0*  PLT 140* 144* 148*    Cardiac EnzymesNo results for input(s): "TROPONINI" in the last 168 hours. No results for input(s): "TROPIPOC" in the last 168 hours.   BNP No results for input(s): "BNP", "PROBNP" in the last 168 hours.    DDimer  No results for input(s): "DDIMER" in the last 168 hours.    Radiology    No results found.  Cardiac Studies   Echo  1. Cannot fully assess wall motion. Left ventricular ejection fraction,  by estimation, is 60 to 65%. The left ventricle has normal function. There  is moderate concentric left ventricular hypertrophy. Left ventricular  diastolic parameters are  indeterminate.   2. Right ventricular systolic function is mildly reduced. The right  ventricular size is mildly enlarged. Tricuspid regurgitation signal is  inadequate for assessing PA pressure.   3. Left atrial size was moderately dilated.   4. Right atrial size was mildly dilated.   5. The mitral valve was not well visualized. No evidence of mitral valve  regurgitation.   6. The aortic valve was not well visualized. Aortic valve regurgitation  is not visualized.   7. The inferior vena cava is dilated in size with >50%  respiratory  variability, suggesting right atrial pressure of 8 mmHg.   Patient Profile     68 y.o. male history of AF, prior strokes with hx of conduction disease.   Assessment & Plan   CHB:   Resolved.  We will send him a 3 day monitor to follow up for this and atrial fib with RVR. Marland Kitchen    Atrial flutter:   Resumed Eliquis. Added low dose beta blocker and increased .   Held [place].   HR is better.     Continue current meds and we will follow as above.   GI bleed:   Resumed Eliquis. Hgb stable.      HTN:    This is being managed on the context of treating his heart rate.   AKI:   Creat has slowly trended down.  Follow up per primary team.   For questions or updates, please contact [facility] Please consult [email or web address] for contact info under Cardiology/STEMI.   Signed, Rollene Rotunda, MD  03/07/2023, 7:15 AM

## 2023-03-07 NOTE — Progress Notes (Signed)
.  zio

## 2023-03-07 NOTE — Plan of Care (Signed)
  Problem: Coping: Goal: Ability to adjust to condition or change in health will improve Outcome: Progressing   Problem: Nutritional: Goal: Maintenance of adequate nutrition will improve Outcome: Progressing   Problem: Skin Integrity: Goal: Risk for impaired skin integrity will decrease Outcome: Progressing   Problem: Clinical Measurements: Goal: Will remain free from infection Outcome: Progressing Goal: Respiratory complications will improve Outcome: Progressing Goal: Cardiovascular complication will be avoided Outcome: Progressing   Problem: Activity: Goal: Risk for activity intolerance will decrease Outcome: Progressing   Problem: Nutrition: Goal: Adequate nutrition will be maintained Outcome: Progressing   Problem: Coping: Goal: Level of anxiety will decrease Outcome: Progressing   Problem: Elimination: Goal: Will not experience complications related to bowel motility Outcome: Progressing Goal: Will not experience complications related to urinary retention Outcome: Progressing   Problem: Pain Management: Goal: General experience of comfort will improve Outcome: Progressing   Problem: Safety: Goal: Ability to remain free from injury will improve Outcome: Progressing   Problem: Skin Integrity: Goal: Risk for impaired skin integrity will decrease Outcome: Progressing

## 2023-03-08 DIAGNOSIS — I5030 Unspecified diastolic (congestive) heart failure: Secondary | ICD-10-CM | POA: Diagnosis not present

## 2023-03-08 DIAGNOSIS — I5020 Unspecified systolic (congestive) heart failure: Secondary | ICD-10-CM | POA: Diagnosis not present

## 2023-03-08 DIAGNOSIS — M6259 Muscle wasting and atrophy, not elsewhere classified, multiple sites: Secondary | ICD-10-CM | POA: Diagnosis not present

## 2023-03-08 DIAGNOSIS — I4891 Unspecified atrial fibrillation: Secondary | ICD-10-CM | POA: Diagnosis not present

## 2023-03-08 DIAGNOSIS — R41841 Cognitive communication deficit: Secondary | ICD-10-CM | POA: Diagnosis not present

## 2023-03-08 DIAGNOSIS — Z8673 Personal history of transient ischemic attack (TIA), and cerebral infarction without residual deficits: Secondary | ICD-10-CM | POA: Diagnosis not present

## 2023-03-08 DIAGNOSIS — N183 Chronic kidney disease, stage 3 unspecified: Secondary | ICD-10-CM | POA: Diagnosis not present

## 2023-03-08 DIAGNOSIS — G4089 Other seizures: Secondary | ICD-10-CM | POA: Diagnosis not present

## 2023-03-08 DIAGNOSIS — I959 Hypotension, unspecified: Secondary | ICD-10-CM | POA: Diagnosis not present

## 2023-03-08 DIAGNOSIS — Z741 Need for assistance with personal care: Secondary | ICD-10-CM | POA: Diagnosis not present

## 2023-03-08 DIAGNOSIS — R1312 Dysphagia, oropharyngeal phase: Secondary | ICD-10-CM | POA: Diagnosis not present

## 2023-03-08 DIAGNOSIS — I480 Paroxysmal atrial fibrillation: Secondary | ICD-10-CM | POA: Diagnosis not present

## 2023-03-08 DIAGNOSIS — I69354 Hemiplegia and hemiparesis following cerebral infarction affecting left non-dominant side: Secondary | ICD-10-CM | POA: Diagnosis not present

## 2023-03-08 DIAGNOSIS — I442 Atrioventricular block, complete: Secondary | ICD-10-CM | POA: Diagnosis not present

## 2023-03-08 DIAGNOSIS — F322 Major depressive disorder, single episode, severe without psychotic features: Secondary | ICD-10-CM | POA: Diagnosis not present

## 2023-03-08 DIAGNOSIS — E119 Type 2 diabetes mellitus without complications: Secondary | ICD-10-CM | POA: Diagnosis not present

## 2023-03-08 DIAGNOSIS — G40909 Epilepsy, unspecified, not intractable, without status epilepticus: Secondary | ICD-10-CM | POA: Diagnosis not present

## 2023-03-08 DIAGNOSIS — R262 Difficulty in walking, not elsewhere classified: Secondary | ICD-10-CM | POA: Diagnosis not present

## 2023-03-10 DIAGNOSIS — G40909 Epilepsy, unspecified, not intractable, without status epilepticus: Secondary | ICD-10-CM | POA: Diagnosis not present

## 2023-03-10 DIAGNOSIS — Z741 Need for assistance with personal care: Secondary | ICD-10-CM | POA: Diagnosis not present

## 2023-03-10 DIAGNOSIS — M6259 Muscle wasting and atrophy, not elsewhere classified, multiple sites: Secondary | ICD-10-CM | POA: Diagnosis not present

## 2023-03-10 DIAGNOSIS — R41841 Cognitive communication deficit: Secondary | ICD-10-CM | POA: Diagnosis not present

## 2023-03-10 DIAGNOSIS — G4089 Other seizures: Secondary | ICD-10-CM | POA: Diagnosis not present

## 2023-03-10 DIAGNOSIS — I480 Paroxysmal atrial fibrillation: Secondary | ICD-10-CM | POA: Diagnosis not present

## 2023-03-10 DIAGNOSIS — I69354 Hemiplegia and hemiparesis following cerebral infarction affecting left non-dominant side: Secondary | ICD-10-CM | POA: Diagnosis not present

## 2023-03-10 DIAGNOSIS — R262 Difficulty in walking, not elsewhere classified: Secondary | ICD-10-CM | POA: Diagnosis not present

## 2023-03-10 DIAGNOSIS — R1312 Dysphagia, oropharyngeal phase: Secondary | ICD-10-CM | POA: Diagnosis not present

## 2023-03-11 DIAGNOSIS — G40909 Epilepsy, unspecified, not intractable, without status epilepticus: Secondary | ICD-10-CM | POA: Diagnosis not present

## 2023-03-11 DIAGNOSIS — I69354 Hemiplegia and hemiparesis following cerebral infarction affecting left non-dominant side: Secondary | ICD-10-CM | POA: Diagnosis not present

## 2023-03-11 DIAGNOSIS — G4089 Other seizures: Secondary | ICD-10-CM | POA: Diagnosis not present

## 2023-03-11 DIAGNOSIS — R1312 Dysphagia, oropharyngeal phase: Secondary | ICD-10-CM | POA: Diagnosis not present

## 2023-03-11 DIAGNOSIS — R262 Difficulty in walking, not elsewhere classified: Secondary | ICD-10-CM | POA: Diagnosis not present

## 2023-03-11 DIAGNOSIS — R41841 Cognitive communication deficit: Secondary | ICD-10-CM | POA: Diagnosis not present

## 2023-03-11 DIAGNOSIS — I480 Paroxysmal atrial fibrillation: Secondary | ICD-10-CM | POA: Diagnosis not present

## 2023-03-11 DIAGNOSIS — M6259 Muscle wasting and atrophy, not elsewhere classified, multiple sites: Secondary | ICD-10-CM | POA: Diagnosis not present

## 2023-03-11 DIAGNOSIS — Z741 Need for assistance with personal care: Secondary | ICD-10-CM | POA: Diagnosis not present

## 2023-03-12 DIAGNOSIS — I4891 Unspecified atrial fibrillation: Secondary | ICD-10-CM | POA: Diagnosis not present

## 2023-03-12 DIAGNOSIS — R41841 Cognitive communication deficit: Secondary | ICD-10-CM | POA: Diagnosis not present

## 2023-03-12 DIAGNOSIS — I69354 Hemiplegia and hemiparesis following cerebral infarction affecting left non-dominant side: Secondary | ICD-10-CM | POA: Diagnosis not present

## 2023-03-12 DIAGNOSIS — F322 Major depressive disorder, single episode, severe without psychotic features: Secondary | ICD-10-CM | POA: Diagnosis not present

## 2023-03-12 DIAGNOSIS — M6259 Muscle wasting and atrophy, not elsewhere classified, multiple sites: Secondary | ICD-10-CM | POA: Diagnosis not present

## 2023-03-12 DIAGNOSIS — I480 Paroxysmal atrial fibrillation: Secondary | ICD-10-CM | POA: Diagnosis not present

## 2023-03-12 DIAGNOSIS — N183 Chronic kidney disease, stage 3 unspecified: Secondary | ICD-10-CM | POA: Diagnosis not present

## 2023-03-12 DIAGNOSIS — I5030 Unspecified diastolic (congestive) heart failure: Secondary | ICD-10-CM | POA: Diagnosis not present

## 2023-03-12 DIAGNOSIS — R262 Difficulty in walking, not elsewhere classified: Secondary | ICD-10-CM | POA: Diagnosis not present

## 2023-03-12 DIAGNOSIS — I442 Atrioventricular block, complete: Secondary | ICD-10-CM | POA: Diagnosis not present

## 2023-03-12 DIAGNOSIS — Z741 Need for assistance with personal care: Secondary | ICD-10-CM | POA: Diagnosis not present

## 2023-03-12 DIAGNOSIS — G40909 Epilepsy, unspecified, not intractable, without status epilepticus: Secondary | ICD-10-CM | POA: Diagnosis not present

## 2023-03-12 DIAGNOSIS — R1312 Dysphagia, oropharyngeal phase: Secondary | ICD-10-CM | POA: Diagnosis not present

## 2023-03-12 DIAGNOSIS — G4089 Other seizures: Secondary | ICD-10-CM | POA: Diagnosis not present

## 2023-03-12 DIAGNOSIS — I959 Hypotension, unspecified: Secondary | ICD-10-CM | POA: Diagnosis not present

## 2023-03-12 DIAGNOSIS — I5020 Unspecified systolic (congestive) heart failure: Secondary | ICD-10-CM | POA: Diagnosis not present

## 2023-03-12 DIAGNOSIS — E119 Type 2 diabetes mellitus without complications: Secondary | ICD-10-CM | POA: Diagnosis not present

## 2023-03-13 DIAGNOSIS — I959 Hypotension, unspecified: Secondary | ICD-10-CM | POA: Diagnosis not present

## 2023-03-13 DIAGNOSIS — I5030 Unspecified diastolic (congestive) heart failure: Secondary | ICD-10-CM | POA: Diagnosis not present

## 2023-03-13 DIAGNOSIS — F322 Major depressive disorder, single episode, severe without psychotic features: Secondary | ICD-10-CM | POA: Diagnosis not present

## 2023-03-13 DIAGNOSIS — G40909 Epilepsy, unspecified, not intractable, without status epilepticus: Secondary | ICD-10-CM | POA: Diagnosis not present

## 2023-03-13 DIAGNOSIS — I4891 Unspecified atrial fibrillation: Secondary | ICD-10-CM | POA: Diagnosis not present

## 2023-03-13 DIAGNOSIS — N183 Chronic kidney disease, stage 3 unspecified: Secondary | ICD-10-CM | POA: Diagnosis not present

## 2023-03-13 DIAGNOSIS — R262 Difficulty in walking, not elsewhere classified: Secondary | ICD-10-CM | POA: Diagnosis not present

## 2023-03-13 DIAGNOSIS — I69354 Hemiplegia and hemiparesis following cerebral infarction affecting left non-dominant side: Secondary | ICD-10-CM | POA: Diagnosis not present

## 2023-03-13 DIAGNOSIS — E119 Type 2 diabetes mellitus without complications: Secondary | ICD-10-CM | POA: Diagnosis not present

## 2023-03-13 DIAGNOSIS — R41841 Cognitive communication deficit: Secondary | ICD-10-CM | POA: Diagnosis not present

## 2023-03-13 DIAGNOSIS — G4089 Other seizures: Secondary | ICD-10-CM | POA: Diagnosis not present

## 2023-03-13 DIAGNOSIS — I442 Atrioventricular block, complete: Secondary | ICD-10-CM | POA: Diagnosis not present

## 2023-03-13 DIAGNOSIS — I480 Paroxysmal atrial fibrillation: Secondary | ICD-10-CM | POA: Diagnosis not present

## 2023-03-13 DIAGNOSIS — M6259 Muscle wasting and atrophy, not elsewhere classified, multiple sites: Secondary | ICD-10-CM | POA: Diagnosis not present

## 2023-03-13 DIAGNOSIS — Z741 Need for assistance with personal care: Secondary | ICD-10-CM | POA: Diagnosis not present

## 2023-03-13 DIAGNOSIS — I5020 Unspecified systolic (congestive) heart failure: Secondary | ICD-10-CM | POA: Diagnosis not present

## 2023-03-13 DIAGNOSIS — R1312 Dysphagia, oropharyngeal phase: Secondary | ICD-10-CM | POA: Diagnosis not present

## 2023-03-14 DIAGNOSIS — M6259 Muscle wasting and atrophy, not elsewhere classified, multiple sites: Secondary | ICD-10-CM | POA: Diagnosis not present

## 2023-03-14 DIAGNOSIS — I4891 Unspecified atrial fibrillation: Secondary | ICD-10-CM | POA: Diagnosis not present

## 2023-03-14 DIAGNOSIS — Z8673 Personal history of transient ischemic attack (TIA), and cerebral infarction without residual deficits: Secondary | ICD-10-CM | POA: Diagnosis not present

## 2023-03-14 DIAGNOSIS — E119 Type 2 diabetes mellitus without complications: Secondary | ICD-10-CM | POA: Diagnosis not present

## 2023-03-14 DIAGNOSIS — Z741 Need for assistance with personal care: Secondary | ICD-10-CM | POA: Diagnosis not present

## 2023-03-14 DIAGNOSIS — F322 Major depressive disorder, single episode, severe without psychotic features: Secondary | ICD-10-CM | POA: Diagnosis not present

## 2023-03-14 DIAGNOSIS — I10 Essential (primary) hypertension: Secondary | ICD-10-CM | POA: Diagnosis not present

## 2023-03-14 DIAGNOSIS — R41841 Cognitive communication deficit: Secondary | ICD-10-CM | POA: Diagnosis not present

## 2023-03-14 DIAGNOSIS — G4089 Other seizures: Secondary | ICD-10-CM | POA: Diagnosis not present

## 2023-03-14 DIAGNOSIS — I442 Atrioventricular block, complete: Secondary | ICD-10-CM | POA: Diagnosis not present

## 2023-03-14 DIAGNOSIS — I480 Paroxysmal atrial fibrillation: Secondary | ICD-10-CM | POA: Diagnosis not present

## 2023-03-14 DIAGNOSIS — N183 Chronic kidney disease, stage 3 unspecified: Secondary | ICD-10-CM | POA: Diagnosis not present

## 2023-03-14 DIAGNOSIS — G40909 Epilepsy, unspecified, not intractable, without status epilepticus: Secondary | ICD-10-CM | POA: Diagnosis not present

## 2023-03-14 DIAGNOSIS — R1312 Dysphagia, oropharyngeal phase: Secondary | ICD-10-CM | POA: Diagnosis not present

## 2023-03-14 DIAGNOSIS — I5030 Unspecified diastolic (congestive) heart failure: Secondary | ICD-10-CM | POA: Diagnosis not present

## 2023-03-14 DIAGNOSIS — R262 Difficulty in walking, not elsewhere classified: Secondary | ICD-10-CM | POA: Diagnosis not present

## 2023-03-14 DIAGNOSIS — I69354 Hemiplegia and hemiparesis following cerebral infarction affecting left non-dominant side: Secondary | ICD-10-CM | POA: Diagnosis not present

## 2023-03-15 DIAGNOSIS — M6259 Muscle wasting and atrophy, not elsewhere classified, multiple sites: Secondary | ICD-10-CM | POA: Diagnosis not present

## 2023-03-15 DIAGNOSIS — R41841 Cognitive communication deficit: Secondary | ICD-10-CM | POA: Diagnosis not present

## 2023-03-15 DIAGNOSIS — F322 Major depressive disorder, single episode, severe without psychotic features: Secondary | ICD-10-CM | POA: Diagnosis not present

## 2023-03-15 DIAGNOSIS — G4089 Other seizures: Secondary | ICD-10-CM | POA: Diagnosis not present

## 2023-03-15 DIAGNOSIS — I69354 Hemiplegia and hemiparesis following cerebral infarction affecting left non-dominant side: Secondary | ICD-10-CM | POA: Diagnosis not present

## 2023-03-15 DIAGNOSIS — R1312 Dysphagia, oropharyngeal phase: Secondary | ICD-10-CM | POA: Diagnosis not present

## 2023-03-15 DIAGNOSIS — I4891 Unspecified atrial fibrillation: Secondary | ICD-10-CM | POA: Diagnosis not present

## 2023-03-15 DIAGNOSIS — Z8673 Personal history of transient ischemic attack (TIA), and cerebral infarction without residual deficits: Secondary | ICD-10-CM | POA: Diagnosis not present

## 2023-03-15 DIAGNOSIS — R262 Difficulty in walking, not elsewhere classified: Secondary | ICD-10-CM | POA: Diagnosis not present

## 2023-03-15 DIAGNOSIS — N183 Chronic kidney disease, stage 3 unspecified: Secondary | ICD-10-CM | POA: Diagnosis not present

## 2023-03-15 DIAGNOSIS — I5030 Unspecified diastolic (congestive) heart failure: Secondary | ICD-10-CM | POA: Diagnosis not present

## 2023-03-15 DIAGNOSIS — G40909 Epilepsy, unspecified, not intractable, without status epilepticus: Secondary | ICD-10-CM | POA: Diagnosis not present

## 2023-03-15 DIAGNOSIS — Z741 Need for assistance with personal care: Secondary | ICD-10-CM | POA: Diagnosis not present

## 2023-03-15 DIAGNOSIS — I10 Essential (primary) hypertension: Secondary | ICD-10-CM | POA: Diagnosis not present

## 2023-03-15 DIAGNOSIS — E119 Type 2 diabetes mellitus without complications: Secondary | ICD-10-CM | POA: Diagnosis not present

## 2023-03-15 DIAGNOSIS — I442 Atrioventricular block, complete: Secondary | ICD-10-CM | POA: Diagnosis not present

## 2023-03-15 DIAGNOSIS — I480 Paroxysmal atrial fibrillation: Secondary | ICD-10-CM | POA: Diagnosis not present

## 2023-03-16 DIAGNOSIS — Z741 Need for assistance with personal care: Secondary | ICD-10-CM | POA: Diagnosis not present

## 2023-03-16 DIAGNOSIS — I480 Paroxysmal atrial fibrillation: Secondary | ICD-10-CM | POA: Diagnosis not present

## 2023-03-16 DIAGNOSIS — I69354 Hemiplegia and hemiparesis following cerebral infarction affecting left non-dominant side: Secondary | ICD-10-CM | POA: Diagnosis not present

## 2023-03-16 DIAGNOSIS — G40909 Epilepsy, unspecified, not intractable, without status epilepticus: Secondary | ICD-10-CM | POA: Diagnosis not present

## 2023-03-16 DIAGNOSIS — R1312 Dysphagia, oropharyngeal phase: Secondary | ICD-10-CM | POA: Diagnosis not present

## 2023-03-16 DIAGNOSIS — R262 Difficulty in walking, not elsewhere classified: Secondary | ICD-10-CM | POA: Diagnosis not present

## 2023-03-16 DIAGNOSIS — G4089 Other seizures: Secondary | ICD-10-CM | POA: Diagnosis not present

## 2023-03-16 DIAGNOSIS — M6259 Muscle wasting and atrophy, not elsewhere classified, multiple sites: Secondary | ICD-10-CM | POA: Diagnosis not present

## 2023-03-16 DIAGNOSIS — R41841 Cognitive communication deficit: Secondary | ICD-10-CM | POA: Diagnosis not present

## 2023-03-17 DIAGNOSIS — Z741 Need for assistance with personal care: Secondary | ICD-10-CM | POA: Diagnosis not present

## 2023-03-17 DIAGNOSIS — G4089 Other seizures: Secondary | ICD-10-CM | POA: Diagnosis not present

## 2023-03-17 DIAGNOSIS — R1312 Dysphagia, oropharyngeal phase: Secondary | ICD-10-CM | POA: Diagnosis not present

## 2023-03-17 DIAGNOSIS — I69354 Hemiplegia and hemiparesis following cerebral infarction affecting left non-dominant side: Secondary | ICD-10-CM | POA: Diagnosis not present

## 2023-03-17 DIAGNOSIS — R41841 Cognitive communication deficit: Secondary | ICD-10-CM | POA: Diagnosis not present

## 2023-03-17 DIAGNOSIS — R262 Difficulty in walking, not elsewhere classified: Secondary | ICD-10-CM | POA: Diagnosis not present

## 2023-03-17 DIAGNOSIS — G40909 Epilepsy, unspecified, not intractable, without status epilepticus: Secondary | ICD-10-CM | POA: Diagnosis not present

## 2023-03-17 DIAGNOSIS — M6259 Muscle wasting and atrophy, not elsewhere classified, multiple sites: Secondary | ICD-10-CM | POA: Diagnosis not present

## 2023-03-17 DIAGNOSIS — I480 Paroxysmal atrial fibrillation: Secondary | ICD-10-CM | POA: Diagnosis not present

## 2023-03-18 DIAGNOSIS — G4089 Other seizures: Secondary | ICD-10-CM | POA: Diagnosis not present

## 2023-03-18 DIAGNOSIS — I69354 Hemiplegia and hemiparesis following cerebral infarction affecting left non-dominant side: Secondary | ICD-10-CM | POA: Diagnosis not present

## 2023-03-18 DIAGNOSIS — G40909 Epilepsy, unspecified, not intractable, without status epilepticus: Secondary | ICD-10-CM | POA: Diagnosis not present

## 2023-03-18 DIAGNOSIS — R262 Difficulty in walking, not elsewhere classified: Secondary | ICD-10-CM | POA: Diagnosis not present

## 2023-03-18 DIAGNOSIS — Z741 Need for assistance with personal care: Secondary | ICD-10-CM | POA: Diagnosis not present

## 2023-03-18 DIAGNOSIS — R1312 Dysphagia, oropharyngeal phase: Secondary | ICD-10-CM | POA: Diagnosis not present

## 2023-03-18 DIAGNOSIS — I480 Paroxysmal atrial fibrillation: Secondary | ICD-10-CM | POA: Diagnosis not present

## 2023-03-18 DIAGNOSIS — R41841 Cognitive communication deficit: Secondary | ICD-10-CM | POA: Diagnosis not present

## 2023-03-18 DIAGNOSIS — M6259 Muscle wasting and atrophy, not elsewhere classified, multiple sites: Secondary | ICD-10-CM | POA: Diagnosis not present

## 2023-03-19 ENCOUNTER — Encounter: Payer: Self-pay | Admitting: Gastroenterology

## 2023-03-19 ENCOUNTER — Telehealth: Payer: Self-pay

## 2023-03-19 DIAGNOSIS — I442 Atrioventricular block, complete: Secondary | ICD-10-CM | POA: Diagnosis not present

## 2023-03-19 DIAGNOSIS — I4891 Unspecified atrial fibrillation: Secondary | ICD-10-CM | POA: Diagnosis not present

## 2023-03-19 DIAGNOSIS — I10 Essential (primary) hypertension: Secondary | ICD-10-CM | POA: Diagnosis not present

## 2023-03-19 DIAGNOSIS — I69354 Hemiplegia and hemiparesis following cerebral infarction affecting left non-dominant side: Secondary | ICD-10-CM | POA: Diagnosis not present

## 2023-03-19 DIAGNOSIS — R262 Difficulty in walking, not elsewhere classified: Secondary | ICD-10-CM | POA: Diagnosis not present

## 2023-03-19 DIAGNOSIS — I5030 Unspecified diastolic (congestive) heart failure: Secondary | ICD-10-CM | POA: Diagnosis not present

## 2023-03-19 DIAGNOSIS — Z8673 Personal history of transient ischemic attack (TIA), and cerebral infarction without residual deficits: Secondary | ICD-10-CM | POA: Diagnosis not present

## 2023-03-19 DIAGNOSIS — G40909 Epilepsy, unspecified, not intractable, without status epilepticus: Secondary | ICD-10-CM | POA: Diagnosis not present

## 2023-03-19 DIAGNOSIS — N183 Chronic kidney disease, stage 3 unspecified: Secondary | ICD-10-CM | POA: Diagnosis not present

## 2023-03-19 DIAGNOSIS — Z741 Need for assistance with personal care: Secondary | ICD-10-CM | POA: Diagnosis not present

## 2023-03-19 DIAGNOSIS — R41841 Cognitive communication deficit: Secondary | ICD-10-CM | POA: Diagnosis not present

## 2023-03-19 DIAGNOSIS — M6259 Muscle wasting and atrophy, not elsewhere classified, multiple sites: Secondary | ICD-10-CM | POA: Diagnosis not present

## 2023-03-19 DIAGNOSIS — R1312 Dysphagia, oropharyngeal phase: Secondary | ICD-10-CM | POA: Diagnosis not present

## 2023-03-19 DIAGNOSIS — E119 Type 2 diabetes mellitus without complications: Secondary | ICD-10-CM | POA: Diagnosis not present

## 2023-03-19 DIAGNOSIS — I480 Paroxysmal atrial fibrillation: Secondary | ICD-10-CM | POA: Diagnosis not present

## 2023-03-19 DIAGNOSIS — F322 Major depressive disorder, single episode, severe without psychotic features: Secondary | ICD-10-CM | POA: Diagnosis not present

## 2023-03-19 DIAGNOSIS — G4089 Other seizures: Secondary | ICD-10-CM | POA: Diagnosis not present

## 2023-03-19 NOTE — Telephone Encounter (Signed)
Copied from CRM [id number]. Topic: Clinical - Medical Advice >> Mar 19, 2023 11:17 AM Jorje Guild R wrote: Reason for CRM: Patient wants a GX 7 glucose meter for back of arm. Wants to call him back as soon as possible. Contact patient [phone]

## 2023-03-20 ENCOUNTER — Other Ambulatory Visit: Payer: Self-pay

## 2023-03-20 ENCOUNTER — Ambulatory Visit
Admit: 2023-03-20 | Discharge: 2023-03-20 | Disposition: A | Discharge status: Home / Self Care | Payer: Medicare HMO | Attending: Radiation Oncology | Admitting: Radiation Oncology

## 2023-03-20 DIAGNOSIS — F322 Major depressive disorder, single episode, severe without psychotic features: Secondary | ICD-10-CM | POA: Diagnosis not present

## 2023-03-20 DIAGNOSIS — M6259 Muscle wasting and atrophy, not elsewhere classified, multiple sites: Secondary | ICD-10-CM | POA: Diagnosis not present

## 2023-03-20 DIAGNOSIS — I5030 Unspecified diastolic (congestive) heart failure: Secondary | ICD-10-CM | POA: Diagnosis not present

## 2023-03-20 DIAGNOSIS — R1312 Dysphagia, oropharyngeal phase: Secondary | ICD-10-CM | POA: Diagnosis not present

## 2023-03-20 DIAGNOSIS — R41841 Cognitive communication deficit: Secondary | ICD-10-CM | POA: Diagnosis not present

## 2023-03-20 DIAGNOSIS — I69354 Hemiplegia and hemiparesis following cerebral infarction affecting left non-dominant side: Secondary | ICD-10-CM | POA: Diagnosis not present

## 2023-03-20 DIAGNOSIS — Z8673 Personal history of transient ischemic attack (TIA), and cerebral infarction without residual deficits: Secondary | ICD-10-CM | POA: Diagnosis not present

## 2023-03-20 DIAGNOSIS — E1169 Type 2 diabetes mellitus with other specified complication: Secondary | ICD-10-CM

## 2023-03-20 DIAGNOSIS — Z741 Need for assistance with personal care: Secondary | ICD-10-CM | POA: Diagnosis not present

## 2023-03-20 DIAGNOSIS — I480 Paroxysmal atrial fibrillation: Secondary | ICD-10-CM | POA: Diagnosis not present

## 2023-03-20 DIAGNOSIS — I442 Atrioventricular block, complete: Secondary | ICD-10-CM | POA: Diagnosis not present

## 2023-03-20 DIAGNOSIS — I4891 Unspecified atrial fibrillation: Secondary | ICD-10-CM | POA: Diagnosis not present

## 2023-03-20 DIAGNOSIS — E119 Type 2 diabetes mellitus without complications: Secondary | ICD-10-CM | POA: Diagnosis not present

## 2023-03-20 DIAGNOSIS — I10 Essential (primary) hypertension: Secondary | ICD-10-CM | POA: Diagnosis not present

## 2023-03-20 DIAGNOSIS — N183 Chronic kidney disease, stage 3 unspecified: Secondary | ICD-10-CM | POA: Diagnosis not present

## 2023-03-20 DIAGNOSIS — G4089 Other seizures: Secondary | ICD-10-CM | POA: Diagnosis not present

## 2023-03-20 DIAGNOSIS — G40909 Epilepsy, unspecified, not intractable, without status epilepticus: Secondary | ICD-10-CM | POA: Diagnosis not present

## 2023-03-20 DIAGNOSIS — R262 Difficulty in walking, not elsewhere classified: Secondary | ICD-10-CM | POA: Diagnosis not present

## 2023-03-20 MED ORDER — DEXCOM G7 SENSOR MISC: 0 refills | Status: DC

## 2023-03-20 MED ORDER — DEXCOM G7 RECEIVER DEVI: 0 refills | Status: DC

## 2023-03-20 NOTE — Progress Notes (Incomplete)
Pain issues, if any: *** Using a feeding tube?: *** Weight changes, if any: *** Swallowing issues, if any: *** Smoking or chewing tobacco? *** Using fluoride toothpaste daily? *** Last ENT visit was on: *** Other notable issues, if any: ***

## 2023-03-21 ENCOUNTER — Telehealth: Payer: Self-pay | Admitting: *Deleted

## 2023-03-21 DIAGNOSIS — R41841 Cognitive communication deficit: Secondary | ICD-10-CM | POA: Diagnosis not present

## 2023-03-21 DIAGNOSIS — G4089 Other seizures: Secondary | ICD-10-CM | POA: Diagnosis not present

## 2023-03-21 DIAGNOSIS — Z741 Need for assistance with personal care: Secondary | ICD-10-CM | POA: Diagnosis not present

## 2023-03-21 DIAGNOSIS — I480 Paroxysmal atrial fibrillation: Secondary | ICD-10-CM | POA: Diagnosis not present

## 2023-03-21 DIAGNOSIS — M6259 Muscle wasting and atrophy, not elsewhere classified, multiple sites: Secondary | ICD-10-CM | POA: Diagnosis not present

## 2023-03-21 DIAGNOSIS — R262 Difficulty in walking, not elsewhere classified: Secondary | ICD-10-CM | POA: Diagnosis not present

## 2023-03-21 DIAGNOSIS — G40909 Epilepsy, unspecified, not intractable, without status epilepticus: Secondary | ICD-10-CM | POA: Diagnosis not present

## 2023-03-21 DIAGNOSIS — D649 Anemia, unspecified: Secondary | ICD-10-CM | POA: Diagnosis not present

## 2023-03-21 DIAGNOSIS — R1312 Dysphagia, oropharyngeal phase: Secondary | ICD-10-CM | POA: Diagnosis not present

## 2023-03-21 DIAGNOSIS — I69354 Hemiplegia and hemiparesis following cerebral infarction affecting left non-dominant side: Secondary | ICD-10-CM | POA: Diagnosis not present

## 2023-03-21 NOTE — Telephone Encounter (Signed)
CALLED PATIENT TO ASK ABOUT RESCHEDULING MISSED FU APPT. ON 03-20-23, SPOKE WITH PATIENT'S SON AARON AND HE AGREED TO RESCHEDULE  FOR 03-27-23 @ 11:20 AM, APPT. HAS BEEN BOOKED

## 2023-03-22 ENCOUNTER — Ambulatory Visit: Payer: Medicare HMO | Attending: Physician Assistant | Admitting: Physician Assistant

## 2023-03-22 ENCOUNTER — Encounter: Payer: Self-pay | Admitting: Physician Assistant

## 2023-03-22 VITALS — BP 122/70 | HR 85 | Ht 69.0 in | Wt 169.2 lb

## 2023-03-22 DIAGNOSIS — M79642 Pain in left hand: Secondary | ICD-10-CM | POA: Diagnosis not present

## 2023-03-22 DIAGNOSIS — M25642 Stiffness of left hand, not elsewhere classified: Secondary | ICD-10-CM | POA: Diagnosis not present

## 2023-03-22 DIAGNOSIS — M6259 Muscle wasting and atrophy, not elsewhere classified, multiple sites: Secondary | ICD-10-CM | POA: Diagnosis not present

## 2023-03-22 DIAGNOSIS — R262 Difficulty in walking, not elsewhere classified: Secondary | ICD-10-CM | POA: Diagnosis not present

## 2023-03-22 DIAGNOSIS — G4089 Other seizures: Secondary | ICD-10-CM | POA: Diagnosis not present

## 2023-03-22 DIAGNOSIS — N179 Acute kidney failure, unspecified: Secondary | ICD-10-CM

## 2023-03-22 DIAGNOSIS — I420 Dilated cardiomyopathy: Secondary | ICD-10-CM

## 2023-03-22 DIAGNOSIS — I442 Atrioventricular block, complete: Secondary | ICD-10-CM | POA: Diagnosis not present

## 2023-03-22 DIAGNOSIS — I10 Essential (primary) hypertension: Secondary | ICD-10-CM

## 2023-03-22 DIAGNOSIS — I4811 Longstanding persistent atrial fibrillation: Secondary | ICD-10-CM | POA: Diagnosis not present

## 2023-03-22 DIAGNOSIS — R1312 Dysphagia, oropharyngeal phase: Secondary | ICD-10-CM | POA: Diagnosis not present

## 2023-03-22 DIAGNOSIS — G40909 Epilepsy, unspecified, not intractable, without status epilepticus: Secondary | ICD-10-CM | POA: Diagnosis not present

## 2023-03-22 DIAGNOSIS — I69354 Hemiplegia and hemiparesis following cerebral infarction affecting left non-dominant side: Secondary | ICD-10-CM | POA: Diagnosis not present

## 2023-03-22 DIAGNOSIS — R41841 Cognitive communication deficit: Secondary | ICD-10-CM | POA: Diagnosis not present

## 2023-03-22 DIAGNOSIS — Z741 Need for assistance with personal care: Secondary | ICD-10-CM | POA: Diagnosis not present

## 2023-03-22 DIAGNOSIS — I480 Paroxysmal atrial fibrillation: Secondary | ICD-10-CM | POA: Diagnosis not present

## 2023-03-22 NOTE — Progress Notes (Signed)
Cardiology Office Note:  .   Date:  03/22/2023  ID:  Brian [name], DOB 1956/01/25, MRN [id number] PCP: [name], MD  [facility] Providers Cardiologist:  Rollene Rotunda, MD     History of Present Illness: .   Brian [name] is a 68 y.o. male with past medical history of longstanding persistent A-fib, hypertension, cardiomyopathy, prior stroke and history of conduction disease.  He has known history of cardiomyopathy with EF of 25 to 30% in 2011.  At the time, he had a stroke and was treated with tPA.  He also had a history of A-fib with RVR.  EF improved to 55 to 60% on echocardiogram by September 2023.  He was admitted in November 2020 for after a fall and seizure.  Echocardiogram at the time showed EF 45 to 50% with global hypokinesis, moderate LVH, grade 2 DD, ascending aorta 4.0 cm.  He had trivial pericardial effusion without evidence of tamponade.  His heart rate was elevated at the time.  He presented to the hospital on 02/26/2023 with lethargy and altered mental status.  He was found to have atrial flutter with 4:1 conduction and underlying heart block.  This is new compared to the previous longstanding persistent atrial fibrillation with bifascicular block.  He also had acute kidney injury with creatinine increased from baseline 1.5 to 2.8.  Home diltiazem and metoprolol were held.  He was placed with temporary pacing wire by Dr. Herbie Baltimore on 02/26/2023.  Repeat echocardiogram obtained on 02/27/2023 showed EF 60 to 65%, moderate LVH, mildly reduced RV, moderate LAE, no significant valve issue.  Cardizem was restarted.  Complete heart block eventually resolved and a temporary pacing wire removed.  He was sent out of with a 3-day heart monitor to follow-up on his heart rate.  Low-dose beta-blocker was added for rate control.  Jardiance was stopped during this hospitalization.  Patient presents today for follow-up.  He denies any further weakness.  He has been doing well since that  hospitalization.  He is currently in [facility].  Heart rate in the 80s.  For some reason he is on both metoprolol succinate 50 mg daily and metoprolol tartrate 25 mg twice a day.  I suspect he was supposed to only take 1 dose of metoprolol tartrate 25 mg tablet on the night of 1/16 before transition to metoprolol succinate 50 mg daily on 1/17.  Right now he is on both medication.  I will discontinue metoprolol tartrate.  I will bring him back in 2 to 4 weeks to monitor his heart rate.  He is also not sure what happened to the heart monitor.  He says he suspect his son has returned the heart monitor however is unable to tell me when was the heart monitor returned.  I cannot see the result of the heart monitor at this time.  We will see the patient back in 2 to 4 weeks with me and 3 to 4 months with Dr. Antoine Poche.  I will will reassess his heart rate on the next follow-up.  ROS:   He denies chest pain, palpitations, dyspnea, pnd, orthopnea, n, v, dizziness, syncope, edema, weight gain, or early satiety. All other systems reviewed and are otherwise negative except as noted above.    Studies Reviewed: .        Cardiac Studies & Procedures   CARDIAC CATHETERIZATION  CARDIAC CATHETERIZATION 02/26/2023  Narrative   Anticipated discharge date to be determined.   No indication for antiplatelet  therapy at this time .  Successful Temporary Pacemaker Placement via RFV 6 Fr Sheath -> 62 cm, Rate 80 bpm, 5 mA (threshold 2 mA)  The patient will be transported to the CVICU.  Will use right knee immobilizer as well as bilateral wrist and right ankle restraints to avoid moving the pacemaker. Chest x-ray ordered to evaluate line positioning.   [name], MD    ECHOCARDIOGRAM  ECHOCARDIOGRAM COMPLETE 02/27/2023  Narrative ECHOCARDIOGRAM REPORT    Patient Name:   Brian [name] Date of Exam: 02/27/2023 Medical Rec #:  [id number]         Height:       69.0 in Accession #:    [id number]         Weight:       167.5 lb Date of Birth:  Jun 28, 1955          BSA:          1.916 m Patient Age:    67 years          BP:           134/82 mmHg Patient Gender: M                 HR:           90 bpm. Exam Location:  Inpatient  Procedure: 2D Echo, Cardiac Doppler, Color Doppler and Intracardiac Opacification Agent  Indications:    Heart Block, Complete  History:        Patient has prior history of Echocardiogram examinations, most recent 01/07/2023. Arrythmias:Atrial Fibrillation; Risk Factors:Hypertension, Diabetes and Dyslipidemia.  Sonographer:    Karma Ganja Referring Phys: 6 Taheem W HARDING   Sonographer Comments: Technically challenging study due to limited acoustic windows. IMPRESSIONS   1. Cannot fully assess wall motion. Left ventricular ejection fraction, by estimation, is 60 to 65%. The left ventricle has normal function. There is moderate concentric left ventricular hypertrophy. Left ventricular diastolic parameters are indeterminate. 2. Right ventricular systolic function is mildly reduced. The right ventricular size is mildly enlarged. Tricuspid regurgitation signal is inadequate for assessing PA pressure. 3. Left atrial size was moderately dilated. 4. Right atrial size was mildly dilated. 5. The mitral valve was not well visualized. No evidence of mitral valve regurgitation. 6. The aortic valve was not well visualized. Aortic valve regurgitation is not visualized. 7. The inferior vena cava is dilated in size with >50% respiratory variability, suggesting right atrial pressure of 8 mmHg.  FINDINGS Left Ventricle: Cannot fully assess wall motion. Left ventricular ejection fraction, by estimation, is 60 to 65%. The left ventricle has normal function. Definity contrast agent was given IV to delineate the left ventricular endocardial borders. The left ventricular internal cavity size was normal in size. There is moderate concentric left ventricular hypertrophy. Left  ventricular diastolic parameters are indeterminate.  Right Ventricle: The right ventricular size is mildly enlarged. Right ventricular systolic function is mildly reduced. Tricuspid regurgitation signal is inadequate for assessing PA pressure.  Left Atrium: Left atrial size was moderately dilated.  Right Atrium: Right atrial size was mildly dilated.  Pericardium: There is no evidence of pericardial effusion.  Mitral Valve: The mitral valve was not well visualized. No evidence of mitral valve regurgitation.  Tricuspid Valve: Tricuspid valve regurgitation is not demonstrated.  Aortic Valve: The aortic valve was not well visualized. Aortic valve regurgitation is not visualized. Aortic valve mean gradient measures 1.7 mmHg. Aortic valve peak gradient measures 3.1 mmHg. Aortic valve area, by VTI measures  2.82 cm.  Pulmonic Valve: Pulmonic valve regurgitation is not visualized.  Aorta: The aortic root and ascending aorta are structurally normal, with no evidence of dilitation.  Venous: The inferior vena cava is dilated in size with greater than 50% respiratory variability, suggesting right atrial pressure of 8 mmHg.  IAS/Shunts: The interatrial septum was not well visualized.   LEFT VENTRICLE PLAX 2D LVIDd:         4.00 cm   Diastology LVIDs:         2.40 cm   LV e' medial:    4.93 cm/s LV PW:         1.30 cm   LV E/e' medial:  17.6 LV IVS:        1.30 cm   LV e' lateral:   7.29 cm/s LVOT diam:     2.00 cm   LV E/e' lateral: 11.9 LV SV:         43 LV SV Index:   22 LVOT Area:     3.14 cm   RIGHT VENTRICLE            IVC RV Basal diam:  4.30 cm    IVC diam: 2.60 cm RV S prime:     6.56 cm/s TAPSE (M-mode): 1.6 cm  LEFT ATRIUM              Index        RIGHT ATRIUM           Index LA diam:        4.30 cm  2.24 cm/m   RA Area:     24.10 cm LA Vol (A2C):   68.1 ml  35.54 ml/m  RA Volume:   76.00 ml  39.66 ml/m LA Vol (A4C):   105.0 ml 54.79 ml/m LA Biplane Vol: 86.1 ml   44.93 ml/m AORTIC VALVE AV Area (Vmax):    2.38 cm AV Area (Vmean):   2.40 cm AV Area (VTI):     2.82 cm AV Vmax:           88.00 cm/s AV Vmean:          60.667 cm/s AV VTI:            0.153 m AV Peak Grad:      3.1 mmHg AV Mean Grad:      1.7 mmHg LVOT Vmax:         66.63 cm/s LVOT Vmean:        46.433 cm/s LVOT VTI:          0.137 m LVOT/AV VTI ratio: 0.90  AORTA Ao Root diam: 3.40 cm Ao Asc diam:  3.30 cm  MITRAL VALVE MV Area (PHT): 3.92 cm    SHUNTS MV Decel Time: 194 msec    Systemic VTI:  0.14 m MV E velocity: 86.80 cm/s  Systemic Diam: 2.00 cm  Carolan Clines Electronically signed by Carolan Clines Signature Date/Time: 02/27/2023/12:35:35 PM    Final   MONITORS  CARDIAC EVENT MONITOR 03/16/2021           Risk Assessment/Calculations:    CHA2DS2-VASc Score = 6   This indicates a 9.7% annual risk of stroke. The patient's score is based upon: CHF History: 1 HTN History: 1 Diabetes History: 0 Stroke History: 2 Vascular Disease History: 1 Age Score: 1 Gender Score: 0            Physical Exam:   VS:  BP 122/70 (BP Location: Left Arm, Patient  Position: Sitting, Cuff Size: Normal)   Pulse 85   Ht 5\' 9"  (1.753 m)   Wt 169 lb 3.2 oz (76.7 kg)   SpO2 96%   BMI 24.99 kg/m    Wt Readings from Last 3 Encounters:  03/22/23 169 lb 3.2 oz (76.7 kg)  03/07/23 155 lb 10.3 oz (70.6 kg)  01/07/23 162 lb 11.2 oz (73.8 kg)    GEN: Well nourished, well developed in no acute distress NECK: No JVD; No carotid bruits CARDIAC: Irregularly irregular, no murmurs, rubs, gallops RESPIRATORY:  Clear to auscultation without rales, wheezing or rhonchi  ABDOMEN: Soft, non-tender, non-distended EXTREMITIES:  No edema; No deformity   ASSESSMENT AND PLAN: .     Complete heart block: Resolved after temporary pacing wire and weaning off of some rate control medication.  He was discharged on Cardizem 180 mg daily and metoprolol succinate 50 mg daily.  Based on discharge  paperwork, he was supposed to take only 1 dose of metoprolol tartrate 25 mg on the night of the discharge before transitioning to metoprolol succinate.  Based on records sent over by nursing facility, it appears he has been given both metoprolol succinate and metoprolol tartrate since discharge.  I will discontinue metoprolol tartrate, and bring the patient back in 2 to 4 weeks to observe his heart rate response.  He was also discharged on a 3-day heart monitor, he is under the impression that his son may have returned it however I cannot see the result of the heart monitor.  Longstanding persistent Atrial Fibrillation  He has been in atrial fibrillation for years.  New onset of atrial flutter with 4:1 conduction and underlying heart block noted during the recent hospitalization. Heart rate currently well controlled on current therapy, however he is on both metoprolol tartrate and metoprolol succinate. -Discontinue Metoprolol Tartrate and continue Metoprolol Succinate 50mg  daily. -Follow up in 2-4 weeks to monitor heart rate.  Dilated cardiomyopathy Improved EF from 25-30 % in 2011 to 60-65% in 2025. No significant valvular issues. -Continue current cardiac medications.  Acute Kidney Injury Creatinine increased from baseline of 1.5 to 2.8 during recent hospitalization. Currently returning to baseline. -Continue to monitor kidney function.  Hypertension: Blood pressure stable       Dispo: Follow-up with Dr. Antoine Poche in 3 to 4 months, I will see the patient back in 2 to 3 weeks to reassess heart rate.  Signed, Azalee Course, PA

## 2023-03-22 NOTE — Patient Instructions (Signed)
Medication Instructions:  DISCONTINUE METOPROLOL TARTRATE  *If you need a refill on your cardiac medications before your next appointment, please call your pharmacy*   Lab Work: NO LABS  If you have labs (blood work) drawn today and your tests are completely normal, you will receive your results only by: MyChart Message (if you have MyChart) OR A paper copy in the mail If you have any lab test that is abnormal or we need to change your treatment, we will call you to review the results.   Testing/Procedures: NO TESTING   Follow-Up: At [facility], you and your health needs are our priority.  As part of our continuing mission to provide you with exceptional heart care, we have created designated Provider Care Teams.  These Care Teams include your primary Cardiologist (physician) and Advanced Practice Providers (APPs -  Physician Assistants and Nurse Practitioners) who all work together to provide you with the care you need, when you need it.  Your next appointment:   2-4 week(s)  Provider:   Azalee Course, PA-C    Then, Rollene Rotunda, MD will plan to see you again in 3-4 month(s).   Other Instructions

## 2023-03-23 DIAGNOSIS — R1312 Dysphagia, oropharyngeal phase: Secondary | ICD-10-CM | POA: Diagnosis not present

## 2023-03-23 DIAGNOSIS — I69354 Hemiplegia and hemiparesis following cerebral infarction affecting left non-dominant side: Secondary | ICD-10-CM | POA: Diagnosis not present

## 2023-03-23 DIAGNOSIS — M6259 Muscle wasting and atrophy, not elsewhere classified, multiple sites: Secondary | ICD-10-CM | POA: Diagnosis not present

## 2023-03-23 DIAGNOSIS — R41841 Cognitive communication deficit: Secondary | ICD-10-CM | POA: Diagnosis not present

## 2023-03-23 DIAGNOSIS — Z741 Need for assistance with personal care: Secondary | ICD-10-CM | POA: Diagnosis not present

## 2023-03-23 DIAGNOSIS — G4089 Other seizures: Secondary | ICD-10-CM | POA: Diagnosis not present

## 2023-03-23 DIAGNOSIS — I480 Paroxysmal atrial fibrillation: Secondary | ICD-10-CM | POA: Diagnosis not present

## 2023-03-23 DIAGNOSIS — Z8673 Personal history of transient ischemic attack (TIA), and cerebral infarction without residual deficits: Secondary | ICD-10-CM | POA: Diagnosis not present

## 2023-03-23 DIAGNOSIS — R262 Difficulty in walking, not elsewhere classified: Secondary | ICD-10-CM | POA: Diagnosis not present

## 2023-03-24 DIAGNOSIS — I480 Paroxysmal atrial fibrillation: Secondary | ICD-10-CM | POA: Diagnosis not present

## 2023-03-24 DIAGNOSIS — G4089 Other seizures: Secondary | ICD-10-CM | POA: Diagnosis not present

## 2023-03-24 DIAGNOSIS — R262 Difficulty in walking, not elsewhere classified: Secondary | ICD-10-CM | POA: Diagnosis not present

## 2023-03-24 DIAGNOSIS — R41841 Cognitive communication deficit: Secondary | ICD-10-CM | POA: Diagnosis not present

## 2023-03-24 DIAGNOSIS — R1312 Dysphagia, oropharyngeal phase: Secondary | ICD-10-CM | POA: Diagnosis not present

## 2023-03-24 DIAGNOSIS — Z741 Need for assistance with personal care: Secondary | ICD-10-CM | POA: Diagnosis not present

## 2023-03-24 DIAGNOSIS — Z8673 Personal history of transient ischemic attack (TIA), and cerebral infarction without residual deficits: Secondary | ICD-10-CM | POA: Diagnosis not present

## 2023-03-24 DIAGNOSIS — M6259 Muscle wasting and atrophy, not elsewhere classified, multiple sites: Secondary | ICD-10-CM | POA: Diagnosis not present

## 2023-03-24 DIAGNOSIS — I69354 Hemiplegia and hemiparesis following cerebral infarction affecting left non-dominant side: Secondary | ICD-10-CM | POA: Diagnosis not present

## 2023-03-25 ENCOUNTER — Ambulatory Visit (INDEPENDENT_AMBULATORY_CARE_PROVIDER_SITE_OTHER): Payer: Medicare HMO | Admitting: Podiatry

## 2023-03-25 ENCOUNTER — Encounter: Payer: Self-pay | Admitting: Podiatry

## 2023-03-25 ENCOUNTER — Ambulatory Visit: Payer: Self-pay | Admitting: Family Medicine

## 2023-03-25 ENCOUNTER — Other Ambulatory Visit: Payer: Self-pay

## 2023-03-25 VITALS — Ht 69.0 in | Wt 169.0 lb

## 2023-03-25 DIAGNOSIS — R262 Difficulty in walking, not elsewhere classified: Secondary | ICD-10-CM | POA: Diagnosis not present

## 2023-03-25 DIAGNOSIS — M79675 Pain in left toe(s): Secondary | ICD-10-CM

## 2023-03-25 DIAGNOSIS — Z741 Need for assistance with personal care: Secondary | ICD-10-CM | POA: Diagnosis not present

## 2023-03-25 DIAGNOSIS — I69354 Hemiplegia and hemiparesis following cerebral infarction affecting left non-dominant side: Secondary | ICD-10-CM | POA: Diagnosis not present

## 2023-03-25 DIAGNOSIS — Z8673 Personal history of transient ischemic attack (TIA), and cerebral infarction without residual deficits: Secondary | ICD-10-CM | POA: Diagnosis not present

## 2023-03-25 DIAGNOSIS — B351 Tinea unguium: Secondary | ICD-10-CM

## 2023-03-25 DIAGNOSIS — R1312 Dysphagia, oropharyngeal phase: Secondary | ICD-10-CM | POA: Diagnosis not present

## 2023-03-25 DIAGNOSIS — I480 Paroxysmal atrial fibrillation: Secondary | ICD-10-CM | POA: Diagnosis not present

## 2023-03-25 DIAGNOSIS — G4089 Other seizures: Secondary | ICD-10-CM | POA: Diagnosis not present

## 2023-03-25 DIAGNOSIS — M6259 Muscle wasting and atrophy, not elsewhere classified, multiple sites: Secondary | ICD-10-CM | POA: Diagnosis not present

## 2023-03-25 DIAGNOSIS — R41841 Cognitive communication deficit: Secondary | ICD-10-CM | POA: Diagnosis not present

## 2023-03-25 DIAGNOSIS — E1169 Type 2 diabetes mellitus with other specified complication: Secondary | ICD-10-CM | POA: Diagnosis not present

## 2023-03-25 DIAGNOSIS — M79674 Pain in right toe(s): Secondary | ICD-10-CM | POA: Insufficient documentation

## 2023-03-25 DIAGNOSIS — E669 Obesity, unspecified: Secondary | ICD-10-CM

## 2023-03-25 MED ORDER — DEXCOM G7 RECEIVER DEVI: 0 refills | Status: DC

## 2023-03-25 MED ORDER — DEXCOM G7 SENSOR MISC: 0 refills | Status: DC

## 2023-03-25 NOTE — Progress Notes (Signed)
This patient presents to the office with chief complaint of long thick nails and diabetic feet.  This patient  says there  is  no pain and discomfort in their feet.  This patient says there are long thick painful nails.  These nails are painful walking and wearing shoes.  Patient has no history of infection or drainage from both feet.  Patient is unable to  self treat his own nails . This patient presents  to the office today for treatment of the  long nails and a foot evaluation due to history of  diabetes. Patient has CKD and CVA and coagulation defect due to eliquis.  General Appearance  Alert, conversant and in no acute stress.  Vascular  Dorsalis pedis and posterior tibial  pulses are  weakly palpable  bilaterally.  Capillary return is within normal limits  bilaterally. Temperature is within normal limits  bilaterally.  Neurologic  Senn-Weinstein monofilament wire test diminished   bilaterally. Muscle power within normal limits bilaterally.  Nails Thick disfigured discolored nails with subungual debris  from hallux to fifth toes bilaterally. No evidence of bacterial infection or drainage bilaterally.  Orthopedic  No limitations of motion of motion feet .  No crepitus or effusions noted.  No bony pathology or digital deformities noted.  Skin  normotropic skin with no porokeratosis noted bilaterally.  No signs of infections or ulcers noted.     Onychomycosis  Diabetes with no foot complications  IE  Debride nails x 10.  A diabetic foot exam was performed and there is evidence of vascular or neurologic pathology.   RTC 3 months.   Helane Gunther DPM

## 2023-03-25 NOTE — Telephone Encounter (Addendum)
Second attempt for triage, unable to leave message.  This RN attempted to contact the patient for triage on both listed numbers, no answer, unable to leave voicemail.  Copied from CRM [id number]. Topic: Clinical - Red Word Triage >> Mar 25, 2023 11:18 AM Geroge Baseman wrote: Red Word that prompted transfer to Nurse Triage: Patient states he has been waiting for a long for his glucose sensors, says pricking fingers is too much pain and he refuses to do it anymore

## 2023-03-25 NOTE — Telephone Encounter (Addendum)
  Chief Complaint: sensors for BG checks  Disposition: [] ED /[] Urgent Care (no appt availability in office) / [] Appointment(In office/virtual)/ []  [facility]/ [x] Home Care/ [] Refused Recommended Disposition /[] [facility]/ []  Follow-up with PCP Additional Notes: Duplicate contact, patient received information from clinic staff through my chart.   Reason for Disposition  Caller has already spoken with the PCP and has no further questions.  Answer Assessment - Initial Assessment Questions N/A Patient contacted by clinic through mychart by office staff to advised sensors had been sent to pharmacy.  Protocols used: No Contact or Duplicate Contact Call-A-AH

## 2023-03-26 DIAGNOSIS — M6259 Muscle wasting and atrophy, not elsewhere classified, multiple sites: Secondary | ICD-10-CM | POA: Diagnosis not present

## 2023-03-26 DIAGNOSIS — G4089 Other seizures: Secondary | ICD-10-CM | POA: Diagnosis not present

## 2023-03-26 DIAGNOSIS — Z741 Need for assistance with personal care: Secondary | ICD-10-CM | POA: Diagnosis not present

## 2023-03-26 DIAGNOSIS — Z8673 Personal history of transient ischemic attack (TIA), and cerebral infarction without residual deficits: Secondary | ICD-10-CM | POA: Diagnosis not present

## 2023-03-26 DIAGNOSIS — R262 Difficulty in walking, not elsewhere classified: Secondary | ICD-10-CM | POA: Diagnosis not present

## 2023-03-26 DIAGNOSIS — E119 Type 2 diabetes mellitus without complications: Secondary | ICD-10-CM | POA: Diagnosis not present

## 2023-03-26 DIAGNOSIS — I69354 Hemiplegia and hemiparesis following cerebral infarction affecting left non-dominant side: Secondary | ICD-10-CM | POA: Diagnosis not present

## 2023-03-26 DIAGNOSIS — R1312 Dysphagia, oropharyngeal phase: Secondary | ICD-10-CM | POA: Diagnosis not present

## 2023-03-26 DIAGNOSIS — R41841 Cognitive communication deficit: Secondary | ICD-10-CM | POA: Diagnosis not present

## 2023-03-26 DIAGNOSIS — I480 Paroxysmal atrial fibrillation: Secondary | ICD-10-CM | POA: Diagnosis not present

## 2023-03-27 ENCOUNTER — Ambulatory Visit: Admission: RE | Admit: 2023-03-27 | Payer: Medicare HMO | Source: Ambulatory Visit | Admitting: Radiation Oncology

## 2023-03-27 ENCOUNTER — Telehealth: Payer: Self-pay | Admitting: Radiation Oncology

## 2023-03-27 DIAGNOSIS — Z8673 Personal history of transient ischemic attack (TIA), and cerebral infarction without residual deficits: Secondary | ICD-10-CM | POA: Diagnosis not present

## 2023-03-27 DIAGNOSIS — R1312 Dysphagia, oropharyngeal phase: Secondary | ICD-10-CM | POA: Diagnosis not present

## 2023-03-27 DIAGNOSIS — Z741 Need for assistance with personal care: Secondary | ICD-10-CM | POA: Diagnosis not present

## 2023-03-27 DIAGNOSIS — M6259 Muscle wasting and atrophy, not elsewhere classified, multiple sites: Secondary | ICD-10-CM | POA: Diagnosis not present

## 2023-03-27 DIAGNOSIS — I69354 Hemiplegia and hemiparesis following cerebral infarction affecting left non-dominant side: Secondary | ICD-10-CM | POA: Diagnosis not present

## 2023-03-27 DIAGNOSIS — M25642 Stiffness of left hand, not elsewhere classified: Secondary | ICD-10-CM | POA: Diagnosis not present

## 2023-03-27 DIAGNOSIS — G4089 Other seizures: Secondary | ICD-10-CM | POA: Diagnosis not present

## 2023-03-27 DIAGNOSIS — R262 Difficulty in walking, not elsewhere classified: Secondary | ICD-10-CM | POA: Diagnosis not present

## 2023-03-27 DIAGNOSIS — I480 Paroxysmal atrial fibrillation: Secondary | ICD-10-CM | POA: Diagnosis not present

## 2023-03-27 DIAGNOSIS — R41841 Cognitive communication deficit: Secondary | ICD-10-CM | POA: Diagnosis not present

## 2023-03-27 NOTE — Telephone Encounter (Signed)
 Called daughter as requested to r/s pt's missed appt. Daughter advised to call [facility] for scheduling since pt is in facility. Called [facility], left message with Bridgette Campus to get c/b from coordinator Diane to r/s appt.

## 2023-03-28 ENCOUNTER — Telehealth: Payer: Self-pay | Admitting: Radiation Oncology

## 2023-03-28 NOTE — Telephone Encounter (Signed)
 Called [facility] to r/s pt's missed appt. Unable to reach coordinator; left message with secretary stating pt's new appt date/time to give to coordinator Diane.

## 2023-03-29 ENCOUNTER — Telehealth: Payer: Self-pay | Admitting: Radiation Oncology

## 2023-03-29 DIAGNOSIS — E119 Type 2 diabetes mellitus without complications: Secondary | ICD-10-CM | POA: Diagnosis not present

## 2023-03-29 DIAGNOSIS — I4891 Unspecified atrial fibrillation: Secondary | ICD-10-CM | POA: Diagnosis not present

## 2023-03-29 DIAGNOSIS — I442 Atrioventricular block, complete: Secondary | ICD-10-CM | POA: Diagnosis not present

## 2023-03-29 DIAGNOSIS — Z8673 Personal history of transient ischemic attack (TIA), and cerebral infarction without residual deficits: Secondary | ICD-10-CM | POA: Diagnosis not present

## 2023-03-29 DIAGNOSIS — N183 Chronic kidney disease, stage 3 unspecified: Secondary | ICD-10-CM | POA: Diagnosis not present

## 2023-03-29 DIAGNOSIS — F322 Major depressive disorder, single episode, severe without psychotic features: Secondary | ICD-10-CM | POA: Diagnosis not present

## 2023-03-29 DIAGNOSIS — I5030 Unspecified diastolic (congestive) heart failure: Secondary | ICD-10-CM | POA: Diagnosis not present

## 2023-03-29 DIAGNOSIS — I10 Essential (primary) hypertension: Secondary | ICD-10-CM | POA: Diagnosis not present

## 2023-03-29 DIAGNOSIS — I5020 Unspecified systolic (congestive) heart failure: Secondary | ICD-10-CM | POA: Diagnosis not present

## 2023-03-29 NOTE — Telephone Encounter (Signed)
 Received call from Diane @ [facility] advising pt has another appt at this date/time. Pt's appt moved to 2/11@3 :40pm; location and provider name provided to coordinator.

## 2023-04-02 ENCOUNTER — Ambulatory Visit
Admission: RE | Admit: 2023-04-02 | Discharge: 2023-04-02 | Discharge status: Home / Self Care | Payer: Medicare HMO | Source: Ambulatory Visit | Attending: Radiation Oncology | Admitting: Radiation Oncology

## 2023-04-02 VITALS — BP 160/99 | HR 91 | Temp 97.8°F | Resp 20 | Ht 69.0 in | Wt 175.6 lb

## 2023-04-02 DIAGNOSIS — Z79899 Other long term (current) drug therapy: Secondary | ICD-10-CM | POA: Diagnosis not present

## 2023-04-02 DIAGNOSIS — C3432 Malignant neoplasm of lower lobe, left bronchus or lung: Secondary | ICD-10-CM | POA: Diagnosis not present

## 2023-04-02 DIAGNOSIS — M25473 Effusion, unspecified ankle: Secondary | ICD-10-CM | POA: Diagnosis not present

## 2023-04-02 DIAGNOSIS — R531 Weakness: Secondary | ICD-10-CM | POA: Insufficient documentation

## 2023-04-02 DIAGNOSIS — R262 Difficulty in walking, not elsewhere classified: Secondary | ICD-10-CM | POA: Diagnosis not present

## 2023-04-02 DIAGNOSIS — C01 Malignant neoplasm of base of tongue: Secondary | ICD-10-CM | POA: Insufficient documentation

## 2023-04-02 DIAGNOSIS — G4089 Other seizures: Secondary | ICD-10-CM | POA: Diagnosis not present

## 2023-04-02 DIAGNOSIS — Z8673 Personal history of transient ischemic attack (TIA), and cerebral infarction without residual deficits: Secondary | ICD-10-CM | POA: Diagnosis not present

## 2023-04-02 DIAGNOSIS — M6259 Muscle wasting and atrophy, not elsewhere classified, multiple sites: Secondary | ICD-10-CM | POA: Diagnosis not present

## 2023-04-02 DIAGNOSIS — C771 Secondary and unspecified malignant neoplasm of intrathoracic lymph nodes: Secondary | ICD-10-CM | POA: Diagnosis not present

## 2023-04-02 DIAGNOSIS — G40909 Epilepsy, unspecified, not intractable, without status epilepticus: Secondary | ICD-10-CM | POA: Diagnosis not present

## 2023-04-02 DIAGNOSIS — Z741 Need for assistance with personal care: Secondary | ICD-10-CM | POA: Diagnosis not present

## 2023-04-02 DIAGNOSIS — Z7901 Long term (current) use of anticoagulants: Secondary | ICD-10-CM | POA: Insufficient documentation

## 2023-04-02 DIAGNOSIS — Z85118 Personal history of other malignant neoplasm of bronchus and lung: Secondary | ICD-10-CM | POA: Diagnosis not present

## 2023-04-02 DIAGNOSIS — R41841 Cognitive communication deficit: Secondary | ICD-10-CM | POA: Diagnosis not present

## 2023-04-02 DIAGNOSIS — I69354 Hemiplegia and hemiparesis following cerebral infarction affecting left non-dominant side: Secondary | ICD-10-CM | POA: Diagnosis not present

## 2023-04-02 DIAGNOSIS — R1312 Dysphagia, oropharyngeal phase: Secondary | ICD-10-CM | POA: Diagnosis not present

## 2023-04-02 DIAGNOSIS — I480 Paroxysmal atrial fibrillation: Secondary | ICD-10-CM | POA: Diagnosis not present

## 2023-04-02 NOTE — Progress Notes (Signed)
Mr. Jair Lindblad presents today for a follow up appointment    Pain issues, if any: Patient denies any throat pain. Using a feeding tube?: None.  Weight changes, if any: Patient has been gaining weight. Patient has been eating three meals a day. Swallowing issues, if any: Patient was having trouble swallowing food during radiation treatments but has since gotten better. He is able to eat foods without difficulty. Smoking or chewing tobacco? Patient currently doesn't smoke. Using fluoride toothpaste daily? Yes Last ENT visit was on: Patient last seen ENT last year. Other notable issues, if any: None    Patient feels like he is progressing well after treatment. Temp 97.8 oral B/P 160/99 Pulse 91 Oxygen saturations 96% on room air Respirations 20    Wt Readings from Last 3 Encounters:  04/02/23 175 lb 9.6 oz (79.7 kg)  03/25/23 169 lb (76.7 kg)  03/22/23 169 lb 3.2 oz (76.7 kg)

## 2023-04-02 NOTE — Progress Notes (Signed)
Radiation Oncology         [phone] ________________________________  Name: Brian [name] MRN: [id number]  Date: 04/02/2023  DOB: 1955/12/29  Follow-Up Visit Note  CC: [name], MD  [name], MD  Diagnosis and Prior Radiotherapy:       ICD-10-CM   1. 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; p16 positive (HPV associated)   ==========DELIVERED PLANS==========  First Treatment Date: 2022-12-19 - Last Treatment Date: 2023-01-02   Plan Name: Chest Site: Thorax, intrathoracic lymph nodes Technique: 3D Mode: Photon Dose Per Fraction: 3 Gy Prescribed Dose (Delivered / Prescribed): 30 Gy / 30 Gy Prescribed Fxs (Delivered / Prescribed): 10 / 10  06/21/2021 - 07/06/2021: The left lower lung mass 60 Gy in 5 fractions  03/14/2020 - 05/04/2020: The neck received 70 Gy in 35 fractions   CHIEF COMPLAINT:  Here for follow-up and surveillance of 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 He completed radiation to his intrathoracic lymph nodes on 01/02/2023.  Marland Kitchen He reports feeling significantly better since his hospitalization in January, with no issues regarding his breathing or swallowing. He notes occasional regurgitation of food, but overall, he has no difficulty eating, even harder foods, and has gained weight. He attributes his improved swallowing to the help of SLP Baldo Ash.  The patient also mentions having seizures, but the cause is unknown. He is scheduled to see a neurologist  later in the month. 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 He plans to use weights at home to improve muscle strength in his arm. He is in assisted living.  His son is with him today visiting from [place].   The patient also reports plans to have dental work done, including the removal of all his teeth. He has been experiencing some swelling in his ankles but has not taken his prescribed diuretic in a long time. He also mentions the recent loss of his mother, which has been emotionally challenging.                     ALLERGIES:  is allergic to codeine and metformin and related.  Meds: Current Outpatient Medications  Medication Sig Dispense Refill   apixaban (ELIQUIS) 5 MG TABS tablet Take 1 tablet (5 mg total) by mouth 2 (two) times daily. 60 tablet 5   atorvastatin (LIPITOR) 20 MG tablet Take 1 tablet (20 mg total) by mouth daily. (Patient taking differently: Take 20 mg by mouth at bedtime.) 90 tablet 3   Continuous Glucose Receiver (DEXCOM G7 RECEIVER) DEVI Use to check blood sugar continuously 1 each 0   Continuous Glucose Sensor (DEXCOM G7 SENSOR) MISC Change sensor every 10 days 1 each 0   diltiazem (CARDIZEM CD) 180 MG 24 hr capsule Take 1 capsule (180 mg total) by mouth daily. 60 capsule 0   glucose blood test strip 1 each by Other route as needed for  other. Use as instructed 100 each 11   insulin aspart (NOVOLOG FLEXPEN) 100 UNIT/ML FlexPen 0-9 Units, Subcutaneous, 3 times daily with meals CBG < 70: Implement Hypoglycemia measures CBG 70 - 120: 0 units CBG 121 - 150: 1 unit CBG 151 - 200: 2 units CBG 201 - 250: 3 units CBG 251 - 300: 5 units CBG 301 - 350: 7 units CBG 351 - 400: 9 units CBG > 400: call MD     levETIRAcetam (KEPPRA) 500 MG tablet Take 1 tablet (500 mg total) by mouth 2 (two) times daily.     Magnesium 200 MG TABS Take 400 mg by mouth daily.     metoprolol succinate (TOPROL XL) 50 MG 24 hr  tablet Take 1 tablet (50 mg total) by mouth daily. Start on 1/17 Take with or immediately following a meal.     pantoprazole (PROTONIX) 40 MG tablet Take 1 tablet (40 mg total) by mouth 2 (two) times daily before a meal. 120 tablet 0   polyethylene glycol (MIRALAX / GLYCOLAX) 17 g packet Take 17 g by mouth daily as needed for mild constipation or moderate constipation.     No current facility-administered medications for this encounter.    Physical Findings: The patient is in no acute distress. Patient is alert and oriented. Wt Readings from Last 3 Encounters:  04/02/23 175 lb 9.6 oz (79.7 kg)  03/25/23 169 lb (76.7 kg)  03/22/23 169 lb 3.2 oz (76.7 kg)    height is 5\' 9"  (1.753 m) and weight is 175 lb 9.6 oz (79.7 kg). His temperature is 97.8 F (36.6 C). His blood pressure is 160/99 (abnormal) and his pulse is 91. His respiration is 20 and oxygen saturation is 96%. .  General: Alert and oriented, in no acute distress HEENT: Upper throat clear, no signs of thrush or mucositis in oral cavity, missing posterior molars and many other teeth. NECK: Chronic lymphedema anteriorly, fibrosis bilaterally, no palpable adenopathy. CHEST: Good airflow bilaterally CARDIOVASCULAR: Heart rhythm is irregular. ABDOMEN: Soft, nontender, nondistended. EXTREMITIES: pitting edema in ankles. MUSCULOSKELETAL: Weakness in left arm, wearing splint on left forearm for finger contractures. SKIN: Over neck healed, slightly hyperpigmented. Lymphatics: see Neck Exam Psychiatric: Judgment and insight are intact. Affect is appropriate.   Lab Findings: Lab Results  Component Value Date   WBC 4.1 03/07/2023   HGB 9.5 (L) 03/07/2023   HCT 29.2 (L) 03/07/2023   MCV 90.7 03/07/2023   PLT 148 (L) 03/07/2023    Lab Results  Component Value Date   TSH 1.760 02/26/2023    Radiographic Findings: No results found.  Impression/Plan:    Cancer Staging  Malignant neoplasm of base of tongue (HCC) Staging form:  Pharynx - HPV-Mediated Oropharynx, AJCC 8th Edition - Clinical stage from 03/01/2020: Stage III (cT2, cN3, cM0, p16+) - Signed by Lonie Peak, MD on 03/02/2020 Stage prefix: Initial diagnosis  Now with STAGE IV thoracic metastases  Post-radiation treatment in November 2024 for metastatic disease to thoracic nodes, patient reports good swallowing and breathing. No current symptoms suggestive of cancer progression. -Schedule CT scan in early May 2025 to assess treatment response. -Refer to Dr. [name] for review of scan and discussion of potential drug therapy options if indicated, but performance status may be prohibitive. -Present scan at tumor board for comprehensive review. I will see him back PRN  Dental Health Patient plans to have all teeth removed. -Advise dentist to contact radiation oncologist to discuss radiation exposure to jaw prior to extraction.  Left Arm Weakness Patient reports ongoing weakness in left arm and hand, wearing a splint for contracture. -Encourage continued use of splint and weight exercises to improve strength.  Edema Patient reports occasional ankle swelling. -Advise to take prescribed diuretic as needed for symptomatic relief.  Seizure Cause of recent seizure unknown, follow-up with neurology scheduled. -Continue monitoring and follow-up with neurology as planned.  General Health Maintenance -Consider follow-up with palliative care team to ensure balance between aggressive treatment and quality of life.  Patient is up to date on thyroid labwork, continue as clinically indicated or annually with PCP. Lab Results  Component Value Date   TSH 1.760 02/26/2023     On date of service, in total, I spent 60 minutes on this encounter. Patient was seen in person. _____________________________________   Lonie Peak, MD

## 2023-04-02 NOTE — Progress Notes (Signed)
Mr. Fintan Grater presents today for a follow up appointment    Pain issues, if any: Patient denies any throat pain. Using a feeding tube?: None.  Weight changes, if any: Patient has been gaining weight. Patient has been eating three meals a day. Swallowing issues, if any: Patient denies issues with swallowing. Patient is having some issues with swallowing. Smoking or chewing tobacco? Patient currently doesn't smoke. Using fluoride toothpaste daily? Yes Last ENT visit was on: Patient last seen ENT last year. Other notable issues, if any: None     Patient feels like he is progressing well after treatment.  B/P 160/99 Temp 97.8 oral Pulse 91 Oxygen saturations 96% on room air Respirations 20     Wt Readings from Last 3 Encounters:  04/02/23 175 lb 9.6 oz (79.7 kg)  03/25/23 169 lb (76.7 kg)  03/22/23 169 lb 3.2 oz (76.7 kg)

## 2023-04-03 ENCOUNTER — Ambulatory Visit: Payer: Medicare HMO | Admitting: Radiation Oncology

## 2023-04-03 ENCOUNTER — Other Ambulatory Visit: Payer: Self-pay

## 2023-04-03 DIAGNOSIS — M25642 Stiffness of left hand, not elsewhere classified: Secondary | ICD-10-CM | POA: Diagnosis not present

## 2023-04-03 DIAGNOSIS — Z8589 Personal history of malignant neoplasm of other organs and systems: Secondary | ICD-10-CM

## 2023-04-03 DIAGNOSIS — C3432 Malignant neoplasm of lower lobe, left bronchus or lung: Secondary | ICD-10-CM

## 2023-04-03 DIAGNOSIS — C01 Malignant neoplasm of base of tongue: Secondary | ICD-10-CM

## 2023-04-04 ENCOUNTER — Telehealth: Payer: Self-pay | Admitting: Hematology and Oncology

## 2023-04-04 DIAGNOSIS — M6259 Muscle wasting and atrophy, not elsewhere classified, multiple sites: Secondary | ICD-10-CM | POA: Diagnosis not present

## 2023-04-04 DIAGNOSIS — I480 Paroxysmal atrial fibrillation: Secondary | ICD-10-CM | POA: Diagnosis not present

## 2023-04-04 DIAGNOSIS — R1312 Dysphagia, oropharyngeal phase: Secondary | ICD-10-CM | POA: Diagnosis not present

## 2023-04-04 DIAGNOSIS — G4089 Other seizures: Secondary | ICD-10-CM | POA: Diagnosis not present

## 2023-04-04 DIAGNOSIS — Z8673 Personal history of transient ischemic attack (TIA), and cerebral infarction without residual deficits: Secondary | ICD-10-CM | POA: Diagnosis not present

## 2023-04-04 DIAGNOSIS — I69354 Hemiplegia and hemiparesis following cerebral infarction affecting left non-dominant side: Secondary | ICD-10-CM | POA: Diagnosis not present

## 2023-04-04 DIAGNOSIS — Z741 Need for assistance with personal care: Secondary | ICD-10-CM | POA: Diagnosis not present

## 2023-04-04 DIAGNOSIS — R262 Difficulty in walking, not elsewhere classified: Secondary | ICD-10-CM | POA: Diagnosis not present

## 2023-04-04 DIAGNOSIS — R41841 Cognitive communication deficit: Secondary | ICD-10-CM | POA: Diagnosis not present

## 2023-04-04 NOTE — Telephone Encounter (Signed)
Left patient and patient's son a voicemail in regards to scheduled appointment times/dates, left callback number for scheduling if needing to change any times/dates

## 2023-04-04 NOTE — Telephone Encounter (Unsigned)
Copied from CRM [id number]. Topic: Clinical - Medical Advice >> Apr 04, 2023 11:43 AM Gildardo Pounds wrote: Reason for CRM: Patient is currently in [facility] and he is ready to leave. Patient wants to speak with Dr Tanya Nones. Callback number is [phone]

## 2023-04-04 NOTE — Telephone Encounter (Signed)
Pls see CRM msg and advise?

## 2023-04-05 ENCOUNTER — Ambulatory Visit: Payer: Medicare HMO | Attending: Physician Assistant | Admitting: Physician Assistant

## 2023-04-05 NOTE — Progress Notes (Signed)
This encounter was created in error - please disregard.

## 2023-04-05 NOTE — Telephone Encounter (Signed)
Attempted to call pt re: pt's msg. No answer, LVM for pt to call back.

## 2023-04-11 NOTE — Telephone Encounter (Signed)
Attempted 2nd time to call pt re: pt's msg. No answer, LVM for pt to call back.

## 2023-04-12 DIAGNOSIS — I5020 Unspecified systolic (congestive) heart failure: Secondary | ICD-10-CM | POA: Diagnosis not present

## 2023-04-12 DIAGNOSIS — N183 Chronic kidney disease, stage 3 unspecified: Secondary | ICD-10-CM | POA: Diagnosis not present

## 2023-04-12 DIAGNOSIS — I10 Essential (primary) hypertension: Secondary | ICD-10-CM | POA: Diagnosis not present

## 2023-04-12 DIAGNOSIS — Z8673 Personal history of transient ischemic attack (TIA), and cerebral infarction without residual deficits: Secondary | ICD-10-CM | POA: Diagnosis not present

## 2023-04-12 DIAGNOSIS — I4891 Unspecified atrial fibrillation: Secondary | ICD-10-CM | POA: Diagnosis not present

## 2023-04-12 DIAGNOSIS — E119 Type 2 diabetes mellitus without complications: Secondary | ICD-10-CM | POA: Diagnosis not present

## 2023-04-12 DIAGNOSIS — I5030 Unspecified diastolic (congestive) heart failure: Secondary | ICD-10-CM | POA: Diagnosis not present

## 2023-04-12 DIAGNOSIS — F322 Major depressive disorder, single episode, severe without psychotic features: Secondary | ICD-10-CM | POA: Diagnosis not present

## 2023-04-12 DIAGNOSIS — I442 Atrioventricular block, complete: Secondary | ICD-10-CM | POA: Diagnosis not present

## 2023-04-15 DIAGNOSIS — M25642 Stiffness of left hand, not elsewhere classified: Secondary | ICD-10-CM | POA: Diagnosis not present

## 2023-04-18 ENCOUNTER — Ambulatory Visit (INDEPENDENT_AMBULATORY_CARE_PROVIDER_SITE_OTHER): Payer: Medicare HMO | Admitting: Neurology

## 2023-04-18 ENCOUNTER — Encounter: Payer: Self-pay | Admitting: Neurology

## 2023-04-18 VITALS — BP 169/87 | HR 109 | Ht 69.0 in | Wt 173.0 lb

## 2023-04-18 DIAGNOSIS — Z5181 Encounter for therapeutic drug level monitoring: Secondary | ICD-10-CM

## 2023-04-18 DIAGNOSIS — F322 Major depressive disorder, single episode, severe without psychotic features: Secondary | ICD-10-CM | POA: Diagnosis not present

## 2023-04-18 DIAGNOSIS — I4891 Unspecified atrial fibrillation: Secondary | ICD-10-CM | POA: Diagnosis not present

## 2023-04-18 DIAGNOSIS — N183 Chronic kidney disease, stage 3 unspecified: Secondary | ICD-10-CM | POA: Diagnosis not present

## 2023-04-18 DIAGNOSIS — E119 Type 2 diabetes mellitus without complications: Secondary | ICD-10-CM | POA: Diagnosis not present

## 2023-04-18 DIAGNOSIS — I5030 Unspecified diastolic (congestive) heart failure: Secondary | ICD-10-CM | POA: Diagnosis not present

## 2023-04-18 DIAGNOSIS — G40109 Localization-related (focal) (partial) symptomatic epilepsy and epileptic syndromes with simple partial seizures, not intractable, without status epilepticus: Secondary | ICD-10-CM | POA: Diagnosis not present

## 2023-04-18 DIAGNOSIS — I442 Atrioventricular block, complete: Secondary | ICD-10-CM | POA: Diagnosis not present

## 2023-04-18 DIAGNOSIS — Z8673 Personal history of transient ischemic attack (TIA), and cerebral infarction without residual deficits: Secondary | ICD-10-CM | POA: Diagnosis not present

## 2023-04-18 DIAGNOSIS — I5020 Unspecified systolic (congestive) heart failure: Secondary | ICD-10-CM | POA: Diagnosis not present

## 2023-04-18 DIAGNOSIS — I10 Essential (primary) hypertension: Secondary | ICD-10-CM | POA: Diagnosis not present

## 2023-04-18 MED ORDER — LEVETIRACETAM 500 MG PO TABS: 500.0000 mg | ORAL_TABLET | Freq: Two times a day (BID) | ORAL | 3 refills | Status: DC

## 2023-04-18 NOTE — Progress Notes (Signed)
 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  Patient is a poor historian.  He tells me that he had his first ever seizure in November 2024, he described as feeling stiff fell back for about 30 seconds.  He was put on medication and has been doing well since but per chart review indicate that he does have a history of seizures, was off medication in 2016.  He did well until November 2024 when he did have a breakthrough seizure.  He has been restarted on Keppra and tolerating the medication very well.  Has not had any additional seizure. Per chart review, his seizure was described as being very stiff when eating breakfast with daughter, shaking for almost 45 minutes, there were no incontinence.  Took him a couple minutes to come back to baseline.   Handedness: Right handed   Onset: Unclear   Seizure Type: Convulsion   Current frequency: Last seizure November 2024  Any injuries from seizures: Denies  Seizure risk factors: Right MCA stroke  Previous ASMs: Unclear patient does not remember  Currenty ASMs: Levetiracetam 500 mg twice daily  ASMs side effects: Denies  Brain Images: no seizure focus identified.   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  ALLERGIES: Allergies  Allergen Reactions   Codeine Other (See Comments)    Unknown reaction, severe headach   Metformin And Related Diarrhea    HOME MEDICATIONS: Outpatient Medications Prior to Visit  Medication Sig Dispense Refill   apixaban (ELIQUIS) 5 MG TABS tablet Take 1 tablet (5 mg total) by mouth 2 (two) times daily. 60 tablet 5   atorvastatin (LIPITOR) 20 MG tablet Take 1 tablet (20 mg total) by mouth daily. (Patient taking differently: Take 20 mg by mouth at bedtime.) 90 tablet 3   diltiazem (CARDIZEM CD) 180 MG 24 hr capsule Take 1 capsule (180 mg total) by mouth daily. 60 capsule 0   insulin aspart (NOVOLOG FLEXPEN) 100 UNIT/ML FlexPen 0-9 Units, Subcutaneous, 3 times daily with meals CBG < 70: Implement Hypoglycemia measures CBG 70 - 120: 0 units CBG 121 - 150: 1 unit CBG 151 - 200: 2 units CBG 201 - 250: 3 units CBG 251 - 300: 5 units CBG 301 - 350: 7 units CBG 351 - 400: 9 units CBG > 400: call MD     metoprolol succinate (TOPROL XL) 50 MG 24 hr tablet Take 1 tablet (50 mg total) by mouth daily. Start on 1/17 Take with or immediately following a meal.     pantoprazole (PROTONIX) 40 MG tablet Take 1 tablet (40 mg total) by mouth 2 (two) times daily before a  meal. 120 tablet 0   polyethylene glycol (MIRALAX / GLYCOLAX) 17 g packet Take 17 g by mouth daily as needed for mild constipation or moderate constipation.     levETIRAcetam (KEPPRA) 500 MG tablet Take 1 tablet (500 mg total) by mouth 2 (two) times daily.     metoprolol tartrate (LOPRESSOR) 25 MG tablet Take 25 mg by mouth 2 (two) times daily.     Continuous Glucose Receiver (DEXCOM G7 RECEIVER) DEVI Use to check blood sugar continuously 1 each 0   Continuous Glucose Sensor (DEXCOM G7 SENSOR) MISC Change  sensor every 10 days 1 each 0   glucose blood test strip 1 each by Other route as needed for other. Use as instructed 100 each 11   Magnesium 200 MG TABS Take 400 mg by mouth daily.     No facility-administered medications prior to visit.    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 [name], laser therapy OD   Seizures (HCC)    pt reports this is from low blood sugar    PAST SURGICAL HISTORY: Past Surgical History:  Procedure Laterality Date   BIOPSY  03/01/2023   Procedure: BIOPSY;  Surgeon: Napoleon Form, MD;  Location: [facility]COPY;  Service: Gastroenterology;;   BRONCHIAL NEEDLE ASPIRATION BIOPSY  10/30/2022   Procedure: BRONCHIAL NEEDLE ASPIRATION BIOPSIES;  Surgeon: Josephine Igo, DO;  Location: [facility];  Service: Cardiopulmonary;;   COLONOSCOPY     DIRECT LARYNGOSCOPY N/A 02/10/2020   Procedure: DIRECT LARYNGOSCOPY WITH BIOPSY;  Surgeon: Newman Pies, MD;  Location: [facility] OR;  Service: ENT;  Laterality: N/A;   Ear surgery as a child     ESOPHAGOGASTRODUODENOSCOPY N/A 03/01/2023   Procedure: ESOPHAGOGASTRODUODENOSCOPY (EGD);  Surgeon: Napoleon Form, MD;  Location: [facility] ENDOSCOPY;  Service: Gastroenterology;  Laterality: N/A;   IR GASTROSTOMY TUBE MOD SED  03/23/2020   IR GASTROSTOMY TUBE REMOVAL  06/01/2020   IR IMAGING GUIDED PORT INSERTION  03/09/2020   IR REMOVAL TUN ACCESS W/ PORT W/O FL MOD SED  02/08/2021   TEMPORARY PACEMAKER N/A 02/26/2023   Procedure: TEMPORARY PACEMAKER;  Surgeon: Marykay Lex, MD;  Location: [facility] INVASIVE CV LAB;  Service: Cardiovascular;  Laterality: N/A;    THORACENTESIS  10/30/2022   Procedure: THORACENTESIS;  Surgeon: Josephine Igo, DO;  Location: [facility] ENDOSCOPY;  Service: Cardiopulmonary;;  L pleural effusion   TONSILLECTOMY     VIDEO BRONCHOSCOPY Left 10/30/2022   Procedure: VIDEO BRONCHOSCOPY WITHOUT FLUORO;  Surgeon: Josephine Igo, DO;  Location: [facility] ENDOSCOPY;  Service: Cardiopulmonary;  Laterality: Left;   VIDEO BRONCHOSCOPY WITH ENDOBRONCHIAL ULTRASOUND Bilateral 10/30/2022   Procedure: VIDEO BRONCHOSCOPY WITH ENDOBRONCHIAL ULTRASOUND;  Surgeon: Josephine Igo, DO;  Location: [facility]OS[facility];  Service: Cardiopulmonary;  Laterality: Bilateral;    FAMILY HISTORY: Family History  Problem Relation Age of Onset   Colon cancer Mother    Hyperlipidemia Father    Hypertension Father    Breast cancer Sister    Coronary artery disease Neg Hx  no early CAD.   Stomach cancer Neg Hx    Rectal cancer Neg Hx    Esophageal cancer Neg Hx     SOCIAL HISTORY: Social History   Socioeconomic History   Marital status: Divorced    Spouse name: Not on file   Number of children: 2   Years of education: 12   Highest education level: 12th grade  Occupational History   Occupation: Disabled  Tobacco Use   Smoking status: Never    Passive exposure: Never   Smokeless tobacco: Never   Tobacco comments:    Verified by son, Fermon Ureta  Vaping Use   Vaping status: Never Used  Substance and Sexual Activity   Alcohol use: Yes    Alcohol/week: 0.0 standard drinks of alcohol    Comment: seldom   Drug use: Not Currently    Frequency: 7.0 times per week    Types: Marijuana    Comment: marijauna for arthritis, last 03-13-20   Sexual activity: Not Currently    Partners: Female  Other Topics Concern   Not on file  Social History Narrative   Two children.  5 grandchildren.     Social Drivers of Corporate investment banker Strain: Low Risk  (04/05/2022)   Overall Financial Resource Strain (CARDIA)    Difficulty of Paying Living Expenses:  Not hard at all  Food Insecurity: No Food Insecurity (03/01/2023)   Hunger Vital Sign    Worried About Running Out of Food in the Last Year: Never true    Ran Out of Food in the Last Year: Never true  Transportation Needs: Unmet Transportation Needs (03/01/2023)   PRAPARE - Transportation    Lack of Transportation (Medical): Yes    Lack of Transportation (Non-Medical): Yes  Physical Activity: Inactive (04/05/2022)   Exercise Vital Sign    Days of Exercise per Week: 0 days    Minutes of Exercise per Session: 0 min  Stress: No Stress Concern Present (04/05/2022)   [employer] of Occupational Health - Occupational Stress Questionnaire    Feeling of Stress : Not at all  Social Connections: Socially Isolated (03/01/2023)   Social Connection and Isolation Panel [NHANES]    Frequency of Communication with Friends and Family: More than three times a week    Frequency of Social Gatherings with Friends and Family: Patient unable to answer    Attends Religious Services: Never    Database administrator or Organizations: No    Attends Banker Meetings: Never    Marital Status: Divorced  Catering manager Violence: Not At Risk (03/01/2023)   Humiliation, Afraid, Rape, and Kick questionnaire    Fear of Current or Ex-Partner: No    Emotionally Abused: No    Physically Abused: No    Sexually Abused: No    PHYSICAL EXAM  GENERAL EXAM/CONSTITUTIONAL: Vitals:  Vitals:   04/18/23 0950  BP: (!) 169/87  Pulse: (!) 109  Weight: 173 lb (78.5 kg)  Height: 5\' 9"  (1.753 m)   Body mass index is 25.55 kg/m. Wt Readings from Last 3 Encounters:  04/18/23 173 lb (78.5 kg)  04/02/23 175 lb 9.6 oz (79.7 kg)  03/25/23 169 lb (76.7 kg)   Patient is in no distress; well developed, nourished and groomed; neck is supple  MUSCULOSKELETAL: Gait, strength, tone, movements noted in Neurologic exam below  NEUROLOGIC: MENTAL STATUS:      No data to display         awake, alert, oriented  to person, place and time recent and remote memory intact normal attention and concentration language fluent, comprehension intact, naming intact fund of knowledge appropriate  CRANIAL NERVE:  2nd, 3rd, 4th, 6th - Visual fields full to confrontation, extraocular muscles intact, no nystagmus 5th - facial sensation symmetric 7th - facial strength symmetric 8th - hearing intact 9th - palate elevates symmetrically, uvula midline 11th - shoulder shrug symmetric 12th - tongue protrusion midline  MOTOR:  normal bulk and tone, full strength in the RUE, BLE. LUE is limited due to chronic right MCA stroke   SENSORY:  normal and symmetric to light touch  COORDINATION:  finger-nose-finger on the right   GAIT/STATION:  normal     DIAGNOSTIC DATA (LABS, IMAGING, TESTING) - I reviewed patient records, labs, notes, testing and imaging myself where available.  Lab Results  Component Value Date   WBC 4.1 03/07/2023   HGB 9.5 (L) 03/07/2023   HCT 29.2 (L) 03/07/2023   MCV 90.7 03/07/2023   PLT 148 (L) 03/07/2023      Component Value Date/Time   NA 132 (L) 03/07/2023 0226   NA 139 09/01/2021 1014   K 4.1 03/07/2023 0226   CL 104 03/07/2023 0226   CO2 24 03/07/2023 0226   GLUCOSE 154 (H) 03/07/2023 0226   BUN 32 (H) 03/07/2023 0226   BUN 17 09/01/2021 1014   CREATININE 1.70 (H) 03/07/2023 0226   CREATININE 1.64 (H) 01/01/2023 1020   CREATININE 1.70 (H) 11/29/2022 1423   CALCIUM 8.2 (L) 03/07/2023 0226   PROT 5.3 (L) 02/26/2023 1551   ALBUMIN 2.7 (L) 03/03/2023 0218   AST 13 (L) 02/26/2023 1551   AST 14 (L) 03/20/2021 0909   ALT 18 02/26/2023 1551   ALT 13 03/20/2021 0909   ALKPHOS 60 02/26/2023 1551   BILITOT 0.6 02/26/2023 1551   BILITOT 0.3 03/20/2021 0909   GFRNONAA 43 (L) 03/07/2023 0226   GFRNONAA 45 (L) 01/01/2023 1020   GFRNONAA 61 11/30/2019 0915   GFRAA 70 11/30/2019 0915   Lab Results  Component Value Date   CHOL 124 10/16/2022   HDL 54 10/16/2022    LDLCALC 55 10/16/2022   TRIG 73 10/16/2022   Lab Results  Component Value Date   HGBA1C 6.6 (H) 03/02/2023   Lab Results  Component Value Date   VITAMINB12 658 02/28/2021   Lab Results  Component Value Date   TSH 1.760 02/26/2023   Head CT 02/26/2023 1. No evidence of acute intracranial abnormality. MRI could provide more sensitive evaluation for acute infarct if clinically warranted. 2. Remote infarcts and advanced chronic microvascular ischemic change. 3. Chronic right mastoid effusion.  MRI Brain 01/07/2023 1. No acute intracranial process. No seizure focus is identified. No evidence of acute or subacute infarct 2. Loss of the right vertebral artery flow void, which appears unchanged from the 2014 MRI, and is favored to be chronic. 3. Fluid in the right mastoid and middle ear. Correlate for otomastoiditis   EEG 01/08/2023: Normal     ASSESSMENT AND PLAN  68 y.o. 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 He is back on Keppra and has been doing well since.  Plan will be to obtain a Keppra level and keep patient on Keppra 500 mg twice daily.  Advised him to contact me if he does have another breakthrough seizure.  Return in 1 year or sooner if worse.   1. Focal epilepsy (HCC)   2. Therapeutic drug monitoring     Patient Instructions  Continue with Keppra 500 mg twice daily  Continue your other medications  Return in a year    Per [facility] statutes, patients with seizures are not allowed to drive until they have been seizure-free for six months.  Other recommendations include using caution when using heavy equipment or power tools. Avoid working on ladders or at heights. Take showers instead of baths.  Do not swim alone.  Ensure the water temperature is not too  high on the home water heater. Do not go swimming alone. Do not lock yourself in a room alone (i.e. bathroom). When caring for infants or small children, sit down when holding, feeding, or changing them to minimize risk of injury to the child in the event you have a seizure. Maintain good sleep hygiene. Avoid alcohol.  Also recommend adequate sleep, hydration, good diet and minimize stress.   During the Seizure  - First, ensure adequate ventilation and place patients on the floor on their left side  Loosen clothing around the neck and ensure the airway is patent. If the patient is clenching the teeth, do not force the mouth open with any object as this can cause severe damage - Remove all items from the surrounding that can be hazardous. The patient may be oblivious to what's happening and may not even know what he or she is doing. If the patient is confused and wandering, either gently guide him/her away and block access to outside areas - Reassure the individual and be comforting - Call 911. In most cases, the seizure ends before EMS arrives. However, there are cases when seizures may last over 3 to 5 minutes. Or the individual may have developed breathing difficulties or severe injuries. If a pregnant patient or a person with diabetes develops a seizure, it is prudent to call an ambulance. - Finally, if the patient does not regain full consciousness, then call EMS. Most patients will remain confused for about 45 to 90 minutes after a seizure, so you must use judgment in calling for help. - Avoid restraints but make sure the patient is in a bed with padded side rails - Place the individual in a lateral position with the neck slightly flexed; this will help the saliva drain from the mouth and prevent the tongue from falling backward - Remove all nearby furniture and other hazards from the area - Provide verbal assurance as the individual is regaining consciousness - Provide the patient with privacy  if possible - Call for help and start treatment as ordered by the caregiver   After the Seizure (Postictal Stage)  After a seizure, most patients experience confusion, fatigue, muscle pain and/or a headache. Thus, one should permit the individual to sleep. For the next few days, reassurance is essential. Being calm and helping reorient the person is also of importance.  Most seizures are painless and end spontaneously. Seizures are not harmful to others but can lead to complications such as stress on the lungs, brain and the heart. Individuals with prior lung problems may develop labored breathing and respiratory distress.    Discussed Patients with epilepsy have a small risk of sudden unexpected death, a condition referred to as sudden unexpected death in epilepsy (SUDEP). SUDEP is defined specifically as the sudden, unexpected, witnessed or unwitnessed, nontraumatic and nondrowning death in patients with epilepsy with or without  evidence for a seizure, and excluding documented status epilepticus, in which post mortem examination does not reveal a structural or toxicologic cause for death     Orders Placed This Encounter  Procedures   Levetiracetam level    Meds ordered this encounter  Medications   levETIRAcetam (KEPPRA) 500 MG tablet    Sig: Take 1 tablet (500 mg total) by mouth 2 (two) times daily.    Dispense:  180 tablet    Refill:  3    Return in about 1 year (around 04/17/2024).    Windell Norfolk, MD 04/18/2023, 4:55 PM  [facility] [address] [place], [place] [address] [phone]

## 2023-04-18 NOTE — Patient Instructions (Signed)
 Continue with Keppra 500 mg twice daily  Continue your other medications  Return in a year

## 2023-04-19 ENCOUNTER — Telehealth: Payer: Self-pay

## 2023-04-19 ENCOUNTER — Telehealth: Payer: Self-pay | Admitting: Family Medicine

## 2023-04-19 ENCOUNTER — Encounter: Payer: Self-pay | Admitting: Family Medicine

## 2023-04-19 LAB — LEVETIRACETAM LEVEL: Levetiracetam Lvl: 32 ug/mL (ref 10.0–40.0)

## 2023-04-19 NOTE — Telephone Encounter (Signed)
 Keppra refilled 04/18/23. Refills available at pharmacy. Attempted to notify patient. LVM.    Copied from CRM [id number]. Topic: Clinical - Medication Question >> Apr 19, 2023  8:05 AM [name] wrote: Reason for CRM: Brian [name] is out of his anti-seizure medication. He did not know the name of the medication. He was given medication at [facility] after going to hospital for ant-seizure. [facility]. He has an appointment Monday with Dr. Tanya Nones. >> Apr 19, 2023  8:16 AM [name] wrote: He is out of the medication and is afraid he will have a seizure

## 2023-04-19 NOTE — Transitions of Care (Post Inpatient/ED Visit) (Signed)
 04/19/2023  Name: Brian [name] MRN: [id number] DOB: 08/01/1955  Today's TOC FU Call Status: Today's TOC FU Call Status:: Successful TOC FU Call Completed TOC FU Call Complete Date: 04/19/23 Patient's Name and Date of Birth confirmed.  Transition Care Management Follow-up Telephone Call Date of Discharge: 04/18/23 Discharge Facility:  [facility]) Type of Discharge:  (SNF) How have you been since you were released from the hospital?: Better Any questions or concerns?: No  Items Reviewed: Did you receive and understand the discharge instructions provided?: Yes Medications obtained,verified, and reconciled?: Yes (Medications Reviewed) Any new allergies since your discharge?: No Dietary orders reviewed?: NA Do you have support at home?: Yes People in Home: child(ren), adult  Medications Reviewed Today: Medications Reviewed Today     Reviewed by Leigh Aurora, CMA (Certified Medical Assistant) on 04/19/23 at 0840  Med List Status: <None>   Medication Order Taking? Sig Documenting Provider Last Dose Status Informant  apixaban (ELIQUIS) 5 MG TABS tablet [id number] No Take 1 tablet (5 mg total) by mouth 2 (two) times daily. [name], MD Taking Active Pharmacy Records, Nursing Home Medication Administration Guide (MAG)  atorvastatin (LIPITOR) 20 MG tablet [id number] No Take 1 tablet (20 mg total) by mouth daily.  Patient taking differently: Take 20 mg by mouth at bedtime.   [name], MD Taking Active Pharmacy Records, Nursing Home Medication Administration Guide (MAG)  diltiazem (CARDIZEM CD) 180 MG 24 hr capsule [id number] No Take 1 capsule (180 mg total) by mouth daily. Burnadette Pop, MD Taking Active   insulin aspart (NOVOLOG FLEXPEN) 100 UNIT/ML FlexPen [id number] No 0-9 Units, Subcutaneous, 3 times daily with meals CBG < 70: Implement Hypoglycemia measures CBG 70 - 120: 0 units CBG 121 - 150: 1 unit CBG 151 - 200: 2 units CBG 201 - 250: 3  units CBG 251 - 300: 5 units CBG 301 - 350: 7 units CBG 351 - 400: 9 units CBG > 400: call MD Maretta Bees, MD Taking Active Nursing Home Medication Administration Guide (MAG), Pharmacy Records  levETIRAcetam (KEPPRA) 500 MG tablet [id number]  Take 1 tablet (500 mg total) by mouth 2 (two) times daily. Windell Norfolk, MD  Active   metoprolol succinate (TOPROL XL) 50 MG 24 hr tablet [id number] No Take 1 tablet (50 mg total) by mouth daily. Start on 1/17 Take with or immediately following a meal. Burnadette Pop, MD Taking Active   pantoprazole (PROTONIX) 40 MG tablet [id number] No Take 1 tablet (40 mg total) by mouth 2 (two) times daily before a meal. Burnadette Pop, MD Taking Active   polyethylene glycol (MIRALAX / GLYCOLAX) 17 g packet [id number] No Take 17 g by mouth daily as needed for mild constipation or moderate constipation. [provider] Taking Active Nursing Home Medication Administration Guide [facility]), Pharmacy Records           Med Note [place], Laverle Patter   Fri Mar 22, 2023  8:47 AM) Pt Takes as needed.  Med List Note Kandis Cocking Ronnald Nian, CPhT 02/26/23 1709): [facility] [phone]            Home Care and Equipment/Supplies: Were Home Health Services Ordered?: No Any new equipment or medical supplies ordered?: No  Functional Questionnaire: Do you need assistance with bathing/showering or dressing?: No Do you need assistance with meal preparation?: No Do you need assistance with eating?: No Do you have difficulty maintaining continence: No Do you need assistance with getting  out of bed/getting out of a chair/moving?: No Do you have difficulty managing or taking your medications?: No  Follow up appointments reviewed: PCP Follow-up appointment confirmed?: Yes Date of PCP follow-up appointment?: 04/22/23 Follow-up Provider: Dr. Broadus [facility] Follow-up appointment confirmed?: NA Do you need transportation to your follow-up  appointment?: No Do you understand care options if your condition(s) worsen?: Yes-patient verbalized understanding    SIGNATURE Agnes Lawrence, CMA (AAMA)  [facility] [phone]

## 2023-04-21 DIAGNOSIS — N1832 Chronic kidney disease, stage 3b: Secondary | ICD-10-CM | POA: Diagnosis not present

## 2023-04-21 DIAGNOSIS — I130 Hypertensive heart and chronic kidney disease with heart failure and stage 1 through stage 4 chronic kidney disease, or unspecified chronic kidney disease: Secondary | ICD-10-CM | POA: Diagnosis not present

## 2023-04-21 DIAGNOSIS — M791 Myalgia, unspecified site: Secondary | ICD-10-CM | POA: Diagnosis not present

## 2023-04-21 DIAGNOSIS — I442 Atrioventricular block, complete: Secondary | ICD-10-CM | POA: Diagnosis not present

## 2023-04-21 DIAGNOSIS — E1122 Type 2 diabetes mellitus with diabetic chronic kidney disease: Secondary | ICD-10-CM | POA: Diagnosis not present

## 2023-04-21 DIAGNOSIS — G40909 Epilepsy, unspecified, not intractable, without status epilepticus: Secondary | ICD-10-CM | POA: Diagnosis not present

## 2023-04-21 DIAGNOSIS — I69354 Hemiplegia and hemiparesis following cerebral infarction affecting left non-dominant side: Secondary | ICD-10-CM | POA: Diagnosis not present

## 2023-04-21 DIAGNOSIS — I5042 Chronic combined systolic (congestive) and diastolic (congestive) heart failure: Secondary | ICD-10-CM | POA: Diagnosis not present

## 2023-04-21 DIAGNOSIS — I480 Paroxysmal atrial fibrillation: Secondary | ICD-10-CM | POA: Diagnosis not present

## 2023-04-22 ENCOUNTER — Encounter: Payer: Self-pay | Admitting: Family Medicine

## 2023-04-22 ENCOUNTER — Encounter: Payer: Self-pay | Admitting: Neurology

## 2023-04-22 ENCOUNTER — Telehealth: Payer: Self-pay | Admitting: Cardiology

## 2023-04-22 ENCOUNTER — Telehealth: Payer: Self-pay

## 2023-04-22 ENCOUNTER — Ambulatory Visit (INDEPENDENT_AMBULATORY_CARE_PROVIDER_SITE_OTHER): Payer: Medicare HMO | Admitting: Family Medicine

## 2023-04-22 VITALS — BP 118/72 | HR 118 | Temp 98.4°F | Ht 69.0 in | Wt 168.4 lb

## 2023-04-22 DIAGNOSIS — D649 Anemia, unspecified: Secondary | ICD-10-CM | POA: Diagnosis not present

## 2023-04-22 DIAGNOSIS — I4891 Unspecified atrial fibrillation: Secondary | ICD-10-CM | POA: Diagnosis not present

## 2023-04-22 DIAGNOSIS — I130 Hypertensive heart and chronic kidney disease with heart failure and stage 1 through stage 4 chronic kidney disease, or unspecified chronic kidney disease: Secondary | ICD-10-CM | POA: Diagnosis not present

## 2023-04-22 DIAGNOSIS — K922 Gastrointestinal hemorrhage, unspecified: Secondary | ICD-10-CM | POA: Diagnosis not present

## 2023-04-22 DIAGNOSIS — I442 Atrioventricular block, complete: Secondary | ICD-10-CM | POA: Diagnosis not present

## 2023-04-22 DIAGNOSIS — G40909 Epilepsy, unspecified, not intractable, without status epilepticus: Secondary | ICD-10-CM | POA: Diagnosis not present

## 2023-04-22 DIAGNOSIS — N1832 Chronic kidney disease, stage 3b: Secondary | ICD-10-CM

## 2023-04-22 DIAGNOSIS — E1169 Type 2 diabetes mellitus with other specified complication: Secondary | ICD-10-CM

## 2023-04-22 DIAGNOSIS — I5042 Chronic combined systolic (congestive) and diastolic (congestive) heart failure: Secondary | ICD-10-CM | POA: Diagnosis not present

## 2023-04-22 DIAGNOSIS — E1122 Type 2 diabetes mellitus with diabetic chronic kidney disease: Secondary | ICD-10-CM | POA: Diagnosis not present

## 2023-04-22 DIAGNOSIS — I480 Paroxysmal atrial fibrillation: Secondary | ICD-10-CM | POA: Diagnosis not present

## 2023-04-22 DIAGNOSIS — M791 Myalgia, unspecified site: Secondary | ICD-10-CM | POA: Diagnosis not present

## 2023-04-22 DIAGNOSIS — I69354 Hemiplegia and hemiparesis following cerebral infarction affecting left non-dominant side: Secondary | ICD-10-CM | POA: Diagnosis not present

## 2023-04-22 MED ORDER — METOPROLOL SUCCINATE ER 50 MG PO TB24: 50.0000 mg | ORAL_TABLET | Freq: Every day | ORAL | 1 refills | Status: DC

## 2023-04-22 MED ORDER — PANTOPRAZOLE SODIUM 40 MG PO TBEC: 40.0000 mg | DELAYED_RELEASE_TABLET | Freq: Every day | ORAL | 3 refills | Status: DC

## 2023-04-22 MED ORDER — DILTIAZEM HCL ER COATED BEADS 180 MG PO CP24: 180.0000 mg | ORAL_CAPSULE | Freq: Every day | ORAL | 1 refills | Status: DC

## 2023-04-22 MED ORDER — METOPROLOL SUCCINATE ER 50 MG PO TB24: 50.0000 mg | ORAL_TABLET | Freq: Every day | ORAL | 3 refills | Status: DC

## 2023-04-22 MED ORDER — DILTIAZEM HCL ER COATED BEADS 180 MG PO CP24: 180.0000 mg | ORAL_CAPSULE | Freq: Every day | ORAL | 3 refills | Status: DC

## 2023-04-22 NOTE — Telephone Encounter (Signed)
 Brian [name] states:   -patient started care with [facility] yesterday   -Patient recently discharged from [facility]   -discharge paperwork from [facility] had a lot of discrepancy   -assessment yesterday showed irregular heart rate 120-130s   -pat was asymptomatic with the elevated heart rate   -patient has not been taking diltiazem, only has a 120mg  dose at home   -patient is taking carvedilol (25mg ) 1.5 tab BID   -would like an updated list of patients cardiac medications  Informed Brian [name]:    -Per 1/31 OV patient is to continue diltiazem 180mg    -Per 1/31 OV patient is to continue Metoprolol S 50mg  daily   -Patient has OV 3/14 with NP [place]   -RN will outreach patient and review symptoms  Brian [name] has no further questions at this time

## 2023-04-22 NOTE — Telephone Encounter (Signed)
 Patient identification verified by 2 forms. Marilynn Rail, RN    Called and spoke to patient  Patient states:  -he will see his PCP this morning   -plans to review medication with PCP  Patient denies:   -SOB   -palpitation    -chest pressure/pain Informed patient:   -per 1/31 OV continue diltiazem 180 mg and Metoprolol S 50mg    -should not be taking both carvedilol and Metoprolol   -keep 3/14 OV for cardiology follow up  Patient verbalized understanding, no questions at this time

## 2023-04-22 NOTE — Telephone Encounter (Signed)
 Pt needs a letter to be excused for [employer]. Form in green folder. Thank you.

## 2023-04-22 NOTE — Telephone Encounter (Signed)
 Copied from CRM [id number]. Topic: Clinical - Home Health Verbal Orders >> Apr 22, 2023  9:54 AM Carlatta H wrote: Caller/Agency: Ival Bible Callback Number: [phone] Service Requested: Skilled Nursing Frequency: 2x a week for week 1//1x a week for 4 weeks Any new concerns about the patient? No  Called [name]   Give verbal orders,"ok" for services needed for pt per PCP

## 2023-04-22 NOTE — Telephone Encounter (Signed)
 Copied from CRM [id number]. Topic: Clinical - Home Health Verbal Orders >> Apr 22, 2023  4:32 PM [place] B wrote: Caller/Agency: [facility] Number: [phone] Service Requested: Physical Therapy Frequency: 1 week 2 2 week 3 1 week 3  Any new concerns about the patient? Yes patient skin peeling

## 2023-04-22 NOTE — Progress Notes (Signed)
 Subjective:    Patient ID: Brian [name], male    DOB: 1955/02/21, 68 y.o.   MRN: [id number]  Patient is a 68 year old Caucasian gentleman with a history of stage IV metastatic squamous cell carcinoma.  Originally was diagnosed at the base of his tongue.  CT scan last year revealed recurrence in the lung.  Currently being treated through radiation oncology.  Was admitted to the hospital in January with upper GI bleed due to gastritis complicated by profound bradycardia.  Also has a history of seizure disorder, stage III chronic kidney disease, type 2 diabetes mellitus, atrial fibrillation, and congestive heart failure:  Admit date: 02/26/2023 Discharge date: 03/07/2023   Admitted From: Home Disposition:  Home   Discharge Condition:Stable CODE STATUS:FULL Diet recommendation:  Dysphagia 3   Brief/Interim Summary: Patient is a 68 year old male with history of A-fib on Eliquis, chronic combined CHF, hypertension, hyperlipidemia, CKD stage IIIb, seizure disorder, CVA with residual left hemiparesis, squamous cell carcinoma of the tongue who presented from skilled nursing facility with confusion, vomiting. On presentation, he was bradycardic and hypotensive. Rhythm was A-flutter with rate in the range of 40s, blood pressure systolic was in the range of 70s. Started on vasopressors and admitted to ICU under PCCM service. Cardiology consulted, transvenous pacemaker placed. Hospital course also remarkable for coffee-ground emesis, anemia, GI consulted. EGD done on 1/10 showed mild gastritis, duodenitis. Transferred to [facility] service on 1/11. Hospital course remarkable for A-fib with RVR , is better today.  Cardiology cleared for discharge.  He will follow-up with cardiology as an outpatient.   Following problems were addressed during the hospitalization:   Symptomatic bradycardia/complete heart block: Initial rhythm was A- flutter with bradycardia.  Status post transvenous pacemaker placement.  Pacemaker  has been discontinued.   A-fib with RVR: Continue Eliquis, long-acting Cardizem.  Monitor on telemetry.  Rate not controlled this morning, with RVR in the range of 100-120s.  Increased the dose of metoprolol.  Heart rate better today.  He will follow-up with cardiology as an outpatient.  Will be in Zio patch on dc   suspect upper GI bleed: Found to have coffee-ground emesis during this hospitalization.  EGD done on 1/10 showed acute gastritis, duodenitis.  Continue Protonix.  Currently hemoglobin in the range of 8.  GI signed off, resumed Eliquis   Chronic combined systolic/diastolic heart failure: Echo has shown EF of 45 to 50%, grade 2 diastolic function.  Currently euvolemic.    Type II diabetes: Was on on Jardiance.Now stopped.  Recent A1c of 6.6.  Continue sliding scale   Hypertension: Presented with hypotension.    Currently on Cardizem, metoprolol.  Blood pressure stable this mrng   History of CVA: Has residual left hemiparesis.  Lives at [facility]. Speech therapy following for dysphagia.  Recommended dysphagia 3 diet   Pancytopenia: Currently hemoglobin in the range of 8.  Has mild leukopenia and thrombocytopenia.  Also given IV iron.   Seizure disorder: On Keppra   Hyperlipidemia: On Lipitor at home   CKD stage IIIb: Baseline creatinine around 1.5-1.7.  Kidney function at baseline   Depression: There was concern for severe depression during this hospitalization.  Psychiatry consulted.  Patient refused antidepressant therapy.  Psychiatry has no contraindication on discharge to previous environment.  He needs to follow-up with psychiatry as an outpatient   Debility/deconditioning: Patient is living at nursing facility.  Discharge planning back to nursing facility    Discharge Diagnoses:  Principal Problem:   Symptomatic bradycardia Active Problems:  Ileus (HCC)   Debility   AKI (acute kidney injury) (HCC)   Adjustment disorder with depressed mood   Marijuana use    Coffee ground emesis   Nausea and vomiting  04/22/23 Saw cardiology 1/31 who recommended stopping metoprolol tartrate and continuing metoprolol succinate.  (Not taking either now for some reason)  Saw neurology 2/27 who recommended no change in keppra dosage as keppra level was therapeutic.  Patient is not taking losartan.  He is not taking hydralazine.  His blood pressure is still well-controlled.  His heart rate is elevated today however he is not taking metoprolol succinate.  He is also only on 120 mg of diltiazem.  He is not taking pantoprazole.  He was unaware that he needed to take that medication given his recent GI bleed.  He is not having to take any insulin.  His recent A1c was 6.6.  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 [name], laser therapy OD   Seizures [facility])    pt reports this is from low blood sugar   Past Surgical History:  Procedure Laterality Date   BIOPSY  03/01/2023   Procedure: BIOPSY;  Surgeon: Napoleon Form, MD;  Location: [facility] ENDOSCOPY;  Service: Gastroenterology;;   BRONCHIAL NEEDLE ASPIRATION BIOPSY  10/30/2022   Procedure: BRONCHIAL NEEDLE ASPIRATION BIOPSIES;  Surgeon: Josephine Igo, DO;  Location: [facility] ENDOSCOPY;  Service: Cardiopulmonary;;   COLONOSCOPY     DIRECT LARYNGOSCOPY N/A 02/10/2020   Procedure: DIRECT LARYNGOSCOPY WITH BIOPSY;  Surgeon: Newman Pies, MD;  Location: [facility] OR;  Service: ENT;  Laterality: N/A;   Ear surgery as a child     ESOPHAGOGASTRODUODENOSCOPY N/A 03/01/2023   Procedure: ESOPHAGOGASTRODUODENOSCOPY (EGD);  Surgeon: Napoleon Form, MD;  Location: [facility] ENDOSCOPY;  Service:  Gastroenterology;  Laterality: N/A;   IR GASTROSTOMY TUBE MOD SED  03/23/2020   IR GASTROSTOMY TUBE REMOVAL  06/01/2020   IR IMAGING GUIDED PORT INSERTION  03/09/2020   IR REMOVAL TUN ACCESS W/ PORT W/O FL MOD SED  02/08/2021   TEMPORARY PACEMAKER N/A 02/26/2023   Procedure: TEMPORARY PACEMAKER;  Surgeon: Marykay Lex, MD;  Location: [facility] INVASIVE CV LAB;  Service: Cardiovascular;  Laterality: N/A;   THORACENTESIS  10/30/2022   Procedure: THORACENTESIS;  Surgeon: Josephine Igo, DO;  Location: [facility] ENDOSCOPY;  Service: Cardiopulmonary;;  L pleural effusion   TONSILLECTOMY     VIDEO BRONCHOSCOPY Left 10/30/2022   Procedure: VIDEO BRONCHOSCOPY WITHOUT FLUORO;  Surgeon: Josephine Igo, DO;  Location: [facility] ENDOSCOPY;  Service: Cardiopulmonary;  Laterality: Left;   VIDEO BRONCHOSCOPY WITH ENDOBRONCHIAL ULTRASOUND Bilateral 10/30/2022   Procedure: VIDEO BRONCHOSCOPY WITH ENDOBRONCHIAL ULTRASOUND;  Surgeon: Josephine Igo, DO;  Location: [facility] ENDOSCOPY;  Service: Cardiopulmonary;  Laterality: Bilateral;   Social History   Socioeconomic History   Marital status: Divorced    Spouse name: Not on file   Number of children: 2   Years of education: 12   Highest education level: 12th grade  Occupational History   Occupation: Disabled  Tobacco Use   Smoking  status: Never    Passive exposure: Never   Smokeless tobacco: Never   Tobacco comments:    Verified by son, Kasson Lamere  Vaping Use   Vaping status: Never Used  Substance and Sexual Activity   Alcohol use: Yes    Alcohol/week: 0.0 standard drinks of alcohol    Comment: seldom   Drug use: Not Currently    Frequency: 7.0 times per week    Types: Marijuana    Comment: marijauna for arthritis, last 03-13-20   Sexual activity: Not Currently    Partners: Female  Other Topics Concern   Not on file  Social History Narrative   Two children.  5 grandchildren.     Social Drivers of Corporate investment banker Strain: Low Risk  (04/05/2022)    Overall Financial Resource Strain (CARDIA)    Difficulty of Paying Living Expenses: Not hard at all  Food Insecurity: No Food Insecurity (03/01/2023)   Hunger Vital Sign    Worried About Running Out of Food in the Last Year: Never true    Ran Out of Food in the Last Year: Never true  Transportation Needs: Unmet Transportation Needs (03/01/2023)   PRAPARE - Administrator, Civil Service (Medical): Yes    Lack of Transportation (Non-Medical): Yes  Physical Activity: Inactive (04/05/2022)   Exercise Vital Sign    Days of Exercise per Week: 0 days    Minutes of Exercise per Session: 0 min  Stress: No Stress Concern Present (04/05/2022)   [employer] of Occupational Health - Occupational Stress Questionnaire    Feeling of Stress : Not at all  Social Connections: Socially Isolated (03/01/2023)   Social Connection and Isolation Panel [NHANES]    Frequency of Communication with Friends and Family: More than three times a week    Frequency of Social Gatherings with Friends and Family: Patient unable to answer    Attends Religious Services: Never    Database administrator or Organizations: No    Attends Banker Meetings: Never    Marital Status: Divorced  Catering manager Violence: Not At Risk (03/01/2023)   Humiliation, Afraid, Rape, and Kick questionnaire    Fear of Current or Ex-Partner: No    Emotionally Abused: No    Physically Abused: No    Sexually Abused: No   Current Outpatient Medications on File Prior to Visit  Medication Sig Dispense Refill   apixaban (ELIQUIS) 5 MG TABS tablet Take 1 tablet (5 mg total) by mouth 2 (two) times daily. 60 tablet 5   atorvastatin (LIPITOR) 20 MG tablet Take 1 tablet (20 mg total) by mouth daily. (Patient taking differently: Take 20 mg by mouth at bedtime.) 90 tablet 3   insulin aspart (NOVOLOG FLEXPEN) 100 UNIT/ML FlexPen 0-9 Units, Subcutaneous, 3 times daily with meals CBG < 70: Implement Hypoglycemia measures CBG 70 -  120: 0 units CBG 121 - 150: 1 unit CBG 151 - 200: 2 units CBG 201 - 250: 3 units CBG 251 - 300: 5 units CBG 301 - 350: 7 units CBG 351 - 400: 9 units CBG > 400: call MD     levETIRAcetam (KEPPRA) 500 MG tablet Take 1 tablet (500 mg total) by mouth 2 (two) times daily. 180 tablet 3   pantoprazole (PROTONIX) 40 MG tablet Take 1 tablet (40 mg total) by mouth 2 (two) times daily before a meal. 120 tablet 0   polyethylene glycol (MIRALAX / GLYCOLAX) 17 g packet Take 17 g  by mouth daily as needed for mild constipation or moderate constipation.     No current facility-administered medications on file prior to visit.   Allergies  Allergen Reactions   Codeine Other (See Comments)    Unknown reaction, severe headach   Metformin And Related Diarrhea     Review of Systems  All other systems reviewed and are negative.      Objective:   Physical Exam Vitals reviewed.  Constitutional:      Appearance: Normal appearance. He is normal weight.  Cardiovascular:     Rate and Rhythm: Normal rate. Rhythm irregular.     Heart sounds: Normal heart sounds. No murmur heard.    No friction rub. No gallop.  Pulmonary:     Effort: Pulmonary effort is normal.     Breath sounds: Normal breath sounds. No wheezing, rhonchi or rales.  Abdominal:     General: Abdomen is flat. Bowel sounds are normal.     Palpations: Abdomen is soft.  Musculoskeletal:     Right lower leg: No edema.     Left lower leg: No edema.  Neurological:     Mental Status: He is alert and oriented to person, place, and time. Mental status is at baseline.     Motor: Weakness present.    Assessment & Plan:  Atrial fibrillation, unspecified type (HCC) - Plan: CBC with Differential/Platelet, COMPLETE METABOLIC PANEL WITH GFR  Upper GI bleed - Plan: CBC with Differential/Platelet, COMPLETE METABOLIC PANEL WITH GFR  Anemia, unspecified type - Plan: CBC with Differential/Platelet, COMPLETE METABOLIC PANEL WITH GFR  Stage 3b chronic  kidney disease (HCC) - Plan: CBC with Differential/Platelet, COMPLETE METABOLIC PANEL WITH GFR  Type 2 diabetes mellitus with other specified complication, without long-term current use of insulin (HCC)  Seizure disorder (HCC) First, resume Toprol 50 mg daily to help regulate heart rate.  Second, continue Cardizem 180 mg daily to control heart rate.  Continue Eliquis.  Check CBC to reevaluate anemia.  Likely would benefit from being on ferrous sulfate 325 mg daily.  Discontinue losartan and hydralazine at the present time due to possible hypotension.  Continue Keppra 500 mg twice daily.  Start pantoprazole 40 mg daily to help prevent recurrent GI bleed.  Check CMP to monitor renal function

## 2023-04-22 NOTE — Telephone Encounter (Signed)
 [name] with [facility] called in asking to speak with nurse. She states she is concerned that pt hr has been in the 120's (161,096). She stated "I'm assuming afib because its irregular". She also stated pt has not been taking Diltiazem given during hospital stay. She states he does have 120mg  of that med at home. Please advise.

## 2023-04-22 NOTE — Telephone Encounter (Signed)
 Copied from CRM [id number]. Topic: Clinical - Medication Question >> Apr 22, 2023  9:49 AM [place] H wrote: Reason for CRM: Called to advised that there are multiple medication issues//Heart reate was upper 120s//Patient needs protonix 40mg  per day, magnesium not in home,  Furosemide not on list//Please ca [name] back at [phone]

## 2023-04-23 ENCOUNTER — Encounter: Payer: Self-pay | Admitting: Family Medicine

## 2023-04-23 DIAGNOSIS — I442 Atrioventricular block, complete: Secondary | ICD-10-CM | POA: Diagnosis not present

## 2023-04-23 DIAGNOSIS — I5042 Chronic combined systolic (congestive) and diastolic (congestive) heart failure: Secondary | ICD-10-CM | POA: Diagnosis not present

## 2023-04-23 DIAGNOSIS — N1832 Chronic kidney disease, stage 3b: Secondary | ICD-10-CM | POA: Diagnosis not present

## 2023-04-23 DIAGNOSIS — I69354 Hemiplegia and hemiparesis following cerebral infarction affecting left non-dominant side: Secondary | ICD-10-CM | POA: Diagnosis not present

## 2023-04-23 DIAGNOSIS — I480 Paroxysmal atrial fibrillation: Secondary | ICD-10-CM | POA: Diagnosis not present

## 2023-04-23 DIAGNOSIS — G40909 Epilepsy, unspecified, not intractable, without status epilepticus: Secondary | ICD-10-CM | POA: Diagnosis not present

## 2023-04-23 DIAGNOSIS — E1122 Type 2 diabetes mellitus with diabetic chronic kidney disease: Secondary | ICD-10-CM | POA: Diagnosis not present

## 2023-04-23 DIAGNOSIS — I130 Hypertensive heart and chronic kidney disease with heart failure and stage 1 through stage 4 chronic kidney disease, or unspecified chronic kidney disease: Secondary | ICD-10-CM | POA: Diagnosis not present

## 2023-04-23 DIAGNOSIS — M791 Myalgia, unspecified site: Secondary | ICD-10-CM | POA: Diagnosis not present

## 2023-04-23 LAB — COMPLETE METABOLIC PANEL WITH GFR
AG Ratio: 1.6 (calc) (ref 1.0–2.5)
ALT: 12 U/L (ref 9–46)
AST: 19 U/L (ref 10–35)
Albumin: 4.3 g/dL (ref 3.6–5.1)
Alkaline phosphatase (APISO): 74 U/L (ref 35–144)
BUN/Creatinine Ratio: 19 (calc) (ref 6–22)
BUN: 37 mg/dL — ABNORMAL HIGH (ref 7–25)
CO2: 23 mmol/L (ref 20–32)
Calcium: 9.6 mg/dL (ref 8.6–10.3)
Chloride: 108 mmol/L (ref 98–110)
Creat: 1.9 mg/dL — ABNORMAL HIGH (ref 0.70–1.35)
Globulin: 2.7 g/dL (ref 1.9–3.7)
Glucose, Bld: 131 mg/dL — ABNORMAL HIGH (ref 65–99)
Potassium: 5.4 mmol/L — ABNORMAL HIGH (ref 3.5–5.3)
Sodium: 142 mmol/L (ref 135–146)
Total Bilirubin: 0.5 mg/dL (ref 0.2–1.2)
Total Protein: 7 g/dL (ref 6.1–8.1)
eGFR: 38 mL/min/{1.73_m2} — ABNORMAL LOW (ref >=60)

## 2023-04-23 LAB — CBC WITH DIFFERENTIAL/PLATELET
Absolute Lymphocytes: 361 {cells}/uL — ABNORMAL LOW (ref 850–3900)
Absolute Monocytes: 488 {cells}/uL (ref 200–950)
Basophils Absolute: 31 {cells}/uL (ref 0–200)
Basophils Relative: 0.7 %
Eosinophils Absolute: 40 {cells}/uL (ref 15–500)
Eosinophils Relative: 0.9 %
HCT: 37.7 % — ABNORMAL LOW (ref 38.5–50.0)
Hemoglobin: 11.9 g/dL — ABNORMAL LOW (ref 13.2–17.1)
MCH: 29.7 pg (ref 27.0–33.0)
MCHC: 31.6 g/dL — ABNORMAL LOW (ref 32.0–36.0)
MCV: 94 fL (ref 80.0–100.0)
MPV: 9.2 fL (ref 7.5–12.5)
Monocytes Relative: 11.1 %
Neutro Abs: 3480 {cells}/uL (ref 1500–7800)
Neutrophils Relative %: 79.1 %
Platelets: 201 10*3/uL (ref 140–400)
RBC: 4.01 10*6/uL — ABNORMAL LOW (ref 4.20–5.80)
RDW: 14.5 % (ref 11.0–15.0)
Total Lymphocyte: 8.2 %
WBC: 4.4 10*3/uL (ref 3.8–10.8)

## 2023-04-29 ENCOUNTER — Ambulatory Visit: Payer: Self-pay | Admitting: Family Medicine

## 2023-04-29 NOTE — Telephone Encounter (Signed)
 Copied from CRM [id number]. Topic: Clinical - Red Word Triage >> Apr 29, 2023  2:45 PM Marlow Baars wrote: Red Word that prompted transfer to Nurse Triage: The patient called in stating even with taking diltiazem (CARDIZEM CD) 180 MG 24 hr capsule and metoprolol succinate (TOPROL-XL) 50 MG 24 hr tablet his heart rate is still high. I will transfer him to [facility]   Chief Complaint: Tachycardia  Symptoms: Heart rate in the 110's Frequency: Intermittent but more constant than not Pertinent Negatives: Patient denies chest pain, shortness of breath, dizziness  Disposition: [] ED /[] Urgent Care (no appt availability in office) / [x] Appointment(In office/virtual)/ []  [facility]/ [] Home Care/ [] Refused Recommended Disposition /[] [facility]/ []  Follow-up with PCP Additional Notes: Patient reports he has been experiencing intermittent tachycardia for 1-2 weeks. He states he has been taking his prescribed medication but his heart rate is still mostly in the 110's, but occasionally goes down to the 80's. He denies any symptoms with his fast heart rate. Appointment made for the patient on 05/10/23 and added to wait list. Patient instructed to call back for new or worsening symptoms. Patient verbalized understanding and agreement with this plan.     Reason for Disposition  Palpitations are a chronic symptom (recurrent or ongoing AND present > 4 weeks)  Answer Assessment - Initial Assessment Questions 1. DESCRIPTION: "Please describe your heart rate or heartbeat that you are having" (e.g., fast/slow, regular/irregular, skipped or extra beats, "palpitations")     Tachycardic 2. ONSET: "When did it start?" (Minutes, hours or days)      1-2 weeks 3. DURATION: "How long does it last" (e.g., seconds, minutes, hours)     Hours 4. PATTERN "Does it come and go, or has it been constant since it started?"  "Does it get worse with exertion?"   "Are you feeling it now?"     Intermittent, mostly constant   6. HEART RATE: "Can you tell me your heart rate?" "How many beats in 15 seconds?"  (Note: not all patients can do this)       117, as high as 125 this morning  7. RECURRENT SYMPTOM: "Have you ever had this before?" If Yes, ask: "When was the last time?" and "What happened that time?"      Yes 8. CAUSE: "What do you think is causing the palpitations?"     Unsure  9. CARDIAC HISTORY: "Do you have any history of heart disease?" (e.g., heart attack, angina, bypass surgery, angioplasty, arrhythmia)      Atrial fibrillation  10. OTHER SYMPTOMS: "Do you have any other symptoms?" (e.g., dizziness, chest pain, sweating, difficulty breathing)       No symptoms  Protocols used: Heart Rate and Heartbeat Questions-A-AH

## 2023-04-30 DIAGNOSIS — I480 Paroxysmal atrial fibrillation: Secondary | ICD-10-CM | POA: Diagnosis not present

## 2023-04-30 DIAGNOSIS — E1122 Type 2 diabetes mellitus with diabetic chronic kidney disease: Secondary | ICD-10-CM | POA: Diagnosis not present

## 2023-04-30 DIAGNOSIS — G40909 Epilepsy, unspecified, not intractable, without status epilepticus: Secondary | ICD-10-CM | POA: Diagnosis not present

## 2023-04-30 DIAGNOSIS — M791 Myalgia, unspecified site: Secondary | ICD-10-CM | POA: Diagnosis not present

## 2023-04-30 DIAGNOSIS — I5042 Chronic combined systolic (congestive) and diastolic (congestive) heart failure: Secondary | ICD-10-CM | POA: Diagnosis not present

## 2023-04-30 DIAGNOSIS — I130 Hypertensive heart and chronic kidney disease with heart failure and stage 1 through stage 4 chronic kidney disease, or unspecified chronic kidney disease: Secondary | ICD-10-CM | POA: Diagnosis not present

## 2023-04-30 DIAGNOSIS — I69354 Hemiplegia and hemiparesis following cerebral infarction affecting left non-dominant side: Secondary | ICD-10-CM | POA: Diagnosis not present

## 2023-04-30 DIAGNOSIS — N1832 Chronic kidney disease, stage 3b: Secondary | ICD-10-CM | POA: Diagnosis not present

## 2023-04-30 DIAGNOSIS — I442 Atrioventricular block, complete: Secondary | ICD-10-CM | POA: Diagnosis not present

## 2023-05-01 NOTE — Progress Notes (Deleted)
 Cardiology Office Note    Date:  05/01/2023  ID:  Brian [name], DOB Nov 12, 1955, MRN [id number] PCP:  [name], MD  Cardiologist:  Rollene Rotunda, MD  Electrophysiologist:  None   Chief Complaint: ***  History of Present Illness: .    Brian [name] is a 68 y.o. male with visit-pertinent history of persistent A-fib, hypertension, cardiomyopathy, prior stroke and history of conduction disease.  Patient has known history of cardiomyopathy with a EF of 25 to 30% in 2011.  At that time he had had a stroke and was treated tPA.  He also has a history of A-fib with RVR.  EF improved to 55 to 60% on echocardiogram by September 2023.  In November 2020 patient was admitted after a fall and seizure.  Echo at the time showed EF 45 to 50% with global hypokinesis, moderate LVH, grade 2 DD, ascending aorta 4.0 cm.  Patient had trivial pericardial effusion without evidence of tamponade.  His heart rate was elevated at time.  On 02/26/2023 patient presented the hospital with lethargy and altered mental status.  He is found to have atrial flutter with 41 conduction and underlying heart block.  This was new compared to previous longstanding persistent atrial fibrillation with bifascicular block.  Patient also had acute kidney injury with creatinine increased from baseline of 1.5-0.8.  Home diltiazem and metoprolol were held.  He was placed with temporary pacing wire by Dr. Herbie Baltimore on 02/26/2023.  Repeat echocardiogram on 02/27/2023 showed EF 60 to 65%, moderate LVH, mildly reduced RV, moderate LAE, no significant valve issues.  Cardizem was restarted.  Complete heart block eventually resolved and a temporary pacing wire was removed.  Patient was sent out with a 3-day heart monitor to follow-up on his heart rate, unfortunately results of this are not available.  Low-dose beta-blocker was added for rate control.  Jardiance was stopped during hospitalization.  Patient was last seen in clinic on 03/22/2023 for  follow-up.  He denied any further weakness.  He reported that he been doing well since hospitalization, was in [facility].  At visit heart rate was in the 80s, it was listed that patient was taking both metoprolol succinate 50 mg daily and metoprolol tartrate 25 mg twice a day.  Patient's metoprolol tartrate was discontinued, it was recommended that he follow-up in 2 to 4 weeks to monitor heart rate.  Today patient presents for an acute visit  Complete heart: Patient admitted in 02/2023 and complete heart block, resolved after placement of a temporary pacing wire and weaning off of some rate control medications.  Patient was discharged on Cardizem 180 mg daily and metoprolol succinate 50 mg daily. Current medications listed as Longstanding persistent atrial fibrillation: Patient admitted with new onset atrial flutter with 41 conduction and underlying heart block during recent hospitalization. EKG today indicates Dilated cardiomyopathy: Patient EF improved from 25 to 30% in 2011 to 60 to 65% in 2025.  Last echocardiogram showed no significant valvular issues.  Continue current medication regimen? Hypertension: Blood pressure today  Labwork independently reviewed:   ROS: .   *** denies chest pain, shortness of breath, lower extremity edema, fatigue, palpitations, melena, hematuria, hemoptysis, diaphoresis, weakness, presyncope, syncope, orthopnea, and PND.  All other systems are reviewed and otherwise negative.  Studies Reviewed: Marland Kitchen    EKG:  EKG is ordered today, personally reviewed, demonstrating ***     CV Studies: Cardiac studies reviewed are outlined and summarized above. Otherwise please see EMR for full  report. Cardiac Studies & Procedures   ______________________________________________________________________________________________ CARDIAC CATHETERIZATION  CARDIAC CATHETERIZATION 02/26/2023  Narrative   Anticipated discharge date to be determined.   No indication for antiplatelet  therapy at this time .  Successful Temporary Pacemaker Placement via RFV 6 Fr Sheath -> 62 cm, Rate 80 bpm, 5 mA (threshold 2 mA)  The patient will be transported to the CVICU.  Will use right knee immobilizer as well as bilateral wrist and right ankle restraints to avoid moving the pacemaker. Chest x-ray ordered to evaluate line positioning.   Brian Lemma, MD     ECHOCARDIOGRAM  ECHOCARDIOGRAM COMPLETE 02/27/2023  Narrative ECHOCARDIOGRAM REPORT    Patient Name:   Brian [name] Date of Exam: 02/27/2023 Medical Rec #:  [id number]         Height:       69.0 in Accession #:    [id number]        Weight:       167.5 lb Date of Birth:  19-Jul-1955          BSA:          1.916 m Patient Age:    67 years          BP:           134/82 mmHg Patient Gender: M                 HR:           90 bpm. Exam Location:  Inpatient  Procedure: 2D Echo, Cardiac Doppler, Color Doppler and Intracardiac Opacification Agent  Indications:    Heart Block, Complete  History:        Patient has prior history of Echocardiogram examinations, most recent 01/07/2023. Arrythmias:Atrial Fibrillation; Risk Factors:Hypertension, Diabetes and Dyslipidemia.  Sonographer:    Brian [name] Referring Phys: 37 Brian [name]   Sonographer Comments: Technically challenging study due to limited acoustic windows. IMPRESSIONS   1. Cannot fully assess wall motion. Left ventricular ejection fraction, by estimation, is 60 to 65%. The left ventricle has normal function. There is moderate concentric left ventricular hypertrophy. Left ventricular diastolic parameters are indeterminate. 2. Right ventricular systolic function is mildly reduced. The right ventricular size is mildly enlarged. Tricuspid regurgitation signal is inadequate for assessing PA pressure. 3. Left atrial size was moderately dilated. 4. Right atrial size was mildly dilated. 5. The mitral valve was not well visualized. No evidence of mitral valve  regurgitation. 6. The aortic valve was not well visualized. Aortic valve regurgitation is not visualized. 7. The inferior vena cava is dilated in size with >50% respiratory variability, suggesting right atrial pressure of 8 mmHg.  FINDINGS Left Ventricle: Cannot fully assess wall motion. Left ventricular ejection fraction, by estimation, is 60 to 65%. The left ventricle has normal function. Definity contrast agent was given IV to delineate the left ventricular endocardial borders. The left ventricular internal cavity size was normal in size. There is moderate concentric left ventricular hypertrophy. Left ventricular diastolic parameters are indeterminate.  Right Ventricle: The right ventricular size is mildly enlarged. Right ventricular systolic function is mildly reduced. Tricuspid regurgitation signal is inadequate for assessing PA pressure.  Left Atrium: Left atrial size was moderately dilated.  Right Atrium: Right atrial size was mildly dilated.  Pericardium: There is no evidence of pericardial effusion.  Mitral Valve: The mitral valve was not well visualized. No evidence of mitral valve regurgitation.  Tricuspid Valve: Tricuspid valve regurgitation is not demonstrated.  Aortic Valve: The aortic  valve was not well visualized. Aortic valve regurgitation is not visualized. Aortic valve mean gradient measures 1.7 mmHg. Aortic valve peak gradient measures 3.1 mmHg. Aortic valve area, by VTI measures 2.82 cm.  Pulmonic Valve: Pulmonic valve regurgitation is not visualized.  Aorta: The aortic root and ascending aorta are structurally normal, with no evidence of dilitation.  Venous: The inferior vena cava is dilated in size with greater than 50% respiratory variability, suggesting right atrial pressure of 8 mmHg.  IAS/Shunts: The interatrial septum was not well visualized.   LEFT VENTRICLE PLAX 2D LVIDd:         4.00 cm   Diastology LVIDs:         2.40 cm   LV e' medial:    4.93  cm/s LV PW:         1.30 cm   LV E/e' medial:  17.6 LV IVS:        1.30 cm   LV e' lateral:   7.29 cm/s LVOT diam:     2.00 cm   LV E/e' lateral: 11.9 LV SV:         43 LV SV Index:   22 LVOT Area:     3.14 cm   RIGHT VENTRICLE            IVC RV Basal diam:  4.30 cm    IVC diam: 2.60 cm RV S prime:     6.56 cm/s TAPSE (M-mode): 1.6 cm  LEFT ATRIUM              Index        RIGHT ATRIUM           Index LA diam:        4.30 cm  2.24 cm/m   RA Area:     24.10 cm LA Vol (A2C):   68.1 ml  35.54 ml/m  RA Volume:   76.00 ml  39.66 ml/m LA Vol (A4C):   105.0 ml 54.79 ml/m LA Biplane Vol: 86.1 ml  44.93 ml/m AORTIC VALVE AV Area (Vmax):    2.38 cm AV Area (Vmean):   2.40 cm AV Area (VTI):     2.82 cm AV Vmax:           88.00 cm/s AV Vmean:          60.667 cm/s AV VTI:            0.153 m AV Peak Grad:      3.1 mmHg AV Mean Grad:      1.7 mmHg LVOT Vmax:         66.63 cm/s LVOT Vmean:        46.433 cm/s LVOT VTI:          0.137 m LVOT/AV VTI ratio: 0.90  AORTA Ao Root diam: 3.40 cm Ao Asc diam:  3.30 cm  MITRAL VALVE MV Area (PHT): 3.92 cm    SHUNTS MV Decel Time: 194 msec    Systemic VTI:  0.14 m MV E velocity: 86.80 cm/s  Systemic Diam: 2.00 cm  Carolan Clines Electronically signed by Carolan Clines Signature Date/Time: 02/27/2023/12:35:35 PM    Final    MONITORS  CARDIAC EVENT MONITOR 03/16/2021       ______________________________________________________________________________________________       Current Reported Medications:.    No outpatient medications have been marked as taking for the 05/03/23 encounter (Appointment) with Rip Harbour, NP.    Physical Exam:    VS:  There were  no vitals taken for this visit.   Wt Readings from Last 3 Encounters:  04/22/23 168 lb 6.4 oz (76.4 kg)  04/18/23 173 lb (78.5 kg)  04/02/23 175 lb 9.6 oz (79.7 kg)    GEN: Well nourished, well developed in no acute distress NECK: No JVD; No carotid bruits CARDIAC:  ***RRR, no murmurs, rubs, gallops RESPIRATORY:  Clear to auscultation without rales, wheezing or rhonchi  ABDOMEN: Soft, non-tender, non-distended EXTREMITIES:  No edema; No acute deformity     Asessement and Plan:.     ***     Disposition: F/u with ***  Signed, Rip Harbour, NP

## 2023-05-03 ENCOUNTER — Ambulatory Visit: Payer: Medicare HMO | Admitting: Cardiology

## 2023-05-07 DIAGNOSIS — G40909 Epilepsy, unspecified, not intractable, without status epilepticus: Secondary | ICD-10-CM | POA: Diagnosis not present

## 2023-05-07 DIAGNOSIS — M791 Myalgia, unspecified site: Secondary | ICD-10-CM | POA: Diagnosis not present

## 2023-05-07 DIAGNOSIS — I442 Atrioventricular block, complete: Secondary | ICD-10-CM | POA: Diagnosis not present

## 2023-05-07 DIAGNOSIS — I5042 Chronic combined systolic (congestive) and diastolic (congestive) heart failure: Secondary | ICD-10-CM | POA: Diagnosis not present

## 2023-05-07 DIAGNOSIS — N1832 Chronic kidney disease, stage 3b: Secondary | ICD-10-CM | POA: Diagnosis not present

## 2023-05-07 DIAGNOSIS — E1122 Type 2 diabetes mellitus with diabetic chronic kidney disease: Secondary | ICD-10-CM | POA: Diagnosis not present

## 2023-05-07 DIAGNOSIS — I69354 Hemiplegia and hemiparesis following cerebral infarction affecting left non-dominant side: Secondary | ICD-10-CM | POA: Diagnosis not present

## 2023-05-07 DIAGNOSIS — I480 Paroxysmal atrial fibrillation: Secondary | ICD-10-CM | POA: Diagnosis not present

## 2023-05-07 DIAGNOSIS — I130 Hypertensive heart and chronic kidney disease with heart failure and stage 1 through stage 4 chronic kidney disease, or unspecified chronic kidney disease: Secondary | ICD-10-CM | POA: Diagnosis not present

## 2023-05-09 ENCOUNTER — Telehealth: Payer: Self-pay

## 2023-05-09 DIAGNOSIS — G40909 Epilepsy, unspecified, not intractable, without status epilepticus: Secondary | ICD-10-CM | POA: Diagnosis not present

## 2023-05-09 DIAGNOSIS — I480 Paroxysmal atrial fibrillation: Secondary | ICD-10-CM | POA: Diagnosis not present

## 2023-05-09 DIAGNOSIS — N1832 Chronic kidney disease, stage 3b: Secondary | ICD-10-CM | POA: Diagnosis not present

## 2023-05-09 DIAGNOSIS — M791 Myalgia, unspecified site: Secondary | ICD-10-CM | POA: Diagnosis not present

## 2023-05-09 DIAGNOSIS — I442 Atrioventricular block, complete: Secondary | ICD-10-CM | POA: Diagnosis not present

## 2023-05-09 DIAGNOSIS — I69354 Hemiplegia and hemiparesis following cerebral infarction affecting left non-dominant side: Secondary | ICD-10-CM | POA: Diagnosis not present

## 2023-05-09 DIAGNOSIS — I130 Hypertensive heart and chronic kidney disease with heart failure and stage 1 through stage 4 chronic kidney disease, or unspecified chronic kidney disease: Secondary | ICD-10-CM | POA: Diagnosis not present

## 2023-05-09 DIAGNOSIS — E1122 Type 2 diabetes mellitus with diabetic chronic kidney disease: Secondary | ICD-10-CM | POA: Diagnosis not present

## 2023-05-09 DIAGNOSIS — I5042 Chronic combined systolic (congestive) and diastolic (congestive) heart failure: Secondary | ICD-10-CM | POA: Diagnosis not present

## 2023-05-09 NOTE — Telephone Encounter (Signed)
 Copied from CRM [id number]. Topic: Clinical - Home Health Verbal Orders >> May 09, 2023 10:26 AM Hubert Azure wrote: Caller/Agency: [facility] Callback Number: [phone] Service Requested: social worker  Frequency: n/a  Any new concerns about the patient? Yes

## 2023-05-10 ENCOUNTER — Encounter: Payer: Self-pay | Admitting: Family Medicine

## 2023-05-10 ENCOUNTER — Ambulatory Visit: Admitting: Family Medicine

## 2023-05-10 VITALS — BP 160/102 | HR 55 | Temp 97.6°F | Ht 69.0 in | Wt 168.0 lb

## 2023-05-10 DIAGNOSIS — I4891 Unspecified atrial fibrillation: Secondary | ICD-10-CM | POA: Diagnosis not present

## 2023-05-10 NOTE — Progress Notes (Signed)
 Subjective:    Patient ID: Brian [name], male    DOB: September 25, 1955, 68 y.o.   MRN: [id number]  Patient is a 68 year old Caucasian gentleman with a history of stage IV metastatic squamous cell carcinoma.  Originally was diagnosed at the base of his tongue.  CT scan last year revealed recurrence in the lung.  Currently being treated through radiation oncology.  Was admitted to the hospital in January with upper GI bleed due to gastritis complicated by profound bradycardia.  Also has a history of seizure disorder, stage III chronic kidney disease, type 2 diabetes mellitus, atrial fibrillation, and congestive heart failure.  Patient is currently on Toprol-XL 50 mg daily as well as diltiazem XL 180 mg daily for atrial fibrillation.  He states that his heart rate is been erratic recently.  Heart rate has been ranging between 90 and greater than 130.  Today on exam, the patient's heart rate is erratic but in the upper 90s.  EKG today shows atrial fibrillation without rapid ventricular response.  There is no evidence of ischemia.  Patient denies any chest pain.  He denies any shortness of breath.  He denies any lightheadedness or syncope.  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 Ashley [name], laser therapy OD   Seizures [facility])    pt reports this is from low blood sugar   Past Surgical History:  Procedure Laterality Date   BIOPSY  03/01/2023   Procedure: BIOPSY;  Surgeon: Brian Form, MD;  Location: [facility] ENDOSCOPY;  Service: Gastroenterology;;   BRONCHIAL NEEDLE ASPIRATION BIOPSY  10/30/2022   Procedure: BRONCHIAL NEEDLE ASPIRATION BIOPSIES;   Surgeon: Brian Igo, DO;  Location: [facility] ENDOSCOPY;  Service: Cardiopulmonary;;   COLONOSCOPY     DIRECT LARYNGOSCOPY N/A 02/10/2020   Procedure: DIRECT LARYNGOSCOPY WITH BIOPSY;  Surgeon: Brian Pies, MD;  Location: [facility] OR;  Service: ENT;  Laterality: N/A;   Ear surgery as a child     ESOPHAGOGASTRODUODENOSCOPY N/A 03/01/2023   Procedure: ESOPHAGOGASTRODUODENOSCOPY (EGD);  Surgeon: Brian Form, MD;  Location: [facility] ENDOSCOPY;  Service: Gastroenterology;  Laterality: N/A;   IR GASTROSTOMY TUBE MOD SED  03/23/2020   IR GASTROSTOMY TUBE REMOVAL  06/01/2020   IR IMAGING GUIDED PORT INSERTION  03/09/2020   IR REMOVAL TUN ACCESS W/ PORT W/O FL MOD SED  02/08/2021   TEMPORARY PACEMAKER N/A 02/26/2023   Procedure: TEMPORARY PACEMAKER;  Surgeon: Brian Lex, MD;  Location: [facility] INVASIVE CV LAB;  Service: Cardiovascular;  Laterality: N/A;   THORACENTESIS  10/30/2022   Procedure: THORACENTESIS;  Surgeon: Brian Igo, DO;  Location: [facility] ENDOSCOPY;  Service: Cardiopulmonary;;  L pleural effusion   TONSILLECTOMY     VIDEO BRONCHOSCOPY Left 10/30/2022   Procedure: VIDEO BRONCHOSCOPY WITHOUT FLUORO;  Surgeon: Brian Igo, DO;  Location: [facility] ENDOSCOPY;  Service: Cardiopulmonary;  Laterality: Left;   VIDEO BRONCHOSCOPY WITH ENDOBRONCHIAL ULTRASOUND Bilateral 10/30/2022   Procedure: VIDEO BRONCHOSCOPY WITH ENDOBRONCHIAL ULTRASOUND;  Surgeon: Brian Igo, DO;  Location: [facility] ENDOSCOPY;  Service: Cardiopulmonary;  Laterality: Bilateral;  Social History   Socioeconomic History   Marital status: Divorced    Spouse name: Not on file   Number of children: 2   Years of education: 12   Highest education level: 12th grade  Occupational History   Occupation: Disabled  Tobacco Use   Smoking status: Never    Passive exposure: Never   Smokeless tobacco: Never   Tobacco comments:    Verified by son, Brian [name]  Vaping Use   Vaping status: Never Used  Substance and Sexual Activity   Alcohol  use: Yes    Alcohol/week: 0.0 standard drinks of alcohol    Comment: seldom   Drug use: Not Currently    Frequency: 7.0 times per week    Types: Marijuana    Comment: marijauna for arthritis, last 03-13-20   Sexual activity: Not Currently    Partners: Female  Other Topics Concern   Not on file  Social History Narrative   Two children.  5 grandchildren.     Social Drivers of Corporate investment banker Strain: Low Risk  (04/05/2022)   Overall Financial Resource Strain (CARDIA)    Difficulty of Paying Living Expenses: Not hard at all  Food Insecurity: No Food Insecurity (03/01/2023)   Hunger Vital Sign    Worried About Running Out of Food in the Last Year: Never true    Ran Out of Food in the Last Year: Never true  Transportation Needs: Unmet Transportation Needs (03/01/2023)   PRAPARE - Administrator, Civil Service (Medical): Yes    Lack of Transportation (Non-Medical): Yes  Physical Activity: Inactive (04/05/2022)   Exercise Vital Sign    Days of Exercise per Week: 0 days    Minutes of Exercise per Session: 0 min  Stress: No Stress Concern Present (04/05/2022)   Brian [name] of Occupational Health - Occupational Stress Questionnaire    Feeling of Stress : Not at all  Social Connections: Socially Isolated (03/01/2023)   Social Connection and Isolation Panel [NHANES]    Frequency of Communication with Friends and Family: More than three times a week    Frequency of Social Gatherings with Friends and Family: Patient unable to answer    Attends Religious Services: Never    Database administrator or Organizations: No    Attends Banker Meetings: Never    Marital Status: Divorced  Catering manager Violence: Not At Risk (03/01/2023)   Humiliation, Afraid, Rape, and Kick questionnaire    Fear of Current or Ex-Partner: No    Emotionally Abused: No    Physically Abused: No    Sexually Abused: No   Current Outpatient Medications on File Prior to Visit   Medication Sig Dispense Refill   apixaban (ELIQUIS) 5 MG TABS tablet Take 1 tablet (5 mg total) by mouth 2 (two) times daily. 60 tablet 5   atorvastatin (LIPITOR) 20 MG tablet Take 1 tablet (20 mg total) by mouth daily. (Patient taking differently: Take 20 mg by mouth at bedtime.) 90 tablet 3   diltiazem (CARDIZEM CD) 180 MG 24 hr capsule Take 1 capsule (180 mg total) by mouth daily. 90 capsule 3   levETIRAcetam (KEPPRA) 500 MG tablet Take 1 tablet (500 mg total) by mouth 2 (two) times daily. 180 tablet 3   metoprolol succinate (TOPROL-XL) 50 MG 24 hr tablet Take 1 tablet (50 mg total) by mouth daily. Take with or immediately following a meal. 90 tablet 3   pantoprazole (PROTONIX) 40 MG tablet Take  1 tablet (40 mg total) by mouth daily. 90 tablet 3   No current facility-administered medications on file prior to visit.   Allergies  Allergen Reactions   Codeine Other (See Comments)    Unknown reaction, severe headach   Metformin And Related Diarrhea     Review of Systems  All other systems reviewed and are negative.      Objective:   Physical Exam Vitals reviewed.  Constitutional:      Appearance: Normal appearance. He is normal weight.  Cardiovascular:     Rate and Rhythm: Normal rate. Rhythm irregular.     Heart sounds: Normal heart sounds. No murmur heard.    No friction rub. No gallop.  Pulmonary:     Effort: Pulmonary effort is normal.     Breath sounds: Normal breath sounds. No wheezing, rhonchi or rales.  Abdominal:     General: Abdomen is flat. Bowel sounds are normal.     Palpations: Abdomen is soft.  Musculoskeletal:     Right lower leg: No edema.     Left lower leg: No edema.  Neurological:     Mental Status: He is alert and oriented to person, place, and time. Mental status is at baseline.     Motor: Weakness present.    Assessment & Plan:  Atrial fibrillation, unspecified type (HCC) Increase Toprol-XL to 100 mg daily.  Patient will take 2, 50 mg pills over  the weekend and then will come by next week to let us recheck his heart rate.

## 2023-05-12 NOTE — Progress Notes (Deleted)
 Cardiology Office Note    Date:  05/12/2023  ID:  Brian [name], DOB 12-03-1955, MRN [id number] PCP:  [name], MD  Cardiologist:  Rollene Rotunda, MD  Electrophysiologist:  None   Chief Complaint: ***  History of Present Illness: .    Brian [name] is a 68 y.o. male with visit-pertinent history of persistent A-fib, hypertension, cardiomyopathy, prior stroke and history of conduction disease.  Patient has known history of cardiomyopathy with a EF of 25 to 30% in 2011.  At that time he had had a stroke and was treated tPA.  He also has a history of A-fib with RVR.  EF improved to 55 to 60% on echocardiogram by September 2023.  In November 2020 patient was admitted after a fall and seizure.  Echo at the time showed EF 45 to 50% with global hypokinesis, moderate LVH, grade 2 DD, ascending aorta 4.0 cm.  Patient had trivial pericardial effusion without evidence of tamponade.  His heart rate was elevated at time.  On 02/26/2023 patient presented the hospital with lethargy and altered mental status.  He is found to have atrial flutter with 41 conduction and underlying heart block.  This was new compared to previous longstanding persistent atrial fibrillation with bifascicular block.  Patient also had acute kidney injury with creatinine increased from baseline of 1.5-0.8.  Home diltiazem and metoprolol were held.  He was placed with temporary pacing wire by Dr. Herbie Baltimore on 02/26/2023.  Repeat echocardiogram on 02/27/2023 showed EF 60 to 65%, moderate LVH, mildly reduced RV, moderate LAE, no significant valve issues.  Cardizem was restarted.  Complete heart block eventually resolved and a temporary pacing wire was removed.  Patient was sent out with a 3-day heart monitor to follow-up on his heart rate, unfortunately results of this are not available.  Low-dose beta-blocker was added for rate control.  Jardiance was stopped during hospitalization.  Patient was last seen in clinic on 03/22/2023 for  follow-up.  He denied any further weakness.  He reported that he been doing well since hospitalization, was in [place] re[facility].  At visit heart rate was in the 80s, it was listed that patient was taking both metoprolol succinate 50 mg daily and metoprolol tartrate 25 mg twice a day.  Patient's metoprolol tartrate was discontinued, it was recommended that he follow-up in 2 to 4 weeks to monitor heart rate.  Today patient presents for an acute visit  Complete heart: Patient admitted in 02/2023 and complete heart block, resolved after placement of a temporary pacing wire and weaning off of some rate control medications.  Patient was discharged on Cardizem 180 mg daily and metoprolol succinate 50 mg daily. Current medications listed as Longstanding persistent atrial fibrillation: Patient admitted with new onset atrial flutter with 41 conduction and underlying heart block during recent hospitalization. EKG today indicates Dilated cardiomyopathy: Patient EF improved from 25 to 30% in 2011 to 60 to 65% in 2025.  Last echocardiogram showed no significant valvular issues.  Continue current medication regimen? Hypertension: Blood pressure today  Labwork independently reviewed:   ROS: .   *** denies chest pain, shortness of breath, lower extremity edema, fatigue, palpitations, melena, hematuria, hemoptysis, diaphoresis, weakness, presyncope, syncope, orthopnea, and PND.  All other systems are reviewed and otherwise negative.  Studies Reviewed: Marland Kitchen    EKG:  EKG is ordered today, personally reviewed, demonstrating ***     CV Studies: Cardiac studies reviewed are outlined and summarized above. Otherwise please see EMR for full  report. Cardiac Studies & Procedures   ______________________________________________________________________________________________ CARDIAC CATHETERIZATION  CARDIAC CATHETERIZATION 02/26/2023  Narrative   Anticipated discharge date to be determined.   No indication for antiplatelet  therapy at this time .  Successful Temporary Pacemaker Placement via RFV 6 Fr Sheath -> 62 cm, Rate 80 bpm, 5 mA (threshold 2 mA)  The patient will be transported to the CVICU.  Will use right knee immobilizer as well as bilateral wrist and right ankle restraints to avoid moving the pacemaker. Chest x-ray ordered to evaluate line positioning.   Brian Lemma, MD     ECHOCARDIOGRAM  ECHOCARDIOGRAM COMPLETE 02/27/2023  Narrative ECHOCARDIOGRAM REPORT    Patient Name:   Brian [name] Date of Exam: 02/27/2023 Medical Rec #:  [id number]         Height:       69.0 in Accession #:    [id number]        Weight:       167.5 lb Date of Birth:  May 28, 1955          BSA:          1.916 m Patient Age:    67 years          BP:           134/82 mmHg Patient Gender: M                 HR:           90 bpm. Exam Location:  Inpatient  Procedure: 2D Echo, Cardiac Doppler, Color Doppler and Intracardiac Opacification Agent  Indications:    Heart Block, Complete  History:        Patient has prior history of Echocardiogram examinations, most recent 01/07/2023. Arrythmias:Atrial Fibrillation; Risk Factors:Hypertension, Diabetes and Dyslipidemia.  Sonographer:    Karma Ganja Referring Phys: [id number] Brian [name]   Sonographer Comments: Technically challenging study due to limited acoustic windows. IMPRESSIONS   1. Cannot fully assess wall motion. Left ventricular ejection fraction, by estimation, is 60 to 65%. The left ventricle has normal function. There is moderate concentric left ventricular hypertrophy. Left ventricular diastolic parameters are indeterminate. 2. Right ventricular systolic function is mildly reduced. The right ventricular size is mildly enlarged. Tricuspid regurgitation signal is inadequate for assessing PA pressure. 3. Left atrial size was moderately dilated. 4. Right atrial size was mildly dilated. 5. The mitral valve was not well visualized. No evidence of mitral valve  regurgitation. 6. The aortic valve was not well visualized. Aortic valve regurgitation is not visualized. 7. The inferior vena cava is dilated in size with >50% respiratory variability, suggesting right atrial pressure of 8 mmHg.  FINDINGS Left Ventricle: Cannot fully assess wall motion. Left ventricular ejection fraction, by estimation, is 60 to 65%. The left ventricle has normal function. Definity contrast agent was given IV to delineate the left ventricular endocardial borders. The left ventricular internal cavity size was normal in size. There is moderate concentric left ventricular hypertrophy. Left ventricular diastolic parameters are indeterminate.  Right Ventricle: The right ventricular size is mildly enlarged. Right ventricular systolic function is mildly reduced. Tricuspid regurgitation signal is inadequate for assessing PA pressure.  Left Atrium: Left atrial size was moderately dilated.  Right Atrium: Right atrial size was mildly dilated.  Pericardium: There is no evidence of pericardial effusion.  Mitral Valve: The mitral valve was not well visualized. No evidence of mitral valve regurgitation.  Tricuspid Valve: Tricuspid valve regurgitation is not demonstrated.  Aortic Valve: The aortic  valve was not well visualized. Aortic valve regurgitation is not visualized. Aortic valve mean gradient measures 1.7 mmHg. Aortic valve peak gradient measures 3.1 mmHg. Aortic valve area, by VTI measures 2.82 cm.  Pulmonic Valve: Pulmonic valve regurgitation is not visualized.  Aorta: The aortic root and ascending aorta are structurally normal, with no evidence of dilitation.  Venous: The inferior vena cava is dilated in size with greater than 50% respiratory variability, suggesting right atrial pressure of 8 mmHg.  IAS/Shunts: The interatrial septum was not well visualized.   LEFT VENTRICLE PLAX 2D LVIDd:         4.00 cm   Diastology LVIDs:         2.40 cm   LV e' medial:    4.93  cm/s LV PW:         1.30 cm   LV E/e' medial:  17.6 LV IVS:        1.30 cm   LV e' lateral:   7.29 cm/s LVOT diam:     2.00 cm   LV E/e' lateral: 11.9 LV SV:         43 LV SV Index:   22 LVOT Area:     3.14 cm   RIGHT VENTRICLE            IVC RV Basal diam:  4.30 cm    IVC diam: 2.60 cm RV S prime:     6.56 cm/s TAPSE (M-mode): 1.6 cm  LEFT ATRIUM              Index        RIGHT ATRIUM           Index LA diam:        4.30 cm  2.24 cm/m   RA Area:     24.10 cm LA Vol (A2C):   68.1 ml  35.54 ml/m  RA Volume:   76.00 ml  39.66 ml/m LA Vol (A4C):   105.0 ml 54.79 ml/m LA Biplane Vol: 86.1 ml  44.93 ml/m AORTIC VALVE AV Area (Vmax):    2.38 cm AV Area (Vmean):   2.40 cm AV Area (VTI):     2.82 cm AV Vmax:           88.00 cm/s AV Vmean:          60.667 cm/s AV VTI:            0.153 m AV Peak Grad:      3.1 mmHg AV Mean Grad:      1.7 mmHg LVOT Vmax:         66.63 cm/s LVOT Vmean:        46.433 cm/s LVOT VTI:          0.137 m LVOT/AV VTI ratio: 0.90  AORTA Ao Root diam: 3.40 cm Ao Asc diam:  3.30 cm  MITRAL VALVE MV Area (PHT): 3.92 cm    SHUNTS MV Decel Time: 194 msec    Systemic VTI:  0.14 m MV E velocity: 86.80 cm/s  Systemic Diam: 2.00 cm  Carolan Clines Electronically signed by Carolan Clines Signature Date/Time: 02/27/2023/12:35:35 PM    Final    MONITORS  CARDIAC EVENT MONITOR 03/16/2021       ______________________________________________________________________________________________       Current Reported Medications:.    No outpatient medications have been marked as taking for the 05/15/23 encounter (Appointment) with Rip Harbour, NP.    Physical Exam:    VS:  There were  no vitals taken for this visit.   Wt Readings from Last 3 Encounters:  05/10/23 168 lb (76.2 kg)  04/22/23 168 lb 6.4 oz (76.4 kg)  04/18/23 173 lb (78.5 kg)    GEN: Well nourished, well developed in no acute distress NECK: No JVD; No carotid bruits CARDIAC: ***RRR,  no murmurs, rubs, gallops RESPIRATORY:  Clear to auscultation without rales, wheezing or rhonchi  ABDOMEN: Soft, non-tender, non-distended EXTREMITIES:  No edema; No acute deformity     Asessement and Plan:.     ***     Disposition: F/u with ***  Signed, Rip Harbour, NP

## 2023-05-13 ENCOUNTER — Ambulatory Visit (INDEPENDENT_AMBULATORY_CARE_PROVIDER_SITE_OTHER): Admitting: Family Medicine

## 2023-05-13 ENCOUNTER — Encounter: Payer: Self-pay | Admitting: Family Medicine

## 2023-05-13 VITALS — BP 130/62 | HR 88 | Ht 69.0 in | Wt 170.0 lb

## 2023-05-13 DIAGNOSIS — I4891 Unspecified atrial fibrillation: Secondary | ICD-10-CM | POA: Diagnosis not present

## 2023-05-13 MED ORDER — METOPROLOL SUCCINATE ER 100 MG PO TB24: 100.0000 mg | ORAL_TABLET | Freq: Every day | ORAL | 3 refills | Status: DC

## 2023-05-13 NOTE — Progress Notes (Signed)
 Subjective:    Patient ID: Brian [name], male    DOB: [birth year], 68 y.o.   MRN: [id number]  05/10/23 Patient is a 68 year old Caucasian gentleman with a history of stage IV metastatic squamous cell carcinoma.  Originally was diagnosed at the base of his tongue.  CT scan last year revealed recurrence in the lung.  Currently being treated through radiation oncology.  Was admitted to the hospital in January with upper GI bleed due to gastritis complicated by profound bradycardia.  Also has a history of seizure disorder, stage III chronic kidney disease, type 2 diabetes mellitus, atrial fibrillation, and congestive heart failure.  Patient is currently on Toprol-XL 50 mg daily as well as diltiazem XL 180 mg daily for atrial fibrillation.  He states that his heart rate is been erratic recently.  Heart rate has been ranging between 90 and greater than 130.  Today on exam, the patient's heart rate is erratic but in the upper 90s.  EKG today shows atrial fibrillation without rapid ventricular response.  There is no evidence of ischemia.  Patient denies any chest pain.  He denies any shortness of breath.  He denies any lightheadedness or syncope.  At that time, my plan was: Increase Toprol-XL to 100 mg daily.  Patient will take 2, 50 mg pills over the weekend and then will come by next week to let us recheck his heart rate.  05/13/23  Over the weekend, patient's heart rate has been 57-60.  He denies any heart rate greater than 100.  He denies any lightheadedness.  He denies any shortness of breath or chest pain.  Here today, his heart rate is in the mid to upper 80s.  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 [name], laser therapy OD   Seizures [facility])    pt reports this is from low blood sugar   Past Surgical History:  Procedure Laterality Date   BIOPSY  03/01/2023   Procedure: BIOPSY;  Surgeon: Napoleon Form, MD;  Location: [facility] ENDOSCOPY;  Service: Gastroenterology;;   BRONCHIAL NEEDLE ASPIRATION BIOPSY  10/30/2022   Procedure: BRONCHIAL NEEDLE ASPIRATION BIOPSIES;  Surgeon: Josephine Igo, DO;  Location: [facility] ENDOSCOPY;  Service: Cardiopulmonary;;   COLONOSCOPY     DIRECT LARYNGOSCOPY N/A 02/10/2020   Procedure: DIRECT LARYNGOSCOPY WITH BIOPSY;  Surgeon: Newman Pies, MD;  Location: [facility] OR;  Service: ENT;  Laterality: N/A;   Ear surgery as a child     ESOPHAGOGASTRODUODENOSCOPY N/A 03/01/2023   Procedure: ESOPHAGOGASTRODUODENOSCOPY (EGD);  Surgeon: Napoleon Form, MD;  Location: [facility] ENDOSCOPY;  Service: Gastroenterology;  Laterality: N/A;   IR GASTROSTOMY TUBE MOD SED  03/23/2020   IR GASTROSTOMY TUBE REMOVAL  06/01/2020   IR IMAGING GUIDED PORT INSERTION  03/09/2020   IR REMOVAL TUN ACCESS W/ PORT W/O FL MOD SED  02/08/2021   TEMPORARY PACEMAKER N/A 02/26/2023   Procedure: TEMPORARY PACEMAKER;  Surgeon: Marykay Lex, MD;  Location: [facility] INVASIVE CV LAB;  Service: Cardiovascular;  Laterality: N/A;   THORACENTESIS  10/30/2022  Procedure: THORACENTESIS;  Surgeon: Josephine Igo, DO;  Location: [facility] ENDOSCOPY;  Service: Cardiopulmonary;;  L pleural effusion   TONSILLECTOMY     VIDEO BRONCHOSCOPY Left 10/30/2022   Procedure: VIDEO BRONCHOSCOPY WITHOUT FLUORO;  Surgeon: Josephine Igo, DO;  Location: [facility] ENDOSCOPY;  Service: Cardiopulmonary;  Laterality: Left;   VIDEO BRONCHOSCOPY WITH ENDOBRONCHIAL ULTRASOUND Bilateral 10/30/2022   Procedure: VIDEO BRONCHOSCOPY WITH ENDOBRONCHIAL ULTRASOUND;  Surgeon: Josephine Igo, DO;  Location: [facility] ENDOSCOPY;  Service: Cardiopulmonary;  Laterality: Bilateral;   Social History    Socioeconomic History   Marital status: Divorced    Spouse name: Not on file   Number of children: 2   Years of education: 12   Highest education level: 12th grade  Occupational History   Occupation: Disabled  Tobacco Use   Smoking status: Never    Passive exposure: Never   Smokeless tobacco: Never   Tobacco comments:    Verified by son, Rael Yo  Vaping Use   Vaping status: Never Used  Substance and Sexual Activity   Alcohol use: Yes    Alcohol/week: 0.0 standard drinks of alcohol    Comment: seldom   Drug use: Not Currently    Frequency: 7.0 times per week    Types: Marijuana    Comment: marijauna for arthritis, last 03-13-20   Sexual activity: Not Currently    Partners: Female  Other Topics Concern   Not on file  Social History Narrative   Two children.  5 grandchildren.     Social Drivers of Corporate investment banker Strain: Low Risk  (04/05/2022)   Overall Financial Resource Strain (CARDIA)    Difficulty of Paying Living Expenses: Not hard at all  Food Insecurity: No Food Insecurity (03/01/2023)   Hunger Vital Sign    Worried About Running Out of Food in the Last Year: Never true    Ran Out of Food in the Last Year: Never true  Transportation Needs: Unmet Transportation Needs (03/01/2023)   PRAPARE - Administrator, Civil Service (Medical): Yes    Lack of Transportation (Non-Medical): Yes  Physical Activity: Inactive (04/05/2022)   Exercise Vital Sign    Days of Exercise per Week: 0 days    Minutes of Exercise per Session: 0 min  Stress: No Stress Concern Present (04/05/2022)   [employer] of Occupational Health - Occupational Stress Questionnaire    Feeling of Stress : Not at all  Social Connections: Socially Isolated (03/01/2023)   Social Connection and Isolation Panel [NHANES]    Frequency of Communication with Friends and Family: More than three times a week    Frequency of Social Gatherings with Friends and Family: Patient unable  to answer    Attends Religious Services: Never    Database administrator or Organizations: No    Attends Banker Meetings: Never    Marital Status: Divorced  Catering manager Violence: Not At Risk (03/01/2023)   Humiliation, Afraid, Rape, and Kick questionnaire    Fear of Current or Ex-Partner: No    Emotionally Abused: No    Physically Abused: No    Sexually Abused: No   Current Outpatient Medications on File Prior to Visit  Medication Sig Dispense Refill   apixaban (ELIQUIS) 5 MG TABS tablet Take 1 tablet (5 mg total) by mouth 2 (two) times daily. 60 tablet 5   atorvastatin (LIPITOR) 20 MG tablet Take 1 tablet (20 mg total) by mouth daily. (Patient taking differently: Take 20 mg  by mouth at bedtime.) 90 tablet 3   diltiazem (CARDIZEM CD) 180 MG 24 hr capsule Take 1 capsule (180 mg total) by mouth daily. 90 capsule 3   levETIRAcetam (KEPPRA) 500 MG tablet Take 1 tablet (500 mg total) by mouth 2 (two) times daily. 180 tablet 3   metoprolol succinate (TOPROL-XL) 50 MG 24 hr tablet Take 1 tablet (50 mg total) by mouth daily. Take with or immediately following a meal. 90 tablet 3   pantoprazole (PROTONIX) 40 MG tablet Take 1 tablet (40 mg total) by mouth daily. 90 tablet 3   No current facility-administered medications on file prior to visit.   Allergies  Allergen Reactions   Codeine Other (See Comments)    Unknown reaction, severe headach   Metformin And Related Diarrhea     Review of Systems  All other systems reviewed and are negative.      Objective:   Physical Exam Vitals reviewed.  Constitutional:      Appearance: Normal appearance. He is normal weight.  Cardiovascular:     Rate and Rhythm: Normal rate. Rhythm irregular.     Heart sounds: Normal heart sounds. No murmur heard.    No friction rub. No gallop.  Pulmonary:     Effort: Pulmonary effort is normal.     Breath sounds: Normal breath sounds. No wheezing, rhonchi or rales.  Abdominal:     General:  Abdomen is flat. Bowel sounds are normal.     Palpations: Abdomen is soft.  Musculoskeletal:     Right lower leg: No edema.     Left lower leg: No edema.  Neurological:     Mental Status: He is alert and oriented to person, place, and time. Mental status is at baseline.     Motor: Weakness present.    Assessment & Plan:  Atrial fibrillation, unspecified type (HCC) Permanently increase Toprol-XL to 100 mg a day.  Heart rate is well-controlled.  Patient is asymptomatic.  Blood pressure is acceptable at 130/62.

## 2023-05-14 ENCOUNTER — Telehealth: Payer: Self-pay | Admitting: Family Medicine

## 2023-05-14 ENCOUNTER — Telehealth: Payer: Self-pay

## 2023-05-14 ENCOUNTER — Other Ambulatory Visit: Payer: Self-pay

## 2023-05-14 DIAGNOSIS — G40909 Epilepsy, unspecified, not intractable, without status epilepticus: Secondary | ICD-10-CM | POA: Diagnosis not present

## 2023-05-14 DIAGNOSIS — E1169 Type 2 diabetes mellitus with other specified complication: Secondary | ICD-10-CM

## 2023-05-14 DIAGNOSIS — E1122 Type 2 diabetes mellitus with diabetic chronic kidney disease: Secondary | ICD-10-CM | POA: Diagnosis not present

## 2023-05-14 DIAGNOSIS — N1832 Chronic kidney disease, stage 3b: Secondary | ICD-10-CM | POA: Diagnosis not present

## 2023-05-14 DIAGNOSIS — M791 Myalgia, unspecified site: Secondary | ICD-10-CM | POA: Diagnosis not present

## 2023-05-14 DIAGNOSIS — I480 Paroxysmal atrial fibrillation: Secondary | ICD-10-CM | POA: Diagnosis not present

## 2023-05-14 DIAGNOSIS — I69354 Hemiplegia and hemiparesis following cerebral infarction affecting left non-dominant side: Secondary | ICD-10-CM | POA: Diagnosis not present

## 2023-05-14 DIAGNOSIS — I130 Hypertensive heart and chronic kidney disease with heart failure and stage 1 through stage 4 chronic kidney disease, or unspecified chronic kidney disease: Secondary | ICD-10-CM | POA: Diagnosis not present

## 2023-05-14 DIAGNOSIS — I5042 Chronic combined systolic (congestive) and diastolic (congestive) heart failure: Secondary | ICD-10-CM | POA: Diagnosis not present

## 2023-05-14 DIAGNOSIS — I442 Atrioventricular block, complete: Secondary | ICD-10-CM | POA: Diagnosis not present

## 2023-05-14 MED ORDER — DEXCOM G7 RECEIVER DEVI: 1 refills | Status: DC

## 2023-05-14 MED ORDER — DEXCOM G7 SENSOR MISC: 5 refills | Status: DC

## 2023-05-14 NOTE — Telephone Encounter (Signed)
 LM on identified voice mail giving okay for verbal orders. Mjp,lpn  Copied from CRM [id number]. Topic: Clinical - Home Health Verbal Orders >> May 14, 2023  3:04 PM Everette C wrote: Caller/Agency: [place] [facility] Number: [phone] Service Requested: social work  Frequency: 0 Any new concerns about the patient? No  No further visits needed

## 2023-05-14 NOTE — Telephone Encounter (Signed)
 Copied from CRM [id number]. Topic: General - Other >> May 14, 2023 10:22 AM Emylou G wrote: Reason for CRM: lele w/[facility] home health patient requesting a discharge from nursing.. decreasing frequency down to one more week-- discharging next week...  is it okay? Pls call her [phone]

## 2023-05-14 NOTE — Telephone Encounter (Unsigned)
 Copied from CRM [id number]. Topic: Clinical - Medication Refill >> May 14, 2023 11:53 AM Elle L wrote: Most Recent Primary Care Visit:  Provider: Lynnea Ferrier T  Department: [facility]  Visit Type: OFFICE VISIT  Date: 05/13/2023  Medication: Diabetes Meter DX7   Unable to pend due to it not being on his medication list.   Has the patient contacted their pharmacy? Yes  Is this the correct pharmacy for this prescription? Yes If no, delete pharmacy and type the correct one.  This is the patient's preferred pharmacy:  [id number] [place], [place] - [address] [place] [address] Phone: [phone] Fax: [phone]  Has the prescription been filled recently? No  Is the patient out of the medication? Yes, used his last one yesterday.   Has the patient been seen for an appointment in the last year OR does the patient have an upcoming appointment? Yes  Can we respond through MyChart? No  Agent: Please be advised that Rx refills may take up to 3 business days. We ask that you follow-up with your pharmacy.

## 2023-05-15 ENCOUNTER — Ambulatory Visit: Admitting: Cardiology

## 2023-05-15 DIAGNOSIS — I4811 Longstanding persistent atrial fibrillation: Secondary | ICD-10-CM

## 2023-05-15 DIAGNOSIS — I442 Atrioventricular block, complete: Secondary | ICD-10-CM

## 2023-05-15 DIAGNOSIS — I420 Dilated cardiomyopathy: Secondary | ICD-10-CM

## 2023-05-15 DIAGNOSIS — I10 Essential (primary) hypertension: Secondary | ICD-10-CM

## 2023-05-17 DIAGNOSIS — N1832 Chronic kidney disease, stage 3b: Secondary | ICD-10-CM | POA: Diagnosis not present

## 2023-05-17 DIAGNOSIS — I480 Paroxysmal atrial fibrillation: Secondary | ICD-10-CM | POA: Diagnosis not present

## 2023-05-17 DIAGNOSIS — G40909 Epilepsy, unspecified, not intractable, without status epilepticus: Secondary | ICD-10-CM | POA: Diagnosis not present

## 2023-05-17 DIAGNOSIS — E1122 Type 2 diabetes mellitus with diabetic chronic kidney disease: Secondary | ICD-10-CM | POA: Diagnosis not present

## 2023-05-17 DIAGNOSIS — I69354 Hemiplegia and hemiparesis following cerebral infarction affecting left non-dominant side: Secondary | ICD-10-CM | POA: Diagnosis not present

## 2023-05-17 DIAGNOSIS — M791 Myalgia, unspecified site: Secondary | ICD-10-CM | POA: Diagnosis not present

## 2023-05-17 DIAGNOSIS — I442 Atrioventricular block, complete: Secondary | ICD-10-CM | POA: Diagnosis not present

## 2023-05-17 DIAGNOSIS — I130 Hypertensive heart and chronic kidney disease with heart failure and stage 1 through stage 4 chronic kidney disease, or unspecified chronic kidney disease: Secondary | ICD-10-CM | POA: Diagnosis not present

## 2023-05-17 DIAGNOSIS — I5042 Chronic combined systolic (congestive) and diastolic (congestive) heart failure: Secondary | ICD-10-CM | POA: Diagnosis not present

## 2023-05-21 DIAGNOSIS — I69354 Hemiplegia and hemiparesis following cerebral infarction affecting left non-dominant side: Secondary | ICD-10-CM | POA: Diagnosis not present

## 2023-05-21 DIAGNOSIS — G40909 Epilepsy, unspecified, not intractable, without status epilepticus: Secondary | ICD-10-CM | POA: Diagnosis not present

## 2023-05-21 DIAGNOSIS — I442 Atrioventricular block, complete: Secondary | ICD-10-CM | POA: Diagnosis not present

## 2023-05-21 DIAGNOSIS — E1122 Type 2 diabetes mellitus with diabetic chronic kidney disease: Secondary | ICD-10-CM | POA: Diagnosis not present

## 2023-05-21 DIAGNOSIS — I130 Hypertensive heart and chronic kidney disease with heart failure and stage 1 through stage 4 chronic kidney disease, or unspecified chronic kidney disease: Secondary | ICD-10-CM | POA: Diagnosis not present

## 2023-05-21 DIAGNOSIS — M791 Myalgia, unspecified site: Secondary | ICD-10-CM | POA: Diagnosis not present

## 2023-05-21 DIAGNOSIS — I5042 Chronic combined systolic (congestive) and diastolic (congestive) heart failure: Secondary | ICD-10-CM | POA: Diagnosis not present

## 2023-05-21 DIAGNOSIS — I480 Paroxysmal atrial fibrillation: Secondary | ICD-10-CM | POA: Diagnosis not present

## 2023-05-21 DIAGNOSIS — N1832 Chronic kidney disease, stage 3b: Secondary | ICD-10-CM | POA: Diagnosis not present

## 2023-05-23 DIAGNOSIS — G40909 Epilepsy, unspecified, not intractable, without status epilepticus: Secondary | ICD-10-CM | POA: Diagnosis not present

## 2023-05-23 DIAGNOSIS — E1122 Type 2 diabetes mellitus with diabetic chronic kidney disease: Secondary | ICD-10-CM | POA: Diagnosis not present

## 2023-05-23 DIAGNOSIS — I69354 Hemiplegia and hemiparesis following cerebral infarction affecting left non-dominant side: Secondary | ICD-10-CM | POA: Diagnosis not present

## 2023-05-23 DIAGNOSIS — I5042 Chronic combined systolic (congestive) and diastolic (congestive) heart failure: Secondary | ICD-10-CM | POA: Diagnosis not present

## 2023-05-23 DIAGNOSIS — I442 Atrioventricular block, complete: Secondary | ICD-10-CM | POA: Diagnosis not present

## 2023-05-23 DIAGNOSIS — I480 Paroxysmal atrial fibrillation: Secondary | ICD-10-CM | POA: Diagnosis not present

## 2023-05-23 DIAGNOSIS — N1832 Chronic kidney disease, stage 3b: Secondary | ICD-10-CM | POA: Diagnosis not present

## 2023-05-23 DIAGNOSIS — I130 Hypertensive heart and chronic kidney disease with heart failure and stage 1 through stage 4 chronic kidney disease, or unspecified chronic kidney disease: Secondary | ICD-10-CM | POA: Diagnosis not present

## 2023-05-23 DIAGNOSIS — M791 Myalgia, unspecified site: Secondary | ICD-10-CM | POA: Diagnosis not present

## 2023-05-28 ENCOUNTER — Telehealth: Payer: Self-pay | Admitting: Family Medicine

## 2023-05-28 ENCOUNTER — Other Ambulatory Visit: Payer: Self-pay

## 2023-05-28 ENCOUNTER — Telehealth: Payer: Self-pay

## 2023-05-28 ENCOUNTER — Ambulatory Visit: Payer: Self-pay

## 2023-05-28 DIAGNOSIS — I4819 Other persistent atrial fibrillation: Secondary | ICD-10-CM

## 2023-05-28 DIAGNOSIS — I5020 Unspecified systolic (congestive) heart failure: Secondary | ICD-10-CM

## 2023-05-28 DIAGNOSIS — I10 Essential (primary) hypertension: Secondary | ICD-10-CM

## 2023-05-28 MED ORDER — METOPROLOL SUCCINATE ER 100 MG PO TB24: 100.0000 mg | ORAL_TABLET | Freq: Every day | ORAL | 3 refills | Status: DC

## 2023-05-28 NOTE — Telephone Encounter (Signed)
 3rd attempt, call sent straight to voicemail. Left message for patient to call back for triage regarding prescription details. Closing encounter and routing to clinic.  Copied from CRM [id number]. Topic: Clinical - Prescription Issue >> May 28, 2023  8:43 AM Marland Kitchen D wrote: Patient says he has gone to the pharmacy to get his prescription 3 times and they haven't given it to him. It's been 2 or 3 weeks that he hasn't had it.    [id number] [place], [place] - [address] [place] [address] Phone: [phone] Fax: [phone] Hours: Not open 24 hours Reason for Disposition . Third attempt to contact caller AND no contact made. Phone number verified.  Protocols used: No Contact or Duplicate Contact Call-A-AH

## 2023-05-28 NOTE — Telephone Encounter (Signed)
 Attempted to contact patient to determine which medication he needs refilled. Received VMB. LVM to return call.  Copied from CRM [id number]. Topic: Clinical - Prescription Issue >> May 28, 2023  8:43 AM Marland Kitchen D wrote: Patient says he has gone to the pharmacy to get his prescription 3 times and they haven't given it to him. It's been 2 or 3 weeks that he hasn't had it.   [id number] [place], [place] - [address] [place] [address] Phone: [phone] Fax: [phone] Hours: Not open 24 hours

## 2023-05-28 NOTE — Telephone Encounter (Signed)
 Copied from CRM [id number]. Topic: Clinical - Prescription Issue >> May 28, 2023  8:46 AM Marland Kitchen D wrote: Patient says he has gone to the pharmacy to get his prescription 3 times and they haven't given it to him. It's been 2 or 3 weeks that he hasn't had it.   [id number] [place], [place] - [address] AT [address] [place] [address] Phone: [phone] Fax: [phone] Hours: Not open 24 hours >> May 28, 2023 10:23 AM Lilyan Gilford wrote: Created telephone encounter and routed to nurse as high priority.

## 2023-05-28 NOTE — Telephone Encounter (Signed)
 Copied from CRM [id number]. Topic: Clinical - Prescription Issue >> May 28, 2023  8:46 AM Marland Kitchen D wrote: Patient says he has gone to the pharmacy to get his prescription 3 times and they haven't given it to him. It's been 2 or 3 weeks that he hasn't had it.   [id number] [place], [place] - [address] AT [address] [place] [address] Phone: [phone] Fax: [phone] Hours: Not open 24 hours

## 2023-05-28 NOTE — Telephone Encounter (Signed)
 2nd attempt. Patient called, left VM to return the call to the office to speak to NT.  Call to ask which medications does it need refilled at this time.   Copied from CRM [id number]. Topic: Clinical - Prescription Issue >> May 28, 2023  8:43 AM Marland Kitchen D wrote: Patient says he has gone to the pharmacy to get his prescription 3 times and they haven't given it to him. It's been 2 or 3 weeks that he hasn't had it.    [id number] [place], [place] - [address] [place] [address] Phone: [phone] Fax: [phone] Hours: Not open 24 hours

## 2023-05-29 DIAGNOSIS — I5042 Chronic combined systolic (congestive) and diastolic (congestive) heart failure: Secondary | ICD-10-CM | POA: Diagnosis not present

## 2023-05-29 DIAGNOSIS — I480 Paroxysmal atrial fibrillation: Secondary | ICD-10-CM | POA: Diagnosis not present

## 2023-05-29 DIAGNOSIS — M791 Myalgia, unspecified site: Secondary | ICD-10-CM | POA: Diagnosis not present

## 2023-05-29 DIAGNOSIS — I130 Hypertensive heart and chronic kidney disease with heart failure and stage 1 through stage 4 chronic kidney disease, or unspecified chronic kidney disease: Secondary | ICD-10-CM | POA: Diagnosis not present

## 2023-05-29 DIAGNOSIS — N1832 Chronic kidney disease, stage 3b: Secondary | ICD-10-CM | POA: Diagnosis not present

## 2023-05-29 DIAGNOSIS — I69354 Hemiplegia and hemiparesis following cerebral infarction affecting left non-dominant side: Secondary | ICD-10-CM | POA: Diagnosis not present

## 2023-05-29 DIAGNOSIS — I442 Atrioventricular block, complete: Secondary | ICD-10-CM | POA: Diagnosis not present

## 2023-05-29 DIAGNOSIS — G40909 Epilepsy, unspecified, not intractable, without status epilepticus: Secondary | ICD-10-CM | POA: Diagnosis not present

## 2023-05-29 DIAGNOSIS — E1122 Type 2 diabetes mellitus with diabetic chronic kidney disease: Secondary | ICD-10-CM | POA: Diagnosis not present

## 2023-06-05 ENCOUNTER — Telehealth: Payer: Self-pay | Admitting: Hematology and Oncology

## 2023-06-05 DIAGNOSIS — I5042 Chronic combined systolic (congestive) and diastolic (congestive) heart failure: Secondary | ICD-10-CM | POA: Diagnosis not present

## 2023-06-05 DIAGNOSIS — G40909 Epilepsy, unspecified, not intractable, without status epilepticus: Secondary | ICD-10-CM | POA: Diagnosis not present

## 2023-06-05 DIAGNOSIS — I442 Atrioventricular block, complete: Secondary | ICD-10-CM | POA: Diagnosis not present

## 2023-06-05 DIAGNOSIS — M791 Myalgia, unspecified site: Secondary | ICD-10-CM | POA: Diagnosis not present

## 2023-06-05 DIAGNOSIS — I480 Paroxysmal atrial fibrillation: Secondary | ICD-10-CM | POA: Diagnosis not present

## 2023-06-05 DIAGNOSIS — N1832 Chronic kidney disease, stage 3b: Secondary | ICD-10-CM | POA: Diagnosis not present

## 2023-06-05 DIAGNOSIS — E1122 Type 2 diabetes mellitus with diabetic chronic kidney disease: Secondary | ICD-10-CM | POA: Diagnosis not present

## 2023-06-05 DIAGNOSIS — I69354 Hemiplegia and hemiparesis following cerebral infarction affecting left non-dominant side: Secondary | ICD-10-CM | POA: Diagnosis not present

## 2023-06-05 DIAGNOSIS — I130 Hypertensive heart and chronic kidney disease with heart failure and stage 1 through stage 4 chronic kidney disease, or unspecified chronic kidney disease: Secondary | ICD-10-CM | POA: Diagnosis not present

## 2023-06-05 NOTE — Telephone Encounter (Signed)
 Left vm about rescheduled appt date and time.

## 2023-06-06 ENCOUNTER — Ambulatory Visit (INDEPENDENT_AMBULATORY_CARE_PROVIDER_SITE_OTHER): Payer: Medicare HMO | Admitting: *Deleted

## 2023-06-06 DIAGNOSIS — Z1211 Encounter for screening for malignant neoplasm of colon: Secondary | ICD-10-CM

## 2023-06-06 DIAGNOSIS — Z0000 Encounter for general adult medical examination without abnormal findings: Secondary | ICD-10-CM

## 2023-06-06 NOTE — Patient Instructions (Signed)
 [name] , Thank you for taking time to come for your Medicare Wellness Visit. I appreciate your ongoing commitment to your health goals. Please review the following plan we discussed and let me know if I can assist you in the future.   Screening recommendations/referrals: Colonoscopy: Education provided Recommended yearly ophthalmology/optometry visit for glaucoma screening and checkup Recommended yearly dental visit for hygiene and checkup  Vaccinations: Influenza vaccine: Education provided Pneumococcal vaccine: Education provided Tdap vaccine: Education provided Shingles vaccine: Education provided     Preventive Care 65 Years and Older, Male Preventive care refers to lifestyle choices and visits with your health care provider that can promote health and wellness. What does preventive care include? A yearly physical exam. This is also called an annual well check. Dental exams once or twice a year. Routine eye exams. Ask your health care provider how often you should have your eyes checked. Personal lifestyle choices, including: Daily care of your teeth and gums. Regular physical activity. Eating a healthy diet. Avoiding tobacco and drug use. Limiting alcohol use. Practicing safe sex. Taking low doses of aspirin every day. Taking vitamin and mineral supplements as recommended by your health care provider. What happens during an annual well check? The services and screenings done by your health care provider during your annual well check will depend on your age, overall health, lifestyle risk factors, and family history of disease. Counseling  Your health care provider may ask you questions about your: Alcohol use. Tobacco use. Drug use. Emotional well-being. Home and relationship well-being. Sexual activity. Eating habits. History of falls. Memory and ability to understand (cognition). Work and work Astronomer. Screening  You may have the following tests or  measurements: Height, weight, and BMI. Blood pressure. Lipid and cholesterol levels. These may be checked every 5 years, or more frequently if you are over 9 years old. Skin check. Lung cancer screening. You may have this screening every year starting at age 70 if you have a 30-pack-year history of smoking and currently smoke or have quit within the past 15 years. Fecal occult blood test (FOBT) of the stool. You may have this test every year starting at age 17. Flexible sigmoidoscopy or colonoscopy. You may have a sigmoidoscopy every 5 years or a colonoscopy every 10 years starting at age 79. Prostate cancer screening. Recommendations will vary depending on your family history and other risks. Hepatitis C blood test. Hepatitis B blood test. Sexually transmitted disease (STD) testing. Diabetes screening. This is done by checking your blood sugar (glucose) after you have not eaten for a while (fasting). You may have this done every 1-3 years. Abdominal aortic aneurysm (AAA) screening. You may need this if you are a current or former smoker. Osteoporosis. You may be screened starting at age 8 if you are at high risk. Talk with your health care provider about your test results, treatment options, and if necessary, the need for more tests. Vaccines  Your health care provider may recommend certain vaccines, such as: Influenza vaccine. This is recommended every year. Tetanus, diphtheria, and acellular pertussis (Tdap, Td) vaccine. You may need a Td booster every 10 years. Zoster vaccine. You may need this after age 62. Pneumococcal 13-valent conjugate (PCV13) vaccine. One dose is recommended after age 63. Pneumococcal polysaccharide (PPSV23) vaccine. One dose is recommended after age 71. Talk to your health care provider about which screenings and vaccines you need and how often you need them. This information is not intended to replace advice given to you  by your health care provider. Make sure  you discuss any questions you have with your health care provider. Document Released: 03/04/2015 Document Revised: 10/26/2015 Document Reviewed: 12/07/2014 Elsevier Interactive Patient Education  2017 [employer].  Fall Prevention in the Home Falls can cause injuries. They can happen to people of all ages. There are many things you can do to make your home safe and to help prevent falls. What can I do on the outside of my home? Regularly fix the edges of walkways and driveways and fix any cracks. Remove anything that might make you trip as you walk through a door, such as a raised step or threshold. Trim any bushes or trees on the path to your home. Use bright outdoor lighting. Clear any walking paths of anything that might make someone trip, such as rocks or tools. Regularly check to see if handrails are loose or broken. Make sure that both sides of any steps have handrails. Any raised decks and porches should have guardrails on the edges. Have any leaves, snow, or ice cleared regularly. Use sand or salt on walking paths during winter. Clean up any spills in your garage right away. This includes oil or grease spills. What can I do in the bathroom? Use night lights. Install grab bars by the toilet and in the tub and shower. Do not use towel bars as grab bars. Use non-skid mats or decals in the tub or shower. If you need to sit down in the shower, use a plastic, non-slip stool. Keep the floor dry. Clean up any water that spills on the floor as soon as it happens. Remove soap buildup in the tub or shower regularly. Attach bath mats securely with double-sided non-slip rug tape. Do not have throw rugs and other things on the floor that can make you trip. What can I do in the bedroom? Use night lights. Make sure that you have a light by your bed that is easy to reach. Do not use any sheets or blankets that are too big for your bed. They should not hang down onto the floor. Have a firm  chair that has side arms. You can use this for support while you get dressed. Do not have throw rugs and other things on the floor that can make you trip. What can I do in the kitchen? Clean up any spills right away. Avoid walking on wet floors. Keep items that you use a lot in easy-to-reach places. If you need to reach something above you, use a strong step stool that has a grab bar. Keep electrical cords out of the way. Do not use floor polish or wax that makes floors slippery. If you must use wax, use non-skid floor wax. Do not have throw rugs and other things on the floor that can make you trip. What can I do with my stairs? Do not leave any items on the stairs. Make sure that there are handrails on both sides of the stairs and use them. Fix handrails that are broken or loose. Make sure that handrails are as long as the stairways. Check any carpeting to make sure that it is firmly attached to the stairs. Fix any carpet that is loose or worn. Avoid having throw rugs at the top or bottom of the stairs. If you do have throw rugs, attach them to the floor with carpet tape. Make sure that you have a light switch at the top of the stairs and the bottom of the stairs. If  you do not have them, ask someone to add them for you. What else can I do to help prevent falls? Wear shoes that: Do not have high heels. Have rubber bottoms. Are comfortable and fit you well. Are closed at the toe. Do not wear sandals. If you use a stepladder: Make sure that it is fully opened. Do not climb a closed stepladder. Make sure that both sides of the stepladder are locked into place. Ask someone to hold it for you, if possible. Clearly mark and make sure that you can see: Any grab bars or handrails. First and last steps. Where the edge of each step is. Use tools that help you move around (mobility aids) if they are needed. These include: Canes. Walkers. Scooters. Crutches. Turn on the lights when you go  into a dark area. Replace any light bulbs as soon as they burn out. Set up your furniture so you have a clear path. Avoid moving your furniture around. If any of your floors are uneven, fix them. If there are any pets around you, be aware of where they are. Review your medicines with your doctor. Some medicines can make you feel dizzy. This can increase your chance of falling. Ask your doctor what other things that you can do to help prevent falls. This information is not intended to replace advice given to you by your health care provider. Make sure you discuss any questions you have with your health care provider. Document Released: 12/02/2008 Document Revised: 07/14/2015 Document Reviewed: 03/12/2014 Elsevier Interactive Patient Education  2017 [employer].

## 2023-06-06 NOTE — Progress Notes (Signed)
 Subjective:   Brian [name] is a 68 y.o. male who presents for Medicare Annual/Subsequent preventive examination.  Visit Complete: Virtual I connected with  Brian [name] on 06/06/23 by a audio enabled telemedicine application and verified that I am speaking with the correct person using two identifiers.  Patient Location: Home  Provider Location: Home Office  I discussed the limitations of evaluation and management by telemedicine. The patient expressed understanding and agreed to proceed.  Vital Signs: Because this visit was a virtual/telehealth visit, some criteria may be missing or patient reported. Any vitals not documented were not able to be obtained and vitals that have been documented are patient reported.   Cardiac Risk Factors include: advanced age (>66men, >41 women);diabetes mellitus;male gender     Objective:    There were no vitals filed for this visit. There is no height or weight on file to calculate BMI.     06/06/2023    2:44 PM 03/01/2023    6:00 PM 01/09/2023   11:00 PM 01/09/2023    9:00 PM 01/07/2023   11:37 AM 01/04/2023   12:38 PM 12/14/2022    1:30 PM  Advanced Directives  Does Patient Have a Medical Advance Directive? No No No  Yes No No  Would patient like information on creating a medical advance directive? No - Patient declined No - Patient declined No - Patient declined No - Patient declined   No - Patient declined    Current Medications (verified) Outpatient Encounter Medications as of 06/06/2023  Medication Sig   apixaban (ELIQUIS) 5 MG TABS tablet Take 1 tablet (5 mg total) by mouth 2 (two) times daily.   atorvastatin (LIPITOR) 20 MG tablet Take 1 tablet (20 mg total) by mouth daily. (Patient taking differently: Take 20 mg by mouth at bedtime.)   Continuous Glucose Receiver (DEXCOM G7 RECEIVER) DEVI Use to continuously check blood sugar daily.   Continuous Glucose Sensor (DEXCOM G7 SENSOR) MISC Use to continuously monitor glucose  daily. Change meter every 10 days.   diltiazem (CARDIZEM CD) 180 MG 24 hr capsule Take 1 capsule (180 mg total) by mouth daily.   levETIRAcetam (KEPPRA) 500 MG tablet Take 1 tablet (500 mg total) by mouth 2 (two) times daily.   metoprolol succinate (TOPROL-XL) 100 MG 24 hr tablet Take 1 tablet (100 mg total) by mouth daily. Take with or immediately following a meal.   pantoprazole (PROTONIX) 40 MG tablet Take 1 tablet (40 mg total) by mouth daily.   No facility-administered encounter medications on file as of 06/06/2023.    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 [name], laser therapy OD   Seizures [facility])    pt reports this is from low blood sugar   Past Surgical History:  Procedure Laterality Date   BIOPSY  03/01/2023   Procedure: BIOPSY;  Surgeon: Napoleon Form, MD;  Location: [facility] ENDOSCOPY;  Service: Gastroenterology;;   BRONCHIAL NEEDLE ASPIRATION BIOPSY  10/30/2022   Procedure: BRONCHIAL NEEDLE  ASPIRATION BIOPSIES;  Surgeon: Josephine Igo, DO;  Location: [facility] ENDOSCOPY;  Service: Cardiopulmonary;;   COLONOSCOPY     DIRECT LARYNGOSCOPY N/A 02/10/2020   Procedure: DIRECT LARYNGOSCOPY WITH BIOPSY;  Surgeon: Newman Pies, MD;  Location: [facility] OR;  Service: ENT;  Laterality: N/A;   Ear surgery as a child     ESOPHAGOGASTRODUODENOSCOPY N/A 03/01/2023   Procedure: ESOPHAGOGASTRODUODENOSCOPY (EGD);  Surgeon: Napoleon Form, MD;  Location: [facility] ENDOSCOPY;  Service: Gastroenterology;  Laterality: N/A;   IR GASTROSTOMY TUBE MOD SED  03/23/2020   IR GASTROSTOMY TUBE REMOVAL   06/01/2020   IR IMAGING GUIDED PORT INSERTION  03/09/2020   IR REMOVAL TUN ACCESS W/ PORT W/O FL MOD SED  02/08/2021   TEMPORARY PACEMAKER N/A 02/26/2023   Procedure: TEMPORARY PACEMAKER;  Surgeon: Marykay Lex, MD;  Location: [facility] INVASIVE CV LAB;  Service: Cardiovascular;  Laterality: N/A;   THORACENTESIS  10/30/2022   Procedure: THORACENTESIS;  Surgeon: Josephine Igo, DO;  Location: [facility] ENDOSCOPY;  Service: Cardiopulmonary;;  L pleural effusion   TONSILLECTOMY     VIDEO BRONCHOSCOPY Left 10/30/2022   Procedure: VIDEO BRONCHOSCOPY WITHOUT FLUORO;  Surgeon: Josephine Igo, DO;  Location: [facility] ENDOSCOPY;  Service: Cardiopulmonary;  Laterality: Left;   VIDEO BRONCHOSCOPY WITH ENDOBRONCHIAL ULTRASOUND Bilateral 10/30/2022   Procedure: VIDEO BRONCHOSCOPY WITH ENDOBRONCHIAL ULTRASOUND;  Surgeon: Josephine Igo, DO;  Location: [facility] ENDOSCOPY;  Service: Cardiopulmonary;  Laterality: Bilateral;   Family History  Problem Relation Age of Onset   Colon cancer Mother    Hyperlipidemia Father    Hypertension Father    Breast cancer Sister    Coronary artery disease Neg Hx        no early CAD.   Stomach cancer Neg Hx    Rectal cancer Neg Hx    Esophageal cancer Neg Hx    Social History   Socioeconomic History   Marital status: Divorced    Spouse name: Not on file   Number of children: 2   Years of education: 12   Highest education level: 12th grade  Occupational History   Occupation: Disabled  Tobacco Use   Smoking status: Never    Passive exposure: Never   Smokeless tobacco: Never   Tobacco comments:    Verified by son, Brian [name]  Vaping Use   Vaping status: Never Used  Substance and Sexual Activity   Alcohol use: Yes    Alcohol/week: 0.0 standard drinks of alcohol    Comment: seldom   Drug use: Not Currently    Frequency: 7.0 times per week    Types: Marijuana    Comment: marijauna for arthritis, last 03-13-20   Sexual activity: Not Currently    Partners: Female  Other  Topics Concern   Not on file  Social History Narrative   Two children.  5 grandchildren.     Social Drivers of Corporate investment banker Strain: Low Risk  (06/06/2023)   Overall Financial Resource Strain (CARDIA)    Difficulty of Paying Living Expenses: Not hard at all  Food Insecurity: No Food Insecurity (06/06/2023)   Hunger Vital Sign    Worried About Running Out of Food in the Last Year: Never true    Ran Out of Food in the Last Year: Never true  Transportation Needs: Unmet Transportation Needs (06/06/2023)   PRAPARE - Transportation    Lack of Transportation (Medical): Yes    Lack of Transportation (Non-Medical): Yes  Physical Activity: Inactive (06/06/2023)   Exercise Vital  Sign    Days of Exercise per Week: 0 days    Minutes of Exercise per Session: 0 min  Stress: No Stress Concern Present (06/06/2023)   [employer] of Occupational Health - Occupational Stress Questionnaire    Feeling of Stress : Not at all  Social Connections: Socially Isolated (06/06/2023)   Social Connection and Isolation Panel [NHANES]    Frequency of Communication with Friends and Family: Three times a week    Frequency of Social Gatherings with Friends and Family: Once a week    Attends Religious Services: Never    Database administrator or Organizations: No    Attends Engineer, structural: Never    Marital Status: Divorced    Tobacco Counseling Counseling given: Not Answered Tobacco comments: Verified by son, Namir Neto   Clinical Intake:  Pre-visit preparation completed: No  Pain : No/denies pain     Diabetes: Yes CBG done?: No Did pt. bring in CBG monitor from home?: No  How often do you need to have someone help you when you read instructions, pamphlets, or other written materials from your doctor or pharmacy?: 1 - Never  Interpreter Needed?: No  Information entered by :: Remi Haggard LPN   Activities of Daily Living    06/06/2023    2:54 PM 03/01/2023     6:00 PM  In your present state of health, do you have any difficulty performing the following activities:  Hearing? 0 0  Vision? 0 0  Difficulty concentrating or making decisions? 0 0  Walking or climbing stairs? 1   Dressing or bathing? 0   Doing errands, shopping? 0   Preparing Food and eating ? N   Using the Toilet? N   In the past six months, have you accidently leaked urine? N   Do you have problems with loss of bowel control? N   Managing your Medications? N   Managing your Finances? N   Housekeeping or managing your Housekeeping? N     Patient Care Team: [name], MD as PCP - General (Family Medicine) Rollene Rotunda, MD as PCP - Cardiology (Cardiology) [name], Lise Auer, RN as Oncology Nurse Navigator Lonie Peak, MD as Consulting Physician (Radiation Oncology) Rachel Moulds, MD as Consulting Physician (Hematology and Oncology) Anabel Bene, RD as Dietitian (Nutrition) Sundra Aland Karie Georges, [name] as Speech Language Pathologist (Speech Pathology) Jeanella Craze, Remi Deter, PT as Physical Therapist (Physical Therapy) Sallee Lange, [name] (Inactive) as Social Worker (General Practice) Newman Pies, MD as Consulting Physician (Otolaryngology) Erroll Luna, [facility] (Inactive) as Pharmacist (Pharmacist) Rollene Rotunda, MD as Consulting Physician (Cardiology)  Indicate any recent Medical Services you may have received from other than [facility] providers in the past year (date may be approximate).     Assessment:   This is a routine wellness examination for Ashish.  Hearing/Vision screen Hearing Screening - Comments:: No trouble hearing Vision Screening - Comments:: [place] Not [place] to date   Goals Addressed             This Visit's Progress    Patient Stated       Eat healthy        Depression Screen    06/06/2023    2:48 PM 05/10/2023   10:45 AM 04/05/2022   10:56 AM 12/26/2021    8:28 AM 11/14/2021   10:48 AM 03/30/2021    9:03 AM  04/21/2019    9:00 AM  PHQ 2/9 Scores  PHQ -  2 Score 0 0 0 2 0 0 0  PHQ- 9 Score 0   3     Exception Documentation     Other- indicate reason in comment box    Not completed     Alzheimer's/Dementia      Fall Risk    06/06/2023    2:44 PM 05/10/2023   10:45 AM 04/05/2022    8:59 AM 04/04/2022    6:27 PM 12/26/2021    8:28 AM  Fall Risk   Falls in the past year? 0 0 1 1 0  Number falls in past yr: 0 0 1 1 0  Injury with Fall? 0 0 0 0 0  Risk for fall due to :  No Fall Risks   No Fall Risks  Follow up Falls evaluation completed;Education provided;Falls prevention discussed Falls prevention discussed;Falls evaluation completed Falls evaluation completed;Education provided;Falls prevention discussed  Falls prevention discussed    MEDICARE RISK AT HOME: Medicare Risk at Home Any stairs in or around the home?: Yes If so, are there any without handrails?: No Home free of loose throw rugs in walkways, pet beds, electrical cords, etc?: Yes Adequate lighting in your home to reduce risk of falls?: Yes Life alert?: No Use of a cane, walker or w/c?: Yes Grab bars in the bathroom?: Yes Shower chair or bench in shower?: Yes Elevated toilet seat or a handicapped toilet?: No  TIMED UP AND GO:  Was the test performed?  No    Cognitive Function:        06/06/2023    2:46 PM 04/05/2022   10:59 AM 03/30/2021    9:10 AM  6CIT Screen  What Year?  0 points 0 points  What month?  0 points 3 points  What time? 0 points 0 points 0 points  Count back from 20 0 points 0 points 0 points  Months in reverse 4 points 2 points 4 points  Repeat phrase 2 points 2 points 2 points  Total Score  4 points 9 points    Immunizations Immunization History  Administered Date(s) Administered   Fluad Quad(high Dose 65+) 01/05/2019, 04/05/2022   Influenza Whole 12/11/2009   Influenza, Seasonal, Injecte, Preservative Fre 11/06/2022   Influenza,inj,Quad PF,6+ Mos 11/22/2015, 11/06/2016, 10/29/2017, 01/31/2021    PFIZER(Purple Top)SARS-COV-2 Vaccination 03/02/2020, 03/24/2020   Pneumococcal Conjugate-13 01/29/2013   Pneumococcal Polysaccharide-23 12/11/2009    TDAP status: Due, Education has been provided regarding the importance of this vaccine. Advised may receive this vaccine at local pharmacy or Health Dept. Aware to provide a copy of the vaccination record if obtained from local pharmacy or Health Dept. Verbalized acceptance and understanding.  Flu Vaccine status: Due, Education has been provided regarding the importance of this vaccine. Advised may receive this vaccine at local pharmacy or Health Dept. Aware to provide a copy of the vaccination record if obtained from local pharmacy or Health Dept. Verbalized acceptance and understanding.  Pneumococcal vaccine status: Due, Education has been provided regarding the importance of this vaccine. Advised may receive this vaccine at local pharmacy or Health Dept. Aware to provide a copy of the vaccination record if obtained from local pharmacy or Health Dept. Verbalized acceptance and understanding.  Covid-19 vaccine status: Information provided on how to obtain vaccines.   Qualifies for Shingles Vaccine? Yes   Zostavax completed No   Shingrix Completed?: No.    Education has been provided regarding the importance of this vaccine. Patient has been advised to call insurance company to determine out  of pocket expense if they have not yet received this vaccine. Advised may also receive vaccine at local pharmacy or Health Dept. Verbalized acceptance and understanding.  Screening Tests Health Maintenance  Topic Date Due   Diabetic kidney evaluation - Urine ACR  Never done   COVID-19 Vaccine (3 - Pfizer risk series) 04/21/2020   OPHTHALMOLOGY EXAM  06/21/2021   HEMOGLOBIN A1C  05/31/2023   Colonoscopy  08/12/2023   Zoster Vaccines- Shingrix (1 of 2) 08/10/2023 (Originally 07/23/1974)   Pneumonia Vaccine 96+ Years old (3 of 3 - PPSV23, PCV20 or PCV21)  05/09/2024 (Originally 12/12/2014)   INFLUENZA VACCINE  09/20/2023   LIPID PANEL  10/16/2023   FOOT EXAM  11/06/2023   Diabetic kidney evaluation - eGFR measurement  04/21/2024   Medicare Annual Wellness (AWV)  06/05/2024   Hepatitis C Screening  Completed   HPV VACCINES  Aged Out   Meningococcal B Vaccine  Aged Out   DTaP/Tdap/Td  Discontinued    Health Maintenance  Health Maintenance Due  Topic Date Due   Diabetic kidney evaluation - Urine ACR  Never done   COVID-19 Vaccine (3 - Pfizer risk series) 04/21/2020   OPHTHALMOLOGY EXAM  06/21/2021   HEMOGLOBIN A1C  05/31/2023   Colonoscopy  08/12/2023    Colorectal cancer screening: Referral to GI placed  . Pt aware the office will call re: appt.  Lung Cancer Screening: (Low Dose CT Chest recommended if Age 38-80 years, 20 pack-year currently smoking OR have quit w/in 15years.) does not qualify.   Lung Cancer Screening Referral:   Additional Screening:  Hepatitis C Screening: does not qualify; Completed 2017  Vision Screening: Recommended annual ophthalmology exams for early detection of glaucoma and other disorders of the eye. Is the patient up to date with their annual eye exam?  No  Who is the provider or what is the name of the office in which the patient attends annual eye exams? [facility].    Education provided If pt is not established with a provider, would they like to be referred to a provider to establish care? No .   Dental Screening: Recommended annual dental exams for proper oral hygiene  Nutrition Risk Assessment:  Has the patient had any N/V/D within the last 2 months?  No  Does the patient have any non-healing wounds?  No  Has the patient had any unintentional weight loss or weight gain?  No   Diabetes:  Is the patient diabetic?  Yes  If diabetic, was a CBG obtained today?  No  Did the patient bring in their glucometer from home?  No  How often do you monitor your CBG's? monitor.   Financial  Strains and Diabetes Management:  Are you having any financial strains with the device, your supplies or your medication? No .  Does the patient want to be seen by Chronic Care Management for management of their diabetes?  No  Would the patient like to be referred to a Nutritionist or for Diabetic Management?  No   Diabetic Exams:  Diabetic Eye Exam: . Overdue for diabetic eye exam. Pt has been advised about the importance in completing this exam.  Diabetic Foot Exam: . Pt has been advised about the importance in completing this exam. .    Community Resource Referral / Chronic Care Management: CRR required this visit?  No   CCM required this visit?  No     Plan:     I have personally reviewed and noted  the following in the patient's chart:   Medical and social history Use of alcohol, tobacco or illicit drugs  Current medications and supplements including opioid prescriptions. Patient is not currently taking opioid prescriptions. Functional ability and status Nutritional status Physical activity Advanced directives List of other physicians Hospitalizations, surgeries, and ER visits in previous 12 months Vitals Screenings to include cognitive, depression, and falls Referrals and appointments  In addition, I have reviewed and discussed with patient certain preventive protocols, quality metrics, and best practice recommendations. A written personalized care plan for preventive services as well as general preventive health recommendations were provided to patient.     Kieth Pelt, LPN   05/28/8117   After Visit Summary: (MyChart) Due to this being a telephonic visit, the after visit summary with patients personalized plan was offered to patient via MyChart   Nurse Notes:

## 2023-06-12 DIAGNOSIS — M791 Myalgia, unspecified site: Secondary | ICD-10-CM | POA: Diagnosis not present

## 2023-06-12 DIAGNOSIS — I5042 Chronic combined systolic (congestive) and diastolic (congestive) heart failure: Secondary | ICD-10-CM | POA: Diagnosis not present

## 2023-06-12 DIAGNOSIS — G40909 Epilepsy, unspecified, not intractable, without status epilepticus: Secondary | ICD-10-CM | POA: Diagnosis not present

## 2023-06-12 DIAGNOSIS — I442 Atrioventricular block, complete: Secondary | ICD-10-CM | POA: Diagnosis not present

## 2023-06-12 DIAGNOSIS — I69354 Hemiplegia and hemiparesis following cerebral infarction affecting left non-dominant side: Secondary | ICD-10-CM | POA: Diagnosis not present

## 2023-06-12 DIAGNOSIS — I480 Paroxysmal atrial fibrillation: Secondary | ICD-10-CM | POA: Diagnosis not present

## 2023-06-12 DIAGNOSIS — N1832 Chronic kidney disease, stage 3b: Secondary | ICD-10-CM | POA: Diagnosis not present

## 2023-06-12 DIAGNOSIS — E1122 Type 2 diabetes mellitus with diabetic chronic kidney disease: Secondary | ICD-10-CM | POA: Diagnosis not present

## 2023-06-12 DIAGNOSIS — I130 Hypertensive heart and chronic kidney disease with heart failure and stage 1 through stage 4 chronic kidney disease, or unspecified chronic kidney disease: Secondary | ICD-10-CM | POA: Diagnosis not present

## 2023-06-13 ENCOUNTER — Telehealth: Payer: Self-pay | Admitting: *Deleted

## 2023-06-13 NOTE — Telephone Encounter (Signed)
 CALLED PATIENT TO INFORM OF CT FOR 06-20-23- ARRIVAL TIME- 3:30 PM @ [facility] NO RESTRICTIONS TO SCAN, PATIENT TO RECEIVE RESULTS FROM DR. SQUIRE ON 06-28-23 @ 2 PM, LVM FOR A RETURN CALL

## 2023-06-18 DIAGNOSIS — M7989 Other specified soft tissue disorders: Secondary | ICD-10-CM | POA: Insufficient documentation

## 2023-06-18 DIAGNOSIS — E118 Type 2 diabetes mellitus with unspecified complications: Secondary | ICD-10-CM | POA: Insufficient documentation

## 2023-06-18 NOTE — Progress Notes (Deleted)
  Cardiology Office Note:   Date:  06/18/2023  ID:  Brian [name], DOB 1955-12-02, MRN [id number] PCP: Austine Lefort, MD  [facility] Providers Cardiologist:  Eilleen Grates, MD {  History of Present Illness:   Brian [name] is a 68 y.o. male who is referred for evaluation of syncope.  He has a history of atrial fibrillation and cardiomyopathy.  I see an ejection fraction from 2011 of 25 to 30%.  He had a CVA treated with tPA at that time.   He had difficult to control hypertension.  I saw him last in 2013.  At that time he was said to have an ejection fraction of 60% although I do not see this result.  He did have a stress perfusion study in 2012.  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  He had atrial fib with rapid rate.  Echo demonstrated an  EF of 55 - 60%.     He says he has been doing well since he was last seen.  He does not feel his fibrillation.  He says his cancer is in remission.  He tolerates anticoagulation. The patient denies any new symptoms such as chest discomfort, neck or arm discomfort. There has been no new shortness of breath, PND or orthopnea. There have been no reported palpitations, presyncope or syncope.   ROS: ***  Studies Reviewed:    EKG:       ***  Risk Assessment/Calculations:   {Does this patient have ATRIAL FIBRILLATION?:[phone]} No BP recorded.  {Refresh Note OR Click here to enter BP  :1}***        Physical Exam:   VS:  There were no vitals taken for this visit.   Wt Readings from Last 3 Encounters:  05/13/23 170 lb (77.1 kg)  05/10/23 168 lb (76.2 kg)  04/22/23 168 lb 6.4 oz (76.4 kg)     GEN: Well nourished, well developed in no acute distress NECK: No JVD; No carotid bruits CARDIAC: ***RR, *** murmurs, rubs, gallops RESPIRATORY:  Clear to auscultation without rales, wheezing or rhonchi  ABDOMEN: Soft, non-tender, non-distended EXTREMITIES:  No  edema; No deformity   ASSESSMENT AND PLAN:   Atrial fibrillation:    ***   He tolerates anticoagulation and has good rate control at home.  I have suggested that he get a CBC when he see his primary .  Otherwise, no change in therapy.    Syncope: ***  He has had no further syncope.  No change in therapy.   CKD IIIB: His last creatinine was ***  down to 1.61.  No change in therapy.   EDEMA:  ***  He has some mild lower extremity swelling but it has not been changed.  No change in therapy.   DM:   ***   Will try to switch him to Jardiance  but this was cost prohibitive for the patient.  No change in therapy.     Follow up ***  Signed, Eilleen Grates, MD

## 2023-06-19 ENCOUNTER — Ambulatory Visit: Payer: Medicare HMO | Attending: Cardiology | Admitting: Cardiology

## 2023-06-19 DIAGNOSIS — E118 Type 2 diabetes mellitus with unspecified complications: Secondary | ICD-10-CM

## 2023-06-19 DIAGNOSIS — M7989 Other specified soft tissue disorders: Secondary | ICD-10-CM

## 2023-06-19 DIAGNOSIS — N1832 Chronic kidney disease, stage 3b: Secondary | ICD-10-CM

## 2023-06-19 DIAGNOSIS — I482 Chronic atrial fibrillation, unspecified: Secondary | ICD-10-CM

## 2023-06-20 ENCOUNTER — Ambulatory Visit (HOSPITAL_COMMUNITY)

## 2023-06-21 ENCOUNTER — Other Ambulatory Visit: Payer: Self-pay | Admitting: Family Medicine

## 2023-06-21 NOTE — Telephone Encounter (Signed)
 Copied from CRM [id number]. Topic: Clinical - Medication Refill >> Jun 21, 2023 12:53 PM Marissa P wrote: Most Recent Primary Care Visit:  Provider: GREER, JULIE M  Department: [facility]  Visit Type: MEDICARE AWV, SEQUENTIAL  Date: 06/06/2023  Medication: diltiazem  (CARDIZEM  CD) 180 MG 24 hr capsule and acid reflux med   Has the patient contacted their pharmacy? No (Agent: If no, request that the patient contact the pharmacy for the refill. If patient does not wish to contact the pharmacy document the reason why and proceed with request.) (Agent: If yes, when and what did the pharmacy advise?)  Is this the correct pharmacy for this prescription? Yes If no, delete pharmacy and type the correct one.  This is the patient's preferred pharmacy:  [id number] Jonette Nestle, [place] - [address] [place] [address] Phone: [phone] Fax: [phone]  Has the prescription been filled recently? No  Is the patient out of the medication? Yes  Has the patient been seen for an appointment in the last year OR does the patient have an upcoming appointment? Yes  Can we respond through MyChart? No  Agent: Please be advised that Rx refills may take up to 3 business days. We ask that you follow-up with your pharmacy.

## 2023-06-24 ENCOUNTER — Ambulatory Visit: Payer: Medicare HMO | Admitting: Podiatry

## 2023-06-24 NOTE — Telephone Encounter (Signed)
 Pharmacy needs to be called to verify receipt, refill sent in on 04/22/23 #90/3 refills for both medications.

## 2023-06-25 NOTE — Telephone Encounter (Signed)
 Too soon for refill.  Requested Prescriptions  Pending Prescriptions Disp Refills   diltiazem  (CARDIZEM  CD) 180 MG 24 hr capsule 90 capsule 3    Sig: Take 1 capsule (180 mg total) by mouth daily.     Cardiovascular: Calcium  Channel Blockers 3 Failed - 06/25/2023  9:09 AM      Failed - Cr in normal range and within 360 days    Creat  Date Value Ref Range Status  04/22/2023 1.90 (H) 0.70 - 1.35 mg/dL Final   Creatinine, Urine  Date Value Ref Range Status  12/12/2009 226.8 mg/dL Final         Passed - ALT in normal range and within 360 days    ALT  Date Value Ref Range Status  04/22/2023 12 9 - 46 U/L Final  03/20/2021 13 0 - 44 U/L Final         Passed - AST in normal range and within 360 days    AST  Date Value Ref Range Status  04/22/2023 19 10 - 35 U/L Final  03/20/2021 14 (L) 15 - 41 U/L Final         Passed - Last BP in normal range    BP Readings from Last 1 Encounters:  05/13/23 130/62         Passed - Last Heart Rate in normal range    Pulse Readings from Last 1 Encounters:  05/13/23 88         Passed - Valid encounter within last 6 months    Recent Outpatient Visits           1 month ago Atrial fibrillation, unspecified type [facility])   [place] [facility] Austine Lefort, MD   1 month ago Atrial fibrillation, unspecified type [facility])   [place] [facility] Austine Lefort, MD   2 months ago Atrial fibrillation, unspecified type [facility]   [place] [facility] Austine Lefort, MD   7 months ago Stage 3b chronic kidney disease [facility])   [place] [facility] Austine Lefort, MD   8 months ago Essential hypertension, benign   [place] [facility] Pickard, Cisco Crest, MD               pantoprazole  (PROTONIX ) 40 MG tablet 90 tablet 3    Sig: Take 1 tablet (40 mg total) by mouth daily.     There is no refill protocol information for this order

## 2023-06-26 ENCOUNTER — Ambulatory Visit: Payer: Self-pay | Admitting: Radiation Oncology

## 2023-06-28 ENCOUNTER — Ambulatory Visit: Payer: Self-pay | Admitting: Radiation Oncology

## 2023-07-03 ENCOUNTER — Telehealth: Payer: Self-pay

## 2023-07-03 ENCOUNTER — Encounter: Payer: Medicare HMO | Admitting: Nurse Practitioner

## 2023-07-03 ENCOUNTER — Ambulatory Visit: Payer: Medicare HMO | Admitting: Hematology and Oncology

## 2023-07-03 NOTE — Telephone Encounter (Signed)
 Received phone call from patient stating he missed a call from [phone].  Informed patient of his appt today with palliative care at 2 pm.  He voiced understanding.

## 2023-07-04 ENCOUNTER — Other Ambulatory Visit: Payer: Self-pay | Admitting: Family Medicine

## 2023-07-05 ENCOUNTER — Other Ambulatory Visit: Payer: Self-pay | Admitting: Family Medicine

## 2023-07-05 NOTE — Telephone Encounter (Signed)
 Requested medication (s) are due for refill today: yes  Requested medication (s) are on the active medication list: no  Last refill:  04/23/23 #30  Future visit scheduled: yes  Notes to clinic:  Cr out of range   Requested Prescriptions  Pending Prescriptions Disp Refills   diltiazem  (CARDIZEM  LA) 180 MG 24 hr tablet [Pharmacy Med Name: DILTIAZEM  24H ER(LA) 180 MG TB] 30 tablet     Sig: TAKE 1 TABLET BY MOUTH ONCE DAILY FOR HEART     Cardiovascular: Calcium  Channel Blockers 3 Failed - 07/05/2023  4:22 PM      Failed - Cr in normal range and within 360 days    Creat  Date Value Ref Range Status  04/22/2023 1.90 (H) 0.70 - 1.35 mg/dL Final   Creatinine, Urine  Date Value Ref Range Status  12/12/2009 226.8 mg/dL Final         Passed - ALT in normal range and within 360 days    ALT  Date Value Ref Range Status  04/22/2023 12 9 - 46 U/L Final  03/20/2021 13 0 - 44 U/L Final         Passed - AST in normal range and within 360 days    AST  Date Value Ref Range Status  04/22/2023 19 10 - 35 U/L Final  03/20/2021 14 (L) 15 - 41 U/L Final         Passed - Last BP in normal range    BP Readings from Last 1 Encounters:  05/13/23 130/62         Passed - Last Heart Rate in normal range    Pulse Readings from Last 1 Encounters:  05/13/23 88         Passed - Valid encounter within last 6 months    Recent Outpatient Visits           1 month ago Atrial fibrillation, unspecified type [facility])   [place] [facility] Austine Lefort, MD   1 month ago Atrial fibrillation, unspecified type [facility])   [place] [facility] Austine Lefort, MD   2 months ago Atrial fibrillation, unspecified type [facility])   [place] [facility] Austine Lefort, MD   8 months ago Stage 3b chronic kidney disease [facility])   [place] [facility] Austine Lefort, MD   8 months ago Essential hypertension, benign   [place]  [facility] Pickard, Cisco Crest, MD

## 2023-07-09 ENCOUNTER — Other Ambulatory Visit: Payer: Self-pay | Admitting: Family Medicine

## 2023-07-09 MED ORDER — DILTIAZEM HCL ER COATED BEADS 180 MG PO CP24: 180.0000 mg | ORAL_CAPSULE | Freq: Every day | ORAL | 3 refills | Status: DC

## 2023-07-09 NOTE — Telephone Encounter (Signed)
 Requested medication (s) are due for refill today -no  Requested medication (s) are on the active medication list -yes  Future visit scheduled -no  Last refill: 07/05/23  Notes to clinic:   Pharmacy comment: Alternative Requested:TABS ARE NOT COVERED BY HIS INSURANCE.    Requested Prescriptions  Pending Prescriptions Disp Refills   diltiazem  (CARDIZEM  LA) 180 MG 24 hr tablet [Pharmacy Med Name: DILTIAZEM  24H ER(LA) 180 MG TB] 90 tablet 2    Sig: TAKE 1 TABLET BY MOUTH ONCE DAILY FOR HEART     Cardiovascular: Calcium  Channel Blockers 3 Failed - 07/09/2023  8:34 AM      Failed - Cr in normal range and within 360 days    Creat  Date Value Ref Range Status  04/22/2023 1.90 (H) 0.70 - 1.35 mg/dL Final   Creatinine, Urine  Date Value Ref Range Status  12/12/2009 226.8 mg/dL Final         Passed - ALT in normal range and within 360 days    ALT  Date Value Ref Range Status  04/22/2023 12 9 - 46 U/L Final  03/20/2021 13 0 - 44 U/L Final         Passed - AST in normal range and within 360 days    AST  Date Value Ref Range Status  04/22/2023 19 10 - 35 U/L Final  03/20/2021 14 (L) 15 - 41 U/L Final         Passed - Last BP in normal range    BP Readings from Last 1 Encounters:  05/13/23 130/62         Passed - Last Heart Rate in normal range    Pulse Readings from Last 1 Encounters:  05/13/23 88         Passed - Valid encounter within last 6 months    Recent Outpatient Visits           1 month ago Atrial fibrillation, unspecified type [facility])   [place] [facility] Austine Lefort, MD   2 months ago Atrial fibrillation, unspecified type [facility])   [place] [facility] Austine Lefort, MD   2 months ago Atrial fibrillation, unspecified type [facility])   [place] [facility] Austine Lefort, MD   8 months ago Stage 3b chronic kidney disease [facility])   [place] [facility] Austine Lefort, MD    8 months ago Essential hypertension, benign   [place] [facility] Pickard, Cisco Crest, MD                 Requested Prescriptions  Pending Prescriptions Disp Refills   diltiazem  (CARDIZEM  LA) 180 MG 24 hr tablet [Pharmacy Med Name: DILTIAZEM  24H ER(LA) 180 MG TB] 90 tablet 2    Sig: TAKE 1 TABLET BY MOUTH ONCE DAILY FOR HEART     Cardiovascular: Calcium  Channel Blockers 3 Failed - 07/09/2023  8:34 AM      Failed - Cr in normal range and within 360 days    Creat  Date Value Ref Range Status  04/22/2023 1.90 (H) 0.70 - 1.35 mg/dL Final   Creatinine, Urine  Date Value Ref Range Status  12/12/2009 226.8 mg/dL Final         Passed - ALT in normal range and within 360 days    ALT  Date Value Ref Range Status  04/22/2023 12 9 - 46 U/L Final  03/20/2021 13 0 -  44 U/L Final         Passed - AST in normal range and within 360 days    AST  Date Value Ref Range Status  04/22/2023 19 10 - 35 U/L Final  03/20/2021 14 (L) 15 - 41 U/L Final         Passed - Last BP in normal range    BP Readings from Last 1 Encounters:  05/13/23 130/62         Passed - Last Heart Rate in normal range    Pulse Readings from Last 1 Encounters:  05/13/23 88         Passed - Valid encounter within last 6 months    Recent Outpatient Visits           1 month ago Atrial fibrillation, unspecified type [facility])   [place] [facility] Austine Lefort, MD   2 months ago Atrial fibrillation, unspecified type [facility])   [place] [facility] Austine Lefort, MD   2 months ago Atrial fibrillation, unspecified type [facility])   [place] [facility] Austine Lefort, MD   8 months ago Stage 3b chronic kidney disease [facility])   [place] [facility] Austine Lefort, MD   8 months ago Essential hypertension, benign   [place] [facility] Pickard, Cisco Crest, MD

## 2023-07-09 NOTE — Progress Notes (Signed)
Cardizem  

## 2023-07-12 ENCOUNTER — Telehealth: Payer: Self-pay

## 2023-07-12 NOTE — Telephone Encounter (Signed)
 Left appt. Reminder Message

## 2023-07-16 ENCOUNTER — Ambulatory Visit: Admitting: Hematology and Oncology

## 2023-07-16 ENCOUNTER — Encounter: Admitting: Nurse Practitioner

## 2023-07-22 ENCOUNTER — Ambulatory Visit (INDEPENDENT_AMBULATORY_CARE_PROVIDER_SITE_OTHER): Admitting: Family Medicine

## 2023-07-22 ENCOUNTER — Telehealth: Payer: Self-pay

## 2023-07-22 VITALS — BP 164/92 | HR 140 | Ht 69.0 in | Wt 156.4 lb

## 2023-07-22 DIAGNOSIS — K922 Gastrointestinal hemorrhage, unspecified: Secondary | ICD-10-CM

## 2023-07-22 DIAGNOSIS — I4819 Other persistent atrial fibrillation: Secondary | ICD-10-CM | POA: Diagnosis not present

## 2023-07-22 DIAGNOSIS — N1832 Chronic kidney disease, stage 3b: Secondary | ICD-10-CM | POA: Diagnosis not present

## 2023-07-22 DIAGNOSIS — I10 Essential (primary) hypertension: Secondary | ICD-10-CM | POA: Diagnosis not present

## 2023-07-22 DIAGNOSIS — E119 Type 2 diabetes mellitus without complications: Secondary | ICD-10-CM | POA: Diagnosis not present

## 2023-07-22 DIAGNOSIS — E1169 Type 2 diabetes mellitus with other specified complication: Secondary | ICD-10-CM

## 2023-07-22 DIAGNOSIS — C771 Secondary and unspecified malignant neoplasm of intrathoracic lymph nodes: Secondary | ICD-10-CM | POA: Diagnosis not present

## 2023-07-22 DIAGNOSIS — I5020 Unspecified systolic (congestive) heart failure: Secondary | ICD-10-CM

## 2023-07-22 DIAGNOSIS — C4492 Squamous cell carcinoma of skin, unspecified: Secondary | ICD-10-CM

## 2023-07-22 MED ORDER — DILTIAZEM HCL ER COATED BEADS 180 MG PO CP24: 180.0000 mg | ORAL_CAPSULE | Freq: Every day | ORAL | 3 refills | Status: DC

## 2023-07-22 MED ORDER — METOPROLOL SUCCINATE ER 100 MG PO TB24
100.0000 mg | ORAL_TABLET | Freq: Every day | ORAL | 3 refills | Status: DC
Start: 2023-07-22 — End: 2023-08-26

## 2023-07-22 MED ORDER — FLUTICASONE PROPIONATE 50 MCG/ACT NA SUSP: 2.0000 | Freq: Every day | NASAL | 6 refills | Status: DC

## 2023-07-22 NOTE — Telephone Encounter (Signed)
 Spoke with Brian [name] at [facility] and Cardizem  is ready for pick up. Brian [name] states the insurance will not cover the Metoprolol  because the refill is too early. Pt picked up the Metoprolol  100 mg 90 day supply on 05/13/2023.  Pt advised and states that he doesn't have any and didn't pick up a 90 day supply.

## 2023-07-22 NOTE — Progress Notes (Signed)
 Subjective:    Patient ID: Brian [name], male    DOB: 10-20-1955, 68 y.o.   MRN: [id number]  05/10/23 Patient is a 68 year old Caucasian gentleman with a history of stage IV metastatic squamous cell carcinoma.  Originally was diagnosed at the base of his tongue.  CT scan last year revealed recurrence in the lung.  Currently being treated through radiation oncology.  Was admitted to the hospital in January with upper GI bleed due to gastritis complicated by profound bradycardia.  Also has a history of seizure disorder, stage III chronic kidney disease, type 2 diabetes mellitus, atrial fibrillation, and congestive heart failure.  07/22/23 Patient is scheduled today for a complete physical exam.  However he is tearful.  Reports feeling extremely weak and tired.  He states that he has no energy.  Vital signs are significant for rapidly irregular heartbeat.  EKG was performed showing a wide qrs irregular rhythm tachycardia which appears to be atrial fibrillation with rapid ventricular response.  Patient has a known history of a right bundle branch block likely explain the wide QRS interval.  Heart rate is 155 bpm.  Upon further evaluation, the patient states that he has not been taking his metoprolol  or his Cardizem  for over a month.  He states that he ran out of the medication a month ago.  He is taking Keppra  and Eliquis .  I suspect that his lack of energy and fatigue is likely due to his severe tachycardia and uncontrolled atrial fibrillation.  With regards to his cancer, the patient has stage IV metastatic squamous cell carcinoma.  This was originally diagnosed at the base of the tongue however imaging last year revealed a mass on his biopsy.  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  Patient completed radiation therapy and was supposed to have follow-up imaging/chest CT in May.  However patient has no showed for several appointments with his oncologist  and I do not see any scheduled imaging or oncology visits pending.  Therefore I spent the majority of today's encounter dealing with these 2 issues.  First we need to get control of his heart rate.  Second we need to ensure follow-up for his stage IV cancer. 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 Augustus Ledger, laser therapy OD   Seizures [facility])    pt reports this is from low blood sugar   Past Surgical History:  Procedure Laterality Date   BIOPSY  03/01/2023   Procedure: BIOPSY;  Surgeon: Sergio Dandy, MD;  Location: [facility] ENDOSCOPY;  Service: Gastroenterology;;   BRONCHIAL NEEDLE ASPIRATION BIOPSY  10/30/2022   Procedure: BRONCHIAL NEEDLE ASPIRATION BIOPSIES;  Surgeon: Prudy Brownie, DO;  Location: [facility] ENDOSCOPY;  Service: Cardiopulmonary;;   COLONOSCOPY     DIRECT LARYNGOSCOPY N/A 02/10/2020   Procedure: DIRECT LARYNGOSCOPY WITH BIOPSY;  Surgeon: Reynold Caves, MD;  Location: [facility] OR;  Service: ENT;  Laterality: N/A;   Ear surgery as a child     ESOPHAGOGASTRODUODENOSCOPY N/A 03/01/2023   Procedure: ESOPHAGOGASTRODUODENOSCOPY (EGD);  Surgeon: Nandigam, Kavitha V, MD;  Location: [facility] ENDOSCOPY;  Service: Gastroenterology;  Laterality: N/A;   IR GASTROSTOMY TUBE MOD SED  03/23/2020   IR GASTROSTOMY TUBE REMOVAL  06/01/2020   IR IMAGING GUIDED PORT INSERTION  03/09/2020   IR REMOVAL TUN ACCESS W/ PORT W/O FL MOD SED  02/08/2021   TEMPORARY PACEMAKER N/A 02/26/2023   Procedure: TEMPORARY PACEMAKER;  Surgeon: Arleen Lacer, MD;  Location: [facility] INVASIVE CV LAB;  Service: Cardiovascular;  Laterality: N/A;   THORACENTESIS  10/30/2022   Procedure:  THORACENTESIS;  Surgeon: Prudy Brownie, DO;  Location: [facility] ENDOSCOPY;  Service: Cardiopulmonary;;  L pleural effusion   TONSILLECTOMY     VIDEO BRONCHOSCOPY Left 10/30/2022   Procedure: VIDEO BRONCHOSCOPY WITHOUT FLUORO;  Surgeon: Prudy Brownie, DO;  Location: [facility] ENDOSCOPY;  Service: Cardiopulmonary;  Laterality: Left;   VIDEO BRONCHOSCOPY WITH ENDOBRONCHIAL ULTRASOUND Bilateral 10/30/2022   Procedure: VIDEO BRONCHOSCOPY WITH ENDOBRONCHIAL ULTRASOUND;  Surgeon: Prudy Brownie, DO;  Location: [facility] ENDOSCOPY;  Service: Cardiopulmonary;  Laterality: Bilateral;   Social History   Socioeconomic History   Marital status: Divorced    Spouse name: Not on file   Number of children: 2   Years of education: 12   Highest education level: 12th grade  Occupational History   Occupation: Disabled  Tobacco Use   Smoking status: Never    Passive exposure: Never   Smokeless tobacco: Never   Tobacco comments:    Verified by son, Phu Record  Vaping Use   Vaping status: Never Used  Substance and Sexual Activity   Alcohol use: Yes    Alcohol/week: 0.0 standard drinks of alcohol    Comment: seldom   Drug use: Not Currently    Frequency: 7.0 times per week    Types: Marijuana    Comment: marijauna for arthritis, last 03-13-20   Sexual activity: Not Currently    Partners: Female  Other Topics Concern   Not on file  Social History Narrative   Two children.  5 grandchildren.     Social Drivers of Corporate investment banker Strain: Low Risk  (06/06/2023)   Overall Financial Resource Strain (CARDIA)    Difficulty of Paying Living Expenses: Not hard at all  Food Insecurity: No Food Insecurity (06/06/2023)   Hunger Vital Sign    Worried About Running Out of Food in the Last Year: Never true    Ran Out of Food in the Last Year: Never true  Transportation Needs: Unmet Transportation Needs (06/06/2023)   PRAPARE - Administrator, Civil Service (Medical): Yes    Lack of Transportation  (Non-Medical): Yes  Physical Activity: Inactive (06/06/2023)   Exercise Vital Sign    Days of Exercise per Week: 0 days    Minutes of Exercise per Session: 0 min  Stress: No Stress Concern Present (06/06/2023)   [employer] of Occupational Health - Occupational Stress Questionnaire    Feeling of Stress : Not at all  Social Connections: Socially Isolated (06/06/2023)   Social Connection and Isolation Panel [NHANES]    Frequency of Communication with Friends and Family: Three times a week    Frequency of Social Gatherings with Friends and Family: Once a week    Attends Religious Services: Never    Database administrator or Organizations: No    Attends Banker Meetings: Never    Marital Status: Divorced  Catering manager Violence: Not At Risk (06/06/2023)   Humiliation, Afraid, Rape, and Kick  questionnaire    Fear of Current or Ex-Partner: No    Emotionally Abused: No    Physically Abused: No    Sexually Abused: No   Current Outpatient Medications on File Prior to Visit  Medication Sig Dispense Refill   apixaban  (ELIQUIS ) 5 MG TABS tablet Take 1 tablet (5 mg total) by mouth 2 (two) times daily. 60 tablet 5   atorvastatin  (LIPITOR) 20 MG tablet Take 1 tablet (20 mg total) by mouth daily. (Patient taking differently: Take 20 mg by mouth at bedtime.) 90 tablet 3   Continuous Glucose Receiver (DEXCOM G7 RECEIVER) DEVI Use to continuously check blood sugar daily. 1 each 1   Continuous Glucose Sensor (DEXCOM G7 SENSOR) MISC Use to continuously monitor glucose daily. Change meter every 10 days. 3 each 5   levETIRAcetam  (KEPPRA ) 500 MG tablet Take 1 tablet (500 mg total) by mouth 2 (two) times daily. 180 tablet 3   pantoprazole  (PROTONIX ) 40 MG tablet Take 1 tablet (40 mg total) by mouth daily. 90 tablet 3   No current facility-administered medications on file prior to visit.   Allergies  Allergen Reactions   Codeine Other (See Comments)    Unknown reaction, severe headach    Metformin  And Related Diarrhea     Review of Systems  All other systems reviewed and are negative.      Objective:   Physical Exam Vitals reviewed.  Constitutional:      General: He is not in acute distress.    Appearance: Normal appearance. He is normal weight. He is not ill-appearing, toxic-appearing or diaphoretic.  Cardiovascular:     Rate and Rhythm: Tachycardia present. Rhythm irregular.     Heart sounds: Normal heart sounds. No murmur heard.    No friction rub. No gallop.  Pulmonary:     Effort: Pulmonary effort is normal.     Breath sounds: Normal breath sounds. No wheezing, rhonchi or rales.  Abdominal:     General: Abdomen is flat. Bowel sounds are normal.     Palpations: Abdomen is soft.  Musculoskeletal:     Right lower leg: No edema.     Left lower leg: No edema.  Neurological:     Mental Status: He is alert and oriented to person, place, and time. Mental status is at baseline.     Motor: Weakness present.  Psychiatric:        Attention and Perception: Attention normal.        Mood and Affect: Affect is tearful.    Assessment & Plan:  Upper GI bleed  Persistent atrial fibrillation (HCC) - Plan: metoprolol  succinate (TOPROL -XL) 100 MG 24 hr tablet, EKG 12-Lead, CBC with Differential/Platelet, Comprehensive metabolic panel with GFR, Hemoglobin A1c, TSH, Lipid panel  Essential hypertension, benign - Plan: metoprolol  succinate (TOPROL -XL) 100 MG 24 hr tablet, CBC with Differential/Platelet, Comprehensive metabolic panel with GFR, Hemoglobin A1c, TSH, Lipid panel  Systolic heart failure, unspecified HF chronicity (HCC) - Plan: metoprolol  succinate (TOPROL -XL) 100 MG 24 hr tablet, EKG 12-Lead, CBC with Differential/Platelet, Comprehensive metabolic panel with GFR, Hemoglobin A1c, TSH, Lipid panel  Type 2 diabetes mellitus with other specified complication, without long-term current use of insulin  (HCC)  Stage 3b chronic kidney disease (HCC)  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  Situation was complicated by his noncompliance with medication and his frequent no-shows with the specialist.  I immediately sent in refills for his metoprolol  as well as his Cardizem .  I will have my nurse contact the pharmacy and they have the medication and it is available for him to pick up immediately.  I instructed him to do so once.  The same situation happened in March and on the medication his heart rate was controlled.  Patient underwent pacemaker placement due to bradycardia and therefore I am not concerned about bradycardia.  My only concern is whether we are able to slow his heart rate down effectively with medication.  Second I need to arrange follow-up with his oncologist.  Patient needs a CT scan of the lung to see if the radiation treatment was effective and if there is any local recurrence.  I will arrange follow-up with his primary oncologist to direct therapy.  I explained importance for follow-up as some of his weakness could be due to his stage IV cancer especially if there has been progression.  Has history of GI bleed, I will check a CBC CMP a TSH and A1c and a lipid panel to determine if there are any other contributing factors for his fatigue.

## 2023-07-23 ENCOUNTER — Ambulatory Visit: Payer: Self-pay | Admitting: Family Medicine

## 2023-07-23 LAB — CBC WITH DIFFERENTIAL/PLATELET
Absolute Lymphocytes: 261 {cells}/uL — ABNORMAL LOW (ref 850–3900)
Absolute Monocytes: 744 {cells}/uL (ref 200–950)
Basophils Absolute: 7 {cells}/uL (ref 0–200)
Basophils Relative: 0.1 %
Eosinophils Absolute: 0 {cells}/uL — ABNORMAL LOW (ref 15–500)
Eosinophils Relative: 0 %
HCT: 42.5 % (ref 38.5–50.0)
Hemoglobin: 13.3 g/dL (ref 13.2–17.1)
MCH: 27.7 pg (ref 27.0–33.0)
MCHC: 31.3 g/dL — ABNORMAL LOW (ref 32.0–36.0)
MCV: 88.4 fL (ref 80.0–100.0)
MPV: 10 fL (ref 7.5–12.5)
Monocytes Relative: 11.1 %
Neutro Abs: 5688 {cells}/uL (ref 1500–7800)
Neutrophils Relative %: 84.9 %
Platelets: 236 10*3/uL (ref 140–400)
RBC: 4.81 10*6/uL (ref 4.20–5.80)
RDW: 14.3 % (ref 11.0–15.0)
Total Lymphocyte: 3.9 %
WBC: 6.7 10*3/uL (ref 3.8–10.8)

## 2023-07-23 LAB — LIPID PANEL
Cholesterol: 137 mg/dL (ref <200)
HDL: 44 mg/dL (ref >=40)
LDL Cholesterol (Calc): 76 mg/dL
Non-HDL Cholesterol (Calc): 93 mg/dL (ref <130)
Total CHOL/HDL Ratio: 3.1 (calc) (ref <5.0)
Triglycerides: 83 mg/dL (ref <150)

## 2023-07-23 LAB — COMPREHENSIVE METABOLIC PANEL WITH GFR
AG Ratio: 1.7 (calc) (ref 1.0–2.5)
ALT: 8 U/L — ABNORMAL LOW (ref 9–46)
AST: 13 U/L (ref 10–35)
Albumin: 4.6 g/dL (ref 3.6–5.1)
Alkaline phosphatase (APISO): 83 U/L (ref 35–144)
BUN/Creatinine Ratio: 13 (calc) (ref 6–22)
BUN: 21 mg/dL (ref 7–25)
CO2: 24 mmol/L (ref 20–32)
Calcium: 9.8 mg/dL (ref 8.6–10.3)
Chloride: 98 mmol/L (ref 98–110)
Creat: 1.59 mg/dL — ABNORMAL HIGH (ref 0.70–1.35)
Globulin: 2.7 g/dL (ref 1.9–3.7)
Glucose, Bld: 154 mg/dL — ABNORMAL HIGH (ref 65–99)
Potassium: 3.7 mmol/L (ref 3.5–5.3)
Sodium: 136 mmol/L (ref 135–146)
Total Bilirubin: 1.1 mg/dL (ref 0.2–1.2)
Total Protein: 7.3 g/dL (ref 6.1–8.1)
eGFR: 47 mL/min/{1.73_m2} — ABNORMAL LOW (ref >=60)

## 2023-07-23 LAB — HEMOGLOBIN A1C
Hgb A1c MFr Bld: 7.2 % — ABNORMAL HIGH (ref <5.7)
Mean Plasma Glucose: 160 mg/dL
eAG (mmol/L): 8.9 mmol/L

## 2023-07-23 LAB — TSH: TSH: 4.56 m[IU]/L — ABNORMAL HIGH (ref 0.40–4.50)

## 2023-08-01 ENCOUNTER — Telehealth: Payer: Self-pay | Admitting: Family Medicine

## 2023-08-01 NOTE — Telephone Encounter (Signed)
 Left vm asking patient to return my call about scheduling Diabetic retinal exam.

## 2023-08-13 ENCOUNTER — Telehealth: Payer: Self-pay

## 2023-08-13 NOTE — Telephone Encounter (Signed)
 Left  son a message to confirm appt for 6/25

## 2023-08-14 ENCOUNTER — Inpatient Hospital Stay: Attending: Hematology and Oncology | Admitting: Hematology and Oncology

## 2023-08-14 VITALS — BP 197/154 | HR 98 | Temp 98.7°F | Resp 17 | Wt 162.9 lb

## 2023-08-14 DIAGNOSIS — I10 Essential (primary) hypertension: Secondary | ICD-10-CM | POA: Insufficient documentation

## 2023-08-14 DIAGNOSIS — C7800 Secondary malignant neoplasm of unspecified lung: Secondary | ICD-10-CM | POA: Diagnosis not present

## 2023-08-14 DIAGNOSIS — C140 Malignant neoplasm of pharynx, unspecified: Secondary | ICD-10-CM | POA: Diagnosis not present

## 2023-08-14 DIAGNOSIS — Z7189 Other specified counseling: Secondary | ICD-10-CM

## 2023-08-14 DIAGNOSIS — R530 Neoplastic (malignant) related fatigue: Secondary | ICD-10-CM | POA: Diagnosis not present

## 2023-08-14 DIAGNOSIS — R1310 Dysphagia, unspecified: Secondary | ICD-10-CM | POA: Diagnosis not present

## 2023-08-14 DIAGNOSIS — C01 Malignant neoplasm of base of tongue: Secondary | ICD-10-CM | POA: Diagnosis not present

## 2023-08-14 NOTE — Progress Notes (Signed)
 REASON FOR VISIT:  Routine follow-up for history of head & neck cancer.  BRIEF ONCOLOGIC HISTORY:  Oncology History Overview Note  Brian [name] is a 68 y.o. male who presented two-week history of facial swelling in late November/early December 2021. Subsequently, the patient saw Dr. [name], who performed a direct laryngoscopy with biopsy on the date of 02/10/2020. Final pathology revealed: squamous cell carcinoma, P16 positive   He had the following imaging performed  1. Soft tissue neck CT scan performed on 01/28/2020 revealing right glossotonsillar malignancy with bulky bilateral nodal disease. Nodal masses sowed definite extracapsular spread. There was also noted to be incidental advanced cervical spine degeneration with cord compression.  2. PET scan performed on 02/26/2020 revealing a hypermetabolic lesion at the right base of the tongue and right tonsil, consistent with oropharyngeal carcinoma. Carcinoma appeared to be confined to the mucosal surface. There were also noted to bulky bilateral hypermetabolic level II metastatic lymph nodes and smaller inferior hypermetabolic level II metastatic lymph nodes. There was no evidence of metastatic disease in the chest, abdomen, or pelvis. The two small right middle lobe pulmonary nodules present on CT from 2012 were consistent with benign etiology.  Started radiation on 03/14/2020 Chemo delayed by a week due to GFR and infusion issues. Started chemotherapy on 03/21/2020 C2 delayed because of AKI Now switched to weekly carboplatin  given ongoing AKI C1 of weekly carboplatin  on 04/04/2020 C2 of weekly carboplatin  on 04/11/2020 C3 of weekly carboplatin  on 04/18/2020 C4 Weekly carboplatin  on 04/25/2020 Last planned cycle omitted.   Malignant neoplasm of base of tongue (HCC)  03/01/2020 Cancer Staging   Staging form: Pharynx - HPV-Mediated Oropharynx, AJCC 8th Edition - Clinical stage from 03/01/2020: Stage III (cT2, cN3, cM0, p16+) - Signed by  Izell Domino, MD on 03/02/2020   03/02/2020 Initial Diagnosis   Malignant neoplasm of base of tongue (HCC)   03/21/2020 - 03/28/2020 Chemotherapy         04/04/2020 - 04/25/2020 Chemotherapy   Completed chemotherapy on 04/25/2020 Patient is on Treatment Plan : HEAD/NECK Carboplatin  q7d      Primary cancer of left lower lobe of lung (HCC)  06/13/2021 Initial Diagnosis   Primary cancer of left lower lobe of lung (HCC)   07/25/2022 Imaging   CT chest and CT soft tissue which showed increased size of left infrahilar lymph node and newly enlarged mediastinal lymph nodes concerning for metastatic disease new near complete occlusion of proximal left lower lobe bronchus with evidence of endobronchial soft tissue likely due to aspiration although endobronchial lesion cannot be excluded.  Increased linear opacities of left lung base.  CT neck showed no evidence of recurrent disease.  He had endobronchial ultrasound and fine-needle aspiration of 4R lymph node which confirmed presence of squamous cell carcinoma.  There were atypical cells noted on left lower lobe mass as well as 4L lymph node.     09/10/2022 PET scan   Narrative & Impression  CLINICAL DATA:  Subsequent treatment strategy for lung nodule, lung cancer.   EXAM: NUCLEAR MEDICINE PET SKULL BASE TO THIGH   TECHNIQUE: 8.0 mCi F-18 FDG was injected intravenously. Full-ring PET imaging was performed from the skull base to thigh after the radiotracer. CT data was obtained and used for attenuation correction and anatomic localization.   Fasting blood glucose: 98 mg/dl   COMPARISON:  CT chest 07/25/2022 and PET 06/08/2021.   FINDINGS: Mediastinal blood pool activity: SUV max 2.2   Liver activity: SUV max NA  NECK:   No abnormal hypermetabolism.   Incidental CT findings:   None.   CHEST:   Hypermetabolic bilateral mediastinal adenopathy. Index low right paratracheal lymph node measures 1.8 cm (4/72), SUV max 14.2. Hypermetabolic  left hilar lymph node measures approximately 2.2 cm (4/82), SUV max 13.9. No additional abnormal hypermetabolism.   Incidental CT findings:   Atherosclerotic calcification of the aorta, aortic valve and coronary arteries. Heart is enlarged. Small pericardial effusion. Moderate left pleural effusion, increased in size from 07/25/2022, with collapse/consolidation in the left lower lobe. Respiratory motion degrades image quality. Trace right pleural fluid.   ABDOMEN/PELVIS:   No abnormal hypermetabolism.   Incidental CT findings:   Liver, gallbladder, adrenal glands, kidneys, spleen, pancreas, stomach and bowel are grossly unremarkable.   SKELETON:   No abnormal hypermetabolism.   Incidental CT findings:   Degenerative changes in the spine.   IMPRESSION: 1. Hypermetabolic mediastinal and left hilar adenopathy, consistent with disease recurrence. 2. Moderate left pleural effusion, increased in size from 07/25/2022, with collapse/consolidation in the left lower lobe, possibly postobstructive in etiology. 3. Small pericardial effusion. 4. Trace right pleural fluid. 5. Aortic atherosclerosis (ICD10-I70.0). Coronary artery calcification.        Pathology Results   LLL MASS, FINE NEEDLE ASPIRATION:  - Atypical cells present   LYMPH NODE, 4L, FINE NEEDLE ASPIRATION:  - Atypical cells present   LYMPH NODE, 4R, FINE NEEDLE ASPIRATION:    FINAL MICROSCOPIC DIAGNOSIS:  - Malignant cells present  - Squamous cell carcinoma  - See comment   PLEURAL FLUID, LEFT, THORACENTESIS:    FINAL MICROSCOPIC DIAGNOSIS:  - Reactive mesothelial cells present     INTERVAL HISTORY:   Patient is here for follow up. We are still waiting on his HPV testing. Apparently the block was sent to [facility] and just was received back. He denies any complaints at all. No cough, chest pain, SOB, hemoptysis. No change in bowel habits or urinary habits. No falls. He is able to maintain his  weight.   CURRENT MEDICATIONS:  Current Outpatient Medications on File Prior to Visit  Medication Sig Dispense Refill   apixaban  (ELIQUIS ) 5 MG TABS tablet Take 1 tablet (5 mg total) by mouth 2 (two) times daily. 60 tablet 5   atorvastatin  (LIPITOR) 20 MG tablet Take 1 tablet (20 mg total) by mouth daily. (Patient taking differently: Take 20 mg by mouth at bedtime.) 90 tablet 3   Continuous Glucose Receiver (DEXCOM G7 RECEIVER) DEVI Use to continuously check blood sugar daily. 1 each 1   Continuous Glucose Sensor (DEXCOM G7 SENSOR) MISC Use to continuously monitor glucose daily. Change meter every 10 days. 3 each 5   diltiazem  (CARDIZEM  CD) 180 MG 24 hr capsule Take 1 capsule (180 mg total) by mouth daily. 90 capsule 3   fluticasone  (FLONASE ) 50 MCG/ACT nasal spray Place 2 sprays into both nostrils daily. 16 g 6   levETIRAcetam  (KEPPRA ) 500 MG tablet Take 1 tablet (500 mg total) by mouth 2 (two) times daily. 180 tablet 3   metoprolol  succinate (TOPROL -XL) 100 MG 24 hr tablet Take 1 tablet (100 mg total) by mouth daily. Take with or immediately following a meal. 90 tablet 3   pantoprazole  (PROTONIX ) 40 MG tablet Take 1 tablet (40 mg total) by mouth daily. 90 tablet 3   No current facility-administered medications on file prior to visit.    ALLERGIES:  Allergies  Allergen Reactions   Codeine Other (See Comments)    Unknown reaction, severe headach  Metformin  And Related Diarrhea     PHYSICAL EXAM:  Vitals:   08/14/23 1339 08/14/23 1340  BP: (!) 202/171 (!) 197/154  Pulse: 98   Resp: 17   Temp: 98.7 F (37.1 C)   SpO2: 96%     Filed Weights   08/14/23 1339  Weight: 162 lb 14.4 oz (73.9 kg)     General: Well-nourished, well-appearing male in no acute distress.  Unaccompanied today.  HEENT: Head is atraumatic and normocephalic.  Pupils equal and reactive to light. Skin is thickened,there is scab on the left side of the neck Lymph: No preauricular, postauricular, cervical,  supraclavicular, adenopathy.   Cardiovascular: Normal rate and rhythm. Respiratory: Clear to auscultation bilaterally.  GI: Abdomen soft and round. Non-tender, non-distended. Bowel sounds normoactive.  GU: Deferred.   Neuro: Left sided weakness Psych: Normal mood and affect for situation. Extremities: No edema.  Skin: Warm and dry.   Wt Readings from Last 3 Encounters:  08/14/23 162 lb 14.4 oz (73.9 kg)  07/22/23 156 lb 6.4 oz (70.9 kg)  05/13/23 170 lb (77.1 kg)     LABORATORY DATA:  None at this visit.  DIAGNOSTIC IMAGING:  None at this visit.    ASSESSMENT & PLAN:  Brian [name] is a pleasant 68 y.o. male with history of squamous cell at the base of his tongue diagnosed in January 2022 status post concurrent chemoradiation now presents with metastatic disease to the lung.  Assessment and Plan Assessment & Plan        Total time spent: 20 min  *Total Encounter Time as defined by the Centers for Medicare and Medicaid Services includes, in addition to the face-to-face time of a patient visit (documented in the note above) non-face-to-face time: obtaining and reviewing outside history, ordering and reviewing medications, tests or procedures, care coordination (communications with other health care professionals or caregivers) and documentation in the medical record.

## 2023-08-14 NOTE — Progress Notes (Signed)
 REASON FOR VISIT:  Routine follow-up for history of head & neck cancer.  BRIEF ONCOLOGIC HISTORY:  Oncology History Overview Note  Brian [name] is a 68 y.o. male who presented two-week history of facial swelling in late November/early December 2021. Subsequently, the patient saw Dr. [name], who performed a direct laryngoscopy with biopsy on the date of 02/10/2020. Final pathology revealed: squamous cell carcinoma, P16 positive   He had the following imaging performed  1. Soft tissue neck CT scan performed on 01/28/2020 revealing right glossotonsillar malignancy with bulky bilateral nodal disease. Nodal masses sowed definite extracapsular spread. There was also noted to be incidental advanced cervical spine degeneration with cord compression.  2. PET scan performed on 02/26/2020 revealing a hypermetabolic lesion at the right base of the tongue and right tonsil, consistent with oropharyngeal carcinoma. Carcinoma appeared to be confined to the mucosal surface. There were also noted to bulky bilateral hypermetabolic level II metastatic lymph nodes and smaller inferior hypermetabolic level II metastatic lymph nodes. There was no evidence of metastatic disease in the chest, abdomen, or pelvis. The two small right middle lobe pulmonary nodules present on CT from 2012 were consistent with benign etiology.  Started radiation on 03/14/2020 Chemo delayed by a week due to GFR and infusion issues. Started chemotherapy on 03/21/2020 C2 delayed because of AKI Now switched to weekly carboplatin  given ongoing AKI C1 of weekly carboplatin  on 04/04/2020 C2 of weekly carboplatin  on 04/11/2020 C3 of weekly carboplatin  on 04/18/2020 C4 Weekly carboplatin  on 04/25/2020 Last planned cycle omitted.   Malignant neoplasm of base of tongue (HCC)  03/01/2020 Cancer Staging   Staging form: Pharynx - HPV-Mediated Oropharynx, AJCC 8th Edition - Clinical stage from 03/01/2020: Stage III (cT2, cN3, cM0, p16+) - Signed by  Izell Domino, MD on 03/02/2020   03/02/2020 Initial Diagnosis   Malignant neoplasm of base of tongue (HCC)   03/21/2020 - 03/28/2020 Chemotherapy         04/04/2020 - 04/25/2020 Chemotherapy   Completed chemotherapy on 04/25/2020 Patient is on Treatment Plan : HEAD/NECK Carboplatin  q7d      Primary cancer of left lower lobe of lung (HCC)  06/13/2021 Initial Diagnosis   Primary cancer of left lower lobe of lung (HCC)   07/25/2022 Imaging   CT chest and CT soft tissue which showed increased size of left infrahilar lymph node and newly enlarged mediastinal lymph nodes concerning for metastatic disease new near complete occlusion of proximal left lower lobe bronchus with evidence of endobronchial soft tissue likely due to aspiration although endobronchial lesion cannot be excluded.  Increased linear opacities of left lung base.  CT neck showed no evidence of recurrent disease.  He had endobronchial ultrasound and fine-needle aspiration of 4R lymph node which confirmed presence of squamous cell carcinoma.  There were atypical cells noted on left lower lobe mass as well as 4L lymph node.     09/10/2022 PET scan   Narrative & Impression  CLINICAL DATA:  Subsequent treatment strategy for lung nodule, lung cancer.   EXAM: NUCLEAR MEDICINE PET SKULL BASE TO THIGH   TECHNIQUE: 8.0 mCi F-18 FDG was injected intravenously. Full-ring PET imaging was performed from the skull base to thigh after the radiotracer. CT data was obtained and used for attenuation correction and anatomic localization.   Fasting blood glucose: 98 mg/dl   COMPARISON:  CT chest 07/25/2022 and PET 06/08/2021.   FINDINGS: Mediastinal blood pool activity: SUV max 2.2   Liver activity: SUV max NA  NECK:   No abnormal hypermetabolism.   Incidental CT findings:   None.   CHEST:   Hypermetabolic bilateral mediastinal adenopathy. Index low right paratracheal lymph node measures 1.8 cm (4/72), SUV max 14.2. Hypermetabolic  left hilar lymph node measures approximately 2.2 cm (4/82), SUV max 13.9. No additional abnormal hypermetabolism.   Incidental CT findings:   Atherosclerotic calcification of the aorta, aortic valve and coronary arteries. Heart is enlarged. Small pericardial effusion. Moderate left pleural effusion, increased in size from 07/25/2022, with collapse/consolidation in the left lower lobe. Respiratory motion degrades image quality. Trace right pleural fluid.   ABDOMEN/PELVIS:   No abnormal hypermetabolism.   Incidental CT findings:   Liver, gallbladder, adrenal glands, kidneys, spleen, pancreas, stomach and bowel are grossly unremarkable.   SKELETON:   No abnormal hypermetabolism.   Incidental CT findings:   Degenerative changes in the spine.   IMPRESSION: 1. Hypermetabolic mediastinal and left hilar adenopathy, consistent with disease recurrence. 2. Moderate left pleural effusion, increased in size from 07/25/2022, with collapse/consolidation in the left lower lobe, possibly postobstructive in etiology. 3. Small pericardial effusion. 4. Trace right pleural fluid. 5. Aortic atherosclerosis (ICD10-I70.0). Coronary artery calcification.        Pathology Results   LLL MASS, FINE NEEDLE ASPIRATION:  - Atypical cells present   LYMPH NODE, 4L, FINE NEEDLE ASPIRATION:  - Atypical cells present   LYMPH NODE, 4R, FINE NEEDLE ASPIRATION:    FINAL MICROSCOPIC DIAGNOSIS:  - Malignant cells present  - Squamous cell carcinoma  - See comment   PLEURAL FLUID, LEFT, THORACENTESIS:    FINAL MICROSCOPIC DIAGNOSIS:  - Reactive mesothelial cells present     INTERVAL HISTORY:   Discussed the use of AI scribe software for clinical note transcription with the patient, who gave verbal consent to proceed.  History of Present Illness Brian [name] is a 68 year old male with squamous cell cancer who presents with persistent runny nose and fatigue following radiation therapy.    He has been experiencing persistent fatigue and rhinorrhea since undergoing radiation therapy for squamous cell cancer, which metastasized from his throat to his lungs. He describes his nose as 'running like a track star'. He uses Flonase , two squirts in each nostril, which helps slow down the rhinorrhea but does not completely resolve it.  He missed a CT scan scheduled for May 9th and is uncertain about the details of the scan scheduling, mentioning not receiving a call or message about it.  He reports high blood pressure and takes all prescribed medications, although he did not bring them to the appointment. He believes the medications are intended to help his heart rate and blood pressure, but feels they are not effective as his blood pressure remains high.  He experiences dysphagia, which has been consistent since the radiation treatment, though he feels it might be worsening slightly. No significant weight loss since his last visit.  No shortness of breath, chest pain, cough, or changes in bowel movements. He urinates without difficulty, describing it as 'pee like a horse'. He also notices blood under his skin and easy bruising, which he attributes to having thin skin.  In terms of his social history, he moved to a new address on [address] in [place] after his mother's passing in December 2024. He now lives with a friend. 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    CURRENT MEDICATIONS:  Current  Outpatient Medications on File Prior to Visit  Medication Sig Dispense Refill   apixaban  (ELIQUIS ) 5 MG TABS tablet Take 1 tablet (5 mg total) by mouth 2 (two) times daily. 60 tablet 5   atorvastatin  (LIPITOR) 20 MG tablet Take 1 tablet (20 mg total) by mouth daily. (Patient taking differently: Take 20 mg by mouth at bedtime.) 90 tablet 3   Continuous Glucose Receiver (DEXCOM G7  RECEIVER) DEVI Use to continuously check blood sugar daily. 1 each 1   Continuous Glucose Sensor (DEXCOM G7 SENSOR) MISC Use to continuously monitor glucose daily. Change meter every 10 days. 3 each 5   diltiazem  (CARDIZEM  CD) 180 MG 24 hr capsule Take 1 capsule (180 mg total) by mouth daily. 90 capsule 3   fluticasone  (FLONASE ) 50 MCG/ACT nasal spray Place 2 sprays into both nostrils daily. 16 g 6   levETIRAcetam  (KEPPRA ) 500 MG tablet Take 1 tablet (500 mg total) by mouth 2 (two) times daily. 180 tablet 3   metoprolol  succinate (TOPROL -XL) 100 MG 24 hr tablet Take 1 tablet (100 mg total) by mouth daily. Take with or immediately following a meal. 90 tablet 3   pantoprazole  (PROTONIX ) 40 MG tablet Take 1 tablet (40 mg total) by mouth daily. 90 tablet 3   No current facility-administered medications on file prior to visit.    ALLERGIES:  Allergies  Allergen Reactions   Codeine Other (See Comments)    Unknown reaction, severe headach   Metformin  And Related Diarrhea     PHYSICAL EXAM:  Vitals:   08/14/23 1339 08/14/23 1340  BP: (!) 202/171 (!) 197/154  Pulse: 98   Resp: 17   Temp: 98.7 F (37.1 C)   SpO2: 96%     Filed Weights   08/14/23 1339  Weight: 162 lb 14.4 oz (73.9 kg)     General: Well-nourished, well-appearing male in no acute distress.  Unaccompanied today.  Lymph: No preauricular, postauricular, cervical, supraclavicular, adenopathy.   Cardiovascular: Normal rate and rhythm. Respiratory: Clear to auscultation bilaterally.  GI: Abdomen soft and round. Non-tender, non-distended. Bowel sounds normoactive.  Neuro: Left sided weakness Psych: Normal mood and affect for situation. Extremities: No edema.  Skin: Warm and dry.   Wt Readings from Last 3 Encounters:  08/14/23 162 lb 14.4 oz (73.9 kg)  07/22/23 156 lb 6.4 oz (70.9 kg)  05/13/23 170 lb (77.1 kg)     LABORATORY DATA:  None at this visit.  DIAGNOSTIC IMAGING:  None at this visit.    ASSESSMENT &  PLAN:  Mr. Lax is a pleasant 68 y.o. male with history of squamous cell at the base of his tongue diagnosed in January 2022 status post concurrent chemoradiation now presents with recurrent cancer, metastatic to lungs s.p radiation alone.  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 Previous chemotherapy poorly tolerated, immunotherapy not preferred option, his TPS less than 1 % . Further chemotherapy unlikely to benefit, may reduce quality of life. Emphasized imaging importance for cancer status assessment. Focus on symptom management if progression noted. - Order CT scan to evaluate lung metastasis. - Coordinate with Delon for CT scheduling. - Discuss symptom management if progression noted.  Dysphagia Dysphagia persists post-radiation, slightly worsening. Ok to monitor, he is able to eat and maintain weight.  Hypertension Significantly elevated blood pressure, risk for stroke. Unclear medication adherence, possible ineffectiveness. Coordination with primary care needed for treatment adjustment. - Contact Dr. Duanne  to discuss antihypertensive regimen adjustments. - Instruct to monitor for chest pain or pressure, seek emergency care if occurs. - Bring current medications to next appointment for review.  Fatigue Persistent fatigue, likely multifactorial including cancer-related and treatment side effects.  Goals of Care Discussed limitations of further aggressive treatment due to frailty and poor chemotherapy tolerance. Focus on symptom management and quality of life if cancer progresses.  Follow-up Follow-up needed for cancer status and hypertension management. - Schedule follow-up after CT results. - Ensure communication with Delon for scheduling and reminders.   *Total Encounter Time as defined by the Centers for Medicare and Medicaid Services includes,  in addition to the face-to-face time of a patient visit (documented in the note above) non-face-to-face time: obtaining and reviewing outside history, ordering and reviewing medications, tests or procedures, care coordination (communications with other health care professionals or caregivers) and documentation in the medical record.

## 2023-08-15 NOTE — Progress Notes (Signed)
 Oncology Nurse Navigator Documentation   I was asked by Dr. Loretha to schedule CT neck/chest that has been ordered and schedule the patient with her after they are completed to got over the results. I called and left a VM with his son to discuss scheduling the CT scans and an appointment with Dr. Loretha. I left my direct contact information and asked that he call me back as soon as possible.   [name] RN, BSN, OCN Head & Neck Oncology Nurse Navigator [facility] at [facility] Phone # [phone]  Fax # [phone]

## 2023-08-19 ENCOUNTER — Other Ambulatory Visit: Payer: Self-pay

## 2023-08-19 ENCOUNTER — Telehealth: Payer: Self-pay

## 2023-08-19 MED ORDER — ATORVASTATIN CALCIUM 20 MG PO TABS: 20.0000 mg | ORAL_TABLET | Freq: Every day | ORAL | 0 refills | Status: DC

## 2023-08-19 NOTE — Telephone Encounter (Signed)
 Prescription Request  08/19/2023  LOV: 07/22/23 cpe  What is the name of the medication or equipment? atorvastatin  (LIPITOR) 20 MG tablet [[id number]]  Have you contacted your pharmacy to request a refill? Yes   Which pharmacy would you like this sent to?  [id number] [address], [place] - [address] [place] [address] Phone: [phone] Fax: [phone]    Patient notified that their request is being sent to the clinical staff for review and that they should receive a response within 2 business days.   Please advise at [facility] [phone]

## 2023-08-19 NOTE — Telephone Encounter (Signed)
 Sent in medication

## 2023-08-20 NOTE — Progress Notes (Signed)
 Oncology Nurse Navigator Documentation   I have left another voicemail with Brian [name] and his son Brian [name] asking for a return call to get his CT scans scheduled and a follow up appointment with Dr. Loretha to go over those scan results. I provided my direct contact information to both Brian [name] and his son asking for a return call as soon as possible.   Brian Jefferson RN, BSN, OCN Head & Neck Oncology Nurse Navigator [facility] at [facility] Phone # [phone]  Fax # [phone]

## 2023-08-26 ENCOUNTER — Ambulatory Visit (INDEPENDENT_AMBULATORY_CARE_PROVIDER_SITE_OTHER): Admitting: Family Medicine

## 2023-08-26 ENCOUNTER — Telehealth: Payer: Self-pay | Admitting: Hematology and Oncology

## 2023-08-26 VITALS — BP 140/82 | HR 113 | Temp 98.1°F | Ht 69.0 in | Wt 159.8 lb

## 2023-08-26 DIAGNOSIS — I4819 Other persistent atrial fibrillation: Secondary | ICD-10-CM | POA: Diagnosis not present

## 2023-08-26 DIAGNOSIS — I10 Essential (primary) hypertension: Secondary | ICD-10-CM | POA: Diagnosis not present

## 2023-08-26 MED ORDER — METOPROLOL SUCCINATE ER 200 MG PO TB24: 200.0000 mg | ORAL_TABLET | Freq: Every day | ORAL | 3 refills | Status: DC

## 2023-08-26 MED ORDER — AZELASTINE HCL 0.1 % NA SOLN: 2.0000 | Freq: Two times a day (BID) | NASAL | 12 refills | Status: DC

## 2023-08-26 NOTE — Telephone Encounter (Signed)
 Left patient a vm regarding upcoming appointment

## 2023-08-26 NOTE — Progress Notes (Signed)
 Subjective:    Patient ID: Brian [name], male    DOB: [birth year], 68 y.o.   MRN: [id number]  05/10/23 Patient is a 68 year old Caucasian gentleman with a history of stage IV metastatic squamous cell carcinoma.  Originally was diagnosed at the base of his tongue.  CT scan last year revealed recurrence in the lung.  Currently being treated through radiation oncology.  Was admitted to the hospital in January with upper GI bleed due to gastritis complicated by profound bradycardia.  Also has a history of seizure disorder, stage III chronic kidney disease, type 2 diabetes mellitus, atrial fibrillation, and congestive heart failure.  07/22/23 Patient is scheduled today for a complete physical exam.  However he is tearful.  Reports feeling extremely weak and tired.  He states that he has no energy.  Vital signs are significant for rapidly irregular heartbeat.  EKG was performed showing a wide qrs irregular rhythm tachycardia which appears to be atrial fibrillation with rapid ventricular response.  Patient has a known history of a right bundle branch block likely explain the wide QRS interval.  Heart rate is 155 bpm.  Upon further evaluation, the patient states that he has not been taking his metoprolol  or his Cardizem  for over a month.  He states that he ran out of the medication a month ago.  He is taking Keppra  and Eliquis .  I suspect that his lack of energy and fatigue is likely due to his severe tachycardia and uncontrolled atrial fibrillation.  With regards to his cancer, the patient has stage IV metastatic squamous cell carcinoma.  This was originally diagnosed at the base of the tongue however imaging last year revealed a mass on his biopsy.  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  Patient completed radiation therapy and was supposed to have follow-up imaging/chest CT in May.  However patient has no showed for several appointments with his oncologist  and I do not see any scheduled imaging or oncology visits pending.  Therefore I spent the majority of today's encounter dealing with these 2 issues.  First we need to get control of his heart rate.  Second we need to ensure follow-up for his stage IV cancer. 08/26/23 Patient recently saw his oncologist.  His blood pressure was extremely high at the office.  We have checked it here today and although slightly elevated it is much better than at the recent oncology visit.  However he still has tachycardia and is in atrial fibrillation.  His heart rate is between 110 and 120 on today's exam.  He states that he is consistently taking his diltiazem  180 mg a day.  He is also taking Toprol  100 mg a day.  He denies any chest pain or shortness of breath.  Recent lab work showed excellent cholesterol and a hemoglobin A1c indicating adequate control of his blood sugar. Past Medical History:  Diagnosis Date  . Arthritis   . Atrial fibrillation (HCC)   . Cancer (HCC) 2021   SCC base of tongue  . Cardiomyopathy   . Cerebrovascular accident [facility]) 2012   R MCA  . Diabetes mellitus   . Hyperlipidemia   . Hypertension   . Left-sided weakness   . Lung cancer, lower lobe (HCC) 05/2021  . Nausea and vomiting 03/01/2023  . 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 Alvia, laser therapy OD  . Seizures (HCC)    pt reports this is from low blood sugar  Past Surgical History:  Procedure Laterality Date  . BIOPSY  03/01/2023   Procedure: BIOPSY;  Surgeon: Shila Gustav GAILS, MD;  Location: [facility] ENDOSCOPY;  Service: Gastroenterology;;  . BRONCHIAL NEEDLE ASPIRATION BIOPSY  10/30/2022   Procedure: BRONCHIAL NEEDLE ASPIRATION BIOPSIES;  Surgeon: Brenna Adine CROME, DO;  Location: [facility] ENDOSCOPY;  Service: Cardiopulmonary;;  . COLONOSCOPY    . DIRECT LARYNGOSCOPY N/A 02/10/2020   Procedure: DIRECT LARYNGOSCOPY WITH BIOPSY;  Surgeon: [name] Clunes, MD;  Location: [facility] OR;  Service: ENT;  Laterality: N/A;  . Ear surgery as a  child    . ESOPHAGOGASTRODUODENOSCOPY N/A 03/01/2023   Procedure: ESOPHAGOGASTRODUODENOSCOPY (EGD);  Surgeon: Nandigam, Kavitha V, MD;  Location: [facility] ENDOSCOPY;  Service: Gastroenterology;  Laterality: N/A;  . IR GASTROSTOMY TUBE MOD SED  03/23/2020  . IR GASTROSTOMY TUBE REMOVAL  06/01/2020  . IR IMAGING GUIDED PORT INSERTION  03/09/2020  . IR REMOVAL TUN ACCESS W/ PORT W/O FL MOD SED  02/08/2021  . TEMPORARY PACEMAKER N/A 02/26/2023   Procedure: TEMPORARY PACEMAKER;  Surgeon: Anner Alm ORN, MD;  Location: [facility] INVASIVE CV LAB;  Service: Cardiovascular;  Laterality: N/A;  . THORACENTESIS  10/30/2022   Procedure: THORACENTESIS;  Surgeon: Brenna Adine CROME, DO;  Location: [facility] ENDOSCOPY;  Service: Cardiopulmonary;;  L pleural effusion  . TONSILLECTOMY    . VIDEO BRONCHOSCOPY Left 10/30/2022   Procedure: VIDEO BRONCHOSCOPY WITHOUT FLUORO;  Surgeon: Brenna Adine CROME, DO;  Location: [facility] ENDOSCOPY;  Service: Cardiopulmonary;  Laterality: Left;  SABRA VIDEO BRONCHOSCOPY WITH ENDOBRONCHIAL ULTRASOUND Bilateral 10/30/2022   Procedure: VIDEO BRONCHOSCOPY WITH ENDOBRONCHIAL ULTRASOUND;  Surgeon: Brenna Adine CROME, DO;  Location: [facility] ENDOSCOPY;  Service: Cardiopulmonary;  Laterality: Bilateral;   Social History   Socioeconomic History  . Marital status: Divorced    Spouse name: Not on file  . Number of children: 2  . Years of education: 63  . Highest education level: 12th grade  Occupational History  . Occupation: Disabled  Tobacco Use  . Smoking status: Never    Passive exposure: Never  . Smokeless tobacco: Never  . Tobacco comments:    Verified by son, Brian [name]  Vaping Use  . Vaping status: Never Used  Substance and Sexual Activity  . Alcohol use: Yes    Alcohol/week: 0.0 standard drinks of alcohol    Comment: seldom  . Drug use: Not Currently    Frequency: 7.0 times per week    Types: Marijuana    Comment: marijauna for arthritis, last 03-13-20  . Sexual activity: Not Currently    Partners:  Female  Other Topics Concern  . Not on file  Social History Narrative   Two children.  5 grandchildren.     Social Drivers of Health   Financial Resource Strain: Low Risk  (06/06/2023)   Overall Financial Resource Strain (CARDIA)   . Difficulty of Paying Living Expenses: Not hard at all  Food Insecurity: No Food Insecurity (06/06/2023)   Hunger Vital Sign   . Worried About Programme researcher, broadcasting/film/video in the Last Year: Never true   . Ran Out of Food in the Last Year: Never true  Transportation Needs: Unmet Transportation Needs (06/06/2023)   PRAPARE - Transportation   . Lack of Transportation (Medical): Yes   . Lack of Transportation (Non-Medical): Yes  Physical Activity: Inactive (06/06/2023)   Exercise Vital Sign   . Days of Exercise per Week: 0 days   . Minutes of Exercise per Session: 0 min  Stress: No Stress Concern Present (06/06/2023)  [employer] of Occupational Health - Occupational Stress Questionnaire   . Feeling of Stress : Not at all  Social Connections: Socially Isolated (06/06/2023)   Social Connection and Isolation Panel   . Frequency of Communication with Friends and Family: Three times a week   . Frequency of Social Gatherings with Friends and Family: Once a week   . Attends Religious Services: Never   . Active Member of Clubs or Organizations: No   . Attends Banker Meetings: Never   . Marital Status: Divorced  Catering manager Violence: Not At Risk (06/06/2023)   Humiliation, Afraid, Rape, and Kick questionnaire   . Fear of Current or Ex-Partner: No   . Emotionally Abused: No   . Physically Abused: No   . Sexually Abused: No   Current Outpatient Medications on File Prior to Visit  Medication Sig Dispense Refill  . apixaban  (ELIQUIS ) 5 MG TABS tablet Take 1 tablet (5 mg total) by mouth 2 (two) times daily. 60 tablet 5  . atorvastatin  (LIPITOR) 20 MG tablet Take 1 tablet (20 mg total) by mouth at bedtime. 90 tablet 0  . Continuous Glucose Receiver  (DEXCOM G7 RECEIVER) DEVI Use to continuously check blood sugar daily. 1 each 1  . Continuous Glucose Sensor (DEXCOM G7 SENSOR) MISC Use to continuously monitor glucose daily. Change meter every 10 days. 3 each 5  . diltiazem  (CARDIZEM  CD) 180 MG 24 hr capsule Take 1 capsule (180 mg total) by mouth daily. 90 capsule 3  . empagliflozin  (JARDIANCE ) 25 MG TABS tablet 25 mg.    . levETIRAcetam  (KEPPRA ) 500 MG tablet Take 1 tablet (500 mg total) by mouth 2 (two) times daily. 180 tablet 3  . pantoprazole  (PROTONIX ) 40 MG tablet Take 1 tablet (40 mg total) by mouth daily. 90 tablet 3  . fluticasone  (FLONASE ) 50 MCG/ACT nasal spray Place 2 sprays into both nostrils daily. (Patient not taking: Reported on 08/26/2023) 16 g 6   No current facility-administered medications on file prior to visit.   Allergies  Allergen Reactions  . Codeine Other (See Comments)    Unknown reaction, severe headach  . Metformin  And Related Diarrhea     Review of Systems  All other systems reviewed and are negative.      Objective:   Physical Exam Vitals reviewed.  Constitutional:      General: He is not in acute distress.    Appearance: Normal appearance. He is normal weight. He is not ill-appearing, toxic-appearing or diaphoretic.  Cardiovascular:     Rate and Rhythm: Tachycardia present. Rhythm irregular.     Heart sounds: Normal heart sounds. No murmur heard.    No friction rub. No gallop.  Pulmonary:     Effort: Pulmonary effort is normal.     Breath sounds: Normal breath sounds. No wheezing, rhonchi or rales.  Abdominal:     General: Abdomen is flat. Bowel sounds are normal.     Palpations: Abdomen is soft.  Musculoskeletal:     Right lower leg: No edema.     Left lower leg: No edema.  Neurological:     Mental Status: He is alert and oriented to person, place, and time. Mental status is at baseline.     Motor: Weakness present.  Psychiatric:        Attention and Perception: Attention normal.     Assessment & Plan:  Persistent atrial fibrillation (HCC)  Essential hypertension, benign Blood pressure is mildly elevated but heart rate remains out of  control.  Continue diltiazem  180 mg daily.  Increase Toprol -XL to 200 mg daily and recheck blood pressure and heart rate next week.

## 2023-08-27 NOTE — Progress Notes (Signed)
 Oncology Nurse Navigator Documentation   I left a VM with Brian [name] informing him of his upcoming appointment for a CT scan and follow up with Dr. Loretha. In the VM I informed him that his scan is on 09/02/23 with an appointment with Dr. Loretha on 09/12/23 to receive results. I also left a VM with his son with the scan and follow up appointment information. I left my direct contact information and asked for a return call back as soon as possible.   [name] RN, BSN, OCN Head & Neck Oncology Nurse Navigator [facility] at [facility] Phone # [phone]  Fax # [phone]

## 2023-08-28 NOTE — Progress Notes (Signed)
 Oncology Nurse Navigator Documentation   I received a call back from [name]. I made him aware of his appointment for his CT scan on Monday 7/14 at [facility] hospital. He voiced his understanding and plans to be there for the appointment as scheduled. He knows to call me if he has any questions.   [name] RN, BSN, OCN Head & Neck Oncology Nurse Navigator [facility] at [facility] Phone # [phone]  Fax # [phone]

## 2023-09-02 ENCOUNTER — Ambulatory Visit (HOSPITAL_COMMUNITY)
Admission: RE | Admit: 2023-09-02 | Discharge: 2023-09-02 | Disposition: A | Discharge status: Home / Self Care | Source: Ambulatory Visit | Attending: Radiation Oncology | Admitting: Radiation Oncology

## 2023-09-02 ENCOUNTER — Encounter (INDEPENDENT_AMBULATORY_CARE_PROVIDER_SITE_OTHER): Admitting: Family Medicine

## 2023-09-02 DIAGNOSIS — R609 Edema, unspecified: Secondary | ICD-10-CM | POA: Diagnosis not present

## 2023-09-02 DIAGNOSIS — M47812 Spondylosis without myelopathy or radiculopathy, cervical region: Secondary | ICD-10-CM | POA: Diagnosis not present

## 2023-09-02 DIAGNOSIS — C01 Malignant neoplasm of base of tongue: Secondary | ICD-10-CM | POA: Insufficient documentation

## 2023-09-02 DIAGNOSIS — I6523 Occlusion and stenosis of bilateral carotid arteries: Secondary | ICD-10-CM | POA: Diagnosis not present

## 2023-09-02 DIAGNOSIS — Z8589 Personal history of malignant neoplasm of other organs and systems: Secondary | ICD-10-CM | POA: Diagnosis not present

## 2023-09-02 DIAGNOSIS — C760 Malignant neoplasm of head, face and neck: Secondary | ICD-10-CM | POA: Diagnosis not present

## 2023-09-02 DIAGNOSIS — R911 Solitary pulmonary nodule: Secondary | ICD-10-CM | POA: Diagnosis not present

## 2023-09-02 DIAGNOSIS — C3432 Malignant neoplasm of lower lobe, left bronchus or lung: Secondary | ICD-10-CM | POA: Diagnosis not present

## 2023-09-02 DIAGNOSIS — J90 Pleural effusion, not elsewhere classified: Secondary | ICD-10-CM | POA: Diagnosis not present

## 2023-09-02 DIAGNOSIS — R590 Localized enlarged lymph nodes: Secondary | ICD-10-CM | POA: Diagnosis not present

## 2023-09-06 NOTE — Progress Notes (Signed)
 To provide context for the patient's other providers, here are snapshots of his previous CHEST radiation fields  2023 SBRT 06/21/2021 - 07/06/2021: The left lower lung mass 60 Gy in 5 fractions     ------------------------------------------------------------------------------  First Treatment Date: 2022-12-19 - Last Treatment Date: 2023-01-02 Site: Thorax, intrathoracic lymph nodes Prescribed Dose (Delivered / Prescribed): 30 Gy / 30 Gy Prescribed Fxs (Delivered / Prescribed): 10 / 10

## 2023-09-09 ENCOUNTER — Ambulatory Visit: Admitting: Gastroenterology

## 2023-09-09 NOTE — Progress Notes (Signed)
 This encounter was created in error - please disregard.

## 2023-09-09 NOTE — Progress Notes (Deleted)
 Brian [name] [id number] [birth year]   Chief Complaint: Discuss colonoscopy  Referring Provider: Duanne Butler DASEN, MD Primary GI MD: Dr. Shila  HPI: Brian [name] is a 68 y.o. male with past medical history of atrial fibrillation on Eliquis , stage IV metastatic squamous cell carcinoma originally diagnosed at the base of the tongue with mets to the lung, lung cancer 2023, CVA 2012, T2DM, HLD, HTN, chronic anemia, CKD, reported seizures due to low blood sugar who presents today to discuss colonoscopy.    Patient last seen in office 01/23/2021 by Elida Shawl, NP for abnormal findings of the cecum on PET scan 08/25/2020.  Follow-up PET scan 01/17/2021 did not show any abnormal activity in the abdomen or pelvis and liver was unremarkable.  Follows with oncology, last visit 08/14/2023.  Patient admitted to the hospital 02/26/2023 to 03/07/2023 for symptomatic bradycardia/complete heart block.  On presentation he was bradycardic and hypotensive, rhythm was in a flutter with rate in the 40s.  He was started on vasopressors and admitted to ICU.  Cardiology consulted and transvenous pacemaker placed.  Hospital course also remarkable for coffee-ground emesis, anemia, GI was consulted and EGD done on 1/10 showed mild gastritis, duodenitis.   Previous GI Procedures/Imaging   EGD 03/01/2023 - Z- line regular, 40 cm from the incisors.  - No gross lesions in the entire esophagus.  - 3 cm hiatal hernia.  - Gastritis. Biopsied. No gastric outlet obstruction.  - No gross lesions in the first portion of the duodenum and in the second portion of the duodenum. Path: A. STOMACH, RANDOM, BIOPSY:  - Gastric antral and oxyntic mucosa with mild nonspecific reactive  gastropathy  - Helicobacter pylori-like organisms are not identified on routine HE  stain   Colonoscopy 08/12/2018 - One 3 mm polyp in the transverse colon, removed with a cold snare. Resected and retrieved.  - External and  internal hemorrhoids.  - The examination was otherwise normal on direct and retroflexion views. - Recall 5 years Path: Surgical [P], colon, transverse, polyp - HYPERPLASTIC POLYP(S). - THERE IS NO EVIDENCE OF MALIGNANCY.  Colonoscopy 04/27/2015 - One 15 mm polyp in the mid ascending colon, removed with a hot snare. Resected and retrieved.  - One 6 mm polyp in the sigmoid colon, removed with a cold snare. Resected and retrieved.  - The examination was otherwise normal. Path: Surgical [P], descending and ascending, polyp (2) -TUBULAR ADENOMAS. -NO HIGH GRADE DYSPLASIA OR MALIGNANCY IDENTIFIED.  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 Alvia, laser therapy OD   Seizures [facility])    pt reports this is from low blood sugar    Past Surgical History:  Procedure Laterality Date   BIOPSY  03/01/2023   Procedure: BIOPSY;  Surgeon: Shila Gustav GAILS, MD;  [facility] ENDOSCOPY;  Service: Gastroenterology;;   BRONCHIAL NEEDLE ASPIRATION BIOPSY  10/30/2022   Procedure: BRONCHIAL NEEDLE ASPIRATION BIOPSIES;  Surgeon: Brenna Adine LITTIE, DO;  Location: [facility] ENDOSCOPY;  Service: Cardiopulmonary;;   COLONOSCOPY     DIRECT LARYNGOSCOPY N/A 02/10/2020   Procedure:  DIRECT LARYNGOSCOPY WITH BIOPSY;  Surgeon: [name] Clunes, MD;  Location[facility] OR;  Service: ENT;  Laterality: N/A;   Ear surgery as a child     ESOPHAGOGASTRODUODENOSCOPY N/A 03/01/2023   Procedure: ESOPHAGOGASTRODUODENOSCOPY (EGD);  Surgeon: Nandigam, Kavitha V, MD;  Location: [facility] ENDOSCOPY;  Service: Gastroenterology;  Laterality: N/A;   IR GASTROSTOMY TUBE MOD SED  03/23/2020   IR GASTROSTOMY TUBE  REMOVAL  06/01/2020   IR IMAGING GUIDED PORT INSERTION  03/09/2020   IR REMOVAL TUN ACCESS W/ PORT W/O FL MOD SED  02/08/2021   TEMPORARY PACEMAKER N/A 02/26/2023   Procedure: TEMPORARY PACEMAKER;  Surgeon: Anner Alm ORN, MD;  Location: [facility] INVASIVE CV LAB;  Service: Cardiovascular;  Laterality: N/A;   THORACENTESIS  10/30/2022   Procedure: THORACENTESIS;  Surgeon: Brenna Adine CROME, DO;  Location: [facility] ENDOSCOPY;  Service: Cardiopulmonary;;  L pleural effusion   TONSILLECTOMY     VIDEO BRONCHOSCOPY Left 10/30/2022   Procedure: VIDEO BRONCHOSCOPY WITHOUT FLUORO;  Surgeon: Brenna Adine CROME, DO;  Location: [facility] ENDOSCOPY;  Service: Cardiopulmonary;  Laterality: Left;   VIDEO BRONCHOSCOPY WITH ENDOBRONCHIAL ULTRASOUND Bilateral 10/30/2022   Procedure: VIDEO BRONCHOSCOPY WITH ENDOBRONCHIAL ULTRASOUND;  Surgeon: Brenna Adine CROME, DO;  Location: [facility] ENDOSCOPY;  Service: Cardiopulmonary;  Laterality: Bilateral;    Current Outpatient Medications  Medication Sig Dispense Refill   apixaban  (ELIQUIS ) 5 MG TABS tablet Take 1 tablet (5 mg total) by mouth 2 (two) times daily. 60 tablet 5   atorvastatin  (LIPITOR) 20 MG tablet Take 1 tablet (20 mg total) by mouth at bedtime. 90 tablet 0   azelastine  (ASTELIN ) 0.1 % nasal spray Place 2 sprays into both nostrils 2 (two) times daily. Use in each nostril as directed 30 mL 12   Continuous Glucose Receiver (DEXCOM G7 RECEIVER) DEVI Use to continuously check blood sugar daily. 1 each 1   Continuous Glucose Sensor (DEXCOM G7 SENSOR) MISC Use to continuously monitor glucose daily. Change meter every 10 days. 3 each 5   diltiazem  (CARDIZEM  CD) 180 MG 24 hr capsule Take 1 capsule (180 mg total) by mouth daily. 90 capsule 3   empagliflozin  (JARDIANCE ) 25 MG TABS tablet 25 mg.     fluticasone  (FLONASE ) 50 MCG/ACT nasal spray Place 2 sprays into both nostrils daily. (Patient not taking: Reported on 08/26/2023) 16 g 6   levETIRAcetam  (KEPPRA ) 500 MG tablet Take 1 tablet (500 mg  total) by mouth 2 (two) times daily. 180 tablet 3   metoprolol  (TOPROL -XL) 200 MG 24 hr tablet Take 1 tablet (200 mg total) by mouth daily. 90 tablet 3   pantoprazole  (PROTONIX ) 40 MG tablet Take 1 tablet (40 mg total) by mouth daily. 90 tablet 3   No current facility-administered medications for this visit.    Allergies as of 09/09/2023 - Review Complete 08/26/2023  Allergen Reaction Noted   Codeine Other (See Comments)    Metformin  and related Diarrhea 01/31/2021    Family History  Problem Relation Age of Onset   Colon cancer Mother    Hyperlipidemia Father    Hypertension Father    Breast cancer Sister    Coronary artery disease Neg Hx        no early CAD.   Stomach cancer Neg Hx    Rectal cancer Neg Hx    Esophageal cancer Neg Hx     Social History   Tobacco Use   Smoking status: Never    Passive exposure: Never   Smokeless tobacco: Never   Tobacco comments:  Verified by son, Sylvan Sookdeo  Vaping Use   Vaping status: Never Used  Substance Use Topics   Alcohol use: Yes    Alcohol/week: 0.0 standard drinks of alcohol    Comment: seldom   Drug use: Not Currently    Frequency: 7.0 times per week    Types: Marijuana    Comment: marijauna for arthritis, last 03-13-20     Review of Systems:    Constitutional: No weight loss, fever, chills, weakness or fatigue Eyes: No change in vision Ears, Nose, Throat:  No change in hearing or congestion Skin: No rash or itching Cardiovascular: No chest pain, chest pressure or palpitations   Respiratory: No SOB or cough Gastrointestinal: See HPI and otherwise negative Genitourinary: No dysuria or change in urinary frequency Neurological: No headache, dizziness or syncope Musculoskeletal: No new muscle or joint pain Hematologic: No bleeding or bruising    Physical Exam:  Vital signs: There were no vitals taken for this visit.  Constitutional: NAD, Well developed, Well nourished, alert and cooperative Head:   Normocephalic and atraumatic.  Eyes: No scleral icterus. Conjunctiva pink. Mouth: No oral lesions. Respiratory: Respirations even and unlabored. Lungs clear to auscultation bilaterally.  No wheezes, crackles, or rhonchi.  Cardiovascular:  Regular rate and rhythm. No murmurs. No peripheral edema. Gastrointestinal:  Soft, nondistended, nontender. No rebound or guarding. Normal bowel sounds. No appreciable masses or hepatomegaly. Rectal:  Not performed.  Neurologic:  Alert and oriented x4;  grossly normal neurologically.  Skin:   Dry and intact without significant lesions or rashes. Psychiatric: Oriented to person, place and time. Demonstrates good judgement and reason without abnormal affect or behaviors.   RELEVANT LABS AND IMAGING: CBC    Component Value Date/Time   WBC 6.7 07/22/2023 1203   RBC 4.81 07/22/2023 1203   HGB 13.3 07/22/2023 1203   HGB 11.9 (L) 09/16/2020 1540   HCT 42.5 07/22/2023 1203   PLT 236 07/22/2023 1203   PLT 161 09/16/2020 1540   MCV 88.4 07/22/2023 1203   MCH 27.7 07/22/2023 1203   MCHC 31.3 (L) 07/22/2023 1203   RDW 14.3 07/22/2023 1203   LYMPHSABS 0.4 (L) 02/26/2023 1551   MONOABS 0.5 02/26/2023 1551   EOSABS 0 (L) 07/22/2023 1203   BASOSABS 7 07/22/2023 1203    CMP     Component Value Date/Time   NA 136 07/22/2023 1203   NA 139 09/01/2021 1014   K 3.7 07/22/2023 1203   CL 98 07/22/2023 1203   CO2 24 07/22/2023 1203   GLUCOSE 154 (H) 07/22/2023 1203   BUN 21 07/22/2023 1203   BUN 17 09/01/2021 1014   CREATININE 1.59 (H) 07/22/2023 1203   CALCIUM  9.8 07/22/2023 1203   PROT 7.3 07/22/2023 1203   ALBUMIN 2.7 (L) 03/03/2023 0218   AST 13 07/22/2023 1203   AST 14 (L) 03/20/2021 0909   ALT 8 (L) 07/22/2023 1203   ALT 13 03/20/2021 0909   ALKPHOS 60 02/26/2023 1551   BILITOT 1.1 07/22/2023 1203   BILITOT 0.3 03/20/2021 0909   GFRNONAA 43 (L) 03/07/2023 0226   GFRNONAA 45 (L) 01/01/2023 1020   GFRNONAA 61 11/30/2019 0915   GFRAA 70  11/30/2019 0915   Echocardiogram 02/27/2023 1. Cannot fully assess wall motion. Left ventricular ejection fraction, by estimation, is 60 to 65% . The left ventricle has normal function. There is moderate concentric left ventricular hypertrophy. Left ventricular diastolic parameters are indeterminate.  2. Right ventricular systolic function is mildly reduced. The right ventricular size  is mildly enlarged. Tricuspid regurgitation signal is inadequate for assessing PA pressure.  3. Left atrial size was moderately dilated.  4. Right atrial size was mildly dilated.  5. The mitral valve was not well visualized. No evidence of mitral valve regurgitation.  6. The aortic valve was not well visualized. Aortic valve regurgitation is not visualized.  7. The inferior vena cava is dilated in size with > 50% respiratory variability, suggesting right atrial pressure of 8 mmHg.  Assessment/Plan:       Camie Furbish, PA-C  Gastroenterology 09/09/2023, 8:16 AM  Patient Care Team: Duanne Butler DASEN, MD as PCP - General (Family Medicine) Lavona Agent, MD as PCP - Cardiology (Cardiology) [name], Delon CROME, RN as Oncology Nurse Navigator Izell Domino, MD as Consulting Physician (Radiation Oncology) Loretha Ash, MD as Consulting Physician (Hematology and Oncology) Daryle Heron CROME, RD as Dietitian (Nutrition) [name], Lupita NOVAK, [name] as Speech Language Pathologist (Speech Pathology) Lanis Carbon, Florina CROME, PT as Physical Therapist (Physical Therapy) Stacia Arlean BROCKS, [name] (Inactive) as Social Worker (General Practice) [name] Clunes, MD as Consulting Physician (Otolaryngology) Nicholaus Sherlean CROME, [facility] (Inactive) as Pharmacist (Pharmacist) Lavona Agent, MD as Consulting Physician (Cardiology)

## 2023-09-11 ENCOUNTER — Telehealth: Payer: Self-pay

## 2023-09-12 ENCOUNTER — Inpatient Hospital Stay: Attending: Hematology and Oncology | Admitting: Hematology and Oncology

## 2023-09-19 ENCOUNTER — Encounter: Payer: Self-pay | Admitting: Family Medicine

## 2023-09-19 ENCOUNTER — Ambulatory Visit: Admitting: Family Medicine

## 2023-09-19 VITALS — BP 152/96 | HR 87 | Temp 98.5°F | Ht 69.0 in | Wt 163.0 lb

## 2023-09-19 DIAGNOSIS — I4819 Other persistent atrial fibrillation: Secondary | ICD-10-CM | POA: Diagnosis not present

## 2023-09-19 DIAGNOSIS — G8114 Spastic hemiplegia affecting left nondominant side: Secondary | ICD-10-CM | POA: Diagnosis not present

## 2023-09-19 NOTE — Progress Notes (Signed)
 Subjective:    Patient ID: Brian [name], male    DOB: 09/20/1955, 68 y.o.   MRN: [id number]  05/10/23 Patient is a 68 year old Caucasian gentleman with a history of stage IV metastatic squamous cell carcinoma.  Originally was diagnosed at the base of his tongue.  CT scan last year revealed recurrence in the lung.  Currently being treated through radiation oncology.  Was admitted to the hospital in January with upper GI bleed due to gastritis complicated by profound bradycardia.  Also has a history of seizure disorder, stage III chronic kidney disease, type 2 diabetes mellitus, atrial fibrillation, and congestive heart failure.  07/22/23 Patient is scheduled today for a complete physical exam.  However he is tearful.  Reports feeling extremely weak and tired.  He states that he has no energy.  Vital signs are significant for rapidly irregular heartbeat.  EKG was performed showing a wide qrs irregular rhythm tachycardia which appears to be atrial fibrillation with rapid ventricular response.  Patient has a known history of a right bundle branch block likely explain the wide QRS interval.  Heart rate is 155 bpm.  Upon further evaluation, the patient states that he has not been taking his metoprolol  or his Cardizem  for over a month.  He states that he ran out of the medication a month ago.  He is taking Keppra  and Eliquis .  I suspect that his lack of energy and fatigue is likely due to his severe tachycardia and uncontrolled atrial fibrillation.  With regards to his cancer, the patient has stage IV metastatic squamous cell carcinoma.  This was originally diagnosed at the base of the tongue however imaging last year revealed a mass on his biopsy.  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  Patient completed radiation therapy and was supposed to have follow-up imaging/chest CT in May.  However patient has no showed for several appointments with his oncologist  and I do not see any scheduled imaging or oncology visits pending.  Therefore I spent the majority of today's encounter dealing with these 2 issues.  First we need to get control of his heart rate.  Second we need to ensure follow-up for his stage IV cancer. 08/26/23 Patient recently saw his oncologist.  His blood pressure was extremely high at the office.  We have checked it here today and although slightly elevated it is much better than at the recent oncology visit.  However he still has tachycardia and is in atrial fibrillation.  His heart rate is between 110 and 120 on today's exam.  He states that he is consistently taking his diltiazem  180 mg a day.  He is also taking Toprol  100 mg a day.  He denies any chest pain or shortness of breath.  Recent lab work showed excellent cholesterol and a hemoglobin A1c indicating adequate control of his blood sugar.  At that time, my plan was: Blood pressure is mildly elevated but heart rate remains out of control.  Continue diltiazem  180 mg daily.  Increase Toprol -XL to 200 mg daily and recheck blood pressure and heart rate next week.  09/19/23 Patient missed his next appointment.  He is here today for follow-up.  He is currently on Cardizem  180 mg a day and Toprol  200 mg a day.  He states that his heart rate at home was typically 70-100.  His heart rate here is well-controlled.  His blood pressure slightly high.  We discussed increasing the Cardizem  to 240  however the patient does not want to take additional medication.  He is tearful today and stating that he is worn out.  He states he has to use his right hand to do everything and this is very frustrating for him.  He has a difficult time getting dressed and performing activities of daily living due to his left hemiparesis from his remote stroke.  We discussed physical therapy and the patient would be interested in this if it would help improve his quality of life.  Also discussed with him following up with his  oncologist.  It is well-documented in the chart that they have reached out in the left several messages but he has not returned their calls.  I questioned him about this.  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 Alvia, laser therapy OD   Seizures [facility])    pt reports this is from low blood sugar   Past Surgical History:  Procedure Laterality Date   BIOPSY  03/01/2023   Procedure: BIOPSY;  Surgeon: Shila Gustav GAILS, MD;  Location: [facility] ENDOSCOPY;  Service: Gastroenterology;;   BRONCHIAL NEEDLE ASPIRATION BIOPSY  10/30/2022   Procedure: BRONCHIAL NEEDLE ASPIRATION BIOPSIES;  Surgeon: Brenna Adine CROME, DO;  Location: [facility] ENDOSCOPY;  Service: Cardiopulmonary;;   COLONOSCOPY     DIRECT LARYNGOSCOPY N/A 02/10/2020   Procedure: DIRECT LARYNGOSCOPY WITH BIOPSY;  Surgeon: [name] Clunes, MD;  Location: [facility] OR;  Service: ENT;  Laterality: N/A;   Ear surgery as a child     ESOPHAGOGASTRODUODENOSCOPY N/A 03/01/2023   Procedure: ESOPHAGOGASTRODUODENOSCOPY (EGD);  Surgeon: Nandigam, Kavitha V, MD;  Location: [facility] ENDOSCOPY;  Service: Gastroenterology;  Laterality: N/A;   IR GASTROSTOMY TUBE MOD SED  03/23/2020   IR GASTROSTOMY TUBE REMOVAL  06/01/2020   IR IMAGING GUIDED PORT INSERTION  03/09/2020   IR REMOVAL TUN ACCESS W/ PORT W/O FL MOD SED  02/08/2021   TEMPORARY PACEMAKER N/A 02/26/2023   Procedure: TEMPORARY PACEMAKER;  Surgeon: Anner Alm ORN, MD;  Location: [facility] INVASIVE CV LAB;  Service: Cardiovascular;  Laterality: N/A;   THORACENTESIS  10/30/2022   Procedure: THORACENTESIS;  Surgeon:  Brenna Adine CROME, DO;  Location: [facility] ENDOSCOPY;  Service: Cardiopulmonary;;  L pleural effusion   TONSILLECTOMY     VIDEO BRONCHOSCOPY Left 10/30/2022   Procedure: VIDEO BRONCHOSCOPY WITHOUT FLUORO;  Surgeon: Brenna Adine CROME, DO;  Location: [facility] ENDOSCOPY;  Service: Cardiopulmonary;  Laterality: Left;   VIDEO BRONCHOSCOPY WITH ENDOBRONCHIAL ULTRASOUND Bilateral 10/30/2022   Procedure: VIDEO BRONCHOSCOPY WITH ENDOBRONCHIAL ULTRASOUND;  Surgeon: Brenna Adine CROME, DO;  Location: [facility] ENDOSCOPY;  Service: Cardiopulmonary;  Laterality: Bilateral;   Social History   Socioeconomic History   Marital status: Divorced    Spouse name: Not on file   Number of children: 2   Years of education: 12   Highest education level: 12th grade  Occupational History   Occupation: Disabled  Tobacco Use   Smoking status: Never    Passive exposure: Never  Smokeless tobacco: Never   Tobacco comments:    Verified by son, Vaughn Beaumier  Vaping Use   Vaping status: Never Used  Substance and Sexual Activity   Alcohol use: Yes    Alcohol/week: 0.0 standard drinks of alcohol    Comment: seldom   Drug use: Not Currently    Frequency: 7.0 times per week    Types: Marijuana    Comment: marijauna for arthritis, last 03-13-20   Sexual activity: Not Currently    Partners: Female  Other Topics Concern   Not on file  Social History Narrative   Two children.  5 grandchildren.     Social Drivers of Corporate investment banker Strain: Low Risk  (06/06/2023)   Overall Financial Resource Strain (CARDIA)    Difficulty of Paying Living Expenses: Not hard at all  Food Insecurity: No Food Insecurity (06/06/2023)   Hunger Vital Sign    Worried About Running Out of Food in the Last Year: Never true    Ran Out of Food in the Last Year: Never true  Transportation Needs: Unmet Transportation Needs (06/06/2023)   PRAPARE - Administrator, Civil Service (Medical): Yes    Lack of Transportation (Non-Medical): Yes   Physical Activity: Inactive (06/06/2023)   Exercise Vital Sign    Days of Exercise per Week: 0 days    Minutes of Exercise per Session: 0 min  Stress: No Stress Concern Present (06/06/2023)   [employer] of Occupational Health - Occupational Stress Questionnaire    Feeling of Stress : Not at all  Social Connections: Socially Isolated (06/06/2023)   Social Connection and Isolation Panel    Frequency of Communication with Friends and Family: Three times a week    Frequency of Social Gatherings with Friends and Family: Once a week    Attends Religious Services: Never    Database administrator or Organizations: No    Attends Banker Meetings: Never    Marital Status: Divorced  Catering manager Violence: Not At Risk (06/06/2023)   Humiliation, Afraid, Rape, and Kick questionnaire    Fear of Current or Ex-Partner: No    Emotionally Abused: No    Physically Abused: No    Sexually Abused: No   Current Outpatient Medications on File Prior to Visit  Medication Sig Dispense Refill   apixaban  (ELIQUIS ) 5 MG TABS tablet Take 1 tablet (5 mg total) by mouth 2 (two) times daily. 60 tablet 5   atorvastatin  (LIPITOR) 20 MG tablet Take 1 tablet (20 mg total) by mouth at bedtime. 90 tablet 0   azelastine  (ASTELIN ) 0.1 % nasal spray Place 2 sprays into both nostrils 2 (two) times daily. Use in each nostril as directed 30 mL 12   Continuous Glucose Receiver (DEXCOM G7 RECEIVER) DEVI Use to continuously check blood sugar daily. 1 each 1   Continuous Glucose Sensor (DEXCOM G7 SENSOR) MISC Use to continuously monitor glucose daily. Change meter every 10 days. 3 each 5   diltiazem  (CARDIZEM  CD) 180 MG 24 hr capsule Take 1 capsule (180 mg total) by mouth daily. 90 capsule 3   empagliflozin  (JARDIANCE ) 25 MG TABS tablet 25 mg.     levETIRAcetam  (KEPPRA ) 500 MG tablet Take 1 tablet (500 mg total) by mouth 2 (two) times daily. 180 tablet 3   metoprolol  (TOPROL -XL) 200 MG 24 hr tablet Take 1 tablet  (200 mg total) by mouth daily. 90 tablet 3   pantoprazole  (PROTONIX ) 40 MG tablet Take 1  tablet (40 mg total) by mouth daily. 90 tablet 3   fluticasone  (FLONASE ) 50 MCG/ACT nasal spray Place 2 sprays into both nostrils daily. (Patient not taking: Reported on 09/19/2023) 16 g 6   No current facility-administered medications on file prior to visit.   Allergies  Allergen Reactions   Codeine Other (See Comments)    Unknown reaction, severe headach   Metformin  And Related Diarrhea     Review of Systems  All other systems reviewed and are negative.      Objective:   Physical Exam Vitals reviewed.  Constitutional:      General: He is not in acute distress.    Appearance: Normal appearance. He is normal weight. He is not ill-appearing, toxic-appearing or diaphoretic.  Cardiovascular:     Rate and Rhythm: Normal rate. Rhythm irregular.     Heart sounds: Normal heart sounds. No murmur heard.    No friction rub. No gallop.  Pulmonary:     Effort: Pulmonary effort is normal.     Breath sounds: Normal breath sounds. No wheezing, rhonchi or rales.  Abdominal:     General: Abdomen is flat. Bowel sounds are normal.     Palpations: Abdomen is soft.  Musculoskeletal:     Right lower leg: No edema.     Left lower leg: No edema.  Neurological:     Mental Status: He is alert and oriented to person, place, and time. Mental status is at baseline.     Motor: Weakness present.  Psychiatric:        Attention and Perception: Attention normal.    Assessment & Plan:  Persistent atrial fibrillation (HCC)  Left spastic hemiparesis (HCC)  Heart rate is now less than 100 and controlled.  Systolic blood pressure is 152.  I feel that this is reasonable given how weak and frail this patient is.  He is hesitant to take any additional medication at this time.  I provided the patient with contact information for his oncologist.  I explained to him that they are reaching out to try to schedule a follow-up  appointment to review his CAT scans and discuss his treatment options for his malignancy.  I begged the patient to call this number to make an appointment.  Meanwhile I will schedule the patient for physical therapy to try to help his functioning given his chronic left hemiparesis

## 2023-09-24 ENCOUNTER — Other Ambulatory Visit: Payer: Self-pay

## 2023-09-24 DIAGNOSIS — C3492 Malignant neoplasm of unspecified part of left bronchus or lung: Secondary | ICD-10-CM

## 2023-09-24 DIAGNOSIS — C3432 Malignant neoplasm of lower lobe, left bronchus or lung: Secondary | ICD-10-CM

## 2023-09-24 DIAGNOSIS — C01 Malignant neoplasm of base of tongue: Secondary | ICD-10-CM

## 2023-09-27 NOTE — Progress Notes (Signed)
 Oncology Nurse Navigator Documentation   I left a VM with Mr. Hanning son Beverley informing him that Dr. Loretha has placed a referral to palliative care to discuss symptom management and goals of care with Mr. Gruenwald. I left my direct contact information and encouraged him to call back if he had any questions or concerns.   [name] RN, BSN, OCN Head & Neck Oncology Nurse Navigator [facility] at [facility] Phone # [phone]  Fax # [phone]

## 2023-09-30 NOTE — Progress Notes (Signed)
 Oncology Nurse Navigator Documentation   Mr. Kraeger called me today to tell me that his PCP would like him to see Dr. Loretha to discuss his recent scan. I informed him that he was already scheduled with her on 10/03/23 at 9:30 to review his CT results. He was very appreciative of the information and plans to attend the appointment as scheduled.  [name] RN, BSN, OCN Head & Neck Oncology Nurse Navigator [facility] at [facility] Phone # [phone]  Fax # [phone]

## 2023-10-02 ENCOUNTER — Telehealth: Payer: Self-pay | Admitting: Nurse Practitioner

## 2023-10-02 NOTE — Telephone Encounter (Signed)
 Scheduled appointment per staff message. Called and left a VM with appointment details for the patient.

## 2023-10-03 ENCOUNTER — Inpatient Hospital Stay: Admitting: Hematology and Oncology

## 2023-10-14 NOTE — Progress Notes (Signed)
 Oncology Nurse Navigator Documentation   I called Mr. Owczarzak to remind him of his palliative care appointment tomorrow here at the [facility] tomorrow at 11:00. He voiced his understanding and plans to attend the appointment. He has my number to call if he has any questions or concerns.  [name] RN, BSN, OCN Head & Neck Oncology Nurse Navigator  Cancer Center at [facility] Phone # [phone]  Fax # [phone]

## 2023-10-15 ENCOUNTER — Inpatient Hospital Stay: Attending: Hematology and Oncology | Admitting: Nurse Practitioner

## 2023-10-15 ENCOUNTER — Encounter: Payer: Self-pay | Admitting: Nurse Practitioner

## 2023-10-15 VITALS — BP 155/119 | HR 104 | Temp 97.4°F | Resp 20 | Wt 162.6 lb

## 2023-10-15 DIAGNOSIS — Z515 Encounter for palliative care: Secondary | ICD-10-CM | POA: Diagnosis not present

## 2023-10-15 DIAGNOSIS — R634 Abnormal weight loss: Secondary | ICD-10-CM | POA: Diagnosis not present

## 2023-10-15 DIAGNOSIS — R630 Anorexia: Secondary | ICD-10-CM

## 2023-10-15 DIAGNOSIS — R530 Neoplastic (malignant) related fatigue: Secondary | ICD-10-CM | POA: Diagnosis not present

## 2023-10-15 DIAGNOSIS — C771 Secondary and unspecified malignant neoplasm of intrathoracic lymph nodes: Secondary | ICD-10-CM

## 2023-10-15 DIAGNOSIS — C4492 Squamous cell carcinoma of skin, unspecified: Secondary | ICD-10-CM | POA: Diagnosis not present

## 2023-10-15 DIAGNOSIS — Z7189 Other specified counseling: Secondary | ICD-10-CM | POA: Diagnosis not present

## 2023-10-18 ENCOUNTER — Ambulatory Visit: Admitting: Family Medicine

## 2023-10-22 NOTE — Progress Notes (Signed)
 Palliative Medicine [facility]  [phone] [phone]   Name: BENSEN CHADDERDON Date: 10/22/2023 MRN: [id number]  DOB: [birth year]  Patient Care Team: Duanne Butler DASEN, MD as PCP - General (Family Medicine) Lavona Agent, MD as PCP - Cardiology (Cardiology) [name], Delon LITTIE, RN as Oncology Nurse Navigator Izell Domino, MD as Consulting Physician (Radiation Oncology) Loretha Ash, MD as Consulting Physician (Hematology and Oncology) Daryle Heron LITTIE, RD as Dietitian (Nutrition) Jacelyn Lupita NOVAK, [name] as Speech Language Pathologist (Speech Pathology) Lanis Carbon, Florina LITTIE, PT as Physical Therapist (Physical Therapy) Stacia Arlean BROCKS, [name] (Inactive) as Social Worker (General Practice) [name] Clunes, MD as Consulting Physician (Otolaryngology) Nicholaus Sherlean LITTIE, [facility] (Inactive) as Pharmacist (Pharmacist) Lavona Agent, MD as Consulting Physician (Cardiology)    INTERVAL HISTORY: KIYON FIDALGO is a 68 y.o. 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 Previous history also includes CKD, a-fib, type 2 diabetes, HLD, heart failure, and a history of seizures. Palliative ask to see for symptom management and goals of care.   SOCIAL HISTORY:     reports that he has never smoked. He has never been exposed to tobacco smoke. He has never used smokeless tobacco. He reports current alcohol use. He reports that he does not currently use drugs after having used the following drugs: Marijuana. Frequency: 7.00 times per week.  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 Alvia, laser therapy OD   Seizures (HCC)    pt reports this is from low blood sugar    ALLERGIES:  is allergic to codeine and metformin  and related.  MEDICATIONS:  Current Outpatient Medications  Medication Sig Dispense Refill   apixaban  (ELIQUIS ) 5 MG TABS tablet Take 1 tablet (5 mg total) by mouth 2 (two) times daily. 60 tablet 5   atorvastatin  (LIPITOR) 20 MG tablet Take 1 tablet (20 mg total) by mouth at bedtime. 90 tablet 0   azelastine  (ASTELIN ) 0.1 % nasal spray Place 2 sprays into both nostrils 2 (two) times daily. Use in each nostril as directed 30 mL 12   Continuous Glucose Receiver (DEXCOM G7 RECEIVER) DEVI Use to continuously check blood sugar daily. 1 each 1   Continuous Glucose Sensor (DEXCOM G7 SENSOR) MISC Use to continuously monitor glucose daily. Change meter every 10 days. 3 each 5   diltiazem  (CARDIZEM  CD) 180 MG 24 hr capsule Take 1 capsule (180 mg total) by mouth daily. 90 capsule 3   empagliflozin  (JARDIANCE ) 25 MG TABS tablet 25 mg.     fluticasone  (FLONASE ) 50 MCG/ACT nasal spray Place 2 sprays into both nostrils daily. (Patient not taking: Reported on 09/19/2023) 16 g 6   levETIRAcetam  (KEPPRA ) 500 MG tablet Take 1 tablet (500 mg total) by mouth 2 (two) times daily. 180 tablet 3   metoprolol  (TOPROL -XL) 200 MG 24 hr tablet  Take 1 tablet (200 mg total) by mouth daily. 90 tablet 3   pantoprazole  (PROTONIX ) 40 MG tablet Take 1 tablet (40 mg total) by mouth daily. 90 tablet 3   No current facility-administered medications for this visit.    VITAL SIGNS: BP (!) 155/119 (BP Location: Right Arm, Patient Position: Sitting) Comment: provider notified  Pulse (!) 104   Temp (!) 97.4 F (36.3 C) (Temporal)   Resp 20   Wt 162 lb 9.6 oz (73.8 kg)   SpO2 98%   BMI 24.01 kg/m   Filed Weights   10/15/23 1127  Weight: 162 lb 9.6 oz (73.8 kg)    Estimated body mass index is 24.01 kg/m as calculated from the following:   Height as of 09/19/23: 5' 9 (1.753 m).   Weight as of this encounter: 162 lb 9.6 oz (73.8 kg).   PERFORMANCE STATUS (ECOG) : 1 - Symptomatic but completely ambulatory  Physical Exam General: NAD Cardiovascular: regular rate and rhythm Pulmonary: normal breathing pattern Extremities: no edema, left arm weakness post stroke. Skin: no rashes Neurological: AAO x3  IMPRESSION: Discussed the use of AI scribe software for clinical note transcription with the patient, who gave verbal consent to proceed.  History of Present Illness Brian [name] is a 68 year old male with a history of stroke and throat cancer who presents with worsening symptoms related to these conditions. I initially saw patient in October 2024 however at th time he is doing well and did  not require close follow-up. Unfortunately he is now challenged with fatigue, weakness, and decreased appetite.   He has significant difficulties following a major stroke, which has left his left arm and hand non-functional. He is unable to manage his nail care independently. During visit today I was able to cut patient's nails as his nails are causing irritation to his palm due to contracture.   He experiences ongoing issues related to throat cancer, including persistent nasal drainage, particularly when eating. He uses two nasal sprays, Flonase  and another unspecified type, but neither has been effective. He has not tried saline nasal spray. He also has difficulty eating due to thick saliva, which causes pills and food to get stuck in his throat, leading to regurgitation. He has lost weight, currently weighing 162 pounds, down from 175 pounds, and previously 209 pounds four years ago. His taste buds have been affected by radiation treatments, though they are slowly recovering.  He lives with his  friend Arnie and Arnie's girlfriend's son, though only Arnie provides assistance, such as helping him tie his pants. He has a strained relationship with his daughter, who lives in [place], and has not seen his youngest granddaughter. His son lives in [place], [place] , and works for [employer].  He mentions a history of lung cancer, which he attributes to secondhand smoke exposure from his parents, as he has never smoked cigarettes himself. He has smoked marijuana in the past. He recalls having a CT scan in August, though there is some confusion about the timing of his last scan. I reviewed at length his schedule and any required upcoming appointments.   We discussed his current illness and what it means in the larger context of his on-going co-morbidities. Natural disease trajectory and expectations were discussed.   Mr. Guster is realistic in his understanding. He is taking things one day at a time. I empathetically approached discussions regarding his healthcare limitations. He states he does not have a documented advanced directives. Education provided and services  offered. He would like some time to review. Jeric does verbalized that his son, Beverley would be his primary medical decision maker in the event he could not speak for himself.    We discussed his wishes in a cardiopulmonary event. He acknowledges his understanding of DNR/DNI sharing he had a Golden Rod at home for his mother. He has not directly thought of what he would want his wishes to be for himself. Shares he would want to be around for as long as he could for his grandchildren if his quality of life was sufficient however would not want his life prolonged by machines or if he was bedridden or in a comatose state.    I discussed the importance of continued conversation with family and their medical providers regarding overall plan of care and treatment options, ensuring decisions are within the context of the patients values and  GOCs.   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 Significant difficulty with left arm and hand function, including inability to perform tasks such as tying a bow. Long fingernails causing discomfort and potential skin injury. - Assist with nail clipping to prevent skin injury  Malignant neoplasm of oropharynx with dysphagia, dysgeusia, and unintentional weight loss Ongoing issues with dysphagia and dysgeusia secondary to oropharyngeal cancer. Significant weight loss from 209 lbs to 162 lbs over four years. Thick saliva causing difficulty swallowing pills and food, leading to regurgitation. Taste buds slowly recovering post-radiation therapy. - Schedule follow-up with GI doctor for esophageal dilation - Discuss with Doctor Ruku about esophageal dilation  Chronic rhinitis with persistent nasal drainage refractory to intranasal corticosteroids and antihistamines Persistent nasal drainage not responsive to Flonase  and azelastine . Symptoms exacerbated by eating and emotional stress. No trial of saline nasal spray yet. - Consider trial of saline nasal spray to reduce nasal drainage  Goals of Care Encouraged to consider his healthcare wishes and completion of advanced directives. Education provided.   I will plan to see patient back in 3-4 weeks. Sooner if needed.   Patient expressed understanding and was in agreement with this plan. He also understands that He can call the clinic at any time with any questions, concerns, or complaints.   Any controlled substances utilized were prescribed in the context of palliative care. PDMP has been reviewed.   Visit consisted of counseling and education dealing with the complex and emotionally intense issues of symptom management and palliative care in the setting of serious and potentially life-threatening  illness.  Levon Borer, AGPCNP-BC  Palliative Medicine Team/[facility]

## 2023-10-31 ENCOUNTER — Ambulatory Visit (INDEPENDENT_AMBULATORY_CARE_PROVIDER_SITE_OTHER): Admitting: Family Medicine

## 2023-10-31 ENCOUNTER — Encounter: Payer: Self-pay | Admitting: Family Medicine

## 2023-10-31 VITALS — BP 138/76 | HR 104 | Temp 97.8°F | Ht 69.0 in | Wt 156.6 lb

## 2023-10-31 DIAGNOSIS — K222 Esophageal obstruction: Secondary | ICD-10-CM

## 2023-10-31 DIAGNOSIS — E1169 Type 2 diabetes mellitus with other specified complication: Secondary | ICD-10-CM

## 2023-10-31 DIAGNOSIS — I4819 Other persistent atrial fibrillation: Secondary | ICD-10-CM

## 2023-10-31 DIAGNOSIS — Z7984 Long term (current) use of oral hypoglycemic drugs: Secondary | ICD-10-CM

## 2023-10-31 MED ORDER — EMPAGLIFLOZIN 25 MG PO TABS: 25.0000 mg | ORAL_TABLET | Freq: Every day | ORAL | 3 refills | Status: DC

## 2023-10-31 NOTE — Progress Notes (Signed)
 Subjective:    Patient ID: Brian [name], male    DOB: [birth year], 68 y.o.   MRN: [id number]  Despite being on his medication list, the patient has stopped Jardiance .  The average blood sugar on his continuous blood glucose monitoring for the last 90 days is 170.  For the last 30 days it is 180.  For the last 14 days it is 220.  Over the last week his blood sugar has been between 200 and 300.  He admits that he is eating a biscuit meal frequently and drinking Ensure while in the biscuits.  He denies any other change in his diet.  He states that he attributes the rise in his sugars to stopping Jardiance .  He has a large left pleural effusion seen on CT scan.  He does have diminished breath sounds in the left base however he denies any shortness of breath or dyspnea on exertion.  His heart rate although irregular, is well-controlled today.  There is no evidence of fluid overload on exam. 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 Alvia, laser therapy OD   Seizures [facility])    pt reports this is from low blood sugar   Past Surgical History:  Procedure Laterality Date   BIOPSY  03/01/2023   Procedure: BIOPSY;  Surgeon: Shila Gustav GAILS, MD;  Location: [facility] ENDOSCOPY;  Service: Gastroenterology;;   BRONCHIAL NEEDLE ASPIRATION BIOPSY  10/30/2022   Procedure: BRONCHIAL NEEDLE ASPIRATION BIOPSIES;  Surgeon: Brenna Adine LITTIE, DO;  Location: [facility] ENDOSCOPY;  Service: Cardiopulmonary;;   COLONOSCOPY     DIRECT LARYNGOSCOPY N/A 02/10/2020   Procedure: DIRECT LARYNGOSCOPY WITH BIOPSY;  Surgeon: [name] Clunes, MD;  Location: [facility] OR;  Service: ENT;  Laterality: N/A;    Ear surgery as a child     ESOPHAGOGASTRODUODENOSCOPY N/A 03/01/2023   Procedure: ESOPHAGOGASTRODUODENOSCOPY (EGD);  Surgeon: Nandigam, Kavitha V, MD;  Location: [facility] ENDOSCOPY;  Service: Gastroenterology;  Laterality: N/A;   IR GASTROSTOMY TUBE MOD SED  03/23/2020   IR GASTROSTOMY TUBE REMOVAL  06/01/2020   IR IMAGING GUIDED PORT INSERTION  03/09/2020   IR REMOVAL TUN ACCESS W/ PORT W/O FL MOD SED  02/08/2021   TEMPORARY PACEMAKER N/A 02/26/2023   Procedure: TEMPORARY PACEMAKER;  Surgeon: Anner Alm ORN, MD;  Location: [facility] INVASIVE CV LAB;  Service: Cardiovascular;  Laterality: N/A;   THORACENTESIS  10/30/2022   Procedure: THORACENTESIS;  Surgeon: Brenna Adine LITTIE, DO;  Location: [facility] ENDOSCOPY;  Service: Cardiopulmonary;;  L pleural effusion   TONSILLECTOMY     VIDEO BRONCHOSCOPY Left 10/30/2022   Procedure: VIDEO BRONCHOSCOPY WITHOUT FLUORO;  Surgeon: Brenna Adine LITTIE, DO;  Location: [facility] ENDOSCOPY;  Service: Cardiopulmonary;  Laterality: Left;   VIDEO BRONCHOSCOPY WITH ENDOBRONCHIAL ULTRASOUND Bilateral 10/30/2022   Procedure: VIDEO BRONCHOSCOPY WITH ENDOBRONCHIAL ULTRASOUND;  Surgeon: Brenna Adine LITTIE, DO;  Location: [facility] ENDOSCOPY;  Service: Cardiopulmonary;  Laterality: Bilateral;   Social History   Socioeconomic History   Marital status: Divorced    Spouse name: Not on file   Number of children: 2  Years of education: 72   Highest education level: 12th grade  Occupational History   Occupation: Disabled  Tobacco Use   Smoking status: Never    Passive exposure: Never   Smokeless tobacco: Never   Tobacco comments:    Verified by son, [name]  Vaping Use   Vaping status: Never Used  Substance and Sexual Activity   Alcohol use: Yes    Alcohol/week: 0.0 standard drinks of alcohol    Comment: seldom   Drug use: Not Currently    Frequency: 7.0 times per week    Types: Marijuana    Comment: marijauna for arthritis, last 03-13-20   Sexual activity: Not Currently    Partners:  Female  Other Topics Concern   Not on file  Social History Narrative   Two children.  5 grandchildren.     Social Drivers of Corporate investment banker Strain: Low Risk  (06/06/2023)   Overall Financial Resource Strain (CARDIA)    Difficulty of Paying Living Expenses: Not hard at all  Food Insecurity: No Food Insecurity (06/06/2023)   Hunger Vital Sign    Worried About Running Out of Food in the Last Year: Never true    Ran Out of Food in the Last Year: Never true  Transportation Needs: Unmet Transportation Needs (06/06/2023)   PRAPARE - Administrator, Civil Service (Medical): Yes    Lack of Transportation (Non-Medical): Yes  Physical Activity: Inactive (06/06/2023)   Exercise Vital Sign    Days of Exercise per Week: 0 days    Minutes of Exercise per Session: 0 min  Stress: No Stress Concern Present (06/06/2023)   [employer] of Occupational Health - Occupational Stress Questionnaire    Feeling of Stress : Not at all  Social Connections: Socially Isolated (06/06/2023)   Social Connection and Isolation Panel    Frequency of Communication with Friends and Family: Three times a week    Frequency of Social Gatherings with Friends and Family: Once a week    Attends Religious Services: Never    Database administrator or Organizations: No    Attends Banker Meetings: Never    Marital Status: Divorced  Catering manager Violence: Not At Risk (06/06/2023)   Humiliation, Afraid, Rape, and Kick questionnaire    Fear of Current or Ex-Partner: No    Emotionally Abused: No    Physically Abused: No    Sexually Abused: No   Current Outpatient Medications on File Prior to Visit  Medication Sig Dispense Refill   apixaban  (ELIQUIS ) 5 MG TABS tablet Take 1 tablet (5 mg total) by mouth 2 (two) times daily. 60 tablet 5   atorvastatin  (LIPITOR) 20 MG tablet Take 1 tablet (20 mg total) by mouth at bedtime. 90 tablet 0   azelastine  (ASTELIN ) 0.1 % nasal spray Place 2  sprays into both nostrils 2 (two) times daily. Use in each nostril as directed 30 mL 12   Continuous Glucose Receiver (DEXCOM G7 RECEIVER) DEVI Use to continuously check blood sugar daily. 1 each 1   Continuous Glucose Sensor (DEXCOM G7 SENSOR) MISC Use to continuously monitor glucose daily. Change meter every 10 days. 3 each 5   diltiazem  (CARDIZEM  CD) 180 MG 24 hr capsule Take 1 capsule (180 mg total) by mouth daily. 90 capsule 3   empagliflozin  (JARDIANCE ) 25 MG TABS tablet 25 mg.     levETIRAcetam  (KEPPRA ) 500 MG tablet Take 1 tablet (500 mg total) by mouth 2 (two) times  daily. 180 tablet 3   metoprolol  (TOPROL -XL) 200 MG 24 hr tablet Take 1 tablet (200 mg total) by mouth daily. 90 tablet 3   pantoprazole  (PROTONIX ) 40 MG tablet Take 1 tablet (40 mg total) by mouth daily. 90 tablet 3   fluticasone  (FLONASE ) 50 MCG/ACT nasal spray Place 2 sprays into both nostrils daily. (Patient not taking: Reported on 10/31/2023) 16 g 6   No current facility-administered medications on file prior to visit.   Allergies  Allergen Reactions   Codeine Other (See Comments)    Unknown reaction, severe headach   Metformin  And Related Diarrhea     Review of Systems  All other systems reviewed and are negative.      Objective:   Physical Exam Vitals reviewed.  Constitutional:      General: He is not in acute distress.    Appearance: Normal appearance. He is normal weight. He is not ill-appearing, toxic-appearing or diaphoretic.  Cardiovascular:     Rate and Rhythm: Normal rate. Rhythm irregular.     Heart sounds: Normal heart sounds. No murmur heard.    No friction rub. No gallop.  Pulmonary:     Effort: Pulmonary effort is normal.     Breath sounds: Examination of the left-middle field reveals decreased breath sounds. Examination of the left-lower field reveals decreased breath sounds. Decreased breath sounds present. No wheezing, rhonchi or rales.  Abdominal:     General: Abdomen is flat. Bowel  sounds are normal.     Palpations: Abdomen is soft.  Musculoskeletal:     Right lower leg: No edema.     Left lower leg: No edema.  Neurological:     Mental Status: He is alert and oriented to person, place, and time. Mental status is at baseline.     Motor: Weakness present.  Psychiatric:        Attention and Perception: Attention normal.    Assessment & Plan:  Esophageal stricture - Plan: Ambulatory referral to Gastroenterology  Type 2 diabetes mellitus with other specified complication, without long-term current use of insulin  (HCC) - Plan: Comprehensive metabolic panel with GFR, CBC with Differential/Platelet, Hemoglobin A1c  Persistent atrial fibrillation (HCC) - Plan: Comprehensive metabolic panel with GFR, CBC with Differential/Platelet, Hemoglobin A1c  Heart rate is now less than 100 and controlled.  Blood pressure today is controlled.  Sugars are out of control.  We discussed starting glipizide  versus resuming Jardiance .  Patient elects to resume Jardiance .  He will update me with his sugars in 1 to 2 weeks.  Ideally I like to see his sugars under 200.  Patient does report stricture.  He is having a difficult time swallowing.  His food and his pills are getting stuck in his throat.  He would like to see GI to discuss possible esophageal dilatation.  I will consult GI.

## 2023-11-01 ENCOUNTER — Ambulatory Visit: Payer: Self-pay | Admitting: Family Medicine

## 2023-11-01 ENCOUNTER — Telehealth: Payer: Self-pay

## 2023-11-01 LAB — COMPREHENSIVE METABOLIC PANEL WITH GFR
AG Ratio: 1.4 (calc) (ref 1.0–2.5)
ALT: 6 U/L — ABNORMAL LOW (ref 9–46)
AST: 8 U/L — ABNORMAL LOW (ref 10–35)
Albumin: 3.8 g/dL (ref 3.6–5.1)
Alkaline phosphatase (APISO): 82 U/L (ref 35–144)
BUN/Creatinine Ratio: 10 (calc) (ref 6–22)
BUN: 17 mg/dL (ref 7–25)
CO2: 27 mmol/L (ref 20–32)
Calcium: 8.9 mg/dL (ref 8.6–10.3)
Chloride: 101 mmol/L (ref 98–110)
Creat: 1.73 mg/dL — ABNORMAL HIGH (ref 0.70–1.28)
Globulin: 2.7 g/dL (ref 1.9–3.7)
Glucose, Bld: 271 mg/dL — ABNORMAL HIGH (ref 65–99)
Potassium: 4.2 mmol/L (ref 3.5–5.3)
Sodium: 138 mmol/L (ref 135–146)
Total Bilirubin: 0.5 mg/dL (ref 0.2–1.2)
Total Protein: 6.5 g/dL (ref 6.1–8.1)
eGFR: 42 mL/min/1.73m2 — ABNORMAL LOW (ref >=60)

## 2023-11-01 LAB — CBC WITH DIFFERENTIAL/PLATELET
Absolute Lymphocytes: 397 {cells}/uL — ABNORMAL LOW (ref 850–3900)
Absolute Monocytes: 576 {cells}/uL (ref 200–950)
Basophils Absolute: 19 {cells}/uL (ref 0–200)
Basophils Relative: 0.3 %
Eosinophils Absolute: 32 {cells}/uL (ref 15–500)
Eosinophils Relative: 0.5 %
HCT: 38.2 % — ABNORMAL LOW (ref 38.5–50.0)
Hemoglobin: 12.3 g/dL — ABNORMAL LOW (ref 13.2–17.1)
MCH: 28.1 pg (ref 27.0–33.0)
MCHC: 32.2 g/dL (ref 32.0–36.0)
MCV: 87.4 fL (ref 80.0–100.0)
MPV: 8.7 fL (ref 7.5–12.5)
Monocytes Relative: 9 %
Neutro Abs: 5376 {cells}/uL (ref 1500–7800)
Neutrophils Relative %: 84 %
Platelets: 250 Thousand/uL (ref 140–400)
RBC: 4.37 Million/uL (ref 4.20–5.80)
RDW: 13.5 % (ref 11.0–15.0)
Total Lymphocyte: 6.2 %
WBC: 6.4 Thousand/uL (ref 3.8–10.8)

## 2023-11-01 LAB — HEMOGLOBIN A1C
Hgb A1c MFr Bld: 7.9 % — ABNORMAL HIGH (ref <5.7)
Mean Plasma Glucose: 180 mg/dL
eAG (mmol/L): 10 mmol/L

## 2023-11-01 NOTE — Telephone Encounter (Signed)
 Updated medication list received from [facility] for pt's chart. Mjp,lpn

## 2023-11-05 ENCOUNTER — Other Ambulatory Visit: Payer: Self-pay | Admitting: Family Medicine

## 2023-11-05 ENCOUNTER — Ambulatory Visit: Payer: Self-pay

## 2023-11-05 MED ORDER — GLIPIZIDE ER 5 MG PO TB24: 5.0000 mg | ORAL_TABLET | Freq: Every day | ORAL | 3 refills | Status: DC

## 2023-11-05 NOTE — Progress Notes (Signed)
 Glipizide?

## 2023-11-05 NOTE — Telephone Encounter (Signed)
 FYI Only or Action Required?: Action required by provider: new med request.  Patient was last seen in primary care on 10/31/2023 by Brian Butler DASEN, MD.  Called Nurse Triage reporting New Med Request.  Symptoms began N/A.  Interventions attempted: Other: N/A.  Symptoms are: N/A..  Triage Disposition: Call PCP Within 24 Hours (overriding See PCP When Office is Open (Within 3 Days))  Patient/caregiver understands and will follow disposition?: Yes            Copied from CRM #[id number]. Topic: Clinical - Medical Advice >> Nov 05, 2023  1:13 PM Brian [name] wrote: Reason for CRM: Patient wants to speak to a nurse about his diabetes, stated he needs someone to call asap. Callback number [phone] Reason for Disposition  Prescription request for new medicine (not a refill)  Answer Assessment - Initial Assessment Questions 1. NAME of MEDICINE: What medicine(s) are you calling about?     Glipizide .  2. QUESTION: What is your question? (e.[name]., double dose of medicine, side effect)     Patient states at his last OV with Dr Brian last week they discussed glipizide  and he would like to have that prescribed instead of jardiance .  3. PRESCRIBER: Who prescribed the medicine? Reason: if prescribed by specialist, call should be referred to that group.     Not currently prescribed.  4. SYMPTOMS: Do you have any symptoms? If Yes, ask: What symptoms are you having?  How bad are the symptoms (e.[name]., mild, moderate, severe)     He reports his blood sugars have been running high 100s to 200s and the highest it has gotten is 354. He also mentioned his erectile dysfunction but states he does not want to discuss that further and will talk to Dr Brian about that at his next appointment.  5. PREGNANCY:  Is there any chance that you are pregnant? When was your last menstrual period?     N/A.   He would like the glipizide  sent to his preferred pharmacy [id number] on [address]  Protocols used: Medication Question Call-A-AH

## 2023-11-06 ENCOUNTER — Other Ambulatory Visit: Payer: Self-pay

## 2023-11-06 NOTE — Telephone Encounter (Signed)
 Spoke with patient. Pt advised to discontinue Jardiance  and start Glipizide , per Dr. Clara advise. Pt verbalized understanding of all. Mjp,lpn

## 2023-11-07 ENCOUNTER — Other Ambulatory Visit: Payer: Self-pay | Admitting: Family Medicine

## 2023-11-07 DIAGNOSIS — I480 Paroxysmal atrial fibrillation: Secondary | ICD-10-CM

## 2023-11-07 NOTE — Telephone Encounter (Signed)
 Requested Prescriptions  Pending Prescriptions Disp Refills   apixaban  (ELIQUIS ) 5 MG TABS tablet [Pharmacy Med Name: ELIQUIS  5 MG TABLET] 180 tablet 0    Sig: TAKE 1 TABLET BY MOUTH TWICE A DAY     Hematology:  Anticoagulants - apixaban  Failed - 11/07/2023  4:10 PM      Failed - HGB in normal range and within 360 days    Hemoglobin  Date Value Ref Range Status  10/31/2023 12.3 (L) 13.2 - 17.1 g/dL Final  92/70/7977 88.0 (L) 13.0 - 17.0 g/dL Final         Failed - HCT in normal range and within 360 days    HCT  Date Value Ref Range Status  10/31/2023 38.2 (L) 38.5 - 50.0 % Final         Failed - Cr in normal range and within 360 days    Creat  Date Value Ref Range Status  10/31/2023 1.73 (H) 0.70 - 1.28 mg/dL Final   Creatinine, Urine  Date Value Ref Range Status  12/12/2009 226.8 mg/dL Final         Failed - AST in normal range and within 360 days    AST  Date Value Ref Range Status  10/31/2023 8 (L) 10 - 35 U/L Final  03/20/2021 14 (L) 15 - 41 U/L Final         Failed - ALT in normal range and within 360 days    ALT  Date Value Ref Range Status  10/31/2023 6 (L) 9 - 46 U/L Final  03/20/2021 13 0 - 44 U/L Final         Passed - PLT in normal range and within 360 days    Platelets  Date Value Ref Range Status  10/31/2023 250 140 - 400 Thousand/uL Final   Platelet Count  Date Value Ref Range Status  09/16/2020 161 150 - 400 K/uL Final         Passed - Valid encounter within last 12 months    Recent Outpatient Visits           1 week ago Esophageal stricture   [place] [facility] Duanne Butler DASEN, MD   1 month ago Persistent atrial fibrillation [facility])   [place] [facility] Duanne Butler DASEN, MD   2 months ago Persistent atrial fibrillation [facility])   [place] [facility] Duanne Butler DASEN, MD   3 months ago Upper GI bleed   [place] [facility] Duanne Butler DASEN, MD   5 months  ago Atrial fibrillation, unspecified type [facility])   [place] [facility] [place], Butler DASEN, MD

## 2023-11-14 ENCOUNTER — Ambulatory Visit: Admitting: Family Medicine

## 2023-11-21 ENCOUNTER — Inpatient Hospital Stay: Attending: Hematology and Oncology | Admitting: Hematology and Oncology

## 2023-11-21 ENCOUNTER — Telehealth: Payer: Self-pay

## 2023-11-21 ENCOUNTER — Other Ambulatory Visit: Payer: Self-pay | Admitting: Family Medicine

## 2023-11-21 ENCOUNTER — Inpatient Hospital Stay: Admitting: Nurse Practitioner

## 2023-11-21 NOTE — Telephone Encounter (Signed)
 Rn attempted to call pt about no-show for today's appt after confirming with pt yesterday he would be here.no answer, LVM

## 2023-11-23 ENCOUNTER — Emergency Department (HOSPITAL_COMMUNITY)
Admission: EM | Admit: 2023-11-23 | Discharge: 2023-11-24 | Disposition: A | Discharge status: Home / Self Care | Attending: Emergency Medicine | Admitting: Emergency Medicine

## 2023-11-23 DIAGNOSIS — Z7901 Long term (current) use of anticoagulants: Secondary | ICD-10-CM | POA: Diagnosis not present

## 2023-11-23 DIAGNOSIS — E119 Type 2 diabetes mellitus without complications: Secondary | ICD-10-CM | POA: Diagnosis not present

## 2023-11-23 DIAGNOSIS — J90 Pleural effusion, not elsewhere classified: Secondary | ICD-10-CM | POA: Diagnosis not present

## 2023-11-23 DIAGNOSIS — M545 Low back pain, unspecified: Secondary | ICD-10-CM | POA: Insufficient documentation

## 2023-11-23 DIAGNOSIS — Z8673 Personal history of transient ischemic attack (TIA), and cerebral infarction without residual deficits: Secondary | ICD-10-CM | POA: Diagnosis not present

## 2023-11-23 DIAGNOSIS — R42 Dizziness and giddiness: Secondary | ICD-10-CM | POA: Diagnosis not present

## 2023-11-23 DIAGNOSIS — I4891 Unspecified atrial fibrillation: Secondary | ICD-10-CM | POA: Insufficient documentation

## 2023-11-23 DIAGNOSIS — R531 Weakness: Secondary | ICD-10-CM | POA: Diagnosis not present

## 2023-11-23 DIAGNOSIS — I700 Atherosclerosis of aorta: Secondary | ICD-10-CM | POA: Diagnosis not present

## 2023-11-23 NOTE — ED Triage Notes (Signed)
 Pt bib EMS from home for lower right back pain with numbness down right leg (progressive x past few months, difficulty ambulating). Hx of stroke 14 years ago with deficits on left side. Chronic weakness/dizziness

## 2023-11-24 ENCOUNTER — Other Ambulatory Visit: Payer: Self-pay

## 2023-11-24 ENCOUNTER — Encounter (HOSPITAL_COMMUNITY): Payer: Self-pay

## 2023-11-24 ENCOUNTER — Emergency Department (HOSPITAL_COMMUNITY)

## 2023-11-24 DIAGNOSIS — I700 Atherosclerosis of aorta: Secondary | ICD-10-CM | POA: Diagnosis not present

## 2023-11-24 DIAGNOSIS — M48061 Spinal stenosis, lumbar region without neurogenic claudication: Secondary | ICD-10-CM | POA: Diagnosis not present

## 2023-11-24 DIAGNOSIS — M47816 Spondylosis without myelopathy or radiculopathy, lumbar region: Secondary | ICD-10-CM | POA: Diagnosis not present

## 2023-11-24 DIAGNOSIS — M545 Low back pain, unspecified: Secondary | ICD-10-CM | POA: Diagnosis not present

## 2023-11-24 DIAGNOSIS — M5126 Other intervertebral disc displacement, lumbar region: Secondary | ICD-10-CM | POA: Diagnosis not present

## 2023-11-24 DIAGNOSIS — R531 Weakness: Secondary | ICD-10-CM | POA: Diagnosis not present

## 2023-11-24 DIAGNOSIS — M5136 Other intervertebral disc degeneration, lumbar region with discogenic back pain only: Secondary | ICD-10-CM | POA: Diagnosis not present

## 2023-11-24 DIAGNOSIS — Z7401 Bed confinement status: Secondary | ICD-10-CM | POA: Diagnosis not present

## 2023-11-24 LAB — CBC WITH DIFFERENTIAL/PLATELET
Abs Immature Granulocytes: 0.02 K/uL (ref 0.00–0.07)
Basophils Absolute: 0 K/uL (ref 0.0–0.1)
Basophils Relative: 0 %
Eosinophils Absolute: 0 K/uL (ref 0.0–0.5)
Eosinophils Relative: 0 %
HCT: 35.9 % — ABNORMAL LOW (ref 39.0–52.0)
Hemoglobin: 11.6 g/dL — ABNORMAL LOW (ref 13.0–17.0)
Immature Granulocytes: 0 %
Lymphocytes Relative: 4 %
Lymphs Abs: 0.2 K/uL — ABNORMAL LOW (ref 0.7–4.0)
MCH: 28.2 pg (ref 26.0–34.0)
MCHC: 32.3 g/dL (ref 30.0–36.0)
MCV: 87.3 fL (ref 80.0–100.0)
Monocytes Absolute: 0.6 K/uL (ref 0.1–1.0)
Monocytes Relative: 10 %
Neutro Abs: 5.2 K/uL (ref 1.7–7.7)
Neutrophils Relative %: 86 %
Platelets: 252 K/uL (ref 150–400)
RBC: 4.11 MIL/uL — ABNORMAL LOW (ref 4.22–5.81)
RDW: 13.9 % (ref 11.5–15.5)
WBC: 6.1 K/uL (ref 4.0–10.5)
nRBC: 0 % (ref 0.0–0.2)

## 2023-11-24 LAB — URINALYSIS, ROUTINE W REFLEX MICROSCOPIC
Bacteria, UA: NONE SEEN
Bilirubin Urine: NEGATIVE
Glucose, UA: 50 mg/dL — AB
Hgb urine dipstick: NEGATIVE
Ketones, ur: NEGATIVE mg/dL
Leukocytes,Ua: NEGATIVE
Nitrite: NEGATIVE
Protein, ur: 30 mg/dL — AB
Specific Gravity, Urine: 1.017 (ref 1.005–1.030)
pH: 5 (ref 5.0–8.0)

## 2023-11-24 LAB — BASIC METABOLIC PANEL WITH GFR
Anion gap: 12 (ref 5–15)
BUN: 24 mg/dL — ABNORMAL HIGH (ref 8–23)
CO2: 26 mmol/L (ref 22–32)
Calcium: 8.8 mg/dL — ABNORMAL LOW (ref 8.9–10.3)
Chloride: 95 mmol/L — ABNORMAL LOW (ref 98–111)
Creatinine, Ser: 1.23 mg/dL (ref 0.61–1.24)
GFR, Estimated: >60 mL/min (ref >60)
Glucose, Bld: 135 mg/dL — ABNORMAL HIGH (ref 70–99)
Potassium: 4.1 mmol/L (ref 3.5–5.1)
Sodium: 133 mmol/L — ABNORMAL LOW (ref 135–145)

## 2023-11-24 LAB — TROPONIN I (HIGH SENSITIVITY): Troponin I (High Sensitivity): 21 ng/L — ABNORMAL HIGH (ref <18)

## 2023-11-24 LAB — CBG MONITORING, ED: Glucose-Capillary: 136 mg/dL — ABNORMAL HIGH (ref 70–99)

## 2023-11-24 NOTE — ED Provider Notes (Signed)
 [place] EMERGENCY DEPARTMENT AT [facility] Provider Note   CSN: [id number] Arrival date & time: 11/23/23  2354     Patient presents with: Back Pain   [name] is a 68 y.o. male with past medical history of type 2 diabetes, atrial fibrillation, oropharyngeal malignancy with chronic pleural effusion,  and prior stroke 14 years ago on anticoagulation, who presents to the emergency department via EMS for reported back pain.  However upon talking to the patient he states he is not in any pain and he came to the emergency department to give his roommate a break.  He states he has chronic left leg and lower back pain but does not want to take pain medication.  He denies recent falls.  He lives with a roommate who assist him with activities of daily living including getting dressed and undressed.    Back Pain      Prior to Admission medications   Medication Sig Start Date End Date Taking? Authorizing Provider  apixaban  (ELIQUIS ) 5 MG TABS tablet TAKE 1 TABLET BY MOUTH TWICE A DAY 11/07/23   Duanne Butler DASEN, MD  atorvastatin  (LIPITOR) 20 MG tablet TAKE 1 TABLET BY MOUTH EVERYDAY AT BEDTIME 11/21/23   Duanne Butler DASEN, MD  azelastine  (ASTELIN ) 0.1 % nasal spray Place 2 sprays into both nostrils 2 (two) times daily. Use in each nostril as directed 08/26/23   Duanne Butler DASEN, MD  Continuous Glucose Receiver (DEXCOM G7 RECEIVER) DEVI Use to continuously check blood sugar daily. 05/14/23   Duanne Butler DASEN, MD  Continuous Glucose Sensor (DEXCOM G7 SENSOR) MISC Use to continuously monitor glucose daily. Change meter every 10 days. 05/14/23   Duanne Butler DASEN, MD  diltiazem  (CARDIZEM  CD) 180 MG 24 hr capsule Take 1 capsule (180 mg total) by mouth daily. 07/22/23   Duanne Butler DASEN, MD  fluticasone  (FLONASE ) 50 MCG/ACT nasal spray Place 2 sprays into both nostrils daily. Patient not taking: Reported on 10/31/2023 07/22/23   Duanne Butler DASEN, MD  glipiZIDE  (GLUCOTROL  XL) 5 MG 24 hr  tablet Take 1 tablet (5 mg total) by mouth daily with breakfast. 11/05/23   Duanne Butler DASEN, MD  levETIRAcetam  (KEPPRA ) 500 MG tablet Take 1 tablet (500 mg total) by mouth 2 (two) times daily. 04/18/23 04/12/24  Camara, Amadou, MD  metoprolol  (TOPROL -XL) 200 MG 24 hr tablet Take 1 tablet (200 mg total) by mouth daily. 08/26/23   Duanne Butler DASEN, MD  pantoprazole  (PROTONIX ) 40 MG tablet Take 1 tablet (40 mg total) by mouth daily. 04/22/23   Duanne Butler DASEN, MD    Allergies: Codeine and Metformin  and related    Review of Systems  Musculoskeletal:  Positive for back pain.    Updated Vital Signs BP 109/76   Pulse 70   Temp 98.6 F (37 C) (Oral)   Resp 18   Ht 5' 9 (1.753 m)   Wt 73.9 kg   SpO2 94%   BMI 24.07 kg/m   Physical Exam Vitals and nursing note reviewed.  Constitutional:      Appearance: Normal appearance.  HENT:     Head: Normocephalic and atraumatic.     Mouth/Throat:     Mouth: Mucous membranes are moist.  Eyes:     General: No scleral icterus.       Right eye: No discharge.        Left eye: No discharge.     Conjunctiva/sclera: Conjunctivae normal.  Cardiovascular:     Rate  and Rhythm: Normal rate. Rhythm irregular.     Pulses: Normal pulses.     Comments: History of atrial fibrillation Pulmonary:     Effort: Pulmonary effort is normal.     Breath sounds: Normal breath sounds.  Abdominal:     General: There is no distension.     Tenderness: There is no abdominal tenderness.  Musculoskeletal:        General: No deformity.     Cervical back: Normal range of motion.  Skin:    General: Skin is warm and dry.     Capillary Refill: Capillary refill takes less than 2 seconds.  Neurological:     Mental Status: He is alert.     Motor: No weakness.     Comments: Patient appears to be oriented to baseline.  Longstanding weakness to left side of the body.  Able to lift right leg and left leg independently.  Left leg notably weaker  Psychiatric:        Mood and  Affect: Mood normal.     (all labs ordered are listed, but only abnormal results are displayed) Labs Reviewed  URINALYSIS, ROUTINE W REFLEX MICROSCOPIC - Abnormal; Notable for the following components:      Result Value   Glucose, UA 50 (*)    Protein, ur 30 (*)    All other components within normal limits  BASIC METABOLIC PANEL WITH GFR - Abnormal; Notable for the following components:   Sodium 133 (*)    Chloride 95 (*)    Glucose, Bld 135 (*)    BUN 24 (*)    Calcium  8.8 (*)    All other components within normal limits  CBC WITH DIFFERENTIAL/PLATELET - Abnormal; Notable for the following components:   RBC 4.11 (*)    Hemoglobin 11.6 (*)    HCT 35.9 (*)    Lymphs Abs 0.2 (*)    All other components within normal limits  CBG MONITORING, ED - Abnormal; Notable for the following components:   Glucose-Capillary 136 (*)    All other components within normal limits  TROPONIN I (HIGH SENSITIVITY) - Abnormal; Notable for the following components:   Troponin I (High Sensitivity) 21 (*)    All other components within normal limits    EKG: EKG Interpretation Date/Time:  Sunday November 24 2023 04:04:52 EDT Ventricular Rate:  112 PR Interval:    QRS Duration:  151 QT Interval:  428 QTC Calculation: 585 R Axis:   255  Text Interpretation: Atrial fibrillation with rapid ventricular response Right bundle branch block Inferolateral infarct, old Confirmed by Jerrol Agent ([id number]) on 11/24/2023 4:36:46 AM  Radiology: [facility] Chest 2 View Result Date: 11/24/2023 CLINICAL DATA:  Weakness. EXAM: CHEST - 2 VIEW COMPARISON:  Portable chest 02/27/2023, chest CT without contrast 09/02/2023. History of left lower lobe cancer with recurrence last year. FINDINGS: There is a large interval increased left pleural effusion with only the apical left lung remaining aerated. There is a minimal right pleural effusion. There is a roughly circular artifact projected over the left paratracheal space. The right  lung is clear. The cardiac size is obscured by the large left effusion but it was previously normal in size. The mediastinum appears grossly stable. There is aortic atherosclerosis. Thoracic cage is intact. There are degenerative changes of the left shoulder, grade 2 left AC separation. IMPRESSION: 1. Large interval increased left pleural effusion with only the apical left lung remaining aerated. 2. Minimal right pleural effusion. 3. Aortic atherosclerosis. 4. Grade  2 left AC separation. Electronically Signed   By: Francis Quam M.D.   On: 11/24/2023 03:55   DG Lumbar Spine Complete Result Date: 11/24/2023 EXAM: 4 VIEW(S) XRAY OF THE LUMBAR SPINE 11/24/2023 01:12:34 AM COMPARISON: None available. CLINICAL HISTORY: Back pain. EMS from home for lower right back pain with numbness down right leg (progressive x past few months, difficulty ambulating). Hx of stroke 14 years ago with deficits on left side. FINDINGS: LUMBAR SPINE: BONES: No acute fracture. Vertebral body heights are preserved. DISCS AND DEGENERATIVE CHANGES: Disc space narrowing and endplate remodeling at L2-L3 and L4-L5 in keeping with changes of advanced degenerative disc disease with minimal retrolisthesis at each level likely degenerative in nature. Remaining disc heights are preserved. 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 SOFT TISSUES: Advanced vascular calcifications noted. IMPRESSION: 1. No acute fracture of the lumbar spine. 2. Advanced degenerative disc disease at L2-3 and L4-5 with minimal retrolisthesis at each level, likely degenerative in nature. 3. 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 Electronically signed by: Dorethia Molt MD 11/24/2023 01:37 AM EDT RP Workstation: [id number]    Procedures   Medications Ordered in the ED - No data to display                                  Medical Decision Making Amount and/or Complexity of Data Reviewed Labs: ordered. Decision-making details documented in ED Course. Radiology: ordered.   This patient  presents to the ED for concern of back pain, this involves an extensive number of treatment options, and is a complaint that carries with it a high risk of complications and morbidity.  Differential diagnosis includes: Lytic lesions, compression fracture, cauda equina,  Co morbidities:  malignancy, A-fib, diabetes, stroke 14 years ago   Additional history:  Patient seen by palliative care back in August to establish healthcare wishes and completion of advance directives.  Education provided at that time.  Plan was to reevaluate patient in early October, however, patient did not show up to that appointment.  Lab Tests:  I Ordered, and personally interpreted labs.  The pertinent results include:    - CBG: 136  Imaging Studies:  I ordered imaging studies including lumbar x-ray I independently visualized and interpreted imaging which showed   1. No acute fracture of the lumbar spine.  2. Advanced degenerative disc disease at L2-3 and L4-5 with minimal  retrolisthesis at each level, likely degenerative in nature.  3. 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   I agree with the radiologist interpretation  Chest xray shows chronic left-sided pleural effusion  1. Large interval increased left pleural effusion with only the  apical left lung remaining aerated.  2. Minimal right pleural effusion.  3. Aortic atherosclerosis.  4. Grade 2 left AC separation.   I agree with the radiologist interpretation.  Cardiac Monitoring/ECG:  The patient was maintained on a cardiac monitor.  I personally viewed and interpreted the cardiac monitored which showed an underlying rhythm of: Atrial fibrillation  Medicines ordered and prescription drug management:  No medication indicated at this time.  Test Considered:   none  Critical Interventions:   none  Consultations Obtained: None  Problem List / ED Course:     ICD-10-CM   1. Weakness  R53.1       MDM: Patient is a 68 year old male with past  medical history of oropharyngeal malignancy, atrial fibrillation, type 2 diabetes, and prior stroke 14 years ago who presents to  the emergency department for evaluation of back pain.  Upon initial assessment, patient denied any complaints, and stated he was here to give his roommate a break.  However, he did report back pain that seemed to be new onset.  UA shows no evidence of UTI.  Lumbar x-ray was  showed no acute fracture of the lumbar spine.  No evidence of lytic lesions.  No bladder or bowel incontinence to suggest cauda equina or neuro involvement.  Patient declined pain medicine.  On reassessment, patient reported new weakness.  Patient has a history of atrial fibrillation, and his heart rate has been fluctuating between the low 100s to low 120s.  EKG shows atrial fibrillation.  Chest x-ray shows chronic left-sided pleural effusion. CBC, BMP and troponin ordered.  Initial troponin 21.  Patient denying chest pain so no indication for delta troponin.  Patient reports he is feeling better and would like to go home.  Patient stable for discharge at this time.   Dispostion:  After consideration of the diagnostic results and the patients response to treatment, I feel that the patient would benefit from outpatient follow-up with his oncologist.     Final diagnoses:  Weakness    ED Discharge Orders     None          Torrence Marry RAMAN, PA-C 11/24/23 [id number]    Jerrol Agent, MD 11/24/23 2312

## 2023-11-24 NOTE — ED Notes (Signed)
 Pt given urinal . Pt states he doesn't have to urinate at this time but will use urinal when he is able to.

## 2023-11-24 NOTE — Discharge Instructions (Addendum)
 It was a pleasure taking care of you today. You were seen in the Emergency Department for back pain and weakness. Your work-up was reassuring. Your lumbar xray showed no acute abnormality.  Your chest x-ray showed a chronic left-sided effusion.  Your urine is not infected.  No evidence of kidney infection.  Refer to the attached documentation for further management of your symptoms. Follow up with palliative care team if your symptoms continue.  Please return to the ER if you experience chest pain, trouble breathing, intractable nausea/vomiting or any other life threatening illnesses.

## 2023-12-02 ENCOUNTER — Telehealth: Payer: Self-pay | Admitting: Nurse Practitioner

## 2023-12-02 NOTE — Telephone Encounter (Signed)
 Inbound call from patient requesting a call to discuss esophageal stricture. Patient does not wish to wait until December to be seen. Please advise, thank you

## 2023-12-02 NOTE — Telephone Encounter (Signed)
 Attempted to reach patient to schedule an appointment. LVM Patient will need to be  scheduled with [facility] /Dr Shila

## 2023-12-03 NOTE — Telephone Encounter (Signed)
 Left message for patient to call back

## 2023-12-04 NOTE — Telephone Encounter (Signed)
 Following 2 failed attempts to speak with patient or get return phone call, we will await further correspondence from him before continuing additional efforts to reach him about his concerns.

## 2023-12-05 NOTE — Telephone Encounter (Signed)
 PT returning call to schedule for EGD. Patient does not want to wait until December. Please advise.

## 2023-12-06 NOTE — Telephone Encounter (Addendum)
 Left voicemail to call back.   Clerical staff-Patient can be scheduled with Dr Shila or [facility] APP for sooner appointment if an availability arises.

## 2023-12-08 NOTE — Progress Notes (Signed)
 Brian [name] [id number] 23-Mar-1955   Chief Complaint:  Referring Provider: Duanne Butler DASEN, MD Primary GI MD: Dr. Shila  HPI: Brian [name] is a 68 y.o. 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    Last seen in office 01/23/2021 by Elida Shawl, NP having been referred by oncology/radiology for further evaluation regarding abnormal surveillance PET scan 08/25/2020 which showed accentuated metabolic activity in the cecum.  2 small foci of metabolic activity noted in the liver thought to be artifact..  Follow-up PET scan 01/17/2021 did not show any abnormal activity in the abdomen or pelvis and the liver was unremarkable. Noted to be a challenging historian, at that time was accompanied by his son Beverley. Noted to have internal hemorrhoids on exam with Hemoccult negative stool.  Colonoscopy deferred.  Advised to use Desitin as needed.  Plan for colonoscopy if any active GI bleeding or labs showing iron deficiency anemia.  Continue surveillance PET scans per oncology. Noted to have chronic anemia likely due to CKD.  Admitted to the hospital 02/26/2023 to 03/07/2023 after presenting from skilled nursing facility with confusion and vomiting.  On presentation was bradycardic and hypotensive with rhythm and a flutter.  Started on vasopressors and admitted to ICU.  Cardiology was consulted and transvenous pacemaker was placed.  Hospital course also remarkable for coffee-ground emesis, anemia.  GI was consulted and EGD done 1/10 showed mild gastritis, duodenitis.  Suspected to have upper GI bleed and advised to continue Protonix .  Last visit  with oncology 10/15/2023. History of base of tongue cancer as well as lung cancer with lymph mets.    Seen by PCP 10/31/2023 and endorsed difficult swallowing, referred for possible dilation.   Here with friend  Last seizure? Last stroke? On Eliquis  - still taking Taking PPI once a day    Discussed the use of AI scribe software for clinical note transcription with the patient, who gave verbal consent to proceed.  History of Present Illness Brian [name] is a 68 year old male with throat cancer who presents with difficulty swallowing. He was referred by his primary care doctor for evaluation of swallowing difficulties.  He experiences difficulty swallowing since treatment for throat cancer in 2022. Solid foods often get stuck in his throat, while liquids are easier to swallow. He frequently regurgitates food to relieve the sensation of it being stuck. Drinking liquids causes his eyes to water and his nose to run. He denies heartburn or acid reflux.  He underwent chemotherapy and radiation for throat cancer, with radiation administered five days a week for seven weeks. He reports that his throat cancer moved to his lung, for which he has also received radiation treatment. He continues to be monitored by oncology.  He has regular bowel movements every two to three days, with a 'fudge' consistency. He uses Miralax to manage constipation, which he finds helpful. No blood in his stool or dark, tarry stools. He is currently taking Protonix  once daily and denies experiencing heartburn or reflux symptoms. His appetite remains good, but he struggles to consume food due to swallowing difficulties.      Previous GI Procedures/Imaging   EGD 03/01/2023 - Z- line regular, 40  cm from the incisors.  - No gross lesions in the entire esophagus.  - 3 cm hiatal hernia.  - Gastritis. Biopsied. No gastric outlet obstruction.  - No gross lesions in the first portion of the duodenum and in the second  portion of the duodenum.  Colonoscopy 08/12/2018 by Dr. Teressa: - One 3 mm polyp in the transverse colon, removed with a cold snare. Resected and retrieved. - External and internal hemorrhoids. - The examination was otherwise normal on direct and retroflexion views. - The bowel prep was good - Recall colonoscopy 5 years  Path: Surgical [P], colon, transverse, polyp - HYPERPLASTIC POLYP(S). - THERE IS NO EVIDENCE OF MALIGNANCY.   Colonoscopy March 2017 for routine risk screening found two tubular adenomas.  The largest was 15 mm.  These were both completely removed.  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 Alvia, laser therapy OD   Seizures [facility])    pt reports this is from low blood sugar   Throat cancer [facility])     Past Surgical History:  Procedure Laterality Date   BIOPSY  03/01/2023   Procedure: BIOPSY;  Surgeon: Shila Gustav GAILS, MD;  Location: [facility] ENDOSCOPY;  Service: Gastroenterology;;   BRONCHIAL NEEDLE ASPIRATION BIOPSY  10/30/2022   Procedure: BRONCHIAL NEEDLE ASPIRATION BIOPSIES;  Surgeon: Brenna Adine CROME, DO;  Location: [facility] ENDOSCOPY;  Service: Cardiopulmonary;;   COLONOSCOPY     DIRECT LARYNGOSCOPY N/A 02/10/2020   Procedure: DIRECT LARYNGOSCOPY WITH BIOPSY;  Surgeon: [name] Clunes, MD;  Location: [facility] OR;  Service: ENT;  Laterality: N/A;   Ear surgery as a child     ESOPHAGOGASTRODUODENOSCOPY N/A 03/01/2023   Procedure: ESOPHAGOGASTRODUODENOSCOPY (EGD);  Surgeon: Nandigam, Kavitha V, MD;  Location: [facility] ENDOSCOPY;  Service: Gastroenterology;  Laterality: N/A;    IR GASTROSTOMY TUBE MOD SED  03/23/2020   IR GASTROSTOMY TUBE REMOVAL  06/01/2020   IR IMAGING GUIDED PORT INSERTION  03/09/2020   IR REMOVAL TUN ACCESS W/ PORT W/O FL MOD SED  02/08/2021   TEMPORARY PACEMAKER N/A 02/26/2023   Procedure: TEMPORARY PACEMAKER;  Surgeon: Anner Alm ORN, MD;  Location: [facility] INVASIVE CV LAB;  Service: Cardiovascular;  Laterality: N/A;   THORACENTESIS  10/30/2022   Procedure: THORACENTESIS;  Surgeon: Brenna Adine CROME, DO;  Location: [facility] ENDOSCOPY;  Service: Cardiopulmonary;;  L pleural effusion   TONSILLECTOMY     VIDEO BRONCHOSCOPY Left 10/30/2022   Procedure: VIDEO BRONCHOSCOPY WITHOUT FLUORO;  Surgeon: Brenna Adine CROME, DO;  Location: [facility] ENDOSCOPY;  Service: Cardiopulmonary;  Laterality: Left;   VIDEO BRONCHOSCOPY WITH ENDOBRONCHIAL ULTRASOUND Bilateral 10/30/2022   Procedure: VIDEO BRONCHOSCOPY WITH ENDOBRONCHIAL ULTRASOUND;  Surgeon: Brenna Adine CROME, DO;  Location: [facility] ENDOSCOPY;  Service: Cardiopulmonary;  Laterality: Bilateral;    Current Outpatient Medications  Medication Sig Dispense Refill   apixaban  (ELIQUIS ) 5 MG TABS tablet TAKE 1 TABLET BY MOUTH TWICE A DAY 180 tablet 0   atorvastatin  (LIPITOR) 20 MG tablet TAKE 1 TABLET BY MOUTH EVERYDAY AT BEDTIME 90  tablet 0   azelastine  (ASTELIN ) 0.1 % nasal spray Place 2 sprays into both nostrils 2 (two) times daily. Use in each nostril as directed 30 mL 12   Continuous Glucose Receiver (DEXCOM G7 RECEIVER) DEVI Use to continuously check blood sugar daily. 1 each 1   Continuous Glucose Sensor (DEXCOM G7 SENSOR) MISC Use to continuously monitor glucose daily. Change meter every 10 days. 3 each 5   diltiazem  (CARDIZEM  CD) 180 MG 24 hr capsule Take 1 capsule (180 mg total) by mouth daily. 90 capsule 3   fluticasone  (FLONASE ) 50 MCG/ACT nasal spray Place 2 sprays into both nostrils daily. 16 g 6   glipiZIDE  (GLUCOTROL  XL) 5 MG 24 hr tablet Take 1 tablet (5 mg total) by mouth daily with breakfast. 90 tablet 3    levETIRAcetam  (KEPPRA ) 500 MG tablet Take 1 tablet (500 mg total) by mouth 2 (two) times daily. 180 tablet 3   metoprolol  (TOPROL -XL) 200 MG 24 hr tablet Take 1 tablet (200 mg total) by mouth daily. 90 tablet 3   pantoprazole  (PROTONIX ) 40 MG tablet Take 1 tablet (40 mg total) by mouth daily. 90 tablet 3   No current facility-administered medications for this visit.    Allergies as of 12/09/2023 - Review Complete 12/09/2023  Allergen Reaction Noted   Codeine Other (See Comments)    Metformin  and related Diarrhea 01/31/2021    Family History  Problem Relation Age of Onset   Colon cancer Mother    Hyperlipidemia Father    Hypertension Father    Breast cancer Sister    Coronary artery disease Neg Hx        no early CAD.   Stomach cancer Neg Hx    Rectal cancer Neg Hx    Esophageal cancer Neg Hx     Social History   Tobacco Use   Smoking status: Never    Passive exposure: Never   Smokeless tobacco: Never   Tobacco comments:    Verified by son, Tavaras Goody  Vaping Use   Vaping status: Never Used  Substance Use Topics   Alcohol use: Yes    Alcohol/week: 0.0 standard drinks of alcohol    Comment: seldom   Drug use: Not Currently    Frequency: 7.0 times per week    Types: Marijuana    Comment: marijauna for arthritis, last 03-13-20     Review of Systems:    Constitutional: No weight loss, fever, chills, weakness or fatigue Eyes: No change in vision Ears, Nose, Throat:  No change in hearing or congestion Skin: No rash or itching Cardiovascular: No chest pain, chest pressure or palpitations   Respiratory: No SOB or cough Gastrointestinal: See HPI and otherwise negative Genitourinary: No dysuria or change in urinary frequency Neurological: No headache, dizziness or syncope Musculoskeletal: No new muscle or joint pain Hematologic: No bleeding or bruising    Physical Exam:  Vital signs: BP (!) 140/100 (BP Location: Left Arm, Patient Position: Sitting)   Pulse 81  Comment: irregular  Ht 5' 5 (1.651 m)   Wt 154 lb 8 oz (70.1 kg)   BMI 25.71 kg/m   Wt Readings from Last 3 Encounters:  12/09/23 154 lb 8 oz (70.1 kg)  11/24/23 163 lb (73.9 kg)  10/31/23 156 lb 9.6 oz (71 kg)     Constitutional: NAD, Well developed, Well nourished, alert and cooperative Head:  Normocephalic and atraumatic.  Eyes: No scleral icterus. Conjunctiva pink. Mouth: No oral lesions. Respiratory: Respirations even and unlabored. Lungs  clear to auscultation bilaterally.  No wheezes, crackles, or rhonchi.  Cardiovascular:  Regular rate and rhythm. No murmurs. No peripheral edema. Gastrointestinal:  Soft, nondistended, nontender. No rebound or guarding. Normal bowel sounds. No appreciable masses or hepatomegaly. Rectal:  Not performed.  Neurologic:  Alert and oriented x4;  grossly normal neurologically.  Skin:   Dry and intact without significant lesions or rashes. Psychiatric: Oriented to person, place and time. Demonstrates good judgement and reason without abnormal affect or behaviors.   RELEVANT LABS AND IMAGING: CBC    Component Value Date/Time   WBC 6.1 11/24/2023 0334   RBC 4.11 (L) 11/24/2023 0334   HGB 11.6 (L) 11/24/2023 0334   HGB 11.9 (L) 09/16/2020 1540   HCT 35.9 (L) 11/24/2023 0334   PLT 252 11/24/2023 0334   PLT 161 09/16/2020 1540   MCV 87.3 11/24/2023 0334   MCH 28.2 11/24/2023 0334   MCHC 32.3 11/24/2023 0334   RDW 13.9 11/24/2023 0334   LYMPHSABS 0.2 (L) 11/24/2023 0334   MONOABS 0.6 11/24/2023 0334   EOSABS 0.0 11/24/2023 0334   BASOSABS 0.0 11/24/2023 0334    CMP     Component Value Date/Time   NA 133 (L) 11/24/2023 0334   NA 139 09/01/2021 1014   K 4.1 11/24/2023 0334   CL 95 (L) 11/24/2023 0334   CO2 26 11/24/2023 0334   GLUCOSE 135 (H) 11/24/2023 0334   BUN 24 (H) 11/24/2023 0334   BUN 17 09/01/2021 1014   CREATININE 1.23 11/24/2023 0334   CREATININE 1.73 (H) 10/31/2023 1227   CALCIUM  8.8 (L) 11/24/2023 0334   PROT 6.5  10/31/2023 1227   ALBUMIN 2.7 (L) 03/03/2023 0218   AST 8 (L) 10/31/2023 1227   AST 14 (L) 03/20/2021 0909   ALT 6 (L) 10/31/2023 1227   ALT 13 03/20/2021 0909   ALKPHOS 60 02/26/2023 1551   BILITOT 0.5 10/31/2023 1227   BILITOT 0.3 03/20/2021 0909   GFRNONAA >60 11/24/2023 0334   GFRNONAA 45 (L) 01/01/2023 1020   GFRNONAA 61 11/30/2019 0915   GFRAA 70 11/30/2019 0915    Echocardiogram 02/26/2023 1. Cannot fully assess wall motion. Left ventricular ejection fraction, by estimation, is 60 to 65% . The left ventricle has normal function. There is moderate concentric left ventricular hypertrophy. Left ventricular diastolic parameters are indeterminate.  2. Right ventricular systolic function is mildly reduced. The right ventricular size is mildly enlarged. Tricuspid regurgitation signal is inadequate for assessing PA pressure.  3. Left atrial size was moderately dilated.  4. Right atrial size was mildly dilated.  5. The mitral valve was not well visualized. No evidence of mitral valve regurgitation.  6. The aortic valve was not well visualized. Aortic valve regurgitation is not visualized.  7. The inferior vena cava is dilated in size with > 50% respiratory variability, suggesting right atrial pressure of 8 mmHg.   Assessment/Plan:   A. Fib Poor dentition  He is due for repeat colonoscopy? Priority is EGD w/ dilation Reports history of recent falls Discuss with Nandigam and Norleen Schillings to determine if okay for LEC  Given his history and recent falls, weakness, other medical conditions, plan for EGD in hospital setting  Request Eliquis  hold  Update CBC, BMP  Had to leave before scheduling, could not have labs today  Will call to give information   Camie Furbish, PA-C [facility] 12/09/2023, 11:31 AM  Patient Care Team: Duanne Butler DASEN, MD as PCP - General (Family Medicine) Lavona Agent, MD as  PCP - Cardiology (Cardiology) [name], Delon CROME, RN as  Oncology Nurse Navigator Izell Domino, MD as Consulting Physician (Radiation Oncology) Loretha Ash, MD as Consulting Physician (Hematology and Oncology) Daryle Heron CROME, RD as Dietitian (Nutrition) [name], Lupita NOVAK, [name] as Speech Language Pathologist (Speech Pathology) Lanis Carbon, Florina CROME, PT as Physical Therapist (Physical Therapy) Stacia Arlean BROCKS, [name] (Inactive) as Social Worker (General Practice) [name] Clunes, MD as Consulting Physician (Otolaryngology) Nicholaus Sherlean CROME, [facility] (Inactive) as Pharmacist (Pharmacist) Lavona Agent, MD as Consulting Physician (Cardiology)

## 2023-12-09 ENCOUNTER — Encounter: Payer: Self-pay | Admitting: Gastroenterology

## 2023-12-09 ENCOUNTER — Ambulatory Visit (INDEPENDENT_AMBULATORY_CARE_PROVIDER_SITE_OTHER): Admitting: Gastroenterology

## 2023-12-09 ENCOUNTER — Telehealth: Payer: Self-pay

## 2023-12-09 VITALS — BP 142/100 | HR 81 | Ht 65.0 in | Wt 154.5 lb

## 2023-12-09 DIAGNOSIS — C01 Malignant neoplasm of base of tongue: Secondary | ICD-10-CM | POA: Diagnosis not present

## 2023-12-09 DIAGNOSIS — R1319 Other dysphagia: Secondary | ICD-10-CM

## 2023-12-09 DIAGNOSIS — K219 Gastro-esophageal reflux disease without esophagitis: Secondary | ICD-10-CM

## 2023-12-09 DIAGNOSIS — Z7901 Long term (current) use of anticoagulants: Secondary | ICD-10-CM | POA: Diagnosis not present

## 2023-12-09 DIAGNOSIS — C3432 Malignant neoplasm of lower lobe, left bronchus or lung: Secondary | ICD-10-CM | POA: Diagnosis not present

## 2023-12-09 DIAGNOSIS — D649 Anemia, unspecified: Secondary | ICD-10-CM

## 2023-12-09 NOTE — Telephone Encounter (Signed)
 Letter faxed to PCP in regard to holding Eliquis  2 days prior to 02/04/24 EGD @ [facility].

## 2023-12-09 NOTE — Telephone Encounter (Signed)
 Patient has appointment today with S.Heinz, PA-C.

## 2023-12-09 NOTE — Patient Instructions (Addendum)
 Follow up with PCP for recent falls.   You will be contacted by our office prior to your procedure for directions on holding your Eliquis .  If you do not hear from our office 1 week prior to your scheduled procedure, please call [phone] to discuss.    You have been scheduled for an endoscopy. Please follow written instructions given to you at your visit today.  If you use inhalers (even only as needed), please bring them with you on the day of your procedure.  If you take any of the following medications, they will need to be adjusted prior to your procedure:   DO NOT TAKE 7 DAYS PRIOR TO TEST- Trulicity  (dulaglutide ) Ozempic, Wegovy (semaglutide) Mounjaro (tirzepatide) Bydureon Bcise (exanatide extended release)  DO NOT TAKE 1 DAY PRIOR TO YOUR TEST Rybelsus (semaglutide) Adlyxin (lixisenatide) Victoza (liraglutide) Byetta (exanatide) ___________________________________________________________________________  _______________________________________________________  If your blood pressure at your visit was 140/90 or greater, please contact your primary care physician to follow up on this.  _______________________________________________________  If you are age 70 or older, your body mass index should be between 23-30. Your Body mass index is 25.71 kg/m. If this is out of the aforementioned range listed, please consider follow up with your Primary Care Provider.  If you are age 70 or younger, your body mass index should be between 19-25. Your Body mass index is 25.71 kg/m. If this is out of the aformentioned range listed, please consider follow up with your Primary Care Provider.   ________________________________________________________  The [facility] providers would like to encourage you to use MYCHART to communicate with providers for non-urgent requests or questions.  Due to long hold times on the telephone, sending your provider a message by [facility] may be a faster and  more efficient way to get a response.  Please allow 48 business hours for a response.  Please remember that this is for non-urgent requests.  _______________________________________________________  [facility] is using a team-based approach to care.  Your team is made up of your doctor and two to three APPS. Our APPS (Nurse Practitioners and Physician Assistants) work with your physician to ensure care continuity for you. They are fully qualified to address your health concerns and develop a treatment plan. They communicate directly with your gastroenterologist to care for you. Seeing the Advanced Practice Practitioners on your physician's team can help you by facilitating care more promptly, often allowing for earlier appointments, access to diagnostic testing, procedures, and other specialty referrals.

## 2023-12-10 ENCOUNTER — Telehealth: Payer: Self-pay

## 2023-12-10 ENCOUNTER — Other Ambulatory Visit: Payer: Self-pay | Admitting: Family Medicine

## 2023-12-10 ENCOUNTER — Encounter: Payer: Self-pay | Admitting: Gastroenterology

## 2023-12-10 NOTE — Telephone Encounter (Signed)
 Spoke with pt and informed him to hold his eliquis  2 days before his procedure. He states he will not remember and asked to have a phone call reminder a couple days before. Clinical reminder placed to call him on 01/31/24.

## 2023-12-10 NOTE — Telephone Encounter (Signed)
-----   Message from Butler ONEIDA Burr sent at 12/09/2023 12:44 PM EDT ----- Patient can hold eliquis  two days before procedure as requested. Thanks Charlena Burr ----- Message ----- From: Alwin Corean NOVAK, CMA Sent: 12/09/2023  12:35 PM EDT To: Butler ONEIDA Burr, MD

## 2023-12-10 NOTE — Telephone Encounter (Unsigned)
 Copied from CRM [id number]. Topic: Clinical - Medication Refill >> Dec 10, 2023  3:41 PM Amy B wrote: Medication:  diltiazem  (CARDIZEM  CD) 180 MG 24 hr capsule  Has the patient contacted their pharmacy? Yes (Agent: If no, request that the patient contact the pharmacy for the refill. If patient does not wish to contact the pharmacy document the reason why and proceed with request.) (Agent: If yes, when and what did the pharmacy advise?)  This is the patient's preferred pharmacy:  [id number] [place], [place] - [address] [place] [place] [address] Phone: [phone] Fax: [phone]  Is this the correct pharmacy for this prescription? Yes If no, delete pharmacy and type the correct one.   Has the prescription been filled recently? No  Is the patient out of the medication? Yes  Has the patient been seen for an appointment in the last year OR does the patient have an upcoming appointment? Yes  Can we respond through MyChart? No  Agent: Please be advised that Rx refills may take up to 3 business days. We ask that you follow-up with your pharmacy.

## 2023-12-12 NOTE — Telephone Encounter (Signed)
 Requested Prescriptions  Refused Prescriptions Disp Refills   diltiazem  (CARDIZEM  CD) 180 MG 24 hr capsule 90 capsule 3    Sig: Take 1 capsule (180 mg total) by mouth daily.     Cardiovascular: Calcium  Channel Blockers 3 Failed - 12/12/2023  1:23 PM      Failed - ALT in normal range and within 360 days    ALT  Date Value Ref Range Status  10/31/2023 6 (L) 9 - 46 U/L Final  03/20/2021 13 0 - 44 U/L Final         Failed - AST in normal range and within 360 days    AST  Date Value Ref Range Status  10/31/2023 8 (L) 10 - 35 U/L Final  03/20/2021 14 (L) 15 - 41 U/L Final         Failed - Last BP in normal range    BP Readings from Last 1 Encounters:  12/11/23 (!) 142/84         Passed - Cr in normal range and within 360 days    Creat  Date Value Ref Range Status  10/31/2023 1.73 (H) 0.70 - 1.28 mg/dL Final   Creatinine, Ser  Date Value Ref Range Status  11/24/2023 1.23 0.61 - 1.24 mg/dL Final   Creatinine, Urine  Date Value Ref Range Status  12/12/2009 226.8 mg/dL Final         Passed - Last Heart Rate in normal range    Pulse Readings from Last 1 Encounters:  12/09/23 81         Passed - Valid encounter within last 6 months    Recent Outpatient Visits           1 month ago Esophageal stricture   [place] [facility] Duanne Butler DASEN, MD   2 months ago Persistent atrial fibrillation [facility])   [place] [facility] Duanne Butler DASEN, MD   3 months ago Persistent atrial fibrillation [facility])   [place] [facility] Duanne, Butler DASEN, MD   4 months ago Upper GI bleed   [place] [facility] Duanne Butler DASEN, MD   7 months ago Atrial fibrillation, unspecified type [facility])   [place] [facility] Pickard, Butler DASEN, MD

## 2023-12-13 ENCOUNTER — Telehealth: Payer: Self-pay | Admitting: Family Medicine

## 2023-12-13 ENCOUNTER — Other Ambulatory Visit: Payer: Self-pay

## 2023-12-13 DIAGNOSIS — I480 Paroxysmal atrial fibrillation: Secondary | ICD-10-CM

## 2023-12-13 DIAGNOSIS — I4819 Other persistent atrial fibrillation: Secondary | ICD-10-CM

## 2023-12-13 DIAGNOSIS — I6350 Cerebral infarction due to unspecified occlusion or stenosis of unspecified cerebral artery: Secondary | ICD-10-CM

## 2023-12-13 DIAGNOSIS — I509 Heart failure, unspecified: Secondary | ICD-10-CM

## 2023-12-13 MED ORDER — DILTIAZEM HCL ER COATED BEADS 180 MG PO CP24: 180.0000 mg | ORAL_CAPSULE | Freq: Every day | ORAL | 3 refills | Status: DC

## 2023-12-13 NOTE — Telephone Encounter (Signed)
 Copied from CRM [id number]. Topic: Clinical - Prescription Issue >> Dec 13, 2023  3:37 PM Fonda T wrote: Reason for CRM: Patient calling to check status of medication refill of diltiazem  (CARDIZEM  CD) 180 MG 24 hr capsule.  Patient requested originally on 12/10/23.  Per patient he needs medication prior to weekend, because he is completley out.  Called and spoke to front office, Nanette, states will send to nurse for completion. Advised to inform patient to Check with pharmacy in about 1 hour for status.  Patient informed of above, requesting a follow up call when sent to pharmacy, can be reached at [phone].   Preferred pharmacy: [id number] [place], [place] - [address] [place] [address] Phone: [phone] Fax: [phone]

## 2023-12-19 ENCOUNTER — Telehealth: Payer: Self-pay | Admitting: Nurse Practitioner

## 2023-12-19 NOTE — Telephone Encounter (Signed)
 I reached out to patient to schedule palliative care appointment and he declined, requesting I give him a call back in a few days.

## 2023-12-23 ENCOUNTER — Telehealth: Payer: Self-pay | Admitting: Nurse Practitioner

## 2023-12-23 NOTE — Telephone Encounter (Signed)
 I reached out to patient to schedule palliative care appointment per his request when I spoke with him 1 week ago. I left a voicemail for him to return my call at his earliest convenience so we can get him scheduled.

## 2023-12-24 ENCOUNTER — Telehealth: Payer: Self-pay | Admitting: Nurse Practitioner

## 2023-12-24 NOTE — Telephone Encounter (Signed)
 I called patient and was unable to leave voicemail. I have tried scheduling a palliative care appointment for about 2 weeks. When I spoke with patient last week, he requested I give him a call back and that attempt was unsuccessful.

## 2023-12-26 ENCOUNTER — Emergency Department (HOSPITAL_COMMUNITY)

## 2023-12-26 ENCOUNTER — Telehealth: Payer: Self-pay | Admitting: Family Medicine

## 2023-12-26 ENCOUNTER — Telehealth: Payer: Self-pay

## 2023-12-26 ENCOUNTER — Inpatient Hospital Stay (HOSPITAL_COMMUNITY)
Admission: EM | Admit: 2023-12-26 | Discharge: 2024-01-20 | DRG: 180 | Disposition: E | Attending: Internal Medicine | Admitting: Internal Medicine

## 2023-12-26 DIAGNOSIS — C782 Secondary malignant neoplasm of pleura: Secondary | ICD-10-CM | POA: Diagnosis present

## 2023-12-26 DIAGNOSIS — D72819 Decreased white blood cell count, unspecified: Secondary | ICD-10-CM | POA: Diagnosis present

## 2023-12-26 DIAGNOSIS — Z83438 Family history of other disorder of lipoprotein metabolism and other lipidemia: Secondary | ICD-10-CM

## 2023-12-26 DIAGNOSIS — Z66 Do not resuscitate: Secondary | ICD-10-CM | POA: Diagnosis present

## 2023-12-26 DIAGNOSIS — C140 Malignant neoplasm of pharynx, unspecified: Secondary | ICD-10-CM | POA: Diagnosis present

## 2023-12-26 DIAGNOSIS — C029 Malignant neoplasm of tongue, unspecified: Secondary | ICD-10-CM | POA: Diagnosis present

## 2023-12-26 DIAGNOSIS — I4821 Permanent atrial fibrillation: Secondary | ICD-10-CM | POA: Diagnosis present

## 2023-12-26 DIAGNOSIS — E113599 Type 2 diabetes mellitus with proliferative diabetic retinopathy without macular edema, unspecified eye: Secondary | ICD-10-CM | POA: Diagnosis present

## 2023-12-26 DIAGNOSIS — S0990XA Unspecified injury of head, initial encounter: Secondary | ICD-10-CM | POA: Diagnosis present

## 2023-12-26 DIAGNOSIS — R627 Adult failure to thrive: Secondary | ICD-10-CM | POA: Diagnosis present

## 2023-12-26 DIAGNOSIS — C7800 Secondary malignant neoplasm of unspecified lung: Secondary | ICD-10-CM | POA: Diagnosis not present

## 2023-12-26 DIAGNOSIS — I4891 Unspecified atrial fibrillation: Secondary | ICD-10-CM | POA: Diagnosis not present

## 2023-12-26 DIAGNOSIS — Z1152 Encounter for screening for COVID-19: Secondary | ICD-10-CM

## 2023-12-26 DIAGNOSIS — Z7984 Long term (current) use of oral hypoglycemic drugs: Secondary | ICD-10-CM

## 2023-12-26 DIAGNOSIS — J90 Pleural effusion, not elsewhere classified: Principal | ICD-10-CM

## 2023-12-26 DIAGNOSIS — R1310 Dysphagia, unspecified: Secondary | ICD-10-CM | POA: Diagnosis present

## 2023-12-26 DIAGNOSIS — N1832 Chronic kidney disease, stage 3b: Secondary | ICD-10-CM | POA: Diagnosis present

## 2023-12-26 DIAGNOSIS — E1165 Type 2 diabetes mellitus with hyperglycemia: Secondary | ICD-10-CM | POA: Diagnosis present

## 2023-12-26 DIAGNOSIS — G40909 Epilepsy, unspecified, not intractable, without status epilepticus: Secondary | ICD-10-CM | POA: Diagnosis present

## 2023-12-26 DIAGNOSIS — E872 Acidosis, unspecified: Secondary | ICD-10-CM | POA: Diagnosis present

## 2023-12-26 DIAGNOSIS — Z515 Encounter for palliative care: Secondary | ICD-10-CM

## 2023-12-26 DIAGNOSIS — R0603 Acute respiratory distress: Secondary | ICD-10-CM

## 2023-12-26 DIAGNOSIS — S80212A Abrasion, left knee, initial encounter: Secondary | ICD-10-CM | POA: Diagnosis present

## 2023-12-26 DIAGNOSIS — Z5982 Transportation insecurity: Secondary | ICD-10-CM

## 2023-12-26 DIAGNOSIS — Z5320 Procedure and treatment not carried out because of patient's decision for unspecified reasons: Secondary | ICD-10-CM | POA: Diagnosis present

## 2023-12-26 DIAGNOSIS — E1122 Type 2 diabetes mellitus with diabetic chronic kidney disease: Secondary | ICD-10-CM | POA: Diagnosis present

## 2023-12-26 DIAGNOSIS — Z9221 Personal history of antineoplastic chemotherapy: Secondary | ICD-10-CM

## 2023-12-26 DIAGNOSIS — D631 Anemia in chronic kidney disease: Secondary | ICD-10-CM | POA: Diagnosis present

## 2023-12-26 DIAGNOSIS — I130 Hypertensive heart and chronic kidney disease with heart failure and stage 1 through stage 4 chronic kidney disease, or unspecified chronic kidney disease: Secondary | ICD-10-CM | POA: Diagnosis present

## 2023-12-26 DIAGNOSIS — C7931 Secondary malignant neoplasm of brain: Secondary | ICD-10-CM | POA: Diagnosis present

## 2023-12-26 DIAGNOSIS — J910 Malignant pleural effusion: Secondary | ICD-10-CM | POA: Diagnosis present

## 2023-12-26 DIAGNOSIS — I472 Ventricular tachycardia, unspecified: Secondary | ICD-10-CM | POA: Diagnosis present

## 2023-12-26 DIAGNOSIS — Z803 Family history of malignant neoplasm of breast: Secondary | ICD-10-CM

## 2023-12-26 DIAGNOSIS — I2699 Other pulmonary embolism without acute cor pulmonale: Secondary | ICD-10-CM | POA: Diagnosis present

## 2023-12-26 DIAGNOSIS — Z79899 Other long term (current) drug therapy: Secondary | ICD-10-CM

## 2023-12-26 DIAGNOSIS — Z7901 Long term (current) use of anticoagulants: Secondary | ICD-10-CM

## 2023-12-26 DIAGNOSIS — Z800 Family history of malignant neoplasm of digestive organs: Secondary | ICD-10-CM

## 2023-12-26 DIAGNOSIS — Z888 Allergy status to other drugs, medicaments and biological substances status: Secondary | ICD-10-CM

## 2023-12-26 DIAGNOSIS — Z923 Personal history of irradiation: Secondary | ICD-10-CM

## 2023-12-26 DIAGNOSIS — R000 Tachycardia, unspecified: Secondary | ICD-10-CM

## 2023-12-26 DIAGNOSIS — Z8249 Family history of ischemic heart disease and other diseases of the circulatory system: Secondary | ICD-10-CM

## 2023-12-26 DIAGNOSIS — G936 Cerebral edema: Secondary | ICD-10-CM | POA: Diagnosis present

## 2023-12-26 DIAGNOSIS — W010XXA Fall on same level from slipping, tripping and stumbling without subsequent striking against object, initial encounter: Secondary | ICD-10-CM | POA: Diagnosis present

## 2023-12-26 DIAGNOSIS — I69354 Hemiplegia and hemiparesis following cerebral infarction affecting left non-dominant side: Secondary | ICD-10-CM

## 2023-12-26 DIAGNOSIS — Z885 Allergy status to narcotic agent status: Secondary | ICD-10-CM

## 2023-12-26 DIAGNOSIS — Z604 Social exclusion and rejection: Secondary | ICD-10-CM | POA: Diagnosis present

## 2023-12-26 DIAGNOSIS — Z638 Other specified problems related to primary support group: Secondary | ICD-10-CM

## 2023-12-26 DIAGNOSIS — E785 Hyperlipidemia, unspecified: Secondary | ICD-10-CM | POA: Diagnosis present

## 2023-12-26 DIAGNOSIS — Z6825 Body mass index (BMI) 25.0-25.9, adult: Secondary | ICD-10-CM

## 2023-12-26 DIAGNOSIS — J9601 Acute respiratory failure with hypoxia: Secondary | ICD-10-CM | POA: Diagnosis present

## 2023-12-26 DIAGNOSIS — R54 Age-related physical debility: Secondary | ICD-10-CM | POA: Diagnosis present

## 2023-12-26 DIAGNOSIS — I429 Cardiomyopathy, unspecified: Secondary | ICD-10-CM | POA: Diagnosis present

## 2023-12-26 DIAGNOSIS — I5042 Chronic combined systolic (congestive) and diastolic (congestive) heart failure: Secondary | ICD-10-CM | POA: Diagnosis present

## 2023-12-26 DIAGNOSIS — E43 Unspecified severe protein-calorie malnutrition: Secondary | ICD-10-CM | POA: Diagnosis present

## 2023-12-26 LAB — CBC WITH DIFFERENTIAL/PLATELET
Abs Immature Granulocytes: 0.02 K/uL (ref 0.00–0.07)
Basophils Absolute: 0 K/uL (ref 0.0–0.1)
Basophils Relative: 0 %
Eosinophils Absolute: 0 K/uL (ref 0.0–0.5)
Eosinophils Relative: 0 %
HCT: 34.2 % — ABNORMAL LOW (ref 39.0–52.0)
Hemoglobin: 10.2 g/dL — ABNORMAL LOW (ref 13.0–17.0)
Immature Granulocytes: 0 %
Lymphocytes Relative: 2 %
Lymphs Abs: 0.1 K/uL — ABNORMAL LOW (ref 0.7–4.0)
MCH: 27.6 pg (ref 26.0–34.0)
MCHC: 29.8 g/dL — ABNORMAL LOW (ref 30.0–36.0)
MCV: 92.4 fL (ref 80.0–100.0)
Monocytes Absolute: 0.4 K/uL (ref 0.1–1.0)
Monocytes Relative: 8 %
Neutro Abs: 4.5 K/uL (ref 1.7–7.7)
Neutrophils Relative %: 90 %
Platelets: 272 K/uL (ref 150–400)
RBC: 3.7 MIL/uL — ABNORMAL LOW (ref 4.22–5.81)
RDW: 15.3 % (ref 11.5–15.5)
WBC: 5 K/uL (ref 4.0–10.5)
nRBC: 0 % (ref 0.0–0.2)

## 2023-12-26 LAB — COMPREHENSIVE METABOLIC PANEL WITH GFR
ALT: 22 U/L (ref 0–44)
AST: 21 U/L (ref 15–41)
Albumin: 2.3 g/dL — ABNORMAL LOW (ref 3.5–5.0)
Alkaline Phosphatase: 111 U/L (ref 38–126)
Anion gap: 15 (ref 5–15)
BUN: 31 mg/dL — ABNORMAL HIGH (ref 8–23)
CO2: 21 mmol/L — ABNORMAL LOW (ref 22–32)
Calcium: 8.6 mg/dL — ABNORMAL LOW (ref 8.9–10.3)
Chloride: 104 mmol/L (ref 98–111)
Creatinine, Ser: 1.31 mg/dL — ABNORMAL HIGH (ref 0.61–1.24)
GFR, Estimated: 59 mL/min — ABNORMAL LOW (ref >60)
Glucose, Bld: 165 mg/dL — ABNORMAL HIGH (ref 70–99)
Potassium: 4.2 mmol/L (ref 3.5–5.1)
Sodium: 140 mmol/L (ref 135–145)
Total Bilirubin: 1.2 mg/dL (ref 0.0–1.2)
Total Protein: 6.3 g/dL — ABNORMAL LOW (ref 6.5–8.1)

## 2023-12-26 LAB — I-STAT CG4 LACTIC ACID, ED: Lactic Acid, Venous: 2.7 mmol/L (ref 0.5–1.9)

## 2023-12-26 LAB — RESP PANEL BY RT-PCR (RSV, FLU A&B, COVID)  RVPGX2
Influenza A by PCR: NEGATIVE
Influenza B by PCR: NEGATIVE
Resp Syncytial Virus by PCR: NEGATIVE
SARS Coronavirus 2 by RT PCR: NEGATIVE

## 2023-12-26 LAB — I-STAT VENOUS BLOOD GAS, ED
Acid-Base Excess: 1 mmol/L (ref 0.0–2.0)
Bicarbonate: 23.3 mmol/L (ref 20.0–28.0)
Calcium, Ion: 1.04 mmol/L — ABNORMAL LOW (ref 1.15–1.40)
HCT: 32 % — ABNORMAL LOW (ref 39.0–52.0)
Hemoglobin: 10.9 g/dL — ABNORMAL LOW (ref 13.0–17.0)
O2 Saturation: 99 %
Potassium: 4.8 mmol/L (ref 3.5–5.1)
Sodium: 137 mmol/L (ref 135–145)
TCO2: 24 mmol/L (ref 22–32)
pCO2, Ven: 29.1 mmHg — ABNORMAL LOW (ref 44–60)
pH, Ven: 7.511 — ABNORMAL HIGH (ref 7.25–7.43)
pO2, Ven: 117 mmHg — ABNORMAL HIGH (ref 32–45)

## 2023-12-26 LAB — TROPONIN I (HIGH SENSITIVITY): Troponin I (High Sensitivity): 42 ng/L — ABNORMAL HIGH (ref <18)

## 2023-12-26 LAB — BRAIN NATRIURETIC PEPTIDE: B Natriuretic Peptide: 991.1 pg/mL — ABNORMAL HIGH (ref 0.0–100.0)

## 2023-12-26 LAB — MAGNESIUM: Magnesium: 2 mg/dL (ref 1.7–2.4)

## 2023-12-26 MED ORDER — VANCOMYCIN HCL IN DEXTROSE 1-5 GM/200ML-% IV SOLN: 1000.0000 mg | Freq: Once | INTRAVENOUS | Status: DC

## 2023-12-26 MED ORDER — PIPERACILLIN-TAZOBACTAM 3.375 G IVPB 30 MIN
3.3750 g | Freq: Once | INTRAVENOUS | Status: AC
  Administered 2023-12-26: 3.375 g via INTRAVENOUS
  Filled 2023-12-26: qty 50

## 2023-12-26 MED ORDER — VANCOMYCIN HCL 1250 MG/250ML IV SOLN
1250.0000 mg | Freq: Once | INTRAVENOUS | Status: AC
  Administered 2023-12-26: 1250 mg via INTRAVENOUS
  Filled 2023-12-26: qty 250

## 2023-12-26 MED ORDER — AMIODARONE HCL IN DEXTROSE 360-4.14 MG/200ML-% IV SOLN
30.0000 mg/h | INTRAVENOUS | Status: DC
  Administered 2023-12-27: 30 mg/h via INTRAVENOUS
  Filled 2023-12-26 (×2): qty 200

## 2023-12-26 MED ORDER — SODIUM CHLORIDE 0.9 % IV BOLUS
500.0000 mL | Freq: Once | INTRAVENOUS | Status: AC
  Administered 2023-12-26: 500 mL via INTRAVENOUS

## 2023-12-26 MED ORDER — DILTIAZEM HCL-DEXTROSE 125-5 MG/125ML-% IV SOLN (PREMIX)
5.0000 mg/h | INTRAVENOUS | Status: DC
  Administered 2023-12-26: 5 mg/h via INTRAVENOUS

## 2023-12-26 MED ORDER — AMIODARONE LOAD VIA INFUSION
150.0000 mg | Freq: Once | INTRAVENOUS | Status: AC
  Administered 2023-12-26: 150 mg via INTRAVENOUS
  Filled 2023-12-26: qty 83.34

## 2023-12-26 MED ORDER — DILTIAZEM LOAD VIA INFUSION
20.0000 mg | Freq: Once | INTRAVENOUS | Status: AC
  Administered 2023-12-26: 20 mg via INTRAVENOUS
  Filled 2023-12-26: qty 20

## 2023-12-26 MED ORDER — AMIODARONE IV BOLUS ONLY 150 MG/100ML: 150.0000 mg | Freq: Once | INTRAVENOUS | Status: DC

## 2023-12-26 MED ORDER — AMIODARONE HCL IN DEXTROSE 360-4.14 MG/200ML-% IV SOLN
60.0000 mg/h | INTRAVENOUS | Status: AC
  Administered 2023-12-26 – 2023-12-27 (×2): 60 mg/h via INTRAVENOUS
  Filled 2023-12-26 (×2): qty 200

## 2023-12-26 NOTE — ED Notes (Signed)
 Dr. Bari at bedside. Discontinued BiPAP. Transition to NRB on 7L.

## 2023-12-26 NOTE — ED Provider Notes (Signed)
 [place] EMERGENCY DEPARTMENT AT [facility] Provider Note   CSN: [id number] Arrival date & time: 12/26/23  2034     Patient presents with: Brian [name] is a 68 y.o. male.   HPI   68 year old male presents emergency department as a level 2 trauma, fall on blood thinners with presumed head injury.  Patient was reportedly found on the floor by fire/EMS and was reported to be altered.  Found to be tachycardic with concern for runs of ventricular tachycardia.  Was also hypoxic on room air, improved with nonrebreather.  CBG was appropriate on scene.  Patient is generally unwell appearing on arrival but is oriented, answers in one-word answers.  History limited secondary to acuity.  He currently denies any pain.  He has a chronic ongoing cough that is slightly worse than baseline.  States that he had a mechanical fall and does not believe that he had syncope.  Prior to Admission medications   Medication Sig Start Date End Date Taking? Authorizing Provider  apixaban  (ELIQUIS ) 5 MG TABS tablet TAKE 1 TABLET BY MOUTH TWICE A DAY 11/07/23   Duanne Butler DASEN, MD  atorvastatin  (LIPITOR) 20 MG tablet TAKE 1 TABLET BY MOUTH EVERYDAY AT BEDTIME 11/21/23   Duanne Butler DASEN, MD  azelastine  (ASTELIN ) 0.1 % nasal spray Place 2 sprays into both nostrils 2 (two) times daily. Use in each nostril as directed 08/26/23   Duanne Butler DASEN, MD  Continuous Glucose Receiver (DEXCOM G7 RECEIVER) DEVI Use to continuously check blood sugar daily. 05/14/23   Duanne Butler DASEN, MD  Continuous Glucose Sensor (DEXCOM G7 SENSOR) MISC Use to continuously monitor glucose daily. Change meter every 10 days. 05/14/23   Duanne Butler DASEN, MD  diltiazem  (CARDIZEM  CD) 180 MG 24 hr capsule Take 1 capsule (180 mg total) by mouth daily. 12/13/23   Duanne Butler DASEN, MD  fluticasone  (FLONASE ) 50 MCG/ACT nasal spray Place 2 sprays into both nostrils daily. 07/22/23   Duanne Butler DASEN, MD  glipiZIDE  (GLUCOTROL  XL) 5 MG  24 hr tablet Take 1 tablet (5 mg total) by mouth daily with breakfast. 11/05/23   Duanne Butler DASEN, MD  levETIRAcetam  (KEPPRA ) 500 MG tablet Take 1 tablet (500 mg total) by mouth 2 (two) times daily. 04/18/23 04/12/24  Camara, Amadou, MD  metoprolol  (TOPROL -XL) 200 MG 24 hr tablet Take 1 tablet (200 mg total) by mouth daily. 08/26/23   Duanne Butler DASEN, MD  pantoprazole  (PROTONIX ) 40 MG tablet Take 1 tablet (40 mg total) by mouth daily. 04/22/23   Duanne Butler DASEN, MD    Allergies: Codeine and Metformin  and related    Review of Systems  Constitutional:  Negative for fever.  Respiratory:  Positive for cough and shortness of breath.   Cardiovascular:  Negative for chest pain and leg swelling.  Gastrointestinal:  Negative for abdominal pain, diarrhea and vomiting.  Musculoskeletal:  Negative for back pain and neck pain.  Skin:  Negative for rash.  Neurological:  Negative for headaches.    Updated Vital Signs BP (!) 150/123   Pulse (!) 184   Temp 98.4 F (36.9 C) (Oral)   Resp (!) 30   Ht 5' 5 (1.651 m)   Wt 70 kg   SpO2 100%   BMI 25.68 kg/m   Physical Exam Vitals and nursing note reviewed.  Constitutional:      General: He is not in acute distress.    Appearance: Normal appearance.  HENT:  Head: Normocephalic.     Mouth/Throat:     Mouth: Mucous membranes are moist.  Eyes:     Pupils: Pupils are equal, round, and reactive to light.  Cardiovascular:     Rate and Rhythm: Normal rate.  Pulmonary:     Effort: Respiratory distress present.     Comments: Tachypneic, scattered rales, diminished breath sounds left mid to lower lung Abdominal:     Palpations: Abdomen is soft.     Tenderness: There is no abdominal tenderness. There is no guarding.  Musculoskeletal:        General: No swelling or deformity.     Cervical back: No tenderness.  Skin:    General: Skin is warm.     Comments: Abrasion to left knee  Neurological:     Mental Status: He is alert and oriented to  person, place, and time. Mental status is at baseline.  Psychiatric:        Mood and Affect: Mood normal.     (all labs ordered are listed, but only abnormal results are displayed) Labs Reviewed  CULTURE, BLOOD (ROUTINE X 2)  CULTURE, BLOOD (ROUTINE X 2)  RESP PANEL BY RT-PCR (RSV, FLU A&B, COVID)  [id number]  CBC WITH DIFFERENTIAL/PLATELET  COMPREHENSIVE METABOLIC PANEL WITH GFR  BRAIN NATRIURETIC PEPTIDE  MAGNESIUM   URINALYSIS, ROUTINE W REFLEX MICROSCOPIC  I-STAT VENOUS BLOOD GAS, ED  I-STAT CG4 LACTIC ACID, ED  TROPONIN I (HIGH SENSITIVITY)    EKG: EKG Interpretation Date/Time:  Thursday December 26 2023 20:46:15 EST Ventricular Rate:  182 PR Interval:    QRS Duration:  132 QT Interval:  275 QTC Calculation: 479 R Axis:   258  Text Interpretation: Wide-QRS tachycardia Right bundle branch block RBBB baseline, irregular Confirmed by Bari Flank [phone]) on 12/26/2023 8:59:51 PM  Radiology: No results found.   .Critical Care  Performed by: Bari Flank HERO, DO Authorized by: Bari Flank HERO, DO   Critical care provider statement:    Critical care time (minutes):  75   Critical care time was exclusive of:  Separately billable procedures and treating other patients   Critical care was necessary to treat or prevent imminent or life-threatening deterioration of the following conditions:  Respiratory failure, circulatory failure and cardiac failure   Critical care was time spent personally by me on the following activities:  Development of treatment plan with patient or surrogate, discussions with consultants, evaluation of patient's response to treatment, examination of patient, ordering and review of laboratory studies, ordering and review of radiographic studies, ordering and performing treatments and interventions, pulse oximetry, re-evaluation of patient's condition and review of old charts   I assumed direction of critical care for this patient from another provider in  my specialty: no     Care discussed with: admitting provider      Medications Ordered in the ED  diltiazem  (CARDIZEM ) 1 mg/mL load via infusion 20 mg (20 mg Intravenous Bolus from Bag 12/26/23 2106)    And  diltiazem  (CARDIZEM ) 125 mg in dextrose  5% 125 mL (1 mg/mL) infusion (5 mg/hr Intravenous New Bag/Given 12/26/23 2106)  sodium chloride  0.9 % bolus 500 mL (has no administration in time range)                                    Medical Decision Making Amount and/or Complexity of Data Reviewed Labs: ordered. Radiology: ordered.  Risk Prescription drug management. Decision regarding  hospitalization.   68 year old male presents to the emergency department as a level 2 trauma, fall with possible head injury on Eliquis .  However patient arrives tachycardic, tachypneic, hypoxic and unstable.  He does not have any obvious head trauma, he is drowsy but not acutely altered.  Patient is tachycardic, irregular, atrial fibrillation with RVR.  He has a baseline widened QRS and right bundle branch block.  Fluids and antiarrhythmic ordered.  Blood pressure is stable.  Patient continues to be tachypneic with respiratory rates in the 40s.  Review of chart shows that patient has been seen by hospice and palliative for lung cancer with mets along with other significant comorbidities.  He has been listed as DNR/DNI.  I confirmed with the patient that he does not want a breathing tube.  He acknowledges that he is DNI.  However he is requesting that he is no longer DNR and wishes to receive compressions.  I discussed with the patient multiple different times the significance of being DNI but not DNR.  He is oriented and states for him that he wants compressions.  Chest x-ray shows a left-sided pleural effusion.  I discussed with him thoracentesis and removing this fluid and he declines.  Patient became tachypneic, appears to be tiring out.  Transition to BiPAP.  However VBG shows that he is alkalotic at  7.5 with a pCO2 of 29, concerned that this will worsen it.  I again confirmed with the patient that he would want chest compressions.  I offered comfort care, pain medicine and he declines.  Consulted with cardiology.  Patient has not had great rate control on the Cardizem .  We discussed giving him a 150 mg dose of amiodarone and transitioning to that infusion.  This has been ordered and nursing staff aware.  ICU has been consulted given the critically ill state with the patient.  They will come evaluate as patient will require admission.     Final diagnoses:  None    ED Discharge Orders     None          Bari Roxie HERO, DO 12/26/23 2319

## 2023-12-26 NOTE — Progress Notes (Signed)
   12/26/23 2212  BiPAP/CPAP/SIPAP  BiPAP/CPAP/SIPAP Pt Type Adult  BiPAP/CPAP/SIPAP SERVO  Mask Type Full face mask  Mask Size Medium  Set Rate 15 breaths/min  Respiratory Rate 34 breaths/min  IPAP 14 cmH20  EPAP 5 cmH2O  PEEP 5 cmH20  FiO2 (%) 40 %  Minute Ventilation 12  Leak 82  Peak Inspiratory Pressure (PIP) 14  Tidal Volume (Vt) 350  Patient Home Machine No  Patient Home Mask No  Patient Home Tubing No  Auto Titrate No  Device Plugged into RED Power Outlet Yes  BiPAP/CPAP /SiPAP Vitals  Pulse Rate (!) 166  Resp (!) 35  SpO2 100 %  Bilateral Breath Sounds Clear

## 2023-12-26 NOTE — Consult Note (Signed)
 Cardiology Consultation   Patient ID: Brian [name] MRN: [id number]; DOB: 1955-04-08  Admit date: 12/26/2023 Date of Consult: 12/26/2023  PCP:  Brian Butler DASEN, MD   [facility] Providers Cardiologist:  Lynwood Schilling, MD        Patient Profile: Brian [name] is a 68 y.o. 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  History of Present Illness: Mr. Rago presented after a fall and was found on the floor by fire/EMS with altered mental status.  On first responder evaluation was tachycardic with concerns for ventricular tachycardia, was hypoxic.  Only complaint is an ongoing cough but story is limited by altered mental status.  In the ED, was afebrile, tachycardic with heart rates as high as 184, tachypneic, hypertensive, hypoxic requiring nonrebreather.  EKG demonstrating A-fib with RVR, prior right bundle branch block, extreme axis.  Lab work notable for K4.2, creatinine 1.3, BNP 991, Trope 42, lactic acid 2.7, hemoglobin 10, platelets 272.  He was reportedly on hospice but now wants full care.  He was started on diltiazem  for rate control without any improvement.   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 Alvia, laser therapy OD   Seizures [facility])    pt reports this is from low blood sugar   Throat cancer [facility])     Past Surgical History:  Procedure Laterality Date   BIOPSY   03/01/2023   Procedure: BIOPSY;  Surgeon: Shila Gustav GAILS, MD;  Location: [facility] ENDOSCOPY;  Service: Gastroenterology;;   BRONCHIAL NEEDLE ASPIRATION BIOPSY  10/30/2022   Procedure: BRONCHIAL NEEDLE ASPIRATION BIOPSIES;  Surgeon: Brenna Adine LITTIE, DO;  Location: [facility] ENDOSCOPY;  Service: Cardiopulmonary;;   COLONOSCOPY     DIRECT LARYNGOSCOPY N/A 02/10/2020   Procedure: DIRECT LARYNGOSCOPY WITH BIOPSY;  Surgeon: [name] Clunes, MD;  Location: [facility] OR;  Service: ENT;  Laterality: N/A;   Ear surgery as a child     ESOPHAGOGASTRODUODENOSCOPY N/A 03/01/2023   Procedure: ESOPHAGOGASTRODUODENOSCOPY (EGD);  Surgeon: Nandigam, Kavitha V, MD;  Location: [facility] ENDOSCOPY;  Service: Gastroenterology;  Laterality: N/A;   IR GASTROSTOMY TUBE MOD SED  03/23/2020   IR GASTROSTOMY TUBE REMOVAL  06/01/2020   IR IMAGING GUIDED PORT INSERTION  03/09/2020   IR REMOVAL TUN ACCESS W/ PORT W/O FL MOD SED  02/08/2021   TEMPORARY PACEMAKER N/A 02/26/2023   Procedure: TEMPORARY PACEMAKER;  Surgeon: Anner Alm ORN, MD;  Location: [facility] INVASIVE CV LAB;  Service: Cardiovascular;  Laterality: N/A;   THORACENTESIS  10/30/2022   Procedure: THORACENTESIS;  Surgeon: Brenna Adine LITTIE, DO;  Location: [facility] ENDOSCOPY;  Service: Cardiopulmonary;;  L pleural effusion   TONSILLECTOMY     VIDEO BRONCHOSCOPY Left 10/30/2022   Procedure: VIDEO BRONCHOSCOPY WITHOUT FLUORO;  Surgeon: Brenna Adine LITTIE, DO;  Location: [facility] ENDOSCOPY;  Service: Cardiopulmonary;  Laterality: Left;   VIDEO BRONCHOSCOPY WITH ENDOBRONCHIAL ULTRASOUND Bilateral 10/30/2022   Procedure: VIDEO BRONCHOSCOPY WITH ENDOBRONCHIAL ULTRASOUND;  Surgeon: Brenna Adine CROME, DO;  Location: [facility] ENDOSCOPY;  Service: Cardiopulmonary;  Laterality: Bilateral;     Home Medications:  Prior to Admission medications   Medication Sig Start Date End Date Taking? Authorizing Provider  apixaban  (ELIQUIS ) 5 MG TABS tablet TAKE 1 TABLET BY MOUTH TWICE A DAY 11/07/23   Brian Butler DASEN, MD  atorvastatin   (LIPITOR) 20 MG tablet TAKE 1 TABLET BY MOUTH EVERYDAY AT BEDTIME 11/21/23   Brian Butler DASEN, MD  azelastine  (ASTELIN ) 0.1 % nasal spray Place 2 sprays into both nostrils 2 (two) times daily. Use in each nostril as directed 08/26/23   Brian Butler DASEN, MD  Continuous Glucose Receiver (DEXCOM G7 RECEIVER) DEVI Use to continuously check blood sugar daily. 05/14/23   Brian Butler DASEN, MD  Continuous Glucose Sensor (DEXCOM G7 SENSOR) MISC Use to continuously monitor glucose daily. Change meter every 10 days. 05/14/23   Brian Butler DASEN, MD  diltiazem  (CARDIZEM  CD) 180 MG 24 hr capsule Take 1 capsule (180 mg total) by mouth daily. 12/13/23   Brian Butler DASEN, MD  fluticasone  (FLONASE ) 50 MCG/ACT nasal spray Place 2 sprays into both nostrils daily. 07/22/23   Brian Butler DASEN, MD  glipiZIDE  (GLUCOTROL  XL) 5 MG 24 hr tablet Take 1 tablet (5 mg total) by mouth daily with breakfast. 11/05/23   Brian Butler DASEN, MD  levETIRAcetam  (KEPPRA ) 500 MG tablet Take 1 tablet (500 mg total) by mouth 2 (two) times daily. 04/18/23 04/12/24  Camara, Amadou, MD  metoprolol  (TOPROL -XL) 200 MG 24 hr tablet Take 1 tablet (200 mg total) by mouth daily. 08/26/23   Brian Butler DASEN, MD  pantoprazole  (PROTONIX ) 40 MG tablet Take 1 tablet (40 mg total) by mouth daily. 04/22/23   Brian Butler DASEN, MD    Scheduled Meds:  Continuous Infusions:  amiodarone     [START ON 12/27/2023] amiodarone     amiodarone     piperacillin-tazobactam 3.375 g (12/26/23 2314)   vancomycin     PRN Meds:   Allergies:    Allergies  Allergen Reactions   Codeine Other (See Comments)    Unknown reaction, severe headach   Metformin  And Related Diarrhea    Social History:   Social History   Socioeconomic History   Marital status: Divorced    Spouse name: Not on file   Number of children: 2   Years of education: 12   Highest education level: 12th grade  Occupational History   Occupation: Disabled  Tobacco Use   Smoking status: Never     Passive exposure: Never   Smokeless tobacco: Never   Tobacco comments:    Verified by son, Sebron Mcmahill  Vaping Use   Vaping status: Never Used  Substance and Sexual Activity   Alcohol use: Yes    Alcohol/week: 0.0 standard drinks of alcohol    Comment: seldom   Drug use: Not Currently    Frequency: 7.0 times per week    Types: Marijuana    Comment: marijauna for arthritis, last 03-13-20   Sexual activity: Not Currently    Partners: Female  Other Topics Concern   Not on file  Social History Narrative   Two children.  5 grandchildren.     Social Drivers of Health   Financial Resource Strain: Low Risk  (06/06/2023)   Overall Financial Resource Strain (CARDIA)    Difficulty of Paying Living Expenses:  Not hard at all  Food Insecurity: No Food Insecurity (06/06/2023)   Hunger Vital Sign    Worried About Running Out of Food in the Last Year: Never true    Ran Out of Food in the Last Year: Never true  Transportation Needs: Unmet Transportation Needs (06/06/2023)   PRAPARE - Transportation    Lack of Transportation (Medical): Yes    Lack of Transportation (Non-Medical): Yes  Physical Activity: Inactive (06/06/2023)   Exercise Vital Sign    Days of Exercise per Week: 0 days    Minutes of Exercise per Session: 0 min  Stress: No Stress Concern Present (06/06/2023)   [employer] of Occupational Health - Occupational Stress Questionnaire    Feeling of Stress : Not at all  Social Connections: Socially Isolated (06/06/2023)   Social Connection and Isolation Panel    Frequency of Communication with Friends and Family: Three times a week    Frequency of Social Gatherings with Friends and Family: Once a week    Attends Religious Services: Never    Database Administrator or Organizations: No    Attends Banker Meetings: Never    Marital Status: Divorced  Catering Manager Violence: Not At Risk (06/06/2023)   Humiliation, Afraid, Rape, and Kick questionnaire    Fear of  Current or Ex-Partner: No    Emotionally Abused: No    Physically Abused: No    Sexually Abused: No    Family History:   Family History  Problem Relation Age of Onset   Colon cancer Mother    Hyperlipidemia Father    Hypertension Father    Breast cancer Sister    Coronary artery disease Neg Hx        no early CAD.   Stomach cancer Neg Hx    Rectal cancer Neg Hx    Esophageal cancer Neg Hx      ROS:  Please see the history of present illness.  All other ROS reviewed and negative.     Physical Exam/Data: Vitals:   12/26/23 2230 12/26/23 2233 12/26/23 2240 12/26/23 2300  BP: 128/89 (!) 117/103 (!) 116/92 (!) 130/115  Pulse: (!) 137 (!) 159 (!) 154 (!) 166  Resp: (!) 31 (!) 31 (!) 30 (!) 31  Temp:      TempSrc:      SpO2: 100% 100% 100% 100%  Weight:      Height:        Intake/Output Summary (Last 24 hours) at 12/26/2023 2319 Last data filed at 12/26/2023 2227 Gross per 24 hour  Intake 1529.41 ml  Output --  Net 1529.41 ml      12/26/2023    8:52 PM 12/09/2023   11:15 AM 11/24/2023   12:02 AM  Last 3 Weights  Weight (lbs) 154 lb 5.2 oz 154 lb 8 oz 163 lb  Weight (kg) 70 kg 70.081 kg 73.936 kg     Body mass index is 25.68 kg/m.  General:  chronically ill appearing, cachetic, NAD HEENT: BiPAP Neck: no JVD Vascular: No carotid bruits; Distal pulses 2+ bilaterally Cardiac:  normal S1, S2; tachycardic, irregular irregular Lungs:  clear to auscultation on right, no breath sounds on left Abd: soft, nontender, no hepatomegaly  Ext: no edema, colder Musculoskeletal:  No deformities, BUE and BLE strength normal and equal Skin: warm and dry  Neuro:  CNs 2-12 intact, no focal abnormalities noted Psych:  Normal affect   EKG:  The EKG was personally reviewed and demonstrates: A-fib with  RVR, prior right bundle branch block with extreme axis Telemetry:  Telemetry was personally reviewed and demonstrates: Same  Relevant CV Studies:  TTE 02/2023 IMPRESSIONS    1.  Cannot fully assess wall motion. Left ventricular ejection fraction,  by estimation, is 60 to 65%. The left ventricle has normal function. There  is moderate concentric left ventricular hypertrophy. Left ventricular  diastolic parameters are  indeterminate.   2. Right ventricular systolic function is mildly reduced. The right  ventricular size is mildly enlarged. Tricuspid regurgitation signal is  inadequate for assessing PA pressure.   3. Left atrial size was moderately dilated.   4. Right atrial size was mildly dilated.   5. The mitral valve was not well visualized. No evidence of mitral valve  regurgitation.   6. The aortic valve was not well visualized. Aortic valve regurgitation  is not visualized.   7. The inferior vena cava is dilated in size with >50% respiratory  variability, suggesting right atrial pressure of 8 mmHg.   Laboratory Data: High Sensitivity Troponin:   Recent Labs  Lab 12/26/23 2141  TROPONINIHS 42*     Chemistry Recent Labs  Lab 12/26/23 2119 12/26/23 2141  NA 137 140  K 4.8 4.2  CL  --  104  CO2  --  21*  GLUCOSE  --  165*  BUN  --  31*  CREATININE  --  1.31*  CALCIUM   --  8.6*  MG  --  2.0  GFRNONAA  --  59*  ANIONGAP  --  15    Recent Labs  Lab 12/26/23 2141  PROT 6.3*  ALBUMIN 2.3*  AST 21  ALT 22  ALKPHOS 111  BILITOT 1.2   Lipids No results for input(s): CHOL, TRIG, HDL, LABVLDL, LDLCALC, CHOLHDL in the last 168 hours.  Hematology Recent Labs  Lab 12/26/23 2119 12/26/23 2141  WBC  --  5.0  RBC  --  3.70*  HGB 10.9* 10.2*  HCT 32.0* 34.2*  MCV  --  92.4  MCH  --  27.6  MCHC  --  29.8*  RDW  --  15.3  PLT  --  272   Thyroid  No results for input(s): TSH, FREET4 in the last 168 hours.  BNP Recent Labs  Lab 12/26/23 2141  BNP 991.1*    DDimer No results for input(s): DDIMER in the last 168 hours.  Radiology/Studies:  DG Chest Portable 1 View Result Date: 12/26/2023 CLINICAL DATA:  History of lung  cancer, status post fall. EXAM: PORTABLE CHEST 1 VIEW COMPARISON:  November 24, 2023 FINDINGS: The cardiac silhouette is stable in size and appearance. A large left pleural effusion is seen. This is mildly decreased in size when compared to the prior study. Marked severity left perihilar airspace disease is seen. This is limited in evaluation secondary to overlying defibrillator pads. No pneumothorax is identified. Degenerative changes are present throughout the thoracic spine. IMPRESSION: 1. Large left pleural effusion, mildly decreased in size when compared to the prior study. 2. Marked severity left perihilar airspace disease which likely corresponds to findings seen within this region on the prior chest CT (dated September 02, 2023). While this may represent sequelae associated with post radiation changes, an infectious and/or neoplastic process cannot be excluded. Electronically Signed   By: Suzen Dials M.D.   On: 12/26/2023 21:37   DG Pelvis Portable Result Date: 12/26/2023 CLINICAL DATA:  Status post fall. EXAM: PORTABLE PELVIS 1-2 VIEWS COMPARISON:  None Available. FINDINGS: There is no  evidence of pelvic fracture or diastasis. No pelvic bone lesions are seen. IMPRESSION: Negative. Electronically Signed   By: Suzen Dials M.D.   On: 12/26/2023 21:27   DG Knee 2 Views Left Result Date: 12/26/2023 CLINICAL DATA:  Status post fall. EXAM: LEFT KNEE - 1-2 VIEW COMPARISON:  None Available. FINDINGS: No evidence of an acute fracture, dislocation, or joint effusion. Marked severity tricompartmental degenerative changes are seen, most prominently involving the medial tibiofemoral compartment space. There is marked severity vascular calcification. IMPRESSION: Marked severity tricompartmental degenerative changes. Electronically Signed   By: Suzen Dials M.D.   On: 12/26/2023 21:26   Assessment and Plan: Atrial fibrillation with rapid ventricular rate Likely in the setting of malignancy, failure to  thrive, possible infection.  Will be difficult to control with these underlying factors but amiodarone offers best rate and rhythm option.  Would stop additional nodal agents given history of transient complete heart block earlier this year.  Continue Eliquis  or heparin  if there are pending procedures such as thoracentesis given his chest x-ray findings. - Start amiodarone for 5 g load - Continue Eliquis  versus heparin  pending procedures - Hold diltiazem  or metoprolol  - Ongoing infectious and malignant workup per medicine - Consider repeat TTE pending goals of care  2.  Elevated troponin No chest pain, consistent with demand ischemia/myocardial injury  3.  Hyperlipidemia - Continue atorvastatin   Risk Assessment/Risk Scores:         CHA2DS2-VASc Score = 6   This indicates a 9.7% annual risk of stroke. The patient's score is based upon: CHF History: 1 HTN History: 1 Diabetes History: 0 Stroke History: 2 Vascular Disease History: 1 Age Score: 1 Gender Score: 0        For questions or updates, please contact [facility] Please consult [email or web address] for contact info under   Signed, Ozell Bushman, MD  12/26/2023 11:19 PM

## 2023-12-26 NOTE — Progress Notes (Signed)
 Orthopedic Tech Progress Note Patient Details:  Brian [name] December 03, 1955 [id number] LV2T FOT. Dismissed by RN.  Patient ID: Brian [name], male   DOB: [birth year], 68 y.o.   MRN: [id number]  Brian [name] 12/26/2023, 9:51 PM

## 2023-12-26 NOTE — ED Notes (Addendum)
 Delay in CT d/t medical instability/difficult venous access. Dr. Bari states to hold off on CT until patient is more stable.

## 2023-12-26 NOTE — Telephone Encounter (Signed)
 Copied from CRM [id number]. Topic: Referral - Request for Referral >> Dec 26, 2023 11:24 AM Kendralyn S wrote: Did the patient discuss referral with their provider in the last year? No (If No - schedule appointment) (If Yes - send message)  Appointment offered? Yes  Type of order/referral and detailed reason for visit: in home care  Preference of office, provider, location: n/a  If referral order, have you been seen by this specialty before? No (If Yes, this issue or another issue? When? Where?  Can we respond through MyChart? Yes

## 2023-12-26 NOTE — ED Notes (Signed)
 Trauma Response Nurse Documentation   Brian [name] is a 68 y.o. male arriving to [facility] ED via EMS  On Eliquis  (apixaban ) daily. Trauma was activated as a Level 2 by ED charge RN based on the following trauma criteria Elderly patients > 65 with head trauma on anti-coagulation (excluding ASA).  Patient cleared for CT by Dr. Bari EDP. Pt transported to CT with trauma response nurse present to monitor. RN remained with the patient throughout their absence from the department for clinical observation.   GCS 14.   History   Past Medical History:  Diagnosis Date   Arthritis    Atrial fibrillation (HCC)    Cancer (HCC) 2021   SCC base of tongue   Cardiomyopathy    Cerebrovascular accident [facility]) 2012   R MCA   Diabetes mellitus    Hyperlipidemia    Hypertension    Left-sided weakness    Lung cancer, lower lobe (HCC) 05/2021   Nausea and vomiting 03/01/2023   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 (HCC)    Dr. Alvia, laser therapy OD   Seizures [facility])    pt reports this is from low blood sugar   Throat cancer [facility])      Past Surgical History:  Procedure Laterality Date   BIOPSY  03/01/2023   Procedure: BIOPSY;  Surgeon: Shila Gustav GAILS, MD;  Location: [facility] ENDOSCOPY;  Service: Gastroenterology;;   BRONCHIAL NEEDLE ASPIRATION BIOPSY  10/30/2022   Procedure: BRONCHIAL NEEDLE ASPIRATION BIOPSIES;  Surgeon: Brenna Adine LITTIE, DO;  Location: [facility] ENDOSCOPY;  Service: Cardiopulmonary;;   COLONOSCOPY     DIRECT LARYNGOSCOPY N/A 02/10/2020   Procedure: DIRECT LARYNGOSCOPY WITH BIOPSY;  Surgeon: [name] Clunes, MD;  Location: [facility] OR;  Service: ENT;  Laterality: N/A;   Ear surgery as a child     ESOPHAGOGASTRODUODENOSCOPY N/A 03/01/2023   Procedure: ESOPHAGOGASTRODUODENOSCOPY (EGD);  Surgeon: Nandigam, Kavitha V, MD;  Location: [facility] ENDOSCOPY;  Service: Gastroenterology;  Laterality: N/A;   IR GASTROSTOMY TUBE MOD SED  03/23/2020   IR GASTROSTOMY TUBE REMOVAL  06/01/2020   IR IMAGING GUIDED  PORT INSERTION  03/09/2020   IR REMOVAL TUN ACCESS W/ PORT W/O FL MOD SED  02/08/2021   TEMPORARY PACEMAKER N/A 02/26/2023   Procedure: TEMPORARY PACEMAKER;  Surgeon: Anner Alm ORN, MD;  Location: [facility] INVASIVE CV LAB;  Service: Cardiovascular;  Laterality: N/A;   THORACENTESIS  10/30/2022   Procedure: THORACENTESIS;  Surgeon: Brenna Adine LITTIE, DO;  Location: [facility] ENDOSCOPY;  Service: Cardiopulmonary;;  L pleural effusion   TONSILLECTOMY     VIDEO BRONCHOSCOPY Left 10/30/2022   Procedure: VIDEO BRONCHOSCOPY WITHOUT FLUORO;  Surgeon: Brenna Adine LITTIE, DO;  Location: [facility] ENDOSCOPY;  Service: Cardiopulmonary;  Laterality: Left;   VIDEO BRONCHOSCOPY WITH ENDOBRONCHIAL ULTRASOUND Bilateral 10/30/2022   Procedure: VIDEO BRONCHOSCOPY WITH ENDOBRONCHIAL ULTRASOUND;  Surgeon: Brenna Adine LITTIE, DO;  Location: [facility] ENDOSCOPY;  Service: Cardiopulmonary;  Laterality: Bilateral;       Initial Focused Assessment (If applicable, or please see trauma documentation): Confused male presents via EMS from home after an unwitnessed fall. Pt on eliquis , unsure if he hit his head, found down by roommate. Pt arrives in wide complex tachycardia rate 180s to 200s, placed on ZOLL monitor on arrival. Hypxoia noted, on face mask 8L. Abrasions to L knee, no other trauma noted.  Airway patent, BS rhonchi R, diminished L No obvious uncontrolled hemorrhage GCS 14 PERRLA 4  CT's Completed:   CT Head and CT C-Spine   Interventions:  IV  start attempts, lab draw Blood cultures Resp swab Portable chest, left knee, pelvis XRAY EKG Diltiazem  bolus/infusion NS bolus (warmed) Bipap  Plan for disposition:  Admit  Consults completed:  Cardiology [name] paged at [phone], call returned [phone] CCM [name] paged [phone], call returned [phone]  Event Summary: Delay in transfer to CT d/t medical instability - EDP asked TRN to wait to transport until more appropriate.     Bedside handoff with ED RN [name].    [name]  Trauma  Response RN  Please call TRN at [phone] for further assistance.

## 2023-12-26 NOTE — ED Triage Notes (Signed)
 Patient from home BIB [facility] where he was found on the floor, unwitnessed fall. Patient on blood thinner but unsure if head was hit, Aox3. Activated as a level 2 trauma out of caution. HR 170-200 afib/ runs of vtach  Spo2- 82% on RA improved to 100% on 8 L O2 500 LR CBG 179  140/100  Abrasion to L knee denies head,neck or back pain Rhonchi in the lung sounds  Smells of urine.

## 2023-12-26 NOTE — Telephone Encounter (Signed)
 Copied from CRM [id number]. Topic: General - Other >> Dec 26, 2023 11:26 AM Antony RAMAN wrote: Reason for CRM: patient and roommate called today requesting in home care, while requesting refferal I offered an appt but roommate said they couldn't bring him until he gets a bath and he's going downhill, also having incontinence issues in bed.

## 2023-12-27 ENCOUNTER — Inpatient Hospital Stay (HOSPITAL_COMMUNITY)

## 2023-12-27 ENCOUNTER — Emergency Department (HOSPITAL_COMMUNITY)

## 2023-12-27 DIAGNOSIS — G936 Cerebral edema: Secondary | ICD-10-CM | POA: Diagnosis present

## 2023-12-27 DIAGNOSIS — I130 Hypertensive heart and chronic kidney disease with heart failure and stage 1 through stage 4 chronic kidney disease, or unspecified chronic kidney disease: Secondary | ICD-10-CM | POA: Diagnosis present

## 2023-12-27 DIAGNOSIS — I69354 Hemiplegia and hemiparesis following cerebral infarction affecting left non-dominant side: Secondary | ICD-10-CM | POA: Diagnosis not present

## 2023-12-27 DIAGNOSIS — I4891 Unspecified atrial fibrillation: Secondary | ICD-10-CM

## 2023-12-27 DIAGNOSIS — I2699 Other pulmonary embolism without acute cor pulmonale: Secondary | ICD-10-CM | POA: Diagnosis present

## 2023-12-27 DIAGNOSIS — C7931 Secondary malignant neoplasm of brain: Secondary | ICD-10-CM

## 2023-12-27 DIAGNOSIS — J90 Pleural effusion, not elsewhere classified: Secondary | ICD-10-CM | POA: Diagnosis not present

## 2023-12-27 DIAGNOSIS — I429 Cardiomyopathy, unspecified: Secondary | ICD-10-CM | POA: Diagnosis present

## 2023-12-27 DIAGNOSIS — E1122 Type 2 diabetes mellitus with diabetic chronic kidney disease: Secondary | ICD-10-CM | POA: Diagnosis present

## 2023-12-27 DIAGNOSIS — I472 Ventricular tachycardia, unspecified: Secondary | ICD-10-CM | POA: Diagnosis present

## 2023-12-27 DIAGNOSIS — D649 Anemia, unspecified: Secondary | ICD-10-CM | POA: Diagnosis not present

## 2023-12-27 DIAGNOSIS — W010XXA Fall on same level from slipping, tripping and stumbling without subsequent striking against object, initial encounter: Secondary | ICD-10-CM | POA: Diagnosis present

## 2023-12-27 DIAGNOSIS — E43 Unspecified severe protein-calorie malnutrition: Secondary | ICD-10-CM | POA: Diagnosis present

## 2023-12-27 DIAGNOSIS — C782 Secondary malignant neoplasm of pleura: Secondary | ICD-10-CM | POA: Diagnosis present

## 2023-12-27 DIAGNOSIS — C7800 Secondary malignant neoplasm of unspecified lung: Secondary | ICD-10-CM | POA: Diagnosis present

## 2023-12-27 DIAGNOSIS — N1832 Chronic kidney disease, stage 3b: Secondary | ICD-10-CM | POA: Diagnosis present

## 2023-12-27 DIAGNOSIS — Z515 Encounter for palliative care: Secondary | ICD-10-CM | POA: Diagnosis not present

## 2023-12-27 DIAGNOSIS — I4821 Permanent atrial fibrillation: Secondary | ICD-10-CM | POA: Diagnosis present

## 2023-12-27 DIAGNOSIS — R9082 White matter disease, unspecified: Secondary | ICD-10-CM | POA: Diagnosis not present

## 2023-12-27 DIAGNOSIS — G40909 Epilepsy, unspecified, not intractable, without status epilepticus: Secondary | ICD-10-CM | POA: Diagnosis present

## 2023-12-27 DIAGNOSIS — I480 Paroxysmal atrial fibrillation: Secondary | ICD-10-CM | POA: Diagnosis not present

## 2023-12-27 DIAGNOSIS — R55 Syncope and collapse: Secondary | ICD-10-CM | POA: Diagnosis not present

## 2023-12-27 DIAGNOSIS — C140 Malignant neoplasm of pharynx, unspecified: Secondary | ICD-10-CM | POA: Diagnosis present

## 2023-12-27 DIAGNOSIS — D72819 Decreased white blood cell count, unspecified: Secondary | ICD-10-CM | POA: Diagnosis present

## 2023-12-27 DIAGNOSIS — D631 Anemia in chronic kidney disease: Secondary | ICD-10-CM | POA: Diagnosis present

## 2023-12-27 DIAGNOSIS — R930 Abnormal findings on diagnostic imaging of skull and head, not elsewhere classified: Secondary | ICD-10-CM

## 2023-12-27 DIAGNOSIS — J9601 Acute respiratory failure with hypoxia: Secondary | ICD-10-CM

## 2023-12-27 DIAGNOSIS — Z7189 Other specified counseling: Secondary | ICD-10-CM | POA: Diagnosis not present

## 2023-12-27 DIAGNOSIS — Z66 Do not resuscitate: Secondary | ICD-10-CM | POA: Diagnosis present

## 2023-12-27 DIAGNOSIS — J910 Malignant pleural effusion: Secondary | ICD-10-CM | POA: Diagnosis present

## 2023-12-27 DIAGNOSIS — C029 Malignant neoplasm of tongue, unspecified: Secondary | ICD-10-CM | POA: Diagnosis present

## 2023-12-27 DIAGNOSIS — Z7901 Long term (current) use of anticoagulants: Secondary | ICD-10-CM | POA: Diagnosis not present

## 2023-12-27 DIAGNOSIS — Z1152 Encounter for screening for COVID-19: Secondary | ICD-10-CM | POA: Diagnosis not present

## 2023-12-27 LAB — CBC
HCT: 31 % — ABNORMAL LOW (ref 39.0–52.0)
HCT: 32.9 % — ABNORMAL LOW (ref 39.0–52.0)
Hemoglobin: 9.6 g/dL — ABNORMAL LOW (ref 13.0–17.0)
Hemoglobin: 9.8 g/dL — ABNORMAL LOW (ref 13.0–17.0)
MCH: 28.4 pg (ref 26.0–34.0)
MCH: 27.8 pg (ref 26.0–34.0)
MCHC: 31 g/dL (ref 30.0–36.0)
MCHC: 29.8 g/dL — ABNORMAL LOW (ref 30.0–36.0)
MCV: 91.7 fL (ref 80.0–100.0)
MCV: 93.5 fL (ref 80.0–100.0)
Platelets: 221 K/uL (ref 150–400)
Platelets: 220 K/uL (ref 150–400)
RBC: 3.38 MIL/uL — ABNORMAL LOW (ref 4.22–5.81)
RBC: 3.52 MIL/uL — ABNORMAL LOW (ref 4.22–5.81)
RDW: 15.5 % (ref 11.5–15.5)
RDW: 15.5 % (ref 11.5–15.5)
WBC: 3.8 K/uL — ABNORMAL LOW (ref 4.0–10.5)
WBC: 3.9 K/uL — ABNORMAL LOW (ref 4.0–10.5)
nRBC: 0 % (ref 0.0–0.2)
nRBC: 0 % (ref 0.0–0.2)

## 2023-12-27 LAB — RESPIRATORY PANEL BY PCR

## 2023-12-27 LAB — BASIC METABOLIC PANEL WITH GFR
Anion gap: 12 (ref 5–15)
Anion gap: 15 (ref 5–15)
BUN: 27 mg/dL — ABNORMAL HIGH (ref 8–23)
BUN: 26 mg/dL — ABNORMAL HIGH (ref 8–23)
CO2: 23 mmol/L (ref 22–32)
CO2: 20 mmol/L — ABNORMAL LOW (ref 22–32)
Calcium: 8.4 mg/dL — ABNORMAL LOW (ref 8.9–10.3)
Calcium: 8.4 mg/dL — ABNORMAL LOW (ref 8.9–10.3)
Chloride: 105 mmol/L (ref 98–111)
Chloride: 104 mmol/L (ref 98–111)
Creatinine, Ser: 1.39 mg/dL — ABNORMAL HIGH (ref 0.61–1.24)
Creatinine, Ser: 1.31 mg/dL — ABNORMAL HIGH (ref 0.61–1.24)
GFR, Estimated: 55 mL/min — ABNORMAL LOW (ref >60)
GFR, Estimated: 59 mL/min — ABNORMAL LOW (ref >60)
Glucose, Bld: 118 mg/dL — ABNORMAL HIGH (ref 70–99)
Glucose, Bld: 126 mg/dL — ABNORMAL HIGH (ref 70–99)
Potassium: 4.1 mmol/L (ref 3.5–5.1)
Potassium: 3.7 mmol/L (ref 3.5–5.1)
Sodium: 140 mmol/L (ref 135–145)
Sodium: 139 mmol/L (ref 135–145)

## 2023-12-27 LAB — MRSA NEXT GEN BY PCR, NASAL: MRSA by PCR Next Gen: NOT DETECTED

## 2023-12-27 LAB — CBG MONITORING, ED
Glucose-Capillary: 181 mg/dL — ABNORMAL HIGH (ref 70–99)
Glucose-Capillary: 158 mg/dL — ABNORMAL HIGH (ref 70–99)

## 2023-12-27 LAB — HEPARIN LEVEL (UNFRACTIONATED): Heparin Unfractionated: >1.1 [IU]/mL — ABNORMAL HIGH (ref 0.30–0.70)

## 2023-12-27 LAB — GLUCOSE, CAPILLARY
Glucose-Capillary: 119 mg/dL — ABNORMAL HIGH (ref 70–99)
Glucose-Capillary: 110 mg/dL — ABNORMAL HIGH (ref 70–99)
Glucose-Capillary: 125 mg/dL — ABNORMAL HIGH (ref 70–99)

## 2023-12-27 LAB — TROPONIN I (HIGH SENSITIVITY): Troponin I (High Sensitivity): 41 ng/L — ABNORMAL HIGH (ref <18)

## 2023-12-27 LAB — I-STAT CG4 LACTIC ACID, ED: Lactic Acid, Venous: 1.6 mmol/L (ref 0.5–1.9)

## 2023-12-27 LAB — APTT: aPTT: 39 s — ABNORMAL HIGH (ref 24–36)

## 2023-12-27 MED ORDER — ACETAMINOPHEN 325 MG PO TABS: 650.0000 mg | ORAL_TABLET | Freq: Four times a day (QID) | ORAL | Status: DC | PRN

## 2023-12-27 MED ORDER — ONDANSETRON 4 MG PO TBDP: 4.0000 mg | ORAL_TABLET | Freq: Four times a day (QID) | ORAL | Status: DC | PRN

## 2023-12-27 MED ORDER — GLYCOPYRROLATE 0.2 MG/ML IJ SOLN: 0.2000 mg | INTRAMUSCULAR | Status: DC | PRN

## 2023-12-27 MED ORDER — POLYETHYLENE GLYCOL 3350 17 G PO PACK: 17.0000 g | PACK | Freq: Every day | ORAL | Status: DC | PRN

## 2023-12-27 MED ORDER — PANTOPRAZOLE SODIUM 40 MG PO TBEC
40.0000 mg | DELAYED_RELEASE_TABLET | Freq: Every day | ORAL | Status: DC
  Filled 2023-12-27: qty 1

## 2023-12-27 MED ORDER — ACETAMINOPHEN 650 MG RE SUPP: 650.0000 mg | Freq: Four times a day (QID) | RECTAL | Status: DC | PRN

## 2023-12-27 MED ORDER — AMIODARONE IV BOLUS ONLY 150 MG/100ML: 150.0000 mg | Freq: Once | INTRAVENOUS | Status: DC

## 2023-12-27 MED ORDER — SODIUM CHLORIDE 0.9 % IV SOLN
2.0000 g | INTRAVENOUS | Status: DC
  Administered 2023-12-27: 2 g via INTRAVENOUS
  Filled 2023-12-27: qty 20

## 2023-12-27 MED ORDER — DIPHENHYDRAMINE HCL 50 MG/ML IJ SOLN: 25.0000 mg | INTRAMUSCULAR | Status: DC | PRN

## 2023-12-27 MED ORDER — IOHEXOL 350 MG/ML SOLN
150.0000 mL | Freq: Once | INTRAVENOUS | Status: AC | PRN
  Administered 2023-12-27: 150 mL via INTRAVENOUS

## 2023-12-27 MED ORDER — DOCUSATE SODIUM 100 MG PO CAPS: 100.0000 mg | ORAL_CAPSULE | Freq: Two times a day (BID) | ORAL | Status: DC | PRN

## 2023-12-27 MED ORDER — IPRATROPIUM-ALBUTEROL 0.5-2.5 (3) MG/3ML IN SOLN: 3.0000 mL | Freq: Four times a day (QID) | RESPIRATORY_TRACT | Status: DC | PRN

## 2023-12-27 MED ORDER — INSULIN ASPART 100 UNIT/ML IJ SOLN
0.0000 [IU] | INTRAMUSCULAR | Status: DC
  Administered 2023-12-27: 2 [IU] via SUBCUTANEOUS
  Administered 2023-12-27: 3 [IU] via SUBCUTANEOUS
  Filled 2023-12-27: qty 2
  Filled 2023-12-27 (×2): qty 3

## 2023-12-27 MED ORDER — DEXAMETHASONE SOD PHOSPHATE PF 10 MG/ML IJ SOLN
4.0000 mg | Freq: Four times a day (QID) | INTRAMUSCULAR | Status: DC
  Administered 2023-12-27 – 2023-12-30 (×11): 4 mg via INTRAVENOUS
  Filled 2023-12-27 (×10): qty 0.4

## 2023-12-27 MED ORDER — GUAIFENESIN 100 MG/5ML PO LIQD
10.0000 mL | ORAL | Status: DC | PRN
Start: 2023-12-27 — End: 2023-12-30

## 2023-12-27 MED ORDER — SODIUM CHLORIDE 0.9 % IV SOLN: INTRAVENOUS | Status: AC

## 2023-12-27 MED ORDER — ORAL CARE MOUTH RINSE: 15.0000 mL | OROMUCOSAL | Status: DC | PRN

## 2023-12-27 MED ORDER — HEPARIN (PORCINE) 25000 UT/250ML-% IV SOLN
1050.0000 [IU]/h | INTRAVENOUS | Status: DC
  Administered 2023-12-27: 1050 [IU]/h via INTRAVENOUS
  Filled 2023-12-27: qty 250

## 2023-12-27 MED ORDER — ACETAMINOPHEN 325 MG PO TABS: 650.0000 mg | ORAL_TABLET | ORAL | Status: DC | PRN

## 2023-12-27 MED ORDER — MIDAZOLAM HCL (PF) 2 MG/2ML IJ SOLN: 2.0000 mg | INTRAMUSCULAR | Status: DC | PRN

## 2023-12-27 MED ORDER — SODIUM CHLORIDE 0.9 % IV SOLN
500.0000 mg | INTRAVENOUS | Status: DC
  Administered 2023-12-27: 500 mg via INTRAVENOUS
  Filled 2023-12-27: qty 5

## 2023-12-27 MED ORDER — FENTANYL CITRATE (PF) 50 MCG/ML IJ SOSY: 25.0000 ug | PREFILLED_SYRINGE | INTRAMUSCULAR | Status: DC | PRN

## 2023-12-27 MED ORDER — CHLORHEXIDINE GLUCONATE CLOTH 2 % EX PADS
6.0000 | MEDICATED_PAD | Freq: Every day | CUTANEOUS | Status: DC
  Administered 2023-12-27 – 2023-12-30 (×2): 6 via TOPICAL

## 2023-12-27 MED ORDER — GADOBUTROL 1 MMOL/ML IV SOLN
7.0000 mL | Freq: Once | INTRAVENOUS | Status: AC | PRN
  Administered 2023-12-27: 7 mL via INTRAVENOUS

## 2023-12-27 MED ORDER — LEVETIRACETAM 500 MG PO TABS
500.0000 mg | ORAL_TABLET | Freq: Two times a day (BID) | ORAL | Status: DC
Start: 2023-12-27 — End: 2023-12-27

## 2023-12-27 MED ORDER — IPRATROPIUM-ALBUTEROL 0.5-2.5 (3) MG/3ML IN SOLN
3.0000 mL | Freq: Four times a day (QID) | RESPIRATORY_TRACT | Status: DC
  Administered 2023-12-27 (×2): 3 mL via RESPIRATORY_TRACT
  Filled 2023-12-27 (×2): qty 3

## 2023-12-27 MED ORDER — ORAL CARE MOUTH RINSE
15.0000 mL | OROMUCOSAL | Status: DC
  Administered 2023-12-27 – 2023-12-30 (×9): 15 mL via OROMUCOSAL

## 2023-12-27 MED ORDER — ONDANSETRON HCL 4 MG/2ML IJ SOLN: 4.0000 mg | Freq: Four times a day (QID) | INTRAMUSCULAR | Status: DC | PRN

## 2023-12-27 MED ORDER — GLYCOPYRROLATE 1 MG PO TABS: 1.0000 mg | ORAL_TABLET | ORAL | Status: DC | PRN

## 2023-12-27 MED ORDER — POLYVINYL ALCOHOL 1.4 % OP SOLN: 1.0000 [drp] | Freq: Four times a day (QID) | OPHTHALMIC | Status: DC | PRN

## 2023-12-27 MED ORDER — AMIODARONE LOAD VIA INFUSION
150.0000 mg | Freq: Once | INTRAVENOUS | Status: AC
  Administered 2023-12-27: 150 mg via INTRAVENOUS
  Filled 2023-12-27: qty 83.34

## 2023-12-27 MED ORDER — DEXAMETHASONE SOD PHOSPHATE PF 10 MG/ML IJ SOLN: 10.0000 mg | Freq: Three times a day (TID) | INTRAMUSCULAR | Status: DC

## 2023-12-27 MED ORDER — LEVETIRACETAM (KEPPRA) 500 MG/5 ML ADULT IV PUSH
500.0000 mg | Freq: Two times a day (BID) | INTRAVENOUS | Status: DC
  Administered 2023-12-27 – 2023-12-30 (×7): 500 mg via INTRAVENOUS
  Filled 2023-12-27 (×7): qty 5

## 2023-12-27 MED ORDER — ATORVASTATIN CALCIUM 10 MG PO TABS
20.0000 mg | ORAL_TABLET | Freq: Every day | ORAL | Status: DC
  Filled 2023-12-27: qty 2

## 2023-12-27 NOTE — IPAL (Signed)
  Interdisciplinary Goals of Care Family Meeting   Date carried out: 12/27/2023  Location of the meeting: Phone conference  Member's involved: Physician and Family Member or next of kin  Durable Power of Attorney or environmental health practitioner: son    Discussion: We discussed goals of care for Brian [name] .  I first met with Brian [name] and he said he wanted me to talk to his son about making decisions. I asked if he was ok with me talking about hospice- he agreed. He also wants to change his code status to DNR, and we talked about what this would look like. I called his son Brian [name], who agreed with DNR. We discussed hospice and home vs SNF as potential destinations. He agrees that SNF with hospice would be best. We discussed changing him to comfort care here while we are working on getting him out of the hospital, but in the interim if he gets worse we will escalate his comfort care and can always keep him here to escalate his comfort therapy.  He agrees. TOC notified of desire for SNF with hospice. Comfort orders placed.   Code status: Full DNR  Disposition: In-patient comfort care   Time spent for the meeting: 15 min.  Brian [name] 12/27/2023, 3:20 PM

## 2023-12-27 NOTE — Consult Note (Signed)
 WOC Nurse Consult Note: Reason for Consult: knee abrasion Patient from home, found down on floor Wound does not appear to be acute from this fall.  Wound type: trauma Pressure Injury POA: NA Measurement:see nursing flow sheets Wound bed:100% black eschar Drainage (amount, consistency, odor) see nursing flow sheets Periwound:intact  Dressing procedure/placement/frequency: Paint knee wound with betadine, allow to air dry OK to cover with foam.   Re consult if needed, will not follow at this time. Thanks  Kendrea [employer], RN,CWOCN, CNS, [employer] [phone]

## 2023-12-27 NOTE — Progress Notes (Signed)
 PT Cancellation Note  Patient Details Name: Brian [name] MRN: [id number] DOB: March 21, 1955   Cancelled Treatment:    Reason Eval/Treat Not Completed: Patient at procedure or test/unavailable  Currently off floor for MRI. Noted GOC meeting planned for today as well. Will attempt follow-up this afternoon as schedule permits and as indicated pending care plans.  Leontine Roads, PT, DPT [facility]  Rehabilitation Services Physical Therapist Office: [phone] Website: [email or web address]  Leontine GORMAN Roads 12/27/2023, 11:15 AM

## 2023-12-27 NOTE — TOC Initial Note (Addendum)
 Transition of Care [facility]) - Initial/Assessment Note    Patient Details  Name: Brian [name] MRN: [id number] Date of Birth: May 20, 1955  Transition of Care [facility]) CM/SW Contact:    Justina Delcia Czar, RN Phone Number: [phone] 12/27/2023, 1:28 PM  Clinical Narrative:                 Spoke to pt at bedside. Gave permission to speak to family, son or sister. Contacted pt's son and states pt was at [facility] with his Medicaid. Pt wanted to try to live independently but his health declined. Son wants pt to go back to SNF rehab.   Will need PT/OT evaluation and recommendations. Sent message to Unit Inpatient CSW to follow for SNF placement.   Son gave permission to create [facility] and fax referral.   1730 Spoke to attending. Consult for Residential Hospice vs SNF LTC/Home Hospice. Referral sent to [facility] for Residential Hospice. Referral received by Hospice rep, Amy. States they will review tomorrow.       Expected Discharge Plan: Skilled Nursing Facility Barriers to Discharge: Continued Medical Work up   Patient Goals and CMS Choice Patient states their goals for this hospitalization and ongoing recovery are:: wants to get better CMS Medicare.gov Compare Post Acute Care list provided to:: Patient Represenative (must comment) Choice offered to / list presented to : Adult Children      Expected Discharge Plan and Services In-house Referral: Clinical Social Work Discharge Planning Services: CM Consult Post Acute Care Choice: Skilled Nursing Facility Living arrangements for the past 2 months: Skilled Nursing Facility                                      Prior Living Arrangements/Services Living arrangements for the past 2 months: Skilled Nursing Facility Lives with:: Roommate Patient language and need for interpreter reviewed:: Yes Do you feel safe going back to the place where you live?: Yes      Need for Family Participation in Patient Care: Yes (Comment) Care  giver support system in place?: Yes (comment) Current home services: DME (walker, cane) Criminal Activity/Legal Involvement Pertinent to Current Situation/Hospitalization: No - Comment as needed  Activities of Daily Living      Permission Sought/Granted Permission sought to share information with : Case Manager, Family Supports, PCP Permission granted to share information with : Yes, Verbal Permission Granted  Share Information with NAME: Brian [name]  Permission granted to share info w AGENCY: PCP, DME, SNF rehab  Permission granted to share info w Relationship: son  Permission granted to share info w Contact Information: [phone]  Emotional Assessment Appearance:: Appears older than stated age Attitude/Demeanor/Rapport: Engaged Affect (typically observed): Accepting Orientation: : Oriented to Self, Oriented to Place, Fluctuating Orientation (Suspected and/or reported Sundowners)   Psych Involvement: No (comment)  Admission diagnosis:  A-fib (HCC) [I48.91] Respiratory distress [R06.03] Pleural effusion [J90] Tachycardia [R00.0] Patient Active Problem List   Diagnosis Date Noted   A-fib (HCC) 12/27/2023   Leg swelling 06/18/2023   Type 2 diabetes mellitus with complication, without long-term current use of insulin  (HCC) 06/18/2023   Pain due to onychomycosis of toenails of both feet 03/25/2023   Coffee ground emesis 03/01/2023   Nausea and vomiting 03/01/2023   Ileus (HCC) 02/28/2023   Debility 02/28/2023   AKI (acute kidney injury) 02/28/2023   Adjustment disorder with depressed mood 02/28/2023   Marijuana use 02/28/2023  Symptomatic bradycardia 02/26/2023   Atrioventricular block, complete (HCC) 02/20/2023   Seizure (HCC) 01/08/2023   Pericardial effusion 01/07/2023   Seizure-like activity (HCC) 01/07/2023   Gait disturbance 01/07/2023   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   Pleural effusion 10/30/2022   History of head and neck cancer 10/17/2022   Chronic congestive heart failure with left ventricular diastolic dysfunction (HCC) 11/19/2021   History of CVA with residual deficit 11/03/2021   Hypokalemia 11/03/2021   Dysphagia 11/03/2021   Fall at home, subsequent encounter 11/03/2021   Diastolic dysfunction 11/03/2021   Pancytopenia (HCC) 11/03/2021   Primary cancer of left lower lobe of lung (HCC) 06/13/2021   Normocytic normochromic anemia 03/20/2021   Stage 3b chronic kidney disease (HCC) 03/13/2021   Syncope and collapse 02/02/2021   Dilated cardiomyopathy (HCC) 02/02/2021   Weight loss, unintentional 05/26/2020   Insomnia 05/09/2020   Chronic kidney disease, stage III (moderate) (HCC) 05/09/2020   Chemotherapy induced diarrhea 05/09/2020   Malignant neoplasm of base of tongue (HCC) 03/02/2020   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 [facility])    Status epilepticus (HCC) 01/18/2013   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 status epilepticus, not intractable (HCC) 01/15/2013   Hyperlipidemia    Long term current use of anticoagulant 03/11/2010   Diabetes mellitus, type II (HCC) 12/20/2009   Essential hypertension, benign 12/20/2009   Atrial fibrillation (HCC) 12/20/2009   Systolic heart failure (HCC) 12/20/2009   CEREBROVASCULAR ACCIDENT 12/20/2009   PCP:  Duanne Butler DASEN, MD Pharmacy:   [id number] [address], [place] - [address] [place] [address] Phone: [phone] Fax: [phone]     Social Drivers of Health (SDOH) Social History: SDOH Screenings   Food Insecurity: No Food Insecurity (06/06/2023)  Housing: Low Risk  (06/06/2023)  Transportation Needs: Unmet Transportation Needs (06/06/2023)  Utilities: Not At Risk (06/06/2023)  Alcohol Screen: Low Risk  (06/06/2023)  Depression (PHQ2-9): Low Risk  (10/31/2023)  Financial Resource Strain: Low Risk  (06/06/2023)  Physical Activity:  Inactive (06/06/2023)  Social Connections: Socially Isolated (06/06/2023)  Stress: No Stress Concern Present (06/06/2023)  Tobacco Use: Low Risk  (12/10/2023)  Health Literacy: Adequate Health Literacy (06/06/2023)   SDOH Interventions:     Readmission Risk Interventions    11/08/2021    9:46 AM  Readmission Risk Prevention Plan  Transportation Screening Complete  PCP or Specialist Appt within 3-5 Days Complete  HRI or Home Care Consult Complete  Social Work Consult for Recovery Care Planning/Counseling Complete  Palliative Care Screening Not Applicable  Medication Review Oceanographer) Complete

## 2023-12-27 NOTE — H&P (Signed)
 NAME:  Brian [name], MRN:  [id number], DOB:  [birth year], LOS: 0 ADMISSION DATE:  12/26/2023, CONSULTATION DATE:  11/7 REFERRING MD:  Dr. 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  Patient presents to [facility] on 11/6 overnight as fall on blood thinners. States he was trying to get up from bed and lost his balance. Found to be hypoxic and placed on nrb. States never lost consciousness. Patient w/ tachypnea and rales bilaterally more diminished on left. CXR w/ large left pleural effusion; similar to that seen on CT of chest September 02, 2023. Vbg 7.51, 29, 117, 23. Placed on bipap for wob. Patient states he is DNI but okay w/ compressions. Patient denies receiving thoracentesis. States he does not want to be comfort care. Hgb stable 10.2. EKG w/ afib RVR. Trop 42 and bnp 991. Started on cardizem . Cards consulted and recommended amio. Wbc 5 and afebrile. LA 2.7. Cultures obtained and started on broad spectrum abx. CT head/c-spine no acute bleed or fracture; Large 3.3 cm suspected mass in anterior aspect of right middle cranial fossa; potential meningioma; exuberant edema in adjacent temporal lobe. PCCM consulted.     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 Alvia, laser therapy OD   Seizures [facility])    pt  reports this is from low blood sugar   Throat cancer (HCC)      Significant Hospital Events: Including procedures, antibiotic start and stop dates in addition to other pertinent events   11/7 admit w/ fall; hypoxic respiratory failure; afib rvr  Interim History / Subjective:  Patient sleeping states no respiratory distress on 2L [place] RR 20s HR afib rate 130-150s  Objective    Blood pressure (!) 133/112, pulse (!) 132, temperature 98.4 F (36.9 C), temperature source Oral, resp. rate (!) 40, height 5' 5 (1.651 m), weight 70 kg, SpO2 100%.    FiO2 (%):  [40 %] 40 % PEEP:  [5 cmH20] 5 cmH20   Intake/Output Summary (Last 24 hours) at 12/27/2023 0029 Last data filed at 12/26/2023 2345 Gross per 24 hour  Intake 1592.39 ml  Output --  Net 1592.39 ml   Filed Weights   12/26/23 2052  Weight: 70 kg    Examination: General: chronically ill appearing male in NAD HEENT: MM pink/poor dentition; [place] in place Neuro: Aox3; MAE weaker on LUE CV: s1s2, irregularly irregular rate 130-150s, no m/r/g PULM:  b/l rhonchi more dim on left; [place] 2l/m GI: soft, bsx4 active  Extremities: warm/dry, no edema; left shin abrasion   Resolved problem list   Assessment and Plan   Acute respiratory failure w/ hypoxia Left sided pleural effusion: chronic last CT 09/02/2023; CT chest and recent  CXR also showing irregular right perihilar soft tissue infectious and/or neoplastic process cannot be excluded Plan: -[place] and wean for sats >92% -pulm toiletry: is/flutter -patient refusing thoracentesis -bipap prn  -rocephin [place]  -send rvp and exp sputum; follow cultures  Afib rvr Hx of afib on eliquis  Elevated bnp and mildly elevated trop Hx of HFimpEF HTN/HLD Plan: -card consulted; appreciate recs -cont amio -tele monitoring -start on heparin  -electrolytes wnl; trend and replete as needed -if bp remains stable consider resuming home metoprolol  and dilt in am -consider echo -trend trop -resume  home statin  Fall: states he lost balance did not lose consciousness or hit head L knee abrasion: xray no acute fracture -CT head/c-spine no acute bleed or fracture Plan: -dressing for knee abrasion -pt/ot eval  CT Head finding: Large 3.3 cm suspected mass in anterior aspect of right middle cranial fossa; potential meningioma; exuberant edema in adjacent temporal lobe; recommending MRI Plan: -will get MRI brain stat; consider NSG consult pending results -resume home keppra  bid iv for now until safe to take po -seizure precautions -consider steroids pending mri  T2dm  -A1c a month ago 7.9 Plan: -ssi and cbg monitoring  Hx of seizure disorder Hx of cva w/ left hemiparesis Plan: -PT/OT/SLP -resume home keppra  and statin  CKD 3b Plan: -Trend BMP / urinary output -Replace electrolytes as indicated -Avoid nephrotoxic agents, ensure adequate renal perfusion  Stage IV squamous cell carcinoma of tongue w/ mets to lung post chemoradiation Plan: -f/u outpt heme/onc  GOC Plan: -has been seen by palliative care on 09/2023; decision then made to be DNR/DNI. Son Beverley is POA who lives in [place] . Estranged relationship w/ daughter. Here patient states he does not want to be intubated but is okay w/ compressions/shock and if intubation is needed during cardiac arrest would be okay but does not want to be on vent long term. Attempted to call son Beverley over phone no answer -palliative care consult     Labs   CBC: Recent Labs  Lab 12/26/23 2119 12/26/23 2141  WBC  --  5.0  NEUTROABS  --  4.5  HGB 10.9* 10.2*  HCT 32.0* 34.2*  MCV  --  92.4  PLT  --  272    Basic Metabolic Panel: Recent Labs  Lab 12/26/23 2119 12/26/23 2141  NA 137 140  K 4.8 4.2  CL  --  104  CO2  --  21*  GLUCOSE  --  165*  BUN  --  31*  CREATININE  --  1.31*  CALCIUM   --  8.6*  MG  --  2.0   GFR: Estimated Creatinine Clearance: 45.6 mL/min (A) (by C-G formula based on SCr of 1.31 mg/dL  (H)). Recent Labs  Lab 12/26/23 2108 12/26/23 2141  WBC  --  5.0  LATICACIDVEN 2.7*  --     Liver Function Tests: Recent Labs  Lab 12/26/23 2141  AST 21  ALT 22  ALKPHOS 111  BILITOT 1.2  PROT 6.3*  ALBUMIN 2.3*   No results for input(s): LIPASE, AMYLASE in the last 168 hours. No results for input(s): AMMONIA in the last 168 hours.  ABG    Component Value Date/Time   HCO3 23.3 12/26/2023 2119   TCO2 24 12/26/2023 2119   ACIDBASEDEF 5.6 (H) 02/26/2023 1850   O2SAT 99 12/26/2023 2119     Coagulation Profile: No results for input(s): INR, PROTIME in the last 168 hours.  Cardiac Enzymes: No results for input(s): CKTOTAL, CKMB, CKMBINDEX, TROPONINI in  the last 168 hours.  HbA1C: Hgb A1c MFr Bld  Date/Time Value Ref Range Status  10/31/2023 12:27 PM 7.9 (H) <5.7 % Final    Comment:    For someone without known diabetes, a hemoglobin A1c value of 6.5% or greater indicates that they may have  diabetes and this should be confirmed with a follow-up  test. . For someone with known diabetes, a value <7% indicates  that their diabetes is well controlled and a value  greater than or equal to 7% indicates suboptimal  control. A1c targets should be individualized based on  duration of diabetes, age, comorbid conditions, and  other considerations. . Currently, no consensus exists regarding use of hemoglobin A1c for diagnosis of diabetes for children. SABRA   07/22/2023 12:03 PM 7.2 (H) <5.7 % Final    Comment:    For someone without known diabetes, a hemoglobin A1c value of 6.5% or greater indicates that they may have  diabetes and this should be confirmed with a follow-up  test. . For someone with known diabetes, a value <7% indicates  that their diabetes is well controlled and a value  greater than or equal to 7% indicates suboptimal  control. A1c targets should be individualized based on  duration of diabetes, age, comorbid conditions, and  other  considerations. . Currently, no consensus exists regarding use of hemoglobin A1c for diagnosis of diabetes for children. .     CBG: No results for input(s): GLUCAP in the last 168 hours.  Review of Systems:   Review of Systems  Constitutional:  Negative for fever.  Respiratory:  Positive for cough and sputum production. Negative for shortness of breath and wheezing.   Cardiovascular:  Negative for chest pain.  Gastrointestinal:  Negative for abdominal pain, nausea and vomiting.  Neurological:  Negative for seizures and loss of consciousness.     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   Surgical History:   Past Surgical History:  Procedure Laterality Date   BIOPSY  03/01/2023   Procedure: BIOPSY;  Surgeon: Shila Gustav GAILS, MD;  Location: [facility] ENDOSCOPY;  Service: Gastroenterology;;   BRONCHIAL NEEDLE ASPIRATION BIOPSY  10/30/2022   Procedure: BRONCHIAL NEEDLE ASPIRATION BIOPSIES;  Surgeon: Brenna Adine CROME, DO;  Location: [facility] ENDOSCOPY;  Service: Cardiopulmonary;;   COLONOSCOPY     DIRECT LARYNGOSCOPY N/A 02/10/2020   Procedure: DIRECT LARYNGOSCOPY WITH BIOPSY;  Surgeon: [name] Clunes, MD;  Location: [facility] OR;  Service: ENT;  Laterality: N/A;   Ear surgery as a child     ESOPHAGOGASTRODUODENOSCOPY N/A 03/01/2023   Procedure: ESOPHAGOGASTRODUODENOSCOPY (EGD);  Surgeon: Nandigam, Kavitha V, MD;  Location: [facility] ENDOSCOPY;  Service: Gastroenterology;  Laterality: N/A;   IR GASTROSTOMY TUBE MOD SED  03/23/2020   IR GASTROSTOMY TUBE REMOVAL  06/01/2020   IR IMAGING GUIDED PORT INSERTION  03/09/2020   IR REMOVAL TUN ACCESS W/ PORT W/O FL  MOD SED  02/08/2021   TEMPORARY PACEMAKER N/A 02/26/2023   Procedure: TEMPORARY PACEMAKER;  Surgeon: Anner Alm ORN, MD;  Location: [facility] INVASIVE CV LAB;  Service: Cardiovascular;  Laterality: N/A;   THORACENTESIS  10/30/2022   Procedure: THORACENTESIS;  Surgeon: Brenna Adine CROME, DO;  Location: [facility] ENDOS[facility];  Service: Cardiopulmonary;;  L pleural effusion  TONSILLECTOMY     VIDEO BRONCHOSCOPY Left 10/30/2022   Procedure: VIDEO BRONCHOSCOPY WITHOUT FLUORO;  Surgeon: Brenna Adine CROME, DO;  Location: [facility] ENDOS[facility];  Service: Cardiopulmonary;  Laterality: Left;   VIDEO BRONCHOSCOPY WITH ENDOBRONCHIAL ULTRASOUND Bilateral 10/30/2022   Procedure: VIDEO BRONCHOSCOPY WITH ENDOBRONCHIAL ULTRASOUND;  Surgeon: Brenna Adine CROME, DO;  Location: [facility] ENDOS[facility];  Service: Cardiopulmonary;  Laterality: Bilateral;     Social History:   reports that he has never smoked. He has never been exposed to tobacco smoke. He has never used smokeless tobacco. He reports current alcohol use. He reports that he does not currently use drugs after having used the following drugs: Marijuana. Frequency: 7.00 times per week.   Family History:  His family history includes Breast cancer in his sister; Colon cancer in his mother; Hyperlipidemia in his father; Hypertension in his father. There is no history of Coronary artery disease, Stomach cancer, Rectal cancer, or Esophageal cancer.   Allergies Allergies  Allergen Reactions   Codeine Other (See Comments)    Unknown reaction, severe headach   Metformin  And Related Diarrhea     Home Medications  Prior to Admission medications   Medication Sig Start Date End Date Taking? Authorizing Provider  apixaban  (ELIQUIS ) 5 MG TABS tablet TAKE 1 TABLET BY MOUTH TWICE A DAY 11/07/23   Duanne Butler DASEN, MD  atorvastatin  (LIPITOR) 20 MG tablet TAKE 1 TABLET BY MOUTH EVERYDAY AT BEDTIME 11/21/23   Duanne Butler DASEN, MD  azelastine  (ASTELIN ) 0.1 % nasal spray Place 2 sprays into both nostrils 2  (two) times daily. Use in each nostril as directed 08/26/23   Duanne Butler DASEN, MD  Continuous Glucose Receiver (DEXCOM G7 RECEIVER) DEVI Use to continuously check blood sugar daily. 05/14/23   Duanne Butler DASEN, MD  Continuous Glucose Sensor (DEXCOM G7 SENSOR) MISC Use to continuously monitor glucose daily. Change meter every 10 days. 05/14/23   Duanne Butler DASEN, MD  diltiazem  (CARDIZEM  CD) 180 MG 24 hr capsule Take 1 capsule (180 mg total) by mouth daily. 12/13/23   Duanne Butler DASEN, MD  fluticasone  (FLONASE ) 50 MCG/ACT nasal spray Place 2 sprays into both nostrils daily. 07/22/23   Duanne Butler DASEN, MD  glipiZIDE  (GLUCOTROL  XL) 5 MG 24 hr tablet Take 1 tablet (5 mg total) by mouth daily with breakfast. 11/05/23   Duanne Butler DASEN, MD  levETIRAcetam  (KEPPRA ) 500 MG tablet Take 1 tablet (500 mg total) by mouth 2 (two) times daily. 04/18/23 04/12/24  Camara, Amadou, MD  metoprolol  (TOPROL -XL) 200 MG 24 hr tablet Take 1 tablet (200 mg total) by mouth daily. 08/26/23   Duanne Butler DASEN, MD  pantoprazole  (PROTONIX ) 40 MG tablet Take 1 tablet (40 mg total) by mouth daily. 04/22/23   Duanne Butler DASEN, MD     Critical care time: 45 minutes     JD Emilio RIGGERS [facility] 12/27/2023, 12:30 AM  Please see [email or web address] for pager details.  From 7A-7P if no response, please call [phone]. After hours, please call ELink [phone].

## 2023-12-27 NOTE — Evaluation (Addendum)
 Clinical/Bedside Swallow Evaluation Patient Details  Name: Brian [name] MRN: [id number] Date of Birth: [birth year]  Today's Date: 12/27/2023 Time: SLP Start Time (ACUTE ONLY): 0944 SLP Stop Time (ACUTE ONLY): 1008 SLP Time Calculation (min) (ACUTE ONLY): 24 min  Past Medical History:  Past Medical History:  Diagnosis Date   Arthritis    Atrial fibrillation (HCC)    Cancer (HCC) 2021   SCC base of tongue   Cardiomyopathy    Cerebrovascular accident (HCC) 2012   R MCA   Diabetes mellitus    Hyperlipidemia    Hypertension    Left-sided weakness    Lung cancer, lower lobe (HCC) 05/2021   Nausea and vomiting 03/01/2023   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 Alvia, laser therapy OD   Seizures (HCC)    pt reports this is from low blood sugar   Throat cancer [facility])    Past Surgical History:  Past Surgical History:  Procedure Laterality Date   BIOPSY  03/01/2023   Procedure: BIOPSY;  Surgeon: Shila Gustav GAILS, MD;  [facility]: [facility];  Service: Gastroenterology;;   BRONCHIAL NEEDLE ASPIRATION BIOPSY  10/30/2022   Procedure: BRONCHIAL NEEDLE ASPIRATION BIOPSIES;  Surgeon: Brenna Adine LITTIE, DO;  Location: [facility];  Service: Cardiopulmonary;;   COLONOSCOPY     DIRECT LARYNGOSCOPY N/A 02/10/2020   Procedure: DIRECT LARYNGOSCOPY WITH BIOPSY;  Surgeon: [name] Clunes, MD;  Location: [facility] OR;  Service: ENT;  Laterality: N/A;   Ear surgery as a child     ESOPHAGOGASTRODUODENOSCOPY N/A 03/01/2023   Procedure: ESOPHAGOGASTRODUODENOSCOPY (EGD);  Surgeon: Nandigam, Kavitha V, MD;  Location: [facility] ENDOSCOPY;  Service: Gastroenterology;  Laterality: N/A;   IR GASTROSTOMY TUBE MOD SED  03/23/2020   IR GASTROSTOMY TUBE REMOVAL  06/01/2020   IR IMAGING GUIDED PORT INSERTION  03/09/2020   IR REMOVAL TUN ACCESS W/ PORT W/O FL MOD SED  02/08/2021   TEMPORARY PACEMAKER N/A 02/26/2023   Procedure: TEMPORARY PACEMAKER;  Surgeon: Anner Alm ORN, MD;  [facility] INVASIVE CV LAB;   Service: Cardiovascular;  Laterality: N/A;   THORACENTESIS  10/30/2022   Procedure: THORACENTESIS;  Surgeon: Brenna Adine LITTIE, DO;  Location[facility];  Service: Cardiopulmonary;;  L pleural effusion   TONSILLECTOMY     VIDEO BRONCHOSCOPY Left 10/30/2022   Procedure: VIDEO BRONCHOSCOPY WITHOUT FLUORO;  Surgeon: Brenna Adine LITTIE, DO;  Location: [facility] ENDOS[facility];  Service: Cardiopulmonary;  Laterality: Left;   VIDEO BRONCHOSCOPY WITH ENDOBRONCHIAL ULTRASOUND Bilateral 10/30/2022   Procedure: VIDEO BRONCHOSCOPY WITH ENDOBRONCHIAL ULTRASOUND;  Surgeon: Brenna Adine LITTIE, DO;  Location: [facility] ENDOS[facility];  Service: Cardiopulmonary;  Laterality: Bilateral;   HPI:  68 yo male with history of stage IV SCC of tongue with mets to lung s/p chemo and XRT, T2DM, prior CVA with L hemiparesis, seizure disorder, CKD 3B, HLD, HTN, A-fib on Eliquis , HFimpEF who presents to the ED 11/6 after a fall. Found to have a large L sided pleural effusion and in A-fib with RVR, requiring increased O2 via NRB/BiPAP. CTH shows large suspected mass in the anterior aspect of the R middle cranial fossa, MRI pending. CT Cervical Spine shows severe multilevel degenerative changes including degenerative disc disease and facet and uncovertebral hypertrophy with varying degrees of neural foraminal stenosis as well as likely moderate canal stenosis at C5-C6. He reports frequent regurgitation PTA, EGD with esophageal dilation planned for 02/04/24. MBS 10/26/21 with mild-moderate pharyngeal dysphagia suspected to be related to post-radiation changes with what appears to be edematous tissue, particularly  noted at the epiglottis and arytenoids. Penetration occurred with all liquids and even purees with thin liquids passing below the vocal folds, resulting in sensed aspiration as evidenced by a throat clear (PAS 7). Given that penetration is likely with thicker liquids (and aspiration possible across a meal), there is increased residue, and pt has remained  stable from a pulmonary standpoint, recommend continuing with thin liquids in the setting of thorough oral care and implementation of a cough or more effortful throat clear post-swallow. Seen clinically by SLP January 2025, when he was independently using compensatory strategies with recommendations to continue Dys 3 with thin liquids.    Assessment / Plan / Recommendation  Clinical Impression  Pt is consistently exhibiting signs that are clinically concerning for aspiration and is currently at increased risk for adverse events in its presence. Recommend NPO status be maintained with the exception of ice chips with frequent oral care and meds crushed in puree. Repeating an MBS is necessary prior to resuming POs given current presentation in addition to potential esophageal component and h/o dysphagia. NP advises deferring until GOC are established. Will follow to determine needs.   Pt continues to eat bite-sized solids with thin liquids at baseline, noting more frequent coughing as well as regurgitation. He was recently seen by GI, who scheduled an EGD with possible esophageal dilation 12/16. Pt presents with a congested cough but is generally unable to expectorate secretions. Provided thorough oral care to few remaining dentition, which are in poor condition. Sips of water were followed by prolonged coughing, which continued throughout the entirety of the session with all subsequent consistencies observed. He became tachypneic (RR 40-50), which is suspected to further impact his ability to coordinate breathing and swallowing.  SLP Visit Diagnosis: Dysphagia, unspecified (R13.10)    Aspiration Risk  Moderate aspiration risk    Diet Recommendation NPO except meds;Ice chips PRN after oral care    Medication Administration: Crushed with puree    Other  Recommendations Oral Care Recommendations: Oral care QID;Oral care prior to ice chip/H20     Assistance Recommended at Discharge    Functional  Status Assessment Patient has had a recent decline in their functional status and demonstrates the ability to make significant improvements in function in a reasonable and predictable amount of time.  Frequency and Duration min 2x/week  2 weeks       Prognosis Prognosis for improved oropharyngeal function: Fair Barriers to Reach Goals: Time post onset      Swallow Study   General HPI: 68 yo male with history of stage IV SCC of tongue with mets to lung s/p chemo and XRT, T2DM, prior CVA with L hemiparesis, seizure disorder, CKD 3B, HLD, HTN, A-fib on Eliquis , HFimpEF who presents to the ED 11/6 after a fall. Found to have a large L sided pleural effusion and in A-fib with RVR, requiring increased O2 via NRB/BiPAP. CTH shows large suspected mass in the anterior aspect of the R middle cranial fossa, MRI pending. CT Cervical Spine shows severe multilevel degenerative changes including degenerative disc disease and facet and uncovertebral hypertrophy with varying degrees of neural foraminal stenosis as well as likely moderate canal stenosis at C5-C6. He reports frequent regurgitation PTA, EGD with esophageal dilation planned for 02/04/24. MBS 10/26/21 with mild-moderate pharyngeal dysphagia suspected to be related to post-radiation changes with what appears to be edematous tissue, particularly noted at the epiglottis and arytenoids. Penetration occurred with all liquids and even purees with thin liquids passing below the vocal  folds, resulting in sensed aspiration as evidenced by a throat clear (PAS 7). Given that penetration is likely with thicker liquids (and aspiration possible across a meal), there is increased residue, and pt has remained stable from a pulmonary standpoint, recommend continuing with thin liquids in the setting of thorough oral care and implementation of a cough or more effortful throat clear post-swallow. Seen clinically by SLP January 2025, when he was independently using compensatory  strategies with recommendations to continue Dys 3 with thin liquids. Type of Study: Bedside Swallow Evaluation Previous Swallow Assessment: see HPI Diet Prior to this Study: NPO Temperature Spikes Noted: No Respiratory Status: Nasal cannula History of Recent Intubation: No Behavior/Cognition: Alert;Cooperative Oral Cavity Assessment: Within Functional Limits Oral Care Completed by SLP: Yes Oral Cavity - Dentition: Poor condition;Missing dentition Vision: Functional for self-feeding Self-Feeding Abilities: Needs assist Patient Positioning: Upright in bed Baseline Vocal Quality: Normal Volitional Cough: Strong;Congested Volitional Swallow: Able to elicit    Oral/Motor/Sensory Function Overall Oral Motor/Sensory Function: Within functional limits   Ice Chips Ice chips: Impaired Presentation: Spoon Pharyngeal Phase Impairments: Wet Vocal Quality;Cough - Immediate   Thin Liquid Thin Liquid: Impaired Presentation: Spoon;Straw Pharyngeal  Phase Impairments: Multiple swallows;Wet Vocal Quality;Cough - Immediate;Change in Vital Signs    Nectar Thick Nectar Thick Liquid: Not tested   Honey Thick Honey Thick Liquid: Not tested   Puree Puree: Impaired Presentation: Spoon Pharyngeal Phase Impairments: Cough - Immediate   Solid     Solid: Not tested      Damien Blumenthal, M.A., [name] Speech Language Pathology, Acute Rehabilitation Services  Secure Chat preferred [phone]  12/27/2023,11:15 AM

## 2023-12-27 NOTE — Progress Notes (Signed)
 OT Cancellation Note  Patient Details Name: Brian [name] MRN: [id number] DOB: May 10, 1955   Cancelled Treatment:    Reason Eval/Treat Not Completed: Patient at procedure or test/ unavailable Currently off floor for MRI. Noted GOC meeting planned for today as well. Will attempt follow-up this afternoon as schedule permits for OT eval and as indicated pending care plans.   Mliss Fish 12/27/2023, 11:39 AM

## 2023-12-27 NOTE — TOC Progression Note (Addendum)
 Transition of Care [facility]) - Progression Note    Patient Details  Name: Brian [name] MRN: [id number] Date of Birth: 07-Dec-1955  Transition [facility]) CM/SW Contact  Brian [name], LCSWA Phone Number: 12/27/2023, 5:15 PM  Clinical Narrative:     CSW spoke with patients son Brian [name] regarding patients dc plan. Patients son Brian [name] informed CSW that PTA patient was living with a friend. MD informed CSW that she discussed with son possible LTC SNF with hospice services vrs, residential hospice  and request for CSW to follow up. CSW spoke with patients son who is agreeable for CSW to fax out patients initial referral for possible SNF LTC Bed to see what LTC bed offers patient receives.Patients son also in agreement with hospice services to follow patient at SNF, if plan for SNF.Patients son informed CSW number one choice for SNF is Brian [name].Patients son also informed CSW that he would be agreeable to residential hospice placement for patient if it is recommended for patient.CSW plans to follow up with patients son with patients SNF bed offers/patients dc plan once SNF bed offers are in. CSW discussed insurance authorization process All questions answered.No further questions reported at this time. CSW informed MD and CM.CSW to continue to follow and assist with discharge planning needs.   Expected Discharge Plan: Skilled Nursing Facility Barriers to Discharge: Continued Medical Work up               Expected Discharge Plan and Services In-house Referral: Clinical Social Work Discharge Planning Services: CM Consult Post Acute Care Choice: Skilled Nursing Facility Living arrangements for the past 2 months: Skilled Nursing Facility                                       Social Drivers of Health (SDOH) Interventions SDOH Screenings   Food Insecurity: No Food Insecurity (06/06/2023)  Housing: Low Risk  (06/06/2023)  Transportation Needs: Unmet Transportation Needs (06/06/2023)   Utilities: Not At Risk (06/06/2023)  Alcohol Screen: Low Risk  (06/06/2023)  Depression (PHQ2-9): Low Risk  (10/31/2023)  Financial Resource Strain: Low Risk  (06/06/2023)  Physical Activity: Inactive (06/06/2023)  Social Connections: Socially Isolated (06/06/2023)  Stress: No Stress Concern Present (06/06/2023)  Tobacco Use: Low Risk  (12/10/2023)  Health Literacy: Adequate Health Literacy (06/06/2023)    Readmission Risk Interventions    11/08/2021    9:46 AM  Readmission Risk Prevention Plan  Transportation Screening Complete  PCP or Specialist Appt within 3-5 Days Complete  HRI or Home Care Consult Complete  Social Work Consult for Recovery Care Planning/Counseling Complete  Palliative Care Screening Not Applicable  Medication Review [employer]) Complete

## 2023-12-27 NOTE — Progress Notes (Signed)
 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  Patient is tachypneic, tachycardic, and hypertensive saturating 100% on BiPAP therapy.  Started on amiodarone infusion.  Results consistent with elevated creatinine, mild hypocalcemia, anemia imaging reviewed would likely intracranial metastasis.  Radiographically large pleural effusion  eICU Interventions  Patient is refusing thoracentesis, maintain supportive care with O2 and bipap if agreeable  Amiodarone bolus and infusion, anticoagulated with a heparin  drip-monitor for neurological changes  With concern of intracranial metastases and potential surrounding edema, MRI pending  DVT prophylaxis with heparin  infusion GI prophylaxis not currently indicated  Ongoing goals of care        Cathye Kreiter 12/27/2023, 6:27 AM

## 2023-12-27 NOTE — Progress Notes (Signed)
 PHARMACY - ANTICOAGULATION CONSULT NOTE  Pharmacy Consult for heparin  Indication: atrial fibrillation  Allergies  Allergen Reactions   Codeine Other (See Comments)    Unknown reaction, severe headach   Metformin  And Related Diarrhea    Patient Measurements: Height: 5' 5 (165.1 cm) Weight: 70 kg (154 lb 5.2 oz) IBW/kg (Calculated) : 61.5 HEPARIN  DW (KG): 70  Vital Signs: Temp: 98.1 F (36.7 C) (11/07 0055) Temp Source: Oral (11/07 0055) BP: 145/109 (11/07 0100) Pulse Rate: 146 (11/07 0100)  Labs: Recent Labs    12/26/23 2119 12/26/23 2141  HGB 10.9* 10.2*  HCT 32.0* 34.2*  PLT  --  272  CREATININE  --  1.31*  TROPONINIHS  --  42*    Estimated Creatinine Clearance: 45.6 mL/min (A) (by C-G formula based on SCr of 1.31 mg/dL (H)).   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 Alvia, laser therapy OD   Seizures (HCC)    pt reports this is from low blood sugar   Throat cancer [facility])    Assessment: 60 yoM presented s/p fall and found with AMS. Pharmacy consulted to dose heparin  for afib. PMH includes Afib (eliquis ), CKD, T2DM, HLD, HFimpEF.  -CBC shows Hgb 10s (bl~11-12), plts WNL -Last dose of eliquis : 11/6 -CXR: large left pleural effusion  Goal of Therapy:  Heparin  level 0.3-0.7 units/ml Monitor platelets by anticoagulation protocol: Yes   Plan:  No bolus given recent Eliquis  use Start heparin  infusion at 1050 units/hr Check aPTT and anti-Xa level in 8 hours and daily while on heparin  Monitor with aPTT until correlates with heparin  level Continue to monitor H&H and platelets F/u plans  for thoracentesis and restarting Eliquis    Lynwood Poplar, PharmD, BCPS Clinical Pharmacist 12/27/2023 1:59 AM

## 2023-12-27 NOTE — Progress Notes (Signed)
 NAME:  Brian [name], MRN:  [id number], DOB:  [birth year], LOS: 0 ADMISSION DATE:  12/26/2023, CONSULTATION DATE:  11/7 REFERRING MD:  Dr. 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  Patient presents to [facility] on 11/6 overnight as fall on blood thinners. States he was trying to get up from bed and lost his balance. Found to be hypoxic and placed on nrb. States never lost consciousness. Patient w/ tachypnea and rales bilaterally more diminished on left. CXR w/ large left pleural effusion; similar to that seen on CT of chest September 02, 2023. Vbg 7.51, 29, 117, 23. Placed on bipap for wob. Patient states he is DNI but okay w/ compressions. Patient denies receiving thoracentesis. States he does not want to be comfort care. Hgb stable 10.2. EKG w/ afib RVR. Trop 42 and bnp 991. Started on cardizem . Cards consulted and recommended amio. Wbc 5 and afebrile. LA 2.7. Cultures obtained and started on broad spectrum abx. CT head/c-spine no acute bleed or fracture; Large 3.3 cm suspected mass in anterior aspect of right middle cranial fossa; potential meningioma; exuberant edema in adjacent temporal lobe. PCCM consulted.     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 Alvia, laser therapy OD   Seizures [facility])    pt  reports this is from low blood sugar   Throat cancer (HCC)      Significant Hospital Events: Including procedures, antibiotic start and stop dates in addition to other pertinent events   11/7 admit w/ fall; hypoxic respiratory failure; afib rvr  Interim History / Subjective:  Patient sleeping states no respiratory distress on 2L [place] RR 20s HR afib rate 130-150s  Objective    Blood pressure 130/81, pulse (!) 137, temperature 98.2 F (36.8 C), temperature source Axillary, resp. rate (!) 24, height 5' 5 (1.651 m), weight 67.9 kg, SpO2 100%.    FiO2 (%):  [40 %] 40 % PEEP:  [5 cmH20] 5 cmH20   Intake/Output Summary (Last 24 hours) at 12/27/2023 0800 Last data filed at 12/27/2023 9476 Gross per 24 hour  Intake 2188.18 ml  Output --  Net 2188.18 ml   Filed Weights   12/26/23 2052 12/27/23 0600  Weight: 70 kg 67.9 kg    Examination: General: Frail adult male, resting in bed HEENT: [place]/AT, poor dentition Neuro: A & O, moves all extremities, slight weakness on the left CV: s1/s2, irregular, tachycardic, afib with rvr on telemetry, no edema, cool lower extremities PULM:  right lung clear, left lung diminished  GI: soft, bsx4 active  Extremities: left shin abrasion   Resolved problem list   Assessment and Plan   Acute respiratory failure w/ hypoxia Chronic left sided pleural  effusion:  -CT 09/02/2023; CT chest and recent CXR also showing irregular right perihilar soft tissue infectious and/or neoplastic process cannot be excluded -RPP negative Plan: -[place] and wean for sats >92%, wean as tolerated -pulm toiletry: is/flutter -rocephin /azithro >> low suspicion for CAP, no leukocytosis/fever or productive sputum.  His L pleural effusion is grossly unchanged from previous imaging. Will follow cultures  Afib rvr Hx of afib on eliquis  Elevated bnp and mildly elevated trop Hx of HFimpEF HTN/HLD Plan: -cardiology consulted overnight -cont amio -will hold heparin  with recent fall   -resume home statin  Fall: states he lost balance did not lose consciousness or hit head L knee abrasion: xray no acute fracture -CT head/c-spine no acute bleed or fracture Plan: -dressing for knee abrasion -pt/ot eval  Known SCC of throat with lung metastasis Concern for brain mets -- Evaluated by oncology in June, further chemotherapy would unlikely benefit patient and would reduce quality of life.  Recommended to focus on symptom management -- CTH with suspected mass in the anterior aspect of the right middle crania fossa, potential meningioma -- MRI Brain with new brain lesions -- start decadron  scheduled, will hold nsgy consult until plan of care discussed with son  T2dm  -A1c a month ago 7.9 Plan: -ssi and cbg monitoring  Hx of seizure disorder Hx of cva w/ left hemiparesis Plan: -PT/OT/SLP -resume home keppra  and statin  CKD 3b, stable Plan: -Trend BMP / urinary output -Replace electrolytes as indicated -Avoid nephrotoxic agents, ensure adequate renal perfusion  Nutrition: - Known dysphagia. Following with GI as outpatient and was planned for esophageal dilation earlier this year but does not appear this was completed.  GOC -has been seen by palliative care on 09/2023; decision then made to be DNR/DNI. Son Beverley is POA who lives in [place] . Estranged relationship w/ daughter. -palliative care consult -Patient updated on results of brain MRI- initiated discussions on goals of care - he says no one has every discussed comfort directed care or hospice with him.  He would prefer these decisions and discussions be held with his son and his son be the decision maker     Labs   CBC: Recent Labs  Lab 12/26/23 2119 12/26/23 2141  WBC  --  5.0  NEUTROABS  --  4.5  HGB 10.9* 10.2*  HCT 32.0* 34.2*  MCV  --  92.4  PLT  --  272    Basic Metabolic Panel: Recent Labs  Lab 12/26/23 2119 12/26/23 2141  NA 137 140  K 4.8 4.2  CL  --  104  CO2  --  21*  GLUCOSE   --  165*  BUN  --  31*  CREATININE  --  1.31*  CALCIUM   --  8.6*  MG  --  2.0   GFR: Estimated Creatinine Clearance: 45.6 mL/min (A) (by C-G formula based on SCr of 1.31 mg/dL (H)). Recent Labs  Lab 12/26/23 2108 12/26/23 2141 12/27/23 0114  WBC  --  5.0  --   LATICACIDVEN 2.7*  --  1.6    Liver Function Tests: Recent Labs  Lab 12/26/23 2141  AST 21  ALT 22  ALKPHOS 111  BILITOT 1.2  PROT 6.3*  ALBUMIN 2.3*   No results for input(s): LIPASE, AMYLASE in the last 168 hours. No results for input(s): AMMONIA in the last 168 hours.  ABG    Component Value Date/Time   HCO3 23.3 12/26/2023 2119   TCO2 24 12/26/2023 2119   ACIDBASEDEF 5.6 (  H) 02/26/2023 1850   O2SAT 99 12/26/2023 2119     Coagulation Profile: No results for input(s): INR, PROTIME in the last 168 hours.  Cardiac Enzymes: No results for input(s): CKTOTAL, CKMB, CKMBINDEX, TROPONINI in the last 168 hours.  HbA1C: Hgb A1c MFr Bld  Date/Time Value Ref Range Status  10/31/2023 12:27 PM 7.9 (H) <5.7 % Final    Comment:    For someone without known diabetes, a hemoglobin A1c value of 6.5% or greater indicates that they may have  diabetes and this should be confirmed with a follow-up  test. . For someone with known diabetes, a value <7% indicates  that their diabetes is well controlled and a value  greater than or equal to 7% indicates suboptimal  control. A1c targets should be individualized based on  duration of diabetes, age, comorbid conditions, and  other considerations. . Currently, no consensus exists regarding use of hemoglobin A1c for diagnosis of diabetes for children. SABRA   07/22/2023 12:03 PM 7.2 (H) <5.7 % Final    Comment:    For someone without known diabetes, a hemoglobin A1c value of 6.5% or greater indicates that they may have  diabetes and this should be confirmed with a follow-up  test. . For someone with known diabetes, a value <7% indicates  that their  diabetes is well controlled and a value  greater than or equal to 7% indicates suboptimal  control. A1c targets should be individualized based on  duration of diabetes, age, comorbid conditions, and  other considerations. . Currently, no consensus exists regarding use of hemoglobin A1c for diagnosis of diabetes for children. .     CBG: Recent Labs  Lab 12/27/23 0217 12/27/23 0414 12/27/23 0604 12/27/23 0719  GLUCAP 181* 158* 119* 110*    Critical care time: 30 minutes     Lucie Irving, NP [facility] 12/27/2023, 8:00 AM  Please see [email or web address] for pager details.  From 7A-7P if no response, please call [phone]. After hours, please call ELink [phone].

## 2023-12-27 NOTE — NC FL2 (Signed)
 [place]  MEDICAID FL2 LEVEL OF CARE FORM     IDENTIFICATION  Patient Name: Brian [name] Birthdate: 1955/07/25 Sex: male Admission Date (Current Location): 12/26/2023  [facility] and [place] Number:  Producer, Television/film/video and Address:  [facility] [place]. [facility], [address], [place], [place] [address]      Provider Number: [id number]  Attending Physician Name and Address:  Gretta Leita SQUIBB, DO  Relative Name and Phone Number:  Jaylin Benzel (son) [phone]    Current Level of Care: Hospital Recommended Level of Care: Skilled Nursing Facility Prior Approval Number:    Date Approved/Denied:   PASRR Number: [id number]  Discharge Plan: SNF    Current Diagnoses: Patient Active Problem List   Diagnosis Date Noted   A-fib [facility]) 12/27/2023   Leg swelling 06/18/2023   Type 2 diabetes mellitus with complication, without long-term current use of insulin  (HCC) 06/18/2023   Pain due to onychomycosis of toenails of both feet 03/25/2023   Coffee ground emesis 03/01/2023   Nausea and vomiting 03/01/2023   Ileus (HCC) 02/28/2023   Debility 02/28/2023   AKI (acute kidney injury) 02/28/2023   Adjustment disorder with depressed mood 02/28/2023   Marijuana use 02/28/2023   Symptomatic bradycardia 02/26/2023   Atrioventricular block, complete (HCC) 02/20/2023   Seizure (HCC) 01/08/2023   Pericardial effusion 01/07/2023   Seizure-like activity (HCC) 01/07/2023   Gait disturbance 01/07/2023   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) 11/18/2022   Pleural effusion 10/30/2022   History of head and neck cancer 10/17/2022   Chronic congestive heart failure with left ventricular diastolic dysfunction (HCC) 11/19/2021   History of CVA with residual deficit 11/03/2021   Hypokalemia 11/03/2021   Dysphagia 11/03/2021   Fall at home, subsequent encounter 11/03/2021   Diastolic dysfunction  11/03/2021   Pancytopenia (HCC) 11/03/2021   Primary cancer of left lower lobe of lung (HCC) 06/13/2021   Normocytic normochromic anemia 03/20/2021   Stage 3b chronic kidney disease (HCC) 03/13/2021   Syncope and collapse 02/02/2021   Dilated cardiomyopathy (HCC) 02/02/2021   Weight loss, unintentional 05/26/2020   Insomnia 05/09/2020   Chronic kidney disease, stage III (moderate) (HCC) 05/09/2020   Chemotherapy induced diarrhea 05/09/2020   Malignant neoplasm of base of tongue (HCC) 03/02/2020   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 [facility])    Status epilepticus (HCC) 01/18/2013   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 status epilepticus, not intractable (HCC) 01/15/2013   Hyperlipidemia    Long term current use of anticoagulant 03/11/2010   Diabetes mellitus, type II (HCC) 12/20/2009   Essential hypertension, benign 12/20/2009   Atrial fibrillation (HCC) 12/20/2009   Systolic heart failure (HCC) 12/20/2009   CEREBROVASCULAR ACCIDENT 12/20/2009    Orientation RESPIRATION BLADDER Height & Weight     Self, Place, Situation  O2 (Nasal Cannula 2 liters)   Weight: 149 lb 11.1 oz (67.9 kg) Height:  5' 5 (165.1 cm)  BEHAVIORAL SYMPTOMS/MOOD NEUROLOGICAL BOWEL NUTRITION STATUS        Diet (Please see discharge summary)  AMBULATORY STATUS COMMUNICATION OF NEEDS Skin     Verbally Other (Comment) (Abrasion,Leg,Left,Cleansed,Erythema,Arm,Bil.,Wound/Incision LDAs)                       Personal Care Assistance Level of Assistance              Functional Limitations Info  Hearing, Speech   Hearing Info: Adequate Speech Info: Adequate  SPECIAL CARE FACTORS FREQUENCY                       Contractures Contractures Info: Not present    Additional Factors Info  Code Status, Allergies, Psychotropic Code Status Info: DNR Allergies Info: Codeine,Metformin  And Related Psychotropic Info: levETIRAcetam  (KEPPRA ) undiluted injection 500 mg every 12 hours         Current Medications  (12/27/2023):  This is the current hospital active medication list Current Facility-Administered Medications  Medication Dose Route Frequency Provider Last Rate Last Admin   0.9 %  sodium chloride  infusion   Intravenous Continuous Gretta Leita SQUIBB, DO       acetaminophen  (TYLENOL ) tablet 650 mg  650 mg Oral Q6H PRN Gretta Leita SQUIBB, DO       Or   acetaminophen  (TYLENOL ) suppository 650 mg  650 mg Rectal Q6H PRN Gretta Leita SQUIBB, DO       artificial tears ophthalmic solution 1 drop  1 drop Both Eyes QID PRN Gretta Leita SQUIBB, DO       Chlorhexidine  Gluconate Cloth 2 % PADS 6 each  6 each Topical Daily [name], John D, PA-C   6 each at 12/27/23 0945   dexamethasone  (DECADRON ) injection 4 mg  4 mg Intravenous Q6H Hicks, Samantha B, NP   4 mg at 12/27/23 1714   diphenhydrAMINE (BENADRYL) injection 25 mg  25 mg Intravenous Q4H PRN Gretta Leita P, DO       docusate sodium (COLACE) capsule 100 mg  100 mg Oral BID PRN [name], John D, PA-C       fentaNYL  (SUBLIMAZE ) injection 25-100 mcg  25-100 mcg Intravenous Q30 min PRN Gretta Leita SQUIBB, DO       glycopyrrolate (ROBINUL) tablet 1 mg  1 mg Oral Q4H PRN Gretta Leita SQUIBB, DO       Or   glycopyrrolate (ROBINUL) injection 0.2 mg  0.2 mg Subcutaneous Q4H PRN Gretta Leita P, DO       Or   glycopyrrolate (ROBINUL) injection 0.2 mg  0.2 mg Intravenous Q4H PRN Gretta Leita P, DO       guaiFENesin  (ROBITUSSIN) 100 MG/5ML liquid 10 mL  10 mL Oral Q4H PRN [name], John D, PA-C       ipratropium-albuterol  (DUONEB) 0.5-2.5 (3) MG/3ML nebulizer solution 3 mL  3 mL Nebulization Q6H Gretta Leita P, DO   3 mL at 12/27/23 1639   levETIRAcetam  (KEPPRA ) undiluted injection 500 mg  500 mg Intravenous Q12H [name], John D, PA-C   500 mg at 12/27/23 1714   midazolam  PF (VERSED ) injection 2-4 mg  2-4 mg Intravenous Q4H PRN Gretta Leita P, DO       ondansetron  (ZOFRAN -ODT) disintegrating tablet 4 mg  4 mg Oral Q6H PRN Gretta Leita SQUIBB, DO       Or   ondansetron  (ZOFRAN ) injection 4 mg  4 mg  Intravenous Q6H PRN Gretta Leita SQUIBB, DO       Oral care mouth rinse  15 mL Mouth Rinse 4 times per day Gretta Leita P, DO   15 mL at 12/27/23 1553   Oral care mouth rinse  15 mL Mouth Rinse PRN Gretta Leita SQUIBB, DO         Discharge Medications: Please see discharge summary for a list of discharge medications.  Relevant Imaging Results:  Relevant Lab Results:   Additional Information [id number]  Isaiah Public, LCSWA

## 2023-12-28 DIAGNOSIS — E1122 Type 2 diabetes mellitus with diabetic chronic kidney disease: Secondary | ICD-10-CM

## 2023-12-28 DIAGNOSIS — C140 Malignant neoplasm of pharynx, unspecified: Secondary | ICD-10-CM

## 2023-12-28 DIAGNOSIS — C7931 Secondary malignant neoplasm of brain: Secondary | ICD-10-CM

## 2023-12-28 DIAGNOSIS — I2699 Other pulmonary embolism without acute cor pulmonale: Secondary | ICD-10-CM

## 2023-12-28 DIAGNOSIS — C7800 Secondary malignant neoplasm of unspecified lung: Principal | ICD-10-CM

## 2023-12-28 DIAGNOSIS — C782 Secondary malignant neoplasm of pleura: Secondary | ICD-10-CM

## 2023-12-28 DIAGNOSIS — N1832 Chronic kidney disease, stage 3b: Secondary | ICD-10-CM

## 2023-12-28 DIAGNOSIS — D649 Anemia, unspecified: Secondary | ICD-10-CM

## 2023-12-28 DIAGNOSIS — R55 Syncope and collapse: Secondary | ICD-10-CM | POA: Diagnosis not present

## 2023-12-28 DIAGNOSIS — J910 Malignant pleural effusion: Secondary | ICD-10-CM

## 2023-12-28 DIAGNOSIS — I480 Paroxysmal atrial fibrillation: Secondary | ICD-10-CM | POA: Diagnosis not present

## 2023-12-28 MED ORDER — FENTANYL BOLUS VIA INFUSION: 100.0000 ug | INTRAVENOUS | Status: DC | PRN

## 2023-12-28 MED ORDER — FENTANYL 2500MCG IN NS 250ML (10MCG/ML) PREMIX INFUSION
0.0000 ug/h | INTRAVENOUS | Status: DC
  Administered 2023-12-29: 50 ug/h via INTRAVENOUS
  Filled 2023-12-28 (×2): qty 250

## 2023-12-28 NOTE — Progress Notes (Signed)
 [facility] [phone] [facility] Liaison Note  Received request from [facility] for hospice services at facility after discharge. Spoke with patient, daughter, and son  to initiate education related to hospice philosophy, services and team approach to care. Patient and family verbalized understanding of information given. Per discussion, the plan is to see if patient can go to [facility].   DME needs discussed. Will know what needs are once facility is chosen.   Please send signed and completed DNR with patient. Please provide prescriptions at discharge as needed to ensure ongoing symptom management.   [facility] information and contact numbers given to daughter and son.  Please call with any concerns.   Thank you for the opportunity to participate in this patient's care.    Nat Babe, BSN, RN [employer] [phone]

## 2023-12-28 NOTE — Progress Notes (Signed)
 NAME:  Brian [name], MRN:  [id number], DOB:  1955/06/29, LOS: 1 ADMISSION DATE:  12/26/2023, CONSULTATION DATE:  11/7 REFERRING MD:  Dr. 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  Patient presents to [facility] on 11/6 overnight as fall on blood thinners. States he was trying to get up from bed and lost his balance. Found to be hypoxic and placed on nrb. States never lost consciousness. Patient w/ tachypnea and rales bilaterally more diminished on left. CXR w/ large left pleural effusion; similar to that seen on CT of chest September 02, 2023. Vbg 7.51, 29, 117, 23. Placed on bipap for wob. Patient states he is DNI but okay w/ compressions. Patient denies receiving thoracentesis. States he does not want to be comfort care. Hgb stable 10.2. EKG w/ afib RVR. Trop 42 and bnp 991. Started on cardizem . Cards consulted and recommended amio. Wbc 5 and afebrile. LA 2.7. Cultures obtained and started on broad spectrum abx. CT head/c-spine no acute bleed or fracture; Large 3.3 cm suspected mass in anterior aspect of right middle cranial fossa; potential meningioma; exuberant edema in adjacent temporal lobe. PCCM consulted.     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 Alvia, laser therapy OD   Seizures [facility])    pt  reports this is from low blood sugar   Throat cancer (HCC)      Significant Hospital Events: Including procedures, antibiotic start and stop dates in addition to other pertinent events   11/7 admit w/ fall; hypoxic respiratory failure; afib rvr  Interim History / Subjective:  Today he feels poorly. He cannot describe why exactly.   Objective    Blood pressure (!) 141/108, pulse (!) 135, temperature 98.2 F (36.8 C), temperature source Oral, resp. rate (!) 29, height 5' 5 (1.651 m), weight 67.9 kg, SpO2 100%.    FiO2 (%):  [28 %] 28 %   Intake/Output Summary (Last 24 hours) at 12/28/2023 0936 Last data filed at 12/27/2023 1600 Gross per 24 hour  Intake 193.27 ml  Output --  Net 193.27 ml   Filed Weights   12/26/23 2052 12/27/23 0600  Weight: 70 kg 67.9 kg    Examination: General: chronically ill appearing man lying in bed in NAD HEENT: [place]/AT Neuro: Awake, talking to his daughter.  Answering questions appropriately. CV: S1-S2, tachycardic, regular rhythm PULM: Breathing comfortably on nasal cannula, reduced left breath sounds.  No accessory muscle use. Remedies: No significant edema.   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  Family is happy about this because it will facilitate  easier family visitation. -Continue Keppra  to facilitate comfort -Discussed with TOC-waiting for SNF placement with hospice.      Critical care time:  n/a     Leita SHAUNNA Gaskins, DO 12/28/23 6:10 PM [facility]  For contact information, see Amion. If no response to pager, please call PCCM consult pager. After hours, 7PM- 7AM, please call Elink.

## 2023-12-28 NOTE — Plan of Care (Signed)
   Problem: Pain Managment: Goal: General experience of comfort will improve and/or be controlled Outcome: Progressing   Problem: Skin Integrity: Goal: Risk for impaired skin integrity will decrease Outcome: Progressing

## 2023-12-28 NOTE — Plan of Care (Signed)
°  Problem: Education: Goal: Knowledge of General Education information will improve Description: Including pain rating scale, medication(s)/side effects and non-pharmacologic comfort measures Outcome: Progressing   Problem: Clinical Measurements: Goal: Cardiovascular complication will be avoided Outcome: Progressing   Problem: Activity: Goal: Risk for activity intolerance will decrease Outcome: Progressing   Problem: Coping: Goal: Level of anxiety will decrease Outcome: Progressing

## 2023-12-29 DIAGNOSIS — I480 Paroxysmal atrial fibrillation: Secondary | ICD-10-CM

## 2023-12-29 DIAGNOSIS — Z7189 Other specified counseling: Secondary | ICD-10-CM

## 2023-12-29 DIAGNOSIS — Z515 Encounter for palliative care: Secondary | ICD-10-CM

## 2023-12-29 MED ORDER — GLYCOPYRROLATE 0.2 MG/ML IJ SOLN: 0.4000 mg | INTRAMUSCULAR | Status: DC | PRN

## 2023-12-29 MED ORDER — LORAZEPAM 2 MG/ML IJ SOLN
0.5000 mg | Freq: Four times a day (QID) | INTRAMUSCULAR | Status: DC
  Administered 2023-12-29 – 2023-12-30 (×5): 0.5 mg via INTRAVENOUS
  Filled 2023-12-29 (×5): qty 1

## 2023-12-29 MED ORDER — GLYCOPYRROLATE 0.2 MG/ML IJ SOLN
0.4000 mg | INTRAMUSCULAR | Status: DC
  Administered 2023-12-29 – 2023-12-30 (×6): 0.4 mg via INTRAVENOUS
  Filled 2023-12-29 (×6): qty 2

## 2023-12-29 MED ORDER — SCOPOLAMINE 1 MG/3DAYS TD PT72
1.0000 | MEDICATED_PATCH | TRANSDERMAL | Status: DC
  Administered 2023-12-29: 1 mg via TRANSDERMAL
  Filled 2023-12-29: qty 1

## 2023-12-29 NOTE — Consult Note (Signed)
 Palliative Medicine Inpatient Consult Note  Consulting Provider:  Vinie Lucie NOVAK, NP   Reason for consult:   Palliative Care Consult Services Palliative Medicine Consult  Reason for Consult? 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 Palliative care requested to support conversations regarding goals of care - patient and his son had made the decision to pursue SNF with hospice care.   Clinical Assessment/Goals of Care:  *Please note that this is a verbal dictation therefore any spelling or grammatical errors are due to the Dragon Medical One system interpretation.  I have reviewed medical records including EPIC notes, labs and imaging, received report from bedside RN, assessed the patient who was uncomfortable in bed and asked for his RN, Brian [name] and myself to help reposition him.  We were able to move him from his right side to his left side which did result in increased comfort..    I met with Brian [name] to further discuss diagnosis prognosis, GOC, EOL wishes, disposition and options.   I introduced Palliative Medicine as specialized medical care for people living with serious illness. It focuses on providing relief from the symptoms and stress of a serious illness. The goal is to improve quality of life for both the patient and the family.  Medical History Review and Understanding:  Brian [name] and I reviewed his past medical history significant for chronic kidney disease, previous stroke, stage IV squamous cell carcinoma of the tongue with metastases to the lung, type 2 diabetes mellitus, hypertension, hyperlipidemia, seizure disorder, and atrial fibrillation.  Social History:  Brian [name] shares that he lives in  [place], [place] .  He is divorced.  He has a son and a daughter.  He has 6 grandchildren. strained relationship with his daughter, who lives in [place], and has not seen his youngest granddaughter. His son lives in [place], [place] , and works for [employer]. He formerly worked in the [employer].  He shares he enjoyed being outside in the wilderness.  He is a man of faith practicing within the [employer] denomination.  Functional and Nutritional State:  He lived with his friend Brian [name] and Brian [name]'s girlfriend's son, though only Brian [name] has provided assistance, such as helping him tie his pants.  He utilized a FWW. Had previously been to SNF - most recently [facility].   Palliative Symptoms:  Dyspnea - notable at rest. Patient and I discussed medications such as a fentanyl  drip to help the symptoms which she is agreeable to.  Advance Directives:  A detailed discussion was had today regarding advanced directives.  Brian [name] has no advanced directives though he does share with me he would rely on his son Brian [name] for decisions if he were unable to speak for himself.  Code Status:  Concepts specific to code status, artifical feeding and hydration, continued IV antibiotics and rehospitalization was had.  The difference between a aggressive medical intervention path  and a palliative comfort care path for this patient at this time was had.   Confirmed the desire to be DO NOT RESUSCITATE DO NOT INTUBATE.  Discussion:  Brian [name] and I discussed the reason for his hospitalization inclusive of him being short of breath and in atrial fibrillation with RVR as a  result of a large pleural effusion.  We reviewed this pleural effusion is likely in the setting of metastatic disease as well as multiple lesions identified on the brain.  We reviewed the determination yesterday with Dr. Gretta to focus on comfort and the quality of time that Brian [name] has left.  We talked about transition to comfort  measures in house and what that would entail inclusive of medications to control pain, dyspnea, agitation, nausea, itching, and hiccups.  We discussed stopping all uneccessary measures such as cardiac monitoring, blood draws, needle sticks, and frequent vital signs.   We discussed more specifically in Brian [name]'s case that a fentanyl  drip would be instituted to support his work of breathing.  I discussed with him the fentanyl  drip will help with dyspnea though I will also be likely to make him more lethargic and less interactive.  He understood these risks and would like to pursue this intervention.  I asked if I could call his son to verify he 2 was all right with this which she was agreeable towards.  Utilized reflective listening throughout our time together as Brian [name] shares he has lived a hard life.  I asked him if he would like to speak to our chaplain to provide support though at this time he has declined.  Brian and I confirm the goal for him to be comfortable and to have a peaceful passing from this life into the next.  Discussed the importance of continued conversation with family and their  medical providers regarding overall plan of care and treatment options, ensuring decisions are within the context of the patients values and GOCs. ____________________ Addendum:  I called patient's son, Brian [name] and discussed patient's increased respiratory rate and his desire to be initiated on a fentanyl  drip.  Patient's son shares the goal of comfort as well and is agreeable towards this intervention.  Decision Maker: Brian [name] (Son): [phone] (Mobile)   SUMMARY OF RECOMMENDATIONS   DNAR/DNI  Comfort care  Initiate low-dose fentanyl  drip with boluses as needed patient and his son have both agreed  Additional medications per [name]  Unrestricted visitation  Patient has declined chaplain support at this time  Will see how Brian [name] feels in the next 24 hours to determine potential  placement --> I do worry [place] is close to end-of-life and his symptom needs are more likely to be better addressed at an inpatient hospice facility  Ongoing palliative care support  Code Status/Advance Care Planning: DNAR/DNI  Palliative Prophylaxis:  Aspiration, Bowel Regimen, Delirium Protocol, Frequent Pain Assessment, Oral Care, Palliative Wound Care, and Turn Reposition  Additional Recommendations (Limitations, Scope, Preferences): Continue current care -end-of-life  Psycho-social/Spiritual:  Desire for further Chaplaincy support: None at this time Additional Recommendations: Education on end-of-life symptom management   Prognosis: Limited to days  Discharge Planning: Discharge plan to be determined  Vitals:   12/28/23 1257 12/29/23 0652  BP: (!) 105/31 (!) 120/103  Pulse: (!) 148 (!) 137  Resp: (!) 32 20  Temp: 97.8 F (36.6 C) 97.7 F (36.5 C)  SpO2: (!) 86% 100%    Intake/Output Summary (Last 24 hours) at 12/29/2023 0717 Last data filed at 12/28/2023 1900 Gross per 24 hour  Intake 120 ml  Output 0 ml  Net 120 ml   Last Weight  Most recent update: 12/27/2023  6:41 AM    Weight  67.9 kg (149 lb 11.1 oz)             LABS: CBC:  Component Value Date/Time   WBC 3.9 (L) 12/27/2023 1315   HGB 9.8 (L) 12/27/2023 1315   HGB 11.9 (L) 09/16/2020 1540   HCT 32.9 (L) 12/27/2023 1315   PLT 220 12/27/2023 1315   PLT 161 09/16/2020 1540   MCV 93.5 12/27/2023 1315   NEUTROABS 4.5 12/26/2023 2141   LYMPHSABS 0.1 (L) 12/26/2023 2141   MONOABS 0.4 12/26/2023 2141   EOSABS 0.0 12/26/2023 2141   BASOSABS 0.0 12/26/2023 2141   Comprehensive Metabolic Panel:    Component Value Date/Time   NA 139 12/27/2023 1315   NA 139 09/01/2021 1014   K 3.7 12/27/2023 1315   CL 104 12/27/2023 1315   CO2 20 (L) 12/27/2023 1315   BUN 26 (H) 12/27/2023 1315   BUN 17 09/01/2021 1014   CREATININE 1.31 (H) 12/27/2023 1315   CREATININE 1.73 (H) 10/31/2023 1227   GLUCOSE 126  (H) 12/27/2023 1315   CALCIUM  8.4 (L) 12/27/2023 1315   AST 21 12/26/2023 2141   AST 14 (L) 03/20/2021 0909   ALT 22 12/26/2023 2141   ALT 13 03/20/2021 0909   ALKPHOS 111 12/26/2023 2141   BILITOT 1.2 12/26/2023 2141   BILITOT 0.3 03/20/2021 0909   PROT 6.3 (L) 12/26/2023 2141   ALBUMIN 2.3 (L) 12/26/2023 2141   Gen: Elderly chronically ill-appearing Caucasian male HEENT: Dry mucous membranes CV: Irregular rate and rhythm PULM: On 2 L nasal cannula breathing is labored ABD: soft/nontender EXT: No edema Neuro: Alert and oriented x3  PPS: 10%   This conversation/these recommendations were discussed with patient primary care team, Dr. Donnamarie ______________________________________________________ [name] Becton [facility] Palliative Medicine Team Team Cell Phone: [phone] Please utilize secure chat with additional questions, if there is no response within 30 minutes please call the above phone number  Total Time: 75 Billing based on MDM: High  Palliative Medicine Team providers are available by phone from 7am to 7pm daily and can be reached through the team cell phone.  Should this patient require assistance outside of these hours, please call the patient's attending physician.

## 2023-12-29 NOTE — Progress Notes (Signed)
 SLP Cancellation Note  Patient Details Name: Brian [name] MRN: [id number] DOB: 1955/07/12   Cancelled treatment:       Reason Eval/Treat Not Completed: Pt and his family have decided to pursue comfort measures. Ongoing SLP interventions are not necessary, will sign off. Please re-consult if acute needs arise.    Damien Blumenthal, M.A., [name] Speech Language Pathology, Acute Rehabilitation Services  Secure Chat preferred [phone]  12/29/2023, 7:22 AM

## 2023-12-29 NOTE — Progress Notes (Signed)
 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 In the ED, CXR w/ large left pleural effusion; similar to that seen on CT of chest September 02, 2023. Placed on bipap for wob. Hgb stable 10.2. EKG w/ afib RVR. Trop 42 and bnp 991. Started on cardizem . Cards consulted. LA 2.7. Cultures obtained and started on broad spectrum abx. CT head/c-spine no acute bleed or fracture; Large 3.3 cm suspected mass in anterior aspect of right middle cranial fossa; potential meningioma; exuberant edema in adjacent temporal lobe. PCCM admitted in the ICU for further management.  While in the ICU, patient was found to have acute PE in addition with A-fib with RVR and a large left pleural effusion.  Goals of care family meeting on 11/7 was held with POA son Brian [name] who agreed to switch patient to DNR with comfort care.  Plan for residential hospice versus SNF with hospice.  [facility] assumed care on 12/29/2023     Today, patient reported being in pain, started on fentanyl  drip as discussed with palliative and son. Resting comfortably while I was at bedside.  Remains tachycardic   Assessment/Plan: Principal Problem:   A-fib (HCC)   Acute respiratory failure w/ hypoxia  Large left pleural effusion Acute PE in the setting of malignancy Likely 2/2 acute PE in addition to large left pleural effusion CTA chest showed PE within the proximal left lower lobe pulmonary artery consistent with acute or subacute thrombus.  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 In the setting of history of stage IV squamous cell carcinoma of the tongue with metastases to lung, pleura CT head with new large suspected mass in the anterior aspect of the right middle cranial fossa not well-characterized MRI brain showed multiple brain lesions most likely metastatic with associated vasogenic edema, largest is 3 x 4 x 3.6 cm mass in the right anterior temporal lobe Switched to comfort care  Fall vs syncope Multifactorial due to above   Hx of seizure disorder Continue IV Keppra  given new findings of brain mets in addition to hopefully keeping patient comfortable by preventing seizures  Hx of cva w/ left hemiparesis  CKD stage IIIb  Anemia of CKD     Estimated body mass index is 24.91 kg/m as calculated from the following:   Height as of this encounter: 5' 5 (1.651 m).   Weight as of this encounter: 67.9 kg.     Code Status: DNR/DNI  Family Communication: None at bedside  Disposition Plan: Status is: Inpatient Remains inpatient appropriate because: Comfort care      Consultants: PCCM Cardiology Palliative/hospice  Procedures: None  Antimicrobials: None  DVT prophylaxis: None   Objective: Vitals:   12/28/23 0900 12/28/23 1000 12/28/23 1257 12/29/23 0652  BP:   (!) 105/31 (!) 120/103  Pulse: (!) 148 ROLLEN)  147 (!) 148 (!) 137  Resp: (!) 29 (!) 27 (!) 32 20  Temp:   97.8 F (36.6 C) 97.7 F (36.5 C)  TempSrc:   Oral Oral  SpO2: 99% 99% (!) 86% 100%  Weight:      Height:        Intake/Output Summary (Last 24 hours) at 12/29/2023 1012 Last data filed at 12/28/2023 1900 Gross per 24 hour  Intake 120 ml  Output 0 ml  Net 120 ml   Filed Weights   12/26/23 2052 12/27/23 0600   Weight: 70 kg 67.9 kg    Exam:  General: Sleeping comfortably in bed Cardiovascular: S1, S2 present Respiratory: Minich breath sounds bilaterally Abdomen: Soft, nontender, nondistended, bowel sounds present Musculoskeletal: No bilateral pedal edema noted Skin: Normal Psychiatry: Unable to assess   Data Reviewed: CBC: Recent Labs  Lab 12/26/23 2119 12/26/23 2141 12/27/23 0857 12/27/23 1315  WBC  --  5.0 3.8* 3.9*  NEUTROABS  --  4.5  --   --   HGB 10.9* 10.2* 9.6* 9.8*  HCT 32.0* 34.2* 31.0* 32.9*  MCV  --  92.4 91.7 93.5  PLT  --  272 221 220   Basic Metabolic Panel: Recent Labs  Lab 12/26/23 2119 12/26/23 2141 12/27/23 0857 12/27/23 1315  NA 137 140 140 139  K 4.8 4.2 4.1 3.7  CL  --  104 105 104  CO2  --  21* 23 20*  GLUCOSE  --  165* 118* 126*  BUN  --  31* 27* 26*  CREATININE  --  1.31* 1.39* 1.31*  CALCIUM   --  8.6* 8.4* 8.4*  MG  --  2.0  --   --    GFR: Estimated Creatinine Clearance: 45.6 mL/min (A) (by C-G formula based on SCr of 1.31 mg/dL (H)). Liver Function Tests: Recent Labs  Lab 12/26/23 2141  AST 21  ALT 22  ALKPHOS 111  BILITOT 1.2  PROT 6.3*  ALBUMIN 2.3*   No results for input(s): LIPASE, AMYLASE in the last 168 hours. No results for input(s): AMMONIA in the last 168 hours. Coagulation Profile: No results for input(s): INR, PROTIME in the last 168 hours. Cardiac Enzymes: No results for input(s): CKTOTAL, CKMB, CKMBINDEX, TROPONINI in the last 168 hours. BNP (last 3 results) No results for input(s): PROBNP in the last 8760 hours. HbA1C: No results for input(s): HGBA1C in the last 72 hours. CBG: Recent Labs  Lab 12/27/23 0217 12/27/23 0414 12/27/23 0604 12/27/23 0719 12/27/23 1334  GLUCAP 181* 158* 119* 110* 125*   Lipid Profile: No results for input(s): CHOL, HDL, LDLCALC, TRIG, CHOLHDL, LDLDIRECT in the last 72 hours. Thyroid  Function Tests: No results for input(s): TSH,  T4TOTAL, FREET4, T3FREE, THYROIDAB in the last 72 hours. Anemia Panel: No results for input(s): VITAMINB12, FOLATE, FERRITIN, TIBC, IRON, RETICCTPCT in the last 72 hours. Urine analysis:    Component Value Date/Time   COLORURINE YELLOW 11/24/2023 0300   APPEARANCEUR CLEAR 11/24/2023 0300   LABSPEC 1.017 11/24/2023 0300   PHURINE 5.0 11/24/2023 0300   GLUCOSEU 50 (A) 11/24/2023 0300   HGBUR NEGATIVE 11/24/2023 0300   BILIRUBINUR NEGATIVE 11/24/2023 0300   KETONESUR NEGATIVE 11/24/2023 0300   PROTEINUR 30 (A) 11/24/2023 0300   UROBILINOGEN 0.2 01/17/2013 0623   NITRITE NEGATIVE 11/24/2023 0300   LEUKOCYTESUR NEGATIVE 11/24/2023 0300   Sepsis Labs: @LABRCNTIP (procalcitonin:4,lacticidven:4)  ) Recent Results (from the past 240 hours)  Culture, blood (routine x 2)     Status: None (Preliminary  result)   Collection Time: 12/26/23  9:41 PM   Specimen: BLOOD  Result Value Ref Range Status   Specimen Description BLOOD SITE NOT SPECIFIED  Final   Special Requests   Final    BOTTLES DRAWN AEROBIC ONLY Blood Culture adequate volume   Culture   Final    NO GROWTH 3 DAYS Performed at [facility], [address], [place], [place] [address]    Report Status PENDING  Incomplete  Resp panel by RT-PCR (RSV, Flu A&B, Covid) Anterior Nasal Swab     Status: None   Collection Time: 12/26/23  9:47 PM   Specimen: Anterior Nasal Swab  Result Value Ref Range Status   SARS Coronavirus 2 by RT PCR NEGATIVE NEGATIVE Final   Influenza A by PCR NEGATIVE NEGATIVE Final   Influenza B by PCR NEGATIVE NEGATIVE Final    Comment: (NOTE) The Xpert Xpress SARS-CoV-2/FLU/RSV plus assay is intended as an aid in the diagnosis of influenza from Nasopharyngeal swab specimens and should not be used as a sole basis for treatment. Nasal washings and aspirates are unacceptable for Xpert Xpress SARS-CoV-2/FLU/RSV testing.  Fact Sheet for  Patients: bloggercourse.com  Fact Sheet for Healthcare Providers: seriousbroker.it  This test is not yet approved or cleared by the [place]  FDA and has been authorized for detection and/or diagnosis of SARS-CoV-2 by FDA under an Emergency Use Authorization (EUA). This EUA will remain in effect (meaning this test can be used) for the duration of the COVID-19 declaration under Section 564(b)(1) of the Act, 21 U.S.C. section 360bbb-3(b)(1), unless the authorization is terminated or revoked.     Resp Syncytial Virus by PCR NEGATIVE NEGATIVE Final    Comment: (NOTE) Fact Sheet for Patients: bloggercourse.com  Fact Sheet for Healthcare Providers: seriousbroker.it  This test is not yet approved or cleared by the [place]  FDA and has been authorized for detection and/or diagnosis of SARS-CoV-2 by FDA under an Emergency Use Authorization (EUA). This EUA will remain in effect (meaning this test can be used) for the duration of the COVID-19 declaration under Section 564(b)(1) of the Act, 21 U.S.C. section 360bbb-3(b)(1), unless the authorization is terminated or revoked.  Performed at [facility], [address] [place], [place] [address]   Respiratory (~20 pathogens) panel by PCR     Status: None   Collection Time: 12/27/23  1:45 AM   Specimen: Nasopharyngeal Swab; Respiratory  Result Value Ref Range Status   Adenovirus NOT DETECTED NOT DETECTED Final   Coronavirus 229E NOT DETECTED NOT DETECTED Final    Comment: (NOTE) The Coronavirus on the Respiratory Panel, DOES NOT test for the novel  Coronavirus (2019 nCoV)    Coronavirus HKU1 NOT DETECTED NOT DETECTED Final   Coronavirus NL63 NOT DETECTED NOT DETECTED Final   Coronavirus OC43 NOT DETECTED NOT DETECTED Final   Metapneumovirus NOT DETECTED NOT DETECTED Final   Rhinovirus / Enterovirus NOT DETECTED NOT  DETECTED Final   Influenza A NOT DETECTED NOT DETECTED Final   Influenza B NOT DETECTED NOT DETECTED Final   Parainfluenza Virus 1 NOT DETECTED NOT DETECTED Final   Parainfluenza Virus 2 NOT DETECTED NOT DETECTED Final   Parainfluenza Virus 3 NOT DETECTED NOT DETECTED Final   Parainfluenza Virus 4 NOT DETECTED NOT DETECTED Final   Respiratory Syncytial Virus NOT DETECTED NOT DETECTED Final   Bordetella pertussis NOT DETECTED NOT DETECTED Final   Bordetella Parapertussis NOT DETECTED NOT DETECTED Final   Chlamydophila pneumoniae NOT DETECTED NOT  DETECTED Final   Mycoplasma pneumoniae NOT DETECTED NOT DETECTED Final    Comment: Performed at [facility], [address] [place], [place] [address]  MRSA Next Gen by PCR, Nasal     Status: None   Collection Time: 12/27/23  2:22 AM   Specimen: Nasal Mucosa; Nasal Swab  Result Value Ref Range Status   MRSA by PCR Next Gen NOT DETECTED NOT DETECTED Final    Comment: (NOTE) The GeneXpert MRSA Assay (FDA approved for NASAL specimens only), is one component of a comprehensive MRSA colonization surveillance program. It is not intended to diagnose MRSA infection nor to guide or monitor treatment for MRSA infections. Test performance is not FDA approved in patients less than 23 years old. Performed at [facility], [address] [place], [place] [address]       Studies: No results found.  Scheduled Meds:  Chlorhexidine  Gluconate Cloth  6 each Topical Daily   dexamethasone  (DECADRON ) injection  4 mg Intravenous Q6H   levETIRAcetam   500 mg Intravenous Q12H   LORazepam   0.5 mg Intravenous Q6H   mouth rinse  15 mL Mouth Rinse 4 times per day    Continuous Infusions:  fentaNYL  infusion INTRAVENOUS       LOS: 2 days     Lebron JINNY Cage, MD [facility]  If 7PM-7AM, please contact night-coverage [email or web address] 12/29/2023, 10:12 AM

## 2023-12-29 NOTE — Plan of Care (Signed)
 Pt denies any pain. Slept most of the night, no signs of distress. Will continue to monitor. Daughter, [name] called for an update.  This RN explained that no update could be given at this time to anyone other than POA (pt's son). Problem: Activity: Goal: Risk for activity intolerance will decrease Outcome: Not Progressing   Problem: Coping: Goal: Level of anxiety will decrease Outcome: Progressing   Problem: Pain Managment: Goal: General experience of comfort will improve and/or be controlled Outcome: Progressing

## 2023-12-31 LAB — CULTURE, BLOOD (ROUTINE X 2)
Culture: NO GROWTH
Special Requests: ADEQUATE

## 2024-01-05 ENCOUNTER — Other Ambulatory Visit: Payer: Self-pay | Admitting: Family Medicine

## 2024-01-05 DIAGNOSIS — E1169 Type 2 diabetes mellitus with other specified complication: Secondary | ICD-10-CM

## 2024-01-05 DIAGNOSIS — N1832 Chronic kidney disease, stage 3b: Secondary | ICD-10-CM

## 2024-01-20 NOTE — Progress Notes (Signed)
 Patient daughter called  around [phone] to check on patient,and would like to be informed about any changes made on patient.

## 2024-01-20 NOTE — Progress Notes (Signed)
 Eye prep completed for pt eyes

## 2024-01-20 NOTE — Progress Notes (Signed)
 Wasted of fentanyl  with Eleanore, RN

## 2024-01-20 NOTE — Progress Notes (Signed)
   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 Palliative care requested to support conversations regarding goals of care - patient and his son had made the decision to pursue SNF with hospice care.   Today's Discussion January 09, 2024  *Please note that this is a verbal dictation therefore any spelling or grammatical errors are due to the Dragon Medical One system interpretation.  Chart reviewed inclusive of vital signs, progress notes, laboratory results, and diagnostic images. Fentanyl  gtt remains on at 50mcg/hr.   Upon assessment, Brian [name] is noted to have audible rattling, he has tachypnea, he has cool L finger though other extremities are warm. He appears to be in the more advance phases of the dying process.   I was able to provide Brian [name] with a bolus for distress.  Have spoken to RN who share they have been providing medications regularly for secretion burden and suctioning as needed.   Anticipate patient is likely to pass in house. There is no family at bedside this morning though I plan to call patients son to provide an update.   Questions and concerns addressed/Palliative Support Provided.   Objective Assessment: Vital Signs Vitals:   12/29/23 0652 01-09-2024 0624  BP: (!) 120/103 (!) 144/93  Pulse: (!) 137 (!) 101  Resp: 20   Temp: 97.7 F (36.5 C) (!) 97.5 F (36.4 C)  SpO2: 100% (!) 81%    Intake/Output Summary (Last 24 hours) at 09-Jan-2024 1207 Last data filed at 12/29/2023 1600 Gross per 24 hour  Intake 40.01 ml  Output --  Net 40.01 ml   Last Weight  Most recent update: 01/09/2024  6:53 AM    Weight  71 kg (156 lb 8.4 oz)            Gen: Elderly chronically ill-appearing Caucasian male HEENT: Dry  mucous membranes CV: Irregular rate and rhythm PULM: On RA, (+) notable rattle, breathing is labored ABD: soft/nontender EXT: No edema Neuro: Somnolent  SUMMARY OF RECOMMENDATIONS   DNAR/DNI   Comfort care   Continue  low-dose fentanyl  drip with boluses as needed patient and his son have both agreed   Additional medications per [facility]   Unrestricted visitation   I highly suspect patient is likely to pass today therefore will remain in house   Ongoing palliative care support  ______________________________________________________________________________________ [name] Becton [facility] Palliative Medicine Team Team Cell Phone: [phone] Please utilize secure chat with additional questions, if there is no response within 30 minutes please call the above phone number  Billing based on MDM: High - Parenteral opoid titration, bolus, and review of dose.   Palliative Medicine Team providers are available by phone from 7am to 7pm daily and can be reached through the team cell phone.  Should this patient require assistance outside of these hours, please call the patient's attending physician.

## 2024-01-20 NOTE — Progress Notes (Signed)
Pt being transported to morgue.

## 2024-01-20 NOTE — TOC CAGE-AID Note (Signed)
 Transition of Care [facility]) - CAGE-AID Screening   Patient Details  Name: Brian [name] MRN: [id number] Date of Birth: [birth year]  Transition of Care [facility]) CM/SW Contact:    Elody Kleinsasser E Majesty Stehlin, [name] Phone Number: Jan 04, 2024, 9:43 AM   Clinical Narrative: N/A patient is comfort care   CAGE-AID Screening: Substance Abuse Screening unable to be completed due to: :  (N/A)

## 2024-01-20 NOTE — Death Summary Note (Signed)
 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 In the ED, CXR w/ large left pleural effusion; similar to that seen on CT of chest September 02, 2023. Placed on bipap for wob. Hgb stable 10.2. EKG w/ afib RVR. Trop 42 and bnp 991. Started on cardizem . Cards consulted. LA 2.7. Cultures obtained and started on broad spectrum abx. CT head/c-spine no acute bleed or fracture; Large 3.3 cm suspected mass in anterior aspect of right middle cranial fossa; potential meningioma; exuberant edema in adjacent temporal lobe. PCCM admitted in the ICU for further management.  While in the ICU, patient was found to have acute PE in addition with A-fib with RVR and a large left pleural effusion.  Goals of care family meeting on 11/7 was held with POA son Quin who agreed to switch patient to DNR with comfort care. [facility] hospitalist assumed care on 2024-01-16.  Patient passed away on 01-17-24.     Assessment and Plan:  Acute respiratory failure w/ hypoxia  Large left pleural effusion Acute PE in the setting of malignancy Likely 2/2 acute PE in addition to large left pleural effusion CTA chest showed PE within the proximal left lower lobe pulmonary artery consistent with acute or subacute thrombus.  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 In the setting of history of stage IV squamous cell carcinoma of the tongue with metastases to lung, pleura CT head with new large suspected mass in the anterior aspect of the right middle cranial fossa not well-characterized MRI brain showed multiple brain lesions most likely metastatic with associated vasogenic edema, largest is 3 x 4 x 3.6 cm mass in the right anterior temporal lobe Switched to comfort care   Fall vs syncope Multifactorial due to above   Hx of seizure disorder   Hx of cva w/ left hemiparesis   CKD stage IIIb   Anemia of CKD      Procedures: None  Consultations: Cardiology, PCCM  The results of significant diagnostics from this hospitalization (including imaging, microbiology, ancillary and laboratory) are listed below for reference.   Significant Diagnostic Studies: CT Angio Chest Pulmonary Embolism (PE) W or WO Contrast Result Date: 12/27/2023 EXAM: CTA of the Chest with contrast for PE 09/02/2023 12:29:00 PM . TECHNIQUE: CTA of the chest was performed after the administration of 150 mL of iohexol  (OMNIPAQUE ) 350 MG/ML injection. Multiplanar reformatted images are provided for review. MIP images are provided for review. Automated exposure control, iterative reconstruction, and/or weight based adjustment of the mA/kV  was utilized to reduce the radiation dose to as low as reasonably achievable. COMPARISON: Prior study from July 2025. CLINICAL HISTORY: Pulmonary embolism (PE) suspected, high prob. FINDINGS: PULMONARY ARTERIES: Pulmonary arteries are adequately opacified for evaluation. There is a filling defect within the proximal left lower lobe pulmonary artery on  image 65 of series 7 and image 73 of series 7. These filling defects are partially wall adherent. Main pulmonary artery is normal in caliber. MEDIASTINUM: The heart and pericardium demonstrate no acute abnormality. There is no acute abnormality of the thoracic aorta. LYMPH NODES: No mediastinal, hilar or axillary lymphadenopathy. LUNGS AND PLEURA: There is dense atelectasis of the left lower lobe and lingula similar to prior. Large left pleural effusion occupying approximately 75% of the left hemithorax volume. The aerated left upper lobe has patchy airspace disease. Moderate sized layering right pleural effusion. Mild airspace disease in the right lower lobe and middle lobe. The larger left pleural effusion is increased in the interval from July. The right pleural effusion is new. UPPER ABDOMEN: Limited images of the upper abdomen are unremarkable. SOFT TISSUES AND BONES: No acute bone or soft tissue abnormality. IMPRESSION: 1. Pulmonary embolism within the proximal left lower lobe pulmonary artery, partially wall-adherent, consistent with acute or subacute thrombus. 2. Dense atelectasis in the left lower lobe and lingula. 3. Large left pleural effusion occupying approximately 75% of the left hemithorax volume, increased since the prior study. 4. Moderate right pleural effusion, new since the prior study. 5. These results will be called to the ordering clinician or representative by the radiologist assistant, and communication documented in the pacs or clario dashboard Electronically signed by: Norleen Boxer MD 12/27/2023 02:49 PM EST RP Workstation: [id number]   MR BRAIN W WO CONTRAST Result Date: 12/27/2023 CLINICAL DATA:  Stage IV squamous cell carcinoma of the tongue, left hemiparesis EXAM: MRI HEAD WITHOUT AND WITH CONTRAST TECHNIQUE: Multiplanar, multiecho pulse sequences of the brain and surrounding structures were obtained without and with intravenous contrast. CONTRAST:  7mL GADAVIST GADOBUTROL 1 MMOL/ML  IV SOLN COMPARISON:  January 07, 2023 FINDINGS: MRI brain: 1. There is a 3 x 4 x 3.6 cm mass in the right anterior temporal lobe with both dural-based and parenchymal components. This is associated with significant vasogenic edema. This is most likely a metastatic lesion 2. There is a 2.1 x 1.6 by 1.8 cm right frontal metastasis 3. There is a 1 cm left parietal metastasis 4. There is a large old infarct in the right parietal lobe with encephalomalacia 5. Extensive chronic white matter disease There is no acute infarct The ventricles are normal. There are normal flow signals in the carotid arteries and basilar artery. No significant bone marrow signal abnormality. No significant abnormality in the paranasal sinuses or soft tissues. IMPRESSION: 1. There is a 3 x 4 x 3.6 cm mass in the right anterior temporal lobe with both dural-based and parenchymal components. This is associated with significant vasogenic edema. This is most consistent with a metastatic lesion given the other brain lesions 2. There is a 2.1 x 1.6 by 1.8 cm right frontal metastasis 3. There is a 1 cm left parietal metastasis 4. There is a large old infarct in the right parietal lobe with encephalomalacia 5. Extensive chronic white matter disease Electronically Signed   By: Nancyann Burns M.D.   On: 12/27/2023 13:13   CT Head Wo Contrast Result Date: 12/27/2023 EXAM: CT HEAD AND CERVICAL SPINE 12/27/2023 12:53:25 AM TECHNIQUE: CT of the head and cervical  spine was performed without the administration of intravenous contrast. Multiplanar reformatted images are provided for review. Automated exposure control, iterative reconstruction, and/or weight based adjustment of the mA/kV was utilized to reduce the radiation dose to as low as reasonably achievable. COMPARISON: None available. CLINICAL HISTORY: Polytrauma, blunt; fall on thinners FINDINGS: CT HEAD BRAIN AND VENTRICLES: No acute intracranial hemorrhage. Large (3.3 cm) suspected mass in the  anterior aspect of the right middle cranial fossa, likely extra axial and potentially a meningioma. Exuberant edema in the adjacent temporal lobe. No abnormal extra-axial fluid collection. No evidence of acute infarct. Remote bilateral parietal infarcts. Patchy white matter hypodensities compatible with chronic microvascular ischemic change. No hydrocephalus. ORBITS: No acute abnormality. SINUSES AND MASTOIDS: Clear sinuses. Right mastoid effusion. SOFT TISSUES AND SKULL: No acute skull fracture. No acute soft tissue abnormality. CT CERVICAL SPINE BONES AND ALIGNMENT: No acute fracture or traumatic malalignment. DEGENERATIVE CHANGES: Severe multilevel degenerative changes including degenerative disc disease and facet and uncovertebral hypertrophy with varying degrees of neural foraminal stenosis. Likely moderate canal stenosis at C5-C6. SOFT TISSUES: No prevertebral soft tissue swelling. IMPRESSION: 1. Large (3.3 cm) suspected mass in the anterior aspect of the right middle cranial fossa, potentially a meningioma but not well characterized. Exuberant edema in the adjacent temporal lobe. Recommend MRI head with contrast to confirm and further evaluate. 2. No acute traumatic intracranial abnormality. 3. No acute fracture or traumatic malalignment of the cervical spine. Electronically signed by: Gilmore Molt MD 12/27/2023 01:13 AM EST RP Workstation: [id number]   CT Cervical Spine Wo Contrast Result Date: 12/27/2023 EXAM: CT HEAD AND CERVICAL SPINE 12/27/2023 12:53:25 AM TECHNIQUE: CT of the head and cervical spine was performed without the administration of intravenous contrast. Multiplanar reformatted images are provided for review. Automated exposure control, iterative reconstruction, and/or weight based adjustment of the mA/kV was utilized to reduce the radiation dose to as low as reasonably achievable. COMPARISON: None available. CLINICAL HISTORY: Polytrauma, blunt; fall on thinners FINDINGS: CT HEAD BRAIN  AND VENTRICLES: No acute intracranial hemorrhage. Large (3.3 cm) suspected mass in the anterior aspect of the right middle cranial fossa, likely extra axial and potentially a meningioma. Exuberant edema in the adjacent temporal lobe. No abnormal extra-axial fluid collection. No evidence of acute infarct. Remote bilateral parietal infarcts. Patchy white matter hypodensities compatible with chronic microvascular ischemic change. No hydrocephalus. ORBITS: No acute abnormality. SINUSES AND MASTOIDS: Clear sinuses. Right mastoid effusion. SOFT TISSUES AND SKULL: No acute skull fracture. No acute soft tissue abnormality. CT CERVICAL SPINE BONES AND ALIGNMENT: No acute fracture or traumatic malalignment. DEGENERATIVE CHANGES: Severe multilevel degenerative changes including degenerative disc disease and facet and uncovertebral hypertrophy with varying degrees of neural foraminal stenosis. Likely moderate canal stenosis at C5-C6. SOFT TISSUES: No prevertebral soft tissue swelling. IMPRESSION: 1. Large (3.3 cm) suspected mass in the anterior aspect of the right middle cranial fossa, potentially a meningioma but not well characterized. Exuberant edema in the adjacent temporal lobe. Recommend MRI head with contrast to confirm and further evaluate. 2. No acute traumatic intracranial abnormality. 3. No acute fracture or traumatic malalignment of the cervical spine. Electronically signed by: Gilmore Molt MD 12/27/2023 01:13 AM EST RP Workstation: [id number]   DG Chest Portable 1 View Result Date: 12/26/2023 CLINICAL DATA:  History of lung cancer, status post fall. EXAM: PORTABLE CHEST 1 VIEW COMPARISON:  November 24, 2023 FINDINGS: The cardiac silhouette is stable in size and appearance. A large left pleural effusion is seen. This is mildly decreased in size when  compared to the prior study. Marked severity left perihilar airspace disease is seen. This is limited in evaluation secondary to overlying defibrillator pads. No  pneumothorax is identified. Degenerative changes are present throughout the thoracic spine. IMPRESSION: 1. Large left pleural effusion, mildly decreased in size when compared to the prior study. 2. Marked severity left perihilar airspace disease which likely corresponds to findings seen within this region on the prior chest CT (dated September 02, 2023). While this may represent sequelae associated with post radiation changes, an infectious and/or neoplastic process cannot be excluded. Electronically Signed   By: Suzen Dials M.D.   On: 12/26/2023 21:37   DG Pelvis Portable Result Date: 12/26/2023 CLINICAL DATA:  Status post fall. EXAM: PORTABLE PELVIS 1-2 VIEWS COMPARISON:  None Available. FINDINGS: There is no evidence of pelvic fracture or diastasis. No pelvic bone lesions are seen. IMPRESSION: Negative. Electronically Signed   By: Suzen Dials M.D.   On: 12/26/2023 21:27   DG Knee 2 Views Left Result Date: 12/26/2023 CLINICAL DATA:  Status post fall. EXAM: LEFT KNEE - 1-2 VIEW COMPARISON:  None Available. FINDINGS: No evidence of an acute fracture, dislocation, or joint effusion. Marked severity tricompartmental degenerative changes are seen, most prominently involving the medial tibiofemoral compartment space. There is marked severity vascular calcification. IMPRESSION: Marked severity tricompartmental degenerative changes. Electronically Signed   By: Suzen Dials M.D.   On: 12/26/2023 21:26    Microbiology: Recent Results (from the past 240 hours)  Culture, blood (routine x 2)     Status: None (Preliminary result)   Collection Time: 12/26/23  9:41 PM   Specimen: BLOOD  Result Value Ref Range Status   Specimen Description BLOOD SITE NOT SPECIFIED  Final   Special Requests   Final    BOTTLES DRAWN AEROBIC ONLY Blood Culture adequate volume   Culture   Final    NO GROWTH 4 DAYS Performed at [facility], [address], [place], [place] [address]    Report Status PENDING   Incomplete  Resp panel by RT-PCR (RSV, Flu A&B, Covid) Anterior Nasal Swab     Status: None   Collection Time: 12/26/23  9:47 PM   Specimen: Anterior Nasal Swab  Result Value Ref Range Status   SARS Coronavirus 2 by RT PCR NEGATIVE NEGATIVE Final   Influenza A by PCR NEGATIVE NEGATIVE Final   Influenza B by PCR NEGATIVE NEGATIVE Final    Comment: (NOTE) The Xpert Xpress SARS-CoV-2/FLU/RSV plus assay is intended as an aid in the diagnosis of influenza from Nasopharyngeal swab specimens and should not be used as a sole basis for treatment. Nasal washings and aspirates are unacceptable for Xpert Xpress SARS-CoV-2/FLU/RSV testing.  Fact Sheet for Patients: bloggercourse.com  Fact Sheet for Healthcare Providers: seriousbroker.it  This test is not yet approved or cleared by the [place]  FDA and has been authorized for detection and/or diagnosis of SARS-CoV-2 by FDA under an Emergency Use Authorization (EUA). This EUA will remain in effect (meaning this test can be used) for the duration of the COVID-19 declaration under Section 564(b)(1) of the Act, 21 U.S.C. section 360bbb-3(b)(1), unless the authorization is terminated or revoked.     Resp Syncytial Virus by PCR NEGATIVE NEGATIVE Final    Comment: (NOTE) Fact Sheet for Patients: bloggercourse.com  Fact Sheet for Healthcare Providers: seriousbroker.it  This test is not yet approved or cleared by the [place]  FDA and has been authorized for detection and/or diagnosis of SARS-CoV-2 by FDA under an  Emergency Use Authorization (EUA). This EUA will remain in effect (meaning this test can be used) for the duration of the COVID-19 declaration under Section 564(b)(1) of the Act, 21 U.S.C. section 360bbb-3(b)(1), unless the authorization is terminated or revoked.  Performed at [facility], [address]  [place], [place] [address]   Respiratory (~20 pathogens) panel by PCR     Status: None   Collection Time: 12/27/23  1:45 AM   Specimen: Nasopharyngeal Swab; Respiratory  Result Value Ref Range Status   Adenovirus NOT DETECTED NOT DETECTED Final   Coronavirus 229E NOT DETECTED NOT DETECTED Final    Comment: (NOTE) The Coronavirus on the Respiratory Panel, DOES NOT test for the novel  Coronavirus (2019 nCoV)    Coronavirus HKU1 NOT DETECTED NOT DETECTED Final   Coronavirus NL63 NOT DETECTED NOT DETECTED Final   Coronavirus OC43 NOT DETECTED NOT DETECTED Final   Metapneumovirus NOT DETECTED NOT DETECTED Final   Rhinovirus / Enterovirus NOT DETECTED NOT DETECTED Final   Influenza A NOT DETECTED NOT DETECTED Final   Influenza B NOT DETECTED NOT DETECTED Final   Parainfluenza Virus 1 NOT DETECTED NOT DETECTED Final   Parainfluenza Virus 2 NOT DETECTED NOT DETECTED Final   Parainfluenza Virus 3 NOT DETECTED NOT DETECTED Final   Parainfluenza Virus 4 NOT DETECTED NOT DETECTED Final   Respiratory Syncytial Virus NOT DETECTED NOT DETECTED Final   Bordetella pertussis NOT DETECTED NOT DETECTED Final   Bordetella Parapertussis NOT DETECTED NOT DETECTED Final   Chlamydophila pneumoniae NOT DETECTED NOT DETECTED Final   Mycoplasma pneumoniae NOT DETECTED NOT DETECTED Final    Comment: Performed at [facility], [address] [place], [place] [address]  MRSA Next Gen by PCR, Nasal     Status: None   Collection Time: 12/27/23  2:22 AM   Specimen: Nasal Mucosa; Nasal Swab  Result Value Ref Range Status   MRSA by PCR Next Gen NOT DETECTED NOT DETECTED Final    Comment: (NOTE) The GeneXpert MRSA Assay (FDA approved for NASAL specimens only), is one component of a comprehensive MRSA colonization surveillance program. It is not intended to diagnose MRSA infection nor to guide or monitor treatment for MRSA infections. Test performance is not FDA approved in patients less than 18  years old. Performed at [facility], [address], [place], [place] [address]     Time spent: 20 minutes  Signed: Lebron JINNY Cage, MD 01/01/24

## 2024-01-20 DEATH — deceased

## 2024-02-04 ENCOUNTER — Ambulatory Visit (HOSPITAL_COMMUNITY): Admit: 2024-02-04 | Admitting: Gastroenterology

## 2024-02-04 SURGERY — EGD, WITH DILATION USING SAVARY-GILLIARD DILATOR OVER GUIDEWIRE
Anesthesia: Monitor Anesthesia Care

## 2024-04-23 ENCOUNTER — Ambulatory Visit: Payer: Medicare HMO | Admitting: Adult Health

## 2024-06-11 ENCOUNTER — Encounter
